# Patient Record
Sex: Female | Born: 1935 | Race: White | Hispanic: No | Smoking: Never smoker
Health system: Southern US, Community
[De-identification: ages and names within clinical notes are randomized; demographics above are authoritative.]

## PROBLEM LIST (undated history)

## (undated) DIAGNOSIS — I499 Cardiac arrhythmia, unspecified: Secondary | ICD-10-CM

## (undated) DIAGNOSIS — I358 Other nonrheumatic aortic valve disorders: Secondary | ICD-10-CM

## (undated) DIAGNOSIS — Z87442 Personal history of urinary calculi: Secondary | ICD-10-CM

## (undated) DIAGNOSIS — I509 Heart failure, unspecified: Secondary | ICD-10-CM

## (undated) DIAGNOSIS — H9190 Unspecified hearing loss, unspecified ear: Secondary | ICD-10-CM

## (undated) DIAGNOSIS — M489 Spondylopathy, unspecified: Secondary | ICD-10-CM

## (undated) DIAGNOSIS — I639 Cerebral infarction, unspecified: Secondary | ICD-10-CM

## (undated) DIAGNOSIS — D496 Neoplasm of unspecified behavior of brain: Secondary | ICD-10-CM

## (undated) DIAGNOSIS — I272 Pulmonary hypertension, unspecified: Secondary | ICD-10-CM

## (undated) DIAGNOSIS — R42 Dizziness and giddiness: Secondary | ICD-10-CM

## (undated) DIAGNOSIS — E785 Hyperlipidemia, unspecified: Secondary | ICD-10-CM

## (undated) DIAGNOSIS — C4491 Basal cell carcinoma of skin, unspecified: Secondary | ICD-10-CM

## (undated) DIAGNOSIS — I38 Endocarditis, valve unspecified: Secondary | ICD-10-CM

## (undated) DIAGNOSIS — K2210 Ulcer of esophagus without bleeding: Secondary | ICD-10-CM

## (undated) DIAGNOSIS — K55059 Acute (reversible) ischemia of intestine, part and extent unspecified: Secondary | ICD-10-CM

## (undated) DIAGNOSIS — M1990 Unspecified osteoarthritis, unspecified site: Secondary | ICD-10-CM

## (undated) DIAGNOSIS — D649 Anemia, unspecified: Secondary | ICD-10-CM

## (undated) DIAGNOSIS — K222 Esophageal obstruction: Secondary | ICD-10-CM

## (undated) DIAGNOSIS — Z931 Gastrostomy status: Secondary | ICD-10-CM

## (undated) DIAGNOSIS — E119 Type 2 diabetes mellitus without complications: Secondary | ICD-10-CM

## (undated) DIAGNOSIS — K219 Gastro-esophageal reflux disease without esophagitis: Secondary | ICD-10-CM

## (undated) DIAGNOSIS — N200 Calculus of kidney: Secondary | ICD-10-CM

## (undated) DIAGNOSIS — D329 Benign neoplasm of meninges, unspecified: Secondary | ICD-10-CM

## (undated) DIAGNOSIS — I10 Essential (primary) hypertension: Secondary | ICD-10-CM

## (undated) DIAGNOSIS — G2581 Restless legs syndrome: Secondary | ICD-10-CM

## (undated) HISTORY — PX: EYE SURGERY: SHX253

## (undated) HISTORY — PX: BACK SURGERY: SHX140

## (undated) HISTORY — PX: ABDOMINAL HYSTERECTOMY: SHX81

## (undated) HISTORY — DX: Unspecified hearing loss, unspecified ear: H91.90

## (undated) HISTORY — PX: ARTHROGRAM KNEE: SUR67

## (undated) HISTORY — PX: HYSTERECTOMY ABDOMINAL WITH SALPINGECTOMY: SHX6725

## (undated) HISTORY — DX: Cerebral infarction, unspecified: I63.9

## (undated) HISTORY — PX: ESOPHAGUS SURGERY: SHX626

## (undated) HISTORY — DX: Heart failure, unspecified: I50.9

## (undated) HISTORY — PX: APPENDECTOMY: SHX54

## (undated) SURGERY — Surgical Case
Anesthesia: *Unknown

---

## 2000-07-01 ENCOUNTER — Encounter: Payer: Self-pay | Admitting: Neurosurgery

## 2000-07-02 ENCOUNTER — Inpatient Hospital Stay (HOSPITAL_COMMUNITY): Admission: RE | Admit: 2000-07-02 | Discharge: 2000-07-03 | Payer: Self-pay | Admitting: Neurosurgery

## 2000-07-02 ENCOUNTER — Encounter: Payer: Self-pay | Admitting: Neurosurgery

## 2000-07-25 ENCOUNTER — Ambulatory Visit (HOSPITAL_COMMUNITY): Admission: RE | Admit: 2000-07-25 | Discharge: 2000-07-25 | Payer: Self-pay | Admitting: Neurosurgery

## 2000-07-25 ENCOUNTER — Encounter: Payer: Self-pay | Admitting: Neurosurgery

## 2005-02-16 ENCOUNTER — Ambulatory Visit: Payer: Self-pay | Admitting: Family Medicine

## 2005-06-20 ENCOUNTER — Ambulatory Visit: Payer: Self-pay | Admitting: Internal Medicine

## 2006-06-05 ENCOUNTER — Ambulatory Visit: Payer: Self-pay

## 2006-07-16 ENCOUNTER — Ambulatory Visit: Payer: Self-pay | Admitting: Internal Medicine

## 2007-05-09 ENCOUNTER — Ambulatory Visit: Payer: Self-pay | Admitting: Gastroenterology

## 2007-05-23 ENCOUNTER — Ambulatory Visit: Payer: Self-pay | Admitting: Gastroenterology

## 2007-07-24 ENCOUNTER — Ambulatory Visit: Payer: Self-pay | Admitting: Internal Medicine

## 2008-01-31 ENCOUNTER — Ambulatory Visit: Payer: Self-pay | Admitting: Internal Medicine

## 2008-06-08 ENCOUNTER — Ambulatory Visit: Payer: Self-pay | Admitting: Internal Medicine

## 2009-01-06 ENCOUNTER — Ambulatory Visit: Payer: Self-pay | Admitting: Internal Medicine

## 2010-01-09 ENCOUNTER — Ambulatory Visit: Payer: Self-pay | Admitting: Internal Medicine

## 2010-02-13 ENCOUNTER — Ambulatory Visit: Payer: Self-pay | Admitting: Internal Medicine

## 2011-04-16 ENCOUNTER — Ambulatory Visit: Payer: Self-pay | Admitting: Internal Medicine

## 2012-05-28 DIAGNOSIS — Z85828 Personal history of other malignant neoplasm of skin: Secondary | ICD-10-CM | POA: Insufficient documentation

## 2013-04-20 ENCOUNTER — Ambulatory Visit: Payer: Self-pay | Admitting: Internal Medicine

## 2013-07-27 ENCOUNTER — Ambulatory Visit: Payer: Self-pay | Admitting: Internal Medicine

## 2013-12-25 DIAGNOSIS — M1710 Unilateral primary osteoarthritis, unspecified knee: Secondary | ICD-10-CM | POA: Insufficient documentation

## 2013-12-25 DIAGNOSIS — E785 Hyperlipidemia, unspecified: Secondary | ICD-10-CM | POA: Insufficient documentation

## 2013-12-25 DIAGNOSIS — E119 Type 2 diabetes mellitus without complications: Secondary | ICD-10-CM | POA: Insufficient documentation

## 2013-12-25 DIAGNOSIS — I6789 Other cerebrovascular disease: Secondary | ICD-10-CM | POA: Insufficient documentation

## 2013-12-25 DIAGNOSIS — I10 Essential (primary) hypertension: Secondary | ICD-10-CM | POA: Insufficient documentation

## 2013-12-25 DIAGNOSIS — E1169 Type 2 diabetes mellitus with other specified complication: Secondary | ICD-10-CM | POA: Insufficient documentation

## 2014-02-19 DIAGNOSIS — I358 Other nonrheumatic aortic valve disorders: Secondary | ICD-10-CM | POA: Insufficient documentation

## 2014-12-13 DIAGNOSIS — R600 Localized edema: Secondary | ICD-10-CM | POA: Insufficient documentation

## 2014-12-13 DIAGNOSIS — M159 Polyosteoarthritis, unspecified: Secondary | ICD-10-CM | POA: Insufficient documentation

## 2014-12-23 ENCOUNTER — Ambulatory Visit: Payer: Self-pay | Admitting: Ophthalmology

## 2014-12-23 LAB — POTASSIUM: Potassium: 4.1 mmol/L

## 2014-12-23 LAB — HEMOGLOBIN: HGB: 12.3 g/dL (ref 12.0–16.0)

## 2015-01-04 DIAGNOSIS — I272 Pulmonary hypertension, unspecified: Secondary | ICD-10-CM | POA: Insufficient documentation

## 2015-01-06 ENCOUNTER — Ambulatory Visit
Admit: 2015-01-06 | Disposition: A | Discharge status: Home / Self Care | Payer: Self-pay | Attending: Ophthalmology | Admitting: Ophthalmology

## 2015-01-30 NOTE — Op Note (Signed)
PATIENT NAME:  [name] MR#:  [id number] DATE OF BIRTH:  07-29-1929  DATE OF PROCEDURE:  01/06/2015  PROCEDURE: Cataract surgery on the right eye.   ANESTHESIA: Topical.   PREOPERATIVE DIAGNOSIS:  Nuclear sclerotic cataract, right eye. Diagnosis code: H 25.11  POSTOPERATIVE DIAGNOSIS:  Nuclear sclerotic cataract, right eye. Diagnosis code: H 25.11  PROCEDURE:  Phacoemulsification with posterior chamber intraocular lens right eye, model SN60WF  SURGEON:  Lyla Glassing, MD  INDICATIONS:  This is an 81 year old female with decreased vision in the right eye.  PROCEDURE:  The risks and benefits of cataract surgery were discussed at length with the patient, including bleeding, infection, retinal detachment, re-operation, diplopia, ptosis, loss of vision, and loss of the eye. Informed consent was obtained. On the day of surgery, several sets of preoperative medication were administered to the operative eye including 0.5% tetracaine,1% cyclopentolate, 10% phenylephrine, 0.5% ketorolac, 0.5% gatifloxacin, and 2% lidocaine .  The patient was taken to the operating room and sedated via IV sedation. Topical tetracaine was placed in the eye. The operative eye was prepped using a dilute 10% Betadine solution and then covered in sterile drapes leaving only the operative eye exposed. A Lieberman lid speculum was placed to provide exposure. Using 0.12 forceps and a sideport blade, a paracentesis was created. Then a mixture of BSS, preservative free lidocaine, and epinephrine was injected into the anterior chamber. Next, a 2.4 mm keratome blade was used to create a two-step full-thickness clear corneal incision temporally. The cystitome and Utrata forceps were used to create a continuous capsulorrhexis in the anterior lens capsule. BSS on a hydrodissection cannula was used to perform gentle hydrodissection. Phacoemulsification was then performed to remove the nucleus. Irrigation and aspiration was performed  to remove the remaining cortical material. Provisc was injected to fill the capsular bag and anterior chamber. A 22.0-diopter SN60WF intraocular lens was injected into the capsular bag. The Connor wand was used to rotate it into proper position in the capsular bag. Irrigation and aspiration was performed to remove the remaining Viscoelastic material from the eye. BSS on a 30-gauge cannula was used to hydrate the wound. An intracameral antibiotic was administered. The wounds were checked and found to be watertight. The lid speculum and drapes were carefully removed. Several drops of Vigamox were placed in the operative eye. The eye was covered with protective eyewear. The patient was taken to the recovery area in good condition. There were no complications.    ____________________________ Lyla Glassing, MD nm:mw D: 01/06/2015 10:14:28 ET T: 01/06/2015 12:51:44 ET JOB#: [id number]  cc: Lyla Glassing, MD, <Dictator> Lyla Glassing MD ELECTRONICALLY SIGNED 01/10/2015 9:19

## 2015-03-14 ENCOUNTER — Ambulatory Visit: Payer: Medicare Other | Admitting: Anesthesiology

## 2015-03-14 ENCOUNTER — Encounter
Admission: RE | Disposition: A | Discharge status: Home / Self Care | Payer: Self-pay | Source: Ambulatory Visit | Attending: Gastroenterology

## 2015-03-14 ENCOUNTER — Ambulatory Visit
Admission: RE | Admit: 2015-03-14 | Discharge: 2015-03-14 | Disposition: A | Discharge status: Home / Self Care | Payer: Medicare Other | Source: Ambulatory Visit | Attending: Gastroenterology | Admitting: Gastroenterology

## 2015-03-14 DIAGNOSIS — R1013 Epigastric pain: Secondary | ICD-10-CM | POA: Diagnosis present

## 2015-03-14 DIAGNOSIS — Z8673 Personal history of transient ischemic attack (TIA), and cerebral infarction without residual deficits: Secondary | ICD-10-CM | POA: Insufficient documentation

## 2015-03-14 DIAGNOSIS — E119 Type 2 diabetes mellitus without complications: Secondary | ICD-10-CM | POA: Insufficient documentation

## 2015-03-14 DIAGNOSIS — K259 Gastric ulcer, unspecified as acute or chronic, without hemorrhage or perforation: Secondary | ICD-10-CM | POA: Diagnosis not present

## 2015-03-14 DIAGNOSIS — E785 Hyperlipidemia, unspecified: Secondary | ICD-10-CM | POA: Insufficient documentation

## 2015-03-14 DIAGNOSIS — R600 Localized edema: Secondary | ICD-10-CM | POA: Diagnosis not present

## 2015-03-14 DIAGNOSIS — K210 Gastro-esophageal reflux disease with esophagitis: Secondary | ICD-10-CM | POA: Diagnosis not present

## 2015-03-14 DIAGNOSIS — I272 Other secondary pulmonary hypertension: Secondary | ICD-10-CM | POA: Insufficient documentation

## 2015-03-14 DIAGNOSIS — Z87442 Personal history of urinary calculi: Secondary | ICD-10-CM | POA: Insufficient documentation

## 2015-03-14 DIAGNOSIS — D649 Anemia, unspecified: Secondary | ICD-10-CM | POA: Diagnosis not present

## 2015-03-14 DIAGNOSIS — Z85828 Personal history of other malignant neoplasm of skin: Secondary | ICD-10-CM | POA: Diagnosis not present

## 2015-03-14 DIAGNOSIS — M1990 Unspecified osteoarthritis, unspecified site: Secondary | ICD-10-CM | POA: Diagnosis not present

## 2015-03-14 HISTORY — DX: Neoplasm of unspecified behavior of brain: D49.6

## 2015-03-14 HISTORY — PX: ESOPHAGOGASTRODUODENOSCOPY (EGD) WITH PROPOFOL: SHX5813

## 2015-03-14 HISTORY — DX: Spondylopathy, unspecified: M48.9

## 2015-03-14 HISTORY — DX: Hyperlipidemia, unspecified: E78.5

## 2015-03-14 HISTORY — DX: Type 2 diabetes mellitus without complications: E11.9

## 2015-03-14 HISTORY — DX: Cardiac arrhythmia, unspecified: I49.9

## 2015-03-14 HISTORY — DX: Benign neoplasm of meninges, unspecified: D32.9

## 2015-03-14 HISTORY — DX: Calculus of kidney: N20.0

## 2015-03-14 HISTORY — DX: Essential (primary) hypertension: I10

## 2015-03-14 HISTORY — DX: Endocarditis, valve unspecified: I38

## 2015-03-14 HISTORY — DX: Anemia, unspecified: D64.9

## 2015-03-14 LAB — GLUCOSE, CAPILLARY: Glucose-Capillary: 103 mg/dL — ABNORMAL HIGH (ref 65–99)

## 2015-03-14 SURGERY — ESOPHAGOGASTRODUODENOSCOPY (EGD) WITH PROPOFOL
Anesthesia: General

## 2015-03-14 SURGERY — Surgical Case
Anesthesia: *Unknown

## 2015-03-14 MED ORDER — FENTANYL CITRATE (PF) 100 MCG/2ML IJ SOLN
INTRAMUSCULAR | Status: DC | PRN
  Administered 2015-03-14: 30 ug via INTRAVENOUS
  Administered 2015-03-14: 50 ug via INTRAVENOUS
  Administered 2015-03-14: 30 ug via INTRAVENOUS

## 2015-03-14 MED ORDER — METOPROLOL SUCCINATE ER 50 MG PO TB24
50.0000 mg | ORAL_TABLET | Freq: Once | ORAL | Status: AC
  Administered 2015-03-14: 50 mg via ORAL

## 2015-03-14 MED ORDER — MIDAZOLAM HCL 5 MG/5ML IJ SOLN: INTRAMUSCULAR | Status: DC | PRN

## 2015-03-14 MED ORDER — PROPOFOL 10 MG/ML IV BOLUS
INTRAVENOUS | Status: DC | PRN
  Administered 2015-03-14: 50 mg via INTRAVENOUS

## 2015-03-14 MED ORDER — SODIUM CHLORIDE 0.9 % IV SOLN
INTRAVENOUS | Status: DC
  Administered 2015-03-14: 1000 mL via INTRAVENOUS

## 2015-03-14 MED ORDER — SODIUM CHLORIDE 0.9 % IV SOLN: INTRAVENOUS | Status: DC

## 2015-03-14 MED ORDER — METOPROLOL SUCCINATE ER 50 MG PO TB24
ORAL_TABLET | ORAL | Status: AC
  Filled 2015-03-14: qty 1

## 2015-03-14 MED ORDER — MIDAZOLAM HCL 5 MG/5ML IJ SOLN
INTRAMUSCULAR | Status: DC | PRN
  Administered 2015-03-14: 1 mg via INTRAVENOUS

## 2015-03-14 NOTE — Anesthesia Postprocedure Evaluation (Signed)
  Anesthesia Post-op Note  Patient: [name] Day  Procedure(s) Performed: Procedure(s): ESOPHAGOGASTRODUODENOSCOPY (EGD) WITH PROPOFOL (N/A)  Anesthesia type:General  Patient location: PACU  Post pain: Pain level controlled  Post assessment: Post-op Vital signs reviewed, Patient's Cardiovascular Status Stable, Respiratory Function Stable, Patent Airway and No signs of Nausea or vomiting  Post vital signs: Reviewed and stable  Last Vitals:  Filed Vitals:   03/14/15 0753  BP: 182/55  Pulse: 59  Temp: 36.4 C  Resp: 16    Level of consciousness: awake, alert  and patient cooperative  Complications: No apparent anesthesia complications

## 2015-03-14 NOTE — H&P (Signed)
Primary Care Physician:  [name], MD  Pre-Procedure History & Physical: HPI:  [name] [name] is a 81 y.o. female is here for an endoscopy.   Past Medical History  Diagnosis Date  . Hypertension   . Diabetes mellitus without complication   . Kidney stones   . Hyperlipemia   . Cervical spine disease   . Meningioma   . Brain tumor   . Arrhythmia   . Leaky heart valve   . Brain tumor   . Anemia     Past Surgical History  Procedure Laterality Date  . Abdominal hysterectomy    . Eye surgery      Prior to Admission medications   Medication Sig Start Date End Date Taking? Authorizing Provider  acetaminophen (TYLENOL) 650 MG CR tablet Take 650 mg by mouth every 8 (eight) hours as needed for pain.   Yes Historical Provider, MD  aspirin 81 MG tablet Take 81 mg by mouth daily.   Yes Historical Provider, MD  Calcium Carbonate-Vitamin D (CALCIUM + D PO) Take 1 tablet by mouth daily.   Yes Historical Provider, MD  ferrous sulfate 325 (65 FE) MG tablet Take 325 mg by mouth daily with breakfast.   Yes Historical Provider, MD  furosemide (LASIX) 20 MG tablet Take 1 tablet by mouth daily. 01/03/15  Yes Historical Provider, MD  glipiZIDE (GLUCOTROL) 10 MG tablet Take 1 tablet by mouth 2 (two) times daily. 03/01/15  Yes Historical Provider, MD  HYDROcodone-acetaminophen (NORCO/VICODIN) 5-325 MG per tablet take 1 tablet by mouth once daily if needed for pain 12/14/14  Yes Historical Provider, MD  lisinopril (PRINIVIL,ZESTRIL) 40 MG tablet Take 1 tablet by mouth daily. 03/01/15  Yes Historical Provider, MD  meloxicam (MOBIC) 15 MG tablet Take 1 tablet by mouth daily. 03/01/15  Yes Historical Provider, MD  metFORMIN (GLUCOPHAGE) 500 MG tablet Take 500 mg by mouth 2 (two) times daily.  02/12/15  Yes Historical Provider, MD  metoprolol succinate (TOPROL-XL) 50 MG 24 hr tablet Take 1 tablet by mouth daily. 02/24/15  Yes Historical Provider, MD  omeprazole (PRILOSEC) 40 MG capsule Take 40 mg by  mouth daily. 03/09/15  Yes Historical Provider, MD  rOPINIRole (REQUIP) 2 MG tablet Take 2 mg by mouth 2 (two) times daily.  02/24/15  Yes Historical Provider, MD  vitamin B-12 (CYANOCOBALAMIN) 1000 MCG tablet Take 1,000 mcg by mouth daily.   Yes Historical Provider, MD  zolpidem (AMBIEN) 5 MG tablet Take 5 mg by mouth at bedtime as needed for sleep.   Yes Historical Provider, MD    Allergies as of 03/10/2015  . (Not on File)    History reviewed. No pertinent family history.  History   Social History  . Marital Status: Widowed    Spouse Name: N/A  . Number of Children: N/A  . Years of Education: N/A   Occupational History  . Not on file.   Social History Main Topics  . Smoking status: Not on file  . Smokeless tobacco: Not on file  . Alcohol Use: Not on file  . Drug Use: Not on file  . Sexual Activity: Not on file   Other Topics Concern  . Not on file   Social History Narrative  . No narrative on file     Physical Exam: BP 182/55 mmHg  Pulse 59  Temp(Src) 97.6 F (36.4 C) (Tympanic)  Resp 16  Ht 5\' 3"  (1.6 m)  Wt 125 lb (56.7 kg)  BMI 22.15 kg/m2  SpO2  100% General:   Alert,  pleasant and cooperative in NAD Head:  Normocephalic and atraumatic. Neck:  Supple; no masses or thyromegaly. Lungs:  Clear throughout to auscultation.    Heart:  Regular rate and rhythm. Abdomen:  Soft, nontender and nondistended. Normal bowel sounds, without guarding, and without rebound.   Neurologic:  Alert and  oriented x4;  grossly normal neurologically.  Impression/Plan: [name] is here for an endoscopy to be performed for dysphagia, epi pain, GERD  Risks, benefits, limitations, and alternatives regarding  endoscopy have been reviewed with the patient.  Questions have been answered.  All parties agreeable.   Josefine Class, MD  03/14/2015, 8:38 AM

## 2015-03-14 NOTE — Op Note (Signed)
[facility] Gastroenterology Patient Name: [name] Procedure Date: 03/14/2015 8:25 AM MRN: [id number] Account #: [id number] Date of Birth: 04-19-1929 Admit Type: Outpatient Age: 81 Room: [facility] ENDO ROOM 2 Gender: Female Note Status: Finalized Procedure:         Upper GI endoscopy Indications:       Epigastric abdominal pain, Dysphagia, Heartburn Patient Profile:   This is an 81 year old female. Providers:         Gerrit Heck. Rayann Heman, MD Referring MD:      Loreli Dollar, MD (Referring MD) Medicines:         Propofol per Anesthesia Complications:     No immediate complications. Procedure:         Pre-Anesthesia Assessment:                    - Prior to the procedure, a History and Physical was                     performed, and patient medications, allergies and                     sensitivities were reviewed. The patient's tolerance of                     previous anesthesia was reviewed.                    After obtaining informed consent, the endoscope was passed                     under direct vision. Throughout the procedure, the                     patient's blood pressure, pulse, and oxygen saturations                     were monitored continuously. The Olympus GIF H180J                     colonscope (X#:[id number]) was introduced through the mouth,                     and advanced to the second part of duodenum. The upper GI                     endoscopy was accomplished without difficulty. The patient                     tolerated the procedure well. Findings:      LA Grade A (one or more mucosal breaks less than 5 mm, not extending       between tops of 2 mucosal folds) esophagitis was found at the       gastroesophageal junction.      - Pill sitting at GEJ, passed with air. A TTS dilator was passed through       the scope. Dilation with a 15-16.5-18 mm x 5.5 cm CRE balloon (to a       maximum balloon size of 18 mm) dilator was performed.  Few superficial gastric ulcers were found in the gastric antrum. The       largest lesion was 3 mm in largest dimension. Biopsies were taken with a       cold forceps for histology.      The  examined duodenum was normal. Impression:        - Pill sitting at GEJ, passed with air.                    - LA Grade A reflux esophagitis. Dilated.                    - Gastric ulcers. Biopsied.                    - Normal examined duodenum. Recommendation:    - Observe patient in GI recovery unit.                    - Resume regular diet.                    - Continue present medications. Continue prilosec 40 mg                     daily, 30 min before breakfast                    - Avoid NSAIDS                    - Repeat the upper endoscopy PRN for retreatment.                    - Await pathology results.                    - Return to referring physician.                    - The findings and recommendations were discussed with the                     patient.                    - The findings and recommendations were discussed with the                     patient's family. Procedure Code(s): --- Professional ---                    [phone], Esophagogastroduodenoscopy, flexible, transoral;                     with biopsy, single or multiple CPT copyright 2014 American Medical Association. All rights reserved. The codes documented in this report are preliminary and upon coder review may  be revised to meet current compliance requirements. Mellody Life, MD 03/14/2015 9:03:45 AM This report has been signed electronically. Number of Addenda: 0 Note Initiated On: 03/14/2015 8:25 AM      [facility]

## 2015-03-14 NOTE — Transfer of Care (Signed)
Immediate Anesthesia Transfer of Care Note  Patient: [name]  Procedure(s) Performed: Procedure(s): ESOPHAGOGASTRODUODENOSCOPY (EGD) WITH PROPOFOL (N/A)  Patient Location: PACU  Anesthesia Type:General  Level of Consciousness: awake and alert   Airway & Oxygen Therapy: Patient Spontanous Breathing  Post-op Assessment: Report given to RN  Post vital signs: stable  Last Vitals:  Filed Vitals:   03/14/15 0753  BP: 182/55  Pulse: 59  Temp: 36.4 C  Resp: 16    Complications: No apparent anesthesia complications

## 2015-03-14 NOTE — Discharge Instructions (Signed)

## 2015-03-14 NOTE — Anesthesia Preprocedure Evaluation (Addendum)
Anesthesia Evaluation    Airway Mallampati: II  TM Distance: >3 FB Neck ROM: Full    Dental  (+) Teeth Intact, Chipped   Pulmonary          Cardiovascular hypertension, Pt. on medications and Pt. on home beta blockers     Neuro/Psych    GI/Hepatic GERD-  Medicated,  Endo/Other  diabetes, Type 2, Oral Hypoglycemic Agents  Renal/GU      Musculoskeletal   Abdominal   Peds  Hematology  (+) anemia ,   Anesthesia Other Findings   Reproductive/Obstetrics                            Anesthesia Physical Anesthesia Plan  ASA: III  Anesthesia Plan: General   Post-op Pain Management:    Induction: Intravenous  Airway Management Planned: Nasal Cannula  Additional Equipment:   Intra-op Plan:   Post-operative Plan:   Informed Consent: I have reviewed the patients History and Physical, chart, labs and discussed the procedure including the risks, benefits and alternatives for the proposed anesthesia with the patient or authorized representative who has indicated his/her understanding and acceptance.     Plan Discussed with:   Anesthesia Plan Comments:         Anesthesia Quick Evaluation

## 2015-03-16 LAB — SURGICAL PATHOLOGY

## 2015-05-05 ENCOUNTER — Encounter: Payer: Self-pay | Admitting: Gastroenterology

## 2015-11-08 ENCOUNTER — Other Ambulatory Visit: Payer: Self-pay | Admitting: Student

## 2015-11-08 DIAGNOSIS — R1310 Dysphagia, unspecified: Secondary | ICD-10-CM

## 2015-11-15 ENCOUNTER — Ambulatory Visit
Admission: RE | Admit: 2015-11-15 | Discharge: 2015-11-15 | Disposition: A | Discharge status: Home / Self Care | Payer: Medicare Other | Source: Ambulatory Visit | Attending: Student | Admitting: Student

## 2015-11-15 DIAGNOSIS — R1310 Dysphagia, unspecified: Secondary | ICD-10-CM

## 2015-12-15 ENCOUNTER — Other Ambulatory Visit: Payer: Self-pay | Admitting: Internal Medicine

## 2015-12-15 DIAGNOSIS — R1013 Epigastric pain: Secondary | ICD-10-CM

## 2015-12-15 DIAGNOSIS — I10 Essential (primary) hypertension: Secondary | ICD-10-CM

## 2015-12-15 DIAGNOSIS — R110 Nausea: Secondary | ICD-10-CM

## 2015-12-22 ENCOUNTER — Ambulatory Visit
Admission: RE | Admit: 2015-12-22 | Discharge: 2015-12-22 | Disposition: A | Discharge status: Home / Self Care | Payer: Medicare Other | Source: Ambulatory Visit | Attending: Internal Medicine | Admitting: Internal Medicine

## 2015-12-22 DIAGNOSIS — I2510 Atherosclerotic heart disease of native coronary artery without angina pectoris: Secondary | ICD-10-CM | POA: Insufficient documentation

## 2015-12-22 DIAGNOSIS — R110 Nausea: Secondary | ICD-10-CM

## 2015-12-22 DIAGNOSIS — I774 Celiac artery compression syndrome: Secondary | ICD-10-CM | POA: Insufficient documentation

## 2015-12-22 DIAGNOSIS — K551 Chronic vascular disorders of intestine: Secondary | ICD-10-CM | POA: Diagnosis not present

## 2015-12-22 DIAGNOSIS — N200 Calculus of kidney: Secondary | ICD-10-CM | POA: Diagnosis not present

## 2015-12-22 DIAGNOSIS — I10 Essential (primary) hypertension: Secondary | ICD-10-CM | POA: Diagnosis present

## 2015-12-22 DIAGNOSIS — R1013 Epigastric pain: Secondary | ICD-10-CM | POA: Diagnosis present

## 2015-12-22 HISTORY — DX: Basal cell carcinoma of skin, unspecified: C44.91

## 2015-12-22 MED ORDER — IOPAMIDOL (ISOVUE-370) INJECTION 76%
100.0000 mL | Freq: Once | INTRAVENOUS | Status: AC | PRN
  Administered 2015-12-22: 100 mL via INTRAVENOUS

## 2016-01-19 ENCOUNTER — Other Ambulatory Visit: Payer: Self-pay | Admitting: Vascular Surgery

## 2016-01-23 ENCOUNTER — Encounter: Payer: Self-pay | Admitting: *Deleted

## 2016-01-23 ENCOUNTER — Ambulatory Visit
Admission: RE | Admit: 2016-01-23 | Discharge: 2016-01-23 | Disposition: A | Discharge status: Home / Self Care | Payer: Medicare Other | Source: Ambulatory Visit | Attending: Vascular Surgery | Admitting: Vascular Surgery

## 2016-01-23 ENCOUNTER — Encounter
Admission: RE | Disposition: A | Discharge status: Home / Self Care | Payer: Self-pay | Source: Ambulatory Visit | Attending: Vascular Surgery

## 2016-01-23 DIAGNOSIS — I499 Cardiac arrhythmia, unspecified: Secondary | ICD-10-CM | POA: Diagnosis not present

## 2016-01-23 DIAGNOSIS — I10 Essential (primary) hypertension: Secondary | ICD-10-CM | POA: Insufficient documentation

## 2016-01-23 DIAGNOSIS — Z8249 Family history of ischemic heart disease and other diseases of the circulatory system: Secondary | ICD-10-CM | POA: Diagnosis not present

## 2016-01-23 DIAGNOSIS — Z7984 Long term (current) use of oral hypoglycemic drugs: Secondary | ICD-10-CM | POA: Diagnosis not present

## 2016-01-23 DIAGNOSIS — I774 Celiac artery compression syndrome: Secondary | ICD-10-CM | POA: Diagnosis present

## 2016-01-23 DIAGNOSIS — R109 Unspecified abdominal pain: Secondary | ICD-10-CM | POA: Insufficient documentation

## 2016-01-23 DIAGNOSIS — Z9889 Other specified postprocedural states: Secondary | ICD-10-CM | POA: Diagnosis not present

## 2016-01-23 DIAGNOSIS — M792 Neuralgia and neuritis, unspecified: Secondary | ICD-10-CM | POA: Diagnosis not present

## 2016-01-23 DIAGNOSIS — I739 Peripheral vascular disease, unspecified: Secondary | ICD-10-CM | POA: Diagnosis not present

## 2016-01-23 DIAGNOSIS — Z809 Family history of malignant neoplasm, unspecified: Secondary | ICD-10-CM | POA: Insufficient documentation

## 2016-01-23 DIAGNOSIS — M7989 Other specified soft tissue disorders: Secondary | ICD-10-CM | POA: Insufficient documentation

## 2016-01-23 DIAGNOSIS — I70219 Atherosclerosis of native arteries of extremities with intermittent claudication, unspecified extremity: Secondary | ICD-10-CM | POA: Diagnosis not present

## 2016-01-23 DIAGNOSIS — M79609 Pain in unspecified limb: Secondary | ICD-10-CM | POA: Diagnosis not present

## 2016-01-23 DIAGNOSIS — Z888 Allergy status to other drugs, medicaments and biological substances status: Secondary | ICD-10-CM | POA: Insufficient documentation

## 2016-01-23 DIAGNOSIS — Z7982 Long term (current) use of aspirin: Secondary | ICD-10-CM | POA: Diagnosis not present

## 2016-01-23 DIAGNOSIS — I771 Stricture of artery: Secondary | ICD-10-CM | POA: Diagnosis present

## 2016-01-23 DIAGNOSIS — G589 Mononeuropathy, unspecified: Secondary | ICD-10-CM | POA: Insufficient documentation

## 2016-01-23 DIAGNOSIS — Z883 Allergy status to other anti-infective agents status: Secondary | ICD-10-CM | POA: Insufficient documentation

## 2016-01-23 DIAGNOSIS — Z90710 Acquired absence of both cervix and uterus: Secondary | ICD-10-CM | POA: Diagnosis not present

## 2016-01-23 DIAGNOSIS — E119 Type 2 diabetes mellitus without complications: Secondary | ICD-10-CM | POA: Insufficient documentation

## 2016-01-23 DIAGNOSIS — Z79899 Other long term (current) drug therapy: Secondary | ICD-10-CM | POA: Diagnosis not present

## 2016-01-23 HISTORY — PX: PERIPHERAL VASCULAR CATHETERIZATION: SHX172C

## 2016-01-23 LAB — BUN: BUN: 16 mg/dL (ref 6–20)

## 2016-01-23 LAB — CREATININE, SERUM
Creatinine, Ser: 0.61 mg/dL (ref 0.44–1.00)
GFR calc Af Amer: >60 mL/min (ref >60)
GFR calc non Af Amer: >60 mL/min (ref >60)

## 2016-01-23 SURGERY — VISCERAL VENOGRAPHY
Anesthesia: Moderate Sedation

## 2016-01-23 MED ORDER — HYDRALAZINE HCL 20 MG/ML IJ SOLN
INTRAMUSCULAR | Status: DC | PRN
  Administered 2016-01-23 (×2): 10 mg via INTRAVENOUS

## 2016-01-23 MED ORDER — FENTANYL CITRATE (PF) 100 MCG/2ML IJ SOLN
INTRAMUSCULAR | Status: AC
  Filled 2016-01-23: qty 2

## 2016-01-23 MED ORDER — OXYCODONE-ACETAMINOPHEN 5-325 MG PO TABS: 1.0000 | ORAL_TABLET | ORAL | Status: DC | PRN

## 2016-01-23 MED ORDER — ONDANSETRON HCL 4 MG/2ML IJ SOLN: 4.0000 mg | Freq: Four times a day (QID) | INTRAMUSCULAR | Status: DC | PRN

## 2016-01-23 MED ORDER — FAMOTIDINE 20 MG PO TABS: 40.0000 mg | ORAL_TABLET | ORAL | Status: DC | PRN

## 2016-01-23 MED ORDER — ACETAMINOPHEN 325 MG PO TABS
325.0000 mg | ORAL_TABLET | ORAL | Status: DC | PRN
Start: 2016-01-23 — End: 2016-01-23

## 2016-01-23 MED ORDER — ACETAMINOPHEN 325 MG RE SUPP
325.0000 mg | RECTAL | Status: DC | PRN
  Filled 2016-01-23: qty 2

## 2016-01-23 MED ORDER — HYDRALAZINE HCL 20 MG/ML IJ SOLN: 5.0000 mg | INTRAMUSCULAR | Status: DC | PRN

## 2016-01-23 MED ORDER — LIDOCAINE-EPINEPHRINE (PF) 1 %-1:200000 IJ SOLN
INTRAMUSCULAR | Status: DC | PRN
  Administered 2016-01-23: 10 mL via INTRADERMAL

## 2016-01-23 MED ORDER — MIDAZOLAM HCL 2 MG/2ML IJ SOLN
INTRAMUSCULAR | Status: DC | PRN
  Administered 2016-01-23: 1 mg via INTRAVENOUS
  Administered 2016-01-23: 2 mg via INTRAVENOUS

## 2016-01-23 MED ORDER — SODIUM CHLORIDE 0.9 % IV SOLN
INTRAVENOUS | Status: DC
  Administered 2016-01-23: 08:00:00 via INTRAVENOUS

## 2016-01-23 MED ORDER — HYDRALAZINE HCL 20 MG/ML IJ SOLN
INTRAMUSCULAR | Status: AC
  Filled 2016-01-23: qty 1

## 2016-01-23 MED ORDER — HEPARIN SODIUM (PORCINE) 1000 UNIT/ML IJ SOLN
INTRAMUSCULAR | Status: AC
  Filled 2016-01-23: qty 1

## 2016-01-23 MED ORDER — HEPARIN SODIUM (PORCINE) 1000 UNIT/ML IJ SOLN
INTRAMUSCULAR | Status: DC | PRN
  Administered 2016-01-23: 4500 [IU] via INTRAVENOUS

## 2016-01-23 MED ORDER — FENTANYL CITRATE (PF) 100 MCG/2ML IJ SOLN
INTRAMUSCULAR | Status: DC | PRN
  Administered 2016-01-23 (×2): 50 ug via INTRAVENOUS

## 2016-01-23 MED ORDER — HYDROMORPHONE HCL 1 MG/ML IJ SOLN: 1.0000 mg | Freq: Once | INTRAMUSCULAR | Status: DC

## 2016-01-23 MED ORDER — MIDAZOLAM HCL 5 MG/5ML IJ SOLN
INTRAMUSCULAR | Status: AC
  Filled 2016-01-23: qty 5

## 2016-01-23 MED ORDER — METOPROLOL TARTRATE 1 MG/ML IV SOLN: 2.0000 mg | INTRAVENOUS | Status: DC | PRN

## 2016-01-23 MED ORDER — SODIUM CHLORIDE 0.9 % IV SOLN: 500.0000 mL | Freq: Once | INTRAVENOUS | Status: DC | PRN

## 2016-01-23 MED ORDER — DEXTROSE 5 % IV SOLN
1.5000 g | INTRAVENOUS | Status: AC
  Administered 2016-01-23: 1.5 g via INTRAVENOUS

## 2016-01-23 MED ORDER — IOPAMIDOL (ISOVUE-370) INJECTION 76%
INTRAVENOUS | Status: DC | PRN
  Administered 2016-01-23: 50 mL via INTRA_ARTERIAL

## 2016-01-23 MED ORDER — HYDROMORPHONE HCL 1 MG/ML IJ SOLN: 0.5000 mg | INTRAMUSCULAR | Status: DC | PRN

## 2016-01-23 MED ORDER — LIDOCAINE-EPINEPHRINE (PF) 1 %-1:200000 IJ SOLN
INTRAMUSCULAR | Status: AC
  Filled 2016-01-23: qty 30

## 2016-01-23 MED ORDER — CLOPIDOGREL BISULFATE 75 MG PO TABS: 75.0000 mg | ORAL_TABLET | Freq: Every day | ORAL | Status: DC

## 2016-01-23 MED ORDER — GUAIFENESIN-DM 100-10 MG/5ML PO SYRP
15.0000 mL | ORAL_SOLUTION | ORAL | Status: DC | PRN
  Filled 2016-01-23: qty 15

## 2016-01-23 MED ORDER — METHYLPREDNISOLONE SODIUM SUCC 125 MG IJ SOLR: 125.0000 mg | INTRAMUSCULAR | Status: DC | PRN

## 2016-01-23 MED ORDER — DEXTROSE 5 % IV SOLN
INTRAVENOUS | Status: AC
  Filled 2016-01-23 (×52): qty 1.5

## 2016-01-23 MED ORDER — CLOPIDOGREL BISULFATE 75 MG PO TABS
75.0000 mg | ORAL_TABLET | Freq: Every day | ORAL | Status: DC
Start: 2016-01-23 — End: 2016-01-23
  Administered 2016-01-23: 75 mg via ORAL

## 2016-01-23 MED ORDER — PHENOL 1.4 % MT LIQD: 1.0000 | OROMUCOSAL | Status: DC | PRN

## 2016-01-23 MED ORDER — LABETALOL HCL 5 MG/ML IV SOLN
10.0000 mg | INTRAVENOUS | Status: DC | PRN
Start: 2016-01-23 — End: 2016-01-23

## 2016-01-23 MED ORDER — CLOPIDOGREL BISULFATE 75 MG PO TABS
ORAL_TABLET | ORAL | Status: AC
  Administered 2016-01-23: 75 mg via ORAL
  Filled 2016-01-23: qty 1

## 2016-01-23 SURGICAL SUPPLY — 19 items
BALLN ARMADA 5X40X80 (BALLOONS) ×3
BALLN ULTRVRSE 5X20X75C (BALLOONS) ×3
BALLOON ARMADA 5X40X80 (BALLOONS) ×2 IMPLANT
BALLOON ULTRVRSE 5X20X75C (BALLOONS) ×2 IMPLANT
CATH 5FR REUT (CATHETERS) ×3 IMPLANT
CATH LIMA 6F (CATHETERS) ×3 IMPLANT
CATH PIG 70CM (CATHETERS) ×3 IMPLANT
DEVICE PRESTO INFLATION (MISCELLANEOUS) ×3 IMPLANT
DEVICE STARCLOSE SE CLOSURE (Vascular Products) ×3 IMPLANT
GLIDEWIRE ADV .035X180CM (WIRE) ×3 IMPLANT
GUIDEWIRE ANGLED .035 180CM (WIRE) ×3 IMPLANT
PACK ANGIOGRAPHY (CUSTOM PROCEDURE TRAY) ×3 IMPLANT
SHEATH BRITE TIP 5FRX11 (SHEATH) ×3 IMPLANT
SHEATH BRITE TIP 6FRX11 (SHEATH) ×3 IMPLANT
STENT HERCULINK RX 6.5X18X135 (Permanent Stent) ×3 IMPLANT
VALVE HEMO TOUHY BORST Y (VALVE) ×3 IMPLANT
WIRE BENTSON .035X145CM (WIRE) ×3 IMPLANT
WIRE MAGIC TOR.035 180C (WIRE) ×3 IMPLANT
WIRE SPARTACORE .014X190CM (WIRE) ×3 IMPLANT

## 2016-01-23 NOTE — H&P (Signed)
  [facility] History & Physical Update  The patient was interviewed and re-examined.  The patient's previous History and Physical has been reviewed and is unchanged.  There is no change in the plan of care. We plan to proceed with the scheduled procedure.  DEW,JASON, MD  01/23/2016, 9:05 AM

## 2016-01-23 NOTE — Op Note (Signed)
[facility] Percutaneous Study/Intervention Procedural Note   Date: 01/23/2016  Surgeon(s): Dejean Tribby, MD  Assistants: none  Pre-operative Diagnosis: 1.  Chronic Mesenteric ischemia 2. Celiac and SMA stenosis   Post-operative diagnosis: Same  Procedure(s) Performed: 1. Ultrasound guidance for vascular access right femoral artery  2. Catheter placement into celiac artery and superior mesenteric artery from right femoral approach 3. Aortogram and selective angiogram of the celiac and superior mesenteric artery  4.  Percutaneous transluminal angioplasty of the superior mesenteric artery with a 5 mm diameter x 40 mm length angioplasty balloon 5. Stent to the superior mesenteric artery with 6.5 mm diameter x 18 mm length balloon expandable stent 6. StarClose closure device right femoral artery  Contrast: 50 cc  Fluoro time: 8.5 minutes  EBL: 40 cc  Moderate Conscious Sedation Time: Approximate 45 minutes using 3 mg of Versed and 100 g fentanyl  Indications: Patient is a 82 y.o. female who has symptoms consistent with mesenteric ischemia. The patient has a CT scan showing a highly calcific aorta and branch vessels but the degree of stenosis was difficult to discern. Duplex suggesting greater than 70% stenosis of the celiac and superior mesenteric artery. The patient is brought in for angiography for further evaluation and potential treatment. Risks and benefits are discussed and informed consent is obtained  Procedure: The patient was identified and appropriate procedural time out was performed. The patient was then placed supine on the table and prepped and draped in the usual sterile fashion.Moderate conscious sedation was administered through a face-to-face encounter with the patient with the RN supervising the patient's vital signs, mental status, telemetry, and pulse oximetry  throughout the entire procedure Ultrasound was used to evaluate the right common femoral artery. It was patent . A digital ultrasound image was acquired. A Seldinger needle was used to access the right common femoral artery under direct ultrasound guidance and a permanent image was performed. A 0.035 J wire was advanced without resistance and a 5Fr sheath was placed. Pigtail catheter was placed into the aorta and an AP aortogram was performed. This demonstrated what appeared to be a greater than 60% left renal artery stenosis and a normal right renal artery with a highly calcific aorta. Flow was seen in the celiac and superior mesenteric artery, but in the AP projection determination of stenosis was impossible. We transitioned to the lateral projection to image the celiac and SMA. The lateral image demonstrated what appeared to be significant stenosis of both the celiac and SMA, although initial imaging without selective catheterization was poor. The patient was given 4500 units of IV heparin.We upsized to a 6 Fr sheath.  A VS 1 catheter was used to selectively cannulate the celiac artery. This demonstrated a 75-805 stenosis of the celiac artery proximally. I then removed the catheter and used the same diagnostic catheter to selectively cannulate the superior mesenteric artery. This demonstrated an even tighter stenosis in the 85-90% range. Given the fact that this was the more important vessel with the tighter stenosis, this would be our target vessel. Based on her symptoms and these findings, I elected to treat the SMA to try to improve the patient's clinical course. I crossed the lesion without difficulty with a Magic torque wire and then exchanged for a 6 [place] IM guide catheter. I used a 5 mm diameter x 40 mm length angioplasty balloon and inflated the balloon to 12 atm in the proximal SMA for one minute.  Completion angiogram demonstrated greater than 50%  residual stenosis, so I elected to proceed  with stent placement. I exchanged for a 0.014 wire. I then used a 6.5 mm diameter x 18 mm length balloon expandable stent to perform treatment of the proximal superior mesenteric artery. I inflated the balloon to 12 atm. On completion angiogram following this, 10-15 % residual stenosis was identified. At this point, I elected to terminate the procedure. The diagnostic catheter was removed. StarClose closure device was deployed in usual fashion with excellent hemostatic result. The patient was taken to the recovery room in stable condition having tolerated the procedure well.     Findings:Highly calcific aorta. Greater than 60% left renal artery stenosis. Patent right renal artery without stenosis. 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 85-90% stenosis of the superior mesenteric artery which was improved with intervention  Disposition: Patient was taken to the recovery room in stable condition having tolerated the procedure well.  Complications: None  Glory Graefe 01/23/2016 12:35 PM

## 2016-01-23 NOTE — Discharge Instructions (Signed)
Angiogram, Care After °Refer to this sheet in the next few weeks. These instructions provide you with information about caring for yourself after your procedure. Your health care provider may also give you more specific instructions. Your treatment has been planned according to current medical practices, but problems sometimes occur. Call your health care provider if you have any problems or questions after your procedure. °WHAT TO EXPECT AFTER THE PROCEDURE °After your procedure, it is typical to have the following: °· Bruising at the catheter insertion site that usually fades within 1-2 weeks. °· Blood collecting in the tissue (hematoma) that may be painful to the touch. It should usually decrease in size and tenderness within 1-2 weeks. °HOME CARE INSTRUCTIONS °· Take medicines only as directed by your health care provider. °· You may shower 24-48 hours after the procedure or as directed by your health care provider. Remove the bandage (dressing) and gently wash the site with plain soap and water. Pat the area dry with a clean towel. Do not rub the site, because this may cause bleeding. °· Do not take baths, swim, or use a hot tub until your health care provider approves. °· Check your insertion site every day for redness, swelling, or drainage. °· Do not apply powder or lotion to the site. °· Do not lift over 10 lb (4.5 kg) for 5 days after your procedure or as directed by your health care provider. °· Ask your health care provider when it is okay to: °¨ Return to work or school. °¨ Resume usual physical activities or sports. °¨ Resume sexual activity. °· Do not drive home if you are discharged the same day as the procedure. Have someone else drive you. °· You may drive 24 hours after the procedure unless otherwise instructed by your health care provider. °· Do not operate machinery or power tools for 24 hours after the procedure or as directed by your health care provider. °· If your procedure was done as an  outpatient procedure, which means that you went home the same day as your procedure, a responsible adult should be with you for the first 24 hours after you arrive home. °· Keep all follow-up visits as directed by your health care provider. This is important. °SEEK MEDICAL CARE IF: °· You have a fever. °· You have chills. °· You have increased bleeding from the catheter insertion site. Hold pressure on the site. °SEEK IMMEDIATE MEDICAL CARE IF: °· You have unusual pain at the catheter insertion site. °· You have redness, warmth, or swelling at the catheter insertion site. °· You have drainage (other than a small amount of blood on the dressing) from the catheter insertion site. °· The catheter insertion site is bleeding, and the bleeding does not stop after 30 minutes of holding steady pressure on the site. °· The area near or just beyond the catheter insertion site becomes pale, cool, tingly, or numb. °  °This information is not intended to replace advice given to you by your health care provider. Make sure you discuss any questions you have with your health care provider. °  °Document Released: 04/05/2005 Document Revised: 10/08/2014 Document Reviewed: 02/18/2013 °Elsevier Interactive Patient Education ©2016 [employer] ° °

## 2016-01-24 ENCOUNTER — Encounter: Payer: Self-pay | Admitting: Vascular Surgery

## 2016-04-04 DIAGNOSIS — E7801 Familial hypercholesterolemia: Secondary | ICD-10-CM | POA: Insufficient documentation

## 2016-11-27 ENCOUNTER — Other Ambulatory Visit (INDEPENDENT_AMBULATORY_CARE_PROVIDER_SITE_OTHER): Payer: Self-pay | Admitting: Vascular Surgery

## 2016-11-27 DIAGNOSIS — I771 Stricture of artery: Principal | ICD-10-CM

## 2016-11-27 DIAGNOSIS — K551 Chronic vascular disorders of intestine: Secondary | ICD-10-CM

## 2016-11-28 ENCOUNTER — Ambulatory Visit (INDEPENDENT_AMBULATORY_CARE_PROVIDER_SITE_OTHER): Payer: Medicare Other

## 2016-11-28 ENCOUNTER — Ambulatory Visit (INDEPENDENT_AMBULATORY_CARE_PROVIDER_SITE_OTHER): Payer: Self-pay | Admitting: Vascular Surgery

## 2016-11-28 ENCOUNTER — Encounter (INDEPENDENT_AMBULATORY_CARE_PROVIDER_SITE_OTHER): Payer: Self-pay

## 2016-11-30 ENCOUNTER — Ambulatory Visit (INDEPENDENT_AMBULATORY_CARE_PROVIDER_SITE_OTHER): Payer: Medicare Other

## 2016-11-30 DIAGNOSIS — I771 Stricture of artery: Secondary | ICD-10-CM | POA: Diagnosis not present

## 2016-12-28 ENCOUNTER — Ambulatory Visit (INDEPENDENT_AMBULATORY_CARE_PROVIDER_SITE_OTHER): Payer: Self-pay | Admitting: Vascular Surgery

## 2016-12-28 ENCOUNTER — Encounter (INDEPENDENT_AMBULATORY_CARE_PROVIDER_SITE_OTHER): Payer: Self-pay

## 2017-01-28 ENCOUNTER — Other Ambulatory Visit (INDEPENDENT_AMBULATORY_CARE_PROVIDER_SITE_OTHER): Payer: Self-pay | Admitting: Vascular Surgery

## 2017-01-31 ENCOUNTER — Other Ambulatory Visit (INDEPENDENT_AMBULATORY_CARE_PROVIDER_SITE_OTHER): Payer: Self-pay | Admitting: Vascular Surgery

## 2017-01-31 DIAGNOSIS — I739 Peripheral vascular disease, unspecified: Secondary | ICD-10-CM

## 2017-02-01 ENCOUNTER — Ambulatory Visit (INDEPENDENT_AMBULATORY_CARE_PROVIDER_SITE_OTHER): Payer: Medicare Other

## 2017-02-01 ENCOUNTER — Ambulatory Visit (INDEPENDENT_AMBULATORY_CARE_PROVIDER_SITE_OTHER): Payer: Medicare Other | Admitting: Vascular Surgery

## 2017-02-01 ENCOUNTER — Encounter (INDEPENDENT_AMBULATORY_CARE_PROVIDER_SITE_OTHER): Payer: Self-pay

## 2017-02-01 DIAGNOSIS — I739 Peripheral vascular disease, unspecified: Secondary | ICD-10-CM

## 2017-02-05 ENCOUNTER — Telehealth (INDEPENDENT_AMBULATORY_CARE_PROVIDER_SITE_OTHER): Payer: Self-pay | Admitting: Vascular Surgery

## 2017-02-05 DIAGNOSIS — I739 Peripheral vascular disease, unspecified: Secondary | ICD-10-CM

## 2017-02-05 NOTE — Assessment & Plan Note (Signed)
Patient called. ABIs are noncompressible on the left and normal on the right with likely small vessel disease only by reduced digital pressures. No intervention required. Plan to recheck in 2 years.

## 2017-02-05 NOTE — Telephone Encounter (Signed)
Phone call as above. ABIs are noncompressible on the left and normal on the right. Likely some small vessel disease with reduced digital pressures would overall her perfusion is pretty good. No intervention required. Plan to recheck in 2 years.

## 2017-02-19 ENCOUNTER — Other Ambulatory Visit: Payer: Self-pay | Admitting: Physician Assistant

## 2017-02-19 ENCOUNTER — Ambulatory Visit
Admission: RE | Admit: 2017-02-19 | Discharge: 2017-02-19 | Disposition: A | Discharge status: Home / Self Care | Payer: Medicare Other | Source: Ambulatory Visit | Attending: Physician Assistant | Admitting: Physician Assistant

## 2017-02-19 DIAGNOSIS — R1310 Dysphagia, unspecified: Secondary | ICD-10-CM | POA: Insufficient documentation

## 2017-02-19 DIAGNOSIS — K8020 Calculus of gallbladder without cholecystitis without obstruction: Secondary | ICD-10-CM | POA: Diagnosis not present

## 2017-02-19 DIAGNOSIS — I700 Atherosclerosis of aorta: Secondary | ICD-10-CM | POA: Diagnosis not present

## 2017-02-19 DIAGNOSIS — D649 Anemia, unspecified: Secondary | ICD-10-CM | POA: Diagnosis not present

## 2017-02-19 DIAGNOSIS — R1013 Epigastric pain: Secondary | ICD-10-CM

## 2017-02-19 DIAGNOSIS — N200 Calculus of kidney: Secondary | ICD-10-CM | POA: Diagnosis not present

## 2017-02-19 DIAGNOSIS — K5730 Diverticulosis of large intestine without perforation or abscess without bleeding: Secondary | ICD-10-CM | POA: Insufficient documentation

## 2017-02-19 MED ORDER — IOPAMIDOL (ISOVUE-300) INJECTION 61%
100.0000 mL | Freq: Once | INTRAVENOUS | Status: AC | PRN
  Administered 2017-02-19: 100 mL via INTRAVENOUS

## 2017-02-26 ENCOUNTER — Other Ambulatory Visit: Payer: Self-pay | Admitting: Gastroenterology

## 2017-02-26 DIAGNOSIS — R1310 Dysphagia, unspecified: Secondary | ICD-10-CM

## 2017-02-27 ENCOUNTER — Telehealth (INDEPENDENT_AMBULATORY_CARE_PROVIDER_SITE_OTHER): Payer: Self-pay | Admitting: Vascular Surgery

## 2017-02-27 NOTE — Telephone Encounter (Signed)
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 would like to speak with someone about patients [place] results and the fact that she is still having pain. Please advise.

## 2017-02-27 NOTE — Telephone Encounter (Signed)
Patient having post-prandial pain. Elevated velocities on duplex (11/30/16) when compared to prior (05/30/16). Will bring patient in to exam and discuss.

## 2017-02-28 ENCOUNTER — Inpatient Hospital Stay: Payer: Medicare Other

## 2017-02-28 ENCOUNTER — Other Ambulatory Visit: Payer: Self-pay | Admitting: Hematology and Oncology

## 2017-02-28 ENCOUNTER — Encounter: Payer: Self-pay | Admitting: Hematology and Oncology

## 2017-02-28 ENCOUNTER — Inpatient Hospital Stay: Payer: Medicare Other | Attending: Hematology and Oncology | Admitting: Hematology and Oncology

## 2017-02-28 DIAGNOSIS — Z79899 Other long term (current) drug therapy: Secondary | ICD-10-CM

## 2017-02-28 DIAGNOSIS — Z8711 Personal history of peptic ulcer disease: Secondary | ICD-10-CM | POA: Diagnosis not present

## 2017-02-28 DIAGNOSIS — Z87442 Personal history of urinary calculi: Secondary | ICD-10-CM | POA: Insufficient documentation

## 2017-02-28 DIAGNOSIS — Z7902 Long term (current) use of antithrombotics/antiplatelets: Secondary | ICD-10-CM | POA: Insufficient documentation

## 2017-02-28 DIAGNOSIS — D649 Anemia, unspecified: Secondary | ICD-10-CM

## 2017-02-28 DIAGNOSIS — D509 Iron deficiency anemia, unspecified: Secondary | ICD-10-CM

## 2017-02-28 DIAGNOSIS — Z7982 Long term (current) use of aspirin: Secondary | ICD-10-CM | POA: Diagnosis not present

## 2017-02-28 DIAGNOSIS — E785 Hyperlipidemia, unspecified: Secondary | ICD-10-CM | POA: Insufficient documentation

## 2017-02-28 DIAGNOSIS — K222 Esophageal obstruction: Secondary | ICD-10-CM | POA: Diagnosis not present

## 2017-02-28 DIAGNOSIS — K5730 Diverticulosis of large intestine without perforation or abscess without bleeding: Secondary | ICD-10-CM | POA: Diagnosis not present

## 2017-02-28 DIAGNOSIS — M109 Gout, unspecified: Secondary | ICD-10-CM | POA: Insufficient documentation

## 2017-02-28 DIAGNOSIS — E119 Type 2 diabetes mellitus without complications: Secondary | ICD-10-CM | POA: Diagnosis not present

## 2017-02-28 DIAGNOSIS — Z7984 Long term (current) use of oral hypoglycemic drugs: Secondary | ICD-10-CM | POA: Insufficient documentation

## 2017-02-28 DIAGNOSIS — I10 Essential (primary) hypertension: Secondary | ICD-10-CM | POA: Diagnosis not present

## 2017-02-28 LAB — CBC WITH DIFFERENTIAL/PLATELET
Basophils Absolute: 0.1 10*3/uL (ref 0–0.1)
Basophils Relative: 1 %
Eosinophils Absolute: 0.2 10*3/uL (ref 0–0.7)
Eosinophils Relative: 3 %
HCT: 26.8 % — ABNORMAL LOW (ref 35.0–47.0)
Hemoglobin: 8.7 g/dL — ABNORMAL LOW (ref 12.0–16.0)
Lymphocytes Relative: 22 %
Lymphs Abs: 1.3 10*3/uL (ref 1.0–3.6)
MCH: 25.9 pg — ABNORMAL LOW (ref 26.0–34.0)
MCHC: 32.3 g/dL (ref 32.0–36.0)
MCV: 80.2 fL (ref 80.0–100.0)
Monocytes Absolute: 0.4 10*3/uL (ref 0.2–0.9)
Monocytes Relative: 7 %
Neutro Abs: 4 10*3/uL (ref 1.4–6.5)
Neutrophils Relative %: 67 %
Platelets: 373 10*3/uL (ref 150–440)
RBC: 3.35 MIL/uL — ABNORMAL LOW (ref 3.80–5.20)
RDW: 14.4 % (ref 11.5–14.5)
WBC: 5.9 10*3/uL (ref 3.6–11.0)

## 2017-02-28 LAB — SAMPLE TO BLOOD BANK

## 2017-02-28 LAB — FOLATE: Folate: 54.6 ng/mL (ref >5.9)

## 2017-02-28 LAB — RETICULOCYTES
RBC.: 3.44 MIL/uL — ABNORMAL LOW (ref 3.80–5.20)
Retic Count, Absolute: 44.7 10*3/uL (ref 19.0–183.0)
Retic Ct Pct: 1.3 % (ref 0.4–3.1)

## 2017-02-28 LAB — FERRITIN: Ferritin: 8 ng/mL — ABNORMAL LOW (ref 11–307)

## 2017-02-28 MED ORDER — SODIUM CHLORIDE 0.9 % IV SOLN
Freq: Once | INTRAVENOUS | Status: AC
  Administered 2017-02-28: 13:00:00 via INTRAVENOUS
  Filled 2017-02-28: qty 1000

## 2017-02-28 MED ORDER — IRON SUCROSE 20 MG/ML IV SOLN
200.0000 mg | Freq: Once | INTRAVENOUS | Status: AC
  Administered 2017-02-28: 200 mg via INTRAVENOUS
  Filled 2017-02-28: qty 10

## 2017-02-28 NOTE — Progress Notes (Signed)
[facility] [name]:  02/28/2017  Chief Complaint: [name] is a 83 y.o. female with anemia who is referred in consultation by [name] November, NP for assessment and management.  HPI:   The patient states that she doesn't feel good and that her "blood is dropping".  She describes an episode of "red blood" in her stool 6 months ago.  She states that her "esophagus is hurting".  She has some dysphagia and odynophagia when she swallows.  She can swallow anything warm, but not cold.  Pills and solid food get hung up.  She has some epigastric pain after eating.  She has been taking a PPI as well as Carafate.  Abdomen and pelvic CT scan on 02/19/2017 revealed no acute abnormality.  She has mild sigmoid diverticulosis.  She was diagnosed with iron deficiency anemia.  She has taken iron pills 1/[name] for a year.  She takes her iron with vitamin C pill and occasionally orange juice or cranberry juice.    She has had anemia since 12/08/2015. Hematocrit initially ranged between 32.7 and 34.6 until 11/07/2016.  CBC on 01/23/2017 revealed a hematocrit 28.2 and hemoglobin 8.7 and MCV 89.2. CBC on 02/26/2017 revealed a hematocrit of 25.3, hemoglobin 8.0, MCV 83.2, platelets 337,000, white count 6400 with an ANC of 4600. Differential was unremarkable.    Hemoccult studies x 1 were positive in 11/19/2016 and negative x 2 in 01/2017.  Creatinine (1.0), calcium, and albumen were normal on 01/30/2017.  Iron studies on 01/30/2017 revealed a ferritin of 5, 4% iron saturation, and TIBC 487.5 c/w iron deficiency anemia.  B12 was 647 on 01/30/2017.  Folic acid was 73.5 on 08/09/2015.  TSH was 4.152 on 01/23/2017.  Urinalysis revealed no blood on 01/23/2017.  She states that her diet is modest.  She does not care for red meat.  She eats chicken once a week.  She does not eat spinach, but a few collards.  She tried to eat liver, but it caused her gout to flare.  She denies  any pica.  EGD in 2016 revealed gastric ulcers and esophageal stricture which was dilated.  Barium swallow on 11/15/2015 was normal.  She has a history of mesenteric ischemia.  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  She was on Coumadin, and now on Plavix.  Colonoscopy in 2008 revealed diverticulosis.   She states that she is scheduled for EGD tomorrow and colonoscopy on 06/20/2017.   Past Medical History:  Diagnosis Date  . Anemia   . Arrhythmia   . Basal cell carcinoma of skin   . Brain tumor ([name])   . Brain tumor ([place])   . Cervical spine disease   . Diabetes mellitus without complication ([name])   . Hyperlipemia   . Hypertension   . Kidney stones   . Leaky heart valve   . Meningioma [facility])     Past Surgical History:  Procedure Laterality Date  . ABDOMINAL HYSTERECTOMY    . APPENDECTOMY    . ESOPHAGOGASTRODUODENOSCOPY (EGD) WITH PROPOFOL N/A 03/14/2015   Procedure: ESOPHAGOGASTRODUODENOSCOPY (EGD) WITH PROPOFOL;  Surgeon: Josefine Class, MD;  Location: [facility] ENDOSCOPY;  Service: Endoscopy;  Laterality: N/A;  . EYE SURGERY    . HYSTERECTOMY ABDOMINAL WITH SALPINGECTOMY    . PERIPHERAL VASCULAR CATHETERIZATION N/A 01/23/2016   Procedure: Visceral Venography;  Surgeon: Algernon Huxley, MD;  Location: [place] CV LAB;  Service: Cardiovascular;  Laterality: N/A;  . PERIPHERAL  VASCULAR CATHETERIZATION  01/23/2016   Procedure: Peripheral Vascular Intervention;  Surgeon: Algernon Huxley, MD;  Location: [place] CV LAB;  Service: Cardiovascular;;    No family history on file.  Social History:  reports that she has never smoked. She has never used smokeless tobacco. She reports that she does not drink alcohol or use drugs.  She denies any exposure to radiation or toxins.  She is retired.  She worked at [employer].  She lives in [place].  The patient is accompanied by her daughter, [name], today.  Allergies:  Allergies  Allergen Reactions  . Gabapentin Swelling   . Lipitor [Atorvastatin]   . Mevacor [Lovastatin]   . Milk-Related Compounds     Large quantities cause headaches   . Statins Other (See Comments)    Muscle pain  . Sulfur Diarrhea and Nausea And Vomiting  . Zocor [Simvastatin]     Current Medications: Current Outpatient Prescriptions  Medication Sig Dispense Refill  . acetaminophen (TYLENOL) 650 MG CR tablet Take 650 mg by mouth every 8 (eight) hours as needed for pain.    Marland Kitchen allopurinol (ZYLOPRIM) 100 MG tablet Take 100 mg by mouth daily.  0  . clopidogrel (PLAVIX) 75 MG tablet take 1 tablet by mouth once daily 30 tablet 11  . ferrous sulfate 325 (65 FE) MG tablet Take 325 mg by mouth daily with breakfast.    . furosemide (LASIX) 20 MG tablet Take 1 tablet by mouth daily.  0  . glipiZIDE (GLUCOTROL) 10 MG tablet Take 5 mg by mouth once.   0  . hydrALAZINE (APRESOLINE) 50 MG tablet Take 50 mg by mouth 3 (three) times daily.    . hydrochlorothiazide (HYDRODIURIL) 25 MG tablet Take 25 mg by mouth daily.  0  . iron polysaccharides (NIFEREX) 150 MG capsule Take 150 mg by mouth daily.    Marland Kitchen lisinopril (PRINIVIL,ZESTRIL) 40 MG tablet Take 1 tablet by mouth daily.  0  . meloxicam (MOBIC) 15 MG tablet Take 1 tablet by mouth daily.  0  . metoprolol succinate (TOPROL-XL) 50 MG 24 hr tablet Take 1 tablet by mouth daily.  0  . pantoprazole (PROTONIX) 40 MG tablet Take 40 mg by mouth 2 (two) times daily.    Marland Kitchen rOPINIRole (REQUIP) 2 MG tablet Take 2 mg by mouth 2 (two) times daily.   0  . sucralfate (CARAFATE) 1 g tablet Take 1 g by mouth daily.  0  . vitamin B-12 (CYANOCOBALAMIN) 1000 MCG tablet Take 1,000 mcg by mouth daily.    Marland Kitchen aspirin 81 MG tablet Take 81 mg by mouth daily.    . Calcium Carbonate-Vitamin D (CALCIUM + D PO) Take 1 tablet by mouth daily. Reported on 01/23/2016    . HYDROcodone-acetaminophen (NORCO/VICODIN) 5-325 MG per tablet take 1 tablet by mouth once daily if needed for pain  0  . metFORMIN (GLUCOPHAGE) 500 MG tablet Take 500  mg by mouth 2 (two) times daily. Reported on 01/23/2016  0  . omeprazole (PRILOSEC) 40 MG capsule Take 40 mg by mouth daily.  0  . zolpidem (AMBIEN) 5 MG tablet Take 5 mg by mouth at bedtime as needed for sleep. Reported on 01/23/2016     No current facility-administered medications for this visit.     Review of Systems:  GENERAL:  Doesn't feel good.  No fevers, sweats or weight loss. PERFORMANCE STATUS (ECOG):  1 HEENT:  Vision issues, afraid to drive.  No runny nose, sore throat, mouth  sores or tenderness. Lungs: No shortness of breath or cough.  No hemoptysis. Cardiac:  No chest pain, palpitations, orthopnea, or PND. GI:  Esophagus "hurting".  No nausea, vomiting, diarrhea, constipation, or melena.  Bowel movement normal in AM then 1/2 hour after breakfast is loose.  Saw red blood in stool 6 months ago. GU:  No urgency, frequency, dysuria, or hematuria. Musculoskeletal:  No back pain.  Arthritis pain.  No muscle tenderness. Extremities:  Gout.  Feet and legs swelling. Skin:  No rashes or skin changes. Neuro:  Dull headache. No numbness or weakness, balance or coordination issues. Endocrine:  No diabetes.  Thyroid disease on Synthroid.  No hot flashes or night sweats. Psych:  No mood changes, depression or anxiety. Pain:  No focal pain. Review of systems:  All other systems reviewed and found to be negative.  Physical Exam: Blood pressure (!) 161/53, pulse (!) 56, temperature 98 F (36.7 C), temperature source Tympanic, resp. rate 18, height 5' 3.5" (1.613 m), weight 122 lb 9 oz (55.6 kg). GENERAL:  Well developed, well nourished, elderly woman sitting comfortably a wheelchair in the exam room in no acute distress. MENTAL STATUS:  Alert and oriented to person, place and time. HEAD:  Short gray hair.  Normocephalic, atraumatic, face symmetric, no Cushingoid features. EYES:  Glasses.  Pupils equal round and reactive to light and accomodation.  No conjunctivitis or scleral icterus. ENT:   Oropharynx clear without lesion.  Tongue normal. Mucous membranes moist.  RESPIRATORY:  Clear to auscultation without rales, wheezes or rhonchi. CARDIOVASCULAR:  Regular rate and rhythm without murmur, rub or gallop. ABDOMEN:  Soft, non-tender, with active bowel sounds, and no hepatosplenomegaly.  No masses. SKIN:  No rashes, ulcers or lesions. EXTREMITIES: Chronic lower extremity edema.  No skin discoloration or tenderness.  No palpable cords. LYMPH NODES: No palpable cervical, supraclavicular, axillary or inguinal adenopathy  NEUROLOGICAL: Unremarkable. PSYCH:  Appropriate.   Appointment on 02/28/2017  Component Date Value Ref Range Status  . WBC 02/28/2017 5.9  3.6 - 11.0 K/uL Final  . RBC 02/28/2017 3.35* 3.80 - 5.20 MIL/uL Final  . Hemoglobin 02/28/2017 8.7* 12.0 - 16.0 g/dL Final  . HCT 02/28/2017 26.8* 35.0 - 47.0 % Final  . MCV 02/28/2017 80.2  80.0 - 100.0 fL Final  . MCH 02/28/2017 25.9* 26.0 - 34.0 pg Final  . MCHC 02/28/2017 32.3  32.0 - 36.0 g/dL Final  . RDW 02/28/2017 14.4  11.5 - 14.5 % Final  . Platelets 02/28/2017 373  150 - 440 K/uL Final  . Neutrophils Relative % 02/28/2017 67  % Final  . Neutro Abs 02/28/2017 4.0  1.4 - 6.5 K/uL Final  . Lymphocytes Relative 02/28/2017 22  % Final  . Lymphs Abs 02/28/2017 1.3  1.0 - 3.6 K/uL Final  . Monocytes Relative 02/28/2017 7  % Final  . Monocytes Absolute 02/28/2017 0.4  0.2 - 0.9 K/uL Final  . Eosinophils Relative 02/28/2017 3  % Final  . Eosinophils Absolute 02/28/2017 0.2  0 - 0.7 K/uL Final  . Basophils Relative 02/28/2017 1  % Final  . Basophils Absolute 02/28/2017 0.1  0 - 0.1 K/uL Final  . Blood Bank Specimen 02/28/2017 SAMPLE AVAILABLE FOR TESTING   Final  . Sample Expiration 02/28/2017 03/03/2017   Final  . Retic Ct Pct 02/28/2017 1.3  0.4 - 3.1 % Final  . RBC. 02/28/2017 3.44* 3.80 - 5.20 MIL/uL Final  . Retic Count, Manual 02/28/2017 44.7  19.0 - 183.0 K/uL Final  .  Ferritin 02/28/2017 8* 11 - 307 ng/mL  Final  . Folate 02/28/2017 54.6  >5.9 ng/mL Final   RESULT CONFIRMED BY MANUAL DILUTION JJB    Assessment:  Ignacia Gentzler [name] is a 83 y.o. female with iron deficiency anemia.  She has been on oral iron x 1 year with continued decline in her counts.  She has dysphagia and odynophagia.  She is on  PPI and carafate.  Diet is modest.  She denies pica.  Abdomen and pelvic CT on 02/19/2017 revealed no acute abnormality.  She has mild sigmoid diverticulosis.  CBC on 02/26/2017 revealed a hematocrit of 25.3, hemoglobin 8.0, and MCV 83.2.  Hemoccult studies x 1 were positive in 11/19/2016 and negative x 2 in 01/2017.  Ferritin was 5 with an iron saturation of 4%, and a TIBC of 487 on 01/30/2017.  Normal studies included:  creatinine (1.0), calcium, albumen, B12 (647), TSH.  Folic acid was 12.8 on 08/09/2015.  Urinalysis revealed no blood on 01/23/2017.  EGD in 2016 revealed gastric ulcers and esophageal stricture which was dilated.  Barium swallow on 11/15/2015 was normal.  She has a history of mesenteric ischemia.  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  She was on Coumadin, and now on Plavix.  Colonoscopy in 2008 revealed diverticulosis.  She is scheduled for EGD tomorrow and colonoscopy on 06/20/2017.  Symptomatically, she is fatigued.  Exam is unremarkable.  Plan: 1.  Discuss iron deficiency anemia and failed trial of oral iron x 1 year.  Etiology of iron deficiency likely secondary to diet and possible GI abnormality.  Endoscopies are scheduled.  Discuss IV iron.  Risks and benefits discussed.  Information provided.  Patient consented. 2.  Labs today: CBC with diff, retic, hold tube, folate, SPEP. 3.  Venofer today. 4.  RTC weekly x 2 for Venofer. 5.  Check CBC next week. 6.  RTC in 3 weeks for MD assessment, labs (CBC with diff, ferritin-[name] before), and +/- Venofer.   Lequita Asal, MD  02/28/2017, 11:45 AM

## 2017-02-28 NOTE — Progress Notes (Signed)
Patient here today as new evaluation regarding decreased Hgb.  Referred by Dr. Jacqulyn Liner.

## 2017-02-28 NOTE — Telephone Encounter (Signed)
Patient is already scheduled with Dr. Lucky Cowboy tomorrow per Maudie Mercury

## 2017-02-28 NOTE — Patient Instructions (Signed)
Iron Sucrose injection What is this medicine? IRON SUCROSE (AHY ern SOO krohs) is an iron complex. Iron is used to make healthy red blood cells, which carry oxygen and nutrients throughout the body. This medicine is used to treat iron deficiency anemia in people with chronic kidney disease. This medicine may be used for other purposes; ask your health care provider or pharmacist if you have questions. COMMON BRAND NAME(S): Venofer What should I tell my health care provider before I take this medicine? They need to know if you have any of these conditions: -anemia not caused by low iron levels -heart disease -high levels of iron in the blood -kidney disease -liver disease -an unusual or allergic reaction to iron, other medicines, foods, dyes, or preservatives -pregnant or trying to get pregnant -breast-feeding How should I use this medicine? This medicine is for infusion into a vein. It is given by a health care professional in a hospital or clinic setting. Talk to your pediatrician regarding the use of this medicine in children. While this drug may be prescribed for children as young as 2 years for selected conditions, precautions do apply. Overdosage: If you think you have taken too much of this medicine contact a poison control center or emergency room at once. NOTE: This medicine is only for you. Do not share this medicine with others. What if I miss a dose? It is important not to miss your dose. Call your doctor or health care professional if you are unable to keep an appointment. What may interact with this medicine? Do not take this medicine with any of the following medications: -deferoxamine -dimercaprol -other iron products This medicine may also interact with the following medications: -chloramphenicol -deferasirox This list may not describe all possible interactions. Give your health care provider a list of all the medicines, herbs, non-prescription drugs, or dietary  supplements you use. Also tell them if you smoke, drink alcohol, or use illegal drugs. Some items may interact with your medicine. What should I watch for while using this medicine? Visit your doctor or healthcare professional regularly. Tell your doctor or healthcare professional if your symptoms do not start to get better or if they get worse. You may need blood work done while you are taking this medicine. You may need to follow a special diet. Talk to your doctor. Foods that contain iron include: whole grains/cereals, dried fruits, beans, or peas, leafy green vegetables, and organ meats (liver, kidney). What side effects may I notice from receiving this medicine? Side effects that you should report to your doctor or health care professional as soon as possible: -allergic reactions like skin rash, itching or hives, swelling of the face, lips, or tongue -breathing problems -changes in blood pressure -cough -fast, irregular heartbeat -feeling faint or lightheaded, falls -fever or chills -flushing, sweating, or hot feelings -joint or muscle aches/pains -seizures -swelling of the ankles or feet -unusually weak or tired Side effects that usually do not require medical attention (report to your doctor or health care professional if they continue or are bothersome): -diarrhea -feeling achy -headache -irritation at site where injected -nausea, vomiting -stomach upset -tiredness This list may not describe all possible side effects. Call your doctor for medical advice about side effects. You may report side effects to FDA at 1-800-FDA-1088. Where should I keep my medicine? This drug is given in a hospital or clinic and will not be stored at home. NOTE: This sheet is a summary. It may not cover all possible information. If   you have questions about this medicine, talk to your doctor, pharmacist, or health care provider.  2018 Elsevier/Gold Standard (2011-06-28 17:14:35)  

## 2017-03-01 ENCOUNTER — Ambulatory Visit (INDEPENDENT_AMBULATORY_CARE_PROVIDER_SITE_OTHER): Payer: Medicare Other | Admitting: Vascular Surgery

## 2017-03-01 ENCOUNTER — Encounter (INDEPENDENT_AMBULATORY_CARE_PROVIDER_SITE_OTHER): Payer: Self-pay | Admitting: Vascular Surgery

## 2017-03-01 ENCOUNTER — Encounter (INDEPENDENT_AMBULATORY_CARE_PROVIDER_SITE_OTHER): Payer: Self-pay

## 2017-03-01 ENCOUNTER — Ambulatory Visit
Admission: RE | Admit: 2017-03-01 | Discharge: 2017-03-01 | Disposition: A | Discharge status: Home / Self Care | Payer: Medicare Other | Source: Ambulatory Visit | Attending: Gastroenterology | Admitting: Gastroenterology

## 2017-03-01 VITALS — BP 156/52 | HR 74 | Resp 16 | Wt 125.0 lb

## 2017-03-01 DIAGNOSIS — K222 Esophageal obstruction: Secondary | ICD-10-CM | POA: Diagnosis not present

## 2017-03-01 DIAGNOSIS — E118 Type 2 diabetes mellitus with unspecified complications: Secondary | ICD-10-CM | POA: Diagnosis not present

## 2017-03-01 DIAGNOSIS — K559 Vascular disorder of intestine, unspecified: Secondary | ICD-10-CM | POA: Diagnosis not present

## 2017-03-01 DIAGNOSIS — R1310 Dysphagia, unspecified: Secondary | ICD-10-CM

## 2017-03-01 DIAGNOSIS — I739 Peripheral vascular disease, unspecified: Secondary | ICD-10-CM

## 2017-03-01 NOTE — Progress Notes (Signed)
Subjective:    Patient ID: [name], female    DOB: 07/01/1929, 83 y.o.   MRN: [id number] Chief Complaint  Patient presents with  . Follow-up    Patient presents today with a chief complaint of "abdominal pain". I spoke with the patient's gastroenterologist nurse practitioner via the phone this week. She was concerned about the patient's ongoing abdominal pain after eating. A CT scan on 02/19/2017 was unrevealing. Radiologist does mention an SMA stent however does not mention if the stent is patent. The patient continues to have postprandial pain for up to 3 hours after eating. The patient underwent a placement of an SMA stent on 01/15/2017. The patient states undergoing pain after eating which has worsened over the last few months. She denies any changes in bowel habits. The patient did have an ultrasound in March 2018 which showed increased velocities in the SMA stent.   Review of Systems  Constitutional: Negative.   HENT: Negative.   Eyes: Negative.   Respiratory: Negative.   Gastrointestinal: Positive for abdominal pain.  Endocrine: Negative.   Genitourinary: Negative.   Musculoskeletal: Negative.   Skin: Negative.   Allergic/Immunologic: Negative.   Neurological: Negative.   Hematological: Negative.   Psychiatric/Behavioral: Negative.       Objective:   Physical Exam  Constitutional: She is oriented to person, place, and time. She appears well-developed and well-nourished. No distress.  HENT:  Head: Normocephalic and atraumatic.  Eyes: Conjunctivae are normal. Pupils are equal, round, and reactive to light.  Neck: Normal range of motion.  Cardiovascular: Normal rate, regular rhythm, normal heart sounds and intact distal pulses.   Pulses:      Radial pulses are 2+ on the right side, and 2+ on the left side.  Pulmonary/Chest: Effort normal.  Abdominal: Soft. Bowel sounds are normal. She exhibits no distension. There is no tenderness. There is no rebound and  no guarding.  Musculoskeletal: Normal range of motion. She exhibits no edema.  Neurological: She is alert and oriented to person, place, and time.  Skin: Skin is warm and dry. She is not diaphoretic.  Psychiatric: She has a normal mood and affect. Her behavior is normal. Judgment and thought content normal.  Vitals reviewed.  BP (!) 156/52   Pulse 74   Resp 16   Wt 125 lb (56.7 kg)   BMI 21.80 kg/m   Past Medical History:  Diagnosis Date  . Anemia   . Arrhythmia   . Basal cell carcinoma of skin   . Brain tumor ([place])   . Brain tumor ([place])   . Cervical spine disease   . Diabetes mellitus without complication ([place])   . Hyperlipemia   . Hypertension   . Kidney stones   . Leaky heart valve   . Meningioma [facility])     Social History   Social History  . Marital status: Widowed    Spouse name: N/A  . Number of children: N/A  . Years of education: N/A   Occupational History  . Not on file.   Social History Main Topics  . Smoking status: Never Smoker  . Smokeless tobacco: Never Used  . Alcohol use No  . Drug use: No  . Sexual activity: Not on file   Other Topics Concern  . Not on file   Social History Narrative  . No narrative on file    Past Surgical History:  Procedure Laterality Date  . ABDOMINAL HYSTERECTOMY    . APPENDECTOMY    .  ESOPHAGOGASTRODUODENOSCOPY (EGD) WITH PROPOFOL N/A 03/14/2015   Procedure: ESOPHAGOGASTRODUODENOSCOPY (EGD) WITH PROPOFOL;  Surgeon: Josefine Class, MD;  Location: [facility] ENDOSCOPY;  Service: Endoscopy;  Laterality: N/A;  . EYE SURGERY    . HYSTERECTOMY ABDOMINAL WITH SALPINGECTOMY    . PERIPHERAL VASCULAR CATHETERIZATION N/A 01/23/2016   Procedure: Visceral Venography;  Surgeon: Algernon Huxley, MD;  Location: [place] CV LAB;  Service: Cardiovascular;  Laterality: N/A;  . PERIPHERAL VASCULAR CATHETERIZATION  01/23/2016   Procedure: Peripheral Vascular Intervention;  Surgeon: Algernon Huxley, MD;  Location: [place] CV LAB;   Service: Cardiovascular;;    No family history on file.  Allergies  Allergen Reactions  . Gabapentin Swelling  . Lipitor [Atorvastatin]   . Mevacor [Lovastatin]   . Milk-Related Compounds     Large quantities cause headaches   . Statins Other (See Comments)    Muscle pain  . Sulfur Diarrhea and Nausea And Vomiting  . Zocor [Simvastatin]        Assessment & Plan:  Patient presents today with a chief complaint of "abdominal pain". I spoke with the patient's gastroenterologist nurse practitioner via the phone this week. She was concerned about the patient's ongoing abdominal pain after eating. A CT scan on 02/19/2017 was unrevealing. Radiologist does mention an SMA stent however does not mention if the stent is patent. The patient continues to have postprandial pain for up to 3 hours after eating. The patient underwent a placement of an SMA stent on 01/15/2017. The patient states undergoing pain after eating which has worsened over the last few months. She denies any changes in bowel habits. The patient did have an ultrasound in March 2018 which showed increased velocities in the SMA stent.  1. PAD (peripheral artery disease) (HCC) - Stable Followed on a regular basis. The patient is not currently having any lower extremity pain.  2. Mesenteric ischemia (HCC) - worsening Patient with postprandial pain which is worsening of the last couple of months. Most recent abdominal ultrasound from March 2018 with increased velocity within the SMA stent.  Recommend a mesenteric angiogram to rule out any mesenteric ischemia.  Patient seemed with the daughter. Procedures risks and benefits explained to patient. All questions answered. The patient and daughter are willing to proceed.  3. Type 2 diabetes mellitus with complication, unspecified whether long term insulin use (HCC) - stable Encouraged good control as its slows the progression of atherosclerotic disease  Current Outpatient  Prescriptions on File Prior to Visit  Medication Sig Dispense Refill  . acetaminophen (TYLENOL) 650 MG CR tablet Take 650 mg by mouth every 8 (eight) hours as needed for pain.    Marland Kitchen allopurinol (ZYLOPRIM) 100 MG tablet Take 100 mg by mouth daily.  0  . aspirin 81 MG tablet Take 81 mg by mouth daily.    . Calcium Carbonate-Vitamin D (CALCIUM + D PO) Take 1 tablet by mouth daily. Reported on 01/23/2016    . clopidogrel (PLAVIX) 75 MG tablet take 1 tablet by mouth once daily 30 tablet 11  . ferrous sulfate 325 (65 FE) MG tablet Take 325 mg by mouth daily with breakfast.    . furosemide (LASIX) 20 MG tablet Take 1 tablet by mouth daily.  0  . glipiZIDE (GLUCOTROL) 10 MG tablet Take 5 mg by mouth once.   0  . hydrALAZINE (APRESOLINE) 50 MG tablet Take 50 mg by mouth 3 (three) times daily.    . hydrochlorothiazide (HYDRODIURIL) 25 MG tablet Take 25 mg by  mouth daily.  0  . HYDROcodone-acetaminophen (NORCO/VICODIN) 5-325 MG per tablet take 1 tablet by mouth once daily if needed for pain  0  . iron polysaccharides (NIFEREX) 150 MG capsule Take 150 mg by mouth daily.    Marland Kitchen lisinopril (PRINIVIL,ZESTRIL) 40 MG tablet Take 1 tablet by mouth daily.  0  . meloxicam (MOBIC) 15 MG tablet Take 1 tablet by mouth daily.  0  . metFORMIN (GLUCOPHAGE) 500 MG tablet Take 500 mg by mouth 2 (two) times daily. Reported on 01/23/2016  0  . metoprolol succinate (TOPROL-XL) 50 MG 24 hr tablet Take 1 tablet by mouth daily.  0  . omeprazole (PRILOSEC) 40 MG capsule Take 40 mg by mouth daily.  0  . pantoprazole (PROTONIX) 40 MG tablet Take 40 mg by mouth 2 (two) times daily.    Marland Kitchen rOPINIRole (REQUIP) 2 MG tablet Take 2 mg by mouth 2 (two) times daily.   0  . sucralfate (CARAFATE) 1 g tablet Take 1 g by mouth daily.  0  . vitamin B-12 (CYANOCOBALAMIN) 1000 MCG tablet Take 1,000 mcg by mouth daily.    Marland Kitchen zolpidem (AMBIEN) 5 MG tablet Take 5 mg by mouth at bedtime as needed for sleep. Reported on 01/23/2016     No current  facility-administered medications on file prior to visit.     There are no Patient Instructions on file for this visit. No Follow-up on file.   Avleen Bordwell A Illyana Schorsch, PA-C

## 2017-03-03 ENCOUNTER — Encounter: Payer: Self-pay | Admitting: Hematology and Oncology

## 2017-03-03 LAB — MULTIPLE MYELOMA PANEL, SERUM
Albumin SerPl Elph-Mcnc: 3.3 g/dL (ref 2.9–4.4)
Albumin/Glob SerPl: 1.2 (ref 0.7–1.7)
Alpha 1: 0.2 g/dL (ref 0.0–0.4)
Alpha2 Glob SerPl Elph-Mcnc: 0.9 g/dL (ref 0.4–1.0)
B-Globulin SerPl Elph-Mcnc: 1.2 g/dL (ref 0.7–1.3)
Gamma Glob SerPl Elph-Mcnc: 0.6 g/dL (ref 0.4–1.8)
Globulin, Total: 3 g/dL (ref 2.2–3.9)
IgA: 422 mg/dL (ref 64–422)
IgG (Immunoglobin G), Serum: 689 mg/dL — ABNORMAL LOW (ref 700–1600)
IgM, Serum: 79 mg/dL (ref 26–217)
Total Protein ELP: 6.3 g/dL (ref 6.0–8.5)

## 2017-03-07 ENCOUNTER — Inpatient Hospital Stay: Payer: Medicare Other | Attending: Hematology and Oncology

## 2017-03-07 ENCOUNTER — Inpatient Hospital Stay: Payer: Medicare Other

## 2017-03-07 VITALS — BP 157/52 | HR 76 | Resp 20

## 2017-03-07 DIAGNOSIS — K222 Esophageal obstruction: Secondary | ICD-10-CM | POA: Insufficient documentation

## 2017-03-07 DIAGNOSIS — E119 Type 2 diabetes mellitus without complications: Secondary | ICD-10-CM | POA: Insufficient documentation

## 2017-03-07 DIAGNOSIS — Z79899 Other long term (current) drug therapy: Secondary | ICD-10-CM | POA: Diagnosis not present

## 2017-03-07 DIAGNOSIS — E785 Hyperlipidemia, unspecified: Secondary | ICD-10-CM | POA: Insufficient documentation

## 2017-03-07 DIAGNOSIS — K5730 Diverticulosis of large intestine without perforation or abscess without bleeding: Secondary | ICD-10-CM | POA: Diagnosis not present

## 2017-03-07 DIAGNOSIS — Z85828 Personal history of other malignant neoplasm of skin: Secondary | ICD-10-CM | POA: Insufficient documentation

## 2017-03-07 DIAGNOSIS — Z87442 Personal history of urinary calculi: Secondary | ICD-10-CM | POA: Diagnosis not present

## 2017-03-07 DIAGNOSIS — D649 Anemia, unspecified: Secondary | ICD-10-CM

## 2017-03-07 DIAGNOSIS — D509 Iron deficiency anemia, unspecified: Secondary | ICD-10-CM | POA: Diagnosis not present

## 2017-03-07 DIAGNOSIS — I10 Essential (primary) hypertension: Secondary | ICD-10-CM | POA: Insufficient documentation

## 2017-03-07 DIAGNOSIS — Z7984 Long term (current) use of oral hypoglycemic drugs: Secondary | ICD-10-CM | POA: Insufficient documentation

## 2017-03-07 LAB — CBC WITH DIFFERENTIAL/PLATELET
Basophils Absolute: 0.1 10*3/uL (ref 0–0.1)
Basophils Relative: 1 %
Eosinophils Absolute: 0.2 10*3/uL (ref 0–0.7)
Eosinophils Relative: 3 %
HCT: 28 % — ABNORMAL LOW (ref 35.0–47.0)
Hemoglobin: 9 g/dL — ABNORMAL LOW (ref 12.0–16.0)
Lymphocytes Relative: 18 %
Lymphs Abs: 1.1 10*3/uL (ref 1.0–3.6)
MCH: 26 pg (ref 26.0–34.0)
MCHC: 32.2 g/dL (ref 32.0–36.0)
MCV: 80.8 fL (ref 80.0–100.0)
Monocytes Absolute: 0.3 10*3/uL (ref 0.2–0.9)
Monocytes Relative: 5 %
Neutro Abs: 4.4 10*3/uL (ref 1.4–6.5)
Neutrophils Relative %: 73 %
Platelets: 385 10*3/uL (ref 150–440)
RBC: 3.46 MIL/uL — ABNORMAL LOW (ref 3.80–5.20)
RDW: 15.6 % — ABNORMAL HIGH (ref 11.5–14.5)
WBC: 6.1 10*3/uL (ref 3.6–11.0)

## 2017-03-07 MED ORDER — IRON SUCROSE 20 MG/ML IV SOLN: 200.0000 mg | Freq: Once | INTRAVENOUS | Status: DC

## 2017-03-07 MED ORDER — SODIUM CHLORIDE 0.9 % IV SOLN
Freq: Once | INTRAVENOUS | Status: AC
  Administered 2017-03-07: 14:00:00 via INTRAVENOUS
  Filled 2017-03-07: qty 1000

## 2017-03-07 MED ORDER — IRON SUCROSE 20 MG/ML IV SOLN
200.0000 mg | Freq: Once | INTRAVENOUS | Status: AC
  Administered 2017-03-07: 200 mg via INTRAVENOUS
  Filled 2017-03-07: qty 10

## 2017-03-11 ENCOUNTER — Other Ambulatory Visit (INDEPENDENT_AMBULATORY_CARE_PROVIDER_SITE_OTHER): Payer: Self-pay | Admitting: Vascular Surgery

## 2017-03-14 ENCOUNTER — Inpatient Hospital Stay: Payer: Medicare Other

## 2017-03-14 VITALS — BP 162/62 | HR 56 | Temp 97.0°F | Resp 18

## 2017-03-14 DIAGNOSIS — D649 Anemia, unspecified: Secondary | ICD-10-CM

## 2017-03-14 DIAGNOSIS — D509 Iron deficiency anemia, unspecified: Secondary | ICD-10-CM | POA: Diagnosis not present

## 2017-03-14 MED ORDER — IRON SUCROSE 20 MG/ML IV SOLN
200.0000 mg | Freq: Once | INTRAVENOUS | Status: AC
  Administered 2017-03-14: 200 mg via INTRAVENOUS
  Filled 2017-03-14: qty 10

## 2017-03-14 MED ORDER — SODIUM CHLORIDE 0.9 % IV SOLN
Freq: Once | INTRAVENOUS | Status: AC
  Administered 2017-03-14: 13:00:00 via INTRAVENOUS
  Filled 2017-03-14: qty 1000

## 2017-03-14 MED ORDER — IRON SUCROSE 20 MG/ML IV SOLN: 200.0000 mg | Freq: Once | INTRAVENOUS | Status: DC

## 2017-03-17 MED ORDER — CEFAZOLIN SODIUM-DEXTROSE 1-4 GM/50ML-% IV SOLN
1.0000 g | Freq: Once | INTRAVENOUS | Status: AC
  Administered 2017-03-18: 1 g via INTRAVENOUS

## 2017-03-18 ENCOUNTER — Encounter
Admission: RE | Disposition: A | Discharge status: Home / Self Care | Payer: Self-pay | Source: Ambulatory Visit | Attending: Vascular Surgery

## 2017-03-18 ENCOUNTER — Ambulatory Visit
Admission: RE | Admit: 2017-03-18 | Discharge: 2017-03-18 | Disposition: A | Discharge status: Home / Self Care | Payer: Medicare Other | Source: Ambulatory Visit | Attending: Vascular Surgery | Admitting: Vascular Surgery

## 2017-03-18 DIAGNOSIS — Z90710 Acquired absence of both cervix and uterus: Secondary | ICD-10-CM | POA: Diagnosis not present

## 2017-03-18 DIAGNOSIS — Z85828 Personal history of other malignant neoplasm of skin: Secondary | ICD-10-CM | POA: Insufficient documentation

## 2017-03-18 DIAGNOSIS — I10 Essential (primary) hypertension: Secondary | ICD-10-CM | POA: Insufficient documentation

## 2017-03-18 DIAGNOSIS — I739 Peripheral vascular disease, unspecified: Secondary | ICD-10-CM | POA: Insufficient documentation

## 2017-03-18 DIAGNOSIS — I429 Cardiomyopathy, unspecified: Secondary | ICD-10-CM | POA: Diagnosis not present

## 2017-03-18 DIAGNOSIS — Z7984 Long term (current) use of oral hypoglycemic drugs: Secondary | ICD-10-CM | POA: Diagnosis not present

## 2017-03-18 DIAGNOSIS — K551 Chronic vascular disorders of intestine: Secondary | ICD-10-CM | POA: Insufficient documentation

## 2017-03-18 DIAGNOSIS — Z87442 Personal history of urinary calculi: Secondary | ICD-10-CM | POA: Diagnosis not present

## 2017-03-18 DIAGNOSIS — Z882 Allergy status to sulfonamides status: Secondary | ICD-10-CM | POA: Diagnosis not present

## 2017-03-18 DIAGNOSIS — Z888 Allergy status to other drugs, medicaments and biological substances status: Secondary | ICD-10-CM | POA: Insufficient documentation

## 2017-03-18 DIAGNOSIS — Z91011 Allergy to milk products: Secondary | ICD-10-CM | POA: Diagnosis not present

## 2017-03-18 DIAGNOSIS — Z7902 Long term (current) use of antithrombotics/antiplatelets: Secondary | ICD-10-CM | POA: Insufficient documentation

## 2017-03-18 DIAGNOSIS — D649 Anemia, unspecified: Secondary | ICD-10-CM | POA: Diagnosis not present

## 2017-03-18 DIAGNOSIS — Z86011 Personal history of benign neoplasm of the brain: Secondary | ICD-10-CM | POA: Insufficient documentation

## 2017-03-18 DIAGNOSIS — E785 Hyperlipidemia, unspecified: Secondary | ICD-10-CM | POA: Insufficient documentation

## 2017-03-18 DIAGNOSIS — E119 Type 2 diabetes mellitus without complications: Secondary | ICD-10-CM | POA: Insufficient documentation

## 2017-03-18 DIAGNOSIS — Z9889 Other specified postprocedural states: Secondary | ICD-10-CM | POA: Insufficient documentation

## 2017-03-18 DIAGNOSIS — R109 Unspecified abdominal pain: Secondary | ICD-10-CM | POA: Diagnosis not present

## 2017-03-18 HISTORY — PX: VISCERAL ARTERY INTERVENTION: CATH118277

## 2017-03-18 HISTORY — PX: VISCERAL ANGIOGRAPHY: CATH118276

## 2017-03-18 LAB — BASIC METABOLIC PANEL
Anion gap: 7 (ref 5–15)
BUN: 28 mg/dL — ABNORMAL HIGH (ref 6–20)
CO2: 28 mmol/L (ref 22–32)
Calcium: 9.1 mg/dL (ref 8.9–10.3)
Chloride: 103 mmol/L (ref 101–111)
Creatinine, Ser: 1.2 mg/dL — ABNORMAL HIGH (ref 0.44–1.00)
GFR calc Af Amer: 46 mL/min — ABNORMAL LOW (ref >60)
GFR calc non Af Amer: 40 mL/min — ABNORMAL LOW (ref >60)
Glucose, Bld: 154 mg/dL — ABNORMAL HIGH (ref 65–99)
Potassium: 3.7 mmol/L (ref 3.5–5.1)
Sodium: 138 mmol/L (ref 135–145)

## 2017-03-18 LAB — GLUCOSE, CAPILLARY: Glucose-Capillary: 139 mg/dL — ABNORMAL HIGH (ref 65–99)

## 2017-03-18 SURGERY — VISCERAL ANGIOGRAPHY
Anesthesia: Moderate Sedation

## 2017-03-18 MED ORDER — HYDROMORPHONE HCL 1 MG/ML IJ SOLN: 0.5000 mg | INTRAMUSCULAR | Status: DC | PRN

## 2017-03-18 MED ORDER — SODIUM CHLORIDE 0.9 % IV SOLN: Freq: Once | INTRAVENOUS | Status: DC

## 2017-03-18 MED ORDER — FENTANYL CITRATE (PF) 100 MCG/2ML IJ SOLN
INTRAMUSCULAR | Status: DC | PRN
Start: 2017-03-18 — End: 2017-03-18
  Administered 2017-03-18: 50 ug via INTRAVENOUS

## 2017-03-18 MED ORDER — ONDANSETRON HCL 4 MG/2ML IJ SOLN: 4.0000 mg | Freq: Four times a day (QID) | INTRAMUSCULAR | Status: DC | PRN

## 2017-03-18 MED ORDER — GUAIFENESIN-DM 100-10 MG/5ML PO SYRP: 15.0000 mL | ORAL_SOLUTION | ORAL | Status: DC | PRN

## 2017-03-18 MED ORDER — IOPAMIDOL (ISOVUE-300) INJECTION 61%
INTRAVENOUS | Status: DC | PRN
  Administered 2017-03-18: 35 mL via INTRAVENOUS

## 2017-03-18 MED ORDER — MIDAZOLAM HCL 5 MG/5ML IJ SOLN
INTRAMUSCULAR | Status: AC
  Filled 2017-03-18: qty 5

## 2017-03-18 MED ORDER — HEPARIN (PORCINE) IN NACL 2-0.9 UNIT/ML-% IJ SOLN
INTRAMUSCULAR | Status: AC
  Filled 2017-03-18: qty 1000

## 2017-03-18 MED ORDER — ACETAMINOPHEN 325 MG PO TABS: 325.0000 mg | ORAL_TABLET | ORAL | Status: DC | PRN

## 2017-03-18 MED ORDER — LABETALOL HCL 5 MG/ML IV SOLN: 10.0000 mg | INTRAVENOUS | Status: DC | PRN

## 2017-03-18 MED ORDER — LIDOCAINE-EPINEPHRINE (PF) 2 %-1:200000 IJ SOLN
INTRAMUSCULAR | Status: AC
  Filled 2017-03-18: qty 20

## 2017-03-18 MED ORDER — METHYLPREDNISOLONE SODIUM SUCC 125 MG IJ SOLR: 125.0000 mg | INTRAMUSCULAR | Status: DC | PRN

## 2017-03-18 MED ORDER — HYDRALAZINE HCL 20 MG/ML IJ SOLN: 5.0000 mg | INTRAMUSCULAR | Status: DC | PRN

## 2017-03-18 MED ORDER — FAMOTIDINE 20 MG PO TABS: 40.0000 mg | ORAL_TABLET | ORAL | Status: DC | PRN

## 2017-03-18 MED ORDER — HYDROMORPHONE HCL 1 MG/ML IJ SOLN: 1.0000 mg | Freq: Once | INTRAMUSCULAR | Status: DC | PRN

## 2017-03-18 MED ORDER — HEPARIN SODIUM (PORCINE) 1000 UNIT/ML IJ SOLN
INTRAMUSCULAR | Status: AC
  Filled 2017-03-18: qty 1

## 2017-03-18 MED ORDER — ACETAMINOPHEN 325 MG RE SUPP: 325.0000 mg | RECTAL | Status: DC | PRN

## 2017-03-18 MED ORDER — METOPROLOL TARTRATE 5 MG/5ML IV SOLN: 2.0000 mg | INTRAVENOUS | Status: DC | PRN

## 2017-03-18 MED ORDER — PHENOL 1.4 % MT LIQD: 1.0000 | OROMUCOSAL | Status: DC | PRN

## 2017-03-18 MED ORDER — MIDAZOLAM HCL 2 MG/2ML IJ SOLN
INTRAMUSCULAR | Status: DC | PRN
  Administered 2017-03-18: 2 mg via INTRAVENOUS

## 2017-03-18 MED ORDER — ONDANSETRON HCL 4 MG/2ML IJ SOLN
4.0000 mg | Freq: Four times a day (QID) | INTRAMUSCULAR | Status: DC | PRN
Start: 2017-03-18 — End: 2017-03-18

## 2017-03-18 MED ORDER — OXYCODONE-ACETAMINOPHEN 5-325 MG PO TABS: 1.0000 | ORAL_TABLET | ORAL | Status: DC | PRN

## 2017-03-18 MED ORDER — SODIUM CHLORIDE 0.9 % IV SOLN
INTRAVENOUS | Status: DC
  Administered 2017-03-18: 09:00:00 via INTRAVENOUS

## 2017-03-18 MED ORDER — SODIUM CHLORIDE 0.9 % IV SOLN: 500.0000 mL | Freq: Once | INTRAVENOUS | Status: DC | PRN

## 2017-03-18 MED ORDER — FENTANYL CITRATE (PF) 100 MCG/2ML IJ SOLN
INTRAMUSCULAR | Status: AC
  Filled 2017-03-18: qty 2

## 2017-03-18 SURGICAL SUPPLY — 12 items
CATH C2 65CM (CATHETERS) ×2 IMPLANT
CATH PIG 70CM (CATHETERS) ×2 IMPLANT
COVER PROBE U/S 5X48 (MISCELLANEOUS) ×2 IMPLANT
DEVICE PRESTO INFLATION (MISCELLANEOUS) IMPLANT
DEVICE STARCLOSE SE CLOSURE (Vascular Products) ×2 IMPLANT
DEVICE TORQUE (MISCELLANEOUS) ×2 IMPLANT
GLIDEWIRE STIFF .35X180X3 HYDR (WIRE) ×2 IMPLANT
PACK ANGIOGRAPHY (CUSTOM PROCEDURE TRAY) ×2 IMPLANT
SHEATH BRITE TIP 5FRX11 (SHEATH) ×2 IMPLANT
SYR MEDRAD MARK V 150ML (SYRINGE) ×2 IMPLANT
TUBING CONTRAST HIGH PRESS 72 (TUBING) ×2 IMPLANT
WIRE J 3MM .035X145CM (WIRE) ×2 IMPLANT

## 2017-03-18 NOTE — Op Note (Signed)
[facility] Percutaneous Study/Intervention Procedural Note   Date: 03/18/2017  Surgeon(s): [name], MD  Assistants: none  Pre-operative Diagnosis: 1.  History of chronic mesenteric ischemia status post SMA stent 2. Recurrent abdominal pain   Post-operative diagnosis: Same  Procedure(s) Performed: 1. Ultrasound guidance for vascular access right femoral artery  2. Catheter placement into SMA from right femoral approach 3. Aortogram and selective angiogram of the SMA 4. StarClose closure device right femoral artery  Contrast: 35 cc  Fluoro time: 1.5 minutes  EBL: 10 cc  Anesthesia: Approximately 20 minutes of Moderate conscious sedation using 2 mg of Versed and 50 mcg of Fentanyl  Indications: Patient is a 83 y.o. 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 She is been having recurrent abdominal pain now for weeks to months and a CT scan was not revealing of whether or not the stent was patent. The patient is brought in for angiography for further evaluation and potential treatment. Risks and benefits are discussed and informed consent is obtained  Procedure: The patient was identified and appropriate procedural time out was performed. The patient was then placed supine on the table and prepped and draped in the usual sterile fashion. Moderate conscious sedation was administered during a face to face encounter with the patient throughout the procedure with my supervision of the RN administering medicines and monitoring the patient's vital signs, pulse oximetry, telemetry and mental status throughout from the start of the procedure until the patient was taken to the recovery room. Ultrasound was used to evaluate the right common femoral artery. It was patent . A digital ultrasound image was acquired.  A Seldinger needle was used to access the right common femoral artery under direct ultrasound guidance and a permanent image was performed. A 0.035 J wire was advanced without resistance and a 5Fr sheath was placed. Pigtail catheter was placed into the aorta and an AP aortogram was performed. This demonstrated what appeared to be moderate stenosis of the left renal artery with the origin of the right renal artery not well seen. The SMA appeared to have reasonably brisk flow but the origin and the stent could not be well seen. We transitioned to the steep LAO projection to image the celiac and SMA. The lateral image demonstrated what appeared to be a chronic occlusion of the celiac artery and a stent in the SMA that had good flow although precise degree of stenosis and patency were difficult to discern from this aortogram picture. I then selected a C2 catheter and selectively cannulated the superior mesenteric artery for selective imaging. A couple of pictures were performed to help fully delineate the anatomy and it was found that the stent was patent with mild irregularity but only about a 20% stenosis within the SMA stent. This was not flow limiting and there was good brisk flow distally. At this point, I elected to terminate the procedure. The diagnostic catheter was removed. StarClose closure device was deployed in usual fashion with excellent hemostatic result. The patient was taken to the recovery room in stable condition having tolerated the procedure well.     Findings:SMA stent was patent with minimal irregularity and only about a 20% stenosis. Celiac appeared chronically occluded. Left renal artery with at least moderate stenosis. Right renal artery not particularly well seen. Aorta itself was patent.  Disposition: Patient was taken to the recovery room in stable condition having tolerated the  procedure well.  Complications: None  [name] 03/18/2017 10:01 AM   This note was created  with Dragon Medical transcription system. Any errors in dictation are purely unintentional.

## 2017-03-18 NOTE — Progress Notes (Signed)
Dr. Lucky Cowboy at bedside speaking with family & pt.re: results.

## 2017-03-18 NOTE — H&P (Signed)
[facility] History & Physical Update  The patient was interviewed and re-examined.  The patient's previous History and Physical has been reviewed and is unchanged.  There is no change in the plan of care. We plan to proceed with the scheduled procedure.  [name], MD  03/18/2017, 8:13 AM

## 2017-03-19 ENCOUNTER — Inpatient Hospital Stay: Payer: Medicare Other

## 2017-03-19 ENCOUNTER — Encounter: Payer: Self-pay | Admitting: Vascular Surgery

## 2017-03-19 DIAGNOSIS — D509 Iron deficiency anemia, unspecified: Secondary | ICD-10-CM | POA: Diagnosis not present

## 2017-03-19 DIAGNOSIS — D649 Anemia, unspecified: Secondary | ICD-10-CM

## 2017-03-19 LAB — CBC WITH DIFFERENTIAL/PLATELET
Basophils Absolute: 0.1 10*3/uL (ref 0–0.1)
Basophils Relative: 1 %
Eosinophils Absolute: 0.2 10*3/uL (ref 0–0.7)
Eosinophils Relative: 3 %
HCT: 29.5 % — ABNORMAL LOW (ref 35.0–47.0)
Hemoglobin: 9.7 g/dL — ABNORMAL LOW (ref 12.0–16.0)
Lymphocytes Relative: 17 %
Lymphs Abs: 1.1 10*3/uL (ref 1.0–3.6)
MCH: 26.7 pg (ref 26.0–34.0)
MCHC: 33 g/dL (ref 32.0–36.0)
MCV: 81 fL (ref 80.0–100.0)
Monocytes Absolute: 0.4 10*3/uL (ref 0.2–0.9)
Monocytes Relative: 6 %
Neutro Abs: 4.8 10*3/uL (ref 1.4–6.5)
Neutrophils Relative %: 73 %
Platelets: 268 10*3/uL (ref 150–440)
RBC: 3.64 MIL/uL — ABNORMAL LOW (ref 3.80–5.20)
RDW: 17.3 % — ABNORMAL HIGH (ref 11.5–14.5)
WBC: 6.5 10*3/uL (ref 3.6–11.0)

## 2017-03-19 LAB — FERRITIN: Ferritin: 164 ng/mL (ref 11–307)

## 2017-03-21 ENCOUNTER — Inpatient Hospital Stay (HOSPITAL_BASED_OUTPATIENT_CLINIC_OR_DEPARTMENT_OTHER): Payer: Medicare Other | Admitting: Hematology and Oncology

## 2017-03-21 ENCOUNTER — Inpatient Hospital Stay: Payer: Medicare Other

## 2017-03-21 ENCOUNTER — Encounter: Payer: Self-pay | Admitting: Hematology and Oncology

## 2017-03-21 ENCOUNTER — Other Ambulatory Visit: Payer: Self-pay | Admitting: *Deleted

## 2017-03-21 ENCOUNTER — Other Ambulatory Visit: Payer: Self-pay

## 2017-03-21 VITALS — Temp 97.4°F | Wt 118.2 lb

## 2017-03-21 DIAGNOSIS — D509 Iron deficiency anemia, unspecified: Secondary | ICD-10-CM

## 2017-03-21 DIAGNOSIS — Z79899 Other long term (current) drug therapy: Secondary | ICD-10-CM | POA: Diagnosis not present

## 2017-03-21 DIAGNOSIS — D508 Other iron deficiency anemias: Secondary | ICD-10-CM

## 2017-03-21 DIAGNOSIS — I739 Peripheral vascular disease, unspecified: Secondary | ICD-10-CM

## 2017-03-21 LAB — SEDIMENTATION RATE: Sed Rate: 34 mm/hr — ABNORMAL HIGH (ref 0–30)

## 2017-03-21 LAB — IRON AND TIBC
Iron: 28 ug/dL (ref 28–170)
Saturation Ratios: 9 % — ABNORMAL LOW (ref 10.4–31.8)
TIBC: 324 ug/dL (ref 250–450)
UIBC: 296 ug/dL

## 2017-03-21 NOTE — Progress Notes (Signed)
[facility] [name]: 03/21/2017   Chief Complaint: [name] is a 83 y.o. female with iron deficiency anemia who is seen for review of work-up and assessment after initiation of Venofer.  HPI:   The patient was last seen in the hematology clinic on 02/28/2017.  At that time, she was seen for initial consultation.   She had iron deficiency anemia and had failed a trial of oral iron x 1 year.  Etiology of iron deficiency was felt secondary to diet and a possible GI abnormality.  Endoscopies were scheduled.  We discussed IV iron.  CBC on 02/28/2017 revealed a hematocrit 26.8, hemoglobin 8.7, and MCV 80.2.  Ferritin was 8.  Folate was 54.6.  SPEP revealed no monoclonal protein. 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 IgG was 689 (700 - 1600).  Retic was 1.3% (low).  She received Venofer weekly x 3 (02/28/2017 - 03/14/2017).  CBC on 03/19/2017 revealed a hematocrit of 29.0, hemoglobin 9.7, MCV 81, platelets 268,000, and white count 6500.  Creatinine was 1.2 with a CrCl 40 ml/min on 03/18/2017.  She states that she was scheduled for EGD on 03/01/2017 and colonoscopy on 06/20/2017.  EGD was not done.   Symptomatically, patient reports dysphagia. Patient states, "It feels like a knife sticking in my esophagus and stomach" when she eats/drinks.  Symptoms are worse with cold temperature foods/liquids. Patient reporting a 10 pound  weight loss in the last 2 weeks. She was seen by Dr. Ginette Pitman this week, and was referred to the [facility].  By her report, she can not be seen until 03/2017. She notes a previous esophageal stricture approximately 1.5 years ago. Patient reporting nausea and vertigo. Patient recently stopped taking oral iron supplementation because PCP advised that it was causing gastric irritation.   Stephens November, NP was contacted.  Barium swallow on 03/01/2017 revealed focal smooth  narrowing of the distal esophagus just proximal to the esophagogastric junction most consistent with a stricture.  Patient has a SMA stent and is on anticoagulation.  She had angiography yesterday.   Past Medical History:  Diagnosis Date  . Anemia   . Arrhythmia   . Basal cell carcinoma of skin   . Brain tumor ([place])   . Brain tumor ([place])   . Cervical spine disease   . Diabetes mellitus without complication ([place])   . Hyperlipemia   . Hypertension   . Kidney stones   . Leaky heart valve   . Meningioma [facility])     Past Surgical History:  Procedure Laterality Date  . ABDOMINAL HYSTERECTOMY    . APPENDECTOMY    . ESOPHAGOGASTRODUODENOSCOPY (EGD) WITH PROPOFOL N/A 03/14/2015   Procedure: ESOPHAGOGASTRODUODENOSCOPY (EGD) WITH PROPOFOL;  Surgeon: Josefine Class, MD;  Location: [facility] ENDOSCOPY;  Service: Endoscopy;  Laterality: N/A;  . EYE SURGERY    . HYSTERECTOMY ABDOMINAL WITH SALPINGECTOMY    . PERIPHERAL VASCULAR CATHETERIZATION N/A 01/23/2016   Procedure: Visceral Venography;  Surgeon: Algernon Huxley, MD;  Location: [place] [facility];  Service: Cardiovascular;  Laterality: N/A;  . PERIPHERAL VASCULAR CATHETERIZATION  01/23/2016   Procedure: Peripheral Vascular Intervention;  Surgeon: Algernon Huxley, MD;  Location: [place] [facility];  Service: Cardiovascular;;  . VISCERAL ANGIOGRAPHY N/A 03/18/2017   Procedure: Visceral Angiography;  Surgeon: Algernon Huxley, MD;  Location: [place] [facility];  Service: Cardiovascular;  Laterality: N/A;  . VISCERAL ARTERY INTERVENTION  N/A 03/18/2017   Procedure: Visceral Artery Intervention;  Surgeon: Algernon Huxley, MD;  Location: [place] [facility];  Service: Cardiovascular;  Laterality: N/A;    History reviewed. No pertinent family history.  Social History:  reports that she has never smoked. She has never used smokeless tobacco. She reports that she does not drink alcohol or use drugs.  She denies any exposure to radiation or toxins.  She is  retired.  She worked at [employer].  She lives in [place].  Patient's contact number is [phone].  Daughter's number is [phone].  The patient is accompanied by her daughter, [name], today.  Allergies:  Allergies  Allergen Reactions  . Gabapentin Swelling  . Lipitor [Atorvastatin] Other (See Comments)    Muscle aches  . Mevacor [Lovastatin] Other (See Comments)    Muscle aches  . Milk-Related Compounds     Large quantities cause headaches   . Statins Other (See Comments)    Muscle pain  . Sulfur Diarrhea and Nausea And Vomiting  . Zocor [Simvastatin] Other (See Comments)    Muscle aches    Current Medications: Current Outpatient Prescriptions  Medication Sig Dispense Refill  . acetaminophen (TYLENOL) 650 MG CR tablet Take 650 mg by mouth every 8 (eight) hours as needed for pain.    Marland Kitchen allopurinol (ZYLOPRIM) 100 MG tablet Take 100 mg by mouth at bedtime.   0  . Bioflavonoid Products (VITAMIN C) CHEW Chew 2 tablets by mouth daily.    . clopidogrel (PLAVIX) 75 MG tablet take 1 tablet by mouth once daily 30 tablet 11  . furosemide (LASIX) 20 MG tablet Take 20 mg by mouth daily.   0  . glipiZIDE (GLUCOTROL) 5 MG tablet Take 5 mg by mouth daily before breakfast.    . hydrALAZINE (APRESOLINE) 50 MG tablet Take 50 mg by mouth 2 (two) times daily.     . hydrochlorothiazide (HYDRODIURIL) 25 MG tablet Take 25 mg by mouth daily.  0  . Hypromellose (GENTEAL MILD) 0.2 % SOLN Place 1 drop into both eyes 3 (three) times daily.    Marland Kitchen lisinopril (PRINIVIL,ZESTRIL) 40 MG tablet Take 40 mg by mouth daily.   0  . metoprolol succinate (TOPROL-XL) 50 MG 24 hr tablet Take 75 mg by mouth 2 (two) times daily.   0  . pantoprazole (PROTONIX) 40 MG tablet Take 40 mg by mouth 2 (two) times daily.    Marland Kitchen rOPINIRole (REQUIP) 2 MG tablet Take 2 mg by mouth 2 (two) times daily.   0  . sucralfate (CARAFATE) 1 g tablet Take 1 g by mouth 3 (three) times daily as needed (for difficulty swallowing).    0  . vitamin B-12 (CYANOCOBALAMIN) 1000 MCG tablet Take 1,000 mcg by mouth daily.    . iron polysaccharides (NIFEREX) 150 MG capsule Take 150 mg by mouth at bedtime.      No current facility-administered medications for this visit.     Review of Systems:  GENERAL: Fatigue.  No fevers, sweats . (+) weight loss of 7 lbs since 03/01/2017. PERFORMANCE STATUS (ECOG):  1 HEENT:  Vision issues, afraid to drive.  No runny nose, sore throat, mouth sores or tenderness. Lungs: No shortness of breath or cough.  No hemoptysis. Cardiac:  No chest pain, palpitations, orthopnea, or PND. GI:  Esophagus "hurting" (see HPI).  No nausea, vomiting, diarrhea, constipation, or melena. GU:  No urgency, frequency, dysuria, or hematuria. Musculoskeletal:  No back pain.  Arthritis pain.  No muscle tenderness. Extremities:  Gout.  Feet and legs swelling. Skin:  No rashes or skin changes. Neuro:  Dull headache. No numbness or weakness, balance or coordination issues. Endocrine:  No diabetes.  Thyroid disease on Synthroid.  No hot flashes or night sweats. Psych:  No mood changes, depression or anxiety. Pain:  No focal pain. Review of systems:  All other systems reviewed and found to be negative.  Physical Exam: Temperature 97.4 F (36.3 C), temperature source Tympanic, weight 118 lb 3 oz (53.6 kg). GENERAL:  Thin elderly woman sitting comfortably a wheelchair in the exam room in no acute distress. MENTAL STATUS:  Alert and oriented to person, place and time. HEAD:  Short gray hair.  Normocephalic, atraumatic, face symmetric, no Cushingoid features. EYES:  Glasses.  Pupils equal round and reactive to light and accomodation.  No conjunctivitis or scleral icterus. ENT:  Oropharynx clear without lesion.  Tongue normal. Mucous membranes moist.  RESPIRATORY:  Clear to auscultation without rales, wheezes or rhonchi. CARDIOVASCULAR:  Regular rate and rhythm without murmur, rub or gallop. ABDOMEN:  Soft, non-tender,  with active bowel sounds, and no hepatosplenomegaly.  No masses. SKIN:  No rashes, ulcers or lesions. EXTREMITIES: Chronic lower extremity edema.  No skin discoloration or tenderness.  No palpable cords. LYMPH NODES: No palpable cervical, supraclavicular, axillary or inguinal adenopathy  NEUROLOGICAL: Unremarkable. PSYCH:  Appropriate.   Appointment on 03/19/2017  Component Date Value Ref Range Status  . WBC 03/19/2017 6.5  3.6 - 11.0 K/uL Final  . RBC 03/19/2017 3.64* 3.80 - 5.20 MIL/uL Final  . Hemoglobin 03/19/2017 9.7* 12.0 - 16.0 g/dL Final  . HCT 03/19/2017 29.5* 35.0 - 47.0 % Final  . MCV 03/19/2017 81.0  80.0 - 100.0 fL Final  . MCH 03/19/2017 26.7  26.0 - 34.0 pg Final  . MCHC 03/19/2017 33.0  32.0 - 36.0 g/dL Final  . RDW 03/19/2017 17.3* 11.5 - 14.5 % Final  . Platelets 03/19/2017 268  150 - 440 K/uL Final  . Neutrophils Relative % 03/19/2017 73  % Final  . Neutro Abs 03/19/2017 4.8  1.4 - 6.5 K/uL Final  . Lymphocytes Relative 03/19/2017 17  % Final  . Lymphs Abs 03/19/2017 1.1  1.0 - 3.6 K/uL Final  . Monocytes Relative 03/19/2017 6  % Final  . Monocytes Absolute 03/19/2017 0.4  0.2 - 0.9 K/uL Final  . Eosinophils Relative 03/19/2017 3  % Final  . Eosinophils Absolute 03/19/2017 0.2  0 - 0.7 K/uL Final  . Basophils Relative 03/19/2017 1  % Final  . Basophils Absolute 03/19/2017 0.1  0 - 0.1 K/uL Final  . Ferritin 03/19/2017 164  11 - 307 ng/mL Final    Assessment:  TANZA PELLOT [name] is a 83 y.o. female with iron deficiency anemia.  She has been on oral iron x 1 year with continued decline in her counts.  She has dysphagia and odynophagia.  She is on  PPI and carafate.  Diet is modest.  She denies pica.  Abdomen and pelvic CT on 02/19/2017 revealed no acute abnormality.  She has mild sigmoid diverticulosis.  CBC on 02/26/2017 revealed a hematocrit of 25.3, hemoglobin 8.0, and MCV 83.2.  Hemoccult studies x 1 were positive in 11/19/2016 and negative x 2 in 01/2017.   Ferritin was 5 with an iron saturation of 4%, and a TIBC of 487 on 01/30/2017.  Normal studies included:  creatinine (1.0), calcium, albumen, B12 (647), TSH.  Folic acid was 36.6 on 08/09/2015.  Urinalysis revealed no blood on 01/23/2017.  She received Venofer weekly x 3 (02/28/2017 - 03/14/2017). Ferritin 164 on 03/19/2017.  EGD in 2016 revealed gastric ulcers and esophageal stricture which was dilated.  Barium swallow on 11/15/2015 was normal.  She has a history of mesenteric ischemia.  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  She was on Coumadin, and now on Plavix.  Colonoscopy in 2008 revealed diverticulosis.  She is scheduled for EGD and colonoscopy.  Symptomatically, she is fatigued.  She has dysphagia.  Hematocrit has improved after IV iron.  Plan: 1.  Review labs from 03/19/2017. 2.  No Venofer today. Ferritin 164 on 03/19/2017.  Goal ferritin 100. 3.  Labs today:iron studies, sed rate 4.  Nurse to call patient with results. 5.  Contact GI to assist in expediting endoscopy- done. 6.  RTC in 1 month for MD assessment and labs (CBC with diff, ferritin, iron studies- [name] before) +/- Venofer   Lequita Asal, MD  03/21/2017, 2:03 PM

## 2017-03-21 NOTE — Progress Notes (Signed)
Patient here today as follow up for anemia.  She presents today in a wheelchair, accompanied by her daughter, [name].  Patient states she is having problems swallowing for past 3 weeks.  She saw Dr. Ginette Pitman who referred her to Dr. Gustavo Lah.  She does not have an appointment until July.  She will be calling Dr. Ginette Pitman back to see if she can go elsewhere due to appointment being so far out.   States she has lost 10# in a little over 2 weeks.  Patient was prescribed carafate yesterday.  Patient states it has helped her epigastric pain some.

## 2017-03-28 ENCOUNTER — Other Ambulatory Visit: Payer: Self-pay | Admitting: Gastroenterology

## 2017-03-28 ENCOUNTER — Ambulatory Visit: Payer: Medicare Other | Admitting: Anesthesiology

## 2017-03-28 ENCOUNTER — Ambulatory Visit
Admission: RE | Admit: 2017-03-28 | Discharge: 2017-03-28 | Disposition: A | Discharge status: Home / Self Care | Payer: Medicare Other | Source: Ambulatory Visit | Attending: Gastroenterology | Admitting: Gastroenterology

## 2017-03-28 ENCOUNTER — Encounter: Payer: Self-pay | Admitting: *Deleted

## 2017-03-28 ENCOUNTER — Encounter
Admission: RE | Disposition: A | Discharge status: Home / Self Care | Payer: Self-pay | Source: Ambulatory Visit | Attending: Gastroenterology

## 2017-03-28 DIAGNOSIS — E119 Type 2 diabetes mellitus without complications: Secondary | ICD-10-CM | POA: Insufficient documentation

## 2017-03-28 DIAGNOSIS — Z91011 Allergy to milk products: Secondary | ICD-10-CM | POA: Insufficient documentation

## 2017-03-28 DIAGNOSIS — Z79899 Other long term (current) drug therapy: Secondary | ICD-10-CM | POA: Diagnosis not present

## 2017-03-28 DIAGNOSIS — K228 Other specified diseases of esophagus: Secondary | ICD-10-CM | POA: Insufficient documentation

## 2017-03-28 DIAGNOSIS — I739 Peripheral vascular disease, unspecified: Secondary | ICD-10-CM | POA: Diagnosis not present

## 2017-03-28 DIAGNOSIS — K3189 Other diseases of stomach and duodenum: Secondary | ICD-10-CM | POA: Insufficient documentation

## 2017-03-28 DIAGNOSIS — I10 Essential (primary) hypertension: Secondary | ICD-10-CM | POA: Diagnosis not present

## 2017-03-28 DIAGNOSIS — K2950 Unspecified chronic gastritis without bleeding: Secondary | ICD-10-CM | POA: Insufficient documentation

## 2017-03-28 DIAGNOSIS — K222 Esophageal obstruction: Secondary | ICD-10-CM | POA: Insufficient documentation

## 2017-03-28 DIAGNOSIS — Z85828 Personal history of other malignant neoplasm of skin: Secondary | ICD-10-CM | POA: Insufficient documentation

## 2017-03-28 DIAGNOSIS — E785 Hyperlipidemia, unspecified: Secondary | ICD-10-CM | POA: Insufficient documentation

## 2017-03-28 DIAGNOSIS — Z87442 Personal history of urinary calculi: Secondary | ICD-10-CM | POA: Insufficient documentation

## 2017-03-28 DIAGNOSIS — K2210 Ulcer of esophagus without bleeding: Secondary | ICD-10-CM | POA: Insufficient documentation

## 2017-03-28 DIAGNOSIS — Z7902 Long term (current) use of antithrombotics/antiplatelets: Secondary | ICD-10-CM | POA: Diagnosis not present

## 2017-03-28 DIAGNOSIS — R1319 Other dysphagia: Secondary | ICD-10-CM

## 2017-03-28 DIAGNOSIS — Z888 Allergy status to other drugs, medicaments and biological substances status: Secondary | ICD-10-CM | POA: Diagnosis not present

## 2017-03-28 DIAGNOSIS — R1310 Dysphagia, unspecified: Secondary | ICD-10-CM | POA: Diagnosis present

## 2017-03-28 HISTORY — PX: ESOPHAGOGASTRODUODENOSCOPY (EGD) WITH PROPOFOL: SHX5813

## 2017-03-28 LAB — GLUCOSE, CAPILLARY: Glucose-Capillary: 135 mg/dL — ABNORMAL HIGH (ref 65–99)

## 2017-03-28 SURGERY — ESOPHAGOGASTRODUODENOSCOPY (EGD) WITH PROPOFOL
Anesthesia: General

## 2017-03-28 MED ORDER — LIDOCAINE 2% (20 MG/ML) 5 ML SYRINGE
INTRAMUSCULAR | Status: DC | PRN
  Administered 2017-03-28: 40 mg via INTRAVENOUS

## 2017-03-28 MED ORDER — MIDAZOLAM HCL 2 MG/2ML IJ SOLN
INTRAMUSCULAR | Status: AC
  Filled 2017-03-28: qty 2

## 2017-03-28 MED ORDER — PROPOFOL 500 MG/50ML IV EMUL
INTRAVENOUS | Status: DC | PRN
  Administered 2017-03-28: 140 ug/kg/min via INTRAVENOUS

## 2017-03-28 MED ORDER — MIDAZOLAM HCL 5 MG/5ML IJ SOLN
INTRAMUSCULAR | Status: DC | PRN
  Administered 2017-03-28: 0.5 mg via INTRAVENOUS

## 2017-03-28 MED ORDER — FENTANYL CITRATE (PF) 100 MCG/2ML IJ SOLN
INTRAMUSCULAR | Status: AC
  Filled 2017-03-28: qty 2

## 2017-03-28 MED ORDER — FENTANYL CITRATE (PF) 100 MCG/2ML IJ SOLN
INTRAMUSCULAR | Status: DC | PRN
  Administered 2017-03-28: 25 ug via INTRAVENOUS

## 2017-03-28 MED ORDER — SODIUM CHLORIDE 0.9 % IV SOLN
INTRAVENOUS | Status: DC
  Administered 2017-03-28 (×2): via INTRAVENOUS

## 2017-03-28 MED ORDER — PROPOFOL 10 MG/ML IV BOLUS
INTRAVENOUS | Status: DC | PRN
  Administered 2017-03-28: 100 mg via INTRAVENOUS

## 2017-03-28 MED ORDER — PROPOFOL 500 MG/50ML IV EMUL
INTRAVENOUS | Status: AC
  Filled 2017-03-28: qty 50

## 2017-03-28 MED ORDER — PHENYLEPHRINE HCL 10 MG/ML IJ SOLN
INTRAMUSCULAR | Status: DC | PRN
Start: 2017-03-28 — End: 2017-03-28
  Administered 2017-03-28: 100 ug via INTRAVENOUS

## 2017-03-28 MED ORDER — SODIUM CHLORIDE 0.9 % IV SOLN: INTRAVENOUS | Status: DC

## 2017-03-28 NOTE — Anesthesia Preprocedure Evaluation (Signed)
Anesthesia Evaluation  Patient identified by MRN, date of birth, ID band Patient awake    Reviewed: Allergy & Precautions, H&P , NPO status , Patient's Chart, lab work & pertinent test results, reviewed documented beta blocker date and time   Airway Mallampati: II   Neck ROM: full    Dental  (+) Poor Dentition, Teeth Intact   Pulmonary neg pulmonary ROS,    Pulmonary exam normal        Cardiovascular hypertension, + Peripheral Vascular Disease  negative cardio ROS Normal cardiovascular exam Rhythm:regular Rate:Normal     Neuro/Psych negative neurological ROS  negative psych ROS   GI/Hepatic negative GI ROS, Neg liver ROS,   Endo/Other  negative endocrine ROSdiabetes  Renal/GU Renal diseasenegative Renal ROS  negative genitourinary   Musculoskeletal   Abdominal   Peds  Hematology negative hematology ROS (+) anemia ,   Anesthesia Other Findings Past Medical History: No date: Anemia No date: Arrhythmia No date: Basal cell carcinoma of skin No date: Brain tumor ([facility]) No date: Brain tumor ([place]) No date: Cervical spine disease No date: Diabetes mellitus without complication ([facility]) No date: Hyperlipemia No date: Hypertension No date: Kidney stones No date: Leaky heart valve No date: Meningioma [facility]) Past Surgical History: No date: ABDOMINAL HYSTERECTOMY No date: APPENDECTOMY 03/14/2015: ESOPHAGOGASTRODUODENOSCOPY (EGD) WITH PROPOFOL N/A     Comment: Procedure: ESOPHAGOGASTRODUODENOSCOPY (EGD)               WITH PROPOFOL;  Surgeon: Josefine Class,               MD;  Location: [facility] ENDOSCOPY;  Service:               Endoscopy;  Laterality: N/A; No date: EYE SURGERY No date: HYSTERECTOMY ABDOMINAL WITH SALPINGECTOMY 01/23/2016: PERIPHERAL VASCULAR CATHETERIZATION N/A     Comment: Procedure: Visceral Venography;  Surgeon:               Algernon Huxley, MD;  Location: [place] [facility]               LAB;  Service:  Cardiovascular;  Laterality:               N/A; 01/23/2016: PERIPHERAL VASCULAR CATHETERIZATION     Comment: Procedure: Peripheral Vascular Intervention;                Surgeon: Algernon Huxley, MD;  Location: [place] [facility];  Service: Cardiovascular;; 03/18/2017: VISCERAL ANGIOGRAPHY N/A     Comment: Procedure: Visceral Angiography;  Surgeon:               Algernon Huxley, MD;  Location: [place] [facility]               LAB;  Service: Cardiovascular;  Laterality:               N/A; 03/18/2017: VISCERAL ARTERY INTERVENTION N/A     Comment: Procedure: Visceral Artery Intervention;                Surgeon: Algernon Huxley, MD;  Location: [place] [facility];  Service: Cardiovascular;                Laterality: N/A;   Reproductive/Obstetrics negative OB ROS  Anesthesia Physical Anesthesia Plan  ASA: III  Anesthesia Plan: General   Post-op Pain Management:    Induction:   PONV Risk Score and Plan:   Airway Management Planned:   Additional Equipment:   Intra-op Plan:   Post-operative Plan:   Informed Consent: I have reviewed the patients History and Physical, chart, labs and discussed the procedure including the risks, benefits and alternatives for the proposed anesthesia with the patient or authorized representative who has indicated his/her understanding and acceptance.   Dental Advisory Given  Plan Discussed with: CRNA  Anesthesia Plan Comments:         Anesthesia Quick Evaluation

## 2017-03-28 NOTE — H&P (Signed)
Outpatient short stay form Pre-procedure 03/28/2017 9:07 AM Lollie Sails MD  Primary Physician: Dr [name]  Reason for visit:  EGD  History of present illness:  Patient is a 83 year old female presenting today for an EGD. She has had issues with dysphagia off and on for over a year but much more so in the past several weeks. She had a barium swallow indicating possible stenosis at the bottom of the esophagus however there are also several other hospital impacts on the esophagus including cervical surgery with hardware, extrinsic compression in the mid esophagus as well as in the distal esophagus. She does take Plavix but has held that for 6-7 days now. She takes no  aspirin or blood thinning agents otherwise.    Current Facility-Administered Medications:  .  0.9 %  sodium chloride infusion, , Intravenous, Continuous, Lollie Sails, MD, Last Rate: 20 mL/hr at 03/28/17 0805 .  0.9 %  sodium chloride infusion, , Intravenous, Continuous, Lollie Sails, MD  Prescriptions Prior to Admission  Medication Sig Dispense Refill Last Dose  . allopurinol (ZYLOPRIM) 100 MG tablet Take 100 mg by mouth at bedtime.   0 03/27/2017 at Unknown time  . Bioflavonoid Products (VITAMIN C) CHEW Chew 2 tablets by mouth daily.   03/27/2017 at Unknown time  . furosemide (LASIX) 20 MG tablet Take 20 mg by mouth daily.   0 03/26/2017 at Unknown time  . glipiZIDE (GLUCOTROL) 5 MG tablet Take 5 mg by mouth daily before breakfast.   03/27/2017 at Unknown time  . hydrALAZINE (APRESOLINE) 50 MG tablet Take 50 mg by mouth 2 (two) times daily.    03/27/2017 at Unknown time  . hydrochlorothiazide (HYDRODIURIL) 25 MG tablet Take 25 mg by mouth daily.  0 03/27/2017 at Unknown time  . Hypromellose (GENTEAL MILD) 0.2 % SOLN Place 1 drop into both eyes 3 (three) times daily.   03/27/2017 at Unknown time  . lisinopril (PRINIVIL,ZESTRIL) 40 MG tablet Take 40 mg by mouth daily.   0 03/27/2017 at Unknown time  . metoprolol  succinate (TOPROL-XL) 50 MG 24 hr tablet Take 75 mg by mouth 2 (two) times daily.   0 03/27/2017 at Unknown time  . pantoprazole (PROTONIX) 40 MG tablet Take 40 mg by mouth 2 (two) times daily.   03/27/2017 at Unknown time  . rOPINIRole (REQUIP) 2 MG tablet Take 2 mg by mouth 2 (two) times daily.   0 03/27/2017 at Unknown time  . sucralfate (CARAFATE) 1 g tablet Take 1 g by mouth 3 (three) times daily as needed (for difficulty swallowing).   0 03/27/2017 at Unknown time  . vitamin B-12 (CYANOCOBALAMIN) 1000 MCG tablet Take 1,000 mcg by mouth daily.   03/27/2017 at Unknown time  . acetaminophen (TYLENOL) 650 MG CR tablet Take 650 mg by mouth every 8 (eight) hours as needed for pain.   Taking  . clopidogrel (PLAVIX) 75 MG tablet take 1 tablet by mouth once daily 30 tablet 11 03/22/2017  . iron polysaccharides (NIFEREX) 150 MG capsule Take 150 mg by mouth at bedtime.    03/20/2017     Allergies  Allergen Reactions  . Gabapentin Swelling  . Lipitor [Atorvastatin] Other (See Comments)    Muscle aches  . Mevacor [Lovastatin] Other (See Comments)    Muscle aches  . Milk-Related Compounds     Large quantities cause headaches   . Statins Other (See Comments)    Muscle pain  . Sulfur Diarrhea and Nausea And Vomiting  .  Zocor [Simvastatin] Other (See Comments)    Muscle aches     Past Medical History:  Diagnosis Date  . Anemia   . Arrhythmia   . Basal cell carcinoma of skin   . Brain tumor ([place])   . Brain tumor ([place])   . Cervical spine disease   . Diabetes mellitus without complication ([place])   . Hyperlipemia   . Hypertension   . Kidney stones   . Leaky heart valve   . Meningioma ([place])     Review of systems:      Physical Exam    Heart and lungs: Regular rate and rhythm without rub or gallop, lungs are bilaterally clear    HEENT: Normocephalic atraumatic eyes are anicteric    Other:     Pertinant exam for procedure: Soft nontender nondistended bowel sounds positive and  normoactive    Planned proceedures: EGD and indicated procedures. I have discussed the risks benefits and complications of procedures to include not limited to bleeding, infection, perforation and the risk of sedation and the patient wishes to proceed.  Lollie Sails, MD Gastroenterology 03/28/2017  9:07 AM

## 2017-03-28 NOTE — Anesthesia Post-op Follow-up Note (Cosign Needed)
Anesthesia QCDR form completed.        

## 2017-03-28 NOTE — Transfer of Care (Signed)
Immediate Anesthesia Transfer of Care Note  Patient: [name] Day  Procedure(s) Performed: Procedure(s): ESOPHAGOGASTRODUODENOSCOPY (EGD) WITH PROPOFOL (N/A)  Patient Location: PACU and Endoscopy Unit  Anesthesia Type:General  Level of Consciousness: sedated  Airway & Oxygen Therapy: Patient Spontanous Breathing and Patient connected to nasal cannula oxygen  Post-op Assessment: Report given to RN and Post -op Vital signs reviewed and stable  Post vital signs: Reviewed and stable  Last Vitals:  Vitals:   03/28/17 0749  BP: (!) 191/44  Pulse: (!) 57  Resp: 18  Temp: (!) 35.6 C    Last Pain:  Vitals:   03/28/17 0749  TempSrc: Tympanic  PainSc: 5          Complications: No apparent anesthesia complications

## 2017-03-28 NOTE — Anesthesia Postprocedure Evaluation (Signed)
Anesthesia Post Note  Patient: [name] Day  Procedure(s) Performed: Procedure(s) (LRB): ESOPHAGOGASTRODUODENOSCOPY (EGD) WITH PROPOFOL (N/A)  Patient location during evaluation: PACU Anesthesia Type: General Level of consciousness: awake and alert Pain management: pain level controlled Vital Signs Assessment: post-procedure vital signs reviewed and stable Respiratory status: spontaneous breathing, nonlabored ventilation, respiratory function stable and patient connected to nasal cannula oxygen Cardiovascular status: blood pressure returned to baseline and stable Postop Assessment: no signs of nausea or vomiting Anesthetic complications: no     Last Vitals:  Vitals:   03/28/17 1000 03/28/17 1010  BP: (!) 126/47 (!) 148/52  Pulse:  (!) 56  Resp:  17  Temp:      Last Pain:  Vitals:   03/28/17 0749  TempSrc: Tympanic  PainSc: 5                  Molli Barrows

## 2017-03-28 NOTE — Op Note (Addendum)
[facility] Patient Name: [name] Day Procedure Date: 03/28/2017 9:06 AM MRN: [id number] Account #: [id number] Date of Birth: [birth year] Admit Type: Outpatient Age: 83 Room: [facility] ENDO ROOM 1 Gender: Female Note Status: Finalized Procedure:            Upper GI endoscopy Indications:          Dysphagia, Odynophagia, Abnormal UGI series Providers:            Lollie Sails, MD Referring MD:         [name], MD (Referring MD) Medicines:            Monitored Anesthesia Care Complications:        No immediate complications. Procedure:            Pre-Anesthesia Assessment:                       - ASA Grade Assessment: III - A patient with severe                        systemic disease.                       After obtaining informed consent, the endoscope was                        passed under direct vision. Throughout the procedure,                        the patient's blood pressure, pulse, and oxygen                        saturations were monitored continuously. The Endoscope                        was introduced through the mouth, and advanced to the                        third part of duodenum. The upper GI endoscopy was                        accomplished without difficulty. The patient tolerated                        the procedure well. Findings:      A moderate-sized area of extrinsic compression was found in the mid       esophagus from about 25-28 cm, no apparent defect or abnormality in the       mucosa or esophageal wall. .      One cratered esophageal ulcer was found. The lesion was about 2 cm in       size (longitudinally) in largest dimension. Biopsies were taken of the       margin if the ulcer including center in several locations.      Segmental moderate mucosal changes characterized by granularity were       found at the gastroesophageal junction. Biopsies were taken with a cold       forceps for histology.  Localized mild inflammation characterized by congestion (edema),       depression and erythema was found on the greater curvature of the       gastric  body. Biopsies were taken with a cold forceps for histology.      The exam of the stomach was otherwise normal.      Biopsies were taken with a cold forceps in the gastric body and in the       gastric antrum for histology.      The examined duodenum was normal.      The cardia and gastric fundus were normal on retroflexion otherwise. Impression:           - Extrinsic compression in the mid esophagus.                       - Non-bleeding esophageal ulcer.                       - Granular mucosa in the esophagus. Biopsied.                       - Erosive gastritis. Biopsied.                       - Normal examined duodenum.                       - Biopsies were taken with a cold forceps for histology                        in the gastric body and in the gastric antrum. Recommendation:       - Discharge patient to home.                       - Use Protonix (pantoprazole) 40 mg PO daily.                       - Use sucralfate suspension 1 gram PO QID.                       - Await pathology results.                       - Return to GI clinic in 3 weeks.                       - Perform CT scan (computed tomography) of the chest at                        appointment to be scheduled. Procedure Code(s):    --- Professional ---                       [phone], Esophagogastroduodenoscopy, flexible, transoral;                        with biopsy, single or multiple Diagnosis Code(s):    --- Professional ---                       K22.2, Esophageal obstruction                       K22.10, Ulcer of esophagus without bleeding                       K22.8, Other  specified diseases of esophagus                       K29.60, Other gastritis without bleeding                       R13.10, Dysphagia, unspecified                       R93.3, Abnormal findings on  diagnostic imaging of other                        parts of digestive tract CPT copyright 2016 [employer]. All rights reserved. The codes documented in this report are preliminary and upon coder review may  be revised to meet current compliance requirements. Lollie Sails, MD 03/28/2017 9:56:24 AM This report has been signed electronically. Number of Addenda: 0 Note Initiated On: 03/28/2017 9:06 AM      [facility]

## 2017-03-29 ENCOUNTER — Encounter: Payer: Self-pay | Admitting: Gastroenterology

## 2017-04-01 LAB — SURGICAL PATHOLOGY

## 2017-04-05 ENCOUNTER — Ambulatory Visit
Admission: RE | Admit: 2017-04-05 | Discharge: 2017-04-05 | Disposition: A | Discharge status: Home / Self Care | Payer: Medicare Other | Source: Ambulatory Visit | Attending: Gastroenterology | Admitting: Gastroenterology

## 2017-04-05 DIAGNOSIS — K229 Disease of esophagus, unspecified: Secondary | ICD-10-CM | POA: Insufficient documentation

## 2017-04-05 DIAGNOSIS — I700 Atherosclerosis of aorta: Secondary | ICD-10-CM | POA: Insufficient documentation

## 2017-04-05 DIAGNOSIS — I517 Cardiomegaly: Secondary | ICD-10-CM | POA: Diagnosis not present

## 2017-04-05 DIAGNOSIS — I2510 Atherosclerotic heart disease of native coronary artery without angina pectoris: Secondary | ICD-10-CM | POA: Insufficient documentation

## 2017-04-05 DIAGNOSIS — R1319 Other dysphagia: Secondary | ICD-10-CM | POA: Diagnosis not present

## 2017-04-05 MED ORDER — IOPAMIDOL (ISOVUE-300) INJECTION 61%
75.0000 mL | Freq: Once | INTRAVENOUS | Status: AC | PRN
  Administered 2017-04-05: 75 mL via INTRAVENOUS

## 2017-04-08 ENCOUNTER — Emergency Department
Admission: EM | Admit: 2017-04-08 | Discharge: 2017-04-08 | Disposition: A | Discharge status: Home / Self Care | Payer: Medicare Other | Attending: Emergency Medicine | Admitting: Emergency Medicine

## 2017-04-08 ENCOUNTER — Encounter: Payer: Self-pay | Admitting: Emergency Medicine

## 2017-04-08 ENCOUNTER — Emergency Department: Payer: Medicare Other

## 2017-04-08 DIAGNOSIS — I10 Essential (primary) hypertension: Secondary | ICD-10-CM | POA: Diagnosis not present

## 2017-04-08 DIAGNOSIS — K8020 Calculus of gallbladder without cholecystitis without obstruction: Secondary | ICD-10-CM | POA: Diagnosis not present

## 2017-04-08 DIAGNOSIS — Z79899 Other long term (current) drug therapy: Secondary | ICD-10-CM | POA: Diagnosis not present

## 2017-04-08 DIAGNOSIS — Z7984 Long term (current) use of oral hypoglycemic drugs: Secondary | ICD-10-CM | POA: Insufficient documentation

## 2017-04-08 DIAGNOSIS — Z95828 Presence of other vascular implants and grafts: Secondary | ICD-10-CM | POA: Insufficient documentation

## 2017-04-08 DIAGNOSIS — R1084 Generalized abdominal pain: Secondary | ICD-10-CM | POA: Diagnosis not present

## 2017-04-08 DIAGNOSIS — R0789 Other chest pain: Secondary | ICD-10-CM | POA: Diagnosis present

## 2017-04-08 DIAGNOSIS — E119 Type 2 diabetes mellitus without complications: Secondary | ICD-10-CM | POA: Diagnosis not present

## 2017-04-08 DIAGNOSIS — Z7902 Long term (current) use of antithrombotics/antiplatelets: Secondary | ICD-10-CM | POA: Insufficient documentation

## 2017-04-08 LAB — CBC
HCT: 32.4 % — ABNORMAL LOW (ref 35.0–47.0)
Hemoglobin: 10.6 g/dL — ABNORMAL LOW (ref 12.0–16.0)
MCH: 26.7 pg (ref 26.0–34.0)
MCHC: 32.6 g/dL (ref 32.0–36.0)
MCV: 81.6 fL (ref 80.0–100.0)
Platelets: 289 10*3/uL (ref 150–440)
RBC: 3.96 MIL/uL (ref 3.80–5.20)
RDW: 19.1 % — ABNORMAL HIGH (ref 11.5–14.5)
WBC: 6.4 10*3/uL (ref 3.6–11.0)

## 2017-04-08 LAB — LACTIC ACID, PLASMA: Lactic Acid, Venous: 0.6 mmol/L (ref 0.5–1.9)

## 2017-04-08 LAB — URINALYSIS, COMPLETE (UACMP) WITH MICROSCOPIC
Bacteria, UA: NONE SEEN
Bilirubin Urine: NEGATIVE
Glucose, UA: NEGATIVE mg/dL
Hgb urine dipstick: NEGATIVE
Ketones, ur: NEGATIVE mg/dL
Leukocytes, UA: NEGATIVE
Nitrite: NEGATIVE
Protein, ur: 30 mg/dL — AB
Specific Gravity, Urine: 1.011 (ref 1.005–1.030)
pH: 6 (ref 5.0–8.0)

## 2017-04-08 LAB — COMPREHENSIVE METABOLIC PANEL
ALT: 20 U/L (ref 14–54)
AST: 30 U/L (ref 15–41)
Albumin: 3.5 g/dL (ref 3.5–5.0)
Alkaline Phosphatase: 58 U/L (ref 38–126)
Anion gap: 8 (ref 5–15)
BUN: 23 mg/dL — ABNORMAL HIGH (ref 6–20)
CO2: 28 mmol/L (ref 22–32)
Calcium: 9 mg/dL (ref 8.9–10.3)
Chloride: 103 mmol/L (ref 101–111)
Creatinine, Ser: 0.86 mg/dL (ref 0.44–1.00)
GFR calc Af Amer: >60 mL/min (ref >60)
GFR calc non Af Amer: 60 mL/min — ABNORMAL LOW (ref >60)
Glucose, Bld: 157 mg/dL — ABNORMAL HIGH (ref 65–99)
Potassium: 3.4 mmol/L — ABNORMAL LOW (ref 3.5–5.1)
Sodium: 139 mmol/L (ref 135–145)
Total Bilirubin: 0.5 mg/dL (ref 0.3–1.2)
Total Protein: 6.5 g/dL (ref 6.5–8.1)

## 2017-04-08 LAB — TROPONIN I: Troponin I: <0.03 ng/mL (ref <0.03)

## 2017-04-08 LAB — LIPASE, BLOOD: Lipase: 34 U/L (ref 11–51)

## 2017-04-08 MED ORDER — HYDROCODONE-ACETAMINOPHEN 5-325 MG PO TABS: 1.0000 | ORAL_TABLET | Freq: Four times a day (QID) | ORAL | 0 refills | Status: DC | PRN

## 2017-04-08 MED ORDER — FENTANYL CITRATE (PF) 100 MCG/2ML IJ SOLN
50.0000 ug | Freq: Once | INTRAMUSCULAR | Status: AC
  Administered 2017-04-08: 50 ug via INTRAVENOUS
  Filled 2017-04-08: qty 2

## 2017-04-08 MED ORDER — IOPAMIDOL (ISOVUE-370) INJECTION 76%
100.0000 mL | Freq: Once | INTRAVENOUS | Status: AC | PRN
  Administered 2017-04-08: 100 mL via INTRAVENOUS

## 2017-04-08 MED ORDER — SODIUM CHLORIDE 0.9 % IV BOLUS (SEPSIS)
1000.0000 mL | Freq: Once | INTRAVENOUS | Status: AC
  Administered 2017-04-08: 1000 mL via INTRAVENOUS

## 2017-04-08 NOTE — ED Notes (Signed)

## 2017-04-08 NOTE — Discharge Instructions (Signed)
Fortunately today your blood work in your CT scan were reassuring. We did find out that you have gallstones although I do not believe they're causing your pain. Please make an appointment to follow up with general surgery for reevaluation should you so desire. Return to the emergency department for any new or worsening symptoms such as if you cannot eat or drink, for worsening pain, or for any other concerns whatsoever.   It was a pleasure to take care of you today, and thank you for coming to our emergency department.  If you have any questions or concerns before leaving please ask the nurse to grab me and I'm more than happy to go through your aftercare instructions again.  If you were prescribed any opioid pain medication today such as Norco, Vicodin, Percocet, morphine, hydrocodone, or oxycodone please make sure you do not drive when you are taking this medication as it can alter your ability to drive safely.  If you have any concerns once you are home that you are not improving or are in fact getting worse before you can make it to your follow-up appointment, please do not hesitate to call 911 and come back for further evaluation.  Darel Hong MD  Results for orders placed or performed during the hospital encounter of 04/08/17  Lipase, blood  Result Value Ref Range   Lipase 34 11 - 51 U/L  Comprehensive metabolic panel  Result Value Ref Range   Sodium 139 135 - 145 mmol/L   Potassium 3.4 (L) 3.5 - 5.1 mmol/L   Chloride 103 101 - 111 mmol/L   CO2 28 22 - 32 mmol/L   Glucose, Bld 157 (H) 65 - 99 mg/dL   BUN 23 (H) 6 - 20 mg/dL   Creatinine, Ser 0.86 0.44 - 1.00 mg/dL   Calcium 9.0 8.9 - 10.3 mg/dL   Total Protein 6.5 6.5 - 8.1 g/dL   Albumin 3.5 3.5 - 5.0 g/dL   AST 30 15 - 41 U/L   ALT 20 14 - 54 U/L   Alkaline Phosphatase 58 38 - 126 U/L   Total Bilirubin 0.5 0.3 - 1.2 mg/dL   GFR calc non Af Amer 60 (L) >60 mL/min   GFR calc Af Amer >60 >60 mL/min   Anion gap 8 5 - 15  CBC    Result Value Ref Range   WBC 6.4 3.6 - 11.0 K/uL   RBC 3.96 3.80 - 5.20 MIL/uL   Hemoglobin 10.6 (L) 12.0 - 16.0 g/dL   HCT 32.4 (L) 35.0 - 47.0 %   MCV 81.6 80.0 - 100.0 fL   MCH 26.7 26.0 - 34.0 pg   MCHC 32.6 32.0 - 36.0 g/dL   RDW 19.1 (H) 11.5 - 14.5 %   Platelets 289 150 - 440 K/uL  Urinalysis, Complete w Microscopic  Result Value Ref Range   Color, Urine YELLOW (A) YELLOW   APPearance CLEAR (A) CLEAR   Specific Gravity, Urine 1.011 1.005 - 1.030   pH 6.0 5.0 - 8.0   Glucose, UA NEGATIVE NEGATIVE mg/dL   Hgb urine dipstick NEGATIVE NEGATIVE   Bilirubin Urine NEGATIVE NEGATIVE   Ketones, ur NEGATIVE NEGATIVE mg/dL   Protein, ur 30 (A) NEGATIVE mg/dL   Nitrite NEGATIVE NEGATIVE   Leukocytes, UA NEGATIVE NEGATIVE   RBC / HPF 0-5 0 - 5 RBC/hpf   WBC, UA 0-5 0 - 5 WBC/hpf   Bacteria, UA NONE SEEN NONE SEEN   Squamous Epithelial / LPF 0-5 (A)  NONE SEEN  Troponin I  Result Value Ref Range   Troponin I <0.03 <0.03 ng/mL  Lactic acid, plasma  Result Value Ref Range   Lactic Acid, Venous 0.6 0.5 - 1.9 mmol/L   Dg Chest 2 View  Result Date: 04/08/2017 CLINICAL DATA:  Epigastric pain radiating to the neck and back. EXAM: CHEST  2 VIEW COMPARISON:  Chest CT 3 days prior FINDINGS: Mild cardiomegaly and tortuous atherosclerotic thoracic aorta. No pulmonary edema. No consolidation, pleural fluid or pneumothorax. The bones are under mineralized. Postsurgical change in the lower cervical spine is partially included. IMPRESSION: Mild cardiomegaly with tortuous atherosclerotic thoracic aorta. No acute abnormality. Electronically Signed   By: Jeb Levering M.D.   On: 04/08/2017 18:10   Ct Chest W Contrast  Result Date: 04/05/2017 CLINICAL DATA:  Dysphasia for 1 month. Hypertension. Diabetes. Endoscopy demonstrating extrinsic compression in the mid esophagus. EXAM: CT CHEST WITH CONTRAST TECHNIQUE: Multidetector CT imaging of the chest was performed during intravenous contrast  administration. CONTRAST:  30mL ISOVUE-300 IOPAMIDOL (ISOVUE-300) INJECTION 61% COMPARISON:  Esophagram of 03/01/2017 FINDINGS: Cardiovascular: Advanced aortic and branch vessel atherosclerosis. Tortuous thoracic aorta. Mild cardiomegaly, without pericardial effusion. Multivessel coronary artery atherosclerosis. Mediastinum/Nodes: No mediastinal or hilar adenopathy. Mild distal esophageal wall thickening. Example image 106/series 2. No. No periesophageal mass. Lungs/Pleura: Trace left pleural fluid. Biapical pleural-parenchymal scarring. Dependent right lower lobe areas of scarring. Upper Abdomen: Normal imaged portions of the liver, spleen, pancreas, adrenal glands,. The proximal stomach is underdistended. Apparent wall thickening could be secondary. Example image 125/series 2. Bilateral renal scarring. Stent within the superior mesenteric artery. Musculoskeletal: Osteopenia. Lower cervical spine fixation. Accentuation of expected thoracic kyphosis. IMPRESSION: 1. Distal esophageal wall thickening is mild and likely corresponds to the stricture on prior esophagram. 2. No periesophageal mass to explain extrinsic compression on endoscopy. 3. Gastric underdistention with suggestion of wall thickening. This likely corresponds to gastritis described on endoscopy. 4. Coronary artery atherosclerosis. Aortic Atherosclerosis (ICD10-I70.0). 5. Cardiomegaly. Electronically Signed   By: Abigail Miyamoto M.D.   On: 04/05/2017 12:23   Ct Angio Abd/pel W And/or Wo Contrast  Result Date: 04/08/2017 CLINICAL DATA:  Abdominal angina with known mesenteric disease. Epigastric pain, worse today. Recent mesenteric angiography with reported SMA stent was patient with minimal irregularity and chronically occluded celiac artery. EXAM: CTA ABDOMEN AND PELVIS wITHOUT AND WITH CONTRAST TECHNIQUE: Multidetector CT imaging of the abdomen and pelvis was performed using the standard protocol during bolus administration of intravenous contrast.  Multiplanar reconstructed images and MIPs were obtained and reviewed to evaluate the vascular anatomy. CONTRAST:  100 cc Isovue 370 IV COMPARISON:  Abdominal CT 02/19/2017 FINDINGS: VASCULAR Aorta: Diffuse atheromatous plaque. No occlusion, dissection or aneurysm. 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 Distal branches are patent with atheromatous change. No abrupt occlusion. SMA: Stent in the proximal SMA, no evidence of stent occlusion. Flow within the stent is not well visualized by CTA. More distal SMA is patent with atheromatous plaque distally extending into multiple branches. No abrupt occlusion. Renals: Stenosis at the origin of both renal arteries. There is neck sirs rib renal artery to the lower left kidney. IMA: Plaque at the origin but no occlusion. Atheromatous changes distally. Inflow: Atheromatous plaques without significant stenosis. 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 Proximal Outflow: Bilateral common femoral and visualized portions of the superficial and profunda femoral arteries are patent without evidence of aneurysm, dissection, vasculitis or significant stenosis. Veins:  None venous filling defects, veins are grossly patent. Portal and mesenteric veins appear patent. There is contrast refluxing into the hepatic veins and IVC. Review of the MIP images confirms the above findings. NON-VASCULAR Lower chest: Multi chamber cardiomegaly, with dilated right atrium and ventricle, contrast refluxing into the hepatic veins and IVC. Small left pleural effusion. Mild bibasilar atelectasis. Hepatobiliary: No focal hepatic lesion. Small hyperenhancing focus in the inferior left lobe of the liver is grossly unchanged. Calcified gallstones within physiologically distended gallbladder. No pericholecystic inflammation. Pancreas: Parenchymal atrophy. No ductal dilatation or inflammation. Spleen:  Normal in size without focal abnormality. Adrenals/Urinary Tract: No adrenal nodule. Nonobstructing stones in the left kidney, 10 mm in the renal pelvis and 10 mm in the lower pole. Fullness of both renal collecting systems, stable from prior exam. No hydronephrosis or perinephric edema. There is homogeneous renal enhancement. Cortical scarring in the upper left kidney. Ureters are decompressed. Urinary bladder is physiologically distended, no wall thickening. Stomach/Bowel: Mild thickening the distal esophagus. Stomach is otherwise decompressed. There is no bowel wall thickening or inflammation. Mild sigmoid colonic diverticulosis without acute inflammation. Appendix not visualized. Lymphatic: No enlarged abdominopelvic lymph nodes. Reproductive: Status post hysterectomy. No adnexal masses. Other: Minimal free fluid in the pelvis is unchanged from prior exam. No upper abdominal ascites. No free air. Musculoskeletal: Degenerative change in the spine. There are no acute or suspicious osseous abnormalities. IMPRESSION: VASCULAR 1. Diffuse atheromatous plaque of the abdominal aorta and its branches. No evidence of acute abnormality. 2. Stent at SMA origin without occlusion, flow seen distal to the stent. 3. 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 NON-VASCULAR 1. Findings consistent with elevated right heart pressures with contrast refluxing into the hepatic veins and IVC. 2. No bowel wall thickening or evidence of bowel inflammation. 3. Chronic findings include cholelithiasis, nonobstructing left nephrolithiasis, and colonic diverticulosis without inflammation. Electronically Signed   By: Jeb Levering M.D.   On: 04/08/2017 19:44

## 2017-04-08 NOTE — ED Notes (Signed)
Pt assist to toilet in room by this RN. No complications

## 2017-04-08 NOTE — ED Provider Notes (Signed)
[facility] Emergency Department Provider Note  ____________________________________________   First MD Initiated Contact with Patient 04/08/17 1659     (approximate)  I have reviewed the triage vital signs and the nursing notes.   HISTORY  Chief Complaint Abdominal Pain   HPI [name] is a 83 y.o. female who is sent to the emergency department from her gastroenterologist office for epigastric pain and chest pain. The patient reports roughly 1 month of epigastric pain that is worse when eating. Nonexertional. The pain radiates to her back as well as to her left shoulder and up to her neck. She has a complex past medical history including chronic mesenteric ischemia for which she sees Dr. Lucky Cowboy.  She is status post SMA stenting in the past and 3 weeks ago had an angiogram confirming the stent was patent but did have 20% stenosis.Her pain is currently moderate postprandial epigastric radiating to back.   GI note from today: INTERVAL HISTORY:  [name] was last seen on 02/26/17 for odynphagia/epigastric pain/IDA/abrnomal barium swallow demonstrating a possible stricture . Had CT abdomen/pelvis prior to that appt that was unermakrable. She has a history of SMA stenting/mesenteric ischemia. Was seen last month by Dr Lucky Cowboy and had abdominal angiogram 03/18/17 due to abnormal mesenteric study last spring, however this demonstrated minimal plaque to her stent and it was not occluded. She then had EGD for evaluation 03/28/17 by Dr Gustavo Lah: this demonstrated a 2 cm esophageal linear ulcer and gastritis.Biopsies of the esophageal ulcer were negative for pill fragments/viral effect/dysplasia/malignancy. There was also no H pylori, Barretts, dysplasia/malignancy. It was noted there may be some extrinsic compression on the esophagus, but that there was no esophageal stricture. So the patient underwent CT of the chest 04/05/17 with demonstrated no paraesophageal mass. There was  still esophageal wall thickeining and gastritis. There was also coronary atherosclerosis and cardiomegaly.  She was to take pantoprazole 40mg  po bid and sucralfate 1 gm qid for her ulcers/esophagitis/gastritis. Has been following with hematology as well and last hgb improved to 9.6 (was 8). Since the last visit, states she has been adherent to her pantoprazole and carafate, and is taking them as directed. States that she is still having some epigastric pain, but that it has changed: describes now a left sided underneath the rib pain that radiates up to her left shoulder and down her left arm that has been going on for a week. Feels fatigued. States that rest helps this pain ease, and activity increases it. Appetite down. Pain present now, about 9/10. No sob or diaphoresis at present. Denies other symptoms currently, including further GI concerns. She is here with her daughter. We discussed her results, symptoms, and plan of care. States she has taken her plavix and metoprolol today.      Past Medical History:  Diagnosis Date  . Anemia   . Arrhythmia   . Basal cell carcinoma of skin   . Brain tumor ([place])   . Brain tumor ([place])   . Cervical spine disease   . Diabetes mellitus without complication ([place])   . Hyperlipemia   . Hypertension   . Kidney stones   . Leaky heart valve   . Meningioma [facility])     Patient Active Problem List   Diagnosis Date Noted  . Mesenteric ischemia ([place]) 03/01/2017  . Type 2 diabetes mellitus with complication ([place]) 46/96/2952  . Iron deficiency anemia 02/28/2017  . PAD (peripheral artery disease) ([place]) 02/05/2017    Past  Surgical History:  Procedure Laterality Date  . ABDOMINAL HYSTERECTOMY    . APPENDECTOMY    . ESOPHAGOGASTRODUODENOSCOPY (EGD) WITH PROPOFOL N/A 03/14/2015   Procedure: ESOPHAGOGASTRODUODENOSCOPY (EGD) WITH PROPOFOL;  Surgeon: Josefine Class, MD;  Location: [facility] ENDOSCOPY;  Service: Endoscopy;  Laterality: N/A;  .  ESOPHAGOGASTRODUODENOSCOPY (EGD) WITH PROPOFOL N/A 03/28/2017   Procedure: ESOPHAGOGASTRODUODENOSCOPY (EGD) WITH PROPOFOL;  Surgeon: Lollie Sails, MD;  Location: [facility] ENDOSCOPY;  Service: Endoscopy;  Laterality: N/A;  . EYE SURGERY    . HYSTERECTOMY ABDOMINAL WITH SALPINGECTOMY    . PERIPHERAL VASCULAR CATHETERIZATION N/A 01/23/2016   Procedure: Visceral Venography;  Surgeon: Algernon Huxley, MD;  Location: [place] CV LAB;  Service: Cardiovascular;  Laterality: N/A;  . PERIPHERAL VASCULAR CATHETERIZATION  01/23/2016   Procedure: Peripheral Vascular Intervention;  Surgeon: Algernon Huxley, MD;  Location: [place] CV LAB;  Service: Cardiovascular;;  . VISCERAL ANGIOGRAPHY N/A 03/18/2017   Procedure: Visceral Angiography;  Surgeon: Algernon Huxley, MD;  Location: [place] CV LAB;  Service: Cardiovascular;  Laterality: N/A;  . VISCERAL ARTERY INTERVENTION N/A 03/18/2017   Procedure: Visceral Artery Intervention;  Surgeon: Algernon Huxley, MD;  Location: [place] CV LAB;  Service: Cardiovascular;  Laterality: N/A;    Prior to Admission medications   Medication Sig Start Date End Date Taking? Authorizing Provider  acetaminophen (TYLENOL) 650 MG CR tablet Take 650 mg by mouth every 8 (eight) hours as needed for pain.    [provider]  allopurinol (ZYLOPRIM) 100 MG tablet Take 100 mg by mouth at bedtime.  02/15/17   [provider]  Bioflavonoid Products (VITAMIN C) CHEW Chew 2 tablets by mouth daily.    [provider]  clopidogrel (PLAVIX) 75 MG tablet take 1 tablet by mouth once daily 01/28/17   Algernon Huxley, MD  furosemide (LASIX) 20 MG tablet Take 20 mg by mouth daily.  01/03/15   [provider]  glipiZIDE (GLUCOTROL) 5 MG tablet Take 5 mg by mouth daily before breakfast.    [provider]  hydrALAZINE (APRESOLINE) 50 MG tablet Take 50 mg by mouth 2 (two) times daily.  10/08/16   [provider]  hydrochlorothiazide (HYDRODIURIL) 25 MG  tablet Take 25 mg by mouth daily. 02/25/17   [provider]  HYDROcodone-acetaminophen (NORCO) 5-325 MG tablet Take 1 tablet by mouth every 6 (six) hours as needed for severe pain. 04/08/17   Darel Hong, MD  Hypromellose (GENTEAL MILD) 0.2 % SOLN Place 1 drop into both eyes 3 (three) times daily.    [provider]  iron polysaccharides (NIFEREX) 150 MG capsule Take 150 mg by mouth at bedtime.  07/16/16 07/16/17  [provider]  lisinopril (PRINIVIL,ZESTRIL) 40 MG tablet Take 40 mg by mouth daily.  03/01/15   [provider]  metoprolol succinate (TOPROL-XL) 50 MG 24 hr tablet Take 75 mg by mouth 2 (two) times daily.  02/24/15   [provider]  pantoprazole (PROTONIX) 40 MG tablet Take 40 mg by mouth 2 (two) times daily. 02/19/17   [provider]  rOPINIRole (REQUIP) 2 MG tablet Take 2 mg by mouth 2 (two) times daily.  02/24/15   [provider]  sucralfate (CARAFATE) 1 g tablet Take 1 g by mouth 3 (three) times daily as needed (for difficulty swallowing).  01/22/17   [provider]  vitamin B-12 (CYANOCOBALAMIN) 1000 MCG tablet Take 1,000 mcg by mouth daily.    [provider]  Allergies Gabapentin; Lipitor [atorvastatin]; Mevacor [lovastatin]; Milk-related compounds; Statins; Sulfur; and Zocor [simvastatin]  No family history on file.  Social History Social History  Substance Use Topics  . Smoking status: Never Smoker  . Smokeless tobacco: Never Used  . Alcohol use No    Review of Systems Constitutional: No fever/chills Eyes: No visual changes. ENT: No sore throat. Cardiovascular: Denies chest pain. Respiratory: Denies shortness of breath. Gastrointestinal: Positive abdominal pain.  Positive nausea, no vomiting.  No diarrhea.  No constipation. Genitourinary: Negative for dysuria. Musculoskeletal: Negative for back pain. Skin: Negative for rash. Neurological: Negative for headaches, focal weakness  or numbness.   ____________________________________________   PHYSICAL EXAM:  VITAL SIGNS: ED Triage Vitals  Enc Vitals Group     BP 04/08/17 1504 (!) 181/57     Pulse Rate 04/08/17 1504 (!) 57     Resp 04/08/17 1504 16     Temp 04/08/17 1504 99 F (37.2 C)     Temp Source 04/08/17 1504 Oral     SpO2 04/08/17 1504 100 %     Weight 04/08/17 1504 119 lb (54 kg)     Height 04/08/17 1504 5\' 3"  (1.6 m)     Head Circumference --      Peak Flow --      Pain Score 04/08/17 1515 8     Pain Loc --      Pain Edu? --      Excl. in [place]? --     Constitutional: Alert and oriented 4 pleasant cooperative speaks in full clear sentences Eyes: PERRL EOMI. Head: Atraumatic. Nose: No congestion/rhinnorhea. Mouth/Throat: No trismus Neck: No stridor.   Cardiovascular: Bradycardic rate, regular rhythm. Grossly normal heart sounds.  Good peripheral circulation. Respiratory: Normal respiratory effort.  No retractions. Lungs CTAB and moving good air Gastrointestinal: Soft abdomen diffusely mild tender without focality no rebound or guarding and no peritonitis Musculoskeletal: No lower extremity edema   Neurologic:  Normal speech and language. No gross focal neurologic deficits are appreciated. Skin:  Skin is warm, dry and intact. No rash noted. Psychiatric: Mood and affect are normal. Speech and behavior are normal.    ____________________________________________   DIFFERENTIAL includes but not limited to  Mesenteric ischemia, volvulus, large bowel obstruction, small bowel obstruction, acute coronary syndrome, AAA, aortic dissection ____________________________________________   LABS (all labs ordered are listed, but only abnormal results are displayed)  Labs Reviewed  COMPREHENSIVE METABOLIC PANEL - Abnormal; Notable for the following:       Result Value   Potassium 3.4 (*)    Glucose, Bld 157 (*)    BUN 23 (*)    GFR calc non Af Amer 60 (*)    All other components within normal  limits  CBC - Abnormal; Notable for the following:    Hemoglobin 10.6 (*)    HCT 32.4 (*)    RDW 19.1 (*)    All other components within normal limits  URINALYSIS, COMPLETE (UACMP) WITH MICROSCOPIC - Abnormal; Notable for the following:    Color, Urine YELLOW (*)    APPearance CLEAR (*)    Protein, ur 30 (*)    Squamous Epithelial / LPF 0-5 (*)    All other components within normal limits  LIPASE, BLOOD  TROPONIN I  LACTIC ACID, PLASMA    Normal lactate not suggestive of bowel ischemia no signs of acute ischemia with negative troponin __________________________________________  EKG  ED ECG REPORT I, Darel Hong, the attending physician, personally viewed and interpreted this  ECG.  Date: 04/08/2017 EKG Time:  Rate: 57 Rhythm: Sinus bradycardia QRS Axis: normal Intervals: normal ST/T Wave abnormalities: normal Narrative Interpretation: Borderline  ____________________________________________  RADIOLOGY  CT scan with angiogram of the abdomen pelvis shows chronic celiac occlusion but no acute disease SMA stent is patent ____________________________________________   PROCEDURES  Procedure(s) performed: no  Procedures  Critical Care performed: no  Observation: no ____________________________________________   INITIAL IMPRESSION / ASSESSMENT AND PLAN / ED COURSE  Pertinent labs & imaging results that were available during my care of the patient were reviewed by me and considered in my medical decision making (see chart for details).  The patient arrives well-appearing although with a concerning story. She is quite hypertensive. Differential includes worsening mesenteric ischemia, AAA, aortic dissection etc. She needs a CT angiogram of her abdomen pelvis today.     Fortunately the patient's CT scan is negative for acute pathology. It confirms that her SMA stent is widely patent. Her lactate is 0.6 which goes against acute bowel ischemia. Likewise her troponin  is negative. I appreciate the referring GI physicians concerned about chest pain, however to me she reports upper abdominal pain and not true chest pain. It is likewise nonexertional. I feel a single troponin is adequate and she is medically stable for outpatient management understands and agrees with the plan. ____________________________________________   FINAL CLINICAL IMPRESSION(S) / ED DIAGNOSES  Final diagnoses:  Generalized abdominal pain  Calculus of gallbladder without cholecystitis without obstruction      NEW MEDICATIONS STARTED DURING THIS VISIT:  Discharge Medication List as of 04/08/2017  8:11 PM    START taking these medications   Details  HYDROcodone-acetaminophen (NORCO) 5-325 MG tablet Take 1 tablet by mouth every 6 (six) hours as needed for severe pain., Starting Mon 04/08/2017, Print         Note:  This document was prepared using Dragon voice recognition software and may include unintentional dictation errors.     Darel Hong, MD 04/09/17 1406

## 2017-04-08 NOTE — ED Triage Notes (Signed)
Pt reports epigastric pain for over one month. Pt states today the pain is worse and is radiating into her neck and back. Denies NVD. Pt in no apparent distress in triage.

## 2017-04-08 NOTE — ED Notes (Signed)
ED Provider at bedside with updte.

## 2017-04-18 ENCOUNTER — Inpatient Hospital Stay: Payer: Medicare Other | Attending: Hematology and Oncology

## 2017-04-18 DIAGNOSIS — I10 Essential (primary) hypertension: Secondary | ICD-10-CM | POA: Insufficient documentation

## 2017-04-18 DIAGNOSIS — I2510 Atherosclerotic heart disease of native coronary artery without angina pectoris: Secondary | ICD-10-CM | POA: Diagnosis not present

## 2017-04-18 DIAGNOSIS — Z7901 Long term (current) use of anticoagulants: Secondary | ICD-10-CM | POA: Insufficient documentation

## 2017-04-18 DIAGNOSIS — F329 Major depressive disorder, single episode, unspecified: Secondary | ICD-10-CM | POA: Insufficient documentation

## 2017-04-18 DIAGNOSIS — D509 Iron deficiency anemia, unspecified: Secondary | ICD-10-CM | POA: Insufficient documentation

## 2017-04-18 DIAGNOSIS — Z7984 Long term (current) use of oral hypoglycemic drugs: Secondary | ICD-10-CM | POA: Insufficient documentation

## 2017-04-18 DIAGNOSIS — K2210 Ulcer of esophagus without bleeding: Secondary | ICD-10-CM | POA: Diagnosis not present

## 2017-04-18 DIAGNOSIS — K224 Dyskinesia of esophagus: Secondary | ICD-10-CM | POA: Diagnosis not present

## 2017-04-18 DIAGNOSIS — Z85828 Personal history of other malignant neoplasm of skin: Secondary | ICD-10-CM | POA: Diagnosis not present

## 2017-04-18 DIAGNOSIS — D508 Other iron deficiency anemias: Secondary | ICD-10-CM

## 2017-04-18 DIAGNOSIS — E119 Type 2 diabetes mellitus without complications: Secondary | ICD-10-CM | POA: Diagnosis not present

## 2017-04-18 DIAGNOSIS — E785 Hyperlipidemia, unspecified: Secondary | ICD-10-CM | POA: Insufficient documentation

## 2017-04-18 DIAGNOSIS — Z87442 Personal history of urinary calculi: Secondary | ICD-10-CM | POA: Diagnosis not present

## 2017-04-18 DIAGNOSIS — Z8711 Personal history of peptic ulcer disease: Secondary | ICD-10-CM | POA: Insufficient documentation

## 2017-04-18 DIAGNOSIS — Z79899 Other long term (current) drug therapy: Secondary | ICD-10-CM | POA: Diagnosis not present

## 2017-04-18 DIAGNOSIS — K5730 Diverticulosis of large intestine without perforation or abscess without bleeding: Secondary | ICD-10-CM | POA: Diagnosis not present

## 2017-04-18 DIAGNOSIS — K222 Esophageal obstruction: Secondary | ICD-10-CM | POA: Insufficient documentation

## 2017-04-18 LAB — CBC WITH DIFFERENTIAL/PLATELET
Basophils Absolute: 0 10*3/uL (ref 0–0.1)
Basophils Relative: 1 %
Eosinophils Absolute: 0.2 10*3/uL (ref 0–0.7)
Eosinophils Relative: 3 %
HCT: 28.2 % — ABNORMAL LOW (ref 35.0–47.0)
Hemoglobin: 9.4 g/dL — ABNORMAL LOW (ref 12.0–16.0)
Lymphocytes Relative: 15 %
Lymphs Abs: 0.9 10*3/uL — ABNORMAL LOW (ref 1.0–3.6)
MCH: 26.8 pg (ref 26.0–34.0)
MCHC: 33.2 g/dL (ref 32.0–36.0)
MCV: 80.6 fL (ref 80.0–100.0)
Monocytes Absolute: 0.3 10*3/uL (ref 0.2–0.9)
Monocytes Relative: 6 %
Neutro Abs: 4.6 10*3/uL (ref 1.4–6.5)
Neutrophils Relative %: 75 %
Platelets: 299 10*3/uL (ref 150–440)
RBC: 3.5 MIL/uL — ABNORMAL LOW (ref 3.80–5.20)
RDW: 19 % — ABNORMAL HIGH (ref 11.5–14.5)
WBC: 6.1 10*3/uL (ref 3.6–11.0)

## 2017-04-18 LAB — IRON AND TIBC
Iron: 18 ug/dL — ABNORMAL LOW (ref 28–170)
Saturation Ratios: 7 % — ABNORMAL LOW (ref 10.4–31.8)
TIBC: 275 ug/dL (ref 250–450)
UIBC: 257 ug/dL

## 2017-04-18 LAB — FERRITIN: Ferritin: 27 ng/mL (ref 11–307)

## 2017-04-19 ENCOUNTER — Encounter: Payer: Self-pay | Admitting: Hematology and Oncology

## 2017-04-19 ENCOUNTER — Other Ambulatory Visit: Payer: Self-pay | Admitting: *Deleted

## 2017-04-19 ENCOUNTER — Inpatient Hospital Stay: Payer: Medicare Other

## 2017-04-19 ENCOUNTER — Inpatient Hospital Stay (HOSPITAL_BASED_OUTPATIENT_CLINIC_OR_DEPARTMENT_OTHER): Payer: Medicare Other | Admitting: Hematology and Oncology

## 2017-04-19 VITALS — BP 159/53 | HR 51 | Temp 96.7°F | Resp 18 | Wt 122.0 lb

## 2017-04-19 DIAGNOSIS — D509 Iron deficiency anemia, unspecified: Secondary | ICD-10-CM | POA: Diagnosis not present

## 2017-04-19 DIAGNOSIS — K222 Esophageal obstruction: Secondary | ICD-10-CM | POA: Diagnosis not present

## 2017-04-19 DIAGNOSIS — Z79899 Other long term (current) drug therapy: Secondary | ICD-10-CM | POA: Diagnosis not present

## 2017-04-19 DIAGNOSIS — K2210 Ulcer of esophagus without bleeding: Secondary | ICD-10-CM | POA: Diagnosis not present

## 2017-04-19 DIAGNOSIS — K224 Dyskinesia of esophagus: Secondary | ICD-10-CM

## 2017-04-19 DIAGNOSIS — K5730 Diverticulosis of large intestine without perforation or abscess without bleeding: Secondary | ICD-10-CM | POA: Diagnosis not present

## 2017-04-19 MED ORDER — SODIUM CHLORIDE 0.9 % IV SOLN
Freq: Once | INTRAVENOUS | Status: AC
  Administered 2017-04-19: 15:00:00 via INTRAVENOUS
  Filled 2017-04-19: qty 1000

## 2017-04-19 MED ORDER — IRON SUCROSE 20 MG/ML IV SOLN
200.0000 mg | Freq: Once | INTRAVENOUS | Status: AC
  Administered 2017-04-19: 200 mg via INTRAVENOUS
  Filled 2017-04-19: qty 10

## 2017-04-19 MED ORDER — SODIUM CHLORIDE 0.9 % IV SOLN: 200.0000 mg | Freq: Once | INTRAVENOUS | Status: DC

## 2017-04-19 NOTE — Progress Notes (Signed)
Patient offers no complaints today. 

## 2017-04-19 NOTE — Progress Notes (Signed)
[facility] day: 04/19/2017   Chief Complaint: [name] Day is a 83 y.o. female with iron deficiency anemia who is seen for 1 month assessment.  HPI:   The patient was last seen in the hematology clinic on 03/21/2017.  At that time, she was fatigued.  She had dysphagia.  She had received Venofer x 3.  Ferritin was 164.  Hematocrit had improved from 25.3 to 28.2.  GI was contacted to expedite endoscopies.  EGD on 03/28/2017 by Dr Gustavo Lah revealed a moderate-sized area of extrinsic compression was found in the mid esophagus from about 25-28 cm, no apparent defect or abnormality in the mucosa or esophageal wall. .There was a 2 cm cratered esophageal ulcer.  There was segmental moderate mucosal changes characterized by granularity at the gastroesophageal junction.  There was localized mild inflammation characterized by congestion (edema), depression and erythema was found on the greater curvature of the gastric body.  Biopsies revealed no H pylori, dysplasia or malignancy. She was prescribed pantoprazole 40mg  po bid and sucralfate 1 gm qid for her ulcers/esophagitis/gastritis.  Chest CT on 04/05/2017 demonstrated no paraesophageal mass. There was esophageal wall thickening. There was also coronary atherosclerosis and cardiomegaly.   She was seen by GI on 04/08/2017.  She was adherent to her pantoprazole and Carafate.  She noted some stable epigastric pain described left sided underneath the rib pain that radiates up to her left shoulder and down her left arm x a week. She was fatigued.  She was sent to the ER.  Her pain was felt to be moderate postprandial epigastric radiating to back.  Tropinin was negative.  CBC on 04/08/2017 revealed a hematocrit 32.4, hemoglobin 10.6 and MCV 81.6.  CBC on 04/18/2017 revealed a hematocrit 28.2, hemoglobin 9.4 and MCV 80.6.  Ferritin was 27. Iron saturation was 7% with a TIBC of 275.  Symptomatically, she is feeling  better. She was told she had esophageal spasms. She was placed on Bentyl. Her energy level is better. She is drinking one Glucerna a day. She denies any bleeding.   Past Medical History:  Diagnosis Date  . Anemia   . Arrhythmia   . Basal cell carcinoma of skin   . Brain tumor ([place])   . Brain tumor ([place])   . Cervical spine disease   . Diabetes mellitus without complication ([place])   . Hyperlipemia   . Hypertension   . Kidney stones   . Leaky heart valve   . Meningioma [facility])     Past Surgical History:  Procedure Laterality Date  . ABDOMINAL HYSTERECTOMY    . APPENDECTOMY    . ESOPHAGOGASTRODUODENOSCOPY (EGD) WITH PROPOFOL N/A 03/14/2015   Procedure: ESOPHAGOGASTRODUODENOSCOPY (EGD) WITH PROPOFOL;  Surgeon: Josefine Class, MD;  Location: [facility] ENDOSCOPY;  Service: Endoscopy;  Laterality: N/A;  . ESOPHAGOGASTRODUODENOSCOPY (EGD) WITH PROPOFOL N/A 03/28/2017   Procedure: ESOPHAGOGASTRODUODENOSCOPY (EGD) WITH PROPOFOL;  Surgeon: Lollie Sails, MD;  Location[facility] ENDOSCOPY;  Service: Endoscopy;  Laterality: N/A;  . EYE SURGERY    . HYSTERECTOMY ABDOMINAL WITH SALPINGECTOMY    . PERIPHERAL VASCULAR CATHETERIZATION N/A 01/23/2016   Procedure: Visceral Venography;  Surgeon: Algernon Huxley, MD;  Location: [place] CV LAB;  Service: Cardiovascular;  Laterality: N/A;  . PERIPHERAL VASCULAR CATHETERIZATION  01/23/2016   Procedure: Peripheral Vascular Intervention;  Surgeon: Algernon Huxley, MD;  Location: [place] CV LAB;  Service: Cardiovascular;;  . VISCERAL ANGIOGRAPHY N/A 03/18/2017   Procedure: Visceral Angiography;  Surgeon: Algernon Huxley, MD;  Location: [place] CV LAB;  Service: Cardiovascular;  Laterality: N/A;  . VISCERAL ARTERY INTERVENTION N/A 03/18/2017   Procedure: Visceral Artery Intervention;  Surgeon: Algernon Huxley, MD;  Location: [place] CV LAB;  Service: Cardiovascular;  Laterality: N/A;    History reviewed. No pertinent family history.  Social History:   reports that she has never smoked. She has never used smokeless tobacco. She reports that she does not drink alcohol or use drugs.  She denies any exposure to radiation or toxins.  She is retired.  She worked at [employer].  She lives in [place].  Patient's contact number is [phone].  Daughter's number is [phone] [name]).  The patient is alone today.  Allergies:  Allergies  Allergen Reactions  . Gabapentin Swelling  . Lipitor [Atorvastatin] Other (See Comments)    Muscle aches  . Mevacor [Lovastatin] Other (See Comments)    Muscle aches  . Milk-Related Compounds     Large quantities cause headaches   . Statins Other (See Comments)    Muscle pain  . Sulfur Diarrhea and Nausea And Vomiting  . Zocor [Simvastatin] Other (See Comments)    Muscle aches    Current Medications: Current Outpatient Prescriptions  Medication Sig Dispense Refill  . acetaminophen (TYLENOL) 650 MG CR tablet Take 650 mg by mouth every 8 (eight) hours as needed for pain.    Marland Kitchen allopurinol (ZYLOPRIM) 100 MG tablet Take 100 mg by mouth at bedtime.   0  . Bioflavonoid Products (VITAMIN C) CHEW Chew 2 tablets by mouth daily.    . clopidogrel (PLAVIX) 75 MG tablet take 1 tablet by mouth once daily 30 tablet 11  . dicyclomine (BENTYL) 10 MG capsule Take 10 mg by mouth 3 (three) times daily.    . furosemide (LASIX) 20 MG tablet Take 20 mg by mouth daily.   0  . glipiZIDE (GLUCOTROL) 5 MG tablet Take 5 mg by mouth daily before breakfast.    . hydrALAZINE (APRESOLINE) 50 MG tablet Take 50 mg by mouth 2 (two) times daily.     . hydrochlorothiazide (HYDRODIURIL) 25 MG tablet Take 25 mg by mouth daily.  0  . HYDROcodone-acetaminophen (NORCO) 5-325 MG tablet Take 1 tablet by mouth every 6 (six) hours as needed for severe pain. 7 tablet 0  . Hypromellose (GENTEAL MILD) 0.2 % SOLN Place 1 drop into both eyes 3 (three) times daily.    . iron polysaccharides (NIFEREX) 150 MG capsule Take 150 mg by mouth at  bedtime.     Marland Kitchen lisinopril (PRINIVIL,ZESTRIL) 40 MG tablet Take 40 mg by mouth daily.   0  . metoprolol succinate (TOPROL-XL) 50 MG 24 hr tablet Take 75 mg by mouth 2 (two) times daily.   0  . pantoprazole (PROTONIX) 40 MG tablet Take 40 mg by mouth 2 (two) times daily.    Marland Kitchen rOPINIRole (REQUIP) 2 MG tablet Take 2 mg by mouth 2 (two) times daily.   0  . sucralfate (CARAFATE) 1 g tablet Take 1 g by mouth 3 (three) times daily as needed (for difficulty swallowing).   0  . vitamin B-12 (CYANOCOBALAMIN) 1000 MCG tablet Take 1,000 mcg by mouth daily.     No current facility-administered medications for this visit.     Review of Systems:  GENERAL: Feels bette.  No fevers, sweats .  Weight gain of 4 pounds. PERFORMANCE STATUS (ECOG):  1 HEENT:  Vision issues, afraid to drive.  No runny nose, sore throat, mouth sores or tenderness. Lungs: No shortness of breath or cough.  No hemoptysis. Cardiac:  No chest pain, palpitations, orthopnea, or PND. GI:  Esophagus spasms, improved (see HPI).  No nausea, vomiting, diarrhea, constipation, or melena. GU:  No urgency, frequency, dysuria, or hematuria. Musculoskeletal:  No back pain.  Arthritis pain.  No muscle tenderness. Extremities:  Gout.  No pain or swelling. Skin:  No rashes or skin changes. Neuro:  No headache, numbness or weakness, balance or coordination issues. Endocrine:  No diabetes.  Thyroid disease on Synthroid.  No hot flashes or night sweats. Psych:  No mood changes, depression or anxiety. Pain:  No focal pain. Review of systems:  All other systems reviewed and found to be negative.  Physical Exam: Blood pressure (!) 159/53, pulse (!) 51, temperature (!) 96.7 F (35.9 C), temperature source Tympanic, resp. rate 18, weight 122 lb (55.3 kg). GENERAL:  Thin elderly woman sitting comfortably a wheelchair in the exam room in no acute distress. MENTAL STATUS:  Alert and oriented to person, place and time. HEAD:  Short hair.  Normocephalic,  atraumatic, face symmetric, no Cushingoid features. EYES:  Glasses.  Pupils equal round and reactive to light and accomodation.  No conjunctivitis or scleral icterus. ENT:  Oropharynx clear without lesion.  Tongue normal. Mucous membranes moist.  RESPIRATORY:  Clear to auscultation without rales, wheezes or rhonchi. CARDIOVASCULAR:  Regular rate and rhythm without murmur, rub or gallop. ABDOMEN:  Soft, non-tender, with active bowel sounds, and no hepatosplenomegaly.  No masses. SKIN:  No rashes, ulcers or lesions. EXTREMITIES: Chronic lower extremity edema.  No skin discoloration or tenderness.  No palpable cords. LYMPH NODES: No palpable cervical, supraclavicular, axillary or inguinal adenopathy  NEUROLOGICAL: Unremarkable. PSYCH:  Appropriate.   Appointment on 04/18/2017  Component Date Value Ref Range Status  . WBC 04/18/2017 6.1  3.6 - 11.0 K/uL Final  . RBC 04/18/2017 3.50* 3.80 - 5.20 MIL/uL Final  . Hemoglobin 04/18/2017 9.4* 12.0 - 16.0 g/dL Final  . HCT 04/18/2017 28.2* 35.0 - 47.0 % Final  . MCV 04/18/2017 80.6  80.0 - 100.0 fL Final  . MCH 04/18/2017 26.8  26.0 - 34.0 pg Final  . MCHC 04/18/2017 33.2  32.0 - 36.0 g/dL Final  . RDW 04/18/2017 19.0* 11.5 - 14.5 % Final  . Platelets 04/18/2017 299  150 - 440 K/uL Final  . Neutrophils Relative % 04/18/2017 75  % Final  . Neutro Abs 04/18/2017 4.6  1.4 - 6.5 K/uL Final  . Lymphocytes Relative 04/18/2017 15  % Final  . Lymphs Abs 04/18/2017 0.9* 1.0 - 3.6 K/uL Final  . Monocytes Relative 04/18/2017 6  % Final  . Monocytes Absolute 04/18/2017 0.3  0.2 - 0.9 K/uL Final  . Eosinophils Relative 04/18/2017 3  % Final  . Eosinophils Absolute 04/18/2017 0.2  0 - 0.7 K/uL Final  . Basophils Relative 04/18/2017 1  % Final  . Basophils Absolute 04/18/2017 0.0  0 - 0.1 K/uL Final  . Ferritin 04/18/2017 27  11 - 307 ng/mL Final  . Iron 04/18/2017 18* 28 - 170 ug/dL Final  . TIBC 04/18/2017 275  250 - 450 ug/dL Final  . Saturation Ratios  04/18/2017 7* 10.4 - 31.8 % Final  . UIBC 04/18/2017 257  ug/dL Final    Assessment:  KEZIAH AVIS Day is a 83 y.o. female with iron deficiency anemia.  She has been on oral iron x 1 year with continued decline in  her counts.  She has dysphagia and odynophagia.  She is on  PPI and carafate.  Diet is modest.  She denies pica.  Abdomen and pelvic CT on 02/19/2017 revealed no acute abnormality.  She has mild sigmoid diverticulosis.  CBC on 02/26/2017 revealed a hematocrit of 25.3, hemoglobin 8.0, and MCV 83.2.  Hemoccult studies x 1 were positive in 11/19/2016 and negative x 2 in 01/2017.  Ferritin was 5 with an iron saturation of 4%, and a TIBC of 487 on 01/30/2017.  Normal studies included:  creatinine (1.0), calcium, albumen, B12 (647), and TSH.  Folic acid was 84.1 on 08/09/2015.  Urinalysis revealed no blood on 01/23/2017.  She received Venofer weekly x 3 (02/28/2017 - 03/14/2017).   Ferritin has been followed: 8 on 02/28/2017, 164 on 03/19/2017 and 27 on 04/18/2017.  EGD in 2016 revealed gastric ulcers and esophageal stricture which was dilated.  Barium swallow on 11/15/2015 was normal.  She has a history of mesenteric ischemia.  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  She was on Coumadin, and now on Plavix.  Colonoscopy in 2008 revealed diverticulosis.   EGD on 03/28/2017 revealed a moderate-sized area of extrinsic compression was found in the mid esophagus from about 25-28 cm, no apparent defect or abnormality in the mucosa or esophageal wall. There was a 2 cm cratered esophageal ulcer.  There was segmental moderate mucosal changes characterized by granularity at the gastroesophageal junction.  There was localized mild inflammation characterized by congestion (edema), depression and erythema was found on the greater curvature of the gastric body.  Biopsies revealed no H pylori, dysplasia or malignancy.  She is on pantoprazole and Carafate.  She has esophageal spasms improved with  Bentyl.  Symptomatically, she feels better.  Hematocrit is 28.2.  Ferritin is 27.  Plan: 1.  Review labs from 04/18/2017.  Discuss drop in hematocrit and ferritin.  Discuss plan for additional Venofer. 2.  Review interval endoscopy and GI assessment. 3.  Venofer today. 4.  RTC in 1 week for Venofer. 5.  RTC in 1 month for MD assess and labs (CBC with diff, ferritin- day before).   Lequita Asal, MD  04/19/2017, 1:48 PM

## 2017-04-26 ENCOUNTER — Inpatient Hospital Stay: Payer: Medicare Other

## 2017-04-26 ENCOUNTER — Other Ambulatory Visit: Payer: Self-pay | Admitting: Hematology and Oncology

## 2017-04-26 VITALS — BP 157/52 | HR 71 | Temp 96.8°F | Resp 18

## 2017-04-26 DIAGNOSIS — D509 Iron deficiency anemia, unspecified: Secondary | ICD-10-CM | POA: Diagnosis not present

## 2017-04-26 MED ORDER — IRON SUCROSE 20 MG/ML IV SOLN
200.0000 mg | Freq: Once | INTRAVENOUS | Status: AC
  Administered 2017-04-26: 200 mg via INTRAVENOUS
  Filled 2017-04-26: qty 10

## 2017-04-26 MED ORDER — SODIUM CHLORIDE 0.9 % IV SOLN
Freq: Once | INTRAVENOUS | Status: AC
  Administered 2017-04-26: 13:00:00 via INTRAVENOUS
  Filled 2017-04-26: qty 1000

## 2017-04-26 MED ORDER — SODIUM CHLORIDE 0.9 % IV SOLN: 200.0000 mg | Freq: Once | INTRAVENOUS | Status: DC

## 2017-04-30 ENCOUNTER — Encounter (INDEPENDENT_AMBULATORY_CARE_PROVIDER_SITE_OTHER): Payer: Self-pay | Admitting: Vascular Surgery

## 2017-04-30 ENCOUNTER — Ambulatory Visit (INDEPENDENT_AMBULATORY_CARE_PROVIDER_SITE_OTHER): Payer: Medicare Other | Admitting: Vascular Surgery

## 2017-04-30 VITALS — BP 165/57 | HR 64 | Resp 16 | Wt 116.0 lb

## 2017-04-30 DIAGNOSIS — E118 Type 2 diabetes mellitus with unspecified complications: Secondary | ICD-10-CM | POA: Diagnosis not present

## 2017-04-30 DIAGNOSIS — I10 Essential (primary) hypertension: Secondary | ICD-10-CM | POA: Diagnosis not present

## 2017-04-30 DIAGNOSIS — K559 Vascular disorder of intestine, unspecified: Secondary | ICD-10-CM

## 2017-04-30 NOTE — Assessment & Plan Note (Signed)
blood glucose control important in reducing the progression of atherosclerotic disease. Also, involved in wound healing. On appropriate medications.  

## 2017-04-30 NOTE — Assessment & Plan Note (Signed)
blood pressure control important in reducing the progression of atherosclerotic disease. On appropriate oral medications.  

## 2017-04-30 NOTE — Assessment & Plan Note (Signed)
We performed an angiogram were her SMA stent was widely patent. 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 She is doing better after medical treatment and has a repeat endoscopy scheduled. I will plan to recheck a mesenteric duplex in 6 months.

## 2017-04-30 NOTE — Progress Notes (Signed)
MRN : [id number]  [name] Day is a 83 y.o. (Mar 19, 1929) female who presents with chief complaint of  Chief Complaint  Patient presents with  . [facility] 6wk follow up  .  History of Present Illness: Patient returns today in follow up of abdominal pain and SMA and celiac stenosis. After getting treatment for esophageal ulceration, or abdominal pain has improved but not resolved. She is swallowing much better. She is scheduled for repeat endoscopy in about 8 weeks. We performed an angiogram were her SMA stent was widely patent. 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 She is here today to discuss whether or not intervention would be recommended at this time.  Current Outpatient Prescriptions  Medication Sig Dispense Refill  . acetaminophen (TYLENOL) 650 MG CR tablet Take 650 mg by mouth every 8 (eight) hours as needed for pain.    Marland Kitchen allopurinol (ZYLOPRIM) 100 MG tablet Take 100 mg by mouth at bedtime.   0  . Bioflavonoid Products (VITAMIN C) CHEW Chew 2 tablets by mouth daily.    . clopidogrel (PLAVIX) 75 MG tablet take 1 tablet by mouth once daily 30 tablet 11  . dicyclomine (BENTYL) 10 MG capsule Take 10 mg by mouth 3 (three) times daily.    . furosemide (LASIX) 20 MG tablet Take 20 mg by mouth daily.   0  . glipiZIDE (GLUCOTROL) 5 MG tablet Take 5 mg by mouth daily before breakfast.    . hydrALAZINE (APRESOLINE) 50 MG tablet Take 50 mg by mouth 2 (two) times daily.     . hydrochlorothiazide (HYDRODIURIL) 25 MG tablet Take 25 mg by mouth daily.  0  . HYDROcodone-acetaminophen (NORCO) 5-325 MG tablet Take 1 tablet by mouth every 6 (six) hours as needed for severe pain. 7 tablet 0  . Hypromellose (GENTEAL MILD) 0.2 % SOLN Place 1 drop into both eyes 3 (three) times daily.    . iron polysaccharides (NIFEREX) 150 MG capsule Take 150 mg by mouth at bedtime.     Marland Kitchen lisinopril  (PRINIVIL,ZESTRIL) 40 MG tablet Take 40 mg by mouth daily.   0  . metoprolol succinate (TOPROL-XL) 50 MG 24 hr tablet Take 75 mg by mouth 2 (two) times daily.   0  . pantoprazole (PROTONIX) 40 MG tablet Take 40 mg by mouth 2 (two) times daily.    Marland Kitchen rOPINIRole (REQUIP) 2 MG tablet Take 2 mg by mouth 2 (two) times daily.   0  . sucralfate (CARAFATE) 1 g tablet Take 1 g by mouth 3 (three) times daily as needed (for difficulty swallowing).   0  . vitamin B-12 (CYANOCOBALAMIN) 1000 MCG tablet Take 1,000 mcg by mouth daily.     No current facility-administered medications for this visit.     Past Medical History:  Diagnosis Date  . Anemia   . Arrhythmia   . Basal cell carcinoma of skin   . Brain tumor ([place])   . Brain tumor ([place])   . Cervical spine disease   . Diabetes mellitus without complication ([place])   . Hyperlipemia   . Hypertension   . Kidney stones   . Leaky heart valve   . Meningioma [facility])     Past Surgical History:  Procedure Laterality Date  . ABDOMINAL HYSTERECTOMY    . APPENDECTOMY    . ESOPHAGOGASTRODUODENOSCOPY (EGD) WITH PROPOFOL N/A 03/14/2015  Procedure: ESOPHAGOGASTRODUODENOSCOPY (EGD) WITH PROPOFOL;  Surgeon: Josefine Class, MD;  Location: [facility] ENDOSCOPY;  Service: Endoscopy;  Laterality: N/A;  . ESOPHAGOGASTRODUODENOSCOPY (EGD) WITH PROPOFOL N/A 03/28/2017   Procedure: ESOPHAGOGASTRODUODENOSCOPY (EGD) WITH PROPOFOL;  Surgeon: Lollie Sails, MD;  Location: [facility] ENDOSCOPY;  Service: Endoscopy;  Laterality: N/A;  . EYE SURGERY    . HYSTERECTOMY ABDOMINAL WITH SALPINGECTOMY    . PERIPHERAL VASCULAR CATHETERIZATION N/A 01/23/2016   Procedure: Visceral Venography;  Surgeon: Algernon Huxley, MD;  Location: [place] CV LAB;  Service: Cardiovascular;  Laterality: N/A;  . PERIPHERAL VASCULAR CATHETERIZATION  01/23/2016   Procedure: Peripheral Vascular Intervention;  Surgeon: Algernon Huxley, MD;  Location: [place] CV LAB;  Service: Cardiovascular;;  . VISCERAL  ANGIOGRAPHY N/A 03/18/2017   Procedure: Visceral Angiography;  Surgeon: Algernon Huxley, MD;  Location: [place] CV LAB;  Service: Cardiovascular;  Laterality: N/A;  . VISCERAL ARTERY INTERVENTION N/A 03/18/2017   Procedure: Visceral Artery Intervention;  Surgeon: Algernon Huxley, MD;  Location: [place] CV LAB;  Service: Cardiovascular;  Laterality: N/A;    Social History Social History  Substance Use Topics  . Smoking status: Never Smoker  . Smokeless tobacco: Never Used  . Alcohol use No    Family History Family History  Problem Relation Age of Onset  . Stroke Mother   . Hypertension Mother   . Cancer Father   . Heart attack Sister   . Diabetes Sister   . Heart attack Brother     Allergies  Allergen Reactions  . Gabapentin Swelling  . Lipitor [Atorvastatin] Other (See Comments)    Muscle aches  . Mevacor [Lovastatin] Other (See Comments)    Muscle aches  . Milk-Related Compounds     Large quantities cause headaches   . Statins Other (See Comments)    Muscle pain  . Sulfur Diarrhea and Nausea And Vomiting  . Zocor [Simvastatin] Other (See Comments)    Muscle aches     REVIEW OF SYSTEMS (Negative unless checked)  Constitutional: [x]Weight loss  []Fever  []Chills Cardiac: []Chest pain   []Chest pressure   []Palpitations   []Shortness of breath when laying flat   []Shortness of breath at rest   []Shortness of breath with exertion. Vascular:  []Pain in legs with walking   []Pain in legs at rest   []Pain in legs when laying flat   []Claudication   []Pain in feet when walking  []Pain in feet at rest  []Pain in feet when laying flat   []History of DVT   []Phlebitis   []Swelling in legs   []Varicose veins   []Non-healing ulcers Pulmonary:   []Uses home oxygen   []Productive cough   []Hemoptysis   []Wheeze  []COPD   []Asthma Neurologic:  []Dizziness  []Blackouts   []Seizures   []History of stroke   []History of TIA  []Aphasia   []Temporary blindness   []Dysphagia    []Weakness or numbness in arms   []Weakness or numbness in legs Musculoskeletal:  [x]Arthritis   []Joint swelling   []Joint pain   []Low back pain Hematologic:  []Easy bruising  []Easy bleeding   []Hypercoagulable state   []Anemic   Gastrointestinal:  []Blood in stool   []Vomiting blood  [x]Gastroesophageal reflux/heartburn   [x]Abdominal pain Genitourinary:  []Chronic kidney disease   []Difficult urination  []Frequent urination  []Burning with urination   []Hematuria Skin:  []Rashes   []Ulcers   []Wounds Psychological:  []History of anxiety   []  History of major depression.  Physical Examination  BP (!) 165/57   Pulse 64   Resp 16   Wt 116 lb (52.6 kg)   BMI 20.55 kg/m  Gen:  WD/WN, NAD Head: [place]/AT, No temporalis wasting. Ear/Nose/Throat: Hearing grossly intact, nares w/o erythema or drainage, trachea midline Eyes: Conjunctiva clear. Sclera non-icteric Neck: Supple.  No JVD.  Pulmonary:  Good air movement, no use of accessory muscles.  Cardiac: RRR, normal S1, S2 Vascular:  Vessel Right Left  Radial Palpable Palpable                                   Gastrointestinal: soft, non-tender/non-distended. No guarding/reflex.  Musculoskeletal: M/S 5/5 throughout.  No deformity or atrophy.  Neurologic: Sensation grossly intact in extremities.  Symmetrical.  Speech is fluent.  Psychiatric: Judgment intact, Mood & affect appropriate for pt's clinical situation. Dermatologic: No rashes or ulcers noted.  No cellulitis or open wounds.       Labs Recent Results (from the past 2160 hour(s))  CBC with Differential     Status: Abnormal   Collection Time: 02/28/17 12:16 PM  Result Value Ref Range   WBC 5.9 3.6 - 11.0 K/uL   RBC 3.35 (L) 3.80 - 5.20 MIL/uL   Hemoglobin 8.7 (L) 12.0 - 16.0 g/dL   HCT 26.8 (L) 35.0 - 47.0 %   MCV 80.2 80.0 - 100.0 fL   MCH 25.9 (L) 26.0 - 34.0 pg   MCHC 32.3 32.0 - 36.0 g/dL   RDW 14.4 11.5 - 14.5 %   Platelets 373 150 - 440 K/uL   Neutrophils  Relative % 67 %   Neutro Abs 4.0 1.4 - 6.5 K/uL   Lymphocytes Relative 22 %   Lymphs Abs 1.3 1.0 - 3.6 K/uL   Monocytes Relative 7 %   Monocytes Absolute 0.4 0.2 - 0.9 K/uL   Eosinophils Relative 3 %   Eosinophils Absolute 0.2 0 - 0.7 K/uL   Basophils Relative 1 %   Basophils Absolute 0.1 0 - 0.1 K/uL  Hold Tube- Blood Bank     Status: None   Collection Time: 02/28/17 12:16 PM  Result Value Ref Range   Blood Bank Specimen SAMPLE AVAILABLE FOR TESTING    Sample Expiration 03/03/2017   Reticulocytes     Status: Abnormal   Collection Time: 02/28/17 12:16 PM  Result Value Ref Range   Retic Ct Pct 1.3 0.4 - 3.1 %   RBC. 3.44 (L) 3.80 - 5.20 MIL/uL   Retic Count, Absolute 44.7 19.0 - 183.0 K/uL  Ferritin     Status: Abnormal   Collection Time: 02/28/17 12:16 PM  Result Value Ref Range   Ferritin 8 (L) 11 - 307 ng/mL  Folate     Status: None   Collection Time: 02/28/17 12:16 PM  Result Value Ref Range   Folate 54.6 >5.9 ng/mL    Comment: RESULT CONFIRMED BY MANUAL DILUTION JJB  Multiple Myeloma Panel (SPEP&IFE w/QIG)     Status: Abnormal   Collection Time: 02/28/17 12:16 PM  Result Value Ref Range   IgG (Immunoglobin G), Serum 689 (L) 700 - 1,600 mg/dL   IgA 422 64 - 422 mg/dL   IgM, Serum 79 26 - 217 mg/dL   Total Protein ELP 6.3 6.0 - 8.5 g/dL   Albumin SerPl Elph-Mcnc 3.3 2.9 - 4.4 g/dL   Alpha 1 0.2 0.0 - 0.4 g/dL  Alpha2 Glob SerPl Elph-Mcnc 0.9 0.4 - 1.0 g/dL   B-Globulin SerPl Elph-Mcnc 1.2 0.7 - 1.3 g/dL   Gamma Glob SerPl Elph-Mcnc 0.6 0.4 - 1.8 g/dL   M Protein SerPl Elph-Mcnc Not Observed Not Observed g/dL   Globulin, Total 3.0 2.2 - 3.9 g/dL   Albumin/Glob SerPl 1.2 0.7 - 1.7   IFE 1 Comment     Comment: (NOTE) An apparent polyclonal gammopathy: IgA. Kappa and lambda typing appear increased.    Please Note Comment     Comment: (NOTE) Protein electrophoresis scan will follow via computer, mail, or courier delivery. Performed At: [facility] [place], [place] [id number] Lindon Romp MD QJ:[id number]   CBC with Differential/Platelet     Status: Abnormal   Collection Time: 03/07/17 12:53 PM  Result Value Ref Range   WBC 6.1 3.6 - 11.0 K/uL   RBC 3.46 (L) 3.80 - 5.20 MIL/uL   Hemoglobin 9.0 (L) 12.0 - 16.0 g/dL   HCT 28.0 (L) 35.0 - 47.0 %   MCV 80.8 80.0 - 100.0 fL   MCH 26.0 26.0 - 34.0 pg   MCHC 32.2 32.0 - 36.0 g/dL   RDW 15.6 (H) 11.5 - 14.5 %   Platelets 385 150 - 440 K/uL   Neutrophils Relative % 73 %   Neutro Abs 4.4 1.4 - 6.5 K/uL   Lymphocytes Relative 18 %   Lymphs Abs 1.1 1.0 - 3.6 K/uL   Monocytes Relative 5 %   Monocytes Absolute 0.3 0.2 - 0.9 K/uL   Eosinophils Relative 3 %   Eosinophils Absolute 0.2 0 - 0.7 K/uL   Basophils Relative 1 %   Basophils Absolute 0.1 0 - 0.1 K/uL  Basic metabolic panel     Status: Abnormal   Collection Time: 03/18/17  8:41 AM  Result Value Ref Range   Sodium 138 135 - 145 mmol/L   Potassium 3.7 3.5 - 5.1 mmol/L   Chloride 103 101 - 111 mmol/L   CO2 28 22 - 32 mmol/L   Glucose, Bld 154 (H) 65 - 99 mg/dL   BUN 28 (H) 6 - 20 mg/dL   Creatinine, Ser 1.20 (H) 0.44 - 1.00 mg/dL   Calcium 9.1 8.9 - 10.3 mg/dL   GFR calc non Af Amer 40 (L) >60 mL/min   GFR calc Af Amer 46 (L) >60 mL/min    Comment: (NOTE) The eGFR has been calculated using the CKD EPI equation. This calculation has not been validated in all clinical situations. eGFR's persistently <60 mL/min signify possible Chronic Kidney Disease.    Anion gap 7 5 - 15  Glucose, capillary     Status: Abnormal   Collection Time: 03/18/17 10:10 AM  Result Value Ref Range   Glucose-Capillary 139 (H) 65 - 99 mg/dL  CBC with Differential/Platelet     Status: Abnormal   Collection Time: 03/19/17 11:04 AM  Result Value Ref Range   WBC 6.5 3.6 - 11.0 K/uL   RBC 3.64 (L) 3.80 - 5.20 MIL/uL   Hemoglobin 9.7 (L) 12.0 - 16.0 g/dL   HCT 29.5 (L) 35.0 - 47.0 %   MCV 81.0 80.0 - 100.0 fL   MCH 26.7 26.0 - 34.0 pg    MCHC 33.0 32.0 - 36.0 g/dL   RDW 17.3 (H) 11.5 - 14.5 %   Platelets 268 150 - 440 K/uL   Neutrophils Relative % 73 %   Neutro Abs 4.8 1.4 - 6.5 K/uL   Lymphocytes Relative  17 %   Lymphs Abs 1.1 1.0 - 3.6 K/uL   Monocytes Relative 6 %   Monocytes Absolute 0.4 0.2 - 0.9 K/uL   Eosinophils Relative 3 %   Eosinophils Absolute 0.2 0 - 0.7 K/uL   Basophils Relative 1 %   Basophils Absolute 0.1 0 - 0.1 K/uL  Ferritin     Status: None   Collection Time: 03/19/17 11:04 AM  Result Value Ref Range   Ferritin 164 11 - 307 ng/mL  Iron and TIBC     Status: Abnormal   Collection Time: 03/21/17  2:10 PM  Result Value Ref Range   Iron 28 28 - 170 ug/dL   TIBC 324 250 - 450 ug/dL   Saturation Ratios 9 (L) 10.4 - 31.8 %   UIBC 296 ug/dL  Sedimentation rate     Status: Abnormal   Collection Time: 03/21/17  2:10 PM  Result Value Ref Range   Sed Rate 34 (H) 0 - 30 mm/hr  Glucose, capillary     Status: Abnormal   Collection Time: 03/28/17  7:48 AM  Result Value Ref Range   Glucose-Capillary 135 (H) 65 - 99 mg/dL  Surgical pathology     Status: None   Collection Time: 03/28/17  9:27 AM  Result Value Ref Range   SURGICAL PATHOLOGY      Surgical Pathology CASE: [phone] PATIENT: Berneita BOOTH DAY Surgical Pathology Report     SPECIMEN SUBMITTED: A. Stomach, antrum; cbx B. Stomach, body; cbx C. Stomach, mid, atypical mucosa greater curvature; cbx D. GEJ, abnormal mucosa; cbx E. GEJ, tissue opposite abnormal mucosa; cbx F. Esophagus, ulcer edge 38 cm; cbx  CLINICAL HISTORY: None provided  PRE-OPERATIVE DIAGNOSIS: Dysphagia, abnormal Barium swallow  POST-OPERATIVE DIAGNOSIS: Erosive gastritis, abnormal mucosa at GEJ and greater curvature     DIAGNOSIS: A. STOMACH, ANTRUM; COLD BIOPSY: - MILD REACTIVE GASTROPATHY. - NEGATIVE FOR ACTIVE INFLAMMATION, INTESTINAL METAPLASIA, DYSPLASIA, AND MALIGNANCY. - NEGATIVE FOR HELICOBACTER PYLORI IN HEMATOXYLIN AND EOSIN SECTIONS.  B.  STOMACH, BODY; COLD BIOPSY: - PROTON PUMP INHIBITOR EFFECT. - NEGATIVE FOR INTESTINAL METAPLASIA, DYSPLASIA, AND MALIGNANCY. - NEGATIVE FOR H. PYLORI IN HEMATOXYLIN AND EOSIN SECTIONS.  C. STOMACH, MID, ATYPICAL M UCOSA GREATER CURVATURE; COLD BIOPSY: - OXYNTIC MUCOSA WITH MILD CHRONIC INACTIVE GASTRITIS AND FOVEOLAR HYPERPLASIA. - NEGATIVE FOR INTESTINAL METAPLASIA, DYSPLASIA, AND MALIGNANCY. - NEGATIVE FOR H. PYLORI IN HEMATOXYLIN AND EOSIN SECTIONS.  D. GASTROESOPHAGEAL JUNCTION, ABNORMAL MUCOSA; COLD BIOPSY: - MULTIPLE FRAGMENTS OF COLUMNAR-LINED MUCOSA WITH MARKED REACTIVE FOVEOLAR HYPERPLASIA AND CONGESTION. - NEGATIVE FOR ACTIVE INFLAMMATION. - NEGATIVE FOR GOBLET CELLS, DYSPLASIA, AND MALIGNANCY.  E. GASTROESOPHAGEAL JUNCTION, TISSUE OPPOSITE ABNORMAL MUCOSA; COLD BIOPSY: - FRAGMENT OF COLUMNAR-LINED MUCOSA WITH MARKED REACTIVE FOVEOLAR HYPERPLASIA AND CONGESTION. - FRAGMENT OF UNREMARKABLE SQUAMOUS EPITHELIUM. - NEGATIVE FOR ACTIVE INFLAMMATION. - NEGATIVE FOR GOBLET CELLS, DYSPLASIA, AND MALIGNANCY.  F. ESOPHAGUS, ULCER EDGE 38 CM; COLD BIOPSY: - SQUAMOUS-LINED MUCOSA WITH ULCERATION. - NEGATIVE FOR VIRAL CYTOPATHIC EFFECT AND PILL FRAGMENTS. - NEGATIVE FOR DYSPL ASIA AND MALIGNANCY.   GROSS DESCRIPTION: A. Labeled: C BX antrum of stomach Tissue fragment(s): 1 Size: 0.4 cm Description: pink fragment  Entirely submitted in 1 cassette(s).   B. Labeled: BX body of stomach Tissue fragment(s): 2 Size: 0.3 cm Description: pink fragments  Entirely submitted in one cassette(s).   C. Labeled: C BX atypical mucosa greater curvature mid stomach Tissue fragment(s): 1 Size: 0.3 cm Description: pink fragment  Entirely submitted in 1 cassette(s).   D. Labeled: C BX abnormal  mucosa at [facility] Tissue fragment(s): multiple Size: aggregate, 1.0 x 0.2 x 0.1 cm Description: pink to tan fragments  Entirely submitted in 1 cassette(s).   E. Labeled: C BX tissue opposite  abnormal mucosa in GEJ Tissue fragment(s): 2 Size: less than 0.1 and 0.3 cm Description: pink fragments  Entirely submitted in one cassette(s).   F. Labeled: C BX edge of ulcer at 38 cm esophagus check for CMV and herpes Tissue fragment(s): multiple Size: aggr egate, 0.5 x 0.3 x 0.1 cm Description: tan fragments  Entirely submitted in one cassette(s).  Final Diagnosis performed by Bryan Lemma, MD.  Electronically signed 04/01/2017 6:54:32PM    The electronic signature indicates that the named Attending Pathologist has evaluated the specimen  Technical component performed at [facility], [address] [place], [place] [address] Lab: [phone] Dir: Darrick Penna. Evette Doffing, MD  Professional component performed at [facility], [facility], [place], [place], [place] [address] Lab: [phone] Dir: Dellia Nims. Rubinas, MD    Lipase, blood     Status: None   Collection Time: 04/08/17  3:19 PM  Result Value Ref Range   Lipase 34 11 - 51 U/L  Comprehensive metabolic panel     Status: Abnormal   Collection Time: 04/08/17  3:19 PM  Result Value Ref Range   Sodium 139 135 - 145 mmol/L   Potassium 3.4 (L) 3.5 - 5.1 mmol/L   Chloride 103 101 - 111 mmol/L   CO2 28 22 - 32 mmol/L   Glucose, Bld 157 (H) 65 - 99 mg/dL   BUN 23 (H) 6 - 20 mg/dL   Creatinine, Ser 0.86 0.44 - 1.00 mg/dL   Calcium 9.0 8.9 - 10.3 mg/dL   Total Protein 6.5 6.5 - 8.1 g/dL   Albumin 3.5 3.5 - 5.0 g/dL   AST 30 15 - 41 U/L   ALT 20 14 - 54 U/L   Alkaline Phosphatase 58 38 - 126 U/L   Total Bilirubin 0.5 0.3 - 1.2 mg/dL   GFR calc non Af Amer 60 (L) >60 mL/min   GFR calc Af Amer >60 >60 mL/min    Comment: (NOTE) The eGFR has been calculated using the CKD EPI equation. This calculation has not been validated in all clinical situations. eGFR's persistently <60 mL/min signify possible Chronic Kidney Disease.    Anion gap 8 5 - 15  CBC     Status: Abnormal   Collection Time: 04/08/17  3:19  PM  Result Value Ref Range   WBC 6.4 3.6 - 11.0 K/uL   RBC 3.96 3.80 - 5.20 MIL/uL   Hemoglobin 10.6 (L) 12.0 - 16.0 g/dL   HCT 32.4 (L) 35.0 - 47.0 %   MCV 81.6 80.0 - 100.0 fL   MCH 26.7 26.0 - 34.0 pg   MCHC 32.6 32.0 - 36.0 g/dL   RDW 19.1 (H) 11.5 - 14.5 %   Platelets 289 150 - 440 K/uL  Urinalysis, Complete w Microscopic     Status: Abnormal   Collection Time: 04/08/17  3:19 PM  Result Value Ref Range   Color, Urine YELLOW (A) YELLOW   APPearance CLEAR (A) CLEAR   Specific Gravity, Urine 1.011 1.005 - 1.030   pH 6.0 5.0 - 8.0   Glucose, UA NEGATIVE NEGATIVE mg/dL   Hgb urine dipstick NEGATIVE NEGATIVE   Bilirubin Urine NEGATIVE NEGATIVE   Ketones, ur NEGATIVE NEGATIVE mg/dL   Protein, ur 30 (A) NEGATIVE mg/dL   Nitrite NEGATIVE NEGATIVE   Leukocytes, UA  NEGATIVE NEGATIVE   RBC / HPF 0-5 0 - 5 RBC/hpf   WBC, UA 0-5 0 - 5 WBC/hpf   Bacteria, UA NONE SEEN NONE SEEN   Squamous Epithelial / LPF 0-5 (A) NONE SEEN  Troponin I     Status: None   Collection Time: 04/08/17  5:33 PM  Result Value Ref Range   Troponin I <0.03 <0.03 ng/mL  Lactic acid, plasma     Status: None   Collection Time: 04/08/17  5:33 PM  Result Value Ref Range   Lactic Acid, Venous 0.6 0.5 - 1.9 mmol/L  CBC with Differential     Status: Abnormal   Collection Time: 04/18/17  1:05 PM  Result Value Ref Range   WBC 6.1 3.6 - 11.0 K/uL   RBC 3.50 (L) 3.80 - 5.20 MIL/uL   Hemoglobin 9.4 (L) 12.0 - 16.0 g/dL   HCT 28.2 (L) 35.0 - 47.0 %   MCV 80.6 80.0 - 100.0 fL   MCH 26.8 26.0 - 34.0 pg   MCHC 33.2 32.0 - 36.0 g/dL   RDW 19.0 (H) 11.5 - 14.5 %   Platelets 299 150 - 440 K/uL   Neutrophils Relative % 75 %   Neutro Abs 4.6 1.4 - 6.5 K/uL   Lymphocytes Relative 15 %   Lymphs Abs 0.9 (L) 1.0 - 3.6 K/uL   Monocytes Relative 6 %   Monocytes Absolute 0.3 0.2 - 0.9 K/uL   Eosinophils Relative 3 %   Eosinophils Absolute 0.2 0 - 0.7 K/uL   Basophils Relative 1 %   Basophils Absolute 0.0 0 - 0.1 K/uL    Ferritin     Status: None   Collection Time: 04/18/17  1:05 PM  Result Value Ref Range   Ferritin 27 11 - 307 ng/mL  Iron and TIBC     Status: Abnormal   Collection Time: 04/18/17  1:05 PM  Result Value Ref Range   Iron 18 (L) 28 - 170 ug/dL   TIBC 275 250 - 450 ug/dL   Saturation Ratios 7 (L) 10.4 - 31.8 %   UIBC 257 ug/dL    Radiology Dg Chest 2 View  Result Date: 04/08/2017 CLINICAL DATA:  Epigastric pain radiating to the neck and back. EXAM: CHEST  2 VIEW COMPARISON:  Chest CT 3 days prior FINDINGS: Mild cardiomegaly and tortuous atherosclerotic thoracic aorta. No pulmonary edema. No consolidation, pleural fluid or pneumothorax. The bones are under mineralized. Postsurgical change in the lower cervical spine is partially included. IMPRESSION: Mild cardiomegaly with tortuous atherosclerotic thoracic aorta. No acute abnormality. Electronically Signed   By: Jeb Levering M.D.   On: 04/08/2017 18:10   Ct Chest W Contrast  Result Date: 04/05/2017 CLINICAL DATA:  Dysphasia for 1 month. Hypertension. Diabetes. Endoscopy demonstrating extrinsic compression in the mid esophagus. EXAM: CT CHEST WITH CONTRAST TECHNIQUE: Multidetector CT imaging of the chest was performed during intravenous contrast administration. CONTRAST:  85m ISOVUE-300 IOPAMIDOL (ISOVUE-300) INJECTION 61% COMPARISON:  Esophagram of 03/01/2017 FINDINGS: Cardiovascular: Advanced aortic and branch vessel atherosclerosis. Tortuous thoracic aorta. Mild cardiomegaly, without pericardial effusion. Multivessel coronary artery atherosclerosis. Mediastinum/Nodes: No mediastinal or hilar adenopathy. Mild distal esophageal wall thickening. Example image 106/series 2. No. No periesophageal mass. Lungs/Pleura: Trace left pleural fluid. Biapical pleural-parenchymal scarring. Dependent right lower lobe areas of scarring. Upper Abdomen: Normal imaged portions of the liver, spleen, pancreas, adrenal glands,. The proximal stomach is  underdistended. Apparent wall thickening could be secondary. Example image 125/series 2. Bilateral renal scarring. Stent within  the superior mesenteric artery. Musculoskeletal: Osteopenia. Lower cervical spine fixation. Accentuation of expected thoracic kyphosis. IMPRESSION: 1. Distal esophageal wall thickening is mild and likely corresponds to the stricture on prior esophagram. 2. No periesophageal mass to explain extrinsic compression on endoscopy. 3. Gastric underdistention with suggestion of wall thickening. This likely corresponds to gastritis described on endoscopy. 4. Coronary artery atherosclerosis. Aortic Atherosclerosis (ICD10-I70.0). 5. Cardiomegaly. Electronically Signed   By: Abigail Miyamoto M.D.   On: 04/05/2017 12:23   Ct Angio Abd/pel W And/or Wo Contrast  Result Date: 04/08/2017 CLINICAL DATA:  Abdominal angina with known mesenteric disease. Epigastric pain, worse today. Recent mesenteric angiography with reported SMA stent was patient with minimal irregularity and chronically occluded celiac artery. EXAM: CTA ABDOMEN AND PELVIS wITHOUT AND WITH CONTRAST TECHNIQUE: Multidetector CT imaging of the abdomen and pelvis was performed using the standard protocol during bolus administration of intravenous contrast. Multiplanar reconstructed images and MIPs were obtained and reviewed to evaluate the vascular anatomy. CONTRAST:  100 cc Isovue 370 IV COMPARISON:  Abdominal CT 02/19/2017 FINDINGS: VASCULAR Aorta: Diffuse atheromatous plaque. No occlusion, dissection or aneurysm. 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 Distal branches are patent with atheromatous change. No abrupt occlusion. SMA: Stent in the proximal SMA, no evidence of stent occlusion. Flow within the stent is not well visualized by CTA. More distal SMA is patent with atheromatous plaque distally extending into multiple branches. No abrupt occlusion. Renals: Stenosis at the origin  of both renal arteries. There is neck sirs rib renal artery to the lower left kidney. IMA: Plaque at the origin but no occlusion. Atheromatous changes distally. Inflow: Atheromatous plaques without significant stenosis. 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 Proximal Outflow: Bilateral common femoral and visualized portions of the superficial and profunda femoral arteries are patent without evidence of aneurysm, dissection, vasculitis or significant stenosis. Veins: None venous filling defects, veins are grossly patent. Portal and mesenteric veins appear patent. There is contrast refluxing into the hepatic veins and IVC. Review of the MIP images confirms the above findings. NON-VASCULAR Lower chest: Multi chamber cardiomegaly, with dilated right atrium and ventricle, contrast refluxing into the hepatic veins and IVC. Small left pleural effusion. Mild bibasilar atelectasis. Hepatobiliary: No focal hepatic lesion. Small hyperenhancing focus in the inferior left lobe of the liver is grossly unchanged. Calcified gallstones within physiologically distended gallbladder. No pericholecystic inflammation. Pancreas: Parenchymal atrophy. No ductal dilatation or inflammation. Spleen: Normal in size without focal abnormality. Adrenals/Urinary Tract: No adrenal nodule. Nonobstructing stones in the left kidney, 10 mm in the renal pelvis and 10 mm in the lower pole. Fullness of both renal collecting systems, stable from prior exam. No hydronephrosis or perinephric edema. There is homogeneous renal enhancement. Cortical scarring in the upper left kidney. Ureters are decompressed. Urinary bladder is physiologically distended, no wall thickening. Stomach/Bowel: Mild thickening the distal esophagus. Stomach is otherwise decompressed. There is no bowel wall thickening or inflammation. Mild sigmoid colonic diverticulosis without acute inflammation. Appendix not visualized. Lymphatic: No  enlarged abdominopelvic lymph nodes. Reproductive: Status post hysterectomy. No adnexal masses. Other: Minimal free fluid in the pelvis is unchanged from prior exam. No upper abdominal ascites. No free air. Musculoskeletal: Degenerative change in the spine. There are no acute or suspicious osseous abnormalities. IMPRESSION: VASCULAR 1. Diffuse atheromatous plaque of the abdominal aorta and its branches. No evidence of acute abnormality. 2. Stent at SMA origin without occlusion, flow seen distal to the stent.  3. 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 NON-VASCULAR 1. Findings consistent with elevated right heart pressures with contrast refluxing into the hepatic veins and IVC. 2. No bowel wall thickening or evidence of bowel inflammation. 3. Chronic findings include cholelithiasis, nonobstructing left nephrolithiasis, and colonic diverticulosis without inflammation. Electronically Signed   By: Jeb Levering M.D.   On: 04/08/2017 19:44     Assessment/Plan  Mesenteric ischemia (HCC) We performed an angiogram were her SMA stent was widely patent. 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 She is doing better after medical treatment and has a repeat endoscopy scheduled. I will plan to recheck a mesenteric duplex in 6 months.  Hypertension blood pressure control important in reducing the progression of atherosclerotic disease. On appropriate oral medications.   Type 2 diabetes mellitus with complication (HCC) blood glucose control important in reducing the progression of atherosclerotic disease. Also, involved in wound healing. On appropriate medications.     [name], MD  04/30/2017 12:07 PM    This note was created with Dragon medical transcription system.  Any errors from  dictation are purely unintentional

## 2017-05-17 ENCOUNTER — Other Ambulatory Visit: Payer: Self-pay | Admitting: *Deleted

## 2017-05-17 ENCOUNTER — Inpatient Hospital Stay: Payer: Medicare Other | Attending: Hematology and Oncology

## 2017-05-17 DIAGNOSIS — Z7901 Long term (current) use of anticoagulants: Secondary | ICD-10-CM | POA: Insufficient documentation

## 2017-05-17 DIAGNOSIS — Z85828 Personal history of other malignant neoplasm of skin: Secondary | ICD-10-CM | POA: Diagnosis not present

## 2017-05-17 DIAGNOSIS — R300 Dysuria: Secondary | ICD-10-CM | POA: Insufficient documentation

## 2017-05-17 DIAGNOSIS — Z87442 Personal history of urinary calculi: Secondary | ICD-10-CM | POA: Diagnosis not present

## 2017-05-17 DIAGNOSIS — K5730 Diverticulosis of large intestine without perforation or abscess without bleeding: Secondary | ICD-10-CM | POA: Insufficient documentation

## 2017-05-17 DIAGNOSIS — Z8719 Personal history of other diseases of the digestive system: Secondary | ICD-10-CM | POA: Diagnosis not present

## 2017-05-17 DIAGNOSIS — F329 Major depressive disorder, single episode, unspecified: Secondary | ICD-10-CM | POA: Insufficient documentation

## 2017-05-17 DIAGNOSIS — Z7984 Long term (current) use of oral hypoglycemic drugs: Secondary | ICD-10-CM | POA: Insufficient documentation

## 2017-05-17 DIAGNOSIS — E785 Hyperlipidemia, unspecified: Secondary | ICD-10-CM | POA: Diagnosis not present

## 2017-05-17 DIAGNOSIS — D509 Iron deficiency anemia, unspecified: Secondary | ICD-10-CM | POA: Insufficient documentation

## 2017-05-17 DIAGNOSIS — Z8711 Personal history of peptic ulcer disease: Secondary | ICD-10-CM | POA: Insufficient documentation

## 2017-05-17 DIAGNOSIS — E119 Type 2 diabetes mellitus without complications: Secondary | ICD-10-CM | POA: Diagnosis not present

## 2017-05-17 DIAGNOSIS — I10 Essential (primary) hypertension: Secondary | ICD-10-CM | POA: Diagnosis not present

## 2017-05-17 DIAGNOSIS — K2210 Ulcer of esophagus without bleeding: Secondary | ICD-10-CM | POA: Diagnosis not present

## 2017-05-17 DIAGNOSIS — K222 Esophageal obstruction: Secondary | ICD-10-CM | POA: Insufficient documentation

## 2017-05-17 DIAGNOSIS — K224 Dyskinesia of esophagus: Secondary | ICD-10-CM | POA: Insufficient documentation

## 2017-05-17 LAB — CBC WITH DIFFERENTIAL/PLATELET
Basophils Absolute: 0 10*3/uL (ref 0–0.1)
Basophils Relative: 1 %
Eosinophils Absolute: 0.2 10*3/uL (ref 0–0.7)
Eosinophils Relative: 3 %
HCT: 30.6 % — ABNORMAL LOW (ref 35.0–47.0)
Hemoglobin: 10.1 g/dL — ABNORMAL LOW (ref 12.0–16.0)
Lymphocytes Relative: 18 %
Lymphs Abs: 1.2 10*3/uL (ref 1.0–3.6)
MCH: 27.2 pg (ref 26.0–34.0)
MCHC: 33.1 g/dL (ref 32.0–36.0)
MCV: 82 fL (ref 80.0–100.0)
Monocytes Absolute: 0.4 10*3/uL (ref 0.2–0.9)
Monocytes Relative: 6 %
Neutro Abs: 4.9 10*3/uL (ref 1.4–6.5)
Neutrophils Relative %: 72 %
Platelets: 286 10*3/uL (ref 150–440)
RBC: 3.73 MIL/uL — ABNORMAL LOW (ref 3.80–5.20)
RDW: 18.2 % — ABNORMAL HIGH (ref 11.5–14.5)
WBC: 6.8 10*3/uL (ref 3.6–11.0)

## 2017-05-17 LAB — FERRITIN: Ferritin: 82 ng/mL (ref 11–307)

## 2017-05-19 NOTE — Progress Notes (Signed)
[facility] [name]: 05/20/2017   Chief Complaint: [name] is a 83 y.o. female with iron deficiency anemia who is seen for 1 month assessment.  HPI:   The patient was last seen in the hematology clinic on 04/19/2017.  At that time,  she was feeling better.  Hematocrit was 28.2.  Ferritin was 27.  She received weekly Venofer x 2 (04/19/2017 and 04/26/2017).  CBC on 05/17/2017 revealed a hematocrit of 30.6, hemoglobin 10.1, and MCV 82.  Ferritin was 82.  Symptomatically, patient reporting pain in her abdomen. She is reporting lower urinary tract symptoms today; (+) frequency, urgency, and dysuria reported.  She denies fever. Patient continues to follow up with Dr. Gustavo Lah (gastroenterology). She has a colonoscopy + EGD scheduled on 06/20/2017.    Past Medical History:  Diagnosis Date  . Anemia   . Arrhythmia   . Basal cell carcinoma of skin   . Brain tumor ([place])   . Brain tumor ([place])   . Cervical spine disease   . Diabetes mellitus without complication ([place])   . Hyperlipemia   . Hypertension   . Kidney stones   . Leaky heart valve   . Meningioma [facility])     Past Surgical History:  Procedure Laterality Date  . ABDOMINAL HYSTERECTOMY    . APPENDECTOMY    . ESOPHAGOGASTRODUODENOSCOPY (EGD) WITH PROPOFOL N/A 03/14/2015   Procedure: ESOPHAGOGASTRODUODENOSCOPY (EGD) WITH PROPOFOL;  Surgeon: Josefine Class, MD;  Location[facility] ENDOSCOPY;  Service: Endoscopy;  Laterality: N/A;  . ESOPHAGOGASTRODUODENOSCOPY (EGD) WITH PROPOFOL N/A 03/28/2017   Procedure: ESOPHAGOGASTRODUODENOSCOPY (EGD) WITH PROPOFOL;  Surgeon: Lollie Sails, MD;  Location: [facility] ENDOSCOPY;  Service: Endoscopy;  Laterality: N/A;  . EYE SURGERY    . HYSTERECTOMY ABDOMINAL WITH SALPINGECTOMY    . PERIPHERAL VASCULAR CATHETERIZATION N/A 01/23/2016   Procedure: Visceral Venography;  Surgeon: Algernon Huxley, MD;  Location: [place] CV LAB;  Service: Cardiovascular;   Laterality: N/A;  . PERIPHERAL VASCULAR CATHETERIZATION  01/23/2016   Procedure: Peripheral Vascular Intervention;  Surgeon: Algernon Huxley, MD;  Location: [place] [facility] LAB;  Service: Cardiovascular;;  . VISCERAL ANGIOGRAPHY N/A 03/18/2017   Procedure: Visceral Angiography;  Surgeon: Algernon Huxley, MD;  Location: [place] CV LAB;  Service: Cardiovascular;  Laterality: N/A;  . VISCERAL ARTERY INTERVENTION N/A 03/18/2017   Procedure: Visceral Artery Intervention;  Surgeon: Algernon Huxley, MD;  Location: [place] [facility] LAB;  Service: Cardiovascular;  Laterality: N/A;    Family History  Problem Relation Age of Onset  . Stroke Mother   . Hypertension Mother   . Cancer Father   . Heart attack Sister   . Diabetes Sister   . Heart attack Brother     Social History:  reports that she has never smoked. She has never used smokeless tobacco. She reports that she does not drink alcohol or use drugs.  She denies any exposure to radiation or toxins.  She is retired.  She worked at [employer].  She lives in [place].  Patient's contact number is [phone].  Daughter's number is [phone] [name]).  The patient is alone today.  Allergies:  Allergies  Allergen Reactions  . Gabapentin Swelling  . Lipitor [Atorvastatin] Other (See Comments)    Muscle aches  . Mevacor [Lovastatin] Other (See Comments)    Muscle aches  . Milk-Related Compounds     Large quantities cause headaches   . Statins Other (See  Comments)    Muscle pain  . Sulfur Diarrhea and Nausea And Vomiting  . Zocor [Simvastatin] Other (See Comments)    Muscle aches    Current Medications: Current Outpatient Prescriptions  Medication Sig Dispense Refill  . acetaminophen (TYLENOL) 650 MG CR tablet Take 650 mg by mouth every 8 (eight) hours as needed for pain.    Marland Kitchen allopurinol (ZYLOPRIM) 100 MG tablet Take 100 mg by mouth at bedtime.   0  . Bioflavonoid Products (VITAMIN C) CHEW Chew 2 tablets by mouth daily.     . clopidogrel (PLAVIX) 75 MG tablet take 1 tablet by mouth once daily 30 tablet 11  . dicyclomine (BENTYL) 10 MG capsule Take 10 mg by mouth 3 (three) times daily.    . furosemide (LASIX) 20 MG tablet Take 20 mg by mouth daily.   0  . glipiZIDE (GLUCOTROL) 5 MG tablet Take 5 mg by mouth daily before breakfast.    . hydrALAZINE (APRESOLINE) 50 MG tablet Take 50 mg by mouth 2 (two) times daily.     . hydrochlorothiazide (HYDRODIURIL) 25 MG tablet Take 25 mg by mouth daily.  0  . HYDROcodone-acetaminophen (NORCO) 5-325 MG tablet Take 1 tablet by mouth every 6 (six) hours as needed for severe pain. 7 tablet 0  . Hypromellose (GENTEAL MILD) 0.2 % SOLN Place 1 drop into both eyes 3 (three) times daily.    Marland Kitchen lisinopril (PRINIVIL,ZESTRIL) 40 MG tablet Take 40 mg by mouth daily.   0  . metoprolol succinate (TOPROL-XL) 50 MG 24 hr tablet Take 75 mg by mouth 2 (two) times daily.   0  . pantoprazole (PROTONIX) 40 MG tablet Take 40 mg by mouth 2 (two) times daily.    Marland Kitchen rOPINIRole (REQUIP) 2 MG tablet Take 2 mg by mouth 2 (two) times daily.   0  . sucralfate (CARAFATE) 1 g tablet Take 1 g by mouth 3 (three) times daily as needed (for difficulty swallowing).   0  . vitamin B-12 (CYANOCOBALAMIN) 1000 MCG tablet Take 1,000 mcg by mouth daily.     No current facility-administered medications for this visit.     Review of Systems:  GENERAL: Feels better.  No fevers, sweats .  Weight down 6 pounds. PERFORMANCE STATUS (ECOG):  1 HEENT:  Vision issues (chronic).  No runny nose, sore throat, mouth sores or tenderness. Lungs: No shortness of breath or cough.  No hemoptysis. Cardiac:  No chest pain, palpitations, orthopnea, or PND. GI:  Abdominal discomfort.  No nausea, vomiting, diarrhea, constipation, or melena. GU:  Frequency.  Voids small amount.. Musculoskeletal:  No back pain.  Arthritis pain.  No muscle tenderness. Extremities:  No pain or swelling. Skin:  No rashes or skin changes. Neuro:  No headache,  numbness or weakness, balance or coordination issues. Endocrine:  No diabetes.  Thyroid disease on Synthroid.  No hot flashes or night sweats. Psych:  No mood changes, depression or anxiety. Pain:  No focal pain. Review of systems:  All other systems reviewed and found to be negative.  Physical Exam: Blood pressure (!) 173/51, pulse (!) 54, temperature (!) 97 F (36.1 C), temperature source Tympanic, resp. rate 18, weight 116 lb 6.4 oz (52.8 kg). GENERAL:  Thin elderly woman sitting comfortably a wheelchair in the exam room in no acute distress.  She has a cane at her side. MENTAL STATUS:  Alert and oriented to person, place and time. HEAD:  Short hair.  Normocephalic, atraumatic, face symmetric, no Cushingoid features.  EYES:  Glasses.  Pupils equal round and reactive to light and accomodation.  No conjunctivitis or scleral icterus. ENT:  Oropharynx clear without lesion.  Tongue normal. Mucous membranes moist.  RESPIRATORY:  Clear to auscultation without rales, wheezes or rhonchi. CARDIOVASCULAR:  Regular rate and rhythm without murmur, rub or gallop. ABDOMEN:  Soft, non-tender, with active bowel sounds, and no hepatosplenomegaly.  No masses. SKIN:  No rashes, ulcers or lesions. EXTREMITIES: Chronic lower extremity edema.  No skin discoloration or tenderness.  No palpable cords. LYMPH NODES: No palpable cervical, supraclavicular, axillary or inguinal adenopathy  NEUROLOGICAL: Unremarkable. PSYCH:  Appropriate.   No visits with results within 3 [name](s) from this visit.  Latest known visit with results is:  Orders Only on 05/17/2017  Component Date Value Ref Range Status  . WBC 05/17/2017 6.8  3.6 - 11.0 K/uL Final  . RBC 05/17/2017 3.73* 3.80 - 5.20 MIL/uL Final  . Hemoglobin 05/17/2017 10.1* 12.0 - 16.0 g/dL Final  . HCT 05/17/2017 30.6* 35.0 - 47.0 % Final  . MCV 05/17/2017 82.0  80.0 - 100.0 fL Final  . MCH 05/17/2017 27.2  26.0 - 34.0 pg Final  . MCHC 05/17/2017 33.1  32.0 - 36.0  g/dL Final  . RDW 05/17/2017 18.2* 11.5 - 14.5 % Final  . Platelets 05/17/2017 286  150 - 440 K/uL Final  . Neutrophils Relative % 05/17/2017 72  % Final  . Neutro Abs 05/17/2017 4.9  1.4 - 6.5 K/uL Final  . Lymphocytes Relative 05/17/2017 18  % Final  . Lymphs Abs 05/17/2017 1.2  1.0 - 3.6 K/uL Final  . Monocytes Relative 05/17/2017 6  % Final  . Monocytes Absolute 05/17/2017 0.4  0.2 - 0.9 K/uL Final  . Eosinophils Relative 05/17/2017 3  % Final  . Eosinophils Absolute 05/17/2017 0.2  0 - 0.7 K/uL Final  . Basophils Relative 05/17/2017 1  % Final  . Basophils Absolute 05/17/2017 0.0  0 - 0.1 K/uL Final  . Ferritin 05/17/2017 82  11 - 307 ng/mL Final    Assessment:  HIRAL LUKASIEWICZ [name] is a 83 y.o. female with iron deficiency anemia.  She has been on oral iron x 1 year with continued decline in her counts.  She has dysphagia and odynophagia.  She is on  PPI and carafate.  Diet is modest.  She denies pica.  Abdomen and pelvic CT on 02/19/2017 revealed no acute abnormality.  She has mild sigmoid diverticulosis.  CBC on 02/26/2017 revealed a hematocrit of 25.3, hemoglobin 8.0, and MCV 83.2.  Hemoccult studies x 1 were positive in 11/19/2016 and negative x 2 in 01/2017.  Ferritin was 5 with an iron saturation of 4%, and a TIBC of 487 on 01/30/2017.  Normal studies included:  creatinine (1.0), calcium, albumen, B12 (647), and TSH.  Folic acid was 16.1 on 08/09/2015.  Urinalysis revealed no blood on 01/23/2017.  She received Venofer weekly x 3 (02/28/2017 - 03/14/2017) and x 2 (04/19/2017 - 04/26/2017).   Ferritin has been followed: 8 on 02/28/2017, 164 on 03/19/2017, 27 on 04/18/2017, and 82 on 05/17/2017.  EGD in 2016 revealed gastric ulcers and esophageal stricture which was dilated.  Barium swallow on 11/15/2015 was normal.  She has a history of mesenteric ischemia.  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  She was on Coumadin, and now on Plavix.  Colonoscopy in 2008 revealed  diverticulosis.   EGD on 03/28/2017 revealed a moderate-sized area of extrinsic compression was found in the  mid esophagus from about 25-28 cm, no apparent defect or abnormality in the mucosa or esophageal wall. There was a 2 cm cratered esophageal ulcer.  There was segmental moderate mucosal changes characterized by granularity at the gastroesophageal junction.  There was localized mild inflammation characterized by congestion (edema), depression and erythema was found on the greater curvature of the gastric body.  Biopsies revealed no H pylori, dysplasia or malignancy.  She is on pantoprazole and Carafate.  She has esophageal spasms improved with Bentyl.  She is scheduled for repeat endoscopies in 06/2017.  Symptomatically, she feels better.  She has epigastric pain and lower urinary tract symptoms. Hematocrit is 30.6.  Ferritin is 82  Plan: 1.  Review labs from 05/17/2017.  Discuss improvement in hematocrit and ferritin.  Discuss plan for additional Venofer x 1.  Patient in agreement.  2.  UA and urine culture today. 3.  Venofer today or tomorrow. 4.  Follow-up with GI regarding upcoming endoscopies. 5.  RTC in 2 months for MD assessment and labs (CBC with diff, ferritin, iron studies, sed rate- [name] before).   [name] C. Mike Gip, MD  05/20/2017, 10:41 AM

## 2017-05-20 ENCOUNTER — Inpatient Hospital Stay: Payer: Medicare Other

## 2017-05-20 ENCOUNTER — Inpatient Hospital Stay (HOSPITAL_BASED_OUTPATIENT_CLINIC_OR_DEPARTMENT_OTHER): Payer: Medicare Other | Admitting: Hematology and Oncology

## 2017-05-20 VITALS — BP 173/51 | HR 54 | Temp 97.0°F | Resp 18 | Wt 116.4 lb

## 2017-05-20 DIAGNOSIS — R300 Dysuria: Secondary | ICD-10-CM | POA: Diagnosis not present

## 2017-05-20 DIAGNOSIS — D509 Iron deficiency anemia, unspecified: Secondary | ICD-10-CM | POA: Diagnosis not present

## 2017-05-20 LAB — URINALYSIS, COMPLETE (UACMP) WITH MICROSCOPIC
Bacteria, UA: NONE SEEN
Bilirubin Urine: NEGATIVE
Glucose, UA: NEGATIVE mg/dL
Hgb urine dipstick: NEGATIVE
Ketones, ur: NEGATIVE mg/dL
Nitrite: NEGATIVE
Protein, ur: 30 mg/dL — AB
Specific Gravity, Urine: 1.014 (ref 1.005–1.030)
pH: 7 (ref 5.0–8.0)

## 2017-05-20 NOTE — Progress Notes (Signed)
Patient is here for follow up, she has UTI symptoms. She is having dysuria, she is having urgency.

## 2017-05-21 ENCOUNTER — Encounter: Payer: Self-pay | Admitting: *Deleted

## 2017-05-21 ENCOUNTER — Telehealth: Payer: Self-pay | Admitting: *Deleted

## 2017-05-21 ENCOUNTER — Other Ambulatory Visit: Payer: Self-pay

## 2017-05-21 ENCOUNTER — Inpatient Hospital Stay: Payer: Medicare Other

## 2017-05-21 ENCOUNTER — Emergency Department
Admission: EM | Admit: 2017-05-21 | Discharge: 2017-05-21 | Disposition: A | Discharge status: Home / Self Care | Payer: Medicare Other | Attending: Emergency Medicine | Admitting: Emergency Medicine

## 2017-05-21 ENCOUNTER — Other Ambulatory Visit: Payer: Self-pay | Admitting: Hematology and Oncology

## 2017-05-21 VITALS — BP 192/53 | HR 65 | Temp 96.5°F | Resp 18

## 2017-05-21 DIAGNOSIS — I10 Essential (primary) hypertension: Secondary | ICD-10-CM | POA: Insufficient documentation

## 2017-05-21 DIAGNOSIS — N390 Urinary tract infection, site not specified: Secondary | ICD-10-CM | POA: Insufficient documentation

## 2017-05-21 DIAGNOSIS — Z85828 Personal history of other malignant neoplasm of skin: Secondary | ICD-10-CM | POA: Insufficient documentation

## 2017-05-21 DIAGNOSIS — Z79899 Other long term (current) drug therapy: Secondary | ICD-10-CM | POA: Insufficient documentation

## 2017-05-21 DIAGNOSIS — Z7984 Long term (current) use of oral hypoglycemic drugs: Secondary | ICD-10-CM | POA: Diagnosis not present

## 2017-05-21 DIAGNOSIS — E119 Type 2 diabetes mellitus without complications: Secondary | ICD-10-CM | POA: Diagnosis not present

## 2017-05-21 DIAGNOSIS — D509 Iron deficiency anemia, unspecified: Secondary | ICD-10-CM

## 2017-05-21 DIAGNOSIS — Z7901 Long term (current) use of anticoagulants: Secondary | ICD-10-CM | POA: Insufficient documentation

## 2017-05-21 LAB — COMPREHENSIVE METABOLIC PANEL
ALT: 11 U/L — ABNORMAL LOW (ref 14–54)
AST: 16 U/L (ref 15–41)
Albumin: 3.2 g/dL — ABNORMAL LOW (ref 3.5–5.0)
Alkaline Phosphatase: 61 U/L (ref 38–126)
Anion gap: 7 (ref 5–15)
BUN: 26 mg/dL — ABNORMAL HIGH (ref 6–20)
CO2: 27 mmol/L (ref 22–32)
Calcium: 8.8 mg/dL — ABNORMAL LOW (ref 8.9–10.3)
Chloride: 105 mmol/L (ref 101–111)
Creatinine, Ser: 0.78 mg/dL (ref 0.44–1.00)
GFR calc Af Amer: >60 mL/min (ref >60)
GFR calc non Af Amer: >60 mL/min (ref >60)
Glucose, Bld: 215 mg/dL — ABNORMAL HIGH (ref 65–99)
Potassium: 3.5 mmol/L (ref 3.5–5.1)
Sodium: 139 mmol/L (ref 135–145)
Total Bilirubin: 0.5 mg/dL (ref 0.3–1.2)
Total Protein: 6.2 g/dL — ABNORMAL LOW (ref 6.5–8.1)

## 2017-05-21 LAB — CBC
HCT: 31.5 % — ABNORMAL LOW (ref 35.0–47.0)
Hemoglobin: 10.2 g/dL — ABNORMAL LOW (ref 12.0–16.0)
MCH: 26.8 pg (ref 26.0–34.0)
MCHC: 32.4 g/dL (ref 32.0–36.0)
MCV: 82.8 fL (ref 80.0–100.0)
Platelets: 282 10*3/uL (ref 150–440)
RBC: 3.8 MIL/uL (ref 3.80–5.20)
RDW: 18.2 % — ABNORMAL HIGH (ref 11.5–14.5)
WBC: 7.1 10*3/uL (ref 3.6–11.0)

## 2017-05-21 LAB — LIPASE, BLOOD: Lipase: 35 U/L (ref 11–51)

## 2017-05-21 MED ORDER — SODIUM CHLORIDE 0.9 % IV SOLN: 200.0000 mg | Freq: Once | INTRAVENOUS | Status: DC

## 2017-05-21 MED ORDER — SODIUM CHLORIDE 0.9 % IV SOLN
Freq: Once | INTRAVENOUS | Status: AC
  Administered 2017-05-21: 13:00:00 via INTRAVENOUS
  Filled 2017-05-21: qty 1000

## 2017-05-21 MED ORDER — IRON SUCROSE 20 MG/ML IV SOLN
200.0000 mg | Freq: Once | INTRAVENOUS | Status: DC
  Filled 2017-05-21: qty 10

## 2017-05-21 MED ORDER — HYDRALAZINE HCL 50 MG PO TABS
ORAL_TABLET | ORAL | Status: AC
  Administered 2017-05-21: 25 mg via ORAL
  Filled 2017-05-21: qty 1

## 2017-05-21 MED ORDER — HYDRALAZINE HCL 25 MG PO TABS
25.0000 mg | ORAL_TABLET | Freq: Once | ORAL | Status: AC
  Administered 2017-05-21: 25 mg via ORAL
  Filled 2017-05-21: qty 1

## 2017-05-21 MED ORDER — CEPHALEXIN 500 MG PO CAPS
500.0000 mg | ORAL_CAPSULE | Freq: Once | ORAL | Status: AC
  Administered 2017-05-21: 500 mg via ORAL
  Filled 2017-05-21: qty 1

## 2017-05-21 MED ORDER — CEPHALEXIN 500 MG PO CAPS: 500.0000 mg | ORAL_CAPSULE | Freq: Three times a day (TID) | ORAL | 0 refills | Status: AC

## 2017-05-21 NOTE — Discharge Instructions (Signed)
Continue taking her blood pressure medication as prescribed. Chest pain shortness of breath or significant headache return to the emergency department. If he have fever, or pain in your back return to the emergency room. Follow closely with the primary care doctor tomorrow for recheck.

## 2017-05-21 NOTE — Telephone Encounter (Signed)
[facility] and spoke to [place], who transferred me to Dr. Linton Ham nurse.  Patient is in the infusion area but has a UTI and elevated BP.  Dr. Mike Gip wants Dr. Ginette Pitman to see patient today.  Per RN, MD is triple booked and so is the PA.  They are unable to see patient today.  Dr. Mike Gip notified.  She in turn advises patient to go to ED.  Kerri Perches, RN is taking care of patient in infusion.  She was notified by Dr. Mike Gip to send patient to ED.

## 2017-05-21 NOTE — Progress Notes (Signed)
Patient's BP is high, notified Dr. Mike Gip who said patient would not receive Venofer today and recommends patient go to the emergency department for further evaluation of her BP. Dr. Mike Gip will call to let ED know she is on her way.

## 2017-05-21 NOTE — ED Provider Notes (Signed)
[facility] Emergency Department Provider Note  ____________________________________________   I have reviewed the triage vital signs and the nursing notes.   HISTORY  Chief Complaint Hypertension    HPI [name] is a 83 y.o. female who presents today complaining of multiple issues none of which seem to be acute. She went to get a transfusion at the transfusion center and they noticed that her blood pressure was elevated. This was concerning to them so they were going to send the patient to her primary care doctor. However primary care doctor was triple booked, according to notes, so the patient was sent here, to wait in the emergency room. The patient has no chest pain or short of breath no nausea no vomiting, she has chronic epigastric abdominal pain from her known ulcer which is no different from the weights been for the last 2 months. She has no headache, and she has no associated symptoms with this incidentally noted blood pressure. Patient is on multiple different blood pressure medications. She takes them herself. So far she can recall she took them all this morning. No recent change in medications. No melena no bright red blood per rectum, no exertional symptoms and no complaints. In addition, patient does have dysuria, she was diagnosed with UTI yesterday however, according to patient they are electing to wait until culture results come back to treat her but she has ongoing dysuria like to have antibiotics. Results are still pending in the computer.   Past Medical History:  Diagnosis Date  . Anemia   . Arrhythmia   . Basal cell carcinoma of skin   . Brain tumor ([place])   . Brain tumor ([place])   . Cervical spine disease   . Diabetes mellitus without complication ([place])   . Hyperlipemia   . Hypertension   . Kidney stones   . Leaky heart valve   . Meningioma [facility])     Patient Active Problem List   Diagnosis Date Noted  . Hypertension 04/30/2017  .  Mesenteric ischemia ([place]) 03/01/2017  . Type 2 diabetes mellitus with complication ([place]) 40/81/4481  . Iron deficiency anemia 02/28/2017  . PAD (peripheral artery disease) ([place]) 02/05/2017    Past Surgical History:  Procedure Laterality Date  . ABDOMINAL HYSTERECTOMY    . APPENDECTOMY    . ESOPHAGOGASTRODUODENOSCOPY (EGD) WITH PROPOFOL N/A 03/14/2015   Procedure: ESOPHAGOGASTRODUODENOSCOPY (EGD) WITH PROPOFOL;  Surgeon: Josefine Class, MD;  Location: [facility] ENDOSCOPY;  Service: Endoscopy;  Laterality: N/A;  . ESOPHAGOGASTRODUODENOSCOPY (EGD) WITH PROPOFOL N/A 03/28/2017   Procedure: ESOPHAGOGASTRODUODENOSCOPY (EGD) WITH PROPOFOL;  Surgeon: Lollie Sails, MD;  Location: [facility] ENDOSCOPY;  Service: Endoscopy;  Laterality: N/A;  . EYE SURGERY    . HYSTERECTOMY ABDOMINAL WITH SALPINGECTOMY    . PERIPHERAL VASCULAR CATHETERIZATION N/A 01/23/2016   Procedure: Visceral Venography;  Surgeon: Algernon Huxley, MD;  Location: [place] CV LAB;  Service: Cardiovascular;  Laterality: N/A;  . PERIPHERAL VASCULAR CATHETERIZATION  01/23/2016   Procedure: Peripheral Vascular Intervention;  Surgeon: Algernon Huxley, MD;  Location: [place] CV LAB;  Service: Cardiovascular;;  . VISCERAL ANGIOGRAPHY N/A 03/18/2017   Procedure: Visceral Angiography;  Surgeon: Algernon Huxley, MD;  Location: [place] CV LAB;  Service: Cardiovascular;  Laterality: N/A;  . VISCERAL ARTERY INTERVENTION N/A 03/18/2017   Procedure: Visceral Artery Intervention;  Surgeon: Algernon Huxley, MD;  Location: [place] CV LAB;  Service: Cardiovascular;  Laterality: N/A;    Prior to Admission medications  Medication Sig Start Date End Date Taking? Authorizing Provider  acetaminophen (TYLENOL) 650 MG CR tablet Take 650 mg by mouth every 8 (eight) hours as needed for pain.    [provider]  allopurinol (ZYLOPRIM) 100 MG tablet Take 100 mg by mouth at bedtime.  02/15/17   [provider]  Bioflavonoid Products  (VITAMIN C) CHEW Chew 2 tablets by mouth daily.    [provider]  clopidogrel (PLAVIX) 75 MG tablet take 1 tablet by mouth once daily 01/28/17   Algernon Huxley, MD  dicyclomine (BENTYL) 10 MG capsule Take 10 mg by mouth 3 (three) times daily. 04/09/17 04/09/18  [provider]  furosemide (LASIX) 20 MG tablet Take 20 mg by mouth daily.  01/03/15   [provider]  glipiZIDE (GLUCOTROL) 5 MG tablet Take 5 mg by mouth daily before breakfast.    [provider]  hydrALAZINE (APRESOLINE) 50 MG tablet Take 50 mg by mouth 2 (two) times daily.  10/08/16   [provider]  hydrochlorothiazide (HYDRODIURIL) 25 MG tablet Take 25 mg by mouth daily. 02/25/17   [provider]  HYDROcodone-acetaminophen (NORCO) 5-325 MG tablet Take 1 tablet by mouth every 6 (six) hours as needed for severe pain. 04/08/17   Darel Hong, MD  Hypromellose (GENTEAL MILD) 0.2 % SOLN Place 1 drop into both eyes 3 (three) times daily.    [provider]  lisinopril (PRINIVIL,ZESTRIL) 40 MG tablet Take 40 mg by mouth daily.  03/01/15   [provider]  metoprolol succinate (TOPROL-XL) 50 MG 24 hr tablet Take 75 mg by mouth 2 (two) times daily.  02/24/15   [provider]  pantoprazole (PROTONIX) 40 MG tablet Take 40 mg by mouth 2 (two) times daily. 02/19/17   [provider]  rOPINIRole (REQUIP) 2 MG tablet Take 2 mg by mouth 2 (two) times daily.  02/24/15   [provider]  sucralfate (CARAFATE) 1 g tablet Take 1 g by mouth 3 (three) times daily as needed (for difficulty swallowing).  01/22/17   [provider]  vitamin B-12 (CYANOCOBALAMIN) 1000 MCG tablet Take 1,000 mcg by mouth daily.    [provider]    Allergies Gabapentin; Lipitor [atorvastatin]; Mevacor [lovastatin]; Milk-related compounds; Statins; Sulfur; and Zocor [simvastatin]  Family History  Problem Relation Age of Onset  . Stroke Mother   . Hypertension Mother    . Cancer Father   . Heart attack Sister   . Diabetes Sister   . Heart attack Brother     Social History Social History  Substance Use Topics  . Smoking status: Never Smoker  . Smokeless tobacco: Never Used  . Alcohol use No    Review of Systems Constitutional: No fever/chills Eyes: No visual changes. ENT: No sore throat. No stiff neck no neck pain Cardiovascular: Denies chest pain. Respiratory: Denies shortness of breath. Gastrointestinal:   no vomiting.  No diarrhea.  No constipation. Genitourinary: Positive for dysuria. Musculoskeletal: Negative lower extremity swelling Skin: Negative for rash. Neurological: Negative for severe headaches, focal weakness or numbness.   ____________________________________________   PHYSICAL EXAM:  VITAL SIGNS: ED Triage Vitals  Enc Vitals Group     BP 05/21/17 1451 (!) 180/43     Pulse Rate 05/21/17 1451 70     Resp 05/21/17 1451 12     Temp 05/21/17 1451 98.1 F (36.7 C)     Temp Source 05/21/17 1451 Oral     SpO2 05/21/17 1451 100 %  Weight 05/21/17 1457 116 lb (52.6 kg)     Height --      Head Circumference --      Peak Flow --      Pain Score 05/21/17 1455 6     Pain Loc --      Pain Edu? --      Excl. in [place]? --     Constitutional: Alert and oriented. Well appearing and in no acute distress. Eyes: Conjunctivae are normal Head: Atraumatic HEENT: No congestion/rhinnorhea. Mucous membranes are moist.  Oropharynx non-erythematous Neck:   Nontender with no meningismus, no masses, no stridor Cardiovascular: Normal rate, regular rhythm. Grossly normal heart sounds.  Good peripheral circulation. Respiratory: Normal respiratory effort.  No retractions. Lungs CTAB. Abdominal: Soft and nontender. No distention. No guarding no rebound Back:  There is no focal tenderness or step off.  there is no midline tenderness there are no lesions noted. there is no CVA tenderness  Musculoskeletal: No lower extremity tenderness, no  upper extremity tenderness. No joint effusions, no DVT signs strong distal pulses no edema Neurologic:  Normal speech and language. No gross focal neurologic deficits are appreciated.  Skin:  Skin is warm, dry and intact. No rash noted. Psychiatric: Mood and affect are normal. Speech and behavior are somewhat anxious.  ____________________________________________   LABS (all labs ordered are listed, but only abnormal results are displayed)  Labs Reviewed  COMPREHENSIVE METABOLIC PANEL - Abnormal; Notable for the following:       Result Value   Glucose, Bld 215 (*)    BUN 26 (*)    Calcium 8.8 (*)    Total Protein 6.2 (*)    Albumin 3.2 (*)    ALT 11 (*)    All other components within normal limits  CBC - Abnormal; Notable for the following:    Hemoglobin 10.2 (*)    HCT 31.5 (*)    RDW 18.2 (*)    All other components within normal limits  LIPASE, BLOOD   ____________________________________________  EKG  I personally interpreted any EKGs ordered by me or triage Sinus rhythm nonspecific ST changes noted. ST elevation or depression normal axis, no acute ischemia. ____________________________________________  RADIOLOGY  I reviewed any imaging ordered by me or triage that were performed during my shift and, if possible, patient and/or family made aware of any abnormal findings. ____________________________________________   PROCEDURES  Procedure(s) performed: None  Procedures  Critical Care performed: None  ____________________________________________   INITIAL IMPRESSION / ASSESSMENT AND PLAN / ED COURSE  Pertinent labs & imaging results that were available during my care of the patient were reviewed by me and considered in my medical decision making (see chart for details).  Patient here for absolutely asymptomatic hypertension, and possibly she missed her blood pressure medication this point. She is on multiple. We will give her a dose of hydralazine as a  supplement because if she missed that this morning she could be having some degree of rebound. Otherwise, there is no acute issue to be addressed today. She is being worked up as an outpatient for a urinary tract infection and they are waiting for culture results the patient would like to be treated. I don't think this is unreasonable and I will give her Keflex. She is somewhat anxious, and we will try to avoid eating very aggressive and giving her hypertensive medications at age 41 with no symptoms. She has no evidence of CVA ACS PE or dissection.  ----------------------------------------- 5:33 PM on 05/21/2017 -----------------------------------------  Patient remains without symptoms or concerns here blood pressure trending down, we will discharge with close outpatient follow-up   ____________________________________________   FINAL CLINICAL IMPRESSION(S) / ED DIAGNOSES  Final diagnoses:  None      This chart was dictated using voice recognition software.  Despite best efforts to proofread,  errors can occur which can change meaning.      Schuyler Amor, MD 05/21/17 [phone]

## 2017-05-21 NOTE — Progress Notes (Addendum)
14:45 - saline locked patient's IV and patient transported from Infusion in the [place] to the Emergency Department by wheelchair.  Hand-off to ED Charge RN.LJ

## 2017-05-21 NOTE — ED Triage Notes (Signed)
Pt sent to ED from [facility] due to elevated BP. Pt reports she feels "normal" at this time but reports at baseline she has a stomach ulcer that is a 6/10 pain. Pt denies NVD and verbalized this has been a chronic pain. Pt also currently waiting for a urine culture to be prescribed antibiotics for a known UTI. Pt denies headaches.

## 2017-05-21 NOTE — ED Notes (Addendum)
Pt sent from PCP for HTN, pt denies any new pain at this time. Pt was told she had UTI and low iron. Pt was supposed to get iron transfusion. Pt A&Ox4, NAD noted

## 2017-05-22 ENCOUNTER — Encounter: Payer: Self-pay | Admitting: Hematology and Oncology

## 2017-05-28 DIAGNOSIS — K55059 Acute (reversible) ischemia of intestine, part and extent unspecified: Secondary | ICD-10-CM | POA: Insufficient documentation

## 2017-06-07 ENCOUNTER — Other Ambulatory Visit: Payer: Self-pay | Admitting: *Deleted

## 2017-06-07 DIAGNOSIS — D508 Other iron deficiency anemias: Secondary | ICD-10-CM

## 2017-06-10 ENCOUNTER — Inpatient Hospital Stay: Payer: Medicare Other | Attending: Hematology and Oncology

## 2017-06-10 ENCOUNTER — Other Ambulatory Visit: Payer: Self-pay | Admitting: Hematology and Oncology

## 2017-06-10 DIAGNOSIS — D509 Iron deficiency anemia, unspecified: Secondary | ICD-10-CM | POA: Diagnosis not present

## 2017-06-10 DIAGNOSIS — D508 Other iron deficiency anemias: Secondary | ICD-10-CM

## 2017-06-10 LAB — CBC WITH DIFFERENTIAL/PLATELET
Basophils Absolute: 0.1 10*3/uL (ref 0–0.1)
Basophils Relative: 1 %
Eosinophils Absolute: 0.2 10*3/uL (ref 0–0.7)
Eosinophils Relative: 3 %
HCT: 30.1 % — ABNORMAL LOW (ref 35.0–47.0)
Hemoglobin: 10 g/dL — ABNORMAL LOW (ref 12.0–16.0)
Lymphocytes Relative: 17 %
Lymphs Abs: 1.2 10*3/uL (ref 1.0–3.6)
MCH: 27.4 pg (ref 26.0–34.0)
MCHC: 33.3 g/dL (ref 32.0–36.0)
MCV: 82.4 fL (ref 80.0–100.0)
Monocytes Absolute: 0.5 10*3/uL (ref 0.2–0.9)
Monocytes Relative: 7 %
Neutro Abs: 5.2 10*3/uL (ref 1.4–6.5)
Neutrophils Relative %: 72 %
Platelets: 309 10*3/uL (ref 150–440)
RBC: 3.65 MIL/uL — ABNORMAL LOW (ref 3.80–5.20)
RDW: 16.2 % — ABNORMAL HIGH (ref 11.5–14.5)
WBC: 7.2 10*3/uL (ref 3.6–11.0)

## 2017-06-10 LAB — SEDIMENTATION RATE: Sed Rate: 39 mm/hr — ABNORMAL HIGH (ref 0–30)

## 2017-06-10 LAB — IRON AND TIBC
Iron: 27 ug/dL — ABNORMAL LOW (ref 28–170)
Saturation Ratios: 10 % — ABNORMAL LOW (ref 10.4–31.8)
TIBC: 269 ug/dL (ref 250–450)
UIBC: 242 ug/dL

## 2017-06-10 LAB — FERRITIN: Ferritin: 38 ng/mL (ref 11–307)

## 2017-06-11 ENCOUNTER — Other Ambulatory Visit: Payer: Self-pay | Admitting: Urgent Care

## 2017-06-11 ENCOUNTER — Telehealth: Payer: Self-pay | Admitting: *Deleted

## 2017-06-11 DIAGNOSIS — R300 Dysuria: Secondary | ICD-10-CM

## 2017-06-11 NOTE — Telephone Encounter (Signed)
  When she returns let's do a UA and culture.  M

## 2017-06-11 NOTE — Telephone Encounter (Signed)
Called patient to inquire if her recent UTI has resolved.  Patient states she still has some burning.  I asked if she would be in agreement to come for another urine specimen and she states she is scheduled for Friday for an iron infusion.  If she has to give another specimen she would like to do it the same day as her infusion.

## 2017-06-11 NOTE — Telephone Encounter (Signed)
-----   Message from Lequita Asal, MD sent at 06/11/2017  1:53 PM EDT ----- Regarding: UTI  I don't see that a urine culture was performed.  It looks like she was treated for a UTI in the ER with Keflex on 08/21.  Please confirm that symptoms have resolved.  M  ----- Message ----- From: Karen Kitchens, NP Sent: 05/20/2017  12:01 PM To: Lequita Asal, MD    ----- Message ----- From: Interface, Lab In [facility] Sent: 05/20/2017  11:30 AM To: Karen Kitchens, NP

## 2017-06-11 NOTE — Telephone Encounter (Signed)
[facility] for UA and culture entered for 06/14/2017. Just need to make sure that they collect the urine while she is here.

## 2017-06-14 ENCOUNTER — Inpatient Hospital Stay: Payer: Medicare Other

## 2017-06-14 VITALS — BP 177/56 | HR 56 | Temp 98.0°F | Resp 18

## 2017-06-14 DIAGNOSIS — R300 Dysuria: Secondary | ICD-10-CM

## 2017-06-14 DIAGNOSIS — D509 Iron deficiency anemia, unspecified: Secondary | ICD-10-CM

## 2017-06-14 LAB — URINALYSIS, COMPLETE (UACMP) WITH MICROSCOPIC
Bilirubin Urine: NEGATIVE
Glucose, UA: NEGATIVE mg/dL
Hgb urine dipstick: NEGATIVE
Ketones, ur: NEGATIVE mg/dL
Nitrite: NEGATIVE
Protein, ur: 100 mg/dL — AB
Specific Gravity, Urine: 1.018 (ref 1.005–1.030)
Squamous Epithelial / LPF: NONE SEEN
pH: 5 (ref 5.0–8.0)

## 2017-06-14 MED ORDER — IRON SUCROSE 20 MG/ML IV SOLN
200.0000 mg | Freq: Once | INTRAVENOUS | Status: AC
  Administered 2017-06-14: 200 mg via INTRAVENOUS
  Filled 2017-06-14: qty 10

## 2017-06-14 MED ORDER — SODIUM CHLORIDE 0.9 % IV SOLN
Freq: Once | INTRAVENOUS | Status: AC
  Administered 2017-06-14: 15:00:00 via INTRAVENOUS
  Filled 2017-06-14: qty 1000

## 2017-06-16 ENCOUNTER — Telehealth: Payer: Self-pay | Admitting: Urgent Care

## 2017-06-16 ENCOUNTER — Telehealth: Payer: Self-pay | Admitting: Internal Medicine

## 2017-06-16 ENCOUNTER — Other Ambulatory Visit: Payer: Self-pay | Admitting: Urgent Care

## 2017-06-16 LAB — URINE CULTURE: Culture: >=100000 — AB

## 2017-06-16 MED ORDER — PHENAZOPYRIDINE HCL 200 MG PO TABS: 200.0000 mg | ORAL_TABLET | Freq: Three times a day (TID) | ORAL | 0 refills | Status: AC | PRN

## 2017-06-16 MED ORDER — CIPROFLOXACIN HCL 500 MG PO TABS: 500.0000 mg | ORAL_TABLET | Freq: Two times a day (BID) | ORAL | 0 refills | Status: AC

## 2017-06-16 NOTE — Telephone Encounter (Signed)
UA and urine culture results reviewed and noted to be positive for greater than 100,000 CFU/mL of non-ESBL producing  Klebsiella. Sensitivity report reviewed. Call placed patient to make her aware of her positive results. Patient reporting significant lower urinary tract symptoms, including dysuria, gross hematuria, frequency, and urgency. She denies lower back pain and fever. Patient has already been treated with a course of Keflex. We will proceed with treatment using Cipro 500 mg twice a day 7 days in addition to Pyridium 200 mg 3 times a day when necessary 2 days. Patient encouraged to increase fluid intake in efforts to flush her urinary tract. Additionally, patient was advised to return a call to the clinic if her symptoms are not improving, or she does not she's developed a fever. Prescriptions  e-scribed to patient's pharmacy.

## 2017-06-16 NOTE — Telephone Encounter (Signed)
Pt call re: that she is on antibiotcs for UA; and planned colonoscopy this Thursday; it should be okay from from standpoint; but informed that she should be contacting the GI Doc/Dr.Skulskie' office on Monday for clearance.

## 2017-06-19 ENCOUNTER — Encounter: Payer: Self-pay | Admitting: *Deleted

## 2017-06-20 ENCOUNTER — Ambulatory Visit: Payer: Medicare Other | Admitting: Anesthesiology

## 2017-06-20 ENCOUNTER — Encounter
Admission: RE | Disposition: A | Discharge status: Home / Self Care | Payer: Self-pay | Source: Ambulatory Visit | Attending: Gastroenterology

## 2017-06-20 ENCOUNTER — Encounter: Payer: Self-pay | Admitting: *Deleted

## 2017-06-20 ENCOUNTER — Ambulatory Visit
Admission: RE | Admit: 2017-06-20 | Discharge: 2017-06-20 | Disposition: A | Discharge status: Home / Self Care | Payer: Medicare Other | Source: Ambulatory Visit | Attending: Gastroenterology | Admitting: Gastroenterology

## 2017-06-20 DIAGNOSIS — K2210 Ulcer of esophagus without bleeding: Secondary | ICD-10-CM | POA: Diagnosis not present

## 2017-06-20 DIAGNOSIS — R1310 Dysphagia, unspecified: Secondary | ICD-10-CM | POA: Diagnosis not present

## 2017-06-20 DIAGNOSIS — K2970 Gastritis, unspecified, without bleeding: Secondary | ICD-10-CM | POA: Insufficient documentation

## 2017-06-20 DIAGNOSIS — K621 Rectal polyp: Secondary | ICD-10-CM | POA: Insufficient documentation

## 2017-06-20 DIAGNOSIS — K644 Residual hemorrhoidal skin tags: Secondary | ICD-10-CM | POA: Insufficient documentation

## 2017-06-20 DIAGNOSIS — K5730 Diverticulosis of large intestine without perforation or abscess without bleeding: Secondary | ICD-10-CM | POA: Diagnosis not present

## 2017-06-20 DIAGNOSIS — Z7902 Long term (current) use of antithrombotics/antiplatelets: Secondary | ICD-10-CM | POA: Diagnosis not present

## 2017-06-20 DIAGNOSIS — Z79899 Other long term (current) drug therapy: Secondary | ICD-10-CM | POA: Diagnosis not present

## 2017-06-20 DIAGNOSIS — Z882 Allergy status to sulfonamides status: Secondary | ICD-10-CM | POA: Diagnosis not present

## 2017-06-20 DIAGNOSIS — D509 Iron deficiency anemia, unspecified: Secondary | ICD-10-CM | POA: Insufficient documentation

## 2017-06-20 DIAGNOSIS — Z888 Allergy status to other drugs, medicaments and biological substances status: Secondary | ICD-10-CM | POA: Insufficient documentation

## 2017-06-20 DIAGNOSIS — K635 Polyp of colon: Secondary | ICD-10-CM | POA: Insufficient documentation

## 2017-06-20 DIAGNOSIS — Z791 Long term (current) use of non-steroidal anti-inflammatories (NSAID): Secondary | ICD-10-CM | POA: Diagnosis not present

## 2017-06-20 DIAGNOSIS — M109 Gout, unspecified: Secondary | ICD-10-CM | POA: Diagnosis not present

## 2017-06-20 DIAGNOSIS — K921 Melena: Secondary | ICD-10-CM | POA: Diagnosis present

## 2017-06-20 DIAGNOSIS — E1151 Type 2 diabetes mellitus with diabetic peripheral angiopathy without gangrene: Secondary | ICD-10-CM | POA: Insufficient documentation

## 2017-06-20 HISTORY — DX: Ulcer of esophagus without bleeding: K22.10

## 2017-06-20 HISTORY — PX: ESOPHAGOGASTRODUODENOSCOPY (EGD) WITH PROPOFOL: SHX5813

## 2017-06-20 HISTORY — PX: COLONOSCOPY WITH PROPOFOL: SHX5780

## 2017-06-20 LAB — GLUCOSE, CAPILLARY: Glucose-Capillary: 113 mg/dL — ABNORMAL HIGH (ref 65–99)

## 2017-06-20 SURGERY — COLONOSCOPY WITH PROPOFOL
Anesthesia: General

## 2017-06-20 MED ORDER — PROPOFOL 500 MG/50ML IV EMUL
INTRAVENOUS | Status: DC | PRN
  Administered 2017-06-20: 50 ug/kg/min via INTRAVENOUS

## 2017-06-20 MED ORDER — SODIUM CHLORIDE 0.9 % IV SOLN
INTRAVENOUS | Status: DC
  Administered 2017-06-20: 07:00:00 via INTRAVENOUS

## 2017-06-20 MED ORDER — PROPOFOL 500 MG/50ML IV EMUL
INTRAVENOUS | Status: AC
  Filled 2017-06-20: qty 50

## 2017-06-20 MED ORDER — PROPOFOL 10 MG/ML IV BOLUS
INTRAVENOUS | Status: DC | PRN
  Administered 2017-06-20: 30 mg via INTRAVENOUS
  Administered 2017-06-20 (×2): 20 mg via INTRAVENOUS

## 2017-06-20 MED ORDER — LIDOCAINE HCL (CARDIAC) 20 MG/ML IV SOLN
INTRAVENOUS | Status: DC | PRN
  Administered 2017-06-20: 60 mg via INTRAVENOUS

## 2017-06-20 MED ORDER — SODIUM CHLORIDE 0.9 % IV SOLN: INTRAVENOUS | Status: DC

## 2017-06-20 NOTE — Op Note (Signed)
[facility]astro[facility] Patient Name: [name] Day Procedure Date: 06/20/2017 7:48 AM MRN: [id number] Account #: [id number] Date of Birth: 1929/08/27 Admit Type: Outpatient Age: 83 Room: [facility] ENDO ROOM 1 Gender: Female Note Status: Finalized Procedure:            Colonoscopy Indications:          Heme positive stool Providers:            Lollie Sails, MD Referring MD:         [name], MD (Referring MD) Medicines:            Monitored Anesthesia Care Complications:        No immediate complications. Procedure:            Pre-Anesthesia Assessment:                       - ASA Grade Assessment: III - A patient with severe                        systemic disease.                       After obtaining informed consent, the colonoscope was                        passed under direct vision. Throughout the procedure,                        the patient's blood pressure, pulse, and oxygen                        saturations were monitored continuously. The                        Colonoscope was introduced through the anus with the                        intention of advancing to the cecum. The scope was                        advanced to the sigmoid colon before the procedure was                        aborted. Medications were given. The colonoscopy was                        extremely difficult due to significant looping and a                        tortuous colon. The patient tolerated the procedure                        well. The quality of the bowel preparation was good. Findings:      Non-bleeding external hemorrhoids were found during perianal exam. The       hemorrhoids were small.      A few small-mouthed diverticula were found in the sigmoid colon.      The colon is very tortuous, and despite position change, abdominal       support and reintroduction of the scope I was unable to pass beyond  about 35 cm.      A 1 mm polyp was found  in the recto-sigmoid colon. The polyp was       sessile. The polyp was removed with a cold biopsy forceps. Resection and       retrieval were complete. Impression:           - Non-bleeding external hemorrhoids.                       - Diverticulosis in the sigmoid colon.                       - One 1 mm polyp at the recto-sigmoid colon, removed                        with a cold biopsy forceps. Resected and retrieved. Recommendation:       - Await pathology results.                       - Perform an air contrast barium enema at appointment                        to be scheduled. Procedure Code(s):    --- Professional ---                       [phone], 69, Colonoscopy, flexible; with biopsy, single                        or multiple Diagnosis Code(s):    --- Professional ---                       K64.4, Residual hemorrhoidal skin tags                       D12.7, Benign neoplasm of rectosigmoid junction                       R19.5, Other fecal abnormalities                       K57.30, Diverticulosis of large intestine without                        perforation or abscess without bleeding CPT copyright 2016 American Medical Association. All rights reserved. The codes documented in this report are preliminary and upon coder review may  be revised to meet current compliance requirements. Lollie Sails, MD 06/20/2017 9:07:23 AM This report has been signed electronically. Number of Addenda: 0 Note Initiated On: 06/20/2017 7:48 AM Total Procedure Duration: 0 hours 22 minutes 21 seconds       [facility]

## 2017-06-20 NOTE — Anesthesia Preprocedure Evaluation (Signed)
Anesthesia Evaluation  Patient identified by MRN, date of birth, ID band Patient awake    Reviewed: Allergy & Precautions, NPO status , Patient's Chart, lab work & pertinent test results, reviewed documented beta blocker date and time   Airway Mallampati: II  TM Distance: >3 FB     Dental  (+) Chipped   Pulmonary           Cardiovascular hypertension, Pt. on medications and Pt. on home beta blockers + Peripheral Vascular Disease       Neuro/Psych    GI/Hepatic PUD,   Endo/Other  diabetes, Type 2  Renal/GU Renal disease     Musculoskeletal   Abdominal   Peds  Hematology  (+) anemia ,   Anesthesia Other Findings Gout. Meningioma.  Reproductive/Obstetrics                             Anesthesia Physical Anesthesia Plan  ASA: III  Anesthesia Plan: General   Post-op Pain Management:    Induction: Intravenous  PONV Risk Score and Plan:   Airway Management Planned:   Additional Equipment:   Intra-op Plan:   Post-operative Plan:   Informed Consent: I have reviewed the patients History and Physical, chart, labs and discussed the procedure including the risks, benefits and alternatives for the proposed anesthesia with the patient or authorized representative who has indicated his/her understanding and acceptance.     Plan Discussed with: CRNA  Anesthesia Plan Comments:         Anesthesia Quick Evaluation

## 2017-06-20 NOTE — H&P (Signed)
Outpatient short stay form Pre-procedure 06/20/2017 7:49 AM Lollie Sails MD  Primary Physician: Dr [name]  Reason for visit:  EGD and colonoscopy  History of present illness:  Patient is a 83 year old female presenting today as above. She has a personal history of odynophagia and dysphagia as well as more recent finding of anemia and Hemoccult-positive stool. She has been placed on Plavix due to recent placement of mesenteric stents for severe mesenteric stenosis however that was held for the past 5-6 days. She had an EGD on 03/28/2017 with a finding of a severe esophageal ulceration. She continues to have some symptoms of odynophagia although the dysphagia itself seems to be not quite as severe. She is currently being followed and treated with iron infusions for her anemia.  She tolerated her prep. She takes no other blood thinning agents.    Current Facility-Administered Medications:  .  0.9 %  sodium chloride infusion, , Intravenous, Continuous, Lollie Sails, MD, Last Rate: 20 mL/hr at 06/20/17 0724 .  0.9 %  sodium chloride infusion, , Intravenous, Continuous, Lollie Sails, MD  Prescriptions Prior to Admission  Medication Sig Dispense Refill Last Dose  . allopurinol (ZYLOPRIM) 100 MG tablet Take 100 mg by mouth at bedtime.   0 06/19/2017 at Unknown time  . Bioflavonoid Products (VITAMIN C) CHEW Chew 2 tablets by mouth daily.   Past Week at Unknown time  . ciprofloxacin (CIPRO) 500 MG tablet Take 1 tablet (500 mg total) by mouth 2 (two) times daily. 14 tablet 0 06/20/2017 at 0500  . clopidogrel (PLAVIX) 75 MG tablet take 1 tablet by mouth once daily 30 tablet 11 06/13/2017 at Unknown time  . dicyclomine (BENTYL) 10 MG capsule Take 10 mg by mouth 3 (three) times daily.   06/19/2017 at Unknown time  . furosemide (LASIX) 20 MG tablet Take 20 mg by mouth daily.   0 Past Week at Unknown time  . glipiZIDE (GLUCOTROL) 5 MG tablet Take 5 mg by mouth daily before breakfast.    06/19/2017 at Unknown time  . hydrALAZINE (APRESOLINE) 50 MG tablet Take 50 mg by mouth 2 (two) times daily.    06/20/2017 at 0500  . hydrochlorothiazide (HYDRODIURIL) 25 MG tablet Take 25 mg by mouth daily.  0 Past Week at Unknown time  . Hypromellose (GENTEAL MILD) 0.2 % SOLN Place 1 drop into both eyes 3 (three) times daily.   06/19/2017 at Unknown time  . lisinopril (PRINIVIL,ZESTRIL) 40 MG tablet Take 40 mg by mouth daily.   0 06/20/2017 at 0500  . metoprolol succinate (TOPROL-XL) 50 MG 24 hr tablet Take 75 mg by mouth 2 (two) times daily.   0 06/20/2017 at 0500  . pantoprazole (PROTONIX) 40 MG tablet Take 40 mg by mouth 2 (two) times daily.   06/19/2017 at Unknown time  . rOPINIRole (REQUIP) 2 MG tablet Take 2 mg by mouth 2 (two) times daily.   0 Past Week at Unknown time  . sucralfate (CARAFATE) 1 g tablet Take 1 g by mouth 3 (three) times daily as needed (for difficulty swallowing).   0 06/19/2017 at Unknown time  . vitamin B-12 (CYANOCOBALAMIN) 1000 MCG tablet Take 1,000 mcg by mouth daily.   Past Week at Unknown time  . acetaminophen (TYLENOL) 650 MG CR tablet Take 650 mg by mouth every 8 (eight) hours as needed for pain.   Taking  . HYDROcodone-acetaminophen (NORCO) 5-325 MG tablet Take 1 tablet by mouth every 6 (six) hours as needed  for severe pain. 7 tablet 0 Taking     Allergies  Allergen Reactions  . Gabapentin Swelling  . Lipitor [Atorvastatin] Other (See Comments)    Muscle aches  . Mevacor [Lovastatin] Other (See Comments)    Muscle aches  . Milk-Related Compounds     Large quantities cause headaches   . Septra [Sulfamethoxazole-Trimethoprim]   . Statins Other (See Comments)    Muscle pain  . Sulfur Diarrhea and Nausea And Vomiting  . Zocor [Simvastatin] Other (See Comments)    Muscle aches     Past Medical History:  Diagnosis Date  . Anemia   . Arrhythmia   . Basal cell carcinoma of skin   . Brain tumor ([place])   . Brain tumor ([place])   . Cervical spine disease   .  Diabetes mellitus without complication ([place])   . Esophageal ulcer without bleeding   . Hyperlipemia   . Hypertension   . Kidney stones   . Leaky heart valve   . Meningioma ([place])     Review of systems:      Physical Exam    Heart and lungs: Regular rate and rhythm without rub or gallop, lungs are bilaterally clear    HEENT: Normocephalic atraumatic eyes are anicteric    Other:     Pertinant exam for procedure: Soft nontender nondistended bowel sounds positive normoactive.    Planned proceedures: EGD, colonoscopy and indicated procedures. I have discussed the risks benefits and complications of procedures to include not limited to bleeding, infection, perforation and the risk of sedation and the patient wishes to proceed.    Lollie Sails, MD Gastroenterology 06/20/2017  7:49 AM

## 2017-06-20 NOTE — Op Note (Signed)
[facility] Gastroenterology Patient Name: [name] Day Procedure Date: 06/20/2017 7:49 AM MRN: [id number] Account #: [id number] Date of Birth: Mar 06, 1929 Admit Type: Outpatient Age: 83 Room: [facility] ENDO ROOM 1 Gender: Female Note Status: Finalized Procedure:            Upper GI endoscopy Indications:          Iron deficiency anemia, Odynophagia, Heme positive stool Providers:            Lollie Sails, MD Referring MD:         [name], MD (Referring MD) Medicines:            Monitored Anesthesia Care Complications:        No immediate complications. Procedure:            Pre-Anesthesia Assessment:                       - ASA Grade Assessment: III - A patient with severe                        systemic disease.                       After obtaining informed consent, the endoscope was                        passed under direct vision. Throughout the procedure,                        the patient's blood pressure, pulse, and oxygen                        saturations were monitored continuously. The Endoscope                        was introduced through the mouth, and advanced to the                        third part of duodenum. The upper GI endoscopy was                        accomplished without difficulty. The patient tolerated                        the procedure well. Findings:      prominant protrusion/extrinsic compression to the esophagus at the level       of the aorta, thought noted on the opposite side of the aortic pulsation.      One linear esophageal ulcer was found 37 cm from the incisors. The       lesion was about 4-5 mm in length paralell to the lumen in largest       dimension. There are no overhanging edges. Biopsies taken from the       center and edge of the lesion. The lesion appears improved in appearance       from previous. The GE junction shows a edema though no ulceration,       biopsies. Some tortuousity noted in the  distal esophagus, though no       overt stenosis.      Patchy mild inflammation characterized by congestion (edema) and  erythema was found in the gastric antrum. Biopsies were taken with a       cold forceps for histology.      The examined duodenum was normal.      The cardia and gastric fundus were normal on retroflexion. Impression:           - Non-bleeding esophageal ulcer.                       - Gastritis. Biopsied.                       - Normal examined duodenum. Recommendation:       - Continue present medications.                       - Return to my office in 3 weeks. Procedure Code(s):    --- Professional ---                       [phone], Esophagogastroduodenoscopy, flexible, transoral;                        with biopsy, single or multiple CPT copyright 2016 American Medical Association. All rights reserved. The codes documented in this report are preliminary and upon coder review may  be revised to meet current compliance requirements. Lollie Sails, MD 06/20/2017 8:36:02 AM This report has been signed electronically. Number of Addenda: 0 Note Initiated On: 06/20/2017 7:49 AM      [facility]

## 2017-06-20 NOTE — Anesthesia Procedure Notes (Signed)
Date/Time: 06/20/2017 8:12 AM Performed by: Nelda Marseille Pre-anesthesia Checklist: Patient identified, Emergency Drugs available, Suction available, Patient being monitored and Timeout performed Oxygen Delivery Method: Nasal cannula

## 2017-06-20 NOTE — Anesthesia Post-op Follow-up Note (Signed)
Anesthesia QCDR form completed.        

## 2017-06-20 NOTE — Anesthesia Postprocedure Evaluation (Signed)
Anesthesia Post Note  Patient: LAKEVA HOLLON Day  Procedure(s) Performed: Procedure(s) (LRB): COLONOSCOPY WITH PROPOFOL (N/A) ESOPHAGOGASTRODUODENOSCOPY (EGD) WITH PROPOFOL (N/A)  Patient location during evaluation: Endoscopy Anesthesia Type: General Level of consciousness: awake and alert Pain management: pain level controlled Vital Signs Assessment: post-procedure vital signs reviewed and stable Respiratory status: spontaneous breathing, nonlabored ventilation, respiratory function stable and patient connected to nasal cannula oxygen Cardiovascular status: blood pressure returned to baseline and stable Postop Assessment: no apparent nausea or vomiting Anesthetic complications: no     Last Vitals:  Vitals:   06/20/17 0935 06/20/17 0945  BP: (!) 145/57 (!) 183/75  Pulse: 65 68  Resp: 17 20  Temp:    SpO2: 100% 100%    Last Pain:  Vitals:   06/20/17 0905  TempSrc: Tympanic  PainSc:                  Lynda Capistran S

## 2017-06-20 NOTE — Transfer of Care (Signed)
Immediate Anesthesia Transfer of Care Note  Patient: [name] Day  Procedure(s) Performed: Procedure(s): COLONOSCOPY WITH PROPOFOL (N/A) ESOPHAGOGASTRODUODENOSCOPY (EGD) WITH PROPOFOL (N/A)  Patient Location: PACU  Anesthesia Type:General  Level of Consciousness: awake and alert   Airway & Oxygen Therapy: Patient Spontanous Breathing and Patient connected to nasal cannula oxygen  Post-op Assessment: Report given to RN and Post -op Vital signs reviewed and stable  Post vital signs: Reviewed and stable  Last Vitals:  Vitals:   06/20/17 0653  BP: (!) 173/37  Pulse: 66  Resp: 18  Temp: (!) 36.1 C  SpO2: 100%    Last Pain:  Vitals:   06/20/17 0653  TempSrc: Tympanic  PainSc: 0-No pain         Complications: No apparent anesthesia complications

## 2017-06-21 ENCOUNTER — Encounter: Payer: Self-pay | Admitting: Gastroenterology

## 2017-06-21 ENCOUNTER — Inpatient Hospital Stay: Payer: Medicare Other

## 2017-06-21 ENCOUNTER — Other Ambulatory Visit: Payer: Self-pay | Admitting: Hematology and Oncology

## 2017-06-21 VITALS — BP 155/50 | HR 68 | Resp 18

## 2017-06-21 DIAGNOSIS — D509 Iron deficiency anemia, unspecified: Secondary | ICD-10-CM | POA: Diagnosis not present

## 2017-06-21 MED ORDER — IRON SUCROSE 20 MG/ML IV SOLN
200.0000 mg | Freq: Once | INTRAVENOUS | Status: AC
  Administered 2017-06-21: 200 mg via INTRAVENOUS
  Filled 2017-06-21: qty 10

## 2017-06-21 MED ORDER — SODIUM CHLORIDE 0.9 % IV SOLN
Freq: Once | INTRAVENOUS | Status: AC
  Administered 2017-06-21: 14:00:00 via INTRAVENOUS
  Filled 2017-06-21: qty 1000

## 2017-06-24 LAB — SURGICAL PATHOLOGY

## 2017-06-28 ENCOUNTER — Ambulatory Visit: Payer: Self-pay

## 2017-07-05 ENCOUNTER — Ambulatory Visit: Payer: Self-pay

## 2017-07-09 ENCOUNTER — Ambulatory Visit
Admission: RE | Admit: 2017-07-09 | Discharge: 2017-07-09 | Disposition: A | Discharge status: Home / Self Care | Payer: Medicare Other | Source: Ambulatory Visit | Attending: Internal Medicine | Admitting: Internal Medicine

## 2017-07-09 ENCOUNTER — Other Ambulatory Visit: Payer: Self-pay | Admitting: Internal Medicine

## 2017-07-09 DIAGNOSIS — R2243 Localized swelling, mass and lump, lower limb, bilateral: Secondary | ICD-10-CM

## 2017-07-09 DIAGNOSIS — R600 Localized edema: Secondary | ICD-10-CM | POA: Insufficient documentation

## 2017-07-18 ENCOUNTER — Other Ambulatory Visit: Payer: Self-pay

## 2017-07-18 ENCOUNTER — Inpatient Hospital Stay: Payer: Medicare Other | Attending: Hematology and Oncology

## 2017-07-18 DIAGNOSIS — F329 Major depressive disorder, single episode, unspecified: Secondary | ICD-10-CM | POA: Diagnosis not present

## 2017-07-18 DIAGNOSIS — Z79899 Other long term (current) drug therapy: Secondary | ICD-10-CM | POA: Insufficient documentation

## 2017-07-18 DIAGNOSIS — Z7984 Long term (current) use of oral hypoglycemic drugs: Secondary | ICD-10-CM | POA: Insufficient documentation

## 2017-07-18 DIAGNOSIS — D509 Iron deficiency anemia, unspecified: Secondary | ICD-10-CM | POA: Insufficient documentation

## 2017-07-18 DIAGNOSIS — K224 Dyskinesia of esophagus: Secondary | ICD-10-CM | POA: Diagnosis not present

## 2017-07-18 DIAGNOSIS — Z7901 Long term (current) use of anticoagulants: Secondary | ICD-10-CM | POA: Insufficient documentation

## 2017-07-18 DIAGNOSIS — Z8719 Personal history of other diseases of the digestive system: Secondary | ICD-10-CM | POA: Insufficient documentation

## 2017-07-18 DIAGNOSIS — K2210 Ulcer of esophagus without bleeding: Secondary | ICD-10-CM | POA: Diagnosis not present

## 2017-07-18 DIAGNOSIS — Z87442 Personal history of urinary calculi: Secondary | ICD-10-CM | POA: Insufficient documentation

## 2017-07-18 DIAGNOSIS — B961 Klebsiella pneumoniae [K. pneumoniae] as the cause of diseases classified elsewhere: Secondary | ICD-10-CM | POA: Insufficient documentation

## 2017-07-18 DIAGNOSIS — K319 Disease of stomach and duodenum, unspecified: Secondary | ICD-10-CM | POA: Diagnosis not present

## 2017-07-18 DIAGNOSIS — E785 Hyperlipidemia, unspecified: Secondary | ICD-10-CM | POA: Insufficient documentation

## 2017-07-18 DIAGNOSIS — E119 Type 2 diabetes mellitus without complications: Secondary | ICD-10-CM | POA: Insufficient documentation

## 2017-07-18 DIAGNOSIS — R300 Dysuria: Secondary | ICD-10-CM

## 2017-07-18 DIAGNOSIS — K5730 Diverticulosis of large intestine without perforation or abscess without bleeding: Secondary | ICD-10-CM | POA: Insufficient documentation

## 2017-07-18 DIAGNOSIS — N390 Urinary tract infection, site not specified: Secondary | ICD-10-CM | POA: Diagnosis not present

## 2017-07-18 DIAGNOSIS — Z8711 Personal history of peptic ulcer disease: Secondary | ICD-10-CM | POA: Insufficient documentation

## 2017-07-18 DIAGNOSIS — Z85828 Personal history of other malignant neoplasm of skin: Secondary | ICD-10-CM | POA: Diagnosis not present

## 2017-07-18 DIAGNOSIS — I10 Essential (primary) hypertension: Secondary | ICD-10-CM | POA: Diagnosis not present

## 2017-07-18 LAB — IRON AND TIBC
Iron: 33 ug/dL (ref 28–170)
Saturation Ratios: 13 % (ref 10.4–31.8)
TIBC: 263 ug/dL (ref 250–450)
UIBC: 230 ug/dL

## 2017-07-18 LAB — CBC WITH DIFFERENTIAL/PLATELET
Basophils Absolute: 0.1 10*3/uL (ref 0–0.1)
Basophils Relative: 1 %
Eosinophils Absolute: 0.2 10*3/uL (ref 0–0.7)
Eosinophils Relative: 3 %
HCT: 32.8 % — ABNORMAL LOW (ref 35.0–47.0)
Hemoglobin: 10.7 g/dL — ABNORMAL LOW (ref 12.0–16.0)
Lymphocytes Relative: 14 %
Lymphs Abs: 0.9 10*3/uL — ABNORMAL LOW (ref 1.0–3.6)
MCH: 27.9 pg (ref 26.0–34.0)
MCHC: 32.7 g/dL (ref 32.0–36.0)
MCV: 85.2 fL (ref 80.0–100.0)
Monocytes Absolute: 0.4 10*3/uL (ref 0.2–0.9)
Monocytes Relative: 6 %
Neutro Abs: 5 10*3/uL (ref 1.4–6.5)
Neutrophils Relative %: 76 %
Platelets: 337 10*3/uL (ref 150–440)
RBC: 3.85 MIL/uL (ref 3.80–5.20)
RDW: 16.5 % — ABNORMAL HIGH (ref 11.5–14.5)
WBC: 6.5 10*3/uL (ref 3.6–11.0)

## 2017-07-18 LAB — FERRITIN: Ferritin: 100 ng/mL (ref 11–307)

## 2017-07-18 LAB — SEDIMENTATION RATE: Sed Rate: 28 mm/hr (ref 0–30)

## 2017-07-19 ENCOUNTER — Inpatient Hospital Stay (HOSPITAL_BASED_OUTPATIENT_CLINIC_OR_DEPARTMENT_OTHER): Payer: Medicare Other | Admitting: Hematology and Oncology

## 2017-07-19 ENCOUNTER — Encounter: Payer: Self-pay | Admitting: Hematology and Oncology

## 2017-07-19 VITALS — BP 114/49 | HR 53 | Temp 97.5°F | Resp 18 | Ht 63.0 in | Wt 110.0 lb

## 2017-07-19 DIAGNOSIS — D509 Iron deficiency anemia, unspecified: Secondary | ICD-10-CM | POA: Diagnosis not present

## 2017-07-19 DIAGNOSIS — Z79899 Other long term (current) drug therapy: Secondary | ICD-10-CM | POA: Diagnosis not present

## 2017-07-19 NOTE — Progress Notes (Signed)
[facility] [name]: 07/19/2017   Chief Complaint: [name] is a 83 y.o. female with iron deficiency anemia who is seen for 2 month assessment.  HPI:   The patient was last seen in the hematology clinic on 05/20/2017.  At that time, she felt better.  She had epigastric pain and lower urinary tract symptoms. Hematocrit was 30.6.  Ferritin was 82.  She received weekly Venofer x 2 (06/14/2017 and 06/21/2017).    She was treated for a Klebsiella UTI with ciprofloxacin 500 mg BID x 7 days on 06/16/2017.  She underwent EGD and colonoscopy on 06/20/2017 by Dr. Gustavo Lah.  EGD revealed a non-bleeding esophageal ulcer, gastritis, and a normal duodenum.  Pathology revealed mild reactive gastropathy.  Colonoscopy revealed non-bleeding external hemorrhoids, diverticulosis in the sigmoid colon, and one 1 mm polyp at the recto-sigmoid colon.  Pathology revealed a tubular adenoma  Negative for high grade dysplasia or malignancy. Patient's study could not be completed because of a "torsed" colon. She has subsequently been scheduled for a air contrast colonoscopy with barium.   During the interim, patient has been doing "ok". She continues to have "that same ole abdominal pain that she has had for 3 months". Patient recently treated with a course of antibiotics for cellulitis in her bilateral lower extremities. Patient did not take her Carafate for 10 days, which caused her abdominal pain to worsen. Patient noted that she is having trouble eating. She states, "when I eat something cold it is like a knife cutting me". Patient has lost 6 pounds since her last visit. Patient denies any bruising or bleeding. No hematochezia, melena, or vaginal bleeding reported. Patient has chronic arthritic pain in her hands.    Past Medical History:  Diagnosis Date  . Anemia   . Arrhythmia   . Basal cell carcinoma of skin   . Brain tumor ([place])   . Brain tumor ([place])   . Cervical  spine disease   . Diabetes mellitus without complication ([place])   . Esophageal ulcer without bleeding   . Hyperlipemia   . Hypertension   . Kidney stones   . Leaky heart valve   . Meningioma [facility])     Past Surgical History:  Procedure Laterality Date  . ABDOMINAL HYSTERECTOMY    . APPENDECTOMY    . COLONOSCOPY WITH PROPOFOL N/A 06/20/2017   Procedure: COLONOSCOPY WITH PROPOFOL;  Surgeon: Lollie Sails, MD;  Location: [facility] ENDOSCOPY;  Service: Endoscopy;  Laterality: N/A;  . ESOPHAGOGASTRODUODENOSCOPY (EGD) WITH PROPOFOL N/A 03/14/2015   Procedure: ESOPHAGOGASTRODUODENOSCOPY (EGD) WITH PROPOFOL;  Surgeon: Josefine Class, MD;  Location: [facility] ENDOSCOPY;  Service: Endoscopy;  Laterality: N/A;  . ESOPHAGOGASTRODUODENOSCOPY (EGD) WITH PROPOFOL N/A 03/28/2017   Procedure: ESOPHAGOGASTRODUODENOSCOPY (EGD) WITH PROPOFOL;  Surgeon: Lollie Sails, MD;  Location: [facility] ENDOSCOPY;  Service: Endoscopy;  Laterality: N/A;  . ESOPHAGOGASTRODUODENOSCOPY (EGD) WITH PROPOFOL N/A 06/20/2017   Procedure: ESOPHAGOGASTRODUODENOSCOPY (EGD) WITH PROPOFOL;  Surgeon: Lollie Sails, MD;  Location: [facility] ENDOSCOPY;  Service: Endoscopy;  Laterality: N/A;  . EYE SURGERY    . HYSTERECTOMY ABDOMINAL WITH SALPINGECTOMY    . PERIPHERAL VASCULAR CATHETERIZATION N/A 01/23/2016   Procedure: Visceral Venography;  Surgeon: Algernon Huxley, MD;  Location: [place] CV LAB;  Service: Cardiovascular;  Laterality: N/A;  . PERIPHERAL VASCULAR CATHETERIZATION  01/23/2016   Procedure: Peripheral Vascular Intervention;  Surgeon: Algernon Huxley, MD;  Location: [place] CV LAB;  Service: Cardiovascular;;  . VISCERAL  ANGIOGRAPHY N/A 03/18/2017   Procedure: Visceral Angiography;  Surgeon: Algernon Huxley, MD;  Location: [place] CV LAB;  Service: Cardiovascular;  Laterality: N/A;  . VISCERAL ARTERY INTERVENTION N/A 03/18/2017   Procedure: Visceral Artery Intervention;  Surgeon: Algernon Huxley, MD;  Location: [place] CV  LAB;  Service: Cardiovascular;  Laterality: N/A;    Family History  Problem Relation Age of Onset  . Stroke Mother   . Hypertension Mother   . Cancer Father   . Heart attack Sister   . Diabetes Sister   . Heart attack Brother     Social History:  reports that she has never smoked. She has never used smokeless tobacco. She reports that she does not drink alcohol or use drugs.  She denies any exposure to radiation or toxins.  She is retired.  She worked at [employer].  She lives in [place].  Patient's contact number is [phone].  Daughter's number is [phone] [name]).  The patient is accompanied by her daughter today.   Allergies:  Allergies  Allergen Reactions  . Gabapentin Swelling  . Lipitor [Atorvastatin] Other (See Comments)    Muscle aches  . Mevacor [Lovastatin] Other (See Comments)    Muscle aches  . Milk-Related Compounds     Large quantities cause headaches   . Septra [Sulfamethoxazole-Trimethoprim]   . Statins Other (See Comments)    Muscle pain  . Sulfur Diarrhea and Nausea And Vomiting  . Zocor [Simvastatin] Other (See Comments)    Muscle aches    Current Medications: Current Outpatient Prescriptions  Medication Sig Dispense Refill  . acetaminophen (TYLENOL) 650 MG CR tablet Take 650 mg by mouth every 8 (eight) hours as needed for pain.    Marland Kitchen allopurinol (ZYLOPRIM) 100 MG tablet Take 100 mg by mouth at bedtime.   0  . Bioflavonoid Products (VITAMIN C) CHEW Chew 2 tablets by mouth daily.    . clopidogrel (PLAVIX) 75 MG tablet take 1 tablet by mouth once daily 30 tablet 11  . dicyclomine (BENTYL) 10 MG capsule Take 10 mg by mouth 3 (three) times daily.    . furosemide (LASIX) 40 MG tablet Take 40 mg by mouth 2 (two) times daily.   0  . glipiZIDE (GLUCOTROL) 5 MG tablet Take 5 mg by mouth daily before breakfast.    . hydrALAZINE (APRESOLINE) 50 MG tablet Take 50 mg by mouth 2 (two) times daily.     . hydrochlorothiazide (HYDRODIURIL) 25 MG  tablet Take 25 mg by mouth daily.  0  . Hypromellose (GENTEAL MILD) 0.2 % SOLN Place 1 drop into both eyes 3 (three) times daily.    Marland Kitchen lisinopril (PRINIVIL,ZESTRIL) 40 MG tablet Take 40 mg by mouth daily.   0  . metoprolol succinate (TOPROL-XL) 50 MG 24 hr tablet Take 75 mg by mouth 2 (two) times daily.   0  . pantoprazole (PROTONIX) 40 MG tablet Take 40 mg by mouth 2 (two) times daily.    Marland Kitchen rOPINIRole (REQUIP) 2 MG tablet Take 2 mg by mouth 2 (two) times daily.   0  . sucralfate (CARAFATE) 1 g tablet Take 1 g by mouth 3 (three) times daily as needed (for difficulty swallowing).   0  . vitamin B-12 (CYANOCOBALAMIN) 1000 MCG tablet Take 1,000 mcg by mouth daily.    Marland Kitchen HYDROcodone-acetaminophen (NORCO) 5-325 MG tablet Take 1 tablet by mouth every 6 (six) hours as needed for severe pain. (Patient not taking: Reported on 07/19/2017) 7  tablet 0   No current facility-administered medications for this visit.     Review of Systems:  GENERAL: Feels"ok".  No fevers, sweats .  Weight down 6 pounds. PERFORMANCE STATUS (ECOG):  1 HEENT:  Vision issues (chronic).  No runny nose, sore throat, mouth sores or tenderness. Lungs: No shortness of breath or cough.  No hemoptysis. Cardiac:  No chest pain, palpitations, orthopnea, or PND. GI:  Abdominal discomfort (see HPI).  Symptoms worsened and weight loss when stopped Carafate.  No nausea, vomiting, diarrhea, constipation, or melena. GU:  Frequency.  Voids small amount. Musculoskeletal:  No back pain.  Chronic arthritis pain.  No muscle tenderness. Extremities:  No pain or swelling. Skin:  No rashes or skin changes. Neuro:  No headache, numbness or weakness, balance or coordination issues. Endocrine:  No diabetes.  Thyroid disease on Synthroid.  No hot flashes or night sweats. Psych:  No mood changes, depression or anxiety. Pain:  No focal pain. Review of systems:  All other systems reviewed and found to be negative.  Physical Exam: Blood pressure (!)  114/49, pulse (!) 53, temperature (!) 97.5 F (36.4 C), temperature source Tympanic, resp. rate 18, height 5\' 3"  (1.6 m), weight 110 lb (49.9 kg), SpO2 98 %. GENERAL:  Thin elderly woman sitting comfortably a wheelchair in the exam room in no acute distress.  She has a cane at her side. MENTAL STATUS:  Alert and oriented to person, place and time. HEAD:  Pearline Cables hair pulled up.  Normocephalic, atraumatic, face symmetric, no Cushingoid features. EYES:  Glasses.  Pupils equal round and reactive to light and accomodation.  No conjunctivitis or scleral icterus. ENT:  Oropharynx clear without lesion.  Tongue normal. Mucous membranes dry.  RESPIRATORY:  Clear to auscultation without rales, wheezes or rhonchi. CARDIOVASCULAR:  Regular rate and rhythm without murmur, rub or gallop. ABDOMEN:  Soft, non-tender, with active bowel sounds, and no hepatosplenomegaly.  No masses. SKIN:  No rashes, ulcers or lesions. EXTREMITIES: Chronic lower extremity edema (left > right).  No skin discoloration or tenderness.  No palpable cords. LYMPH NODES: No palpable cervical, supraclavicular, axillary or inguinal adenopathy  NEUROLOGICAL: Unremarkable. PSYCH:  Appropriate.   Orders Only on 07/18/2017  Component Date Value Ref Range Status  . WBC 07/18/2017 6.5  3.6 - 11.0 K/uL Final  . RBC 07/18/2017 3.85  3.80 - 5.20 MIL/uL Final  . Hemoglobin 07/18/2017 10.7* 12.0 - 16.0 g/dL Final  . HCT 07/18/2017 32.8* 35.0 - 47.0 % Final  . MCV 07/18/2017 85.2  80.0 - 100.0 fL Final  . MCH 07/18/2017 27.9  26.0 - 34.0 pg Final  . MCHC 07/18/2017 32.7  32.0 - 36.0 g/dL Final  . RDW 07/18/2017 16.5* 11.5 - 14.5 % Final  . Platelets 07/18/2017 337  150 - 440 K/uL Final  . Neutrophils Relative % 07/18/2017 76  % Final  . Neutro Abs 07/18/2017 5.0  1.4 - 6.5 K/uL Final  . Lymphocytes Relative 07/18/2017 14  % Final  . Lymphs Abs 07/18/2017 0.9* 1.0 - 3.6 K/uL Final  . Monocytes Relative 07/18/2017 6  % Final  . Monocytes  Absolute 07/18/2017 0.4  0.2 - 0.9 K/uL Final  . Eosinophils Relative 07/18/2017 3  % Final  . Eosinophils Absolute 07/18/2017 0.2  0 - 0.7 K/uL Final  . Basophils Relative 07/18/2017 1  % Final  . Basophils Absolute 07/18/2017 0.1  0 - 0.1 K/uL Final  . Ferritin 07/18/2017 100  11 - 307 ng/mL Final  .  Iron 07/18/2017 33  28 - 170 ug/dL Final  . TIBC 07/18/2017 263  250 - 450 ug/dL Final  . Saturation Ratios 07/18/2017 13  10.4 - 31.8 % Final  . UIBC 07/18/2017 230  ug/dL Final  . Sed Rate 07/18/2017 28  0 - 30 mm/hr Final    Assessment:  [name] is a 83 y.o. female with iron deficiency anemia.  She has been on oral iron x 1 year with continued decline in her counts.  She has dysphagia and odynophagia.  She is on  PPI and carafate.  Diet is modest.  She denies pica.  Abdomen and pelvic CT on 02/19/2017 revealed no acute abnormality.  She has mild sigmoid diverticulosis.  CBC on 02/26/2017 revealed a hematocrit of 25.3, hemoglobin 8.0, and MCV 83.2.  Hemoccult studies x 1 were positive in 11/19/2016 and negative x 2 in 01/2017.  Ferritin was 5 with an iron saturation of 4%, and a TIBC of 487 on 01/30/2017.  Normal studies included:  creatinine (1.0), calcium, albumen, B12 (647), and TSH.  Folic acid was 96.2 on 08/09/2015.  Urinalysis revealed no blood on 01/23/2017.  She received Venofer weekly x 3 (02/28/2017 - 03/14/2017), x 2 (04/19/2017 - 04/26/2017), and x 2 (06/14/2017 and 06/21/2017).   Ferritin has been followed: 8 on 02/28/2017, 164 on 03/19/2017, 27 on 04/18/2017, 82 on 05/17/2017, 38 on 06/10/2017, and 100 on 07/18/2017.  EGD in 2016 revealed gastric ulcers and esophageal stricture which was dilated.  Barium swallow on 11/15/2015 was normal.  She has a history of mesenteric ischemia.  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  She was on Coumadin, and now on Plavix.  Colonoscopy in 2008 revealed diverticulosis.   EGD on 03/28/2017 revealed a moderate-sized area of  extrinsic compression was found in the mid esophagus from about 25-28 cm, no apparent defect or abnormality in the mucosa or esophageal wall. There was a 2 cm cratered esophageal ulcer.  There was segmental moderate mucosal changes characterized by granularity at the gastroesophageal junction.  There was localized mild inflammation characterized by congestion (edema), depression and erythema was found on the greater curvature of the gastric body.  Biopsies revealed no H pylori, dysplasia or malignancy.  She is on pantoprazole and Carafate.  She has esophageal spasms improved with Bentyl.  EGD on 06/20/2017 revealed a non-bleeding esophageal ulcer, gastritis, and a normal duodenum.  Pathology revealed mild reactive gastropathy.  Colonoscopy on 06/20/2017 revealed non-bleeding external hemorrhoids, diverticulosis in the sigmoid colon, and one 1 mm polyp at the recto-sigmoid colon.  Pathology revealed a tubular adenoma  Negative for high grade dysplasia or malignancy.  Symptomatically, she feels "ok".  Patient developed diffuse abdominal pain after stopping her Carafate x 10 days. She has restarted her medication and the pain has improved. Exam is unremarkable. Hematocrit is 32.8 and hemoglobin 10.7. Platelets 337,000. Ferritin is 100.  Plan: 1.  Review labs from 07/18/2017.  Discuss improvement in hematocrit and ferritin.  Discuss ongoing monitoring and IV iron as needed.  2.  Discuss interval EGD and colonoscopy- non-bleeding esophageal ulcer and gastritis. 3.  RTC in 2 months for labs (CBC and ferritin). 4.  RTC in 4 months for MD assess and labs (CBC with diff, ferritin-[name] before) +/- Venofer.   Honor Loh, NP  07/19/2017, 11:07 AM   I saw and evaluated the patient, participating in the key portions of the service and reviewing pertinent diagnostic studies and records.  I reviewed the  nurse practitioner's note and agree with the findings and the plan.  The assessment and plan were discussed with  the patient.  A few questions were asked by the patient and answered.   Nolon Stalls, MD 07/19/2017,11:07 AM

## 2017-07-19 NOTE — Progress Notes (Signed)
pt here to f/u for h/o IDA.  Pt reports recent doppler for bil. lower extrem edema. edema and leg pain has improved. pt recently placed on keflex and doxycycline to treat cellulititis of lower extremites.  patient c/o currently Out of Norco for pain - Last taken 1 month ago; pt feels like she may still need the Norco due to her pain r/t esophageal ulcers/ gastritis.

## 2017-09-18 ENCOUNTER — Inpatient Hospital Stay: Payer: Medicare Other | Attending: Hematology and Oncology

## 2017-09-18 DIAGNOSIS — Z7901 Long term (current) use of anticoagulants: Secondary | ICD-10-CM | POA: Diagnosis not present

## 2017-09-18 DIAGNOSIS — K5730 Diverticulosis of large intestine without perforation or abscess without bleeding: Secondary | ICD-10-CM | POA: Diagnosis not present

## 2017-09-18 DIAGNOSIS — Z87442 Personal history of urinary calculi: Secondary | ICD-10-CM | POA: Insufficient documentation

## 2017-09-18 DIAGNOSIS — K2210 Ulcer of esophagus without bleeding: Secondary | ICD-10-CM | POA: Diagnosis not present

## 2017-09-18 DIAGNOSIS — Z7984 Long term (current) use of oral hypoglycemic drugs: Secondary | ICD-10-CM | POA: Diagnosis not present

## 2017-09-18 DIAGNOSIS — E785 Hyperlipidemia, unspecified: Secondary | ICD-10-CM | POA: Insufficient documentation

## 2017-09-18 DIAGNOSIS — Z8719 Personal history of other diseases of the digestive system: Secondary | ICD-10-CM | POA: Insufficient documentation

## 2017-09-18 DIAGNOSIS — I10 Essential (primary) hypertension: Secondary | ICD-10-CM | POA: Diagnosis not present

## 2017-09-18 DIAGNOSIS — N390 Urinary tract infection, site not specified: Secondary | ICD-10-CM | POA: Diagnosis not present

## 2017-09-18 DIAGNOSIS — F329 Major depressive disorder, single episode, unspecified: Secondary | ICD-10-CM | POA: Diagnosis not present

## 2017-09-18 DIAGNOSIS — Z85828 Personal history of other malignant neoplasm of skin: Secondary | ICD-10-CM | POA: Insufficient documentation

## 2017-09-18 DIAGNOSIS — D509 Iron deficiency anemia, unspecified: Secondary | ICD-10-CM | POA: Insufficient documentation

## 2017-09-18 DIAGNOSIS — Z79899 Other long term (current) drug therapy: Secondary | ICD-10-CM | POA: Diagnosis not present

## 2017-09-18 DIAGNOSIS — E119 Type 2 diabetes mellitus without complications: Secondary | ICD-10-CM | POA: Diagnosis not present

## 2017-09-18 DIAGNOSIS — K319 Disease of stomach and duodenum, unspecified: Secondary | ICD-10-CM | POA: Diagnosis not present

## 2017-09-18 DIAGNOSIS — K224 Dyskinesia of esophagus: Secondary | ICD-10-CM | POA: Diagnosis not present

## 2017-09-18 DIAGNOSIS — Z8711 Personal history of peptic ulcer disease: Secondary | ICD-10-CM | POA: Insufficient documentation

## 2017-09-18 DIAGNOSIS — B961 Klebsiella pneumoniae [K. pneumoniae] as the cause of diseases classified elsewhere: Secondary | ICD-10-CM | POA: Insufficient documentation

## 2017-09-18 LAB — CBC WITH DIFFERENTIAL/PLATELET
Basophils Absolute: 0 10*3/uL (ref 0–0.1)
Basophils Relative: 1 %
Eosinophils Absolute: 0.2 10*3/uL (ref 0–0.7)
Eosinophils Relative: 4 %
HCT: 30.2 % — ABNORMAL LOW (ref 35.0–47.0)
Hemoglobin: 9.8 g/dL — ABNORMAL LOW (ref 12.0–16.0)
Lymphocytes Relative: 17 %
Lymphs Abs: 1.1 10*3/uL (ref 1.0–3.6)
MCH: 27.9 pg (ref 26.0–34.0)
MCHC: 32.4 g/dL (ref 32.0–36.0)
MCV: 86 fL (ref 80.0–100.0)
Monocytes Absolute: 0.4 10*3/uL (ref 0.2–0.9)
Monocytes Relative: 6 %
Neutro Abs: 4.8 10*3/uL (ref 1.4–6.5)
Neutrophils Relative %: 72 %
Platelets: 329 10*3/uL (ref 150–440)
RBC: 3.52 MIL/uL — ABNORMAL LOW (ref 3.80–5.20)
RDW: 15.9 % — ABNORMAL HIGH (ref 11.5–14.5)
WBC: 6.5 10*3/uL (ref 3.6–11.0)

## 2017-09-18 LAB — IRON AND TIBC
Iron: 22 ug/dL — ABNORMAL LOW (ref 28–170)
Saturation Ratios: 7 % — ABNORMAL LOW (ref 10.4–31.8)
TIBC: 297 ug/dL (ref 250–450)
UIBC: 275 ug/dL

## 2017-09-18 LAB — SEDIMENTATION RATE: Sed Rate: 40 mm/hr — ABNORMAL HIGH (ref 0–30)

## 2017-09-18 LAB — FERRITIN: Ferritin: 40 ng/mL (ref 11–307)

## 2017-10-08 ENCOUNTER — Other Ambulatory Visit: Payer: Self-pay | Admitting: Gastroenterology

## 2017-10-08 DIAGNOSIS — D509 Iron deficiency anemia, unspecified: Secondary | ICD-10-CM

## 2017-10-14 ENCOUNTER — Ambulatory Visit
Admission: RE | Admit: 2017-10-14 | Discharge: 2017-10-14 | Disposition: A | Discharge status: Home / Self Care | Payer: Medicare Other | Source: Ambulatory Visit | Attending: Gastroenterology | Admitting: Gastroenterology

## 2017-10-14 DIAGNOSIS — D509 Iron deficiency anemia, unspecified: Secondary | ICD-10-CM | POA: Insufficient documentation

## 2017-10-18 ENCOUNTER — Encounter: Payer: Self-pay | Admitting: *Deleted

## 2017-10-21 ENCOUNTER — Ambulatory Visit: Payer: Medicare Other | Admitting: Anesthesiology

## 2017-10-21 ENCOUNTER — Encounter
Admission: RE | Disposition: A | Discharge status: Home / Self Care | Payer: Self-pay | Source: Ambulatory Visit | Attending: Gastroenterology

## 2017-10-21 ENCOUNTER — Ambulatory Visit
Admission: RE | Admit: 2017-10-21 | Discharge: 2017-10-21 | Disposition: A | Discharge status: Home / Self Care | Payer: Medicare Other | Source: Ambulatory Visit | Attending: Gastroenterology | Admitting: Gastroenterology

## 2017-10-21 DIAGNOSIS — K219 Gastro-esophageal reflux disease without esophagitis: Secondary | ICD-10-CM | POA: Insufficient documentation

## 2017-10-21 DIAGNOSIS — K2210 Ulcer of esophagus without bleeding: Secondary | ICD-10-CM | POA: Insufficient documentation

## 2017-10-21 DIAGNOSIS — Z7902 Long term (current) use of antithrombotics/antiplatelets: Secondary | ICD-10-CM | POA: Diagnosis not present

## 2017-10-21 DIAGNOSIS — E119 Type 2 diabetes mellitus without complications: Secondary | ICD-10-CM | POA: Insufficient documentation

## 2017-10-21 DIAGNOSIS — E785 Hyperlipidemia, unspecified: Secondary | ICD-10-CM | POA: Diagnosis not present

## 2017-10-21 DIAGNOSIS — Z85828 Personal history of other malignant neoplasm of skin: Secondary | ICD-10-CM | POA: Diagnosis not present

## 2017-10-21 DIAGNOSIS — Z87442 Personal history of urinary calculi: Secondary | ICD-10-CM | POA: Diagnosis not present

## 2017-10-21 DIAGNOSIS — K222 Esophageal obstruction: Secondary | ICD-10-CM | POA: Diagnosis not present

## 2017-10-21 DIAGNOSIS — Z79899 Other long term (current) drug therapy: Secondary | ICD-10-CM | POA: Diagnosis not present

## 2017-10-21 DIAGNOSIS — Z7984 Long term (current) use of oral hypoglycemic drugs: Secondary | ICD-10-CM | POA: Diagnosis not present

## 2017-10-21 DIAGNOSIS — I10 Essential (primary) hypertension: Secondary | ICD-10-CM | POA: Diagnosis not present

## 2017-10-21 DIAGNOSIS — K2950 Unspecified chronic gastritis without bleeding: Secondary | ICD-10-CM | POA: Insufficient documentation

## 2017-10-21 DIAGNOSIS — K259 Gastric ulcer, unspecified as acute or chronic, without hemorrhage or perforation: Secondary | ICD-10-CM | POA: Diagnosis not present

## 2017-10-21 DIAGNOSIS — D649 Anemia, unspecified: Secondary | ICD-10-CM | POA: Diagnosis not present

## 2017-10-21 HISTORY — DX: Gastro-esophageal reflux disease without esophagitis: K21.9

## 2017-10-21 HISTORY — PX: ESOPHAGOGASTRODUODENOSCOPY (EGD) WITH PROPOFOL: SHX5813

## 2017-10-21 LAB — GLUCOSE, CAPILLARY: Glucose-Capillary: 108 mg/dL — ABNORMAL HIGH (ref 65–99)

## 2017-10-21 SURGERY — ESOPHAGOGASTRODUODENOSCOPY (EGD) WITH PROPOFOL
Anesthesia: General

## 2017-10-21 MED ORDER — PROPOFOL 500 MG/50ML IV EMUL
INTRAVENOUS | Status: DC | PRN
  Administered 2017-10-21: 140 ug/kg/min via INTRAVENOUS

## 2017-10-21 MED ORDER — EPHEDRINE SULFATE 50 MG/ML IJ SOLN
INTRAMUSCULAR | Status: DC | PRN
  Administered 2017-10-21: 15 mg via INTRAVENOUS

## 2017-10-21 MED ORDER — MIDAZOLAM HCL 2 MG/2ML IJ SOLN
INTRAMUSCULAR | Status: AC
  Filled 2017-10-21: qty 2

## 2017-10-21 MED ORDER — LIDOCAINE HCL (CARDIAC) 20 MG/ML IV SOLN
INTRAVENOUS | Status: DC | PRN
  Administered 2017-10-21: 60 mg via INTRAVENOUS

## 2017-10-21 MED ORDER — GLYCOPYRROLATE 0.2 MG/ML IJ SOLN
INTRAMUSCULAR | Status: DC | PRN
  Administered 2017-10-21: 0.2 mg via INTRAVENOUS

## 2017-10-21 MED ORDER — PROPOFOL 10 MG/ML IV BOLUS
INTRAVENOUS | Status: DC | PRN
  Administered 2017-10-21: 20 mg via INTRAVENOUS
  Administered 2017-10-21: 10 mg via INTRAVENOUS
  Administered 2017-10-21: 30 mg via INTRAVENOUS
  Administered 2017-10-21: 40 mg via INTRAVENOUS

## 2017-10-21 MED ORDER — FENTANYL CITRATE (PF) 100 MCG/2ML IJ SOLN
INTRAMUSCULAR | Status: AC
  Filled 2017-10-21: qty 2

## 2017-10-21 MED ORDER — SODIUM CHLORIDE 0.9 % IV SOLN
INTRAVENOUS | Status: DC
  Administered 2017-10-21: 1000 mL via INTRAVENOUS
  Administered 2017-10-21: 11:00:00 via INTRAVENOUS

## 2017-10-21 MED ORDER — SODIUM CHLORIDE 0.9 % IV SOLN
INTRAVENOUS | Status: DC
  Administered 2017-10-21: 11:00:00 via INTRAVENOUS

## 2017-10-21 NOTE — H&P (Signed)
Outpatient short stay form Pre-procedure 10/21/2017 11:11 AM Lollie Sails MD  Primary Physician: Dr [name]  Reason for visit:  Follow-up esophageal ulcer, odynophagia, dysphagia,  History of present illness:  Patient is a 84 year old female presenting today as above. She has a history of symptomatic occlusion and subsequent stenting. She had an EGD for odynophagia on 06/20/2017. At that time she was found to have a prominent extrinsic compression to the esophagus the level of the aorta as well as a linear esophageal ulcer at about 37 cm from the eyes ears. The lesion was about 4-5 mm in length parallel to the lumen. There were no overhanging edges. these showed by refringent crystals and pigmented material this morning toward pill esophagitis. There is further some tortuosity in the distal esophagus although at that time there is no overt stenosis.  She continues to have a complaint of odynophagia. She states she is due to have a echocardiogram done however states her cardiologist does not feel that her symptoms are cardiac.  She does take Plavix and states she has held that for 6 days. She denies use of any other aspirin product or blood thinning agent.    Current Facility-Administered Medications:  .  0.9 %  sodium chloride infusion, , Intravenous, Continuous, Lollie Sails, MD, Last Rate: 20 mL/hr at 10/21/17 1028, 1,000 mL at 10/21/17 1028 .  0.9 %  sodium chloride infusion, , Intravenous, Continuous, Lollie Sails, MD  Medications Prior to Admission  Medication Sig Dispense Refill Last Dose  . acetaminophen (TYLENOL) 650 MG CR tablet Take 650 mg by mouth every 8 (eight) hours as needed for pain.   Past Week at Unknown time  . allopurinol (ZYLOPRIM) 100 MG tablet Take 100 mg by mouth at bedtime.   0 Past Week at Unknown time  . Bioflavonoid Products (VITAMIN C) CHEW Chew 2 tablets by mouth daily.   Past Week at Unknown time  . clopidogrel (PLAVIX) 75 MG tablet  take 1 tablet by mouth once daily 30 tablet 11 Past Week at Unknown time  . furosemide (LASIX) 40 MG tablet Take 40 mg by mouth 2 (two) times daily.   0 Past Week at Unknown time  . glipiZIDE (GLUCOTROL) 5 MG tablet Take 5 mg by mouth daily before breakfast.   Past Week at Unknown time  . hydrALAZINE (APRESOLINE) 50 MG tablet Take 50 mg by mouth 2 (two) times daily.    Past Week at Unknown time  . hydrochlorothiazide (HYDRODIURIL) 25 MG tablet Take 25 mg by mouth daily.  0 10/21/2017 at 0500  . HYDROcodone-acetaminophen (NORCO) 5-325 MG tablet Take 1 tablet by mouth every 6 (six) hours as needed for severe pain. 7 tablet 0 Past Week at Unknown time  . Hypromellose (GENTEAL MILD) 0.2 % SOLN Place 1 drop into both eyes 3 (three) times daily.   Past Week at Unknown time  . lisinopril (PRINIVIL,ZESTRIL) 40 MG tablet Take 40 mg by mouth daily.   0 10/21/2017 at 0500  . metoprolol succinate (TOPROL-XL) 50 MG 24 hr tablet Take 75 mg by mouth 2 (two) times daily.   0 10/21/2017 at 0500  . pantoprazole (PROTONIX) 40 MG tablet Take 40 mg by mouth 2 (two) times daily.   Past Week at Unknown time  . rOPINIRole (REQUIP) 2 MG tablet Take 2 mg by mouth 2 (two) times daily.   0 Past Week at Unknown time  . sucralfate (CARAFATE) 1 g tablet Take 1 g by mouth  3 (three) times daily as needed (for difficulty swallowing).   0 Past Week at Unknown time  . vitamin B-12 (CYANOCOBALAMIN) 1000 MCG tablet Take 1,000 mcg by mouth daily.   Past Week at Unknown time  . dicyclomine (BENTYL) 10 MG capsule Take 10 mg by mouth 3 (three) times daily.   Taking     Allergies  Allergen Reactions  . Gabapentin Swelling  . Lipitor [Atorvastatin] Other (See Comments)    Muscle aches  . Mevacor [Lovastatin] Other (See Comments)    Muscle aches  . Milk-Related Compounds     Large quantities cause headaches   . Septra [Sulfamethoxazole-Trimethoprim]   . Statins Other (See Comments)    Muscle pain  . Sulfur Diarrhea and Nausea And  Vomiting  . Zocor [Simvastatin] Other (See Comments)    Muscle aches     Past Medical History:  Diagnosis Date  . Anemia   . Arrhythmia   . Basal cell carcinoma of skin   . Brain tumor ([place])   . Brain tumor ([place])   . Cervical spine disease   . Diabetes mellitus without complication ([place])   . Esophageal ulcer without bleeding   . GERD (gastroesophageal reflux disease)   . Hyperlipemia   . Hypertension   . Kidney stones   . Leaky heart valve   . Meningioma ([place])     Review of systems:      Physical Exam    Heart and lungs: Regular rate and rhythm without rub or gallop lungs are clear    HEENT: Normocephalic atraumatic    Other:     Pertinant exam for procedure: Soft nontender nondistended bowel sounds positive normoactive    Planned proceedures: EGD and indicated procedures. I have discussed the risks benefits and complications of procedures to include not limited to bleeding, infection, perforation and the risk of sedation and the patient wishes to proceed.    Lollie Sails, MD Gastroenterology 10/21/2017  11:11 AM

## 2017-10-21 NOTE — Anesthesia Post-op Follow-up Note (Signed)
Anesthesia QCDR form completed.        

## 2017-10-21 NOTE — Anesthesia Preprocedure Evaluation (Signed)
Anesthesia Evaluation  Patient identified by MRN, date of birth, ID band Patient awake    Reviewed: Allergy & Precautions, NPO status , Patient's Chart, lab work & pertinent test results  History of Anesthesia Complications Negative for: history of anesthetic complications  Airway Mallampati: III  TM Distance: >3 FB Neck ROM: Full    Dental no notable dental hx.    Pulmonary neg pulmonary ROS, neg sleep apnea, neg COPD,    breath sounds clear to auscultation- rhonchi (-) wheezing      Cardiovascular hypertension, Pt. on medications + Peripheral Vascular Disease  (-) CAD, (-) Past MI and (-) Cardiac Stents  Rhythm:Regular Rate:Normal - Systolic murmurs and - Diastolic murmurs Echo 6/96/29: MILD LV SYSTOLIC DYSFUNCTION WITH MILD LVH NORMAL RIGHT VENTRICULAR SYSTOLIC FUNCTION MODERATE VALVULAR REGURGITATION Aortic: MILD AR Mitral: MODERATE MR Tricuspid: MODERATE TR Contraction: REGIONALLY IMPAIRED Closest EF: 45% (Estimated) Inferior: HYPOCONTRACTILE Lateral: HYPOCONTRACTILE Posterior: HYPOCONTRACTILE   Neuro/Psych negative neurological ROS  negative psych ROS   GI/Hepatic Neg liver ROS, PUD, GERD  ,  Endo/Other  diabetes, Oral Hypoglycemic Agents  Renal/GU Renal disease: hx of nephrolithiasis.     Musculoskeletal negative musculoskeletal ROS (+)   Abdominal (+) - obese,   Peds  Hematology  (+) anemia ,   Anesthesia Other Findings Past Medical History: No date: Anemia No date: Arrhythmia No date: Basal cell carcinoma of skin No date: Brain tumor ([place]) No date: Brain tumor ([place]) No date: Cervical spine disease No date: Diabetes mellitus without complication ([facility]) No date: Esophageal ulcer without bleeding No date: GERD (gastroesophageal reflux disease) No date: Hyperlipemia No date: Hypertension No date: Kidney stones No date: Leaky heart valve No date: Meningioma ([place])   Reproductive/Obstetrics                             Anesthesia Physical Anesthesia Plan  ASA: III  Anesthesia Plan: General   Post-op Pain Management:    Induction: Intravenous  PONV Risk Score and Plan: 2 and Propofol infusion  Airway Management Planned: Natural Airway  Additional Equipment:   Intra-op Plan:   Post-operative Plan:   Informed Consent: I have reviewed the patients History and Physical, chart, labs and discussed the procedure including the risks, benefits and alternatives for the proposed anesthesia with the patient or authorized representative who has indicated his/her understanding and acceptance.   Dental advisory given  Plan Discussed with: CRNA and Anesthesiologist  Anesthesia Plan Comments:         Anesthesia Quick Evaluation

## 2017-10-21 NOTE — Anesthesia Procedure Notes (Signed)
Date/Time: 10/21/2017 11:20 AM Performed by: Allean Found, CRNA Pre-anesthesia Checklist: Patient identified, Emergency Drugs available, Suction available, Patient being monitored and Timeout performed Patient Re-evaluated:Patient Re-evaluated prior to induction Oxygen Delivery Method: Nasal cannula Induction Type: IV induction Placement Confirmation: positive ETCO2

## 2017-10-21 NOTE — Transfer of Care (Signed)
Immediate Anesthesia Transfer of Care Note  Patient: [name] Day  Procedure(s) Performed: ESOPHAGOGASTRODUODENOSCOPY (EGD) WITH PROPOFOL (N/A )  Patient Location: PACU  Anesthesia Type:General  Level of Consciousness: awake  Airway & Oxygen Therapy: Patient Spontanous Breathing and Patient connected to nasal cannula oxygen  Post-op Assessment: Report given to RN and Post -op Vital signs reviewed and stable  Post vital signs: Reviewed and stable  Last Vitals:  Vitals:   10/21/17 1016 10/21/17 1200  BP: (!) 139/29   Pulse: (!) 44   Resp: 16   Temp: (!) 35.8 C (!) (P) 36 C  SpO2: 100%     Last Pain:  Vitals:   10/21/17 1200  TempSrc: (P) Tympanic         Complications: No apparent anesthesia complications

## 2017-10-21 NOTE — Op Note (Signed)
[facility] Gastroenterology Patient Name: [name] Day Procedure Date: 10/21/2017 11:12 AM MRN: [id number] Account #: [id number] Date of Birth: March 04, 1929 Admit Type: Outpatient Age: 84 Room: [facility] ENDO ROOM 3 Gender: Female Note Status: Finalized Procedure:            Upper GI endoscopy Indications:          Dysphagia, Odynophagia Providers:            Lollie Sails, MD Referring MD:         [name], MD (Referring MD) Medicines:            Monitored Anesthesia Care Complications:        No immediate complications. Procedure:            Pre-Anesthesia Assessment:                       - ASA Grade Assessment: III - A patient with severe                        systemic disease.                       After obtaining informed consent, the endoscope was                        passed under direct vision. Throughout the procedure,                        the patient's blood pressure, pulse, and oxygen                        saturations were monitored continuously. The Endoscope                        was introduced through the mouth, and advanced to the                        third part of duodenum. The upper GI endoscopy was                        accomplished without difficulty. The patient tolerated                        the procedure well. Findings:      A moderate-sized area of extrinsic compression was found in the mid       esophagus at about 34-35 cm from the alveolar ridge.      multiple sharp turns are noted in the esophagus starting at about 37 cm,       with ulceration noted from about 38 cm to the GE junction. Multiple       biopsies taken with a cold forcep. This area is very firm to biopsy.       There was a mild ring at the GE junction with evidence of esophagitis,       this was opened/dilated with disruption of the ring noted. with passage       of the scope, as well as an area about 2 cm proximal to this.      Diffuse mild  inflammation characterized by congestion (edema) and       erythema was found in the gastric  body and in the gastric antrum.       Biopsies were taken with a cold forceps for histology. Biopsies were       taken with a cold forceps for Helicobacter pylori testing.      One non-bleeding linear gastric ulcer, linear white base, with no       stigmata of bleeding was found on the posterior wall of the gastric       body. The lesion was 5 mm in largest dimension. Biopsies were taken with       a cold forceps for histology.      The cardia and gastric fundus were normal on retroflexion.      The examined duodenum was normal. Impression:           - Extrinsic compression in the mid esophagus.                       - Gastritis. Biopsied.                       - Non-bleeding gastric ulcer with no stigmata of                        bleeding. Biopsied.                       - Normal examined duodenum. Recommendation:       - Discharge patient to home.                       - Use Protonix (pantoprazole) 40 mg PO BID daily.                       - Await pathology results. Consider EUS distal                        esophagus.                       - Return to GI clinic in 10 days. Procedure Code(s):    --- Professional ---                       [id number], Esophagogastroduodenoscopy, flexible, transoral;                        with biopsy, single or multiple Diagnosis Code(s):    --- Professional ---                       K22.2, Esophageal obstruction                       K29.70, Gastritis, unspecified, without bleeding                       K25.9, Gastric ulcer, unspecified as acute or chronic,                        without hemorrhage or perforation                       R13.10, Dysphagia, unspecified CPT copyright 2016 [employer]. All rights reserved. The codes documented in this report are preliminary and upon coder review  may  be revised to meet current compliance  requirements. Lollie Sails, MD 10/21/2017 12:04:23 PM This report has been signed electronically. Number of Addenda: 0 Note Initiated On: 10/21/2017 11:12 AM      [facility]

## 2017-10-22 ENCOUNTER — Encounter: Payer: Self-pay | Admitting: Gastroenterology

## 2017-10-22 NOTE — Anesthesia Postprocedure Evaluation (Signed)
Anesthesia Post Note  Patient: [name] Day  Procedure(s) Performed: ESOPHAGOGASTRODUODENOSCOPY (EGD) WITH PROPOFOL (N/A )  Patient location during evaluation: Endoscopy Anesthesia Type: General Level of consciousness: awake and alert and oriented Pain management: pain level controlled Vital Signs Assessment: post-procedure vital signs reviewed and stable Respiratory status: spontaneous breathing, nonlabored ventilation and respiratory function stable Cardiovascular status: blood pressure returned to baseline and stable Postop Assessment: no signs of nausea or vomiting Anesthetic complications: no     Last Vitals:  Vitals:   10/21/17 1220 10/21/17 1230  BP: (!) 150/53 (!) 162/50  Pulse:    Resp:    Temp:    SpO2:      Last Pain:  Vitals:   10/21/17 1200  TempSrc: Tympanic                 Paradise Vensel

## 2017-10-29 ENCOUNTER — Ambulatory Visit (INDEPENDENT_AMBULATORY_CARE_PROVIDER_SITE_OTHER): Payer: Medicare Other | Admitting: Vascular Surgery

## 2017-10-29 ENCOUNTER — Encounter (INDEPENDENT_AMBULATORY_CARE_PROVIDER_SITE_OTHER): Payer: Medicare Other

## 2017-11-01 LAB — SURGICAL PATHOLOGY

## 2017-11-05 ENCOUNTER — Encounter (INDEPENDENT_AMBULATORY_CARE_PROVIDER_SITE_OTHER): Payer: Self-pay | Admitting: Vascular Surgery

## 2017-11-05 ENCOUNTER — Ambulatory Visit (INDEPENDENT_AMBULATORY_CARE_PROVIDER_SITE_OTHER): Payer: Medicare Other

## 2017-11-05 ENCOUNTER — Ambulatory Visit (INDEPENDENT_AMBULATORY_CARE_PROVIDER_SITE_OTHER): Payer: Medicare Other | Admitting: Vascular Surgery

## 2017-11-05 VITALS — BP 142/55 | HR 61 | Resp 16 | Ht 64.0 in | Wt 117.0 lb

## 2017-11-05 DIAGNOSIS — I10 Essential (primary) hypertension: Secondary | ICD-10-CM | POA: Diagnosis not present

## 2017-11-05 DIAGNOSIS — K559 Vascular disorder of intestine, unspecified: Secondary | ICD-10-CM

## 2017-11-05 DIAGNOSIS — E118 Type 2 diabetes mellitus with unspecified complications: Secondary | ICD-10-CM | POA: Diagnosis not present

## 2017-11-05 NOTE — Progress Notes (Signed)
MRN : [id number]  [name] is a 84 y.o. (Feb 24, 1929) female who presents with chief complaint of  Chief Complaint  Patient presents with  . Follow-up    6 month mesenteric  .  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  She says that her abdomen actually feels some better than they did the last time she saw me.  She is not having much postprandial abdominal pain and has been holding her weight.  She has gotten treatment for her ulcer disease and attributes the improvement to that.  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  Her IMA appears widely patent.  Current Outpatient Medications  Medication Sig Dispense Refill  . acetaminophen (TYLENOL) 650 MG CR tablet Take 650 mg by mouth every 8 (eight) hours as needed for pain.    Marland Kitchen allopurinol (ZYLOPRIM) 100 MG tablet Take 100 mg by mouth at bedtime.   0  . Bioflavonoid Products (VITAMIN C) CHEW Chew 2 tablets by mouth daily.    . clopidogrel (PLAVIX) 75 MG tablet take 1 tablet by mouth once daily 30 tablet 11  . dicyclomine (BENTYL) 10 MG capsule Take 10 mg by mouth 3 (three) times daily.    . furosemide (LASIX) 40 MG tablet Take 40 mg by mouth 2 (two) times daily.   0  . glipiZIDE (GLUCOTROL) 5 MG tablet Take 5 mg by mouth daily before breakfast.    . hydrALAZINE (APRESOLINE) 50 MG tablet Take 50 mg by mouth 2 (two) times daily.     . hydrochlorothiazide (HYDRODIURIL) 25 MG tablet Take 25 mg by mouth daily.  0  . Hypromellose (GENTEAL MILD) 0.2 % SOLN Place 1 drop into both eyes 3 (three) times daily.    Marland Kitchen lisinopril (PRINIVIL,ZESTRIL) 40 MG tablet Take 40 mg by mouth daily.   0  . metoprolol succinate (TOPROL-XL) 50 MG 24 hr tablet Take 75 mg by mouth 2 (two) times daily.   0  . pantoprazole (PROTONIX) 40 MG tablet Take 40 mg by mouth 2 (two) times daily.    Marland Kitchen rOPINIRole (REQUIP) 2 MG  tablet Take 2 mg by mouth 2 (two) times daily.   0  . sertraline (ZOLOFT) 25 MG tablet Take by mouth.    . sucralfate (CARAFATE) 1 g tablet Take 1 g by mouth 3 (three) times daily as needed (for difficulty swallowing).   0  . vitamin B-12 (CYANOCOBALAMIN) 1000 MCG tablet Take 1,000 mcg by mouth daily.    Marland Kitchen HYDROcodone-acetaminophen (NORCO) 5-325 MG tablet Take 1 tablet by mouth every 6 (six) hours as needed for severe pain. (Patient not taking: Reported on 11/05/2017) 7 tablet 0   No current facility-administered medications for this visit.     Past Medical History:  Diagnosis Date  . Anemia   . Arrhythmia   . Basal cell carcinoma of skin   . Brain tumor ([place])   . Brain tumor ([place])   . Cervical spine disease   . Diabetes mellitus without complication ([place])   . Esophageal ulcer without bleeding   . GERD (gastroesophageal reflux disease)   . Hyperlipemia   . Hypertension   . Kidney stones   . Leaky heart valve   . Meningioma [facility])     Past Surgical History:  Procedure Laterality Date  . ABDOMINAL HYSTERECTOMY    .  APPENDECTOMY    . COLONOSCOPY WITH PROPOFOL N/A 06/20/2017   Procedure: COLONOSCOPY WITH PROPOFOL;  Surgeon: Lollie Sails, MD;  Location: [facility] ENDOSCOPY;  Service: Endoscopy;  Laterality: N/A;  . ESOPHAGOGASTRODUODENOSCOPY (EGD) WITH PROPOFOL N/A 03/14/2015   Procedure: ESOPHAGOGASTRODUODENOSCOPY (EGD) WITH PROPOFOL;  Surgeon: Josefine Class, MD;  Location: [facility] ENDOSCOPY;  Service: Endoscopy;  Laterality: N/A;  . ESOPHAGOGASTRODUODENOSCOPY (EGD) WITH PROPOFOL N/A 03/28/2017   Procedure: ESOPHAGOGASTRODUODENOSCOPY (EGD) WITH PROPOFOL;  Surgeon: Lollie Sails, MD;  Location: [facility] ENDOSCOPY;  Service: Endoscopy;  Laterality: N/A;  . ESOPHAGOGASTRODUODENOSCOPY (EGD) WITH PROPOFOL N/A 06/20/2017   Procedure: ESOPHAGOGASTRODUODENOSCOPY (EGD) WITH PROPOFOL;  Surgeon: Lollie Sails, MD;  Location: [facility] ENDOSCOPY;  Service: Endoscopy;  Laterality: N/A;  .  ESOPHAGOGASTRODUODENOSCOPY (EGD) WITH PROPOFOL N/A 10/21/2017   Procedure: ESOPHAGOGASTRODUODENOSCOPY (EGD) WITH PROPOFOL;  Surgeon: Lollie Sails, MD;  Location: [facility] ENDOSCOPY;  Service: Endoscopy;  Laterality: N/A;  . EYE SURGERY    . HYSTERECTOMY ABDOMINAL WITH SALPINGECTOMY    . PERIPHERAL VASCULAR CATHETERIZATION N/A 01/23/2016   Procedure: Visceral Venography;  Surgeon: Algernon Huxley, MD;  Location: [place] CV LAB;  Service: Cardiovascular;  Laterality: N/A;  . PERIPHERAL VASCULAR CATHETERIZATION  01/23/2016   Procedure: Peripheral Vascular Intervention;  Surgeon: Algernon Huxley, MD;  Location: [place] CV LAB;  Service: Cardiovascular;;  . VISCERAL ANGIOGRAPHY N/A 03/18/2017   Procedure: Visceral Angiography;  Surgeon: Algernon Huxley, MD;  Location: [place] CV LAB;  Service: Cardiovascular;  Laterality: N/A;  . VISCERAL ARTERY INTERVENTION N/A 03/18/2017   Procedure: Visceral Artery Intervention;  Surgeon: Algernon Huxley, MD;  Location: [place] CV LAB;  Service: Cardiovascular;  Laterality: N/A;      Social History     Social History  Substance Use Topics  . Smoking status: Never Smoker  . Smokeless tobacco: Never Used  . Alcohol use No    Family History      Family History  Problem Relation Age of Onset  . Stroke Mother   . Hypertension Mother   . Cancer Father   . Heart attack Sister   . Diabetes Sister   . Heart attack Brother          Allergies  Allergen Reactions  . Gabapentin Swelling  . Lipitor [Atorvastatin] Other (See Comments)    Muscle aches  . Mevacor [Lovastatin] Other (See Comments)    Muscle aches  . Milk-Related Compounds     Large quantities cause headaches   . Statins Other (See Comments)    Muscle pain  . Sulfur Diarrhea and Nausea And Vomiting  . Zocor [Simvastatin] Other (See Comments)    Muscle aches     REVIEW OF SYSTEMS (Negative unless checked)  Constitutional: [x] Weight loss  [] Fever   [] Chills Cardiac: [] Chest pain   [] Chest pressure   [] Palpitations   [] Shortness of breath when laying flat   [] Shortness of breath at rest   [] Shortness of breath with exertion. Vascular:  [] Pain in legs with walking   [] Pain in legs at rest   [] Pain in legs when laying flat   [] Claudication   [] Pain in feet when walking  [] Pain in feet at rest  [] Pain in feet when laying flat   [] History of DVT   [] Phlebitis   [] Swelling in legs   [] Varicose veins   [] Non-healing ulcers Pulmonary:   [] Uses home oxygen   [] Productive cough   [] Hemoptysis   [] Wheeze  [] COPD   [] Asthma Neurologic:  [] Dizziness  [] Blackouts   []   Seizures   [] History of stroke   [] History of TIA  [] Aphasia   [] Temporary blindness   [] Dysphagia   [] Weakness or numbness in arms   [] Weakness or numbness in legs Musculoskeletal:  [x] Arthritis   [] Joint swelling   [] Joint pain   [] Low back pain Hematologic:  [] Easy bruising  [] Easy bleeding   [] Hypercoagulable state   [] Anemic   Gastrointestinal:  [] Blood in stool   [] Vomiting blood  [x] Gastroesophageal reflux/heartburn   [x] Abdominal pain Genitourinary:  [] Chronic kidney disease   [] Difficult urination  [] Frequent urination  [] Burning with urination   [] Hematuria Skin:  [] Rashes   [] Ulcers   [] Wounds Psychological:  [] History of anxiety   []  History of major depression.      Physical Examination  BP (!) 142/55 (BP Location: Left Arm, Patient Position: Sitting)   Pulse 61   Resp 16   Ht 5\' 4"  (1.626 m)   Wt 53.1 kg (117 lb)   BMI 20.08 kg/m  Gen:  Thin, NAD. Appears younger than stated age. Head: [place]/AT, No temporalis wasting. Ear/Nose/Throat: Hearing grossly intact, nares w/o erythema or drainage, trachea midline Eyes: Conjunctiva clear. Sclera non-icteric Neck: Supple.  No JVD.  Pulmonary:  Good air movement, no use of accessory muscles.  Cardiac: RRR, normal S1, S2 Vascular:  Vessel Right Left  Radial Palpable Palpable                                    Gastrointestinal: soft, non-tender/non-distended.  Musculoskeletal: M/S 5/5 throughout.  No deformity or atrophy. No edema. Neurologic: Sensation grossly intact in extremities.  Symmetrical.  Speech is fluent.  Psychiatric: Judgment intact, Mood & affect appropriate for pt's clinical situation. Dermatologic: No rashes or ulcers noted.  No cellulitis or open wounds.       Labs   Radiology Dg Colon Alonza Bogus Hi Den Cm  Result Date: 10/14/2017 CLINICAL DATA:  Iron deficiency anemia. EXAM: AIR CONTRAST BARIUM ENEMA TECHNIQUE: Initial scout AP supine abdominal image obtained to insure adequate colon cleansing. Barium was introduced into the colon in a retrograde fashion and refluxed from the rectum to the distal transverse colon. As much of the barium as possible was then removed through the indwelling tube via gravity drain. Air was then insufflated into the colon. Spot images of the colon followed by overhead radiographs were obtained. FLUOROSCOPY TIME:  Fluoroscopy Time:  1.1 minute Radiation Exposure Index (if provided by the fluoroscopic device): 18.8 mGy Number of Acquired Spot Images: 0 COMPARISON:  None. FINDINGS: An enema catheter was inserted and retention balloon distended under fluoroscopy. Barium followed by air was then introduced with satisfactory demonstration of the colon. Examination was negative for filling defects to indicate the presence of a polyp or mass lesion. There was no evidence of colonic stricture or obstruction. The mucosal pattern was normal. No tethering, inflammatory changes, or definite ulcerations identified. Scattered diverticular disease. KUB: There is no bowel dilatation to suggest obstruction. There is no evidence of pneumoperitoneum, portal venous gas or pneumatosis. There are left nephrolithiasis. There is generalized osteopenia. There is a levocurvature of the thoracolumbar spine. There is lumbar spine spondylosis. 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 IMPRESSION: No colonic mass, stricture or obstruction. Electronically Signed   By: Kathreen Devoid   On: 10/14/2017 10:42      Assessment/Plan Hypertension blood pressure control important in reducing the progression of atherosclerotic disease. On appropriate oral medications.  Type 2 diabetes mellitus with complication (HCC) blood glucose control important in reducing the progression of atherosclerotic disease. Also, involved in wound healing. On appropriate medications.    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  Her IMA appears widely patent. Symptomatically, she says she is better.  It is hard to recommend an intervention on a person who is symptomatically better even with these findings on duplex.  At this point, I will just plan a follow-up duplex in about 6 months unless she has worsening symptoms in the interim.    [name], MD  11/05/2017 12:46 PM    This note was created with Dragon medical transcription system.  Any errors from dictation are purely unintentional

## 2017-11-05 NOTE — Assessment & Plan Note (Signed)
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  Her IMA appears widely patent. Symptomatically, she says she is better.  It is hard to recommend an intervention on a person who is symptomatically better even with these findings on duplex.  At this point, I will just plan a follow-up duplex in about 6 months unless she has worsening symptoms in the interim.

## 2017-11-05 NOTE — H&P (View-Only) (Signed)
MRN : [id number]  [name] is a 84 y.o. (30-Nov-1928) female who presents with chief complaint of  Chief Complaint  Patient presents with  . Follow-up    6 month mesenteric  .  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  She says that her abdomen actually feels some better than they did the last time she saw me.  She is not having much postprandial abdominal pain and has been holding her weight.  She has gotten treatment for her ulcer disease and attributes the improvement to that.  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  Her IMA appears widely patent.  Current Outpatient Medications  Medication Sig Dispense Refill  . acetaminophen (TYLENOL) 650 MG CR tablet Take 650 mg by mouth every 8 (eight) hours as needed for pain.    Marland Kitchen allopurinol (ZYLOPRIM) 100 MG tablet Take 100 mg by mouth at bedtime.   0  . Bioflavonoid Products (VITAMIN C) CHEW Chew 2 tablets by mouth daily.    . clopidogrel (PLAVIX) 75 MG tablet take 1 tablet by mouth once daily 30 tablet 11  . dicyclomine (BENTYL) 10 MG capsule Take 10 mg by mouth 3 (three) times daily.    . furosemide (LASIX) 40 MG tablet Take 40 mg by mouth 2 (two) times daily.   0  . glipiZIDE (GLUCOTROL) 5 MG tablet Take 5 mg by mouth daily before breakfast.    . hydrALAZINE (APRESOLINE) 50 MG tablet Take 50 mg by mouth 2 (two) times daily.     . hydrochlorothiazide (HYDRODIURIL) 25 MG tablet Take 25 mg by mouth daily.  0  . Hypromellose (GENTEAL MILD) 0.2 % SOLN Place 1 drop into both eyes 3 (three) times daily.    Marland Kitchen lisinopril (PRINIVIL,ZESTRIL) 40 MG tablet Take 40 mg by mouth daily.   0  . metoprolol succinate (TOPROL-XL) 50 MG 24 hr tablet Take 75 mg by mouth 2 (two) times daily.   0  . pantoprazole (PROTONIX) 40 MG tablet Take 40 mg by mouth 2 (two) times daily.    Marland Kitchen rOPINIRole (REQUIP) 2 MG  tablet Take 2 mg by mouth 2 (two) times daily.   0  . sertraline (ZOLOFT) 25 MG tablet Take by mouth.    . sucralfate (CARAFATE) 1 g tablet Take 1 g by mouth 3 (three) times daily as needed (for difficulty swallowing).   0  . vitamin B-12 (CYANOCOBALAMIN) 1000 MCG tablet Take 1,000 mcg by mouth daily.    Marland Kitchen HYDROcodone-acetaminophen (NORCO) 5-325 MG tablet Take 1 tablet by mouth every 6 (six) hours as needed for severe pain. (Patient not taking: Reported on 11/05/2017) 7 tablet 0   No current facility-administered medications for this visit.     Past Medical History:  Diagnosis Date  . Anemia   . Arrhythmia   . Basal cell carcinoma of skin   . Brain tumor ([place])   . Brain tumor ([place])   . Cervical spine disease   . Diabetes mellitus without complication ([place])   . Esophageal ulcer without bleeding   . GERD (gastroesophageal reflux disease)   . Hyperlipemia   . Hypertension   . Kidney stones   . Leaky heart valve   . Meningioma [facility])     Past Surgical History:  Procedure Laterality Date  . ABDOMINAL HYSTERECTOMY    .  APPENDECTOMY    . COLONOSCOPY WITH PROPOFOL N/A 06/20/2017   Procedure: COLONOSCOPY WITH PROPOFOL;  Surgeon: Lollie Sails, MD;  Location: [facility] ENDOSCOPY;  Service: Endoscopy;  Laterality: N/A;  . ESOPHAGOGASTRODUODENOSCOPY (EGD) WITH PROPOFOL N/A 03/14/2015   Procedure: ESOPHAGOGASTRODUODENOSCOPY (EGD) WITH PROPOFOL;  Surgeon: Josefine Class, MD;  Location: [facility] ENDOSCOPY;  Service: Endoscopy;  Laterality: N/A;  . ESOPHAGOGASTRODUODENOSCOPY (EGD) WITH PROPOFOL N/A 03/28/2017   Procedure: ESOPHAGOGASTRODUODENOSCOPY (EGD) WITH PROPOFOL;  Surgeon: Lollie Sails, MD;  Location: [facility] ENDOSCOPY;  Service: Endoscopy;  Laterality: N/A;  . ESOPHAGOGASTRODUODENOSCOPY (EGD) WITH PROPOFOL N/A 06/20/2017   Procedure: ESOPHAGOGASTRODUODENOSCOPY (EGD) WITH PROPOFOL;  Surgeon: Lollie Sails, MD;  Location: [facility] ENDOSCOPY;  Service: Endoscopy;  Laterality: N/A;  .  ESOPHAGOGASTRODUODENOSCOPY (EGD) WITH PROPOFOL N/A 10/21/2017   Procedure: ESOPHAGOGASTRODUODENOSCOPY (EGD) WITH PROPOFOL;  Surgeon: Lollie Sails, MD;  Location: [facility] ENDOSCOPY;  Service: Endoscopy;  Laterality: N/A;  . EYE SURGERY    . HYSTERECTOMY ABDOMINAL WITH SALPINGECTOMY    . PERIPHERAL VASCULAR CATHETERIZATION N/A 01/23/2016   Procedure: Visceral Venography;  Surgeon: Algernon Huxley, MD;  Location: [place] CV LAB;  Service: Cardiovascular;  Laterality: N/A;  . PERIPHERAL VASCULAR CATHETERIZATION  01/23/2016   Procedure: Peripheral Vascular Intervention;  Surgeon: Algernon Huxley, MD;  Location: [place] CV LAB;  Service: Cardiovascular;;  . VISCERAL ANGIOGRAPHY N/A 03/18/2017   Procedure: Visceral Angiography;  Surgeon: Algernon Huxley, MD;  Location: [place] CV LAB;  Service: Cardiovascular;  Laterality: N/A;  . VISCERAL ARTERY INTERVENTION N/A 03/18/2017   Procedure: Visceral Artery Intervention;  Surgeon: Algernon Huxley, MD;  Location: [place] CV LAB;  Service: Cardiovascular;  Laterality: N/A;      Social History     Social History  Substance Use Topics  . Smoking status: Never Smoker  . Smokeless tobacco: Never Used  . Alcohol use No    Family History      Family History  Problem Relation Age of Onset  . Stroke Mother   . Hypertension Mother   . Cancer Father   . Heart attack Sister   . Diabetes Sister   . Heart attack Brother          Allergies  Allergen Reactions  . Gabapentin Swelling  . Lipitor [Atorvastatin] Other (See Comments)    Muscle aches  . Mevacor [Lovastatin] Other (See Comments)    Muscle aches  . Milk-Related Compounds     Large quantities cause headaches   . Statins Other (See Comments)    Muscle pain  . Sulfur Diarrhea and Nausea And Vomiting  . Zocor [Simvastatin] Other (See Comments)    Muscle aches     REVIEW OF SYSTEMS (Negative unless checked)  Constitutional: [x] Weight loss  [] Fever   [] Chills Cardiac: [] Chest pain   [] Chest pressure   [] Palpitations   [] Shortness of breath when laying flat   [] Shortness of breath at rest   [] Shortness of breath with exertion. Vascular:  [] Pain in legs with walking   [] Pain in legs at rest   [] Pain in legs when laying flat   [] Claudication   [] Pain in feet when walking  [] Pain in feet at rest  [] Pain in feet when laying flat   [] History of DVT   [] Phlebitis   [] Swelling in legs   [] Varicose veins   [] Non-healing ulcers Pulmonary:   [] Uses home oxygen   [] Productive cough   [] Hemoptysis   [] Wheeze  [] COPD   [] Asthma Neurologic:  [] Dizziness  [] Blackouts   []   Seizures   [] History of stroke   [] History of TIA  [] Aphasia   [] Temporary blindness   [] Dysphagia   [] Weakness or numbness in arms   [] Weakness or numbness in legs Musculoskeletal:  [x] Arthritis   [] Joint swelling   [] Joint pain   [] Low back pain Hematologic:  [] Easy bruising  [] Easy bleeding   [] Hypercoagulable state   [] Anemic   Gastrointestinal:  [] Blood in stool   [] Vomiting blood  [x] Gastroesophageal reflux/heartburn   [x] Abdominal pain Genitourinary:  [] Chronic kidney disease   [] Difficult urination  [] Frequent urination  [] Burning with urination   [] Hematuria Skin:  [] Rashes   [] Ulcers   [] Wounds Psychological:  [] History of anxiety   []  History of major depression.      Physical Examination  BP (!) 142/55 (BP Location: Left Arm, Patient Position: Sitting)   Pulse 61   Resp 16   Ht 5\' 4"  (1.626 m)   Wt 53.1 kg (117 lb)   BMI 20.08 kg/m  Gen:  Thin, NAD. Appears younger than stated age. Head: [place]/AT, No temporalis wasting. Ear/Nose/Throat: Hearing grossly intact, nares w/o erythema or drainage, trachea midline Eyes: Conjunctiva clear. Sclera non-icteric Neck: Supple.  No JVD.  Pulmonary:  Good air movement, no use of accessory muscles.  Cardiac: RRR, normal S1, S2 Vascular:  Vessel Right Left  Radial Palpable Palpable                                    Gastrointestinal: soft, non-tender/non-distended.  Musculoskeletal: M/S 5/5 throughout.  No deformity or atrophy. No edema. Neurologic: Sensation grossly intact in extremities.  Symmetrical.  Speech is fluent.  Psychiatric: Judgment intact, Mood & affect appropriate for pt's clinical situation. Dermatologic: No rashes or ulcers noted.  No cellulitis or open wounds.       Labs   Radiology Dg Colon Alonza Bogus Hi Den Cm  Result Date: 10/14/2017 CLINICAL DATA:  Iron deficiency anemia. EXAM: AIR CONTRAST BARIUM ENEMA TECHNIQUE: Initial scout AP supine abdominal image obtained to insure adequate colon cleansing. Barium was introduced into the colon in a retrograde fashion and refluxed from the rectum to the distal transverse colon. As much of the barium as possible was then removed through the indwelling tube via gravity drain. Air was then insufflated into the colon. Spot images of the colon followed by overhead radiographs were obtained. FLUOROSCOPY TIME:  Fluoroscopy Time:  1.1 minute Radiation Exposure Index (if provided by the fluoroscopic device): 18.8 mGy Number of Acquired Spot Images: 0 COMPARISON:  None. FINDINGS: An enema catheter was inserted and retention balloon distended under fluoroscopy. Barium followed by air was then introduced with satisfactory demonstration of the colon. Examination was negative for filling defects to indicate the presence of a polyp or mass lesion. There was no evidence of colonic stricture or obstruction. The mucosal pattern was normal. No tethering, inflammatory changes, or definite ulcerations identified. Scattered diverticular disease. KUB: There is no bowel dilatation to suggest obstruction. There is no evidence of pneumoperitoneum, portal venous gas or pneumatosis. There are left nephrolithiasis. There is generalized osteopenia. There is a levocurvature of the thoracolumbar spine. There is lumbar spine spondylosis. 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 IMPRESSION: No colonic mass, stricture or obstruction. Electronically Signed   By: Kathreen Devoid   On: 10/14/2017 10:42      Assessment/Plan Hypertension blood pressure control important in reducing the progression of atherosclerotic disease. On appropriate oral medications.  Type 2 diabetes mellitus with complication (HCC) blood glucose control important in reducing the progression of atherosclerotic disease. Also, involved in wound healing. On appropriate medications.    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  Her IMA appears widely patent. Symptomatically, she says she is better.  It is hard to recommend an intervention on a person who is symptomatically better even with these findings on duplex.  At this point, I will just plan a follow-up duplex in about 6 months unless she has worsening symptoms in the interim.    [name], MD  11/05/2017 12:46 PM    This note was created with Dragon medical transcription system.  Any errors from dictation are purely unintentional

## 2017-11-08 ENCOUNTER — Telehealth: Payer: Self-pay

## 2017-11-08 NOTE — Telephone Encounter (Signed)
Received referral for EUS to evaluate extrinsic esophageal compression. EUS scheduled for 12/05/17 at [place] with Dr. Francella Solian. I have spoken with Ms. Day and explained procedure. I will meet with her during her appointment with Dr. Mike Gip on 2/21 for full instructions and give her a copy of instructions. I will obtain clearance to hold Plavix from Dr. Ubaldo Glassing and educate her regarding. Oncology Nurse Navigator Documentation  Navigator Location: [facility] (11/08/17 0900)   )Navigator Encounter Type: Telephone (11/08/17 0900) Telephone: Lahoma Crocker Call (11/08/17 0900)                       Barriers/Navigation Needs: Education;Coordination of Care (11/08/17 0900)   Interventions: Coordination of Care (11/08/17 0900)   Coordination of Care: EUS (11/08/17 0900)                  Time Spent with Patient: 45 (11/08/17 0900)

## 2017-11-19 ENCOUNTER — Other Ambulatory Visit: Payer: Self-pay | Admitting: Urgent Care

## 2017-11-19 ENCOUNTER — Telehealth: Payer: Self-pay

## 2017-11-19 ENCOUNTER — Inpatient Hospital Stay: Payer: Medicare Other | Attending: Hematology and Oncology

## 2017-11-19 DIAGNOSIS — K2210 Ulcer of esophagus without bleeding: Secondary | ICD-10-CM | POA: Diagnosis not present

## 2017-11-19 DIAGNOSIS — D509 Iron deficiency anemia, unspecified: Secondary | ICD-10-CM | POA: Insufficient documentation

## 2017-11-19 DIAGNOSIS — D508 Other iron deficiency anemias: Secondary | ICD-10-CM

## 2017-11-19 DIAGNOSIS — R040 Epistaxis: Secondary | ICD-10-CM | POA: Diagnosis not present

## 2017-11-19 LAB — CBC WITH DIFFERENTIAL/PLATELET
Basophils Absolute: 0.1 10*3/uL (ref 0–0.1)
Basophils Relative: 1 %
Eosinophils Absolute: 0.2 10*3/uL (ref 0–0.7)
Eosinophils Relative: 3 %
HCT: 28.1 % — ABNORMAL LOW (ref 35.0–47.0)
Hemoglobin: 9.1 g/dL — ABNORMAL LOW (ref 12.0–16.0)
Lymphocytes Relative: 16 %
Lymphs Abs: 1.1 10*3/uL (ref 1.0–3.6)
MCH: 27.4 pg (ref 26.0–34.0)
MCHC: 32.6 g/dL (ref 32.0–36.0)
MCV: 84.1 fL (ref 80.0–100.0)
Monocytes Absolute: 0.4 10*3/uL (ref 0.2–0.9)
Monocytes Relative: 6 %
Neutro Abs: 5.1 10*3/uL (ref 1.4–6.5)
Neutrophils Relative %: 74 %
Platelets: 313 10*3/uL (ref 150–440)
RBC: 3.34 MIL/uL — ABNORMAL LOW (ref 3.80–5.20)
RDW: 15 % — ABNORMAL HIGH (ref 11.5–14.5)
WBC: 7 10*3/uL (ref 3.6–11.0)

## 2017-11-19 LAB — FERRITIN: Ferritin: 9 ng/mL — ABNORMAL LOW (ref 11–307)

## 2017-11-19 NOTE — Telephone Encounter (Signed)
Voicemail left with Dr. Bethanne Ginger office. Permission to hold Plavix sent to his office 11/08/17. No notification has been received at this time. She is coming in for EUS instructions 2/21. Oncology Nurse Navigator Documentation  Navigator Location: [facility] (11/19/17 0800)   )Navigator Encounter Type: Telephone (11/19/17 0800)                                 Coordination of Care: EUS (11/19/17 0800)                  Time Spent with Patient: 15 (11/19/17 0800)

## 2017-11-21 ENCOUNTER — Inpatient Hospital Stay (HOSPITAL_BASED_OUTPATIENT_CLINIC_OR_DEPARTMENT_OTHER): Payer: Medicare Other | Admitting: Hematology and Oncology

## 2017-11-21 ENCOUNTER — Encounter: Payer: Self-pay | Admitting: Hematology and Oncology

## 2017-11-21 ENCOUNTER — Other Ambulatory Visit: Payer: Self-pay

## 2017-11-21 ENCOUNTER — Inpatient Hospital Stay: Payer: Medicare Other

## 2017-11-21 VITALS — BP 130/51 | HR 62 | Temp 97.9°F | Resp 20 | Ht 64.0 in | Wt 120.0 lb

## 2017-11-21 VITALS — BP 155/78 | HR 67 | Resp 20

## 2017-11-21 DIAGNOSIS — K2210 Ulcer of esophagus without bleeding: Secondary | ICD-10-CM | POA: Diagnosis not present

## 2017-11-21 DIAGNOSIS — R040 Epistaxis: Secondary | ICD-10-CM

## 2017-11-21 DIAGNOSIS — D508 Other iron deficiency anemias: Secondary | ICD-10-CM

## 2017-11-21 DIAGNOSIS — D509 Iron deficiency anemia, unspecified: Secondary | ICD-10-CM | POA: Diagnosis not present

## 2017-11-21 MED ORDER — IRON SUCROSE 20 MG/ML IV SOLN
200.0000 mg | Freq: Once | INTRAVENOUS | Status: AC
  Administered 2017-11-21: 200 mg via INTRAVENOUS
  Filled 2017-11-21: qty 10

## 2017-11-21 MED ORDER — SODIUM CHLORIDE 0.9 % IV SOLN
Freq: Once | INTRAVENOUS | Status: AC
  Administered 2017-11-21: 15:00:00 via INTRAVENOUS
  Filled 2017-11-21: qty 1000

## 2017-11-21 NOTE — Progress Notes (Signed)
[facility] [name]: 11/21/2017   Chief Complaint: [name] is a 84 y.o. female with iron deficiency anemia who is seen for 4 month assessment.  HPI:   The patient was last seen in the hematology clinic on 07/19/2017.  At that time, she felt "ok".  Patient had developed diffuse abdominal pain after stopping her Carafate x 10 days. After restarting her medication, the pain improved. Exam was unremarkable. Hematocrit was 32.8 and hemoglobin 10.7. Platelets 337,000. Ferritin was 100.  CBC on 09/18/2017 revealed a hematocrit of 30.2, hemoglobin 9.8, and MCV 86.  Ferritin was 40.  CBC on 11/19/2017 revealed a hematocrit of 28.1, hemoglobin 9.1 and MCV 84.1.  Ferritin was 9.  Symptomatically, patient has continued dysphagia. It takes a long time for her to eat. She notes that she is unable to drink anything cold because causes her pain to increase. She drinks 2 Glucerna a [name].  She is scheduled for an EUS on 12/05/2017 with Dr. Francella Solian. Patient has gained 3 pounds.   Patient has dry skin. Her skin is pruritic. Patient has frequent epistaxis events. She has added a humidifier to her home. Skin and epistaxis episodes have improved. Patient notes chronic venous stasis to her bilateral lower extremities. She is wearing TED hose intermittently. She states, "They are weeping something bad".  She denies pain in the clinic today.    Past Medical History:  Diagnosis Date  . Anemia   . Arrhythmia   . Basal cell carcinoma of skin   . Brain tumor ([place])   . Brain tumor ([place])   . Cervical spine disease   . Diabetes mellitus without complication ([place])   . Esophageal ulcer without bleeding   . GERD (gastroesophageal reflux disease)   . Hyperlipemia   . Hypertension   . Kidney stones   . Leaky heart valve   . Meningioma [facility])     Past Surgical History:  Procedure Laterality Date  . ABDOMINAL HYSTERECTOMY    . APPENDECTOMY    . COLONOSCOPY WITH PROPOFOL  N/A 06/20/2017   Procedure: COLONOSCOPY WITH PROPOFOL;  Surgeon: Lollie Sails, MD;  Location: [facility] ENDOSCOPY;  Service: Endoscopy;  Laterality: N/A;  . ESOPHAGOGASTRODUODENOSCOPY (EGD) WITH PROPOFOL N/A 03/14/2015   Procedure: ESOPHAGOGASTRODUODENOSCOPY (EGD) WITH PROPOFOL;  Surgeon: Josefine Class, MD;  Location: [facility] ENDOSCOPY;  Service: Endoscopy;  Laterality: N/A;  . ESOPHAGOGASTRODUODENOSCOPY (EGD) WITH PROPOFOL N/A 03/28/2017   Procedure: ESOPHAGOGASTRODUODENOSCOPY (EGD) WITH PROPOFOL;  Surgeon: Lollie Sails, MD;  Location: [facility] ENDOSCOPY;  Service: Endoscopy;  Laterality: N/A;  . ESOPHAGOGASTRODUODENOSCOPY (EGD) WITH PROPOFOL N/A 06/20/2017   Procedure: ESOPHAGOGASTRODUODENOSCOPY (EGD) WITH PROPOFOL;  Surgeon: Lollie Sails, MD;  Location: [facility] ENDOSCOPY;  Service: Endoscopy;  Laterality: N/A;  . ESOPHAGOGASTRODUODENOSCOPY (EGD) WITH PROPOFOL N/A 10/21/2017   Procedure: ESOPHAGOGASTRODUODENOSCOPY (EGD) WITH PROPOFOL;  Surgeon: Lollie Sails, MD;  Location: [facility] ENDOSCOPY;  Service: Endoscopy;  Laterality: N/A;  . EYE SURGERY    . HYSTERECTOMY ABDOMINAL WITH SALPINGECTOMY    . PERIPHERAL VASCULAR CATHETERIZATION N/A 01/23/2016   Procedure: Visceral Venography;  Surgeon: Algernon Huxley, MD;  Location: [place] CV LAB;  Service: Cardiovascular;  Laterality: N/A;  . PERIPHERAL VASCULAR CATHETERIZATION  01/23/2016   Procedure: Peripheral Vascular Intervention;  Surgeon: Algernon Huxley, MD;  Location: [place] CV LAB;  Service: Cardiovascular;;  . VISCERAL ANGIOGRAPHY N/A 03/18/2017   Procedure: Visceral Angiography;  Surgeon: Algernon Huxley, MD;  Location: [place] INVASIVE CV  LAB;  Service: Cardiovascular;  Laterality: N/A;  . VISCERAL ARTERY INTERVENTION N/A 03/18/2017   Procedure: Visceral Artery Intervention;  Surgeon: Algernon Huxley, MD;  Location: [place] CV LAB;  Service: Cardiovascular;  Laterality: N/A;    Family History  Problem Relation Age of Onset  . Stroke  Mother   . Hypertension Mother   . Cancer Father   . Heart attack Sister   . Diabetes Sister   . Heart attack Brother     Social History:  reports that  has never smoked. she has never used smokeless tobacco. She reports that she does not drink alcohol or use drugs.  She denies any exposure to radiation or toxins.  She is retired.  She worked at [employer].  She lives in [place].  Patient's contact number is [phone].  Daughter's number is [phone] [name]).  The patient is accompanied by her daughter today.   Allergies:  Allergies  Allergen Reactions  . Gabapentin Swelling  . Lipitor [Atorvastatin] Other (See Comments)    Muscle aches  . Mevacor [Lovastatin] Other (See Comments)    Muscle aches  . Milk-Related Compounds     Large quantities cause headaches   . Septra [Sulfamethoxazole-Trimethoprim]   . Statins Other (See Comments)    Muscle pain  . Sulfur Diarrhea and Nausea And Vomiting  . Zocor [Simvastatin] Other (See Comments)    Muscle aches    Current Medications: Current Outpatient Medications  Medication Sig Dispense Refill  . acetaminophen (TYLENOL) 650 MG CR tablet Take 650 mg by mouth every 8 (eight) hours as needed for pain.    Marland Kitchen allopurinol (ZYLOPRIM) 100 MG tablet Take 100 mg by mouth at bedtime.   0  . Bioflavonoid Products (VITAMIN C) CHEW Chew 2 tablets by mouth daily.    . clopidogrel (PLAVIX) 75 MG tablet take 1 tablet by mouth once daily 30 tablet 11  . dicyclomine (BENTYL) 10 MG capsule Take 10 mg by mouth 3 (three) times daily.    . furosemide (LASIX) 40 MG tablet Take 40 mg by mouth 2 (two) times daily.   0  . glipiZIDE (GLUCOTROL) 5 MG tablet Take 5 mg by mouth daily before breakfast.    . hydrALAZINE (APRESOLINE) 50 MG tablet Take 50 mg by mouth 2 (two) times daily.     . hydrochlorothiazide (HYDRODIURIL) 25 MG tablet Take 25 mg by mouth daily.  0  . lisinopril (PRINIVIL,ZESTRIL) 40 MG tablet Take 40 mg by mouth daily.   0  .  metoprolol succinate (TOPROL-XL) 50 MG 24 hr tablet Take 75 mg by mouth 2 (two) times daily.   0  . pantoprazole (PROTONIX) 40 MG tablet Take 40 mg by mouth 2 (two) times daily.    Marland Kitchen rOPINIRole (REQUIP) 2 MG tablet Take 2 mg by mouth 2 (two) times daily.   0  . sucralfate (CARAFATE) 1 g tablet Take 1 g by mouth 3 (three) times daily as needed (for difficulty swallowing).   0  . vitamin B-12 (CYANOCOBALAMIN) 1000 MCG tablet Take 1,000 mcg by mouth daily.    . Hypromellose (GENTEAL MILD) 0.2 % SOLN Place 1 drop into both eyes 3 (three) times daily.    . sertraline (ZOLOFT) 25 MG tablet Take 25 mg by mouth daily.      No current facility-administered medications for this visit.     Review of Systems:  GENERAL: Feels "ok".  No fevers, sweats .  Weight up 3 pounds. PERFORMANCE  STATUS (ECOG):  1 HEENT:  Vision issues (chronic).  No runny nose, sore throat, mouth sores or tenderness.  Epistaxis. Lungs: No shortness of breath or cough.  No hemoptysis. Cardiac:  No chest pain, palpitations, orthopnea, or PND. GI: Dysphagia.  No nausea, vomiting, diarrhea, constipation, or melena. GU:  Frequency.  Voids small amount. Musculoskeletal:  No back pain.  Chronic arthritis pain.  No muscle tenderness. Extremities:  No pain or swelling. Skin:  Dry pruritic skin.  No rashes or skin changes. Neuro:  No headache, numbness or weakness, balance or coordination issues. Endocrine:  No diabetes.  Thyroid disease on Synthroid.  No hot flashes or night sweats. Psych:  No mood changes, depression or anxiety. Pain:  No focal pain. Review of systems:  All other systems reviewed and found to be negative.  Physical Exam: Blood pressure (!) 130/51, pulse 62, temperature 97.9 F (36.6 C), temperature source Tympanic, resp. rate 20, height 5\' 4"  (1.626 m), weight 120 lb (54.4 kg). GENERAL:  Thin elderly woman sitting comfortably a wheelchair in the exam room in no acute distress.  She has a 4 point cane at her  side. MENTAL STATUS:  Alert and oriented to person, place and time. HEAD:  Pearline Cables hair pulled up.  Normocephalic, atraumatic, face symmetric, no Cushingoid features. EYES:  Glasses.  Pupils equal round and reactive to light and accomodation.  No conjunctivitis or scleral icterus. ENT:  Oropharynx clear without lesion.  Tongue normal. Mucous membranes dry.  RESPIRATORY:  Clear to auscultation without rales, wheezes or rhonchi. CARDIOVASCULAR:  Regular rate and rhythm without murmur, rub or gallop. ABDOMEN:  Soft, non-tender, with active bowel sounds, and no hepatosplenomegaly.  No masses. SKIN:  No rashes, ulcers or lesions. EXTREMITIES: Chronic lower extremity edema (left > right).  No skin discoloration or tenderness.  No palpable cords. LYMPH NODES: No palpable cervical, supraclavicular, axillary or inguinal adenopathy  NEUROLOGICAL: Unremarkable. PSYCH:  Appropriate.   Appointment on 11/19/2017  Component Date Value Ref Range Status  . Ferritin 11/19/2017 9* 11 - 307 ng/mL Final   Performed at [facility], [address] [place], [place] [address]  . WBC 11/19/2017 7.0  3.6 - 11.0 K/uL Final  . RBC 11/19/2017 3.34* 3.80 - 5.20 MIL/uL Final  . Hemoglobin 11/19/2017 9.1* 12.0 - 16.0 g/dL Final  . HCT 11/19/2017 28.1* 35.0 - 47.0 % Final  . MCV 11/19/2017 84.1  80.0 - 100.0 fL Final  . MCH 11/19/2017 27.4  26.0 - 34.0 pg Final  . MCHC 11/19/2017 32.6  32.0 - 36.0 g/dL Final  . RDW 11/19/2017 15.0* 11.5 - 14.5 % Final  . Platelets 11/19/2017 313  150 - 440 K/uL Final  . Neutrophils Relative % 11/19/2017 74  % Final  . Neutro Abs 11/19/2017 5.1  1.4 - 6.5 K/uL Final  . Lymphocytes Relative 11/19/2017 16  % Final  . Lymphs Abs 11/19/2017 1.1  1.0 - 3.6 K/uL Final  . Monocytes Relative 11/19/2017 6  % Final  . Monocytes Absolute 11/19/2017 0.4  0.2 - 0.9 K/uL Final  . Eosinophils Relative 11/19/2017 3  % Final  . Eosinophils Absolute 11/19/2017 0.2  0 - 0.7 K/uL Final  .  Basophils Relative 11/19/2017 1  % Final  . Basophils Absolute 11/19/2017 0.1  0 - 0.1 K/uL Final   Performed at [facility], [address] [place], [place] [address]    Assessment:  [name] is a 84 y.o. female with iron deficiency  anemia.  She has been on oral iron x 1 year with continued decline in her counts.  She has dysphagia and odynophagia.  She is on  PPI and carafate.  Diet is modest.  She denies pica.  Abdomen and pelvic CT on 02/19/2017 revealed no acute abnormality.  She has mild sigmoid diverticulosis.  CBC on 02/26/2017 revealed a hematocrit of 25.3, hemoglobin 8.0, and MCV 83.2.  Hemoccult studies x 1 were positive in 11/19/2016 and negative x 2 in 01/2017.  Ferritin was 5 with an iron saturation of 4%, and a TIBC of 487 on 01/30/2017.  Normal studies included:  creatinine (1.0), calcium, albumen, B12 (647), and TSH.  Folic acid was 85.4 on 08/09/2015.  Urinalysis revealed no blood on 01/23/2017.  She received Venofer weekly x 3 (02/28/2017 - 03/14/2017), x 2 (04/19/2017 - 04/26/2017), and x 2 (06/14/2017 and 06/21/2017).   Ferritin has been followed: 8 on 02/28/2017, 164 on 03/19/2017, 27 on 04/18/2017, 82 on 05/17/2017, 38 on 06/10/2017, 100 on 07/18/2017, 40 on 09/18/2017, and 9 on 11/19/2017.  EGD in 2016 revealed gastric ulcers and esophageal stricture which was dilated.  Barium swallow on 11/15/2015 was normal.  She has a history of mesenteric ischemia.  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  She was on Coumadin, and now on Plavix.  Colonoscopy in 2008 revealed diverticulosis.   EGD on 03/28/2017 revealed a moderate-sized area of extrinsic compression was found in the mid esophagus from about 25-28 cm, no apparent defect or abnormality in the mucosa or esophageal wall. There was a 2 cm cratered esophageal ulcer.  There was segmental moderate mucosal changes characterized by granularity at the gastroesophageal junction.  There was localized mild  inflammation characterized by congestion (edema), depression and erythema was found on the greater curvature of the gastric body.  Biopsies revealed no H pylori, dysplasia or malignancy.  She is on pantoprazole and Carafate.  She has esophageal spasms improved with Bentyl.  EGD on 06/20/2017 revealed a non-bleeding esophageal ulcer, gastritis, and a normal duodenum.  Pathology revealed mild reactive gastropathy.  Colonoscopy on 06/20/2017 revealed non-bleeding external hemorrhoids, diverticulosis in the sigmoid colon, and one 1 mm polyp at the recto-sigmoid colon.  Pathology revealed a tubular adenoma  Negative for high grade dysplasia or malignancy.  Symptomatically, she feels "ok".  Patient has swelling and weeping to her bilateral lower extremities. Patient has chronic dysphagia. She is scheduled for an EUS in March.  Exam is stsble.  Hematocrit is 28.1 and hemoglobin 9.1. Platelets 313,000. Ferritin is 9.  Plan: 1.  Review labs from 11/19/2017.  Discuss drop in hematocrit.  Iron stores low (ferritin 9).  Ferritin goal 100.  Discuss reinstitution of IV iron. 2.  Venofer today and weekly x 3 (total of 4 infusions). 3.  EUS scheduled for 12/05/2017.  4.  RTC in 6 weeks for labs (CBC with diff, ferritin). 5.  RTC in 3 months for MD assessment and labs (CBC with diff, ferritin-[name] before) +/- Venofer.   Honor Loh, NP  11/21/2017, 2:08 PM   I saw and evaluated the patient, participating in the key portions of the service and reviewing pertinent diagnostic studies and records.  I reviewed the nurse practitioner's note and agree with the findings and the plan.  The assessment and plan were discussed with the patient.  A few questions were asked by the patient and answered.   Nolon Stalls, MD 11/21/2017,2:08 PM

## 2017-11-21 NOTE — Progress Notes (Signed)
Patient c/o of generalized itching all over her body. Itching is worse on the top of the head, back ears and groin. Scalp evaluate revealed flaky dry skin. Dry skin noted on back. No signs of rashes. Pt has not used any new lotions, shampoos or laundry detergent.  Also c/o lower extremity edema and redness in legs. Per patient, legs are weeping fluid. reviewed medication list - patient is uncertain if she is taking zoloft. She does not remember Dr. Ginette Pitman prescribing this medication and doesn't know why he would prescribe an antidepressant. She will review her med list and let us know if she is taking this.

## 2017-11-22 NOTE — Progress Notes (Signed)
EUS has been scheduled for 3/7 with Dr. Francella Solian at [place]. Went over instructions and written copy given to her. She was instructed to hold Plavix for 7 days before procedure. Take dose on 2/27 then stop. They will instruct her when to resume after her procedure is completed. All questions answered. She also had my contact information for any future questions. INSTRUCTIONS FOR ENDOSCOPIC ULTRASOUND -Your procedure has been scheduled for March 7th with Dr. Francella Solian at [facility]. -The hospital may contact you to pre-register over the phone.  -To get your scheduled arrival time, please call the Endoscopy unit at  [phone] between 1-3 p.m. on:  March 6th    -ON THE DAY OF YOU PROCEDURE:   1. If you are scheduled for a morning procedure, nothing to drink after midnight  -If you are scheduled for an afternoon procedure, you may have clear liquids until 5 hours prior  to the procedure but no carbonated drinks or broth  2. NO FOOD THE DAY OF YOUR PROCEDURE  3. You may take your heart, seizure, blood pressure, Parkinson's or breathing medications at  6am with just enough water to get your pills down  4. Do not take any oral Diabetic medications the morning of your procedure.  5. If you are a diabetic and are using insulin, please notify your prescribing physician of this  procedure as your dose may need to be altered related to not being able to eat or drink.   5. Do not take vitamins, iron, or fish oil for 5 days before your procedure     -On the day of your procedure, come to the [facility] Admitting/Registration desk (First desk on the right) at the scheduled arrival time. You MUST have someone drive you home from your procedure. You must have a responsible adult with a valid driver's license who is on site throughout your entire procedure and who can stay with you for several hours after your procedure. You may not go home alone in a taxi, shuttle [place] or bus, as the  drivers will not be responsible for you.  --If you have any questions please call me at the above contact    Oncology Nurse Navigator Documentation  Navigator Location: [facility] On[facility] (11/22/17 1300)   )Navigator Encounter Type: Telephone (11/22/17 1300)                                 Coordination of Care: EUS (11/22/17 1300)                  Time Spent with Patient: 30 (11/22/17 1300)

## 2017-11-25 ENCOUNTER — Other Ambulatory Visit: Payer: Self-pay

## 2017-12-04 ENCOUNTER — Encounter: Payer: Self-pay | Admitting: *Deleted

## 2017-12-05 ENCOUNTER — Ambulatory Visit: Payer: Medicare Other | Admitting: Anesthesiology

## 2017-12-05 ENCOUNTER — Encounter
Admission: RE | Disposition: A | Discharge status: Home / Self Care | Payer: Self-pay | Source: Ambulatory Visit | Attending: Gastroenterology

## 2017-12-05 ENCOUNTER — Encounter: Payer: Self-pay | Admitting: *Deleted

## 2017-12-05 ENCOUNTER — Ambulatory Visit
Admission: RE | Admit: 2017-12-05 | Discharge: 2017-12-05 | Disposition: A | Discharge status: Home / Self Care | Payer: Medicare Other | Source: Ambulatory Visit | Attending: Gastroenterology | Admitting: Gastroenterology

## 2017-12-05 DIAGNOSIS — K55059 Acute (reversible) ischemia of intestine, part and extent unspecified: Secondary | ICD-10-CM | POA: Insufficient documentation

## 2017-12-05 DIAGNOSIS — Z09 Encounter for follow-up examination after completed treatment for conditions other than malignant neoplasm: Secondary | ICD-10-CM | POA: Insufficient documentation

## 2017-12-05 DIAGNOSIS — I10 Essential (primary) hypertension: Secondary | ICD-10-CM | POA: Insufficient documentation

## 2017-12-05 DIAGNOSIS — K210 Gastro-esophageal reflux disease with esophagitis: Secondary | ICD-10-CM | POA: Diagnosis not present

## 2017-12-05 DIAGNOSIS — I083 Combined rheumatic disorders of mitral, aortic and tricuspid valves: Secondary | ICD-10-CM | POA: Diagnosis not present

## 2017-12-05 DIAGNOSIS — Z7984 Long term (current) use of oral hypoglycemic drugs: Secondary | ICD-10-CM | POA: Diagnosis not present

## 2017-12-05 DIAGNOSIS — Z95828 Presence of other vascular implants and grafts: Secondary | ICD-10-CM | POA: Diagnosis not present

## 2017-12-05 DIAGNOSIS — K222 Esophageal obstruction: Secondary | ICD-10-CM | POA: Insufficient documentation

## 2017-12-05 DIAGNOSIS — E1151 Type 2 diabetes mellitus with diabetic peripheral angiopathy without gangrene: Secondary | ICD-10-CM | POA: Insufficient documentation

## 2017-12-05 DIAGNOSIS — Z85828 Personal history of other malignant neoplasm of skin: Secondary | ICD-10-CM | POA: Diagnosis not present

## 2017-12-05 DIAGNOSIS — Z79899 Other long term (current) drug therapy: Secondary | ICD-10-CM | POA: Insufficient documentation

## 2017-12-05 DIAGNOSIS — Z7902 Long term (current) use of antithrombotics/antiplatelets: Secondary | ICD-10-CM | POA: Diagnosis not present

## 2017-12-05 HISTORY — DX: Acute (reversible) ischemia of intestine, part and extent unspecified: K55.059

## 2017-12-05 HISTORY — DX: Other nonrheumatic aortic valve disorders: I35.8

## 2017-12-05 HISTORY — DX: Pulmonary hypertension, unspecified: I27.20

## 2017-12-05 HISTORY — PX: UPPER ESOPHAGEAL ENDOSCOPIC ULTRASOUND (EUS): SHX6562

## 2017-12-05 HISTORY — DX: Unspecified osteoarthritis, unspecified site: M19.90

## 2017-12-05 LAB — GLUCOSE, CAPILLARY: Glucose-Capillary: 111 mg/dL — ABNORMAL HIGH (ref 65–99)

## 2017-12-05 SURGERY — UPPER ESOPHAGEAL ENDOSCOPIC ULTRASOUND (EUS)
Anesthesia: General

## 2017-12-05 MED ORDER — SODIUM CHLORIDE 0.9 % IV SOLN
INTRAVENOUS | Status: DC
  Administered 2017-12-05: 1000 mL via INTRAVENOUS

## 2017-12-05 MED ORDER — PROPOFOL 10 MG/ML IV BOLUS
INTRAVENOUS | Status: DC | PRN
  Administered 2017-12-05: 13 mg via INTRAVENOUS
  Administered 2017-12-05: 70 mg via INTRAVENOUS

## 2017-12-05 MED ORDER — LIDOCAINE HCL (PF) 2 % IJ SOLN
INTRAMUSCULAR | Status: AC
  Filled 2017-12-05: qty 10

## 2017-12-05 MED ORDER — PROPOFOL 500 MG/50ML IV EMUL
INTRAVENOUS | Status: DC | PRN
  Administered 2017-12-05: 130 ug/kg/min via INTRAVENOUS

## 2017-12-05 MED ORDER — PHENYLEPHRINE HCL 10 MG/ML IJ SOLN
INTRAMUSCULAR | Status: DC | PRN
  Administered 2017-12-05: 100 ug via INTRAVENOUS

## 2017-12-05 MED ORDER — PROPOFOL 500 MG/50ML IV EMUL
INTRAVENOUS | Status: AC
  Filled 2017-12-05: qty 50

## 2017-12-05 MED ORDER — LIDOCAINE HCL (CARDIAC) 20 MG/ML IV SOLN
INTRAVENOUS | Status: DC | PRN
  Administered 2017-12-05: 50 mg via INTRAVENOUS

## 2017-12-05 NOTE — Transfer of Care (Signed)
Immediate Anesthesia Transfer of Care Note  Patient: [name] Day  Procedure(s) Performed: UPPER ESOPHAGEAL ENDOSCOPIC ULTRASOUND (EUS) (N/A )  Patient Location: PACU and Endoscopy Unit  Anesthesia Type:General  Level of Consciousness: drowsy  Airway & Oxygen Therapy: Patient Spontanous Breathing and Patient connected to nasal cannula oxygen  Post-op Assessment: Report given to RN and Post -op Vital signs reviewed and stable  Post vital signs: Reviewed and stable  Last Vitals:  Vitals:   12/05/17 1245  BP: (!) 169/54  Pulse: 62  Resp: 20  Temp: (!) 35.6 C    Last Pain:  Vitals:   12/05/17 1245  TempSrc: Tympanic  PainSc: 7       Patients Stated Pain Goal: 0 (28/20/60 1561)  Complications: No apparent anesthesia complications

## 2017-12-05 NOTE — Interval H&P Note (Signed)
History and Physical Interval Note:  12/05/2017 12:59 PM  [name] Day  has presented today for surgery, with the diagnosis of ULCER OF ESOPHAGUS  The various methods of treatment have been discussed with the patient and family. After consideration of risks, benefits and other options for treatment, the patient has consented to  Procedure(s): UPPER ESOPHAGEAL ENDOSCOPIC ULTRASOUND (EUS) (N/A) as a surgical intervention .  The patient's history has been reviewed, patient examined, no change in status, stable for surgery.  I have reviewed the patient's chart and labs.  Questions were answered to the patient's satisfaction.  Still has some solid and liquid dysphagia. Mild epig pain. Abd soft and mild epig tenderness   Duvall Comes,  Sammuel Hines

## 2017-12-05 NOTE — Anesthesia Post-op Follow-up Note (Signed)
Anesthesia QCDR form completed.        

## 2017-12-05 NOTE — Anesthesia Preprocedure Evaluation (Signed)
Anesthesia Evaluation  Patient identified by MRN, date of birth, ID band Patient awake    Reviewed: Allergy & Precautions, NPO status , Patient's Chart, lab work & pertinent test results  History of Anesthesia Complications Negative for: history of anesthetic complications  Airway Mallampati: III  TM Distance: >3 FB Neck ROM: Full    Dental no notable dental hx.    Pulmonary neg pulmonary ROS, neg sleep apnea, neg COPD,    breath sounds clear to auscultation- rhonchi (-) wheezing      Cardiovascular hypertension, Pt. on medications + Peripheral Vascular Disease  (-) CAD, (-) Past MI and (-) Cardiac Stents  Rhythm:Regular Rate:Normal - Systolic murmurs and - Diastolic murmurs Echo 3/47/42: MILD LV SYSTOLIC DYSFUNCTION WITH MILD LVH NORMAL RIGHT VENTRICULAR SYSTOLIC FUNCTION MODERATE VALVULAR REGURGITATION Aortic: MILD AR Mitral: MODERATE MR Tricuspid: MODERATE TR Contraction: REGIONALLY IMPAIRED Closest EF: 45% (Estimated) Inferior: HYPOCONTRACTILE Lateral: HYPOCONTRACTILE Posterior: HYPOCONTRACTILE   Neuro/Psych negative neurological ROS  negative psych ROS   GI/Hepatic Neg liver ROS, PUD, GERD  ,  Endo/Other  diabetes, Oral Hypoglycemic Agents  Renal/GU Renal disease: hx of nephrolithiasis.     Musculoskeletal negative musculoskeletal ROS (+)   Abdominal (+) - obese,   Peds  Hematology  (+) anemia ,   Anesthesia Other Findings Past Medical History: No date: Anemia No date: Arrhythmia No date: Basal cell carcinoma of skin No date: Brain tumor ([place]) No date: Brain tumor ([place]) No date: Cervical spine disease No date: Diabetes mellitus without complication ([facility]) No date: Esophageal ulcer without bleeding No date: GERD (gastroesophageal reflux disease) No date: Hyperlipemia No date: Hypertension No date: Kidney stones No date: Leaky heart valve No date: Meningioma ([place])   Reproductive/Obstetrics                             Anesthesia Physical  Anesthesia Plan  ASA: III  Anesthesia Plan: General   Post-op Pain Management:    Induction: Intravenous  PONV Risk Score and Plan: 2 and Propofol infusion  Airway Management Planned: Natural Airway  Additional Equipment:   Intra-op Plan:   Post-operative Plan:   Informed Consent: I have reviewed the patients History and Physical, chart, labs and discussed the procedure including the risks, benefits and alternatives for the proposed anesthesia with the patient or authorized representative who has indicated his/her understanding and acceptance.   Dental advisory given  Plan Discussed with: CRNA and Anesthesiologist  Anesthesia Plan Comments:         Anesthesia Quick Evaluation

## 2017-12-05 NOTE — Op Note (Addendum)
[facility] Gastroenterology Patient Name: [name] Day Procedure Date: 12/05/2017 1:09 PM MRN: [id number] Account #: [id number] Date of Birth: [birth year] Admit Type: Outpatient Age: 84 Room: [facility] ENDO ROOM 3 Gender: Female Note Status: Finalized Procedure:            Upper EUS Indications:          Esophageal deformity on endoscopy/Subepithelial tumor                        vs. extrinsic compression, Dysphagia Providers:            Zada Girt Referring MD:         [name], MD (Referring MD), Lollie Sails, MD (Referring MD) Medicines:            Monitored Anesthesia Care Complications:        No immediate complications. Procedure:            Pre-Anesthesia Assessment:                       - Please see pre-anesthesia assessment documentation                        already completed in Epic.                       After obtaining informed consent, the endoscope was                        passed under direct vision. Throughout the procedure,                        the patient's blood pressure, pulse, and oxygen                        saturations were monitored continuously. The upper                        endoscope and the EUS GI Radial Array [id number] was                        introduced through the mouth, and advanced to the lower                        third of esophagus. The upper EUS was accomplished                        without difficulty. The patient tolerated the procedure                        well. Findings:      Endoscopic Finding :      One severe (stenosis; an endoscope cannot pass) stenosis was found 39 cm       from the incisors. And was not traversed. As examined from the proximal       edge, the mucosa was erythematous but not frankly ulcerated. Biopsies       were taken with a cold forceps for histology. The area felt firm.      Endosonographic  Finding :      Diffuse wall thickening was visualized  endosonographically in the lower       third of the esophagus. The EUS scope could not be advanced into the       stenosis so limited exam from the proximal edge was obtained. Loss of       normal wall layers and diffusely hypoechoic measuring from 3 mm to 6 mm       in thickness. The more proximal esophageal wall appeared normal. No       frankly extrinsic lesion seen.      No abnormal-appearing lymph nodes were seen during endosonographic       examination in the subcarinal mediastinum (level 7), in the middle       paraesophageal region (level 78M) and in the lower paraesophageal region       (level 8L). Impression:           - Esophageal stenosis. Biopsied.                       - The EUS scope could not be advanced into the stenosis                        as it was too tight, so limited exam from the proximal                        edge was obtained. Loss of normal wall layers and                        diffusely hypoechoic measuring from 3 mm to 6 mm in                        thickness. This could be inflammatory in nature but                        malignancy is also of concern. No extrinsic mass could                        be seen but exam was limited due to inability to pass                        the EUS scope through the stenosis. Recommendation:       - Await path results.                       - Return to referring physician.                       - If pathology is non-diagnostic, recommend consider                        chest CT as the last chest CT was July 2018 - discussed                        with Dr. Colman Cater.                       - The findings and recommendations were discussed with  the patient.                       - The findings and recommendations were discussed with                        the designated responsible adult.                       - The findings and recommendations were discussed with                        the referring  physician. Procedure Code(s):    --- Professional ---                       [phone], Esophagoscopy, flexible, transoral; with                        endoscopic ultrasound examination                       [id number], Esophagoscopy, flexible, transoral; with biopsy,                        single or multiple CPT copyright 2016 American Medical Association. All rights reserved. The codes documented in this report are preliminary and upon coder review may  be revised to meet current compliance requirements. Zada Girt,  12/05/2017 2:11:59 PM This report has been signed electronically. Number of Addenda: 0 Note Initiated On: 12/05/2017 1:09 PM      [facility]

## 2017-12-05 NOTE — Anesthesia Postprocedure Evaluation (Signed)
Anesthesia Post Note  Patient: [name] Day  Procedure(s) Performed: UPPER ESOPHAGEAL ENDOSCOPIC ULTRASOUND (EUS) (N/A )  Patient location during evaluation: Endoscopy Anesthesia Type: General Level of consciousness: awake and alert and oriented Pain management: pain level controlled Vital Signs Assessment: post-procedure vital signs reviewed and stable Respiratory status: spontaneous breathing, nonlabored ventilation and respiratory function stable Cardiovascular status: blood pressure returned to baseline and stable Postop Assessment: no signs of nausea or vomiting Anesthetic complications: no     Last Vitals:  Vitals:   12/05/17 1408 12/05/17 1418  BP: (!) 161/51 (!) 162/56  Pulse:    Resp:    Temp:      Last Pain:  Vitals:   12/05/17 1348  TempSrc: Tympanic  PainSc:                  Pasha Gadison

## 2017-12-06 ENCOUNTER — Encounter: Payer: Self-pay | Admitting: Gastroenterology

## 2017-12-10 ENCOUNTER — Telehealth: Payer: Self-pay

## 2017-12-10 LAB — SURGICAL PATHOLOGY

## 2017-12-10 NOTE — Telephone Encounter (Signed)
Called and notified Teena Dunk at [facility] of pathology results from EUS.   DIAGNOSIS:  A. ESOPHAGUS, 39 CM; COLD BIOPSY:  - MARKEDLY INFLAMED FIBROMUSCULAR TISSUE WITH CMV VIRAL CYTOPATHIC  EFFECT, CONSISTENT WITH CMV ESOPHAGITIS.  - DETACHED FRAGMENT OF FIBRINOPURULENT MATERIAL  - DETACHED SUPERFICIAL STRIPS OF SQUAMOUS EPITHELIUM WITH ACUTE  INFLAMMATION.  - NEGATIVE FOR MALIGNANCY.     Oncology Nurse Navigator Documentation  Navigator Location: [facility] (12/10/17 1300)   )Navigator Encounter Type: Telephone;Diagnostic Results (12/10/17 1300)                                                    Time Spent with Patient: 15 (12/10/17 1300)

## 2017-12-31 ENCOUNTER — Encounter: Payer: Self-pay | Admitting: Infectious Diseases

## 2017-12-31 ENCOUNTER — Ambulatory Visit: Payer: Medicare Other | Admitting: Infectious Diseases

## 2017-12-31 ENCOUNTER — Other Ambulatory Visit: Payer: Self-pay | Admitting: Hematology and Oncology

## 2017-12-31 VITALS — BP 142/48 | HR 72 | Temp 97.6°F | Wt 116.0 lb

## 2017-12-31 DIAGNOSIS — B258 Other cytomegaloviral diseases: Principal | ICD-10-CM

## 2017-12-31 DIAGNOSIS — Z5181 Encounter for therapeutic drug level monitoring: Secondary | ICD-10-CM

## 2017-12-31 DIAGNOSIS — K208 Other esophagitis without bleeding: Secondary | ICD-10-CM

## 2017-12-31 DIAGNOSIS — B259 Cytomegaloviral disease, unspecified: Secondary | ICD-10-CM

## 2017-12-31 DIAGNOSIS — L299 Pruritus, unspecified: Secondary | ICD-10-CM

## 2017-12-31 MED ORDER — VALGANCICLOVIR HCL 50 MG/ML PO SOLR: 450.0000 mg | Freq: Every day | ORAL | 0 refills | Status: DC

## 2017-12-31 NOTE — Progress Notes (Signed)
Patient: [name] Day  DOB: [birth year] MRN: [id number] PCP: [name], MD  Referring Provider: Dr Earmon Phoenix / PA Jacqulyn Liner (GI team @ [facility])  Patient Active Problem List   Diagnosis Date Noted  . Itching 01/08/2018  . Medication monitoring encounter 01/08/2018  . 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 Hypertension 04/30/2017  . Mesenteric ischemia ([place]) 03/01/2017  . Type 2 diabetes mellitus with complication ([place]) 87/56/4332  . Iron deficiency anemia 02/28/2017  . PAD (peripheral artery disease) ([place]) 02/05/2017     Subjective:  No chief complaint on file.  [name] is an 84 y.o. F with health history detailed below but significant for weight loss, dysphagia/odonyophagia and recently evidence of CMV infection on recent endoscopy.   She and her daughter provide the history today. Symptoms began about 18 months ago where she began having trouble with swallowing due to pain. She has modified her eating behaviors to try to accommodate for this by taking in only a very small bite of food at a time to try to get something to pass. Even still she has pain down in the center of her chest where it is difficult to get food to pass and feels it "get hung up" to where she needs to wait for a while until it moves down into her stomach. Her meals are very small and it takes her about 90 minutes to finish a meal. Often she will blend foods to help get things down. Associated symptoms include frequent vomiting and weight loss - she has lost at least 50 lbs in the time she has had difficulty with swallowing. She does not have any fevers/chills.   Per chart records she has had 4 endoscopies since 03/2017:   03/2017 EGD - A moderate-sized area of extrinsic compression was found in the mid esophagus from about 25-28 cm, no apparent defect or abnormality in the mucosa or esophageal wall. One cratered esophageal ulcer was found; about 2 cm in size (longitudinally) in largest dimension.  Segmental moderate mucosal changes characterized by granularity were found at the gastroesophageal junction. Localized mild inflammation characterized by congestion (edema), depression and erythema was found on the greater curvature of the gastric body.    06/2017 - One linear esophageal ulcer was found 37 cm from the incisors. The lesion was about 4-5 mm in length paralell to the lumen in largest dimension. There are no overhanging edges. Biopsies taken from the center and edge of the lesion. The lesion appears improved in appearance from previous. The GE junction shows a edema though no ulceration, biopsies. Some tortuousity noted in the distal esophagus, though no overt stenosis. PATH: mild reactive gastropathy, negative for acute inflammation, metaplasia/dysplasia or malignancy. Neg H Pylori. All ulcerated samples negative for viral cytopathic effect. PATH: H Pylori neg. No active gastric inflammation. Chronic inflammation with eosinophils of GE junction; esoph ulcer negative fungal or viral effects, dysplasia or malignancy.    10/21/17 - A moderate-sized area of extrinsic compression was found in the mid esophagus at about 34-35 cm from the alveolar ridge. Multiple sharp turns are noted in the esophagus starting at about 37 cm, with ulceration noted from about 38 cm to the GE junction. Multiple biopsies taken with a cold forcep. This area is very firm to biopsy. There was a mild ring at the GE junction with evidence of esophagitis, this was opened/dilated with disruption of the ring noted. with passage of the scope, as well as an area about 2 cm proximal  to this. Diffuse mild inflammation characterized by congestion (edema) and erythema was found in the gastric body and in the gastric antrum. Helicobacter pylori testing negative. One non-bleeding linear gastric ulcer, linear white base, with no stigmata of bleeding. PATH: negative H Pylori; edema and foveolar hyperplasia at GE junction; acute esophagitis  with ulcerations. CMV stain negative.    12/05/2017 - One severe (stenosis; an endoscope cannot pass) stenosis was found 39 cm from the incisors. And was not traversed. As examined from the proximal edge, the mucosa was erythematous but not frankly ulcerated. Biopsies were taken with a cold forceps for histology. The area felt firm. Diffuse wall thickening was visualized endosonographically in the lower third of the esophagus. The EUS scope could not be advanced into the stenosis so limited exam from the proximal edge was obtained. Loss of normal wall layers and diffusely hypoechoic measuring from 3 mm to 6 mm in thickness. The more proximal esophageal wall appeared normal. No frankly extrinsic lesion seen. PATH: markedly inflammed fibromuscular tissue with CMV viral cytopathic effect c/w CMV esophagitis. HSV I/II neg.   Review of Systems  Constitutional: Positive for malaise/fatigue and weight loss. Negative for chills and fever.  HENT: Negative for tinnitus.   Eyes: Negative for blurred vision and photophobia.  Respiratory: Negative for cough and sputum production.   Cardiovascular: Negative for chest pain.  Gastrointestinal: Negative for diarrhea, nausea and vomiting.  Genitourinary: Negative for dysuria.  Skin: Positive for itching and rash.  Neurological: Negative for headaches.    Past Medical History:  Diagnosis Date  . Anemia   . Aortic valve sclerosis   . Arrhythmia   . Arthritis    osteoarthritis  . Basal cell carcinoma of skin   . Brain tumor ([place])   . Brain tumor ([place])   . Cervical spine disease   . Diabetes mellitus without complication ([place])   . Esophageal ulcer without bleeding   . GERD (gastroesophageal reflux disease)   . Hyperlipemia   . Hypertension   . Kidney stones   . Leaky heart valve   . Meningioma ([place])   . Occlusive mesenteric ischemia ([place])   . Pulmonary hypertension ([place])     Outpatient Medications Prior to Visit  Medication Sig Dispense Refill  .  acetaminophen (TYLENOL) 650 MG CR tablet Take 650 mg by mouth every 8 (eight) hours as needed for pain.    Marland Kitchen allopurinol (ZYLOPRIM) 100 MG tablet Take 100 mg by mouth at bedtime.   0  . Bioflavonoid Products (VITAMIN C) CHEW Chew 2 tablets by mouth daily.    . clopidogrel (PLAVIX) 75 MG tablet take 1 tablet by mouth once daily 30 tablet 11  . dicyclomine (BENTYL) 10 MG capsule Take 10 mg by mouth 3 (three) times daily.    . furosemide (LASIX) 40 MG tablet Take 40 mg by mouth 2 (two) times daily.   0  . glipiZIDE (GLUCOTROL) 5 MG tablet Take 5 mg by mouth daily before breakfast.    . hydrALAZINE (APRESOLINE) 50 MG tablet Take 50 mg by mouth 2 (two) times daily.     . hydrochlorothiazide (HYDRODIURIL) 25 MG tablet Take 25 mg by mouth daily.  0  . Hypromellose (GENTEAL MILD) 0.2 % SOLN Place 1 drop into both eyes 3 (three) times daily.    Marland Kitchen lisinopril (PRINIVIL,ZESTRIL) 40 MG tablet Take 40 mg by mouth daily.   0  . metoprolol succinate (TOPROL-XL) 50 MG 24 hr tablet Take 75 mg by mouth 2 (two)  times daily.   0  . pantoprazole (PROTONIX) 40 MG tablet Take 40 mg by mouth 2 (two) times daily.    Marland Kitchen rOPINIRole (REQUIP) 2 MG tablet Take 2 mg by mouth 2 (two) times daily.   0  . sertraline (ZOLOFT) 25 MG tablet Take 25 mg by mouth daily.     . sucralfate (CARAFATE) 1 g tablet Take 1 g by mouth 3 (three) times daily as needed (for difficulty swallowing).   0  . vitamin B-12 (CYANOCOBALAMIN) 1000 MCG tablet Take 1,000 mcg by mouth daily.     No facility-administered medications prior to visit.      Allergies  Allergen Reactions  . Gabapentin Swelling  . Lipitor [Atorvastatin] Other (See Comments)    Muscle aches  . Mevacor [Lovastatin] Other (See Comments)    Muscle aches  . Milk-Related Compounds     Large quantities cause headaches   . Septra [Sulfamethoxazole-Trimethoprim]   . Statins Other (See Comments)    Muscle pain  . Sulfur Diarrhea and Nausea And Vomiting  . Zocor [Simvastatin] Other  (See Comments)    Muscle aches    Social History   Tobacco Use  . Smoking status: Never Smoker  . Smokeless tobacco: Never Used  Substance Use Topics  . Alcohol use: No  . Drug use: No    Family History  Problem Relation Age of Onset  . Stroke Mother   . Hypertension Mother   . Cancer Father   . Heart attack Sister   . Diabetes Sister   . Heart attack Brother     Objective:   Vitals:   12/31/17 1008  BP: (!) 142/48  Pulse: 72  Temp: 97.6 F (36.4 C)  TempSrc: Oral  Weight: 116 lb (52.6 kg)   Body mass index is 19.91 kg/m.  Physical Exam  Constitutional: She is oriented to person, place, and time. She appears malnourished.  HENT:  Mouth/Throat: Oropharynx is clear and moist. No oral lesions. No dental abscesses.  No ulcerations noted in mouth  Eyes: No scleral icterus.  Cardiovascular: Normal rate, regular rhythm and intact distal pulses.  Murmur heard. Pulmonary/Chest: Effort normal and breath sounds normal. No respiratory distress. She has no rales.  Abdominal: Soft. She exhibits no distension and no mass. There is tenderness (epigastric).  Musculoskeletal: Normal range of motion. She exhibits no edema or tenderness.  Lymphadenopathy:    She has no cervical adenopathy.  Neurological: She is alert and oriented to person, place, and time.  Skin: Skin is warm and dry. No rash noted.  Psychiatric: Mood, affect and judgment normal.    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 Also with large area of stenosis noted, now is unable to pass scope described to be "firm." I am not certain that these scattered findings on stain are causing all of her symptoms. She has had significant progression of this stenotic area in her distal esophagus that is well described reading through the four EGD procedures.   I discussed my skepticism with [name] and her daughter [name] today and worry that this is not the primary driver of her symptoms. However considering she has had significant weight loss we decided to trial her on liquid valganciclovir 450 mg QD (CC < 39mL) for 28 days to see if there is any improvement in her symptoms. Tablet is coated and unable to be crushed and only other alternative is IV ganciclovir which she would prefer to not use.   She will return in 1 week after starting medication to reassess. I called and discussed with Ms. Jacqulyn Liner, NP at GI clinic to share our plan and my reservations. Considering now inability to pass scope I think GI should still check CT scan of chest/abdomen despite this finding on her pathology. I ultimately worry that she may require percutaneous tube to bypass the stenotic area.   Will check serum quant CMV PCR to see if this would be useful in monitoring. If EGD is repeated in the future would send tissue of ulcerated area for PCR testing to detect CMV DNA specifically vs stain.      Relevant Medications   valganciclovir (VALCYTE) 50 MG/ML SOLR     Other   Itching    No lesions or rashes on her upper back aside from scratches c/w fingernail marks present with dry, flaking skin. Recommended Cetaphil / Eucerin cream with OTC hydrocortisone BID to see if this improves her symptoms.       Medication monitoring encounter    I will reach out to her  hematology team @ [facility] to see if they can assist with weekly CBCs to monitor for myelosuppressive s/e that is associated with valganciclovir. This would be helpful for her travel burden. She is already fairly anemic and requires Fe transfusions routinely and I worry about her ability to tolerate this medication.          Janene Madeira, MSN, NP-C [facility] [place] Pager: [phone] Office: [phone]

## 2017-12-31 NOTE — Patient Instructions (Addendum)
The new medication we would like to start for your cytomegalovirus infection is called Valcyte.   Will try to send in liquid version - please take 9 mL once daily with the biggest meal. We will need to monitor some blood work while you are on this medication.   Next appointment is next week on 4/10 at 4:15 pm   For your rash - would try some hydrocortisone cream to the itchy areas twice a day for about a week to see if this helps. Otherwise would do a good lotion like Cetaphil or Eucerin to help with moisturizing.

## 2018-01-01 ENCOUNTER — Telehealth: Payer: Self-pay | Admitting: Infectious Disease

## 2018-01-01 DIAGNOSIS — K208 Other esophagitis without bleeding: Secondary | ICD-10-CM | POA: Insufficient documentation

## 2018-01-01 DIAGNOSIS — B259 Cytomegaloviral disease, unspecified: Secondary | ICD-10-CM | POA: Insufficient documentation

## 2018-01-01 DIAGNOSIS — B258 Other cytomegaloviral diseases: Principal | ICD-10-CM

## 2018-01-01 NOTE — Telephone Encounter (Signed)
I received a call via the operator for this patient who has CMV esophagitis.  If I understand her correctly #1 they are unable to find a pharmacy that has her liquid bowel ganciclovir  2.  The medication will not be covered by her insurance and will cost her $4000.  We investigate some means of getting her this drug or perhaps a more appropriate route would be to have her admitted to the hospital and placed on IV ganciclovir so that her dysphagia can improve to the point where oral tablets can be taken?  I am see seeing Minh and triage line and Dr. Johnnye Sima since I am going out of town in next 5 minutes

## 2018-01-01 NOTE — Telephone Encounter (Signed)
valgancyclovir would be a reasonable po option for CMV.  It comes as a liquid formulation.

## 2018-01-01 NOTE — Telephone Encounter (Signed)
Patient daughter called to advise that the patient received a call from Dr Tommy Medal and was told the the medication sent in yesterday was for IV admin, which upset her mother. They thought it was oral and are confused. She also advised the pharmacy was unable to get the medication and will not order it as it is to expensive. They had the Rx transferred [facility] a [facility] and were told it going to cost $4K to get the liquid formulation she can not afford it if that is the case. Advised not sure what Colletta [place] can do but will let her know and give them a call back once she responds.

## 2018-01-01 NOTE — Telephone Encounter (Signed)
Per Colletta [place] response called the patient daughter and advised she is out of the office but plans to contact them tomorrow to discuss the case further and answer any questions they may have. She was fine with this and advised best contact number is cell [phone]

## 2018-01-01 NOTE — Telephone Encounter (Signed)
After meeting with her yesterday and looking at her last several EGDs she has a severe stricture/stenosis and this is the first time there has been any mentioning of CMV on pathology. Was not a diffuse finding from what I understood on the path report either; I wasn't very confident that this would be the end all for her symptoms. We did not talk about IV treatment at our visit but I can call them tomorrow to discuss this as an alternative when I get back in the office.

## 2018-01-02 ENCOUNTER — Inpatient Hospital Stay: Payer: Medicare Other | Attending: Hematology and Oncology

## 2018-01-02 DIAGNOSIS — K208 Other esophagitis without bleeding: Secondary | ICD-10-CM

## 2018-01-02 DIAGNOSIS — D509 Iron deficiency anemia, unspecified: Secondary | ICD-10-CM | POA: Diagnosis not present

## 2018-01-02 DIAGNOSIS — B259 Cytomegaloviral disease, unspecified: Secondary | ICD-10-CM

## 2018-01-02 DIAGNOSIS — B258 Other cytomegaloviral diseases: Secondary | ICD-10-CM

## 2018-01-02 DIAGNOSIS — D508 Other iron deficiency anemias: Secondary | ICD-10-CM

## 2018-01-02 LAB — CBC WITH DIFFERENTIAL/PLATELET
Basophils Absolute: 0.1 10*3/uL (ref 0–0.1)
Basophils Relative: 1 %
Eosinophils Absolute: 0.3 10*3/uL (ref 0–0.7)
Eosinophils Relative: 4 %
HCT: 29 % — ABNORMAL LOW (ref 35.0–47.0)
Hemoglobin: 9.6 g/dL — ABNORMAL LOW (ref 12.0–16.0)
Lymphocytes Relative: 18 %
Lymphs Abs: 1.1 10*3/uL (ref 1.0–3.6)
MCH: 27.2 pg (ref 26.0–34.0)
MCHC: 33.1 g/dL (ref 32.0–36.0)
MCV: 82.1 fL (ref 80.0–100.0)
Monocytes Absolute: 0.5 10*3/uL (ref 0.2–0.9)
Monocytes Relative: 7 %
Neutro Abs: 4.5 10*3/uL (ref 1.4–6.5)
Neutrophils Relative %: 70 %
Platelets: 345 10*3/uL (ref 150–440)
RBC: 3.54 MIL/uL — ABNORMAL LOW (ref 3.80–5.20)
RDW: 15.7 % — ABNORMAL HIGH (ref 11.5–14.5)
WBC: 6.4 10*3/uL (ref 3.6–11.0)

## 2018-01-02 LAB — FERRITIN: Ferritin: 13 ng/mL (ref 11–307)

## 2018-01-02 MED ORDER — VALGANCICLOVIR HCL 50 MG/ML PO SOLR: 450.0000 mg | Freq: Every day | ORAL | 0 refills | Status: DC

## 2018-01-02 NOTE — Telephone Encounter (Signed)
Submit her patient assistance directly to [facility] for Valcyte suspension. Case# [id number]

## 2018-01-03 ENCOUNTER — Telehealth: Payer: Self-pay | Admitting: *Deleted

## 2018-01-03 NOTE — Telephone Encounter (Signed)
Called and made patient aware of her appts for lab/Venofer  Starting on 01/09/18. Also a reminder letter will be mailed out.

## 2018-01-04 LAB — CMV DNA, QUANTITATIVE, PCR
CMV DNA Quant: POSITIVE IU/mL
Log10 CMV Qn DNA Pl: UNDETERMINED log10 IU/mL

## 2018-01-07 NOTE — Telephone Encounter (Signed)
Voicemail transferred today to Triage from daughter, unsure when she left it, reporting issues obtaining original prescription. RN spoke with patient, confirmed she received [facility] procured for her. She states it has helped quite a bit, reports only 1 minor headache and 1 episode of diarrhea. Patient scheduled to follow up 4/10 with [facility].

## 2018-01-08 ENCOUNTER — Ambulatory Visit: Payer: Medicare Other | Admitting: Infectious Diseases

## 2018-01-08 ENCOUNTER — Encounter: Payer: Self-pay | Admitting: Infectious Diseases

## 2018-01-08 DIAGNOSIS — L03039 Cellulitis of unspecified toe: Secondary | ICD-10-CM | POA: Insufficient documentation

## 2018-01-08 DIAGNOSIS — B259 Cytomegaloviral disease, unspecified: Secondary | ICD-10-CM

## 2018-01-08 DIAGNOSIS — Z5181 Encounter for therapeutic drug level monitoring: Secondary | ICD-10-CM | POA: Diagnosis not present

## 2018-01-08 DIAGNOSIS — K208 Other esophagitis without bleeding: Secondary | ICD-10-CM

## 2018-01-08 DIAGNOSIS — B258 Other cytomegaloviral diseases: Secondary | ICD-10-CM

## 2018-01-08 DIAGNOSIS — L299 Pruritus, unspecified: Secondary | ICD-10-CM | POA: Insufficient documentation

## 2018-01-08 DIAGNOSIS — L03031 Cellulitis of right toe: Secondary | ICD-10-CM

## 2018-01-08 MED ORDER — CEPHALEXIN 500 MG PO CAPS: 500.0000 mg | ORAL_CAPSULE | Freq: Two times a day (BID) | ORAL | 0 refills | Status: AC

## 2018-01-08 NOTE — Assessment & Plan Note (Signed)
I will reach out to her hematology team @ [facility] to see if they can assist with weekly CBCs to monitor for myelosuppressive s/e that is associated with valganciclovir. This would be helpful for her travel burden. She is already fairly anemic and requires Fe transfusions routinely and I worry about her ability to tolerate this medication.

## 2018-01-08 NOTE — Assessment & Plan Note (Signed)
CBC tomorrow after about a week of therapy at her hematology appointment.

## 2018-01-08 NOTE — Progress Notes (Signed)
Patient: [name] Day  DOB: July 07, 1929 MRN: [id number] PCP: [name], MD  Referring Provider: Dr Earmon Phoenix / PA Jacqulyn Liner (GI team @ [facility])  Patient Active Problem List   Diagnosis Date Noted  . Itching 01/08/2018  . Medication monitoring encounter 01/08/2018  . Paronychia and onychomycosis great toe, right  01/08/2018  . 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 Hypertension 04/30/2017  . Mesenteric ischemia ([place]) 03/01/2017  . Type 2 diabetes mellitus with complication ([place]) 67/20/9470  . Iron deficiency anemia 02/28/2017  . PAD (peripheral artery disease) ([place]) 02/05/2017     Subjective:   Chief Complaint  Patient presents with  . Follow-up    CMV esophagitis, valcyte  . Toe Pain   [name] is an 84 y.o. F with health history detailed below but significant for weight loss, dysphagia/odonyophagia and recently evidence of CMV infection on recent endoscopy. She is here today for scheduled follow up and requesting evaluation of her right great toe.   Regarding her CMV esophagitis she has been on valganciclovir suspension now for 5 days and reports that she has already noticed some improvement. She can swallow easier without much pain and able to tolerate foods she has not been able to in a while. Still needs to crush her pills because that offers her the most pain. She has for the first time in a while stayed stable with her weight and she is very happy about this. She has noticed a few headaches however these are not too bad and respond to lying down/rest.  A few days ago she dropped a heavy item on the right great toe nailbed. Since that time she has had some significant redness and swelling to this area with a little drainage near the cuticle. No fevers noticed. She has significant pain when this area is touched. Has been soaking this in Epsom salts and using Anbesol to the area for the pain (which has helped). Going to see a podiatrist in a few days  hopefully but has not heard back about referral yet.   Review of Systems  Constitutional: Negative for chills, fever, malaise/fatigue and weight loss.  HENT: Negative for tinnitus.   Eyes: Negative for blurred vision and photophobia.  Respiratory: Negative for cough and sputum production.   Cardiovascular: Negative for chest pain.  Gastrointestinal: Positive for diarrhea (once with new medication ). Negative for abdominal pain, heartburn, nausea and vomiting.       Improved swallowing but still with dysphagia  Genitourinary: Negative for dysuria.  Musculoskeletal: Positive for joint pain (toe pain right ).  Skin: Negative for itching and rash.  Neurological: Positive for headaches.    Past Medical History:  Diagnosis Date  . Anemia   . Aortic valve sclerosis   . Arrhythmia   . Arthritis    osteoarthritis  . Basal cell carcinoma of skin   . Brain tumor ([place])   . Brain tumor ([place])   . Cervical spine disease   . Diabetes mellitus without complication ([place])   . Esophageal ulcer without bleeding   . GERD (gastroesophageal reflux disease)   . Hyperlipemia   . Hypertension   . Kidney stones   . Leaky heart valve   . Meningioma ([place])   . Occlusive mesenteric ischemia ([place])   . Pulmonary hypertension ([place])     Outpatient Medications Prior to Visit  Medication Sig Dispense Refill  . acetaminophen (TYLENOL) 650 MG CR tablet Take 650 mg by mouth every 8 (eight) hours  as needed for pain.    Marland Kitchen allopurinol (ZYLOPRIM) 100 MG tablet Take 100 mg by mouth at bedtime.   0  . Bioflavonoid Products (VITAMIN C) CHEW Chew 2 tablets by mouth daily.    . clopidogrel (PLAVIX) 75 MG tablet take 1 tablet by mouth once daily 30 tablet 11  . dicyclomine (BENTYL) 10 MG capsule Take 10 mg by mouth 3 (three) times daily.    . furosemide (LASIX) 40 MG tablet Take 40 mg by mouth 2 (two) times daily.   0  . glipiZIDE (GLUCOTROL) 5 MG tablet Take 5 mg by mouth daily before breakfast.    . hydrALAZINE  (APRESOLINE) 50 MG tablet Take 50 mg by mouth 2 (two) times daily.     . hydrochlorothiazide (HYDRODIURIL) 25 MG tablet Take 25 mg by mouth daily.  0  . Hypromellose (GENTEAL MILD) 0.2 % SOLN Place 1 drop into both eyes 3 (three) times daily.    Marland Kitchen lisinopril (PRINIVIL,ZESTRIL) 40 MG tablet Take 40 mg by mouth daily.   0  . metoprolol succinate (TOPROL-XL) 50 MG 24 hr tablet Take 75 mg by mouth 2 (two) times daily.   0  . pantoprazole (PROTONIX) 40 MG tablet Take 40 mg by mouth 2 (two) times daily.    Marland Kitchen rOPINIRole (REQUIP) 2 MG tablet Take 2 mg by mouth 2 (two) times daily.   0  . sucralfate (CARAFATE) 1 g tablet Take 1 g by mouth 3 (three) times daily as needed (for difficulty swallowing).   0  . valganciclovir (VALCYTE) 50 MG/ML SOLR Take 9 mLs (450 mg total) by mouth daily for 28 days. 252 mL 0  . vitamin B-12 (CYANOCOBALAMIN) 1000 MCG tablet Take 1,000 mcg by mouth daily.    . sertraline (ZOLOFT) 25 MG tablet Take 25 mg by mouth daily.      No facility-administered medications prior to visit.      Allergies  Allergen Reactions  . Gabapentin Swelling  . Lipitor [Atorvastatin] Other (See Comments)    Muscle aches  . Mevacor [Lovastatin] Other (See Comments)    Muscle aches  . Milk-Related Compounds     Large quantities cause headaches   . Septra [Sulfamethoxazole-Trimethoprim]   . Statins Other (See Comments)    Muscle pain  . Sulfur Diarrhea and Nausea And Vomiting  . Zocor [Simvastatin] Other (See Comments)    Muscle aches    Social History   Tobacco Use  . Smoking status: Never Smoker  . Smokeless tobacco: Never Used  Substance Use Topics  . Alcohol use: No  . Drug use: No    Family History  Problem Relation Age of Onset  . Stroke Mother   . Hypertension Mother   . Cancer Father   . Heart attack Sister   . Diabetes Sister   . Heart attack Brother     Objective:   Vitals:   01/08/18 1611  Weight: 116 lb (52.6 kg)  Height: 5\' 3"  (1.6 m)   Body mass index  is 20.55 kg/m.  Physical Exam  Constitutional: She is oriented to person, place, and time. She appears malnourished.  Pleasant thin elderly woman seated comfortably in the chair today. Her daughter [name] accompanies her.   HENT:  Mouth/Throat: Oropharynx is clear and moist. No oral lesions. No dental abscesses.  No ulcerations noted in mouth  Eyes: No scleral icterus.  Cardiovascular: Normal rate, regular rhythm and intact distal pulses.  Murmur heard. Pulmonary/Chest: Effort normal and breath sounds normal.  No respiratory distress. She has no rales.  Abdominal: Soft. She exhibits no distension and no mass. There is tenderness (epigastric).  Musculoskeletal: Normal range of motion. She exhibits no edema or tenderness.       Feet:  Lymphadenopathy:    She has no cervical adenopathy.  Neurological: She is alert and oriented to person, place, and time.  Skin: Skin is warm and dry. No rash noted.  Psychiatric: Mood, affect and judgment normal.  Vitals reviewed.   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 Will continue valganciclovir x 28 days. She received patient assistance for this course of treatment and was not required to pay anything up front which they appreciate greatly.   Will return in 3 weeks to reassess at the end of her treatment. Recommended treatment course usually between 3-6  weeks. Considering she is not immunosuppressed will do 4 weeks. Check PCR again at the end of treatment. Again considering she is immunocompetent I do not think she will need secondary prophylaxis. May consider sending Quantiferon CMV assay if we need help to determine this if we have concerns in the future.         Relevant Medications   cephALEXin (KEFLEX) 500 MG capsule     Musculoskeletal and Integument   Paronychia and onychomycosis great toe, right     Secondary to trauma and long-standing fungal infection of this nail. Will give her 7 days of keflex 500 mg BID. Advised to continue antibacterial soaks - betadine if possible. Follow up with podiatry as her nail likely needs to be removed.        Relevant Medications   cephALEXin (KEFLEX) 500 MG capsule     Other   Medication monitoring encounter    CBC tomorrow after about a week of therapy at her hematology appointment.          Janene Madeira, MSN, NP-C [facility] for Infectious [place] Pager: [phone] Office: [phone]

## 2018-01-08 NOTE — Assessment & Plan Note (Signed)
No lesions or rashes on her upper back aside from scratches c/w fingernail marks present with dry, flaking skin. Recommended Cetaphil / Eucerin cream with OTC hydrocortisone BID to see if this improves her symptoms.

## 2018-01-08 NOTE — Assessment & Plan Note (Signed)
Secondary to trauma and long-standing fungal infection of this nail. Will give her 7 days of keflex 500 mg BID. Advised to continue antibacterial soaks - betadine if possible. Follow up with podiatry as her nail likely needs to be removed.

## 2018-01-08 NOTE — Assessment & Plan Note (Signed)
Seems to have had some early clinical improvement. Will continue valganciclovir x 28 days. She received patient assistance for this course of treatment and was not required to pay anything up front which they appreciate greatly.   Will return in 3 weeks to reassess at the end of her treatment. Recommended treatment course usually between 3-6 weeks. Considering she is not immunosuppressed will do 4 weeks. Check PCR again at the end of treatment. Again considering she is immunocompetent I do not think she will need secondary prophylaxis. May consider sending Quantiferon CMV assay if we need help to determine this if we have concerns in the future.

## 2018-01-08 NOTE — Patient Instructions (Addendum)
Please check CBC tomorrow with your hematology appointment. We will need to do this weekly while you are on this medication.   Will start you on Keflex for your toe - please take one capsule twice a day until gone. Can open capsules and put them in pudding, yogurt or other semi-liquid to help you swallow.   Please make sure you see podiatry - I worry that nail needs to come off.   Would like to see you back in 3 weeks.

## 2018-01-08 NOTE — Assessment & Plan Note (Addendum)
Most recent of 4 EGDs with evidence of scattered CMV stromal areas occupying ulcer of esophagus in this immunocompetent patient. Also with large area of stenosis noted, now is unable to pass scope described to be "firm." I am not certain that these scattered findings on stain are causing all of her symptoms. She has had significant progression of this stenotic area in her distal esophagus that is well described reading through the four EGD procedures.   I discussed my skepticism with [name] and her daughter [name] today and worry that this is not the primary driver of her symptoms. However considering she has had significant weight loss we decided to trial her on liquid valganciclovir 450 mg QD (CC < 77mL) for 28 days to see if there is any improvement in her symptoms. Tablet is coated and unable to be crushed and only other alternative is IV ganciclovir which she would prefer to not use.   She will return in 1 week after starting medication to reassess. I called and discussed with Ms. Jacqulyn Liner, NP at GI clinic to share our plan and my reservations. Considering now inability to pass scope I think GI should still check CT scan of chest/abdomen despite this finding on her pathology. I ultimately worry that she may require percutaneous tube to bypass the stenotic area.   Will check serum quant CMV PCR to see if this would be useful in monitoring. If EGD is repeated in the future would send tissue of ulcerated area for PCR testing to detect CMV DNA specifically vs stain.

## 2018-01-09 ENCOUNTER — Inpatient Hospital Stay: Payer: Medicare Other

## 2018-01-09 VITALS — BP 128/50 | HR 67 | Temp 97.6°F

## 2018-01-09 DIAGNOSIS — K208 Other esophagitis without bleeding: Secondary | ICD-10-CM

## 2018-01-09 DIAGNOSIS — D509 Iron deficiency anemia, unspecified: Secondary | ICD-10-CM

## 2018-01-09 DIAGNOSIS — B259 Cytomegaloviral disease, unspecified: Secondary | ICD-10-CM

## 2018-01-09 DIAGNOSIS — B258 Other cytomegaloviral diseases: Principal | ICD-10-CM

## 2018-01-09 LAB — CBC WITH DIFFERENTIAL/PLATELET
Basophils Absolute: 0.1 10*3/uL (ref 0–0.1)
Basophils Relative: 1 %
Eosinophils Absolute: 0.2 10*3/uL (ref 0–0.7)
Eosinophils Relative: 5 %
HCT: 27.8 % — ABNORMAL LOW (ref 35.0–47.0)
Hemoglobin: 9.1 g/dL — ABNORMAL LOW (ref 12.0–16.0)
Lymphocytes Relative: 19 %
Lymphs Abs: 1 10*3/uL (ref 1.0–3.6)
MCH: 26.8 pg (ref 26.0–34.0)
MCHC: 32.8 g/dL (ref 32.0–36.0)
MCV: 81.8 fL (ref 80.0–100.0)
Monocytes Absolute: 0.3 10*3/uL (ref 0.2–0.9)
Monocytes Relative: 6 %
Neutro Abs: 3.5 10*3/uL (ref 1.4–6.5)
Neutrophils Relative %: 69 %
Platelets: 308 10*3/uL (ref 150–440)
RBC: 3.4 MIL/uL — ABNORMAL LOW (ref 3.80–5.20)
RDW: 16.3 % — ABNORMAL HIGH (ref 11.5–14.5)
WBC: 5.1 10*3/uL (ref 3.6–11.0)

## 2018-01-09 MED ORDER — IRON SUCROSE 20 MG/ML IV SOLN
200.0000 mg | Freq: Once | INTRAVENOUS | Status: AC
  Administered 2018-01-09: 200 mg via INTRAVENOUS
  Filled 2018-01-09: qty 10

## 2018-01-10 NOTE — Progress Notes (Signed)
Thank you very much 

## 2018-01-16 ENCOUNTER — Ambulatory Visit: Payer: Self-pay

## 2018-01-16 ENCOUNTER — Other Ambulatory Visit: Payer: Self-pay

## 2018-01-17 ENCOUNTER — Inpatient Hospital Stay: Payer: Medicare Other

## 2018-01-17 ENCOUNTER — Other Ambulatory Visit: Payer: Self-pay | Admitting: Hematology and Oncology

## 2018-01-17 VITALS — BP 114/65 | HR 56 | Temp 97.8°F | Resp 18

## 2018-01-17 DIAGNOSIS — D509 Iron deficiency anemia, unspecified: Secondary | ICD-10-CM | POA: Diagnosis not present

## 2018-01-17 DIAGNOSIS — K208 Other esophagitis without bleeding: Secondary | ICD-10-CM

## 2018-01-17 DIAGNOSIS — B258 Other cytomegaloviral diseases: Principal | ICD-10-CM

## 2018-01-17 LAB — CBC WITH DIFFERENTIAL/PLATELET
Basophils Absolute: 0.1 10*3/uL (ref 0–0.1)
Basophils Relative: 1 %
Eosinophils Absolute: 0.2 10*3/uL (ref 0–0.7)
Eosinophils Relative: 4 %
HCT: 30.1 % — ABNORMAL LOW (ref 35.0–47.0)
Hemoglobin: 9.7 g/dL — ABNORMAL LOW (ref 12.0–16.0)
Lymphocytes Relative: 23 %
Lymphs Abs: 1.3 10*3/uL (ref 1.0–3.6)
MCH: 26.4 pg (ref 26.0–34.0)
MCHC: 32.1 g/dL (ref 32.0–36.0)
MCV: 82.3 fL (ref 80.0–100.0)
Monocytes Absolute: 0.1 10*3/uL — ABNORMAL LOW (ref 0.2–0.9)
Monocytes Relative: 2 %
Neutro Abs: 3.9 10*3/uL (ref 1.4–6.5)
Neutrophils Relative %: 70 %
Platelets: 313 10*3/uL (ref 150–440)
RBC: 3.66 MIL/uL — ABNORMAL LOW (ref 3.80–5.20)
RDW: 16.9 % — ABNORMAL HIGH (ref 11.5–14.5)
WBC: 5.5 10*3/uL (ref 3.6–11.0)

## 2018-01-17 MED ORDER — IRON SUCROSE 20 MG/ML IV SOLN
200.0000 mg | Freq: Once | INTRAVENOUS | Status: AC
  Administered 2018-01-17: 200 mg via INTRAVENOUS
  Filled 2018-01-17: qty 10

## 2018-01-17 MED ORDER — SODIUM CHLORIDE 0.9 % IV SOLN
Freq: Once | INTRAVENOUS | Status: AC
  Administered 2018-01-17: 12:00:00 via INTRAVENOUS
  Filled 2018-01-17: qty 1000

## 2018-01-23 ENCOUNTER — Inpatient Hospital Stay: Payer: Medicare Other

## 2018-01-23 VITALS — BP 148/57 | HR 59 | Temp 97.4°F | Resp 18

## 2018-01-23 DIAGNOSIS — D509 Iron deficiency anemia, unspecified: Secondary | ICD-10-CM | POA: Diagnosis not present

## 2018-01-23 DIAGNOSIS — K208 Other esophagitis without bleeding: Secondary | ICD-10-CM

## 2018-01-23 DIAGNOSIS — B259 Cytomegaloviral disease, unspecified: Secondary | ICD-10-CM

## 2018-01-23 DIAGNOSIS — B258 Other cytomegaloviral diseases: Principal | ICD-10-CM

## 2018-01-23 LAB — CBC WITH DIFFERENTIAL/PLATELET
Basophils Absolute: 0 10*3/uL (ref 0–0.1)
Basophils Relative: 1 %
Eosinophils Absolute: 0.1 10*3/uL (ref 0–0.7)
Eosinophils Relative: 2 %
HCT: 28.5 % — ABNORMAL LOW (ref 35.0–47.0)
Hemoglobin: 9.4 g/dL — ABNORMAL LOW (ref 12.0–16.0)
Lymphocytes Relative: 19 %
Lymphs Abs: 0.9 10*3/uL — ABNORMAL LOW (ref 1.0–3.6)
MCH: 27.6 pg (ref 26.0–34.0)
MCHC: 32.8 g/dL (ref 32.0–36.0)
MCV: 84.1 fL (ref 80.0–100.0)
Monocytes Absolute: 0.1 10*3/uL — ABNORMAL LOW (ref 0.2–0.9)
Monocytes Relative: 3 %
Neutro Abs: 3.6 10*3/uL (ref 1.4–6.5)
Neutrophils Relative %: 75 %
Platelets: 264 10*3/uL (ref 150–440)
RBC: 3.39 MIL/uL — ABNORMAL LOW (ref 3.80–5.20)
RDW: 18 % — ABNORMAL HIGH (ref 11.5–14.5)
WBC: 4.7 10*3/uL (ref 3.6–11.0)

## 2018-01-23 MED ORDER — IRON SUCROSE 20 MG/ML IV SOLN
200.0000 mg | Freq: Once | INTRAVENOUS | Status: AC
  Administered 2018-01-23: 200 mg via INTRAVENOUS
  Filled 2018-01-23: qty 10

## 2018-01-23 MED ORDER — SODIUM CHLORIDE 0.9 % IV SOLN
Freq: Once | INTRAVENOUS | Status: AC
  Administered 2018-01-23: 14:00:00 via INTRAVENOUS
  Filled 2018-01-23: qty 1000

## 2018-01-30 ENCOUNTER — Ambulatory Visit (INDEPENDENT_AMBULATORY_CARE_PROVIDER_SITE_OTHER): Payer: Medicare Other | Admitting: Infectious Diseases

## 2018-01-30 ENCOUNTER — Encounter: Payer: Self-pay | Admitting: Infectious Diseases

## 2018-01-30 VITALS — BP 182/62 | HR 67 | Temp 97.5°F | Wt 115.0 lb

## 2018-01-30 DIAGNOSIS — B258 Other cytomegaloviral diseases: Secondary | ICD-10-CM

## 2018-01-30 DIAGNOSIS — Z5181 Encounter for therapeutic drug level monitoring: Secondary | ICD-10-CM | POA: Diagnosis not present

## 2018-01-30 DIAGNOSIS — K208 Other esophagitis without bleeding: Secondary | ICD-10-CM

## 2018-01-30 NOTE — Patient Instructions (Signed)
We are working to submit a request to refill your Valcyte for another 2 week supply (they will send you 30 days likely) to continue to trial this as you are having some benefit.   I will be in touch with your GI team to discuss next steps with regards to what is next

## 2018-01-30 NOTE — Progress Notes (Signed)
Patient: [name] Day  DOB: 02-28-1929 MRN: [id number] PCP: [name], MD  Referring Provider: Dr Earmon Phoenix / PA Jacqulyn Liner (GI team @ [facility])  Patient Active Problem List   Diagnosis Date Noted  . Itching 01/08/2018  . Medication monitoring encounter 01/08/2018  . Paronychia and onychomycosis great toe, right  01/08/2018  . 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 Hypertension 04/30/2017  . Mesenteric ischemia ([place]) 03/01/2017  . Type 2 diabetes mellitus with complication ([place]) 09/73/5329  . Iron deficiency anemia 02/28/2017  . PAD (peripheral artery disease) ([place]) 02/05/2017     Subjective:   Chief Complaint  Patient presents with  . Dysphagia    cmv esophagitis    Brief Narrative: [name] is an 84 y.o. F with health history detailed below but significant for weight loss, dysphagia/odonyophagia and recently evidence of CMV infection on recent endoscopy pathology stain although not overt/diffuse ulcerations noted clinically per reports. She had a solitary ulcer with stenosis present that has been worsening over the last 18 months.   HPI:  [name] is here today with her daughter for follow up. She reports she previously was feeling better and now about the same. She has lost 1 lb since our last visit. Reports some dizziness/"vertigo" today she attributes to her blood pressure being elevated (took all of her scheduled medications for this). She tells me she still has to space out her eating significantly and still feels as if things are getting 'hung up;' also remarks she perceives a 'screeching sound' where she feels her body trying to get food to pass to stomach. She does feels she has an easier time with her pills lately although still needs to crush most. She has no epigastric pain or abdominal tenderness where she did have this previously.   Her toe is improved after antibiotic and removal of toe nail by podiatry.   Review of Systems  Constitutional:  Negative for chills, fever, malaise/fatigue and weight loss.  HENT: Negative for tinnitus.   Eyes: Negative for blurred vision and photophobia.  Respiratory: Negative for cough and sputum production.   Cardiovascular: Negative for chest pain.  Gastrointestinal: Positive for heartburn. Negative for abdominal pain, diarrhea, nausea and vomiting.       Difficulty swallowing  Genitourinary: Negative for dysuria.  Musculoskeletal: Negative for joint pain.  Skin: Negative for itching and rash.  Neurological: Positive for dizziness. Negative for headaches.    Past Medical History:  Diagnosis Date  . Anemia   . Aortic valve sclerosis   . Arrhythmia   . Arthritis    osteoarthritis  . Basal cell carcinoma of skin   . Brain tumor ([place])   . Brain tumor ([name])   . Cervical spine disease   . Diabetes mellitus without complication ([place])   . Esophageal ulcer without bleeding   . GERD (gastroesophageal reflux disease)   . Hyperlipemia   . Hypertension   . Kidney stones   . Leaky heart valve   . Meningioma ([place])   . Occlusive mesenteric ischemia ([place])   . Pulmonary hypertension ([place])     Outpatient Medications Prior to Visit  Medication Sig Dispense Refill  . acetaminophen (TYLENOL) 650 MG CR tablet Take 650 mg by mouth every 8 (eight) hours as needed for pain.    Marland Kitchen allopurinol (ZYLOPRIM) 100 MG tablet Take 100 mg by mouth at bedtime.   0  . Bioflavonoid Products (VITAMIN C) CHEW Chew 2 tablets by mouth daily.    Marland Kitchen  clopidogrel (PLAVIX) 75 MG tablet take 1 tablet by mouth once daily 30 tablet 11  . dicyclomine (BENTYL) 10 MG capsule Take 10 mg by mouth 3 (three) times daily.    . furosemide (LASIX) 40 MG tablet Take 40 mg by mouth 2 (two) times daily.   0  . glipiZIDE (GLUCOTROL) 5 MG tablet Take 5 mg by mouth daily before breakfast.    . hydrALAZINE (APRESOLINE) 50 MG tablet Take 50 mg by mouth 2 (two) times daily.     . hydrochlorothiazide (HYDRODIURIL) 25 MG tablet Take 25 mg by mouth  daily.  0  . Hypromellose (GENTEAL MILD) 0.2 % SOLN Place 1 drop into both eyes 3 (three) times daily.    Marland Kitchen lisinopril (PRINIVIL,ZESTRIL) 40 MG tablet Take 40 mg by mouth daily.   0  . metoprolol succinate (TOPROL-XL) 50 MG 24 hr tablet Take 75 mg by mouth 2 (two) times daily.   0  . pantoprazole (PROTONIX) 40 MG tablet Take 40 mg by mouth 2 (two) times daily.    Marland Kitchen rOPINIRole (REQUIP) 2 MG tablet Take 2 mg by mouth 2 (two) times daily.   0  . sertraline (ZOLOFT) 25 MG tablet Take 25 mg by mouth daily.     . sucralfate (CARAFATE) 1 g tablet Take 1 g by mouth 3 (three) times daily as needed (for difficulty swallowing).   0  . vitamin B-12 (CYANOCOBALAMIN) 1000 MCG tablet Take 1,000 mcg by mouth daily.    . valganciclovir (VALCYTE) 50 MG/ML SOLR Take 9 mLs (450 mg total) by mouth daily for 28 days. 252 mL 0   No facility-administered medications prior to visit.      Allergies  Allergen Reactions  . Gabapentin Swelling  . Lipitor [Atorvastatin] Other (See Comments)    Muscle aches  . Mevacor [Lovastatin] Other (See Comments)    Muscle aches  . Milk-Related Compounds     Large quantities cause headaches   . Septra [Sulfamethoxazole-Trimethoprim]   . Statins Other (See Comments)    Muscle pain  . Sulfur Diarrhea and Nausea And Vomiting  . Zocor [Simvastatin] Other (See Comments)    Muscle aches    Social History   Tobacco Use  . Smoking status: Never Smoker  . Smokeless tobacco: Never Used  Substance Use Topics  . Alcohol use: No  . Drug use: No    Family History  Problem Relation Age of Onset  . Stroke Mother   . Hypertension Mother   . Cancer Father   . Heart attack Sister   . Diabetes Sister   . Heart attack Brother     Objective:   Vitals:   01/30/18 1534  BP: (!) 182/62  Pulse: 67  Temp: (!) 97.5 F (36.4 C)  TempSrc: Oral  Weight: 52.2 kg (115 lb)   Body mass index is 20.37 kg/m.  Physical Exam  Constitutional: She is oriented to person, place, and  time. She appears malnourished.  Pleasant, thin elderly woman seated comfortably in the chair today. Her daughter [name] accompanies her.   HENT:  Mouth/Throat: Oropharynx is clear and moist. No oral lesions. No dental abscesses.  No ulcerations noted in mouth  Eyes: No scleral icterus.  Cardiovascular: Normal rate, regular rhythm and intact distal pulses.  Murmur heard. Pulmonary/Chest: Effort normal and breath sounds normal. No respiratory distress. She has no rales.  Abdominal: Soft. She exhibits no distension and no mass. There is no tenderness.  Musculoskeletal: She exhibits no edema or tenderness.  Lymphadenopathy:    She has no cervical adenopathy.  Neurological: She is alert and oriented to person, place, and time.  Skin: Skin is warm and dry. No rash noted.  Psychiatric: Mood, affect and judgment normal.  Vitals reviewed.   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 Serum PCR positive however low level viral load < 200 and unable to quantify. It is unclear the significance of this as it can sometimes in active disease be negative. In reading about cases of CMV esophagitis in patients (mostly HIV/immunocomprimised) it  is typically uncommon to have stricture formation following CMV ulceration and I still worry about alternative process contributing.   I think before considering any further therapy that would involve an IV and prolonged medication that could be toxic to her bone marrow I would ask her GI team to reconsider another endoscopy to re-evaluate 4 weeks or so after completing antiviral therapy (mid to late June?). Would recommend tissue biopsies to be sent specifically for CMV PCR testing. Would also recommend proceeding with esophageal CT scan since the scope was unable to pass with last study. Fortunately she has not lost any more significant weight during treatment.   Will discuss with her GI team and call [name] and her daughter with update on plan. If she is agreeable to repeat EGD would see her back after this once further investigation of CMV process has been done.       Relevant Medications   valganciclovir (VALCYTE) 50 MG/ML SOLR   Other Relevant Orders   Basic metabolic panel (Completed)   CMV (cytomegalovirus) dna ultraquant,pcr     Other   Medication monitoring encounter    Her hematology team has been very helpful regarding checking CBCs timely. She has not had any changes to her CBC profile.         Janene Madeira, MSN, NP-C [facility] for Infectious [place] Pager: [phone] Office: [phone]

## 2018-01-31 LAB — BASIC METABOLIC PANEL
BUN: 20 mg/dL (ref 7–25)
CO2: 31 mmol/L (ref 20–32)
Calcium: 8.7 mg/dL (ref 8.6–10.4)
Chloride: 107 mmol/L (ref 98–110)
Creat: 0.71 mg/dL (ref 0.60–0.88)
Glucose, Bld: 129 mg/dL — ABNORMAL HIGH (ref 65–99)
Potassium: 4.1 mmol/L (ref 3.5–5.3)
Sodium: 143 mmol/L (ref 135–146)

## 2018-01-31 MED ORDER — VALGANCICLOVIR HCL 50 MG/ML PO SOLR: 450.0000 mg | Freq: Every day | ORAL | 0 refills | Status: AC

## 2018-01-31 NOTE — Assessment & Plan Note (Addendum)
We discussed that with some improvement we will submit request to drug manufacturer/patient assistance to extend out course of treatment to 6 weeks total considering she is tolerating this from a hematologic stand point. Serum PCR positive however low level viral load < 200 and unable to quantify. It is unclear the significance of this as it can sometimes in active disease be negative. In reading about cases of CMV esophagitis in patients (mostly HIV/immunocomprimised) it is typically uncommon to have stricture formation following CMV ulceration and I still worry about alternative process contributing.   I think before considering any further therapy that would involve an IV and prolonged medication that could be toxic to her bone marrow I would ask her GI team to reconsider another endoscopy to re-evaluate 4 weeks or so after completing antiviral therapy (mid to late June?). Would recommend tissue biopsies to be sent specifically for CMV PCR testing. Would also recommend proceeding with esophageal CT scan since the scope was unable to pass with last study. Fortunately she has not lost any more significant weight during treatment.   Will discuss with her GI team and call [name] and her daughter with update on plan. If she is agreeable to repeat EGD would see her back after this once further investigation of CMV process has been done.

## 2018-01-31 NOTE — Assessment & Plan Note (Signed)
Her hematology team has been very helpful regarding checking CBCs timely. She has not had any changes to her CBC profile.

## 2018-02-01 LAB — CMV (CYTOMEGALOVIRUS) DNA ULTRAQUANT, PCR
CMV DNA, QN PCR: <2.3 log IU/mL (ref <2.30)
CMV DNA, QN Real Time PCR: <200 IU/mL (ref <200)

## 2018-02-19 ENCOUNTER — Inpatient Hospital Stay: Payer: Medicare Other | Attending: Hematology and Oncology

## 2018-02-19 DIAGNOSIS — D508 Other iron deficiency anemias: Secondary | ICD-10-CM

## 2018-02-19 DIAGNOSIS — K222 Esophageal obstruction: Secondary | ICD-10-CM | POA: Diagnosis not present

## 2018-02-19 DIAGNOSIS — R4702 Dysphasia: Secondary | ICD-10-CM | POA: Insufficient documentation

## 2018-02-19 DIAGNOSIS — D509 Iron deficiency anemia, unspecified: Secondary | ICD-10-CM | POA: Diagnosis not present

## 2018-02-19 LAB — CBC WITH DIFFERENTIAL/PLATELET
Basophils Absolute: 0.1 10*3/uL (ref 0–0.1)
Basophils Relative: 1 %
Eosinophils Absolute: 0.1 10*3/uL (ref 0–0.7)
Eosinophils Relative: 2 %
HCT: 32.8 % — ABNORMAL LOW (ref 35.0–47.0)
Hemoglobin: 10.8 g/dL — ABNORMAL LOW (ref 12.0–16.0)
Lymphocytes Relative: 20 %
Lymphs Abs: 1 10*3/uL (ref 1.0–3.6)
MCH: 28.4 pg (ref 26.0–34.0)
MCHC: 32.8 g/dL (ref 32.0–36.0)
MCV: 86.7 fL (ref 80.0–100.0)
Monocytes Absolute: 0.2 10*3/uL (ref 0.2–0.9)
Monocytes Relative: 3 %
Neutro Abs: 3.9 10*3/uL (ref 1.4–6.5)
Neutrophils Relative %: 74 %
Platelets: 267 10*3/uL (ref 150–440)
RBC: 3.79 MIL/uL — ABNORMAL LOW (ref 3.80–5.20)
RDW: 19.9 % — ABNORMAL HIGH (ref 11.5–14.5)
WBC: 5.2 10*3/uL (ref 3.6–11.0)

## 2018-02-19 LAB — FERRITIN: Ferritin: 57 ng/mL (ref 11–307)

## 2018-02-19 NOTE — Progress Notes (Signed)
[facility] day: 02/20/2018   Chief Complaint: [name] Day is a 84 y.o. female with iron deficiency anemia who is seen for 3 month assessment.  HPI:   The patient was last seen in the hematology clinic on 11/21/2017.  At that time, patient complained of continued dysphasia. Cold beverages caused increased esophageal pain.  She complained of diffuse pruritus.  She was having nosebleeds that improved with the use of a humidifier.  Patient with chronic venous stasis changes to her bilateral lower extremities.  Compression stockings in place.  Patient noted that her legs were "weeping something bad".  Exam was stable.  Hematocrit  Was 28.1 and hemoglobin 9.1. Platelets 313,000.  Ferritin was 9. Patient was scheduled for an EUS in March with Dr. Francella Solian.  She was scheduled for weekly Venofer x4 infusions.  Patient received Venofer 200 mg on 11/21/2017, then weekly from 01/09/2018 through 01/23/2018 for total of 4 infusions.  Patient underwent EUS, performed by Dr. Jola Schmidt, on 12/05/2017.  Study demonstrated esophageal stenosis. The EUS scope could not be advanced into the stenosis, so a limited exam from the proximal edge was obtained. Loss of normal wall layers and diffusely hypoechoic measuring from 3 mm to 6 mm in thickness. This could be representative of inflammation, however malignancy was also of concern. No extrinsic mass was visualized. Pathology revealed markedly inflamed fibromuscular tissue with CMV viral cytopathic effect, consistent with CMV esophagitis.  Patient was seen in consult on 12/31/2017 by Janene Madeira, NP (infectious disease).  Notes reviewed.  CMV DNA (quantitative) was positive.  Patient was started on a 28-day valganciclovir 450 mg trial.  Medication trial delayed because of insurance coverage; out-of-pocket $4000.  Financial assistance application allow patient to obtain medication as ordered.  Valganciclovir was started on  01/03/2018. Patient was discussed with GI practitioners, and the recommendations were made to proceed with CT imaging of the chest, abdomen, and pelvis for further assessment.  Percutaneous G-tube placement was discussed to bypass the area of stenosis.  Infectious disease concerned with possible myelosuppression stemming from the use of the valganciclovir, and asked that patient's CBC be monitored here in the hematology clinic:  CBC on 01/02/2018 revealed a WBC of 6400 ([place] 4500). Hemoglobin 9.6, hematocrit 29.0, MCV 82.1, and platelets 345,000.  Ferritin was 13.  CBC on 01/09/2018 revealed a WBC of 5100 ([place] 3500). Hemoglobin 9.1, hematocrit 27.8, MCV 81.8, and platelets 308,000.  CBC on 01/17/2018 revealed a WBC of 5500 ([place] 3900). Hemoglobin 9.7, hematocrit 30.1, MCV 82.3, and platelets 313,000.    CBC on 01/23/2018 revealed a WBC of 4700 ([place] 3600). Hemoglobin 9.4, hematocrit 28.8, MCV 84.1, and platelets 264,000.  CBC on 02/19/2018 revealed a WBC of 5200 ([place] 3900). Hemoglobin 10.8, hematocrit 32.8, MCV 86.7, and platelets 267,000. Ferritin was 57.   In the interim, patient is doing well overall. She experiences headaches and the sensation of her "insides jerking" with the valganciclovir. She notes come improvement with regards to her swallowing, however she frequently regurgitates foods if she eats to quickly. Patient is drinking Glucerna shakes to supplement her nutrition. She does not do well with carbonated beverages. Patient is down 6 pounds since her last visit.   Patient continues to have low energy. She has no other concerns. Patient denies B symptoms. She is on her second month of valganciclovir.   Patient denies pain in the clinic today.    Past Medical History:  Diagnosis Date  .  Anemia   . Aortic valve sclerosis   . Arrhythmia   . Arthritis    osteoarthritis  . Basal cell carcinoma of skin   . Brain tumor ([place])   . Brain tumor ([place])   . Cervical spine disease   .  Diabetes mellitus without complication ([place])   . Esophageal ulcer without bleeding   . GERD (gastroesophageal reflux disease)   . Hyperlipemia   . Hypertension   . Kidney stones   . Leaky heart valve   . Meningioma ([place])   . 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 Pulmonary hypertension ([place])     Past Surgical History:  Procedure Laterality Date  . ABDOMINAL HYSTERECTOMY    . APPENDECTOMY    . COLONOSCOPY WITH PROPOFOL N/A 06/20/2017   Procedure: COLONOSCOPY WITH PROPOFOL;  Surgeon: Lollie Sails, MD;  Location: [facility] ENDOSCOPY;  Service: Endoscopy;  Laterality: N/A;  . ESOPHAGOGASTRODUODENOSCOPY (EGD) WITH PROPOFOL N/A 03/14/2015   Procedure: ESOPHAGOGASTRODUODENOSCOPY (EGD) WITH PROPOFOL;  Surgeon: Josefine Class, MD;  Location: [facility] ENDOSCOPY;  Service: Endoscopy;  Laterality: N/A;  . ESOPHAGOGASTRODUODENOSCOPY (EGD) WITH PROPOFOL N/A 03/28/2017   Procedure: ESOPHAGOGASTRODUODENOSCOPY (EGD) WITH PROPOFOL;  Surgeon: Lollie Sails, MD;  Location: [facility] ENDOSCOPY;  Service: Endoscopy;  Laterality: N/A;  . ESOPHAGOGASTRODUODENOSCOPY (EGD) WITH PROPOFOL N/A 06/20/2017   Procedure: ESOPHAGOGASTRODUODENOSCOPY (EGD) WITH PROPOFOL;  Surgeon: Lollie Sails, MD;  Location: [facility] ENDOSCOPY;  Service: Endoscopy;  Laterality: N/A;  . ESOPHAGOGASTRODUODENOSCOPY (EGD) WITH PROPOFOL N/A 10/21/2017   Procedure: ESOPHAGOGASTRODUODENOSCOPY (EGD) WITH PROPOFOL;  Surgeon: Lollie Sails, MD;  Location: [facility] ENDOSCOPY;  Service: Endoscopy;  Laterality: N/A;  . EYE SURGERY    . HYSTERECTOMY ABDOMINAL WITH SALPINGECTOMY    . PERIPHERAL VASCULAR CATHETERIZATION N/A 01/23/2016   Procedure: Visceral Venography;  Surgeon: Algernon Huxley, MD;  Location: [place] CV LAB;  Service: Cardiovascular;  Laterality: N/A;  . PERIPHERAL VASCULAR CATHETERIZATION  01/23/2016   Procedure: Peripheral Vascular Intervention;  Surgeon: Algernon Huxley, MD;  Location: [place] CV LAB;  Service: Cardiovascular;;   . UPPER ESOPHAGEAL ENDOSCOPIC ULTRASOUND (EUS) N/A 12/05/2017   Procedure: UPPER ESOPHAGEAL ENDOSCOPIC ULTRASOUND (EUS);  Surgeon: Jola Schmidt, MD;  Location: [facility] ENDOSCOPY;  Service: Endoscopy;  Laterality: N/A;  . VISCERAL ANGIOGRAPHY N/A 03/18/2017   Procedure: Visceral Angiography;  Surgeon: Algernon Huxley, MD;  Location: [place] CV LAB;  Service: Cardiovascular;  Laterality: N/A;  . VISCERAL ARTERY INTERVENTION N/A 03/18/2017   Procedure: Visceral Artery Intervention;  Surgeon: Algernon Huxley, MD;  Location: [place] CV LAB;  Service: Cardiovascular;  Laterality: N/A;    Family History  Problem Relation Age of Onset  . Stroke Mother   . Hypertension Mother   . Cancer Father   . Heart attack Sister   . Diabetes Sister   . Heart attack Brother     Social History:  reports that she has never smoked. She has never used smokeless tobacco. She reports that she does not drink alcohol or use drugs.  She denies any exposure to radiation or toxins.  She is retired.  She worked at [employer].  She lives in [place].  Patient's contact number is [phone].  Daughter's number is [phone] [name]).  The patient is accompanied by her daughter, [name],  today.   Allergies:  Allergies  Allergen Reactions  . Gabapentin Swelling  . Lipitor [Atorvastatin] Other (See Comments)    Muscle aches  . Mevacor [Lovastatin] Other (See Comments)  Muscle aches  . Milk-Related Compounds     Large quantities cause headaches   . Septra [Sulfamethoxazole-Trimethoprim]   . Statins Other (See Comments)    Muscle pain  . Sulfur Diarrhea and Nausea And Vomiting  . Zocor [Simvastatin] Other (See Comments)    Muscle aches    Current Medications: Current Outpatient Medications  Medication Sig Dispense Refill  . acetaminophen (TYLENOL) 650 MG CR tablet Take 650 mg by mouth every 8 (eight) hours as needed for pain.    Marland Kitchen allopurinol (ZYLOPRIM) 100 MG tablet Take 100 mg by mouth at  bedtime.   0  . Bioflavonoid Products (VITAMIN C) CHEW Chew 2 tablets by mouth daily.    . clopidogrel (PLAVIX) 75 MG tablet take 1 tablet by mouth once daily 30 tablet 11  . dicyclomine (BENTYL) 10 MG capsule Take 10 mg by mouth 3 (three) times daily.    . furosemide (LASIX) 40 MG tablet Take 40 mg by mouth 2 (two) times daily.   0  . glipiZIDE (GLUCOTROL) 5 MG tablet Take 5 mg by mouth daily before breakfast.    . hydrALAZINE (APRESOLINE) 50 MG tablet Take 50 mg by mouth 2 (two) times daily.     . hydrochlorothiazide (HYDRODIURIL) 25 MG tablet Take 25 mg by mouth daily.  0  . Hypromellose (GENTEAL MILD) 0.2 % SOLN Place 1 drop into both eyes 3 (three) times daily.    Marland Kitchen lisinopril (PRINIVIL,ZESTRIL) 40 MG tablet Take 40 mg by mouth daily.   0  . metoprolol succinate (TOPROL-XL) 50 MG 24 hr tablet Take 75 mg by mouth 2 (two) times daily.   0  . pantoprazole (PROTONIX) 40 MG tablet Take 40 mg by mouth 2 (two) times daily.    Marland Kitchen rOPINIRole (REQUIP) 2 MG tablet Take 2 mg by mouth 2 (two) times daily.   0  . sertraline (ZOLOFT) 25 MG tablet Take 25 mg by mouth daily.     . sucralfate (CARAFATE) 1 g tablet Take 1 g by mouth 3 (three) times daily as needed (for difficulty swallowing).   0  . valGANciclovir HCl (VALCYTE PO) Take 9 mLs by mouth daily.    . vitamin B-12 (CYANOCOBALAMIN) 1000 MCG tablet Take 1,000 mcg by mouth daily.     No current facility-administered medications for this visit.     Review of Systems  Constitutional: Positive for malaise/fatigue and weight loss (down 6 pounds). Negative for diaphoresis and fever.  HENT: Negative.   Eyes: Negative for pain and redness.       Chronic vision problems  Respiratory: Negative for cough, hemoptysis, sputum production and shortness of breath.   Cardiovascular: Positive for leg swelling. Negative for chest pain, palpitations, orthopnea and PND.  Gastrointestinal: Negative for abdominal pain, blood in stool, constipation, diarrhea, melena,  nausea and vomiting.       Dysphagia. Esophageal stricture.   Genitourinary: Negative for dysuria, frequency, hematuria and urgency.  Musculoskeletal: Positive for joint pain (chronic arthritic pain). Negative for back pain, falls and myalgias.  Skin: Positive for itching (secondary to dry skin). Negative for rash.  Neurological: Negative for dizziness, tremors, weakness and headaches.  Endo/Heme/Allergies: Bruises/bleeds easily (senile ecchymosis).  Psychiatric/Behavioral: Negative for depression, memory loss and suicidal ideas. The patient is not nervous/anxious and does not have insomnia.   All other systems reviewed and are negative.  Performance status (ECOG): 1 - Symptomatic but completely ambulatory  Physical Exam: Blood pressure (!) 157/71, pulse 66, temperature (!) 97.5 F (36.4  C), temperature source Tympanic, resp. rate 18, weight 114 lb 7 oz (51.9 kg), SpO2 100 %. GENERAL:  Thin elderly woman sitting comfortably in a wheelchair in the exam room in no acute distress. MENTAL STATUS:  Alert and oriented to person, place and time. HEAD:  Short gray hair.  Normocephalic, atraumatic, face symmetric, no Cushingoid features. EYES:  Glasses.  Pupils equal round and reactive to light and accomodation.  No conjunctivitis or scleral icterus. ENT:  Oropharynx clear without lesion.  Tongue normal. Mucous membranes moist.  RESPIRATORY:  Clear to auscultation without rales, wheezes or rhonchi. CARDIOVASCULAR:  Regular rate and rhythm without murmur, rub or gallop. ABDOMEN:  Soft, non-tender, with active bowel sounds, and no hepatosplenomegaly.  No masses. SKIN:  No rashes, ulcers or lesions. EXTREMITIES: Bilateral 1-2+ pitting lower extremity edema.  No skin discoloration or tenderness.  No palpable cords. LYMPH NODES: No palpable cervical, supraclavicular, axillary or inguinal adenopathy  NEUROLOGICAL: Unremarkable. PSYCH:  Appropriate.    Appointment on 02/19/2018  Component Date Value  Ref Range Status  . WBC 02/19/2018 5.2  3.6 - 11.0 K/uL Final  . RBC 02/19/2018 3.79* 3.80 - 5.20 MIL/uL Final  . Hemoglobin 02/19/2018 10.8* 12.0 - 16.0 g/dL Final  . HCT 02/19/2018 32.8* 35.0 - 47.0 % Final  . MCV 02/19/2018 86.7  80.0 - 100.0 fL Final  . MCH 02/19/2018 28.4  26.0 - 34.0 pg Final  . MCHC 02/19/2018 32.8  32.0 - 36.0 g/dL Final  . RDW 02/19/2018 19.9* 11.5 - 14.5 % Final  . Platelets 02/19/2018 267  150 - 440 K/uL Final  . Neutrophils Relative % 02/19/2018 74  % Final  . Neutro Abs 02/19/2018 3.9  1.4 - 6.5 K/uL Final  . Lymphocytes Relative 02/19/2018 20  % Final  . Lymphs Abs 02/19/2018 1.0  1.0 - 3.6 K/uL Final  . Monocytes Relative 02/19/2018 3  % Final  . Monocytes Absolute 02/19/2018 0.2  0.2 - 0.9 K/uL Final  . Eosinophils Relative 02/19/2018 2  % Final  . Eosinophils Absolute 02/19/2018 0.1  0 - 0.7 K/uL Final  . Basophils Relative 02/19/2018 1  % Final  . Basophils Absolute 02/19/2018 0.1  0 - 0.1 K/uL Final   Performed at [facility], [address], [place], [place] [address]  . Ferritin 02/19/2018 57  11 - 307 ng/mL Final   Performed at [facility], [address] [place], [place] [address]    Assessment:  CHARLESETTA MILLIRON Day is a 84 y.o. female with iron deficiency anemia.  She has been on oral iron x 1 year with continued decline in her counts.  She has dysphagia and odynophagia.  She is on  PPI and carafate.  Diet is modest.  She denies pica.  Abdomen and pelvic CT on 02/19/2017 revealed no acute abnormality.  She has mild sigmoid diverticulosis.  CBC on 02/26/2017 revealed a hematocrit of 25.3, hemoglobin 8.0, and MCV 83.2.  Hemoccult studies x 1 were positive in 11/19/2016 and negative x 2 in 01/2017.  Ferritin was 5 with an iron saturation of 4%, and a TIBC of 487 on 01/30/2017.  Normal studies included:  creatinine (1.0), calcium, albumin, B12 (647), and TSH.  Folic acid was 51.7 on 08/09/2015.  Urinalysis revealed no blood on  01/23/2017.  She received Venofer weekly x 3 (02/28/2017 - 03/14/2017), x 2 (04/19/2017 - 04/26/2017), x 2 (06/14/2017 and 06/21/2017), x 1 (11/21/2017), and x 3 (01/09/2018 - 01/23/2018).  Ferritin has been  followed: 8 on 02/28/2017, 164 on 03/19/2017, 27 on 04/18/2017, 82 on 05/17/2017, 38 on 06/10/2017, 100 on 07/18/2017, 40 on 09/18/2017, 9 on 11/19/2017, 13 on 01/02/2018, and 57 on 02/19/2018.   EGD in 2016 revealed gastric ulcers and esophageal stricture which was dilated.  Barium swallow on 11/15/2015 was normal.  She has a history of mesenteric ischemia.  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  She was on Coumadin, and now on Plavix.  Colonoscopy in 2008 revealed diverticulosis.   EGD on 03/28/2017 revealed a moderate-sized area of extrinsic compression was found in the mid esophagus from about 25-28 cm, no apparent defect or abnormality in the mucosa or esophageal wall. There was a 2 cm cratered esophageal ulcer.  There was segmental moderate mucosal changes characterized by granularity at the gastroesophageal junction.  There was localized mild inflammation characterized by congestion (edema), depression and erythema was found on the greater curvature of the gastric body.  Biopsies revealed no H pylori, dysplasia or malignancy.  She is on pantoprazole and Carafate.  She has esophageal spasms improved with Bentyl.  EGD on 06/20/2017 revealed a non-bleeding esophageal ulcer, gastritis, and a normal duodenum.  Pathology revealed mild reactive gastropathy.  Colonoscopy on 06/20/2017 revealed non-bleeding external hemorrhoids, diverticulosis in the sigmoid colon, and one 1 mm polyp at the recto-sigmoid colon.  Pathology revealed a tubular adenoma  Negative for high grade dysplasia or malignancy.  EUS on 12/05/2017 demonstrated esophageal stenosis. The EUS scope could not be advanced into the stenosis so limited exam from the proximal edge was obtained. Loss of normal wall layers and diffusely  hypoechoic measuring from 3 mm to 6 mm in thickness. This could be representative of inflammation, however malignancy was also of concern. No extrinsic mass visualized. Pathology revealed markedly inflamed fibromuscular tissue with CMV viral cytopathic effect, consistent with CMV esophagitis. She was started on valganciclovir 450 mg  on 01/03/2018.    Symptomatically, she feels "ok".  She experiences headaches and the sensation of her "insides jerking" with the valganciclovir. The jerking sensation is more pronounced if she does not having something on her stomach when she takes this medication. Swallowing has improved "some". She is drinking Glucerna shakes to supplement her nutrition. She does not do well with carbonated beverages. Patient is down 6 pounds since her last visit  Exam is stable.    Plan: 1. Review labs from 02/19/2018.  Hemoglobin 10.8 (improved), hematocrit 32.8, MCV 86.7, and platelets 267,000. Ferritin is 57.  2. Discuss iron deficiency anemia.  Anemia improved with IV Venofer infusions. Ferritin 57 (goal > 100). Will give an additional Venofer 200 mg IV x 1 dose today.   3. Review EUS - esophageal stricture. Pathology (+) for CMV esophagitis. Continues on valganciclovir 450 mg daily as prescribed by infectious disease.  Discuss recommendations for CT imaging of the chest, abdomen, and pelvis. Will need to see GI to discuss imaging. Spoke with [facility] and patient scheduled for 03/27/2018 at 11:00am. They have patient on as a "high priority" on their cancellation list.  4. Discuss weight loss. Patient has esophageal defect causing vomiting with solid foods. Has concurrent CMV esophagitis. Continues to lose weight (down 6 pounds since last visit). Using supplement shakes TID. PEG tube discussed with patient by ID provider. Will have meet with cancer center RD today while in infusion.  5. RTC in 6 weeks for labs (CBC with diff, ferritin). 6. RTC in 3 months for MD assessment and labs (CBC with  diff,  ferritin-day before) +/- Venofer.   Honor Loh, NP  02/20/2018, 9:58 AM   I saw and evaluated the patient, participating in the key portions of the service and reviewing pertinent diagnostic studies and records.  I reviewed the nurse practitioner's note and agree with the findings and the plan.  The assessment and plan were discussed with the patient.  A few questions were asked by the patient and answered.   Nolon Stalls, MD 02/20/2018,9:58 AM

## 2018-02-20 ENCOUNTER — Encounter: Payer: Self-pay | Admitting: Hematology and Oncology

## 2018-02-20 ENCOUNTER — Inpatient Hospital Stay (HOSPITAL_BASED_OUTPATIENT_CLINIC_OR_DEPARTMENT_OTHER): Payer: Medicare Other | Admitting: Hematology and Oncology

## 2018-02-20 ENCOUNTER — Ambulatory Visit: Payer: Medicare Other

## 2018-02-20 ENCOUNTER — Inpatient Hospital Stay: Payer: Medicare Other

## 2018-02-20 VITALS — BP 157/71 | HR 66 | Temp 97.5°F | Resp 18 | Wt 114.4 lb

## 2018-02-20 VITALS — BP 167/56 | HR 76 | Resp 20

## 2018-02-20 DIAGNOSIS — D509 Iron deficiency anemia, unspecified: Secondary | ICD-10-CM

## 2018-02-20 DIAGNOSIS — D508 Other iron deficiency anemias: Secondary | ICD-10-CM

## 2018-02-20 DIAGNOSIS — R4702 Dysphasia: Secondary | ICD-10-CM

## 2018-02-20 DIAGNOSIS — B259 Cytomegaloviral disease, unspecified: Secondary | ICD-10-CM

## 2018-02-20 DIAGNOSIS — K222 Esophageal obstruction: Secondary | ICD-10-CM

## 2018-02-20 DIAGNOSIS — K208 Other esophagitis without bleeding: Secondary | ICD-10-CM

## 2018-02-20 DIAGNOSIS — R634 Abnormal weight loss: Secondary | ICD-10-CM

## 2018-02-20 DIAGNOSIS — B258 Other cytomegaloviral diseases: Secondary | ICD-10-CM

## 2018-02-20 MED ORDER — SODIUM CHLORIDE 0.9 % IV SOLN
Freq: Once | INTRAVENOUS | Status: AC
  Administered 2018-02-20: 11:00:00 via INTRAVENOUS
  Filled 2018-02-20: qty 1000

## 2018-02-20 MED ORDER — IRON SUCROSE 20 MG/ML IV SOLN
200.0000 mg | Freq: Once | INTRAVENOUS | Status: AC
  Administered 2018-02-20: 200 mg via INTRAVENOUS
  Filled 2018-02-20: qty 10

## 2018-02-20 NOTE — Progress Notes (Signed)
Nutrition Assessment   Reason for Assessment:   Verbal referral from [facility], NP regarding weight loss  ASSESSMENT:  Patient with iron deficiency anemia, planning IV venofer infusion today.  Chart reviewed and noted past medical history of DM, GERd, HTN, HLD, CMV esophagitis with stricture.  Patient seeing GI and ID (discussed PEG tube)  Met with patient today and daughter in waiting room as had already completed infusion.  Asked if patient wanted to go to consult room and reports "let's just stay right here." Patient reports that she is grinding/pureeing foods because she can't eat any solid foods without vomiting them back up.  Reports she has to eat very slow or foods will come back.  Reports that she is able to eat 2 pieces of toast with butter for breakfast, soups ("I am sick of soup", grinds up cube steak with gravy, chicken casserole, green beans, pinto beans.  Is able to eat ice cream and reports eating cherry pie recently.  Drinks glucerna to help her blood glucose.    Nutrition Focused Physical Exam: deferred  Medications: Vit C, bentyl, lasix, glipizide, carafate, Vit B 12  Labs: Hgb 10.8, Hct 32.8  Anthropometrics:   Height: 63 inches Weight: 114 lb 7 oz UBW: 120 lb in 11/21/17 BMI: 20  5% weight loss in the last 3 months   Estimated Energy Needs  Kcals: 1560-1820 calories/d Protein: 78-91 g Fluid: 1.8 L/d  NUTRITION DIAGNOSIS: Inadequate oral intake related to esophageal stricture as evidenced by weight loss and limiting foods   INTERVENTION:  Discussed foods high in calorie and protein and reviewed strategies on how to increase calories in current meal plan.  Discussed oral nutrition supplements and recommend ensure plus/enlive/boost plus with at least 350 calories vs glucerna (180 calories) at least 3 times per day in addition to other food intake.  Recommend patient check blood glucose and notify doctor if blood glucose increases and have medication adjustment.   Would not recommend limiting intake to control blood glucose at this time to prevent further weight loss. Samples of shakes given today and coupons.      MONITORING, EVALUATION, GOAL: weight trends, intake   NEXT VISIT: August 22 during infusion Phone follow-up in 1 month  Ohm Dentler B. Zenia Resides, [place], [place] Registered Dietitian [phone] (pager)

## 2018-02-20 NOTE — Progress Notes (Signed)
Patient states at times her food gets hung in her esophagus and she can't get it down.  She eventually throws it up.  Otherwise, she offers no complaints.

## 2018-02-20 NOTE — Patient Instructions (Addendum)
You are scheduled to see GI Jacqulyn Liner, NP) on 03/27/2018 at 11:00 am. Please call Butch Penny at [facility] if you cannot keep this appointment.  They have you on their cancellation list as a "high priority" patient. They may be able to get you in earlier if someone cancels.   Honor Loh, MSN, APRN, FNP-C, CEN Oncology/Hematology Nurse Practitioner  [facility] 02/20/18, 10:32 AM

## 2018-03-03 ENCOUNTER — Encounter: Payer: Self-pay | Admitting: Infectious Diseases

## 2018-03-03 ENCOUNTER — Ambulatory Visit: Payer: Medicare Other | Admitting: Infectious Diseases

## 2018-03-03 DIAGNOSIS — K208 Other esophagitis without bleeding: Secondary | ICD-10-CM

## 2018-03-03 DIAGNOSIS — B258 Other cytomegaloviral diseases: Secondary | ICD-10-CM | POA: Diagnosis not present

## 2018-03-03 NOTE — Progress Notes (Signed)
Patient: [name] Day  DOB: Dec 17, 1928 MRN: [id number] PCP: [name], MD  Referring Provider: Dr Earmon Phoenix / PA Jacqulyn Liner (GI team @ [facility])  Patient Active Problem List   Diagnosis Date Noted  . Itching 01/08/2018  . Medication monitoring encounter 01/08/2018  . Paronychia and onychomycosis great toe, right  01/08/2018  . 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 Hypertension 04/30/2017  . Mesenteric ischemia ([place]) 03/01/2017  . Type 2 diabetes mellitus with complication ([place]) 40/98/1191  . Iron deficiency anemia 02/28/2017  . PAD (peripheral artery disease) ([place]) 02/05/2017     Subjective:   Chief Complaint  Patient presents with  . Follow-up   Brief Narrative: [name] is an 84 y.o. F with health history detailed below but significant for weight loss, dysphagia/odonyophagia and recently evidence of CMV infection on recent endoscopy pathology stain although not overt/diffuse ulcerations noted clinically per reports. She had a solitary ulcer with stenosis present that has been worsening over the last 18 months.   HPI:  [name] is here today for follow up with her daughter [name]. She had to stop the Valcyte ~4 days ago as she attributed it to worsening vomiting. She does not report any worsened weight loss but she is still not able to get many of her pills down. No fevers, chills. Emesis is still described to be mostly food contents. Frequently has experience of gagging/hanging up of food in chest.   Review of Systems  Constitutional: Negative for chills, fever, malaise/fatigue and weight loss.  HENT: Negative for tinnitus.   Eyes: Negative for blurred vision and photophobia.  Respiratory: Negative for cough and sputum production.   Cardiovascular: Negative for chest pain.  Gastrointestinal: Positive for heartburn. Negative for abdominal pain, diarrhea, nausea and vomiting.       Difficulty swallowing  Genitourinary: Negative for dysuria.    Musculoskeletal: Negative for joint pain.  Skin: Negative for itching and rash.  Neurological: Positive for dizziness. Negative for headaches.    Past Medical History:  Diagnosis Date  . Anemia   . Aortic valve sclerosis   . Arrhythmia   . Arthritis    osteoarthritis  . Basal cell carcinoma of skin   . Brain tumor ([place])   . Brain tumor ([place])   . Cervical spine disease   . Diabetes mellitus without complication ([place])   . Esophageal ulcer without bleeding   . GERD (gastroesophageal reflux disease)   . Hyperlipemia   . Hypertension   . Kidney stones   . Leaky heart valve   . Meningioma ([place])   . 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 Pulmonary hypertension ([place])     Outpatient Medications Prior to Visit  Medication Sig Dispense Refill  . acetaminophen (TYLENOL) 650 MG CR tablet Take 650 mg by mouth every 8 (eight) hours as needed for pain.    Marland Kitchen allopurinol (ZYLOPRIM) 100 MG tablet Take 100 mg by mouth at bedtime.   0  . Bioflavonoid Products (VITAMIN C) CHEW Chew 2 tablets by mouth daily.    . clopidogrel (PLAVIX) 75 MG tablet take 1 tablet by mouth once daily 30 tablet 11  . dicyclomine (BENTYL) 10 MG capsule Take 10 mg by mouth 3 (three) times daily.    . furosemide (LASIX) 40 MG tablet Take 40 mg by mouth 2 (two) times daily.   0  . glipiZIDE (GLUCOTROL) 5 MG tablet Take 5 mg by mouth daily before breakfast.    . hydrALAZINE (APRESOLINE) 50 MG  tablet Take 50 mg by mouth 2 (two) times daily.     . hydrochlorothiazide (HYDRODIURIL) 25 MG tablet Take 25 mg by mouth daily.  0  . Hypromellose (GENTEAL MILD) 0.2 % SOLN Place 1 drop into both eyes 3 (three) times daily.    Marland Kitchen lisinopril (PRINIVIL,ZESTRIL) 40 MG tablet Take 40 mg by mouth daily.   0  . metoprolol succinate (TOPROL-XL) 50 MG 24 hr tablet Take 75 mg by mouth 2 (two) times daily.   0  . pantoprazole (PROTONIX) 40 MG tablet Take 40 mg by mouth 2 (two) times daily.    Marland Kitchen rOPINIRole (REQUIP) 2 MG tablet Take 2 mg by  mouth 2 (two) times daily.   0  . sertraline (ZOLOFT) 25 MG tablet Take 25 mg by mouth daily.     . sucralfate (CARAFATE) 1 g tablet Take 1 g by mouth 3 (three) times daily as needed (for difficulty swallowing).   0  . vitamin B-12 (CYANOCOBALAMIN) 1000 MCG tablet Take 1,000 mcg by mouth daily.    . valGANciclovir HCl (VALCYTE PO) Take 9 mLs by mouth daily.     No facility-administered medications prior to visit.      Allergies  Allergen Reactions  . Gabapentin Swelling  . Lipitor [Atorvastatin] Other (See Comments)    Muscle aches  . Mevacor [Lovastatin] Other (See Comments)    Muscle aches  . Milk-Related Compounds     Large quantities cause headaches   . Septra [Sulfamethoxazole-Trimethoprim]   . Statins Other (See Comments)    Muscle pain  . Sulfur Diarrhea and Nausea And Vomiting  . Zocor [Simvastatin] Other (See Comments)    Muscle aches    Social History   Tobacco Use  . Smoking status: Never Smoker  . Smokeless tobacco: Never Used  Substance Use Topics  . Alcohol use: No  . Drug use: No    Family History  Problem Relation Age of Onset  . Stroke Mother   . Hypertension Mother   . Cancer Father   . Heart attack Sister   . Diabetes Sister   . Heart attack Brother     Objective:   Vitals:   03/03/18 1607  BP: (!) 182/59  Pulse: 95  Temp: 98.2 F (36.8 C)  TempSrc: Oral  Weight: 114 lb (51.7 kg)   Body mass index is 20.19 kg/m.  Physical Exam  Constitutional: She is oriented to person, place, and time. She appears malnourished.  Pleasant, thin elderly woman seated comfortably in the chair today. Her daughter [name] accompanies her.   HENT:  Mouth/Throat: Oropharynx is clear and moist. No oral lesions. No dental abscesses.  No ulcerations noted in mouth  Eyes: No scleral icterus.  Cardiovascular: Normal rate, regular rhythm and intact distal pulses.  Murmur heard. Pulmonary/Chest: Effort normal and breath sounds normal. No respiratory distress.  She has no rales.  Abdominal: Soft. She exhibits no distension and no mass. There is no tenderness.  Musculoskeletal: She exhibits no edema or tenderness.  Lymphadenopathy:    She has no cervical adenopathy.  Neurological: She is alert and oriented to person, place, and time.  Skin: Skin is warm and dry. No rash noted.  Psychiatric: Mood, affect and judgment normal.  Vitals reviewed.   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 Follow up CMV pcr after 4 weeks with same VL < 200 (although did not report as detected where at other lab it did report to be detected at this low level prior to initiating medication). She has not had improvement she was hoping for. I told she and her daughter that I feel it is best to discuss again with GI colleagues that are helping with her care to consider repeating EGD to re-evaluate to see if ulceration has resolved following treatment and to look to see if any further progression of stricture. Would recommend repeating biopsy to look for CMV through tissue DNA PCR testing (preferably) and/or through cell culture. PCR detection is very sensitive and be worthwhile to consider getting considering her case is very atypical for this kind of infection (immunocompetent, 3/4 previous biopsy stains negative, atypical findings on EGD  evaluation).   We did discuss escalating therapy to IV ganciclovir for 14 days. This will require PICC line (which she does not want if she can absolutely avoid it) and close monitoring as it can cause myelosuppressive side effects causing pancytopenias. She will follow up with her GI team and discuss further at upcoming appointment in a few weeks.          Janene Madeira, MSN, NP-C [facility] [place] Pager: [phone] Office: [phone]

## 2018-03-03 NOTE — Progress Notes (Signed)
Thank you we will arrange for her to be seen in the outpatient clinic prior to further evaluation

## 2018-03-03 NOTE — Patient Instructions (Signed)
Will talk with your GI team - I think repeating your endoscopy would be a helpful next step to re-evaluate your esophagus.   Will discuss after your appointment to determine next follow up.

## 2018-03-05 NOTE — Assessment & Plan Note (Addendum)
She has completed nearly 6 weeks of treatment for possible CMV esophagitis with renally dosed Valcyte. Follow up CMV pcr after 4 weeks with same VL < 200 (although did not report as detected where at other lab it did report to be detected at this low level prior to initiating medication). She has not had improvement she was hoping for. I told she and her daughter that I feel it is best to discuss again with GI colleagues that are helping with her care to consider repeating EGD to re-evaluate to see if ulceration has resolved following treatment and to look to see if any further progression of stricture. Would recommend repeating biopsy to look for CMV through tissue DNA PCR testing (preferably) and/or through cell culture. PCR detection is very sensitive and be worthwhile to consider getting considering her case is very atypical for this kind of infection (immunocompetent, 3/4 previous biopsy stains negative, atypical findings on EGD evaluation).   We did discuss escalating therapy to IV ganciclovir for 14 days. This will require PICC line (which she does not want if she can absolutely avoid it) and close monitoring as it can cause myelosuppressive side effects causing pancytopenias. She will follow up with her GI team and discuss further at upcoming appointment in a few weeks.

## 2018-03-12 ENCOUNTER — Telehealth (INDEPENDENT_AMBULATORY_CARE_PROVIDER_SITE_OTHER): Payer: Self-pay | Admitting: Vascular Surgery

## 2018-04-04 ENCOUNTER — Encounter (INDEPENDENT_AMBULATORY_CARE_PROVIDER_SITE_OTHER): Payer: Self-pay | Admitting: Vascular Surgery

## 2018-04-04 ENCOUNTER — Ambulatory Visit (INDEPENDENT_AMBULATORY_CARE_PROVIDER_SITE_OTHER): Payer: Medicare Other | Admitting: Vascular Surgery

## 2018-04-04 ENCOUNTER — Inpatient Hospital Stay: Payer: Medicare Other | Attending: Hematology and Oncology

## 2018-04-04 VITALS — BP 165/67 | HR 79 | Resp 13 | Ht 62.5 in | Wt 115.0 lb

## 2018-04-04 DIAGNOSIS — D508 Other iron deficiency anemias: Secondary | ICD-10-CM

## 2018-04-04 DIAGNOSIS — E118 Type 2 diabetes mellitus with unspecified complications: Secondary | ICD-10-CM | POA: Diagnosis not present

## 2018-04-04 DIAGNOSIS — D509 Iron deficiency anemia, unspecified: Secondary | ICD-10-CM | POA: Insufficient documentation

## 2018-04-04 DIAGNOSIS — I10 Essential (primary) hypertension: Secondary | ICD-10-CM | POA: Diagnosis not present

## 2018-04-04 DIAGNOSIS — K559 Vascular disorder of intestine, unspecified: Secondary | ICD-10-CM

## 2018-04-04 DIAGNOSIS — E785 Hyperlipidemia, unspecified: Secondary | ICD-10-CM | POA: Diagnosis not present

## 2018-04-04 LAB — FERRITIN: Ferritin: 72 ng/mL (ref 11–307)

## 2018-04-04 LAB — CBC WITH DIFFERENTIAL/PLATELET
Basophils Absolute: 0 10*3/uL (ref 0–0.1)
Basophils Relative: 1 %
Eosinophils Absolute: 0.2 10*3/uL (ref 0–0.7)
Eosinophils Relative: 4 %
HCT: 33.6 % — ABNORMAL LOW (ref 35.0–47.0)
Hemoglobin: 11.3 g/dL — ABNORMAL LOW (ref 12.0–16.0)
Lymphocytes Relative: 27 %
Lymphs Abs: 1.4 10*3/uL (ref 1.0–3.6)
MCH: 29.8 pg (ref 26.0–34.0)
MCHC: 33.7 g/dL (ref 32.0–36.0)
MCV: 88.4 fL (ref 80.0–100.0)
Monocytes Absolute: 0.4 10*3/uL (ref 0.2–0.9)
Monocytes Relative: 8 %
Neutro Abs: 3.2 10*3/uL (ref 1.4–6.5)
Neutrophils Relative %: 60 %
Platelets: 263 10*3/uL (ref 150–440)
RBC: 3.79 MIL/uL — ABNORMAL LOW (ref 3.80–5.20)
RDW: 16.6 % — ABNORMAL HIGH (ref 11.5–14.5)
WBC: 5.2 10*3/uL (ref 3.6–11.0)

## 2018-04-04 NOTE — Progress Notes (Signed)
MRN : [id number]  [name] Day is a 84 y.o. (16-Dec-1928) female who presents with chief complaint of  Chief Complaint  Patient presents with  . Follow-up    PAD/Mesenteric-Leg swelling, can't keep food down  .  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  She has had continued weight loss largely from a result of not being able to swallow.  She says she is basically only taking liquids at this point.  She can do soups but not really any solid food and less it is very soft.  She is scheduled for an endoscopy with possible dilatation in about 2 weeks.  She is also having more leg swelling and her daughter reports that her protein stores have been low.  Current Outpatient Medications  Medication Sig Dispense Refill  . acetaminophen (TYLENOL) 650 MG CR tablet Take 650 mg by mouth every 8 (eight) hours as needed for pain.    Marland Kitchen allopurinol (ZYLOPRIM) 100 MG tablet Take 100 mg by mouth at bedtime.   0  . Bioflavonoid Products (VITAMIN C) CHEW Chew 2 tablets by mouth daily.    . clopidogrel (PLAVIX) 75 MG tablet take 1 tablet by mouth once daily 30 tablet 11  . dexlansoprazole (DEXILANT) 60 MG capsule Take by mouth.    . dicyclomine (BENTYL) 10 MG capsule Take 10 mg by mouth 3 (three) times daily.    . furosemide (LASIX) 40 MG tablet Take 40 mg by mouth 2 (two) times daily.   0  . glipiZIDE (GLUCOTROL) 5 MG tablet Take 5 mg by mouth daily before breakfast.    . hydrALAZINE (APRESOLINE) 50 MG tablet Take 50 mg by mouth 2 (two) times daily.     . hydrochlorothiazide (HYDRODIURIL) 25 MG tablet Take 25 mg by mouth daily.  0  . Hypromellose (GENTEAL MILD) 0.2 % SOLN Place 1 drop into both eyes 3 (three) times daily.    Marland Kitchen lisinopril (PRINIVIL,ZESTRIL) 40 MG tablet Take 40 mg by mouth daily.   0  . metoprolol succinate (TOPROL-XL) 50 MG 24 hr tablet Take 75 mg by mouth 2 (two) times daily.   0  . pantoprazole (PROTONIX) 40 MG tablet Take 40 mg by mouth 2  (two) times daily.    Marland Kitchen rOPINIRole (REQUIP) 2 MG tablet Take 2 mg by mouth 2 (two) times daily.   0  . sertraline (ZOLOFT) 25 MG tablet Take 25 mg by mouth daily.     . sucralfate (CARAFATE) 1 g tablet Take 1 g by mouth 3 (three) times daily as needed (for difficulty swallowing).   0  . valGANciclovir HCl (VALCYTE PO) Take 9 mLs by mouth daily.    . vitamin B-12 (CYANOCOBALAMIN) 1000 MCG tablet Take 1,000 mcg by mouth daily.     No current facility-administered medications for this visit.     Past Medical History:  Diagnosis Date  . Anemia   . Aortic valve sclerosis   . Arrhythmia   . Arthritis    osteoarthritis  . Basal cell carcinoma of skin   . Brain tumor ([place])   . Brain tumor ([place])   . Cervical spine disease   . Diabetes mellitus without complication ([place])   . Esophageal ulcer without bleeding   . GERD (gastroesophageal reflux disease)   . Hyperlipemia   . Hypertension   . Kidney stones   . Leaky heart valve   . Meningioma ([place])   . Occlusive mesenteric ischemia (  [place])   . Pulmonary hypertension ([place])     Past Surgical History:  Procedure Laterality Date  . ABDOMINAL HYSTERECTOMY    . APPENDECTOMY    . COLONOSCOPY WITH PROPOFOL N/A 06/20/2017   Procedure: COLONOSCOPY WITH PROPOFOL;  Surgeon: Lollie Sails, MD;  Location[facility] ENDOSCOPY;  Service: Endoscopy;  Laterality: N/A;  . ESOPHAGOGASTRODUODENOSCOPY (EGD) WITH PROPOFOL N/A 03/14/2015   Procedure: ESOPHAGOGASTRODUODENOSCOPY (EGD) WITH PROPOFOL;  Surgeon: Josefine Class, MD;  Location: [facility] ENDOSCOPY;  Service: Endoscopy;  Laterality: N/A;  . ESOPHAGOGASTRODUODENOSCOPY (EGD) WITH PROPOFOL N/A 03/28/2017   Procedure: ESOPHAGOGASTRODUODENOSCOPY (EGD) WITH PROPOFOL;  Surgeon: Lollie Sails, MD;  Location: [facility] ENDOSCOPY;  Service: Endoscopy;  Laterality: N/A;  . ESOPHAGOGASTRODUODENOSCOPY (EGD) WITH PROPOFOL N/A 06/20/2017   Procedure: ESOPHAGOGASTRODUODENOSCOPY (EGD) WITH PROPOFOL;  Surgeon: Lollie Sails, MD;  Location: [facility] ENDOSCOPY;  Service: Endoscopy;  Laterality: N/A;  . ESOPHAGOGASTRODUODENOSCOPY (EGD) WITH PROPOFOL N/A 10/21/2017   Procedure: ESOPHAGOGASTRODUODENOSCOPY (EGD) WITH PROPOFOL;  Surgeon: Lollie Sails, MD;  Location: [facility] ENDOSCOPY;  Service: Endoscopy;  Laterality: N/A;  . EYE SURGERY    . HYSTERECTOMY ABDOMINAL WITH SALPINGECTOMY    . PERIPHERAL VASCULAR CATHETERIZATION N/A 01/23/2016   Procedure: Visceral Venography;  Surgeon: Algernon Huxley, MD;  Location: [place] CV LAB;  Service: Cardiovascular;  Laterality: N/A;  . PERIPHERAL VASCULAR CATHETERIZATION  01/23/2016   Procedure: Peripheral Vascular Intervention;  Surgeon: Algernon Huxley, MD;  Location: [place] CV LAB;  Service: Cardiovascular;;  . UPPER ESOPHAGEAL ENDOSCOPIC ULTRASOUND (EUS) N/A 12/05/2017   Procedure: UPPER ESOPHAGEAL ENDOSCOPIC ULTRASOUND (EUS);  Surgeon: Jola Schmidt, MD;  Location: [facility] ENDOSCOPY;  Service: Endoscopy;  Laterality: N/A;  . VISCERAL ANGIOGRAPHY N/A 03/18/2017   Procedure: Visceral Angiography;  Surgeon: Algernon Huxley, MD;  Location: [place] CV LAB;  Service: Cardiovascular;  Laterality: N/A;  . VISCERAL ARTERY INTERVENTION N/A 03/18/2017   Procedure: Visceral Artery Intervention;  Surgeon: Algernon Huxley, MD;  Location: [place] CV LAB;  Service: Cardiovascular;  Laterality: N/A;    Social History  Substance Use Topics  . Smoking status: Never Smoker  . Smokeless tobacco: Never Used  . Alcohol use No    Family History      Family History  Problem Relation Age of Onset  . Stroke Mother   . Hypertension Mother   . Cancer Father   . Heart attack Sister   . Diabetes Sister   . Heart attack Brother          Allergies  Allergen Reactions  . Gabapentin Swelling  . Lipitor [Atorvastatin] Other (See Comments)    Muscle aches  . Mevacor [Lovastatin] Other (See Comments)    Muscle aches  . Milk-Related Compounds     Large  quantities cause headaches   . Statins Other (See Comments)    Muscle pain  . Sulfur Diarrhea and Nausea And Vomiting  . Zocor [Simvastatin] Other (See Comments)    Muscle aches     REVIEW OF SYSTEMS(Negative unless checked)  Constitutional: [x] Weight loss[] Fever[] Chills Cardiac:[] Chest pain[] Chest pressure[] Palpitations [] Shortness of breath when laying flat [] Shortness of breath at rest [] Shortness of breath with exertion. Vascular: [] Pain in legs with walking[] Pain in legsat rest[] Pain in legs when laying flat [] Claudication [] Pain in feet when walking [] Pain in feet at rest [] Pain in feet when laying flat [] History of DVT [] Phlebitis [] Swelling in legs [] Varicose veins [] Non-healing ulcers Pulmonary: [] Uses home oxygen [] Productive cough[] Hemoptysis [] Wheeze [] COPD [] Asthma Neurologic: [] Dizziness [] Blackouts [] Seizures [] History of stroke [] History of  TIA[] Aphasia [] Temporary blindness[] Dysphagia [] Weaknessor numbness in arms [] Weakness or numbnessin legs Musculoskeletal: [x] Arthritis [] Joint swelling [] Joint pain [] Low back pain Hematologic:[] Easy bruising[] Easy bleeding [] Hypercoagulable state [] Anemic  Gastrointestinal:[] Blood in stool[] Vomiting blood[x] Gastroesophageal reflux/heartburn[x] Abdominal pain Genitourinary: [] Chronic kidney disease [] Difficulturination [] Frequenturination [] Burning with urination[] Hematuria Skin: [] Rashes [] Ulcers [] Wounds Psychological: [] History of anxiety[] History of major depression.     Physical Examination  BP (!) 165/67 (BP Location: Right Arm, Patient Position: Sitting)   Pulse 79   Resp 13   Ht 5' 2.5" (1.588 m)   Wt 115 lb (52.2 kg)   BMI 20.70 kg/m  Gen:  Thin, somewhat frail appearing, NAD Head: [place]/AT, No temporalis wasting. Ear/Nose/Throat: Hearing somewhat diminished, nares w/o erythema or  drainage Eyes: Conjunctiva clear. Sclera non-icteric Neck: Supple.  Trachea midline Pulmonary:  Good air movement, no use of accessory muscles.  Cardiac: RRR, no JVD Vascular:  Vessel Right Left  Radial Palpable Palpable                                   Gastrointestinal: soft, non-tender/non-distended.  Musculoskeletal: M/S 5/5 throughout.  No deformity or atrophy. 1-2+ BLE edema. Neurologic: Sensation grossly intact in extremities.  Symmetrical.  Speech is fluent.  Psychiatric: Judgment intact, Mood & affect appropriate for pt's clinical situation. Dermatologic: No rashes or ulcers noted.  No cellulitis or open wounds.       Labs Recent Results (from the past 2160 hour(s))  CBC with Differential     Status: Abnormal   Collection Time: 01/09/18 11:08 AM  Result Value Ref Range   WBC 5.1 3.6 - 11.0 K/uL   RBC 3.40 (L) 3.80 - 5.20 MIL/uL   Hemoglobin 9.1 (L) 12.0 - 16.0 g/dL   HCT 27.8 (L) 35.0 - 47.0 %   MCV 81.8 80.0 - 100.0 fL   MCH 26.8 26.0 - 34.0 pg   MCHC 32.8 32.0 - 36.0 g/dL   RDW 16.3 (H) 11.5 - 14.5 %   Platelets 308 150 - 440 K/uL   Neutrophils Relative % 69 %   Neutro Abs 3.5 1.4 - 6.5 K/uL   Lymphocytes Relative 19 %   Lymphs Abs 1.0 1.0 - 3.6 K/uL   Monocytes Relative 6 %   Monocytes Absolute 0.3 0.2 - 0.9 K/uL   Eosinophils Relative 5 %   Eosinophils Absolute 0.2 0 - 0.7 K/uL   Basophils Relative 1 %   Basophils Absolute 0.1 0 - 0.1 K/uL    Comment: Performed at [facility], [address] [place], [place] [address]  CBC with Differential     Status: Abnormal   Collection Time: 01/17/18 11:19 AM  Result Value Ref Range   WBC 5.5 3.6 - 11.0 K/uL   RBC 3.66 (L) 3.80 - 5.20 MIL/uL   Hemoglobin 9.7 (L) 12.0 - 16.0 g/dL   HCT 30.1 (L) 35.0 - 47.0 %   MCV 82.3 80.0 - 100.0 fL   MCH 26.4 26.0 - 34.0 pg   MCHC 32.1 32.0 - 36.0 g/dL   RDW 16.9 (H) 11.5 - 14.5 %   Platelets 313 150 - 440 K/uL   Neutrophils Relative % 70 %   Neutro Abs  3.9 1.4 - 6.5 K/uL   Lymphocytes Relative 23 %   Lymphs Abs 1.3 1.0 - 3.6 K/uL   Monocytes Relative 2 %   Monocytes Absolute 0.1 (L) 0.2 - 0.9 K/uL   Eosinophils Relative 4 %   Eosinophils Absolute  0.2 0 - 0.7 K/uL   Basophils Relative 1 %   Basophils Absolute 0.1 0 - 0.1 K/uL    Comment: Performed at [facility], [address] [place], [place] [address]  CBC with Differential     Status: Abnormal   Collection Time: 01/23/18  1:16 PM  Result Value Ref Range   WBC 4.7 3.6 - 11.0 K/uL   RBC 3.39 (L) 3.80 - 5.20 MIL/uL   Hemoglobin 9.4 (L) 12.0 - 16.0 g/dL   HCT 28.5 (L) 35.0 - 47.0 %   MCV 84.1 80.0 - 100.0 fL   MCH 27.6 26.0 - 34.0 pg   MCHC 32.8 32.0 - 36.0 g/dL   RDW 18.0 (H) 11.5 - 14.5 %   Platelets 264 150 - 440 K/uL   Neutrophils Relative % 75 %   Neutro Abs 3.6 1.4 - 6.5 K/uL   Lymphocytes Relative 19 %   Lymphs Abs 0.9 (L) 1.0 - 3.6 K/uL   Monocytes Relative 3 %   Monocytes Absolute 0.1 (L) 0.2 - 0.9 K/uL   Eosinophils Relative 2 %   Eosinophils Absolute 0.1 0 - 0.7 K/uL   Basophils Relative 1 %   Basophils Absolute 0.0 0 - 0.1 K/uL    Comment: Performed at [facility], [address] [place], [place] [address]  Basic metabolic panel     Status: Abnormal   Collection Time: 01/30/18  4:33 PM  Result Value Ref Range   Glucose, Bld 129 (H) 65 - 99 mg/dL    Comment: .            Fasting reference interval . For someone without known diabetes, a glucose value >125 mg/dL indicates that they may have diabetes and this should be confirmed with a follow-up test. .    BUN 20 7 - 25 mg/dL   Creat 0.71 0.60 - 0.88 mg/dL    Comment: For patients >31 years of age, the reference limit for Creatinine is approximately 13% higher for people identified as African-American. .    BUN/Creatinine Ratio NOT APPLICABLE 6 - 22 (calc)   Sodium 143 135 - 146 mmol/L   Potassium 4.1 3.5 - 5.3 mmol/L   Chloride 107 98 - 110 mmol/L   CO2 31 20 - 32 mmol/L   Calcium  8.7 8.6 - 10.4 mg/dL  CMV (cytomegalovirus) dna ultraquant,pcr     Status: None   Collection Time: 01/30/18  4:33 PM  Result Value Ref Range   Source Whole Blood    CMV DNA, QN Real Time PCR <200 <200 IU/mL   CMV DNA, QN PCR <2.30 <2.30 log IU/mL    Comment: . This test was developed and its analytical performance characteristics have been determined by [place], [place]. It has not been cleared or approved by the U.S. Food and Drug Administration. This assay has been validated pursuant to the CLIA regulations and is used for clinical purposes. . . For additional information, please refer to http://education.[facility].com/faq/CMVandEBVPCR (This link is being provided for informational/ educational purposes only.) .   CBC with Differential     Status: Abnormal   Collection Time: 02/19/18  2:42 PM  Result Value Ref Range   WBC 5.2 3.6 - 11.0 K/uL   RBC 3.79 (L) 3.80 - 5.20 MIL/uL   Hemoglobin 10.8 (L) 12.0 - 16.0 g/dL   HCT 32.8 (L) 35.0 - 47.0 %   MCV 86.7 80.0 - 100.0 fL   MCH 28.4 26.0 -  34.0 pg   MCHC 32.8 32.0 - 36.0 g/dL   RDW 19.9 (H) 11.5 - 14.5 %   Platelets 267 150 - 440 K/uL   Neutrophils Relative % 74 %   Neutro Abs 3.9 1.4 - 6.5 K/uL   Lymphocytes Relative 20 %   Lymphs Abs 1.0 1.0 - 3.6 K/uL   Monocytes Relative 3 %   Monocytes Absolute 0.2 0.2 - 0.9 K/uL   Eosinophils Relative 2 %   Eosinophils Absolute 0.1 0 - 0.7 K/uL   Basophils Relative 1 %   Basophils Absolute 0.1 0 - 0.1 K/uL    Comment: Performed at [facility], [address] [place], [place] [address]  Ferritin     Status: None   Collection Time: 02/19/18  2:42 PM  Result Value Ref Range   Ferritin 57 11 - 307 ng/mL    Comment: Performed at [facility], [address] [place], [place] [address]  Ferritin     Status: None   Collection Time: 04/04/18 10:39 AM  Result Value Ref Range   Ferritin 72 11 - 307 ng/mL    Comment: Performed  at [facility], [address] [place], [place] [address]  CBC with Differential     Status: Abnormal   Collection Time: 04/04/18 10:39 AM  Result Value Ref Range   WBC 5.2 3.6 - 11.0 K/uL   RBC 3.79 (L) 3.80 - 5.20 MIL/uL   Hemoglobin 11.3 (L) 12.0 - 16.0 g/dL   HCT 33.6 (L) 35.0 - 47.0 %   MCV 88.4 80.0 - 100.0 fL   MCH 29.8 26.0 - 34.0 pg   MCHC 33.7 32.0 - 36.0 g/dL   RDW 16.6 (H) 11.5 - 14.5 %   Platelets 263 150 - 440 K/uL   Neutrophils Relative % 60 %   Neutro Abs 3.2 1.4 - 6.5 K/uL   Lymphocytes Relative 27 %   Lymphs Abs 1.4 1.0 - 3.6 K/uL   Monocytes Relative 8 %   Monocytes Absolute 0.4 0.2 - 0.9 K/uL   Eosinophils Relative 4 %   Eosinophils Absolute 0.2 0 - 0.7 K/uL   Basophils Relative 1 %   Basophils Absolute 0.0 0 - 0.1 K/uL    Comment: Performed at [facility], [address], [place], [place] [address]    Radiology No results found.  Assessment/Plan  Hyperlipemia lipid control important in reducing the progression of atherosclerotic disease. Continue statin therapy   Type 2 diabetes mellitus with complication (HCC) blood glucose control important in reducing the progression of atherosclerotic disease. Also, involved in wound healing. On appropriate medications.   Hypertension blood pressure control important in reducing the progression of atherosclerotic disease. On appropriate oral medications.   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  Her celiac is likely chronically occluded.  Her SMA stent did have some elevated velocities when she was seen back in February, but at that time she was doing fairly well.  It is hard to discern whether or not she is having issues from an esophageal stricture versus chronic visceral disease at this point.  She is already scheduled for an endoscopy with possible dilatation in about 2 weeks.  I told her to keep that appointment.  She is scheduled for a repeat  mesenteric duplex in a few weeks in our office.  I think we will keep that study and see her back with that study to discuss the results and  determine further treatment options.  At that time, we can determine whether or not she would like to proceed with a mesenteric angiogram.    [name], MD  04/04/2018 3:56 PM    This note was created with Dragon medical transcription system.  Any errors from dictation are purely unintentional

## 2018-04-04 NOTE — Assessment & Plan Note (Signed)
lipid control important in reducing the progression of atherosclerotic disease. Continue statin therapy  

## 2018-04-04 NOTE — Assessment & Plan Note (Signed)
blood pressure control important in reducing the progression of atherosclerotic disease. On appropriate oral medications.  

## 2018-04-04 NOTE — Assessment & Plan Note (Signed)
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  Her celiac is likely chronically occluded.  Her SMA stent did have some elevated velocities when she was seen back in February, but at that time she was doing fairly well.  It is hard to discern whether or not she is having issues from an esophageal stricture versus chronic visceral disease at this point.  She is already scheduled for an endoscopy with possible dilatation in about 2 weeks.  I told her to keep that appointment.  She is scheduled for a repeat mesenteric duplex in a few weeks in our office.  I think we will keep that study and see her back with that study to discuss the results and determine further treatment options.  At that time, we can determine whether or not she would like to proceed with a mesenteric angiogram.

## 2018-04-04 NOTE — Assessment & Plan Note (Signed)
blood glucose control important in reducing the progression of atherosclerotic disease. Also, involved in wound healing. On appropriate medications.  

## 2018-04-15 ENCOUNTER — Encounter
Admission: RE | Disposition: A | Discharge status: Home / Self Care | Payer: Self-pay | Source: Ambulatory Visit | Attending: Gastroenterology

## 2018-04-15 ENCOUNTER — Ambulatory Visit: Payer: Medicare Other | Admitting: Anesthesiology

## 2018-04-15 ENCOUNTER — Ambulatory Visit
Admission: RE | Admit: 2018-04-15 | Discharge: 2018-04-15 | Disposition: A | Discharge status: Home / Self Care | Payer: Medicare Other | Source: Ambulatory Visit | Attending: Gastroenterology | Admitting: Gastroenterology

## 2018-04-15 ENCOUNTER — Encounter: Payer: Self-pay | Admitting: *Deleted

## 2018-04-15 DIAGNOSIS — Z79899 Other long term (current) drug therapy: Secondary | ICD-10-CM | POA: Insufficient documentation

## 2018-04-15 DIAGNOSIS — K209 Esophagitis, unspecified: Secondary | ICD-10-CM | POA: Diagnosis not present

## 2018-04-15 DIAGNOSIS — R234 Changes in skin texture: Secondary | ICD-10-CM | POA: Insufficient documentation

## 2018-04-15 DIAGNOSIS — I10 Essential (primary) hypertension: Secondary | ICD-10-CM | POA: Insufficient documentation

## 2018-04-15 DIAGNOSIS — E119 Type 2 diabetes mellitus without complications: Secondary | ICD-10-CM | POA: Insufficient documentation

## 2018-04-15 DIAGNOSIS — K219 Gastro-esophageal reflux disease without esophagitis: Secondary | ICD-10-CM | POA: Insufficient documentation

## 2018-04-15 DIAGNOSIS — Z7984 Long term (current) use of oral hypoglycemic drugs: Secondary | ICD-10-CM | POA: Diagnosis not present

## 2018-04-15 DIAGNOSIS — E785 Hyperlipidemia, unspecified: Secondary | ICD-10-CM | POA: Diagnosis not present

## 2018-04-15 DIAGNOSIS — K222 Esophageal obstruction: Secondary | ICD-10-CM | POA: Diagnosis not present

## 2018-04-15 HISTORY — PX: ESOPHAGOGASTRODUODENOSCOPY (EGD) WITH PROPOFOL: SHX5813

## 2018-04-15 HISTORY — DX: Personal history of urinary calculi: Z87.442

## 2018-04-15 LAB — GLUCOSE, CAPILLARY: Glucose-Capillary: 107 mg/dL — ABNORMAL HIGH (ref 70–99)

## 2018-04-15 SURGERY — ESOPHAGOGASTRODUODENOSCOPY (EGD) WITH PROPOFOL
Anesthesia: General

## 2018-04-15 MED ORDER — LIDOCAINE HCL (CARDIAC) PF 100 MG/5ML IV SOSY
PREFILLED_SYRINGE | INTRAVENOUS | Status: DC | PRN
  Administered 2018-04-15: 25 mg via INTRAVENOUS

## 2018-04-15 MED ORDER — PROPOFOL 500 MG/50ML IV EMUL
INTRAVENOUS | Status: AC
  Filled 2018-04-15: qty 50

## 2018-04-15 MED ORDER — PROPOFOL 10 MG/ML IV BOLUS
INTRAVENOUS | Status: DC | PRN
  Administered 2018-04-15: 10 mg via INTRAVENOUS
  Administered 2018-04-15: 20 mg via INTRAVENOUS
  Administered 2018-04-15: 50 mg via INTRAVENOUS

## 2018-04-15 MED ORDER — PROPOFOL 500 MG/50ML IV EMUL
INTRAVENOUS | Status: DC | PRN
  Administered 2018-04-15: 150 ug/kg/min via INTRAVENOUS

## 2018-04-15 MED ORDER — SODIUM CHLORIDE 0.9 % IV SOLN
INTRAVENOUS | Status: DC
  Administered 2018-04-15: 1000 mL via INTRAVENOUS

## 2018-04-15 MED ORDER — LIDOCAINE HCL (PF) 2 % IJ SOLN
INTRAMUSCULAR | Status: AC
  Filled 2018-04-15: qty 10

## 2018-04-15 NOTE — Transfer of Care (Signed)
Immediate Anesthesia Transfer of Care Note  Patient: [name] Day  Procedure(s) Performed: ESOPHAGOGASTRODUODENOSCOPY (EGD) WITH PROPOFOL (N/A )  Patient Location: PACU and Endoscopy Unit  Anesthesia Type:General  Level of Consciousness: awake  Airway & Oxygen Therapy: Patient Spontanous Breathing  Post-op Assessment: Report given to RN  Post vital signs: stable  Last Vitals:  Vitals Value Taken Time  BP 116/44 04/15/2018  9:24 AM  Temp 36.2 C 04/15/2018  9:24 AM  Pulse 64 04/15/2018  9:24 AM  Resp 15 04/15/2018  9:24 AM  SpO2 99 % 04/15/2018  9:24 AM    Last Pain:  Vitals:   04/15/18 0924  TempSrc:   PainSc: 0-No pain         Complications: No apparent anesthesia complications

## 2018-04-15 NOTE — H&P (Signed)
Outpatient short stay form Pre-procedure 04/15/2018 8:53 AM [name] Sails MD  Primary Physician: Dr [name]  Reason for visit: EGD  History of present illness: Patient is a 84 year old female presenting today as above.  She has a history of dysphagia that goes back several years.  She was diagnosed with CMV esophagitis and found to have a very fibrous appearing lesion in the distal esophagus on endoscopic ultrasound.  He has been treated for this CMV.  Her last serology was negative.  He is presenting today for further evaluation with biopsy and possible dilation.  I have discussed these findings at length with patient and her daughter.  Patient some weight has been relatively stable and she is using supplements.  Patient does take Plavix and has held that for a week.  She also does not take any aspirin or blood thinning agent otherwise.    Current Facility-Administered Medications:  .  0.9 %  sodium chloride infusion, , Intravenous, Continuous, [name] Sails, MD, Last Rate: 20 mL/hr at 04/15/18 1610  Medications Prior to Admission  Medication Sig Dispense Refill Last Dose  . acetaminophen (TYLENOL) 650 MG CR tablet Take 650 mg by mouth every 8 (eight) hours as needed for pain.   04/14/2018 at Unknown time  . allopurinol (ZYLOPRIM) 100 MG tablet Take 100 mg by mouth at bedtime.   0 04/14/2018 at Unknown time  . Bioflavonoid Products (VITAMIN C) CHEW Chew 2 tablets by mouth daily.   Past Week at Unknown time  . clopidogrel (PLAVIX) 75 MG tablet take 1 tablet by mouth once daily 30 tablet 11 Past Month at Unknown time  . dexlansoprazole (DEXILANT) 60 MG capsule Take by mouth.   04/14/2018 at Unknown time  . furosemide (LASIX) 40 MG tablet Take 40 mg by mouth 2 (two) times daily.   0 04/14/2018 at Unknown time  . glipiZIDE (GLUCOTROL) 5 MG tablet Take 5 mg by mouth daily before breakfast.   Past Week at Unknown time  . hydrALAZINE (APRESOLINE) 50 MG tablet Take 50 mg by mouth 2  (two) times daily.    04/14/2018 at Unknown time  . hydrochlorothiazide (HYDRODIURIL) 25 MG tablet Take 25 mg by mouth daily.  0 04/14/2018 at Unknown time  . Hypromellose (GENTEAL MILD) 0.2 % SOLN Place 1 drop into both eyes 3 (three) times daily.   Past Week at Unknown time  . lisinopril (PRINIVIL,ZESTRIL) 40 MG tablet Take 40 mg by mouth daily.   0 04/14/2018 at Unknown time  . metoprolol succinate (TOPROL-XL) 50 MG 24 hr tablet Take 75 mg by mouth 2 (two) times daily.   0 04/14/2018 at Unknown time  . rOPINIRole (REQUIP) 2 MG tablet Take 2 mg by mouth 2 (two) times daily.   0 04/14/2018 at Unknown time  . sucralfate (CARAFATE) 1 g tablet Take 1 g by mouth 3 (three) times daily as needed (for difficulty swallowing).   0 04/14/2018 at Unknown time  . vitamin B-12 (CYANOCOBALAMIN) 1000 MCG tablet Take 1,000 mcg by mouth daily.   Past Week at Unknown time  . dicyclomine (BENTYL) 10 MG capsule Take 10 mg by mouth 3 (three) times daily.   Taking  . pantoprazole (PROTONIX) 40 MG tablet Take 40 mg by mouth 2 (two) times daily.   Not Taking at Unknown time  . sertraline (ZOLOFT) 25 MG tablet Take 25 mg by mouth daily.    Not Taking at Unknown time  . valGANciclovir HCl (VALCYTE PO) Take 9 mLs  by mouth daily.   Not Taking at Unknown time     Allergies  Allergen Reactions  . Gabapentin Swelling  . Lipitor [Atorvastatin] Other (See Comments)    Muscle aches  . Mevacor [Lovastatin] Other (See Comments)    Muscle aches  . Milk-Related Compounds     Large quantities cause headaches   . Septra [Sulfamethoxazole-Trimethoprim]   . Statins Other (See Comments)    Muscle pain  . Sulfur Diarrhea and Nausea And Vomiting  . Zocor [Simvastatin] Other (See Comments)    Muscle aches     Past Medical History:  Diagnosis Date  . Anemia   . Aortic valve sclerosis   . Arrhythmia   . Arthritis    osteoarthritis  . Basal cell carcinoma of skin   . Brain tumor ([place])   . Brain tumor ([place])   . Cervical spine  disease   . Diabetes mellitus without complication ([place])   . Esophageal ulcer without bleeding   . GERD (gastroesophageal reflux disease)   . History of kidney stones   . Hyperlipemia   . Hypertension   . Kidney stones   . Leaky heart valve   . Meningioma ([place])   . Occlusive mesenteric ischemia ([place])   . Pulmonary hypertension (HCC)     Review of systems:      Physical Exam    Heart and lungs: Regular rate and rhythm without rub or gallop, lungs are bilaterally clear.    HEENT: Normocephalic atraumatic eyes are anicteric    Other:    Pertinant exam for procedure: Soft nontender nondistended bowel sounds positive normoactive.    Planned proceedures: EGD and indicated procedures. I have discussed the risks benefits and complications of procedures to include not limited to bleeding, infection, perforation and the risk of sedation and the patient wishes to proceed.    [name] Sails, MD Gastroenterology 04/15/2018  8:53 AM

## 2018-04-15 NOTE — Anesthesia Preprocedure Evaluation (Signed)
Anesthesia Evaluation  Patient identified by MRN, date of birth, ID band Patient awake    Reviewed: Allergy & Precautions, NPO status , Patient's Chart, lab work & pertinent test results  History of Anesthesia Complications Negative for: history of anesthetic complications  Airway Mallampati: III  TM Distance: >3 FB Neck ROM: Full    Dental no notable dental hx.    Pulmonary neg pulmonary ROS, neg sleep apnea, neg COPD,    breath sounds clear to auscultation- rhonchi (-) wheezing      Cardiovascular hypertension, Pt. on medications + Peripheral Vascular Disease  (-) CAD, (-) Past MI and (-) Cardiac Stents  Rhythm:Regular Rate:Normal - Systolic murmurs and - Diastolic murmurs Echo 10/20/39: MILD LV SYSTOLIC DYSFUNCTION WITH MILD LVH NORMAL RIGHT VENTRICULAR SYSTOLIC FUNCTION MODERATE VALVULAR REGURGITATION Aortic: MILD AR Mitral: MODERATE MR Tricuspid: MODERATE TR Contraction: REGIONALLY IMPAIRED Closest EF: 45% (Estimated) Inferior: HYPOCONTRACTILE Lateral: HYPOCONTRACTILE Posterior: HYPOCONTRACTILE   Neuro/Psych negative neurological ROS  negative psych ROS   GI/Hepatic Neg liver ROS, PUD, GERD  ,  Endo/Other  diabetes, Oral Hypoglycemic Agents  Renal/GU Renal disease: hx of nephrolithiasis.     Musculoskeletal negative musculoskeletal ROS (+)   Abdominal (+) - obese,   Peds  Hematology  (+) anemia ,   Anesthesia Other Findings Past Medical History: No date: Anemia No date: Arrhythmia No date: Basal cell carcinoma of skin No date: Brain tumor ([place]) No date: Brain tumor ([place]) No date: Cervical spine disease No date: Diabetes mellitus without complication (HCC) No date: Esophageal ulcer without bleeding No date: GERD (gastroesophageal reflux disease) No date: Hyperlipemia No date: Hypertension No date: Kidney stones No date: Leaky heart valve No date: Meningioma ([place])   Reproductive/Obstetrics                             Anesthesia Physical  Anesthesia Plan  ASA: III  Anesthesia Plan: General   Post-op Pain Management:    Induction: Intravenous  PONV Risk Score and Plan: 2 and Propofol infusion and TIVA  Airway Management Planned: Natural Airway and Nasal Cannula  Additional Equipment:   Intra-op Plan:   Post-operative Plan:   Informed Consent: I have reviewed the patients History and Physical, chart, labs and discussed the procedure including the risks, benefits and alternatives for the proposed anesthesia with the patient or authorized representative who has indicated his/her understanding and acceptance.   Dental advisory given  Plan Discussed with: CRNA and Anesthesiologist  Anesthesia Plan Comments:         Anesthesia Quick Evaluation

## 2018-04-15 NOTE — Anesthesia Post-op Follow-up Note (Signed)
Anesthesia QCDR form completed.        

## 2018-04-15 NOTE — Op Note (Signed)
[facility] Gastroenterology Patient Name: [name] Day Procedure Date: 04/15/2018 8:36 AM MRN: [id number] Account #: [id number] Date of Birth: 24-Feb-1929 Admit Type: Outpatient Age: 84 Room: [facility] ENDO ROOM 2 Gender: Female Note Status: Finalized Procedure:            Upper GI endoscopy Indications:          Dysphagia, Odynophagia. follow up CMV esophagitis. Providers:            Lollie Sails, MD Referring MD:         [name], MD (Referring MD) Medicines:            Monitored Anesthesia Care Complications:        No immediate complications. Procedure:            Pre-Anesthesia Assessment:                       - ASA Grade Assessment: III - A patient with severe                        systemic disease.                       After obtaining informed consent, the endoscope was                        passed under direct vision. Throughout the procedure,                        the patient's blood pressure, pulse, and oxygen                        saturations were monitored continuously. The Endoscope                        was introduced through the mouth, and advanced to the                        lower third of esophagus. The upper GI endoscopy was                        performed with moderate difficulty due to extrinsic                        compression and narrowing. The patient tolerated the                        procedure well. Findings:      A moderate-sized area of extrinsic compression was found in the mid       esophagus which does not impair movement of the scope.      One benign-appearing, intrinsic severe stenosis was found 40 cm from the       incisors. This stenosis measured 2 mm (inner diameter). The stenosis was       not traversed with a GIFH190 due to the narrowness. A GIFXP190 was       introduced and this also could not be place throught the stenosis. The       stenosis is high grade, but without overt evidence of inflamation  or       ulceration. There is a 360 degree very stiff fibrosis in  and above the       stenosis. The first scope was reintroduced and multiple biopsies were       taken . Impression:           - Extrinsic compression in the mid esophagus.                       - Benign-appearing esophageal stenosis.                       - No specimens collected. Recommendation:       - Discharge patient to home.                       - Will discuss further with Dr Francella Solian, will refer to                        tertiary institution for further management.                       - Continue present medications.                       - Continue current diet. Procedure Code(s):    --- Professional ---                       48546, Esophagoscopy, flexible, transoral; diagnostic,                        including collection of specimen(s) by brushing or                        washing, when performed (separate procedure) Diagnosis Code(s):    --- Professional ---                       K22.2, Esophageal obstruction                       R13.10, Dysphagia, unspecified CPT copyright 2017 American Medical Association. All rights reserved. The codes documented in this report are preliminary and upon coder review may  be revised to meet current compliance requirements. Lollie Sails, MD 04/15/2018 9:32:55 AM This report has been signed electronically. Number of Addenda: 0 Note Initiated On: 04/15/2018 8:36 AM Total Procedure Duration: 0 hours 19 minutes 47 seconds       [facility]

## 2018-04-15 NOTE — Anesthesia Postprocedure Evaluation (Signed)
Anesthesia Post Note  Patient: [name] Day  Procedure(s) Performed: ESOPHAGOGASTRODUODENOSCOPY (EGD) WITH PROPOFOL (N/A )  Patient location during evaluation: Endoscopy Anesthesia Type: General Level of consciousness: awake and alert Pain management: pain level controlled Vital Signs Assessment: post-procedure vital signs reviewed and stable Respiratory status: spontaneous breathing, nonlabored ventilation, respiratory function stable and patient connected to nasal cannula oxygen Cardiovascular status: blood pressure returned to baseline and stable Postop Assessment: no apparent nausea or vomiting Anesthetic complications: no     Last Vitals:  Vitals:   04/15/18 0934 04/15/18 0944  BP: (!) 121/45 (!) 139/53  Pulse: 64 69  Resp: 15 13  Temp:    SpO2: 100% 100%    Last Pain:  Vitals:   04/15/18 0944  TempSrc:   PainSc: 0-No pain                 Martha Clan

## 2018-04-16 ENCOUNTER — Encounter: Payer: Self-pay | Admitting: Gastroenterology

## 2018-04-16 LAB — SURGICAL PATHOLOGY

## 2018-05-05 DIAGNOSIS — K222 Esophageal obstruction: Secondary | ICD-10-CM | POA: Insufficient documentation

## 2018-05-06 ENCOUNTER — Ambulatory Visit (INDEPENDENT_AMBULATORY_CARE_PROVIDER_SITE_OTHER): Payer: Medicare Other

## 2018-05-06 ENCOUNTER — Ambulatory Visit (INDEPENDENT_AMBULATORY_CARE_PROVIDER_SITE_OTHER): Payer: Medicare Other | Admitting: Vascular Surgery

## 2018-05-06 ENCOUNTER — Encounter (INDEPENDENT_AMBULATORY_CARE_PROVIDER_SITE_OTHER): Payer: Self-pay | Admitting: Vascular Surgery

## 2018-05-06 VITALS — BP 192/61 | HR 64 | Resp 16 | Ht 62.5 in | Wt 112.8 lb

## 2018-05-06 DIAGNOSIS — B258 Other cytomegaloviral diseases: Secondary | ICD-10-CM

## 2018-05-06 DIAGNOSIS — E785 Hyperlipidemia, unspecified: Secondary | ICD-10-CM

## 2018-05-06 DIAGNOSIS — I10 Essential (primary) hypertension: Secondary | ICD-10-CM

## 2018-05-06 DIAGNOSIS — K559 Vascular disorder of intestine, unspecified: Secondary | ICD-10-CM | POA: Diagnosis not present

## 2018-05-06 DIAGNOSIS — E118 Type 2 diabetes mellitus with unspecified complications: Secondary | ICD-10-CM | POA: Diagnosis not present

## 2018-05-06 DIAGNOSIS — K208 Other esophagitis without bleeding: Secondary | ICD-10-CM

## 2018-05-06 NOTE — Assessment & Plan Note (Signed)
Has had to have repeated dilatations.  The stricture from this seems to be causing more of an issue that malperfusion, but certainly her visceral disease could be a contributing factor.

## 2018-05-06 NOTE — Progress Notes (Signed)
MRN : [id number]  [name] is a 84 y.o. ([birth year]) female who presents with chief complaint of  Chief Complaint  Patient presents with  . Follow-up    62month Mesentric  .  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  She has had an esophageal dilatation about a week ago which she says significantly helped her difficulty with eating and abdominal pain.  It did not resolve it.  She is scheduled for another esophageal dilatation in about 2 weeks.  She is still not gaining weight or eating all that well.  She does think that she is eating better since last week's dilatation. 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  She does have elevated velocities in her SMA stent although they have not progressed significantly from her previous study.  These elevated velocities could be consistent with a greater than 70% stenosis.  Current Outpatient Medications  Medication Sig Dispense Refill  . acetaminophen (TYLENOL) 650 MG CR tablet Take 650 mg by mouth every 8 (eight) hours as needed for pain.    Marland Kitchen allopurinol (ZYLOPRIM) 100 MG tablet Take 100 mg by mouth at bedtime.   0  . Bioflavonoid Products (VITAMIN C) CHEW Chew 2 tablets by mouth daily.    . cephALEXin (KEFLEX) 125 MG/5ML suspension   0  . clopidogrel (PLAVIX) 75 MG tablet take 1 tablet by mouth once daily 30 tablet 11  . dexlansoprazole (DEXILANT) 60 MG capsule Take by mouth.    . furosemide (LASIX) 40 MG tablet Take 40 mg by mouth 2 (two) times daily.   0  . glipiZIDE (GLUCOTROL) 5 MG tablet Take 5 mg by mouth daily before breakfast.    . hydrALAZINE (APRESOLINE) 50 MG tablet Take 50 mg by mouth 2 (two) times daily.     . hydrochlorothiazide (HYDRODIURIL) 25 MG tablet Take 25 mg by mouth daily.  0  . Hypromellose (GENTEAL MILD) 0.2 % SOLN Place 1 drop into both eyes 3 (three) times daily.    Marland Kitchen lisinopril (PRINIVIL,ZESTRIL) 40 MG tablet Take 40 mg by mouth daily.   0  .  metoprolol succinate (TOPROL-XL) 50 MG 24 hr tablet Take 75 mg by mouth 2 (two) times daily.   0  . pantoprazole (PROTONIX) 40 MG tablet Take 40 mg by mouth 2 (two) times daily.    Marland Kitchen rOPINIRole (REQUIP) 2 MG tablet Take 2 mg by mouth 2 (two) times daily.   0  . sertraline (ZOLOFT) 25 MG tablet Take 25 mg by mouth daily.     . vitamin B-12 (CYANOCOBALAMIN) 1000 MCG tablet Take 1,000 mcg by mouth daily.    Marland Kitchen dicyclomine (BENTYL) 10 MG capsule Take 10 mg by mouth 3 (three) times daily.    . sucralfate (CARAFATE) 1 g tablet Take 1 g by mouth 3 (three) times daily as needed (for difficulty swallowing).   0  . valGANciclovir HCl (VALCYTE PO) Take 9 mLs by mouth daily.     No current facility-administered medications for this visit.     Past Medical History:  Diagnosis Date  . Anemia   . Aortic valve sclerosis   . Arrhythmia   . Arthritis    osteoarthritis  . Basal cell carcinoma of skin   . Brain tumor ([place])   . Brain tumor ([place])   . Cervical spine disease   . Diabetes mellitus without complication ([place])   . Esophageal ulcer without bleeding   .  GERD (gastroesophageal reflux disease)   . History of kidney stones   . Hyperlipemia   . Hypertension   . Kidney stones   . Leaky heart valve   . Meningioma ([place])   . 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 Pulmonary hypertension ([place])     Past Surgical History:  Procedure Laterality Date  . ABDOMINAL HYSTERECTOMY    . APPENDECTOMY    . COLONOSCOPY WITH PROPOFOL N/A 06/20/2017   Procedure: COLONOSCOPY WITH PROPOFOL;  Surgeon: Lollie Sails, MD;  Location: [facility] ENDOSCOPY;  Service: Endoscopy;  Laterality: N/A;  . ESOPHAGOGASTRODUODENOSCOPY (EGD) WITH PROPOFOL N/A 03/14/2015   Procedure: ESOPHAGOGASTRODUODENOSCOPY (EGD) WITH PROPOFOL;  Surgeon: Josefine Class, MD;  Location: [facility] ENDOSCOPY;  Service: Endoscopy;  Laterality: N/A;  . ESOPHAGOGASTRODUODENOSCOPY (EGD) WITH PROPOFOL N/A 03/28/2017   Procedure: ESOPHAGOGASTRODUODENOSCOPY  (EGD) WITH PROPOFOL;  Surgeon: Lollie Sails, MD;  Location: [facility] ENDOSCOPY;  Service: Endoscopy;  Laterality: N/A;  . ESOPHAGOGASTRODUODENOSCOPY (EGD) WITH PROPOFOL N/A 06/20/2017   Procedure: ESOPHAGOGASTRODUODENOSCOPY (EGD) WITH PROPOFOL;  Surgeon: Lollie Sails, MD;  Location: [facility] ENDOSCOPY;  Service: Endoscopy;  Laterality: N/A;  . ESOPHAGOGASTRODUODENOSCOPY (EGD) WITH PROPOFOL N/A 10/21/2017   Procedure: ESOPHAGOGASTRODUODENOSCOPY (EGD) WITH PROPOFOL;  Surgeon: Lollie Sails, MD;  Location: [facility] ENDOSCOPY;  Service: Endoscopy;  Laterality: N/A;  . ESOPHAGOGASTRODUODENOSCOPY (EGD) WITH PROPOFOL N/A 04/15/2018   Procedure: ESOPHAGOGASTRODUODENOSCOPY (EGD) WITH PROPOFOL;  Surgeon: Lollie Sails, MD;  Location: [facility] ENDOSCOPY;  Service: Endoscopy;  Laterality: N/A;  . EYE SURGERY    . HYSTERECTOMY ABDOMINAL WITH SALPINGECTOMY    . PERIPHERAL VASCULAR CATHETERIZATION N/A 01/23/2016   Procedure: Visceral Venography;  Surgeon: Algernon Huxley, MD;  Location: [facility] CV LAB;  Service: Cardiovascular;  Laterality: N/A;  . PERIPHERAL VASCULAR CATHETERIZATION  01/23/2016   Procedure: Peripheral Vascular Intervention;  Surgeon: Algernon Huxley, MD;  Location: [place] CV LAB;  Service: Cardiovascular;;  . UPPER ESOPHAGEAL ENDOSCOPIC ULTRASOUND (EUS) N/A 12/05/2017   Procedure: UPPER ESOPHAGEAL ENDOSCOPIC ULTRASOUND (EUS);  Surgeon: Jola Schmidt, MD;  Location: [facility] ENDOSCOPY;  Service: Endoscopy;  Laterality: N/A;  . VISCERAL ANGIOGRAPHY N/A 03/18/2017   Procedure: Visceral Angiography;  Surgeon: Algernon Huxley, MD;  Location: [facility] CV LAB;  Service: Cardiovascular;  Laterality: N/A;  . VISCERAL ARTERY INTERVENTION N/A 03/18/2017   Procedure: Visceral Artery Intervention;  Surgeon: Algernon Huxley, MD;  Location: [place] CV LAB;  Service: Cardiovascular;  Laterality: N/A;   Social History  Substance Use Topics  . Smoking status: Never Smoker  . Smokeless tobacco: Never Used    . Alcohol use No    Family History      Family History  Problem Relation Age of Onset  . Stroke Mother   . Hypertension Mother   . Cancer Father   . Heart attack Sister   . Diabetes Sister   . Heart attack Brother          Allergies  Allergen Reactions  . Gabapentin Swelling  . Lipitor [Atorvastatin] Other (See Comments)    Muscle aches  . Mevacor [Lovastatin] Other (See Comments)    Muscle aches  . Milk-Related Compounds     Large quantities cause headaches   . Statins Other (See Comments)    Muscle pain  . Sulfur Diarrhea and Nausea And Vomiting  . Zocor [Simvastatin] Other (See Comments)    Muscle aches     REVIEW OF SYSTEMS(Negative unless checked)  Constitutional: [x] Weight loss[] Fever[] Chills Cardiac:[] Chest pain[] Chest pressure[] Palpitations [] Shortness of breath when  laying flat [] Shortness of breath at rest [] Shortness of breath with exertion. Vascular: [] Pain in legs with walking[] Pain in legsat rest[] Pain in legs when laying flat [] Claudication [] Pain in feet when walking [] Pain in feet at rest [] Pain in feet when laying flat [] History of DVT [] Phlebitis [] Swelling in legs [] Varicose veins [] Non-healing ulcers Pulmonary: [] Uses home oxygen [] Productive cough[] Hemoptysis [] Wheeze [] COPD [] Asthma Neurologic: [] Dizziness [] Blackouts [] Seizures [] History of stroke [] History of TIA[] Aphasia [] Temporary blindness[] Dysphagia [] Weaknessor numbness in arms [] Weakness or numbnessin legs Musculoskeletal: [x] Arthritis [] Joint swelling [] Joint pain [] Low back pain Hematologic:[] Easy bruising[] Easy bleeding [] Hypercoagulable state [] Anemic  Gastrointestinal:[] Blood in stool[] Vomiting blood[x] Gastroesophageal reflux/heartburn[x] Abdominal pain Genitourinary: [] Chronic kidney disease [] Difficulturination [] Frequenturination [] Burning with  urination[] Hematuria Skin: [] Rashes [] Ulcers [] Wounds Psychological: [] History of anxiety[] History of major depression.    Physical Examination  BP (!) 192/61 (BP Location: Right Arm)   Pulse 64   Resp 16   Ht 5' 2.5" (1.588 m)   Wt 112 lb 12.8 oz (51.2 kg)   BMI 20.30 kg/m  Gen: Frail and elderly, NAD Head: [place]/AT, + temporalis wasting. Ear/Nose/Throat: Hearing grossly intact, nares w/o erythema or drainage Eyes: Conjunctiva clear. Sclera non-icteric Neck: Supple.  Trachea midline Pulmonary:  Good air movement, no use of accessory muscles.  Cardiac: RRR, no JVD Vascular:  Vessel Right Left  Radial Palpable Palpable                                   Gastrointestinal: soft, non-tender/non-distended. No guarding/reflex.  Musculoskeletal: M/S 5/5 throughout.  No deformity or atrophy.  Neurologic: Sensation grossly intact in extremities.  Symmetrical.  Speech is fluent.  Psychiatric: Judgment intact, Mood & affect appropriate for pt's clinical situation. Dermatologic: No rashes or ulcers noted.  No cellulitis or open wounds.       Labs Recent Results (from the past 2160 hour(s))  CBC with Differential     Status: Abnormal   Collection Time: 02/19/18  2:42 PM  Result Value Ref Range   WBC 5.2 3.6 - 11.0 K/uL   RBC 3.79 (L) 3.80 - 5.20 MIL/uL   Hemoglobin 10.8 (L) 12.0 - 16.0 g/dL   HCT 32.8 (L) 35.0 - 47.0 %   MCV 86.7 80.0 - 100.0 fL   MCH 28.4 26.0 - 34.0 pg   MCHC 32.8 32.0 - 36.0 g/dL   RDW 19.9 (H) 11.5 - 14.5 %   Platelets 267 150 - 440 K/uL   Neutrophils Relative % 74 %   Neutro Abs 3.9 1.4 - 6.5 K/uL   Lymphocytes Relative 20 %   Lymphs Abs 1.0 1.0 - 3.6 K/uL   Monocytes Relative 3 %   Monocytes Absolute 0.2 0.2 - 0.9 K/uL   Eosinophils Relative 2 %   Eosinophils Absolute 0.1 0 - 0.7 K/uL   Basophils Relative 1 %   Basophils Absolute 0.1 0 - 0.1 K/uL    Comment: Performed at [facility], [address] [place], [place]  [address]  Ferritin     Status: None   Collection Time: 02/19/18  2:42 PM  Result Value Ref Range   Ferritin 57 11 - 307 ng/mL    Comment: Performed at [facility], [address] [place], [place] [address]  Ferritin     Status: None   Collection Time: 04/04/18 10:39 AM  Result Value Ref Range   Ferritin 72 11 - 307 ng/mL    Comment: Performed at [facility], [place],  [place], [place] [address]  CBC with Differential     Status: Abnormal   Collection Time: 04/04/18 10:39 AM  Result Value Ref Range   WBC 5.2 3.6 - 11.0 K/uL   RBC 3.79 (L) 3.80 - 5.20 MIL/uL   Hemoglobin 11.3 (L) 12.0 - 16.0 g/dL   HCT 33.6 (L) 35.0 - 47.0 %   MCV 88.4 80.0 - 100.0 fL   MCH 29.8 26.0 - 34.0 pg   MCHC 33.7 32.0 - 36.0 g/dL   RDW 16.6 (H) 11.5 - 14.5 %   Platelets 263 150 - 440 K/uL   Neutrophils Relative % 60 %   Neutro Abs 3.2 1.4 - 6.5 K/uL   Lymphocytes Relative 27 %   Lymphs Abs 1.4 1.0 - 3.6 K/uL   Monocytes Relative 8 %   Monocytes Absolute 0.4 0.2 - 0.9 K/uL   Eosinophils Relative 4 %   Eosinophils Absolute 0.2 0 - 0.7 K/uL   Basophils Relative 1 %   Basophils Absolute 0.0 0 - 0.1 K/uL    Comment: Performed at [facility], [address] [place], [place] [address]  Glucose, capillary     Status: Abnormal   Collection Time: 04/15/18  8:22 AM  Result Value Ref Range   Glucose-Capillary 107 (H) 70 - 99 mg/dL  Surgical pathology     Status: None   Collection Time: 04/15/18  9:11 AM  Result Value Ref Range   SURGICAL PATHOLOGY      Surgical Pathology CASE: [id number] PATIENT: [name] Surgical Pathology Report     SPECIMEN SUBMITTED: A. Esophagus, stenosis at 40 cm; cbx  CLINICAL HISTORY: None provided  PRE-OPERATIVE DIAGNOSIS: Esophagitis and dysphagia  POST-OPERATIVE DIAGNOSIS: Extrinsic compression at 25 cm, esophageal stenosis at 40 cm     DIAGNOSIS: A.  ESOPHAGUS, STENOSIS AT 40 CM; COLD BIOPSY: - DETACHED STRATIFIED  SQUAMOUS EPITHELIUM WITH FOCAL REACTIVE PARAKERATOSIS. - NEGATIVE FOR DYSPLASIA AND MALIGNANCY.   GROSS DESCRIPTION: A. Labeled: Esophageal stenosis 40 cm cbx Received: In formalin Tissue fragment(s): Multiple Size: Aggregate, 0.5 x 0.1 x 0.1 cm Description: Wispy tan fragments Entirely submitted in one cassette.   Final Diagnosis performed by Bryan Lemma, MD.   Electronically signed 04/16/2018 10:30:07AM The electronic signature indicates that the named Attending Pathologist has evaluated the specimen  Technical component performed at [place], [place], [place], [place] [address] Lab: [phone] Dir: Rush Farmer, MD, MMM  Professional component performed at [facility], [facility], [place], [place], [place] [address] Lab: [phone] Dir: Dellia Nims. Reuel Derby, MD     Radiology No results found.  Assessment/Plan Hyperlipemia lipid control important in reducing the progression of atherosclerotic disease. Continue statin therapy   Type 2 diabetes mellitus with complication (HCC) blood glucose control important in reducing the progression of atherosclerotic disease. Also, involved in wound healing. On appropriate medications.   Hypertension blood pressure control important in reducing the progression of atherosclerotic disease. On appropriate oral medications.   Esophagitis, CMV (HCC) Has had to have repeated dilatations.  The stricture from this seems to be causing more of an issue that malperfusion, but certainly her visceral disease could be a contributing factor.  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  She does have elevated velocities in her SMA stent although they have not progressed significantly from her previous study.  These elevated velocities could be consistent with a greater than 70% stenosis.  It is certainly possible that malperfusion is  contributing to her abdominal symptoms  although it appears that the esophageal stricture seems to be her main problem.  She does say that her last dilatation helped and she has another one scheduled for 2 weeks.  If she continues to have pain or problems after the next dilatation, I would strongly consider a mesenteric angiogram for further evaluation of her SMA stent and potential revascularization.  If she feels well and her symptoms are better, we will leave this alone and recheck it with duplex in 6 months.    [name], MD  05/06/2018 1:06 PM    This note was created with Dragon medical transcription system.  Any errors from dictation are purely unintentional

## 2018-05-06 NOTE — Assessment & Plan Note (Signed)
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  She does have elevated velocities in her SMA stent although they have not progressed significantly from her previous study.  These elevated velocities could be consistent with a greater than 70% stenosis.  It is certainly possible that malperfusion is contributing to her abdominal symptoms although it appears that the esophageal stricture seems to be her main problem.  She does say that her last dilatation helped and she has another one scheduled for 2 weeks.  If she continues to have pain or problems after the next dilatation, I would strongly consider a mesenteric angiogram for further evaluation of her SMA stent and potential revascularization.  If she feels well and her symptoms are better, we will leave this alone and recheck it with duplex in 6 months.

## 2018-05-21 ENCOUNTER — Inpatient Hospital Stay: Payer: Medicare Other | Attending: Hematology and Oncology

## 2018-05-21 DIAGNOSIS — R634 Abnormal weight loss: Secondary | ICD-10-CM | POA: Insufficient documentation

## 2018-05-21 DIAGNOSIS — K5730 Diverticulosis of large intestine without perforation or abscess without bleeding: Secondary | ICD-10-CM | POA: Diagnosis not present

## 2018-05-21 DIAGNOSIS — D509 Iron deficiency anemia, unspecified: Secondary | ICD-10-CM | POA: Insufficient documentation

## 2018-05-21 DIAGNOSIS — R531 Weakness: Secondary | ICD-10-CM | POA: Insufficient documentation

## 2018-05-21 DIAGNOSIS — K222 Esophageal obstruction: Secondary | ICD-10-CM | POA: Insufficient documentation

## 2018-05-21 DIAGNOSIS — D508 Other iron deficiency anemias: Secondary | ICD-10-CM

## 2018-05-21 LAB — CBC WITH DIFFERENTIAL/PLATELET
Basophils Absolute: 0 10*3/uL (ref 0–0.1)
Basophils Relative: 1 %
Eosinophils Absolute: 0.2 10*3/uL (ref 0–0.7)
Eosinophils Relative: 4 %
HCT: 32.6 % — ABNORMAL LOW (ref 35.0–47.0)
Hemoglobin: 11 g/dL — ABNORMAL LOW (ref 12.0–16.0)
Lymphocytes Relative: 19 %
Lymphs Abs: 1.2 10*3/uL (ref 1.0–3.6)
MCH: 29.9 pg (ref 26.0–34.0)
MCHC: 33.6 g/dL (ref 32.0–36.0)
MCV: 89.2 fL (ref 80.0–100.0)
Monocytes Absolute: 0.4 10*3/uL (ref 0.2–0.9)
Monocytes Relative: 6 %
Neutro Abs: 4.5 10*3/uL (ref 1.4–6.5)
Neutrophils Relative %: 70 %
Platelets: 261 10*3/uL (ref 150–440)
RBC: 3.66 MIL/uL — ABNORMAL LOW (ref 3.80–5.20)
RDW: 14.4 % (ref 11.5–14.5)
WBC: 6.4 10*3/uL (ref 3.6–11.0)

## 2018-05-21 LAB — FERRITIN: Ferritin: 41 ng/mL (ref 11–307)

## 2018-05-22 ENCOUNTER — Inpatient Hospital Stay: Payer: Medicare Other

## 2018-05-22 ENCOUNTER — Encounter: Payer: Self-pay | Admitting: Hematology and Oncology

## 2018-05-22 ENCOUNTER — Inpatient Hospital Stay (HOSPITAL_BASED_OUTPATIENT_CLINIC_OR_DEPARTMENT_OTHER): Payer: Medicare Other | Admitting: Hematology and Oncology

## 2018-05-22 VITALS — BP 172/61 | HR 67 | Temp 95.6°F | Resp 18 | Wt 117.3 lb

## 2018-05-22 DIAGNOSIS — R531 Weakness: Secondary | ICD-10-CM

## 2018-05-22 DIAGNOSIS — R634 Abnormal weight loss: Secondary | ICD-10-CM

## 2018-05-22 DIAGNOSIS — R1310 Dysphagia, unspecified: Secondary | ICD-10-CM

## 2018-05-22 DIAGNOSIS — K559 Vascular disorder of intestine, unspecified: Secondary | ICD-10-CM

## 2018-05-22 DIAGNOSIS — K5730 Diverticulosis of large intestine without perforation or abscess without bleeding: Secondary | ICD-10-CM

## 2018-05-22 DIAGNOSIS — D509 Iron deficiency anemia, unspecified: Secondary | ICD-10-CM

## 2018-05-22 DIAGNOSIS — K222 Esophageal obstruction: Secondary | ICD-10-CM

## 2018-05-22 DIAGNOSIS — K208 Other esophagitis without bleeding: Secondary | ICD-10-CM

## 2018-05-22 DIAGNOSIS — B258 Other cytomegaloviral diseases: Secondary | ICD-10-CM

## 2018-05-22 DIAGNOSIS — D508 Other iron deficiency anemias: Secondary | ICD-10-CM

## 2018-05-22 DIAGNOSIS — B259 Cytomegaloviral disease, unspecified: Secondary | ICD-10-CM

## 2018-05-22 NOTE — Progress Notes (Signed)
Patient is on liquid diet due to esophageal stenosis.  Just finished a round of Keppra for an infection she had in her leg. Offers no complaints today.

## 2018-05-22 NOTE — Progress Notes (Signed)
[facility] [name]: 05/22/2018   Chief Complaint: [name] is a 84 y.o. female with iron deficiency anemia who is seen for 4 month assessment.  HPI:   The patient was last seen in the hematology clinic on 11/21/2017.  At that time, she felt "ok".  Patient had swelling and weeping to her bilateral lower extremities. She had chronic dysphagia. She was scheduled for an EUS in March.  Exam was stable.  Hematocrit was 28.1 and hemoglobin 9.1. Platelets 313,000. Ferritin was 9.  She received Venofer x 5 (02/21, 04/11, 04/19, 04/25, and 02/20/2018).  CBCs have been followed: 01/23/2018:  Hematocrit 28.5, hemoglobin 9.4, and MCV 84.1. 02/19/2018:  Hematocrit 32.8, hemoglobin 10.8, and MCV 86.7. 04/04/2018:  Hematocrit 33.6, hemoglobin 11.3, and MCV 88.4. 05/21/2018:  Hematocrit 32.6, hemoglobin 11.0, and MC 89.2.  Ferritin has been followed: 13 on 01/02/2018, 57 on 02/19/2018, 72 on 04/04/2018, and 41 on 05/21/2018.  Upper EUS on 12/05/2017 by Dr Jola Schmidt revealed esophageal stenosis.  Biopsy revealed CMV viral cytopathic effect.  There was no evidence of malignancy.  The EUS scope could not be advanced into the stenosis as it was too tight.  There was  loss of normal wall layers and diffusely hypoechoic measuring from 3 mm to 6 mm in thickness. This could be inflammatory in nature but malignancy was also of concern. No extrinsic mass could be seen but exam was limited due inability to pass the scope through the stenosis.  She was referred to Dr. Linus Salmons at [facility] for management of CMV esophagitis.  She was treated with valacyclovir.  Notes indicate cost issus.  CMV DNA quantitative by PCR was positive on 01/02/2018 and < 200 IU/ml on 01/30/2018.  Upper GI endoscopy by Loistine Simas on 04/15/2018 revealed extrinsic compression in the mid esophagus.  Esophageal biopsy revealed detached stratified squamous epithelium with focal reactive  parakeratosis.  There was no dysplasia or malignancy.  There was a benign appearing esophageal stenosis.  Patient referred to tertiary institution for further management.  She underwent EGD with esophageal dilatation on 05/01/2018 by Dr. Ardelle Lesches and on 05/19/2018 by Dr. Dorris Singh at [facility].  She was seen by Dr. Lucky Cowboy on 05/06/2018 for follow-up of known mesenteric ischemia.  Notes reviewed.  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  She has elevated velocities in her SMA stent although they have not progressed significantly from her previous study.  These elevated velocities could be consistent with a greater than 70% stenosis.  The malperfusion could be contributing to her abdominal symptoms, although he felt that the esophageal stricture seemed to be her main problem.  If she continues to have pain or problems after the next dilatation, he strongly considered a mesenteric angiogram for further evaluation of her SMA stent and potential revascularization.  If she feels well and her symptoms are better, recheck with duplex is planned in 6 months.  During the interim, she notes that she took valacyclovir for 28 days.  She states that she only took part of the second course secondary to side effects.  She has not had another scope or biopsy.  Her daughter states that the "last 3 endoscopies can't transverse the 40 cm mark".  She states that she has had a total of 7 upper enddoscopies.  She states that she can drink shakes and baby food.  She is able to eat ground sausage.  She feels weak all of the time.  She had a little diarrhea today.  GI was called yesterday.  She has an appointment with Dr. Gustavo Lah on 05/30/2018.   Past Medical History:  Diagnosis Date  . Anemia   . Aortic valve sclerosis   . Arrhythmia   . Arthritis    osteoarthritis  . Basal cell carcinoma of skin   . Brain tumor ([place])   . Brain tumor ([place])   . Cervical spine disease   . Diabetes mellitus without  complication ([place])   . Esophageal ulcer without bleeding   . GERD (gastroesophageal reflux disease)   . History of kidney stones   . Hyperlipemia   . Hypertension   . Kidney stones   . Leaky heart valve   . Meningioma ([place])   . Occlusive mesenteric ischemia ([place])   . Pulmonary hypertension ([place])     Past Surgical History:  Procedure Laterality Date  . ABDOMINAL HYSTERECTOMY    . APPENDECTOMY    . COLONOSCOPY WITH PROPOFOL N/A 06/20/2017   Procedure: COLONOSCOPY WITH PROPOFOL;  Surgeon: Lollie Sails, MD;  Location: [facility] ENDOSCOPY;  Service: Endoscopy;  Laterality: N/A;  . ESOPHAGOGASTRODUODENOSCOPY (EGD) WITH PROPOFOL N/A 03/14/2015   Procedure: ESOPHAGOGASTRODUODENOSCOPY (EGD) WITH PROPOFOL;  Surgeon: Josefine Class, MD;  Location: [facility] ENDOSCOPY;  Service: Endoscopy;  Laterality: N/A;  . ESOPHAGOGASTRODUODENOSCOPY (EGD) WITH PROPOFOL N/A 03/28/2017   Procedure: ESOPHAGOGASTRODUODENOSCOPY (EGD) WITH PROPOFOL;  Surgeon: Lollie Sails, MD;  Location[facility] ENDOSCOPY;  Service: Endoscopy;  Laterality: N/A;  . ESOPHAGOGASTRODUODENOSCOPY (EGD) WITH PROPOFOL N/A 06/20/2017   Procedure: ESOPHAGOGASTRODUODENOSCOPY (EGD) WITH PROPOFOL;  Surgeon: Lollie Sails, MD;  Location: [facility] ENDOSCOPY;  Service: Endoscopy;  Laterality: N/A;  . ESOPHAGOGASTRODUODENOSCOPY (EGD) WITH PROPOFOL N/A 10/21/2017   Procedure: ESOPHAGOGASTRODUODENOSCOPY (EGD) WITH PROPOFOL;  Surgeon: Lollie Sails, MD;  Location: [facility] ENDOSCOPY;  Service: Endoscopy;  Laterality: N/A;  . ESOPHAGOGASTRODUODENOSCOPY (EGD) WITH PROPOFOL N/A 04/15/2018   Procedure: ESOPHAGOGASTRODUODENOSCOPY (EGD) WITH PROPOFOL;  Surgeon: Lollie Sails, MD;  Location: [facility] ENDOSCOPY;  Service: Endoscopy;  Laterality: N/A;  . EYE SURGERY    . HYSTERECTOMY ABDOMINAL WITH SALPINGECTOMY    . PERIPHERAL VASCULAR CATHETERIZATION N/A 01/23/2016   Procedure: Visceral Venography;  Surgeon: Algernon Huxley, MD;  Location: [place] CV LAB;   Service: Cardiovascular;  Laterality: N/A;  . PERIPHERAL VASCULAR CATHETERIZATION  01/23/2016   Procedure: Peripheral Vascular Intervention;  Surgeon: Algernon Huxley, MD;  Location: [place] CV LAB;  Service: Cardiovascular;;  . UPPER ESOPHAGEAL ENDOSCOPIC ULTRASOUND (EUS) N/A 12/05/2017   Procedure: UPPER ESOPHAGEAL ENDOSCOPIC ULTRASOUND (EUS);  Surgeon: Jola Schmidt, MD;  Location: [facility] ENDOSCOPY;  Service: Endoscopy;  Laterality: N/A;  . VISCERAL ANGIOGRAPHY N/A 03/18/2017   Procedure: Visceral Angiography;  Surgeon: Algernon Huxley, MD;  Location: [place] CV LAB;  Service: Cardiovascular;  Laterality: N/A;  . VISCERAL ARTERY INTERVENTION N/A 03/18/2017   Procedure: Visceral Artery Intervention;  Surgeon: Algernon Huxley, MD;  Location: [place] CV LAB;  Service: Cardiovascular;  Laterality: N/A;    Family History  Problem Relation Age of Onset  . Stroke Mother   . Hypertension Mother   . Cancer Father   . Heart attack Sister   . Diabetes Sister   . Heart attack Brother     Social History:  reports that she has never smoked. She has never used smokeless tobacco. She reports that she does not drink alcohol or use drugs.  She denies any exposure to radiation or toxins.  She is retired.  She  worked at [employer].  She lives in [place].  Patient's contact number is [phone].  Daughter's number is [phone] [name]).  The patient is accompanied by her daughter, [name], today.   Allergies:  Allergies  Allergen Reactions  . Gabapentin Swelling  . Lipitor [Atorvastatin] Other (See Comments)    Muscle aches  . Mevacor [Lovastatin] Other (See Comments)    Muscle aches  . Milk-Related Compounds     Large quantities cause headaches   . Septra [Sulfamethoxazole-Trimethoprim]   . Statins Other (See Comments)    Muscle pain  . Sulfur Diarrhea and Nausea And Vomiting  . Zocor [Simvastatin] Other (See Comments)    Muscle aches    Current Medications: Current  Outpatient Medications  Medication Sig Dispense Refill  . acetaminophen (TYLENOL) 650 MG CR tablet Take 650 mg by mouth every 8 (eight) hours as needed for pain.    Marland Kitchen allopurinol (ZYLOPRIM) 100 MG tablet Take 100 mg by mouth at bedtime.   0  . Bioflavonoid Products (VITAMIN C) CHEW Chew 2 tablets by mouth daily.    . clopidogrel (PLAVIX) 75 MG tablet take 1 tablet by mouth once daily 30 tablet 11  . dexlansoprazole (DEXILANT) 60 MG capsule Take by mouth.    . dicyclomine (BENTYL) 10 MG capsule Take 10 mg by mouth 3 (three) times daily.    . furosemide (LASIX) 40 MG tablet Take 40 mg by mouth 2 (two) times daily.   0  . glipiZIDE (GLUCOTROL) 5 MG tablet Take 5 mg by mouth daily before breakfast.    . hydrALAZINE (APRESOLINE) 50 MG tablet Take 50 mg by mouth 2 (two) times daily.     . hydrochlorothiazide (HYDRODIURIL) 25 MG tablet Take 25 mg by mouth daily.  0  . Hypromellose (GENTEAL MILD) 0.2 % SOLN Place 1 drop into both eyes 3 (three) times daily.    Marland Kitchen lisinopril (PRINIVIL,ZESTRIL) 40 MG tablet Take 40 mg by mouth daily.   0  . metoprolol succinate (TOPROL-XL) 50 MG 24 hr tablet Take 75 mg by mouth 2 (two) times daily.   0  . pantoprazole (PROTONIX) 40 MG tablet Take 40 mg by mouth 2 (two) times daily.    Marland Kitchen rOPINIRole (REQUIP) 2 MG tablet Take 2 mg by mouth 2 (two) times daily.   0  . sertraline (ZOLOFT) 25 MG tablet Take 25 mg by mouth daily.     . sucralfate (CARAFATE) 1 g tablet Take 1 g by mouth 3 (three) times daily as needed (for difficulty swallowing).   0  . valGANciclovir HCl (VALCYTE PO) Take 9 mLs by mouth daily.    . vitamin B-12 (CYANOCOBALAMIN) 1000 MCG tablet Take 1,000 mcg by mouth daily.     No current facility-administered medications for this visit.     Review of Systems:  GENERAL:  Feels "weak all of the time".  No fevers, sweats.  Weight down 3 pounds. PERFORMANCE STATUS (ECOG):  2-3 HEENT:  No visual changes, runny nose, sore throat, mouth sores or  tenderness. Lungs: No shortness of breath or cough.  No hemoptysis. Cardiac:  No chest pain, palpitations, orthopnea, or PND. GI:  Dysphagia.  Esophageal stenosis.  Little diarrhea.  No nausea, vomiting, constipation, melena or hematochezia. GU: No urgency, frequency, dysuria, or hematuria. Musculoskeletal:  No back pain.  Arthritis.  No muscle tenderness. Extremities:  No pain or swelling. Skin:  Dry skin.  No rashes or skin changes. Neuro:  No headache, numbness or weakness,  balance or coordination issues. Endocrine:  No diabetes.  Thyroid disease on Synthroid.  No hot flashes or night sweats. Psych:  No mood changes, depression or anxiety. Pain:  No focal pain. Review of systems:  All other systems reviewed and found to be negative.   Physical Exam: Blood pressure (!) 172/61, pulse 67, temperature (!) 95.6 F (35.3 C), temperature source Tympanic, resp. rate 18, weight 117 lb 5 oz (53.2 kg). GENERAL:  Thin elderly woman sitting comfortably in a wheelchair in the exam room in no acute distress. MENTAL STATUS:  Alert and oriented to person, place and time. HEAD: Short gray hair.  Normocephalic, atraumatic, face symmetric, no Cushingoid features. EYES:  Glasses.  Pupils equal round and reactive to light and accomodation.  No conjunctivitis or scleral icterus. ENT:  Oropharynx clear without lesion.  Tongue normal. Mucous membranes moist.  RESPIRATORY:  Clear to auscultation without rales, wheezes or rhonchi. CARDIOVASCULAR:  Regular rate and rhythm without murmur, rub or gallop. ABDOMEN:  Soft, non-tender, with active bowel sounds, and no hepatosplenomegaly.  No masses. SKIN:  No rashes, ulcers or lesions. EXTREMITIES: 2+ lower extremity edema.  No skin discoloration or tenderness.  No palpable cords. LYMPH NODES: No palpable cervical, supraclavicular, axillary or inguinal adenopathy  NEUROLOGICAL: Unremarkable. PSYCH:  Appropriate.    Appointment on 05/21/2018  Component Date Value  Ref Range Status  . Ferritin 05/21/2018 41  11 - 307 ng/mL Final   Performed at [facility], [address] [place], [place] [address]  . WBC 05/21/2018 6.4  3.6 - 11.0 K/uL Final  . RBC 05/21/2018 3.66* 3.80 - 5.20 MIL/uL Final  . Hemoglobin 05/21/2018 11.0* 12.0 - 16.0 g/dL Final  . HCT 05/21/2018 32.6* 35.0 - 47.0 % Final  . MCV 05/21/2018 89.2  80.0 - 100.0 fL Final  . MCH 05/21/2018 29.9  26.0 - 34.0 pg Final  . MCHC 05/21/2018 33.6  32.0 - 36.0 g/dL Final  . RDW 05/21/2018 14.4  11.5 - 14.5 % Final  . Platelets 05/21/2018 261  150 - 440 K/uL Final  . Neutrophils Relative % 05/21/2018 70  % Final  . Neutro Abs 05/21/2018 4.5  1.4 - 6.5 K/uL Final  . Lymphocytes Relative 05/21/2018 19  % Final  . Lymphs Abs 05/21/2018 1.2  1.0 - 3.6 K/uL Final  . Monocytes Relative 05/21/2018 6  % Final  . Monocytes Absolute 05/21/2018 0.4  0.2 - 0.9 K/uL Final  . Eosinophils Relative 05/21/2018 4  % Final  . Eosinophils Absolute 05/21/2018 0.2  0 - 0.7 K/uL Final  . Basophils Relative 05/21/2018 1  % Final  . Basophils Absolute 05/21/2018 0.0  0 - 0.1 K/uL Final   Performed at [facility], [address], [place], [place] [address]    Assessment:  LOUCILLE TAKACH [name] is a 84 y.o. female with iron deficiency anemia.  She has been on oral iron x 1 year with continued decline in her counts.  She has dysphagia and odynophagia.  She is on  PPI and carafate.  Diet is modest.  She denies pica.  Abdomen and pelvic CT on 02/19/2017 revealed no acute abnormality.  She has mild sigmoid diverticulosis.  CBC on 02/26/2017 revealed a hematocrit of 25.3, hemoglobin 8.0, and MCV 83.2.  Hemoccult studies x 1 were positive in 11/19/2016 and negative x 2 in 01/2017.  Ferritin was 5 with an iron saturation of 4%, and a TIBC of 487 on 01/30/2017.  Normal studies included:  creatinine (1.0), calcium, albumen, B12 (647), and TSH.  Folic acid was 44.9 on 08/09/2015.  Urinalysis revealed no blood on  01/23/2017.  She received Venofer weekly x 3 (02/28/2017 - 03/14/2017), x 2 (04/19/2017 - 04/26/2017), and x 2 (06/14/2017 and 06/21/2017).   Ferritin has been followed: 8 on 02/28/2017, 164 on 03/19/2017, 27 on 04/18/2017, 82 on 05/17/2017, 38 on 06/10/2017, 100 on 07/18/2017, 40 on 09/18/2017, 9 on 11/19/2017, 13 on 01/02/2018, 57 on 02/19/2018, 72 on 04/04/2018, and 41 on 05/21/2018.  EGD in 2016 revealed gastric ulcers and esophageal stricture which was dilated.  Barium swallow on 11/15/2015 was normal.  She has a history of mesenteric ischemia.  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  She was on Coumadin, and now on Plavix.  Colonoscopy in 2008 revealed diverticulosis.   EGD on 03/28/2017 revealed a moderate-sized area of extrinsic compression was found in the mid esophagus from about 25-28 cm, no apparent defect or abnormality in the mucosa or esophageal wall. There was a 2 cm cratered esophageal ulcer.  There was segmental moderate mucosal changes characterized by granularity at the gastroesophageal junction.  There was localized mild inflammation characterized by congestion (edema), depression and erythema was found on the greater curvature of the gastric body.  Biopsies revealed no H pylori, dysplasia or malignancy.  She is on pantoprazole and Carafate.  She has esophageal spasms improved with Bentyl.  EGD on 06/20/2017 revealed a non-bleeding esophageal ulcer, gastritis, and a normal duodenum.  Pathology revealed mild reactive gastropathy.  Colonoscopy on 06/20/2017 revealed non-bleeding external hemorrhoids, diverticulosis in the sigmoid colon, and one 1 mm polyp at the recto-sigmoid colon.  Pathology revealed a tubular adenoma  Negative for high grade dysplasia or malignancy.  Symptomatically, she feels weak.  She is unable to eat solid food.  She is losing weight.  Exam reveals bilateral lower extremity edema.  Hemoglobin is 11.  Ferritin is 41.  Plan: 1.   Review labs from  05/21/2018. 2.   Iron deficiency anemia:  Hemoglobin is slightly low.  Ferritin is adequate.  Discuss plans for close monitoring and reinitiation of IV iron when ferritin < 30. 3.  CMV esophagitis:  Patient s/p long course of valacyclovir  Anticipate re-evaluation with GI 3.  Weight loss:  Weight loss due to poor oral intake secondary to esophageal stenosis  Discuss re-evaluation by GI (appt on 05/30/2018)  Discuss supplemental caloric intake.  Discuss consideration of G tube (temporary or permanent) 4.  Esophageal stenosis:  Discuss chest CT to ensure no extrinsic compression of esophagus causing stenosis. 5.  Labs prior to chest CT:  BMP, B12, folate, TSH. 6.  Chest CT with contrast. 7.  RTC after chest CT for MD assessment and review of scan.   A total of (25) minutes of face-to-face time was spent with the patient with greater than 50% of that time in counseling and care-coordination.    Lequita Asal, MD  05/22/2018, 5:35 PM

## 2018-05-27 IMAGING — CT CT CTA ABD/PEL W/CM AND/OR W/O CM
3 of 9 series · 10 of 46 positions shown, 16 images · IV contrast (isovue)
Comparison: Abdominal CT 02/19/2017

CLINICAL DATA: Abdominal angina with known mesenteric disease.
Epigastric pain, worse today. Recent mesenteric angiography with
reported SMA stent was patient with minimal irregularity and
chronically occluded celiac artery.

EXAM:
CTA ABDOMEN AND PELVIS wITHOUT AND WITH CONTRAST
TECHNIQUE: Multidetector CT imaging of the abdomen and pelvis was performed
using the standard protocol during bolus administration of
intravenous contrast. Multiplanar reconstructed images and MIPs were
obtained and reviewed to evaluate the vascular anatomy.
CONTRAST:  100 cc Isovue 370 IV

[Series 4: axial arterial · axial · arterial · 0.67mm/px · z∈[-404,-362]mm · 2 of 210 slices shown]
[im 21/210  soft-tissue]
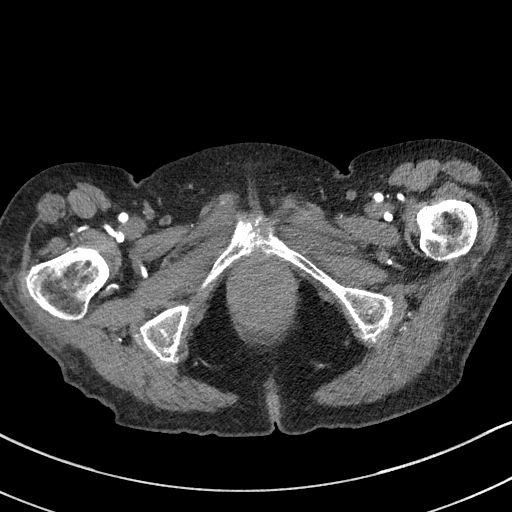
[im 42/210  soft-tissue]
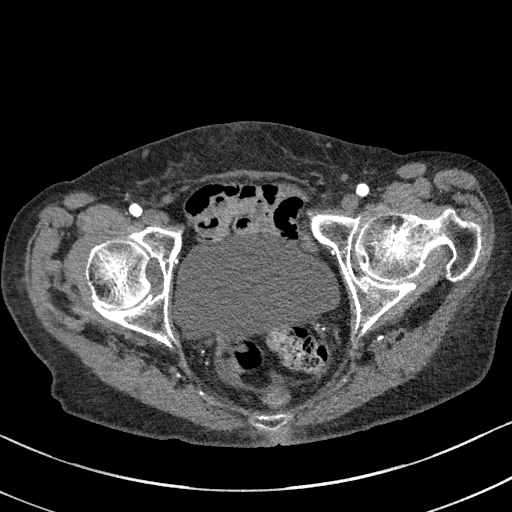

[Series 6: axial venous · axial · portal-venous · 0.67mm/px · z∈[-386,-86]mm · 6 of 84 slices shown, 11 images]
[im 12/84  soft-tissue]
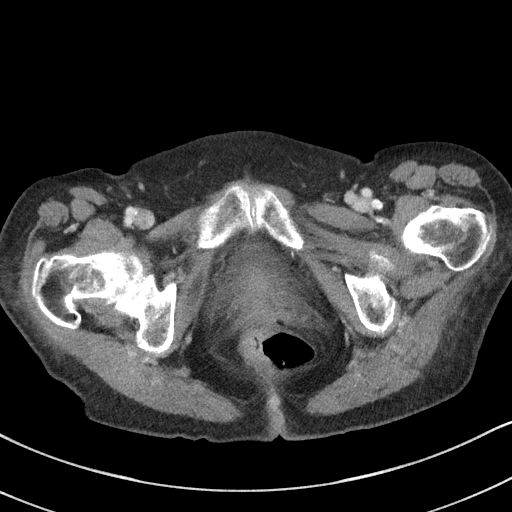
[im 12/84  bone]
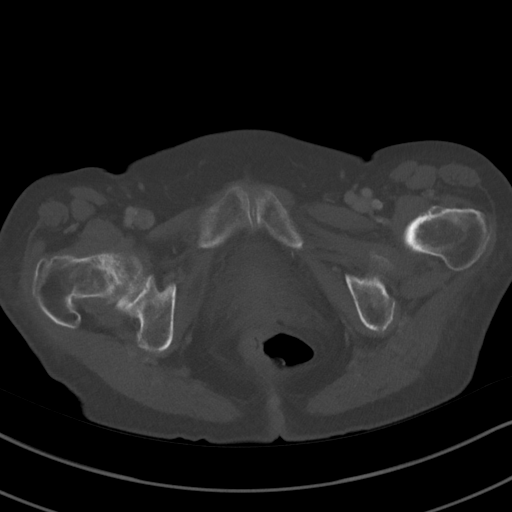
[im 24/84  soft-tissue]
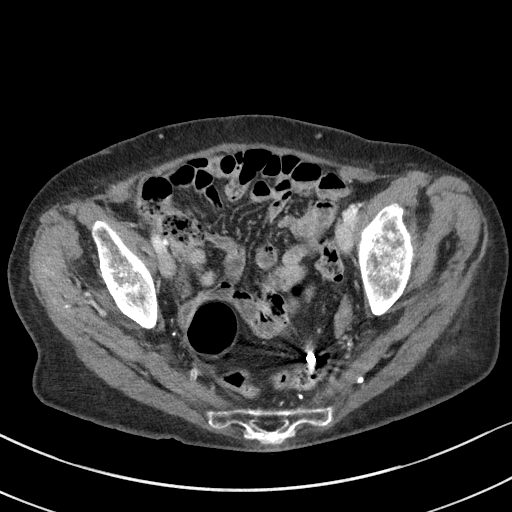
[im 36/84  soft-tissue]
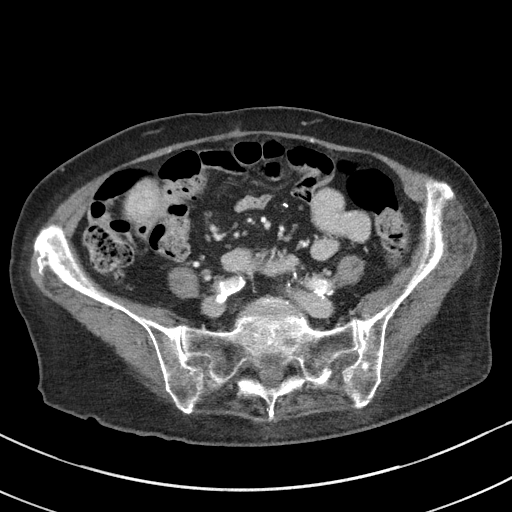
[im 36/84  lung]
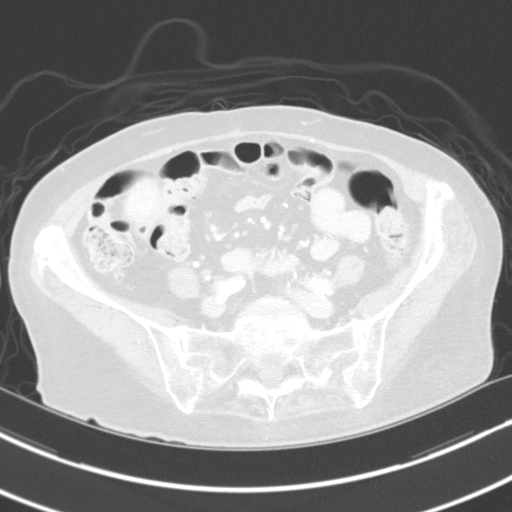
[im 48/84  soft-tissue]
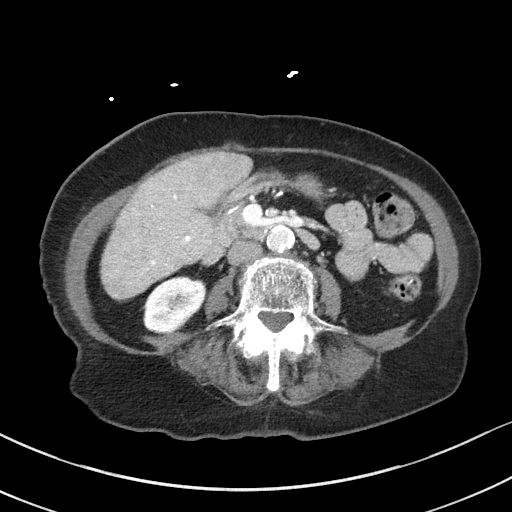
[im 48/84  lung]
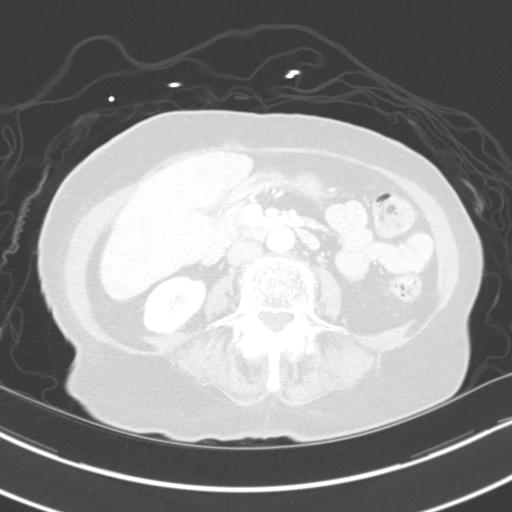
[im 60/84  soft-tissue]
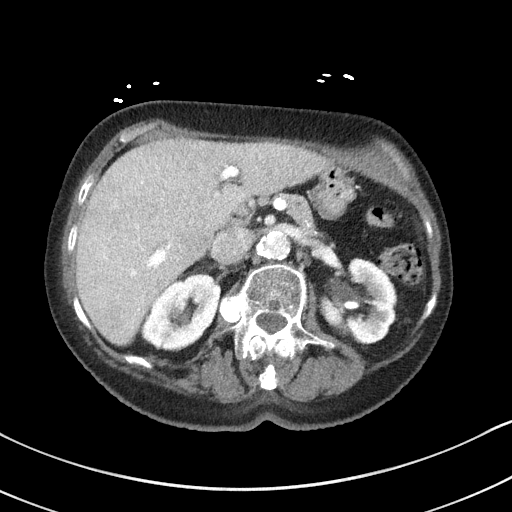
[im 60/84  lung]
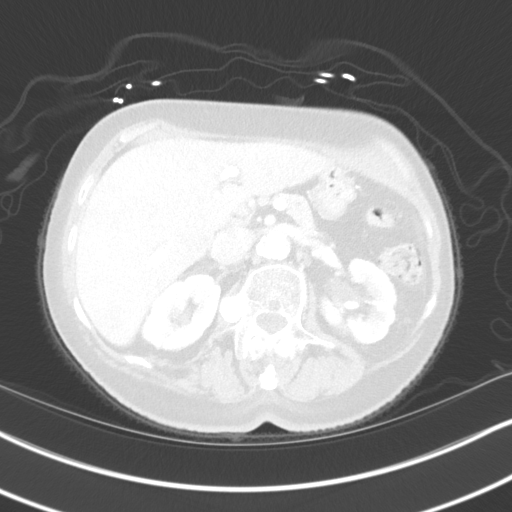
[im 72/84  soft-tissue]
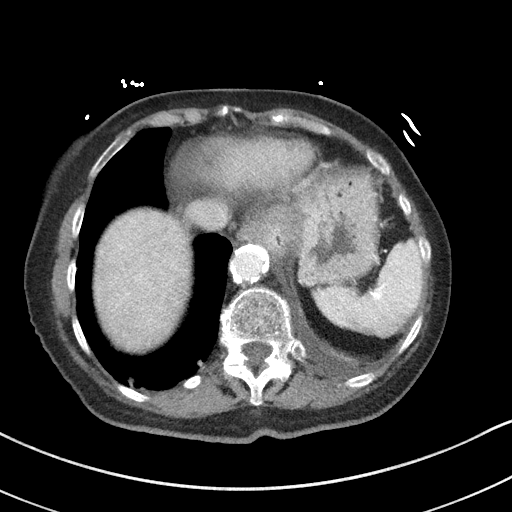
[im 72/84  lung]
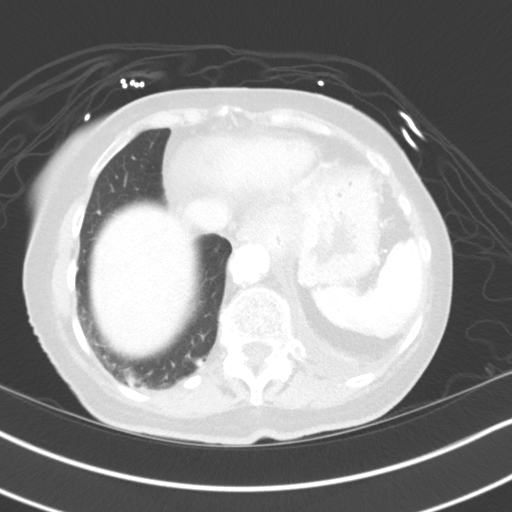

[Series 8: coronal mpr · coronal · 0.73mm/px · 2 of 110 slices shown, 3 images]
[im 37/110  soft-tissue]
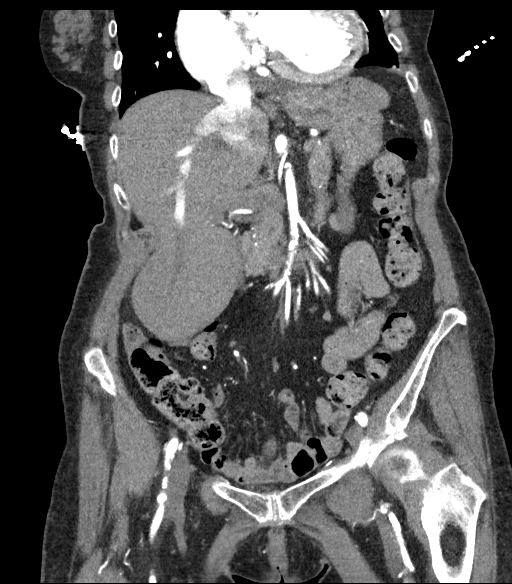
[im 37/110  bone]
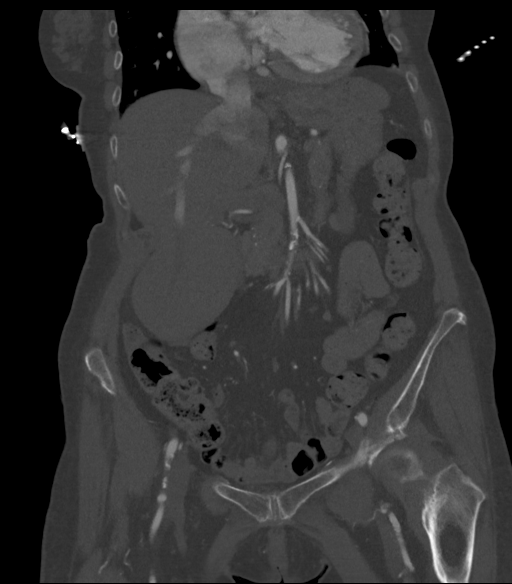
[im 73/110  soft-tissue]
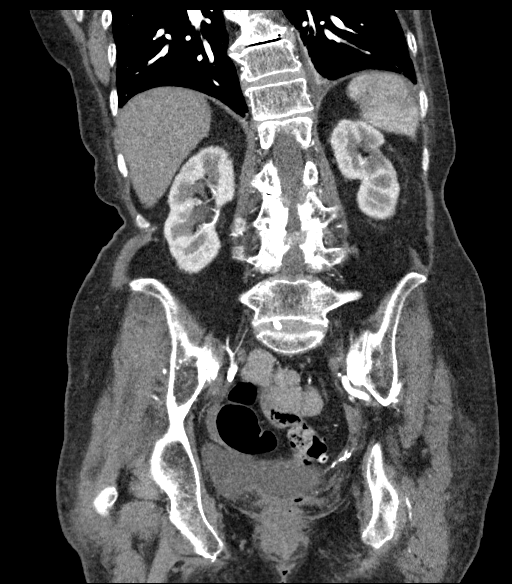

[10 of 46 positions shown; findings below may reference images not displayed]

FINDINGS: VASCULAR

Aorta: Diffuse atheromatous plaque. No occlusion, dissection or
aneurysm.

Celiac: High-grade stenosis/occlusion at the origin of the celiac
artery with poststenotic dilatation. Distal branches are patent with
atheromatous change. No abrupt occlusion.

SMA: Stent in the proximal SMA, no evidence of stent occlusion. Flow
within the stent is not well visualized by CTA. More distal SMA is
patent with atheromatous plaque distally extending into multiple
branches. No abrupt occlusion.

Renals: Stenosis at the origin of both renal arteries. There is neck
sirs rib renal artery to the lower left kidney.

IMA: Plaque at the origin but no occlusion. Atheromatous changes
distally.

Inflow: Atheromatous plaques without significant stenosis.
Chronically occluded right internal iliac artery at the origin with
distal reconstitution.

Proximal Outflow: Bilateral common femoral and visualized portions
of the superficial and profunda femoral arteries are patent without
evidence of aneurysm, dissection, vasculitis or significant
stenosis.

Veins: None venous filling defects, veins are grossly patent. Portal
and mesenteric veins appear patent. There is contrast refluxing into
the hepatic veins and IVC.

Review of the MIP images confirms the above findings.

NON-VASCULAR

Lower chest: Multi chamber cardiomegaly, with dilated right atrium
and ventricle, contrast refluxing into the hepatic veins and IVC.
Small left pleural effusion. Mild bibasilar atelectasis.

Hepatobiliary: No focal hepatic lesion. Small hyperenhancing focus
in the inferior left lobe of the liver is grossly unchanged.
Calcified gallstones within physiologically distended gallbladder.
No pericholecystic inflammation.

Pancreas: Parenchymal atrophy. No ductal dilatation or inflammation.

Spleen: Normal in size without focal abnormality.

Adrenals/Urinary Tract: No adrenal nodule. Nonobstructing stones in
the left kidney, 10 mm in the renal pelvis and 10 mm in the lower
pole. Fullness of both renal collecting systems, stable from prior
exam. No hydronephrosis or perinephric edema. There is homogeneous
renal enhancement. Cortical scarring in the upper left kidney.
Ureters are decompressed. Urinary bladder is physiologically
distended, no wall thickening.

Stomach/Bowel: Mild thickening the distal esophagus. Stomach is
otherwise decompressed. There is no bowel wall thickening or
inflammation. Mild sigmoid colonic diverticulosis without acute
inflammation. Appendix not visualized.

Lymphatic: No enlarged abdominopelvic lymph nodes.

Reproductive: Status post hysterectomy. No adnexal masses.

Other: Minimal free fluid in the pelvis is unchanged from prior
exam. No upper abdominal ascites. No free air.

Musculoskeletal: Degenerative change in the spine. There are no
acute or suspicious osseous abnormalities.
IMPRESSION: VASCULAR

1. Diffuse atheromatous plaque of the abdominal aorta and its
branches. No evidence of acute abnormality.
2. Stent at SMA origin without occlusion, flow seen distal to the
stent.
3. High-grade stenosis versus occlusion of the proximal celiac
artery (as reported on recent diagnostic angiogram) with post
stenotic dilatation.

NON-VASCULAR

1. Findings consistent with elevated right heart pressures with
contrast refluxing into the hepatic veins and IVC.
2. No bowel wall thickening or evidence of bowel inflammation.
3. Chronic findings include cholelithiasis, nonobstructing left
nephrolithiasis, and colonic diverticulosis without inflammation.

## 2018-05-28 ENCOUNTER — Inpatient Hospital Stay: Payer: Medicare Other

## 2018-05-28 ENCOUNTER — Other Ambulatory Visit: Payer: Self-pay

## 2018-05-28 ENCOUNTER — Ambulatory Visit: Admission: RE | Admit: 2018-05-28 | Payer: Medicare Other | Source: Ambulatory Visit

## 2018-05-28 ENCOUNTER — Ambulatory Visit
Admission: RE | Admit: 2018-05-28 | Discharge: 2018-05-28 | Disposition: A | Discharge status: Home / Self Care | Payer: Medicare Other | Source: Ambulatory Visit | Attending: Hematology and Oncology | Admitting: Hematology and Oncology

## 2018-05-28 DIAGNOSIS — R609 Edema, unspecified: Secondary | ICD-10-CM | POA: Insufficient documentation

## 2018-05-28 DIAGNOSIS — K228 Other specified diseases of esophagus: Secondary | ICD-10-CM | POA: Insufficient documentation

## 2018-05-28 DIAGNOSIS — K222 Esophageal obstruction: Secondary | ICD-10-CM | POA: Insufficient documentation

## 2018-05-28 DIAGNOSIS — R1310 Dysphagia, unspecified: Secondary | ICD-10-CM | POA: Diagnosis present

## 2018-05-28 DIAGNOSIS — D509 Iron deficiency anemia, unspecified: Secondary | ICD-10-CM | POA: Diagnosis not present

## 2018-05-28 DIAGNOSIS — I700 Atherosclerosis of aorta: Secondary | ICD-10-CM | POA: Diagnosis not present

## 2018-05-28 DIAGNOSIS — D508 Other iron deficiency anemias: Secondary | ICD-10-CM

## 2018-05-28 LAB — CBC WITH DIFFERENTIAL/PLATELET
Basophils Absolute: 0 10*3/uL (ref 0–0.1)
Basophils Relative: 1 %
Eosinophils Absolute: 0.2 10*3/uL (ref 0–0.7)
Eosinophils Relative: 3 %
HCT: 33.7 % — ABNORMAL LOW (ref 35.0–47.0)
Hemoglobin: 11.2 g/dL — ABNORMAL LOW (ref 12.0–16.0)
Lymphocytes Relative: 24 %
Lymphs Abs: 1.2 10*3/uL (ref 1.0–3.6)
MCH: 29.6 pg (ref 26.0–34.0)
MCHC: 33.2 g/dL (ref 32.0–36.0)
MCV: 89 fL (ref 80.0–100.0)
Monocytes Absolute: 0.4 10*3/uL (ref 0.2–0.9)
Monocytes Relative: 8 %
Neutro Abs: 3.2 10*3/uL (ref 1.4–6.5)
Neutrophils Relative %: 64 %
Platelets: 275 10*3/uL (ref 150–440)
RBC: 3.79 MIL/uL — ABNORMAL LOW (ref 3.80–5.20)
RDW: 14.7 % — ABNORMAL HIGH (ref 11.5–14.5)
WBC: 5 10*3/uL (ref 3.6–11.0)

## 2018-05-28 LAB — BASIC METABOLIC PANEL
Anion gap: 7 (ref 5–15)
BUN: 23 mg/dL (ref 8–23)
CO2: 32 mmol/L (ref 22–32)
Calcium: 9.1 mg/dL (ref 8.9–10.3)
Chloride: 105 mmol/L (ref 98–111)
Creatinine, Ser: 0.76 mg/dL (ref 0.44–1.00)
GFR calc Af Amer: >60 mL/min (ref >60)
GFR calc non Af Amer: >60 mL/min (ref >60)
Glucose, Bld: 141 mg/dL — ABNORMAL HIGH (ref 70–99)
Potassium: 4.1 mmol/L (ref 3.5–5.1)
Sodium: 144 mmol/L (ref 135–145)

## 2018-05-28 LAB — POCT I-STAT CREATININE: Creatinine, Ser: 0.8 mg/dL (ref 0.44–1.00)

## 2018-05-28 LAB — VITAMIN B12: Vitamin B-12: 401 pg/mL (ref 180–914)

## 2018-05-28 LAB — FOLATE: Folate: 31 ng/mL (ref >5.9)

## 2018-05-28 LAB — TSH: TSH: 2.557 u[IU]/mL (ref 0.350–4.500)

## 2018-05-28 MED ORDER — IOPAMIDOL (ISOVUE-300) INJECTION 61%
75.0000 mL | Freq: Once | INTRAVENOUS | Status: AC | PRN
  Administered 2018-05-28: 75 mL via INTRAVENOUS

## 2018-05-29 ENCOUNTER — Inpatient Hospital Stay: Payer: Medicare Other | Admitting: Hematology and Oncology

## 2018-05-29 ENCOUNTER — Telehealth: Payer: Self-pay

## 2018-05-29 ENCOUNTER — Encounter: Payer: Self-pay | Admitting: Hematology and Oncology

## 2018-05-29 VITALS — BP 138/50 | HR 58 | Temp 96.4°F | Resp 18 | Wt 116.0 lb

## 2018-05-29 DIAGNOSIS — R634 Abnormal weight loss: Secondary | ICD-10-CM | POA: Insufficient documentation

## 2018-05-29 DIAGNOSIS — K222 Esophageal obstruction: Secondary | ICD-10-CM

## 2018-05-29 DIAGNOSIS — R531 Weakness: Secondary | ICD-10-CM | POA: Diagnosis not present

## 2018-05-29 DIAGNOSIS — R1310 Dysphagia, unspecified: Secondary | ICD-10-CM | POA: Insufficient documentation

## 2018-05-29 DIAGNOSIS — K5730 Diverticulosis of large intestine without perforation or abscess without bleeding: Secondary | ICD-10-CM

## 2018-05-29 DIAGNOSIS — D509 Iron deficiency anemia, unspecified: Secondary | ICD-10-CM | POA: Diagnosis not present

## 2018-05-29 DIAGNOSIS — D508 Other iron deficiency anemias: Secondary | ICD-10-CM

## 2018-05-29 NOTE — Telephone Encounter (Signed)
Nutrition Follow-up:  Patient followed by Dr. Mike Gip for iron deficiency anemia.  Patient has history of CMV esophagitis, esophageal stenosis and has been unable to eat solid foods.    Called patient and spoke with her via phone this pm.  Patient reports that she could not drink the boost plus or ensure plus because it made her sugar run up to high. Patient has gone back to drinking glucerna shakes.  Still grinding foods had ground chicken with ranch dressing for lunch and bread with butter (mushed) and milkshake.  Reports eats very small amounts at a time and takes her about 1 hour to eat.  Reports that she meets with GI tomorrow to discuss about a feeding tube.    Reports that she has been eating homemade blackstrap molasses recently and thinks that is why she did not need iron this time.    Medications: reviewed  Labs: reviewed  Anthropometrics:   Weight increased to 116 lb from 114 lb in May.     NUTRITION DIAGNOSIS: Inadequate oral intake continues    INTERVENTION:  Encouraged patient to continue with grinding foods and adding sauces and gravies and additional condiments for added calories.   Agree with placement of feeding tube to improve nutrition.   Continue to recommend patient NOT restrict diet due to fear of elevated blood glucose.  Would recommend changing blood glucose medication to control blood glucose vs restricting intake of foods.   Patient has contact information from previous visit.  MONITORING, EVALUATION, GOAL: weight trends, intake   NEXT VISIT: October 10 following lab appointment  Evonda Enge B. Zenia Resides, [place], [place] Registered Dietitian [phone] (pager)

## 2018-05-29 NOTE — Progress Notes (Signed)
Patient here today for results.  Continues to have problems eating.  Food will not go down because of esophageal stenosis.

## 2018-05-29 NOTE — Progress Notes (Signed)
[facility] [name]: 05/29/2018   Chief Complaint: [name] is a 84 y.o. female with iron deficiency anemia who is seen for review of interval chest CT and discussion regarding direction of therapy.  HPI:   The patient was last seen in the hematology clinic on 05/22/2018.  At that time, she felt weak.  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  She had esophageal stenosis.  She was unable to eat solid foods.  She was losing weight.  Hemoglobin was 11.2.  Ferritin was 41.  At last visit, we discussed evaluation by GI (appointment 05/30/2018).  We discussed evaluation to r/o extrinsic compression.    Labs on 05/28/2018 revealed a hematocrit of 33.7, hemoglobin 11.2, and MCV 89.  B12 was 401.  Folate was 31.  TSH was 2.557.  Chest CT on 05/28/2018 revealed distal esophageal stricture at the GE junction with associated mild submucosal edema, possibly reflecting a post infectious/inflammatory stricture, underlying neoplasm not entirely excluded. Consider endoscopy for further evaluation as clinically warranted.  There was associated mild wall thickening involving the mid/distal esophagus, possibly reflecting esophagitis.  During the interim, she has been "about the same".  She describes emesis last night.  She has been eating thin soups, bisquit with butter.  She feels a "sticking life a knife" at times.  She sees GI Stephens November, NP) tomorrow.   Past Medical History:  Diagnosis Date  . Anemia   . Aortic valve sclerosis   . Arrhythmia   . Arthritis    osteoarthritis  . Basal cell carcinoma of skin   . Brain tumor ([place])   . Brain tumor ([place])   . Cervical spine disease   . Diabetes mellitus without complication ([place])   . Esophageal ulcer without bleeding   . GERD (gastroesophageal reflux disease)   . History of kidney stones   . Hyperlipemia   . Hypertension   . Kidney stones   . Leaky heart  valve   . Meningioma ([place])   . 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 Pulmonary hypertension ([place])     Past Surgical History:  Procedure Laterality Date  . ABDOMINAL HYSTERECTOMY    . APPENDECTOMY    . COLONOSCOPY WITH PROPOFOL N/A 06/20/2017   Procedure: COLONOSCOPY WITH PROPOFOL;  Surgeon: Lollie Sails, MD;  Location: [facility] ENDOSCOPY;  Service: Endoscopy;  Laterality: N/A;  . ESOPHAGOGASTRODUODENOSCOPY (EGD) WITH PROPOFOL N/A 03/14/2015   Procedure: ESOPHAGOGASTRODUODENOSCOPY (EGD) WITH PROPOFOL;  Surgeon: Josefine Class, MD;  Location: [facility] ENDOSCOPY;  Service: Endoscopy;  Laterality: N/A;  . ESOPHAGOGASTRODUODENOSCOPY (EGD) WITH PROPOFOL N/A 03/28/2017   Procedure: ESOPHAGOGASTRODUODENOSCOPY (EGD) WITH PROPOFOL;  Surgeon: Lollie Sails, MD;  Location: [facility] ENDOSCOPY;  Service: Endoscopy;  Laterality: N/A;  . ESOPHAGOGASTRODUODENOSCOPY (EGD) WITH PROPOFOL N/A 06/20/2017   Procedure: ESOPHAGOGASTRODUODENOSCOPY (EGD) WITH PROPOFOL;  Surgeon: Lollie Sails, MD;  Location: [facility] ENDOSCOPY;  Service: Endoscopy;  Laterality: N/A;  . ESOPHAGOGASTRODUODENOSCOPY (EGD) WITH PROPOFOL N/A 10/21/2017   Procedure: ESOPHAGOGASTRODUODENOSCOPY (EGD) WITH PROPOFOL;  Surgeon: Lollie Sails, MD;  Location: [facility] ENDOSCOPY;  Service: Endoscopy;  Laterality: N/A;  . ESOPHAGOGASTRODUODENOSCOPY (EGD) WITH PROPOFOL N/A 04/15/2018   Procedure: ESOPHAGOGASTRODUODENOSCOPY (EGD) WITH PROPOFOL;  Surgeon: Lollie Sails, MD;  Location: [facility] ENDOSCOPY;  Service: Endoscopy;  Laterality: N/A;  . EYE SURGERY    . HYSTERECTOMY ABDOMINAL WITH SALPINGECTOMY    . PERIPHERAL VASCULAR CATHETERIZATION N/A 01/23/2016   Procedure:  Visceral Venography;  Surgeon: Algernon Huxley, MD;  Location: [place] CV LAB;  Service: Cardiovascular;  Laterality: N/A;  . PERIPHERAL VASCULAR CATHETERIZATION  01/23/2016   Procedure: Peripheral Vascular Intervention;  Surgeon: Algernon Huxley, MD;  Location: [place] CV LAB;   Service: Cardiovascular;;  . UPPER ESOPHAGEAL ENDOSCOPIC ULTRASOUND (EUS) N/A 12/05/2017   Procedure: UPPER ESOPHAGEAL ENDOSCOPIC ULTRASOUND (EUS);  Surgeon: Jola Schmidt, MD;  Location: [facility] ENDOSCOPY;  Service: Endoscopy;  Laterality: N/A;  . VISCERAL ANGIOGRAPHY N/A 03/18/2017   Procedure: Visceral Angiography;  Surgeon: Algernon Huxley, MD;  Location: [place] CV LAB;  Service: Cardiovascular;  Laterality: N/A;  . VISCERAL ARTERY INTERVENTION N/A 03/18/2017   Procedure: Visceral Artery Intervention;  Surgeon: Algernon Huxley, MD;  Location: [place] CV LAB;  Service: Cardiovascular;  Laterality: N/A;    Family History  Problem Relation Age of Onset  . Stroke Mother   . Hypertension Mother   . Cancer Father   . Heart attack Sister   . Diabetes Sister   . Heart attack Brother     Social History:  reports that she has never smoked. She has never used smokeless tobacco. She reports that she does not drink alcohol or use drugs.  She denies any exposure to radiation or toxins.  She is retired.  She worked at [employer].  She lives in [place].  Patient's contact number is [phone].  Daughter's number is [phone] [name]).  The patient is accompanied by her daughter today.   Allergies:  Allergies  Allergen Reactions  . Gabapentin Swelling  . Lipitor [Atorvastatin] Other (See Comments)    Muscle aches  . Mevacor [Lovastatin] Other (See Comments)    Muscle aches  . Milk-Related Compounds     Large quantities cause headaches   . Septra [Sulfamethoxazole-Trimethoprim]   . Statins Other (See Comments)    Muscle pain  . Sulfur Diarrhea and Nausea And Vomiting  . Zocor [Simvastatin] Other (See Comments)    Muscle aches    Current Medications: Current Outpatient Medications  Medication Sig Dispense Refill  . acetaminophen (TYLENOL) 650 MG CR tablet Take 650 mg by mouth every 8 (eight) hours as needed for pain.    Marland Kitchen allopurinol (ZYLOPRIM) 100 MG tablet Take 100  mg by mouth at bedtime.   0  . Bioflavonoid Products (VITAMIN C) CHEW Chew 2 tablets by mouth daily.    . clopidogrel (PLAVIX) 75 MG tablet take 1 tablet by mouth once daily 30 tablet 11  . dexlansoprazole (DEXILANT) 60 MG capsule Take by mouth.    . furosemide (LASIX) 40 MG tablet Take 40 mg by mouth 2 (two) times daily.   0  . glipiZIDE (GLUCOTROL) 5 MG tablet Take 5 mg by mouth daily before breakfast.    . hydrALAZINE (APRESOLINE) 50 MG tablet Take 50 mg by mouth 2 (two) times daily.     . hydrochlorothiazide (HYDRODIURIL) 25 MG tablet Take 25 mg by mouth daily.  0  . Hypromellose (GENTEAL MILD) 0.2 % SOLN Place 1 drop into both eyes 3 (three) times daily.    Marland Kitchen lisinopril (PRINIVIL,ZESTRIL) 40 MG tablet Take 40 mg by mouth daily.   0  . metoprolol succinate (TOPROL-XL) 50 MG 24 hr tablet Take 75 mg by mouth 2 (two) times daily.   0  . pantoprazole (PROTONIX) 40 MG tablet Take 40 mg by mouth 2 (two) times daily.    Marland Kitchen rOPINIRole (REQUIP) 2 MG tablet Take 2 mg  by mouth 2 (two) times daily.   0  . sertraline (ZOLOFT) 25 MG tablet Take 25 mg by mouth daily.     . sucralfate (CARAFATE) 1 g tablet Take 1 g by mouth 3 (three) times daily as needed (for difficulty swallowing).   0  . valGANciclovir HCl (VALCYTE PO) Take 9 mLs by mouth daily.    . vitamin B-12 (CYANOCOBALAMIN) 1000 MCG tablet Take 1,000 mcg by mouth daily.    Marland Kitchen dicyclomine (BENTYL) 10 MG capsule Take 10 mg by mouth 3 (three) times daily.     No current facility-administered medications for this visit.     Review of Systems:  GENERAL:  Feels "about the same".  No fevers, sweats.  Weight down 1 pound. PERFORMANCE STATUS (ECOG):  1 HEENT:  Chronic visions changes.  No runny nose, sore throat, mouth sores or tenderness. Lungs: No shortness of breath or cough.  No hemoptysis. Cardiac:  No chest pain, palpitations, orthopnea, or PND. GI:  Dysphagia (see HPI).  Vomiting, last night.  No diarrhea, constipation, melena or  hematochezia. GU:  No urgency, frequency, dysuria, or hematuria. Musculoskeletal:  No back pain.  Chronic arthritis pain.  No muscle tenderness. Extremities:  No pain or swelling. Skin:  Dry skin.  No rashes or skin changes. Neuro:  No headache, numbness or weakness, balance or coordination issues. Endocrine:  No diabetes.  Thyroid disease on Synthroid".  No hot flashes or night sweats. Psych:  No mood changes, depression or anxiety. Pain:  No focal pain. Review of systems:  All other systems reviewed and found to be negative.   Physical Exam: Blood pressure (!) 138/50, pulse (!) 58, temperature (!) 96.4 F (35.8 C), temperature source Tympanic, resp. rate 18, weight 116 lb (52.6 kg). GENERAL:  Thin elderly woman sitting comfortably in the exam room in no acute distress. MENTAL STATUS:  Alert and oriented to person, place and time. HEAD:  Pearline Cables hair.  Normocephalic, atraumatic, face symmetric, no Cushingoid features. EYES:  Pupils equal round and reactive to light and accomodation.  No conjunctivitis or scleral icterus. ENT:  Oropharynx clear without lesion.  Tongue normal. Mucous membranes moist.  RESPIRATORY:  Clear to auscultation without rales, wheezes or rhonchi. CARDIOVASCULAR:  Regular rate and rhythm without murmur, rub or gallop. ABDOMEN:  Soft, non-tender, with active bowel sounds, and no hepatosplenomegaly.  No masses. SKIN:  No rashes, ulcers or lesions. EXTREMITIES: Chronic lower extremity changes.  No skin discoloration or tenderness.  No palpable cords. LYMPH NODES: No palpable cervical, supraclavicular, axillary or inguinal adenopathy  NEUROLOGICAL: Unremarkable. PSYCH:  Appropriate.    Hospital Outpatient Visit on 05/28/2018  Component Date Value Ref Range Status  . Creatinine, Ser 05/28/2018 0.80  0.44 - 1.00 mg/dL Final  Orders Only on 05/28/2018  Component Date Value Ref Range Status  . TSH 05/28/2018 2.557  0.350 - 4.500 uIU/mL Final   Comment: Performed by a  3rd Generation assay with a functional sensitivity of <=0.01 uIU/mL. Performed at [facility], [address] [place], [place] [address]   . Folate 05/28/2018 31.0  >5.9 ng/mL Final   Performed at [facility], [name] [place], [place] [address]  . Vitamin B-12 05/28/2018 401  180 - 914 pg/mL Final   Comment: (NOTE) This assay is not validated for testing neonatal or myeloproliferative syndrome specimens for Vitamin B12 levels. Performed at [facility], [place] [address] [place], [place] [address]   . Sodium 05/28/2018 144  135 -  145 mmol/L Final  . Potassium 05/28/2018 4.1  3.5 - 5.1 mmol/L Final  . Chloride 05/28/2018 105  98 - 111 mmol/L Final  . CO2 05/28/2018 32  22 - 32 mmol/L Final  . Glucose, Bld 05/28/2018 141* 70 - 99 mg/dL Final  . BUN 05/28/2018 23  8 - 23 mg/dL Final  . Creatinine, Ser 05/28/2018 0.76  0.44 - 1.00 mg/dL Final  . Calcium 05/28/2018 9.1  8.9 - 10.3 mg/dL Final  . GFR calc non Af Amer 05/28/2018 >60  >60 mL/min Final  . GFR calc Af Amer 05/28/2018 >60  >60 mL/min Final   Comment: (NOTE) The eGFR has been calculated using the CKD EPI equation. This calculation has not been validated in all clinical situations. eGFR's persistently <60 mL/min signify possible Chronic Kidney Disease.   Georgiann Hahn gap 05/28/2018 7  5 - 15 Final   Performed at [facility], [address] [place], [place] [address]  . WBC 05/28/2018 5.0  3.6 - 11.0 K/uL Final  . RBC 05/28/2018 3.79* 3.80 - 5.20 MIL/uL Final  . Hemoglobin 05/28/2018 11.2* 12.0 - 16.0 g/dL Final  . HCT 05/28/2018 33.7* 35.0 - 47.0 % Final  . MCV 05/28/2018 89.0  80.0 - 100.0 fL Final  . MCH 05/28/2018 29.6  26.0 - 34.0 pg Final  . MCHC 05/28/2018 33.2  32.0 - 36.0 g/dL Final  . RDW 05/28/2018 14.7* 11.5 - 14.5 % Final  . Platelets 05/28/2018 275  150 - 440 K/uL Final  . Neutrophils Relative % 05/28/2018 64  % Final  . Neutro Abs 05/28/2018 3.2  1.4 - 6.5 K/uL Final  .  Lymphocytes Relative 05/28/2018 24  % Final  . Lymphs Abs 05/28/2018 1.2  1.0 - 3.6 K/uL Final  . Monocytes Relative 05/28/2018 8  % Final  . Monocytes Absolute 05/28/2018 0.4  0.2 - 0.9 K/uL Final  . Eosinophils Relative 05/28/2018 3  % Final  . Eosinophils Absolute 05/28/2018 0.2  0 - 0.7 K/uL Final  . Basophils Relative 05/28/2018 1  % Final  . Basophils Absolute 05/28/2018 0.0  0 - 0.1 K/uL Final   Performed at [facility], [address], [place], [place] [address]    Assessment:  KALLE BERNATH [name] is a 84 y.o. female with iron deficiency anemia.  She has been on oral iron x 1 year with continued decline in her counts.  She has dysphagia and odynophagia.  She is on  PPI and carafate.  Diet is modest.  She denies pica.  Abdomen and pelvic CT on 02/19/2017 revealed no acute abnormality.  She has mild sigmoid diverticulosis.  CBC on 02/26/2017 revealed a hematocrit of 25.3, hemoglobin 8.0, and MCV 83.2.  Hemoccult studies x 1 were positive in 11/19/2016 and negative x 2 in 01/2017.  Ferritin was 5 with an iron saturation of 4%, and a TIBC of 487 on 01/30/2017.  Normal studies included:  creatinine (1.0), calcium, albumen, B12 (647), and TSH.  Folic acid was 62.8 on 08/09/2015.  Urinalysis revealed no blood on 01/23/2017.  She received Venofer weekly x 3 (02/28/2017 - 03/14/2017), x 2 (04/19/2017 - 04/26/2017), and x 2 (06/14/2017 and 06/21/2017).   Ferritin has been followed: 8 on 02/28/2017, 164 on 03/19/2017, 27 on 04/18/2017, 82 on 05/17/2017, 38 on 06/10/2017, 100 on 07/18/2017, 40 on 09/18/2017, 9 on 11/19/2017, 13 on 01/02/2018, 57 on 02/19/2018, 72 on 04/04/2018, and 41 on 05/21/2018.  EGD in 2016 revealed gastric ulcers and esophageal  stricture which was dilated.  Barium swallow on 11/15/2015 was normal.  She has a history of mesenteric ischemia.  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  She was on Coumadin, and now on Plavix.  Colonoscopy in 2008 revealed diverticulosis.    EGD on 03/28/2017 revealed a moderate-sized area of extrinsic compression was found in the mid esophagus from about 25-28 cm, no apparent defect or abnormality in the mucosa or esophageal wall. There was a 2 cm cratered esophageal ulcer.  There was segmental moderate mucosal changes characterized by granularity at the gastroesophageal junction.  There was localized mild inflammation characterized by congestion (edema), depression and erythema was found on the greater curvature of the gastric body.  Biopsies revealed no H pylori, dysplasia or malignancy.  She is on pantoprazole and Carafate.  She has esophageal spasms improved with Bentyl.  EGD on 06/20/2017 revealed a non-bleeding esophageal ulcer, gastritis, and a normal duodenum.  Pathology revealed mild reactive gastropathy.  Colonoscopy on 06/20/2017 revealed non-bleeding external hemorrhoids, diverticulosis in the sigmoid colon, and one 1 mm polyp at the recto-sigmoid colon.  Pathology revealed a tubular adenoma  Negative for high grade dysplasia or malignancy.  Upper EUS on 12/05/2017 revealed esophageal stenosis.  Biopsy revealed CMV viral cytopathic effect.  There was no evidence of malignancy.  The EUS scope could not be advanced into the stenosis as it was too tight.  There was  loss of normal wall layers and diffusely hypoechoic measuring from 3 mm to 6 mm in thickness.   She was treated with valacyclovir x 28 days.  CMV DNA quantitative by PCR was positive on 01/02/2018 and < 200 IU/ml on 01/30/2018.  Upper GI endoscopy on 04/15/2018 revealed extrinsic compression in the mid esophagus.  Esophageal biopsy revealed detached stratified squamous epithelium with focal reactive parakeratosis.  There was no dysplasia or malignancy.  There was a benign appearing esophageal stenosis.  She underwent EGD with esophageal dilatation on 05/01/2018 and 05/19/2018 at [facility].  Chest CT on 05/28/2018 revealed distal esophageal stricture at the GE junction  with associated mild submucosal edema, possibly reflecting a post infectious/inflammatory stricture, underlying neoplasm not entirely excluded. There was associated mild wall thickening involving the mid/distal esophagus, possibly reflecting esophagitis.  Symptomatically, she continues to have dysphagia.  Weight is down 1 pound.  Exam is stable.  Plan: 1.   Review labs from 05/28/2018.  B12, folate, and TSH are normal. 2.   Iron deficiency anemia:             Fematocrit is 33.7.  Hemoglobin is 11.2.  Ferritin was 41 on 05/21/2018.             Continue close monitoring and reinitiation of IV iron when ferritin < 30. 3.  CMV esophagitis:             Patient to be seen by GI tomorrow. 3.  Weight loss:             Weight loss secondary to esophageal stenosis.  Chest CT reveals no extrinsic compression on esophagus.  Discuss current caloric intake (thin soups).             Discuss planned follow-up with GI.             Discuss consideration of G tube (temporary or permanent). 4.  RTC in 6 weeks for labs (CBC with diff, ferritin). 5.  RTC in 3 months for MD assessment, labs (CBC with diff, ferritin-[name] before), and +/- Venofer.  Lequita Asal, MD  05/29/2018, 10:00 AM

## 2018-05-30 ENCOUNTER — Other Ambulatory Visit: Payer: Self-pay | Admitting: Gastroenterology

## 2018-05-30 DIAGNOSIS — R1319 Other dysphagia: Secondary | ICD-10-CM

## 2018-05-30 DIAGNOSIS — R1310 Dysphagia, unspecified: Secondary | ICD-10-CM

## 2018-06-06 ENCOUNTER — Ambulatory Visit: Admission: RE | Admit: 2018-06-06 | Payer: Medicare Other | Source: Ambulatory Visit

## 2018-06-10 ENCOUNTER — Other Ambulatory Visit: Payer: Self-pay | Admitting: Student

## 2018-06-11 ENCOUNTER — Other Ambulatory Visit: Payer: Self-pay

## 2018-06-11 ENCOUNTER — Inpatient Hospital Stay
Admission: AD | Admit: 2018-06-11 | Discharge: 2018-06-13 | DRG: 391 | Disposition: A | Discharge status: Home Health | Payer: Medicare Other | Source: Ambulatory Visit | Attending: Internal Medicine | Admitting: Internal Medicine

## 2018-06-11 ENCOUNTER — Ambulatory Visit: Admission: RE | Admit: 2018-06-11 | Payer: Medicare Other | Source: Ambulatory Visit

## 2018-06-11 ENCOUNTER — Ambulatory Visit
Admission: RE | Admit: 2018-06-11 | Discharge: 2018-06-11 | Disposition: A | Discharge status: Home / Self Care | Payer: Medicare Other | Source: Ambulatory Visit | Attending: Interventional Radiology | Admitting: Interventional Radiology

## 2018-06-11 DIAGNOSIS — Z90710 Acquired absence of both cervix and uterus: Secondary | ICD-10-CM

## 2018-06-11 DIAGNOSIS — Z833 Family history of diabetes mellitus: Secondary | ICD-10-CM | POA: Diagnosis not present

## 2018-06-11 DIAGNOSIS — I10 Essential (primary) hypertension: Secondary | ICD-10-CM | POA: Diagnosis present

## 2018-06-11 DIAGNOSIS — I272 Pulmonary hypertension, unspecified: Secondary | ICD-10-CM | POA: Diagnosis present

## 2018-06-11 DIAGNOSIS — Z7984 Long term (current) use of oral hypoglycemic drugs: Secondary | ICD-10-CM | POA: Diagnosis not present

## 2018-06-11 DIAGNOSIS — Z86011 Personal history of benign neoplasm of the brain: Secondary | ICD-10-CM

## 2018-06-11 DIAGNOSIS — R1314 Dysphagia, pharyngoesophageal phase: Secondary | ICD-10-CM | POA: Diagnosis present

## 2018-06-11 DIAGNOSIS — Z23 Encounter for immunization: Secondary | ICD-10-CM | POA: Diagnosis present

## 2018-06-11 DIAGNOSIS — K219 Gastro-esophageal reflux disease without esophagitis: Secondary | ICD-10-CM | POA: Diagnosis present

## 2018-06-11 DIAGNOSIS — Z6820 Body mass index (BMI) 20.0-20.9, adult: Secondary | ICD-10-CM | POA: Diagnosis not present

## 2018-06-11 DIAGNOSIS — Z79899 Other long term (current) drug therapy: Secondary | ICD-10-CM

## 2018-06-11 DIAGNOSIS — R1310 Dysphagia, unspecified: Secondary | ICD-10-CM

## 2018-06-11 DIAGNOSIS — E785 Hyperlipidemia, unspecified: Secondary | ICD-10-CM | POA: Diagnosis present

## 2018-06-11 DIAGNOSIS — Z85828 Personal history of other malignant neoplasm of skin: Secondary | ICD-10-CM | POA: Diagnosis not present

## 2018-06-11 DIAGNOSIS — R1319 Other dysphagia: Secondary | ICD-10-CM

## 2018-06-11 DIAGNOSIS — D649 Anemia, unspecified: Secondary | ICD-10-CM

## 2018-06-11 DIAGNOSIS — E43 Unspecified severe protein-calorie malnutrition: Secondary | ICD-10-CM | POA: Diagnosis present

## 2018-06-11 DIAGNOSIS — K222 Esophageal obstruction: Secondary | ICD-10-CM | POA: Diagnosis present

## 2018-06-11 DIAGNOSIS — E1151 Type 2 diabetes mellitus with diabetic peripheral angiopathy without gangrene: Secondary | ICD-10-CM | POA: Diagnosis present

## 2018-06-11 DIAGNOSIS — Z7902 Long term (current) use of antithrombotics/antiplatelets: Secondary | ICD-10-CM | POA: Diagnosis not present

## 2018-06-11 HISTORY — DX: Cardiac arrhythmia, unspecified: I49.9

## 2018-06-11 HISTORY — PX: IR GASTROSTOMY TUBE MOD SED: IMG625

## 2018-06-11 LAB — PROTIME-INR
INR: 0.95
Prothrombin Time: 12.6 seconds (ref 11.4–15.2)

## 2018-06-11 LAB — CBC
HCT: 34 % — ABNORMAL LOW (ref 35.0–47.0)
HCT: 35.1 % (ref 35.0–47.0)
Hemoglobin: 11.5 g/dL — ABNORMAL LOW (ref 12.0–16.0)
Hemoglobin: 11.6 g/dL — ABNORMAL LOW (ref 12.0–16.0)
MCH: 30 pg (ref 26.0–34.0)
MCH: 29.2 pg (ref 26.0–34.0)
MCHC: 33.8 g/dL (ref 32.0–36.0)
MCHC: 33 g/dL (ref 32.0–36.0)
MCV: 88.7 fL (ref 80.0–100.0)
MCV: 88.5 fL (ref 80.0–100.0)
Platelets: 277 10*3/uL (ref 150–440)
Platelets: 269 10*3/uL (ref 150–440)
RBC: 3.84 MIL/uL (ref 3.80–5.20)
RBC: 3.96 MIL/uL (ref 3.80–5.20)
RDW: 14.8 % — ABNORMAL HIGH (ref 11.5–14.5)
RDW: 14.2 % (ref 11.5–14.5)
WBC: 5.2 10*3/uL (ref 3.6–11.0)
WBC: 6.8 10*3/uL (ref 3.6–11.0)

## 2018-06-11 LAB — APTT: aPTT: 31 seconds (ref 24–36)

## 2018-06-11 LAB — CREATININE, SERUM
Creatinine, Ser: 0.61 mg/dL (ref 0.44–1.00)
GFR calc Af Amer: >60 mL/min (ref >60)
GFR calc non Af Amer: >60 mL/min (ref >60)

## 2018-06-11 LAB — GLUCOSE, CAPILLARY
Glucose-Capillary: 98 mg/dL (ref 70–99)
Glucose-Capillary: 102 mg/dL — ABNORMAL HIGH (ref 70–99)
Glucose-Capillary: 88 mg/dL (ref 70–99)

## 2018-06-11 MED ORDER — INFLUENZA VAC SPLIT HIGH-DOSE 0.5 ML IM SUSY
0.5000 mL | PREFILLED_SYRINGE | INTRAMUSCULAR | Status: AC
  Administered 2018-06-12: 0.5 mL via INTRAMUSCULAR
  Filled 2018-06-11 (×2): qty 0.5

## 2018-06-11 MED ORDER — OXYCODONE-ACETAMINOPHEN 5-325 MG PO TABS
ORAL_TABLET | ORAL | Status: AC
  Filled 2018-06-11: qty 1

## 2018-06-11 MED ORDER — ACETAMINOPHEN 325 MG PO TABS: 650.0000 mg | ORAL_TABLET | Freq: Four times a day (QID) | ORAL | Status: DC | PRN

## 2018-06-11 MED ORDER — INSULIN ASPART 100 UNIT/ML ~~LOC~~ SOLN
0.0000 [IU] | Freq: Three times a day (TID) | SUBCUTANEOUS | Status: DC
  Administered 2018-06-11: 2 [IU] via SUBCUTANEOUS

## 2018-06-11 MED ORDER — FENTANYL CITRATE (PF) 100 MCG/2ML IJ SOLN
INTRAMUSCULAR | Status: AC
  Filled 2018-06-11: qty 2

## 2018-06-11 MED ORDER — CEFAZOLIN SODIUM-DEXTROSE 2-4 GM/100ML-% IV SOLN
2.0000 g | INTRAVENOUS | Status: AC
  Administered 2018-06-11: 2 g via INTRAVENOUS

## 2018-06-11 MED ORDER — ONDANSETRON HCL 4 MG/2ML IJ SOLN
4.0000 mg | Freq: Four times a day (QID) | INTRAMUSCULAR | Status: DC | PRN
  Administered 2018-06-11 – 2018-06-13 (×3): 4 mg via INTRAVENOUS
  Filled 2018-06-11 (×4): qty 2

## 2018-06-11 MED ORDER — FENTANYL CITRATE (PF) 100 MCG/2ML IJ SOLN
INTRAMUSCULAR | Status: AC | PRN
  Administered 2018-06-11: 50 ug via INTRAVENOUS
  Administered 2018-06-11 (×3): 25 ug via INTRAVENOUS

## 2018-06-11 MED ORDER — DOCUSATE SODIUM 100 MG PO CAPS: 100.0000 mg | ORAL_CAPSULE | Freq: Two times a day (BID) | ORAL | Status: DC | PRN

## 2018-06-11 MED ORDER — POLYVINYL ALCOHOL 1.4 % OP SOLN
1.0000 [drp] | Freq: Three times a day (TID) | OPHTHALMIC | Status: DC
  Administered 2018-06-11 – 2018-06-13 (×6): 1 [drp] via OPHTHALMIC
  Filled 2018-06-11: qty 15

## 2018-06-11 MED ORDER — GLUCAGON HCL RDNA (DIAGNOSTIC) 1 MG IJ SOLR
INTRAMUSCULAR | Status: AC
  Filled 2018-06-11: qty 1

## 2018-06-11 MED ORDER — IOPAMIDOL (ISOVUE-300) INJECTION 61%
30.0000 mL | Freq: Once | INTRAVENOUS | Status: AC | PRN
  Administered 2018-06-11: 20 mL via INTRAVENOUS

## 2018-06-11 MED ORDER — SODIUM CHLORIDE 0.9 % IV SOLN
INTRAVENOUS | Status: DC
  Administered 2018-06-11: 17:00:00 via INTRAVENOUS

## 2018-06-11 MED ORDER — ACETAMINOPHEN ER 650 MG PO TBCR: 650.0000 mg | EXTENDED_RELEASE_TABLET | Freq: Three times a day (TID) | ORAL | Status: DC | PRN

## 2018-06-11 MED ORDER — INSULIN ASPART 100 UNIT/ML ~~LOC~~ SOLN
SUBCUTANEOUS | Status: AC
  Filled 2018-06-11: qty 1

## 2018-06-11 MED ORDER — OXYCODONE-ACETAMINOPHEN 5-325 MG PO TABS
1.0000 | ORAL_TABLET | Freq: Once | ORAL | Status: AC
  Administered 2018-06-11: 1 via ORAL

## 2018-06-11 MED ORDER — MIDAZOLAM HCL 2 MG/2ML IJ SOLN
INTRAMUSCULAR | Status: AC | PRN
  Administered 2018-06-11 (×2): 1 mg via INTRAVENOUS

## 2018-06-11 MED ORDER — HYDRALAZINE HCL 20 MG/ML IJ SOLN
10.0000 mg | Freq: Four times a day (QID) | INTRAMUSCULAR | Status: DC | PRN
  Administered 2018-06-11: 10 mg via INTRAVENOUS
  Filled 2018-06-11: qty 1

## 2018-06-11 MED ORDER — HEPARIN SODIUM (PORCINE) 5000 UNIT/ML IJ SOLN
5000.0000 [IU] | Freq: Three times a day (TID) | INTRAMUSCULAR | Status: DC
  Administered 2018-06-11 – 2018-06-13 (×5): 5000 [IU] via SUBCUTANEOUS
  Filled 2018-06-11 (×5): qty 1

## 2018-06-11 MED ORDER — MIDAZOLAM HCL 2 MG/2ML IJ SOLN
INTRAMUSCULAR | Status: AC
  Filled 2018-06-11: qty 2

## 2018-06-11 MED ORDER — LIDOCAINE-EPINEPHRINE (PF) 1 %-1:200000 IJ SOLN
INTRAMUSCULAR | Status: AC | PRN
  Administered 2018-06-11: 12 mL

## 2018-06-11 MED ORDER — ROPINIROLE HCL 1 MG PO TABS
2.0000 mg | ORAL_TABLET | Freq: Two times a day (BID) | ORAL | Status: DC
  Administered 2018-06-11 – 2018-06-12 (×2): 2 mg via ORAL
  Filled 2018-06-11 (×2): qty 2

## 2018-06-11 MED ORDER — GLUCAGON HCL (RDNA) 1 MG IJ SOLR
INTRAMUSCULAR | Status: AC | PRN
  Administered 2018-06-11: 1 mg via INTRAVENOUS

## 2018-06-11 MED ORDER — LIDOCAINE-EPINEPHRINE (PF) 1 %-1:200000 IJ SOLN
INTRAMUSCULAR | Status: AC
  Filled 2018-06-11: qty 30

## 2018-06-11 MED ORDER — SODIUM CHLORIDE 0.9 % IV SOLN
INTRAVENOUS | Status: DC
  Administered 2018-06-11: 12:00:00 via INTRAVENOUS

## 2018-06-11 NOTE — H&P (Signed)
[facility] NAME: [name]    MR#:  [id number]  DATE OF BIRTH:  April 25, 1929  DATE OF ADMISSION:  06/11/2018  PRIMARY CARE PHYSICIAN: [name], MD   REQUESTING/REFERRING PHYSICIAN: Skulski  CHIEF COMPLAINT:  No chief complaint on file.   HISTORY OF PRESENT ILLNESS: [name]  is a 84 y.o. female with a known history of anemia, aortic valve sclerosis, arrhythmia, basal cell cancer of skin, diabetes, esophageal ulcer without bleeding, hyperlipidemia, hypertension, pulmonary hypertension, meningioma-has difficulty swallowing food ongoing for last 1 to 2 years.  She had esophageal stretching tried twice at [facility] but it reoccurs in few months with worsening swallowing difficulty. Currently for the last few months she has not been able to swallow any solids she has to cross her pills and her food and has been losing weight. So her GI physician has suggested to get a PEG tube placement and then admit to hospital for initiation of tube feed and assessment of her nutritional requirements. PEG tube is placed by radiologist and we will called to admit the patient after the procedure.  PAST MEDICAL HISTORY:   Past Medical History:  Diagnosis Date  . Anemia   . Aortic valve sclerosis   . Arrhythmia   . Arthritis    osteoarthritis  . Basal cell carcinoma of skin   . Brain tumor ([place])   . Brain tumor ([place])   . Cervical spine disease   . Diabetes mellitus without complication ([place])   . Dysrhythmia    sinus arrhythmia  . Esophageal ulcer without bleeding   . GERD (gastroesophageal reflux disease)   . History of kidney stones   . Hyperlipemia   . Hypertension   . Kidney stones   . Leaky heart valve   . Meningioma ([place])   . 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 Pulmonary hypertension ([place])     PAST SURGICAL HISTORY:  Past Surgical History:  Procedure Laterality Date  . ABDOMINAL HYSTERECTOMY    .  APPENDECTOMY    . ARTHROGRAM KNEE Left   . COLONOSCOPY WITH PROPOFOL N/A 06/20/2017   Procedure: COLONOSCOPY WITH PROPOFOL;  Surgeon: Lollie Sails, MD;  Location: [facility] ENDOSCOPY;  Service: Endoscopy;  Laterality: N/A;  . ESOPHAGOGASTRODUODENOSCOPY (EGD) WITH PROPOFOL N/A 03/14/2015   Procedure: ESOPHAGOGASTRODUODENOSCOPY (EGD) WITH PROPOFOL;  Surgeon: Josefine Class, MD;  Location: [facility] ENDOSCOPY;  Service: Endoscopy;  Laterality: N/A;  . ESOPHAGOGASTRODUODENOSCOPY (EGD) WITH PROPOFOL N/A 03/28/2017   Procedure: ESOPHAGOGASTRODUODENOSCOPY (EGD) WITH PROPOFOL;  Surgeon: Lollie Sails, MD;  Location: [facility] ENDOSCOPY;  Service: Endoscopy;  Laterality: N/A;  . ESOPHAGOGASTRODUODENOSCOPY (EGD) WITH PROPOFOL N/A 06/20/2017   Procedure: ESOPHAGOGASTRODUODENOSCOPY (EGD) WITH PROPOFOL;  Surgeon: Lollie Sails, MD;  Location: [facility] ENDOSCOPY;  Service: Endoscopy;  Laterality: N/A;  . ESOPHAGOGASTRODUODENOSCOPY (EGD) WITH PROPOFOL N/A 10/21/2017   Procedure: ESOPHAGOGASTRODUODENOSCOPY (EGD) WITH PROPOFOL;  Surgeon: Lollie Sails, MD;  Location: [facility] ENDOSCOPY;  Service: Endoscopy;  Laterality: N/A;  . ESOPHAGOGASTRODUODENOSCOPY (EGD) WITH PROPOFOL N/A 04/15/2018   Procedure: ESOPHAGOGASTRODUODENOSCOPY (EGD) WITH PROPOFOL;  Surgeon: Lollie Sails, MD;  Location: [facility] ENDOSCOPY;  Service: Endoscopy;  Laterality: N/A;  . EYE SURGERY    . HYSTERECTOMY ABDOMINAL WITH SALPINGECTOMY    . IR GASTROSTOMY TUBE MOD SED  06/11/2018  . PERIPHERAL VASCULAR CATHETERIZATION N/A 01/23/2016   Procedure: Visceral Venography;  Surgeon: Algernon Huxley, MD;  Location: [place] CV LAB;  Service: Cardiovascular;  Laterality: N/A;  .  PERIPHERAL VASCULAR CATHETERIZATION  01/23/2016   Procedure: Peripheral Vascular Intervention;  Surgeon: Algernon Huxley, MD;  Location: [place] CV LAB;  Service: Cardiovascular;;  . UPPER ESOPHAGEAL ENDOSCOPIC ULTRASOUND (EUS) N/A 12/05/2017   Procedure: UPPER ESOPHAGEAL  ENDOSCOPIC ULTRASOUND (EUS);  Surgeon: Jola Schmidt, MD;  Location: [facility] ENDOSCOPY;  Service: Endoscopy;  Laterality: N/A;  . VISCERAL ANGIOGRAPHY N/A 03/18/2017   Procedure: Visceral Angiography;  Surgeon: Algernon Huxley, MD;  Location: [place] CV LAB;  Service: Cardiovascular;  Laterality: N/A;  . VISCERAL ARTERY INTERVENTION N/A 03/18/2017   Procedure: Visceral Artery Intervention;  Surgeon: Algernon Huxley, MD;  Location: [place] CV LAB;  Service: Cardiovascular;  Laterality: N/A;    SOCIAL HISTORY:  Social History   Tobacco Use  . Smoking status: Never Smoker  . Smokeless tobacco: Never Used  Substance Use Topics  . Alcohol use: Never    Frequency: Never    FAMILY HISTORY:  Family History  Problem Relation Age of Onset  . Stroke Mother   . Hypertension Mother   . Cancer Father   . Heart attack Sister   . Diabetes Sister   . Heart attack Brother     DRUG ALLERGIES:  Allergies  Allergen Reactions  . Gabapentin Swelling  . Lipitor [Atorvastatin] Other (See Comments)    Muscle aches  . Mevacor [Lovastatin] Other (See Comments)    Muscle aches  . Milk-Related Compounds     Large quantities cause headaches   . Septra [Sulfamethoxazole-Trimethoprim]   . Statins Other (See Comments)    Muscle pain  . Sulfur Diarrhea and Nausea And Vomiting  . Zocor [Simvastatin] Other (See Comments)    Muscle aches    REVIEW OF SYSTEMS:   CONSTITUTIONAL: No fever,have fatigue or weakness.  EYES: No blurred or double vision.  EARS, NOSE, AND THROAT: No tinnitus or ear pain.  RESPIRATORY: No cough, shortness of breath, wheezing or hemoptysis.  CARDIOVASCULAR: No chest pain, orthopnea, edema.  GASTROINTESTINAL: No nausea, vomiting, diarrhea or abdominal pain.  GENITOURINARY: No dysuria, hematuria.  ENDOCRINE: No polyuria, nocturia,  HEMATOLOGY: No anemia, easy bruising or bleeding SKIN: No rash or lesion. MUSCULOSKELETAL: No joint pain or arthritis.   NEUROLOGIC: No  tingling, numbness, weakness.  PSYCHIATRY: No anxiety or depression.   MEDICATIONS AT HOME:  Prior to Admission medications   Medication Sig Start Date End Date Taking? Authorizing Provider  acetaminophen (TYLENOL) 650 MG CR tablet Take 650 mg by mouth every 8 (eight) hours as needed for pain.   Yes [provider]  Hypromellose (GENTEAL MILD) 0.2 % SOLN Place 1 drop into both eyes 3 (three) times daily.   Yes [provider]  allopurinol (ZYLOPRIM) 100 MG tablet Take 100 mg by mouth at bedtime.  02/15/17   [provider]  Bioflavonoid Products (VITAMIN C) CHEW Chew 2 tablets by mouth daily.    [provider]  clopidogrel (PLAVIX) 75 MG tablet take 1 tablet by mouth once daily 01/28/17   Algernon Huxley, MD  dexlansoprazole (DEXILANT) 60 MG capsule Take by mouth. 03/11/18   [provider]  dicyclomine (BENTYL) 10 MG capsule Take 10 mg by mouth 3 (three) times daily. 04/09/17 05/22/18  [provider]  furosemide (LASIX) 40 MG tablet Take 40 mg by mouth 2 (two) times daily.  01/03/15   [provider]  glipiZIDE (GLUCOTROL) 5 MG tablet Take 5 mg by mouth daily before breakfast.    [provider]  hydrALAZINE (APRESOLINE) 50  MG tablet Take 50 mg by mouth 2 (two) times daily.  10/08/16   [provider]  hydrochlorothiazide (HYDRODIURIL) 25 MG tablet Take 25 mg by mouth daily. 02/25/17   [provider]  lisinopril (PRINIVIL,ZESTRIL) 40 MG tablet Take 40 mg by mouth daily.  03/01/15   [provider]  metoprolol succinate (TOPROL-XL) 50 MG 24 hr tablet Take 75 mg by mouth 2 (two) times daily.  02/24/15   [provider]  pantoprazole (PROTONIX) 40 MG tablet Take 40 mg by mouth 2 (two) times daily. 02/19/17   [provider]  rOPINIRole (REQUIP) 2 MG tablet Take 2 mg by mouth 2 (two) times daily.  02/24/15   [provider]  sertraline (ZOLOFT) 25 MG tablet Take 25 mg by mouth daily.   06/06/17 06/06/18  [provider]  sucralfate (CARAFATE) 1 g tablet Take 1 g by mouth 3 (three) times daily as needed (for difficulty swallowing).  01/22/17   [provider]  valGANciclovir HCl (VALCYTE PO) Take 9 mLs by mouth daily.    [provider]  vitamin B-12 (CYANOCOBALAMIN) 1000 MCG tablet Take 1,000 mcg by mouth daily.    [provider]      PHYSICAL EXAMINATION:   VITAL SIGNS: Blood pressure (!) 173/60, pulse 72, temperature 98.7 F (37.1 C), temperature source Oral, resp. rate 18, height 5' 3.5" (1.613 m), SpO2 100 %.  GENERAL:  84 y.o.-year-old patient lying in the bed with no acute distress.  EYES: Pupils equal, round, reactive to light and accommodation. No scleral icterus. Extraocular muscles intact.  HEENT: Head atraumatic, normocephalic. Oropharynx and nasopharynx clear.  NECK:  Supple, no jugular venous distention. No thyroid enlargement, no tenderness.  LUNGS: Normal breath sounds bilaterally, no wheezing, rales,rhonchi or crepitation. No use of accessory muscles of respiration.  CARDIOVASCULAR: S1, S2 normal. No murmurs, rubs, or gallops.  ABDOMEN: Soft, nontender, nondistended. Bowel sounds present. No organomegaly or mass. PEG in place. EXTREMITIES: No pedal edema, cyanosis, or clubbing.  NEUROLOGIC: Cranial nerves II through XII are intact. Muscle strength 4/5 in all extremities. Sensation intact. Gait not checked.  PSYCHIATRIC: The patient is alert and oriented x 3.  SKIN: No obvious rash, lesion, or ulcer.   LABORATORY PANEL:   CBC Recent Labs  Lab 06/11/18 1114  WBC 5.2  HGB 11.5*  HCT 34.0*  PLT 277  MCV 88.7  MCH 30.0  MCHC 33.8  RDW 14.8*   ------------------------------------------------------------------------------------------------------------------  Chemistries  No results for input(s): NA, K, CL, CO2, GLUCOSE, BUN, CREATININE, CALCIUM, MG, AST, ALT, ALKPHOS, BILITOT in the last 168 hours.  Invalid  input(s): GFRCGP ------------------------------------------------------------------------------------------------------------------ estimated creatinine clearance is 41.9 mL/min (by C-G formula based on SCr of 0.8 mg/dL). ------------------------------------------------------------------------------------------------------------------ No results for input(s): TSH, T4TOTAL, T3FREE, THYROIDAB in the last 72 hours.  Invalid input(s): FREET3   Coagulation profile Recent Labs  Lab 06/11/18 1114  INR 0.95   ------------------------------------------------------------------------------------------------------------------- No results for input(s): DDIMER in the last 72 hours. -------------------------------------------------------------------------------------------------------------------  Cardiac Enzymes No results for input(s): CKMB, TROPONINI, MYOGLOBIN in the last 168 hours.  Invalid input(s): CK ------------------------------------------------------------------------------------------------------------------ Invalid input(s): POCBNP  ---------------------------------------------------------------------------------------------------------------  Urinalysis    Component Value Date/Time   COLORURINE YELLOW (A) 06/14/2017 1355   APPEARANCEUR CLOUDY (A) 06/14/2017 1355   LABSPEC 1.018 06/14/2017 1355   PHURINE 5.0 06/14/2017 1355   GLUCOSEU NEGATIVE 06/14/2017 1355   HGBUR NEGATIVE 06/14/2017 1355   BILIRUBINUR NEGATIVE 06/14/2017 1355   KETONESUR NEGATIVE 06/14/2017 1355   PROTEINUR 100 (  A) 06/14/2017 1355   NITRITE NEGATIVE 06/14/2017 1355   LEUKOCYTESUR LARGE (A) 06/14/2017 1355     RADIOLOGY: [place] Intraoperative  Result Date: 06/11/2018 CLINICAL DATA:  Ultrasound was provided for use by the ordering physician, and a technical charge was applied by the performing facility.  No radiologist interpretation/professional services rendered.   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 Please perform percutaneous gastrostomy tube placement for enteric nutrition supplementation purposes. EXAM: PUSH GASTROSTOMY TUBE PLACEMENT COMPARISON:  Chest CT - 05/28/2018; CT of the abdomen and pelvis - 07/09/201 MEDICATIONS: Ancef 2 gm IV; Antibiotics were administered within 1 hour of the procedure. CONTRAST:  20 mL of Isovue 300 administered into the gastric lumen. ANESTHESIA/SEDATION: Moderate (conscious) sedation was employed during this procedure. A total of Versed 2 mg and Fentanyl 125 mcg was administered intravenously. Moderate Sedation Time: 45 minutes. The patient's level of consciousness and vital signs were monitored continuously by radiology nursing throughout the procedure under my direct supervision. FLUOROSCOPY TIME:  8 minutes (77 mGy) COMPLICATIONS: None immediate. PROCEDURE: Informed written consent was obtained from the patient and the patient's family following explanation of the procedure, risks, benefits and alternatives. A time out was performed prior to the initiation of the procedure. Ultrasound scanning was performed to demarcate the edge of the left lobe of the liver. Maximal barrier sterile technique utilized including caps, mask, sterile gowns, sterile gloves, large sterile drape, hand hygiene and Betadine prep. The left upper quadrant was sterilely prepped and draped. A oral gastric catheter was inserted into the stomach under fluoroscopy. Note was made of difficulty advancing the catheter beyond the gastroesophageal junction. Contrast injection demonstrated marking narrowing of the gastroesophageal junction however ultimately with the use of a Bentson wire, the Kumpe catheter was advanced through the GE junction to the level of the gastric fundus. Contrast injection confirmed appropriate positioning. The left costal margin and barium opacified transverse  colon were identified and avoided. Air was injected into the stomach for insufflation and visualization under fluoroscopy. Under sterile conditions and local anesthesia, 2 T tacks were utilized to pexy the anterior aspect of the stomach against the ventral abdominal wall. Contrast injection confirmed appropriate positioning of each of the T tacks. An incision was made between the T tacks and a 17 gauge trocar needle was utilized to access the stomach. Needle position was confirmed within the stomach with aspiration of air and injection of a small amount of contrast. A stiff Glidewire was advanced into the gastric lumen and under intermittent fluoroscopic guidance, the access needle was exchanged for a Kumpe catheter which was advanced to the level of the gastric antrum. Under intermittent fluoroscopic guidance, the Kumpe catheter was exchanged for a telescoping peel-away sheath, ultimately allowing placement of a 18-French balloon retention gastrostomy tube. The retention balloon was insufflated with a mixture of dilute saline and contrast and pulled taut against the anterior wall of the stomach. The external disc was cinched. Contrast injection confirms positioning within the stomach. Several spot radiographic images were obtained in various obliquities for documentation. The patient tolerated procedure well without immediate post procedural complication. FINDINGS: High-grade stricture involving the distal esophagus/gastroesophageal junction. After successful fluoroscopic guided placement, the gastrostomy tube is appropriately positioned with internal retention balloon against the ventral aspect of the gastric lumen. IMPRESSION: Successful fluoroscopic insertion of an 33 French balloon retention gastrostomy tube. The gastrostomy may be used immediately for medication administration and in 24  hrs for the initiation of feeds. Electronically Signed   By: Sandi Mariscal M.D.   On: 06/11/2018 14:55    EKG: Orders  placed or performed during the hospital encounter of 05/21/17  . ED EKG  . ED EKG  . EKG    IMPRESSION AND PLAN:  *Esophageal stricture and dysphagia Status post PEG tube placement. We will get dietitian consult to start the feeding and assess her nutritional status.  *Hypertension Patient is not able to swallow her pills orally and she has to crush. I will keep on IV hydralazine as needed basis until she has tube feeding started.  *Diabetes I will hold oral medication currently as she is not going to get feeding through the tube for 24 hours. Meanwhile I will keep on sliding scale coverage with insulin.   All the records are reviewed and case discussed with ED provider. Management plans discussed with the patient, family and they are in agreement.  CODE STATUS: Full code Code Status History    Date Active Date Inactive Code Status Order ID Comments User Context   03/18/2017 1033 03/18/2017 1544 Full Code [id number]  Algernon Huxley, MD Inpatient    Advance Directive Documentation     Most Recent Value  Type of Advance Directive  Healthcare Power of Attorney  Pre-existing out of facility DNR order (yellow form or pink MOST form)  -  "MOST" Form in Place?  -     Discussed with her 2 daughters were present in the room during my visit.  TOTAL TIME TAKING CARE OF THIS PATIENT: 45 minutes.    Vaughan Basta M.D on 06/11/2018   Between 7am to 6pm - Pager - [phone]  After 6pm go to [email or web address] - password [email or web address] [facility]  Office  [phone]  CC: Primary care physician; [name], MD   Note: This dictation was prepared with Dragon dictation along with smaller phrase technology. Any transcriptional errors that result from this process are unintentional.

## 2018-06-11 NOTE — Progress Notes (Signed)
Family Meeting Note  Advance Directive:yes  Today a meeting took place with the 2 daughters.  The following clinical team members were present during this meeting:MD  The following were discussed:Patient's diagnosis: Esophageal stricture, hypertension, diabetes, Patient's progosis: Unable to determine and Goals for treatment: Full Code  Additional follow-up to be provided: Dietician  Time spent during discussion:20 minutes  Vaughan Basta, MD

## 2018-06-11 NOTE — Progress Notes (Signed)
Per MD okay for RN to place order for iv zofran prn.

## 2018-06-11 NOTE — Procedures (Signed)
Pre procedure Dx: Esophageal Stricture Post Procedure Dx: Same  Successful fluoroscopic guided insertion of gastrostomy tube.   The gastrostomy tube may be used immediately for medications.   Tube feeds may be initiated in 24 hours as per the primary team.    EBL: Minimal  Complications: None immediate  Ronny Bacon, MD Pager #: [phone]

## 2018-06-11 NOTE — Plan of Care (Signed)
Sitting up in bed,given zofran IV for n/v ,will continue to monitor and follow planned regimen,[place] within reach,family at bedside.

## 2018-06-11 NOTE — OR Nursing (Signed)
Restless leg jerking improved, pain in stomach "easing"off

## 2018-06-11 NOTE — Consult Note (Addendum)
GI Inpatient Consult Note Lollie Sails MD  Reason for Consult: Dysphagia, arrangement of PEG tube placement and subsequent care   Attending Requesting Consult:  Outpatient Primary Physician: Dr. [name]  History of Present Illness: [name] is a 84 y.o. female who has been followed as an outpatient at [facility] clinic GI for several years.  [name] has a history of severe esophageal stenosis.  Initially [name] had recurrent ulcers at the bottom of the esophagus which did not respond to treatment  or dilatation.  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  [name] underwent repeat EGD on 04/15/2018 which demonstrated benign stricture biopsies negative for CMV dysplasia or malignancy.  It was not possible at that time even to place a neonatal endoscope through the pronounced narrowing.  [name] was referred to [facility] GI for other evaluation and possible serial dilation.  [name] was dilated twice there ever could not be dilated past 6 to 7 mm and scope could not be passed.  At that time recommendation for G-tube was made.  [name] was tolerating it very few foods, basically liquids, and many of her medications were not going through as well.  [name] does have a history of iron deficiency anemia most recently with stable labs.  Has been CT scanning of the chest evaluate for intrinsic esophageal compression or other lesions to be negative chest.  As such consultation was arranged with interventional radiology for placement of a tube under fluoroscopic guidance we have requested assistance from internal medicine/sound physicians with admission to the hospital for care in regards to nutritional consultation and management of other medical issues while inpatient.  I had seen the patient prior to her procedure this afternoon prior to her  procedure..  Past Medical History:  Past Medical History:  Diagnosis Date  . Anemia   . Aortic valve sclerosis   . Arrhythmia   . Arthritis    osteoarthritis  . Basal cell carcinoma of skin   . Brain tumor ([place])   . Brain tumor ([place])   . Cervical spine disease   . Diabetes mellitus without complication ([place])   . Dysrhythmia    sinus arrhythmia  . Esophageal ulcer without bleeding   . GERD (gastroesophageal reflux disease)   . History of kidney stones   . Hyperlipemia   . Hypertension   . Kidney stones   . Leaky heart valve   . Meningioma ([place])   . 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 Pulmonary hypertension ([place])     Problem List: Patient Active Problem List   Diagnosis Date Noted  . Esophageal stricture 06/11/2018  . Weight loss 05/29/2018  . Dysphagia 05/29/2018  . Esophageal stenosis 05/29/2018  . Hyperlipemia 04/04/2018  . Itching 01/08/2018  . Medication monitoring encounter 01/08/2018  . Paronychia and onychomycosis great toe, right  01/08/2018  . Esophagitis, CMV ([place]) 01/01/2018  . Hypertension 04/30/2017  . Mesenteric ischemia ([place]) 03/01/2017  . Type 2 diabetes mellitus with complication ([place]) 12/87/8676  . Iron deficiency anemia 02/28/2017  . PAD (peripheral artery disease) ([place]) 02/05/2017    Past Surgical History: Past Surgical History:  Procedure Laterality Date  . ABDOMINAL HYSTERECTOMY    . APPENDECTOMY    . ARTHROGRAM KNEE Left   . COLONOSCOPY WITH PROPOFOL N/A 06/20/2017   Procedure: COLONOSCOPY WITH PROPOFOL;  Surgeon: Lollie Sails, MD;  Location: [facility] ENDOSCOPY;  Service: Endoscopy;  Laterality: N/A;  . ESOPHAGOGASTRODUODENOSCOPY (EGD) WITH PROPOFOL N/A 03/14/2015   Procedure: ESOPHAGOGASTRODUODENOSCOPY (EGD) WITH PROPOFOL;  Surgeon: Josefine Class, MD;  Location: [facility] ENDOSCOPY;  Service: Endoscopy;  Laterality: N/A;  . ESOPHAGOGASTRODUODENOSCOPY (EGD) WITH PROPOFOL N/A 03/28/2017   Procedure: ESOPHAGOGASTRODUODENOSCOPY (EGD)  WITH PROPOFOL;  Surgeon: Lollie Sails, MD;  Location: [facility] ENDOSCOPY;  Service: Endoscopy;  Laterality: N/A;  . ESOPHAGOGASTRODUODENOSCOPY (EGD) WITH PROPOFOL N/A 06/20/2017   Procedure: ESOPHAGOGASTRODUODENOSCOPY (EGD) WITH PROPOFOL;  Surgeon: Lollie Sails, MD;  Location: [facility] ENDOSCOPY;  Service: Endoscopy;  Laterality: N/A;  . ESOPHAGOGASTRODUODENOSCOPY (EGD) WITH PROPOFOL N/A 10/21/2017   Procedure: ESOPHAGOGASTRODUODENOSCOPY (EGD) WITH PROPOFOL;  Surgeon: Lollie Sails, MD;  Location: [facility] ENDOSCOPY;  Service: Endoscopy;  Laterality: N/A;  . ESOPHAGOGASTRODUODENOSCOPY (EGD) WITH PROPOFOL N/A 04/15/2018   Procedure: ESOPHAGOGASTRODUODENOSCOPY (EGD) WITH PROPOFOL;  Surgeon: Lollie Sails, MD;  Location: [facility] ENDOSCOPY;  Service: Endoscopy;  Laterality: N/A;  . EYE SURGERY    . HYSTERECTOMY ABDOMINAL WITH SALPINGECTOMY    . IR GASTROSTOMY TUBE MOD SED  06/11/2018  . PERIPHERAL VASCULAR CATHETERIZATION N/A 01/23/2016   Procedure: Visceral Venography;  Surgeon: Algernon Huxley, MD;  Location: [place] CV LAB;  Service: Cardiovascular;  Laterality: N/A;  . PERIPHERAL VASCULAR CATHETERIZATION  01/23/2016   Procedure: Peripheral Vascular Intervention;  Surgeon: Algernon Huxley, MD;  Location: [place] CV LAB;  Service: Cardiovascular;;  . UPPER ESOPHAGEAL ENDOSCOPIC ULTRASOUND (EUS) N/A 12/05/2017   Procedure: UPPER ESOPHAGEAL ENDOSCOPIC ULTRASOUND (EUS);  Surgeon: Jola Schmidt, MD;  Location: [facility] ENDOSCOPY;  Service: Endoscopy;  Laterality: N/A;  . VISCERAL ANGIOGRAPHY N/A 03/18/2017   Procedure: Visceral Angiography;  Surgeon: Algernon Huxley, MD;  Location: [place] CV LAB;  Service: Cardiovascular;  Laterality: N/A;  . VISCERAL ARTERY INTERVENTION N/A 03/18/2017   Procedure: Visceral Artery Intervention;  Surgeon: Algernon Huxley, MD;  Location: [place] CV LAB;  Service: Cardiovascular;  Laterality: N/A;    Allergies: Allergies  Allergen Reactions  . Gabapentin Swelling   . Lipitor [Atorvastatin] Other (See Comments)    Muscle aches  . Mevacor [Lovastatin] Other (See Comments)    Muscle aches  . Milk-Related Compounds     Large quantities cause headaches   . Septra [Sulfamethoxazole-Trimethoprim]   . Statins Other (See Comments)    Muscle pain  . Sulfur Diarrhea and Nausea And Vomiting  . Zocor [Simvastatin] Other (See Comments)    Muscle aches    Home Medications: Medications Prior to Admission  Medication Sig Dispense Refill Last Dose  . acetaminophen (TYLENOL) 650 MG CR tablet Take 650 mg by mouth every 8 (eight) hours as needed for pain.   Past Month at Unknown time  . Hypromellose (GENTEAL MILD) 0.2 % SOLN Place 1 drop into both eyes 3 (three) times daily.   06/11/2018 at Unknown time  . allopurinol (ZYLOPRIM) 100 MG tablet Take 100 mg by mouth at bedtime.   0 06/08/2018  . Bioflavonoid Products (VITAMIN C) CHEW Chew 2 tablets by mouth daily.   06/08/2018  . clopidogrel (PLAVIX) 75 MG tablet take 1 tablet by mouth once daily 30 tablet 11 06/01/2018  . dexlansoprazole (DEXILANT) 60 MG capsule Take by mouth.   06/08/2018  . dicyclomine (BENTYL) 10 MG capsule Take 10 mg by mouth 3 (three) times daily.   Taking  . furosemide (LASIX) 40 MG tablet Take 40 mg by mouth 2 (two) times daily.  0 Taking  . glipiZIDE (GLUCOTROL) 5 MG tablet Take 5 mg by mouth daily before breakfast.   06/08/2018  . hydrALAZINE (APRESOLINE) 50 MG tablet Take 50 mg by mouth 2 (two) times daily.    06/08/2018  . hydrochlorothiazide (HYDRODIURIL) 25 MG tablet Take 25 mg by mouth daily.  0 06/08/2018  . lisinopril (PRINIVIL,ZESTRIL) 40 MG tablet Take 40 mg by mouth daily.   0 Taking  . metoprolol succinate (TOPROL-XL) 50 MG 24 hr tablet Take 75 mg by mouth 2 (two) times daily.   0 06/08/2018  . pantoprazole (PROTONIX) 40 MG tablet Take 40 mg by mouth 2 (two) times daily.   06/08/2018  . rOPINIRole (REQUIP) 2 MG tablet Take 2 mg by mouth 2 (two) times daily.   0 06/10/2018  . sertraline (ZOLOFT) 25  MG tablet Take 25 mg by mouth daily.    Taking  . sucralfate (CARAFATE) 1 g tablet Take 1 g by mouth 3 (three) times daily as needed (for difficulty swallowing).   0 Taking  . valGANciclovir HCl (VALCYTE PO) Take 9 mLs by mouth daily.   Taking  . vitamin B-12 (CYANOCOBALAMIN) 1000 MCG tablet Take 1,000 mcg by mouth daily.   06/08/2018   Home medication reconciliation was completed with the patient.   Scheduled Inpatient Medications:   . heparin  5,000 Units Subcutaneous Q8H  . Hypromellose  1 drop Both Eyes TID  . insulin aspart  0-9 Units Subcutaneous TID WC  . rOPINIRole  2 mg Oral BID    Continuous Inpatient Infusions:   . sodium chloride 10 mL/hr at 06/11/18 1221  . sodium chloride 75 mL/hr at 06/11/18 1605    PRN Inpatient Medications:  acetaminophen, docusate sodium, hydrALAZINE  Family History: family history includes Cancer in her father; Diabetes in her sister; Heart attack in her brother and sister; Hypertension in her mother; Stroke in her mother.   GI Family History:  Social History:   reports that [name] has never smoked. [name] has never used smokeless tobacco. [name] reports that [name] does not drink alcohol or use drugs. The patient denies ETOH, tobacco, or drug use.   ROS  Review of Systems:  Physical Examination: BP (!) 170/56   Pulse 72   Temp 98.7 F (37.1 C) (Oral)   Resp 18   Ht 5' 3.5" (1.613 m)   SpO2 100%   BMI 20.23 kg/m  Gen: 84 year old female distress HEENT: Normocephalic atraumatic eyes are anicteric Neck: Supple Chest: Clear to auscultation CV: Regular rate and rhythm Abd: Soft nontender nondistended bowel sounds positive normoactive Ext: No clubbing cyanosis or edema Skin: Cool and dry Other:  Data: Lab Results  Component Value Date   WBC 5.2 06/11/2018   HGB 11.5 (L) 06/11/2018   HCT 34.0 (L) 06/11/2018   MCV 88.7 06/11/2018   PLT 277 06/11/2018   Recent Labs  Lab 06/11/18 1114  HGB 11.5*   Lab Results  Component Value Date    NA 144 05/28/2018   K 4.1 05/28/2018   CL 105 05/28/2018   CO2 32 05/28/2018   BUN 23 05/28/2018   CREATININE 0.80 05/28/2018   Lab Results  Component Value Date   ALT 11 (L) 05/21/2017   AST 16 05/21/2017   ALKPHOS 61 05/21/2017   BILITOT 0.5 05/21/2017   Recent Labs  Lab 06/11/18 1114  APTT 31  INR 0.95   CBC Latest Ref Rng & Units 06/11/2018 05/28/2018 05/21/2018  WBC 3.6 - 11.0 K/uL 5.2 5.0  6.4  Hemoglobin 12.0 - 16.0 g/dL 11.5(L) 11.2(L) 11.0(L)  Hematocrit 35.0 - 47.0 % 34.0(L) 33.7(L) 32.6(L)  Platelets 150 - 440 K/uL 277 275 261    STUDIES: [place] Intraoperative  Result Date: 06/11/2018 CLINICAL DATA:  Ultrasound was provided for use by the ordering physician, and a technical charge was applied by the performing facility.  No radiologist interpretation/professional services rendered.   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 Please perform percutaneous gastrostomy tube placement for enteric nutrition supplementation purposes. EXAM: PUSH GASTROSTOMY TUBE PLACEMENT COMPARISON:  Chest CT - 05/28/2018; CT of the abdomen and pelvis - 07/09/201 MEDICATIONS: Ancef 2 gm IV; Antibiotics were administered within 1 hour of the procedure. CONTRAST:  20 mL of Isovue 300 administered into the gastric lumen. ANESTHESIA/SEDATION: Moderate (conscious) sedation was employed during this procedure. A total of Versed 2 mg and Fentanyl 125 mcg was administered intravenously. Moderate Sedation Time: 45 minutes. The patient's level of consciousness and vital signs were monitored continuously by radiology nursing throughout the procedure under my direct supervision. FLUOROSCOPY TIME:  8 minutes (77 mGy) COMPLICATIONS: None immediate. PROCEDURE: Informed written consent was obtained from the patient and the patient's family following explanation of the procedure, risks, benefits and alternatives. A time  out was performed prior to the initiation of the procedure. Ultrasound scanning was performed to demarcate the edge of the left lobe of the liver. Maximal barrier sterile technique utilized including caps, mask, sterile gowns, sterile gloves, large sterile drape, hand hygiene and Betadine prep. The left upper quadrant was sterilely prepped and draped. A oral gastric catheter was inserted into the stomach under fluoroscopy. Note was made of difficulty advancing the catheter beyond the gastroesophageal junction. Contrast injection demonstrated marking narrowing of the gastroesophageal junction however ultimately with the use of a Bentson wire, the Kumpe catheter was advanced through the GE junction to the level of the gastric fundus. Contrast injection confirmed appropriate positioning. The left costal margin and barium opacified transverse colon were identified and avoided. Air was injected into the stomach for insufflation and visualization under fluoroscopy. Under sterile conditions and local anesthesia, 2 T tacks were utilized to pexy the anterior aspect of the stomach against the ventral abdominal wall. Contrast injection confirmed appropriate positioning of each of the T tacks. An incision was made between the T tacks and a 17 gauge trocar needle was utilized to access the stomach. Needle position was confirmed within the stomach with aspiration of air and injection of a small amount of contrast. A stiff Glidewire was advanced into the gastric lumen and under intermittent fluoroscopic guidance, the access needle was exchanged for a Kumpe catheter which was advanced to the level of the gastric antrum. Under intermittent fluoroscopic guidance, the Kumpe catheter was exchanged for a telescoping peel-away sheath, ultimately allowing placement of a 18-French balloon retention gastrostomy tube. The retention balloon was insufflated with a mixture of dilute saline and contrast and pulled taut against the anterior wall  of the stomach. The external disc was cinched. Contrast injection confirms positioning within the stomach. Several spot radiographic images were obtained in various obliquities for documentation. The patient tolerated procedure well without immediate post procedural complication. FINDINGS: High-grade stricture involving the distal esophagus/gastroesophageal junction. After successful fluoroscopic guided placement, the gastrostomy tube is appropriately positioned with internal retention balloon against the ventral aspect of the gastric lumen. IMPRESSION: Successful fluoroscopic insertion of an 34 French balloon retention gastrostomy tube. The  gastrostomy may be used immediately for medication administration and in 24 hrs for the initiation of feeds. Electronically Signed   By: Sandi Mariscal M.D.   On: 06/11/2018 14:55   @IMAGES @  Assessment: 1.  History of severe distal esophagitis/ulceration secondary to CMV.  Now status post treatment of this.  Resultant highly fibrotic stenosis at the GE junction resistant to serial dilatation.  Patient with need for PEG tube placement for nutritional medication purposes.  Cannot be done by conventional endoscopic technique due to the high-grade stenosis, consultation was obtained  and arrangements with interventional radiology done as noted.  Recommendations: Case discussed with admitting physician Dr. Marthann Schiller.  Patient will need admission for at least observation.  Nutritional consult prior to beginning tube feeds hold tube feeds per recommendations of radiology following procedure.  [name] may also need to have a pharmacy consult in regards to medications that can be placed through the PEG medications that can be solubilized for that placement.  I would change her Dexilant to Prevacid LU tab milligrams which can be easily solved and placed through PEG tube when medications are restarted.  Thank you for assistance from interventional radiology and internal medicine for this  more difficult case of dysphagia. Please call with questions or concerns.  Lollie Sails, MD  06/11/2018 4:23 PM

## 2018-06-11 NOTE — Consult Note (Signed)
Chief Complaint: Esophageal stricture  Referring Physician(s): London,Christiane Hartman  Patient Status: [facility] - Out-pt  History of Present Illness: [name] is a 84 y.o. female with past medical history significant for aortic stenosis, arthritis, brain tumor (meningioma), basal cell carcinoma of the skin, diabetes, hypertension, hyperlipidemia, renal stones and pulmonary hypertension who has experienced significant weight loss and dysphasia secondary to CMV esophagitis and esophageal stenosis refractory to previous attempts at endoscopic esophageal dilatation.  As such, the patient presents with her daughters for percutaneous gastrostomy tube placement.  She serves as her own historian.  Patient continues to complain of significant dysphasia stating the food gets stuck in her mid esophagus oftentimes resulting in her vomiting.  She denies hematemesis.  Patient was able to drink her barium last evening.  She is otherwise without complaint.  Specifically, no fever or chills.  No chest pain or shortness of breath.  Past Medical History:  Diagnosis Date  . Anemia   . Aortic valve sclerosis   . Arrhythmia   . Arthritis    osteoarthritis  . Basal cell carcinoma of skin   . Brain tumor ([place])   . Brain tumor ([place])   . Cervical spine disease   . Diabetes mellitus without complication ([place])   . Dysrhythmia    sinus arrhythmia  . Esophageal ulcer without bleeding   . GERD (gastroesophageal reflux disease)   . History of kidney stones   . Hyperlipemia   . Hypertension   . Kidney stones   . Leaky heart valve   . Meningioma ([place])   . Occlusive mesenteric ischemia ([place])   . Pulmonary hypertension ([place])     Past Surgical History:  Procedure Laterality Date  . ABDOMINAL HYSTERECTOMY    . APPENDECTOMY    . ARTHROGRAM KNEE Left   . COLONOSCOPY WITH PROPOFOL N/A 06/20/2017   Procedure: COLONOSCOPY WITH PROPOFOL;  Surgeon: Lollie Sails, MD;  Location: [facility] ENDOSCOPY;   Service: Endoscopy;  Laterality: N/A;  . ESOPHAGOGASTRODUODENOSCOPY (EGD) WITH PROPOFOL N/A 03/14/2015   Procedure: ESOPHAGOGASTRODUODENOSCOPY (EGD) WITH PROPOFOL;  Surgeon: Josefine Class, MD;  Location: [facility] ENDOSCOPY;  Service: Endoscopy;  Laterality: N/A;  . ESOPHAGOGASTRODUODENOSCOPY (EGD) WITH PROPOFOL N/A 03/28/2017   Procedure: ESOPHAGOGASTRODUODENOSCOPY (EGD) WITH PROPOFOL;  Surgeon: Lollie Sails, MD;  Location: [facility] ENDOSCOPY;  Service: Endoscopy;  Laterality: N/A;  . ESOPHAGOGASTRODUODENOSCOPY (EGD) WITH PROPOFOL N/A 06/20/2017   Procedure: ESOPHAGOGASTRODUODENOSCOPY (EGD) WITH PROPOFOL;  Surgeon: Lollie Sails, MD;  Location: [facility] ENDOSCOPY;  Service: Endoscopy;  Laterality: N/A;  . ESOPHAGOGASTRODUODENOSCOPY (EGD) WITH PROPOFOL N/A 10/21/2017   Procedure: ESOPHAGOGASTRODUODENOSCOPY (EGD) WITH PROPOFOL;  Surgeon: Lollie Sails, MD;  Location: [facility] ENDOSCOPY;  Service: Endoscopy;  Laterality: N/A;  . ESOPHAGOGASTRODUODENOSCOPY (EGD) WITH PROPOFOL N/A 04/15/2018   Procedure: ESOPHAGOGASTRODUODENOSCOPY (EGD) WITH PROPOFOL;  Surgeon: Lollie Sails, MD;  Location: [facility] ENDOSCOPY;  Service: Endoscopy;  Laterality: N/A;  . EYE SURGERY    . HYSTERECTOMY ABDOMINAL WITH SALPINGECTOMY    . PERIPHERAL VASCULAR CATHETERIZATION N/A 01/23/2016   Procedure: Visceral Venography;  Surgeon: Algernon Huxley, MD;  Location: [place] CV LAB;  Service: Cardiovascular;  Laterality: N/A;  . PERIPHERAL VASCULAR CATHETERIZATION  01/23/2016   Procedure: Peripheral Vascular Intervention;  Surgeon: Algernon Huxley, MD;  Location: [place] CV LAB;  Service: Cardiovascular;;  . UPPER ESOPHAGEAL ENDOSCOPIC ULTRASOUND (EUS) N/A 12/05/2017   Procedure: UPPER ESOPHAGEAL ENDOSCOPIC ULTRASOUND (EUS);  Surgeon: Jola Schmidt, MD;  Location: [facility] ENDOSCOPY;  Service:  Endoscopy;  Laterality: N/A;  . VISCERAL ANGIOGRAPHY N/A 03/18/2017   Procedure: Visceral Angiography;  Surgeon: Algernon Huxley, MD;  Location:  [place] CV LAB;  Service: Cardiovascular;  Laterality: N/A;  . VISCERAL ARTERY INTERVENTION N/A 03/18/2017   Procedure: Visceral Artery Intervention;  Surgeon: Algernon Huxley, MD;  Location: [place] CV LAB;  Service: Cardiovascular;  Laterality: N/A;    Allergies: Gabapentin; Lipitor [atorvastatin]; Mevacor [lovastatin]; Milk-related compounds; Septra [sulfamethoxazole-trimethoprim]; Statins; Sulfur; and Zocor [simvastatin]  Medications: Prior to Admission medications   Medication Sig Start Date End Date Taking? Authorizing Provider  acetaminophen (TYLENOL) 650 MG CR tablet Take 650 mg by mouth every 8 (eight) hours as needed for pain.   Yes [provider]  Hypromellose (GENTEAL MILD) 0.2 % SOLN Place 1 drop into both eyes 3 (three) times daily.   Yes [provider]  allopurinol (ZYLOPRIM) 100 MG tablet Take 100 mg by mouth at bedtime.  02/15/17   [provider]  Bioflavonoid Products (VITAMIN C) CHEW Chew 2 tablets by mouth daily.    [provider]  clopidogrel (PLAVIX) 75 MG tablet take 1 tablet by mouth once daily 01/28/17   Algernon Huxley, MD  dexlansoprazole (DEXILANT) 60 MG capsule Take by mouth. 03/11/18   [provider]  dicyclomine (BENTYL) 10 MG capsule Take 10 mg by mouth 3 (three) times daily. 04/09/17 05/22/18  [provider]  furosemide (LASIX) 40 MG tablet Take 40 mg by mouth 2 (two) times daily.  01/03/15   [provider]  glipiZIDE (GLUCOTROL) 5 MG tablet Take 5 mg by mouth daily before breakfast.    [provider]  hydrALAZINE (APRESOLINE) 50 MG tablet Take 50 mg by mouth 2 (two) times daily.  10/08/16   [provider]  hydrochlorothiazide (HYDRODIURIL) 25 MG tablet Take 25 mg by mouth daily. 02/25/17   [provider]  lisinopril (PRINIVIL,ZESTRIL) 40 MG tablet Take 40 mg by mouth daily.  03/01/15   [provider]  metoprolol succinate (TOPROL-XL) 50 MG 24 hr tablet Take  75 mg by mouth 2 (two) times daily.  02/24/15   [provider]  pantoprazole (PROTONIX) 40 MG tablet Take 40 mg by mouth 2 (two) times daily. 02/19/17   [provider]  rOPINIRole (REQUIP) 2 MG tablet Take 2 mg by mouth 2 (two) times daily.  02/24/15   [provider]  sertraline (ZOLOFT) 25 MG tablet Take 25 mg by mouth daily.  06/06/17 06/06/18  [provider]  sucralfate (CARAFATE) 1 g tablet Take 1 g by mouth 3 (three) times daily as needed (for difficulty swallowing).  01/22/17   [provider]  valGANciclovir HCl (VALCYTE PO) Take 9 mLs by mouth daily.    [provider]  vitamin B-12 (CYANOCOBALAMIN) 1000 MCG tablet Take 1,000 mcg by mouth daily.    [provider]     Family History  Problem Relation Age of Onset  . Stroke Mother   . Hypertension Mother   . Cancer Father   . Heart attack Sister   . Diabetes Sister   . Heart attack Brother     Social History   Socioeconomic History  . Marital status: Widowed    Spouse name: Not on file  . Number of children: Not on file  . Years of education: Not on file  . Highest education level: Not on file  Occupational History  . Not on file  Social Needs  . Emergency planning/management officer  strain: Not on file  . Food insecurity:    Worry: Not on file    Inability: Not on file  . Transportation needs:    Medical: Not on file    Non-medical: Not on file  Tobacco Use  . Smoking status: Never Smoker  . Smokeless tobacco: Never Used  Substance and Sexual Activity  . Alcohol use: Never    Frequency: Never  . Drug use: Never  . Sexual activity: Not on file  Lifestyle  . Physical activity:    Days per week: Not on file    Minutes per session: Not on file  . Stress: Not on file  Relationships  . Social connections:    Talks on phone: Not on file    Gets together: Not on file    Attends religious service: Not on file    Active member of club or organization: Not on file    Attends  meetings of clubs or organizations: Not on file    Relationship status: Not on file  Other Topics Concern  . Not on file  Social History Narrative  . Not on file    ECOG Status: 1 - Symptomatic but completely ambulatory  Review of Systems: A 12 point ROS discussed and pertinent positives are indicated in the HPI above.  All other systems are negative.  Review of Systems  Constitutional: Positive for unexpected weight change. Negative for activity change, appetite change and fever.  HENT: Positive for trouble swallowing.   Respiratory: Negative.   Cardiovascular: Negative.   Gastrointestinal: Negative.     Vital Signs: BP (!) 157/59   Pulse 70   Temp 98.7 F (37.1 C) (Oral)   Resp 19   Ht 5' 3.5" (1.613 m)   SpO2 100%   BMI 20.23 kg/m   Physical Exam  Constitutional: She appears well-nourished.  HENT:  Head: Normocephalic and atraumatic.  Cardiovascular: Normal rate.  Pulmonary/Chest: Effort normal and breath sounds normal.  Psychiatric: She has a normal mood and affect. Her behavior is normal. Thought content normal.  Nursing note and vitals reviewed.   Imaging: Ct Chest W Contrast  Result Date: 05/28/2018 CLINICAL DATA:  Dysphagia, weight loss, vomiting. EXAM: CT CHEST WITH CONTRAST TECHNIQUE: Multidetector CT imaging of the chest was performed during intravenous contrast administration. CONTRAST:  33mL ISOVUE-300 IOPAMIDOL (ISOVUE-300) INJECTION 61% COMPARISON:  04/05/2017 FINDINGS: Cardiovascular: The heart is top-normal in size. No pericardial effusion. No evidence of thoracic aortic aneurysm. Atherosclerotic calcifications of the aortic arch/root. Three vessel coronary atherosclerosis. Mediastinum/Nodes: No suspicious mediastinal lymphadenopathy. Visualized thyroid is mildly heterogeneous/nodular. Mild wall thickening involving the mid/distal esophagus (for example, series 3/image 98), possibly reflecting esophagitis. Suspected stricture of the distal esophagus at  the GE junction, with mild submucosal edema (series 3/image 115). This may reflect a post infectious/inflammatory stricture, although underlying neoplasm is not entirely excluded. Lungs/Pleura: Biapical pleural-parenchymal scarring, right greater than left. Evaluation of the lung parenchyma is constrained by respiratory motion. Within that constraint, there are no suspicious pulmonary nodules. Mild subpleural scarring in the right lower lobe. No focal consolidation. No pleural effusion or pneumothorax. Upper Abdomen: Visualized upper abdomen is grossly unremarkable. Musculoskeletal: Degenerative changes of the visualized thoracolumbar spine. Exaggerated upper thoracic kyphosis. Cervical spine fixation hardware, incompletely visualized. IMPRESSION: Distal esophageal stricture at the GE junction with associated mild submucosal edema, possibly reflecting a post infectious/inflammatory stricture, underlying neoplasm not entirely excluded. Consider endoscopy for further evaluation as clinically warranted. Associated mild wall thickening involving the mid/distal esophagus, possibly reflecting  esophagitis. Otherwise negative CT chest. Aortic Atherosclerosis (ICD10-I70.0). Electronically Signed   By: Julian Hy M.D.   On: 05/28/2018 14:10    Labs:  CBC: Recent Labs    04/04/18 1039 05/21/18 1103 05/28/18 0846 06/11/18 1114  WBC 5.2 6.4 5.0 5.2  HGB 11.3* 11.0* 11.2* 11.5*  HCT 33.6* 32.6* 33.7* 34.0*  PLT 263 261 275 277    COAGS: Recent Labs    06/11/18 1114  INR 0.95  APTT 31    BMP: Recent Labs    01/30/18 1633 05/28/18 0846 05/28/18 0933  NA 143 144  --   K 4.1 4.1  --   CL 107 105  --   CO2 31 32  --   GLUCOSE 129* 141*  --   BUN 20 23  --   CALCIUM 8.7 9.1  --   CREATININE 0.71 0.76 0.80  GFRNONAA  --  >60  --   GFRAA  --  >60  --     LIVER FUNCTION TESTS: No results for input(s): BILITOT, AST, ALT, ALKPHOS, PROT, ALBUMIN in the last 8760 hours.  TUMOR MARKERS: No  results for input(s): AFPTM, CEA, CA199, CHROMGRNA in the last 8760 hours.  Assessment and Plan:  [name] is a 84 y.o. female with past medical history significant for aortic stenosis, arthritis, brain tumor (meningioma), basal cell carcinoma of the skin, diabetes, hypertension, hyperlipidemia, renal stones and pulmonary hypertension who has experienced significant weight loss and dysphasia secondary to CMV esophagitis and esophageal stenosis refractory to previous attempts at endoscopic esophageal dilatation.  As such, the patient presents with her daughters for percutaneous gastrostomy tube placement.    Risks and benefits of image guided gastrostomy tube placement was discussed with the patient including, but not limited to the need for a barium enema during the procedure, bleeding, infection, peritonitis, or damage to adjacent structures.  All of the patient's questions were answered, patient is agreeable to proceed.  Consent signed and in chart.  Thank you for this interesting consult.  I greatly enjoyed meeting [name] and look forward to participating in their care.  A copy of this report was sent to the requesting provider on this date.  Electronically Signed: Sandi Mariscal, MD 06/11/2018, 12:39 PM   I spent a total of 30 Minutes in face to face in clinical consultation, greater than 50% of which was counseling/coordinating care for percutaneous gastrostomy tube placement.

## 2018-06-12 DIAGNOSIS — E43 Unspecified severe protein-calorie malnutrition: Secondary | ICD-10-CM

## 2018-06-12 LAB — BASIC METABOLIC PANEL
Anion gap: 7 (ref 5–15)
BUN: 18 mg/dL (ref 8–23)
CO2: 26 mmol/L (ref 22–32)
Calcium: 8.4 mg/dL — ABNORMAL LOW (ref 8.9–10.3)
Chloride: 109 mmol/L (ref 98–111)
Creatinine, Ser: 0.68 mg/dL (ref 0.44–1.00)
GFR calc Af Amer: >60 mL/min (ref >60)
GFR calc non Af Amer: >60 mL/min (ref >60)
Glucose, Bld: 73 mg/dL (ref 70–99)
Potassium: 3.8 mmol/L (ref 3.5–5.1)
Sodium: 142 mmol/L (ref 135–145)

## 2018-06-12 LAB — CBC
HCT: 30.9 % — ABNORMAL LOW (ref 35.0–47.0)
Hemoglobin: 10.4 g/dL — ABNORMAL LOW (ref 12.0–16.0)
MCH: 30.3 pg (ref 26.0–34.0)
MCHC: 33.7 g/dL (ref 32.0–36.0)
MCV: 89.7 fL (ref 80.0–100.0)
Platelets: 248 10*3/uL (ref 150–440)
RBC: 3.44 MIL/uL — ABNORMAL LOW (ref 3.80–5.20)
RDW: 14.7 % — ABNORMAL HIGH (ref 11.5–14.5)
WBC: 7.7 10*3/uL (ref 3.6–11.0)

## 2018-06-12 LAB — GLUCOSE, CAPILLARY
Glucose-Capillary: 110 mg/dL — ABNORMAL HIGH (ref 70–99)
Glucose-Capillary: 149 mg/dL — ABNORMAL HIGH (ref 70–99)
Glucose-Capillary: 154 mg/dL — ABNORMAL HIGH (ref 70–99)
Glucose-Capillary: 158 mg/dL — ABNORMAL HIGH (ref 70–99)
Glucose-Capillary: 84 mg/dL (ref 70–99)
Glucose-Capillary: 92 mg/dL (ref 70–99)

## 2018-06-12 MED ORDER — FREE WATER
30.0000 mL | Status: DC
  Administered 2018-06-12 – 2018-06-13 (×6): 30 mL

## 2018-06-12 MED ORDER — ALLOPURINOL 20 MG/ML ORAL SUSPENSION
100.0000 mg | Freq: Every day | ORAL | Status: DC
  Administered 2018-06-12: 100 mg
  Filled 2018-06-12 (×2): qty 5

## 2018-06-12 MED ORDER — SERTRALINE HCL 20 MG/ML PO CONC
25.0000 mg | Freq: Every day | ORAL | Status: DC
  Administered 2018-06-12: 25 mg
  Filled 2018-06-12 (×2): qty 1.25

## 2018-06-12 MED ORDER — ACETAMINOPHEN 160 MG/5ML PO SOLN
650.0000 mg | Freq: Four times a day (QID) | ORAL | Status: DC | PRN
  Filled 2018-06-12: qty 20.3

## 2018-06-12 MED ORDER — VITAMIN C 500 MG PO TABS
250.0000 mg | ORAL_TABLET | Freq: Two times a day (BID) | ORAL | Status: DC
  Administered 2018-06-12 – 2018-06-13 (×3): 250 mg
  Filled 2018-06-12 (×4): qty 0.5

## 2018-06-12 MED ORDER — LANSOPRAZOLE 15 MG PO TBDP: 30.0000 mg | ORAL_TABLET | Freq: Every day | ORAL | Status: DC

## 2018-06-12 MED ORDER — ADULT MULTIVITAMIN LIQUID CH
15.0000 mL | Freq: Every day | ORAL | Status: DC
  Administered 2018-06-13: 15 mL
  Filled 2018-06-12: qty 15

## 2018-06-12 MED ORDER — SUCRALFATE 1 GM/10ML PO SUSP: 1.0000 g | Freq: Three times a day (TID) | ORAL | Status: DC | PRN

## 2018-06-12 MED ORDER — HYDROCHLOROTHIAZIDE 10 MG/ML ORAL SUSPENSION: 25.0000 mg | Freq: Every day | ORAL | Status: DC

## 2018-06-12 MED ORDER — DOCUSATE SODIUM 50 MG/5ML PO LIQD
100.0000 mg | Freq: Two times a day (BID) | ORAL | Status: DC | PRN
  Filled 2018-06-12: qty 10

## 2018-06-12 MED ORDER — OSMOLITE 1.5 CAL PO LIQD
120.0000 mL | Freq: Four times a day (QID) | ORAL | Status: DC
  Administered 2018-06-12 – 2018-06-13 (×4): 120 mL

## 2018-06-12 MED ORDER — LISINOPRIL 20 MG PO TABS: 40.0000 mg | ORAL_TABLET | Freq: Every day | ORAL | Status: DC

## 2018-06-12 MED ORDER — FUROSEMIDE 10 MG/ML PO SOLN
40.0000 mg | Freq: Two times a day (BID) | ORAL | Status: DC
  Administered 2018-06-12 – 2018-06-13 (×2): 40 mg
  Filled 2018-06-12 (×3): qty 4

## 2018-06-12 MED ORDER — PANTOPRAZOLE SODIUM 40 MG PO PACK
40.0000 mg | PACK | Freq: Two times a day (BID) | ORAL | Status: DC
  Administered 2018-06-12 – 2018-06-13 (×2): 40 mg
  Filled 2018-06-12 (×4): qty 20

## 2018-06-12 MED ORDER — INSULIN ASPART 100 UNIT/ML ~~LOC~~ SOLN
0.0000 [IU] | Freq: Four times a day (QID) | SUBCUTANEOUS | Status: DC
  Administered 2018-06-12: 1 [IU] via SUBCUTANEOUS
  Administered 2018-06-13: 5 [IU] via SUBCUTANEOUS
  Administered 2018-06-13: 2 [IU] via SUBCUTANEOUS
  Filled 2018-06-12 (×3): qty 1

## 2018-06-12 MED ORDER — ROPINIROLE HCL 1 MG PO TABS
2.0000 mg | ORAL_TABLET | Freq: Two times a day (BID) | ORAL | Status: DC
  Administered 2018-06-12 – 2018-06-13 (×2): 2 mg
  Filled 2018-06-12 (×2): qty 2

## 2018-06-12 MED ORDER — PRO-STAT SUGAR FREE PO LIQD
30.0000 mL | Freq: Every day | ORAL | Status: DC
  Administered 2018-06-13: 30 mL

## 2018-06-12 MED ORDER — HYDROCHLOROTHIAZIDE 25 MG PO TABS
25.0000 mg | ORAL_TABLET | Freq: Every day | ORAL | Status: DC
  Administered 2018-06-12: 25 mg
  Filled 2018-06-12: qty 1

## 2018-06-12 MED ORDER — SERTRALINE HCL 20 MG/ML PO CONC: 25.0000 mg | Freq: Every day | ORAL | Status: DC

## 2018-06-12 MED ORDER — LISINOPRIL 20 MG PO TABS
40.0000 mg | ORAL_TABLET | Freq: Every day | ORAL | Status: DC
  Administered 2018-06-12: 40 mg
  Filled 2018-06-12: qty 2

## 2018-06-12 MED ORDER — METOPROLOL TARTRATE 25 MG PO TABS
37.5000 mg | ORAL_TABLET | Freq: Two times a day (BID) | ORAL | Status: DC
  Administered 2018-06-12: 37.5 mg
  Filled 2018-06-12 (×2): qty 2

## 2018-06-12 MED ORDER — CLOPIDOGREL BISULFATE 75 MG PO TABS
75.0000 mg | ORAL_TABLET | Freq: Every day | ORAL | Status: DC
  Administered 2018-06-12 – 2018-06-13 (×2): 75 mg
  Filled 2018-06-12 (×2): qty 1

## 2018-06-12 NOTE — Consult Note (Addendum)
Subjective: Patient seen for history of dysphagia, status post PEG.  She is doing well.  She did have some nausea and some peri-PEG site discomfort last night.  Both of these are improving.  She states that currently is her pain is more like an ache in more so occurs when she tries to move some.  Nausea is well controlled medication.  Objective: Vital signs in last 24 hours: Temp:  [97.9 F (36.6 C)-98.7 F (37.1 C)] 98.4 F (36.9 C) (09/12 0634) Pulse Rate:  [55-132] 81 (09/12 0634) Resp:  [11-22] 20 (09/12 0634) BP: (130-219)/(48-107) 150/48 (09/12 0634) SpO2:  [94 %-100 %] 98 % (09/12 0634) Weight:  [53.5 kg] 53.5 kg (09/11 1700) Blood pressure (!) 150/48, pulse 81, temperature 98.4 F (36.9 C), temperature source Oral, resp. rate 20, height 5\' 3"  (1.6 m), weight 53.5 kg, SpO2 98 %.   Intake/Output from previous day: 09/11 0701 - 09/12 0700 In: 968.4 [I.V.:968.4] Out: 100 [Urine:100]  Intake/Output this shift: No intake/output data recorded.   General appearance: 84 year old female no distress Resp: Bilaterally clear Cardio: Regular rate and rhythm GI: Soft nondistended bowel sounds are positive normoactive, appropriately tender Extremities: No clubbing cyanosis or edema   Lab Results: Results for orders placed or performed during the hospital encounter of 06/11/18 (from the past 24 hour(s))  APTT upon arrival     Status: None   Collection Time: 06/11/18 11:14 AM  Result Value Ref Range   aPTT 31 24 - 36 seconds  CBC upon arrival     Status: Abnormal   Collection Time: 06/11/18 11:14 AM  Result Value Ref Range   WBC 5.2 3.6 - 11.0 K/uL   RBC 3.84 3.80 - 5.20 MIL/uL   Hemoglobin 11.5 (L) 12.0 - 16.0 g/dL   HCT 34.0 (L) 35.0 - 47.0 %   MCV 88.7 80.0 - 100.0 fL   MCH 30.0 26.0 - 34.0 pg   MCHC 33.8 32.0 - 36.0 g/dL   RDW 14.8 (H) 11.5 - 14.5 %   Platelets 277 150 - 440 K/uL  Protime-INR upon arrival     Status: None   Collection Time: 06/11/18 11:14 AM  Result  Value Ref Range   Prothrombin Time 12.6 11.4 - 15.2 seconds   INR 0.95   Glucose, capillary     Status: None   Collection Time: 06/11/18 11:14 AM  Result Value Ref Range   Glucose-Capillary 98 70 - 99 mg/dL  CBC     Status: Abnormal   Collection Time: 06/11/18  5:11 PM  Result Value Ref Range   WBC 6.8 3.6 - 11.0 K/uL   RBC 3.96 3.80 - 5.20 MIL/uL   Hemoglobin 11.6 (L) 12.0 - 16.0 g/dL   HCT 35.1 35.0 - 47.0 %   MCV 88.5 80.0 - 100.0 fL   MCH 29.2 26.0 - 34.0 pg   MCHC 33.0 32.0 - 36.0 g/dL   RDW 14.2 11.5 - 14.5 %   Platelets 269 150 - 440 K/uL  Creatinine, serum     Status: None   Collection Time: 06/11/18  5:11 PM  Result Value Ref Range   Creatinine, Ser 0.61 0.44 - 1.00 mg/dL   GFR calc non Af Amer >60 >60 mL/min   GFR calc Af Amer >60 >60 mL/min  Glucose, capillary     Status: Abnormal   Collection Time: 06/11/18  5:36 PM  Result Value Ref Range   Glucose-Capillary 102 (H) 70 - 99 mg/dL  Glucose, capillary  Status: None   Collection Time: 06/11/18  9:26 PM  Result Value Ref Range   Glucose-Capillary 88 70 - 99 mg/dL  Basic metabolic panel     Status: Abnormal   Collection Time: 06/12/18  3:32 AM  Result Value Ref Range   Sodium 142 135 - 145 mmol/L   Potassium 3.8 3.5 - 5.1 mmol/L   Chloride 109 98 - 111 mmol/L   CO2 26 22 - 32 mmol/L   Glucose, Bld 73 70 - 99 mg/dL   BUN 18 8 - 23 mg/dL   Creatinine, Ser 0.68 0.44 - 1.00 mg/dL   Calcium 8.4 (L) 8.9 - 10.3 mg/dL   GFR calc non Af Amer >60 >60 mL/min   GFR calc Af Amer >60 >60 mL/min   Anion gap 7 5 - 15  CBC     Status: Abnormal   Collection Time: 06/12/18  3:32 AM  Result Value Ref Range   WBC 7.7 3.6 - 11.0 K/uL   RBC 3.44 (L) 3.80 - 5.20 MIL/uL   Hemoglobin 10.4 (L) 12.0 - 16.0 g/dL   HCT 30.9 (L) 35.0 - 47.0 %   MCV 89.7 80.0 - 100.0 fL   MCH 30.3 26.0 - 34.0 pg   MCHC 33.7 32.0 - 36.0 g/dL   RDW 14.7 (H) 11.5 - 14.5 %   Platelets 248 150 - 440 K/uL     Recent Labs    06/11/18 1114  06/11/18 1711 06/12/18 0332  WBC 5.2 6.8 7.7  HGB 11.5* 11.6* 10.4*  HCT 34.0* 35.1 30.9*  PLT 277 269 248   BMET Recent Labs    06/11/18 1711 06/12/18 0332  NA  --  142  K  --  3.8  CL  --  109  CO2  --  26  GLUCOSE  --  73  BUN  --  18  CREATININE 0.61 0.68  CALCIUM  --  8.4*   LFT No results for input(s): PROT, ALBUMIN, AST, ALT, ALKPHOS, BILITOT, BILIDIR, IBILI in the last 72 hours. PT/INR Recent Labs    06/11/18 1114  LABPROT 12.6  INR 0.95   Hepatitis Panel No results for input(s): HEPBSAG, HCVAB, HEPAIGM, HEPBIGM in the last 72 hours. C-Diff No results for input(s): CDIFFTOX in the last 72 hours. No results for input(s): CDIFFPCR in the last 72 hours.   Studies/Results: [place] Intraoperative  Result Date: 06/11/2018 CLINICAL DATA:  Ultrasound was provided for use by the ordering physician, and a technical charge was applied by the performing facility.  No radiologist interpretation/professional services rendered.   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 Please perform percutaneous gastrostomy tube placement for enteric nutrition supplementation purposes. EXAM: PUSH GASTROSTOMY TUBE PLACEMENT COMPARISON:  Chest CT - 05/28/2018; CT of the abdomen and pelvis - 07/09/201 MEDICATIONS: Ancef 2 gm IV; Antibiotics were administered within 1 hour of the procedure. CONTRAST:  20 mL of Isovue 300 administered into the gastric lumen. ANESTHESIA/SEDATION: Moderate (conscious) sedation was employed during this procedure. A total of Versed 2 mg and Fentanyl 125 mcg was administered intravenously. Moderate Sedation Time: 45 minutes. The patient's level of consciousness and vital signs were monitored continuously by radiology nursing throughout the procedure under my direct supervision. FLUOROSCOPY TIME:  8 minutes (77 mGy) COMPLICATIONS: None immediate. PROCEDURE: Informed written  consent was obtained from the patient and the patient's family following explanation of the procedure, risks, benefits and alternatives. A  time out was performed prior to the initiation of the procedure. Ultrasound scanning was performed to demarcate the edge of the left lobe of the liver. Maximal barrier sterile technique utilized including caps, mask, sterile gowns, sterile gloves, large sterile drape, hand hygiene and Betadine prep. The left upper quadrant was sterilely prepped and draped. A oral gastric catheter was inserted into the stomach under fluoroscopy. Note was made of difficulty advancing the catheter beyond the gastroesophageal junction. Contrast injection demonstrated marking narrowing of the gastroesophageal junction however ultimately with the use of a Bentson wire, the Kumpe catheter was advanced through the GE junction to the level of the gastric fundus. Contrast injection confirmed appropriate positioning. The left costal margin and barium opacified transverse colon were identified and avoided. Air was injected into the stomach for insufflation and visualization under fluoroscopy. Under sterile conditions and local anesthesia, 2 T tacks were utilized to pexy the anterior aspect of the stomach against the ventral abdominal wall. Contrast injection confirmed appropriate positioning of each of the T tacks. An incision was made between the T tacks and a 17 gauge trocar needle was utilized to access the stomach. Needle position was confirmed within the stomach with aspiration of air and injection of a small amount of contrast. A stiff Glidewire was advanced into the gastric lumen and under intermittent fluoroscopic guidance, the access needle was exchanged for a Kumpe catheter which was advanced to the level of the gastric antrum. Under intermittent fluoroscopic guidance, the Kumpe catheter was exchanged for a telescoping peel-away sheath, ultimately allowing placement of a 18-French balloon retention  gastrostomy tube. The retention balloon was insufflated with a mixture of dilute saline and contrast and pulled taut against the anterior wall of the stomach. The external disc was cinched. Contrast injection confirms positioning within the stomach. Several spot radiographic images were obtained in various obliquities for documentation. The patient tolerated procedure well without immediate post procedural complication. FINDINGS: High-grade stricture involving the distal esophagus/gastroesophageal junction. After successful fluoroscopic guided placement, the gastrostomy tube is appropriately positioned with internal retention balloon against the ventral aspect of the gastric lumen. IMPRESSION: Successful fluoroscopic insertion of an 92 French balloon retention gastrostomy tube. The gastrostomy may be used immediately for medication administration and in 24 hrs for the initiation of feeds. Electronically Signed   By: Sandi Mariscal M.D.   On: 06/11/2018 14:55    Scheduled Inpatient Medications:   . heparin  5,000 Units Subcutaneous Q8H  . Influenza vac split quadrivalent PF  0.5 mL Intramuscular Tomorrow-1000  . insulin aspart  0-9 Units Subcutaneous TID WC  . polyvinyl alcohol  1 drop Both Eyes TID  . rOPINIRole  2 mg Oral BID    Continuous Inpatient Infusions:   . sodium chloride 75 mL/hr at 06/11/18 1813    PRN Inpatient Medications:  acetaminophen, docusate sodium, hydrALAZINE, ondansetron (ZOFRAN) IV  Miscellaneous:  Assessment:  1.  Status post PEG, in setting of history of severe esophageal stenosis/history of CMV esophagitis-fibrosis.  Patient doing well 2.  Nutritional deficit.  Patient to have a nutritional consult today to help her with feedings and PEG tube care.  Plan:  As noted above.  Likely discharge after personal consult and release for PEG tube with feedings trial use by radiology.  Lollie Sails MD 06/12/2018, 7:55 AM

## 2018-06-12 NOTE — Progress Notes (Signed)
Initial Nutrition Assessment  DOCUMENTATION CODES:   Severe malnutrition in context of chronic illness  INTERVENTION:   Osmolite 1.5- Give 4 cans daily- Initiate at 1/2 can every 6 hours and increase to 1 can every 6 hours on 9/13- Please flush with 64m of water before and after each tube feeding.   Prostat liquid protein 30 ml daily via tube- provides 100 kcal, 15 grams protein.  Liquid MVI daily via tube   Vitamin C 2550mBID via tube   Free water flushes 3035m 4 hrs  Regimen provides 1520kcal/day, 75g/day protein, 1384m35my   Recommend discontinue IVF  NUTRITION DIAGNOSIS:   Severe Malnutrition related to dysphagia as evidenced by per patient/family report.  GOAL:   Patient will meet greater than or equal to 90% of their needs  MONITOR:   Labs, Weight trends, TF tolerance, Skin, I & O's  REASON FOR ASSESSMENT:   Consult Enteral/tube feeding initiation and management  ASSESSMENT:   88 y41. female with a known history of anemia, aortic valve sclerosis, arrhythmia, basal cell cancer of skin, diabetes, esophageal ulcer without bleeding, hyperlipidemia, hypertension, pulmonary hypertension, meningioma-has difficulty swallowing food ongoing for past 1-2 years.  She had esophageal stretching tried twice at [facility] it reoccurs in few months with worsening swallowing difficulty recently. Pt now s/p IR G-tube placement 9/11   Met with pt in room today. Pt reports difficulty swallowing and regurgitating after eating or drinking for the past 1-2 years. Pt reports this has progressively gotten worse and she has now gotten to where she can't keep down much of anything except for Glucerna. Pt has also been unable to keep her medications down. Pt reports drinking some coffee this morning and immediately regurgitating while RD was in room. Pt reports weighing 150-160lbs prior to the dysphagia starting but per chart, pt has been weight stable for over a year.  Pt noted to  have benign esophageal stricture that is not improved after dilation. Pt s/p IR placement of an 18-French balloon retention gastrostomy tube yesterday. Plan to initiate tube feeds today at 1/2 goal rate. Will check refeeding labs in the morning and advance to goal rate tomorrow if labs look ok. RN contacted to provided tube care and administration instructions to pt and family. Case manager contacted to set up supplies needed through advanced home care. Pharmacy contacted to change home medications to where all of her meds can be given via the G-tube.   Medications reviewed and include: heparin, insulin, NaCl '@75ml' /hr, zofran  Labs reviewed: Hgb 10.4(L), Hct 30.9(L)  NUTRITION - FOCUSED PHYSICAL EXAM:    Most Recent Value  Orbital Region  Moderate depletion  Upper Arm Region  Mild depletion  Thoracic and Lumbar Region  Moderate depletion  Buccal Region  Moderate depletion  Temple Region  Moderate depletion  Clavicle Bone Region  Severe depletion  Clavicle and Acromion Bone Region  Severe depletion  Scapular Bone Region  Severe depletion  Dorsal Hand  Severe depletion  Patellar Region  Severe depletion  Anterior Thigh Region  Severe depletion  Posterior Calf Region  Severe depletion  Edema (RD Assessment)  None  Hair  Reviewed  Eyes  Reviewed  Mouth  Reviewed  Skin  Reviewed [[email or web address]  Reviewed     Diet Order:   Diet Order            Diet clear liquid Room service appropriate? Yes; Fluid consistency: Thin  Diet effective now  EDUCATION NEEDS:   Education needs have been addressed  Skin:  Skin Assessment: Reviewed RN Assessment(ecchymosis )  Last BM:  9/11  Height:   Ht Readings from Last 1 Encounters:  06/11/18 '5\' 3"'  (1.6 m)    Weight:   Wt Readings from Last 1 Encounters:  06/11/18 53.5 kg    Ideal Body Weight:  52.3 kg  BMI:  Body mass index is 20.89 kg/m.  Estimated Nutritional Needs:   Kcal:  1300-1500kcal/day   Protein:   79-80g/day   Fluid:  1.3-1.6L/day   Koleen Distance MS, RD, LDN Pager #- [phone] Office#- [phone] After Hours Pager: [phone]

## 2018-06-12 NOTE — Progress Notes (Signed)
Assumed care of pt. Agree with previous assessment. Focused assessment done. Refer to charting.

## 2018-06-12 NOTE — Progress Notes (Signed)
[place] at [place] NAME: [name]    MR#:  [id number]  DATE OF BIRTH:  1928/12/25  SUBJECTIVE:  CHIEF COMPLAINT:  No chief complaint on file.  Generalized weakness. REVIEW OF SYSTEMS:  Review of Systems  Constitutional: Positive for malaise/fatigue. Negative for chills and fever.  HENT: Negative for sore throat.   Eyes: Negative for blurred vision and double vision.  Respiratory: Negative for cough, hemoptysis, shortness of breath, wheezing and stridor.   Cardiovascular: Negative for chest pain, palpitations, orthopnea and leg swelling.  Gastrointestinal: Negative for abdominal pain, blood in stool, diarrhea, melena, nausea and vomiting.  Genitourinary: Negative for dysuria, flank pain and hematuria.  Musculoskeletal: Negative for back pain and joint pain.  Skin: Negative for rash.  Neurological: Negative for dizziness, sensory change, focal weakness, seizures, loss of consciousness, weakness and headaches.  Endo/Heme/Allergies: Negative for polydipsia.  Psychiatric/Behavioral: Negative for depression. The patient is not nervous/anxious.     DRUG ALLERGIES:   Allergies  Allergen Reactions  . Gabapentin Swelling  . Lipitor [Atorvastatin] Other (See Comments)    Muscle aches  . Mevacor [Lovastatin] Other (See Comments)    Muscle aches  . Milk-Related Compounds     Large quantities cause headaches   . Septra [Sulfamethoxazole-Trimethoprim]   . Statins Other (See Comments)    Muscle pain  . Sulfur Diarrhea and Nausea And Vomiting  . Zocor [Simvastatin] Other (See Comments)    Muscle aches   VITALS:  Blood pressure (!) 156/62, pulse 81, temperature 98.4 F (36.9 C), temperature source Oral, resp. rate 20, height 5\' 3"  (1.6 m), weight 53.5 kg, SpO2 98 %. PHYSICAL EXAMINATION:  Physical Exam  Constitutional: She is oriented to person, place, and time. No distress.  Severe malnutrition.  HENT:  Head: Normocephalic.    Mouth/Throat: Oropharynx is clear and moist.  Eyes: Pupils are equal, round, and reactive to light. Conjunctivae and EOM are normal. No scleral icterus.  Neck: Normal range of motion. Neck supple. No JVD present. No tracheal deviation present.  Cardiovascular: Normal rate, regular rhythm and normal heart sounds. Exam reveals no gallop.  No murmur heard. Pulmonary/Chest: Effort normal and breath sounds normal. No respiratory distress. She has no wheezes. She has no rales.  Abdominal: Soft. Bowel sounds are normal. She exhibits no distension. There is no tenderness. There is no rebound.  Musculoskeletal: Normal range of motion. She exhibits no edema or tenderness.  Neurological: She is alert and oriented to person, place, and time. No cranial nerve deficit.  Skin: No rash noted. No erythema.  Psychiatric: She has a normal mood and affect.   LABORATORY PANEL:  Female CBC Recent Labs  Lab 06/12/18 0332  WBC 7.7  HGB 10.4*  HCT 30.9*  PLT 248   ------------------------------------------------------------------------------------------------------------------ Chemistries  Recent Labs  Lab 06/12/18 0332  NA 142  K 3.8  CL 109  CO2 26  GLUCOSE 73  BUN 18  CREATININE 0.68  CALCIUM 8.4*   RADIOLOGY:  No results found. ASSESSMENT AND PLAN:   *Esophageal stricture and dysphagia Status post PEG tube placement. Start tube feeding per dietitian's recommendation. Follow-up labs in a.m.  Monitor refeeding syndrome.  *Hypertension Resume home hypertension medication per tube, IV hydralazine as needed.  *Diabetes on sliding scale coverage with insulin.  Severe malnutrition.  Follow dietitian's recommendation and start tube feeding.  Generalized weakness.  PT evaluation.  All the records are reviewed and case discussed with Care Management/Social Worker. Management  plans discussed with the patient, daughters and they are in agreement.  CODE STATUS: Full Code  TOTAL TIME  TAKING CARE OF THIS PATIENT: 35 minutes.   More than 50% of the time was spent in counseling/coordination of care: YES  POSSIBLE D/C IN 1-2 DAYS, DEPENDING ON CLINICAL CONDITION.   Demetrios Loll M.D on 06/12/2018 at 2:17 PM  Between 7am to 6pm - Pager - [phone]  After 6pm go to [email or web address] - [facility]

## 2018-06-13 LAB — GLUCOSE, CAPILLARY
Glucose-Capillary: 156 mg/dL — ABNORMAL HIGH (ref 70–99)
Glucose-Capillary: 297 mg/dL — ABNORMAL HIGH (ref 70–99)

## 2018-06-13 LAB — POTASSIUM: Potassium: 3.7 mmol/L (ref 3.5–5.1)

## 2018-06-13 LAB — MAGNESIUM: Magnesium: 1.8 mg/dL (ref 1.7–2.4)

## 2018-06-13 LAB — PHOSPHORUS: Phosphorus: 2.9 mg/dL (ref 2.5–4.6)

## 2018-06-13 MED ORDER — METOPROLOL TARTRATE 37.5 MG PO TABS: 37.5000 mg | ORAL_TABLET | Freq: Two times a day (BID) | ORAL | 1 refills | Status: DC

## 2018-06-13 MED ORDER — ADULT MULTIVITAMIN LIQUID CH: 15.0000 mL | Freq: Every day | ORAL | 1 refills | Status: AC

## 2018-06-13 MED ORDER — ALLOPURINOL 20 MG/ML ORAL SUSPENSION: 100.0000 mg | Freq: Every day | ORAL | 1 refills | Status: DC

## 2018-06-13 MED ORDER — SERTRALINE HCL 20 MG/ML PO CONC: 25.0000 mg | Freq: Every day | ORAL | 1 refills | Status: DC

## 2018-06-13 MED ORDER — LISINOPRIL 40 MG PO TABS: 40.0000 mg | ORAL_TABLET | Freq: Every day | ORAL | 1 refills | Status: DC

## 2018-06-13 MED ORDER — FREE WATER: 30.0000 mL | Status: DC

## 2018-06-13 MED ORDER — HYDROCHLOROTHIAZIDE 25 MG PO TABS: 25.0000 mg | ORAL_TABLET | Freq: Every day | ORAL | 1 refills | Status: DC

## 2018-06-13 MED ORDER — GLIPIZIDE 5 MG PO TABS: 5.0000 mg | ORAL_TABLET | Freq: Every day | ORAL | Status: DC

## 2018-06-13 MED ORDER — FUROSEMIDE 10 MG/ML PO SOLN: 40.0000 mg | Freq: Two times a day (BID) | ORAL | 2 refills | Status: DC

## 2018-06-13 MED ORDER — MORPHINE SULFATE (PF) 2 MG/ML IV SOLN
2.0000 mg | Freq: Once | INTRAVENOUS | Status: AC
  Administered 2018-06-13: 2 mg via INTRAVENOUS
  Filled 2018-06-13: qty 1

## 2018-06-13 MED ORDER — ACETAMINOPHEN 160 MG/5ML PO SOLN: 650.0000 mg | Freq: Four times a day (QID) | ORAL | 0 refills | Status: AC | PRN

## 2018-06-13 MED ORDER — CLOPIDOGREL BISULFATE 75 MG PO TABS: 75.0000 mg | ORAL_TABLET | Freq: Every day | ORAL | 1 refills | Status: DC

## 2018-06-13 MED ORDER — PANTOPRAZOLE SODIUM 40 MG PO PACK: 40.0000 mg | PACK | Freq: Two times a day (BID) | ORAL | 1 refills | Status: DC

## 2018-06-13 MED ORDER — SUCRALFATE 1 GM/10ML PO SUSP: 1.0000 g | Freq: Three times a day (TID) | ORAL | 0 refills | Status: DC | PRN

## 2018-06-13 MED ORDER — HYDRALAZINE HCL 50 MG PO TABS: 50.0000 mg | ORAL_TABLET | Freq: Two times a day (BID) | ORAL | Status: DC

## 2018-06-13 MED ORDER — OSMOLITE 1.5 CAL PO LIQD
237.0000 mL | Freq: Four times a day (QID) | ORAL | Status: DC
  Administered 2018-06-13: 237 mL

## 2018-06-13 MED ORDER — OSMOLITE 1.5 CAL PO LIQD: 237.0000 mL | Freq: Four times a day (QID) | ORAL | 0 refills | Status: DC

## 2018-06-13 MED ORDER — ASCORBIC ACID 250 MG PO TABS: 250.0000 mg | ORAL_TABLET | Freq: Two times a day (BID) | ORAL | 1 refills | Status: DC

## 2018-06-13 MED ORDER — ROPINIROLE HCL 2 MG PO TABS: 2.0000 mg | ORAL_TABLET | Freq: Two times a day (BID) | ORAL | 1 refills | Status: DC

## 2018-06-13 MED ORDER — PRO-STAT SUGAR FREE PO LIQD: 30.0000 mL | Freq: Every day | ORAL | 1 refills | Status: DC

## 2018-06-13 NOTE — Care Management Note (Signed)
Case Management Note  Patient Details  Name: [name] Day MRN: [id number] Date of Birth: 08-18-1929   Patient to discharge today. POD 2 peg tube placement.  Patient lives at home alone.  2 adult daughters lives locally for support.  PCP Hande.  Patient denies issues obtaining medications.  At baseline patient ambulates with a cane.  Patient states she has a hospital; bed, WC, RW, and lift in the home from where she previously cared for her husband.  Patient agreeable to home health services.  Patient and family states that they do not have a preference of home health.  Referral made to [facility] with [facility].  Supplies to be delivered to the home this evening.   Subjective/Objective:                    Action/Plan:   Expected Discharge Date:  06/13/18               Expected Discharge Plan:  [place]  In-House Referral:     Discharge planning Services  CM Consult  Post Acute Care Choice:  Home Health, Durable Medical Equipment Choice offered to:  Patient  DME Arranged:  Tube feeding DME Agency:  [place] Arranged:  RN [facility] Agency:  [place]  Status of Service:  Completed, signed off  If discussed at [place] of Stay Meetings, dates discussed:    Additional Comments:  Beverly Sessions, RN 06/13/2018, 2:51 PM

## 2018-06-13 NOTE — Discharge Summary (Signed)
[place] at [facility] NAME: [name]    MR#:  [id number]  DATE OF BIRTH:  82-Oct-1930  DATE OF ADMISSION:  06/11/2018   ADMITTING PHYSICIAN: Vaughan Basta, MD  DATE OF DISCHARGE: 06/13/2018  PRIMARY CARE PHYSICIAN: [name], MD   ADMISSION DIAGNOSIS:  Esophageal dysphagia [R13.10] Odynophagia [R13.10] Esophageal stricture [K22.2] DISCHARGE DIAGNOSIS:  Active Problems:   Dysphagia   Esophageal stenosis   Esophageal stricture   Protein-calorie malnutrition, severe  SECONDARY DIAGNOSIS:   Past Medical History:  Diagnosis Date  . Anemia   . Aortic valve sclerosis   . Arrhythmia   . Arthritis    osteoarthritis  . Basal cell carcinoma of skin   . Brain tumor ([place])   . Brain tumor ([place])   . Cervical spine disease   . Diabetes mellitus without complication ([place])   . Dysrhythmia    sinus arrhythmia  . Esophageal ulcer without bleeding   . GERD (gastroesophageal reflux disease)   . History of kidney stones   . Hyperlipemia   . Hypertension   . Kidney stones   . Leaky heart valve   . Meningioma ([place])   . 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 Pulmonary hypertension [facility])    HOSPITAL COURSE:  *Esophageal stricture and dysphagia Status post PEG tube placement. Started tube feeding per dietitian's recommendation. Labs are unremarkable.  The patient did need home health with RN.  *Hypertension Resumed home hypertension medication per tube, IV hydralazine as needed.  *Diabetes on sliding scale coverage with insulin.  Resume glipizide after discharge.  Severe malnutrition.  Follow dietitian's recommendation and started tube feeding.  Generalized weakness.  PT evaluation: No PT follow-up.  DISCHARGE CONDITIONS:  Stable, discharge to home with home health today. CONSULTS OBTAINED:   DRUG ALLERGIES:   Allergies  Allergen Reactions  . Gabapentin Swelling  . Lipitor [Atorvastatin] Other (See  Comments)    Muscle aches  . Mevacor [Lovastatin] Other (See Comments)    Muscle aches  . Milk-Related Compounds     Large quantities cause headaches   . Septra [Sulfamethoxazole-Trimethoprim]   . Statins Other (See Comments)    Muscle pain  . Sulfur Diarrhea and Nausea And Vomiting  . Zocor [Simvastatin] Other (See Comments)    Muscle aches   DISCHARGE MEDICATIONS:   Allergies as of 06/13/2018      Reactions   Gabapentin Swelling   Lipitor [atorvastatin] Other (See Comments)   Muscle aches   Mevacor [lovastatin] Other (See Comments)   Muscle aches   Milk-related Compounds    Large quantities cause headaches    Septra [sulfamethoxazole-trimethoprim]    Statins Other (See Comments)   Muscle pain   Sulfur Diarrhea, Nausea And Vomiting   Zocor [simvastatin] Other (See Comments)   Muscle aches      Medication List    STOP taking these medications   acetaminophen 650 MG CR tablet Commonly known as:  TYLENOL Replaced by:  acetaminophen 160 MG/5ML solution   allopurinol 100 MG tablet Commonly known as:  ZYLOPRIM   DEXILANT 60 MG capsule Generic drug:  dexlansoprazole   furosemide 40 MG tablet Commonly known as:  LASIX Replaced by:  furosemide 10 MG/ML solution   metoprolol succinate 50 MG 24 hr tablet Commonly known as:  TOPROL-XL   pantoprazole 40 MG tablet Commonly known as:  PROTONIX Replaced by:  pantoprazole sodium 40 mg/20 mL Pack   sertraline 25 MG tablet Commonly known as:  ZOLOFT Replaced by:  sertraline 20 MG/ML concentrated solution   sucralfate 1 g tablet Commonly known as:  CARAFATE Replaced by:  sucralfate 1 GM/10ML suspension   vitamin B-12 1000 MCG tablet Commonly known as:  CYANOCOBALAMIN   Vitamin C Chew     TAKE these medications   acetaminophen 160 MG/5ML solution Commonly known as:  TYLENOL Place 20.3 mLs (650 mg total) into feeding tube every 6 (six) hours as needed for mild pain. Replaces:  acetaminophen 650 MG CR tablet     allopurinol 20 mg/mL Susp Commonly known as:  ZYLOPRIM Place 5 mLs (100 mg total) into feeding tube at bedtime.   ascorbic acid 250 MG tablet Commonly known as:  VITAMIN C Place 1 tablet (250 mg total) into feeding tube 2 (two) times daily.   clopidogrel 75 MG tablet Commonly known as:  PLAVIX Place 1 tablet (75 mg total) into feeding tube daily. What changed:  how to take this   feeding supplement (OSMOLITE 1.5 CAL) Liqd Place 237 mLs into feeding tube every 6 (six) hours.   feeding supplement (PRO-STAT SUGAR FREE 64) Liqd Place 30 mLs into feeding tube daily.   free water Soln Place 30 mLs into feeding tube every 4 (four) hours.   furosemide 10 MG/ML solution Commonly known as:  LASIX Place 4 mLs (40 mg total) into feeding tube 2 (two) times daily. Replaces:  furosemide 40 MG tablet   GENTEAL MILD 0.2 % Soln Generic drug:  Hypromellose Place 1 drop into both eyes 3 (three) times daily.   glipiZIDE 5 MG tablet Commonly known as:  GLUCOTROL Place 1 tablet (5 mg total) into feeding tube daily before breakfast. What changed:  how to take this   hydrALAZINE 50 MG tablet Commonly known as:  APRESOLINE Place 1 tablet (50 mg total) into feeding tube 2 (two) times daily. What changed:  how to take this   hydrochlorothiazide 25 MG tablet Commonly known as:  HYDRODIURIL Place 1 tablet (25 mg total) into feeding tube daily. What changed:  how to take this   lisinopril 40 MG tablet Commonly known as:  PRINIVIL,ZESTRIL Place 1 tablet (40 mg total) into feeding tube daily. What changed:  how to take this   Metoprolol Tartrate 37.5 MG Tabs Place 37.5 mg into feeding tube 2 (two) times daily.   multivitamin Liqd Place 15 mLs into feeding tube daily.   pantoprazole sodium 40 mg/20 mL Pack Commonly known as:  PROTONIX Place 20 mLs (40 mg total) into feeding tube 2 (two) times daily. Replaces:  pantoprazole 40 MG tablet   rOPINIRole 2 MG tablet Commonly known as:   REQUIP Place 1 tablet (2 mg total) into feeding tube 2 (two) times daily. What changed:  how to take this   sertraline 20 MG/ML concentrated solution Commonly known as:  ZOLOFT Place 1.3 mLs (26 mg total) into feeding tube daily. Replaces:  sertraline 25 MG tablet   sucralfate 1 GM/10ML suspension Commonly known as:  CARAFATE Place 10 mLs (1 g total) into feeding tube 3 (three) times daily as needed (for difficulty swallowing). Replaces:  sucralfate 1 g tablet        DISCHARGE INSTRUCTIONS:  See AVS.  If you experience worsening of your admission symptoms, develop shortness of breath, life threatening emergency, suicidal or homicidal thoughts you must seek medical attention immediately by calling 911 or calling your MD immediately  if symptoms less severe.  You Must read complete instructions/literature along with all the possible adverse  reactions/side effects for all the Medicines you take and that have been prescribed to you. Take any new Medicines after you have completely understood and accpet all the possible adverse reactions/side effects.   Please note  You were cared for by a hospitalist during your hospital stay. If you have any questions about your discharge medications or the care you received while you were in the hospital after you are discharged, you can call the unit and asked to speak with the hospitalist on call if the hospitalist that took care of you is not available. Once you are discharged, your primary care physician will handle any further medical issues. Please note that NO REFILLS for any discharge medications will be authorized once you are discharged, as it is imperative that you return to your primary care physician (or establish a relationship with a primary care physician if you do not have one) for your aftercare needs so that they can reassess your need for medications and monitor your lab values.    On the [name] of Discharge:  VITAL SIGNS:  Blood  pressure (!) 115/58, pulse 62, temperature 98.4 F (36.9 C), temperature source Oral, resp. rate 16, height 5\' 3"  (1.6 m), weight 53.5 kg, SpO2 99 %. PHYSICAL EXAMINATION:  GENERAL:  84 y.o.-year-old patient lying in the bed with no acute distress.  Severe malnutrition. EYES: Pupils equal, round, reactive to light and accommodation. No scleral icterus. Extraocular muscles intact.  HEENT: Head atraumatic, normocephalic. Oropharynx and nasopharynx clear.  NECK:  Supple, no jugular venous distention. No thyroid enlargement, no tenderness.  LUNGS: Normal breath sounds bilaterally, no wheezing, rales,rhonchi or crepitation. No use of accessory muscles of respiration.  CARDIOVASCULAR: S1, S2 normal. No murmurs, rubs, or gallops.  ABDOMEN: Soft, non-tender, non-distended. Bowel sounds present. No organomegaly or mass.  PEG in situ. EXTREMITIES: No pedal edema, cyanosis, or clubbing.  NEUROLOGIC: Cranial nerves II through XII are intact. Muscle strength 5/5 in all extremities. Sensation intact. Gait not checked.  PSYCHIATRIC: The patient is alert and oriented x 3.  SKIN: No obvious rash, lesion, or ulcer.  DATA REVIEW:   CBC Recent Labs  Lab 06/12/18 0332  WBC 7.7  HGB 10.4*  HCT 30.9*  PLT 248    Chemistries  Recent Labs  Lab 06/12/18 0332 06/13/18 0429  NA 142  --   K 3.8 3.7  CL 109  --   CO2 26  --   GLUCOSE 73  --   BUN 18  --   CREATININE 0.68  --   CALCIUM 8.4*  --   MG  --  1.8     Microbiology Results  Results for orders placed or performed in visit on 06/14/17  Urine Culture     Status: Abnormal   Collection Time: 06/14/17  1:55 PM  Result Value Ref Range Status   Specimen Description URINE, CLEAN CATCH  Final   Special Requests NONE  Final   Culture >=100,000 COLONIES/mL KLEBSIELLA PNEUMONIAE (A)  Final   Report Status 06/16/2017 FINAL  Final   Organism ID, Bacteria KLEBSIELLA PNEUMONIAE (A)  Final      Susceptibility   Klebsiella pneumoniae - MIC*     AMPICILLIN >=32 RESISTANT Resistant     CEFAZOLIN <=4 SENSITIVE Sensitive     CEFTRIAXONE <=1 SENSITIVE Sensitive     CIPROFLOXACIN 0.5 SENSITIVE Sensitive     GENTAMICIN <=1 SENSITIVE Sensitive     IMIPENEM <=0.25 SENSITIVE Sensitive     NITROFURANTOIN 128 RESISTANT Resistant  TRIMETH/SULFA <=20 SENSITIVE Sensitive     AMPICILLIN/SULBACTAM 16 INTERMEDIATE Intermediate     PIP/TAZO 16 SENSITIVE Sensitive     Extended ESBL NEGATIVE Sensitive     * >=100,000 COLONIES/mL KLEBSIELLA PNEUMONIAE    RADIOLOGY:  No results found.   Management plans discussed with the patient, 2 daughters and they are in agreement.  CODE STATUS: Full Code   TOTAL TIME TAKING CARE OF THIS PATIENT: 32 minutes.    Demetrios Loll M.D on 06/13/2018 at 2:55 PM  Between 7am to 6pm - Pager - [phone]  After 6pm go to [email or web address] - Proofreader  [facility]  Office  [phone]  CC: Primary care physician; [name], MD   Note: This dictation was prepared with Dragon dictation along with smaller phrase technology. Any transcriptional errors that result from this process are unintentional.

## 2018-06-13 NOTE — Evaluation (Signed)
Physical Therapy Evaluation Patient Details Name: [name] Day MRN: [id number] DOB: January 29, 1929 Today's Date: 06/13/2018   History of Present Illness  84 y/o female here with dysphasia, espophageal constricture. Pt had PEG tube placement 9/11.  Clinical Impression  Pt did well with PT exam and showed good safety and confidence with ambulation using QC (which is baseline).  Pt reports she is walking minimally slower than baseline but overall is safe and appropriate for return home - and daughter reports they will be around more than normal and facilitate her activity level.     Follow Up Recommendations No PT follow up    Equipment Recommendations  None recommended by PT    Recommendations for Other Services       Precautions / Restrictions Precautions Precautions: Fall Restrictions Weight Bearing Restrictions: No      Mobility  Bed Mobility Overal bed mobility: Independent             General bed mobility comments: Pt easily gets to EOB w/o assist  Transfers Overall transfer level: Independent Equipment used: Scientist, research (medical) transfer comment: Pt uses quad cane at baseline, able to rise with out physical assist  Ambulation/Gait Ambulation/Gait assistance: Independent Gait Distance (Feet): 250 Feet Assistive device: Rolling walker (2 wheeled)       General Gait Details: Pt is able to ambulate with good confidence and safety. She (and daughter) indicate that she is walking slower than baseline.  Stairs            Wheelchair Mobility    Modified Rankin (Stroke Patients Only)       Balance Overall balance assessment: Independent                                           Pertinent Vitals/Pain Pain Assessment: 0-10 Pain Score: 3  Pain Location: incision site    Home Living Family/patient expects to be discharged to:: Private residence Living Arrangements: Alone Available Help at Discharge:  Family;Available PRN/intermittently(daughters check in only PRN) Type of Home: House Home Access: Ramped entrance     Home Layout: One level Home Equipment: [place] - 2 wheels;Walker - 4 wheels;Cane - single point;Cane - quad      Prior Function Level of Independence: Independent         Comments: Pt still drives, does light house work and is generally able     Hand Dominance        Extremity/Trunk Assessment                Communication   Communication: No difficulties  Cognition Arousal/Alertness: Awake/alert Behavior During Therapy: WFL for tasks assessed/performed Overall Cognitive Status: Within Functional Limits for tasks assessed                                        General Comments      Exercises     Assessment/Plan    PT Assessment Patent does not need any further PT services  PT Problem List         PT Treatment Interventions      PT Goals (Current goals can be found in the Care Plan section)  Acute Rehab PT Goals Patient Stated Goal: go  home PT Goal Formulation: All assessment and education complete, DC therapy    Frequency     Barriers to discharge        Co-evaluation               AM-PAC PT "6 Clicks" Daily Activity  Outcome Measure Difficulty turning over in bed (including adjusting bedclothes, sheets and blankets)?: None Difficulty moving from lying on back to sitting on the side of the bed? : None Difficulty sitting down on and standing up from a chair with arms (e.g., wheelchair, bedside commode, etc,.)?: None Help needed moving to and from a bed to chair (including a wheelchair)?: None Help needed walking in hospital room?: None Help needed climbing 3-5 steps with a railing? : None 6 Click Score: 24    End of Session Equipment Utilized During Treatment: Gait belt Activity Tolerance: Patient tolerated treatment well Patient left: with chair alarm set;with call bell/phone within reach;with  family/visitor present Nurse Communication: Mobility status PT Visit Diagnosis: Muscle weakness (generalized) (M62.81);Difficulty in walking, not elsewhere classified (R26.2)    Time: 1219-7588 PT Time Calculation (min) (ACUTE ONLY): 18 min   Charges:   PT Evaluation $PT Eval Low Complexity: 1 Low          Kreg Shropshire, DPT 06/13/2018, 9:50 AM

## 2018-06-13 NOTE — Discharge Instructions (Signed)
HHPT  Plavix 75 mg, Per Tube, Daily, First dose on Thu 06/12/18 at 1800, Tablets do not disperse readily in water - they can be crushed (although difficult due to coating) and mixed in water and resulting suspension can be flushed down a 8 Fr NG tube without blockage (larger tubes fine as well) 1. Stop the enteral feed. 2. Flush the enteral feeding tube with the recommended volume of water. 3. Place the tablet in mortar and crush to a fine powder using the pestle. 4. Add a few millilitres of water and mix to form a paste. 5. Add up to 15 mL of water and mix thoroughly ensuring that there are no large particles of tablet. 6. Draw this into an appropriate size and type of syringe. 7. Flush the medication dose down the feeding tube. 8. Add another 15 mL of water to the mortar and stir to ensure that any remaining drug is rinsed from the container. Draw this water into the syringe and also flush this via the feeding tube (this will rinse the mortar and syringe and ensure that the total dose is administered). 9. Finally, flush the enteral feeding tube with the recommended volume of water. 10. Re-start the feed, unless a prolonged break is required.

## 2018-06-13 NOTE — Progress Notes (Signed)
Nutrition Brief Note   Pt tolerated feedings overnight; reports some mild nausea after free water flush but no actual vomiting. Pt sitting up in chair this morning and in good spirits. Refeeding labs wnl this morning; will plan to advance to goal today. Spoke to pt and family this morning. Will plan to try to give a full can at 1200 feeding today. If pt not able to tolerate feeding, advised she can continue with 1/2 can and advance to goal as tolerated at home. [facility] will be following. Pt also has an appointment with the Dietitian at the cancer center on October 3rd who can check her progress as well.   Labs reviewed: K 3.7 wnl, P 2.9 wnl, Mg 1.8 wnl   INTERVENTION:   Osmolite 1.5- Give 4 cans daily- Please flush with 52ml of water before and after each tube feeding.   Prostat liquid protein 30 ml daily via tube  Liquid MVI daily via tube   Vitamin C 250mg  BID via tube x 30 days  Free water flushes 45ml q 4 hrs  Regimen provides 1520kcal/day, 75g/day protein, 1377ml/day   Koleen Distance MS, RD, LDN Pager #- [phone] Office#- [phone] After Hours Pager: [phone]

## 2018-06-20 ENCOUNTER — Telehealth: Payer: Self-pay

## 2018-06-20 NOTE — Telephone Encounter (Signed)
Flagged on EMMI report for having unfilled prescriptions.  Called and spoke with daughter who informed me they had most of them filled, working on a few others.  Reported patient had to go back to the doctor and received another prescription as well, but was able to get samples to help.  No other questions or issues at this time.

## 2018-06-23 ENCOUNTER — Encounter: Payer: Self-pay | Admitting: Emergency Medicine

## 2018-06-23 ENCOUNTER — Emergency Department: Payer: Medicare Other

## 2018-06-23 ENCOUNTER — Inpatient Hospital Stay
Admission: EM | Admit: 2018-06-23 | Discharge: 2018-06-25 | DRG: 640 | Disposition: A | Discharge status: Home Health | Payer: Medicare Other | Attending: Internal Medicine | Admitting: Internal Medicine

## 2018-06-23 ENCOUNTER — Other Ambulatory Visit: Payer: Self-pay

## 2018-06-23 DIAGNOSIS — Z809 Family history of malignant neoplasm, unspecified: Secondary | ICD-10-CM

## 2018-06-23 DIAGNOSIS — E860 Dehydration: Secondary | ICD-10-CM | POA: Diagnosis present

## 2018-06-23 DIAGNOSIS — Z833 Family history of diabetes mellitus: Secondary | ICD-10-CM

## 2018-06-23 DIAGNOSIS — D509 Iron deficiency anemia, unspecified: Secondary | ICD-10-CM | POA: Diagnosis present

## 2018-06-23 DIAGNOSIS — R001 Bradycardia, unspecified: Secondary | ICD-10-CM | POA: Diagnosis present

## 2018-06-23 DIAGNOSIS — Z823 Family history of stroke: Secondary | ICD-10-CM | POA: Diagnosis not present

## 2018-06-23 DIAGNOSIS — Z7984 Long term (current) use of oral hypoglycemic drugs: Secondary | ICD-10-CM

## 2018-06-23 DIAGNOSIS — E871 Hypo-osmolality and hyponatremia: Secondary | ICD-10-CM | POA: Diagnosis present

## 2018-06-23 DIAGNOSIS — I10 Essential (primary) hypertension: Secondary | ICD-10-CM | POA: Diagnosis present

## 2018-06-23 DIAGNOSIS — Z888 Allergy status to other drugs, medicaments and biological substances status: Secondary | ICD-10-CM

## 2018-06-23 DIAGNOSIS — Z7902 Long term (current) use of antithrombotics/antiplatelets: Secondary | ICD-10-CM

## 2018-06-23 DIAGNOSIS — Z79899 Other long term (current) drug therapy: Secondary | ICD-10-CM

## 2018-06-23 DIAGNOSIS — Z681 Body mass index (BMI) 19 or less, adult: Secondary | ICD-10-CM | POA: Diagnosis not present

## 2018-06-23 DIAGNOSIS — Z85828 Personal history of other malignant neoplasm of skin: Secondary | ICD-10-CM

## 2018-06-23 DIAGNOSIS — Z8719 Personal history of other diseases of the digestive system: Secondary | ICD-10-CM

## 2018-06-23 DIAGNOSIS — Z8249 Family history of ischemic heart disease and other diseases of the circulatory system: Secondary | ICD-10-CM | POA: Diagnosis not present

## 2018-06-23 DIAGNOSIS — Z931 Gastrostomy status: Secondary | ICD-10-CM | POA: Diagnosis not present

## 2018-06-23 DIAGNOSIS — Z87442 Personal history of urinary calculi: Secondary | ICD-10-CM

## 2018-06-23 DIAGNOSIS — Z90710 Acquired absence of both cervix and uterus: Secondary | ICD-10-CM | POA: Diagnosis not present

## 2018-06-23 DIAGNOSIS — I272 Pulmonary hypertension, unspecified: Secondary | ICD-10-CM | POA: Diagnosis present

## 2018-06-23 DIAGNOSIS — K219 Gastro-esophageal reflux disease without esophagitis: Secondary | ICD-10-CM | POA: Diagnosis present

## 2018-06-23 DIAGNOSIS — E785 Hyperlipidemia, unspecified: Secondary | ICD-10-CM | POA: Diagnosis present

## 2018-06-23 DIAGNOSIS — Z91011 Allergy to milk products: Secondary | ICD-10-CM | POA: Diagnosis not present

## 2018-06-23 DIAGNOSIS — M1990 Unspecified osteoarthritis, unspecified site: Secondary | ICD-10-CM | POA: Diagnosis present

## 2018-06-23 DIAGNOSIS — Z881 Allergy status to other antibiotic agents status: Secondary | ICD-10-CM

## 2018-06-23 DIAGNOSIS — I358 Other nonrheumatic aortic valve disorders: Secondary | ICD-10-CM | POA: Diagnosis present

## 2018-06-23 DIAGNOSIS — E1151 Type 2 diabetes mellitus with diabetic peripheral angiopathy without gangrene: Secondary | ICD-10-CM | POA: Diagnosis present

## 2018-06-23 DIAGNOSIS — Z86011 Personal history of benign neoplasm of the brain: Secondary | ICD-10-CM

## 2018-06-23 DIAGNOSIS — K222 Esophageal obstruction: Secondary | ICD-10-CM | POA: Diagnosis present

## 2018-06-23 DIAGNOSIS — E43 Unspecified severe protein-calorie malnutrition: Secondary | ICD-10-CM | POA: Diagnosis present

## 2018-06-23 LAB — CREATININE, URINE, RANDOM: Creatinine, Urine: 23 mg/dL

## 2018-06-23 LAB — SODIUM
Sodium: 123 mmol/L — ABNORMAL LOW (ref 135–145)
Sodium: 124 mmol/L — ABNORMAL LOW (ref 135–145)

## 2018-06-23 LAB — COMPREHENSIVE METABOLIC PANEL
ALT: 16 U/L (ref 0–44)
AST: 20 U/L (ref 15–41)
Albumin: 3.2 g/dL — ABNORMAL LOW (ref 3.5–5.0)
Alkaline Phosphatase: 61 U/L (ref 38–126)
Anion gap: 9 (ref 5–15)
BUN: 80 mg/dL — ABNORMAL HIGH (ref 8–23)
CO2: 34 mmol/L — ABNORMAL HIGH (ref 22–32)
Calcium: 8.4 mg/dL — ABNORMAL LOW (ref 8.9–10.3)
Chloride: 78 mmol/L — ABNORMAL LOW (ref 98–111)
Creatinine, Ser: 1.02 mg/dL — ABNORMAL HIGH (ref 0.44–1.00)
GFR calc Af Amer: 56 mL/min — ABNORMAL LOW (ref >60)
GFR calc non Af Amer: 48 mL/min — ABNORMAL LOW (ref >60)
Glucose, Bld: 122 mg/dL — ABNORMAL HIGH (ref 70–99)
Potassium: 4.3 mmol/L (ref 3.5–5.1)
Sodium: 121 mmol/L — ABNORMAL LOW (ref 135–145)
Total Bilirubin: 0.5 mg/dL (ref 0.3–1.2)
Total Protein: 6.3 g/dL — ABNORMAL LOW (ref 6.5–8.1)

## 2018-06-23 LAB — CBC
HCT: 29.7 % — ABNORMAL LOW (ref 35.0–47.0)
Hemoglobin: 10.4 g/dL — ABNORMAL LOW (ref 12.0–16.0)
MCH: 30.1 pg (ref 26.0–34.0)
MCHC: 35.1 g/dL (ref 32.0–36.0)
MCV: 85.8 fL (ref 80.0–100.0)
Platelets: 400 10*3/uL (ref 150–440)
RBC: 3.46 MIL/uL — ABNORMAL LOW (ref 3.80–5.20)
RDW: 13.9 % (ref 11.5–14.5)
WBC: 10.6 10*3/uL (ref 3.6–11.0)

## 2018-06-23 LAB — GLUCOSE, CAPILLARY
Glucose-Capillary: 99 mg/dL (ref 70–99)
Glucose-Capillary: 216 mg/dL — ABNORMAL HIGH (ref 70–99)

## 2018-06-23 LAB — SODIUM, URINE, RANDOM: Sodium, Ur: <10 mmol/L

## 2018-06-23 LAB — CHLORIDE, URINE, RANDOM: Chloride Urine: <15 mmol/L

## 2018-06-23 LAB — TROPONIN I: Troponin I: <0.03 ng/mL (ref <0.03)

## 2018-06-23 MED ORDER — OSMOLITE 1.5 CAL PO LIQD
237.0000 mL | Freq: Four times a day (QID) | ORAL | Status: DC
  Administered 2018-06-23 – 2018-06-24 (×3): 237 mL

## 2018-06-23 MED ORDER — MORPHINE SULFATE (PF) 2 MG/ML IV SOLN
2.0000 mg | Freq: Once | INTRAVENOUS | Status: AC
  Administered 2018-06-23: 2 mg via INTRAVENOUS
  Filled 2018-06-23: qty 1

## 2018-06-23 MED ORDER — MECLIZINE HCL 25 MG PO TABS
25.0000 mg | ORAL_TABLET | Freq: Three times a day (TID) | ORAL | Status: DC | PRN
  Filled 2018-06-23: qty 1

## 2018-06-23 MED ORDER — PANTOPRAZOLE SODIUM 40 MG PO PACK
40.0000 mg | PACK | Freq: Two times a day (BID) | ORAL | Status: DC
  Administered 2018-06-23 – 2018-06-25 (×4): 40 mg
  Filled 2018-06-23 (×5): qty 20

## 2018-06-23 MED ORDER — PRO-STAT SUGAR FREE PO LIQD
30.0000 mL | Freq: Every day | ORAL | Status: DC
  Administered 2018-06-23 – 2018-06-25 (×3): 30 mL

## 2018-06-23 MED ORDER — ADULT MULTIVITAMIN LIQUID CH
15.0000 mL | Freq: Every day | ORAL | Status: DC
  Administered 2018-06-23 – 2018-06-25 (×3): 15 mL
  Filled 2018-06-23 (×3): qty 15

## 2018-06-23 MED ORDER — SERTRALINE HCL 20 MG/ML PO CONC
25.0000 mg | Freq: Every day | ORAL | Status: DC
  Administered 2018-06-23 – 2018-06-25 (×3): 25 mg
  Filled 2018-06-23 (×3): qty 1.25

## 2018-06-23 MED ORDER — ONDANSETRON HCL 4 MG/2ML IJ SOLN: 4.0000 mg | Freq: Four times a day (QID) | INTRAMUSCULAR | Status: DC | PRN

## 2018-06-23 MED ORDER — TRAMADOL 5 MG/ML ORAL SUSPENSION: 50.0000 mg | Freq: Four times a day (QID) | ORAL | Status: DC | PRN

## 2018-06-23 MED ORDER — ROPINIROLE HCL 1 MG PO TABS
2.0000 mg | ORAL_TABLET | Freq: Two times a day (BID) | ORAL | Status: DC
  Administered 2018-06-23 – 2018-06-25 (×4): 2 mg
  Filled 2018-06-23 (×4): qty 2

## 2018-06-23 MED ORDER — VITAMIN C 500 MG PO TABS
250.0000 mg | ORAL_TABLET | Freq: Two times a day (BID) | ORAL | Status: DC
  Administered 2018-06-23 – 2018-06-25 (×4): 250 mg
  Filled 2018-06-23 (×4): qty 1

## 2018-06-23 MED ORDER — LIDOCAINE 5 % EX PTCH
1.0000 | MEDICATED_PATCH | CUTANEOUS | Status: DC
  Administered 2018-06-24 – 2018-06-25 (×2): 1 via TRANSDERMAL
  Filled 2018-06-23 (×2): qty 1

## 2018-06-23 MED ORDER — ACETAMINOPHEN 160 MG/5ML PO SOLN
650.0000 mg | Freq: Four times a day (QID) | ORAL | Status: DC | PRN
  Filled 2018-06-23: qty 20.3

## 2018-06-23 MED ORDER — METOPROLOL TARTRATE 25 MG PO TABS
37.5000 mg | ORAL_TABLET | Freq: Two times a day (BID) | ORAL | Status: DC
  Administered 2018-06-23: 37.5 mg
  Filled 2018-06-23 (×2): qty 2

## 2018-06-23 MED ORDER — TRAMADOL HCL 50 MG PO TABS: 50.0000 mg | ORAL_TABLET | Freq: Four times a day (QID) | ORAL | Status: DC | PRN

## 2018-06-23 MED ORDER — ENOXAPARIN SODIUM 40 MG/0.4ML ~~LOC~~ SOLN
40.0000 mg | SUBCUTANEOUS | Status: DC
  Administered 2018-06-23: 40 mg via SUBCUTANEOUS
  Filled 2018-06-23: qty 0.4

## 2018-06-23 MED ORDER — INSULIN ASPART 100 UNIT/ML ~~LOC~~ SOLN
0.0000 [IU] | Freq: Every day | SUBCUTANEOUS | Status: DC
  Administered 2018-06-23: 2 [IU] via SUBCUTANEOUS
  Filled 2018-06-23: qty 1

## 2018-06-23 MED ORDER — INSULIN ASPART 100 UNIT/ML ~~LOC~~ SOLN
0.0000 [IU] | Freq: Three times a day (TID) | SUBCUTANEOUS | Status: DC
  Administered 2018-06-24: 1 [IU] via SUBCUTANEOUS
  Administered 2018-06-24: 7 [IU] via SUBCUTANEOUS
  Administered 2018-06-24 – 2018-06-25 (×2): 2 [IU] via SUBCUTANEOUS
  Filled 2018-06-23 (×4): qty 1

## 2018-06-23 MED ORDER — PANTOPRAZOLE SODIUM 40 MG PO PACK
40.0000 mg | PACK | Freq: Every day | ORAL | Status: DC
  Filled 2018-06-23: qty 20

## 2018-06-23 MED ORDER — LANSOPRAZOLE 30 MG PO TBDP: 30.0000 mg | ORAL_TABLET | Freq: Every day | ORAL | Status: DC

## 2018-06-23 MED ORDER — SODIUM CHLORIDE 0.9 % IV SOLN
INTRAVENOUS | Status: AC
  Administered 2018-06-23 – 2018-06-24 (×2): via INTRAVENOUS

## 2018-06-23 MED ORDER — LISINOPRIL 20 MG PO TABS
40.0000 mg | ORAL_TABLET | Freq: Every day | ORAL | Status: DC
  Filled 2018-06-23: qty 2

## 2018-06-23 MED ORDER — SUCRALFATE 1 GM/10ML PO SUSP: 1.0000 g | Freq: Three times a day (TID) | ORAL | Status: DC | PRN

## 2018-06-23 MED ORDER — MORPHINE SULFATE (PF) 2 MG/ML IV SOLN
2.0000 mg | Freq: Four times a day (QID) | INTRAVENOUS | Status: DC | PRN
  Administered 2018-06-23: 2 mg via INTRAVENOUS
  Filled 2018-06-23: qty 1

## 2018-06-23 MED ORDER — HYDRALAZINE HCL 50 MG PO TABS
50.0000 mg | ORAL_TABLET | Freq: Two times a day (BID) | ORAL | Status: DC
  Administered 2018-06-23 – 2018-06-25 (×2): 50 mg
  Filled 2018-06-23 (×4): qty 1

## 2018-06-23 MED ORDER — CLOPIDOGREL BISULFATE 75 MG PO TABS
75.0000 mg | ORAL_TABLET | Freq: Every day | ORAL | Status: DC
  Administered 2018-06-24 – 2018-06-25 (×2): 75 mg
  Filled 2018-06-23 (×2): qty 1

## 2018-06-23 MED ORDER — ONDANSETRON HCL 4 MG PO TABS: 4.0000 mg | ORAL_TABLET | Freq: Four times a day (QID) | ORAL | Status: DC | PRN

## 2018-06-23 MED ORDER — FREE WATER
30.0000 mL | Status: DC
  Administered 2018-06-23 – 2018-06-25 (×11): 30 mL

## 2018-06-23 MED ORDER — POLYVINYL ALCOHOL 1.4 % OP SOLN
1.0000 [drp] | Freq: Three times a day (TID) | OPHTHALMIC | Status: DC | PRN
  Filled 2018-06-23: qty 15

## 2018-06-23 MED ORDER — ALLOPURINOL 20 MG/ML ORAL SUSPENSION
100.0000 mg | Freq: Every day | ORAL | Status: DC
  Administered 2018-06-23 – 2018-06-24 (×2): 100 mg
  Filled 2018-06-23 (×3): qty 5

## 2018-06-23 MED ORDER — GLIPIZIDE 5 MG PO TABS
5.0000 mg | ORAL_TABLET | Freq: Every day | ORAL | Status: DC
  Administered 2018-06-24 – 2018-06-25 (×2): 5 mg
  Filled 2018-06-23 (×2): qty 1

## 2018-06-23 NOTE — ED Triage Notes (Addendum)
Notified of low Na by MD. Patient had PEG tube and has been getting 1564ml water in addition to feeding. Feeling weak. States has L lower anterior chest pain. Per daughter has known Rib fracture on L.

## 2018-06-23 NOTE — ED Notes (Signed)
ED TO INPATIENT HANDOFF REPORT  Name/Age/Gender Genella Mech Day 84 y.o. female  Code Status Code Status History    Date Active Date Inactive Code Status Order ID Comments User Context   06/11/2018 1559 06/13/2018 1803 Full Code [id number]  Vaughan Basta, MD Inpatient   03/18/2017 1033 03/18/2017 1544 Full Code [id number]  Algernon Huxley, MD Inpatient    Advance Directive Documentation     Most Recent Value  Type of Advance Directive  Healthcare Power of Attorney  Pre-existing out of facility DNR order (yellow form or pink MOST form)  -  "MOST" Form in Place?  -      Home/SNF/Other Home  Chief Complaint Abnormal Labs   Level of Care/Admitting Diagnosis ED Disposition    ED Disposition Condition [place]: [place] [[id number]]  Level of Care: Med-Surg [16]  Diagnosis: Hyponatremia [366440]  Admitting Physician: Nicholes Mango [[id number]]  Attending Physician: Nicholes Mango [[id number]]  Estimated length of stay: 3 - 4 days  Certification:: I certify this patient will need inpatient services for at least 2 midnights  Bed request comments: 1c/2c  PT Class (Do Not Modify): Inpatient [101]  PT Acc Code (Do Not Modify): Private [1]       Medical History Past Medical History:  Diagnosis Date  . Anemia   . Aortic valve sclerosis   . Arrhythmia   . Arthritis    osteoarthritis  . Basal cell carcinoma of skin   . Brain tumor ([place])   . Brain tumor ([place])   . Cervical spine disease   . Diabetes mellitus without complication ([place])   . Dysrhythmia    sinus arrhythmia  . Esophageal ulcer without bleeding   . GERD (gastroesophageal reflux disease)   . History of kidney stones   . Hyperlipemia   . Hypertension   . Kidney stones   . Leaky heart valve   . Meningioma ([place])   . 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 Pulmonary hypertension ([facility])     Allergies Allergies  Allergen Reactions  . Gabapentin Swelling  . Lipitor [Atorvastatin]  Other (See Comments)    Muscle aches  . Mevacor [Lovastatin] Other (See Comments)    Muscle aches  . Milk-Related Compounds     Large quantities cause headaches   . Septra [Sulfamethoxazole-Trimethoprim]   . Statins Other (See Comments)    Muscle pain  . Sulfur Diarrhea and Nausea And Vomiting  . Zocor [Simvastatin] Other (See Comments)    Muscle aches    IV Location/Drains/Wounds Patient Lines/Drains/Airways Status   Active Line/Drains/Airways    Name:   Placement date:   Placement time:   Site:   Days:   Peripheral IV 06/23/18 Left Antecubital   06/23/18    1415    Antecubital   less than 1   Gastrostomy/Enterostomy Gastrostomy 18 Fr. LUQ   06/11/18    1417    LUQ   12   Airway   03/28/17    0842     452   Airway   12/05/17    1323     200          Labs/Imaging Results for orders placed or performed during the hospital encounter of 06/23/18 (from the past 48 hour(s))  CBC     Status: Abnormal   Collection Time: 06/23/18 12:43 PM  Result Value Ref Range   WBC 10.6 3.6 - 11.0 K/uL   RBC 3.46 (L) 3.80 -  5.20 MIL/uL   Hemoglobin 10.4 (L) 12.0 - 16.0 g/dL   HCT 29.7 (L) 35.0 - 47.0 %   MCV 85.8 80.0 - 100.0 fL   MCH 30.1 26.0 - 34.0 pg   MCHC 35.1 32.0 - 36.0 g/dL   RDW 13.9 11.5 - 14.5 %   Platelets 400 150 - 440 K/uL    Comment: Performed at [facility], [address] [place], [place] [address]  Troponin I     Status: None   Collection Time: 06/23/18 12:43 PM  Result Value Ref Range   Troponin I <0.03 <0.03 ng/mL    Comment: Performed at [facility], [address] [place], [place] [address]  Comprehensive metabolic panel     Status: Abnormal   Collection Time: 06/23/18 12:43 PM  Result Value Ref Range   Sodium 121 (L) 135 - 145 mmol/L   Potassium 4.3 3.5 - 5.1 mmol/L   Chloride 78 (L) 98 - 111 mmol/L   CO2 34 (H) 22 - 32 mmol/L   Glucose, Bld 122 (H) 70 - 99 mg/dL   BUN 80 (H) 8 - 23 mg/dL   Creatinine, Ser 1.02 (H) 0.44 - 1.00 mg/dL    Calcium 8.4 (L) 8.9 - 10.3 mg/dL   Total Protein 6.3 (L) 6.5 - 8.1 g/dL   Albumin 3.2 (L) 3.5 - 5.0 g/dL   AST 20 15 - 41 U/L   ALT 16 0 - 44 U/L   Alkaline Phosphatase 61 38 - 126 U/L   Total Bilirubin 0.5 0.3 - 1.2 mg/dL   GFR calc non Af Amer 48 (L) >60 mL/min   GFR calc Af Amer 56 (L) >60 mL/min    Comment: (NOTE) The eGFR has been calculated using the CKD EPI equation. This calculation has not been validated in all clinical situations. eGFR's persistently <60 mL/min signify possible Chronic Kidney Disease.    Anion gap 9 5 - 15    Comment: Performed at [facility], [address] [place],  [address]   Dg Chest 2 View  Result Date: 06/23/2018 CLINICAL DATA:  Patient had PEG tube and has been getting 1553m water in addition to feeding. Feeling weak. States has L lower anterior chest pain. Per daughter has known Rib fracture on L. DM, GERD, leaky heart valve, HTN EXAM: CHEST - 2 VIEW COMPARISON:  CT of the abdomen and pelvis on 05/28/2018 FINDINGS: Heart size is mildly enlarged. There is atherosclerotic calcification of the thoracic aorta. The lungs are free of focal consolidations. There is a trace RIGHT pleural effusion. No pulmonary edema. Peg tube in the LEFT UPPER QUADRANT appears unremarkable. IMPRESSION: Mild cardiomegaly without pulmonary edema. Aortic atherosclerosis.  (ICD10-I70.0) Electronically Signed   By: ENolon NationsM.D.   On: 06/23/2018 13:24    Pending Labs Unresulted Labs (From admission, onward)    Start     Ordered   06/23/18 1434  Creatinine, urine, random  Once,   STAT     06/23/18 1433   06/23/18 1434  Chloride, urine, random  Once,   STAT     06/23/18 1433   06/23/18 1434  Sodium  Now then every 4 hours,   STAT     06/23/18 1433   06/23/18 1433  Sodium, urine, random  STAT,   STAT     06/23/18 1433   Signed and Held  CBC  (enoxaparin (LOVENOX)    CrCl < 30 ml/min)  Once,   R  Comments:  Baseline for enoxaparin therapy IF NOT  ALREADY DRAWN.  Notify MD if PLT < 100 K.    Signed and Held   Signed and Held  Creatinine, serum  (enoxaparin (LOVENOX)    CrCl < 30 ml/min)  Once,   R    Comments:  Baseline for enoxaparin therapy IF NOT ALREADY DRAWN.    Signed and Held   Signed and Held  Creatinine, serum  (enoxaparin (LOVENOX)    CrCl < 30 ml/min)  Weekly,   R    Comments:  while on enoxaparin therapy.    Signed and Held   Signed and Held  Comprehensive metabolic panel  Tomorrow morning,   R     Signed and Held   Signed and Held  CBC  Tomorrow morning,   R     Signed and Held          Vitals/Pain Today's Vitals   06/23/18 1500 06/23/18 1547 06/23/18 1600 06/23/18 1630  BP: (!) 123/38  131/75 (!) 136/39  Pulse: (!) 56  (!) 54 (!) 56  Temp:      TempSrc:      SpO2: 100%  99% 99%  Weight:      Height:      PainSc:  7       Isolation Precautions No active isolations  Medications Medications  0.9 %  sodium chloride infusion ( Intravenous New Bag/Given 06/23/18 1511)  insulin aspart (novoLOG) injection 0-9 Units (has no administration in time range)  insulin aspart (novoLOG) injection 0-5 Units (has no administration in time range)  traMADol (ULTRAM) 5 mg/mL oral suspension SUSP 50 mg (has no administration in time range)  morphine 2 MG/ML injection 2 mg (has no administration in time range)  pantoprazole sodium (PROTONIX) 40 mg/20 mL oral suspension 40 mg (has no administration in time range)  morphine 2 MG/ML injection 2 mg (2 mg Intravenous Given 06/23/18 1514)    Mobility walks with person assist

## 2018-06-23 NOTE — ED Triage Notes (Signed)
First Nurse Note:  Arrives from [facility] GI clinic for electrolyte replacement.  Patient has history of Esophogeal stenosis and had a peg tube placed last week.  Recent labs:  NA:  120  BUN:  76  Patient is AAOx3.  Skin warm and dry. NAD

## 2018-06-23 NOTE — Progress Notes (Signed)
Family Meeting Note  Advance Directive:yes  Today a meeting took place with the Patient, daughter [name] at bedside    The following clinical team members were present during this meeting:MD  The following were discussed:Patient's diagnosis: Acute hyponatremia, dehydration, left-sided rib pain, recent PEG tube placement, comorbidities and treatment plan of care discussed in detail with the patient and her daughter at bedside.  They both verbalized understanding of the plan  Protein-calorie malnutrition, severe 06/12/2018   . Esophageal stricture 06/11/2018  . Weight loss 05/29/2018  . Dysphagia 05/29/2018  . Esophageal stenosis 05/29/2018  . Hyperlipemia 04/04/2018  . Itching 01/08/2018  . Medication monitoring encounter 01/08/2018  . Paronychia and onychomycosis great toe, right  01/08/2018  . 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 Hypertension 04/30/2017  . Mesenteric ischemia ([place]) 03/01/2017  . Type 2 diabetes mellitus with complication ([place]) 92/44/6286  . Iron deficiency anemia 02/28/2017  . PAD (peripheral artery disease) ([place])        patient's progosis: Unable to determine and Goals for treatment: Full Code  Additional follow-up to be provided: Hospitalist, dietitian   Time spent during discussion:17 min  Nicholes Mango, MD

## 2018-06-23 NOTE — ED Notes (Signed)
Pt family report that she had a PEG tube placed and they were feeding 8oz every 6 hours but she began having problems so they changed to 4oz "twice as often" - she is now c/o left intercostal space pain - she was seen by her PCP and they advised that her labs were "abnormal" and that she needed to come to the ED for eval and treatment

## 2018-06-23 NOTE — Progress Notes (Signed)
Nutrition  Received page from Stephens November, NP with Gastroenterology at [facility] this am requesting that patient been seen sooner for nutrition appointment than October 10th.  NP reports that patient will be admitted to the hospital today for hyponatremia.    Intervention: Alerted inpatient RDs to patient being admitted. Called patient's daughter [name] and left message on mobile number with nutrition appointment of October 3rd at 1:45pm. Also left message on patient's home voicemail with nutrition appointment date and time.  Contact information provided to patient.   Allyah Heather B. Zenia Resides, [place], [place] Registered Dietitian [phone] (pager)

## 2018-06-23 NOTE — ED Provider Notes (Signed)
[facility] Emergency Department Provider Note   ____________________________________________    I have reviewed the triage vital signs and the nursing notes.   HISTORY  Chief Complaint Abnormal Lab     HPI [name] Day is a 84 y.o. female with a history of diabetes, PEG tube secondary to esophageal stricture who was sent in today for hyponatremia.  Patient reports she has had her labs checked several times at her GI doctor's office and her sodium has been low.  Called today and told to come to the emergency department because no improvement with attempted feeding adjustments.  Patient feels fatigued but otherwise has no complaints   Past Medical History:  Diagnosis Date  . Anemia   . Aortic valve sclerosis   . Arrhythmia   . Arthritis    osteoarthritis  . Basal cell carcinoma of skin   . Brain tumor ([place])   . Brain tumor ([place])   . Cervical spine disease   . Diabetes mellitus without complication ([place])   . Dysrhythmia    sinus arrhythmia  . Esophageal ulcer without bleeding   . GERD (gastroesophageal reflux disease)   . History of kidney stones   . Hyperlipemia   . Hypertension   . Kidney stones   . Leaky heart valve   . Meningioma ([place])   . 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 Pulmonary hypertension [name])     Patient Active Problem List   Diagnosis Date Noted  . Hyponatremia 06/23/2018  . Protein-calorie malnutrition, severe 06/12/2018  . Esophageal stricture 06/11/2018  . Weight loss 05/29/2018  . Dysphagia 05/29/2018  . Esophageal stenosis 05/29/2018  . Hyperlipemia 04/04/2018  . Itching 01/08/2018  . Medication monitoring encounter 01/08/2018  . Paronychia and onychomycosis great toe, right  01/08/2018  . Esophagitis, CMV ([place]) 01/01/2018  . Hypertension 04/30/2017  . Mesenteric ischemia ([place]) 03/01/2017  . Type 2 diabetes mellitus with complication ([place]) 93/71/6967  . Iron deficiency anemia 02/28/2017  . PAD  (peripheral artery disease) ([place]) 02/05/2017    Past Surgical History:  Procedure Laterality Date  . ABDOMINAL HYSTERECTOMY    . APPENDECTOMY    . ARTHROGRAM KNEE Left   . COLONOSCOPY WITH PROPOFOL N/A 06/20/2017   Procedure: COLONOSCOPY WITH PROPOFOL;  Surgeon: Lollie Sails, MD;  Location: [facility] ENDOSCOPY;  Service: Endoscopy;  Laterality: N/A;  . ESOPHAGOGASTRODUODENOSCOPY (EGD) WITH PROPOFOL N/A 03/14/2015   Procedure: ESOPHAGOGASTRODUODENOSCOPY (EGD) WITH PROPOFOL;  Surgeon: Josefine Class, MD;  Location: [facility] ENDOSCOPY;  Service: Endoscopy;  Laterality: N/A;  . ESOPHAGOGASTRODUODENOSCOPY (EGD) WITH PROPOFOL N/A 03/28/2017   Procedure: ESOPHAGOGASTRODUODENOSCOPY (EGD) WITH PROPOFOL;  Surgeon: Lollie Sails, MD;  Location: [facility] ENDOSCOPY;  Service: Endoscopy;  Laterality: N/A;  . ESOPHAGOGASTRODUODENOSCOPY (EGD) WITH PROPOFOL N/A 06/20/2017   Procedure: ESOPHAGOGASTRODUODENOSCOPY (EGD) WITH PROPOFOL;  Surgeon: Lollie Sails, MD;  Location: [facility] ENDOSCOPY;  Service: Endoscopy;  Laterality: N/A;  . ESOPHAGOGASTRODUODENOSCOPY (EGD) WITH PROPOFOL N/A 10/21/2017   Procedure: ESOPHAGOGASTRODUODENOSCOPY (EGD) WITH PROPOFOL;  Surgeon: Lollie Sails, MD;  Location: [facility] ENDOSCOPY;  Service: Endoscopy;  Laterality: N/A;  . ESOPHAGOGASTRODUODENOSCOPY (EGD) WITH PROPOFOL N/A 04/15/2018   Procedure: ESOPHAGOGASTRODUODENOSCOPY (EGD) WITH PROPOFOL;  Surgeon: Lollie Sails, MD;  Location: [facility] ENDOSCOPY;  Service: Endoscopy;  Laterality: N/A;  . EYE SURGERY    . HYSTERECTOMY ABDOMINAL WITH SALPINGECTOMY    . IR GASTROSTOMY TUBE MOD SED  06/11/2018  . PERIPHERAL VASCULAR CATHETERIZATION N/A 01/23/2016   Procedure: Visceral Venography;  Surgeon: Corene Cornea  Bunnie Domino, MD;  Location: [place] CV LAB;  Service: Cardiovascular;  Laterality: N/A;  . PERIPHERAL VASCULAR CATHETERIZATION  01/23/2016   Procedure: Peripheral Vascular Intervention;  Surgeon: Algernon Huxley, MD;  Location: [place]  CV LAB;  Service: Cardiovascular;;  . UPPER ESOPHAGEAL ENDOSCOPIC ULTRASOUND (EUS) N/A 12/05/2017   Procedure: UPPER ESOPHAGEAL ENDOSCOPIC ULTRASOUND (EUS);  Surgeon: Jola Schmidt, MD;  Location: [facility] ENDOSCOPY;  Service: Endoscopy;  Laterality: N/A;  . VISCERAL ANGIOGRAPHY N/A 03/18/2017   Procedure: Visceral Angiography;  Surgeon: Algernon Huxley, MD;  Location: [place] CV LAB;  Service: Cardiovascular;  Laterality: N/A;  . VISCERAL ARTERY INTERVENTION N/A 03/18/2017   Procedure: Visceral Artery Intervention;  Surgeon: Algernon Huxley, MD;  Location: [place] CV LAB;  Service: Cardiovascular;  Laterality: N/A;    Prior to Admission medications   Medication Sig Start Date End Date Taking? Authorizing Provider  acetaminophen (TYLENOL) 160 MG/5ML solution Place 20.3 mLs (650 mg total) into feeding tube every 6 (six) hours as needed for mild pain. 06/13/18   Demetrios Loll, MD  allopurinol (ZYLOPRIM) 20 mg/mL SUSP Place 5 mLs (100 mg total) into feeding tube at bedtime. 06/13/18   Demetrios Loll, MD  Amino Acids-Protein Hydrolys (FEEDING SUPPLEMENT, PRO-STAT SUGAR FREE 64,) LIQD Place 30 mLs into feeding tube daily. 06/13/18   Demetrios Loll, MD  clopidogrel (PLAVIX) 75 MG tablet Place 1 tablet (75 mg total) into feeding tube daily. 06/13/18   Demetrios Loll, MD  furosemide (LASIX) 10 MG/ML solution Place 4 mLs (40 mg total) into feeding tube 2 (two) times daily. 06/13/18 07/13/18  Demetrios Loll, MD  glipiZIDE (GLUCOTROL) 5 MG tablet Place 1 tablet (5 mg total) into feeding tube daily before breakfast. 06/13/18   Demetrios Loll, MD  hydrALAZINE (APRESOLINE) 50 MG tablet Place 1 tablet (50 mg total) into feeding tube 2 (two) times daily. 06/13/18   Demetrios Loll, MD  hydrochlorothiazide (HYDRODIURIL) 25 MG tablet Place 1 tablet (25 mg total) into feeding tube daily. 06/13/18   Demetrios Loll, MD  Hypromellose [facility] MILD) 0.2 % SOLN Place 1 drop into both eyes 3 (three) times daily.    [provider]  lisinopril  (PRINIVIL,ZESTRIL) 40 MG tablet Place 1 tablet (40 mg total) into feeding tube daily. 06/13/18   Demetrios Loll, MD  metoprolol tartrate 37.5 MG TABS Place 37.5 mg into feeding tube 2 (two) times daily. 06/13/18 07/13/18  Demetrios Loll, MD  Multiple Vitamin (MULTIVITAMIN) LIQD Place 15 mLs into feeding tube daily. 06/13/18   Demetrios Loll, MD  Nutritional Supplements (FEEDING SUPPLEMENT, OSMOLITE 1.5 CAL,) LIQD Place 237 mLs into feeding tube every 6 (six) hours. 06/13/18   Demetrios Loll, MD  pantoprazole sodium (PROTONIX) 40 mg/20 mL PACK Place 20 mLs (40 mg total) into feeding tube 2 (two) times daily. 06/13/18   Demetrios Loll, MD  rOPINIRole (REQUIP) 2 MG tablet Place 1 tablet (2 mg total) into feeding tube 2 (two) times daily. 06/13/18   Demetrios Loll, MD  sertraline (ZOLOFT) 20 MG/ML concentrated solution Place 1.3 mLs (26 mg total) into feeding tube daily. 06/13/18   Demetrios Loll, MD  sucralfate (CARAFATE) 1 GM/10ML suspension Place 10 mLs (1 g total) into feeding tube 3 (three) times daily as needed (for difficulty swallowing). 06/13/18   Demetrios Loll, MD  vitamin C (VITAMIN C) 250 MG tablet Place 1 tablet (250 mg total) into feeding tube 2 (two) times daily. 06/13/18   Demetrios Loll, MD  Water For Irrigation, Sterile (FREE WATER) [place]  Place 30 mLs into feeding tube every 4 (four) hours. 06/13/18   Demetrios Loll, MD     Allergies Gabapentin; Lipitor [atorvastatin]; Mevacor [lovastatin]; Milk-related compounds; Septra [sulfamethoxazole-trimethoprim]; Statins; Sulfur; and Zocor [simvastatin]  Family History  Problem Relation Age of Onset  . Stroke Mother   . Hypertension Mother   . Cancer Father   . Heart attack Sister   . Diabetes Sister   . Heart attack Brother     Social History Social History   Tobacco Use  . Smoking status: Never Smoker  . Smokeless tobacco: Never Used  Substance Use Topics  . Alcohol use: Never    Frequency: Never  . Drug use: Never    Review of Systems  Constitutional: Fatigue Eyes: No  visual changes.  ENT: No sore throat. Cardiovascular: Denies chest pain. Respiratory: Denies shortness of breath. Gastrointestinal: Some pain above the PEG tube site Genitourinary: Negative for dysuria. Musculoskeletal: Negative for back pain. Skin: Negative for rash. Neurological: Negative for headaches    ____________________________________________   PHYSICAL EXAM:  VITAL SIGNS: ED Triage Vitals  Enc Vitals Group     BP 06/23/18 1233 (!) 116/27     Pulse Rate 06/23/18 1233 (!) 55     Resp --      Temp 06/23/18 1233 97.8 F (36.6 C)     Temp Source 06/23/18 1233 Oral     SpO2 06/23/18 1233 100 %     Weight 06/23/18 1234 51.3 kg (113 lb)     Height 06/23/18 1234 1.613 m (5' 3.5")     Head Circumference --      Peak Flow --      Pain Score 06/23/18 1414 5     Pain Loc --      Pain Edu? --      Excl. in [place]? --     Constitutional: Alert and oriented. No acute distress.  Eyes: Conjunctivae are normal.   Nose: No congestion/rhinnorhea. Mouth/Throat: Mucous membranes are moist.    Cardiovascular: Normal rate, regular rhythm. Grossly normal heart sounds.  Good peripheral circulation. Respiratory: Normal respiratory effort.  No retractions. Lungs CTAB. Gastrointestinal: Soft and nontender. No distention.  PEG tube site clean dry intact, no erythema.  Patient has lidocaine patch just superior to her PEG tube where apparently she has a broken rib that gives her pain per daughter  Musculoskeletal:   Warm and well perfused Neurologic:  Normal speech and language. No gross focal neurologic deficits are appreciated.  Skin:  Skin is warm, dry and intact. No rash noted. Psychiatric: Mood and affect are normal. Speech and behavior are normal.  ____________________________________________   LABS (all labs ordered are listed, but only abnormal results are displayed)  Labs Reviewed  CBC - Abnormal; Notable for the following components:      Result Value   RBC 3.46 (*)     Hemoglobin 10.4 (*)    HCT 29.7 (*)    All other components within normal limits  COMPREHENSIVE METABOLIC PANEL - Abnormal; Notable for the following components:   Sodium 121 (*)    Chloride 78 (*)    CO2 34 (*)    Glucose, Bld 122 (*)    BUN 80 (*)    Creatinine, Ser 1.02 (*)    Calcium 8.4 (*)    Total Protein 6.3 (*)    Albumin 3.2 (*)    GFR calc non Af Amer 48 (*)    GFR calc Af Amer 56 (*)  All other components within normal limits  TROPONIN I  SODIUM, URINE, RANDOM  CREATININE, URINE, RANDOM  CHLORIDE, URINE, RANDOM  SODIUM  SODIUM  SODIUM   ____________________________________________  EKG ED ECG REPORT I, Lavonia Drafts, the attending physician, personally viewed and interpreted this ECG.  Date: 06/23/2018  Rhythm: Bradycardia QRS Axis: normal Intervals: normal ST/T Wave abnormalities: normal Narrative Interpretation: no evidence of acute ischemia   ____________________________________________  RADIOLOGY  Chest x-ray demonstrates mild cardia megaly ____________________________________________   PROCEDURES  Procedure(s) performed: No  Procedures   Critical Care performed: No ____________________________________________   INITIAL IMPRESSION / ASSESSMENT AND PLAN / ED COURSE  Pertinent labs & imaging results that were available during my care of the patient were reviewed by me and considered in my medical decision making (see chart for details).  Patient presents with weakness and reports of hyponatremia, sodium checked and found to be 121.  No change in mental status.  Will discuss with the hospitalist for admission    ____________________________________________   FINAL CLINICAL IMPRESSION(S) / ED DIAGNOSES  Final diagnoses:  Acute hyponatremia        Note:  This document was prepared using Dragon voice recognition software and may include unintentional dictation errors.    Lavonia Drafts, MD 06/23/18 1451

## 2018-06-23 NOTE — H&P (Signed)
[facility] [place] NAME: [name]    MR#:  [id number]  DATE OF BIRTH:  [birth year]  DATE OF ADMISSION:  06/23/2018  PRIMARY CARE PHYSICIAN: [name], MD   REQUESTING/REFERRING PHYSICIAN: [place]  CHIEF COMPLAINT:   Abnormal labs HISTORY OF PRESENT ILLNESS:  [name]  is a 84 y.o. female with a known history of diabetes mellitus, esophageal stricture recent history of PEG tube placement is presenting to the ED with a chief complaint of abnormal lab.  Patient's PEG tube bolus placed by Dr. Gustavo Lah and following the patient was started on Osmolite as patient was complaining of diarrhea her free water flushes were cut down to 1500 mL/[name].  Dr. Gustavo Lah has been following serial labs and sodium was found to be at 121 patient is sent over to the emergency department and hospitalist team is called Patient is reporting left rib pain but chest x-ray with no acute fractures.  Daughter at bedside PAST MEDICAL HISTORY:   Past Medical History:  Diagnosis Date  . Anemia   . Aortic valve sclerosis   . Arrhythmia   . Arthritis    osteoarthritis  . Basal cell carcinoma of skin   . Brain tumor ([place])   . Brain tumor ([place])   . Cervical spine disease   . Diabetes mellitus without complication ([place])   . Dysrhythmia    sinus arrhythmia  . Esophageal ulcer without bleeding   . GERD (gastroesophageal reflux disease)   . History of kidney stones   . Hyperlipemia   . Hypertension   . Kidney stones   . Leaky heart valve   . Meningioma ([place])   . 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 Pulmonary hypertension ([place])     PAST SURGICAL HISTOIRY:   Past Surgical History:  Procedure Laterality Date  . ABDOMINAL HYSTERECTOMY    . APPENDECTOMY    . ARTHROGRAM KNEE Left   . COLONOSCOPY WITH PROPOFOL N/A 06/20/2017   Procedure: COLONOSCOPY WITH PROPOFOL;  Surgeon: Lollie Sails, MD;  Location: [facility] ENDOSCOPY;  Service:  Endoscopy;  Laterality: N/A;  . ESOPHAGOGASTRODUODENOSCOPY (EGD) WITH PROPOFOL N/A 03/14/2015   Procedure: ESOPHAGOGASTRODUODENOSCOPY (EGD) WITH PROPOFOL;  Surgeon: Josefine Class, MD;  Location: [facility] ENDOSCOPY;  Service: Endoscopy;  Laterality: N/A;  . ESOPHAGOGASTRODUODENOSCOPY (EGD) WITH PROPOFOL N/A 03/28/2017   Procedure: ESOPHAGOGASTRODUODENOSCOPY (EGD) WITH PROPOFOL;  Surgeon: Lollie Sails, MD;  Location: [facility] ENDOSCOPY;  Service: Endoscopy;  Laterality: N/A;  . ESOPHAGOGASTRODUODENOSCOPY (EGD) WITH PROPOFOL N/A 06/20/2017   Procedure: ESOPHAGOGASTRODUODENOSCOPY (EGD) WITH PROPOFOL;  Surgeon: Lollie Sails, MD;  Location: [facility] ENDOSCOPY;  Service: Endoscopy;  Laterality: N/A;  . ESOPHAGOGASTRODUODENOSCOPY (EGD) WITH PROPOFOL N/A 10/21/2017   Procedure: ESOPHAGOGASTRODUODENOSCOPY (EGD) WITH PROPOFOL;  Surgeon: Lollie Sails, MD;  Location: [facility] ENDOSCOPY;  Service: Endoscopy;  Laterality: N/A;  . ESOPHAGOGASTRODUODENOSCOPY (EGD) WITH PROPOFOL N/A 04/15/2018   Procedure: ESOPHAGOGASTRODUODENOSCOPY (EGD) WITH PROPOFOL;  Surgeon: Lollie Sails, MD;  Location: [facility] ENDOSCOPY;  Service: Endoscopy;  Laterality: N/A;  . EYE SURGERY    . HYSTERECTOMY ABDOMINAL WITH SALPINGECTOMY    . IR GASTROSTOMY TUBE MOD SED  06/11/2018  . PERIPHERAL VASCULAR CATHETERIZATION N/A 01/23/2016   Procedure: Visceral Venography;  Surgeon: Algernon Huxley, MD;  Location: [place] CV LAB;  Service: Cardiovascular;  Laterality: N/A;  . PERIPHERAL VASCULAR CATHETERIZATION  01/23/2016   Procedure: Peripheral Vascular Intervention;  Surgeon: Algernon Huxley, MD;  Location: [place] CV LAB;  Service: Cardiovascular;;  . UPPER ESOPHAGEAL ENDOSCOPIC ULTRASOUND (EUS) N/A 12/05/2017   Procedure: UPPER ESOPHAGEAL ENDOSCOPIC ULTRASOUND (EUS);  Surgeon: Jola Schmidt, MD;  Location: [facility] ENDOSCOPY;  Service: Endoscopy;  Laterality: N/A;  . VISCERAL ANGIOGRAPHY N/A 03/18/2017   Procedure: Visceral Angiography;   Surgeon: Algernon Huxley, MD;  Location: [place] CV LAB;  Service: Cardiovascular;  Laterality: N/A;  . VISCERAL ARTERY INTERVENTION N/A 03/18/2017   Procedure: Visceral Artery Intervention;  Surgeon: Algernon Huxley, MD;  Location: [place] CV LAB;  Service: Cardiovascular;  Laterality: N/A;    SOCIAL HISTORY:   Social History   Tobacco Use  . Smoking status: Never Smoker  . Smokeless tobacco: Never Used  Substance Use Topics  . Alcohol use: Never    Frequency: Never    FAMILY HISTORY:   Family History  Problem Relation Age of Onset  . Stroke Mother   . Hypertension Mother   . Cancer Father   . Heart attack Sister   . Diabetes Sister   . Heart attack Brother     DRUG ALLERGIES:   Allergies  Allergen Reactions  . Gabapentin Swelling  . Lipitor [Atorvastatin] Other (See Comments)    Muscle aches  . Mevacor [Lovastatin] Other (See Comments)    Muscle aches  . Milk-Related Compounds     Large quantities cause headaches   . Septra [Sulfamethoxazole-Trimethoprim]   . Statins Other (See Comments)    Muscle pain  . Sulfur Diarrhea and Nausea And Vomiting  . Zocor [Simvastatin] Other (See Comments)    Muscle aches    REVIEW OF SYSTEMS:  CONSTITUTIONAL: No fever, fatigue or weakness.  EYES: No blurred or double vision.  EARS, NOSE, AND THROAT: No tinnitus or ear pain.  RESPIRATORY: No cough, shortness of breath, wheezing or hemoptysis.  CARDIOVASCULAR: No chest pain, orthopnea, edema.  Reporting left-sided rib pain GASTROINTESTINAL: No nausea, vomiting, reporting diarrhea with Osmolite tube feeds, denies abdominal pain.  GENITOURINARY: No dysuria, hematuria.  ENDOCRINE: No polyuria, nocturia,  HEMATOLOGY: No anemia, easy bruising or bleeding SKIN: No rash or lesion. MUSCULOSKELETAL: No joint pain or arthritis.   NEUROLOGIC: No tingling, numbness, weakness.  PSYCHIATRY: No anxiety or depression.   MEDICATIONS AT HOME:   Prior to Admission medications    Medication Sig Start Date End Date Taking? Authorizing Provider  acetaminophen (TYLENOL) 160 MG/5ML solution Place 20.3 mLs (650 mg total) into feeding tube every 6 (six) hours as needed for mild pain. 06/13/18  Yes Demetrios Loll, MD  allopurinol (ZYLOPRIM) 20 mg/mL SUSP Place 5 mLs (100 mg total) into feeding tube at bedtime. 06/13/18  Yes Demetrios Loll, MD  Amino Acids-Protein Hydrolys (FEEDING SUPPLEMENT, PRO-STAT SUGAR FREE 64,) LIQD Place 30 mLs into feeding tube daily. 06/13/18  Yes Demetrios Loll, MD  clopidogrel (PLAVIX) 75 MG tablet Place 1 tablet (75 mg total) into feeding tube daily. 06/13/18  Yes Demetrios Loll, MD  furosemide (LASIX) 10 MG/ML solution Place 4 mLs (40 mg total) into feeding tube 2 (two) times daily. 06/13/18 07/13/18 Yes Demetrios Loll, MD  glipiZIDE (GLUCOTROL) 5 MG tablet Place 1 tablet (5 mg total) into feeding tube daily before breakfast. 06/13/18  Yes Demetrios Loll, MD  hydrALAZINE (APRESOLINE) 50 MG tablet Place 1 tablet (50 mg total) into feeding tube 2 (two) times daily. 06/13/18  Yes Demetrios Loll, MD  hydrochlorothiazide (HYDRODIURIL) 25 MG tablet Place 1 tablet (25 mg total) into feeding tube daily. 06/13/18  Yes Demetrios Loll, MD  Hypromellose [facility] MILD) 0.2 %  SOLN Place 1 drop into both eyes 3 (three) times daily.   Yes [provider]  lansoprazole (PREVACID SOLUTAB) 30 MG disintegrating tablet Take 30 mg by mouth daily.   Yes [provider]  lidocaine (LIDODERM) 5 % Place 1 patch onto the skin daily. Remove & Discard patch within 12 hours or as directed by MD   Yes [provider]  lisinopril (PRINIVIL,ZESTRIL) 40 MG tablet Place 1 tablet (40 mg total) into feeding tube daily. 06/13/18  Yes Demetrios Loll, MD  meclizine (ANTIVERT) 25 MG tablet Take 25 mg by mouth 3 (three) times daily as needed for dizziness or nausea.   Yes [provider]  metoprolol tartrate 37.5 MG TABS Place 37.5 mg into feeding tube 2 (two) times daily. 06/13/18 07/13/18 Yes Demetrios Loll, MD  Multiple Vitamin (MULTIVITAMIN) LIQD Place 15 mLs into feeding tube daily. 06/13/18  Yes Demetrios Loll, MD  Nutritional Supplements (FEEDING SUPPLEMENT, OSMOLITE 1.5 CAL,) LIQD Place 237 mLs into feeding tube every 6 (six) hours. 06/13/18  Yes Demetrios Loll, MD  rOPINIRole (REQUIP) 2 MG tablet Place 1 tablet (2 mg total) into feeding tube 2 (two) times daily. 06/13/18  Yes Demetrios Loll, MD  vitamin C (VITAMIN C) 250 MG tablet Place 1 tablet (250 mg total) into feeding tube 2 (two) times daily. 06/13/18  Yes Demetrios Loll, MD  Water For Irrigation, Sterile (FREE WATER) SOLN Place 30 mLs into feeding tube every 4 (four) hours. 06/13/18  Yes Demetrios Loll, MD  pantoprazole sodium (PROTONIX) 40 mg/20 mL PACK Place 20 mLs (40 mg total) into feeding tube 2 (two) times daily. Patient not taking: Reported on 06/23/2018 06/13/18   Demetrios Loll, MD  sertraline (ZOLOFT) 20 MG/ML concentrated solution Place 1.3 mLs (26 mg total) into feeding tube daily. Patient not taking: Reported on 06/23/2018 06/13/18   Demetrios Loll, MD  sucralfate (CARAFATE) 1 GM/10ML suspension Place 10 mLs (1 g total) into feeding tube 3 (three) times daily as needed (for difficulty swallowing). Patient not taking: Reported on 06/23/2018 06/13/18   Demetrios Loll, MD      VITAL SIGNS:  Blood pressure (!) 116/27, pulse (!) 55, temperature 97.8 F (36.6 C), temperature source Oral, height 5' 3.5" (1.613 m), weight 51.3 kg, SpO2 100 %.  PHYSICAL EXAMINATION:  GENERAL:  84 y.o.-year-old patient lying in the bed with no acute distress.  EYES: Pupils equal, round, reactive to light and accommodation. No scleral icterus. Extraocular muscles intact.  HEENT: Head atraumatic, normocephalic. Oropharynx and nasopharynx clear.  Dry mucous membranes NECK:  Supple, no jugular venous distention. No thyroid enlargement, no tenderness.  LUNGS: Normal breath sounds bilaterally, no wheezing, rales,rhonchi or crepitation. No use of accessory muscles of respiration.   CARDIOVASCULAR: S1, S2 normal. No murmurs, rubs, or gallops.  ABDOMEN: Soft, nontender, nondistended. Bowel sounds present.  PEG site is intact with no erythema EXTREMITIES: No pedal edema, cyanosis, or clubbing.  NEUROLOGIC: Cranial nerves II through XII are intact. Sensation intact. Gait not checked.  PSYCHIATRIC: The patient is alert and oriented x 3.  SKIN: No obvious rash, lesion, or ulcer.   LABORATORY PANEL:   CBC Recent Labs  Lab 06/23/18 1243  WBC 10.6  HGB 10.4*  HCT 29.7*  PLT 400   ------------------------------------------------------------------------------------------------------------------  Chemistries  Recent Labs  Lab 06/23/18 1243  NA 121*  K 4.3  CL 78*  CO2 34*  GLUCOSE 122*  BUN 80*  CREATININE 1.02*  CALCIUM 8.4*  AST 20  ALT 16  ALKPHOS 61  BILITOT 0.5   ------------------------------------------------------------------------------------------------------------------  Cardiac Enzymes Recent Labs  Lab 06/23/18 1243  TROPONINI <0.03   ------------------------------------------------------------------------------------------------------------------  RADIOLOGY:  Dg Chest 2 View  Result Date: 06/23/2018 CLINICAL DATA:  Patient had PEG tube and has been getting 1563ml water in addition to feeding. Feeling weak. States has L lower anterior chest pain. Per daughter has known Rib fracture on L. DM, GERD, leaky heart valve, HTN EXAM: CHEST - 2 VIEW COMPARISON:  CT of the abdomen and pelvis on 05/28/2018 FINDINGS: Heart size is mildly enlarged. There is atherosclerotic calcification of the thoracic aorta. The lungs are free of focal consolidations. There is a trace RIGHT pleural effusion. No pulmonary edema. Peg tube in the LEFT UPPER QUADRANT appears unremarkable. IMPRESSION: Mild cardiomegaly without pulmonary edema. Aortic atherosclerosis.  (ICD10-I70.0) Electronically Signed   By: Nolon Nations M.D.   On: 06/23/2018 13:24    EKG:   Orders  placed or performed during the hospital encounter of 06/23/18  . ED EKG within 10 minutes  . ED EKG within 10 minutes    IMPRESSION AND PLAN:     #Acute hyponatremia from poor intake and dehydration Admit to MedSurg unit Patient is mentating fine Hydrate with IV fluids and monitor serial sodiums Check renal sodium, random creatinine and lites Dietitian consult placed If no improvement in serum sodium levels will consider nephrology consult   #Dehydration Gentle hydration with IV fluids and water flushes  #Left-sided rib pain Pain management as needed Chest x-ray with no acute fractures   #History of esophageal stricture recent PEG tube placement On Osmolite with 1500 cc of water flushes. Dietary consult placed  #Severe protein energy malnutrition Consult placed to dietitian to do calorie count and provide dietary supplements  #Diabetes mellitus Glipizide will be continued and sliding scale insulin will be provided   #Hypertension Holding Lasix and hydrochlorothiazide at this time in view of renal insufficiency dehydration    All the records are reviewed and case discussed with ED provider. Management plans discussed with the patient, family and they are in agreement.  CODE STATUS: fc , daughter   TOTAL TIME TAKING CARE OF THIS PATIENT: 43  minutes.   Note: This dictation was prepared with Dragon dictation along with smaller phrase technology. Any transcriptional errors that result from this process are unintentional.  Nicholes Mango M.D on 06/23/2018 at 3:20 PM  Between 7am to 6pm - Pager - [phone]  After 6pm go to [email or web address] - password [email or web address] [place]  Office  [phone]  CC: Primary care physician; [name], MD

## 2018-06-23 NOTE — Progress Notes (Signed)
Anticoagulation monitoring(Lovenox):  84 yo female ordered Lovenox 30 mg Q24h  Filed Weights   06/23/18 1234  Weight: 113 lb (51.3 kg)   BMI    Lab Results  Component Value Date   CREATININE 1.02 (H) 06/23/2018   CREATININE 0.68 06/12/2018   CREATININE 0.61 06/11/2018   Estimated Creatinine Clearance: 32.1 mL/min (A) (by C-G formula based on SCr of 1.02 mg/dL (H)). Hemoglobin & Hematocrit     Component Value Date/Time   HGB 10.4 (L) 06/23/2018 1243   HGB 12.3 12/23/2014 1008   HCT 29.7 (L) 06/23/2018 1243     Per Protocol for Patient with estCrcl > 30 ml/min and BMI < 40, will transition to Lovenox 40 mg Q24h.

## 2018-06-24 LAB — CBC
HCT: 27.5 % — ABNORMAL LOW (ref 35.0–47.0)
Hemoglobin: 9.6 g/dL — ABNORMAL LOW (ref 12.0–16.0)
MCH: 30 pg (ref 26.0–34.0)
MCHC: 34.8 g/dL (ref 32.0–36.0)
MCV: 86.2 fL (ref 80.0–100.0)
Platelets: 358 10*3/uL (ref 150–440)
RBC: 3.19 MIL/uL — ABNORMAL LOW (ref 3.80–5.20)
RDW: 14.1 % (ref 11.5–14.5)
WBC: 9.9 10*3/uL (ref 3.6–11.0)

## 2018-06-24 LAB — COMPREHENSIVE METABOLIC PANEL
ALT: 15 U/L (ref 0–44)
AST: 18 U/L (ref 15–41)
Albumin: 2.6 g/dL — ABNORMAL LOW (ref 3.5–5.0)
Alkaline Phosphatase: 55 U/L (ref 38–126)
Anion gap: 8 (ref 5–15)
BUN: 72 mg/dL — ABNORMAL HIGH (ref 8–23)
CO2: 33 mmol/L — ABNORMAL HIGH (ref 22–32)
Calcium: 8.1 mg/dL — ABNORMAL LOW (ref 8.9–10.3)
Chloride: 86 mmol/L — ABNORMAL LOW (ref 98–111)
Creatinine, Ser: 1.09 mg/dL — ABNORMAL HIGH (ref 0.44–1.00)
GFR calc Af Amer: 52 mL/min — ABNORMAL LOW (ref >60)
GFR calc non Af Amer: 45 mL/min — ABNORMAL LOW (ref >60)
Glucose, Bld: 271 mg/dL — ABNORMAL HIGH (ref 70–99)
Potassium: 4.5 mmol/L (ref 3.5–5.1)
Sodium: 127 mmol/L — ABNORMAL LOW (ref 135–145)
Total Bilirubin: 0.4 mg/dL (ref 0.3–1.2)
Total Protein: 5.6 g/dL — ABNORMAL LOW (ref 6.5–8.1)

## 2018-06-24 LAB — GLUCOSE, CAPILLARY
Glucose-Capillary: 322 mg/dL — ABNORMAL HIGH (ref 70–99)
Glucose-Capillary: 58 mg/dL — ABNORMAL LOW (ref 70–99)
Glucose-Capillary: 131 mg/dL — ABNORMAL HIGH (ref 70–99)
Glucose-Capillary: 188 mg/dL — ABNORMAL HIGH (ref 70–99)
Glucose-Capillary: 153 mg/dL — ABNORMAL HIGH (ref 70–99)

## 2018-06-24 MED ORDER — LISINOPRIL 10 MG PO TABS
10.0000 mg | ORAL_TABLET | Freq: Every day | ORAL | Status: DC
  Administered 2018-06-25: 10 mg
  Filled 2018-06-24: qty 1

## 2018-06-24 MED ORDER — DEXTROSE 50 % IV SOLN
25.0000 mL | Freq: Once | INTRAVENOUS | Status: AC
  Administered 2018-06-24: 25 mL via INTRAVENOUS
  Filled 2018-06-24: qty 50

## 2018-06-24 MED ORDER — JEVITY 1.5 CAL/FIBER PO LIQD
120.0000 mL | Freq: Four times a day (QID) | ORAL | Status: DC
  Administered 2018-06-24 (×4): 120 mL
  Administered 2018-06-25: 355 mL

## 2018-06-24 MED ORDER — ENOXAPARIN SODIUM 30 MG/0.3ML ~~LOC~~ SOLN
30.0000 mg | SUBCUTANEOUS | Status: DC
  Administered 2018-06-24: 30 mg via SUBCUTANEOUS
  Filled 2018-06-24: qty 0.3

## 2018-06-24 NOTE — Progress Notes (Signed)
Inpatient Diabetes Program Recommendations  AACE/ADA: New Consensus Statement on Inpatient Glycemic Control (2019)  Target Ranges:  Prepandial:   less than 140 mg/dL      Peak postprandial:   less than 180 mg/dL (1-2 hours)      Critically ill patients:  140 - 180 mg/dL   Results for [name], [name]" (MRN [id number]) as of 06/24/2018 09:44  Ref. Range 06/23/2018 17:28 06/23/2018 21:31 06/24/2018 07:27  Glucose-Capillary Latest Ref Range: 70 - 99 mg/dL 99 216 (H) 322 (H)   Review of Glycemic Control  Diabetes history: DM2 Outpatient Diabetes medications: Glipizide 5 mg QAM Current orders for Inpatient glycemic control: Glipizide 5 mg QAM, Novolog 0-9 units TID with meals, Novolog 0-5 units QHS  Inpatient Diabetes Program Recommendations:  Oral DM medication: Please discontinue Glipizide while inpatient. Insulin - Basal: If Glipizide is discontinued, please consider ordering Lantus 5 units Q24H. Correction (SSI): Please consider changing frequency of CBGs and Novolog to Q4H.  Thanks, Barnie Alderman, RN, MSN, CDE Diabetes Coordinator Inpatient Diabetes Program [phone] (Team Pager from 8am to 5pm)

## 2018-06-24 NOTE — Clinical Social Work Note (Signed)
CSW consulted for possible short term rehab. PT has assessed and recommending home health. Please re-consult should any CSW needs arise. Shela Leff MSW,LcSW [phone]

## 2018-06-24 NOTE — Progress Notes (Signed)
Hypoglycemic Event  CBG: 58  Treatment: D50  Symptoms: none  Follow-up CBG: Time:15 min CBG Result:131  Possible Reasons for Event: NPO  Comments/MD notified:In person    [place]

## 2018-06-24 NOTE — Plan of Care (Addendum)
The patient has been stable. No falls. Tube feeds provided. Tolerating Jevity 1.79ml. No falls. NPO. Monitoring sodium levels. Telemetry monitored. Problem: Education: Goal: Knowledge of General Education information will improve Description Including pain rating scale, medication(s)/side effects and non-pharmacologic comfort measures Outcome: Progressing   Problem: Health Behavior/Discharge Planning: Goal: Ability to manage health-related needs will improve Outcome: Progressing   Problem: Clinical Measurements: Goal: Ability to maintain clinical measurements within normal limits will improve Outcome: Progressing Goal: Will remain free from infection Outcome: Progressing Goal: Diagnostic test results will improve Outcome: Progressing Goal: Respiratory complications will improve Outcome: Progressing Goal: Cardiovascular complication will be avoided Outcome: Progressing   Problem: Activity: Goal: Risk for activity intolerance will decrease Outcome: Progressing   Problem: Nutrition: Goal: Adequate nutrition will be maintained Outcome: Progressing   Problem: Coping: Goal: Level of anxiety will decrease Outcome: Progressing   Problem: Elimination: Goal: Will not experience complications related to bowel motility Outcome: Progressing Goal: Will not experience complications related to urinary retention Outcome: Progressing   Problem: Pain Managment: Goal: General experience of comfort will improve Outcome: Progressing   Problem: Safety: Goal: Ability to remain free from injury will improve Outcome: Progressing   Problem: Skin Integrity: Goal: Risk for impaired skin integrity will decrease Outcome: Progressing   Problem: Spiritual Needs Goal: Ability to function at adequate level Outcome: Progressing

## 2018-06-24 NOTE — Evaluation (Signed)
Occupational Therapy Evaluation Patient Details Name: [name] Day MRN: [id number] DOB: October 29, 1928 Today's Date: 06/24/2018    History of Present Illness Patient is an 84 year old female admitted for hyponatremia following abnormal reading during a serial lab check.  PMh includes skin CA, brain tumor, diabetes mellitus, esophageal stricture and recent history of PEG tube placement   Clinical Impression   Pt seen for OT evaluation this date. Prior to hospital admission, pt was I with all ADLs, performed most IADLs I'ly as well requiring only family assistance when she brought home several groceries. Pt was performing fxl mobility within home with [phone] and community with QC.  Pt lives alone, but her daughters (per pt and family report) have been alternating staying with since most recent hospitalization.  Currently pt demonstrates slight impairments in standing dynamic balance requiring supervision for fxl transfers and standing IADLs.  Time spent educating pt and dtr who is present on evaluation about role of OT and fall precautions that can be taken at home. Pt and dtr agreeable and confirm understanding. Pt does not appear to have any acute OT needs and does not require OT follow up upon d/c at this time.    Follow Up Recommendations  No OT follow up    Equipment Recommendations  None recommended by OT    Recommendations for Other Services       Precautions / Restrictions Precautions Precautions: Fall Restrictions Weight Bearing Restrictions: No      Mobility Bed Mobility             General bed mobility comments: pt up in chair when OT presents  Transfers Overall transfer level: Needs assistance Equipment used: Rolling walker (2 wheeled) Transfers: Sit to/from Stand Sit to Stand: Supervision         General transfer comment: pt requires one verbal cue for safe hand placement with sit<>stand, requires one verbal cue for safety r/t amb with IV.     Balance  Overall balance assessment: Modified Independent                                         ADL either performed or assessed with clinical judgement   ADL Overall ADL's : Modified independent;Needs assistance/impaired             Lower Body Bathing: Control and instrumentation engineer Transfer: Supervision/safety             General ADL Comments: performs standing ADLs sink side with QC for balance, alternates hands from countertop to retieve ADL items     Vision Baseline Vision/History: Wears glasses Wears Glasses: Reading only Patient Visual Report: No change from baseline       Perception     Praxis      Pertinent Vitals/Pain Pain Assessment: 0-10 Pain Score: 2  Pain Location: incision site from last hospitalization still gives her light intermittent dull ache Pain Intervention(s): Limited activity within patient's tolerance;Monitored during session     Hand Dominance     Extremity/Trunk Assessment Upper Extremity Assessment Upper Extremity Assessment: Overall WFL for tasks assessed   Lower Extremity Assessment Lower Extremity Assessment: Overall WFL for tasks assessed   Cervical / Trunk Assessment Cervical / Trunk Assessment: Kyphotic   Communication Communication Communication: No difficulties   Cognition Arousal/Alertness: Awake/alert Behavior During Therapy: WFL for tasks assessed/performed Overall  Cognitive Status: Within Functional Limits for tasks assessed                                 General Comments: appropriate, participates in aspects of education   General Comments       Exercises Other Exercises Other Exercises: Pt and dtr who is present are educated on role of OT. Educated on safety w/in home environment including using reacher for retrieval of dropped items d/t slightly decreased dynamic standing balance in setting of incisional pain since last hospitalization. Pt and dtr agreeable to safety  suggestions and verbalize understanding.  Other Exercises: Vitals (BP/HR) monitored in supine, sitting and standing and pt remained WNL.  No sx reported with prolonged standing though pt reported that she felt slightly dizzy when walking to restroom earlier in the day.   Shoulder Instructions      Home Living Family/patient expects to be discharged to:: Private residence Living Arrangements: Alone Available Help at Discharge: Family;Available 24 hours/day(pt's daughter present and states her and sister alternate staying with pt since last hospitalization) Type of Home: House Home Access: Ramped entrance     Home Layout: One level         Bathroom Toilet: Handicapped height     Home Equipment: Environmental consultant - 2 wheels;Walker - 4 wheels;Cane - single point;Cane - quad;Tub bench;Hospital bed;Wheelchair - manual   Additional Comments: states she has Adaptive equipment/devices in storage due to needs of late husband.      Prior Functioning/Environment Level of Independence: Independent with assistive device(s)        Comments: Has been using 4WW recently in the home to sit and rest during standing activities (ex: meal prep), uses QC in communuity and receiving HH PT following last hospitalization. States she still drives and daughters only help if pt needs to get several groceries.         OT Problem List: Decreased activity tolerance      OT Treatment/Interventions:      OT Goals(Current goals can be found in the care plan section) Acute Rehab OT Goals Patient Stated Goal: To get home OT Goal Formulation: All assessment and education complete, DC therapy  OT Frequency:     Barriers to D/C:            Co-evaluation              AM-PAC PT "6 Clicks" Daily Activity     Outcome Measure Help from another person eating meals?: None Help from another person taking care of personal grooming?: None Help from another person toileting, which includes using toliet, bedpan, or  urinal?: None Help from another person bathing (including washing, rinsing, drying)?: A Little Help from another person to put on and taking off regular upper body clothing?: None Help from another person to put on and taking off regular lower body clothing?: None 6 Click Score: 23   End of Session Equipment Utilized During Treatment: Gait belt(quad cane)  Activity Tolerance: Patient tolerated treatment well Patient left: in chair;with family/visitor present;with call bell/phone within reach  OT Visit Diagnosis: Unsteadiness on feet (R26.81)                Time: 6734-1937 OT Time Calculation (min): 26 min Charges:  OT General Charges $OT Visit: 1 Visit OT Evaluation $OT Eval Moderate Complexity: 1 Mod OT Treatments $Self Care/Home Management : 8-22 mins  Gerrianne Scale, MS, OTR/L ascom [phone]  or [phone] 06/24/18, 4:19 PM

## 2018-06-24 NOTE — Progress Notes (Signed)
[facility] [place] [facility] NAME: Tristan Bramble Day    MR#:  [id number]  DATE OF BIRTH:  Oct 12, 1928  SUBJECTIVE:  CHIEF COMPLAINT:   Chief Complaint  Patient presents with  . Abnormal Lab  Patient seen and evaluated today No nausea or vomiting No abdominal pain Has PEG tube and gets PEG tube feeding  REVIEW OF SYSTEMS:    ROS  CONSTITUTIONAL: No documented fever. No fatigue, weakness. No weight gain, no weight loss.  EYES: No blurry or double vision.  ENT: No tinnitus. No postnasal drip. No redness of the oropharynx.  RESPIRATORY: occasional cough, no wheeze, no hemoptysis. No dyspnea.  CARDIOVASCULAR: No chest pain. No orthopnea. No palpitations. No syncope.  GASTROINTESTINAL: No nausea, no vomiting or diarrhea. No abdominal pain. No melena or hematochezia.  GENITOURINARY: No dysuria or hematuria.  ENDOCRINE: No polyuria or nocturia. No heat or cold intolerance.  HEMATOLOGY: No anemia. No bruising. No bleeding.  INTEGUMENTARY: No rashes. No lesions.  MUSCULOSKELETAL: No arthritis. No swelling. No gout.  NEUROLOGIC: No numbness, tingling, or ataxia. No seizure-type activity.  PSYCHIATRIC: No anxiety. No insomnia. No ADD.   DRUG ALLERGIES:   Allergies  Allergen Reactions  . Gabapentin Swelling  . Lipitor [Atorvastatin] Other (See Comments)    Muscle aches  . Mevacor [Lovastatin] Other (See Comments)    Muscle aches  . Milk-Related Compounds     Large quantities cause headaches   . Septra [Sulfamethoxazole-Trimethoprim]   . Statins Other (See Comments)    Muscle pain  . Sulfur Diarrhea and Nausea And Vomiting  . Zocor [Simvastatin] Other (See Comments)    Muscle aches    VITALS:  Blood pressure (!) 135/33, pulse (!) 57, temperature 98.4 F (36.9 C), temperature source Oral, resp. rate 17, height 5' 3.5" (1.613 m), weight 51.3 kg, SpO2 96 %.  PHYSICAL EXAMINATION:   Physical Exam  GENERAL:  84 y.o.-year-old patient lying in  the bed with no acute distress.  EYES: Pupils equal, round, reactive to light and accommodation. No scleral icterus. Extraocular muscles intact.  HEENT: Head atraumatic, normocephalic. Oropharynx and nasopharynx clear.  NECK:  Supple, no jugular venous distention. No thyroid enlargement, no tenderness.  LUNGS: Normal breath sounds bilaterally, no wheezing, rales, rhonchi. No use of accessory muscles of respiration.  CARDIOVASCULAR: S1, S2 normal. No murmurs, rubs, or gallops.  ABDOMEN: Soft, nontender, nondistended. Bowel sounds present. No organomegaly or mass. PEG tube noted EXTREMITIES: No cyanosis, clubbing or edema b/l.    NEUROLOGIC: Cranial nerves II through XII are intact. No focal Motor or sensory deficits b/l.   PSYCHIATRIC: The patient is alert and oriented x 3.  SKIN: No obvious rash, lesion, or ulcer.   LABORATORY PANEL:   CBC Recent Labs  Lab 06/24/18 0414  WBC 9.9  HGB 9.6*  HCT 27.5*  PLT 358   ------------------------------------------------------------------------------------------------------------------ Chemistries  Recent Labs  Lab 06/24/18 0414  NA 127*  K 4.5  CL 86*  CO2 33*  GLUCOSE 271*  BUN 72*  CREATININE 1.09*  CALCIUM 8.1*  AST 18  ALT 15  ALKPHOS 55  BILITOT 0.4   ------------------------------------------------------------------------------------------------------------------  Cardiac Enzymes Recent Labs  Lab 06/23/18 1243  TROPONINI <0.03   ------------------------------------------------------------------------------------------------------------------  RADIOLOGY:  Dg Chest 2 View  Result Date: 06/23/2018 CLINICAL DATA:  Patient had PEG tube and has been getting 1561ml water in addition to feeding. Feeling weak. States has L lower anterior chest pain. Per daughter has known Rib fracture on  L. DM, GERD, leaky heart valve, HTN EXAM: CHEST - 2 VIEW COMPARISON:  CT of the abdomen and pelvis on 05/28/2018 FINDINGS: Heart size is  mildly enlarged. There is atherosclerotic calcification of the thoracic aorta. The lungs are free of focal consolidations. There is a trace RIGHT pleural effusion. No pulmonary edema. Peg tube in the LEFT UPPER QUADRANT appears unremarkable. IMPRESSION: Mild cardiomegaly without pulmonary edema. Aortic atherosclerosis.  (ICD10-I70.0) Electronically Signed   By: Nolon Nations M.D.   On: 06/23/2018 13:24     ASSESSMENT AND PLAN:   84 year old elderly female patient with history of type 2 diabetes mellitus, esophageal stricture, PEG tube currently for feeding, osteoarthritis, hyperlipidemia, hypertension, meningioma currently under hospitalist service for dehydration and low sodium level  -Hyponatremia improving IV fluid hydration Follow-up electrolytes  -Dehydration resolving with IV fluids  -Esophageal stricture with dysphagia Currently has PEG tube JVD started and Osmolite replaced to avoid diarrhea Appreciate dietary consult  -Hypertension Continue lisinopril with reduced dose  -Bradycardia Beta-blocker on hold  All the records are reviewed and case discussed with Care Management/Social Worker. Management plans discussed with the patient, family and they are in agreement.  CODE STATUS: Full code DVT Prophylaxis: SCDs  TOTAL TIME TAKING CARE OF THIS PATIENT: 33 minutes.   POSSIBLE D/C IN 1 to 2 DAYS, DEPENDING ON CLINICAL CONDITION.  Saundra Shelling M.D on 06/24/2018 at 1:01 PM  Between 7am to 6pm - Pager - [phone]  After 6pm go to [email or web address] - password [email or web address] [facility]  Office  [phone]  CC: Primary care physician; [name], MD  Note: This dictation was prepared with Dragon dictation along with smaller phrase technology. Any transcriptional errors that result from this process are unintentional.

## 2018-06-24 NOTE — Progress Notes (Signed)
Initial Nutrition Assessment  DOCUMENTATION CODES:   Severe malnutrition in context of chronic illness  INTERVENTION:   New tube feed regimen   Jevity 1.5- 4 cans daily + Prostat 79m daily via tube- Flush with 556mbefore and after each feeding   Free water flushes 3050m 4 hrs  Regimen provides 1520kcal/day, 75g/day protein, 1300m32m free water daily, 21g/day of fiber   MVI liquid daily via tube   Vitamin C 250mg54m via tube   NUTRITION DIAGNOSIS:   Severe Malnutrition related to dysphagia as evidenced by severe fat depletion, severe muscle depletion.  GOAL:   Patient will meet greater than or equal to 90% of their needs  MONITOR:   PO intake, Supplement acceptance, Labs, Weight trends, Skin, I & O's  REASON FOR ASSESSMENT:   Consult Enteral/tube feeding initiation and management  ASSESSMENT:   88 y.59 female with a known history of anemia, aortic valve sclerosis, arrhythmia, basal cell cancer of skin, diabetes, esophageal ulcer without bleeding, hyperlipidemia, hypertension, pulmonary hypertension, meningioma-has difficulty swallowing food ongoing for past 1-2 years. [facility] worsening swallowing difficulty recently. Pt now s/p IR G-tube placement 9/11   Met with pt in room today. RD familiar with this patient from recent previous admit on 9/11 when she had her G-tube placed. Pt placed on Osmolite 1.5, 4 cans daily. Pt reports that she has been tolerating her tube feeds well with no nausea or vomiting but she has been having multiple episodes of diarrhea daily. Pt has also been giving herself >1.5L of free water flushes in addition to the water provided by the tube feed formula. Pt has become hyponatremic. RD will switch pt to new tube feed formula today of Jevity 1.5. This will provide pt with 21g/day of fiber and should help with the diarrhea. Patient will need adequate amounts of  water to prevent constipation. Will educate pt on free water flushes and total volume provided from tube feed formula. Per chart, pt has remained weight stable. Pt with improved ecchymosis on her bilateral arms; recommend continue vitamin C for additional 30 days.     Medications reviewed and include: allopurinol, plavix, lovenox, insulin, MVI, protonix, vitamin C  Labs reviewed: Na 127(L), Cl 86(L), BUN 72(H), creat 1.09(H), Ca 8.1(L) adj. 9.22 wnl Hgb 9.6(L), Hct 27.5(L) cbgs- 99, 216, 322, 58 x 24 hrs  NUTRITION - FOCUSED PHYSICAL EXAM:    Most Recent Value  Orbital Region  Moderate depletion  Upper Arm Region  Mild depletion  Thoracic and Lumbar Region  Moderate depletion  Buccal Region  Moderate depletion  Temple Region  Moderate depletion  Clavicle Bone Region  Severe depletion  Clavicle and Acromion Bone Region  Severe depletion  Scapular Bone Region  Severe depletion  Dorsal Hand  Severe depletion  Patellar Region  Severe depletion  Anterior Thigh Region  Severe depletion  Posterior Calf Region  Severe depletion  Edema (RD Assessment)  None  Hair  Reviewed  Eyes  Reviewed  Mouth  Reviewed  Skin  Reviewed [[email or web address]  Reviewed     Diet Order:   Diet Order            Diet NPO time specified  Diet effective now             EDUCATION NEEDS:   Education needs have been addressed  Skin:  Skin Assessment: Reviewed RN Assessment(ecchymosis )  Last  BM:  9/23- type 5  Height:   Ht Readings from Last 1 Encounters:  06/23/18 5' 3.5" (1.613 m)    Weight:   Wt Readings from Last 1 Encounters:  06/23/18 51.3 kg    Ideal Body Weight:  53.4 kg  BMI:  Body mass index is 19.7 kg/m.  Estimated Nutritional Needs:   Kcal:  1300-1500kcal/day   Protein:  67-77g/day   Fluid:  1.3-1.5L/day   Koleen Distance MS, RD, LDN Pager #- [phone] Office#- [phone] After Hours Pager: [phone]

## 2018-06-24 NOTE — Evaluation (Addendum)
Physical Therapy Evaluation Patient Details Name: [name] Day MRN: [id number] DOB: 08/22/1929 Today's Date: 06/24/2018   History of Present Illness  Patient is an 84 year old female admitted for hyponatremia following abnormal reading during a serial lab check.  PMh includes skin CA, brain tumor, diabetes mellitus, esophageal stricture and recent history of PEG tube placement  Clinical Impression  Patient is an 84 year old female who lives in a one story home alone.  She has been using a RW since recent PEG tube placement but is independent with ADL's.  She is also currently working with a [facility] PT.  Pt was independent with bed mobility and able to sit at EOB without assistance.  She presented with fair strength of UE's/LE's and reported some tingling in bilateral feet without numbness.  Pt able to stand with some VC's for proper use of RW and ambulate 200 ft with RW.  She did appear unsteady on feet and PT noted a very narrow BOS which pt was able to correct with cues from PT.  She would return to narrow stance after 10-20 ft of ambulation and require a reminder.  Pt reported fatigue following ambulation but did not take any rest breaks.  Pt's vitals remained WNL during standing activity and pt reported no symptoms.  Pt will continue to benefit from skilled Pt with focus on strength, tolerance to activity, gait training and fall prevention.    Follow Up Recommendations Home health PT Addended 1316, 06/24/2018    Equipment Recommendations  None recommended by PT    Recommendations for Other Services       Precautions / Restrictions Precautions Precautions: Fall Restrictions Weight Bearing Restrictions: No      Mobility  Bed Mobility Overal bed mobility: Independent             General bed mobility comments: Pt easily gets to EOB w/o assist  Transfers Overall transfer level: Needs assistance Equipment used: Rolling walker (2 wheeled) Transfers: Sit to/from Stand Sit to  Stand: Supervision         General transfer comment: Minimal reminders for hand placement when standing.  Able to rise without physical assistance.  Ambulation/Gait Ambulation/Gait assistance: Supervision Gait Distance (Feet): 200 Feet Assistive device: Rolling walker (2 wheeled)     Gait velocity interpretation: <1.8 ft/sec, indicate of risk for recurrent falls General Gait Details: Slow step through pattern with narrow BOS.  Heels occasionally come in contact with one another during swing phase and pt is able to correct when made aware.  PT educated pt concerning relationship between this gait deviation and fall risk.  Stairs            Wheelchair Mobility    Modified Rankin (Stroke Patients Only)       Balance Overall balance assessment: Modified Independent                                           Pertinent Vitals/Pain Pain Assessment: No/denies pain    Home Living Family/patient expects to be discharged to:: Private residence Living Arrangements: Alone Available Help at Discharge: Family;Available 24 hours/day(daughters are alternating staying with pt currently.) Type of Home: House Home Access: Ramped entrance     Home Layout: One level Home Equipment: Walker - 2 wheels;Walker - 4 wheels;Cane - single point;Cane - quad;Tub bench;Hospital bed;Wheelchair - manual Additional Comments: Adaptive equipment in home  due to needs of late husband.    Prior Function Level of Independence: Independent with assistive device(s)         Comments: Has been using RW recently and receiving HH PT following last hospitalization.     Hand Dominance        Extremity/Trunk Assessment   Upper Extremity Assessment Upper Extremity Assessment: Overall WFL for tasks assessed(Grossly 4-/5 bilaterally.)    Lower Extremity Assessment Lower Extremity Assessment: Overall WFL for tasks assessed(Grossly 4-/5 bilaterally. Reports no numbness in feet but  feeling "tingly".)    Cervical / Trunk Assessment Cervical / Trunk Assessment: Kyphotic(Slightly kyphotic)  Communication   Communication: No difficulties  Cognition Arousal/Alertness: Awake/alert Behavior During Therapy: WFL for tasks assessed/performed Overall Cognitive Status: Within Functional Limits for tasks assessed                                 General Comments: Pleasant, follows commands consistently.      General Comments      Exercises Other Exercises Other Exercises: Pt reported difficulty in performing toilet transfer in restroom and PT provided toilet riser.  Pt states that this is closer to her toilet height at home. x4 min Other Exercises: Vitals (BP/HR) monitored in supine, sitting and standing and pt remained WNL.  No sx reported with prolonged standing though pt reported that she felt slightly dizzy when walking to restroom earlier in the day.   Assessment/Plan    PT Assessment Patient needs continued PT services  PT Problem List Decreased strength;Decreased mobility;Decreased knowledge of use of DME       PT Treatment Interventions DME instruction;Therapeutic activities;Gait training;Functional mobility training;Balance training;Neuromuscular re-education;Patient/family education    PT Goals (Current goals can be found in the Care Plan section)  Acute Rehab PT Goals Patient Stated Goal: To return home and continue working with [facility] PT PT Goal Formulation: With patient Time For Goal Achievement: 07/08/18 Potential to Achieve Goals: Good    Frequency Min 2X/week   Barriers to discharge        Co-evaluation               AM-PAC PT "6 Clicks" Daily Activity  Outcome Measure Difficulty turning over in bed (including adjusting bedclothes, sheets and blankets)?: None Difficulty moving from lying on back to sitting on the side of the bed? : None Difficulty sitting down on and standing up from a chair with arms (e.g., wheelchair,  bedside commode, etc,.)?: A Little Help needed moving to and from a bed to chair (including a wheelchair)?: None Help needed walking in hospital room?: A Little Help needed climbing 3-5 steps with a railing? : A Little 6 Click Score: 21    End of Session Equipment Utilized During Treatment: Gait belt Activity Tolerance: Patient tolerated treatment well Patient left: with call bell/phone within reach;with family/visitor present(Chair alarm in place but no box present to plug the alarm in.  RN notified.) Nurse Communication: Mobility status PT Visit Diagnosis: Unsteadiness on feet (R26.81);Other abnormalities of gait and mobility (R26.89);Muscle weakness (generalized) (M62.81)    Time: 6578-4696 PT Time Calculation (min) (ACUTE ONLY): 29 min   Charges:   PT Evaluation $PT Eval Low Complexity: 1 Low PT Treatments $Gait Training: 8-22 mins        Roxanne Gates, PT, DPT   Roxanne Gates 06/24/2018, 3:07 PM, ADDENDED 2952, 06/24/2018

## 2018-06-25 LAB — BASIC METABOLIC PANEL
Anion gap: 7 (ref 5–15)
BUN: 50 mg/dL — ABNORMAL HIGH (ref 8–23)
CO2: 34 mmol/L — ABNORMAL HIGH (ref 22–32)
Calcium: 8.6 mg/dL — ABNORMAL LOW (ref 8.9–10.3)
Chloride: 95 mmol/L — ABNORMAL LOW (ref 98–111)
Creatinine, Ser: 0.79 mg/dL (ref 0.44–1.00)
GFR calc Af Amer: >60 mL/min (ref >60)
GFR calc non Af Amer: >60 mL/min (ref >60)
Glucose, Bld: 115 mg/dL — ABNORMAL HIGH (ref 70–99)
Potassium: 4.8 mmol/L (ref 3.5–5.1)
Sodium: 136 mmol/L (ref 135–145)

## 2018-06-25 LAB — GLUCOSE, CAPILLARY
Glucose-Capillary: 117 mg/dL — ABNORMAL HIGH (ref 70–99)
Glucose-Capillary: 174 mg/dL — ABNORMAL HIGH (ref 70–99)

## 2018-06-25 MED ORDER — JEVITY 1.5 CAL/FIBER PO LIQD
237.0000 mL | Freq: Four times a day (QID) | ORAL | Status: DC
  Administered 2018-06-25: 237 mL

## 2018-06-25 MED ORDER — PANTOPRAZOLE SODIUM 40 MG PO PACK: 40.0000 mg | PACK | Freq: Two times a day (BID) | ORAL | 1 refills | Status: DC

## 2018-06-25 MED ORDER — LISINOPRIL 10 MG PO TABS: 10.0000 mg | ORAL_TABLET | Freq: Every day | ORAL | Status: DC

## 2018-06-25 MED ORDER — ENOXAPARIN SODIUM 40 MG/0.4ML ~~LOC~~ SOLN: 40.0000 mg | SUBCUTANEOUS | Status: DC

## 2018-06-25 MED ORDER — LISINOPRIL 10 MG PO TABS: 10.0000 mg | ORAL_TABLET | Freq: Every day | ORAL | 2 refills | Status: DC

## 2018-06-25 MED ORDER — FREE WATER: 30.0000 mL | Status: DC

## 2018-06-25 MED ORDER — JEVITY 1.5 CAL/FIBER PO LIQD: 237.0000 mL | Freq: Four times a day (QID) | ORAL | 90 refills | Status: DC

## 2018-06-25 NOTE — Care Management Note (Signed)
Case Management Note  Patient Details  Name: [name] Day MRN: [id number] Date of Birth: 09-28-1929   Patient admitted with hyponatremia. lives at home alone.  2 adult daughters lives locally for support.  PCP [facility].  At baseline patient ambulates with a cane.  Patient  has a hospital; bed, WC, RW, and lift in the home that belonged to her late husband.  Patient recently discharged with new peg tube in place.  Patient open with [facility].  Patient to discharge on new Jevity feedings.  Patient was provided with 7 cans prior to discharge.  Corene Cornea with [facility] notified of discharge.  [facility] has the orders for the new feeds, and will be delivering new feeds to the home. RNCM signing off.   Subjective/Objective:                    Action/Plan:   Expected Discharge Date:  06/25/18               Expected Discharge Plan:  [place]  In-House Referral:     Discharge planning Services  CM Consult  Post Acute Care Choice:  Resumption of Svcs/PTA Provider Choice offered to:     DME Arranged:    DME Agency:     HH Arranged:  RN, PT, Nurse's Aide [place] Agency:  [facility]  Status of Service:  Completed, signed off  If discussed at [place] of Stay Meetings, dates discussed:    Additional Comments:  Beverly Sessions, RN 06/25/2018, 3:54 PM

## 2018-06-25 NOTE — Discharge Summary (Signed)
[facility] [place] [name], 84 y.o., DOB 12-Dec-1928, MRN [id number]. Admission date: 06/23/2018 Discharge Date 06/25/2018 Primary MD [name], MD Admitting Physician Nicholes Mango, MD  Admission Diagnosis  Acute hyponatremia [E87.1]  Discharge Diagnosis   Active Problems:   Hyponatremia Dehydration esophageal stricture with dysphagia status post PEG tube Hypertension Bradycardia      Hospital Course  [name]  is a 84 y.o. female with a known history of diabetes mellitus, esophageal stricture recent history of PEG tube placement is presenting to the ED with a chief complaint of abnormal lab. Pt was noted to have hyponatremia and was noted to be dehydrated.  Patient was admitted given IV fluids.  Her tube feeds were changed and now she is not having as loose stools as previously.  Patient was seen by physical therapy who recommended home health.  Patient is doing much better.          Consults  None  Significant Tests:  See full reports for all details     Dg Chest 2 View  Result Date: 06/23/2018 CLINICAL DATA:  Patient had PEG tube and has been getting 1543ml water in addition to feeding. Feeling weak. States has L lower anterior chest pain. Per daughter has known Rib fracture on L. DM, GERD, leaky heart valve, HTN EXAM: CHEST - 2 VIEW COMPARISON:  CT of the abdomen and pelvis on 05/28/2018 FINDINGS: Heart size is mildly enlarged. There is atherosclerotic calcification of the thoracic aorta. The lungs are free of focal consolidations. There is a trace RIGHT pleural effusion. No pulmonary edema. Peg tube in the LEFT UPPER QUADRANT appears unremarkable. IMPRESSION: Mild cardiomegaly without pulmonary edema. Aortic atherosclerosis.  (ICD10-I70.0) Electronically Signed   By: Nolon Nations M.D.   On: 06/23/2018 13:24   Ct Chest W Contrast  Result Date: 05/28/2018 CLINICAL DATA:  Dysphagia, weight loss, vomiting. EXAM: CT  CHEST WITH CONTRAST TECHNIQUE: Multidetector CT imaging of the chest was performed during intravenous contrast administration. CONTRAST:  67mL ISOVUE-300 IOPAMIDOL (ISOVUE-300) INJECTION 61% COMPARISON:  04/05/2017 FINDINGS: Cardiovascular: The heart is top-normal in size. No pericardial effusion. No evidence of thoracic aortic aneurysm. Atherosclerotic calcifications of the aortic arch/root. Three vessel coronary atherosclerosis. Mediastinum/Nodes: No suspicious mediastinal lymphadenopathy. Visualized thyroid is mildly heterogeneous/nodular. Mild wall thickening involving the mid/distal esophagus (for example, series 3/image 98), possibly reflecting esophagitis. Suspected stricture of the distal esophagus at the GE junction, with mild submucosal edema (series 3/image 115). This may reflect a post infectious/inflammatory stricture, although underlying neoplasm is not entirely excluded. Lungs/Pleura: Biapical pleural-parenchymal scarring, right greater than left. Evaluation of the lung parenchyma is constrained by respiratory motion. Within that constraint, there are no suspicious pulmonary nodules. Mild subpleural scarring in the right lower lobe. No focal consolidation. No pleural effusion or pneumothorax. Upper Abdomen: Visualized upper abdomen is grossly unremarkable. Musculoskeletal: Degenerative changes of the visualized thoracolumbar spine. Exaggerated upper thoracic kyphosis. Cervical spine fixation hardware, incompletely visualized. IMPRESSION: Distal esophageal stricture at the GE junction with associated mild submucosal edema, possibly reflecting a post infectious/inflammatory stricture, underlying neoplasm not entirely excluded. Consider endoscopy for further evaluation as clinically warranted. Associated mild wall thickening involving the mid/distal esophagus, possibly reflecting esophagitis. Otherwise negative CT chest. Aortic Atherosclerosis (ICD10-I70.0). Electronically Signed   By: Julian Hy  M.D.   On: 05/28/2018 14:10   [place] Intraoperative  Result Date: 06/11/2018 CLINICAL DATA:  Ultrasound was provided for use by the ordering physician, and a technical  charge was applied by the performing facility.  No radiologist interpretation/professional services rendered.   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 Please perform percutaneous gastrostomy tube placement for enteric nutrition supplementation purposes. EXAM: PUSH GASTROSTOMY TUBE PLACEMENT COMPARISON:  Chest CT - 05/28/2018; CT of the abdomen and pelvis - 07/09/201 MEDICATIONS: Ancef 2 gm IV; Antibiotics were administered within 1 hour of the procedure. CONTRAST:  20 mL of Isovue 300 administered into the gastric lumen. ANESTHESIA/SEDATION: Moderate (conscious) sedation was employed during this procedure. A total of Versed 2 mg and Fentanyl 125 mcg was administered intravenously. Moderate Sedation Time: 45 minutes. The patient's level of consciousness and vital signs were monitored continuously by radiology nursing throughout the procedure under my direct supervision. FLUOROSCOPY TIME:  8 minutes (77 mGy) COMPLICATIONS: None immediate. PROCEDURE: Informed written consent was obtained from the patient and the patient's family following explanation of the procedure, risks, benefits and alternatives. A time out was performed prior to the initiation of the procedure. Ultrasound scanning was performed to demarcate the edge of the left lobe of the liver. Maximal barrier sterile technique utilized including caps, mask, sterile gowns, sterile gloves, large sterile drape, hand hygiene and Betadine prep. The left upper quadrant was sterilely prepped and draped. A oral gastric catheter was inserted into the stomach under fluoroscopy. Note was made of difficulty advancing the catheter beyond the gastroesophageal junction. Contrast injection demonstrated  marking narrowing of the gastroesophageal junction however ultimately with the use of a Bentson wire, the Kumpe catheter was advanced through the GE junction to the level of the gastric fundus. Contrast injection confirmed appropriate positioning. The left costal margin and barium opacified transverse colon were identified and avoided. Air was injected into the stomach for insufflation and visualization under fluoroscopy. Under sterile conditions and local anesthesia, 2 T tacks were utilized to pexy the anterior aspect of the stomach against the ventral abdominal wall. Contrast injection confirmed appropriate positioning of each of the T tacks. An incision was made between the T tacks and a 17 gauge trocar needle was utilized to access the stomach. Needle position was confirmed within the stomach with aspiration of air and injection of a small amount of contrast. A stiff Glidewire was advanced into the gastric lumen and under intermittent fluoroscopic guidance, the access needle was exchanged for a Kumpe catheter which was advanced to the level of the gastric antrum. Under intermittent fluoroscopic guidance, the Kumpe catheter was exchanged for a telescoping peel-away sheath, ultimately allowing placement of a 18-French balloon retention gastrostomy tube. The retention balloon was insufflated with a mixture of dilute saline and contrast and pulled taut against the anterior wall of the stomach. The external disc was cinched. Contrast injection confirms positioning within the stomach. Several spot radiographic images were obtained in various obliquities for documentation. The patient tolerated procedure well without immediate post procedural complication. FINDINGS: High-grade stricture involving the distal esophagus/gastroesophageal junction. After successful fluoroscopic guided placement, the gastrostomy tube is appropriately positioned with internal retention balloon against the ventral aspect of the gastric lumen.  IMPRESSION: Successful fluoroscopic insertion of an 59 French balloon retention gastrostomy tube. The gastrostomy may be used immediately for medication administration and in 24 hrs for the initiation of feeds. Electronically Signed   By: Sandi Mariscal M.D.   On: 06/11/2018 14:55       Today   Subjective:   [name] patient feels better  Objective:  Blood pressure (!) 128/42, pulse (!) 58, temperature 98.4 F (36.9 C), temperature source Oral, resp. rate 16, height 5' 3.5" (1.613 m), weight 51.3 kg, SpO2 99 %.  .  Intake/Output Summary (Last 24 hours) at 06/25/2018 1415 Last data filed at 06/24/2018 2029 Gross per 24 hour  Intake 180 ml  Output -  Net 180 ml    Exam VITAL SIGNS: Blood pressure (!) 128/42, pulse (!) 58, temperature 98.4 F (36.9 C), temperature source Oral, resp. rate 16, height 5' 3.5" (1.613 m), weight 51.3 kg, SpO2 99 %.  GENERAL:  84 y.o.-year-old patient lying in the bed with no acute distress.  EYES: Pupils equal, round, reactive to light and accommodation. No scleral icterus. Extraocular muscles intact.  HEENT: Head atraumatic, normocephalic. Oropharynx and nasopharynx clear.  NECK:  Supple, no jugular venous distention. No thyroid enlargement, no tenderness.  LUNGS: Normal breath sounds bilaterally, no wheezing, rales,rhonchi or crepitation. No use of accessory muscles of respiration.  CARDIOVASCULAR: S1, S2 normal. No murmurs, rubs, or gallops.  ABDOMEN: Soft, nontender, nondistended. Bowel sounds present. No organomegaly or mass.  EXTREMITIES: No pedal edema, cyanosis, or clubbing.  NEUROLOGIC: Cranial nerves II through XII are intact. Muscle strength 5/5 in all extremities. Sensation intact. Gait not checked.  PSYCHIATRIC: The patient is alert and oriented x 3.  SKIN: No obvious rash, lesion, or ulcer.   Data Review     CBC w Diff:  Lab Results  Component Value Date   WBC 9.9 06/24/2018   HGB 9.6 (L) 06/24/2018   HGB 12.3 12/23/2014    HCT 27.5 (L) 06/24/2018   PLT 358 06/24/2018   LYMPHOPCT 24 05/28/2018   MONOPCT 8 05/28/2018   EOSPCT 3 05/28/2018   BASOPCT 1 05/28/2018   CMP:  Lab Results  Component Value Date   NA 136 06/25/2018   K 4.8 06/25/2018   K 4.1 12/23/2014   CL 95 (L) 06/25/2018   CO2 34 (H) 06/25/2018   BUN 50 (H) 06/25/2018   CREATININE 0.79 06/25/2018   CREATININE 0.71 01/30/2018   PROT 5.6 (L) 06/24/2018   ALBUMIN 2.6 (L) 06/24/2018   BILITOT 0.4 06/24/2018   ALKPHOS 55 06/24/2018   AST 18 06/24/2018   ALT 15 06/24/2018  .  Micro Results No results found for this or any previous visit (from the past 240 hour(s)).      Code Status Orders  (From admission, onward)         Start     Ordered   06/23/18 1723  Full code  Continuous     06/23/18 1723        Code Status History    Date Active Date Inactive Code Status Order ID Comments User Context   06/11/2018 1559 06/13/2018 1803 Full Code [id number]  Vaughan Basta, MD Inpatient   03/18/2017 1033 03/18/2017 1544 Full Code [id number]  Algernon Huxley, MD Inpatient    Advance Directive Documentation     Most Recent Value  Type of Advance Directive  Healthcare Power of Attorney, Living will [Bonnie Drye/Daughter/POA]  Pre-existing out of facility DNR order (yellow form or pink MOST form)  -  "MOST" Form in Place?  -          Follow-up Information    Hande, Vishwanath, MD Follow up in 6 [name](s).   Specialty:  Internal Medicine Contact information: [address] [place]  [address] [phone]           Discharge Medications  Allergies as of 06/25/2018      Reactions   Gabapentin Swelling   Lipitor [atorvastatin] Other (See Comments)   Muscle aches   Mevacor [lovastatin] Other (See Comments)   Muscle aches   Milk-related Compounds    Large quantities cause headaches    Septra [sulfamethoxazole-trimethoprim]    Statins Other (See Comments)   Muscle pain   Sulfur Diarrhea,  Nausea And Vomiting   Zocor [simvastatin] Other (See Comments)   Muscle aches      Medication List    STOP taking these medications   furosemide 10 MG/ML solution Commonly known as:  LASIX   hydrochlorothiazide 25 MG tablet Commonly known as:  HYDRODIURIL   Metoprolol Tartrate 37.5 MG Tabs   pantoprazole sodium 40 mg/20 mL Pack Commonly known as:  PROTONIX   sertraline 20 MG/ML concentrated solution Commonly known as:  ZOLOFT   sucralfate 1 GM/10ML suspension Commonly known as:  CARAFATE     TAKE these medications   acetaminophen 160 MG/5ML solution Commonly known as:  TYLENOL Place 20.3 mLs (650 mg total) into feeding tube every 6 (six) hours as needed for mild pain.   allopurinol 20 mg/mL Susp Commonly known as:  ZYLOPRIM Place 5 mLs (100 mg total) into feeding tube at bedtime.   ascorbic acid 250 MG tablet Commonly known as:  VITAMIN C Place 1 tablet (250 mg total) into feeding tube 2 (two) times daily.   clopidogrel 75 MG tablet Commonly known as:  PLAVIX Place 1 tablet (75 mg total) into feeding tube daily.   feeding supplement (JEVITY 1.5 CAL/FIBER) Liqd Place 237 mLs into feeding tube 4 (four) times daily. What changed:  when to take this   feeding supplement (PRO-STAT SUGAR FREE 64) Liqd Place 30 mLs into feeding tube daily.   free water Soln Place 30 mLs into feeding tube every 4 (four) hours.   GENTEAL MILD 0.2 % Soln Generic drug:  Hypromellose Place 1 drop into both eyes 3 (three) times daily.   glipiZIDE 5 MG tablet Commonly known as:  GLUCOTROL Place 1 tablet (5 mg total) into feeding tube daily before breakfast.   hydrALAZINE 50 MG tablet Commonly known as:  APRESOLINE Place 1 tablet (50 mg total) into feeding tube 2 (two) times daily.   lansoprazole 30 MG disintegrating tablet Commonly known as:  PREVACID SOLUTAB Take 30 mg by mouth daily.   lidocaine 5 % Commonly known as:  LIDODERM Place 1 patch onto the skin daily. Remove &  Discard patch within 12 hours or as directed by MD   lisinopril 10 MG tablet Commonly known as:  PRINIVIL,ZESTRIL Place 1 tablet (10 mg total) into feeding tube daily. Start taking on:  06/26/2018 What changed:    medication strength  how much to take   meclizine 25 MG tablet Commonly known as:  ANTIVERT Take 25 mg by mouth 3 (three) times daily as needed for dizziness or nausea.   multivitamin Liqd Place 15 mLs into feeding tube daily.   rOPINIRole 2 MG tablet Commonly known as:  REQUIP Place 1 tablet (2 mg total) into feeding tube 2 (two) times daily.          Total Time in preparing paper work, data evaluation and todays exam - 38 minutes  Dustin Flock M.D on 06/25/2018 at 2:15 PM [place]  [phone]

## 2018-07-03 ENCOUNTER — Inpatient Hospital Stay: Payer: Medicare Other | Attending: Hematology and Oncology

## 2018-07-03 DIAGNOSIS — D509 Iron deficiency anemia, unspecified: Secondary | ICD-10-CM | POA: Insufficient documentation

## 2018-07-03 NOTE — Progress Notes (Signed)
Nutrition Follow-up:  Patient followed by Dr. Mike Gip for iron deficiency anemia.  Patient has history of CMV esophagitis, esophageal stenosis and has been unable to eat solid foods.  PEG tube placed on 9/11.    Met with patient and daughter [name] today in clinic.  Patient reports that she is feeling stronger, has been having PT 2 times per week.  Reports that she is taking 4 cartons of jevity 1.5 (1 carton at 6:30am, 12:30, 4:30 and 9:30pm).  Flushes with 63m of water before and after each feeding.  Uses small amount of water to mix crushed medications and deliver via tube.  Also provides additional free water flush of 349mat 9:30am, 2:30pm and 4:30pm.  Giving promod 3063maily usually at 6:30am feeding, has not been mixing with water. Patient reports diarrhea is better since changing to jevity 1.5 formula.  Currently taking antibiotic for UTI. Reports that she has 2 loose not watery stools usually in the morning. Also passes gas usually after feeding and may pass a little stool at that time.  Overall feels that diarrhea is better.    Daughter reporting that she does not have supplies for tube feeding, has only gotten the formula and promod.  Frustrated that tube does not have a clamp on it.  Patient having hard time managing holding everything when trying to feed.  Patient unable to take anything by mouth due to esophageal stricture.   Medications: still taking MVI and Vit C. Patient has been taking off diuretic  Labs: 9/25 Na 136, BUN 50, creatinine WNL  Anthropometrics:   Weight increased to 116 lb 7 oz today in clinic from 113 lb on 9/23.    Weight 116 lb on 8/29   Estimated Energy Needs  Kcals: [id number]/d Protein: 64-77 g Fluid: 1.5 L/d  NUTRITION DIAGNOSIS: Inadequate oral intake continues with stricture and placement of tube   INTERVENTION:  Encouraged patient to be giving 480m17m0ml free water daily, with water flushes.  Patient already getting 720ml36mughter  aware of water amount in formula) free water from formula and with additional water mentioned above will reach 1200-1500ml 64my to keep hydrated.  Discussed proper mixing of promod (mix with 30ml o3mter. Flush with 30ml of75mer before and after giving promod and can't be mixed with tube feeding formula.   Call placed to [facility] supplies for patient.  Spoke with representative and will follow-up.   If patient continues with loose stool following antibiotics course may benefit from banana flakes to improve loose stools.     MONITORING, EVALUATION, GOAL: weight trends, TF tolerance   NEXT VISIT: Monday, November 18 after lab work  [name] B. [employer], RZenia ResidesN [place]gi[place]ored Dietitian [phone]

## 2018-07-04 ENCOUNTER — Telehealth: Payer: Self-pay

## 2018-07-04 NOTE — Telephone Encounter (Signed)
Nutrition  Called and spoke with daughter [name] regarding follow-up from [facility] regarding supplies.  [name] reports [facility] has been in contact with them.  Also provided follow-up regarding tube feeding clamp as daughter with questions regarding how to get one.   [name] appreciative of follow-up.  Margareth Kanner B. Zenia Resides, [place], [place] Registered Dietitian [phone] (pager)

## 2018-07-10 ENCOUNTER — Inpatient Hospital Stay: Payer: Medicare Other

## 2018-07-10 DIAGNOSIS — D508 Other iron deficiency anemias: Secondary | ICD-10-CM

## 2018-07-10 DIAGNOSIS — D509 Iron deficiency anemia, unspecified: Secondary | ICD-10-CM | POA: Diagnosis not present

## 2018-07-10 LAB — CBC WITH DIFFERENTIAL/PLATELET
Abs Immature Granulocytes: 0.02 10*3/uL (ref 0.00–0.07)
Basophils Absolute: 0 10*3/uL (ref 0.0–0.1)
Basophils Relative: 1 %
Eosinophils Absolute: 0.3 10*3/uL (ref 0.0–0.5)
Eosinophils Relative: 4 %
HCT: 29.9 % — ABNORMAL LOW (ref 36.0–46.0)
Hemoglobin: 9.2 g/dL — ABNORMAL LOW (ref 12.0–15.0)
Immature Granulocytes: 0 %
Lymphocytes Relative: 12 %
Lymphs Abs: 0.8 10*3/uL (ref 0.7–4.0)
MCH: 28 pg (ref 26.0–34.0)
MCHC: 30.8 g/dL (ref 30.0–36.0)
MCV: 90.9 fL (ref 80.0–100.0)
Monocytes Absolute: 0.5 10*3/uL (ref 0.1–1.0)
Monocytes Relative: 8 %
Neutro Abs: 5.1 10*3/uL (ref 1.7–7.7)
Neutrophils Relative %: 75 %
Platelets: 373 10*3/uL (ref 150–400)
RBC: 3.29 MIL/uL — ABNORMAL LOW (ref 3.87–5.11)
RDW: 13.9 % (ref 11.5–15.5)
WBC: 6.8 10*3/uL (ref 4.0–10.5)
nRBC: 0 % (ref 0.0–0.2)

## 2018-07-10 LAB — FERRITIN: Ferritin: 50 ng/mL (ref 11–307)

## 2018-08-18 ENCOUNTER — Other Ambulatory Visit: Payer: Self-pay

## 2018-08-18 ENCOUNTER — Inpatient Hospital Stay: Payer: Medicare Other | Attending: Hematology and Oncology

## 2018-08-18 ENCOUNTER — Inpatient Hospital Stay: Payer: Medicare Other

## 2018-08-18 DIAGNOSIS — D508 Other iron deficiency anemias: Secondary | ICD-10-CM

## 2018-08-18 DIAGNOSIS — D509 Iron deficiency anemia, unspecified: Secondary | ICD-10-CM | POA: Insufficient documentation

## 2018-08-18 LAB — CBC WITH DIFFERENTIAL/PLATELET
Abs Immature Granulocytes: 0.02 10*3/uL (ref 0.00–0.07)
Basophils Absolute: 0.1 10*3/uL (ref 0.0–0.1)
Basophils Relative: 1 %
Eosinophils Absolute: 0.4 10*3/uL (ref 0.0–0.5)
Eosinophils Relative: 7 %
HCT: 28.9 % — ABNORMAL LOW (ref 36.0–46.0)
Hemoglobin: 8.9 g/dL — ABNORMAL LOW (ref 12.0–15.0)
Immature Granulocytes: 0 %
Lymphocytes Relative: 17 %
Lymphs Abs: 1 10*3/uL (ref 0.7–4.0)
MCH: 27.6 pg (ref 26.0–34.0)
MCHC: 30.8 g/dL (ref 30.0–36.0)
MCV: 89.5 fL (ref 80.0–100.0)
Monocytes Absolute: 0.5 10*3/uL (ref 0.1–1.0)
Monocytes Relative: 9 %
Neutro Abs: 3.8 10*3/uL (ref 1.7–7.7)
Neutrophils Relative %: 66 %
Platelets: 308 10*3/uL (ref 150–400)
RBC: 3.23 MIL/uL — ABNORMAL LOW (ref 3.87–5.11)
RDW: 14 % (ref 11.5–15.5)
WBC: 5.7 10*3/uL (ref 4.0–10.5)
nRBC: 0 % (ref 0.0–0.2)

## 2018-08-18 LAB — FERRITIN: Ferritin: 36 ng/mL (ref 11–307)

## 2018-08-18 NOTE — Progress Notes (Signed)
Nutrition Follow-up:  Patient followed by Dr. Mike Gip for iron deficency anemia.  Patient has history of CMV esophagitis, esophageal stenosis and has been unable to eat solid foods.  PEG tube placed on 9/11.  Met with patient and daughter [name] today in clinic following lab work.  Patient reports that she has only been able to get in about 3-3 1/2 cartons of jevity 1.5 daily due to gas and bloating.  Reports does sometimes pass loose stool with gas.  Sometimes takes imodium for loose stool and then has constipation.    Reports that she is taking promod 1 time daily.  Continues to flush with 89m of water before and after each feeding (4) and flushes with 314mwater at 9:30, 2:30 and 4:30pm.    Patient reports clasp on end of tube has come off and using another stopper for end of PEG tube.  GI, NP aware.     Medications: reviewed  Labs: reviewed  Anthropometrics:   Weight taken today 114 lb 7 oz in clinic decreased from 116 lb on 10/3.     Estimated Energy Needs  Kcals: 1275-1530 calories/d Protein: 64-77 g/d Fluid: 1.5 L/d  NUTRITION DIAGNOSIS: Inadequate oral intake continues with stricture and placement of tube    INTERVENTION:  Discussed option of providing 2 cartons  osmolite 1.5 (no fiber formula) with 2 cartons of jevity 1.5 (fiber containing) to help with gas and bloating.  Patient declined at this time.   Discussed dividing tube feeding over 5 feeding vs 4 to be able to get in all 4 cartons.  Needs calories and protein to prevent weight loss. Question if simethicone would be an option to help with gas.  Will send message to MD and NP.      MONITORING, EVALUATION, GOAL: weight trends, TF tolerance   NEXT VISIT: phone follow-up in 1 weeks  Narjis Mira B. AlZenia ResidesRDDallasLDSunset Villageegistered Dietitian [phone])

## 2018-08-19 ENCOUNTER — Telehealth: Payer: Self-pay

## 2018-08-19 ENCOUNTER — Other Ambulatory Visit: Payer: Self-pay

## 2018-08-19 NOTE — Telephone Encounter (Signed)
Nutrition  Called to let patient know that [facility] would be mailing adaptor to fit on the end of feeding tube as current cap coming off.  Patient has stopper in the end of feeding port currently.  Patient appreciative of call.  Waleska Buttery B. Zenia Resides, [place], [place] Registered Dietitian [phone] (pager)

## 2018-08-22 ENCOUNTER — Other Ambulatory Visit: Payer: Self-pay | Admitting: *Deleted

## 2018-08-22 ENCOUNTER — Inpatient Hospital Stay: Payer: Medicare Other

## 2018-08-22 ENCOUNTER — Inpatient Hospital Stay (HOSPITAL_BASED_OUTPATIENT_CLINIC_OR_DEPARTMENT_OTHER): Payer: Medicare Other | Admitting: Hematology and Oncology

## 2018-08-22 ENCOUNTER — Encounter: Payer: Self-pay | Admitting: Hematology and Oncology

## 2018-08-22 VITALS — BP 134/73 | HR 61 | Temp 96.0°F | Resp 18

## 2018-08-22 VITALS — BP 154/69 | HR 66 | Temp 97.9°F | Resp 18 | Wt 114.4 lb

## 2018-08-22 DIAGNOSIS — D509 Iron deficiency anemia, unspecified: Secondary | ICD-10-CM | POA: Diagnosis not present

## 2018-08-22 DIAGNOSIS — E43 Unspecified severe protein-calorie malnutrition: Secondary | ICD-10-CM

## 2018-08-22 DIAGNOSIS — D508 Other iron deficiency anemias: Secondary | ICD-10-CM

## 2018-08-22 MED ORDER — SODIUM CHLORIDE 0.9 % IV SOLN
Freq: Once | INTRAVENOUS | Status: AC
  Administered 2018-08-22: 14:00:00 via INTRAVENOUS
  Filled 2018-08-22: qty 250

## 2018-08-22 MED ORDER — IRON SUCROSE 20 MG/ML IV SOLN
200.0000 mg | Freq: Once | INTRAVENOUS | Status: AC
  Administered 2018-08-22: 200 mg via INTRAVENOUS
  Filled 2018-08-22: qty 10

## 2018-08-22 NOTE — Progress Notes (Signed)
[facility] day: 08/22/2018   Chief Complaint: [name] Day is a 84 y.o. female with iron deficiency anemia who is seen for review of interval chest CT and discussion regarding direction of therapy.  HPI:   The patient was last seen in the hematology clinic on 05/29/2018.  At that time, she felt "about the same".  She was trying to eat.  She was scheduled to see GI the following day.  She was to continue to follow-up with nutrition.  Labs on 05/28/2018 revealed a hematocrit of 33.7, hemoglobin 11.2, and MCV 89.  B12 was 401.  Folate was 31.  Ferritin was 41 on 05/21/2018.  She saw GI on 06/19/2018, 07/18/2018, and 07/22/2018.  Notes reviewed.  On 06/19/2018, she was seen for follow-up after IR G tube placement.  On 07/18/2018, she had a mild cellulitis around her PEG tube.  She was to go to IR for t-tack removal.  She was started on Keflex.  On 07/22/2018, she had PEG site pain and cellulitis and was finishing a course of Keflex.  CBC has been followed: 06/24/2018: hematocrit of 27.5, hemoglobin 9.6, and MCV 86.2.  Creatinine was 0.79 on 06/25/2018. 07/10/2018: hematocrit of 29.9, hemoglobin 9.2, and MCV 90.9.  Ferritin was 50. 08/18/2018: hematocrit of 28.9, hemoglobin 8.9, and MCV 89.5.  Ferritin was 36.  During the interim, she has been "doing better".  She states that she "feels almost human".  She is not taking anything by mouth.  She will taste (< 1 teaspoon) soup or apple sauce.  She is taking Jevity.  She notes "a lot of gas".   Past Medical History:  Diagnosis Date  . Anemia   . Aortic valve sclerosis   . Arrhythmia   . Arthritis    osteoarthritis  . Basal cell carcinoma of skin   . Brain tumor ([place])   . Brain tumor ([place])   . Cervical spine disease   . Diabetes mellitus without complication ([place])   . Dysrhythmia    sinus arrhythmia  . Esophageal ulcer without bleeding   . GERD (gastroesophageal reflux disease)   . History of  kidney stones   . Hyperlipemia   . Hypertension   . Kidney stones   . Leaky heart valve   . Meningioma ([place])   . 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 Pulmonary hypertension ([place])     Past Surgical History:  Procedure Laterality Date  . ABDOMINAL HYSTERECTOMY    . APPENDECTOMY    . ARTHROGRAM KNEE Left   . COLONOSCOPY WITH PROPOFOL N/A 06/20/2017   Procedure: COLONOSCOPY WITH PROPOFOL;  Surgeon: Lollie Sails, MD;  Location: [facility] ENDOSCOPY;  Service: Endoscopy;  Laterality: N/A;  . ESOPHAGOGASTRODUODENOSCOPY (EGD) WITH PROPOFOL N/A 03/14/2015   Procedure: ESOPHAGOGASTRODUODENOSCOPY (EGD) WITH PROPOFOL;  Surgeon: Josefine Class, MD;  Location: [facility] ENDOSCOPY;  Service: Endoscopy;  Laterality: N/A;  . ESOPHAGOGASTRODUODENOSCOPY (EGD) WITH PROPOFOL N/A 03/28/2017   Procedure: ESOPHAGOGASTRODUODENOSCOPY (EGD) WITH PROPOFOL;  Surgeon: Lollie Sails, MD;  Location: [facility] ENDOSCOPY;  Service: Endoscopy;  Laterality: N/A;  . ESOPHAGOGASTRODUODENOSCOPY (EGD) WITH PROPOFOL N/A 06/20/2017   Procedure: ESOPHAGOGASTRODUODENOSCOPY (EGD) WITH PROPOFOL;  Surgeon: Lollie Sails, MD;  Location: [facility] ENDOSCOPY;  Service: Endoscopy;  Laterality: N/A;  . ESOPHAGOGASTRODUODENOSCOPY (EGD) WITH PROPOFOL N/A 10/21/2017   Procedure: ESOPHAGOGASTRODUODENOSCOPY (EGD) WITH PROPOFOL;  Surgeon: Lollie Sails, MD;  Location: [facility] ENDOSCOPY;  Service: Endoscopy;  Laterality: N/A;  . ESOPHAGOGASTRODUODENOSCOPY (EGD) WITH PROPOFOL  N/A 04/15/2018   Procedure: ESOPHAGOGASTRODUODENOSCOPY (EGD) WITH PROPOFOL;  Surgeon: Lollie Sails, MD;  Location: [facility] ENDOSCOPY;  Service: Endoscopy;  Laterality: N/A;  . EYE SURGERY    . HYSTERECTOMY ABDOMINAL WITH SALPINGECTOMY    . IR GASTROSTOMY TUBE MOD SED  06/11/2018  . PERIPHERAL VASCULAR CATHETERIZATION N/A 01/23/2016   Procedure: Visceral Venography;  Surgeon: Algernon Huxley, MD;  Location: [place] CV LAB;  Service: Cardiovascular;  Laterality: N/A;  .  PERIPHERAL VASCULAR CATHETERIZATION  01/23/2016   Procedure: Peripheral Vascular Intervention;  Surgeon: Algernon Huxley, MD;  Location: [place] CV LAB;  Service: Cardiovascular;;  . UPPER ESOPHAGEAL ENDOSCOPIC ULTRASOUND (EUS) N/A 12/05/2017   Procedure: UPPER ESOPHAGEAL ENDOSCOPIC ULTRASOUND (EUS);  Surgeon: Jola Schmidt, MD;  Location: [facility] ENDOSCOPY;  Service: Endoscopy;  Laterality: N/A;  . VISCERAL ANGIOGRAPHY N/A 03/18/2017   Procedure: Visceral Angiography;  Surgeon: Algernon Huxley, MD;  Location: [place] CV LAB;  Service: Cardiovascular;  Laterality: N/A;  . VISCERAL ARTERY INTERVENTION N/A 03/18/2017   Procedure: Visceral Artery Intervention;  Surgeon: Algernon Huxley, MD;  Location: [place] CV LAB;  Service: Cardiovascular;  Laterality: N/A;    Family History  Problem Relation Age of Onset  . Stroke Mother   . Hypertension Mother   . Cancer Father   . Heart attack Sister   . Diabetes Sister   . Heart attack Brother     Social History:  reports that she has never smoked. She has never used smokeless tobacco. She reports that she does not drink alcohol or use drugs.  She denies any exposure to radiation or toxins.  She is retired.  She worked at [employer].  She lives in [place].  Patient's contact number is [phone].  Daughter's number is [phone] [name]).  The patient is accompanied by her daughter today.   Allergies:  Allergies  Allergen Reactions  . Gabapentin Swelling  . Lipitor [Atorvastatin] Other (See Comments)    Muscle aches  . Mevacor [Lovastatin] Other (See Comments)    Muscle aches  . Milk-Related Compounds     Large quantities cause headaches   . Septra [Sulfamethoxazole-Trimethoprim]   . Statins Other (See Comments)    Muscle pain  . Sulfur Diarrhea and Nausea And Vomiting  . Zocor [Simvastatin] Other (See Comments)    Muscle aches    Current Medications: Current Outpatient Medications  Medication Sig Dispense Refill  .  acetaminophen (TYLENOL) 160 MG/5ML solution Place 20.3 mLs (650 mg total) into feeding tube every 6 (six) hours as needed for mild pain. 120 mL 0  . allopurinol (ZYLOPRIM) 20 mg/mL SUSP Place 5 mLs (100 mg total) into feeding tube at bedtime. 75 mL 1  . Amino Acids-Protein Hydrolys (FEEDING SUPPLEMENT, PRO-STAT SUGAR FREE 64,) LIQD Place 30 mLs into feeding tube daily. 450 mL 1  . clopidogrel (PLAVIX) 75 MG tablet Place 1 tablet (75 mg total) into feeding tube daily. 30 tablet 1  . glipiZIDE (GLUCOTROL) 5 MG tablet Place 1 tablet (5 mg total) into feeding tube daily before breakfast.    . hydrALAZINE (APRESOLINE) 50 MG tablet Place 1 tablet (50 mg total) into feeding tube 2 (two) times daily.    . Hypromellose (GENTEAL MILD) 0.2 % SOLN Place 1 drop into both eyes 3 (three) times daily.    Marland Kitchen lisinopril (PRINIVIL,ZESTRIL) 10 MG tablet Place 1 tablet (10 mg total) into feeding tube daily. 30 tablet 2  . Multiple Vitamin (MULTIVITAMIN) LIQD Place 15  mLs into feeding tube daily. 450 mL 1  . Nutritional Supplements (FEEDING SUPPLEMENT, JEVITY 1.5 CAL/FIBER,) LIQD Place 237 mLs into feeding tube 4 (four) times daily. 237 mL 90  . pantoprazole sodium (PROTONIX) 40 mg/20 mL PACK Place 20 mLs (40 mg total) into feeding tube 2 (two) times daily. 30 each 1  . rOPINIRole (REQUIP) 2 MG tablet Place 1 tablet (2 mg total) into feeding tube 2 (two) times daily. 60 tablet 1  . vitamin C (VITAMIN C) 250 MG tablet Place 1 tablet (250 mg total) into feeding tube 2 (two) times daily. 60 tablet 1  . Water For Irrigation, Sterile (FREE WATER) SOLN Place 30 mLs into feeding tube every 4 (four) hours.    . hydrochlorothiazide (HYDRODIURIL) 25 MG tablet Take 25 mg by mouth daily.  1  . lidocaine (LIDODERM) 5 % Place 1 patch onto the skin daily. Remove & Discard patch within 12 hours or as directed by MD    . meclizine (ANTIVERT) 25 MG tablet Take 25 mg by mouth 3 (three) times daily as needed for dizziness or nausea.    .  metoprolol tartrate (LOPRESSOR) 25 MG tablet Take 12.5 mg by mouth daily.  1   No current facility-administered medications for this visit.     Review of Systems:  GENERAL:  Tired.  Doing "better".  No fevers, sweats.  Weight down 2 pounds. PERFORMANCE STATUS (ECOG):  2 HEENT:  Vision issues (chronic).  No runny nose, sore throat, mouth sores or tenderness. Lungs: No shortness of breath or cough.  No hemoptysis. Cardiac:  No chest pain, palpitations, orthopnea, or PND. GI:  Dysphagia.  Takes little by mouth.  No nausea, vomiting, diarrhea, constipation, melena or hematochezia. GU:  No urgency, frequency, dysuria, or hematuria. Musculoskeletal:  No back pain.  Chronic arthritis pain.  No muscle tenderness. Extremities:  No pain or swelling. Skin:  Dry skin.  No rashes or skin changes. Neuro:  No headache, numbness or weakness, balance or coordination issues. Endocrine:  No diabetes.  Thyroid disease on Synthroid.  No hot flashes or night sweats. Psych:  No mood changes, depression or anxiety. Pain:  No focal pain. Review of systems:  All other systems reviewed and found to be negative.   Physical Exam: Blood pressure (!) 154/69, pulse 66, temperature 97.9 F (36.6 C), temperature source Tympanic, resp. rate 18, weight 114 lb 6 oz (51.9 kg). GENERAL:  Thin elderly woman sitting comfortably in a wheelchair in the exam room in no acute distress. MENTAL STATUS:  Alert and oriented to person, place and time. HEAD:  Short gray hair.  Normocephalic, atraumatic, face symmetric, no Cushingoid features. EYES:  Glasses.  Pupils equal round and reactive to light and accomodation.  No conjunctivitis or scleral icterus. ENT:  Oropharynx clear without lesion.  Tongue normal. Mucous membranes moist.  RESPIRATORY:  Clear to auscultation without rales, wheezes or rhonchi. CARDIOVASCULAR:  Regular rate and rhythm without murmur, rub or gallop. ABDOMEN:  Soft, non-tender, with active bowel sounds, and no  hepatosplenomegaly.  No masses. SKIN:  No rashes, ulcers or lesions. EXTREMITIES: Trace lower extremity edema.  No skin discoloration or tenderness.  No palpable cords. LYMPH NODES: No palpable cervical, supraclavicular, axillary or inguinal adenopathy  NEUROLOGICAL: Unremarkable. PSYCH:  Appropriate.    No visits with results within 3 Day(s) from this visit.  Latest known visit with results is:  Orders Only on 08/18/2018  Component Date Value Ref Range Status  . Ferritin 08/18/2018 36  11 - 307 ng/mL Final   Performed at [facility], [address] [place], [place] [address]  . WBC 08/18/2018 5.7  4.0 - 10.5 K/uL Final  . RBC 08/18/2018 3.23* 3.87 - 5.11 MIL/uL Final  . Hemoglobin 08/18/2018 8.9* 12.0 - 15.0 g/dL Final  . HCT 08/18/2018 28.9* 36.0 - 46.0 % Final  . MCV 08/18/2018 89.5  80.0 - 100.0 fL Final  . MCH 08/18/2018 27.6  26.0 - 34.0 pg Final  . MCHC 08/18/2018 30.8  30.0 - 36.0 g/dL Final  . RDW 08/18/2018 14.0  11.5 - 15.5 % Final  . Platelets 08/18/2018 308  150 - 400 K/uL Final  . nRBC 08/18/2018 0.0  0.0 - 0.2 % Final  . Neutrophils Relative % 08/18/2018 66  % Final  . Neutro Abs 08/18/2018 3.8  1.7 - 7.7 K/uL Final  . Lymphocytes Relative 08/18/2018 17  % Final  . Lymphs Abs 08/18/2018 1.0  0.7 - 4.0 K/uL Final  . Monocytes Relative 08/18/2018 9  % Final  . Monocytes Absolute 08/18/2018 0.5  0.1 - 1.0 K/uL Final  . Eosinophils Relative 08/18/2018 7  % Final  . Eosinophils Absolute 08/18/2018 0.4  0.0 - 0.5 K/uL Final  . Basophils Relative 08/18/2018 1  % Final  . Basophils Absolute 08/18/2018 0.1  0.0 - 0.1 K/uL Final  . Immature Granulocytes 08/18/2018 0  % Final  . Abs Immature Granulocytes 08/18/2018 0.02  0.00 - 0.07 K/uL Final   Performed at [facility], [address] [place], [place] [address]    Assessment:  HALLEY SHEPHEARD Day is a 84 y.o. female with iron deficiency anemia.  She has been on oral iron x 1 year with continued decline in  her counts.  She has dysphagia and odynophagia.  She is on  PPI and carafate.  Diet is modest.  She denies pica.  Abdomen and pelvic CT on 02/19/2017 revealed no acute abnormality.  She has mild sigmoid diverticulosis.  CBC on 02/26/2017 revealed a hematocrit of 25.3, hemoglobin 8.0, and MCV 83.2.  Hemoccult studies x 1 were positive in 11/19/2016 and negative x 2 in 01/2017.  Ferritin was 5 with an iron saturation of 4%, and a TIBC of 487 on 01/30/2017.  Normal studies included:  creatinine (1.0), calcium, albumen, B12 (647), and TSH.  Folic acid was 69.6 on 08/09/2015.  Urinalysis revealed no blood on 01/23/2017.  She received Venofer weekly x 3 (02/28/2017 - 03/14/2017), x 2 (04/19/2017 - 04/26/2017), and x 2 (06/14/2017 and 06/21/2017).   Ferritin has been followed: 8 on 02/28/2017, 164 on 03/19/2017, 27 on 04/18/2017, 82 on 05/17/2017, 38 on 06/10/2017, 100 on 07/18/2017, 40 on 09/18/2017, 9 on 11/19/2017, 13 on 01/02/2018, 57 on 02/19/2018, 72 on 04/04/2018, 41 on 05/21/2018, and 36 on 08/18/2018.  EGD in 2016 revealed gastric ulcers and esophageal stricture which was dilated.  Barium swallow on 11/15/2015 was normal.  She has a history of mesenteric ischemia.  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  She was on Coumadin, and now on Plavix.  Colonoscopy in 2008 revealed diverticulosis.   EGD on 03/28/2017 revealed a moderate-sized area of extrinsic compression was found in the mid esophagus from about 25-28 cm, no apparent defect or abnormality in the mucosa or esophageal wall. There was a 2 cm cratered esophageal ulcer.  There was segmental moderate mucosal changes characterized by granularity at the gastroesophageal junction.  There was localized mild inflammation characterized by congestion (edema),  depression and erythema was found on the greater curvature of the gastric body.  Biopsies revealed no H pylori, dysplasia or malignancy.  She is on pantoprazole and Carafate.  She has  esophageal spasms improved with Bentyl.  EGD on 06/20/2017 revealed a non-bleeding esophageal ulcer, gastritis, and a normal duodenum.  Pathology revealed mild reactive gastropathy.  Colonoscopy on 06/20/2017 revealed non-bleeding external hemorrhoids, diverticulosis in the sigmoid colon, and one 1 mm polyp at the recto-sigmoid colon.  Pathology revealed a tubular adenoma  Negative for high grade dysplasia or malignancy.  Upper EUS on 12/05/2017 revealed esophageal stenosis.  Biopsy revealed CMV viral cytopathic effect.  There was no evidence of malignancy.  The EUS scope could not be advanced into the stenosis as it was too tight.  There was  loss of normal wall layers and diffusely hypoechoic measuring from 3 mm to 6 mm in thickness.   She was treated with valacyclovir x 28 days.  CMV DNA quantitative by PCR was positive on 01/02/2018 and < 200 IU/ml on 01/30/2018.  Upper GI endoscopy on 04/15/2018 revealed extrinsic compression in the mid esophagus.  Esophageal biopsy revealed detached stratified squamous epithelium with focal reactive parakeratosis.  There was no dysplasia or malignancy.  There was a benign appearing esophageal stenosis.  She underwent EGD with esophageal dilatation on 05/01/2018 and 05/19/2018 at [facility].  Chest CT on 05/28/2018 revealed distal esophageal stricture at the GE junction with associated mild submucosal edema, possibly reflecting a post infectious/inflammatory stricture, underlying neoplasm not entirely excluded.  There was no extrinsic compression.  She underwent PEG tube placement on 06/11/2018.  She takes little by mouth.  Symptomatically, she is doing "better", but feels tired.  Exam is stable.  Plan: 1.   Review labs from 08/18/2018.  Possible iron deficiency. 2.   Iron deficiency anemia:             Hemoglobin is 8.9.  Ferritin is 36 (adequate).             Possible iron deficiency as hemoglobin remains low and ferritin is borderline.  Check sed rate at  next visit as may distort accuracy of ferritin.  Venofer today and on 09/01/2018. 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 4.  Weight loss:             Continue G tube feeds 5.  RTC in 1 month for labs (CBC with diff, ferritin). 6.  RTC in 3 months for MD assessment, labs (CBC with diff, ferritin - day before), and +/- Venofer.    Lequita Asal, MD  08/22/2018, 1:40 PM

## 2018-08-22 NOTE — Progress Notes (Signed)
Patient has had feeding tube placed since last visit. States she has a lot of gas.  States she is doing much better. Accompanied by her daughter today.

## 2018-08-25 ENCOUNTER — Telehealth: Payer: Self-pay

## 2018-08-25 NOTE — Telephone Encounter (Signed)
Nutrition  Called patient for nutrition follow-up regarding tube feeding.    Patient reports that she has been able to get all 4 cartons of jevity 1.5 in over 5 feedings (6am medications (79ml water), promod (28ml water) and 1/2 first carton, 72ml water before and after feeding) 10:00am water flush 22ml and rest of first carton of tube feeding, 19ml water flush after 12:30 water flush 28ml and 2nd carton of tube feeding 40ml water 4:30 pm 61ml of water and 3rd carton of tube feeding and 50ml water. Gives about 35ml water with medications as well.  9pm 58ml water and 4th carton of tube feeding and 70ml of water  Reports still having gas but has not started simethicone.  Reports gas maybe slightly better.   Reports that she is urinating without difficulty.    Intervention:  Recommend patient continue to use 4 cartons of jevity 1.5 for adequate calories and protein plus 43ml of promod.   Patient receiving about 1249ml water from formula and water flushes.   Encouraged patient to start taking simethicone to help with gas per NP instructions.    Next visit:  December 23 after labs  [place]. Zenia Resides, [place], [place] Registered Dietitian [phone] (pager)

## 2018-08-26 ENCOUNTER — Other Ambulatory Visit: Payer: Self-pay | Admitting: Urgent Care

## 2018-08-26 ENCOUNTER — Other Ambulatory Visit: Payer: Self-pay | Admitting: Hematology and Oncology

## 2018-09-01 ENCOUNTER — Inpatient Hospital Stay: Payer: Medicare Other | Attending: Hematology and Oncology

## 2018-09-01 DIAGNOSIS — D509 Iron deficiency anemia, unspecified: Secondary | ICD-10-CM | POA: Insufficient documentation

## 2018-09-01 MED ORDER — SODIUM CHLORIDE 0.9 % IV SOLN
Freq: Once | INTRAVENOUS | Status: AC
  Administered 2018-09-01: 14:00:00 via INTRAVENOUS
  Filled 2018-09-01: qty 250

## 2018-09-01 MED ORDER — SODIUM CHLORIDE 0.9 % IV SOLN: 200.0000 mg | INTRAVENOUS | Status: DC

## 2018-09-01 MED ORDER — IRON SUCROSE 20 MG/ML IV SOLN
200.0000 mg | Freq: Once | INTRAVENOUS | Status: AC
  Administered 2018-09-01: 200 mg via INTRAVENOUS
  Filled 2018-09-01: qty 10

## 2018-09-08 ENCOUNTER — Ambulatory Visit: Payer: Self-pay

## 2018-09-08 ENCOUNTER — Telehealth (INDEPENDENT_AMBULATORY_CARE_PROVIDER_SITE_OTHER): Payer: Self-pay | Admitting: Vascular Surgery

## 2018-09-12 ENCOUNTER — Other Ambulatory Visit (INDEPENDENT_AMBULATORY_CARE_PROVIDER_SITE_OTHER): Payer: Self-pay

## 2018-09-12 ENCOUNTER — Telehealth (INDEPENDENT_AMBULATORY_CARE_PROVIDER_SITE_OTHER): Payer: Self-pay | Admitting: Vascular Surgery

## 2018-09-12 DIAGNOSIS — I739 Peripheral vascular disease, unspecified: Secondary | ICD-10-CM

## 2018-09-12 MED ORDER — CLOPIDOGREL BISULFATE 75 MG PO TABS: 75.0000 mg | ORAL_TABLET | Freq: Every day | ORAL | 1 refills | Status: DC

## 2018-09-12 NOTE — Telephone Encounter (Signed)
Sent in a 90 day supply of the medication for the patient to the pharmacy that was on file for her in [place]. I called the patient to let her know of the order.

## 2018-09-19 ENCOUNTER — Other Ambulatory Visit (INDEPENDENT_AMBULATORY_CARE_PROVIDER_SITE_OTHER): Payer: Self-pay | Admitting: Vascular Surgery

## 2018-09-19 DIAGNOSIS — I739 Peripheral vascular disease, unspecified: Secondary | ICD-10-CM

## 2018-09-22 ENCOUNTER — Other Ambulatory Visit: Payer: Self-pay

## 2018-09-22 ENCOUNTER — Inpatient Hospital Stay: Payer: Medicare Other

## 2018-09-22 ENCOUNTER — Other Ambulatory Visit: Payer: Self-pay | Admitting: Urgent Care

## 2018-09-22 DIAGNOSIS — D509 Iron deficiency anemia, unspecified: Secondary | ICD-10-CM | POA: Diagnosis not present

## 2018-09-22 DIAGNOSIS — D508 Other iron deficiency anemias: Secondary | ICD-10-CM

## 2018-09-22 LAB — CBC WITH DIFFERENTIAL/PLATELET
Abs Immature Granulocytes: 0.01 10*3/uL (ref 0.00–0.07)
Basophils Absolute: 0 10*3/uL (ref 0.0–0.1)
Basophils Relative: 1 %
Eosinophils Absolute: 0.2 10*3/uL (ref 0.0–0.5)
Eosinophils Relative: 4 %
HCT: 29.7 % — ABNORMAL LOW (ref 36.0–46.0)
Hemoglobin: 9.3 g/dL — ABNORMAL LOW (ref 12.0–15.0)
Immature Granulocytes: 0 %
Lymphocytes Relative: 20 %
Lymphs Abs: 1.1 10*3/uL (ref 0.7–4.0)
MCH: 27.5 pg (ref 26.0–34.0)
MCHC: 31.3 g/dL (ref 30.0–36.0)
MCV: 87.9 fL (ref 80.0–100.0)
Monocytes Absolute: 0.3 10*3/uL (ref 0.1–1.0)
Monocytes Relative: 6 %
Neutro Abs: 3.8 10*3/uL (ref 1.7–7.7)
Neutrophils Relative %: 69 %
Platelets: 279 10*3/uL (ref 150–400)
RBC: 3.38 MIL/uL — ABNORMAL LOW (ref 3.87–5.11)
RDW: 15.1 % (ref 11.5–15.5)
WBC: 5.4 10*3/uL (ref 4.0–10.5)
nRBC: 0 % (ref 0.0–0.2)

## 2018-09-22 LAB — FERRITIN: Ferritin: 80 ng/mL (ref 11–307)

## 2018-09-22 MED ORDER — SIMETHICONE 40 MG/0.6ML PO SUSP: 80.0000 mg | Freq: Four times a day (QID) | ORAL | 0 refills | Status: DC | PRN

## 2018-09-22 NOTE — Progress Notes (Signed)
Nutrition Follow-up:  Patient followed by Dr. Mike Gip for iron deficency anemia.  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  PEG placed on 9/11.   Met with patient and daughter [name] today in clinic.  Patient reports that she has been getting in all 4 cartons of jevity 1.5 daily and 23m of promod daily.  Reports gas and bloating has not been taking simethacone.  Daughter reports message was given to other daughter and she has a lot going on.  Patient reports 2 loose stool daily sometimes 3, not watery, improved overall.    Patient not taking food orally.    Medications: reviewed  Labs: reviewed  Anthropometrics:   Weight measured today of 116 lb7 oz (was wearing coat) increased from 114 lb 7 oz on 11/18.     Estimated Energy Needs  Kcals: 1275-1530 calories/d Protein: 64-77 g/d Fluid: 1.5 L/d  NUTRITION DIAGNOSIS: Inadequate oral intake related to stricture    INTERVENTION:  Discussed simethicone with BGaspar Bidding NP and will write script and send into patient's pharmacy.  Daughter and patient notified and they will stop by pharmacy on way home.  Recommend patient to continue 4 cartons of jevity 1.5 daily and promod 38mdaily.   Tube feeding to provide 1520 calories, 70 g of protein and 123452mree water (flushes, formula).   Discussed with patient option of trying different formula as well.  Patient not interested in trying different formula at this time.  Patient has refused to try tube feeding regimen of 1/2 osmolite 1.5 and jevity 1.5 in the past.      MONITORING, EVALUATION, GOAL: Patient will continue to consume adequate calories and protein via tube to maintain weight   NEXT VISIT: Daughter to check to see if labs can be drawn in [place] see Dr CorMike Gip MebWendoverRD to plan follow-up once here back from daughter.   [name] B. AllZenia ResidesD,[place]istered Dietitian [phone])

## 2018-10-27 ENCOUNTER — Telehealth: Payer: Self-pay

## 2018-10-27 ENCOUNTER — Encounter: Payer: Self-pay | Admitting: Emergency Medicine

## 2018-10-27 ENCOUNTER — Encounter: Payer: Self-pay | Admitting: *Deleted

## 2018-10-27 ENCOUNTER — Other Ambulatory Visit: Payer: Self-pay

## 2018-10-27 DIAGNOSIS — Z79899 Other long term (current) drug therapy: Secondary | ICD-10-CM | POA: Diagnosis not present

## 2018-10-27 DIAGNOSIS — R109 Unspecified abdominal pain: Secondary | ICD-10-CM | POA: Diagnosis not present

## 2018-10-27 DIAGNOSIS — R110 Nausea: Secondary | ICD-10-CM | POA: Diagnosis not present

## 2018-10-27 DIAGNOSIS — Z85828 Personal history of other malignant neoplasm of skin: Secondary | ICD-10-CM | POA: Insufficient documentation

## 2018-10-27 DIAGNOSIS — E119 Type 2 diabetes mellitus without complications: Secondary | ICD-10-CM | POA: Insufficient documentation

## 2018-10-27 DIAGNOSIS — I10 Essential (primary) hypertension: Secondary | ICD-10-CM | POA: Insufficient documentation

## 2018-10-27 DIAGNOSIS — R197 Diarrhea, unspecified: Secondary | ICD-10-CM | POA: Diagnosis not present

## 2018-10-27 DIAGNOSIS — Z7984 Long term (current) use of oral hypoglycemic drugs: Secondary | ICD-10-CM | POA: Diagnosis not present

## 2018-10-27 LAB — COMPREHENSIVE METABOLIC PANEL
ALT: 13 U/L (ref 0–44)
AST: 18 U/L (ref 15–41)
Albumin: 3.5 g/dL (ref 3.5–5.0)
Alkaline Phosphatase: 77 U/L (ref 38–126)
Anion gap: 7 (ref 5–15)
BUN: 30 mg/dL — ABNORMAL HIGH (ref 8–23)
CO2: 29 mmol/L (ref 22–32)
Calcium: 8.9 mg/dL (ref 8.9–10.3)
Chloride: 98 mmol/L (ref 98–111)
Creatinine, Ser: 0.76 mg/dL (ref 0.44–1.00)
GFR calc Af Amer: >60 mL/min (ref >60)
GFR calc non Af Amer: >60 mL/min (ref >60)
Glucose, Bld: 225 mg/dL — ABNORMAL HIGH (ref 70–99)
Potassium: 3.8 mmol/L (ref 3.5–5.1)
Sodium: 134 mmol/L — ABNORMAL LOW (ref 135–145)
Total Bilirubin: 0.4 mg/dL (ref 0.3–1.2)
Total Protein: 6.7 g/dL (ref 6.5–8.1)

## 2018-10-27 LAB — CBC WITH DIFFERENTIAL/PLATELET
Abs Immature Granulocytes: 0.05 10*3/uL (ref 0.00–0.07)
Basophils Absolute: 0 10*3/uL (ref 0.0–0.1)
Basophils Relative: 0 %
Eosinophils Absolute: 0.7 10*3/uL — ABNORMAL HIGH (ref 0.0–0.5)
Eosinophils Relative: 6 %
HCT: 33 % — ABNORMAL LOW (ref 36.0–46.0)
Hemoglobin: 10.5 g/dL — ABNORMAL LOW (ref 12.0–15.0)
Immature Granulocytes: 0 %
Lymphocytes Relative: 6 %
Lymphs Abs: 0.7 10*3/uL (ref 0.7–4.0)
MCH: 28.2 pg (ref 26.0–34.0)
MCHC: 31.8 g/dL (ref 30.0–36.0)
MCV: 88.5 fL (ref 80.0–100.0)
Monocytes Absolute: 0.3 10*3/uL (ref 0.1–1.0)
Monocytes Relative: 2 %
Neutro Abs: 9.4 10*3/uL — ABNORMAL HIGH (ref 1.7–7.7)
Neutrophils Relative %: 86 %
Platelets: 319 10*3/uL (ref 150–400)
RBC: 3.73 MIL/uL — ABNORMAL LOW (ref 3.87–5.11)
RDW: 15.2 % (ref 11.5–15.5)
WBC: 11.2 10*3/uL — ABNORMAL HIGH (ref 4.0–10.5)
nRBC: 0 % (ref 0.0–0.2)

## 2018-10-27 LAB — LIPASE, BLOOD: Lipase: 24 U/L (ref 11–51)

## 2018-10-27 NOTE — ED Triage Notes (Signed)
Pt to triage via w/c with no distress noted; pt reports with each tube feeding, having diarrhea x 2 days with lower abd pain

## 2018-10-27 NOTE — Telephone Encounter (Signed)
Nutrition Follow-up:  Patient followed by Dr. Mike Gip for iron deficiency anemia.  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  PEG placed on 9/11.    Patient called and left message for RD to return call.  RD called patient back and spoke with patient. Patient reports increase in diarrhea over yesterday and today. Reports last night had diarrhea in the bed and didn't realize it.  Reports today has been having diarrhea after every bolus tube feeding.  Reports no change in medications or having been on antibiotics recently.  Last time met with RD (12/23) reported 2 sometimes 3 loose stools, not watery improved overall.    Reports that she has only taking 1 imodium today.  Patient reports that she has called Christiane, NP for [facility] and will be sending stool sample to be tested.   Medications: no changes  Labs: reviewed from 12/23  Anthropometrics:   No new weight   Estimated Energy Needs  Kcals: 1275-1530 calories Protein: 64-77 g Fluid: 1.5 L/d  NUTRITION DIAGNOSIS: Inadequate oral intake related to stricture   INTERVENTION:  Encouraged patient to call Christiane, NP for GI back for further guidance on taking imodium if diarrhea not controlled.  Discussed option for patient to be seen in [facility], denied at this time. Patient may benefit from trying gravity bags during this time of diarrhea vs bolus feeding.  RD has talked with RD at [facility] and will contact to discuss and can send out bags if patient agrees.  Also if diarrhea continues may benefit from adjustments in tube feeding formula (ensure plus??) although was previously tolerating. Patient confirms formula is in date.   Will send message to Dr Mike Gip.   MONITORING, EVALUATION, GOAL: weight trends, intake   NEXT VISIT:  Phone f/u Jan 30th  Brandom Kerwin B. [name], [place], [place] Registered Dietitian [phone] (pager)

## 2018-10-28 ENCOUNTER — Encounter: Payer: Self-pay | Admitting: Emergency Medicine

## 2018-10-28 ENCOUNTER — Emergency Department: Payer: Medicare Other

## 2018-10-28 ENCOUNTER — Other Ambulatory Visit
Admission: RE | Admit: 2018-10-28 | Discharge: 2018-10-28 | Disposition: A | Discharge status: Home / Self Care | Payer: Medicare Other | Source: Ambulatory Visit | Attending: Emergency Medicine | Admitting: Emergency Medicine

## 2018-10-28 ENCOUNTER — Emergency Department
Admission: EM | Admit: 2018-10-28 | Discharge: 2018-10-28 | Disposition: A | Discharge status: Home / Self Care | Payer: Medicare Other | Attending: Emergency Medicine | Admitting: Emergency Medicine

## 2018-10-28 DIAGNOSIS — R197 Diarrhea, unspecified: Secondary | ICD-10-CM

## 2018-10-28 DIAGNOSIS — R109 Unspecified abdominal pain: Secondary | ICD-10-CM

## 2018-10-28 HISTORY — DX: Esophageal obstruction: K22.2

## 2018-10-28 LAB — GASTROINTESTINAL PANEL BY PCR, STOOL (REPLACES STOOL CULTURE)

## 2018-10-28 LAB — URINALYSIS, COMPLETE (UACMP) WITH MICROSCOPIC
Bilirubin Urine: NEGATIVE
Glucose, UA: NEGATIVE mg/dL
Hgb urine dipstick: NEGATIVE
Ketones, ur: NEGATIVE mg/dL
Nitrite: NEGATIVE
Protein, ur: NEGATIVE mg/dL
Specific Gravity, Urine: 1.014 (ref 1.005–1.030)
pH: 7 (ref 5.0–8.0)

## 2018-10-28 MED ORDER — SODIUM CHLORIDE 0.9 % IV BOLUS
500.0000 mL | Freq: Once | INTRAVENOUS | Status: AC
  Administered 2018-10-28: 500 mL via INTRAVENOUS

## 2018-10-28 MED ORDER — IOPAMIDOL (ISOVUE-300) INJECTION 61%
30.0000 mL | Freq: Once | INTRAVENOUS | Status: AC | PRN
  Administered 2018-10-28: 30 mL via ORAL

## 2018-10-28 MED ORDER — IOPAMIDOL (ISOVUE-300) INJECTION 61%
75.0000 mL | Freq: Once | INTRAVENOUS | Status: AC | PRN
  Administered 2018-10-28: 100 mL via INTRAVENOUS

## 2018-10-28 MED ORDER — ROPINIROLE HCL 1 MG PO TABS
2.0000 mg | ORAL_TABLET | Freq: Once | ORAL | Status: AC
  Administered 2018-10-28: 2 mg via ORAL
  Filled 2018-10-28: qty 2

## 2018-10-28 NOTE — Discharge Instructions (Signed)
Your workup in the Emergency Department today was reassuring.  We did not find any specific abnormalities, and you were not able to provide a stool specimen for [place] to check for possible causes of infection.  We recommend you drink plenty of fluids, take your regular medications and/or any new ones prescribed today, and follow up with the doctor(s) listed in these documents as recommended.  Return to the Emergency Department if you develop new or worsening symptoms that concern you.

## 2018-10-28 NOTE — ED Provider Notes (Signed)
[facility] Emergency Department Provider Note  ____________________________________________   First MD Initiated Contact with Patient 10/28/18 0101     (approximate)  I have reviewed the triage vital signs and the nursing notes.   HISTORY  Chief Complaint Diarrhea    HPI [name] Day is a 85 y.o. female with extensive medical history as listed below which notably includes gastrostomy tube placement about 4 to 5 months ago secondary to severe esophageal stricture.  She presents by private vehicle for evaluation of persistent severe diarrhea for the last 2 days.  She reports that started 2 days ago and resulted in as many as 6 or 7 loose, extremely watery bowel movements daily.  She says as soon as she puts anything in her feeding tube it comes right back out again within about an hour.  She has had abdominal cramping that is intermittent and severe at times and nausea but no vomiting.  The symptoms improved yesterday after she took some Imodium but they were bad again earlier today.  She took another dose of Imodium and she has not had a bowel movement for a few hours.  She and her family followed up with her primary care doctor who was concerned that she might need stool studies.  Everyone was also concerned that she may be dehydrated.  She currently feels better and is not having any cramping.  She denies fever/chills, chest pain, shortness of breath, cough, body aches, viral illness, dysuria.  Taking any third of food or fluid by her G-tube make the symptoms worse and taking the Imodium seems to have made it better..    Past Medical History:  Diagnosis Date  . Anemia    IRON INFUSIONS  . Aortic valve sclerosis   . Arrhythmia   . Arthritis    osteoarthritis  . Basal cell carcinoma of skin   . Brain tumor ([place])   . Brain tumor ([place])   . Cervical spine disease   . Diabetes mellitus without complication ([place])   . Dysrhythmia    sinus arrhythmia  .  Esophageal stricture    severe, led to feeding tube placement in Sept 2019  . Esophageal ulcer without bleeding   . Gastrostomy tube dependent ([place])    DOES NOT EAT OR DRINK   . GERD (gastroesophageal reflux disease)   . History of kidney stones   . Hyperlipemia   . Hypertension   . Kidney stones   . Leaky heart valve   . Meningioma ([place])   . 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 Pulmonary hypertension ([place])   . RLS (restless legs syndrome)   . Vertigo     Patient Active Problem List   Diagnosis Date Noted  . Hyponatremia 06/23/2018  . Protein-calorie malnutrition, severe 06/12/2018  . Esophageal stricture 06/11/2018  . Weight loss 05/29/2018  . Dysphagia 05/29/2018  . Esophageal stenosis 05/29/2018  . Hyperlipemia 04/04/2018  . Itching 01/08/2018  . Medication monitoring encounter 01/08/2018  . Paronychia and onychomycosis great toe, right  01/08/2018  . Esophagitis, CMV ([place]) 01/01/2018  . Hypertension 04/30/2017  . Mesenteric ischemia ([place]) 03/01/2017  . Type 2 diabetes mellitus with complication ([place]) 79/89/2119  . Iron deficiency anemia 02/28/2017  . PAD (peripheral artery disease) ([place]) 02/05/2017    Past Surgical History:  Procedure Laterality Date  . ABDOMINAL HYSTERECTOMY    . APPENDECTOMY    . ARTHROGRAM KNEE Left   . BACK SURGERY     CERVIVAL NECK  FUSION  . COLONOSCOPY WITH PROPOFOL N/A 06/20/2017   Procedure: COLONOSCOPY WITH PROPOFOL;  Surgeon: Lollie Sails, MD;  Location: [facility] ENDOSCOPY;  Service: Endoscopy;  Laterality: N/A;  . ESOPHAGOGASTRODUODENOSCOPY (EGD) WITH PROPOFOL N/A 03/14/2015   Procedure: ESOPHAGOGASTRODUODENOSCOPY (EGD) WITH PROPOFOL;  Surgeon: Josefine Class, MD;  Location: [facility] ENDOSCOPY;  Service: Endoscopy;  Laterality: N/A;  . ESOPHAGOGASTRODUODENOSCOPY (EGD) WITH PROPOFOL N/A 03/28/2017   Procedure: ESOPHAGOGASTRODUODENOSCOPY (EGD) WITH PROPOFOL;  Surgeon: Lollie Sails, MD;  Location: [facility] ENDOSCOPY;  Service:  Endoscopy;  Laterality: N/A;  . ESOPHAGOGASTRODUODENOSCOPY (EGD) WITH PROPOFOL N/A 06/20/2017   Procedure: ESOPHAGOGASTRODUODENOSCOPY (EGD) WITH PROPOFOL;  Surgeon: Lollie Sails, MD;  Location: [facility] ENDOSCOPY;  Service: Endoscopy;  Laterality: N/A;  . ESOPHAGOGASTRODUODENOSCOPY (EGD) WITH PROPOFOL N/A 10/21/2017   Procedure: ESOPHAGOGASTRODUODENOSCOPY (EGD) WITH PROPOFOL;  Surgeon: Lollie Sails, MD;  Location: [facility] ENDOSCOPY;  Service: Endoscopy;  Laterality: N/A;  . ESOPHAGOGASTRODUODENOSCOPY (EGD) WITH PROPOFOL N/A 04/15/2018   Procedure: ESOPHAGOGASTRODUODENOSCOPY (EGD) WITH PROPOFOL;  Surgeon: Lollie Sails, MD;  Location: [facility] ENDOSCOPY;  Service: Endoscopy;  Laterality: N/A;  . ESOPHAGUS SURGERY     CLOSURE  . EYE SURGERY    . HYSTERECTOMY ABDOMINAL WITH SALPINGECTOMY    . IR GASTROSTOMY TUBE MOD SED  06/11/2018  . PERIPHERAL VASCULAR CATHETERIZATION N/A 01/23/2016   Procedure: Visceral Venography;  Surgeon: Algernon Huxley, MD;  Location: [place] CV LAB;  Service: Cardiovascular;  Laterality: N/A;  . PERIPHERAL VASCULAR CATHETERIZATION  01/23/2016   Procedure: Peripheral Vascular Intervention;  Surgeon: Algernon Huxley, MD;  Location: [place] CV LAB;  Service: Cardiovascular;;  . UPPER ESOPHAGEAL ENDOSCOPIC ULTRASOUND (EUS) N/A 12/05/2017   Procedure: UPPER ESOPHAGEAL ENDOSCOPIC ULTRASOUND (EUS);  Surgeon: Jola Schmidt, MD;  Location: [facility] ENDOSCOPY;  Service: Endoscopy;  Laterality: N/A;  . VISCERAL ANGIOGRAPHY N/A 03/18/2017   Procedure: Visceral Angiography;  Surgeon: Algernon Huxley, MD;  Location: [place] CV LAB;  Service: Cardiovascular;  Laterality: N/A;  . VISCERAL ARTERY INTERVENTION N/A 03/18/2017   Procedure: Visceral Artery Intervention;  Surgeon: Algernon Huxley, MD;  Location: [place] CV LAB;  Service: Cardiovascular;  Laterality: N/A;    Prior to Admission medications   Medication Sig Start Date End Date Taking? Authorizing Provider  acetaminophen  (TYLENOL) 160 MG/5ML solution Place 20.3 mLs (650 mg total) into feeding tube every 6 (six) hours as needed for mild pain. 06/13/18   Demetrios Loll, MD  allopurinol (ZYLOPRIM) 20 mg/mL SUSP Place 5 mLs (100 mg total) into feeding tube at bedtime. 06/13/18   Demetrios Loll, MD  Amino Acids-Protein Hydrolys (FEEDING SUPPLEMENT, PRO-STAT SUGAR FREE 64,) LIQD Place 30 mLs into feeding tube daily. 06/13/18   Demetrios Loll, MD  clopidogrel (PLAVIX) 75 MG tablet TAKE 1 TABLET BY MOUTH ONCE DAILY 10/07/18   Algernon Huxley, MD  glipiZIDE (GLUCOTROL) 5 MG tablet Place 1 tablet (5 mg total) into feeding tube daily before breakfast. 06/13/18   Demetrios Loll, MD  hydrALAZINE (APRESOLINE) 50 MG tablet Place 1 tablet (50 mg total) into feeding tube 2 (two) times daily. 06/13/18   Demetrios Loll, MD  hydrochlorothiazide (HYDRODIURIL) 25 MG tablet Take 25 mg by mouth daily. 07/21/18   [provider]  Hypromellose (GENTEAL MILD) 0.2 % SOLN Place 1 drop into both eyes 3 (three) times daily.    [provider]  lidocaine (LIDODERM) 5 % Place 1 patch onto the skin daily. Remove & Discard patch within 12 hours or as directed by MD  [provider]  lisinopril (PRINIVIL,ZESTRIL) 10 MG tablet Place 1 tablet (10 mg total) into feeding tube daily. 06/26/18   Dustin Flock, MD  meclizine (ANTIVERT) 25 MG tablet Take 25 mg by mouth 3 (three) times daily as needed for dizziness or nausea.    [provider]  metoprolol tartrate (LOPRESSOR) 25 MG tablet Take 12.5 mg by mouth daily. 07/08/18   [provider]  Multiple Vitamin (MULTIVITAMIN) LIQD Place 15 mLs into feeding tube daily. 06/13/18   Demetrios Loll, MD  Nutritional Supplements (FEEDING SUPPLEMENT, JEVITY 1.5 CAL/FIBER,) LIQD Place 237 mLs into feeding tube 4 (four) times daily. 06/25/18   Dustin Flock, MD  pantoprazole sodium (PROTONIX) 40 mg/20 mL PACK Place 20 mLs (40 mg total) into feeding tube 2 (two) times daily. 06/25/18   Dustin Flock, MD    rOPINIRole (REQUIP) 2 MG tablet Place 1 tablet (2 mg total) into feeding tube 2 (two) times daily. 06/13/18   Demetrios Loll, MD  simethicone [facility]) 40 IW/9.7LG drops Take 1.2 mLs (80 mg total) by mouth 4 (four) times daily as needed for flatulence. If unable to tolerate orally, may administer via PEG tube. 09/22/18   Karen Kitchens, NP  vitamin C (VITAMIN C) 250 MG tablet Place 1 tablet (250 mg total) into feeding tube 2 (two) times daily. 06/13/18   Demetrios Loll, MD  Water For Irrigation, Sterile (FREE WATER) SOLN Place 30 mLs into feeding tube every 4 (four) hours. 06/25/18   Dustin Flock, MD    Allergies Gabapentin; Lipitor [atorvastatin]; Mevacor [lovastatin]; Milk-related compounds; Septra [sulfamethoxazole-trimethoprim]; Statins; Sulfur; and Zocor [simvastatin]  Family History  Problem Relation Age of Onset  . Stroke Mother   . Hypertension Mother   . Cancer Father   . Heart attack Sister   . Diabetes Sister   . Heart attack Brother     Social History Social History   Tobacco Use  . Smoking status: Never Smoker  . Smokeless tobacco: Never Used  Substance Use Topics  . Alcohol use: Never    Frequency: Never  . Drug use: Never    Review of Systems Constitutional: No fever/chills Eyes: No visual changes. ENT: No sore throat. Cardiovascular: Denies chest pain. Respiratory: Denies shortness of breath. Gastrointestinal: Diarrhea for 2 days as described above with intermittent abdominal cramping. Genitourinary: Negative for dysuria. Musculoskeletal: Negative for neck pain.  Negative for back pain. Integumentary: Negative for rash. Neurological: Negative for headaches, focal weakness or numbness.   ____________________________________________   PHYSICAL EXAM:  VITAL SIGNS: ED Triage Vitals  Enc Vitals Group     BP 10/27/18 1935 (!) 168/51     Pulse Rate 10/27/18 1935 83     Resp 10/27/18 1935 18     Temp 10/27/18 1935 98.2 F (36.8 C)     Temp Source 10/27/18  1935 Oral     SpO2 10/27/18 1935 100 %     Weight 10/27/18 1935 52.6 kg (116 lb)     Height 10/27/18 1935 1.6 m (5\' 3" )     Head Circumference --      Peak Flow --      Pain Score 10/27/18 1934 6     Pain Loc --      Pain Edu? --      Excl. in [place]? --     Constitutional: Alert and oriented.  Elderly but generally well-appearing and in no distress at this time. Eyes: Conjunctivae are normal.  Head: Atraumatic. Nose: No congestion/rhinnorhea. Mouth/Throat: Mucous membranes  are moist. Neck: No stridor.  No meningeal signs.   Cardiovascular: Normal rate, regular rhythm. Good peripheral circulation. Grossly normal heart sounds. Respiratory: Normal respiratory effort.  No retractions. Lungs CTAB. Gastrointestinal: Soft and nondistended.  G-tube in place.  Mild tenderness to palpation throughout the abdomen but not peritoneal and no rebound and no guarding. Musculoskeletal: No lower extremity tenderness nor edema. No gross deformities of extremities. Neurologic:  Normal speech and language. No gross focal neurologic deficits are appreciated.  Skin:  Skin is warm, dry and intact. No rash noted. Psychiatric: Mood and affect are normal. Speech and behavior are normal.  ____________________________________________   LABS (all labs ordered are listed, but only abnormal results are displayed)  Labs Reviewed  CBC WITH DIFFERENTIAL/PLATELET - Abnormal; Notable for the following components:      Result Value   WBC 11.2 (*)    RBC 3.73 (*)    Hemoglobin 10.5 (*)    HCT 33.0 (*)    Neutro Abs 9.4 (*)    Eosinophils Absolute 0.7 (*)    All other components within normal limits  COMPREHENSIVE METABOLIC PANEL - Abnormal; Notable for the following components:   Sodium 134 (*)    Glucose, Bld 225 (*)    BUN 30 (*)    All other components within normal limits  URINALYSIS, COMPLETE (UACMP) WITH MICROSCOPIC - Abnormal; Notable for the following components:   Color, Urine YELLOW (*)    APPearance  CLEAR (*)    Leukocytes, UA SMALL (*)    Bacteria, UA RARE (*)    All other components within normal limits  GASTROINTESTINAL PANEL BY PCR, STOOL (REPLACES STOOL CULTURE)  C DIFFICILE QUICK SCREEN W PCR REFLEX  URINE CULTURE  LIPASE, BLOOD   ____________________________________________  EKG  None - EKG not ordered by ED physician ____________________________________________  RADIOLOGY   ED MD interpretation:  No acute abnormalities on CT abd/pelvis  Official radiology report(s): Ct Abdomen Pelvis W Contrast  Result Date: 10/28/2018 CLINICAL DATA:  Diarrhea and abdominal pain. EXAM: CT ABDOMEN AND PELVIS WITH CONTRAST TECHNIQUE: Multidetector CT imaging of the abdomen and pelvis was performed using the standard protocol following bolus administration of intravenous contrast. CONTRAST:  145mL ISOVUE-300 IOPAMIDOL (ISOVUE-300) INJECTION 61% COMPARISON:  CT abdomen pelvis 04/08/2017 FINDINGS: LOWER CHEST: There is no basilar pleural or apical pericardial effusion. Moderate cardiomegaly. HEPATOBILIARY: The hepatic contours and density are normal. There is no intra- or extrahepatic biliary dilatation. The gallbladder is normal. PANCREAS: The pancreatic parenchymal contours are normal and there is no ductal dilatation. There is no peripancreatic fluid collection. SPLEEN: Normal. ADRENALS/URINARY TRACT: --Adrenal glands: Normal. --Right kidney/ureter: No hydronephrosis, nephroureterolithiasis, perinephric stranding or solid renal mass. --Left kidney/ureter: Stones at the renal pelvis measure up to 10 mm. No hydronephrosis or ureteral stone. --Urinary bladder: Moderately distended. STOMACH/BOWEL: --Stomach/Duodenum: Gastrostomy tube with tip in the gastric lumen. --Small bowel: No dilatation or inflammation. --Colon: No focal abnormality. --Appendix: Surgically absent. VASCULAR/LYMPHATIC: There is aortic atherosclerosis without hemodynamically significant stenosis. There is an SMA stent.no abdominal  or pelvic lymphadenopathy. REPRODUCTIVE: Status post hysterectomy. No adnexal mass. MUSCULOSKELETAL. Multilevel degenerative disc disease and facet arthrosis. No bony spinal canal stenosis. OTHER: None. IMPRESSION: 1. No acute abdominal or pelvic abnormality. 2. Moderate distention of the urinary bladder. 3. Stones within the left renal pelvis measuring up to 10 mm. No hydronephrosis or ureteral stone. 4. Aortic atherosclerosis (ICD10-I70.0). Extensive atherosclerosis of proximal segments of major abdominal aortic branches. 5. Moderate cardiomegaly. Electronically Signed   By: Lennette Bihari  [place] M.D.   On: 10/28/2018 03:39    ____________________________________________   PROCEDURES  Critical Care performed: No   Procedure(s) performed:   Procedures   ____________________________________________   INITIAL IMPRESSION / ASSESSMENT AND PLAN / ED COURSE  As part of my medical decision making, I reviewed the following data within the [place] notes reviewed and incorporated, Labs reviewed ,  Old chart reviewed and Notes from prior ED visits    Differential diagnosis includes, but is not limited to, viral illness, bacterial GI infection such as Campylobacter or Salmonella, C. difficile, other nonspecific colitis, less likely mesenteric ischemia, neoplasm, toxic megacolon.  The patient is well-appearing in spite of her age and symptoms and in no distress at this time.  Vital signs are stable and her lab work is actually quite reassuring with essentially normal electrolytes except for a very slightly decreased sodium and a very slightly increased white blood cell count of 11.2.  Lipase is normal.  She is having no chest pain and no shortness of breath.  I have ordered GI pathogen panel including C. difficile but the patient has not had a bowel movement for hours.  Given her age and the abdominal tenderness I will obtain a CT scan with oral and G-tube contrast.  The patient  understands the need for a stool specimen if she is able to provide 1.  I am also providing normal saline 500 mL IV bolus.  Clinical Course as of Oct 28 452  Tue Oct 28, 2018  0451 After being in the emergency department for 9 hours and 20 minutes, the patient has been unable to produce a stool specimen.  At this point I think it is appropriate for discharge and outpatient follow-up.  The daughters told me that Dr. Gustavo Lah already has put in outpatient orders for stool studies so they will proceed with that later today.  She has a few white blood cells in her urine but I suspect this is asymptomatic and not contributory to the current issue.  I will send a urine culture but will not treat with antibiotics given the possibility I could worsen the situation with her bowels.  I gave my usual customary return precautions.   [CF]    Clinical Course User Index [CF] Hinda Kehr, MD    ____________________________________________  FINAL CLINICAL IMPRESSION(S) / ED DIAGNOSES  Final diagnoses:  Diarrhea, unspecified type  Abdominal pain, unspecified abdominal location     MEDICATIONS GIVEN DURING THIS VISIT:  Medications  sodium chloride 0.9 % bolus 500 mL (0 mLs Intravenous Stopped 10/28/18 0245)  iopamidol (ISOVUE-300) 61 % injection 30 mL (30 mLs Oral Contrast Given 10/28/18 0151)  rOPINIRole (REQUIP) tablet 2 mg (2 mg Oral Given 10/28/18 0314)  iopamidol (ISOVUE-300) 61 % injection 75 mL (100 mLs Intravenous Contrast Given 10/28/18 0321)     ED Discharge Orders    None       Note:  This document was prepared using Dragon voice recognition software and may include unintentional dictation errors.   Hinda Kehr, MD 10/28/18 [phone]

## 2018-10-30 LAB — URINE CULTURE
Culture: >=100000 — AB
Special Requests: NORMAL

## 2018-11-01 NOTE — Progress Notes (Signed)
ED Antimicrobial Stewardship Positive Culture Follow Up   [name] is an 85 y.o. female who presented to [facility] on 10/28/2018 with a chief complaint of  Chief Complaint  Patient presents with  . Diarrhea    Recent Results (from the past 720 hour(s))  Gastrointestinal Panel by PCR , Stool     Status: None   Collection Time: 10/28/18  1:25 AM  Result Value Ref Range Status   Campylobacter species NOT DETECTED NOT DETECTED Final   Plesimonas shigelloides NOT DETECTED NOT DETECTED Final   Salmonella species NOT DETECTED NOT DETECTED Final   Yersinia enterocolitica NOT DETECTED NOT DETECTED Final   Vibrio species NOT DETECTED NOT DETECTED Final   Vibrio cholerae NOT DETECTED NOT DETECTED Final   Enteroaggregative E coli (EAEC) NOT DETECTED NOT DETECTED Final   Enteropathogenic E coli (EPEC) NOT DETECTED NOT DETECTED Final   Enterotoxigenic E coli (ETEC) NOT DETECTED NOT DETECTED Final   Shiga like toxin producing E coli (STEC) NOT DETECTED NOT DETECTED Final   Shigella/Enteroinvasive E coli (EIEC) NOT DETECTED NOT DETECTED Final   Cryptosporidium NOT DETECTED NOT DETECTED Final   Cyclospora cayetanensis NOT DETECTED NOT DETECTED Final   Entamoeba histolytica NOT DETECTED NOT DETECTED Final   Giardia lamblia NOT DETECTED NOT DETECTED Final   Adenovirus F40/41 NOT DETECTED NOT DETECTED Final   Astrovirus NOT DETECTED NOT DETECTED Final   Norovirus GI/GII NOT DETECTED NOT DETECTED Final   Rotavirus A NOT DETECTED NOT DETECTED Final   Sapovirus (I, II, IV, and V) NOT DETECTED NOT DETECTED Final    Comment: Performed at [facility], [address] [place], [place] [address]  Urine Culture     Status: Abnormal   Collection Time: 10/28/18  4:19 AM  Result Value Ref Range Status   Specimen Description   Final    URINE, CLEAN CATCH Performed at [facility], [address] [place], [place] [address]    Special Requests   Final    Normal Performed at  [facility], [address] [place], [place] [address]    Culture >=100,000 COLONIES/mL ENTEROCOCCUS FAECALIS (A)  Final   Report Status 10/30/2018 FINAL  Final   Organism ID, Bacteria ENTEROCOCCUS FAECALIS (A)  Final      Susceptibility   Enterococcus faecalis - MIC*    AMPICILLIN <=2 SENSITIVE Sensitive     LEVOFLOXACIN >=8 RESISTANT Resistant     NITROFURANTOIN <=16 SENSITIVE Sensitive     VANCOMYCIN 1 SENSITIVE Sensitive     * >=100,000 COLONIES/mL ENTEROCOCCUS FAECALIS     [x]  Patient discharged originally without antimicrobial agent and treatment is now indicated  New antibiotic prescription: None - spoke with MD and said if patient not having any urinary S/S of fevers to not prescribe antibiotics as could be contaminant due to diarrhea upon ED visit. Called and spoke with patient who states she has not had a fever or any urinary S/S including burning, hematuria, etc. She states she is better since visit.  ED Provider: Dr. Brien Mates, PharmD Pharmacy Resident  11/01/2018 10:50 AM Clinical Pharmacist Monday - Friday phone -  [phone] Saturday - Sunday phone - [phone]

## 2018-11-03 ENCOUNTER — Telehealth: Payer: Self-pay

## 2018-11-03 NOTE — Telephone Encounter (Signed)
Nutrition  Called patient for nutrition follow-up regarding diarrhea.  Patient reports that diarrhea is better.  Noted ED visit on 1/28 with no issues found (? Viral illness, bacterial GI infection.  Noted patient was not able to have bowel movement in ED for specimen.    Patient reports was able to give sample after coming home and has not heard anything about sample.  Reports NP called her today and she has UTI and will started on antibiotics.  Reports NP told her to take imodium if has diarrhea from antibiotics.  Otherwise reports that she feels well today and stool is back to normal (loose as before diarrhea illness).  Concerned about son-in-law that has memory issues and had right hip replaced and recently fell and has left hip fractured and current can't do surgery due to pneumonia.  Patient worried about her daughter.    Intervention: Active listening provided.   Recommend patient to continue with current tube feeding regiment of jevity 1.5 4 cartons per day.   Spoke with T[place]illa RD and RD aware of diarrhea episodes and had spoke with daughter last week.  Contact information given.   Next visit: Patient will be seen in [place] phone follow-up Feb 27  Nicholos Aloisi B. Zenia Resides, [place], [place] Registered Dietitian [phone] (pager)

## 2018-11-11 ENCOUNTER — Ambulatory Visit (INDEPENDENT_AMBULATORY_CARE_PROVIDER_SITE_OTHER): Payer: Medicare Other | Admitting: Vascular Surgery

## 2018-11-11 ENCOUNTER — Other Ambulatory Visit: Payer: Self-pay

## 2018-11-11 ENCOUNTER — Ambulatory Visit: Payer: Medicare Other | Admitting: Anesthesiology

## 2018-11-11 ENCOUNTER — Ambulatory Visit
Admission: RE | Admit: 2018-11-11 | Discharge: 2018-11-11 | Disposition: A | Discharge status: Home / Self Care | Payer: Medicare Other | Source: Ambulatory Visit | Attending: Ophthalmology | Admitting: Ophthalmology

## 2018-11-11 ENCOUNTER — Encounter
Admission: RE | Disposition: A | Discharge status: Home / Self Care | Payer: Self-pay | Source: Ambulatory Visit | Attending: Ophthalmology

## 2018-11-11 ENCOUNTER — Encounter: Payer: Self-pay | Admitting: Ophthalmology

## 2018-11-11 ENCOUNTER — Encounter (INDEPENDENT_AMBULATORY_CARE_PROVIDER_SITE_OTHER): Payer: Medicare Other

## 2018-11-11 DIAGNOSIS — G2581 Restless legs syndrome: Secondary | ICD-10-CM | POA: Diagnosis not present

## 2018-11-11 DIAGNOSIS — H2512 Age-related nuclear cataract, left eye: Secondary | ICD-10-CM | POA: Insufficient documentation

## 2018-11-11 DIAGNOSIS — Z931 Gastrostomy status: Secondary | ICD-10-CM | POA: Insufficient documentation

## 2018-11-11 DIAGNOSIS — Z888 Allergy status to other drugs, medicaments and biological substances status: Secondary | ICD-10-CM | POA: Diagnosis not present

## 2018-11-11 DIAGNOSIS — Z87442 Personal history of urinary calculi: Secondary | ICD-10-CM | POA: Diagnosis not present

## 2018-11-11 DIAGNOSIS — H9193 Unspecified hearing loss, bilateral: Secondary | ICD-10-CM | POA: Insufficient documentation

## 2018-11-11 DIAGNOSIS — I10 Essential (primary) hypertension: Secondary | ICD-10-CM | POA: Diagnosis not present

## 2018-11-11 DIAGNOSIS — K279 Peptic ulcer, site unspecified, unspecified as acute or chronic, without hemorrhage or perforation: Secondary | ICD-10-CM | POA: Diagnosis not present

## 2018-11-11 DIAGNOSIS — K219 Gastro-esophageal reflux disease without esophagitis: Secondary | ICD-10-CM | POA: Diagnosis not present

## 2018-11-11 DIAGNOSIS — I499 Cardiac arrhythmia, unspecified: Secondary | ICD-10-CM | POA: Insufficient documentation

## 2018-11-11 DIAGNOSIS — Z7984 Long term (current) use of oral hypoglycemic drugs: Secondary | ICD-10-CM | POA: Diagnosis not present

## 2018-11-11 DIAGNOSIS — Z9841 Cataract extraction status, right eye: Secondary | ICD-10-CM | POA: Diagnosis not present

## 2018-11-11 DIAGNOSIS — I272 Pulmonary hypertension, unspecified: Secondary | ICD-10-CM | POA: Insufficient documentation

## 2018-11-11 DIAGNOSIS — Z981 Arthrodesis status: Secondary | ICD-10-CM | POA: Diagnosis not present

## 2018-11-11 DIAGNOSIS — M1990 Unspecified osteoarthritis, unspecified site: Secondary | ICD-10-CM | POA: Insufficient documentation

## 2018-11-11 DIAGNOSIS — Z85828 Personal history of other malignant neoplasm of skin: Secondary | ICD-10-CM | POA: Insufficient documentation

## 2018-11-11 DIAGNOSIS — R42 Dizziness and giddiness: Secondary | ICD-10-CM | POA: Diagnosis not present

## 2018-11-11 DIAGNOSIS — D649 Anemia, unspecified: Secondary | ICD-10-CM | POA: Insufficient documentation

## 2018-11-11 DIAGNOSIS — G43909 Migraine, unspecified, not intractable, without status migrainosus: Secondary | ICD-10-CM | POA: Diagnosis not present

## 2018-11-11 DIAGNOSIS — Z90710 Acquired absence of both cervix and uterus: Secondary | ICD-10-CM | POA: Diagnosis not present

## 2018-11-11 DIAGNOSIS — E119 Type 2 diabetes mellitus without complications: Secondary | ICD-10-CM | POA: Insufficient documentation

## 2018-11-11 DIAGNOSIS — M109 Gout, unspecified: Secondary | ICD-10-CM | POA: Diagnosis not present

## 2018-11-11 HISTORY — DX: Dizziness and giddiness: R42

## 2018-11-11 HISTORY — DX: Gastrostomy status: Z93.1

## 2018-11-11 HISTORY — PX: CATARACT EXTRACTION W/PHACO: SHX586

## 2018-11-11 HISTORY — DX: Restless legs syndrome: G25.81

## 2018-11-11 LAB — GLUCOSE, CAPILLARY: Glucose-Capillary: 98 mg/dL (ref 70–99)

## 2018-11-11 SURGERY — PHACOEMULSIFICATION, CATARACT, WITH IOL INSERTION
Anesthesia: Monitor Anesthesia Care | Site: Eye | Laterality: Left

## 2018-11-11 MED ORDER — POVIDONE-IODINE 5 % OP SOLN
OPHTHALMIC | Status: DC | PRN
  Administered 2018-11-11: 1 via OPHTHALMIC

## 2018-11-11 MED ORDER — TETRACAINE HCL 0.5 % OP SOLN
OPHTHALMIC | Status: AC
  Filled 2018-11-11: qty 4

## 2018-11-11 MED ORDER — LIDOCAINE HCL (PF) 4 % IJ SOLN
INTRAMUSCULAR | Status: AC
  Filled 2018-11-11: qty 5

## 2018-11-11 MED ORDER — MOXIFLOXACIN HCL 0.5 % OP SOLN: 1.0000 [drp] | OPHTHALMIC | Status: DC | PRN

## 2018-11-11 MED ORDER — POVIDONE-IODINE 5 % OP SOLN
OPHTHALMIC | Status: AC
  Filled 2018-11-11: qty 30

## 2018-11-11 MED ORDER — SODIUM CHLORIDE 0.9 % IV SOLN
INTRAVENOUS | Status: DC
  Administered 2018-11-11: 07:00:00 via INTRAVENOUS

## 2018-11-11 MED ORDER — NA CHONDROIT SULF-NA HYALURON 40-17 MG/ML IO SOLN
INTRAOCULAR | Status: DC | PRN
  Administered 2018-11-11: 1 mL via INTRAOCULAR

## 2018-11-11 MED ORDER — LIDOCAINE HCL (PF) 4 % IJ SOLN
INTRAOCULAR | Status: DC | PRN
  Administered 2018-11-11: 2 mL via OPHTHALMIC

## 2018-11-11 MED ORDER — NA CHONDROIT SULF-NA HYALURON 40-17 MG/ML IO SOLN
INTRAOCULAR | Status: AC
  Filled 2018-11-11: qty 1

## 2018-11-11 MED ORDER — ARMC OPHTHALMIC DILATING DROPS
OPHTHALMIC | Status: AC
  Filled 2018-11-11: qty 0.5

## 2018-11-11 MED ORDER — MOXIFLOXACIN HCL 0.5 % OP SOLN
OPHTHALMIC | Status: AC
  Filled 2018-11-11: qty 3

## 2018-11-11 MED ORDER — EPINEPHRINE PF 1 MG/ML IJ SOLN
INTRAMUSCULAR | Status: AC
  Filled 2018-11-11: qty 1

## 2018-11-11 MED ORDER — TETRACAINE HCL 0.5 % OP SOLN
1.0000 [drp] | OPHTHALMIC | Status: AC | PRN
  Administered 2018-11-11 (×3): 1 [drp] via OPHTHALMIC

## 2018-11-11 MED ORDER — ARMC OPHTHALMIC DILATING DROPS
1.0000 "application " | OPHTHALMIC | Status: AC
  Administered 2018-11-11 (×3): 1 via OPHTHALMIC

## 2018-11-11 MED ORDER — MOXIFLOXACIN HCL 0.5 % OP SOLN
OPHTHALMIC | Status: DC | PRN
  Administered 2018-11-11: .2 mL via OPHTHALMIC

## 2018-11-11 MED ORDER — CARBACHOL 0.01 % IO SOLN
INTRAOCULAR | Status: DC | PRN
  Administered 2018-11-11: .5 mL via INTRAOCULAR

## 2018-11-11 MED ORDER — EPINEPHRINE PF 1 MG/ML IJ SOLN
INTRAOCULAR | Status: DC | PRN
  Administered 2018-11-11: 1 mL via OPHTHALMIC

## 2018-11-11 SURGICAL SUPPLY — 16 items
GLOVE BIO SURGEON STRL SZ8 (GLOVE) ×2 IMPLANT
GLOVE BIOGEL M 6.5 STRL (GLOVE) ×2 IMPLANT
GLOVE SURG LX 8.0 MICRO (GLOVE) ×1
GLOVE SURG LX STRL 8.0 MICRO (GLOVE) ×1 IMPLANT
GOWN STRL REUS W/ TWL LRG LVL3 (GOWN DISPOSABLE) ×2 IMPLANT
GOWN STRL REUS W/TWL LRG LVL3 (GOWN DISPOSABLE) ×2
LABEL CATARACT MEDS ST (LABEL) ×2 IMPLANT
LENS IOL ACRYSOF IQ 22.5 (Intraocular Lens) ×2 IMPLANT
PACK CATARACT (MISCELLANEOUS) ×2 IMPLANT
PACK CATARACT BRASINGTON LX (MISCELLANEOUS) ×2 IMPLANT
PACK EYE AFTER SURG (MISCELLANEOUS) ×2 IMPLANT
SOL BSS BAG (MISCELLANEOUS) ×2
SOLUTION BSS BAG (MISCELLANEOUS) ×1 IMPLANT
SYR 5ML LL (SYRINGE) ×2 IMPLANT
WATER STERILE IRR 250ML POUR (IV SOLUTION) ×2 IMPLANT
WIPE NON LINTING 3.25X3.25 (MISCELLANEOUS) ×2 IMPLANT

## 2018-11-11 NOTE — Progress Notes (Signed)
Arrived 0819, not [phone]

## 2018-11-11 NOTE — Transfer of Care (Signed)
Immediate Anesthesia Transfer of Care Note  Patient: [name] Day  Procedure(s) Performed: CATARACT EXTRACTION PHACO AND INTRAOCULAR LENS PLACEMENT (IOC) LEFT, DIABETIC (Left Eye)  Patient Location: PACU and Short Stay  Anesthesia Type:MAC  Level of Consciousness: awake, alert  and oriented  Airway & Oxygen Therapy: Patient Spontanous Breathing  Post-op Assessment: Report given to RN and Post -op Vital signs reviewed and stable  Post vital signs: Reviewed and stable  Last Vitals:  Vitals Value Taken Time  BP    Temp    Pulse    Resp    SpO2      Last Pain:  Vitals:   11/11/18 0642  TempSrc: Temporal  PainSc: 0-No pain         Complications: No apparent anesthesia complications

## 2018-11-11 NOTE — Anesthesia Post-op Follow-up Note (Signed)
Anesthesia QCDR form completed.        

## 2018-11-11 NOTE — Op Note (Signed)
PREOPERATIVE DIAGNOSIS:  Nuclear sclerotic cataract of the left eye.   POSTOPERATIVE DIAGNOSIS:  Nuclear sclerotic cataract of the left eye.   OPERATIVE PROCEDURE: Procedure(s): CATARACT EXTRACTION PHACO AND INTRAOCULAR LENS PLACEMENT (IOC) LEFT, DIABETIC   SURGEON:  Birder Robson, MD.   ANESTHESIA:  Anesthesiologist: Emmie Niemann, MD CRNA: Marsh Dolly, CRNA  1.      Managed anesthesia care. 2.     0.11ml of Shugarcaine was instilled following the paracentesis   COMPLICATIONS:  None.   TECHNIQUE:   Stop and chop   DESCRIPTION OF PROCEDURE:  The patient was examined and consented in the preoperative holding area where the aforementioned topical anesthesia was applied to the left eye and then brought back to the Operating Room where the left eye was prepped and draped in the usual sterile ophthalmic fashion and a lid speculum was placed. A paracentesis was created with the side port blade and the anterior chamber was filled with viscoelastic. A near clear corneal incision was performed with the steel keratome. A continuous curvilinear capsulorrhexis was performed with a cystotome followed by the capsulorrhexis forceps. Hydrodissection and hydrodelineation were carried out with BSS on a blunt cannula. The lens was removed in a stop and chop  technique and the remaining cortical material was removed with the irrigation-aspiration handpiece. The capsular bag was inflated with viscoelastic and the Technis ZCB00 lens was placed in the capsular bag without complication. The remaining viscoelastic was removed from the eye with the irrigation-aspiration handpiece. The wounds were hydrated. The anterior chamber was flushed with Miostat and the eye was inflated to physiologic pressure. 0.37ml Vigamox was placed in the anterior chamber. The wounds were found to be water tight. The eye was dressed with Vigamox. The patient was given protective glasses to wear throughout the day and a shield with which to  sleep tonight. The patient was also given drops with which to begin a drop regimen today and will follow-up with me in one day. Implant Name Type Inv. Item Serial No. Manufacturer Lot No. LRB No. Used  LENS IOL ACRYSOF IQ 22.5 - [id number] Intraocular Lens LENS IOL ACRYSOF IQ 22.5 [id number] [employer]  Left 1    Procedure(s) with comments: CATARACT EXTRACTION PHACO AND INTRAOCULAR LENS PLACEMENT (IOC) LEFT, DIABETIC (Left) - [place]  00:41 CDE 6.41 Fluid pack lot # [id number]  Electronically signed: Birder Robson 11/11/2018 8:18 AM

## 2018-11-11 NOTE — Anesthesia Postprocedure Evaluation (Signed)
Anesthesia Post Note  Patient: FANNY AGAN Day  Procedure(s) Performed: CATARACT EXTRACTION PHACO AND INTRAOCULAR LENS PLACEMENT (IOC) LEFT, DIABETIC (Left Eye)  Patient location during evaluation: PACU Anesthesia Type: MAC Level of consciousness: awake and alert and oriented Pain management: pain level controlled Vital Signs Assessment: post-procedure vital signs reviewed and stable Respiratory status: spontaneous breathing, nonlabored ventilation and respiratory function stable Cardiovascular status: blood pressure returned to baseline and stable Postop Assessment: no signs of nausea or vomiting Anesthetic complications: no     Last Vitals:  Vitals:   11/11/18 0642 11/11/18 0820  BP: (!) 126/39 (!) 142/45  Pulse: (!) 51 (!) 52  Resp: 16 16  Temp: 36.6 C (!) 36.2 C  SpO2: 99% 100%    Last Pain:  Vitals:   11/11/18 0820  TempSrc: Temporal  PainSc: 0-No pain                 Ottie Neglia

## 2018-11-11 NOTE — H&P (Signed)
All labs reviewed. Abnormal studies sent to patients PCP when indicated.  Previous H&P reviewed, patient examined, there are NO CHANGES.  [name] Cancio Porfilio2/11/20207:53 AM

## 2018-11-11 NOTE — Anesthesia Preprocedure Evaluation (Signed)
Anesthesia Evaluation  Patient identified by MRN, date of birth, ID band Patient awake    Reviewed: Allergy & Precautions, NPO status , Patient's Chart, lab work & pertinent test results  History of Anesthesia Complications Negative for: history of anesthetic complications  Airway Mallampati: III   Neck ROM: Full  Mouth opening: Limited Mouth Opening  Dental  (+) Poor Dentition   Pulmonary neg pulmonary ROS, neg sleep apnea, neg COPD,    breath sounds clear to auscultation- rhonchi (-) wheezing      Cardiovascular hypertension, Pt. on medications + Peripheral Vascular Disease  (-) CAD, (-) Past MI, (-) Cardiac Stents and (-) CABG  Rhythm:Regular Rate:Normal - Systolic murmurs and - Diastolic murmurs    Neuro/Psych neg Seizures negative neurological ROS  negative psych ROS   GI/Hepatic PUD, GERD  ,  Endo/Other  diabetes, Oral Hypoglycemic Agents  Renal/GU Renal InsufficiencyRenal disease     Musculoskeletal  (+) Arthritis ,   Abdominal (+) - obese,   Peds  Hematology  (+) anemia ,   Anesthesia Other Findings Past Medical History: No date: Anemia     Comment:  IRON INFUSIONS No date: Aortic valve sclerosis No date: Arrhythmia No date: Arthritis     Comment:  osteoarthritis No date: Basal cell carcinoma of skin No date: Brain tumor ([place]) No date: Brain tumor ([place]) No date: Cervical spine disease No date: Diabetes mellitus without complication (HCC) No date: Dysrhythmia     Comment:  sinus arrhythmia No date: Esophageal stricture     Comment:  severe, led to feeding tube placement in Sept 2019 No date: Esophageal ulcer without bleeding No date: Gastrostomy tube dependent ([place])     Comment:  DOES NOT EAT OR DRINK  No date: GERD (gastroesophageal reflux disease) No date: History of kidney stones No date: Hyperlipemia No date: Hypertension No date: Kidney stones No date: Leaky heart valve No date:  Meningioma ([place]) No date: Occlusive mesenteric ischemia (HCC) No date: Pulmonary hypertension (HCC) No date: RLS (restless legs syndrome) No date: Vertigo   Reproductive/Obstetrics                             Anesthesia Physical Anesthesia Plan  ASA: III  Anesthesia Plan: MAC   Post-op Pain Management:    Induction: Intravenous  PONV Risk Score and Plan: 2 and Midazolam  Airway Management Planned: Natural Airway  Additional Equipment:   Intra-op Plan:   Post-operative Plan:   Informed Consent: I have reviewed the patients History and Physical, chart, labs and discussed the procedure including the risks, benefits and alternatives for the proposed anesthesia with the patient or authorized representative who has indicated his/her understanding and acceptance.       Plan Discussed with: CRNA and Anesthesiologist  Anesthesia Plan Comments:         Anesthesia Quick Evaluation

## 2018-11-11 NOTE — Discharge Instructions (Addendum)
Eye Surgery Discharge Instructions  Expect mild scratchy sensation or mild soreness. DO NOT RUB YOUR EYE!  The day of surgery:  Minimal physical activity, but bed rest is not required  No reading, computer work, or close hand work  No bending, lifting, or straining.  May watch TV  For 24 hours:  No driving, legal decisions, or alcoholic beverages  Safety precautions  Eat anything you prefer: It is better to start with liquids, then soup then solid foods.  Solar shield eyeglasses should be worn for comfort in the sunlight/patch while sleeping  Resume all regular medications including aspirin or Coumadin if these were discontinued prior to surgery. You may shower, bathe, shave, or wash your hair. Tylenol may be taken for mild discomfort. Follow Dr. Inda Coke discharge instruction sheet as reviewed.  Call your doctor if you experience significant pain, nausea, or vomiting, fever > 101 or other signs of infection. [phone] or [phone] Specific instructions:  Follow-up Information    Birder Robson, MD Follow up.   Specialty:  Ophthalmology Why:  11/12/18 @ 10:25 am Contact information: [place] [address] [phone]

## 2018-11-18 ENCOUNTER — Ambulatory Visit (INDEPENDENT_AMBULATORY_CARE_PROVIDER_SITE_OTHER): Payer: Medicare Other

## 2018-11-18 ENCOUNTER — Encounter (INDEPENDENT_AMBULATORY_CARE_PROVIDER_SITE_OTHER): Payer: Self-pay | Admitting: Vascular Surgery

## 2018-11-18 ENCOUNTER — Ambulatory Visit (INDEPENDENT_AMBULATORY_CARE_PROVIDER_SITE_OTHER): Payer: Medicare Other | Admitting: Vascular Surgery

## 2018-11-18 VITALS — BP 146/58 | HR 69 | Resp 16 | Ht 63.0 in | Wt 118.0 lb

## 2018-11-18 DIAGNOSIS — K559 Vascular disorder of intestine, unspecified: Secondary | ICD-10-CM

## 2018-11-18 DIAGNOSIS — B258 Other cytomegaloviral diseases: Secondary | ICD-10-CM

## 2018-11-18 DIAGNOSIS — E785 Hyperlipidemia, unspecified: Secondary | ICD-10-CM

## 2018-11-18 DIAGNOSIS — E118 Type 2 diabetes mellitus with unspecified complications: Secondary | ICD-10-CM

## 2018-11-18 DIAGNOSIS — I10 Essential (primary) hypertension: Secondary | ICD-10-CM

## 2018-11-18 DIAGNOSIS — K208 Other esophagitis without bleeding: Secondary | ICD-10-CM

## 2018-11-18 DIAGNOSIS — K222 Esophageal obstruction: Secondary | ICD-10-CM

## 2018-11-18 NOTE — Progress Notes (Signed)
MRN : [id number]  [name] is a 85 y.o. (1928-10-20) female who presents with chief complaint of  Chief Complaint  Patient presents with  . Follow-up    55month ultrasound  .  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  Secondary to an esophageal stricture that could no longer be dilated, she now has a feeding tube.  With that, she has gained weight and has not had significant abdominal pain.  Her mesenteric duplex today shows somewhat improved velocities although still elevated in her SMA stent.  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  Current Outpatient Medications  Medication Sig Dispense Refill  . acetaminophen (TYLENOL) 160 MG/5ML solution Place 20.3 mLs (650 mg total) into feeding tube every 6 (six) hours as needed for mild pain. (Patient taking differently: Place 960 mg into feeding tube every 6 (six) hours as needed for mild pain or headache. ) 120 mL 0  . allopurinol (ZYLOPRIM) 100 MG tablet Place 100 mg into feeding tube daily.    . Amino Acids-Protein Hydrolys (FEEDING SUPPLEMENT, PRO-STAT SUGAR FREE 64,) LIQD Place 30 mLs into feeding tube daily. 450 mL 1  . clopidogrel (PLAVIX) 75 MG tablet TAKE 1 TABLET BY MOUTH ONCE DAILY (Patient taking differently: Take 75 mg by mouth daily. ) 30 tablet 2  . glipiZIDE (GLUCOTROL) 5 MG tablet Place 1 tablet (5 mg total) into feeding tube daily before breakfast.    . hydrALAZINE (APRESOLINE) 50 MG tablet Place 1 tablet (50 mg total) into feeding tube 2 (two) times daily.    . hydrochlorothiazide (HYDRODIURIL) 25 MG tablet Take 25 mg by mouth daily.  1  . Hypromellose (GENTEAL MILD) 0.2 % SOLN Place 1 drop into both eyes 3 (three) times daily.    Marland Kitchen lisinopril (PRINIVIL,ZESTRIL) 10 MG tablet Place 1 tablet (10 mg total) into feeding tube daily. 30 tablet 2  . loperamide (IMODIUM) 1 MG/5ML solution Take 3 mg by mouth as needed for diarrhea or loose stools.    . metoprolol tartrate (LOPRESSOR)  25 MG tablet Place 12.5 mg into feeding tube daily.   1  . Multiple Vitamin (MULTIVITAMIN) LIQD Place 15 mLs into feeding tube daily. 450 mL 1  . Nutritional Supplements (FEEDING SUPPLEMENT, JEVITY 1.5 CAL/FIBER,) LIQD Place 237 mLs into feeding tube 4 (four) times daily. 237 mL 90  . pantoprazole sodium (PROTONIX) 40 mg/20 mL PACK Place 20 mLs (40 mg total) into feeding tube 2 (two) times daily. 30 each 1  . rOPINIRole (REQUIP) 2 MG tablet Place 1 tablet (2 mg total) into feeding tube 2 (two) times daily. 60 tablet 1  . vitamin C (VITAMIN C) 250 MG tablet Place 1 tablet (250 mg total) into feeding tube 2 (two) times daily. 60 tablet 1  . Water For Irrigation, Sterile (FREE WATER) SOLN Place 30 mLs into feeding tube every 4 (four) hours.    . simethicone (MYLICON) 40 NL/9.7QB drops Take 1.2 mLs (80 mg total) by mouth 4 (four) times daily as needed for flatulence. If unable to tolerate orally, may administer via PEG tube. (Patient not taking: Reported on 10/30/2018) 90 mL 0   No current facility-administered medications for this visit.     Past Medical History:  Diagnosis Date  . Anemia    IRON INFUSIONS  . Aortic valve sclerosis   . Arrhythmia   . Arthritis    osteoarthritis  . Basal cell carcinoma of skin   . Brain  tumor ([place])   . Brain tumor ([place])   . Cervical spine disease   . Diabetes mellitus without complication ([place])   . Dysrhythmia    sinus arrhythmia  . Esophageal stricture    severe, led to feeding tube placement in Sept 2019  . Esophageal ulcer without bleeding   . Gastrostomy tube dependent ([place])    DOES NOT EAT OR DRINK   . GERD (gastroesophageal reflux disease)   . History of kidney stones   . Hyperlipemia   . Hypertension   . Kidney stones   . Leaky heart valve   . Meningioma ([place])   . 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 Pulmonary hypertension ([place])   . RLS (restless legs syndrome)   . Vertigo     Past Surgical History:  Procedure Laterality Date  .  ABDOMINAL HYSTERECTOMY    . APPENDECTOMY    . ARTHROGRAM KNEE Left   . BACK SURGERY     CERVIVAL NECK FUSION  . CATARACT EXTRACTION W/PHACO Left 11/11/2018   Procedure: CATARACT EXTRACTION PHACO AND INTRAOCULAR LENS PLACEMENT (IOC) LEFT, DIABETIC;  Surgeon: Birder Robson, MD;  Location: [facility] ORS;  Service: Ophthalmology;  Laterality: Left;  [place]  00:41 CDE 6.41 Fluid pack lot # [id number]  . COLONOSCOPY WITH PROPOFOL N/A 06/20/2017   Procedure: COLONOSCOPY WITH PROPOFOL;  Surgeon: Lollie Sails, MD;  Location: [facility] ENDOSCOPY;  Service: Endoscopy;  Laterality: N/A;  . ESOPHAGOGASTRODUODENOSCOPY (EGD) WITH PROPOFOL N/A 03/14/2015   Procedure: ESOPHAGOGASTRODUODENOSCOPY (EGD) WITH PROPOFOL;  Surgeon: Josefine Class, MD;  Location[facility] ENDOSCOPY;  Service: Endoscopy;  Laterality: N/A;  . ESOPHAGOGASTRODUODENOSCOPY (EGD) WITH PROPOFOL N/A 03/28/2017   Procedure: ESOPHAGOGASTRODUODENOSCOPY (EGD) WITH PROPOFOL;  Surgeon: Lollie Sails, MD;  Location: [facility] ENDOSCOPY;  Service: Endoscopy;  Laterality: N/A;  . ESOPHAGOGASTRODUODENOSCOPY (EGD) WITH PROPOFOL N/A 06/20/2017   Procedure: ESOPHAGOGASTRODUODENOSCOPY (EGD) WITH PROPOFOL;  Surgeon: Lollie Sails, MD;  Location: [facility] ENDOSCOPY;  Service: Endoscopy;  Laterality: N/A;  . ESOPHAGOGASTRODUODENOSCOPY (EGD) WITH PROPOFOL N/A 10/21/2017   Procedure: ESOPHAGOGASTRODUODENOSCOPY (EGD) WITH PROPOFOL;  Surgeon: Lollie Sails, MD;  Location: [facility] ENDOSCOPY;  Service: Endoscopy;  Laterality: N/A;  . ESOPHAGOGASTRODUODENOSCOPY (EGD) WITH PROPOFOL N/A 04/15/2018   Procedure: ESOPHAGOGASTRODUODENOSCOPY (EGD) WITH PROPOFOL;  Surgeon: Lollie Sails, MD;  Location: [facility] ENDOSCOPY;  Service: Endoscopy;  Laterality: N/A;  . ESOPHAGUS SURGERY     CLOSURE  . EYE SURGERY    . HYSTERECTOMY ABDOMINAL WITH SALPINGECTOMY    . IR GASTROSTOMY TUBE MOD SED  06/11/2018  . PERIPHERAL VASCULAR CATHETERIZATION N/A 01/23/2016   Procedure: Visceral  Venography;  Surgeon: Algernon Huxley, MD;  Location: [place] CV LAB;  Service: Cardiovascular;  Laterality: N/A;  . PERIPHERAL VASCULAR CATHETERIZATION  01/23/2016   Procedure: Peripheral Vascular Intervention;  Surgeon: Algernon Huxley, MD;  Location: [place] CV LAB;  Service: Cardiovascular;;  . UPPER ESOPHAGEAL ENDOSCOPIC ULTRASOUND (EUS) N/A 12/05/2017   Procedure: UPPER ESOPHAGEAL ENDOSCOPIC ULTRASOUND (EUS);  Surgeon: Jola Schmidt, MD;  Location: [facility] ENDOSCOPY;  Service: Endoscopy;  Laterality: N/A;  . VISCERAL ANGIOGRAPHY N/A 03/18/2017   Procedure: Visceral Angiography;  Surgeon: Algernon Huxley, MD;  Location: [place] CV LAB;  Service: Cardiovascular;  Laterality: N/A;  . VISCERAL ARTERY INTERVENTION N/A 03/18/2017   Procedure: Visceral Artery Intervention;  Surgeon: Algernon Huxley, MD;  Location: [place] CV LAB;  Service: Cardiovascular;  Laterality: N/A;    Social History  Substance Use Topics  . Smoking status: Never Smoker  .  Smokeless tobacco: Never Used  . Alcohol use No    Family History      Family History  Problem Relation Age of Onset  . Stroke Mother   . Hypertension Mother   . Cancer Father   . Heart attack Sister   . Diabetes Sister   . Heart attack Brother          Allergies  Allergen Reactions  . Gabapentin Swelling  . Lipitor [Atorvastatin] Other (See Comments)    Muscle aches  . Mevacor [Lovastatin] Other (See Comments)    Muscle aches  . Milk-Related Compounds     Large quantities cause headaches   . Statins Other (See Comments)    Muscle pain  . Sulfur Diarrhea and Nausea And Vomiting  . Zocor [Simvastatin] Other (See Comments)    Muscle aches     REVIEW OF SYSTEMS(Negative unless checked)  Constitutional: [x] ?Weight loss[] ?Fever[] ?Chills Cardiac:[] ?Chest pain[] ?Chest pressure[] ?Palpitations [] ?Shortness of breath when laying flat [] ?Shortness of breath at rest [] ?Shortness of  breath with exertion. Vascular: [] ?Pain in legs with walking[] ?Pain in legsat rest[] ?Pain in legs when laying flat [] ?Claudication [] ?Pain in feet when walking [] ?Pain in feet at rest [] ?Pain in feet when laying flat [] ?History of DVT [] ?Phlebitis [] ?Swelling in legs [] ?Varicose veins [] ?Non-healing ulcers Pulmonary: [] ?Uses home oxygen [] ?Productive cough[] ?Hemoptysis [] ?Wheeze [] ?COPD [] ?Asthma Neurologic: [] ?Dizziness [] ?Blackouts [] ?Seizures [] ?History of stroke [] ?History of TIA[] ?Aphasia [] ?Temporary blindness[] ?Dysphagia [] ?Weaknessor numbness in arms [] ?Weakness or numbnessin legs Musculoskeletal: [x] ?Arthritis [] ?Joint swelling [] ?Joint pain [] ?Low back pain Hematologic:[] ?Easy bruising[] ?Easy bleeding [] ?Hypercoagulable state [] ?Anemic  Gastrointestinal:[] ?Blood in stool[] ?Vomiting blood[x] ?Gastroesophageal reflux/heartburn[x] ?Abdominal pain Genitourinary: [] ?Chronic kidney disease [] ?Difficulturination [] ?Frequenturination [] ?Burning with urination[] ?Hematuria Skin: [] ?Rashes [] ?Ulcers [] ?Wounds Psychological: [] ?History of anxiety[] ?History of major depression.    Physical Examination  BP (!) 146/58 (BP Location: Right Arm)   Pulse 69   Resp 16   Ht 5\' 3"  (1.6 m)   Wt 118 lb (53.5 kg)   BMI 20.90 kg/m  Gen:  WD/WN, NAD Head: [place]/AT, No temporalis wasting. Ear/Nose/Throat: Hearing grossly intact, nares w/o erythema or drainage Eyes: Conjunctiva clear. Sclera non-icteric Neck: Supple.  Trachea midline Pulmonary:  Good air movement, no use of accessory muscles.  Cardiac: RRR, no JVD Vascular:  Vessel Right Left  Radial Palpable Palpable                          PT Palpable Palpable  DP Palpable Palpable   Gastrointestinal: soft, non-tender/non-distended.  Feeding tube in place Musculoskeletal: M/S 5/5 throughout.  No deformity or atrophy.  No edema. Neurologic:  Sensation grossly intact in extremities.  Symmetrical.  Speech is fluent.  Psychiatric: Judgment intact, Mood & affect appropriate for pt's clinical situation. Dermatologic: No rashes or ulcers noted.  No cellulitis or open wounds.       Labs Recent Results (from the past 2160 hour(s))  Ferritin     Status: None   Collection Time: 09/22/18  1:56 PM  Result Value Ref Range   Ferritin 80 11 - 307 ng/mL    Comment: Performed at [facility], [address] [place],  [address]  CBC with Differential     Status: Abnormal   Collection Time: 09/22/18  1:56 PM  Result Value Ref Range   WBC 5.4 4.0 - 10.5 K/uL   RBC 3.38 (L) 3.87 - 5.11 MIL/uL   Hemoglobin 9.3 (L) 12.0 - 15.0 g/dL   HCT 29.7 (L) 36.0 - 46.0 %   MCV 87.9 80.0 - 100.0 fL  MCH 27.5 26.0 - 34.0 pg   MCHC 31.3 30.0 - 36.0 g/dL   RDW 15.1 11.5 - 15.5 %   Platelets 279 150 - 400 K/uL   nRBC 0.0 0.0 - 0.2 %   Neutrophils Relative % 69 %   Neutro Abs 3.8 1.7 - 7.7 K/uL   Lymphocytes Relative 20 %   Lymphs Abs 1.1 0.7 - 4.0 K/uL   Monocytes Relative 6 %   Monocytes Absolute 0.3 0.1 - 1.0 K/uL   Eosinophils Relative 4 %   Eosinophils Absolute 0.2 0.0 - 0.5 K/uL   Basophils Relative 1 %   Basophils Absolute 0.0 0.0 - 0.1 K/uL   Immature Granulocytes 0 %   Abs Immature Granulocytes 0.01 0.00 - 0.07 K/uL    Comment: Performed at [facility], [address] [place], [place] [address]  CBC with Differential     Status: Abnormal   Collection Time: 10/27/18  7:37 PM  Result Value Ref Range   WBC 11.2 (H) 4.0 - 10.5 K/uL   RBC 3.73 (L) 3.87 - 5.11 MIL/uL   Hemoglobin 10.5 (L) 12.0 - 15.0 g/dL   HCT 33.0 (L) 36.0 - 46.0 %   MCV 88.5 80.0 - 100.0 fL   MCH 28.2 26.0 - 34.0 pg   MCHC 31.8 30.0 - 36.0 g/dL   RDW 15.2 11.5 - 15.5 %   Platelets 319 150 - 400 K/uL   nRBC 0.0 0.0 - 0.2 %   Neutrophils Relative % 86 %   Neutro Abs 9.4 (H) 1.7 - 7.7 K/uL   Lymphocytes Relative 6 %   Lymphs Abs 0.7 0.7 -  4.0 K/uL   Monocytes Relative 2 %   Monocytes Absolute 0.3 0.1 - 1.0 K/uL   Eosinophils Relative 6 %   Eosinophils Absolute 0.7 (H) 0.0 - 0.5 K/uL   Basophils Relative 0 %   Basophils Absolute 0.0 0.0 - 0.1 K/uL   Immature Granulocytes 0 %   Abs Immature Granulocytes 0.05 0.00 - 0.07 K/uL    Comment: Performed at [facility], [address] [place], [place] [address]  Comprehensive metabolic panel     Status: Abnormal   Collection Time: 10/27/18  7:37 PM  Result Value Ref Range   Sodium 134 (L) 135 - 145 mmol/L   Potassium 3.8 3.5 - 5.1 mmol/L   Chloride 98 98 - 111 mmol/L   CO2 29 22 - 32 mmol/L   Glucose, Bld 225 (H) 70 - 99 mg/dL   BUN 30 (H) 8 - 23 mg/dL   Creatinine, Ser 0.76 0.44 - 1.00 mg/dL   Calcium 8.9 8.9 - 10.3 mg/dL   Total Protein 6.7 6.5 - 8.1 g/dL   Albumin 3.5 3.5 - 5.0 g/dL   AST 18 15 - 41 U/L   ALT 13 0 - 44 U/L   Alkaline Phosphatase 77 38 - 126 U/L   Total Bilirubin 0.4 0.3 - 1.2 mg/dL   GFR calc non Af Amer >60 >60 mL/min   GFR calc Af Amer >60 >60 mL/min   Anion gap 7 5 - 15    Comment: Performed at [facility], [address] [place], [place] [address]  Lipase, blood     Status: None   Collection Time: 10/27/18  7:37 PM  Result Value Ref Range   Lipase 24 11 - 51 U/L    Comment: Performed at [facility], [address], [place], [place] [address]  Gastrointestinal Panel by  PCR , Stool     Status: None   Collection Time: 10/28/18  1:25 AM  Result Value Ref Range   Campylobacter species NOT DETECTED NOT DETECTED   Plesimonas shigelloides NOT DETECTED NOT DETECTED   Salmonella species NOT DETECTED NOT DETECTED   Yersinia enterocolitica NOT DETECTED NOT DETECTED   Vibrio species NOT DETECTED NOT DETECTED   Vibrio cholerae NOT DETECTED NOT DETECTED   Enteroaggregative E coli (EAEC) NOT DETECTED NOT DETECTED   Enteropathogenic E coli (EPEC) NOT DETECTED NOT DETECTED   Enterotoxigenic E coli (ETEC) NOT DETECTED NOT  DETECTED   Shiga like toxin producing E coli (STEC) NOT DETECTED NOT DETECTED   Shigella/Enteroinvasive E coli (EIEC) NOT DETECTED NOT DETECTED   Cryptosporidium NOT DETECTED NOT DETECTED   Cyclospora cayetanensis NOT DETECTED NOT DETECTED   Entamoeba histolytica NOT DETECTED NOT DETECTED   Giardia lamblia NOT DETECTED NOT DETECTED   Adenovirus F40/41 NOT DETECTED NOT DETECTED   Astrovirus NOT DETECTED NOT DETECTED   Norovirus GI/GII NOT DETECTED NOT DETECTED   Rotavirus A NOT DETECTED NOT DETECTED   Sapovirus (I, II, IV, and V) NOT DETECTED NOT DETECTED    Comment: Performed at [facility], [address] [place], [place] [address]  Urinalysis, Complete w Microscopic     Status: Abnormal   Collection Time: 10/28/18  4:19 AM  Result Value Ref Range   Color, Urine YELLOW (A) YELLOW   APPearance CLEAR (A) CLEAR   Specific Gravity, Urine 1.014 1.005 - 1.030   pH 7.0 5.0 - 8.0   Glucose, UA NEGATIVE NEGATIVE mg/dL   Hgb urine dipstick NEGATIVE NEGATIVE   Bilirubin Urine NEGATIVE NEGATIVE   Ketones, ur NEGATIVE NEGATIVE mg/dL   Protein, ur NEGATIVE NEGATIVE mg/dL   Nitrite NEGATIVE NEGATIVE   Leukocytes, UA SMALL (A) NEGATIVE   RBC / HPF 0-5 0 - 5 RBC/hpf   WBC, UA 11-20 0 - 5 WBC/hpf   Bacteria, UA RARE (A) NONE SEEN   Squamous Epithelial / LPF 0-5 0 - 5   Mucus PRESENT     Comment: Performed at [facility], [address], [place], [place] [address]  Urine Culture     Status: Abnormal   Collection Time: 10/28/18  4:19 AM  Result Value Ref Range   Specimen Description      URINE, CLEAN CATCH Performed at [facility], [address] [place], [place] [address]    Special Requests      Normal Performed at [facility], [address] [place],  [address]    Culture >=100,000 COLONIES/mL ENTEROCOCCUS FAECALIS (A)    Report Status 10/30/2018 FINAL    Organism ID, Bacteria ENTEROCOCCUS FAECALIS (A)       Susceptibility    Enterococcus faecalis - MIC*    AMPICILLIN <=2 SENSITIVE Sensitive     LEVOFLOXACIN >=8 RESISTANT Resistant     NITROFURANTOIN <=16 SENSITIVE Sensitive     VANCOMYCIN 1 SENSITIVE Sensitive     * >=100,000 COLONIES/mL ENTEROCOCCUS FAECALIS  Glucose, capillary     Status: None   Collection Time: 11/11/18  6:51 AM  Result Value Ref Range   Glucose-Capillary 98 70 - 99 mg/dL    Radiology Ct Abdomen Pelvis W Contrast  Result Date: 10/28/2018 CLINICAL DATA:  Diarrhea and abdominal pain. EXAM: CT ABDOMEN AND PELVIS WITH CONTRAST TECHNIQUE: Multidetector CT imaging of the abdomen and pelvis was performed using the standard protocol following bolus administration of intravenous contrast. CONTRAST:  111mL  ISOVUE-300 IOPAMIDOL (ISOVUE-300) INJECTION 61% COMPARISON:  CT abdomen pelvis 04/08/2017 FINDINGS: LOWER CHEST: There is no basilar pleural or apical pericardial effusion. Moderate cardiomegaly. HEPATOBILIARY: The hepatic contours and density are normal. There is no intra- or extrahepatic biliary dilatation. The gallbladder is normal. PANCREAS: The pancreatic parenchymal contours are normal and there is no ductal dilatation. There is no peripancreatic fluid collection. SPLEEN: Normal. ADRENALS/URINARY TRACT: --Adrenal glands: Normal. --Right kidney/ureter: No hydronephrosis, nephroureterolithiasis, perinephric stranding or solid renal mass. --Left kidney/ureter: Stones at the renal pelvis measure up to 10 mm. No hydronephrosis or ureteral stone. --Urinary bladder: Moderately distended. STOMACH/BOWEL: --Stomach/Duodenum: Gastrostomy tube with tip in the gastric lumen. --Small bowel: No dilatation or inflammation. --Colon: No focal abnormality. --Appendix: Surgically absent. VASCULAR/LYMPHATIC: There is aortic atherosclerosis without hemodynamically significant stenosis. There is an SMA stent.no abdominal or pelvic lymphadenopathy. REPRODUCTIVE: Status post hysterectomy. No adnexal mass. MUSCULOSKELETAL.  Multilevel degenerative disc disease and facet arthrosis. No bony spinal canal stenosis. OTHER: None. IMPRESSION: 1. No acute abdominal or pelvic abnormality. 2. Moderate distention of the urinary bladder. 3. Stones within the left renal pelvis measuring up to 10 mm. No hydronephrosis or ureteral stone. 4. Aortic atherosclerosis (ICD10-I70.0). Extensive atherosclerosis of proximal segments of major abdominal aortic branches. 5. Moderate cardiomegaly. Electronically Signed   By: Ulyses Jarred M.D.   On: 10/28/2018 03:39    Assessment/Plan Hyperlipemia lipid control important in reducing the progression of atherosclerotic disease. Continue statin therapy   Type 2 diabetes mellitus with complication (HCC) blood glucose control important in reducing the progression of atherosclerotic disease. Also, involved in wound healing. On appropriate medications.   Hypertension blood pressure control important in reducing the progression of atherosclerotic disease. On appropriate oral medications.   Esophagitis, CMV (HCC) Has had to have repeated dilatations.  The stricture from this seems to be causing more of an issue that malperfusion, but certainly her visceral disease could be a contributing factor.  Has led to an esophageal stricture now requiring a feeding tube  Esophageal stricture No longer eating.  Now has a feeding tube  Mesenteric ischemia ([place]) Her mesenteric duplex today shows somewhat improved velocities although still elevated in her SMA stent.  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  No signs of chronic visceral ischemia now.  Her esophageal stricture appeared to be causing the majority of the issues.  Plan to recheck in 6 months.    [name], MD  11/18/2018 9:28 AM    This note was created with Dragon medical transcription system.  Any errors from dictation are purely unintentional

## 2018-11-18 NOTE — Assessment & Plan Note (Signed)
No longer eating.  Now has a feeding tube

## 2018-11-18 NOTE — Assessment & Plan Note (Signed)
Her mesenteric duplex today shows somewhat improved velocities although still elevated in her SMA stent.  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  No signs of chronic visceral ischemia now.  Her esophageal stricture appeared to be causing the majority of the issues.  Plan to recheck in 6 months.

## 2018-11-21 ENCOUNTER — Emergency Department
Admission: EM | Admit: 2018-11-21 | Discharge: 2018-11-21 | Disposition: A | Discharge status: Home / Self Care | Payer: Medicare Other | Attending: Emergency Medicine | Admitting: Emergency Medicine

## 2018-11-21 ENCOUNTER — Other Ambulatory Visit: Payer: Self-pay

## 2018-11-21 ENCOUNTER — Encounter: Payer: Self-pay | Admitting: Emergency Medicine

## 2018-11-21 DIAGNOSIS — I10 Essential (primary) hypertension: Secondary | ICD-10-CM | POA: Insufficient documentation

## 2018-11-21 DIAGNOSIS — Z431 Encounter for attention to gastrostomy: Secondary | ICD-10-CM | POA: Diagnosis not present

## 2018-11-21 DIAGNOSIS — Z7984 Long term (current) use of oral hypoglycemic drugs: Secondary | ICD-10-CM | POA: Diagnosis not present

## 2018-11-21 DIAGNOSIS — L03311 Cellulitis of abdominal wall: Secondary | ICD-10-CM | POA: Diagnosis not present

## 2018-11-21 DIAGNOSIS — Z79899 Other long term (current) drug therapy: Secondary | ICD-10-CM | POA: Diagnosis not present

## 2018-11-21 DIAGNOSIS — L539 Erythematous condition, unspecified: Secondary | ICD-10-CM | POA: Diagnosis present

## 2018-11-21 DIAGNOSIS — E119 Type 2 diabetes mellitus without complications: Secondary | ICD-10-CM | POA: Insufficient documentation

## 2018-11-21 LAB — CBC WITH DIFFERENTIAL/PLATELET
Abs Immature Granulocytes: 0.02 10*3/uL (ref 0.00–0.07)
Basophils Absolute: 0 10*3/uL (ref 0.0–0.1)
Basophils Relative: 1 %
Eosinophils Absolute: 0.7 10*3/uL — ABNORMAL HIGH (ref 0.0–0.5)
Eosinophils Relative: 11 %
HCT: 31.7 % — ABNORMAL LOW (ref 36.0–46.0)
Hemoglobin: 10.2 g/dL — ABNORMAL LOW (ref 12.0–15.0)
Immature Granulocytes: 0 %
Lymphocytes Relative: 16 %
Lymphs Abs: 1 10*3/uL (ref 0.7–4.0)
MCH: 28 pg (ref 26.0–34.0)
MCHC: 32.2 g/dL (ref 30.0–36.0)
MCV: 87.1 fL (ref 80.0–100.0)
Monocytes Absolute: 0.4 10*3/uL (ref 0.1–1.0)
Monocytes Relative: 6 %
Neutro Abs: 4.2 10*3/uL (ref 1.7–7.7)
Neutrophils Relative %: 66 %
Platelets: 323 10*3/uL (ref 150–400)
RBC: 3.64 MIL/uL — ABNORMAL LOW (ref 3.87–5.11)
RDW: 15 % (ref 11.5–15.5)
WBC: 6.4 10*3/uL (ref 4.0–10.5)
nRBC: 0 % (ref 0.0–0.2)

## 2018-11-21 LAB — BASIC METABOLIC PANEL
Anion gap: 7 (ref 5–15)
BUN: 31 mg/dL — ABNORMAL HIGH (ref 8–23)
CO2: 31 mmol/L (ref 22–32)
Calcium: 8.7 mg/dL — ABNORMAL LOW (ref 8.9–10.3)
Chloride: 97 mmol/L — ABNORMAL LOW (ref 98–111)
Creatinine, Ser: 0.81 mg/dL (ref 0.44–1.00)
GFR calc Af Amer: >60 mL/min (ref >60)
GFR calc non Af Amer: >60 mL/min (ref >60)
Glucose, Bld: 106 mg/dL — ABNORMAL HIGH (ref 70–99)
Potassium: 3.9 mmol/L (ref 3.5–5.1)
Sodium: 135 mmol/L (ref 135–145)

## 2018-11-21 MED ORDER — CLINDAMYCIN PALMITATE HCL 75 MG/5ML PO SOLR: 300.0000 mg | Freq: Three times a day (TID) | ORAL | 0 refills | Status: AC

## 2018-11-21 MED ORDER — BACITRACIN ZINC 500 UNIT/GM EX OINT
TOPICAL_OINTMENT | Freq: Once | CUTANEOUS | Status: AC
  Administered 2018-11-21: 1 via TOPICAL
  Filled 2018-11-21: qty 0.9

## 2018-11-21 MED ORDER — CLINDAMYCIN PALMITATE HCL 75 MG/5ML PO SOLR
300.0000 mg | Freq: Once | ORAL | Status: AC
  Administered 2018-11-21: 300 mg
  Filled 2018-11-21: qty 20

## 2018-11-21 MED ORDER — BACITRACIN ZINC 500 UNIT/GM EX OINT: TOPICAL_OINTMENT | CUTANEOUS | 0 refills | Status: AC

## 2018-11-21 NOTE — ED Notes (Signed)
Pt presents to ED via POV with c/o purulent green drainage from gastrostomy tube. Pt states drainage started this morning, presents with small amounts of green drainage on bandage she placed PTA. Pt also c/o redness, MASD noted around site, site is also noted to be moist at this time. Pt with otherwise no complaints.

## 2018-11-21 NOTE — ED Triage Notes (Signed)
Patient has G-tube (placed 06/11/2018).  Arrives today with c/o greenish drainage from site today.  Also has been c/o pain to site.

## 2018-11-21 NOTE — ED Notes (Signed)
NAD noted at time of D/C. Pt denies questions or concerns. Pt taken to the lobby via wheelchair at this time.  

## 2018-11-21 NOTE — ED Provider Notes (Signed)
[facility] Emergency Department Provider Note  ____________________________________________  Time seen: Approximately 2:22 PM  I have reviewed the triage vital signs and the nursing notes.   HISTORY  Chief Complaint medical device problem   HPI [name] Day is a 85 y.o. female who presents for evaluation of cellulitis surrounding her G-tube.  Patient has had her G-tube since September 2019.  All her meals and medications are given through the G-tube.  This morning she noticed redness of the skin surrounding the G-tube site with small amount of green discharge in her dressing.  No fever or chills, no nausea or vomiting, her sugars at home have been well controlled.  She has no abdominal pain.  G-tube is working properly.  Past Medical History:  Diagnosis Date  . Anemia    IRON INFUSIONS  . Aortic valve sclerosis   . Arrhythmia   . Arthritis    osteoarthritis  . Basal cell carcinoma of skin   . Brain tumor ([place])   . Brain tumor ([place])   . Cervical spine disease   . Diabetes mellitus without complication ([place])   . Dysrhythmia    sinus arrhythmia  . Esophageal stricture    severe, led to feeding tube placement in Sept 2019  . Esophageal ulcer without bleeding   . Gastrostomy tube dependent ([place])    DOES NOT EAT OR DRINK   . GERD (gastroesophageal reflux disease)   . History of kidney stones   . Hyperlipemia   . Hypertension   . Kidney stones   . Leaky heart valve   . Meningioma ([place])   . 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 Pulmonary hypertension ([place])   . RLS (restless legs syndrome)   . Vertigo     Patient Active Problem List   Diagnosis Date Noted  . Hyponatremia 06/23/2018  . Protein-calorie malnutrition, severe 06/12/2018  . Esophageal stricture 06/11/2018  . Weight loss 05/29/2018  . Dysphagia 05/29/2018  . Esophageal stenosis 05/29/2018  . Hyperlipemia 04/04/2018  . Itching 01/08/2018  . Medication monitoring encounter  01/08/2018  . Paronychia and onychomycosis great toe, right  01/08/2018  . Esophagitis, CMV ([place]) 01/01/2018  . Hypertension 04/30/2017  . Mesenteric ischemia ([place]) 03/01/2017  . Type 2 diabetes mellitus with complication ([place]) 03/47/4259  . Iron deficiency anemia 02/28/2017  . PAD (peripheral artery disease) ([place]) 02/05/2017    Past Surgical History:  Procedure Laterality Date  . ABDOMINAL HYSTERECTOMY    . APPENDECTOMY    . ARTHROGRAM KNEE Left   . BACK SURGERY     CERVIVAL NECK FUSION  . CATARACT EXTRACTION W/PHACO Left 11/11/2018   Procedure: CATARACT EXTRACTION PHACO AND INTRAOCULAR LENS PLACEMENT (IOC) LEFT, DIABETIC;  Surgeon: Birder Robson, MD;  Location: [facility] ORS;  Service: Ophthalmology;  Laterality: Left;  [place]  00:41 CDE 6.41 Fluid pack lot # [id number]  . COLONOSCOPY WITH PROPOFOL N/A 06/20/2017   Procedure: COLONOSCOPY WITH PROPOFOL;  Surgeon: Lollie Sails, MD;  Location: [facility] ENDOSCOPY;  Service: Endoscopy;  Laterality: N/A;  . ESOPHAGOGASTRODUODENOSCOPY (EGD) WITH PROPOFOL N/A 03/14/2015   Procedure: ESOPHAGOGASTRODUODENOSCOPY (EGD) WITH PROPOFOL;  Surgeon: Josefine Class, MD;  Location: [facility] ENDOSCOPY;  Service: Endoscopy;  Laterality: N/A;  . ESOPHAGOGASTRODUODENOSCOPY (EGD) WITH PROPOFOL N/A 03/28/2017   Procedure: ESOPHAGOGASTRODUODENOSCOPY (EGD) WITH PROPOFOL;  Surgeon: Lollie Sails, MD;  Location: [facility] ENDOSCOPY;  Service: Endoscopy;  Laterality: N/A;  . ESOPHAGOGASTRODUODENOSCOPY (EGD) WITH PROPOFOL N/A 06/20/2017   Procedure: ESOPHAGOGASTRODUODENOSCOPY (EGD) WITH PROPOFOL;  Surgeon: Lollie Sails, MD;  Location: [facility] ENDOSCOPY;  Service: Endoscopy;  Laterality: N/A;  . ESOPHAGOGASTRODUODENOSCOPY (EGD) WITH PROPOFOL N/A 10/21/2017   Procedure: ESOPHAGOGASTRODUODENOSCOPY (EGD) WITH PROPOFOL;  Surgeon: Lollie Sails, MD;  Location: [facility] END[facility]COPY;  Service: Endoscopy;  Laterality: N/A;  . ESOPHAGOGASTRODUODENOSCOPY (EGD) WITH PROPOFOL N/A  04/15/2018   Procedure: ESOPHAGOGASTRODUODENOSCOPY (EGD) WITH PROPOFOL;  Surgeon: Lollie Sails, MD;  Location: [facility] ENDOSCOPY;  Service: Endoscopy;  Laterality: N/A;  . ESOPHAGUS SURGERY     CLOSURE  . EYE SURGERY    . HYSTERECTOMY ABDOMINAL WITH SALPINGECTOMY    . IR GASTROSTOMY TUBE MOD SED  06/11/2018  . PERIPHERAL VASCULAR CATHETERIZATION N/A 01/23/2016   Procedure: Visceral Venography;  Surgeon: Algernon Huxley, MD;  Location: [place] CV LAB;  Service: Cardiovascular;  Laterality: N/A;  . PERIPHERAL VASCULAR CATHETERIZATION  01/23/2016   Procedure: Peripheral Vascular Intervention;  Surgeon: Algernon Huxley, MD;  Location: [place] CV LAB;  Service: Cardiovascular;;  . UPPER ESOPHAGEAL ENDOSCOPIC ULTRASOUND (EUS) N/A 12/05/2017   Procedure: UPPER ESOPHAGEAL ENDOSCOPIC ULTRASOUND (EUS);  Surgeon: Jola Schmidt, MD;  Location: [facility] ENDOSCOPY;  Service: Endoscopy;  Laterality: N/A;  . VISCERAL ANGIOGRAPHY N/A 03/18/2017   Procedure: Visceral Angiography;  Surgeon: Algernon Huxley, MD;  Location: [place] CV LAB;  Service: Cardiovascular;  Laterality: N/A;  . VISCERAL ARTERY INTERVENTION N/A 03/18/2017   Procedure: Visceral Artery Intervention;  Surgeon: Algernon Huxley, MD;  Location: [place] CV LAB;  Service: Cardiovascular;  Laterality: N/A;    Prior to Admission medications   Medication Sig Start Date End Date Taking? Authorizing Provider  acetaminophen (TYLENOL) 160 MG/5ML solution Place 20.3 mLs (650 mg total) into feeding tube every 6 (six) hours as needed for mild pain. Patient taking differently: Place 960 mg into feeding tube every 6 (six) hours as needed for mild pain or headache.  06/13/18   Demetrios Loll, MD  allopurinol (ZYLOPRIM) 100 MG tablet Place 100 mg into feeding tube daily.    [provider]  Amino Acids-Protein Hydrolys (FEEDING SUPPLEMENT, PRO-STAT SUGAR FREE 64,) LIQD Place 30 mLs into feeding tube daily. 06/13/18   Demetrios Loll, MD  bacitracin ointment  Apply to affected area twice a day 11/21/18 11/21/19  Rudene Re, MD  clindamycin (CLEOCIN) 75 MG/5ML solution Take 20 mLs (300 mg total) by mouth 3 (three) times daily for 10 days. 11/21/18 12/01/18  Rudene Re, MD  clopidogrel (PLAVIX) 75 MG tablet TAKE 1 TABLET BY MOUTH ONCE DAILY Patient taking differently: Take 75 mg by mouth daily.  10/07/18   Algernon Huxley, MD  glipiZIDE (GLUCOTROL) 5 MG tablet Place 1 tablet (5 mg total) into feeding tube daily before breakfast. 06/13/18   Demetrios Loll, MD  hydrALAZINE (APRESOLINE) 50 MG tablet Place 1 tablet (50 mg total) into feeding tube 2 (two) times daily. 06/13/18   Demetrios Loll, MD  hydrochlorothiazide (HYDRODIURIL) 25 MG tablet Take 25 mg by mouth daily. 07/21/18   [provider]  Hypromellose (GENTEAL MILD) 0.2 % SOLN Place 1 drop into both eyes 3 (three) times daily.    [provider]  lisinopril (PRINIVIL,ZESTRIL) 10 MG tablet Place 1 tablet (10 mg total) into feeding tube daily. 06/26/18   Dustin Flock, MD  loperamide (IMODIUM) 1 MG/5ML solution Take 3 mg by mouth as needed for diarrhea or loose stools.    [provider]  metoprolol tartrate (LOPRESSOR) 25 MG tablet Place 12.5 mg into feeding tube daily.  07/08/18  [provider]  Multiple Vitamin (MULTIVITAMIN) LIQD Place 15 mLs into feeding tube daily. 06/13/18   Demetrios Loll, MD  Nutritional Supplements (FEEDING SUPPLEMENT, JEVITY 1.5 CAL/FIBER,) LIQD Place 237 mLs into feeding tube 4 (four) times daily. 06/25/18   Dustin Flock, MD  pantoprazole sodium (PROTONIX) 40 mg/20 mL PACK Place 20 mLs (40 mg total) into feeding tube 2 (two) times daily. 06/25/18   Dustin Flock, MD  rOPINIRole (REQUIP) 2 MG tablet Place 1 tablet (2 mg total) into feeding tube 2 (two) times daily. 06/13/18   Demetrios Loll, MD  simethicone [facility]) 40 RD/4.0CX drops Take 1.2 mLs (80 mg total) by mouth 4 (four) times daily as needed for flatulence. If unable to tolerate orally, may  administer via PEG tube. Patient not taking: Reported on 10/30/2018 09/22/18   Karen Kitchens, NP  vitamin C (VITAMIN C) 250 MG tablet Place 1 tablet (250 mg total) into feeding tube 2 (two) times daily. 06/13/18   Demetrios Loll, MD  Water For Irrigation, Sterile (FREE WATER) SOLN Place 30 mLs into feeding tube every 4 (four) hours. 06/25/18   Dustin Flock, MD    Allergies Gabapentin; Lipitor [atorvastatin]; Mevacor [lovastatin]; Milk-related compounds; Septra [sulfamethoxazole-trimethoprim]; Statins; Sulfur; and Zocor [simvastatin]  Family History  Problem Relation Age of Onset  . Stroke Mother   . Hypertension Mother   . Cancer Father   . Heart attack Sister   . Diabetes Sister   . Heart attack Brother     Social History Social History   Tobacco Use  . Smoking status: Never Smoker  . Smokeless tobacco: Never Used  Substance Use Topics  . Alcohol use: Never    Frequency: Never  . Drug use: Never    Review of Systems  Constitutional: Negative for fever. Eyes: Negative for visual changes. ENT: Negative for sore throat. Neck: No neck pain  Cardiovascular: Negative for chest pain. Respiratory: Negative for shortness of breath. Gastrointestinal: Negative for abdominal pain, vomiting or diarrhea. Genitourinary: Negative for dysuria. Musculoskeletal: Negative for back pain. Skin: Negative for rash. + redness and discharge surrounding G-tube site Neurological: Negative for headaches, weakness or numbness. Psych: No SI or HI  ____________________________________________   PHYSICAL EXAM:  VITAL SIGNS: ED Triage Vitals  Enc Vitals Group     BP 11/21/18 1327 135/90     Pulse Rate 11/21/18 1327 (!) 55     Resp 11/21/18 1327 20     Temp 11/21/18 1327 98.1 F (36.7 C)     Temp Source 11/21/18 1327 Oral     SpO2 11/21/18 1327 100 %     Weight 11/21/18 1314 118 lb (53.5 kg)     Height 11/21/18 1314 5\' 3"  (1.6 m)     Head Circumference --      Peak Flow --      Pain Score  11/21/18 1314 0     Pain Loc --      Pain Edu? --      Excl. in [place]? --     Constitutional: Alert and oriented. Well appearing and in no apparent distress. HEENT:      Head: Normocephalic and atraumatic.         Eyes: Conjunctivae are normal. Sclera is non-icteric.       Mouth/Throat: Mucous membranes are moist.       Neck: Supple with no signs of meningismus. Cardiovascular: Regular rate and rhythm. No murmurs, gallops, or rubs. 2+ symmetrical distal pulses are present in all extremities. No  JVD. Respiratory: Normal respiratory effort. Lungs are clear to auscultation bilaterally. No wheezes, crackles, or rhonchi.  Gastrointestinal: Soft, non tender, and non distended with positive bowel sounds. No rebound or guarding. Musculoskeletal: Nontender with normal range of motion in all extremities. No edema, cyanosis, or erythema of extremities. Neurologic: Normal speech and language. Face is symmetric. Moving all extremities. No gross focal neurologic deficits are appreciated. Skin: Skin is warm, dry and intact.  There is small amount of surrounding erythema which is warm to the touch around the site of insertion of the G-tube.  I do see a very small amount of pus on the dressing.  No fluctuance or abscess. Psychiatric: Mood and affect are normal. Speech and behavior are normal.  ____________________________________________   LABS (all labs ordered are listed, but only abnormal results are displayed)  Labs Reviewed  CBC WITH DIFFERENTIAL/PLATELET - Abnormal; Notable for the following components:      Result Value   RBC 3.64 (*)    Hemoglobin 10.2 (*)    HCT 31.7 (*)    Eosinophils Absolute 0.7 (*)    All other components within normal limits  BASIC METABOLIC PANEL - Abnormal; Notable for the following components:   Chloride 97 (*)    Glucose, Bld 106 (*)    BUN 31 (*)    Calcium 8.7 (*)    All other components within normal limits    ____________________________________________  EKG  none  ____________________________________________  RADIOLOGY  none  ____________________________________________   PROCEDURES  Procedure(s) performed: None Procedures Critical Care performed:  None ____________________________________________   INITIAL IMPRESSION / ASSESSMENT AND PLAN / ED COURSE   85 y.o. female who presents for evaluation of cellulitis surrounding her G-tube.  No signs of sepsis, normal vital signs, normal white count, normal blood glucose with no signs of DKA.  Patient was given a dose of liquid clindamycin through the PEG tube and will be discharged home on liquid clindamycin and topical bacitracin.  Recommended follow-up with primary care doctor.  Discussed standard return precautions.      As part of my medical decision making, I reviewed the following data within the [place] notes reviewed and incorporated, Labs reviewed , Old chart reviewed, Notes from prior ED visits and [place] Controlled Substance Database    Pertinent labs & imaging results that were available during my care of the patient were reviewed by me and considered in my medical decision making (see chart for details).    ____________________________________________   FINAL CLINICAL IMPRESSION(S) / ED DIAGNOSES  Final diagnoses:  Cellulitis of abdominal wall      NEW MEDICATIONS STARTED DURING THIS VISIT:  ED Discharge Orders         Ordered    clindamycin (CLEOCIN) 75 MG/5ML solution  3 times daily     11/21/18 1422    bacitracin ointment     11/21/18 1422           Note:  This document was prepared using Dragon voice recognition software and may include unintentional dictation errors.    Alfred Levins, [place], MD 11/21/18 1430

## 2018-11-25 ENCOUNTER — Inpatient Hospital Stay: Payer: Medicare Other | Attending: Hematology and Oncology

## 2018-11-25 DIAGNOSIS — R634 Abnormal weight loss: Secondary | ICD-10-CM | POA: Insufficient documentation

## 2018-11-25 DIAGNOSIS — K5730 Diverticulosis of large intestine without perforation or abscess without bleeding: Secondary | ICD-10-CM | POA: Insufficient documentation

## 2018-11-25 DIAGNOSIS — D509 Iron deficiency anemia, unspecified: Secondary | ICD-10-CM | POA: Insufficient documentation

## 2018-11-25 DIAGNOSIS — D508 Other iron deficiency anemias: Secondary | ICD-10-CM

## 2018-11-25 LAB — CBC WITH DIFFERENTIAL/PLATELET
Abs Immature Granulocytes: 0.01 10*3/uL (ref 0.00–0.07)
Basophils Absolute: 0 10*3/uL (ref 0.0–0.1)
Basophils Relative: 1 %
Eosinophils Absolute: 0.3 10*3/uL (ref 0.0–0.5)
Eosinophils Relative: 6 %
HCT: 29.8 % — ABNORMAL LOW (ref 36.0–46.0)
Hemoglobin: 9.8 g/dL — ABNORMAL LOW (ref 12.0–15.0)
Immature Granulocytes: 0 %
Lymphocytes Relative: 17 %
Lymphs Abs: 0.8 10*3/uL (ref 0.7–4.0)
MCH: 28.7 pg (ref 26.0–34.0)
MCHC: 32.9 g/dL (ref 30.0–36.0)
MCV: 87.1 fL (ref 80.0–100.0)
Monocytes Absolute: 0.3 10*3/uL (ref 0.1–1.0)
Monocytes Relative: 6 %
Neutro Abs: 3.2 10*3/uL (ref 1.7–7.7)
Neutrophils Relative %: 70 %
Platelets: 292 10*3/uL (ref 150–400)
RBC: 3.42 MIL/uL — ABNORMAL LOW (ref 3.87–5.11)
RDW: 15 % (ref 11.5–15.5)
WBC: 4.6 10*3/uL (ref 4.0–10.5)
nRBC: 0 % (ref 0.0–0.2)

## 2018-11-25 LAB — FERRITIN: Ferritin: 45 ng/mL (ref 11–307)

## 2018-11-26 ENCOUNTER — Encounter: Payer: Self-pay | Admitting: Hematology and Oncology

## 2018-11-26 ENCOUNTER — Inpatient Hospital Stay: Payer: Medicare Other

## 2018-11-26 ENCOUNTER — Telehealth: Payer: Self-pay

## 2018-11-26 ENCOUNTER — Inpatient Hospital Stay: Payer: Medicare Other | Attending: Hematology and Oncology | Admitting: Hematology and Oncology

## 2018-11-26 VITALS — BP 146/46 | HR 46 | Temp 97.3°F | Resp 18 | Ht 63.0 in | Wt 115.5 lb

## 2018-11-26 VITALS — BP 164/35 | Resp 18

## 2018-11-26 DIAGNOSIS — R634 Abnormal weight loss: Secondary | ICD-10-CM

## 2018-11-26 DIAGNOSIS — K5730 Diverticulosis of large intestine without perforation or abscess without bleeding: Secondary | ICD-10-CM | POA: Diagnosis not present

## 2018-11-26 DIAGNOSIS — D509 Iron deficiency anemia, unspecified: Secondary | ICD-10-CM | POA: Diagnosis not present

## 2018-11-26 DIAGNOSIS — D508 Other iron deficiency anemias: Secondary | ICD-10-CM

## 2018-11-26 DIAGNOSIS — R197 Diarrhea, unspecified: Secondary | ICD-10-CM

## 2018-11-26 LAB — C DIFFICILE QUICK SCREEN W PCR REFLEX
C Diff antigen: NEGATIVE
C Diff interpretation: NOT DETECTED
C Diff toxin: NEGATIVE

## 2018-11-26 MED ORDER — IRON SUCROSE 20 MG/ML IV SOLN
200.0000 mg | Freq: Once | INTRAVENOUS | Status: AC
  Administered 2018-11-26: 200 mg via INTRAVENOUS

## 2018-11-26 MED ORDER — SODIUM CHLORIDE 0.9 % IV SOLN
Freq: Once | INTRAVENOUS | Status: AC
  Administered 2018-11-26: 15:00:00 via INTRAVENOUS
  Filled 2018-11-26: qty 250

## 2018-11-26 MED ORDER — SODIUM CHLORIDE 0.9 % IV SOLN: 200.0000 mg | INTRAVENOUS | Status: DC

## 2018-11-26 NOTE — Progress Notes (Signed)
[name] is a 85 y.o. female with iron deficiency anemia who is seen for 3 month assessment.  HPI:   The patient was last seen in the hematology clinic on 08/22/2018.  At that time, she was doing "better", but felt tired.  Exam was stable. Hemoglobin was 8.9.  Ferritin was 36.  She received Venofer x 2 (08/22/2018 and 09/01/2018).  CBC has been followed: 09/22/2018:  hematocrit 29.7, hemoglobin 9.3, MCV 87.9.  Ferritin 80. 10/27/2018:  hematocrit 33.7, hemoglobin 10.5, MCV 88.5. 11/21/2018:  hematocrit 31.7, hemoglobin 10.2, MCV 87.1.  Creatinine 0.81. 11/24/2018:  hematocrit 29.8, hemoglobin 9.8, MCV 87.1.  Ferritin 45.  She was seen in the ER on 11/21/2018 with cellulitis around her PEG tube.  She received clindamycin and topical bacitracin.  During the interim, patient has chronic diarrhea. She notes that it has significantly increased with the oral clindamycin that she is currently taking for cellulitis. Patient advising that she has a couple of episodes of diarrhea in her bed last night.  Patient has taken several doses of loperamide today, however diarrhea persists.  Of note, cellulitis has improved. She was seen in consult by GI Jacqulyn Liner, NP) earlier today. Patient was referred to wound care.   Patient complains of being weak and fatigued. She states, "When I sit down for any length of time, I will fall asleep. That is not me. I am always so tired". Patient denies bleeding; no hematochezia, melena, or gross hematuria.   Patient advises that she maintains an adequate appetite. She is eating well. Weight today is 115 lb 8.3 oz (52.4 kg), which compared to her last visit to the clinic, represents a 3 pound decrease.     Patient denies pain in the clinic today.   Past Medical History:  Diagnosis Date  . Anemia    IRON INFUSIONS  . Aortic valve sclerosis   . Arrhythmia   . Arthritis     osteoarthritis  . Basal cell carcinoma of skin   . Brain tumor ([place])   . Brain tumor ([place])   . Cervical spine disease   . Diabetes mellitus without complication ([place])   . Dysrhythmia    sinus arrhythmia  . Esophageal stricture    severe, led to feeding tube placement in Sept 2019  . Esophageal ulcer without bleeding   . Gastrostomy tube dependent ([place])    DOES NOT EAT OR DRINK   . GERD (gastroesophageal reflux disease)   . History of kidney stones   . Hyperlipemia   . Hypertension   . Kidney stones   . Leaky heart valve   . Meningioma ([place])   . 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 Pulmonary hypertension ([place])   . RLS (restless legs syndrome)   . Vertigo     Past Surgical History:  Procedure Laterality Date  . ABDOMINAL HYSTERECTOMY    . APPENDECTOMY    . ARTHROGRAM KNEE Left   . BACK SURGERY     CERVIVAL NECK FUSION  . CATARACT EXTRACTION W/PHACO Left 11/11/2018   Procedure: CATARACT EXTRACTION PHACO AND INTRAOCULAR LENS PLACEMENT (IOC) LEFT, DIABETIC;  Surgeon: Birder Robson, MD;  Location: [facility] ORS;  Service: Ophthalmology;  Laterality: Left;  [place]  00:41 CDE 6.41 Fluid pack lot # [id number]  . COLONOSCOPY WITH PROPOFOL N/A 06/20/2017   Procedure: COLONOSCOPY WITH PROPOFOL;  Surgeon: Lollie Sails, MD;  Location: [facility] ENDOSCOPY;  Service: Endoscopy;  Laterality: N/A;  . ESOPHAGOGASTRODUODENOSCOPY (EGD) WITH PROPOFOL N/A 03/14/2015   Procedure: ESOPHAGOGASTRODUODENOSCOPY (EGD) WITH PROPOFOL;  Surgeon: Josefine Class, MD;  Location: [facility] ENDOSCOPY;  Service: Endoscopy;  Laterality: N/A;  . ESOPHAGOGASTRODUODENOSCOPY (EGD) WITH PROPOFOL N/A 03/28/2017   Procedure: ESOPHAGOGASTRODUODENOSCOPY (EGD) WITH PROPOFOL;  Surgeon: Lollie Sails, MD;  Location: [facility] ENDOSCOPY;  Service: Endoscopy;  Laterality: N/A;  . ESOPHAGOGASTRODUODENOSCOPY (EGD) WITH PROPOFOL N/A 06/20/2017   Procedure: ESOPHAGOGASTRODUODENOSCOPY (EGD) WITH PROPOFOL;  Surgeon: Lollie Sails,  MD;  Location: [facility] ENDOSCOPY;  Service: Endoscopy;  Laterality: N/A;  . ESOPHAGOGASTRODUODENOSCOPY (EGD) WITH PROPOFOL N/A 10/21/2017   Procedure: ESOPHAGOGASTRODUODENOSCOPY (EGD) WITH PROPOFOL;  Surgeon: Lollie Sails, MD;  Location: [facility] ENDOSCOPY;  Service: Endoscopy;  Laterality: N/A;  . ESOPHAGOGASTRODUODENOSCOPY (EGD) WITH PROPOFOL N/A 04/15/2018   Procedure: ESOPHAGOGASTRODUODENOSCOPY (EGD) WITH PROPOFOL;  Surgeon: Lollie Sails, MD;  Location: [facility] ENDOSCOPY;  Service: Endoscopy;  Laterality: N/A;  . ESOPHAGUS SURGERY     CLOSURE  . EYE SURGERY    . HYSTERECTOMY ABDOMINAL WITH SALPINGECTOMY    . IR GASTROSTOMY TUBE MOD SED  06/11/2018  . PERIPHERAL VASCULAR CATHETERIZATION N/A 01/23/2016   Procedure: Visceral Venography;  Surgeon: Algernon Huxley, MD;  Location: [place] CV LAB;  Service: Cardiovascular;  Laterality: N/A;  . PERIPHERAL VASCULAR CATHETERIZATION  01/23/2016   Procedure: Peripheral Vascular Intervention;  Surgeon: Algernon Huxley, MD;  Location: [place] CV LAB;  Service: Cardiovascular;;  . UPPER ESOPHAGEAL ENDOSCOPIC ULTRASOUND (EUS) N/A 12/05/2017   Procedure: UPPER ESOPHAGEAL ENDOSCOPIC ULTRASOUND (EUS);  Surgeon: Jola Schmidt, MD;  Location: [facility] ENDOSCOPY;  Service: Endoscopy;  Laterality: N/A;  . VISCERAL ANGIOGRAPHY N/A 03/18/2017   Procedure: Visceral Angiography;  Surgeon: Algernon Huxley, MD;  Location: [place] CV LAB;  Service: Cardiovascular;  Laterality: N/A;  . VISCERAL ARTERY INTERVENTION N/A 03/18/2017   Procedure: Visceral Artery Intervention;  Surgeon: Algernon Huxley, MD;  Location: [place] CV LAB;  Service: Cardiovascular;  Laterality: N/A;    Family History  Problem Relation Age of Onset  . Stroke Mother   . Hypertension Mother   . Cancer Father   . Heart attack Sister   . Diabetes Sister   . Heart attack Brother     Social History:  reports that she has never smoked. She has never used smokeless tobacco. She reports that she does  not drink alcohol or use drugs.  She denies any exposure to radiation or toxins.  She is retired.  She worked at [employer].  She lives in [place].  Patient's contact number is [phone].  Daughter's number is [phone] [name]).  The patient is accompanied by her daughter today.   Allergies:  Allergies  Allergen Reactions  . Gabapentin Swelling  . Lipitor [Atorvastatin] Other (See Comments)    Muscle aches  . Mevacor [Lovastatin] Other (See Comments)    Muscle aches  . Milk-Related Compounds Other (See Comments)    Large quantities cause headaches   . Septra [Sulfamethoxazole-Trimethoprim] Other (See Comments)    Unknown  . Statins Other (See Comments)    Muscle pain  . Sulfur Diarrhea and Nausea And Vomiting  . Zocor [Simvastatin] Other (See Comments)    Muscle aches    Current Medications: Current Outpatient Medications  Medication Sig Dispense Refill  . allopurinol (ZYLOPRIM) 100 MG tablet Place 100 mg into feeding tube daily.    . Amino Acids-Protein Hydrolys (FEEDING SUPPLEMENT, PRO-STAT SUGAR FREE 64,) LIQD Place 30  mLs into feeding tube daily. 450 mL 1  . bacitracin ointment Apply to affected area twice a [name] 30 g 0  . clindamycin (CLEOCIN) 75 MG/5ML solution Take 20 mLs (300 mg total) by mouth 3 (three) times daily for 10 days. 100 mL 0  . clopidogrel (PLAVIX) 75 MG tablet TAKE 1 TABLET BY MOUTH ONCE DAILY (Patient taking differently: Take 75 mg by mouth daily. ) 30 tablet 2  . glipiZIDE (GLUCOTROL) 5 MG tablet Place 1 tablet (5 mg total) into feeding tube daily before breakfast.    . hydrALAZINE (APRESOLINE) 50 MG tablet Place 1 tablet (50 mg total) into feeding tube 2 (two) times daily.    . hydrochlorothiazide (HYDRODIURIL) 25 MG tablet Take 25 mg by mouth daily.  1  . Hypromellose (GENTEAL MILD) 0.2 % SOLN Place 1 drop into both eyes 3 (three) times daily.    Marland Kitchen lisinopril (PRINIVIL,ZESTRIL) 10 MG tablet Place 1 tablet (10 mg total) into feeding tube  daily. 30 tablet 2  . loperamide (IMODIUM) 1 MG/5ML solution Take 3 mg by mouth as needed for diarrhea or loose stools.    . metoprolol tartrate (LOPRESSOR) 25 MG tablet Place 12.5 mg into feeding tube daily.   1  . Multiple Vitamin (MULTIVITAMIN) LIQD Place 15 mLs into feeding tube daily. 450 mL 1  . Nutritional Supplements (FEEDING SUPPLEMENT, JEVITY 1.5 CAL/FIBER,) LIQD Place 237 mLs into feeding tube 4 (four) times daily. 237 mL 90  . pantoprazole sodium (PROTONIX) 40 mg/20 mL PACK Place 20 mLs (40 mg total) into feeding tube 2 (two) times daily. 30 each 1  . rOPINIRole (REQUIP) 2 MG tablet Place 1 tablet (2 mg total) into feeding tube 2 (two) times daily. 60 tablet 1  . vitamin C (VITAMIN C) 250 MG tablet Place 1 tablet (250 mg total) into feeding tube 2 (two) times daily. 60 tablet 1  . Water For Irrigation, Sterile (FREE WATER) SOLN Place 30 mLs into feeding tube every 4 (four) hours.    Marland Kitchen acetaminophen (TYLENOL) 160 MG/5ML solution Place 20.3 mLs (650 mg total) into feeding tube every 6 (six) hours as needed for mild pain. (Patient not taking: Reported on 11/26/2018) 120 mL 0  . simethicone (MYLICON) 40 TM/5.4YT drops Take 1.2 mLs (80 mg total) by mouth 4 (four) times daily as needed for flatulence. If unable to tolerate orally, may administer via PEG tube. (Patient not taking: Reported on 10/30/2018) 90 mL 0   No current facility-administered medications for this visit.     Review of Systems:  GENERAL:  Feels weak and fatigued.  No fevers, sweats.  Weight down 3 pounds. PERFORMANCE STATUS (ECOG):  2 HEENT:  Chronic visual issues.  No runny nose, sore throat, mouth sores or tenderness. Lungs: No shortness of breath or cough.  No hemoptysis. Cardiac:  No chest pain, palpitations, orthopnea, or PND. GI:  Diarrhea.  No nausea, vomiting, constipation, melena or hematochezia. GU:  No urgency, frequency, dysuria, or hematuria. Musculoskeletal:  No back pain.  Arthritis.  No muscle  tenderness. Extremities:  No pain or swelling. Skin:  Cellulitis around G tube, improving.  No rashes or skin changes. Neuro:  No headache, numbness or weakness, balance or coordination issues. Endocrine:  No diabetes.  Thyroid disease on Synthroid.  No hot flashes or night sweats. Psych:  No mood changes, depression or anxiety. Pain:  No focal pain. Review of systems:  All other systems reviewed and found to be negative.   Physical Exam: Blood  pressure (!) 146/46, pulse (!) 46, temperature (!) 97.3 F (36.3 C), temperature source Tympanic, resp. rate 18, height 5\' 3"  (1.6 m), weight 115 lb 8.3 oz (52.4 kg), SpO2 100 %. GENERAL:  Thin elderly woman sitting comfortably in a wheelchair in the exam room in no acute distress. MENTAL STATUS:  Alert and oriented to person, place and time. HEAD:  Short gray hair.  Normocephalic, atraumatic, face symmetric, no Cushingoid features. EYES:  Glasses.  Hazel eyes.  Pupils equal round and reactive to light and accomodation.  No conjunctivitis or scleral icterus. ENT:  Oropharynx clear without lesion.  Tongue normal. Mucous membranes moist.  RESPIRATORY:  Clear to auscultation without rales, wheezes or rhonchi. CARDIOVASCULAR:  Regular rate and rhythm without murmur, rub or gallop. ABDOMEN:  Soft, non-tender, with active bowel sounds, and no hepatosplenomegaly.  No masses. SKIN:  Slight pinkness around G tube.  No purulence.  No rashes, ulcers or lesions. EXTREMITIES: No edema, no skin discoloration or tenderness.  No palpable cords. LYMPH NODES: No palpable cervical, supraclavicular, axillary or inguinal adenopathy  NEUROLOGICAL: Unremarkable. PSYCH:  Appropriate.    Appointment on 11/25/2018  Component Date Value Ref Range Status  . Ferritin 11/25/2018 45  11 - 307 ng/mL Final   Performed at [facility], [address] [place], [place] [address]  . WBC 11/25/2018 4.6  4.0 - 10.5 K/uL Final  . RBC 11/25/2018 3.42* 3.87 - 5.11 MIL/uL  Final  . Hemoglobin 11/25/2018 9.8* 12.0 - 15.0 g/dL Final  . HCT 11/25/2018 29.8* 36.0 - 46.0 % Final  . MCV 11/25/2018 87.1  80.0 - 100.0 fL Final  . MCH 11/25/2018 28.7  26.0 - 34.0 pg Final  . MCHC 11/25/2018 32.9  30.0 - 36.0 g/dL Final  . RDW 11/25/2018 15.0  11.5 - 15.5 % Final  . Platelets 11/25/2018 292  150 - 400 K/uL Final  . nRBC 11/25/2018 0.0  0.0 - 0.2 % Final  . Neutrophils Relative % 11/25/2018 70  % Final  . Neutro Abs 11/25/2018 3.2  1.7 - 7.7 K/uL Final  . Lymphocytes Relative 11/25/2018 17  % Final  . Lymphs Abs 11/25/2018 0.8  0.7 - 4.0 K/uL Final  . Monocytes Relative 11/25/2018 6  % Final  . Monocytes Absolute 11/25/2018 0.3  0.1 - 1.0 K/uL Final  . Eosinophils Relative 11/25/2018 6  % Final  . Eosinophils Absolute 11/25/2018 0.3  0.0 - 0.5 K/uL Final  . Basophils Relative 11/25/2018 1  % Final  . Basophils Absolute 11/25/2018 0.0  0.0 - 0.1 K/uL Final  . Immature Granulocytes 11/25/2018 0  % Final  . Abs Immature Granulocytes 11/25/2018 0.01  0.00 - 0.07 K/uL Final   Performed at [facility], [address], [place], [place] [address]    Assessment:  [name] is a 85 y.o. female with iron deficiency anemia.  She has been on oral iron x 1 year with continued decline in her counts.  She has dysphagia and odynophagia.  She is on  PPI and carafate.  Diet is modest.  She denies pica.  Abdomen and pelvic CT on 02/19/2017 revealed no acute abnormality.  She has mild sigmoid diverticulosis.  CBC on 02/26/2017 revealed a hematocrit of 25.3, hemoglobin 8.0, and MCV 83.2.  Hemoccult studies x 1 were positive in 11/19/2016 and negative x 2 in 01/2017.  Ferritin was 5 with an iron saturation of 4%, and a TIBC of 487 on 01/30/2017.  Normal studies  included:  creatinine (1.0), calcium, albumen, B12 (647), and TSH.  Folic acid was 40.8 on 08/09/2015.  Urinalysis revealed no blood on 01/23/2017.  SPEP was negative on 02/28/2017.  She received Venofer weekly  x 3 (02/28/2017 - 03/14/2017), x 2 (04/19/2017 - 04/26/2017),  x 2 (06/14/2017 and 06/21/2017), 11/21/2017, x 3 (01/09/2018- 01/23/2018), 02/20/2018, and x 2 (08/22/2018 - 09/01/2018).   Ferritin has been followed: 8 on 02/28/2017, 164 on 03/19/2017, 27 on 04/18/2017, 82 on 05/17/2017, 38 on 06/10/2017, 100 on 07/18/2017, 40 on 09/18/2017, 9 on 11/19/2017, 13 on 01/02/2018, 57 on 02/19/2018, 72 on 04/04/2018, 41 on 05/21/2018, 36 on 08/18/2018, 80 on 09/22/2018, and 45 on 11/25/2018.  EGD in 2016 revealed gastric ulcers and esophageal stricture which was dilated.  Barium swallow on 11/15/2015 was normal.  She has a history of mesenteric ischemia.  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  She was on Coumadin, and now on Plavix.  Colonoscopy in 2008 revealed diverticulosis.   EGD on 03/28/2017 revealed a moderate-sized area of extrinsic compression was found in the mid esophagus from about 25-28 cm, no apparent defect or abnormality in the mucosa or esophageal wall. There was a 2 cm cratered esophageal ulcer.  There was segmental moderate mucosal changes characterized by granularity at the gastroesophageal junction.  There was localized mild inflammation characterized by congestion (edema), depression and erythema was found on the greater curvature of the gastric body.  Biopsies revealed no H pylori, dysplasia or malignancy.  She is on pantoprazole and Carafate.  She has esophageal spasms improved with Bentyl.  EGD on 06/20/2017 revealed a non-bleeding esophageal ulcer, gastritis, and a normal duodenum.  Pathology revealed mild reactive gastropathy.  Colonoscopy on 06/20/2017 revealed non-bleeding external hemorrhoids, diverticulosis in the sigmoid colon, and one 1 mm polyp at the recto-sigmoid colon.  Pathology revealed a tubular adenoma  Negative for high grade dysplasia or malignancy.  Upper EUS on 12/05/2017 revealed esophageal stenosis.  Biopsy revealed CMV viral cytopathic effect.  There was no  evidence of malignancy.  The EUS scope could not be advanced into the stenosis as it was too tight.  There was  loss of normal wall layers and diffusely hypoechoic measuring from 3 mm to 6 mm in thickness.   She was treated with valacyclovir x 28 days.  CMV DNA quantitative by PCR was positive on 01/02/2018 and < 200 IU/ml on 01/30/2018.  Upper GI endoscopy on 04/15/2018 revealed extrinsic compression in the mid esophagus.  Esophageal biopsy revealed detached stratified squamous epithelium with focal reactive parakeratosis.  There was no dysplasia or malignancy.  There was a benign appearing esophageal stenosis.  She underwent EGD with esophageal dilatation on 05/01/2018 and 05/19/2018 at [facility].  Chest CT on 05/28/2018 revealed distal esophageal stricture at the GE junction with associated mild submucosal edema, possibly reflecting a post infectious/inflammatory stricture, underlying neoplasm not entirely excluded.  There was no extrinsic compression.  She underwent PEG tube placement on 06/11/2018.  She takes little by mouth.  She was diagnosed with cellulitis around her G tube on 11/21/2018.  She is on clindamycin and topical bacitracin.  Symptomatically, she is fatigued.  She has diarrhea.  Exam reveals only slight pinkness around the G tube.  Hemoglobin is 9.8.  Ferritin is 45.  Plan: 1.   Review labs from 11/24/2018.   2.   Iron deficiency anemia             Hematocrit 29.8.  Hemoglobin 8.9.    Ferritin  is 45.             Ferritin remains borderline.  Suspect may be falsely elevated secondary to current cellulitis.  No bleeding.  Venofer today. 3.   CMV esophagitis             Patient s/p long course of valacyclovir     s/p G tube placement.  Patient currently on antibiotics for G tube exit site infection, improving. 4.   Diarrhea  Patient has chronic diarrhea which appears worse on clindamycin.  Stool for C diff. 5.   RTC in 6 weeks for labs (CBC with diff, ferritin, iron studies,  sed rate, SPEP, FLCA). 6.   RTC in 3 months for MD assessment, labs (CBC with diff, ferritin, iron studies - [name] before), and +/- Venofer.    Honor Loh, NP  11/26/2018, 2:04 PM   I saw and evaluated the patient, participating in the key portions of the service and reviewing pertinent diagnostic studies and records.  I reviewed the nurse practitioner's note and agree with the findings and the plan.  The assessment and plan were discussed with the patient.  Several questions were asked by the patient and answered.   Nolon Stalls, MD 11/26/2018, 2:04 PM

## 2018-11-26 NOTE — Progress Notes (Signed)
Patient c/o pain but it is nothing new ( arthritis Pain to bilateral hands

## 2018-11-26 NOTE — Telephone Encounter (Signed)
patient is still in the office and Dr Ubaldo Glassing office has schedule the patient for appointmnet for low pulse while the patient is in office. Today in office the patient was 46/ b/p 146/46. For 11/27/2018 at 3:00 pm.

## 2018-11-26 NOTE — Patient Instructions (Signed)
Iron Sucrose injection What is this medicine? IRON SUCROSE (AHY ern SOO krohs) is an iron complex. Iron is used to make healthy red blood cells, which carry oxygen and nutrients throughout the body. This medicine is used to treat iron deficiency anemia in people with chronic kidney disease. This medicine may be used for other purposes; ask your health care provider or pharmacist if you have questions. COMMON BRAND NAME(S): Venofer What should I tell my health care provider before I take this medicine? They need to know if you have any of these conditions: -anemia not caused by low iron levels -heart disease -high levels of iron in the blood -kidney disease -liver disease -an unusual or allergic reaction to iron, other medicines, foods, dyes, or preservatives -pregnant or trying to get pregnant -breast-feeding How should I use this medicine? This medicine is for infusion into a vein. It is given by a health care professional in a hospital or clinic setting. Talk to your pediatrician regarding the use of this medicine in children. While this drug may be prescribed for children as young as 2 years for selected conditions, precautions do apply. Overdosage: If you think you have taken too much of this medicine contact a poison control center or emergency room at once. NOTE: This medicine is only for you. Do not share this medicine with others. What if I miss a dose? It is important not to miss your dose. Call your doctor or health care professional if you are unable to keep an appointment. What may interact with this medicine? Do not take this medicine with any of the following medications: -deferoxamine -dimercaprol -other iron products This medicine may also interact with the following medications: -chloramphenicol -deferasirox This list may not describe all possible interactions. Give your health care provider a list of all the medicines, herbs, non-prescription drugs, or dietary  supplements you use. Also tell them if you smoke, drink alcohol, or use illegal drugs. Some items may interact with your medicine. What should I watch for while using this medicine? Visit your doctor or healthcare professional regularly. Tell your doctor or healthcare professional if your symptoms do not start to get better or if they get worse. You may need blood work done while you are taking this medicine. You may need to follow a special diet. Talk to your doctor. Foods that contain iron include: whole grains/cereals, dried fruits, beans, or peas, leafy green vegetables, and organ meats (liver, kidney). What side effects may I notice from receiving this medicine? Side effects that you should report to your doctor or health care professional as soon as possible: -allergic reactions like skin rash, itching or hives, swelling of the face, lips, or tongue -breathing problems -changes in blood pressure -cough -fast, irregular heartbeat -feeling faint or lightheaded, falls -fever or chills -flushing, sweating, or hot feelings -joint or muscle aches/pains -seizures -swelling of the ankles or feet -unusually weak or tired Side effects that usually do not require medical attention (report to your doctor or health care professional if they continue or are bothersome): -diarrhea -feeling achy -headache -irritation at site where injected -nausea, vomiting -stomach upset -tiredness This list may not describe all possible side effects. Call your doctor for medical advice about side effects. You may report side effects to FDA at 1-800-FDA-1088. Where should I keep my medicine? This drug is given in a hospital or clinic and will not be stored at home. NOTE: This sheet is a summary. It may not cover all possible information. If   you have questions about this medicine, talk to your doctor, pharmacist, or health care provider.  2019 Elsevier/Gold Standard (2011-06-28 17:14:35)  

## 2018-11-27 ENCOUNTER — Telehealth: Payer: Self-pay

## 2018-11-27 NOTE — Telephone Encounter (Signed)
Nutrition  Called patient for nutrition follow-up.  Left message on home voice mail asking patient to return RD's call.    Rendell Thivierge B. Zenia Resides, [place], [place] Registered Dietitian [phone] (pager)

## 2018-11-27 NOTE — Telephone Encounter (Signed)
-----   Message from Karen Kitchens, NP sent at 11/26/2018  9:23 PM EST ----- Please let her know that her stool was (-) for C.diff. May use loperamide PRN. Increase intake of K+ rich foods and/or electrolyte containing fluids such as Gatorade or pedialyte.   Gaspar Bidding

## 2018-11-27 NOTE — Telephone Encounter (Signed)
Informed patient of (-) CDIFF results. Encouraged her to increase potassium intake but using Pedialyte with tube feedings. Pt verbalizes understanding and denies any concerns at this time.

## 2018-12-01 ENCOUNTER — Encounter: Payer: Medicare Other | Admitting: Family Medicine

## 2018-12-11 ENCOUNTER — Telehealth: Payer: Self-pay

## 2018-12-11 NOTE — Telephone Encounter (Signed)
Nutrition Follow-up:  Patient followed by Dr. Mike Gip for iron deficiency anemia.  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  PEG placed on 9/11.   Called patient again today for nutrition follow-up.  Have not had return phone call from previous message left on 2/27.   Patient reports that she finished antibiotics about 1 week ago and continues to have lots of gas and liquid stool.  Gas is most often after every feeding with some liquid stool.  Reports that she has taken beno, gas x without relief.  Also has been taking imodium but does not want to continue to take it.   Noted c-diff was negative.    Continues with Jevity 1.5, 4 cartons per day with promod 65ml daily.     Medications: reviewed  Labs: no new labs  Anthropometrics:   Weight noted on 2/26 115 lb 8.3 oz   Estimated Energy Needs  Kcals: 1275-1530  calories Protein: 64-77 g Fluid: 1.5 L/d  NUTRITION DIAGNOSIS: Inadequate oral intake related to stricture   INTERVENTION:  Patient does not want to change formula at this time.  RD discussed with patient concern for extra fiber in jevity contributing to increased gas.  Other option of patient agreeable could be ensure plus. Recommend patient trying banatrol plus for diarrhea and loose stool. Banatrol plus is safe to use via feeding tube. Reviewed mixing instructions Patient planning to have daughter try and get this today.   If banatrol plus does not help, other option would be to hold promod for 3-4 days to see if gas/diarrhea improve.       MONITORING, EVALUATION, GOAL: weight trends, intake, diarrhea/gas   NEXT VISIT: phone follow-up March 19th  Gari Hartsell B. Zenia Resides, [place], [place] Registered Dietitian [phone] (pager)

## 2018-12-18 ENCOUNTER — Telehealth: Payer: Self-pay

## 2018-12-18 NOTE — Telephone Encounter (Signed)
Nutrition Follow-up:  Patient followed by Dr. Mike Gip for iron deficiency anemia.  Patient with history of CMV esophagitis, esophageal stenosis.  PEG placed on 06/11/18 due to unable to take enough oral nutrition.  Called patient for nutrition follow up.  Patient reports that her diarrhea has resolved.  Reports that she has been able to eat some foods orally (soupy mashed potatoes, blended baked beans, buttered toast middle only. Eating only few tablespoons of each of these items.  Has drank some of glucerna shake only 1/2 at a time.    Reports that she is taking about 3-3 1/2 Jevity 1.5 via feeding tube.  Reports she gets bloated with eating and trying to do 4 jevity.  Also reports that she thinks diarrhea happens more when she takes 4 jevity.  Has been taking 66ml promod daily.  Did not try banatrol plus.     Medications: reviewed  Labs: reviewed  Anthropometrics:   Weight 115 lb on 2/26   Estimated Energy Needs  Kcals: 1275-1530 calories Protein: 64-77 g Fluid: 1.5 L/d  NUTRITION DIAGNOSIS: Inadequate oral intake continues   INTERVENTION:  Recommend patient drink at least 1/2 glucerna shake daily for added nutrition. Not willing to try higher calorie shake because a thinks it will effect blood sugar.  Recommend trying ensure plus vs jevity 1.5 through feeding tube to see if bloating, gas, improves. Recommend increasing promod from 36ml to 34ml for 200 calories and 20 g protein Encouraged patient to continue soft, blended foods orally.    MONITORING, EVALUATION, GOAL: tube feeding tolerance, weight, intake   NEXT VISIT: phone follow-up in 2 weeks  [name], [place], [place] Registered Dietitian [phone] (pager)

## 2018-12-22 ENCOUNTER — Other Ambulatory Visit: Payer: Self-pay | Admitting: Internal Medicine

## 2018-12-22 ENCOUNTER — Other Ambulatory Visit: Payer: Self-pay

## 2018-12-22 ENCOUNTER — Ambulatory Visit
Admission: RE | Admit: 2018-12-22 | Discharge: 2018-12-22 | Disposition: A | Discharge status: Home / Self Care | Payer: Medicare Other | Source: Ambulatory Visit | Attending: Internal Medicine | Admitting: Internal Medicine

## 2018-12-22 DIAGNOSIS — R600 Localized edema: Secondary | ICD-10-CM

## 2019-01-01 ENCOUNTER — Ambulatory Visit: Payer: Medicare Other

## 2019-01-01 ENCOUNTER — Other Ambulatory Visit: Payer: Self-pay

## 2019-01-01 NOTE — Progress Notes (Signed)
Nutrition Follow-up:  Patient followed by Dr. Mike Gip for iron deficiency anemia.  Patient with history of CMV esophagitis, esophageal stenosis.  PEG placed on 06/11/18 due to unable to take enough oral nutrition.    Called patient for nutrition follow-up.  Patient reports that diarrhea is much better.  Reports that she continues jevity 1.5, 3 1/2 cartons to 4 cartons per day (mostly 3) and promod 6ml daily.  Reports that she is still eating little bit orally, maybe 1/2 of glucerna shake daily (90 calories and 5 g protein), takes her all day to eat 1 hard boiled egg.  Majority of nutrition is coming from feeding tube.     Medications: reviewed  Labs: no recent  Anthropometrics:   No recent weight  115 lb on 2/26   Estimated Energy Needs  Kcals: 1275-1530 calories Protein: 64-77 g Fluid: 1.5 L/d  NUTRITION DIAGNOSIS: Inadequate oral intake continues relying on feeding tube    INTERVENTION:  Recommend for patient to continue 3 1/2-4 cartons daily of jevity 1.5 via feeding tube plus 70ml promod BID.  Tube feeding will provide 1442 calories and 72 g of protein.   Encouraged patient to continue to eat soft foods as able orally.   RD will contact home health to verify promod order to make sure home health sends her enough supply.      MONITORING, EVALUATION, GOAL: tube feeding tolerance, weight, intake   NEXT VISIT: phone follow-up Thursday, May 7    Banesa Tristan B. Zenia Resides, [place], [place] Registered Dietitian [phone] (pager)

## 2019-01-06 ENCOUNTER — Other Ambulatory Visit: Payer: Self-pay

## 2019-01-07 ENCOUNTER — Other Ambulatory Visit: Payer: Self-pay

## 2019-01-07 ENCOUNTER — Inpatient Hospital Stay: Payer: Medicare Other | Attending: Hematology and Oncology

## 2019-01-07 DIAGNOSIS — D509 Iron deficiency anemia, unspecified: Secondary | ICD-10-CM | POA: Diagnosis not present

## 2019-01-07 DIAGNOSIS — R634 Abnormal weight loss: Secondary | ICD-10-CM | POA: Insufficient documentation

## 2019-01-07 DIAGNOSIS — D508 Other iron deficiency anemias: Secondary | ICD-10-CM

## 2019-01-07 LAB — IRON AND TIBC
Iron: 48 ug/dL (ref 28–170)
Saturation Ratios: 14 % (ref 10.4–31.8)
TIBC: 353 ug/dL (ref 250–450)
UIBC: 305 ug/dL

## 2019-01-07 LAB — CBC WITH DIFFERENTIAL/PLATELET
Abs Immature Granulocytes: 0.01 10*3/uL (ref 0.00–0.07)
Basophils Absolute: 0 10*3/uL (ref 0.0–0.1)
Basophils Relative: 1 %
Eosinophils Absolute: 0.4 10*3/uL (ref 0.0–0.5)
Eosinophils Relative: 6 %
HCT: 33.7 % — ABNORMAL LOW (ref 36.0–46.0)
Hemoglobin: 10.8 g/dL — ABNORMAL LOW (ref 12.0–15.0)
Immature Granulocytes: 0 %
Lymphocytes Relative: 26 %
Lymphs Abs: 1.7 10*3/uL (ref 0.7–4.0)
MCH: 28.5 pg (ref 26.0–34.0)
MCHC: 32 g/dL (ref 30.0–36.0)
MCV: 88.9 fL (ref 80.0–100.0)
Monocytes Absolute: 0.5 10*3/uL (ref 0.1–1.0)
Monocytes Relative: 8 %
Neutro Abs: 3.7 10*3/uL (ref 1.7–7.7)
Neutrophils Relative %: 59 %
Platelets: 304 10*3/uL (ref 150–400)
RBC: 3.79 MIL/uL — ABNORMAL LOW (ref 3.87–5.11)
RDW: 14.6 % (ref 11.5–15.5)
WBC: 6.4 10*3/uL (ref 4.0–10.5)
nRBC: 0 % (ref 0.0–0.2)

## 2019-01-07 LAB — FERRITIN: Ferritin: 96 ng/mL (ref 11–307)

## 2019-01-07 LAB — SEDIMENTATION RATE: Sed Rate: 41 mm/hr — ABNORMAL HIGH (ref 0–30)

## 2019-01-08 LAB — PROTEIN ELECTROPHORESIS, SERUM
A/G Ratio: 1.1 (ref 0.7–1.7)
Albumin ELP: 3.4 g/dL (ref 2.9–4.4)
Alpha-1-Globulin: 0.2 g/dL (ref 0.0–0.4)
Alpha-2-Globulin: 0.8 g/dL (ref 0.4–1.0)
Beta Globulin: 1.2 g/dL (ref 0.7–1.3)
Gamma Globulin: 0.8 g/dL (ref 0.4–1.8)
Globulin, Total: 3.1 g/dL (ref 2.2–3.9)
Total Protein ELP: 6.5 g/dL (ref 6.0–8.5)

## 2019-01-08 LAB — KAPPA/LAMBDA LIGHT CHAINS
Kappa free light chain: 87.4 mg/L — ABNORMAL HIGH (ref 3.3–19.4)
Kappa, lambda light chain ratio: 1.68 — ABNORMAL HIGH (ref 0.26–1.65)
Lambda free light chains: 52.1 mg/L — ABNORMAL HIGH (ref 5.7–26.3)

## 2019-02-04 ENCOUNTER — Other Ambulatory Visit: Payer: Self-pay

## 2019-02-05 ENCOUNTER — Inpatient Hospital Stay: Payer: Medicare Other

## 2019-02-12 DIAGNOSIS — R002 Palpitations: Secondary | ICD-10-CM | POA: Insufficient documentation

## 2019-02-13 DIAGNOSIS — I071 Rheumatic tricuspid insufficiency: Secondary | ICD-10-CM | POA: Insufficient documentation

## 2019-02-17 ENCOUNTER — Inpatient Hospital Stay: Payer: Medicare Other | Attending: Hematology and Oncology

## 2019-02-17 ENCOUNTER — Other Ambulatory Visit: Payer: Self-pay

## 2019-02-17 DIAGNOSIS — Z90710 Acquired absence of both cervix and uterus: Secondary | ICD-10-CM | POA: Insufficient documentation

## 2019-02-17 DIAGNOSIS — Z931 Gastrostomy status: Secondary | ICD-10-CM | POA: Insufficient documentation

## 2019-02-17 DIAGNOSIS — D508 Other iron deficiency anemias: Secondary | ICD-10-CM | POA: Diagnosis not present

## 2019-02-17 DIAGNOSIS — E119 Type 2 diabetes mellitus without complications: Secondary | ICD-10-CM | POA: Insufficient documentation

## 2019-02-17 DIAGNOSIS — F329 Major depressive disorder, single episode, unspecified: Secondary | ICD-10-CM | POA: Diagnosis not present

## 2019-02-17 DIAGNOSIS — Z7984 Long term (current) use of oral hypoglycemic drugs: Secondary | ICD-10-CM | POA: Diagnosis not present

## 2019-02-17 DIAGNOSIS — Z79899 Other long term (current) drug therapy: Secondary | ICD-10-CM | POA: Insufficient documentation

## 2019-02-17 DIAGNOSIS — E785 Hyperlipidemia, unspecified: Secondary | ICD-10-CM | POA: Insufficient documentation

## 2019-02-17 DIAGNOSIS — I10 Essential (primary) hypertension: Secondary | ICD-10-CM | POA: Insufficient documentation

## 2019-02-17 DIAGNOSIS — Z85828 Personal history of other malignant neoplasm of skin: Secondary | ICD-10-CM | POA: Diagnosis not present

## 2019-02-17 LAB — CBC WITH DIFFERENTIAL/PLATELET
Abs Immature Granulocytes: 0.02 10*3/uL (ref 0.00–0.07)
Basophils Absolute: 0.1 10*3/uL (ref 0.0–0.1)
Basophils Relative: 1 %
Eosinophils Absolute: 0.3 10*3/uL (ref 0.0–0.5)
Eosinophils Relative: 6 %
HCT: 26.5 % — ABNORMAL LOW (ref 36.0–46.0)
Hemoglobin: 8.6 g/dL — ABNORMAL LOW (ref 12.0–15.0)
Immature Granulocytes: 0 %
Lymphocytes Relative: 22 %
Lymphs Abs: 1.1 10*3/uL (ref 0.7–4.0)
MCH: 29.7 pg (ref 26.0–34.0)
MCHC: 32.5 g/dL (ref 30.0–36.0)
MCV: 91.4 fL (ref 80.0–100.0)
Monocytes Absolute: 0.3 10*3/uL (ref 0.1–1.0)
Monocytes Relative: 6 %
Neutro Abs: 3.3 10*3/uL (ref 1.7–7.7)
Neutrophils Relative %: 65 %
Platelets: 324 10*3/uL (ref 150–400)
RBC: 2.9 MIL/uL — ABNORMAL LOW (ref 3.87–5.11)
RDW: 15.1 % (ref 11.5–15.5)
WBC: 5.1 10*3/uL (ref 4.0–10.5)
nRBC: 0 % (ref 0.0–0.2)

## 2019-02-17 LAB — IRON AND TIBC
Iron: 31 ug/dL (ref 28–170)
Saturation Ratios: 9 % — ABNORMAL LOW (ref 10.4–31.8)
TIBC: 364 ug/dL (ref 250–450)
UIBC: 333 ug/dL

## 2019-02-17 LAB — FERRITIN: Ferritin: 48 ng/mL (ref 11–307)

## 2019-02-17 NOTE — Progress Notes (Signed)
[facility]  [address], [address] [place], [place] [address] Phone: [phone]  Fax: [phone]   Clinic [name]:  02/18/2019  Referring physician: [name], MD  Chief Complaint: [name] is a 85 y.o. female with iron deficiency anemia who is seen for 3 month assessment.  HPI: The patient was last seen in the medical hematology clinic on 11/26/2018. At that time, she was fatigued.  She increased diarrhea on clindamycin for a G tube exit site infection.  Exam reveals only slight pinkness around the G tube.  Hemoglobin was 9.8.  Ferritin was 45.  C Diff was negative.  She received Venofer x 1.  She met with her PCP on 12/22/2018 c/o of bilateral leg pain. Venous US of bilateral lower extremities on 12/22/2018 was negative for significant DVT in either extremity. Lasix was increased to 13m 2x daily.   She met with ANigel Berthold MD, cardiology, on 12/30/2018. She continued HCTZ 212mdaily, lisinopril 4046mnce daily, and hydralazine 72m3mily.   She met with JoliJennet Maduro on 01/01/2019.  At that time, the majority of her nutrition came from her feeding tube due to difficulty swallowing. She was on jevity 1.5, 3 1/2 cartons to 4 cartons per [name] (mostly 3) and promod 60ml23mly.  She was still eating little bit orally, maybe 1/2 of glucerna shake daily (90 calories and 5 g protein).  It takes her all [name] to eat 1 hard boiled egg.  She was encouraged to eat soft food.  CBCs followed: 01/07/2019: WBC 6,400 (ANC 3,700), hemoglobin 10.8, hematocrit 33.7, platelets 304,000. Ferritin 96. Iron saturation 14%. Sed rate was 41.  02/17/2019: WBC 5,100 (ANC 3,300), hemoglobin 8.6, hematocrit 26.5, platelets 324,000.  Ferritin 48. Iron saturation 9% (low).  Additional labs on 01/07/2019 included a normal SPEP normal. Kappa free light chains were 87.4, lambda light chain 52.1, and ratio 1.68 (0.26 - 1.65).   During the interim, she has felt "ok".  She feels weak  and fatigued. She is not eating anything by mouth due to difficulty swallowing. She reports nausea and dysphagia when she eats.  She has dry mouth.   She denies any blood in her stool or black stool. She reports diarrhea 2-3x daily following consumption of Jevity shakes, which flares her hemorrhoids. She is using Imodium.   Irritation around her G-tube has resolved.  She notes leakage around the site that wets the bandage. Dr John Sandi Mariscalerventional radiologist, initially inserted her tube.    Past Medical History:  Diagnosis Date  . Anemia    IRON INFUSIONS  . Aortic valve sclerosis   . Arrhythmia   . Arthritis    osteoarthritis  . Basal cell carcinoma of skin   . Brain tumor (HCC) [place] Brain tumor (HCC) [place] Cervical spine disease   . Diabetes mellitus without complication (HCC) [place] Dysrhythmia    sinus arrhythmia  . Esophageal stricture    severe, led to feeding tube placement in Sept 2019  . Esophageal ulcer without bleeding   . Gastrostomy tube dependent (HCC) [place]   . GERD (gastroesophageal reflux disease)   . History of kidney stones   . Hyperlipemia   . Hypertension   . Kidney stones   . Leaky heart valve   . Meningioma (HCC) [place] O[place] mesenteric ischemia (HCC) [place] Pulmonary hypertension (HCC) [place] RLS (restless legs syndrome)   .  Vertigo     Past Surgical History:  Procedure Laterality Date  . ABDOMINAL HYSTERECTOMY    . APPENDECTOMY    . ARTHROGRAM KNEE Left   . BACK SURGERY     CERVIVAL NECK FUSION  . CATARACT EXTRACTION W/PHACO Left 11/11/2018   Procedure: CATARACT EXTRACTION PHACO AND INTRAOCULAR LENS PLACEMENT (IOC) LEFT, DIABETIC;  Surgeon: Birder Robson, MD;  Location: [facility] ORS;  Service: Ophthalmology;  Laterality: Left;  [place]  00:41 CDE 6.41 Fluid pack lot # [id number]  . COLONOSCOPY WITH PROPOFOL N/A 06/20/2017   Procedure: COLONOSCOPY WITH PROPOFOL;  Surgeon: Lollie Sails, MD;  Location: [facility] ENDOSCOPY;  Service:  Endoscopy;  Laterality: N/A;  . ESOPHAGOGASTRODUODENOSCOPY (EGD) WITH PROPOFOL N/A 03/14/2015   Procedure: ESOPHAGOGASTRODUODENOSCOPY (EGD) WITH PROPOFOL;  Surgeon: Josefine Class, MD;  Location: [facility] ENDOSCOPY;  Service: Endoscopy;  Laterality: N/A;  . ESOPHAGOGASTRODUODENOSCOPY (EGD) WITH PROPOFOL N/A 03/28/2017   Procedure: ESOPHAGOGASTRODUODENOSCOPY (EGD) WITH PROPOFOL;  Surgeon: Lollie Sails, MD;  Location: [facility] ENDOSCOPY;  Service: Endoscopy;  Laterality: N/A;  . ESOPHAGOGASTRODUODENOSCOPY (EGD) WITH PROPOFOL N/A 06/20/2017   Procedure: ESOPHAGOGASTRODUODENOSCOPY (EGD) WITH PROPOFOL;  Surgeon: Lollie Sails, MD;  Location: [facility] ENDOSCOPY;  Service: Endoscopy;  Laterality: N/A;  . ESOPHAGOGASTRODUODENOSCOPY (EGD) WITH PROPOFOL N/A 10/21/2017   Procedure: ESOPHAGOGASTRODUODENOSCOPY (EGD) WITH PROPOFOL;  Surgeon: Lollie Sails, MD;  Location: [facility] ENDOSCOPY;  Service: Endoscopy;  Laterality: N/A;  . ESOPHAGOGASTRODUODENOSCOPY (EGD) WITH PROPOFOL N/A 04/15/2018   Procedure: ESOPHAGOGASTRODUODENOSCOPY (EGD) WITH PROPOFOL;  Surgeon: Lollie Sails, MD;  Location: [facility] ENDOSCOPY;  Service: Endoscopy;  Laterality: N/A;  . ESOPHAGUS SURGERY     CLOSURE  . EYE SURGERY    . HYSTERECTOMY ABDOMINAL WITH SALPINGECTOMY    . IR GASTROSTOMY TUBE MOD SED  06/11/2018  . PERIPHERAL VASCULAR CATHETERIZATION N/A 01/23/2016   Procedure: Visceral Venography;  Surgeon: Algernon Huxley, MD;  Location: [place] CV LAB;  Service: Cardiovascular;  Laterality: N/A;  . PERIPHERAL VASCULAR CATHETERIZATION  01/23/2016   Procedure: Peripheral Vascular Intervention;  Surgeon: Algernon Huxley, MD;  Location: [place] CV LAB;  Service: Cardiovascular;;  . UPPER ESOPHAGEAL ENDOSCOPIC ULTRASOUND (EUS) N/A 12/05/2017   Procedure: UPPER ESOPHAGEAL ENDOSCOPIC ULTRASOUND (EUS);  Surgeon: Jola Schmidt, MD;  Location: [facility] ENDOSCOPY;  Service: Endoscopy;  Laterality: N/A;  . VISCERAL ANGIOGRAPHY N/A 03/18/2017    Procedure: Visceral Angiography;  Surgeon: Algernon Huxley, MD;  Location: [place] CV LAB;  Service: Cardiovascular;  Laterality: N/A;  . VISCERAL ARTERY INTERVENTION N/A 03/18/2017   Procedure: Visceral Artery Intervention;  Surgeon: Algernon Huxley, MD;  Location: [place] CV LAB;  Service: Cardiovascular;  Laterality: N/A;    Family History  Problem Relation Age of Onset  . Stroke Mother   . Hypertension Mother   . Cancer Father   . Heart attack Sister   . Diabetes Sister   . Heart attack Brother     Social History:  reports that she has never smoked. She has never used smokeless tobacco. She reports that she does not drink alcohol or use drugs. She is retired.  She worked at [employer].  She lives in [place].  Patient's contact number is [phone].  Daughter's number is [phone] [name]).  The patient is alone today.  Allergies:  Allergies  Allergen Reactions  . Gabapentin Swelling  . Lipitor [Atorvastatin] Other (See Comments)    Muscle aches  . Mevacor [Lovastatin] Other (See Comments)    Muscle aches  . Milk-Related Compounds  Other (See Comments)    Large quantities cause headaches   . Septra [Sulfamethoxazole-Trimethoprim] Other (See Comments)    Unknown  . Statins Other (See Comments)    Muscle pain  . Sulfur Diarrhea and Nausea And Vomiting  . Zocor [Simvastatin] Other (See Comments)    Muscle aches    Current Medications: Current Outpatient Medications  Medication Sig Dispense Refill  . acetaminophen (TYLENOL) 160 MG/5ML solution Place 20.3 mLs (650 mg total) into feeding tube every 6 (six) hours as needed for mild pain. 120 mL 0  . allopurinol (ZYLOPRIM) 100 MG tablet Place 100 mg into feeding tube daily.    . Amino Acids-Protein Hydrolys (FEEDING SUPPLEMENT, PRO-STAT SUGAR FREE 64,) LIQD Place 30 mLs into feeding tube daily. 450 mL 1  . bacitracin ointment Apply to affected area twice a [name] 30 g 0  . clopidogrel (PLAVIX) 75 MG tablet  TAKE 1 TABLET BY MOUTH ONCE DAILY (Patient taking differently: Take 75 mg by mouth daily. ) 30 tablet 2  . diltiazem (CARDIZEM) 60 MG tablet Take 60 mg by mouth 2 (two) times daily.     Marland Kitchen glipiZIDE (GLUCOTROL) 5 MG tablet Place 1 tablet (5 mg total) into feeding tube daily before breakfast.    . hydrALAZINE (APRESOLINE) 50 MG tablet Place 1 tablet (50 mg total) into feeding tube 2 (two) times daily.    . hydrochlorothiazide (HYDRODIURIL) 25 MG tablet Take 25 mg by mouth daily.  1  . Hypromellose (GENTEAL MILD) 0.2 % SOLN Place 1 drop into both eyes 3 (three) times daily.    Marland Kitchen lisinopril (PRINIVIL,ZESTRIL) 10 MG tablet Place 1 tablet (10 mg total) into feeding tube daily. 30 tablet 2  . loperamide (IMODIUM) 1 MG/5ML solution Take 3 mg by mouth as needed for diarrhea or loose stools.    . meclizine (ANTIVERT) 25 MG tablet Take 1 tablet by mouth as needed.    . Multiple Vitamin (MULTIVITAMIN) LIQD Place 15 mLs into feeding tube daily. 450 mL 1  . Nutritional Supplements (FEEDING SUPPLEMENT, JEVITY 1.5 CAL/FIBER,) LIQD Place 237 mLs into feeding tube 4 (four) times daily. 237 mL 90  . Oral Electrolytes (PEDIALYTE PO) Take by mouth daily.    . pantoprazole sodium (PROTONIX) 40 mg/20 mL PACK Place 20 mLs (40 mg total) into feeding tube 2 (two) times daily. 30 each 1  . rOPINIRole (REQUIP) 2 MG tablet Place 1 tablet (2 mg total) into feeding tube 2 (two) times daily. 60 tablet 1  . simethicone (MYLICON) 40 GL/8.7FI drops Take 1.2 mLs (80 mg total) by mouth 4 (four) times daily as needed for flatulence. If unable to tolerate orally, may administer via PEG tube. 90 mL 0  . vitamin C (VITAMIN C) 250 MG tablet Place 1 tablet (250 mg total) into feeding tube 2 (two) times daily. 60 tablet 1  . Water For Irrigation, Sterile (FREE WATER) SOLN Place 30 mLs into feeding tube every 4 (four) hours.     No current facility-administered medications for this visit.     Review of Systems  Constitutional: Positive  for malaise/fatigue and weight loss (1lbs). Negative for chills, diaphoresis and fever.       "I'm here".  HENT: Negative for congestion, hearing loss, nosebleeds, sinus pain and sore throat.        Dry mouth.  Eyes: Positive for blurred vision (vision issues, chronic).  Respiratory: Negative for cough, sputum production, shortness of breath and wheezing.   Cardiovascular: Negative for chest pain, leg  swelling and PND.  Gastrointestinal: Positive for diarrhea (2-3x daily). Negative for abdominal pain, blood in stool, constipation, heartburn, melena, nausea and vomiting.       Dysphagia. Irritation around g-tube resolved.  Genitourinary: Negative for dysuria, frequency, hematuria and urgency.  Musculoskeletal: Positive for joint pain (arhtiris, chronic pain). Negative for back pain and myalgias.  Skin: Negative for rash.       Dry skin.  Neurological: Positive for weakness (generalized). Negative for dizziness, tingling, sensory change and headaches.  Endo/Heme/Allergies: Does not bruise/bleed easily.       Thyroid disease on Synthroid.   Psychiatric/Behavioral: Negative for depression. The patient is not nervous/anxious.   All other systems reviewed and are negative.  Performance status (ECOG): 2  Physical Exam  Constitutional: She is oriented to person, place, and time. No distress.  Elderly woman sitting comfortably in the exam room in no acute distress.  HENT:  Head: Normocephalic and atraumatic.  Mouth/Throat: Oropharynx is clear and moist. No oropharyngeal exudate.  Short gray hair.  Mask.  Eyes: Pupils are equal, round, and reactive to light. Conjunctivae and EOM are normal. No scleral icterus.  Glasses.  Light brown eyes.  Neck: Normal range of motion. Neck supple. No JVD present.  Cardiovascular: Normal rate, regular rhythm and normal heart sounds.  No murmur heard. Pulmonary/Chest: Effort normal and breath sounds normal. No respiratory distress. She has no wheezes. She has  no rales.  Abdominal: Soft. Bowel sounds are normal. She exhibits no distension and no mass. There is no abdominal tenderness. There is no rebound and no guarding.  G tube exit site is unremarkable.  Musculoskeletal:        General: No tenderness or edema.  Lymphadenopathy:    She has no cervical adenopathy.    She has no axillary adenopathy.       Right: No supraclavicular adenopathy present.       Left: No supraclavicular adenopathy present.  Neurological: She is alert and oriented to person, place, and time. Gait (uses cane to ambulate) abnormal.  Skin: Skin is warm and dry. No rash noted. She is not diaphoretic. No erythema. No pallor.  Psychiatric: She has a normal mood and affect. Her behavior is normal. Judgment and thought content normal.  Nursing note and vitals reviewed.   Appointment on 02/17/2019  Component Date Value Ref Range Status  . Iron 02/17/2019 31  28 - 170 ug/dL Final  . TIBC 02/17/2019 364  250 - 450 ug/dL Final  . Saturation Ratios 02/17/2019 9* 10.4 - 31.8 % Final  . UIBC 02/17/2019 333  ug/dL Final   Performed at [facility], [address], [place], [place] [address]  . Ferritin 02/17/2019 48  11 - 307 ng/mL Final   Performed at [facility], [address] [place], [place] [address]  . WBC 02/17/2019 5.1  4.0 - 10.5 K/uL Final  . RBC 02/17/2019 2.90* 3.87 - 5.11 MIL/uL Final  . Hemoglobin 02/17/2019 8.6* 12.0 - 15.0 g/dL Final  . HCT 02/17/2019 26.5* 36.0 - 46.0 % Final  . MCV 02/17/2019 91.4  80.0 - 100.0 fL Final  . MCH 02/17/2019 29.7  26.0 - 34.0 pg Final  . MCHC 02/17/2019 32.5  30.0 - 36.0 g/dL Final  . RDW 02/17/2019 15.1  11.5 - 15.5 % Final  . Platelets 02/17/2019 324  150 - 400 K/uL Final  . nRBC 02/17/2019 0.0  0.0 - 0.2 % Final  . Neutrophils Relative % 02/17/2019 65  %  Final  . Neutro Abs 02/17/2019 3.3  1.7 - 7.7 K/uL Final  . Lymphocytes Relative 02/17/2019 22  % Final  . Lymphs Abs 02/17/2019 1.1  0.7 - 4.0 K/uL  Final  . Monocytes Relative 02/17/2019 6  % Final  . Monocytes Absolute 02/17/2019 0.3  0.1 - 1.0 K/uL Final  . Eosinophils Relative 02/17/2019 6  % Final  . Eosinophils Absolute 02/17/2019 0.3  0.0 - 0.5 K/uL Final  . Basophils Relative 02/17/2019 1  % Final  . Basophils Absolute 02/17/2019 0.1  0.0 - 0.1 K/uL Final  . Immature Granulocytes 02/17/2019 0  % Final  . Abs Immature Granulocytes 02/17/2019 0.02  0.00 - 0.07 K/uL Final   Performed at [facility], [address], [place], [place] [address]    Assessment:  [name] is a 85 y.o. female with iron deficiency anemia.  She has been on oral iron x 1 year with continued decline in her counts.  She has dysphagia and odynophagia.  She is on  PPI and carafate.  Diet is modest.  She denies pica.  Abdomen and pelvic CT on 02/19/2017 revealed no acute abnormality.  She has mild sigmoid diverticulosis.  CBC on 02/26/2017 revealed a hematocrit of 25.3, hemoglobin 8.0, and MCV 83.2.  Hemoccult studies x 1 were positive in 11/19/2016 and negative x 2 in 01/2017.  Ferritin was 5 with an iron saturation of 4%, and a TIBC of 487 on 01/30/2017.  Normal studies included:  creatinine (1.0), calcium, albumen, B12 (647), and TSH.  Folic acid was 97.6 on 08/09/2015.  Urinalysis revealed no blood on 01/23/2017.  SPEP was negative on 02/28/2017.  She received Venofer weekly x 3 (02/28/2017 - 03/14/2017), x 2 (04/19/2017 - 04/26/2017), x 2 (06/14/2017 - 06/21/2017), 11/21/2017, x 3 (01/09/2018 - 01/23/2018), 02/20/2018, x 2 (08/22/2018 - 09/01/2018), and 11/26/2018.   Ferritin has been followed: 8 on 02/28/2017, 164 on 03/19/2017, 27 on 04/18/2017, 82 on 05/17/2017, 38 on 06/10/2017, 100 on 07/18/2017, 40 on 09/18/2017, 9 on 11/19/2017, 13 on 01/02/2018, 57 on 02/19/2018, 72 on 04/04/2018, 41 on 05/21/2018, 36 on 08/18/2018, 80 on 09/22/2018, 45 on 11/25/2018, 96 on 01/07/2019, and 48 on 02/17/2019.  EGD in 2016 revealed gastric ulcers and  esophageal stricture which was dilated.  Barium swallow on 11/15/2015 was normal.  She has a history of mesenteric ischemia.  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  She was on Coumadin, and now on Plavix.  Colonoscopy in 2008 revealed diverticulosis.   EGD on 03/28/2017 revealed a moderate-sized area of extrinsic compression was found in the mid esophagus from about 25-28 cm, no apparent defect or abnormality in the mucosa or esophageal wall. There was a 2 cm cratered esophageal ulcer.  There was segmental moderate mucosal changes characterized by granularity at the gastroesophageal junction.  There was localized mild inflammation characterized by congestion (edema), depression and erythema was found on the greater curvature of the gastric body.  Biopsies revealed no H pylori, dysplasia or malignancy.  She is on pantoprazole and Carafate.  She has esophageal spasms improved with Bentyl.  EGD on 06/20/2017 revealed a non-bleeding esophageal ulcer, gastritis, and a normal duodenum.  Pathology revealed mild reactive gastropathy.  Colonoscopy on 06/20/2017 revealed non-bleeding external hemorrhoids, diverticulosis in the sigmoid colon, and one 1 mm polyp at the recto-sigmoid colon.  Pathology revealed a tubular adenoma  Negative for high grade dysplasia or malignancy.  Upper EUS on 12/05/2017 revealed esophageal stenosis. Biopsy revealed  CMV viral cytopathic effect. There was no evidence of malignancy. The EUS scope could not be advanced into the stenosis as it was too tight. There was loss of normal wall layers and diffusely hypoechoic measuring from 3 mm to 6 mm in thickness.   She was treated with valacyclovir x 28 days. CMV DNA quantitative by PCR was positive on 01/02/2018 and <200 IU/ml on 01/30/2018.  Upper GI endoscopy on 04/15/2018 revealed extrinsic compression in the mid esophagus. Esophageal biopsy revealed detached stratified squamous epithelium with focal reactive  parakeratosis. There was no dysplasia or malignancy. There was a benign appearing esophageal stenosis. She underwent EGD with esophageal dilatation on 05/01/2018 and 05/19/2018 at [facility].  Chest CT on 05/28/2018 revealed distal esophageal stricture at the GE junction with associated mild submucosal edema, possibly reflecting a post infectious/inflammatory stricture, underlying neoplasm not entirely excluded.  There was no extrinsic compression.  She underwent PEG tube placement on 06/11/2018.  She takes little by mouth.  Symptomatically, she remains weak.  She is taking little by moth.  She has loose stools managed with Imodium.  She feels her G tube may be leaking.  Exam reveals an unremarkable G tube exit site.  Plan: 1.   Review labs from 02/17/2019.  2. Iron deficiency anemia Hemoglobin is 8.6. MCV is 91.4.  Ferritin is 48.  Iron saturation is 9% (low), Discuss drop in hemogloblin and plan for addition IV iron.             Venofer today and weekly x 2. 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  Patient takes very little by mouth. 4. G tube leak  Possible leak per patient.  IR consultation re: gastrostomy tube check ASAP. 5.   Diarrhea  Patient notes recurrence of diarrhea with Jevity.  Volume appears small and managed with Imodium.  Follow-up with nutrition secondary to weight loss. 6.   RTC in 6 weeks for labs (CBC with diff, ferritin). 7.   RTC in 3 months for MD assessment, labs (CBC with diff, ferritin-[name] before) and +/- Venofer.  I discussed the assessment and treatment plan with the patient.  The patient was provided an opportunity to ask questions and all were answered.  The patient agreed with the plan and demonstrated an understanding of the instructions.  The patient was advised to call back if the symptoms worsen or if the condition fails to improve as anticipated.  I  provided 20 minutes of face-to-face time during this this encounter and > 50% was spent counseling as documented under my assessment and plan.    Lequita Asal, MD, PhD    02/18/2019, 10:16 AM  I, Molly Dorshimer, am acting as Education administrator for [employer]. Mike Gip, MD, PhD.  I,  C. Mike Gip, MD, have reviewed the above documentation for accuracy and completeness, and I agree with the above.

## 2019-02-18 ENCOUNTER — Inpatient Hospital Stay (HOSPITAL_BASED_OUTPATIENT_CLINIC_OR_DEPARTMENT_OTHER): Payer: Medicare Other | Admitting: Hematology and Oncology

## 2019-02-18 ENCOUNTER — Inpatient Hospital Stay: Payer: Medicare Other

## 2019-02-18 ENCOUNTER — Encounter: Payer: Self-pay | Admitting: Hematology and Oncology

## 2019-02-18 VITALS — BP 128/53 | HR 72 | Temp 97.5°F | Resp 16 | Wt 115.0 lb

## 2019-02-18 VITALS — BP 117/63 | HR 73 | Temp 97.1°F | Resp 17

## 2019-02-18 DIAGNOSIS — R634 Abnormal weight loss: Secondary | ICD-10-CM

## 2019-02-18 DIAGNOSIS — Z931 Gastrostomy status: Secondary | ICD-10-CM | POA: Diagnosis not present

## 2019-02-18 DIAGNOSIS — Z79899 Other long term (current) drug therapy: Secondary | ICD-10-CM

## 2019-02-18 DIAGNOSIS — E119 Type 2 diabetes mellitus without complications: Secondary | ICD-10-CM | POA: Diagnosis not present

## 2019-02-18 DIAGNOSIS — E43 Unspecified severe protein-calorie malnutrition: Secondary | ICD-10-CM

## 2019-02-18 DIAGNOSIS — I10 Essential (primary) hypertension: Secondary | ICD-10-CM

## 2019-02-18 DIAGNOSIS — Z90710 Acquired absence of both cervix and uterus: Secondary | ICD-10-CM

## 2019-02-18 DIAGNOSIS — D508 Other iron deficiency anemias: Secondary | ICD-10-CM

## 2019-02-18 DIAGNOSIS — F329 Major depressive disorder, single episode, unspecified: Secondary | ICD-10-CM

## 2019-02-18 DIAGNOSIS — R197 Diarrhea, unspecified: Secondary | ICD-10-CM

## 2019-02-18 DIAGNOSIS — E785 Hyperlipidemia, unspecified: Secondary | ICD-10-CM

## 2019-02-18 DIAGNOSIS — Z85828 Personal history of other malignant neoplasm of skin: Secondary | ICD-10-CM

## 2019-02-18 DIAGNOSIS — Z7984 Long term (current) use of oral hypoglycemic drugs: Secondary | ICD-10-CM

## 2019-02-18 DIAGNOSIS — D509 Iron deficiency anemia, unspecified: Secondary | ICD-10-CM

## 2019-02-18 MED ORDER — SODIUM CHLORIDE 0.9 % IV SOLN: 200.0000 mg | INTRAVENOUS | Status: DC

## 2019-02-18 MED ORDER — IRON SUCROSE 20 MG/ML IV SOLN
200.0000 mg | Freq: Once | INTRAVENOUS | Status: AC
  Administered 2019-02-18: 11:00:00 200 mg via INTRAVENOUS

## 2019-02-18 MED ORDER — IRON SUCROSE 20 MG/ML IV SOLN
INTRAVENOUS | Status: AC
  Filled 2019-02-18: qty 10

## 2019-02-18 MED ORDER — SODIUM CHLORIDE 0.9 % IV SOLN
INTRAVENOUS | Status: DC
  Administered 2019-02-18: 11:00:00 via INTRAVENOUS
  Filled 2019-02-18: qty 250

## 2019-02-18 NOTE — Patient Instructions (Signed)
Iron Sucrose injection What is this medicine? IRON SUCROSE (AHY ern SOO krohs) is an iron complex. Iron is used to make healthy red blood cells, which carry oxygen and nutrients throughout the body. This medicine is used to treat iron deficiency anemia in people with chronic kidney disease. This medicine may be used for other purposes; ask your health care provider or pharmacist if you have questions. COMMON BRAND NAME(S): Venofer What should I tell my health care provider before I take this medicine? They need to know if you have any of these conditions: -anemia not caused by low iron levels -heart disease -high levels of iron in the blood -kidney disease -liver disease -an unusual or allergic reaction to iron, other medicines, foods, dyes, or preservatives -pregnant or trying to get pregnant -breast-feeding How should I use this medicine? This medicine is for infusion into a vein. It is given by a health care professional in a hospital or clinic setting. Talk to your pediatrician regarding the use of this medicine in children. While this drug may be prescribed for children as young as 2 years for selected conditions, precautions do apply. Overdosage: If you think you have taken too much of this medicine contact a poison control center or emergency room at once. NOTE: This medicine is only for you. Do not share this medicine with others. What if I miss a dose? It is important not to miss your dose. Call your doctor or health care professional if you are unable to keep an appointment. What may interact with this medicine? Do not take this medicine with any of the following medications: -deferoxamine -dimercaprol -other iron products This medicine may also interact with the following medications: -chloramphenicol -deferasirox This list may not describe all possible interactions. Give your health care provider a list of all the medicines, herbs, non-prescription drugs, or dietary  supplements you use. Also tell them if you smoke, drink alcohol, or use illegal drugs. Some items may interact with your medicine. What should I watch for while using this medicine? Visit your doctor or healthcare professional regularly. Tell your doctor or healthcare professional if your symptoms do not start to get better or if they get worse. You may need blood work done while you are taking this medicine. You may need to follow a special diet. Talk to your doctor. Foods that contain iron include: whole grains/cereals, dried fruits, beans, or peas, leafy green vegetables, and organ meats (liver, kidney). What side effects may I notice from receiving this medicine? Side effects that you should report to your doctor or health care professional as soon as possible: -allergic reactions like skin rash, itching or hives, swelling of the face, lips, or tongue -breathing problems -changes in blood pressure -cough -fast, irregular heartbeat -feeling faint or lightheaded, falls -fever or chills -flushing, sweating, or hot feelings -joint or muscle aches/pains -seizures -swelling of the ankles or feet -unusually weak or tired Side effects that usually do not require medical attention (report to your doctor or health care professional if they continue or are bothersome): -diarrhea -feeling achy -headache -irritation at site where injected -nausea, vomiting -stomach upset -tiredness This list may not describe all possible side effects. Call your doctor for medical advice about side effects. You may report side effects to FDA at 1-800-FDA-1088. Where should I keep my medicine? This drug is given in a hospital or clinic and will not be stored at home. NOTE: This sheet is a summary. It may not cover all possible information. If   you have questions about this medicine, talk to your doctor, pharmacist, or health care provider.  2019 Elsevier/Gold Standard (2011-06-28 17:14:35)  

## 2019-02-18 NOTE — Progress Notes (Signed)
Pt here for follow up. Denies any concerns at this time.  

## 2019-02-19 ENCOUNTER — Inpatient Hospital Stay: Payer: Medicare Other

## 2019-02-19 ENCOUNTER — Other Ambulatory Visit: Payer: Self-pay

## 2019-02-19 NOTE — Progress Notes (Signed)
Nutrition Follow-up:  RD working remotely.  Patient followed by Dr. Mike Gip for iron deficiency anemia.  Patient with history of CMV esophagitis, esophageal stenosis.  PEG placed on 06/11/18 due to unable to take enough oral intake.    Called patient for nutrition follow-up.  Patient reports that she is taking 4 cartons of jevity 1.5 (6am, 11am, 3pm and 6-7pm) daily for the past 2 weeks via feeding tube and 58ml of promod daily.  Reports that she is not taking anything by mouth. May eat a piece of candy and that is all due to dysphagia. Reports water flushes are still the same but can't tell RD home much water she is giving.  RD had previously estimated about 1257ml water flush with formula and flush not counting flush with medication.     Reports that usually has "loose" stool in am after first feeding and then may have another loose BM a few hours later.  Sometimes will have loose BM after lunch time feeding.  Reports that she takes imodium child's dose 1 time about every 3-4 days.    Reports that she is urinating well.    Reports saw GI yesterday for PEG tube site.     Medications: reviewed  Labs: reviewed  Anthropometrics:   Weight 114 lb noted on 5/20 decreased from 115 lb on 2/26.     Re-Estimated Energy Needs  Kcals: 1560-1820 Protein: 78-91 g Fluid: 1.5 L  NUTRITION DIAGNOSIS: Inadequate oral intake continues relying on feeding tube for nutrition   INTERVENTION:  Discussed options for switching tube feeding to help with diarrhea (trial of ensure plus or other formula) or trying banatrol plus.  Patient does not want to try these at this time and wants to continue with current formula and taking imodium as needed.   Patient to continue jevity 1.5, 4 cartons daily via feeding tube with 92ml of promod daily.  Water flush ~1248ml (counting free water from formula and flush).  Patient gives additional water with medication.  Tube feeding provides 1620 calories, 80 g protein and  ~1283ml free water plus water with medications.   Patient reports that she has good supply of formula, promod and supplies. Knows that she needs to contact [facility] [place] (previously [place] for tube feeding supplies.      MONITORING, EVALUATION, GOAL: tube feeding tolerance, weight   NEXT VISIT: phone f/u August 13th (Thursday). Patient can contact RD prior if needed  Jeffifer Rabold B. Zenia Resides, [place], [place] Registered Dietitian [phone] (pager)

## 2019-02-25 ENCOUNTER — Other Ambulatory Visit: Payer: Self-pay

## 2019-02-25 ENCOUNTER — Inpatient Hospital Stay: Payer: Medicare Other

## 2019-02-25 VITALS — BP 119/65 | HR 70 | Temp 97.2°F | Resp 18

## 2019-02-25 DIAGNOSIS — D509 Iron deficiency anemia, unspecified: Secondary | ICD-10-CM

## 2019-02-25 DIAGNOSIS — D508 Other iron deficiency anemias: Secondary | ICD-10-CM | POA: Diagnosis not present

## 2019-02-25 MED ORDER — SODIUM CHLORIDE 0.9 % IV SOLN
Freq: Once | INTRAVENOUS | Status: AC
  Administered 2019-02-25: 14:00:00 via INTRAVENOUS
  Filled 2019-02-25: qty 250

## 2019-02-25 MED ORDER — IRON SUCROSE 20 MG/ML IV SOLN
200.0000 mg | Freq: Once | INTRAVENOUS | Status: AC
  Administered 2019-02-25: 200 mg via INTRAVENOUS
  Filled 2019-02-25: qty 10

## 2019-02-25 MED ORDER — SODIUM CHLORIDE 0.9 % IV SOLN: 200.0000 mg | INTRAVENOUS | Status: DC

## 2019-02-25 NOTE — Patient Instructions (Signed)
Iron Sucrose injection What is this medicine? IRON SUCROSE (AHY ern SOO krohs) is an iron complex. Iron is used to make healthy red blood cells, which carry oxygen and nutrients throughout the body. This medicine is used to treat iron deficiency anemia in people with chronic kidney disease. This medicine may be used for other purposes; ask your health care provider or pharmacist if you have questions. COMMON BRAND NAME(S): Venofer What should I tell my health care provider before I take this medicine? They need to know if you have any of these conditions: -anemia not caused by low iron levels -heart disease -high levels of iron in the blood -kidney disease -liver disease -an unusual or allergic reaction to iron, other medicines, foods, dyes, or preservatives -pregnant or trying to get pregnant -breast-feeding How should I use this medicine? This medicine is for infusion into a vein. It is given by a health care professional in a hospital or clinic setting. Talk to your pediatrician regarding the use of this medicine in children. While this drug may be prescribed for children as young as 2 years for selected conditions, precautions do apply. Overdosage: If you think you have taken too much of this medicine contact a poison control center or emergency room at once. NOTE: This medicine is only for you. Do not share this medicine with others. What if I miss a dose? It is important not to miss your dose. Call your doctor or health care professional if you are unable to keep an appointment. What may interact with this medicine? Do not take this medicine with any of the following medications: -deferoxamine -dimercaprol -other iron products This medicine may also interact with the following medications: -chloramphenicol -deferasirox This list may not describe all possible interactions. Give your health care provider a list of all the medicines, herbs, non-prescription drugs, or dietary  supplements you use. Also tell them if you smoke, drink alcohol, or use illegal drugs. Some items may interact with your medicine. What should I watch for while using this medicine? Visit your doctor or healthcare professional regularly. Tell your doctor or healthcare professional if your symptoms do not start to get better or if they get worse. You may need blood work done while you are taking this medicine. You may need to follow a special diet. Talk to your doctor. Foods that contain iron include: whole grains/cereals, dried fruits, beans, or peas, leafy green vegetables, and organ meats (liver, kidney). What side effects may I notice from receiving this medicine? Side effects that you should report to your doctor or health care professional as soon as possible: -allergic reactions like skin rash, itching or hives, swelling of the face, lips, or tongue -breathing problems -changes in blood pressure -cough -fast, irregular heartbeat -feeling faint or lightheaded, falls -fever or chills -flushing, sweating, or hot feelings -joint or muscle aches/pains -seizures -swelling of the ankles or feet -unusually weak or tired Side effects that usually do not require medical attention (report to your doctor or health care professional if they continue or are bothersome): -diarrhea -feeling achy -headache -irritation at site where injected -nausea, vomiting -stomach upset -tiredness This list may not describe all possible side effects. Call your doctor for medical advice about side effects. You may report side effects to FDA at 1-800-FDA-1088. Where should I keep my medicine? This drug is given in a hospital or clinic and will not be stored at home. NOTE: This sheet is a summary. It may not cover all possible information. If   you have questions about this medicine, talk to your doctor, pharmacist, or health care provider.  2019 Elsevier/Gold Standard (2011-06-28 17:14:35)  

## 2019-03-04 ENCOUNTER — Other Ambulatory Visit: Payer: Self-pay

## 2019-03-04 ENCOUNTER — Inpatient Hospital Stay: Payer: Medicare Other | Attending: Hematology and Oncology

## 2019-03-04 VITALS — BP 101/51 | HR 66 | Resp 18

## 2019-03-04 DIAGNOSIS — D509 Iron deficiency anemia, unspecified: Secondary | ICD-10-CM | POA: Diagnosis not present

## 2019-03-04 MED ORDER — SODIUM CHLORIDE 0.9 % IV SOLN
INTRAVENOUS | Status: DC
  Administered 2019-03-04: 14:00:00 via INTRAVENOUS
  Filled 2019-03-04: qty 250

## 2019-03-04 MED ORDER — IRON SUCROSE 20 MG/ML IV SOLN
200.0000 mg | Freq: Once | INTRAVENOUS | Status: AC
  Administered 2019-03-04: 200 mg via INTRAVENOUS
  Filled 2019-03-04: qty 10

## 2019-03-04 MED ORDER — SODIUM CHLORIDE 0.9 % IV SOLN: 200.0000 mg | INTRAVENOUS | Status: DC

## 2019-03-18 DIAGNOSIS — R0789 Other chest pain: Secondary | ICD-10-CM | POA: Insufficient documentation

## 2019-03-24 ENCOUNTER — Other Ambulatory Visit: Payer: Self-pay | Admitting: Gastroenterology

## 2019-03-24 DIAGNOSIS — K222 Esophageal obstruction: Secondary | ICD-10-CM

## 2019-03-25 ENCOUNTER — Other Ambulatory Visit: Payer: Self-pay

## 2019-03-25 ENCOUNTER — Ambulatory Visit
Admission: RE | Admit: 2019-03-25 | Discharge: 2019-03-25 | Disposition: A | Discharge status: Home / Self Care | Payer: Medicare Other | Source: Ambulatory Visit | Attending: Gastroenterology | Admitting: Gastroenterology

## 2019-03-25 ENCOUNTER — Encounter: Payer: Self-pay | Admitting: Interventional Radiology

## 2019-03-25 ENCOUNTER — Other Ambulatory Visit
Admission: RE | Admit: 2019-03-25 | Discharge: 2019-03-25 | Disposition: A | Discharge status: Home / Self Care | Payer: Medicare Other | Source: Ambulatory Visit | Attending: Gastroenterology | Admitting: Gastroenterology

## 2019-03-25 DIAGNOSIS — Z1159 Encounter for screening for other viral diseases: Secondary | ICD-10-CM | POA: Diagnosis not present

## 2019-03-25 DIAGNOSIS — K222 Esophageal obstruction: Secondary | ICD-10-CM | POA: Insufficient documentation

## 2019-03-25 DIAGNOSIS — Z431 Encounter for attention to gastrostomy: Secondary | ICD-10-CM | POA: Insufficient documentation

## 2019-03-25 HISTORY — PX: IR GASTROSTOMY TUBE REMOVAL: IMG5492

## 2019-03-25 LAB — SARS CORONAVIRUS 2 BY RT PCR (HOSPITAL ORDER, PERFORMED IN ~~LOC~~ HOSPITAL LAB): SARS Coronavirus 2: NEGATIVE

## 2019-03-25 MED ORDER — IODIXANOL 320 MG/ML IV SOLN
50.0000 mL | Freq: Once | INTRAVENOUS | Status: AC | PRN
  Administered 2019-03-25: 5 mL via INTRAVENOUS

## 2019-03-25 NOTE — Procedures (Signed)
Pre procedural Dx: Dysphagia, fractured feeding tube. Post procedural Dx: Same  Successful fluoroscopic guided replacement of exisitng 18 Fr gastrostomy tube.   The feeding tube is ready for immediate use.  EBL: None  Complications: None immediate.  Ronny Bacon, MD Pager #: [phone]

## 2019-03-30 NOTE — Progress Notes (Signed)
Received call from patient's daughter-[name] Smith-stated the new G-tube was "tight and painful".  Explained to daughter how to adjust the rubber bumper of tube.  Called back to say that the adjustment was successful and tube is no longer painful.

## 2019-04-01 ENCOUNTER — Inpatient Hospital Stay: Payer: Medicare Other

## 2019-04-02 ENCOUNTER — Inpatient Hospital Stay: Payer: Medicare Other | Attending: Hematology and Oncology

## 2019-04-02 ENCOUNTER — Other Ambulatory Visit: Payer: Self-pay

## 2019-04-02 DIAGNOSIS — Z931 Gastrostomy status: Secondary | ICD-10-CM

## 2019-04-02 DIAGNOSIS — D508 Other iron deficiency anemias: Secondary | ICD-10-CM

## 2019-04-02 DIAGNOSIS — D509 Iron deficiency anemia, unspecified: Secondary | ICD-10-CM | POA: Diagnosis not present

## 2019-04-02 LAB — CBC WITH DIFFERENTIAL/PLATELET
Abs Immature Granulocytes: 0.01 10*3/uL (ref 0.00–0.07)
Basophils Absolute: 0 10*3/uL (ref 0.0–0.1)
Basophils Relative: 1 %
Eosinophils Absolute: 0.3 10*3/uL (ref 0.0–0.5)
Eosinophils Relative: 6 %
HCT: 29.4 % — ABNORMAL LOW (ref 36.0–46.0)
Hemoglobin: 9.9 g/dL — ABNORMAL LOW (ref 12.0–15.0)
Immature Granulocytes: 0 %
Lymphocytes Relative: 18 %
Lymphs Abs: 1.1 10*3/uL (ref 0.7–4.0)
MCH: 30.8 pg (ref 26.0–34.0)
MCHC: 33.7 g/dL (ref 30.0–36.0)
MCV: 91.6 fL (ref 80.0–100.0)
Monocytes Absolute: 0.4 10*3/uL (ref 0.1–1.0)
Monocytes Relative: 7 %
Neutro Abs: 4 10*3/uL (ref 1.7–7.7)
Neutrophils Relative %: 68 %
Platelets: 287 10*3/uL (ref 150–400)
RBC: 3.21 MIL/uL — ABNORMAL LOW (ref 3.87–5.11)
RDW: 13.8 % (ref 11.5–15.5)
WBC: 5.9 10*3/uL (ref 4.0–10.5)
nRBC: 0 % (ref 0.0–0.2)

## 2019-04-02 LAB — FERRITIN: Ferritin: 104 ng/mL (ref 11–307)

## 2019-05-11 ENCOUNTER — Other Ambulatory Visit: Payer: Self-pay

## 2019-05-11 NOTE — Progress Notes (Signed)
[facility]  [address] [place], [place] [address] Phone: [phone]  Fax: [phone]   Clinic Day:  05/13/2019  Referring physician: [name], MD  Chief Complaint: [name] Day is a 85 y.o. female with iron deficiency anemia who is seen for3 month assessment.  HPI: The patient was last seen in the hematology clinic on 02/18/2019. At that time, she remained weak.  She was taking little by mouth.  She had loose stools managed with Imodium.  She felt her G tube might be leaking.  Exam revealed an unremarkable G tube exit site. Hemoglobin was 8.6.  Ferritin was 48 with an iron saturation 9%.   She received Venofer x3 (02/18/2019 - 03/04/2019).   She underwent a G-tube exchange on 03/25/2019 with Dr. Gustavo Lah.  Labs followed: 04/02/2019: Hematocrit 29.4, hemoglobin 9.9, MCV 91.6, platelets 289,000, WBC 5,900. Ferritin 104.  05/12/2019: Hematocrit 28.7, hemoglobin 9.3, MCV 90.3, platelets 303,000, WBC 6,200. Ferritin 73 with an iron saturation 9%. Folate 25.0. B-12 706.  During the interim, she has been "doing pretty good." She has continued to use her G-tube for all nutrition. She reports "yellow-looking" drainage from her G-tube. She reports an increase in energy after receiving Venofer.   She denies any blood in her stools, black stools, vaginal bleeding, blood in her urine, or bleeding at her g-tube site.    Past Medical History:  Diagnosis Date   Anemia    IRON INFUSIONS   Aortic valve sclerosis    Arrhythmia    Arthritis    osteoarthritis   Basal cell carcinoma of skin    Brain tumor ([place])    Brain tumor ([place])    Cervical spine disease    Diabetes mellitus without complication (HCC)    Dysrhythmia    sinus arrhythmia   Esophageal stricture    severe, led to feeding tube placement in Sept 2019   Esophageal ulcer without bleeding    Gastrostomy tube dependent (HCC)    DOES NOT EAT OR DRINK    GERD  (gastroesophageal reflux disease)    History of kidney stones    Hyperlipemia    Hypertension    Kidney stones    Leaky heart valve    Meningioma (HCC)    Occlusive mesenteric ischemia (HCC)    Pulmonary hypertension (HCC)    RLS (restless legs syndrome)    Vertigo     Past Surgical History:  Procedure Laterality Date   ABDOMINAL HYSTERECTOMY     APPENDECTOMY     ARTHROGRAM KNEE Left    BACK SURGERY     CERVIVAL NECK FUSION   CATARACT EXTRACTION W/PHACO Left 11/11/2018   Procedure: CATARACT EXTRACTION PHACO AND INTRAOCULAR LENS PLACEMENT ([place]) LEFT, DIABETIC;  Surgeon: Birder Robson, MD;  Location: [facility] ORS;  Service: Ophthalmology;  Laterality: Left;  [place]  00:41 CDE 6.41 Fluid pack lot # [id number]   COLONOSCOPY WITH PROPOFOL N/A 06/20/2017   Procedure: COLONOSCOPY WITH PROPOFOL;  Surgeon: Lollie Sails, MD;  Location: [facility] ENDOSCOPY;  Service: Endoscopy;  Laterality: N/A;   ESOPHAGOGASTRODUODENOSCOPY (EGD) WITH PROPOFOL N/A 03/14/2015   Procedure: ESOPHAGOGASTRODUODENOSCOPY (EGD) WITH PROPOFOL;  Surgeon: Josefine Class, MD;  Location: [facility] ENDOSCOPY;  Service: Endoscopy;  Laterality: N/A;   ESOPHAGOGASTRODUODENOSCOPY (EGD) WITH PROPOFOL N/A 03/28/2017   Procedure: ESOPHAGOGASTRODUODENOSCOPY (EGD) WITH PROPOFOL;  Surgeon: Lollie Sails, MD;  Location: [facility] ENDOSCOPY;  Service: Endoscopy;  Laterality: N/A;   ESOPHAGOGASTRODUODENOSCOPY (EGD) WITH PROPOFOL N/A 06/20/2017   Procedure: ESOPHAGOGASTRODUODENOSCOPY (  EGD) WITH PROPOFOL;  Surgeon: Lollie Sails, MD;  Location: [facility] ENDOSCOPY;  Service: Endoscopy;  Laterality: N/A;   ESOPHAGOGASTRODUODENOSCOPY (EGD) WITH PROPOFOL N/A 10/21/2017   Procedure: ESOPHAGOGASTRODUODENOSCOPY (EGD) WITH PROPOFOL;  Surgeon: Lollie Sails, MD;  Location: [facility] ENDOSCOPY;  Service: Endoscopy;  Laterality: N/A;   ESOPHAGOGASTRODUODENOSCOPY (EGD) WITH PROPOFOL N/A 04/15/2018   Procedure: ESOPHAGOGASTRODUODENOSCOPY  (EGD) WITH PROPOFOL;  Surgeon: Lollie Sails, MD;  Location: [facility] ENDOSCOPY;  Service: Endoscopy;  Laterality: N/A;   ESOPHAGUS SURGERY     CLOSURE   EYE SURGERY     HYSTERECTOMY ABDOMINAL WITH SALPINGECTOMY     IR GASTROSTOMY TUBE MOD SED  06/11/2018   IR GASTROSTOMY TUBE REMOVAL  03/25/2019   PERIPHERAL VASCULAR CATHETERIZATION N/A 01/23/2016   Procedure: Visceral Venography;  Surgeon: Algernon Huxley, MD;  Location: [place] CV LAB;  Service: Cardiovascular;  Laterality: N/A;   PERIPHERAL VASCULAR CATHETERIZATION  01/23/2016   Procedure: Peripheral Vascular Intervention;  Surgeon: Algernon Huxley, MD;  Location: [place] CV LAB;  Service: Cardiovascular;;   UPPER ESOPHAGEAL ENDOSCOPIC ULTRASOUND (EUS) N/A 12/05/2017   Procedure: UPPER ESOPHAGEAL ENDOSCOPIC ULTRASOUND (EUS);  Surgeon: Jola Schmidt, MD;  Location: [facility] ENDOSCOPY;  Service: Endoscopy;  Laterality: N/A;   VISCERAL ANGIOGRAPHY N/A 03/18/2017   Procedure: Visceral Angiography;  Surgeon: Algernon Huxley, MD;  Location: [name] CV LAB;  Service: Cardiovascular;  Laterality: N/A;   VISCERAL ARTERY INTERVENTION N/A 03/18/2017   Procedure: Visceral Artery Intervention;  Surgeon: Algernon Huxley, MD;  Location: [place] CV LAB;  Service: Cardiovascular;  Laterality: N/A;    Family History  Problem Relation Age of Onset   Stroke Mother    Hypertension Mother    Cancer Father    Heart attack Sister    Diabetes Sister    Heart attack Brother     Social History:  reports that she has never smoked. She has never used smokeless tobacco. She reports that she does not drink alcohol or use drugs. She is retired. She worked at [employer]. She lives in [place].Patient's contact number is [phone]. Daughter's number is [phone] [name]). The patient is alone today.  Allergies:  Allergies  Allergen Reactions   Gabapentin Swelling   Lipitor [Atorvastatin] Other (See Comments)    Muscle  aches   Mevacor [Lovastatin] Other (See Comments)    Muscle aches   Milk-Related Compounds Other (See Comments)    Large quantities cause headaches    Septra [Sulfamethoxazole-Trimethoprim] Other (See Comments)    Unknown   Statins Other (See Comments)    Muscle pain   Sulfur Diarrhea and Nausea And Vomiting   Zocor [Simvastatin] Other (See Comments)    Muscle aches    Current Medications: Current Outpatient Medications  Medication Sig Dispense Refill   acetaminophen (TYLENOL) 160 MG/5ML solution Place 20.3 mLs (650 mg total) into feeding tube every 6 (six) hours as needed for mild pain. 120 mL 0   allopurinol (ZYLOPRIM) 100 MG tablet Place 100 mg into feeding tube daily.     Amino Acids-Protein Hydrolys (FEEDING SUPPLEMENT, PRO-STAT SUGAR FREE 64,) LIQD Place 30 mLs into feeding tube daily. 450 mL 1   bacitracin ointment Apply to affected area twice a day 30 g 0   clopidogrel (PLAVIX) 75 MG tablet TAKE 1 TABLET BY MOUTH ONCE DAILY (Patient taking differently: Place 75 mg into feeding tube daily. ) 30 tablet 2   diltiazem (CARDIZEM) 60 MG tablet Take 60 mg by mouth 2 (  two) times daily.      glipiZIDE (GLUCOTROL) 5 MG tablet Place 1 tablet (5 mg total) into feeding tube daily before breakfast.     hydrALAZINE (APRESOLINE) 50 MG tablet Place 1 tablet (50 mg total) into feeding tube 2 (two) times daily.     hydrochlorothiazide (HYDRODIURIL) 25 MG tablet Place 25 mg into feeding tube daily.   1   Hypromellose (GENTEAL MILD) 0.2 % SOLN Place 1 drop into both eyes 3 (three) times daily.     lisinopril (PRINIVIL,ZESTRIL) 10 MG tablet Place 1 tablet (10 mg total) into feeding tube daily. 30 tablet 2   meclizine (ANTIVERT) 25 MG tablet Place 1 tablet into feeding tube as needed.      Multiple Vitamin (MULTIVITAMIN) LIQD Place 15 mLs into feeding tube daily. 450 mL 1   Nutritional Supplements (FEEDING SUPPLEMENT, JEVITY 1.5 CAL/FIBER,) LIQD Place 237 mLs into feeding tube 4  (four) times daily. 237 mL 90   Oral Electrolytes (PEDIALYTE PO) Give by tube daily.      pantoprazole sodium (PROTONIX) 40 mg/20 mL PACK Place 20 mLs (40 mg total) into feeding tube 2 (two) times daily. 30 each 1   rOPINIRole (REQUIP) 2 MG tablet Place 1 tablet (2 mg total) into feeding tube 2 (two) times daily. 60 tablet 1   simethicone (MYLICON) 40 BX/4.3HW drops Take 1.2 mLs (80 mg total) by mouth 4 (four) times daily as needed for flatulence. If unable to tolerate orally, may administer via PEG tube. 90 mL 0   vitamin C (VITAMIN C) 250 MG tablet Place 1 tablet (250 mg total) into feeding tube 2 (two) times daily. 60 tablet 1   Water For Irrigation, Sterile (FREE WATER) SOLN Place 30 mLs into feeding tube every 4 (four) hours.     loperamide (IMODIUM) 1 MG/5ML solution Take 3 mg by mouth as needed for diarrhea or loose stools.     No current facility-administered medications for this visit.     Review of Systems  Constitutional: Positive for malaise/fatigue. Negative for chills, diaphoresis, fever and weight loss (up 1lb).       Feels "pretty good."  HENT: Negative for congestion, hearing loss, nosebleeds, sinus pain and sore throat.        Dry mouth.  Eyes: Positive for blurred vision (vision issues, chronic).  Respiratory: Negative for cough, sputum production, shortness of breath and wheezing.   Cardiovascular: Negative for chest pain, leg swelling and PND.  Gastrointestinal: Positive for diarrhea (2-3x daily). Negative for abdominal pain, blood in stool, constipation, heartburn, melena, nausea and vomiting.       Dysphagia. Drainage around G-tube.  Genitourinary: Negative for dysuria, frequency, hematuria and urgency.  Musculoskeletal: Positive for joint pain (arthritis, chronic pain). Negative for back pain and myalgias.  Skin: Negative for rash.       Dry skin.  Neurological: Positive for weakness (generalized). Negative for dizziness, tingling, sensory change and  headaches.  Endo/Heme/Allergies: Does not bruise/bleed easily.       Thyroid disease on Synthroid.   Psychiatric/Behavioral: Negative.  Negative for depression, memory loss and substance abuse. The patient is not nervous/anxious and does not have insomnia.   All other systems reviewed and are negative.  Performance status (ECOG): 2  Vitals Blood pressure (!) 150/68, pulse 66, temperature 97.6 F (36.4 C), resp. rate 18, height 5\' 3"  (1.6 m), weight 115 lb 15.4 oz (52.6 kg), SpO2 99 %.   Physical Exam  Constitutional: She is oriented to person, place,  and time. No distress.  HENT:  Head: Normocephalic and atraumatic.  Mouth/Throat: Oropharynx is clear and moist. No oropharyngeal exudate.  Short gray hair.  Mask.  Eyes: Pupils are equal, round, and reactive to light. Conjunctivae and EOM are normal. No scleral icterus.  Glasses.  Light brown eyes.  Neck: Normal range of motion. Neck supple. No JVD present.  Cardiovascular: Normal rate, regular rhythm and normal heart sounds.  No murmur heard. Pulmonary/Chest: Effort normal and breath sounds normal. No respiratory distress. She has no wheezes. She has no rales.  Abdominal: Soft. Bowel sounds are normal. She exhibits no distension and no mass. There is no abdominal tenderness. There is no rebound and no guarding.  G tube exit site is unremarkable.  Musculoskeletal:        General: Edema (trace) present. No tenderness.  Lymphadenopathy:    She has no cervical adenopathy.    She has no axillary adenopathy.       Right: No supraclavicular adenopathy present.       Left: No supraclavicular adenopathy present.  Neurological: She is alert and oriented to person, place, and time. Gait (uses cane to ambulate) abnormal.  Skin: Skin is warm and dry. No rash noted. She is not diaphoretic. No erythema. No pallor.  Psychiatric: She has a normal mood and affect. Her behavior is normal. Judgment and thought content normal.  Nursing note and vitals  reviewed.   Orders Only on 05/13/2019  Component Date Value Ref Range Status   Folate 05/12/2019 25.0  >5.9 ng/mL Final   Performed at [facility], [address] [place], [place] [address]   Iron 05/12/2019 28  28 - 170 ug/dL Final   TIBC 05/12/2019 319  250 - 450 ug/dL Final   Saturation Ratios 05/12/2019 9* 10.4 - 31.8 % Final   UIBC 05/12/2019 292  ug/dL Final   Performed at [facility], [address] [place], [place] [address]  Appointment on 05/12/2019  Component Date Value Ref Range Status   Ferritin 05/12/2019 73  11 - 307 ng/mL Final   Performed at [facility], [address] [place], [place] [address]   WBC 05/12/2019 6.2  4.0 - 10.5 K/uL Final   RBC 05/12/2019 3.18* 3.87 - 5.11 MIL/uL Final   Hemoglobin 05/12/2019 9.3* 12.0 - 15.0 g/dL Final   HCT 05/12/2019 28.7* 36.0 - 46.0 % Final   MCV 05/12/2019 90.3  80.0 - 100.0 fL Final   MCH 05/12/2019 29.2  26.0 - 34.0 pg Final   MCHC 05/12/2019 32.4  30.0 - 36.0 g/dL Final   RDW 05/12/2019 13.2  11.5 - 15.5 % Final   Platelets 05/12/2019 303  150 - 400 K/uL Final   nRBC 05/12/2019 0.0  0.0 - 0.2 % Final   Neutrophils Relative % 05/12/2019 70  % Final   Neutro Abs 05/12/2019 4.3  1.7 - 7.7 K/uL Final   Lymphocytes Relative 05/12/2019 17  % Final   Lymphs Abs 05/12/2019 1.0  0.7 - 4.0 K/uL Final   Monocytes Relative 05/12/2019 6  % Final   Monocytes Absolute 05/12/2019 0.4  0.1 - 1.0 K/uL Final   Eosinophils Relative 05/12/2019 6  % Final   Eosinophils Absolute 05/12/2019 0.4  0.0 - 0.5 K/uL Final   Basophils Relative 05/12/2019 1  % Final   Basophils Absolute 05/12/2019 0.0  0.0 - 0.1 K/uL Final   Immature Granulocytes 05/12/2019 0  % Final   Abs Immature Granulocytes 05/12/2019 0.02  0.00 - 0.07 K/uL Final   Performed at [facility], [address] [place], [place] [address]    Assessment:  MAIZIE GARNO Day is a 85 y.o. female withiron  deficiency anemia. She has been on oral iron x 1 year with continued decline in her counts. She has dysphagia and odynophagia. She is on PPI and carafate. Dietis modest. She denies pica.  Abdomen and pelvic CTon 02/19/2017 revealed no acute abnormality. She has mild sigmoid diverticulosis.  CBCon 02/26/2017 revealed a hematocrit of 25.3, hemoglobin 8.0, and MCV 83.2. Hemoccult studiesx 1 were positive in 11/19/2016 and negative x 2 in 01/2017. Ferritin was 5 with an iron saturation of 4%, and a TIBC of 487 on 01/30/2017. Normal studies included: creatinine (1.0), calcium, albumen, B12 (647), and TSH. Folic acid was 24.2 on 08/09/2015. Urinalysis revealed no blood on 01/23/2017 and 10/28/2018. SPEP was negative on 02/28/2017.  She received Venoferweekly x 3 (02/28/2017 - 03/14/2017), x 2 (04/19/2017 - 04/26/2017), x 2 (06/14/2017 - 06/21/2017), 11/21/2017, x 3 (01/09/2018 - 01/23/2018), 02/20/2018, x 2 (08/22/2018 - 09/01/2018), 11/26/2018, x3 (02/18/2019-03/04/2019).   Ferritin has been followed: 8 on 02/28/2017, 164 on 03/19/2017, 27 on 04/18/2017, 82 on 05/17/2017, 38 on 06/10/2017, 100 on 07/18/2017, 40 on 09/18/2017, 9 on 11/19/2017, 13 on 01/02/2018, 57 on 02/19/2018, 72 on 04/04/2018, 41 on 05/21/2018, 36 on 08/18/2018, 80 on 09/22/2018, 45 on 11/25/2018, 96 on 01/07/2019, 48 on 02/17/2019, 104 on 04/02/2019, and 73 on 05/12/2019.   EGDin 2016 revealed gastric ulcers and esophageal stricture which was dilated. Barium swallowon 11/15/2015 was normal. She has a history of mesenteric ischemia. 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 She was on Coumadin, and now on Plavix. Colonoscopyin 2008 revealed diverticulosis.   EGDon 03/28/2017 revealed a moderate-sized area of extrinsic compression was found in the mid esophagus from about 25-28 cm, no apparent defect or abnormality in the mucosa or esophageal wall. There was a 2 cm cratered esophageal ulcer. There was  segmental moderate mucosal changes characterized by granularity at the gastroesophageal junction. There was localized mild inflammation characterized by congestion (edema), depression and erythema was found on the greater curvature of the gastric body. Biopsies revealed no H pylori, dysplasia or malignancy. She is on pantoprazole and Carafate. She has esophageal spasmsimproved with Bentyl.  EGDon 06/20/2017 revealed a non-bleeding esophageal ulcer, gastritis, and a normal duodenum. Pathology revealed mild reactive gastropathy. Colonoscopyon 06/20/2017 revealed non-bleeding external hemorrhoids, diverticulosis in the sigmoid colon, and one 1 mm polyp at the recto-sigmoid colon. Pathology revealed a tubular adenoma Negative for high grade dysplasia or malignancy.  Upper EUSon 12/05/2017 revealed esophageal stenosis. Biopsy revealed CMV viral cytopathic effect.There was no evidence of malignancy. The EUS scope could not be advanced into the stenosis as it was too tight. There was loss of normal wall layers and diffusely hypoechoic measuring from 3 mm to 6 mm in thickness.   She was treated with valacyclovir x 28 days. CMV DNA quantitative by PCR was positive on 01/02/2018 and <200 IU/ml on 01/30/2018.  Upper GI endoscopyon 04/15/2018 revealed extrinsic compression in the mid esophagus. Esophageal biopsy revealed detached stratified squamous epithelium with focal reactive parakeratosis. There was no dysplasia or malignancy. There was a benign appearing esophageal stenosis. She underwentEGD with esophageal dilatationon 05/01/2018 and [facility].  Chest CT on 05/28/2018 revealed distal esophageal stricture at the GE junction with associated mild submucosal edema, possibly reflecting a post infectious/inflammatory stricture, underlying neoplasm not entirely excluded. There was no extrinsic  compression.  She underwent PEG tube placementon 06/11/2018 and exchange on  03/25/2019. She takes little by mouth.  Symptomatically, she feels pretty good.  She denies any bleeding.  Exam is unremarkable.  Plan: 1.   Review labs from 05/12/2019. 2. Iron deficiency anemia Hematocrit 28.7.  Hemoglobin is 9.3. MCV 90.3.             Ferritin is 73.  Iron saturation 9% (low), Discuss drop in hemogloblin and plan for addition IV iron. Venofer today and weekly x 2. 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             Patient's primary nutrition is through her G-tube. 4. G tube leak, resolved             G-tube has been replaced.  Patient reassured regarding slight drainage around G-tube site. 5.   RTC in 6 weeks for labs (CBC with diff, BMP, ferritin, iron studies). 6.   RTC in 3 months for MD assessment, labs (CBC with diff, ferritin, sed rate-day before) and+/-Venofer.  I discussed the assessment and treatment plan with the patient.  The patient was provided an opportunity to ask questions and all were answered.  The patient agreed with the plan and demonstrated an understanding of the instructions.  The patient was advised to call back if the symptoms worsen or if the condition fails to improve as anticipated.  I provided 15 minutes of face-to-face time during this this encounter and > 50% was spent counseling as documented under my assessment and plan.    Lequita Asal, MD, PhD    05/13/2019, 10:06 AM  I, Molly Dorshimer, am acting as Education administrator for [employer]. Mike Gip, MD, PhD.  I, Roston Grunewald C. Mike Gip, MD, have reviewed the above documentation for accuracy and completeness, and I agree with the above.

## 2019-05-12 ENCOUNTER — Inpatient Hospital Stay: Payer: Medicare Other | Attending: Hematology and Oncology

## 2019-05-12 DIAGNOSIS — D509 Iron deficiency anemia, unspecified: Secondary | ICD-10-CM | POA: Insufficient documentation

## 2019-05-12 DIAGNOSIS — D508 Other iron deficiency anemias: Secondary | ICD-10-CM

## 2019-05-12 DIAGNOSIS — Z931 Gastrostomy status: Secondary | ICD-10-CM

## 2019-05-12 LAB — CBC WITH DIFFERENTIAL/PLATELET
Abs Immature Granulocytes: 0.02 10*3/uL (ref 0.00–0.07)
Basophils Absolute: 0 10*3/uL (ref 0.0–0.1)
Basophils Relative: 1 %
Eosinophils Absolute: 0.4 10*3/uL (ref 0.0–0.5)
Eosinophils Relative: 6 %
HCT: 28.7 % — ABNORMAL LOW (ref 36.0–46.0)
Hemoglobin: 9.3 g/dL — ABNORMAL LOW (ref 12.0–15.0)
Immature Granulocytes: 0 %
Lymphocytes Relative: 17 %
Lymphs Abs: 1 10*3/uL (ref 0.7–4.0)
MCH: 29.2 pg (ref 26.0–34.0)
MCHC: 32.4 g/dL (ref 30.0–36.0)
MCV: 90.3 fL (ref 80.0–100.0)
Monocytes Absolute: 0.4 10*3/uL (ref 0.1–1.0)
Monocytes Relative: 6 %
Neutro Abs: 4.3 10*3/uL (ref 1.7–7.7)
Neutrophils Relative %: 70 %
Platelets: 303 10*3/uL (ref 150–400)
RBC: 3.18 MIL/uL — ABNORMAL LOW (ref 3.87–5.11)
RDW: 13.2 % (ref 11.5–15.5)
WBC: 6.2 10*3/uL (ref 4.0–10.5)
nRBC: 0 % (ref 0.0–0.2)

## 2019-05-12 LAB — FERRITIN: Ferritin: 73 ng/mL (ref 11–307)

## 2019-05-13 ENCOUNTER — Other Ambulatory Visit: Payer: Self-pay

## 2019-05-13 ENCOUNTER — Inpatient Hospital Stay: Payer: Medicare Other

## 2019-05-13 ENCOUNTER — Encounter: Payer: Self-pay | Admitting: Hematology and Oncology

## 2019-05-13 ENCOUNTER — Other Ambulatory Visit: Payer: Self-pay | Admitting: Hematology and Oncology

## 2019-05-13 ENCOUNTER — Inpatient Hospital Stay (HOSPITAL_BASED_OUTPATIENT_CLINIC_OR_DEPARTMENT_OTHER): Payer: Medicare Other | Admitting: Hematology and Oncology

## 2019-05-13 VITALS — BP 150/68 | HR 66 | Temp 97.6°F | Resp 18 | Ht 63.0 in | Wt 116.0 lb

## 2019-05-13 VITALS — BP 165/58 | HR 66 | Temp 96.8°F | Resp 18

## 2019-05-13 DIAGNOSIS — D508 Other iron deficiency anemias: Secondary | ICD-10-CM

## 2019-05-13 DIAGNOSIS — D509 Iron deficiency anemia, unspecified: Secondary | ICD-10-CM | POA: Diagnosis not present

## 2019-05-13 DIAGNOSIS — D649 Anemia, unspecified: Secondary | ICD-10-CM

## 2019-05-13 LAB — IRON AND TIBC
Iron: 28 ug/dL (ref 28–170)
Saturation Ratios: 9 % — ABNORMAL LOW (ref 10.4–31.8)
TIBC: 319 ug/dL (ref 250–450)
UIBC: 292 ug/dL

## 2019-05-13 LAB — FOLATE: Folate: 25 ng/mL

## 2019-05-13 LAB — VITAMIN B12: Vitamin B-12: 706 pg/mL (ref 180–914)

## 2019-05-13 MED ORDER — IRON SUCROSE 20 MG/ML IV SOLN
200.0000 mg | Freq: Once | INTRAVENOUS | Status: AC
  Administered 2019-05-13: 200 mg via INTRAVENOUS

## 2019-05-13 MED ORDER — SODIUM CHLORIDE 0.9 % IV SOLN: 200.0000 mg | INTRAVENOUS | Status: DC

## 2019-05-13 MED ORDER — SODIUM CHLORIDE 0.9 % IV SOLN
Freq: Once | INTRAVENOUS | Status: AC
  Administered 2019-05-13: 11:00:00 via INTRAVENOUS
  Filled 2019-05-13: qty 250

## 2019-05-13 NOTE — Progress Notes (Signed)
Patient c/o bilateral ankles edema.

## 2019-05-13 NOTE — Patient Instructions (Signed)

## 2019-05-14 ENCOUNTER — Inpatient Hospital Stay: Payer: Medicare Other

## 2019-05-14 NOTE — Progress Notes (Signed)
Nutrition Follow-up:  Patient followed by Dr. Mike Gip for iron deficiency anemia.  Patient with history of CMV esophagitis, esophageal stenosis.  PEG placed on 06/11/18 due to unable to take enough oral intake.  Spoke with patient via phone this am for nutrition follow-up.  Patient reports she continues to take 4 cartons of jevity 1.5 daily and promod 47ml BID.  Reports has "loose" stools about 2 times daily and about 1 a week may have diarrhea requiring imodium.  Imodium improves diarrhea.  Water flush the same about 1234 ml (formula and water flush) not counting medication.   Noted PEG exchange on 03/25/2019 (18 French gastrostomy tube).      Medications: reviewed  Labs: reviewed  Anthropometrics:   Weight 115 lb 15.4 oz on 8/13 increased from 114 lb on 5/20.     Estimated Energy Needs  Kcals: 0981-1914 Protein: 78-91 g Fluid: 1.5 L  NUTRITION DIAGNOSIS: Inadequate oral intake continues   INTERVENTION:  Previously discussed options for trying to improve diarrhea.  Patient wants to continue on current regimen of 4 jevity 1.5 daily and 2ml promod BID daily.    Tube feeding provides 1620 calories, 80 g protein and 1254ml free water (not including water with medications Patient has good supply of supplies and formula from [place].   Has RD contact information and will reach out if needed     MONITORING, EVALUATION, GOAL: tube feeding tolerance, weight   NEXT VISIT: phone f/u Nov 23rd.  Patient can contact sooner if needed  Mashal Slavick B. Zenia Resides, [place], [place] Registered Dietitian [phone] (pager)

## 2019-05-20 ENCOUNTER — Ambulatory Visit (INDEPENDENT_AMBULATORY_CARE_PROVIDER_SITE_OTHER): Payer: Medicare Other

## 2019-05-20 ENCOUNTER — Other Ambulatory Visit: Payer: Self-pay

## 2019-05-20 ENCOUNTER — Encounter (INDEPENDENT_AMBULATORY_CARE_PROVIDER_SITE_OTHER): Payer: Self-pay | Admitting: Nurse Practitioner

## 2019-05-20 ENCOUNTER — Ambulatory Visit (INDEPENDENT_AMBULATORY_CARE_PROVIDER_SITE_OTHER): Payer: Medicare Other | Admitting: Nurse Practitioner

## 2019-05-20 VITALS — BP 175/54 | HR 74 | Resp 12 | Ht 63.0 in | Wt 117.0 lb

## 2019-05-20 DIAGNOSIS — E785 Hyperlipidemia, unspecified: Secondary | ICD-10-CM | POA: Diagnosis not present

## 2019-05-20 DIAGNOSIS — I10 Essential (primary) hypertension: Secondary | ICD-10-CM | POA: Diagnosis not present

## 2019-05-20 DIAGNOSIS — K559 Vascular disorder of intestine, unspecified: Secondary | ICD-10-CM

## 2019-05-21 ENCOUNTER — Other Ambulatory Visit: Payer: Self-pay

## 2019-05-21 ENCOUNTER — Ambulatory Visit: Payer: Medicare Other

## 2019-05-21 ENCOUNTER — Emergency Department
Admission: EM | Admit: 2019-05-21 | Discharge: 2019-05-21 | Disposition: A | Discharge status: Home / Self Care | Payer: Medicare Other | Attending: Emergency Medicine | Admitting: Emergency Medicine

## 2019-05-21 ENCOUNTER — Emergency Department: Payer: Medicare Other

## 2019-05-21 ENCOUNTER — Encounter: Payer: Self-pay | Admitting: Emergency Medicine

## 2019-05-21 DIAGNOSIS — R1013 Epigastric pain: Secondary | ICD-10-CM | POA: Diagnosis not present

## 2019-05-21 DIAGNOSIS — Z79899 Other long term (current) drug therapy: Secondary | ICD-10-CM | POA: Insufficient documentation

## 2019-05-21 DIAGNOSIS — Z7984 Long term (current) use of oral hypoglycemic drugs: Secondary | ICD-10-CM | POA: Insufficient documentation

## 2019-05-21 DIAGNOSIS — R079 Chest pain, unspecified: Secondary | ICD-10-CM | POA: Diagnosis present

## 2019-05-21 DIAGNOSIS — E119 Type 2 diabetes mellitus without complications: Secondary | ICD-10-CM | POA: Diagnosis not present

## 2019-05-21 DIAGNOSIS — Z7902 Long term (current) use of antithrombotics/antiplatelets: Secondary | ICD-10-CM | POA: Diagnosis not present

## 2019-05-21 DIAGNOSIS — I10 Essential (primary) hypertension: Secondary | ICD-10-CM | POA: Diagnosis not present

## 2019-05-21 LAB — BASIC METABOLIC PANEL
Anion gap: 8 (ref 5–15)
BUN: 36 mg/dL — ABNORMAL HIGH (ref 8–23)
CO2: 28 mmol/L (ref 22–32)
Calcium: 8.7 mg/dL — ABNORMAL LOW (ref 8.9–10.3)
Chloride: 96 mmol/L — ABNORMAL LOW (ref 98–111)
Creatinine, Ser: 0.75 mg/dL (ref 0.44–1.00)
GFR calc Af Amer: >60 mL/min (ref >60)
GFR calc non Af Amer: >60 mL/min (ref >60)
Glucose, Bld: 118 mg/dL — ABNORMAL HIGH (ref 70–99)
Potassium: 4.2 mmol/L (ref 3.5–5.1)
Sodium: 132 mmol/L — ABNORMAL LOW (ref 135–145)

## 2019-05-21 LAB — CBC
HCT: 29.7 % — ABNORMAL LOW (ref 36.0–46.0)
Hemoglobin: 9.5 g/dL — ABNORMAL LOW (ref 12.0–15.0)
MCH: 29 pg (ref 26.0–34.0)
MCHC: 32 g/dL (ref 30.0–36.0)
MCV: 90.5 fL (ref 80.0–100.0)
Platelets: 308 10*3/uL (ref 150–400)
RBC: 3.28 MIL/uL — ABNORMAL LOW (ref 3.87–5.11)
RDW: 13.5 % (ref 11.5–15.5)
WBC: 7.1 10*3/uL (ref 4.0–10.5)
nRBC: 0 % (ref 0.0–0.2)

## 2019-05-21 LAB — TROPONIN I (HIGH SENSITIVITY): Troponin I (High Sensitivity): 12 ng/L (ref <18)

## 2019-05-21 MED ORDER — SUCRALFATE 1 GM/10ML PO SUSP: 1.0000 g | Freq: Four times a day (QID) | ORAL | 1 refills | Status: DC

## 2019-05-21 NOTE — Discharge Instructions (Addendum)
Please seek medical attention for any high fevers, chest pain, shortness of breath, change in behavior, persistent vomiting, bloody stool or any other new or concerning symptoms.  

## 2019-05-21 NOTE — ED Notes (Signed)
Dr Archie Balboa at bedside to discuss lab finding and discharge instructions.

## 2019-05-21 NOTE — ED Provider Notes (Signed)
[facility] Emergency Department Provider Note  ____________________________________________   I have reviewed the triage vital signs and the nursing notes.   HISTORY  Chief Complaint Chest Pain and Abdominal Pain   History limited by: Not Limited   HPI [name] Day is a 85 y.o. female who presents to the emergency department today because of concerns for epigastric pain.  She states the pain started yesterday.  It has been intermittent since it started.  She has not noticed any pattern to the pain.  It does radiate up her esophagus.  She denies any associated shortness of breath or fevers.  She has had some heaves.  Denies any change in defecation.  Patient does have feeding tube although has not noticed any change with her feeds or with her feeding tube.   Records reviewed. Per medical record review patient has a history of gastrostomy tube.   Past Medical History:  Diagnosis Date  . Anemia    IRON INFUSIONS  . Aortic valve sclerosis   . Arrhythmia   . Arthritis    osteoarthritis  . Basal cell carcinoma of skin   . Brain tumor ([place])   . Brain tumor ([place])   . Cervical spine disease   . Diabetes mellitus without complication ([place])   . Dysrhythmia    sinus arrhythmia  . Esophageal stricture    severe, led to feeding tube placement in Sept 2019  . Esophageal ulcer without bleeding   . Gastrostomy tube dependent ([place])    DOES NOT EAT OR DRINK   . GERD (gastroesophageal reflux disease)   . History of kidney stones   . Hyperlipemia   . Hypertension   . Kidney stones   . Leaky heart valve   . Meningioma ([place])   . 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 Pulmonary hypertension ([place])   . RLS (restless legs syndrome)   . Vertigo     Patient Active Problem List   Diagnosis Date Noted  . Chest pain, atypical 03/18/2019  . Tricuspid regurgitation 02/13/2019  . Heart palpitations 02/12/2019  . Hyponatremia 06/23/2018  . Protein-calorie  malnutrition, severe 06/12/2018  . Esophageal stricture 06/11/2018  . Weight loss 05/29/2018  . Dysphagia 05/29/2018  . Esophageal stenosis 05/05/2018  . Hyperlipemia 04/04/2018  . Itching 01/08/2018  . Medication monitoring encounter 01/08/2018  . Paronychia of toe 01/08/2018  . Esophagitis, CMV ([place]) 01/01/2018  . 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 Hypertension 04/30/2017  . Mesenteric ischemia ([place]) 03/01/2017  . Iron deficiency anemia 02/28/2017  . PAD (peripheral artery disease) ([place]) 02/05/2017  . Hyperlipidemia type II 04/04/2016  . Pulmonary hypertension ([place]) 01/04/2015  . Generalized OA 12/13/2014  . Leg edema 12/13/2014  . Aortic valve sclerosis 02/19/2014  . Diabetes mellitus ([place]) 12/25/2013  . Acute, but ill-defined, cerebrovascular disease 12/25/2013  . Benign essential hypertension 12/25/2013  . Unilateral primary osteoarthritis, unspecified knee 12/25/2013  . Personal history of other malignant neoplasm of skin 05/28/2012    Past Surgical History:  Procedure Laterality Date  . ABDOMINAL HYSTERECTOMY    . APPENDECTOMY    . ARTHROGRAM KNEE Left   . BACK SURGERY     CERVIVAL NECK FUSION  . CATARACT EXTRACTION W/PHACO Left 11/11/2018   Procedure: CATARACT EXTRACTION PHACO AND INTRAOCULAR LENS PLACEMENT (IOC) LEFT, DIABETIC;  Surgeon: Birder Robson, MD;  Location: [facility] ORS;  Service: Ophthalmology;  Laterality: Left;  [place]  00:41 CDE 6.41 Fluid pack lot # [id number]  . COLONOSCOPY WITH  PROPOFOL N/A 06/20/2017   Procedure: COLONOSCOPY WITH PROPOFOL;  Surgeon: Lollie Sails, MD;  Location: [facility] ENDOSCOPY;  Service: Endoscopy;  Laterality: N/A;  . ESOPHAGOGASTRODUODENOSCOPY (EGD) WITH PROPOFOL N/A 03/14/2015   Procedure: ESOPHAGOGASTRODUODENOSCOPY (EGD) WITH PROPOFOL;  Surgeon: Josefine Class, MD;  Location: [facility] ENDOSCOPY;  Service: Endoscopy;  Laterality: N/A;  . ESOPHAGOGASTRODUODENOSCOPY (EGD) WITH PROPOFOL N/A 03/28/2017   Procedure:  ESOPHAGOGASTRODUODENOSCOPY (EGD) WITH PROPOFOL;  Surgeon: Lollie Sails, MD;  Location: [facility] ENDOSCOPY;  Service: Endoscopy;  Laterality: N/A;  . ESOPHAGOGASTRODUODENOSCOPY (EGD) WITH PROPOFOL N/A 06/20/2017   Procedure: ESOPHAGOGASTRODUODENOSCOPY (EGD) WITH PROPOFOL;  Surgeon: Lollie Sails, MD;  Location: [facility] ENDOSCOPY;  Service: Endoscopy;  Laterality: N/A;  . ESOPHAGOGASTRODUODENOSCOPY (EGD) WITH PROPOFOL N/A 10/21/2017   Procedure: ESOPHAGOGASTRODUODENOSCOPY (EGD) WITH PROPOFOL;  Surgeon: Lollie Sails, MD;  Location: [facility] ENDOSCOPY;  Service: Endoscopy;  Laterality: N/A;  . ESOPHAGOGASTRODUODENOSCOPY (EGD) WITH PROPOFOL N/A 04/15/2018   Procedure: ESOPHAGOGASTRODUODENOSCOPY (EGD) WITH PROPOFOL;  Surgeon: Lollie Sails, MD;  Location: [facility] ENDOSCOPY;  Service: Endoscopy;  Laterality: N/A;  . ESOPHAGUS SURGERY     CLOSURE  . EYE SURGERY    . HYSTERECTOMY ABDOMINAL WITH SALPINGECTOMY    . IR GASTROSTOMY TUBE MOD SED  06/11/2018  . IR GASTROSTOMY TUBE REMOVAL  03/25/2019  . PERIPHERAL VASCULAR CATHETERIZATION N/A 01/23/2016   Procedure: Visceral Venography;  Surgeon: Algernon Huxley, MD;  Location: [place] CV LAB;  Service: Cardiovascular;  Laterality: N/A;  . PERIPHERAL VASCULAR CATHETERIZATION  01/23/2016   Procedure: Peripheral Vascular Intervention;  Surgeon: Algernon Huxley, MD;  Location: [place] CV LAB;  Service: Cardiovascular;;  . UPPER ESOPHAGEAL ENDOSCOPIC ULTRASOUND (EUS) N/A 12/05/2017   Procedure: UPPER ESOPHAGEAL ENDOSCOPIC ULTRASOUND (EUS);  Surgeon: Jola Schmidt, MD;  Location: [facility] ENDOSCOPY;  Service: Endoscopy;  Laterality: N/A;  . VISCERAL ANGIOGRAPHY N/A 03/18/2017   Procedure: Visceral Angiography;  Surgeon: Algernon Huxley, MD;  Location: [place] CV LAB;  Service: Cardiovascular;  Laterality: N/A;  . VISCERAL ARTERY INTERVENTION N/A 03/18/2017   Procedure: Visceral Artery Intervention;  Surgeon: Algernon Huxley, MD;  Location: [place] CV LAB;   Service: Cardiovascular;  Laterality: N/A;    Prior to Admission medications   Medication Sig Start Date End Date Taking? Authorizing Provider  acetaminophen (TYLENOL) 160 MG/5ML solution Place 20.3 mLs (650 mg total) into feeding tube every 6 (six) hours as needed for mild pain. 06/13/18   Demetrios Loll, MD  allopurinol (ZYLOPRIM) 100 MG tablet Place 100 mg into feeding tube daily.    [provider]  Amino Acids-Protein Hydrolys (FEEDING SUPPLEMENT, PRO-STAT SUGAR FREE 64,) LIQD Place 30 mLs into feeding tube daily. 06/13/18   Demetrios Loll, MD  bacitracin ointment Apply to affected area twice a day 11/21/18 11/21/19  Alfred Levins, [place], MD  clopidogrel (PLAVIX) 75 MG tablet TAKE 1 TABLET BY MOUTH ONCE DAILY Patient taking differently: Place 75 mg into feeding tube daily.  10/07/18   Algernon Huxley, MD  diltiazem (CARDIZEM) 60 MG tablet Take 60 mg by mouth 2 (two) times daily.  02/12/19 02/12/20  [provider]  glipiZIDE (GLUCOTROL) 5 MG tablet Place 1 tablet (5 mg total) into feeding tube daily before breakfast. 06/13/18   Demetrios Loll, MD  hydrALAZINE (APRESOLINE) 50 MG tablet Place 1 tablet (50 mg total) into feeding tube 2 (two) times daily. 06/13/18   Demetrios Loll, MD  hydrochlorothiazide (HYDRODIURIL) 25 MG tablet Place 25 mg into feeding tube daily.  07/21/18   [provider]  Hypromellose (GENTEAL MILD) 0.2 % SOLN Place 1 drop into both eyes 3 (three) times daily.    [provider]  lisinopril (PRINIVIL,ZESTRIL) 10 MG tablet Place 1 tablet (10 mg total) into feeding tube daily. 06/26/18   Dustin Flock, MD  loperamide (IMODIUM) 1 MG/5ML solution Take 3 mg by mouth as needed for diarrhea or loose stools.    [provider]  meclizine (ANTIVERT) 25 MG tablet Place 1 tablet into feeding tube as needed.  01/26/19   [provider]  Multiple Vitamin (MULTIVITAMIN) LIQD Place 15 mLs into feeding tube daily. 06/13/18   Demetrios Loll, MD  Nutritional Supplements  (FEEDING SUPPLEMENT, JEVITY 1.5 CAL/FIBER,) LIQD Place 237 mLs into feeding tube 4 (four) times daily. 06/25/18   Dustin Flock, MD  Oral Electrolytes (PEDIALYTE PO) Give by tube daily.     [provider]  pantoprazole sodium (PROTONIX) 40 mg/20 mL PACK Place 20 mLs (40 mg total) into feeding tube 2 (two) times daily. 06/25/18   Dustin Flock, MD  rOPINIRole (REQUIP) 2 MG tablet Place 1 tablet (2 mg total) into feeding tube 2 (two) times daily. 06/13/18   Demetrios Loll, MD  simethicone [facility]) 40 HY/0.7PX drops Take 1.2 mLs (80 mg total) by mouth 4 (four) times daily as needed for flatulence. If unable to tolerate orally, may administer via PEG tube. 09/22/18   Karen Kitchens, NP  vitamin C (VITAMIN C) 250 MG tablet Place 1 tablet (250 mg total) into feeding tube 2 (two) times daily. 06/13/18   Demetrios Loll, MD  Water For Irrigation, Sterile (FREE WATER) SOLN Place 30 mLs into feeding tube every 4 (four) hours. 06/25/18   Dustin Flock, MD    Allergies Gabapentin, Lipitor [atorvastatin], Mevacor [lovastatin], Milk-related compounds, Septra [sulfamethoxazole-trimethoprim], Statins, Sulfur, and Zocor [simvastatin]  Family History  Problem Relation Age of Onset  . Stroke Mother   . Hypertension Mother   . Cancer Father   . Heart attack Sister   . Diabetes Sister   . Heart attack Brother     Social History Social History   Tobacco Use  . Smoking status: Never Smoker  . Smokeless tobacco: Never Used  Substance Use Topics  . Alcohol use: Never    Frequency: Never  . Drug use: Never    Review of Systems Constitutional: No fever/chills Eyes: No visual changes. ENT: No sore throat. Cardiovascular: Positive for chest pain. Respiratory: Denies shortness of breath. Gastrointestinal: Positive for epigastric pain.  Genitourinary: Negative for dysuria. Musculoskeletal: Negative for back pain. Skin: Negative for rash. Neurological: Negative for headaches, focal weakness or  numbness.  ____________________________________________   PHYSICAL EXAM:  VITAL SIGNS: ED Triage Vitals  Enc Vitals Group     BP 05/21/19 1744 (!) 178/52     Pulse Rate 05/21/19 1744 81     Resp 05/21/19 1744 16     Temp 05/21/19 1744 98.5 F (36.9 C)     Temp Source 05/21/19 1744 Oral     SpO2 05/21/19 1744 100 %     Weight 05/21/19 1746 117 lb (53.1 kg)     Height 05/21/19 1746 5\' 3"  (1.6 m)     Head Circumference --      Peak Flow --      Pain Score 05/21/19 1745 5   Constitutional: Alert and oriented.  Eyes: Conjunctivae are normal.  ENT      Head: Normocephalic and atraumatic.      Nose: No congestion/rhinnorhea.  Mouth/Throat: Mucous membranes are moist.      Neck: No stridor. Hematological/Lymphatic/Immunilogical: No cervical lymphadenopathy. Cardiovascular: Normal rate, regular rhythm.  No murmurs, rubs, or gallops.  Respiratory: Normal respiratory effort without tachypnea nor retractions. Breath sounds are clear and equal bilaterally. No wheezes/rales/rhonchi. Gastrointestinal: Soft and non tender. Gtube insertion site without erythema.  Genitourinary: Deferred Musculoskeletal: Normal range of motion in all extremities. No lower extremity edema. Neurologic:  Normal speech and language. No gross focal neurologic deficits are appreciated.  Skin:  Skin is warm, dry and intact. No rash noted. Psychiatric: Mood and affect are normal. Speech and behavior are normal. Patient exhibits appropriate insight and judgment.  ____________________________________________    LABS (pertinent positives/negatives)  Trop hs 12 CBC wbc 7.1, hgb 9.5, plt 308 BMP na 132, cl 96, glu 118, cr 0.75  ____________________________________________   EKG  I, Nance Pear, attending physician, personally viewed and interpreted this EKG  EKG Time: 1748 Rate: 79 Rhythm: normal sinus rhythm Axis: normal Intervals: qtc 474 QRS: narrow ST changes: no st elevation Impression:  normal ekg   ____________________________________________    RADIOLOGY  CXR Stable cardiomegaly with no acute findings ____________________________________________   PROCEDURES  Procedures  ____________________________________________   INITIAL IMPRESSION / ASSESSMENT AND PLAN / ED COURSE  Pertinent labs & imaging results that were available during my care of the patient were reviewed by me and considered in my medical decision making (see chart for details).   Patient presented to the emergency department today because of concerns for epigastric pain with radiation up her esophagus for the past 2 days.  Patient's work-up without elevation of troponin.  No concerning leukocytosis or electrolyte abnormality.  Patient's description of the pain I do wonder if she is suffering from esophagitis.  I discussed this with the patient.  Will plan on adding sucralfate to patient's medication regimen.  We did discuss return precautions.   ____________________________________________   FINAL CLINICAL IMPRESSION(S) / ED DIAGNOSES  Final diagnoses:  Epigastric pain     Note: This dictation was prepared with Dragon dictation. Any transcriptional errors that result from this process are unintentional     Nance Pear, MD 05/21/19 2303

## 2019-05-21 NOTE — ED Triage Notes (Signed)
Pt in via POV, reports epigastric pain that run up chest and into bilateral jaws.  Pt w/ chronic feeding tube, does not take anything by mouth.  Vitals WDL, NAD noted at this time.

## 2019-05-22 ENCOUNTER — Inpatient Hospital Stay: Payer: Medicare Other

## 2019-05-22 ENCOUNTER — Ambulatory Visit: Payer: Medicare Other

## 2019-05-22 VITALS — BP 160/73 | HR 68 | Temp 97.5°F | Resp 16

## 2019-05-22 DIAGNOSIS — D509 Iron deficiency anemia, unspecified: Secondary | ICD-10-CM

## 2019-05-22 MED ORDER — SODIUM CHLORIDE 0.9 % IV SOLN
Freq: Once | INTRAVENOUS | Status: AC
  Administered 2019-05-22: 14:00:00 via INTRAVENOUS
  Filled 2019-05-22: qty 250

## 2019-05-22 MED ORDER — IRON SUCROSE 20 MG/ML IV SOLN
200.0000 mg | Freq: Once | INTRAVENOUS | Status: AC
  Administered 2019-05-22: 200 mg via INTRAVENOUS
  Filled 2019-05-22: qty 10

## 2019-05-22 NOTE — Progress Notes (Signed)
Went to the ED with severe epigastric pain radiating into her jaw. Pain is worse with her bolus tube feedings. Ambulation can make it worse. ED ruled out cardiac issue. They also scanned her abdomen because she has a stent in place.Patient waiting for Dr Reinaldo Berber office to work her in. Denies any pain at the moment. Thought she needed the iron infusion today.

## 2019-05-26 NOTE — Progress Notes (Signed)
SUBJECTIVE:  Patient ID: [name], female    DOB: 12-06-1928, 85 y.o.   MRN: [id number] Chief Complaint  Patient presents with   Follow-up    HPI  [name] is a 85 y.o. 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  The patient denies abdominal pain or postprandial symptoms.  The patient denies weight loss as well as nausea.  The patient does not substantiate food fear, particular foods do not seem to aggravate or alleviate the symptoms.  The patient denies bloody bowel movements or diarrhea.  The patient has a history of colonoscopy which was not diagnostic.  No history of peptic ulcer disease.   Prior peripheral angiograms of SMA  The patient denies amaurosis fugax or recent TIA symptoms. There are no recent neurological changes noted. The patient denies claudication symptoms or rest pain symptoms. The patient denies history of DVT, PE or superficial thrombophlebitis. 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  Patent proximal SMA stent.  Increase in the SMA velocities from 418 to 511 from 11/18/2018.  Elevated velocities in the common hepatic and splenic veins.   Past Medical History:  Diagnosis Date   Anemia    IRON INFUSIONS   Aortic valve sclerosis    Arrhythmia    Arthritis    osteoarthritis   Basal cell carcinoma of skin    Brain tumor ([place])    Brain tumor ([place])    Cervical spine disease    Diabetes mellitus without complication (HCC)    Dysrhythmia    sinus arrhythmia   Esophageal stricture    severe, led to feeding tube placement in Sept 2019   Esophageal ulcer without bleeding    Gastrostomy tube dependent (HCC)    DOES NOT EAT OR DRINK    GERD (gastroesophageal reflux disease)    History of kidney stones    Hyperlipemia    Hypertension      Kidney stones    Leaky heart valve    Meningioma (HCC)    Occlusive mesenteric ischemia (HCC)    Pulmonary hypertension (HCC)    RLS (restless legs syndrome)    Vertigo     Past Surgical History:  Procedure Laterality Date   ABDOMINAL HYSTERECTOMY     APPENDECTOMY     ARTHROGRAM KNEE Left    BACK SURGERY     CERVIVAL NECK FUSION   CATARACT EXTRACTION W/PHACO Left 11/11/2018   Procedure: CATARACT EXTRACTION PHACO AND INTRAOCULAR LENS PLACEMENT ([place]) LEFT, DIABETIC;  Surgeon: Birder Robson, MD;  Location: [facility] ORS;  Service: Ophthalmology;  Laterality: Left;  [place]  00:41 CDE 6.41 Fluid pack lot # [id number]   COLONOSCOPY WITH PROPOFOL N/A 06/20/2017   Procedure: COLONOSCOPY WITH PROPOFOL;  Surgeon: Lollie Sails, MD;  Location: [facility] ENDOSCOPY;  Service: Endoscopy;  Laterality: N/A;   ESOPHAGOGASTRODUODENOSCOPY (EGD) WITH PROPOFOL N/A 03/14/2015   Procedure: ESOPHAGOGASTRODUODENOSCOPY (EGD) WITH PROPOFOL;  Surgeon: Josefine Class, MD;  Location: [facility] ENDOSCOPY;  Service: Endoscopy;  Laterality: N/A;   ESOPHAGOGASTRODUODENOSCOPY (EGD) WITH PROPOFOL N/A 03/28/2017   Procedure: ESOPHAGOGASTRODUODENOSCOPY (EGD) WITH PROPOFOL;  Surgeon: Lollie Sails, MD;  Location: [facility] ENDOSCOPY;  Service: Endoscopy;  Laterality: N/A;   ESOPHAGOGASTRODUODENOSCOPY (EGD) WITH PROPOFOL N/A 06/20/2017   Procedure: ESOPHAGOGASTRODUODENOSCOPY (EGD) WITH PROPOFOL;  Surgeon: Lollie Sails, MD;  Location: [facility] ENDOSCOPY;  Service: Endoscopy;  Laterality: N/A;   ESOPHAGOGASTRODUODENOSCOPY (EGD) WITH PROPOFOL N/A 10/21/2017   Procedure: ESOPHAGOGASTRODUODENOSCOPY (EGD) WITH PROPOFOL;  Surgeon: Lollie Sails, MD;  Location: [facility] ENDOSCOPY;  Service: Endoscopy;  Laterality: N/A;   ESOPHAGOGASTRODUODENOSCOPY (EGD) WITH PROPOFOL N/A 04/15/2018   Procedure: ESOPHAGOGASTRODUODENOSCOPY (EGD) WITH PROPOFOL;  Surgeon: Lollie Sails, MD;  Location: [facility] ENDOSCOPY;  Service: Endoscopy;   Laterality: N/A;   ESOPHAGUS SURGERY     CLOSURE   EYE SURGERY     HYSTERECTOMY ABDOMINAL WITH SALPINGECTOMY     IR GASTROSTOMY TUBE MOD SED  06/11/2018   IR GASTROSTOMY TUBE REMOVAL  03/25/2019   PERIPHERAL VASCULAR CATHETERIZATION N/A 01/23/2016   Procedure: Visceral Venography;  Surgeon: Algernon Huxley, MD;  Location: [place] CV LAB;  Service: Cardiovascular;  Laterality: N/A;   PERIPHERAL VASCULAR CATHETERIZATION  01/23/2016   Procedure: Peripheral Vascular Intervention;  Surgeon: Algernon Huxley, MD;  Location: [place] CV LAB;  Service: Cardiovascular;;   UPPER ESOPHAGEAL ENDOSCOPIC ULTRASOUND (EUS) N/A 12/05/2017   Procedure: UPPER ESOPHAGEAL ENDOSCOPIC ULTRASOUND (EUS);  Surgeon: Jola Schmidt, MD;  Location: [facility] ENDOSCOPY;  Service: Endoscopy;  Laterality: N/A;   VISCERAL ANGIOGRAPHY N/A 03/18/2017   Procedure: Visceral Angiography;  Surgeon: Algernon Huxley, MD;  Location: [place] CV LAB;  Service: Cardiovascular;  Laterality: N/A;   VISCERAL ARTERY INTERVENTION N/A 03/18/2017   Procedure: Visceral Artery Intervention;  Surgeon: Algernon Huxley, MD;  Location: [place] CV LAB;  Service: Cardiovascular;  Laterality: N/A;    Social History   Socioeconomic History   Marital status: Widowed    Spouse name: Not on file   Number of children: Not on file   Years of education: Not on file   Highest education level: Not on file  Occupational History   Not on file  Social Needs   Financial resource strain: Not on file   Food insecurity    Worry: Not on file    Inability: Not on file   Transportation needs    Medical: Not on file    Non-medical: Not on file  Tobacco Use   Smoking status: Never Smoker   Smokeless tobacco: Never Used  Substance and Sexual Activity   Alcohol use: Never    Frequency: Never   Drug use: Never   Sexual activity: Not on file  Lifestyle   Physical activity    Days per week: Not on file    Minutes per session: Not on file    Stress: Not on file  Relationships   Social connections    Talks on phone: Not on file    Gets together: Not on file    Attends religious service: Not on file    Active member of club or organization: Not on file    Attends meetings of clubs or organizations: Not on file    Relationship status: Not on file   Intimate partner violence    Fear of current or ex partner: Not on file    Emotionally abused: Not on file    Physically abused: Not on file    Forced sexual activity: Not on file  Other Topics Concern   Not on file  Social History Narrative   Not on file    Family History  Problem Relation Age of Onset   Stroke Mother    Hypertension Mother    Cancer Father    Heart attack Sister    Diabetes Sister    Heart attack Brother  Allergies  Allergen Reactions   Gabapentin Swelling   Lipitor [Atorvastatin] Other (See Comments)    Muscle aches   Mevacor [Lovastatin] Other (See Comments)    Muscle aches   Milk-Related Compounds Other (See Comments)    Large quantities cause headaches    Septra [Sulfamethoxazole-Trimethoprim] Other (See Comments)    Unknown   Statins Other (See Comments)    Muscle pain   Sulfur Diarrhea and Nausea And Vomiting   Zocor [Simvastatin] Other (See Comments)    Muscle aches     Review of Systems   Review of Systems: Negative Unless Checked Constitutional: [] Weight loss  [] Fever  [] Chills Cardiac: [] Chest pain   []  Atrial Fibrillation  [] Palpitations   [] Shortness of breath when laying flat   [] Shortness of breath with exertion. [] Shortness of breath at rest Vascular:  [] Pain in legs with walking   [] Pain in legs with standing [] Pain in legs when laying flat   [] Claudication    [] Pain in feet when laying flat    [] History of DVT   [] Phlebitis   [] Swelling in legs   [] Varicose veins   [] Non-healing ulcers Pulmonary:   [] Uses home oxygen   [] Productive cough   [] Hemoptysis   [] Wheeze  [] COPD   [] Asthma Neurologic:   [] Dizziness   [] Seizures  [] Blackouts [] History of stroke   [] History of TIA  [] Aphasia   [] Temporary Blindness   [] Weakness or numbness in arm   [] Weakness or numbness in leg Musculoskeletal:   [] Joint swelling   [] Joint pain   [] Low back pain  []  History of Knee Replacement [x] Arthritis [] back Surgeries  []  Spinal Stenosis    Hematologic:  [] Easy bruising  [] Easy bleeding   [] Hypercoagulable state   [x] Anemic Gastrointestinal:  [] Diarrhea   [] Vomiting  [] Gastroesophageal reflux/heartburn   [] Difficulty swallowing. [] Abdominal pain Genitourinary:  [] Chronic kidney disease   [] Difficult urination  [] Anuric   [] Blood in urine [] Frequent urination  [] Burning with urination   [] Hematuria Skin:  [] Rashes   [] Ulcers [] Wounds Psychological:  [] History of anxiety   []  History of major depression  []  Memory Difficulties      OBJECTIVE:   Physical Exam  BP (!) 175/54 (BP Location: Left Arm, Patient Position: Sitting, Cuff Size: Small)    Pulse 74    Resp 12    Ht 5\' 3"  (1.6 m)    Wt 117 lb (53.1 kg)    BMI 20.73 kg/m   Gen: WD/WN, NAD Head: [place]/AT, No temporalis wasting.  Ear/Nose/Throat: Hearing grossly intact, nares w/o erythema or drainage Eyes: PER, EOMI, sclera nonicteric.  Neck: Supple, no masses.  No JVD.  Pulmonary:  Good air movement, no use of accessory muscles.  Cardiac: RRR Vascular:  Vessel Right Left  Radial Palpable Palpable   Gastrointestinal: soft, non-distended. No guarding/no peritoneal signs. Feeding tube (G tube) Musculoskeletal: M/S 5/5 throughout.  No deformity or atrophy.  Neurologic: Pain and light touch intact in extremities.  Symmetrical.  Speech is fluent. Motor exam as listed above. Psychiatric: Judgment intact, Mood & affect appropriate for pt's clinical situation. Dermatologic: No Venous rashes. No Ulcers Noted.  No changes consistent with cellulitis. Lymph : No Cervical lymphadenopathy, no lichenification or skin changes of chronic lymphedema.        ASSESSMENT AND PLAN:  1. Mesenteric ischemia (HCC) Recommend:  The patient has evidence of chronic asymptomatic mesenteric atherosclerosis.  The patient denies lifestyle limiting changes at this point in time.  Given the lack of symptoms no intervention is warranted at  this time.   No further invasive studies, angiography or surgery at this time  The patient should continue walking and begin a more formal exercise program.   The patient should continue antiplatelet therapy and aggressive treatment of the lipid abnormalities  Patient should undergo noninvasive studies as ordered. The patient will follow up with me after the studies.    2. Hyperlipidemia, unspecified hyperlipidemia type Continue statin as ordered and reviewed, no changes at this time   3. Essential hypertension Continue antihypertensive medications as already ordered, these medications have been reviewed and there are no changes at this time.   Current Outpatient Medications on File Prior to Visit  Medication Sig Dispense Refill   acetaminophen (TYLENOL) 160 MG/5ML solution Place 20.3 mLs (650 mg total) into feeding tube every 6 (six) hours as needed for mild pain. 120 mL 0   allopurinol (ZYLOPRIM) 100 MG tablet Place 100 mg into feeding tube daily.     Amino Acids-Protein Hydrolys (FEEDING SUPPLEMENT, PRO-STAT SUGAR FREE 64,) LIQD Place 30 mLs into feeding tube daily. 450 mL 1   bacitracin ointment Apply to affected area twice a [name] 30 g 0   clopidogrel (PLAVIX) 75 MG tablet TAKE 1 TABLET BY MOUTH ONCE DAILY (Patient taking differently: Place 75 mg into feeding tube daily. ) 30 tablet 2   diltiazem (CARDIZEM) 60 MG tablet Take 60 mg by mouth 2 (two) times daily.      glipiZIDE (GLUCOTROL) 5 MG tablet Place 1 tablet (5 mg total) into feeding tube daily before breakfast.     hydrALAZINE (APRESOLINE) 50 MG tablet Place 1 tablet (50 mg total) into feeding tube 2 (two) times daily.     lisinopril  (PRINIVIL,ZESTRIL) 10 MG tablet Place 1 tablet (10 mg total) into feeding tube daily. 30 tablet 2   loperamide (IMODIUM) 1 MG/5ML solution Take 3 mg by mouth as needed for diarrhea or loose stools.     meclizine (ANTIVERT) 25 MG tablet Place 1 tablet into feeding tube as needed.      Multiple Vitamin (MULTIVITAMIN) LIQD Place 15 mLs into feeding tube daily. 450 mL 1   Nutritional Supplements (FEEDING SUPPLEMENT, JEVITY 1.5 CAL/FIBER,) LIQD Place 237 mLs into feeding tube 4 (four) times daily. 237 mL 90   pantoprazole sodium (PROTONIX) 40 mg/20 mL PACK Place 20 mLs (40 mg total) into feeding tube 2 (two) times daily. 30 each 1   rOPINIRole (REQUIP) 2 MG tablet Place 1 tablet (2 mg total) into feeding tube 2 (two) times daily. 60 tablet 1   Water For Irrigation, Sterile (FREE WATER) SOLN Place 30 mLs into feeding tube every 4 (four) hours.     hydrochlorothiazide (HYDRODIURIL) 25 MG tablet Place 25 mg into feeding tube daily.   1   Hypromellose (GENTEAL MILD) 0.2 % SOLN Place 1 drop into both eyes 3 (three) times daily.     Oral Electrolytes (PEDIALYTE PO) Give by tube daily.      simethicone (MYLICON) 40 [id number] drops Take 1.2 mLs (80 mg total) by mouth 4 (four) times daily as needed for flatulence. If unable to tolerate orally, may administer via PEG tube. 90 mL 0   vitamin C (VITAMIN C) 250 MG tablet Place 1 tablet (250 mg total) into feeding tube 2 (two) times daily. 60 tablet 1   No current facility-administered medications on file prior to visit.     There are no Patient Instructions on file for this visit. No follow-ups on file.   Lucretia Roers  [place] Shark, NP  This note was completed with Sales executive.  Any errors are purely unintentional.

## 2019-05-28 ENCOUNTER — Ambulatory Visit: Payer: Medicare Other

## 2019-05-29 ENCOUNTER — Inpatient Hospital Stay: Payer: Medicare Other

## 2019-05-29 ENCOUNTER — Other Ambulatory Visit: Payer: Self-pay

## 2019-05-29 ENCOUNTER — Ambulatory Visit: Payer: Medicare Other

## 2019-05-29 VITALS — BP 155/61 | HR 64 | Temp 96.8°F | Resp 17

## 2019-05-29 DIAGNOSIS — D509 Iron deficiency anemia, unspecified: Secondary | ICD-10-CM | POA: Diagnosis not present

## 2019-05-29 MED ORDER — IRON SUCROSE 20 MG/ML IV SOLN
200.0000 mg | Freq: Once | INTRAVENOUS | Status: AC
  Administered 2019-05-29: 200 mg via INTRAVENOUS
  Filled 2019-05-29: qty 10

## 2019-05-29 MED ORDER — SODIUM CHLORIDE 0.9 % IV SOLN
Freq: Once | INTRAVENOUS | Status: AC
  Administered 2019-05-29: 14:00:00 via INTRAVENOUS
  Filled 2019-05-29: qty 250

## 2019-05-29 NOTE — Progress Notes (Signed)
Tolerated venofer treatment well today. Reports epigastric pain is completely resolved. Ambulated with nurse and her cane at time of discharge to her daughters vehicle. No complaints at discharge.

## 2019-05-29 NOTE — Patient Instructions (Signed)

## 2019-06-16 ENCOUNTER — Telehealth: Payer: Self-pay

## 2019-06-16 NOTE — Telephone Encounter (Signed)
Nutrition  Received message that [employer] [phone]) patient's daughter had left message for RD to call back.   RD called, no answer.  Left message with call back number.   Selassie Spatafore B. Zenia Resides, [place], [place] Registered Dietitian [phone] (pager)

## 2019-06-17 ENCOUNTER — Telehealth: Payer: Self-pay

## 2019-06-17 NOTE — Telephone Encounter (Signed)
Nutrition  Message received from Daughter.    Called daughter back and [employer] unable to provide promod (liquid protein) at this time. Company has discontinued it.  Daughter received message that she should use whey protein instead.  RD has spoken with [employer] representative today and they are working towards getting another protein modular (prosource no carb).  Unsure timeline.    Recommend prostat-sugar free or  prosource no carb at this time until DME able to provide protein modular. Daughter verbalized understanding.   [name] B. Zenia Resides, [place], [place] Registered Dietitian [phone] (pager)

## 2019-06-22 ENCOUNTER — Telehealth: Payer: Self-pay

## 2019-06-22 NOTE — Telephone Encounter (Signed)
Nutrition  Left message on daughter's voicemail regarding updates from DME regarding protein modular.  Call back number given if daughter has further questions.   Jeral Zick B. Zenia Resides, [place], [place] Registered Dietitian [phone] (pager)

## 2019-06-24 ENCOUNTER — Other Ambulatory Visit: Payer: Self-pay

## 2019-06-25 ENCOUNTER — Other Ambulatory Visit: Payer: Self-pay

## 2019-06-25 ENCOUNTER — Other Ambulatory Visit: Payer: Medicare Other

## 2019-06-25 ENCOUNTER — Inpatient Hospital Stay: Payer: Medicare Other | Attending: Hematology and Oncology

## 2019-06-25 ENCOUNTER — Other Ambulatory Visit: Payer: Self-pay | Admitting: Hematology and Oncology

## 2019-06-25 DIAGNOSIS — D508 Other iron deficiency anemias: Secondary | ICD-10-CM | POA: Insufficient documentation

## 2019-06-25 LAB — BASIC METABOLIC PANEL
Anion gap: 8 (ref 5–15)
BUN: 37 mg/dL — ABNORMAL HIGH (ref 8–23)
CO2: 26 mmol/L (ref 22–32)
Calcium: 8.6 mg/dL — ABNORMAL LOW (ref 8.9–10.3)
Chloride: 101 mmol/L (ref 98–111)
Creatinine, Ser: 0.86 mg/dL (ref 0.44–1.00)
GFR calc Af Amer: >60 mL/min (ref >60)
GFR calc non Af Amer: >60 mL/min (ref >60)
Glucose, Bld: 122 mg/dL — ABNORMAL HIGH (ref 70–99)
Potassium: 4.2 mmol/L (ref 3.5–5.1)
Sodium: 135 mmol/L (ref 135–145)

## 2019-06-25 LAB — CBC WITH DIFFERENTIAL/PLATELET
Abs Immature Granulocytes: 0.01 10*3/uL (ref 0.00–0.07)
Basophils Absolute: 0.1 10*3/uL (ref 0.0–0.1)
Basophils Relative: 1 %
Eosinophils Absolute: 0.6 10*3/uL — ABNORMAL HIGH (ref 0.0–0.5)
Eosinophils Relative: 11 %
HCT: 27.4 % — ABNORMAL LOW (ref 36.0–46.0)
Hemoglobin: 8.8 g/dL — ABNORMAL LOW (ref 12.0–15.0)
Immature Granulocytes: 0 %
Lymphocytes Relative: 14 %
Lymphs Abs: 0.9 10*3/uL (ref 0.7–4.0)
MCH: 29 pg (ref 26.0–34.0)
MCHC: 32.1 g/dL (ref 30.0–36.0)
MCV: 90.4 fL (ref 80.0–100.0)
Monocytes Absolute: 0.4 10*3/uL (ref 0.1–1.0)
Monocytes Relative: 6 %
Neutro Abs: 4.2 10*3/uL (ref 1.7–7.7)
Neutrophils Relative %: 68 %
Platelets: 310 10*3/uL (ref 150–400)
RBC: 3.03 MIL/uL — ABNORMAL LOW (ref 3.87–5.11)
RDW: 14.5 % (ref 11.5–15.5)
WBC: 6.1 10*3/uL (ref 4.0–10.5)
nRBC: 0 % (ref 0.0–0.2)

## 2019-06-25 LAB — FERRITIN: Ferritin: 129 ng/mL (ref 11–307)

## 2019-06-25 LAB — IRON AND TIBC
Iron: 25 ug/dL — ABNORMAL LOW (ref 28–170)
Saturation Ratios: 9 % — ABNORMAL LOW (ref 10.4–31.8)
TIBC: 277 ug/dL (ref 250–450)
UIBC: 252 ug/dL

## 2019-06-26 ENCOUNTER — Other Ambulatory Visit: Payer: Self-pay | Admitting: Hematology and Oncology

## 2019-06-28 ENCOUNTER — Other Ambulatory Visit: Payer: Self-pay

## 2019-06-29 ENCOUNTER — Inpatient Hospital Stay: Payer: Medicare Other

## 2019-06-29 ENCOUNTER — Ambulatory Visit: Payer: Medicare Other

## 2019-06-29 ENCOUNTER — Encounter: Payer: Self-pay | Admitting: Hematology and Oncology

## 2019-06-29 ENCOUNTER — Inpatient Hospital Stay (HOSPITAL_BASED_OUTPATIENT_CLINIC_OR_DEPARTMENT_OTHER): Payer: Medicare Other | Admitting: Hematology and Oncology

## 2019-06-29 ENCOUNTER — Other Ambulatory Visit: Payer: Self-pay | Admitting: Hematology and Oncology

## 2019-06-29 VITALS — BP 155/57 | HR 72 | Temp 98.2°F | Resp 18 | Wt 117.6 lb

## 2019-06-29 DIAGNOSIS — D508 Other iron deficiency anemias: Secondary | ICD-10-CM

## 2019-06-29 DIAGNOSIS — D649 Anemia, unspecified: Secondary | ICD-10-CM

## 2019-06-29 DIAGNOSIS — L03119 Cellulitis of unspecified part of limb: Secondary | ICD-10-CM | POA: Diagnosis not present

## 2019-06-29 LAB — FERRITIN: Ferritin: 123 ng/mL (ref 11–307)

## 2019-06-29 LAB — CBC
HCT: 29.4 % — ABNORMAL LOW (ref 36.0–46.0)
Hemoglobin: 9.7 g/dL — ABNORMAL LOW (ref 12.0–15.0)
MCH: 29.1 pg (ref 26.0–34.0)
MCHC: 33 g/dL (ref 30.0–36.0)
MCV: 88.3 fL (ref 80.0–100.0)
Platelets: 322 10*3/uL (ref 150–400)
RBC: 3.33 MIL/uL — ABNORMAL LOW (ref 3.87–5.11)
RDW: 14.4 % (ref 11.5–15.5)
WBC: 7 10*3/uL (ref 4.0–10.5)
nRBC: 0 % (ref 0.0–0.2)

## 2019-06-29 LAB — C-REACTIVE PROTEIN: CRP: <0.8 mg/dL (ref <1.0)

## 2019-06-29 LAB — RETICULOCYTES
Immature Retic Fract: 11.8 % (ref 2.3–15.9)
RBC.: 3.31 MIL/uL — ABNORMAL LOW (ref 3.87–5.11)
Retic Count, Absolute: 48 10*3/uL (ref 19.0–186.0)
Retic Ct Pct: 1.5 % (ref 0.4–3.1)

## 2019-06-29 LAB — SEDIMENTATION RATE: Sed Rate: 45 mm/hr — ABNORMAL HIGH (ref 0–30)

## 2019-06-29 LAB — DAT, POLYSPECIFIC AHG (ARMC ONLY): Polyspecific AHG test: NEGATIVE

## 2019-06-29 LAB — LACTATE DEHYDROGENASE: LDH: 151 U/L (ref 98–192)

## 2019-06-29 MED ORDER — INFLUENZA VAC A&B SA ADJ QUAD 0.5 ML IM PRSY: 0.5000 mL | PREFILLED_SYRINGE | Freq: Once | INTRAMUSCULAR | Status: DC

## 2019-06-29 MED ORDER — INFLUENZA VAC A&B SA ADJ QUAD 0.5 ML IM PRSY
PREFILLED_SYRINGE | INTRAMUSCULAR | Status: AC
  Filled 2019-06-29: qty 0.5

## 2019-06-29 MED ORDER — CEPHALEXIN 500 MG PO CAPS: 500.0000 mg | ORAL_CAPSULE | Freq: Two times a day (BID) | ORAL | 0 refills | Status: AC

## 2019-06-29 NOTE — Progress Notes (Signed)
[facility]  [address], [address] [place], [place] [address] Phone: [phone]  Fax: [phone]   Clinic Day:  06/29/2019  Referring physician: [name], MD  Chief Complaint: [name] Day is a 85 y.o. female with iron deficiency anemia who is seen for 6 week assessment.  HPI: The patient was last seen in the hematology clinic on 05/13/2019. At that time, she felt pretty good.  She denied any bleeding.  Exam was unremarkable. Hematocrit was 28.7, hemoglobin 9.3, and MCV.  Ferritin was 73 with an iron saturation of 9% and a TIBC 319. B12 was 706 and folate 25.0.  She received Venofer weekly x 3 (05/13/2019 - 05/29/2019).  She was seen in the [facility] ER on 05/21/2019 with chest and abdomen pain.  She described epigastric pain radiating up her esphagus x 2 days.  EKG and troponins were negative. Carafate was added to her medications.  Labs on 06/25/2019 revealed a hematocrit 27.4, hemoglobin 8.8, and MCV 90.4.  Creatinine was 0.86.  Ferritin was 129 with an iron saturation of 9% and a TIBC 252.  During the interim, fairly well. Is no longer having abdominal pain, but is still taking medicine prescribed to her with no weight loss. G-tube still leaks, there is leaking around tube when her stomach is bloated. She will eat smaller portions throughout the day.  She likes being outside and has enough energy to do house chores. She walks at night to alleviate fluid retention in legs   Past Medical History:  Diagnosis Date   Anemia    IRON INFUSIONS   Aortic valve sclerosis    Arrhythmia    Arthritis    osteoarthritis   Basal cell carcinoma of skin    Brain tumor ([place])    Brain tumor ([place])    Cervical spine disease    Diabetes mellitus without complication (HCC)    Dysrhythmia    sinus arrhythmia   Esophageal stricture    severe, led to feeding tube placement in Sept 2019   Esophageal ulcer without bleeding    Gastrostomy tube  dependent (HCC)    DOES NOT EAT OR DRINK    GERD (gastroesophageal reflux disease)    History of kidney stones    Hyperlipemia    Hypertension    Kidney stones    Leaky heart valve    Meningioma (HCC)    Occlusive mesenteric ischemia (HCC)    Pulmonary hypertension (HCC)    RLS (restless legs syndrome)    Vertigo     Past Surgical History:  Procedure Laterality Date   ABDOMINAL HYSTERECTOMY     APPENDECTOMY     ARTHROGRAM KNEE Left    BACK SURGERY     CERVIVAL NECK FUSION   CATARACT EXTRACTION W/PHACO Left 11/11/2018   Procedure: CATARACT EXTRACTION PHACO AND INTRAOCULAR LENS PLACEMENT ([place]) LEFT, DIABETIC;  Surgeon: Birder Robson, MD;  Location: [facility] ORS;  Service: Ophthalmology;  Laterality: Left;  [place]  00:41 CDE 6.41 Fluid pack lot # [id number]   COLONOSCOPY WITH PROPOFOL N/A 06/20/2017   Procedure: COLONOSCOPY WITH PROPOFOL;  Surgeon: Lollie Sails, MD;  Location: [facility] ENDOSCOPY;  Service: Endoscopy;  Laterality: N/A;   ESOPHAGOGASTRODUODENOSCOPY (EGD) WITH PROPOFOL N/A 03/14/2015   Procedure: ESOPHAGOGASTRODUODENOSCOPY (EGD) WITH PROPOFOL;  Surgeon: Josefine Class, MD;  Location: [facility] ENDOSCOPY;  Service: Endoscopy;  Laterality: N/A;   ESOPHAGOGASTRODUODENOSCOPY (EGD) WITH PROPOFOL N/A 03/28/2017   Procedure: ESOPHAGOGASTRODUODENOSCOPY (EGD) WITH PROPOFOL;  Surgeon: Lollie Sails, MD;  Location: [facility] ENDOSCOPY;  Service: Endoscopy;  Laterality: N/A;   ESOPHAGOGASTRODUODENOSCOPY (EGD) WITH PROPOFOL N/A 06/20/2017   Procedure: ESOPHAGOGASTRODUODENOSCOPY (EGD) WITH PROPOFOL;  Surgeon: Lollie Sails, MD;  Location: [facility] ENDOSCOPY;  Service: Endoscopy;  Laterality: N/A;   ESOPHAGOGASTRODUODENOSCOPY (EGD) WITH PROPOFOL N/A 10/21/2017   Procedure: ESOPHAGOGASTRODUODENOSCOPY (EGD) WITH PROPOFOL;  Surgeon: Lollie Sails, MD;  Location: [facility] ENDOSCOPY;  Service: Endoscopy;  Laterality: N/A;   ESOPHAGOGASTRODUODENOSCOPY (EGD) WITH PROPOFOL N/A  04/15/2018   Procedure: ESOPHAGOGASTRODUODENOSCOPY (EGD) WITH PROPOFOL;  Surgeon: Lollie Sails, MD;  Location: [facility] ENDOSCOPY;  Service: Endoscopy;  Laterality: N/A;   ESOPHAGUS SURGERY     CLOSURE   EYE SURGERY     HYSTERECTOMY ABDOMINAL WITH SALPINGECTOMY     IR GASTROSTOMY TUBE MOD SED  06/11/2018   IR GASTROSTOMY TUBE REMOVAL  03/25/2019   PERIPHERAL VASCULAR CATHETERIZATION N/A 01/23/2016   Procedure: Visceral Venography;  Surgeon: Algernon Huxley, MD;  Location: [place] CV LAB;  Service: Cardiovascular;  Laterality: N/A;   PERIPHERAL VASCULAR CATHETERIZATION  01/23/2016   Procedure: Peripheral Vascular Intervention;  Surgeon: Algernon Huxley, MD;  Location: [place] [facility] LAB;  Service: Cardiovascular;;   UPPER ESOPHAGEAL ENDOSCOPIC ULTRASOUND (EUS) N/A 12/05/2017   Procedure: UPPER ESOPHAGEAL ENDOSCOPIC ULTRASOUND (EUS);  Surgeon: Jola Schmidt, MD;  Location: [facility] ENDOSCOPY;  Service: Endoscopy;  Laterality: N/A;   VISCERAL ANGIOGRAPHY N/A 03/18/2017   Procedure: Visceral Angiography;  Surgeon: Algernon Huxley, MD;  Location: [place] CV LAB;  Service: Cardiovascular;  Laterality: N/A;   VISCERAL ARTERY INTERVENTION N/A 03/18/2017   Procedure: Visceral Artery Intervention;  Surgeon: Algernon Huxley, MD;  Location: [place] [facility];  Service: Cardiovascular;  Laterality: N/A;    Family History  Problem Relation Age of Onset   Stroke Mother    Hypertension Mother    Cancer Father    Heart attack Sister    Diabetes Sister    Heart attack Brother     Social History:  reports that she has never smoked. She has never used smokeless tobacco. She reports that she does not drink alcohol or use drugs. She is retired. She worked at [employer]. She lives in [place].Patient's contact number is [phone]. Daughter's number is [phone] [name]).  The patient is alone today.  Allergies:  Allergies  Allergen Reactions   Gabapentin Swelling   Lipitor  [Atorvastatin] Other (See Comments)    Muscle aches   Mevacor [Lovastatin] Other (See Comments)    Muscle aches   Milk-Related Compounds Other (See Comments)    Large quantities cause headaches    Septra [Sulfamethoxazole-Trimethoprim] Other (See Comments)    Unknown   Statins Other (See Comments)    Muscle pain   Sulfur Diarrhea and Nausea And Vomiting   Zocor [Simvastatin] Other (See Comments)    Muscle aches    Current Medications: Current Outpatient Medications  Medication Sig Dispense Refill   acetaminophen (TYLENOL) 160 MG/5ML solution Place 20.3 mLs (650 mg total) into feeding tube every 6 (six) hours as needed for mild pain. 120 mL 0   allopurinol (ZYLOPRIM) 100 MG tablet Place 100 mg into feeding tube daily.     Amino Acids-Protein Hydrolys (FEEDING SUPPLEMENT, PRO-STAT SUGAR FREE 64,) LIQD Place 30 mLs into feeding tube daily. 450 mL 1   bacitracin ointment Apply to affected area twice a day 30 g 0   clopidogrel (PLAVIX) 75 MG tablet TAKE 1 TABLET BY MOUTH ONCE DAILY (Patient taking differently: Place 30  mg into feeding tube daily. ) 30 tablet 2   diltiazem (CARDIZEM) 60 MG tablet Take 60 mg by mouth 2 (two) times daily.      glipiZIDE (GLUCOTROL) 5 MG tablet Place 1 tablet (5 mg total) into feeding tube daily before breakfast.     hydrALAZINE (APRESOLINE) 50 MG tablet Place 1 tablet (50 mg total) into feeding tube 2 (two) times daily.     hydrochlorothiazide (HYDRODIURIL) 25 MG tablet Place 25 mg into feeding tube daily.   1   Hypromellose (GENTEAL MILD) 0.2 % SOLN Place 1 drop into both eyes 3 (three) times daily.     lisinopril (PRINIVIL,ZESTRIL) 10 MG tablet Place 1 tablet (10 mg total) into feeding tube daily. 30 tablet 2   loperamide (IMODIUM) 1 MG/5ML solution Take 3 mg by mouth as needed for diarrhea or loose stools.     meclizine (ANTIVERT) 25 MG tablet Place 1 tablet into feeding tube as needed.      Multiple Vitamin (MULTIVITAMIN) LIQD Place 15  mLs into feeding tube daily. 450 mL 1   Nutritional Supplements (FEEDING SUPPLEMENT, JEVITY 1.5 CAL/FIBER,) LIQD Place 237 mLs into feeding tube 4 (four) times daily. 237 mL 90   pantoprazole sodium (PROTONIX) 40 mg/20 mL PACK Place 20 mLs (40 mg total) into feeding tube 2 (two) times daily. 30 each 1   rOPINIRole (REQUIP) 2 MG tablet Place 1 tablet (2 mg total) into feeding tube 2 (two) times daily. 60 tablet 1   vitamin C (VITAMIN C) 250 MG tablet Place 1 tablet (250 mg total) into feeding tube 2 (two) times daily. 60 tablet 1   Water For Irrigation, Sterile (FREE WATER) SOLN Place 30 mLs into feeding tube every 4 (four) hours.     Current Facility-Administered Medications  Medication Dose Route Frequency Provider Last Rate Last Dose   influenza vaccine adjuvanted (FLUAD) injection 0.5 mL  0.5 mL Intramuscular Once Lequita Asal, MD        Review of Systems  Constitutional: Negative.  Negative for chills, diaphoresis, fever, malaise/fatigue and weight loss (up 2 lbs).       Feels "fairly well".  HENT: Negative.  Negative for congestion, ear pain, hearing loss, nosebleeds, sinus pain and sore throat.   Eyes: Positive for blurred vision (vision issues, chronic). Negative for double vision.  Respiratory: Negative.  Negative for cough, sputum production, shortness of breath and wheezing.   Cardiovascular: Negative.  Negative for chest pain, palpitations, leg swelling and PND.  Gastrointestinal: Negative for abdominal pain, blood in stool, constipation, diarrhea (loose stools), heartburn, melena, nausea and vomiting.       Dysphagia.  Drainage around G-tube.  Genitourinary: Negative.  Negative for dysuria, frequency, hematuria and urgency.  Musculoskeletal: Positive for joint pain (arthritis, chronic pain). Negative for back pain and myalgias.  Skin: Negative for rash.       Dry skin.  Neurological: Positive for weakness (generalized). Negative for dizziness, tingling, sensory  change, focal weakness and headaches.  Endo/Heme/Allergies: Does not bruise/bleed easily.       Thyroid disease on Synthroid.   Psychiatric/Behavioral: Negative.  Negative for depression and memory loss. The patient is not nervous/anxious and does not have insomnia.   All other systems reviewed and are negative.  Performance status (ECOG):  2  Vitals Blood pressure (!) 155/57, pulse 72, temperature 98.2 F (36.8 C), temperature source Oral, resp. rate 18, weight 117 lb 9.9 oz (53.4 kg), SpO2 100 %.   Physical Exam  Constitutional:  She is oriented to person, place, and time. She appears well-developed and well-nourished. No distress.  HENT:  Head: Normocephalic and atraumatic.  Right Ear: External ear normal.  Mouth/Throat: Oropharynx is clear and moist.  Short gray hair.  Eyes: Pupils are equal, round, and reactive to light. Conjunctivae and EOM are normal. No scleral icterus.  Glasses.  Neck: Normal range of motion. Neck supple. No JVD present.  Cardiovascular: Normal rate, regular rhythm, normal heart sounds and intact distal pulses. Exam reveals no gallop and no friction rub.  No murmur heard. Pulmonary/Chest: Effort normal and breath sounds normal. She has no wheezes. She has no rales.  Abdominal: Soft. Bowel sounds are normal. She exhibits no distension and no mass. There is no abdominal tenderness. There is no rebound and no guarding.  Musculoskeletal:        General: Edema (3+ left; 2+ right; slight pinkness without increased warmth or tenderness) present. No tenderness.     Right ankle: No tenderness. Achilles tendon exhibits no pain.     Left ankle: No tenderness. Achilles tendon exhibits no pain.  Lymphadenopathy:    She has no cervical adenopathy.  Neurological: She is alert and oriented to person, place, and time.  Skin: Skin is warm and dry. She is not diaphoretic.  Psychiatric: She has a normal mood and affect. Her behavior is normal. Judgment and thought content  normal.  Nursing note and vitals reviewed.       Lower extremities      Appointment on 06/29/2019  Component Date Value Ref Range Status   LDH 06/29/2019 151  98 - 192 U/L Final   Performed at [facility], [address] [place], [place] [address]   Sed Rate 06/29/2019 45* 0 - 30 mm/hr Final   Performed at [facility], [address] [place], [place] [address]   WBC 06/29/2019 7.0  4.0 - 10.5 K/uL Final   RBC 06/29/2019 3.33* 3.87 - 5.11 MIL/uL Final   Hemoglobin 06/29/2019 9.7* 12.0 - 15.0 g/dL Final   HCT 06/29/2019 29.4* 36.0 - 46.0 % Final   MCV 06/29/2019 88.3  80.0 - 100.0 fL Final   MCH 06/29/2019 29.1  26.0 - 34.0 pg Final   MCHC 06/29/2019 33.0  30.0 - 36.0 g/dL Final   RDW 06/29/2019 14.4  11.5 - 15.5 % Final   Platelets 06/29/2019 322  150 - 400 K/uL Final   nRBC 06/29/2019 0.0  0.0 - 0.2 % Final   Performed at [facility], [address] [place], [place] [address]    Assessment:  BLIMIE BACHAND Day is a 85 y.o. female withiron deficiency anemia. She has been on oral iron x 1 year with continued decline in her counts. She has dysphagia and odynophagia. She is on PPI and carafate. Dietis modest. She denies pica.  Abdomen and pelvic CTon 02/19/2017 revealed no acute abnormality. She has mild sigmoid diverticulosis.  CBCon 02/26/2017 revealed a hematocrit of 25.3, hemoglobin 8.0, and MCV 83.2. Hemoccult studiesx 1 were positive in 11/19/2016 and negative x 2 in 01/2017. Ferritin was 5 with an iron saturation of 4%, and a TIBC of 487 on 01/30/2017. Normal studies included: creatinine (1.0), calcium, albumen, B12 (647), and TSH. Folic acid was [id number] on 08/09/2015. Urinalysis revealed no blood on 01/23/2017 and 10/28/2018. SPEP was negative on 02/28/2017.  She received Venoferweekly x 3 (02/28/2017 - 03/14/2017), x 2 (04/19/2017 - 04/26/2017), x 2 (06/14/2017-06/21/2017),11/21/2017, x 3 (01/09/2018  - 01/23/2018),  02/20/2018, x 2 (08/22/2018 - 09/01/2018), 11/26/2018, x3 (02/18/2019 - 03/04/2019), and x 3 (05/13/2019 - 05/29/2019).   Ferritin has been followed: 8 on 02/28/2017, 164 on 03/19/2017, 27 on 04/18/2017, 82 on 05/17/2017, 38 on 06/10/2017, 100 on 07/18/2017, 40 on 09/18/2017, 9 on 11/19/2017, 13 on 01/02/2018, 57 on 02/19/2018, 72 on 04/04/2018, 41 on 05/21/2018, 36 on 08/18/2018,80 on 09/22/2018, 45 on 11/25/2018,96 on 01/07/2019,48 on 02/17/2019, 104 on 04/02/2019, 73 on 05/12/2019, 123 on 06/29/2019.   EGDin 2016 revealed gastric ulcers and esophageal stricture which was dilated. Barium swallowon 11/15/2015 was normal. She has a history of mesenteric ischemia. 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 She was on Coumadin, and now on Plavix. Colonoscopyin 2008 revealed diverticulosis.   EGDon 03/28/2017 revealed a moderate-sized area of extrinsic compression was found in the mid esophagus from about 25-28 cm, no apparent defect or abnormality in the mucosa or esophageal wall. There was a 2 cm cratered esophageal ulcer. There was segmental moderate mucosal changes characterized by granularity at the gastroesophageal junction. There was localized mild inflammation characterized by congestion (edema), depression and erythema was found on the greater curvature of the gastric body. Biopsies revealed no H pylori, dysplasia or malignancy. She is on pantoprazole and Carafate. She has esophageal spasmsimproved with Bentyl.  EGDon 06/20/2017 revealed a non-bleeding esophageal ulcer, gastritis, and a normal duodenum. Pathology revealed mild reactive gastropathy. Colonoscopyon 06/20/2017 revealed non-bleeding external hemorrhoids, diverticulosis in the sigmoid colon, and one 1 mm polyp at the recto-sigmoid colon. Pathology revealed a tubular adenoma Negative for high grade dysplasia or malignancy.  Upper EUSon 12/05/2017 revealed esophageal stenosis. Biopsy revealed  CMV viral cytopathic effect.There was no evidence of malignancy. The EUS scope could not be advanced into the stenosis as it was too tight. There was loss of normal wall layers and diffusely hypoechoic measuring from 3 mm to 6 mm in thickness.   She was treated with valacyclovir x 28 days. CMV DNA quantitative by PCR was positive on 01/02/2018 and <200 IU/ml on 01/30/2018.  Upper GI endoscopyon 04/15/2018 revealed extrinsic compression in the mid esophagus. Esophageal biopsy revealed detached stratified squamous epithelium with focal reactive parakeratosis. There was no dysplasia or malignancy. There was a benign appearing esophageal stenosis. She underwentEGD with esophageal dilatationon 05/01/2018 and 05/19/2018 at [facility].  Chest CT on 05/28/2018 revealed distal esophageal stricture at the GE junction with associated mild submucosal edema, possibly reflecting a post infectious/inflammatory stricture, underlying neoplasm not entirely excluded. There was no extrinsic compression.  She underwent PEG tube placementon 06/11/2018 and exchange on 03/25/2019. She takes little by mouth.  Symptomatically, she feels "ok".  She has bilateral lower extremity edema (left > right) with slight pinkness, but no increased warmth.  Plan: 1.   Review labs from 06/25/2019. 2. Iron deficiency anemia  Hematocrit 29.3.  Hemoglobin is 9.7. MCV 88.3. Ferritin 123 today. Continue to monitor. 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 Patient's sole nutrition is through her G-tube.  She notes intermittent G-tube leakage.   Discuss need for follow-up testing. 4.   Lower extremity edema with mild erythema  Dr Ginette Pitman on phone- done.  Rx:  Keflex 500 mg BID x 10 days.  Follow-up with Dr. Ginette Pitman. 5.   RN to call patient with today's labs re: if additional Venofer needed. 6.   RTC in 1 month  for labs (CBC with  diff). 7.   RTC in 2 months for MD assessment, labs (CBC with diff, ferritin, iron stores, sed rate), and +/- Venofer.  I discussed the assessment and treatment plan with the patient.  The patient was provided an opportunity to ask questions and all were answered.  The patient agreed with the plan and demonstrated an understanding of the instructions.  The patient was advised to call back if the symptoms worsen or if the condition fails to improve as anticipated.  I provided 14 minutes (2:23 PM - 2:37 PM) of face-to-face time during this this encounter and > 50% was spent counseling as documented under my assessment and plan.    Lequita Asal, MD, PhD    06/29/2019, 2:33 PM  I, Samul Dada, am acting as a scribe for Lequita Asal, MD.  I, Sierra Mike Gip, MD, have reviewed the above documentation for accuracy and completeness, and I agree with the above.

## 2019-06-29 NOTE — Progress Notes (Signed)
Patient here for follow up. Reports BLE edema x 1 week. LLE 3+ pitting edema with some erythema present and slight calf tenderness. RLE 2+ pitting edema and slight calf tenderness. Both have been present x 1 week.

## 2019-06-30 LAB — HAPTOGLOBIN: Haptoglobin: 127 mg/dL (ref 41–333)

## 2019-07-27 ENCOUNTER — Other Ambulatory Visit: Payer: Self-pay

## 2019-07-27 ENCOUNTER — Inpatient Hospital Stay: Payer: Medicare Other | Attending: Hematology and Oncology

## 2019-07-27 DIAGNOSIS — D509 Iron deficiency anemia, unspecified: Secondary | ICD-10-CM | POA: Insufficient documentation

## 2019-07-27 DIAGNOSIS — D508 Other iron deficiency anemias: Secondary | ICD-10-CM

## 2019-07-27 LAB — CBC WITH DIFFERENTIAL/PLATELET
Abs Immature Granulocytes: 0.03 10*3/uL (ref 0.00–0.07)
Basophils Absolute: 0 10*3/uL (ref 0.0–0.1)
Basophils Relative: 1 %
Eosinophils Absolute: 0.6 10*3/uL — ABNORMAL HIGH (ref 0.0–0.5)
Eosinophils Relative: 8 %
HCT: 28.4 % — ABNORMAL LOW (ref 36.0–46.0)
Hemoglobin: 9.3 g/dL — ABNORMAL LOW (ref 12.0–15.0)
Immature Granulocytes: 0 %
Lymphocytes Relative: 11 %
Lymphs Abs: 0.8 10*3/uL (ref 0.7–4.0)
MCH: 28.7 pg (ref 26.0–34.0)
MCHC: 32.7 g/dL (ref 30.0–36.0)
MCV: 87.7 fL (ref 80.0–100.0)
Monocytes Absolute: 0.5 10*3/uL (ref 0.1–1.0)
Monocytes Relative: 6 %
Neutro Abs: 5.4 10*3/uL (ref 1.7–7.7)
Neutrophils Relative %: 74 %
Platelets: 271 10*3/uL (ref 150–400)
RBC: 3.24 MIL/uL — ABNORMAL LOW (ref 3.87–5.11)
RDW: 14.5 % (ref 11.5–15.5)
WBC: 7.3 10*3/uL (ref 4.0–10.5)
nRBC: 0 % (ref 0.0–0.2)

## 2019-07-31 ENCOUNTER — Ambulatory Visit
Admission: RE | Admit: 2019-07-31 | Discharge: 2019-07-31 | Disposition: A | Discharge status: Home / Self Care | Payer: Medicare Other | Source: Ambulatory Visit | Attending: Internal Medicine | Admitting: Internal Medicine

## 2019-07-31 ENCOUNTER — Other Ambulatory Visit: Payer: Self-pay | Admitting: Internal Medicine

## 2019-07-31 ENCOUNTER — Other Ambulatory Visit: Payer: Self-pay

## 2019-07-31 DIAGNOSIS — R600 Localized edema: Secondary | ICD-10-CM | POA: Insufficient documentation

## 2019-07-31 DIAGNOSIS — G2581 Restless legs syndrome: Secondary | ICD-10-CM | POA: Insufficient documentation

## 2019-07-31 DIAGNOSIS — Z931 Gastrostomy status: Secondary | ICD-10-CM | POA: Insufficient documentation

## 2019-08-03 ENCOUNTER — Encounter (INDEPENDENT_AMBULATORY_CARE_PROVIDER_SITE_OTHER): Payer: Medicare Other | Admitting: Nurse Practitioner

## 2019-08-17 ENCOUNTER — Ambulatory Visit: Payer: Medicare Other | Admitting: Hematology and Oncology

## 2019-08-17 ENCOUNTER — Other Ambulatory Visit: Payer: Medicare Other

## 2019-08-17 ENCOUNTER — Ambulatory Visit: Payer: Medicare Other

## 2019-08-18 NOTE — Progress Notes (Signed)
[facility]  [address] [place], [place] [address] Phone: [phone]  Fax: [phone]   Clinic Day:  08/20/2019  Referring physician: [name], MD  Chief Complaint: BREECE BROWE Day is a 85 y.o. female with iron deficiency anemia who is seen for 2 month assessment.  HPI: The patient was last seen in the hematology clinic on 06/29/2019.  At that time, she felt "ok".  She had bilateral lower extremity edema (left > right) with slight pinkness, but no increased warmth. Hematocrit 29.3. Hemoglobin 9.7. MCV 88.3.  Ferritin was 123.  Labs on 07/27/2019: Hematocrit 28.4, hemoglobin 9.3, MCV 87.7, platelets 271,000, WBC 7,300 and ANC 5400. Absolute eosinophil count was 600 (0-500).   Patient was seen by Dr. Ginette Pitman on 07/31/2019. Patient had some bilateral leg edema. Bilateral lower venous duplex on 07/31/2019 revealed no evidence of DVT in either lower extremity.   During the interim, she has done well. She is eating better.  She describes eating boiled eggs with ranch dressing, some toast with butter, and broccoli soup.  She drinks 1 Glucerna a day.  She notes feeling bloated. She denies bleeding of any kind. She denies dysphagia. She has mild drainage around G-tube. She has joint pain secondary to arthritis. She has left knee swelling. She notes bruising easily. I recommenced she wear compression stockings during the day and take them off at night. She continues to have blurred vision.    Past Medical History:  Diagnosis Date  . Anemia    IRON INFUSIONS  . Aortic valve sclerosis   . Arrhythmia   . Arthritis    osteoarthritis  . Basal cell carcinoma of skin   . Brain tumor ([place])   . Brain tumor ([place])   . Cervical spine disease   . Diabetes mellitus without complication ([place])   . Dysrhythmia    sinus arrhythmia  . Esophageal stricture    severe, led to feeding tube placement in Sept 2019  . Esophageal ulcer without bleeding   .  Gastrostomy tube dependent ([place])    DOES NOT EAT OR DRINK   . GERD (gastroesophageal reflux disease)   . History of kidney stones   . Hyperlipemia   . Hypertension   . Kidney stones   . Leaky heart valve   . Meningioma ([place])   . 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 Pulmonary hypertension ([place])   . RLS (restless legs syndrome)   . Vertigo     Past Surgical History:  Procedure Laterality Date  . ABDOMINAL HYSTERECTOMY    . APPENDECTOMY    . ARTHROGRAM KNEE Left   . BACK SURGERY     CERVIVAL NECK FUSION  . CATARACT EXTRACTION W/PHACO Left 11/11/2018   Procedure: CATARACT EXTRACTION PHACO AND INTRAOCULAR LENS PLACEMENT (IOC) LEFT, DIABETIC;  Surgeon: Birder Robson, MD;  Location: [facility] ORS;  Service: Ophthalmology;  Laterality: Left;  [place]  00:41 CDE 6.41 Fluid pack lot # [id number]  . COLONOSCOPY WITH PROPOFOL N/A 06/20/2017   Procedure: COLONOSCOPY WITH PROPOFOL;  Surgeon: Lollie Sails, MD;  Location: [facility] ENDOSCOPY;  Service: Endoscopy;  Laterality: N/A;  . ESOPHAGOGASTRODUODENOSCOPY (EGD) WITH PROPOFOL N/A 03/14/2015   Procedure: ESOPHAGOGASTRODUODENOSCOPY (EGD) WITH PROPOFOL;  Surgeon: Josefine Class, MD;  Location: [facility] ENDOSCOPY;  Service: Endoscopy;  Laterality: N/A;  . ESOPHAGOGASTRODUODENOSCOPY (EGD) WITH PROPOFOL N/A 03/28/2017   Procedure: ESOPHAGOGASTRODUODENOSCOPY (EGD) WITH PROPOFOL;  Surgeon: Lollie Sails, MD;  Location: [facility] ENDOSCOPY;  Service: Endoscopy;  Laterality: N/A;  .  ESOPHAGOGASTRODUODENOSCOPY (EGD) WITH PROPOFOL N/A 06/20/2017   Procedure: ESOPHAGOGASTRODUODENOSCOPY (EGD) WITH PROPOFOL;  Surgeon: Lollie Sails, MD;  Location: [facility] ENDOSCOPY;  Service: Endoscopy;  Laterality: N/A;  . ESOPHAGOGASTRODUODENOSCOPY (EGD) WITH PROPOFOL N/A 10/21/2017   Procedure: ESOPHAGOGASTRODUODENOSCOPY (EGD) WITH PROPOFOL;  Surgeon: Lollie Sails, MD;  Location: [facility] ENDOSCOPY;  Service: Endoscopy;  Laterality: N/A;  . ESOPHAGOGASTRODUODENOSCOPY (EGD)  WITH PROPOFOL N/A 04/15/2018   Procedure: ESOPHAGOGASTRODUODENOSCOPY (EGD) WITH PROPOFOL;  Surgeon: Lollie Sails, MD;  Location: [facility] ENDOSCOPY;  Service: Endoscopy;  Laterality: N/A;  . ESOPHAGUS SURGERY     CLOSURE  . EYE SURGERY    . HYSTERECTOMY ABDOMINAL WITH SALPINGECTOMY    . IR GASTROSTOMY TUBE MOD SED  06/11/2018  . IR GASTROSTOMY TUBE REMOVAL  03/25/2019  . PERIPHERAL VASCULAR CATHETERIZATION N/A 01/23/2016   Procedure: Visceral Venography;  Surgeon: Algernon Huxley, MD;  Location: [place] CV LAB;  Service: Cardiovascular;  Laterality: N/A;  . PERIPHERAL VASCULAR CATHETERIZATION  01/23/2016   Procedure: Peripheral Vascular Intervention;  Surgeon: Algernon Huxley, MD;  Location: [place] CV LAB;  Service: Cardiovascular;;  . UPPER ESOPHAGEAL ENDOSCOPIC ULTRASOUND (EUS) N/A 12/05/2017   Procedure: UPPER ESOPHAGEAL ENDOSCOPIC ULTRASOUND (EUS);  Surgeon: Jola Schmidt, MD;  Location: [facility] ENDOSCOPY;  Service: Endoscopy;  Laterality: N/A;  . VISCERAL ANGIOGRAPHY N/A 03/18/2017   Procedure: Visceral Angiography;  Surgeon: Algernon Huxley, MD;  Location: [place] CV LAB;  Service: Cardiovascular;  Laterality: N/A;  . VISCERAL ARTERY INTERVENTION N/A 03/18/2017   Procedure: Visceral Artery Intervention;  Surgeon: Algernon Huxley, MD;  Location: [facility] CV LAB;  Service: Cardiovascular;  Laterality: N/A;    Family History  Problem Relation Age of Onset  . Stroke Mother   . Hypertension Mother   . Cancer Father   . Heart attack Sister   . Diabetes Sister   . Heart attack Brother     Social History:  reports that she has never smoked. She has never used smokeless tobacco. She reports that she does not drink alcohol or use drugs. She is retired. She worked at [employer]. She lives in [place].Patient's contact number is [phone]. Daughter's number is [phone] [name]). The patient is alone today.  Allergies:  Allergies  Allergen Reactions  . Gabapentin  Swelling  . Lipitor [Atorvastatin] Other (See Comments)    Muscle aches  . Mevacor [Lovastatin] Other (See Comments)    Muscle aches  . Milk-Related Compounds Other (See Comments)    Large quantities cause headaches   . Septra [Sulfamethoxazole-Trimethoprim] Other (See Comments)    Unknown  . Statins Other (See Comments)    Muscle pain  . Sulfur Diarrhea and Nausea And Vomiting  . Zocor [Simvastatin] Other (See Comments)    Muscle aches    Current Medications: Current Outpatient Medications  Medication Sig Dispense Refill  . acetaminophen (TYLENOL) 160 MG/5ML solution Place 20.3 mLs (650 mg total) into feeding tube every 6 (six) hours as needed for mild pain. 120 mL 0  . allopurinol (ZYLOPRIM) 100 MG tablet Place 100 mg into feeding tube daily.    . Amino Acids-Protein Hydrolys (FEEDING SUPPLEMENT, PRO-STAT SUGAR FREE 64,) LIQD Place 30 mLs into feeding tube daily. 450 mL 1  . bacitracin ointment Apply to affected area twice a day 30 g 0  . clopidogrel (PLAVIX) 75 MG tablet TAKE 1 TABLET BY MOUTH ONCE DAILY (Patient taking differently: Place 75 mg into feeding tube daily. ) 30 tablet 2  . furosemide (  LASIX) 10 MG/ML solution 60 mg by Nasogastric route daily.     Marland Kitchen glipiZIDE (GLUCOTROL) 5 MG tablet Place 1 tablet (5 mg total) into feeding tube daily before breakfast.    . hydrALAZINE (APRESOLINE) 50 MG tablet Place 1 tablet (50 mg total) into feeding tube 2 (two) times daily.    . Hypromellose (GENTEAL MILD) 0.2 % SOLN Place 1 drop into both eyes 3 (three) times daily.    Marland Kitchen lisinopril (ZESTRIL) 40 MG tablet 20 mg daily.     Marland Kitchen loperamide (IMODIUM) 1 MG/5ML solution Take 3 mg by mouth as needed for diarrhea or loose stools.    . meclizine (ANTIVERT) 25 MG tablet Place 1 tablet into feeding tube as needed.     . Multiple Vitamin (MULTIVITAMIN) LIQD Place 15 mLs into feeding tube daily. 450 mL 1  . Nutritional Supplements (FEEDING SUPPLEMENT, JEVITY 1.5 CAL/FIBER,) LIQD Place 237 mLs into  feeding tube 4 (four) times daily. 237 mL 90  . pantoprazole sodium (PROTONIX) 40 mg/20 mL PACK Place 20 mLs (40 mg total) into feeding tube 2 (two) times daily. 30 each 1  . rOPINIRole (REQUIP) 2 MG tablet Place 1 tablet (2 mg total) into feeding tube 2 (two) times daily. 60 tablet 1  . vitamin C (VITAMIN C) 250 MG tablet Place 1 tablet (250 mg total) into feeding tube 2 (two) times daily. 60 tablet 1  . Water For Irrigation, Sterile (FREE WATER) SOLN Place 30 mLs into feeding tube every 4 (four) hours.    Marland Kitchen diltiazem (CARDIZEM) 120 MG tablet      No current facility-administered medications for this visit.     Review of Systems  Constitutional: Negative.  Negative for chills, diaphoresis, fever, malaise/fatigue and weight loss (up 1 pound).       Doing well.  HENT: Negative.  Negative for congestion, ear pain, hearing loss, nosebleeds, sinus pain and sore throat.   Eyes: Positive for blurred vision (vision issues, chronic). Negative for double vision.  Respiratory: Negative.  Negative for cough, sputum production, shortness of breath and wheezing.   Cardiovascular: Positive for leg swelling (left knee). Negative for palpitations and PND.  Gastrointestinal: Negative for abdominal pain, blood in stool, constipation, diarrhea, heartburn, melena, nausea and vomiting.       Eating better. Bloated.  Genitourinary: Negative.  Negative for dysuria, frequency, hematuria and urgency.  Musculoskeletal: Positive for joint pain (arthritis, chronic pain). Negative for back pain, myalgias and neck pain.  Skin: Negative.  Negative for rash.  Neurological: Positive for weakness (generalized). Negative for dizziness, tingling, sensory change, focal weakness, seizures and headaches.  Endo/Heme/Allergies: Bruises/bleeds easily.       Thyroid disease on Synthroid.   Psychiatric/Behavioral: Negative.  Negative for depression and memory loss. The patient is not nervous/anxious and does not have insomnia.   All  other systems reviewed and are negative.  Performance status (ECOG): 2  Vitals Blood pressure (!) 142/39, pulse 65, temperature 98.3 F (36.8 C), temperature source Oral, resp. rate 18, weight 118 lb 15 oz (53.9 kg), SpO2 100 %.   Physical Exam  Constitutional: She is oriented to person, place, and time. She appears well-developed and well-nourished. No distress.  Elderly woman sitting comfortably in the exam room in no acute distress.  She has a cane by her side.  HENT:  Head: Normocephalic and atraumatic.  Right Ear: External ear normal.  Mouth/Throat: Oropharynx is clear and moist.  Short gray hair.  Mask.  Eyes: Pupils are equal,  round, and reactive to light. Conjunctivae and EOM are normal. No scleral icterus.  Glasses.  Hazel eyes.  Neck: Normal range of motion. Neck supple. No JVD present.  Cardiovascular: Normal rate, regular rhythm and normal heart sounds. Exam reveals no gallop and no friction rub.  No murmur heard. Pulmonary/Chest: Effort normal and breath sounds normal. No respiratory distress. She has no wheezes. She has no rales.  Abdominal: Soft. Bowel sounds are normal. She exhibits no distension and no mass. There is no abdominal tenderness. There is no rebound and no guarding.  G tube.  Musculoskeletal: Normal range of motion.        General: Tenderness (right ankle) and edema (1+ right ankle) present.     Right ankle: No tenderness. Achilles tendon exhibits no pain.     Left ankle: No tenderness. Achilles tendon exhibits no pain.     Comments: Wearing compression stockings.  Lymphadenopathy:       Head (right side): No preauricular, no posterior auricular and no occipital adenopathy present.       Head (left side): No preauricular, no posterior auricular and no occipital adenopathy present.    She has no cervical adenopathy.    She has no axillary adenopathy.       Right: No inguinal adenopathy present.       Left: No inguinal and no supraclavicular adenopathy  present.  Neurological: She is alert and oriented to person, place, and time.  Skin: Skin is warm and dry. Ecchymosis (upper extremity) noted. No rash noted. She is not diaphoretic. No erythema. No pallor.  Psychiatric: She has a normal mood and affect. Her behavior is normal. Judgment and thought content normal.  Nursing note and vitals reviewed.   Appointment on 08/20/2019  Component Date Value Ref Range Status  . WBC 08/20/2019 7.3  4.0 - 10.5 K/uL Final  . RBC 08/20/2019 3.43* 3.87 - 5.11 MIL/uL Final  . Hemoglobin 08/20/2019 9.9* 12.0 - 15.0 g/dL Final  . HCT 08/20/2019 30.3* 36.0 - 46.0 % Final  . MCV 08/20/2019 88.3  80.0 - 100.0 fL Final  . MCH 08/20/2019 28.9  26.0 - 34.0 pg Final  . MCHC 08/20/2019 32.7  30.0 - 36.0 g/dL Final  . RDW 08/20/2019 14.8  11.5 - 15.5 % Final  . Platelets 08/20/2019 305  150 - 400 K/uL Final  . nRBC 08/20/2019 0.0  0.0 - 0.2 % Final  . Neutrophils Relative % 08/20/2019 62  % Final  . Neutro Abs 08/20/2019 4.6  1.7 - 7.7 K/uL Final  . Lymphocytes Relative 08/20/2019 20  % Final  . Lymphs Abs 08/20/2019 1.4  0.7 - 4.0 K/uL Final  . Monocytes Relative 08/20/2019 7  % Final  . Monocytes Absolute 08/20/2019 0.5  0.1 - 1.0 K/uL Final  . Eosinophils Relative 08/20/2019 10  % Final  . Eosinophils Absolute 08/20/2019 0.7* 0.0 - 0.5 K/uL Final  . Basophils Relative 08/20/2019 1  % Final  . Basophils Absolute 08/20/2019 0.1  0.0 - 0.1 K/uL Final  . Immature Granulocytes 08/20/2019 0  % Final  . Abs Immature Granulocytes 08/20/2019 0.03  0.00 - 0.07 K/uL Final   Performed at [facility], [address] [place], [place] [address]    Assessment:  VERLEE FAVARA Day is a 85 y.o. female withiron deficiency anemia. She has been on oral iron x 1 year with continued decline in her counts. She has dysphagia and odynophagia. She is on PPI and carafate. Dietis  modest. She denies pica.  Abdomen and pelvic CTon 02/19/2017 revealed no acute  abnormality. She has mild sigmoid diverticulosis.  CBCon 02/26/2017 revealed a hematocrit of 25.3, hemoglobin 8.0, and MCV 83.2. Hemoccult studiesx 1 were positive in 11/19/2016 and negative x 2 in 01/2017. Ferritin was 5 with an iron saturation of 4%, and a TIBC of 487 on 01/30/2017. Normal studies included: creatinine (1.0), calcium, albumen, B12 (647), and TSH. Folic acid was 123XX123 on 08/09/2015. Urinalysis revealed no blood on 04/25/2018and 10/28/2018. SPEP was negative on 02/28/2017.  She received Venoferweekly x 3 (02/28/2017 - 03/14/2017), x 2 (04/19/2017 - 04/26/2017), x 2 (06/14/2017-06/21/2017),11/21/2017, x 3 (01/09/2018 - 01/23/2018), 02/20/2018, x 2 (08/22/2018 - 09/01/2018), 11/26/2018, x3 (02/18/2019 - 03/04/2019), and x 3 (05/13/2019 - 05/29/2019).  Ferritin has been followed: 8 on 02/28/2017, 164 on 03/19/2017, 27 on 04/18/2017, 82 on 05/17/2017, 38 on 06/10/2017, 100 on 07/18/2017, 40 on 09/18/2017, 9 on 11/19/2017, 13 on 01/02/2018, 57 on 02/19/2018, 72 on 04/04/2018, 41 on 05/21/2018, 36 on 08/18/2018,80 on 09/22/2018, 45 on 11/25/2018,96 on 01/07/2019,48 on 02/17/2019, 104 on 04/02/2019, 73 on 05/12/2019, 123 on 06/29/2019, and 55 on 08/20/2019.  EGDin 2016 revealed gastric ulcers and esophageal stricture which was dilated. Barium swallowon 11/15/2015 was normal. She has a history of mesenteric ischemia. 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 She was on Coumadin, and now on Plavix. Colonoscopyin 2008 revealed diverticulosis.   EGDon 03/28/2017 revealed a moderate-sized area of extrinsic compression was found in the mid esophagus from about 25-28 cm, no apparent defect or abnormality in the mucosa or esophageal wall. There was a 2 cm cratered esophageal ulcer. There was segmental moderate mucosal changes characterized by granularity at the gastroesophageal junction. There was localized mild inflammation characterized by congestion (edema),  depression and erythema was found on the greater curvature of the gastric body. Biopsies revealed no H pylori, dysplasia or malignancy. She is on pantoprazole and Carafate. She has esophageal spasmsimproved with Bentyl.  EGDon 06/20/2017 revealed a non-bleeding esophageal ulcer, gastritis, and a normal duodenum. Pathology revealed mild reactive gastropathy. Colonoscopyon 06/20/2017 revealed non-bleeding external hemorrhoids, diverticulosis in the sigmoid colon, and one 1 mm polyp at the recto-sigmoid colon. Pathology revealed a tubular adenoma Negative for high grade dysplasia or malignancy.  Upper EUSon 12/05/2017 revealed esophageal stenosis. Biopsy revealed CMV viral cytopathic effect.There was no evidence of malignancy. The EUS scope could not be advanced into the stenosis as it was too tight. There was loss of normal wall layers and diffusely hypoechoic measuring from 3 mm to 6 mm in thickness.   She was treated with valacyclovir x 28 days. CMV DNA quantitative by PCR was positive on 01/02/2018 and <200 IU/ml on 01/30/2018.  Upper GI endoscopyon 04/15/2018 revealed extrinsic compression in the mid esophagus. Esophageal biopsy revealed detached stratified squamous epithelium with focal reactive parakeratosis. There was no dysplasia or malignancy. There was a benign appearing esophageal stenosis. She underwentEGD with esophageal dilatationon 05/01/2018 and [facility]19/[facility].  Chest CT on 05/28/2018 revealed distal esophageal stricture at the GE junction with associated mild submucosal edema, possibly reflecting a post infectious/inflammatory stricture, underlying neoplasm not entirely excluded. There was no extrinsic compression.  She underwent PEG tube placementon 09/11/2019and exchange on 03/25/2019. She takes little by mouth.  Symptomatically, she is doing well.  She is beginning to eat some food.  Exam is unremarkable.  Plan: 1.   Labs today: CBC with  diff, ferritin, iron studies, sed rate. 2. Iron deficiency anemia  Hematocrit30.3. Hemoglobin is9.9. MCV 88.3. Ferritin 55 today. Plan additional Venofer if ferritin < 30.   No Venofer today.  B12 was 706 and folate 25 on 05/12/2019.  Retic was 1.5% (low) on 06/29/2019.  Continue to monitor. 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 Patient's primary nutrition is through her G-tube. 4.   RTC in 3 months for MD assessment, labs (CBC with diff, ferritin, iron stores, sed rate), and +/- Venofer.  I discussed the assessment and treatment plan with the patient.  The patient was provided an opportunity to ask questions and all were answered.  The patient agreed with the plan and demonstrated an understanding of the instructions.  The patient was advised to call back if the symptoms worsen or if the condition fails to improve as anticipated.  I provided 16 minutes of face-to-face time during this this encounter and > 50% was spent counseling as documented under my assessment and plan.    Lequita Asal, MD, PhD    08/20/2019, 1:58 PM  I, Selena Batten, am acting as scribe for [employer]. Mike Gip, MD, PhD.  I, Melissa C. Mike Gip, MD, have reviewed the above documentation for accuracy and completeness, and I agree with the above.

## 2019-08-20 ENCOUNTER — Other Ambulatory Visit: Payer: Self-pay

## 2019-08-20 ENCOUNTER — Ambulatory Visit: Payer: Medicare Other

## 2019-08-20 ENCOUNTER — Inpatient Hospital Stay: Payer: Medicare Other | Attending: Hematology and Oncology

## 2019-08-20 ENCOUNTER — Inpatient Hospital Stay: Payer: Medicare Other

## 2019-08-20 ENCOUNTER — Encounter: Payer: Self-pay | Admitting: Hematology and Oncology

## 2019-08-20 ENCOUNTER — Inpatient Hospital Stay (HOSPITAL_BASED_OUTPATIENT_CLINIC_OR_DEPARTMENT_OTHER): Payer: Medicare Other | Admitting: Hematology and Oncology

## 2019-08-20 VITALS — BP 142/39 | HR 65 | Temp 98.3°F | Resp 18 | Wt 118.9 lb

## 2019-08-20 DIAGNOSIS — D509 Iron deficiency anemia, unspecified: Secondary | ICD-10-CM | POA: Insufficient documentation

## 2019-08-20 DIAGNOSIS — D508 Other iron deficiency anemias: Secondary | ICD-10-CM

## 2019-08-20 DIAGNOSIS — Z931 Gastrostomy status: Secondary | ICD-10-CM | POA: Insufficient documentation

## 2019-08-20 DIAGNOSIS — K208 Other esophagitis without bleeding: Secondary | ICD-10-CM | POA: Insufficient documentation

## 2019-08-20 DIAGNOSIS — H538 Other visual disturbances: Secondary | ICD-10-CM | POA: Insufficient documentation

## 2019-08-20 LAB — CBC WITH DIFFERENTIAL/PLATELET
Abs Immature Granulocytes: 0.03 10*3/uL (ref 0.00–0.07)
Basophils Absolute: 0.1 10*3/uL (ref 0.0–0.1)
Basophils Relative: 1 %
Eosinophils Absolute: 0.7 10*3/uL — ABNORMAL HIGH (ref 0.0–0.5)
Eosinophils Relative: 10 %
HCT: 30.3 % — ABNORMAL LOW (ref 36.0–46.0)
Hemoglobin: 9.9 g/dL — ABNORMAL LOW (ref 12.0–15.0)
Immature Granulocytes: 0 %
Lymphocytes Relative: 20 %
Lymphs Abs: 1.4 10*3/uL (ref 0.7–4.0)
MCH: 28.9 pg (ref 26.0–34.0)
MCHC: 32.7 g/dL (ref 30.0–36.0)
MCV: 88.3 fL (ref 80.0–100.0)
Monocytes Absolute: 0.5 10*3/uL (ref 0.1–1.0)
Monocytes Relative: 7 %
Neutro Abs: 4.6 10*3/uL (ref 1.7–7.7)
Neutrophils Relative %: 62 %
Platelets: 305 10*3/uL (ref 150–400)
RBC: 3.43 MIL/uL — ABNORMAL LOW (ref 3.87–5.11)
RDW: 14.8 % (ref 11.5–15.5)
WBC: 7.3 10*3/uL (ref 4.0–10.5)
nRBC: 0 % (ref 0.0–0.2)

## 2019-08-20 LAB — SEDIMENTATION RATE: Sed Rate: 19 mm/hr (ref 0–30)

## 2019-08-20 LAB — IRON AND TIBC
Iron: 28 ug/dL (ref 28–170)
Saturation Ratios: 9 % — ABNORMAL LOW (ref 10.4–31.8)
TIBC: 310 ug/dL (ref 250–450)
UIBC: 282 ug/dL

## 2019-08-20 LAB — FERRITIN: Ferritin: 55 ng/mL (ref 11–307)

## 2019-08-20 NOTE — Progress Notes (Signed)
Patient here for follow up. Denies any concerns.  

## 2019-08-24 ENCOUNTER — Inpatient Hospital Stay: Payer: Medicare Other | Attending: Hematology and Oncology

## 2019-08-24 DIAGNOSIS — I359 Nonrheumatic aortic valve disorder, unspecified: Secondary | ICD-10-CM | POA: Insufficient documentation

## 2019-08-24 NOTE — Progress Notes (Signed)
Nutrition Follow-up:  Patient followed by Dr. Mike Gip for iron deficiency anemia.  Patient with history of CMV esophagitis, esophageal stenosis.  PEG placed on 06/11/18 due to unable to take enough oral intake.    Spoke with patient via phone for nutrition follow-up.  Patient reports that she has been eating more foods orally.  Reports that she can eat toast (broiled in oven) with lots of butter and gravy, soups no pieces with dissolved oyster crackers, pinto beans extra soupy and boiled egg, chopped fine with ranch dressing.  Reports that she eats very slow,small bites all through the day.  Has been taking 3.5 to 4 cartons of jevity 1.5 daily.  Takes 3.5 on days she eats more orally.  Has also been giving 59ml prosource via tube (Dr. Ginette Pitman recently increased).    Denies any nutrition issues with tube feeding or oral intake.    Medications: lasix, glipizide, Vit C, protonix  Labs: reviewed  Anthropometrics:   Weight 118 lb 1.5 oz on 11/19 increased from 115 lb on 8/13.    ? Bilateral lower extremity edema contributing to weight gain.    Estimated Energy Needs  Kcals: [id number] Protein: 78-91 g Fluid: 1.5 L  NUTRITION DIAGNOSIS: Inadequate oral intake continues   INTERVENTION:  Answered questions regarding drinking glucerna vs ensure plus.  Would recommend trying ensure plus (350 calories, 13 g protein, higher in carbohydrate) because higher in calories and protein than glucerna.  Will need to monitor blood glucose with drinking ensure plus.  Educated patient on calories and protein in 1 carton of jevity 1.5 and foods/liquids to eat orally if cuts back on jevity via tube to maintain weight.  Current tube feeding of jevity 1.5 and prosource 54ml will provide 1670 calories, 85 g protein and 1211ml free water (not including water with medications).   Patient has contact information    MONITORING, EVALUATION, GOAL: tube feeding tolerance, weight   NEXT VISIT: March 1 phone  f/u   Lunell Robart B. Zenia Resides, [place], [place] Registered Dietitian [phone] (pager)

## 2019-10-22 ENCOUNTER — Telehealth: Payer: Self-pay

## 2019-10-22 NOTE — Telephone Encounter (Signed)
Nutrition  Received message from patient to return call.   Called patient back and no answer.  Left message on voicemail.  RD to try and call patient again.   Augustina Braddock B. Zenia Resides, [place], [place] Registered Dietitian [phone] (pager)

## 2019-10-23 ENCOUNTER — Telehealth: Payer: Self-pay

## 2019-10-23 NOTE — Telephone Encounter (Signed)
Nutrition  Called patient today and was able to reach her.  Patient not receiving enough prosource (protein modular) from [place] each month.  PCP had increased to 58ml daily per feeding tube.  RD will follow-up with [facility].    Fany Cavanaugh B. Zenia Resides, [place], [place] Registered Dietitian [phone] (pager)

## 2019-11-19 ENCOUNTER — Inpatient Hospital Stay: Payer: Medicare Other

## 2019-11-19 ENCOUNTER — Inpatient Hospital Stay: Payer: Medicare Other | Attending: Hematology and Oncology | Admitting: Hematology and Oncology

## 2019-11-23 ENCOUNTER — Inpatient Hospital Stay: Admit: 2019-11-23 | Payer: Medicare Other

## 2019-11-23 ENCOUNTER — Encounter: Payer: Self-pay | Admitting: Emergency Medicine

## 2019-11-23 ENCOUNTER — Other Ambulatory Visit: Payer: Self-pay

## 2019-11-23 ENCOUNTER — Inpatient Hospital Stay
Admission: EM | Admit: 2019-11-23 | Discharge: 2019-11-26 | DRG: 291 | Disposition: A | Discharge status: Home / Self Care | Payer: Medicare Other | Attending: Internal Medicine | Admitting: Internal Medicine

## 2019-11-23 ENCOUNTER — Emergency Department: Payer: Medicare Other

## 2019-11-23 DIAGNOSIS — D509 Iron deficiency anemia, unspecified: Secondary | ICD-10-CM | POA: Diagnosis present

## 2019-11-23 DIAGNOSIS — I110 Hypertensive heart disease with heart failure: Principal | ICD-10-CM | POA: Diagnosis present

## 2019-11-23 DIAGNOSIS — Z7902 Long term (current) use of antithrombotics/antiplatelets: Secondary | ICD-10-CM

## 2019-11-23 DIAGNOSIS — I272 Pulmonary hypertension, unspecified: Secondary | ICD-10-CM | POA: Diagnosis present

## 2019-11-23 DIAGNOSIS — E1151 Type 2 diabetes mellitus with diabetic peripheral angiopathy without gangrene: Secondary | ICD-10-CM | POA: Diagnosis present

## 2019-11-23 DIAGNOSIS — E119 Type 2 diabetes mellitus without complications: Secondary | ICD-10-CM | POA: Insufficient documentation

## 2019-11-23 DIAGNOSIS — K9423 Gastrostomy malfunction: Secondary | ICD-10-CM | POA: Diagnosis present

## 2019-11-23 DIAGNOSIS — J9601 Acute respiratory failure with hypoxia: Secondary | ICD-10-CM | POA: Diagnosis present

## 2019-11-23 DIAGNOSIS — Z823 Family history of stroke: Secondary | ICD-10-CM | POA: Diagnosis not present

## 2019-11-23 DIAGNOSIS — Z79899 Other long term (current) drug therapy: Secondary | ICD-10-CM

## 2019-11-23 DIAGNOSIS — I5020 Unspecified systolic (congestive) heart failure: Secondary | ICD-10-CM | POA: Insufficient documentation

## 2019-11-23 DIAGNOSIS — E785 Hyperlipidemia, unspecified: Secondary | ICD-10-CM | POA: Diagnosis present

## 2019-11-23 DIAGNOSIS — E43 Unspecified severe protein-calorie malnutrition: Secondary | ICD-10-CM | POA: Diagnosis present

## 2019-11-23 DIAGNOSIS — Z888 Allergy status to other drugs, medicaments and biological substances status: Secondary | ICD-10-CM

## 2019-11-23 DIAGNOSIS — I350 Nonrheumatic aortic (valve) stenosis: Secondary | ICD-10-CM | POA: Diagnosis present

## 2019-11-23 DIAGNOSIS — I10 Essential (primary) hypertension: Secondary | ICD-10-CM | POA: Diagnosis present

## 2019-11-23 DIAGNOSIS — G2581 Restless legs syndrome: Secondary | ICD-10-CM | POA: Diagnosis present

## 2019-11-23 DIAGNOSIS — Z981 Arthrodesis status: Secondary | ICD-10-CM | POA: Diagnosis not present

## 2019-11-23 DIAGNOSIS — K559 Vascular disorder of intestine, unspecified: Secondary | ICD-10-CM | POA: Diagnosis present

## 2019-11-23 DIAGNOSIS — T85528A Displacement of other gastrointestinal prosthetic devices, implants and grafts, initial encounter: Secondary | ICD-10-CM

## 2019-11-23 DIAGNOSIS — E1129 Type 2 diabetes mellitus with other diabetic kidney complication: Secondary | ICD-10-CM | POA: Insufficient documentation

## 2019-11-23 DIAGNOSIS — Z8249 Family history of ischemic heart disease and other diseases of the circulatory system: Secondary | ICD-10-CM

## 2019-11-23 DIAGNOSIS — Z95820 Peripheral vascular angioplasty status with implants and grafts: Secondary | ICD-10-CM

## 2019-11-23 DIAGNOSIS — I5043 Acute on chronic combined systolic (congestive) and diastolic (congestive) heart failure: Secondary | ICD-10-CM | POA: Diagnosis present

## 2019-11-23 DIAGNOSIS — I739 Peripheral vascular disease, unspecified: Secondary | ICD-10-CM | POA: Diagnosis present

## 2019-11-23 DIAGNOSIS — I509 Heart failure, unspecified: Secondary | ICD-10-CM

## 2019-11-23 DIAGNOSIS — Z931 Gastrostomy status: Secondary | ICD-10-CM | POA: Insufficient documentation

## 2019-11-23 DIAGNOSIS — K222 Esophageal obstruction: Secondary | ICD-10-CM | POA: Diagnosis present

## 2019-11-23 DIAGNOSIS — I248 Other forms of acute ischemic heart disease: Secondary | ICD-10-CM | POA: Diagnosis present

## 2019-11-23 DIAGNOSIS — Z881 Allergy status to other antibiotic agents status: Secondary | ICD-10-CM

## 2019-11-23 DIAGNOSIS — Z20822 Contact with and (suspected) exposure to covid-19: Secondary | ICD-10-CM | POA: Diagnosis present

## 2019-11-23 DIAGNOSIS — Z833 Family history of diabetes mellitus: Secondary | ICD-10-CM | POA: Diagnosis not present

## 2019-11-23 DIAGNOSIS — Z85828 Personal history of other malignant neoplasm of skin: Secondary | ICD-10-CM

## 2019-11-23 DIAGNOSIS — E1169 Type 2 diabetes mellitus with other specified complication: Secondary | ICD-10-CM

## 2019-11-23 DIAGNOSIS — Z90710 Acquired absence of both cervix and uterus: Secondary | ICD-10-CM

## 2019-11-23 DIAGNOSIS — K219 Gastro-esophageal reflux disease without esophagitis: Secondary | ICD-10-CM | POA: Diagnosis present

## 2019-11-23 DIAGNOSIS — K2210 Ulcer of esophagus without bleeding: Secondary | ICD-10-CM | POA: Insufficient documentation

## 2019-11-23 DIAGNOSIS — J9691 Respiratory failure, unspecified with hypoxia: Secondary | ICD-10-CM | POA: Diagnosis present

## 2019-11-23 DIAGNOSIS — Z8719 Personal history of other diseases of the digestive system: Secondary | ICD-10-CM

## 2019-11-23 DIAGNOSIS — Z86011 Personal history of benign neoplasm of the brain: Secondary | ICD-10-CM

## 2019-11-23 DIAGNOSIS — Z6821 Body mass index (BMI) 21.0-21.9, adult: Secondary | ICD-10-CM

## 2019-11-23 DIAGNOSIS — D649 Anemia, unspecified: Secondary | ICD-10-CM | POA: Diagnosis present

## 2019-11-23 DIAGNOSIS — Z87442 Personal history of urinary calculi: Secondary | ICD-10-CM

## 2019-11-23 LAB — CBC WITH DIFFERENTIAL/PLATELET
Abs Immature Granulocytes: 0.04 10*3/uL (ref 0.00–0.07)
Basophils Absolute: 0.1 10*3/uL (ref 0.0–0.1)
Basophils Relative: 1 %
Eosinophils Absolute: 0.2 10*3/uL (ref 0.0–0.5)
Eosinophils Relative: 2 %
HCT: 32 % — ABNORMAL LOW (ref 36.0–46.0)
Hemoglobin: 9.8 g/dL — ABNORMAL LOW (ref 12.0–15.0)
Immature Granulocytes: 0 %
Lymphocytes Relative: 8 %
Lymphs Abs: 0.7 10*3/uL (ref 0.7–4.0)
MCH: 26.5 pg (ref 26.0–34.0)
MCHC: 30.6 g/dL (ref 30.0–36.0)
MCV: 86.5 fL (ref 80.0–100.0)
Monocytes Absolute: 0.6 10*3/uL (ref 0.1–1.0)
Monocytes Relative: 7 %
Neutro Abs: 7.5 10*3/uL (ref 1.7–7.7)
Neutrophils Relative %: 82 %
Platelets: 384 10*3/uL (ref 150–400)
RBC: 3.7 MIL/uL — ABNORMAL LOW (ref 3.87–5.11)
RDW: 16.2 % — ABNORMAL HIGH (ref 11.5–15.5)
WBC: 9.1 10*3/uL (ref 4.0–10.5)
nRBC: 0 % (ref 0.0–0.2)

## 2019-11-23 LAB — LIPID PANEL
Cholesterol: 178 mg/dL (ref 0–200)
HDL: 67 mg/dL (ref >40)
LDL Cholesterol: 101 mg/dL — ABNORMAL HIGH (ref 0–99)
Total CHOL/HDL Ratio: 2.7 RATIO
Triglycerides: 50 mg/dL (ref <150)
VLDL: 10 mg/dL (ref 0–40)

## 2019-11-23 LAB — LACTIC ACID, PLASMA
Lactic Acid, Venous: 1.6 mmol/L (ref 0.5–1.9)
Lactic Acid, Venous: 0.9 mmol/L (ref 0.5–1.9)

## 2019-11-23 LAB — PROTIME-INR
INR: 1 (ref 0.8–1.2)
Prothrombin Time: 12.9 seconds (ref 11.4–15.2)

## 2019-11-23 LAB — BASIC METABOLIC PANEL
Anion gap: 11 (ref 5–15)
BUN: 39 mg/dL — ABNORMAL HIGH (ref 8–23)
CO2: 24 mmol/L (ref 22–32)
Calcium: 9.2 mg/dL (ref 8.9–10.3)
Chloride: 107 mmol/L (ref 98–111)
Creatinine, Ser: 0.85 mg/dL (ref 0.44–1.00)
GFR calc Af Amer: >60 mL/min (ref >60)
GFR calc non Af Amer: >60 mL/min (ref >60)
Glucose, Bld: 206 mg/dL — ABNORMAL HIGH (ref 70–99)
Potassium: 4.3 mmol/L (ref 3.5–5.1)
Sodium: 142 mmol/L (ref 135–145)

## 2019-11-23 LAB — TROPONIN I (HIGH SENSITIVITY)
Troponin I (High Sensitivity): 213 ng/L (ref <18)
Troponin I (High Sensitivity): 342 ng/L
Troponin I (High Sensitivity): 438 ng/L (ref <18)

## 2019-11-23 LAB — GLUCOSE, CAPILLARY: Glucose-Capillary: 179 mg/dL — ABNORMAL HIGH (ref 70–99)

## 2019-11-23 LAB — IRON AND TIBC
Iron: 21 ug/dL — ABNORMAL LOW (ref 28–170)
Saturation Ratios: 6 % — ABNORMAL LOW (ref 10.4–31.8)
TIBC: 364 ug/dL (ref 250–450)
UIBC: 343 ug/dL

## 2019-11-23 LAB — T4, FREE: Free T4: 0.96 ng/dL (ref 0.61–1.12)

## 2019-11-23 LAB — TYPE AND SCREEN
ABO/RH(D): A POS
Antibody Screen: NEGATIVE

## 2019-11-23 LAB — HEMOGLOBIN A1C
Hgb A1c MFr Bld: 6.9 % — ABNORMAL HIGH (ref 4.8–5.6)
Mean Plasma Glucose: 151.33 mg/dL

## 2019-11-23 LAB — MRSA PCR SCREENING: MRSA by PCR: NEGATIVE

## 2019-11-23 LAB — HEPARIN LEVEL (UNFRACTIONATED): Heparin Unfractionated: 0.18 [IU]/mL — ABNORMAL LOW (ref 0.30–0.70)

## 2019-11-23 LAB — TSH: TSH: 4.466 u[IU]/mL (ref 0.350–4.500)

## 2019-11-23 LAB — POC SARS CORONAVIRUS 2 AG: SARS Coronavirus 2 Ag: NEGATIVE

## 2019-11-23 LAB — BRAIN NATRIURETIC PEPTIDE: B Natriuretic Peptide: 1969 pg/mL — ABNORMAL HIGH (ref 0.0–100.0)

## 2019-11-23 LAB — VITAMIN B12: Vitamin B-12: 960 pg/mL — ABNORMAL HIGH (ref 180–914)

## 2019-11-23 LAB — SARS CORONAVIRUS 2 (TAT 6-24 HRS): SARS Coronavirus 2: NEGATIVE

## 2019-11-23 LAB — APTT: aPTT: 27 seconds (ref 24–36)

## 2019-11-23 MED ORDER — FREE WATER
30.0000 mL | Status: DC
  Administered 2019-11-23 – 2019-11-26 (×11): 30 mL
  Filled 2019-11-23 (×3): qty 30

## 2019-11-23 MED ORDER — JEVITY 1.5 CAL/FIBER PO LIQD
237.0000 mL | Freq: Four times a day (QID) | ORAL | Status: DC
  Administered 2019-11-23 – 2019-11-25 (×6): 237 mL

## 2019-11-23 MED ORDER — ORAL CARE MOUTH RINSE
15.0000 mL | Freq: Two times a day (BID) | OROMUCOSAL | Status: DC
  Administered 2019-11-24 – 2019-11-25 (×3): 15 mL via OROMUCOSAL

## 2019-11-23 MED ORDER — CHLORHEXIDINE GLUCONATE 0.12 % MT SOLN
15.0000 mL | Freq: Two times a day (BID) | OROMUCOSAL | Status: DC
  Administered 2019-11-23 – 2019-11-25 (×4): 15 mL via OROMUCOSAL
  Filled 2019-11-23 (×6): qty 15

## 2019-11-23 MED ORDER — FUROSEMIDE 10 MG/ML IJ SOLN
60.0000 mg | Freq: Once | INTRAMUSCULAR | Status: AC
  Administered 2019-11-23: 60 mg via INTRAVENOUS
  Filled 2019-11-23: qty 8

## 2019-11-23 MED ORDER — METOPROLOL TARTRATE 25 MG PO TABS
25.0000 mg | ORAL_TABLET | Freq: Two times a day (BID) | ORAL | Status: DC
  Administered 2019-11-23 – 2019-11-26 (×6): 25 mg via ORAL
  Filled 2019-11-23 (×6): qty 1

## 2019-11-23 MED ORDER — ROPINIROLE HCL 1 MG PO TABS
2.0000 mg | ORAL_TABLET | Freq: Two times a day (BID) | ORAL | Status: DC
  Administered 2019-11-23 – 2019-11-26 (×6): 2 mg
  Filled 2019-11-23 (×7): qty 2

## 2019-11-23 MED ORDER — POLYVINYL ALCOHOL 1.4 % OP SOLN
1.0000 [drp] | Freq: Three times a day (TID) | OPHTHALMIC | Status: DC | PRN
  Administered 2019-11-25: 1 [drp] via OPHTHALMIC
  Filled 2019-11-23 (×2): qty 15

## 2019-11-23 MED ORDER — MECLIZINE HCL 25 MG PO TABS
25.0000 mg | ORAL_TABLET | Freq: Three times a day (TID) | ORAL | Status: DC | PRN
  Filled 2019-11-23: qty 1

## 2019-11-23 MED ORDER — CHLORHEXIDINE GLUCONATE CLOTH 2 % EX PADS
6.0000 | MEDICATED_PAD | Freq: Every day | CUTANEOUS | Status: DC
  Administered 2019-11-23 – 2019-11-24 (×2): 6 via TOPICAL

## 2019-11-23 MED ORDER — PANTOPRAZOLE SODIUM 40 MG PO PACK
40.0000 mg | PACK | Freq: Two times a day (BID) | ORAL | Status: DC
  Administered 2019-11-23 – 2019-11-26 (×5): 40 mg
  Filled 2019-11-23 (×8): qty 20

## 2019-11-23 MED ORDER — PRO-STAT SUGAR FREE PO LIQD
30.0000 mL | Freq: Every day | ORAL | Status: DC
  Administered 2019-11-24: 30 mL

## 2019-11-23 MED ORDER — HEPARIN (PORCINE) 25000 UT/250ML-% IV SOLN
750.0000 [IU]/h | INTRAVENOUS | Status: DC
  Administered 2019-11-23: 650 [IU]/h via INTRAVENOUS
  Filled 2019-11-23: qty 250

## 2019-11-23 MED ORDER — SODIUM CHLORIDE 0.9 % IV SOLN: 250.0000 mL | INTRAVENOUS | Status: DC | PRN

## 2019-11-23 MED ORDER — CLOPIDOGREL BISULFATE 75 MG PO TABS
75.0000 mg | ORAL_TABLET | Freq: Every day | ORAL | Status: DC
  Administered 2019-11-24 – 2019-11-26 (×3): 75 mg
  Filled 2019-11-23 (×3): qty 1

## 2019-11-23 MED ORDER — LIDOCAINE VISCOUS HCL 2 % MT SOLN
OROMUCOSAL | Status: AC
  Filled 2019-11-23: qty 15

## 2019-11-23 MED ORDER — SODIUM CHLORIDE 0.9% FLUSH
3.0000 mL | Freq: Two times a day (BID) | INTRAVENOUS | Status: DC
  Administered 2019-11-23 – 2019-11-25 (×5): 3 mL via INTRAVENOUS

## 2019-11-23 MED ORDER — SODIUM CHLORIDE FLUSH 0.9 % IV SOLN
INTRAVENOUS | Status: AC
  Filled 2019-11-23: qty 10

## 2019-11-23 MED ORDER — NITROGLYCERIN 2 % TD OINT
1.0000 [in_us] | TOPICAL_OINTMENT | TRANSDERMAL | Status: AC
  Administered 2019-11-23: 1 [in_us] via TOPICAL
  Filled 2019-11-23: qty 1

## 2019-11-23 MED ORDER — ADULT MULTIVITAMIN LIQUID CH
15.0000 mL | Freq: Every day | ORAL | Status: DC
  Administered 2019-11-24 – 2019-11-26 (×3): 15 mL
  Filled 2019-11-23 (×4): qty 15

## 2019-11-23 MED ORDER — SODIUM CHLORIDE 0.9% FLUSH
3.0000 mL | Freq: Two times a day (BID) | INTRAVENOUS | Status: DC
  Administered 2019-11-24 – 2019-11-26 (×5): 3 mL via INTRAVENOUS

## 2019-11-23 MED ORDER — ASCORBIC ACID 500 MG PO TABS
250.0000 mg | ORAL_TABLET | Freq: Two times a day (BID) | ORAL | Status: DC
  Administered 2019-11-23 – 2019-11-26 (×6): 250 mg
  Filled 2019-11-23 (×6): qty 1

## 2019-11-23 MED ORDER — ALLOPURINOL 100 MG PO TABS
100.0000 mg | ORAL_TABLET | Freq: Every day | ORAL | Status: DC
  Administered 2019-11-24 – 2019-11-26 (×3): 100 mg
  Filled 2019-11-23 (×4): qty 1

## 2019-11-23 MED ORDER — LOPERAMIDE HCL 1 MG/7.5ML PO SUSP
3.0000 mg | ORAL | Status: DC | PRN
  Filled 2019-11-23: qty 22.5

## 2019-11-23 MED ORDER — SODIUM CHLORIDE 0.9% FLUSH: 3.0000 mL | INTRAVENOUS | Status: DC | PRN

## 2019-11-23 MED ORDER — ACETAMINOPHEN 160 MG/5ML PO SOLN
650.0000 mg | Freq: Four times a day (QID) | ORAL | Status: DC | PRN
  Administered 2019-11-24: 650 mg
  Filled 2019-11-23 (×3): qty 20.3

## 2019-11-23 MED ORDER — LISINOPRIL 20 MG PO TABS
20.0000 mg | ORAL_TABLET | Freq: Every day | ORAL | Status: DC
  Administered 2019-11-24 – 2019-11-26 (×3): 20 mg via ORAL
  Filled 2019-11-23 (×3): qty 1

## 2019-11-23 MED ORDER — HEPARIN SODIUM (PORCINE) 5000 UNIT/ML IJ SOLN: 5000.0000 [IU] | Freq: Three times a day (TID) | INTRAMUSCULAR | Status: DC

## 2019-11-23 NOTE — Assessment & Plan Note (Signed)
Attribute to volume overload ? Form underlying gha. We will cycle troponin and admit to telemetry.  Cont diuretic therapy with lasix 20 mg iv bid.

## 2019-11-23 NOTE — ED Triage Notes (Signed)
Pt reports her feeding tube came out yesterday. Pt also reports last night started with some SOB and decreased appetite. Pt reports hx of CHF.

## 2019-11-23 NOTE — ED Notes (Signed)
RT notified to place patient on BiPAP.

## 2019-11-23 NOTE — Assessment & Plan Note (Addendum)
Pt has iron def anemia chronically and is on Plavix because of PAD. long standing anemia is contributing to her CHF with increased demand. Anemia panel/type and screen/iv ppi/ cont supplementation of iron/

## 2019-11-23 NOTE — Assessment & Plan Note (Signed)
As above suspect reduced ef sytolic HF. We will diurese and follow closely. Restart home meds and uptitrate cautiously.

## 2019-11-23 NOTE — Consult Note (Signed)
[facility] Consultation Note  Patient ID: [name] Day, MRN: [id number], DOB/AGE: [birth year] 86 y.o. Admit date: 11/23/2019   Date of Consult: 11/23/2019 Primary Physician: [name], MD Primary Cardiologist: Ubaldo Glassing  Chief Complaint:  Chief Complaint  Patient presents with  . Shortness of Breath  . Feeding tube came out   Reason for Consult: Shortness of breath  HPI: 86 y.o. female with known moderate aortic valve stenosis with left ventricular hypertrophy and mild LV systolic dysfunction by echocardiogram and diastolic and systolic dysfunction heart failure in the past on appropriate medication management who has had significant progression of shortness of breath multifactorial in nature.  The patient was seen in the emergency room with a chest x-ray showing pulmonary edema troponin of 213 BNP of 1969 and a hemoglobin of 9.8.  She has had an EKG showing normal sinus rhythm with nonspecific ST and T wave changes and left ventricular hypertrophy.  With oxygenation and diuresis the patient has had slight improvements of symptoms.  There is been no evidence of overt myocardial infarction at this time.  The patient has had a Covid test which appears to be negative at this time.  She is hemodynamically stable  Past Medical History:  Diagnosis Date  . Anemia    IRON INFUSIONS  . Aortic valve sclerosis   . Arrhythmia   . Arthritis    osteoarthritis  . Basal cell carcinoma of skin   . Brain tumor ([place])   . Brain tumor ([place])   . Cervical spine disease   . Diabetes mellitus without complication ([place])   . Dysrhythmia    sinus arrhythmia  . Esophageal stricture    severe, led to feeding tube placement in Sept 2019  . Esophageal ulcer without bleeding   . Gastrostomy tube dependent ([place])    DOES NOT EAT OR DRINK   . GERD (gastroesophageal reflux disease)   . History of kidney stones   . Hyperlipemia   . Hypertension   . Kidney stones   . Leaky heart valve   .  Meningioma ([place])   . 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 Pulmonary hypertension ([place])   . RLS (restless legs syndrome)   . Vertigo       Surgical History:  Past Surgical History:  Procedure Laterality Date  . ABDOMINAL HYSTERECTOMY    . APPENDECTOMY    . ARTHROGRAM KNEE Left   . BACK SURGERY     CERVIVAL NECK FUSION  . CATARACT EXTRACTION W/PHACO Left 11/11/2018   Procedure: CATARACT EXTRACTION PHACO AND INTRAOCULAR LENS PLACEMENT (IOC) LEFT, DIABETIC;  Surgeon: Birder Robson, MD;  Location: [facility] ORS;  Service: Ophthalmology;  Laterality: Left;  [place]  00:41 CDE 6.41 Fluid pack lot # [id number]  . COLONOSCOPY WITH PROPOFOL N/A 06/20/2017   Procedure: COLONOSCOPY WITH PROPOFOL;  Surgeon: Lollie Sails, MD;  Location: [facility] ENDOSCOPY;  Service: Endoscopy;  Laterality: N/A;  . ESOPHAGOGASTRODUODENOSCOPY (EGD) WITH PROPOFOL N/A 03/14/2015   Procedure: ESOPHAGOGASTRODUODENOSCOPY (EGD) WITH PROPOFOL;  Surgeon: Josefine Class, MD;  Location: [facility] ENDOSCOPY;  Service: Endoscopy;  Laterality: N/A;  . ESOPHAGOGASTRODUODENOSCOPY (EGD) WITH PROPOFOL N/A 03/28/2017   Procedure: ESOPHAGOGASTRODUODENOSCOPY (EGD) WITH PROPOFOL;  Surgeon: Lollie Sails, MD;  Location: [facility] ENDOSCOPY;  Service: Endoscopy;  Laterality: N/A;  . ESOPHAGOGASTRODUODENOSCOPY (EGD) WITH PROPOFOL N/A 06/20/2017   Procedure: ESOPHAGOGASTRODUODENOSCOPY (EGD) WITH PROPOFOL;  Surgeon: Lollie Sails, MD;  Location: [facility] ENDOSCOPY;  Service: Endoscopy;  Laterality: N/A;  . ESOPHAGOGASTRODUODENOSCOPY (EGD) WITH PROPOFOL  N/A 10/21/2017   Procedure: ESOPHAGOGASTRODUODENOSCOPY (EGD) WITH PROPOFOL;  Surgeon: Lollie Sails, MD;  Location: [facility] ENDOSCOPY;  Service: Endoscopy;  Laterality: N/A;  . ESOPHAGOGASTRODUODENOSCOPY (EGD) WITH PROPOFOL N/A 04/15/2018   Procedure: ESOPHAGOGASTRODUODENOSCOPY (EGD) WITH PROPOFOL;  Surgeon: Lollie Sails, MD;  Location: [facility] ENDOSCOPY;  Service: Endoscopy;  Laterality: N/A;  .  ESOPHAGUS SURGERY     CLOSURE  . EYE SURGERY    . HYSTERECTOMY ABDOMINAL WITH SALPINGECTOMY    . IR GASTROSTOMY TUBE MOD SED  06/11/2018  . IR GASTROSTOMY TUBE REMOVAL  03/25/2019  . PERIPHERAL VASCULAR CATHETERIZATION N/A 01/23/2016   Procedure: Visceral Venography;  Surgeon: Algernon Huxley, MD;  Location: [place] CV LAB;  Service: Cardiovascular;  Laterality: N/A;  . PERIPHERAL VASCULAR CATHETERIZATION  01/23/2016   Procedure: Peripheral Vascular Intervention;  Surgeon: Algernon Huxley, MD;  Location: [place] CV LAB;  Service: Cardiovascular;;  . UPPER ESOPHAGEAL ENDOSCOPIC ULTRASOUND (EUS) N/A 12/05/2017   Procedure: UPPER ESOPHAGEAL ENDOSCOPIC ULTRASOUND (EUS);  Surgeon: Jola Schmidt, MD;  Location: [facility] ENDOSCOPY;  Service: Endoscopy;  Laterality: N/A;  . VISCERAL ANGIOGRAPHY N/A 03/18/2017   Procedure: Visceral Angiography;  Surgeon: Algernon Huxley, MD;  Location: [place] CV LAB;  Service: Cardiovascular;  Laterality: N/A;  . VISCERAL ARTERY INTERVENTION N/A 03/18/2017   Procedure: Visceral Artery Intervention;  Surgeon: Algernon Huxley, MD;  Location: [place] CV LAB;  Service: Cardiovascular;  Laterality: N/A;     Home Meds: Prior to Admission medications   Medication Sig Start Date End Date Taking? Authorizing Provider  acetaminophen (TYLENOL) 160 MG/5ML solution Place 20.3 mLs (650 mg total) into feeding tube every 6 (six) hours as needed for mild pain. 06/13/18  Yes Demetrios Loll, MD  albuterol (VENTOLIN HFA) 108 (90 Base) MCG/ACT inhaler Inhale 2 puffs into the lungs every 4 (four) hours as needed for shortness of breath or wheezing.   Yes [provider]  allopurinol (ZYLOPRIM) 100 MG tablet Place 100 mg into feeding tube daily.   Yes [provider]  Amino Acids-Protein Hydrolys (FEEDING SUPPLEMENT, PRO-STAT SUGAR FREE 64,) LIQD Place 30 mLs into feeding tube daily. 06/13/18  Yes Demetrios Loll, MD  clopidogrel (PLAVIX) 75 MG tablet TAKE 1 TABLET BY MOUTH ONCE  DAILY Patient taking differently: Place 75 mg into feeding tube daily.  10/07/18  Yes Dew, Erskine Squibb, MD  diltiazem (CARDIZEM) 120 MG tablet Take 120 mg by mouth daily.  08/16/19  Yes [provider]  furosemide (LASIX) 10 MG/ML solution Place 60 mg into feeding tube daily.    Yes [provider]  glipiZIDE (GLUCOTROL) 5 MG tablet Place 1 tablet (5 mg total) into feeding tube daily before breakfast. 06/13/18  Yes Demetrios Loll, MD  hydrALAZINE (APRESOLINE) 50 MG tablet Place 1 tablet (50 mg total) into feeding tube 2 (two) times daily. Patient taking differently: Place 25 mg into feeding tube 2 (two) times daily.  06/13/18  Yes Demetrios Loll, MD  Hypromellose [facility] MILD) 0.2 % SOLN Place 1 drop into both eyes 3 (three) times daily as needed (dry eyes).    Yes [provider]  lisinopril (ZESTRIL) 40 MG tablet Take 20 mg by mouth daily.    Yes [provider]  loperamide (IMODIUM) 1 MG/5ML solution Take 3 mg by mouth as needed for diarrhea or loose stools.   Yes [provider]  meclizine (ANTIVERT) 25 MG tablet Place 25 mg into feeding tube as needed for dizziness.    Yes [provider]  Multiple Vitamin (MULTIVITAMIN) LIQD Place 15 mLs into feeding tube daily. 06/13/18  Yes Demetrios Loll, MD  Nutritional Supplements (FEEDING SUPPLEMENT, JEVITY 1.5 CAL/FIBER,) LIQD Place 237 mLs into feeding tube 4 (four) times daily. 06/25/18  Yes Dustin Flock, MD  pantoprazole sodium (PROTONIX) 40 mg/20 mL PACK Place 20 mLs (40 mg total) into feeding tube 2 (two) times daily. 06/25/18  Yes Dustin Flock, MD  rOPINIRole (REQUIP) 2 MG tablet Place 1 tablet (2 mg total) into feeding tube 2 (two) times daily. 06/13/18  Yes Demetrios Loll, MD  vitamin C (VITAMIN C) 250 MG tablet Place 1 tablet (250 mg total) into feeding tube 2 (two) times daily. 06/13/18  Yes Demetrios Loll, MD  Water For Irrigation, Sterile (FREE WATER) SOLN Place 30 mLs into feeding tube every 4 (four) hours.  06/25/18  Yes Dustin Flock, MD    Inpatient Medications:  . allopurinol  100 mg Per Tube Daily  . ascorbic acid  250 mg Per Tube BID  . chlorhexidine  15 mL Mouth Rinse BID  . Chlorhexidine Gluconate Cloth  6 each Topical Daily  . [START ON 11/24/2019] clopidogrel  75 mg Per Tube Daily  . feeding supplement (JEVITY 1.5 CAL/FIBER)  237 mL Per Tube QID  . feeding supplement (PRO-STAT SUGAR FREE 64)  30 mL Per Tube Daily  . free water  30 mL Per Tube Q4H  . lisinopril  20 mg Oral Daily  . mouth rinse  15 mL Mouth Rinse q12n4p  . multivitamin  15 mL Per Tube Daily  . pantoprazole sodium  40 mg Per Tube BID  . rOPINIRole  2 mg Per Tube BID  . sodium chloride flush  3 mL Intravenous Q12H  . sodium chloride flush  3 mL Intravenous Q12H   . sodium chloride    . heparin 650 Units/hr (11/23/19 1529)    Allergies:  Allergies  Allergen Reactions  . Gabapentin Swelling  . Lipitor [Atorvastatin] Other (See Comments)    Muscle aches  . Mevacor [Lovastatin] Other (See Comments)    Muscle aches  . Milk-Related Compounds Other (See Comments)    Large quantities cause headaches   . Septra [Sulfamethoxazole-Trimethoprim] Other (See Comments)    Unknown  . Statins Other (See Comments)    Muscle pain  . Sulfur Diarrhea and Nausea And Vomiting  . Zocor [Simvastatin] Other (See Comments)    Muscle aches    Social History   Socioeconomic History  . Marital status: Widowed    Spouse name: Not on file  . Number of children: Not on file  . Years of education: Not on file  . Highest education level: Not on file  Occupational History  . Not on file  Tobacco Use  . Smoking status: Never Smoker  . Smokeless tobacco: Never Used  Substance and Sexual Activity  . Alcohol use: Never  . Drug use: Never  . Sexual activity: Not Currently  Other Topics Concern  . Not on file  Social History Narrative   Lives alone.    Daughter helps her.    Social Determinants of Health   Financial  Resource Strain:   . Difficulty of Paying Living Expenses: Not on file  Food Insecurity:   . Worried About Charity fundraiser in the Last Year: Not on file  . Ran Out of Food in the Last Year: Not on file  Transportation Needs:   . Lack of Transportation (Medical): Not on file  . Lack  of Transportation (Non-Medical): Not on file  Physical Activity:   . Days of Exercise per Week: Not on file  . Minutes of Exercise per Session: Not on file  Stress:   . Feeling of Stress : Not on file  Social Connections:   . Frequency of Communication with Friends and Family: Not on file  . Frequency of Social Gatherings with Friends and Family: Not on file  . Attends Religious Services: Not on file  . Active Member of Clubs or Organizations: Not on file  . Attends Archivist Meetings: Not on file  . Marital Status: Not on file  Intimate Partner Violence:   . Fear of Current or Ex-Partner: Not on file  . Emotionally Abused: Not on file  . Physically Abused: Not on file  . Sexually Abused: Not on file     Family History  Problem Relation Age of Onset  . Stroke Mother   . Hypertension Mother   . Heart disease Mother   . Cancer Father   . Heart disease Father   . Heart attack Sister   . Diabetes Sister   . Heart attack Brother      Review of Systems Positive for shortness of breath Negative for: General:  chills, fever, night sweats or weight changes.  Cardiovascular: PND orthopnea syncope dizziness  Dermatological skin lesions rashes Respiratory: Cough congestion Urologic: Frequent urination urination at night and hematuria Abdominal: negative for nausea, vomiting, diarrhea, bright red blood per rectum, melena, or hematemesis Neurologic: negative for visual changes, and/or hearing changes  All other systems reviewed and are otherwise negative except as noted above.  Labs: No results for input(s): CKTOTAL, CKMB, TROPONINI in the last 72 hours. Lab Results  Component Value  Date   WBC 9.1 11/23/2019   HGB 9.8 (L) 11/23/2019   HCT 32.0 (L) 11/23/2019   MCV 86.5 11/23/2019   PLT 384 11/23/2019    Recent Labs  Lab 11/23/19 1023  NA 142  K 4.3  CL 107  CO2 24  BUN 39*  CREATININE 0.85  CALCIUM 9.2  GLUCOSE 206*   No results found for: CHOL, HDL, LDLCALC, TRIG No results found for: DDIMER  Radiology/Studies:  DG Chest 2 View  Result Date: 11/23/2019 CLINICAL DATA:  Acute hypoxemic EXAM: CHEST - 2 VIEW COMPARISON:  05/21/2019 FINDINGS: Cardiomegaly with Awanda Mink lines, airway cuffing, and small pleural effusions. No pneumothorax. IMPRESSION: CHF. Electronically Signed   By: Monte Fantasia M.D.   On: 11/23/2019 10:39    EKG: Normal sinus rhythm with nonspecific ST and T wave changes in the left ventricular hypertrophy  Weights: Filed Weights   11/23/19 0921 11/23/19 1501  Weight: 54 kg 54.5 kg     Physical Exam: Blood pressure 126/70, pulse (!) 102, temperature 97.8 F (36.6 C), temperature source Axillary, resp. rate 15, height 5\' 3"  (1.6 m), weight 54.5 kg, SpO2 98 %. Body mass index is 21.28 kg/m. General: Well developed, well nourished, in no acute distress. Head eyes ears nose throat: Normocephalic, atraumatic, sclera non-icteric, no xanthomas, nares are without discharge. No apparent thyromegaly and/or mass  Lungs: Normal respiratory effort.  no wheezes, basilar rales, no rhonchi.  Heart: RRR with normal S1 S2.  3-4+ murmur gallop, no rub, PMI is normal size and placement, carotid upstroke normal without bruit, jugular venous pressure is normal Abdomen: Soft, non-tender, non-distended with normoactive bowel sounds. No hepatomegaly. No rebound/guarding. No obvious abdominal masses. Abdominal aorta is normal size without bruit Extremities: Trace edema. no  cyanosis, no clubbing, no ulcers  Peripheral : 2+ bilateral upper extremity pulses, 2+ bilateral femoral pulses, 2+ bilateral dorsal pedal pulse Neuro: Alert and oriented. No facial  asymmetry. No focal deficit. Moves all extremities spontaneously. Musculoskeletal: Normal muscle tone without kyphosis Psych:  Responds to questions appropriately with a normal affect.    Assessment: 86 year old female with acute on chronic combined systolic diastolic dysfunction congestive heart failure multifactorial in nature including aortic valve stenosis anemia hypoxia hypertension slightly improved since admission with an elevated troponin most consistent with demand ischemia rather than acute coronary syndrome  Plan: 1.  Continue diuresis for pulmonary edema and acute on chronic systolic diastolic combined dysfunction 2.  Addition of ACE inhibitor and/or beta-blocker for combined systolic diastolic dysfunction congestive heart failure and hypertension control 3.  Consider repeat echocardiogram for change in LV dysfunction from echocardiogram last year for further adjustments of medication management 4.  Continue serial ECG and enzymes to assess for the possibility of myocardial infarction and/or elevated troponin 5.  Further treatment options for above after improvements of heart failure  Signed, Corey Skains M.D. [facility] 11/23/2019, 4:12 PM

## 2019-11-23 NOTE — ED Notes (Signed)
Pt transported to Xray. 

## 2019-11-23 NOTE — ED Provider Notes (Signed)
[facility] Emergency Department Provider Note   ____________________________________________   First MD Initiated Contact with Patient 11/23/19 1058     (approximate)  I have reviewed the triage vital signs and the nursing notes.   HISTORY  Chief Complaint Shortness of Breath and Feeding tube came out  EM caveat: Patient is acutely dyspneic  HPI [name] is a 86 y.o. female here for evaluation for some shortness of breath began last night, and also her gastric tube has fallen out  Patient reports has become quite short of breath.  She started feeling short of breath last night, she stopped taking her fluid pills.  She reports similar symptoms when she had problems with heart failure.  Denies fevers or chills.  Reports short of breath and she feels like she is "wheezing" and seems to be getting quite a bit worse here over the last hour or 2 in the ER  She has not had her medications  Past Medical History:  Diagnosis Date  . Anemia    IRON INFUSIONS  . Aortic valve sclerosis   . Arrhythmia   . Arthritis    osteoarthritis  . Basal cell carcinoma of skin   . Brain tumor ([place])   . Brain tumor ([place])   . Cervical spine disease   . Diabetes mellitus without complication ([place])   . Dysrhythmia    sinus arrhythmia  . Esophageal stricture    severe, led to feeding tube placement in Sept 2019  . Esophageal ulcer without bleeding   . Gastrostomy tube dependent ([place])    DOES NOT EAT OR DRINK   . GERD (gastroesophageal reflux disease)   . History of kidney stones   . Hyperlipemia   . Hypertension   . Kidney stones   . Leaky heart valve   . Meningioma ([place])   . 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 Pulmonary hypertension ([place])   . RLS (restless legs syndrome)   . Vertigo     Patient Active Problem List   Diagnosis Date Noted  . Chest pain, atypical 03/18/2019  . Tricuspid regurgitation 02/13/2019  . Heart palpitations 02/12/2019    . Hyponatremia 06/23/2018  . Protein-calorie malnutrition, severe 06/12/2018  . Esophageal stricture 06/11/2018  . Weight loss 05/29/2018  . Dysphagia 05/29/2018  . Esophageal stenosis 05/05/2018  . Hyperlipemia 04/04/2018  . Itching 01/08/2018  . Medication monitoring encounter 01/08/2018  . Paronychia of toe 01/08/2018  . Esophagitis, CMV ([place]) 01/01/2018  . 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 Hypertension 04/30/2017  . Mesenteric ischemia ([place]) 03/01/2017  . Iron deficiency anemia 02/28/2017  . PAD (peripheral artery disease) ([place]) 02/05/2017  . Hyperlipidemia type II 04/04/2016  . Pulmonary hypertension ([place]) 01/04/2015  . Generalized OA 12/13/2014  . Leg edema 12/13/2014  . Aortic valve sclerosis 02/19/2014  . Diabetes mellitus ([place]) 12/25/2013  . Acute, but ill-defined, cerebrovascular disease 12/25/2013  . Benign essential hypertension 12/25/2013  . Unilateral primary osteoarthritis, unspecified knee 12/25/2013  . Personal history of other malignant neoplasm of skin 05/28/2012    Past Surgical History:  Procedure Laterality Date  . ABDOMINAL HYSTERECTOMY    . APPENDECTOMY    . ARTHROGRAM KNEE Left   . BACK SURGERY     CERVIVAL NECK FUSION  . CATARACT EXTRACTION W/PHACO Left 11/11/2018   Procedure: CATARACT EXTRACTION PHACO AND INTRAOCULAR LENS PLACEMENT (IOC) LEFT, DIABETIC;  Surgeon: Birder Robson, MD;  Location: [facility] ORS;  Service: Ophthalmology;  Laterality: Left;  [place]  00:41 CDE 6.41 Fluid pack lot # [id number]  . COLONOSCOPY WITH PROPOFOL N/A 06/20/2017   Procedure: COLONOSCOPY WITH PROPOFOL;  Surgeon: Lollie Sails, MD;  Location[facility] ENDOSCOPY;  Service: Endoscopy;  Laterality: N/A;  . ESOPHAGOGASTRODUODENOSCOPY (EGD) WITH PROPOFOL N/A 03/14/2015   Procedure: ESOPHAGOGASTRODUODENOSCOPY (EGD) WITH PROPOFOL;  Surgeon: Josefine Class, MD;  Location: [facility] ENDOSCOPY;  Service: Endoscopy;  Laterality: N/A;  . ESOPHAGOGASTRODUODENOSCOPY (EGD)  WITH PROPOFOL N/A 03/28/2017   Procedure: ESOPHAGOGASTRODUODENOSCOPY (EGD) WITH PROPOFOL;  Surgeon: Lollie Sails, MD;  Location: [facility] ENDOSCOPY;  Service: Endoscopy;  Laterality: N/A;  . ESOPHAGOGASTRODUODENOSCOPY (EGD) WITH PROPOFOL N/A 06/20/2017   Procedure: ESOPHAGOGASTRODUODENOSCOPY (EGD) WITH PROPOFOL;  Surgeon: Lollie Sails, MD;  Location: [facility] ENDOSCOPY;  Service: Endoscopy;  Laterality: N/A;  . ESOPHAGOGASTRODUODENOSCOPY (EGD) WITH PROPOFOL N/A 10/21/2017   Procedure: ESOPHAGOGASTRODUODENOSCOPY (EGD) WITH PROPOFOL;  Surgeon: Lollie Sails, MD;  Location: [facility] ENDOSCOPY;  Service: Endoscopy;  Laterality: N/A;  . ESOPHAGOGASTRODUODENOSCOPY (EGD) WITH PROPOFOL N/A 04/15/2018   Procedure: ESOPHAGOGASTRODUODENOSCOPY (EGD) WITH PROPOFOL;  Surgeon: Lollie Sails, MD;  Location: [facility] ENDOSCOPY;  Service: Endoscopy;  Laterality: N/A;  . ESOPHAGUS SURGERY     CLOSURE  . EYE SURGERY    . HYSTERECTOMY ABDOMINAL WITH SALPINGECTOMY    . IR GASTROSTOMY TUBE MOD SED  06/11/2018  . IR GASTROSTOMY TUBE REMOVAL  03/25/2019  . PERIPHERAL VASCULAR CATHETERIZATION N/A 01/23/2016   Procedure: Visceral Venography;  Surgeon: Algernon Huxley, MD;  Location: [place] CV LAB;  Service: Cardiovascular;  Laterality: N/A;  . PERIPHERAL VASCULAR CATHETERIZATION  01/23/2016   Procedure: Peripheral Vascular Intervention;  Surgeon: Algernon Huxley, MD;  Location: [place] CV LAB;  Service: Cardiovascular;;  . UPPER ESOPHAGEAL ENDOSCOPIC ULTRASOUND (EUS) N/A 12/05/2017   Procedure: UPPER ESOPHAGEAL ENDOSCOPIC ULTRASOUND (EUS);  Surgeon: Jola Schmidt, MD;  Location: [facility] ENDOSCOPY;  Service: Endoscopy;  Laterality: N/A;  . VISCERAL ANGIOGRAPHY N/A 03/18/2017   Procedure: Visceral Angiography;  Surgeon: Algernon Huxley, MD;  Location: [place] CV LAB;  Service: Cardiovascular;  Laterality: N/A;  . VISCERAL ARTERY INTERVENTION N/A 03/18/2017   Procedure: Visceral Artery Intervention;  Surgeon: Algernon Huxley,  MD;  Location: [place] CV LAB;  Service: Cardiovascular;  Laterality: N/A;    Prior to Admission medications   Medication Sig Start Date End Date Taking? Authorizing Provider  acetaminophen (TYLENOL) 160 MG/5ML solution Place 20.3 mLs (650 mg total) into feeding tube every 6 (six) hours as needed for mild pain. 06/13/18   Demetrios Loll, MD  allopurinol (ZYLOPRIM) 100 MG tablet Place 100 mg into feeding tube daily.    [provider]  Amino Acids-Protein Hydrolys (FEEDING SUPPLEMENT, PRO-STAT SUGAR FREE 64,) LIQD Place 30 mLs into feeding tube daily. 06/13/18   Demetrios Loll, MD  clopidogrel (PLAVIX) 75 MG tablet TAKE 1 TABLET BY MOUTH ONCE DAILY Patient taking differently: Place 75 mg into feeding tube daily.  10/07/18   Algernon Huxley, MD  diltiazem (CARDIZEM) 120 MG tablet  08/16/19   [provider]  furosemide (LASIX) 10 MG/ML solution 60 mg by Nasogastric route daily.  07/31/19   [provider]  glipiZIDE (GLUCOTROL) 5 MG tablet Place 1 tablet (5 mg total) into feeding tube daily before breakfast. 06/13/18   Demetrios Loll, MD  hydrALAZINE (APRESOLINE) 50 MG tablet Place 1 tablet (50 mg total) into feeding tube 2 (two) times daily. 06/13/18   Demetrios Loll, MD  Hypromellose [facility] MILD) 0.2 % SOLN Place 1 drop into  both eyes 3 (three) times daily.    [provider]  lisinopril (ZESTRIL) 40 MG tablet 20 mg daily.  06/29/19   [provider]  loperamide (IMODIUM) 1 MG/5ML solution Take 3 mg by mouth as needed for diarrhea or loose stools.    [provider]  meclizine (ANTIVERT) 25 MG tablet Place 1 tablet into feeding tube as needed.  01/26/19   [provider]  Multiple Vitamin (MULTIVITAMIN) LIQD Place 15 mLs into feeding tube daily. 06/13/18   Demetrios Loll, MD  Nutritional Supplements (FEEDING SUPPLEMENT, JEVITY 1.5 CAL/FIBER,) LIQD Place 237 mLs into feeding tube 4 (four) times daily. 06/25/18   Dustin Flock, MD  pantoprazole sodium  (PROTONIX) 40 mg/20 mL PACK Place 20 mLs (40 mg total) into feeding tube 2 (two) times daily. 06/25/18   Dustin Flock, MD  rOPINIRole (REQUIP) 2 MG tablet Place 1 tablet (2 mg total) into feeding tube 2 (two) times daily. 06/13/18   Demetrios Loll, MD  vitamin C (VITAMIN C) 250 MG tablet Place 1 tablet (250 mg total) into feeding tube 2 (two) times daily. 06/13/18   Demetrios Loll, MD  Water For Irrigation, Sterile (FREE WATER) SOLN Place 30 mLs into feeding tube every 4 (four) hours. 06/25/18   Dustin Flock, MD    Allergies Gabapentin, Lipitor [atorvastatin], Mevacor [lovastatin], Milk-related compounds, Septra [sulfamethoxazole-trimethoprim], Statins, Sulfur, and Zocor [simvastatin]  Family History  Problem Relation Age of Onset  . Stroke Mother   . Hypertension Mother   . Cancer Father   . Heart attack Sister   . Diabetes Sister   . Heart attack Brother     Social History Social History   Tobacco Use  . Smoking status: Never Smoker  . Smokeless tobacco: Never Used  Substance Use Topics  . Alcohol use: Never  . Drug use: Never    Review of Systems  EM caveat Patient's daughter reports that there is been no known Covid exposure, she has been isolating at home through the pandemic   ____________________________________________   PHYSICAL EXAM:  VITAL SIGNS: ED Triage Vitals [11/23/19 0921]  Enc Vitals Group     BP (!) 156/71     Pulse Rate (!) 110     Resp 19     Temp 98.5 F (36.9 C)     Temp Source Oral     SpO2 94 %     Weight 119 lb (54 kg)     Height 5\' 3"  (1.6 m)     Head Circumference      Peak Flow      Pain Score 0     Pain Loc      Pain Edu?      Excl. in [place]?     Constitutional: Alert and oriented.  Dyspneic.  Moderate increased work of breathing Eyes: Conjunctivae are normal. Head: Atraumatic. Nose: No congestion/rhinnorhea. Mouth/Throat: Mucous membranes are moist. Neck: No stridor.  Cardiovascular: Slightly tachycardic rate, regular rhythm.  Grossly normal heart sounds.  Good peripheral circulation. Respiratory: Normal respiratory effort.  No retractions.  Wet crackles in the lower bases bilateral Gastrointestinal: Soft and nontender. No distention.  Gastric tube has fallen out, patient has it with her and has the bulb intact. Musculoskeletal: No lower extremity tenderness moderate bilateral lower extremity edema Neurologic:  Normal speech and language. No gross focal neurologic deficits are appreciated.  Skin:  Skin is warm, dry and intact. No rash noted. Psychiatric: Mood and affect are somewhat anxious. Speech and behavior are  normal.  ____________________________________________   LABS (all labs ordered are listed, but only abnormal results are displayed)  Labs Reviewed  CBC WITH DIFFERENTIAL/PLATELET - Abnormal; Notable for the following components:      Result Value   RBC 3.70 (*)    Hemoglobin 9.8 (*)    HCT 32.0 (*)    RDW 16.2 (*)    All other components within normal limits  BASIC METABOLIC PANEL - Abnormal; Notable for the following components:   Glucose, Bld 206 (*)    BUN 39 (*)    All other components within normal limits  BRAIN NATRIURETIC PEPTIDE - Abnormal; Notable for the following components:   B Natriuretic Peptide 1,969.0 (*)    All other components within normal limits  SARS CORONAVIRUS 2 (TAT 6-24 HRS)  POC SARS CORONAVIRUS 2 AG -  ED  POC SARS CORONAVIRUS 2 AG  TROPONIN I (HIGH SENSITIVITY)   ____________________________________________  EKG  Reviewed inter by me at 930 Heart rate 110 QRS 100 QTc 460 Sinus tachycardia, mild nonspecific T wave abnormality.    Mild T wave abnormality inferior some also present laterally. ____________________________________________  RADIOLOGY  DG Chest 2 View  Result Date: 11/23/2019 CLINICAL DATA:  Acute hypoxemic EXAM: CHEST - 2 VIEW COMPARISON:  05/21/2019 FINDINGS: Cardiomegaly with Awanda Mink lines, airway cuffing, and small pleural effusions. No  pneumothorax. IMPRESSION: CHF. Electronically Signed   By: Monte Fantasia M.D.   On: 11/23/2019 10:39    Chest x-ray reviewed by me, concerning for CHF ____________________________________________   PROCEDURES  Procedure(s) performed: None  Procedures  Critical Care performed: Yes, see critical care note(s)  CRITICAL CARE Performed by: Delman Kitten   Total critical care time: 30 minutes  Critical care time was exclusive of separately billable procedures and treating other patients.  Critical care was necessary to treat or prevent imminent or life-threatening deterioration.  Critical care was time spent personally by me on the following activities: development of treatment plan with patient and/or surrogate as well as nursing, discussions with consultants, evaluation of patient's response to treatment, examination of patient, obtaining history from patient or surrogate, ordering and performing treatments and interventions, ordering and review of laboratory studies, ordering and review of radiographic studies, pulse oximetry and re-evaluation of patient's condition.  ____________________________________________   INITIAL IMPRESSION / ASSESSMENT AND PLAN / ED COURSE  Pertinent labs & imaging results that were available during my care of the patient were reviewed by me and considered in my medical decision making (see chart for details).     Clinical Course as of Nov 22 1250  Mon Nov 23, 2019  1105 Patient came for evaluation of feeding tube falling out, however nursing readily notified me that she become acutely short of breath after checks x-ray.  She has crackles in the bases bilaterally, dyspnea, tachypnea moderate, wet crackles, and appears moderately dyspneic at this point.  She has very mild hypoxia on 4 L nasal cannula at this time.  She appears volume overloaded with blood pressure of [id number] systolic.  Have ordered IV Lasix and BiPAP for urgent initiation she appears grossly  volume overloaded.   [MQ]  1131 Patient does report some improvement in work of breathing.  She appears more comfortable on BiPAP.  Adjusted mask slightly with improvement.  Blood pressure remains elevated about [id number] systolic, will Place Nitropaste at this time.  Have placed consultation for gastric tube evaluation with Dr. Marius Ditch as it has fallen out, but at this point with her respiratory failure  do not see an easy way for me to replace this at the bedside at this exact moment in time.   [MQ]    Clinical Course User Index [MQ] Delman Kitten, MD   ----------------------------------------- 12:52 PM on 11/23/2019 -----------------------------------------  Patient continuing on BiPAP.  Appears to be improving with regard to work of breathing.  Hospitalist Dr. Posey Pronto currently seeing the patient.  Covid negative on rapid testing.  Discussed with patient's daughter as well, she is very active involved in her care and brought her to the ER today understand plan for admission.  Patient understand plan for admission.  Await consultation from GI (Dr. Marius Ditch) for recommendations on gastric tube placement which is currently challenged by the patient's respiratory status, need to be upright on BiPAP utilization.  Good urine output at this time.   ____________________________________________   FINAL CLINICAL IMPRESSION(S) / ED DIAGNOSES  Final diagnoses:  Acute on chronic congestive heart failure, unspecified heart failure type ([place])  Acute respiratory failure with hypoxia (HCC)  Malfunction of gastrostomy tube [facility])        Note:  This document was prepared using Dragon voice recognition software and may include unintentional dictation errors       Delman Kitten, MD 11/23/19 1253

## 2019-11-23 NOTE — ED Notes (Signed)
Upon moving patient into a room and getting placed on monitor; pt room air saturation 87-89%.  Pt placed on 2L nasal cannula to maintain saturation > 92%.

## 2019-11-23 NOTE — Procedures (Signed)
CTSP re g tube fell out We do not have 72F in stock. 35F Foley balloon-retention catheter easily  placed via mature tract bedside for now --  [facility] for tube feeds and liquid meds. We can order a replacement tube for her and replace in 1-2 d.

## 2019-11-23 NOTE — Assessment & Plan Note (Signed)
Pt continued on her plavix.

## 2019-11-23 NOTE — ED Notes (Signed)
Pt appears to be decompensating; work of breathing noted to be increased, oxygen saturation 84% on 2L.  Pt oxygen bumped to 4L in attempt to improve oxygenation.  EDP, Quale notified.

## 2019-11-23 NOTE — ED Notes (Signed)
EDP, Quale to bedside.

## 2019-11-23 NOTE — Assessment & Plan Note (Signed)
Home regimen held to prevent hypoglycemia.  Cont ssi and accucheck and a1c.

## 2019-11-23 NOTE — ED Triage Notes (Signed)
Pt reports the area around her feeding tube started hurting so she removed the bandage and when she did the tube came out. Pt reports after the tube was out it stopped hurting. Pt reports tube was placed in September due to a blocked esophagus

## 2019-11-23 NOTE — Plan of Care (Signed)

## 2019-11-23 NOTE — Progress Notes (Signed)
Shift summary:  - Patient admitted to SDU on BiPAP. - COVID swab pending. - G-tube replaced at bedside by IR MD.

## 2019-11-23 NOTE — Assessment & Plan Note (Signed)
Pt also has h/o esophageal stricture and stenosis and GERD and is NPO and has g/tube because of this  And only takes meds by mouth per pt.  Continue home regimen with tube feeding once gi evaluated and puts feeding tube back

## 2019-11-23 NOTE — Consult Note (Signed)
[place] for Heparin Indication: chest pain/ACS  Allergies  Allergen Reactions  . Gabapentin Swelling  . Lipitor [Atorvastatin] Other (See Comments)    Muscle aches  . Mevacor [Lovastatin] Other (See Comments)    Muscle aches  . Milk-Related Compounds Other (See Comments)    Large quantities cause headaches   . Septra [Sulfamethoxazole-Trimethoprim] Other (See Comments)    Unknown  . Statins Other (See Comments)    Muscle pain  . Sulfur Diarrhea and Nausea And Vomiting  . Zocor [Simvastatin] Other (See Comments)    Muscle aches    Patient Measurements: Height: 5\' 3"  (160 cm) Weight: 119 lb (54 kg) IBW/kg (Calculated) : 52.4 Heparin Dosing Weight: 54 kg  Vital Signs: Temp: 98.5 F (36.9 C) (02/22 0921) Temp Source: Oral (02/22 0921) BP: 174/81 (02/22 1130) Pulse Rate: 118 (02/22 1130)  Labs: Recent Labs    11/23/19 1023  HGB 9.8*  HCT 32.0*  PLT 384  CREATININE 0.85  TROPONINIHS 213*    Estimated Creatinine Clearance: 37.8 mL/min (by C-G formula based on SCr of 0.85 mg/dL).   Medical History: Past Medical History:  Diagnosis Date  . Anemia    IRON INFUSIONS  . Aortic valve sclerosis   . Arrhythmia   . Arthritis    osteoarthritis  . Basal cell carcinoma of skin   . Brain tumor ([place])   . Brain tumor ([place])   . Cervical spine disease   . Diabetes mellitus without complication ([place])   . Dysrhythmia    sinus arrhythmia  . Esophageal stricture    severe, led to feeding tube placement in Sept 2019  . Esophageal ulcer without bleeding   . Gastrostomy tube dependent ([place])    DOES NOT EAT OR DRINK   . GERD (gastroesophageal reflux disease)   . History of kidney stones   . Hyperlipemia   . Hypertension   . Kidney stones   . Leaky heart valve   . Meningioma ([place])   . Occlusive mesenteric ischemia ([place])   . Pulmonary hypertension ([place])   . RLS (restless legs syndrome)   . Vertigo     Medications:  (Not in a  hospital admission)  Scheduled:  . allopurinol  100 mg Per Tube Daily  . ascorbic acid  250 mg Per Tube BID  . [START ON 11/24/2019] clopidogrel  75 mg Per Tube Daily  . feeding supplement (JEVITY 1.5 CAL/FIBER)  237 mL Per Tube QID  . feeding supplement (PRO-STAT SUGAR FREE 64)  30 mL Per Tube Daily  . free water  30 mL Per Tube Q4H  . heparin  5,000 Units Subcutaneous Q8H  . Hypromellose  1 drop Both Eyes TID  . lisinopril  20 mg Oral Daily  . multivitamin  15 mL Per Tube Daily  . pantoprazole sodium  40 mg Per Tube BID  . rOPINIRole  2 mg Per Tube BID  . sodium chloride flush  3 mL Intravenous Q12H  . sodium chloride flush  3 mL Intravenous Q12H   Infusions:  . sodium chloride     PRN: sodium chloride, acetaminophen, loperamide HCl, meclizine, sodium chloride flush Anti-infectives (From admission, onward)   None      Assessment: Pharmacy consulted to start heparin for ACS. No bolus per MD. Pt is on plavix.  Goal of Therapy:  Heparin level 0.3-0.7 units/ml Monitor platelets by anticoagulation protocol: Yes   Plan:  Start heparin infusion at 650 units/hr Check anti-Xa level in  8 hours and daily while on heparin Continue to monitor H&H and platelets  Oswald Hillock, PharmD, BCPS 11/23/2019,2:08 PM

## 2019-11-23 NOTE — H&P (Signed)
History and Physical    [name] Day [id number] DOB: 04-Nov-1928 DOA: 11/23/2019   PCP: [name], MD   Patient coming from: home  Chief Complaint:  G tube falling out.  HPI: [name] Day is a 86 y.o. female with medical history significant of anemia/ chf/ htn/ dm II. Patient reports has become quite short of breath.  She started feeling short of breath last night, she stopped taking her fluid pills. She reports similar symptoms when she had problems with heart failure. Denies fevers or chills.  Reports short of breath and she feels like she is "wheezing" and seems to be getting quite a bit worse here over the last hour or 2 in the ER . She has not had her medications. Pt also has h/o SMA stent. She sees dr Ubaldo Glassing her cardiology.  HPI is limited as pt is on bipap for her episode of cute hypoxic respiratory failure.   ED Course:   Blood pressure (!) 174/81, pulse (!) 118, temperature 98.5 F (36.9 C), temperature source Oral, resp. rate (!) 32, height 5\' 3"  (1.6 m), weight 54 kg, SpO2 97 %.  Pt is alert/awake and is on bipap. She initially came in for her g tube falling out but became sob and on way to xray she became hypoxic  And was immediately started on iv diuretic and bipap. Initial labs wnl except for her hemoglobin of 9.8   Review of Systems: As per HPI otherwise 10 point review of systems negative.   Past Medical History:  Diagnosis Date  . Anemia    IRON INFUSIONS  . Aortic valve sclerosis   . Arrhythmia   . Arthritis    osteoarthritis  . Basal cell carcinoma of skin   . Brain tumor ([place])   . Brain tumor ([place])   . Cervical spine disease   . Diabetes mellitus without complication ([place])   . Dysrhythmia    sinus arrhythmia  . Esophageal stricture    severe, led to feeding tube placement in Sept 2019  . Esophageal ulcer without bleeding   . Gastrostomy tube dependent ([place])    DOES NOT EAT OR DRINK   . GERD (gastroesophageal reflux disease)     . History of kidney stones   . Hyperlipemia   . Hypertension   . Kidney stones   . Leaky heart valve   . Meningioma ([place])   . 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 Pulmonary hypertension ([place])   . RLS (restless legs syndrome)   . Vertigo     Past Surgical History:  Procedure Laterality Date  . ABDOMINAL HYSTERECTOMY    . APPENDECTOMY    . ARTHROGRAM KNEE Left   . BACK SURGERY     CERVIVAL NECK FUSION  . CATARACT EXTRACTION W/PHACO Left 11/11/2018   Procedure: CATARACT EXTRACTION PHACO AND INTRAOCULAR LENS PLACEMENT (IOC) LEFT, DIABETIC;  Surgeon: Birder Robson, MD;  Location: [facility] ORS;  Service: Ophthalmology;  Laterality: Left;  [place]  00:41 CDE 6.41 Fluid pack lot # [id number]  . COLONOSCOPY WITH PROPOFOL N/A 06/20/2017   Procedure: COLONOSCOPY WITH PROPOFOL;  Surgeon: Lollie Sails, MD;  Location: [facility] ENDOSCOPY;  Service: Endoscopy;  Laterality: N/A;  . ESOPHAGOGASTRODUODENOSCOPY (EGD) WITH PROPOFOL N/A 03/14/2015   Procedure: ESOPHAGOGASTRODUODENOSCOPY (EGD) WITH PROPOFOL;  Surgeon: Josefine Class, MD;  Location: [facility] ENDOSCOPY;  Service: Endoscopy;  Laterality: N/A;  . ESOPHAGOGASTRODUODENOSCOPY (EGD) WITH PROPOFOL N/A 03/28/2017   Procedure: ESOPHAGOGASTRODUODENOSCOPY (EGD) WITH PROPOFOL;  Surgeon: Loistine Simas  U, MD;  Location: [facility] ENDOSCOPY;  Service: Endoscopy;  Laterality: N/A;  . ESOPHAGOGASTRODUODENOSCOPY (EGD) WITH PROPOFOL N/A 06/20/2017   Procedure: ESOPHAGOGASTRODUODENOSCOPY (EGD) WITH PROPOFOL;  Surgeon: Lollie Sails, MD;  Location: [facility] ENDOSCOPY;  Service: Endoscopy;  Laterality: N/A;  . ESOPHAGOGASTRODUODENOSCOPY (EGD) WITH PROPOFOL N/A 10/21/2017   Procedure: ESOPHAGOGASTRODUODENOSCOPY (EGD) WITH PROPOFOL;  Surgeon: Lollie Sails, MD;  Location: [facility] ENDOSCOPY;  Service: Endoscopy;  Laterality: N/A;  . ESOPHAGOGASTRODUODENOSCOPY (EGD) WITH PROPOFOL N/A 04/15/2018   Procedure: ESOPHAGOGASTRODUODENOSCOPY (EGD) WITH PROPOFOL;  Surgeon:  Lollie Sails, MD;  Location: [facility] ENDOSCOPY;  Service: Endoscopy;  Laterality: N/A;  . ESOPHAGUS SURGERY     CLOSURE  . EYE SURGERY    . HYSTERECTOMY ABDOMINAL WITH SALPINGECTOMY    . IR GASTROSTOMY TUBE MOD SED  06/11/2018  . IR GASTROSTOMY TUBE REMOVAL  03/25/2019  . PERIPHERAL VASCULAR CATHETERIZATION N/A 01/23/2016   Procedure: Visceral Venography;  Surgeon: Algernon Huxley, MD;  Location: [place] CV LAB;  Service: Cardiovascular;  Laterality: N/A;  . PERIPHERAL VASCULAR CATHETERIZATION  01/23/2016   Procedure: Peripheral Vascular Intervention;  Surgeon: Algernon Huxley, MD;  Location: [place] CV LAB;  Service: Cardiovascular;;  . UPPER ESOPHAGEAL ENDOSCOPIC ULTRASOUND (EUS) N/A 12/05/2017   Procedure: UPPER ESOPHAGEAL ENDOSCOPIC ULTRASOUND (EUS);  Surgeon: Jola Schmidt, MD;  Location: [facility] ENDOSCOPY;  Service: Endoscopy;  Laterality: N/A;  . VISCERAL ANGIOGRAPHY N/A 03/18/2017   Procedure: Visceral Angiography;  Surgeon: Algernon Huxley, MD;  Location: [place] CV LAB;  Service: Cardiovascular;  Laterality: N/A;  . VISCERAL ARTERY INTERVENTION N/A 03/18/2017   Procedure: Visceral Artery Intervention;  Surgeon: Algernon Huxley, MD;  Location: [place] CV LAB;  Service: Cardiovascular;  Laterality: N/A;     reports that she has never smoked. She has never used smokeless tobacco. She reports that she does not drink alcohol or use drugs.  Allergies  Allergen Reactions  . Gabapentin Swelling  . Lipitor [Atorvastatin] Other (See Comments)    Muscle aches  . Mevacor [Lovastatin] Other (See Comments)    Muscle aches  . Milk-Related Compounds Other (See Comments)    Large quantities cause headaches   . Septra [Sulfamethoxazole-Trimethoprim] Other (See Comments)    Unknown  . Statins Other (See Comments)    Muscle pain  . Sulfur Diarrhea and Nausea And Vomiting  . Zocor [Simvastatin] Other (See Comments)    Muscle aches    Family History  Problem Relation Age of Onset  .  Stroke Mother   . Hypertension Mother   . Heart disease Mother   . Cancer Father   . Heart disease Father   . Heart attack Sister   . Diabetes Sister   . Heart attack Brother     Prior to Admission medications   Medication Sig Start Date End Date Taking? Authorizing Provider  acetaminophen (TYLENOL) 160 MG/5ML solution Place 20.3 mLs (650 mg total) into feeding tube every 6 (six) hours as needed for mild pain. 06/13/18   Demetrios Loll, MD  allopurinol (ZYLOPRIM) 100 MG tablet Place 100 mg into feeding tube daily.    [provider]  Amino Acids-Protein Hydrolys (FEEDING SUPPLEMENT, PRO-STAT SUGAR FREE 64,) LIQD Place 30 mLs into feeding tube daily. 06/13/18   Demetrios Loll, MD  clopidogrel (PLAVIX) 75 MG tablet TAKE 1 TABLET BY MOUTH ONCE DAILY Patient taking differently: Place 75 mg into feeding tube daily.  10/07/18   Algernon Huxley, MD  diltiazem (CARDIZEM) 120 MG tablet  08/16/19  [provider]  furosemide (LASIX) 10 MG/ML solution 60 mg by Nasogastric route daily.  07/31/19   [provider]  glipiZIDE (GLUCOTROL) 5 MG tablet Place 1 tablet (5 mg total) into feeding tube daily before breakfast. 06/13/18   Demetrios Loll, MD  hydrALAZINE (APRESOLINE) 50 MG tablet Place 1 tablet (50 mg total) into feeding tube 2 (two) times daily. 06/13/18   Demetrios Loll, MD  Hypromellose [facility] MILD) 0.2 % SOLN Place 1 drop into both eyes 3 (three) times daily.    [provider]  lisinopril (ZESTRIL) 40 MG tablet 20 mg daily.  06/29/19   [provider]  loperamide (IMODIUM) 1 MG/5ML solution Take 3 mg by mouth as needed for diarrhea or loose stools.    [provider]  meclizine (ANTIVERT) 25 MG tablet Place 1 tablet into feeding tube as needed.  01/26/19   [provider]  Multiple Vitamin (MULTIVITAMIN) LIQD Place 15 mLs into feeding tube daily. 06/13/18   Demetrios Loll, MD  Nutritional Supplements (FEEDING SUPPLEMENT, JEVITY 1.5 CAL/FIBER,) LIQD Place 237  mLs into feeding tube 4 (four) times daily. 06/25/18   Dustin Flock, MD  pantoprazole sodium (PROTONIX) 40 mg/20 mL PACK Place 20 mLs (40 mg total) into feeding tube 2 (two) times daily. 06/25/18   Dustin Flock, MD  rOPINIRole (REQUIP) 2 MG tablet Place 1 tablet (2 mg total) into feeding tube 2 (two) times daily. 06/13/18   Demetrios Loll, MD  vitamin C (VITAMIN C) 250 MG tablet Place 1 tablet (250 mg total) into feeding tube 2 (two) times daily. 06/13/18   Demetrios Loll, MD  Water For Irrigation, Sterile (FREE WATER) SOLN Place 30 mLs into feeding tube every 4 (four) hours. 06/25/18   Dustin Flock, MD    Physical Exam: Vitals:   11/23/19 0945 11/23/19 1045 11/23/19 1124 11/23/19 1130  BP: (!) 155/85 (!) 178/107 (!) 172/80 (!) 174/81  Pulse: (!) 112 (!) 120 (!) 120 (!) 118  Resp: (!) 26 (!) 27 (!) 23 (!) 32  Temp:      TempSrc:      SpO2: 90% (!) 84% 98% 97%  Weight:      Height:          Constitutional: NAD, calm, comfortable Vitals:   11/23/19 0945 11/23/19 1045 11/23/19 1124 11/23/19 1130  BP: (!) 155/85 (!) 178/107 (!) 172/80 (!) 174/81  Pulse: (!) 112 (!) 120 (!) 120 (!) 118  Resp: (!) 26 (!) 27 (!) 23 (!) 32  Temp:      TempSrc:      SpO2: 90% (!) 84% 98% 97%  Weight:      Height:       Eyes: PERRL, lids and conjunctivae normal ENMT: Mucous membranes are moist. Posterior pharynx clear of any exudate or lesions.Normal dentition.  Neck: normal, supple, no masses, no thyromegaly Respiratory: Bl crackles all throughout lung field posterior. . Normal respiratory effort. No accessory muscle use.  Cardiovascular: Regular rate and rhythm, 2/6 murmur in pulmonic area. No  rubs / gallops. No extremity edema. 2+ pedal pulses. No carotid bruits.  Abdomen: no tenderness, no masses palpated. No hepatosplenomegaly. Bowel sounds positive.  Musculoskeletal: no clubbing / cyanosis. No joint deformity upper and lower extremities. Good ROM, no contractures. Normal muscle tone.  Skin: no  rashes, lesions, ulcers. No induration Neurologic: CN 2-12 grossly intact. Pt moving all 4 ext.  Psychiatric: Normal judgment and insight. Alert and oriented x 3. Normal mood.   Labs on Admission:  I have personally reviewed following labs and imaging studies  CBC: Recent Labs  Lab 11/23/19 1023  WBC 9.1  NEUTROABS 7.5  HGB 9.8*  HCT 32.0*  MCV 86.5  PLT 0000000   Basic Metabolic Panel: Recent Labs  Lab 11/23/19 1023  NA 142  K 4.3  CL 107  CO2 24  GLUCOSE 206*  BUN 39*  CREATININE 0.85  CALCIUM 9.2   GFR: Estimated Creatinine Clearance: 37.8 mL/min (by C-G formula based on SCr of 0.85 mg/dL). Liver Function Tests: No results for input(s): AST, ALT, ALKPHOS, BILITOT, PROT, ALBUMIN in the last 168 hours. No results for input(s): LIPASE, AMYLASE in the last 168 hours. No results for input(s): AMMONIA in the last 168 hours. Coagulation Profile: No results for input(s): INR, PROTIME in the last 168 hours. Cardiac Enzymes: No results for input(s): CKTOTAL, CKMB, CKMBINDEX, TROPONINI in the last 168 hours. BNP (last 3 results) No results for input(s): PROBNP in the last 8760 hours. HbA1C: No results for input(s): HGBA1C in the last 72 hours. CBG: No results for input(s): GLUCAP in the last 168 hours. Lipid Profile: No results for input(s): CHOL, HDL, LDLCALC, TRIG, CHOLHDL, LDLDIRECT in the last 72 hours. Thyroid Function Tests: No results for input(s): TSH, T4TOTAL, FREET4, T3FREE, THYROIDAB in the last 72 hours. Anemia Panel: No results for input(s): VITAMINB12, FOLATE, FERRITIN, TIBC, IRON, RETICCTPCT in the last 72 hours. Urine analysis:    Component Value Date/Time   COLORURINE YELLOW (A) 10/28/2018 0419   APPEARANCEUR CLEAR (A) 10/28/2018 0419   LABSPEC 1.014 10/28/2018 0419   PHURINE 7.0 10/28/2018 0419   GLUCOSEU NEGATIVE 10/28/2018 0419   HGBUR NEGATIVE 10/28/2018 0419   BILIRUBINUR NEGATIVE 10/28/2018 0419   KETONESUR NEGATIVE 10/28/2018 0419   PROTEINUR  NEGATIVE 10/28/2018 0419   NITRITE NEGATIVE 10/28/2018 0419   LEUKOCYTESUR SMALL (A) 10/28/2018 0419    Radiological Exams on Admission: DG Chest 2 View  Result Date: 11/23/2019 CLINICAL DATA:  Acute hypoxemic EXAM: CHEST - 2 VIEW COMPARISON:  05/21/2019 FINDINGS: Cardiomegaly with Awanda Mink lines, airway cuffing, and small pleural effusions. No pneumothorax. IMPRESSION: CHF. Electronically Signed   By: Monte Fantasia M.D.   On: 11/23/2019 10:39    EKG: Independently reviewed . Assessment/Plan Principal Problem:   Respiratory failure with hypoxia (HCC) Active Problems:   PAD (peripheral artery disease) (HCC)   Mesenteric ischemia (HCC)   Diabetes mellitus ([place])   Hypertension   Esophageal stenosis   Esophageal stricture   GERD (gastroesophageal reflux disease)   Anemia   CHF exacerbation (HCC)  Problem Assessment/Plan  Respiratory failure with hypoxia ([place]) Assessment & Plan Attribute to volume overload ? Form underlying gha. We will cycle troponin and admit to telemetry.  Cont diuretic therapy with lasix 20 mg iv bid.   CHF exacerbation ([place]) Assessment & Plan As above suspect reduced ef sytolic HF. We will diurese and follow closely. Restart home meds and uptitrate cautiously.   NSTEMI Assessment & Plan Heparin drip.plavix held Will continue lisinopril at 20 mg. Pt is allergic to statin. BB once euvolemic. strict bedrest. Npo. Cardiology consulted.   Hypertension Assessment & Plan Pt is started on iv lasix and home regimen of hydralazine./lisinopril/diltiazem held to allow room for diuretic therapy and we will restart with diltiazem once pt has stabilized on [place].  From bipap.   Mesenteric ischemia [facility]) Assessment & Plan Home regimen with plavix continued.    PAD (peripheral artery disease) ([place]) Assessment & Plan Pt continued on her plavix.  Esophageal stenosis Assessment & Plan Pt also has h/o esophageal stricture and stenosis and GERD and is NPO  and has g/tube because of this  And only takes meds by mouth per pt.  Continue home regimen with tube feeding once gi evaluated and puts feeding tube back  Diabetes mellitus [facility]) Assessment & Plan Home regimen held to prevent hypoglycemia.  Cont ssi and accucheck and a1c.    Anemia Assessment & Plan Pt has iron def anemia chronically and is on Plavix because of PAD. long standing anemia is contributing to her CHF with increased demand. Anemia panel/type and screen/iv ppi/ cont supplementation of iron/    DVT prophylaxis: heparin Code Status: full code Family Communication: None Disposition Plan: TBD Consults called: None. Admission status: Inpatient  Para Skeans MD [facility] If 7PM-7AM, please contact night-coverage [email or web address] Password [facility]  11/23/2019, 1:31 PM

## 2019-11-23 NOTE — Assessment & Plan Note (Signed)
Pt is started on iv lasix and home regimen of hydralazine./lisinopril/diltiazem held to allow room for diuretic therapy and we will restart with diltiazem once pt has stabilized on .  From bipap.

## 2019-11-23 NOTE — Assessment & Plan Note (Signed)
Home regimen with plavix continued.

## 2019-11-23 NOTE — Progress Notes (Signed)
RT assisted with patient transport from ER to ICU, while patient on [id number], with no complications.

## 2019-11-24 ENCOUNTER — Encounter (INDEPENDENT_AMBULATORY_CARE_PROVIDER_SITE_OTHER): Payer: Self-pay

## 2019-11-24 ENCOUNTER — Encounter (INDEPENDENT_AMBULATORY_CARE_PROVIDER_SITE_OTHER): Payer: Medicare Other

## 2019-11-24 ENCOUNTER — Ambulatory Visit (INDEPENDENT_AMBULATORY_CARE_PROVIDER_SITE_OTHER): Payer: Medicare Other | Admitting: Vascular Surgery

## 2019-11-24 ENCOUNTER — Inpatient Hospital Stay
Admit: 2019-11-24 | Discharge: 2019-11-24 | Disposition: A | Discharge status: Home / Self Care | Payer: Medicare Other | Attending: Internal Medicine | Admitting: Internal Medicine

## 2019-11-24 DIAGNOSIS — K222 Esophageal obstruction: Secondary | ICD-10-CM

## 2019-11-24 DIAGNOSIS — I5043 Acute on chronic combined systolic (congestive) and diastolic (congestive) heart failure: Secondary | ICD-10-CM

## 2019-11-24 DIAGNOSIS — I10 Essential (primary) hypertension: Secondary | ICD-10-CM

## 2019-11-24 LAB — CBC WITH DIFFERENTIAL/PLATELET
Abs Immature Granulocytes: 0.02 10*3/uL (ref 0.00–0.07)
Basophils Absolute: 0 10*3/uL (ref 0.0–0.1)
Basophils Relative: 1 %
Eosinophils Absolute: 0.1 10*3/uL (ref 0.0–0.5)
Eosinophils Relative: 2 %
HCT: 27.5 % — ABNORMAL LOW (ref 36.0–46.0)
Hemoglobin: 8.7 g/dL — ABNORMAL LOW (ref 12.0–15.0)
Immature Granulocytes: 0 %
Lymphocytes Relative: 11 %
Lymphs Abs: 0.7 10*3/uL (ref 0.7–4.0)
MCH: 27.4 pg (ref 26.0–34.0)
MCHC: 31.6 g/dL (ref 30.0–36.0)
MCV: 86.5 fL (ref 80.0–100.0)
Monocytes Absolute: 0.4 10*3/uL (ref 0.1–1.0)
Monocytes Relative: 7 %
Neutro Abs: 4.9 10*3/uL (ref 1.7–7.7)
Neutrophils Relative %: 79 %
Platelets: 306 10*3/uL (ref 150–400)
RBC: 3.18 MIL/uL — ABNORMAL LOW (ref 3.87–5.11)
RDW: 16.2 % — ABNORMAL HIGH (ref 11.5–15.5)
WBC: 6.2 10*3/uL (ref 4.0–10.5)
nRBC: 0 % (ref 0.0–0.2)

## 2019-11-24 LAB — TROPONIN I (HIGH SENSITIVITY): Troponin I (High Sensitivity): 411 ng/L (ref <18)

## 2019-11-24 LAB — COMPREHENSIVE METABOLIC PANEL
ALT: 28 U/L (ref 0–44)
AST: 24 U/L (ref 15–41)
Albumin: 2.9 g/dL — ABNORMAL LOW (ref 3.5–5.0)
Alkaline Phosphatase: 74 U/L (ref 38–126)
Anion gap: 10 (ref 5–15)
BUN: 43 mg/dL — ABNORMAL HIGH (ref 8–23)
CO2: 28 mmol/L (ref 22–32)
Calcium: 8.6 mg/dL — ABNORMAL LOW (ref 8.9–10.3)
Chloride: 108 mmol/L (ref 98–111)
Creatinine, Ser: 0.99 mg/dL (ref 0.44–1.00)
GFR calc Af Amer: 59 mL/min — ABNORMAL LOW (ref >60)
GFR calc non Af Amer: 51 mL/min — ABNORMAL LOW (ref >60)
Glucose, Bld: 172 mg/dL — ABNORMAL HIGH (ref 70–99)
Potassium: 4.1 mmol/L (ref 3.5–5.1)
Sodium: 146 mmol/L — ABNORMAL HIGH (ref 135–145)
Total Bilirubin: 0.6 mg/dL (ref 0.3–1.2)
Total Protein: 5.8 g/dL — ABNORMAL LOW (ref 6.5–8.1)

## 2019-11-24 LAB — FERRITIN: Ferritin: 30 ng/mL (ref 11–307)

## 2019-11-24 LAB — GLUCOSE, CAPILLARY
Glucose-Capillary: 130 mg/dL — ABNORMAL HIGH (ref 70–99)
Glucose-Capillary: 183 mg/dL — ABNORMAL HIGH (ref 70–99)

## 2019-11-24 LAB — MAGNESIUM: Magnesium: 2.3 mg/dL (ref 1.7–2.4)

## 2019-11-24 LAB — ECHOCARDIOGRAM COMPLETE
Height: 63 in
Weight: 1918.88 oz

## 2019-11-24 LAB — HEPARIN LEVEL (UNFRACTIONATED): Heparin Unfractionated: 0.5 IU/mL (ref 0.30–0.70)

## 2019-11-24 MED ORDER — PROCHLORPERAZINE EDISYLATE 10 MG/2ML IJ SOLN
10.0000 mg | Freq: Once | INTRAMUSCULAR | Status: AC
  Administered 2019-11-24: 10 mg via INTRAVENOUS
  Filled 2019-11-24 (×2): qty 2

## 2019-11-24 MED ORDER — ENOXAPARIN SODIUM 40 MG/0.4ML ~~LOC~~ SOLN
40.0000 mg | SUBCUTANEOUS | Status: DC
  Administered 2019-11-24 – 2019-11-25 (×2): 40 mg via SUBCUTANEOUS
  Filled 2019-11-24 (×2): qty 0.4

## 2019-11-24 MED ORDER — ENOXAPARIN SODIUM 40 MG/0.4ML ~~LOC~~ SOLN: 40.0000 mg | SUBCUTANEOUS | Status: DC

## 2019-11-24 MED ORDER — FUROSEMIDE 10 MG/ML IJ SOLN
40.0000 mg | Freq: Two times a day (BID) | INTRAMUSCULAR | Status: DC
  Administered 2019-11-24 – 2019-11-26 (×5): 40 mg via INTRAVENOUS
  Filled 2019-11-24 (×5): qty 4

## 2019-11-24 MED ORDER — LOPERAMIDE HCL 2 MG PO CAPS
2.0000 mg | ORAL_CAPSULE | Freq: Once | ORAL | Status: AC
  Administered 2019-11-25: 2 mg via ORAL
  Filled 2019-11-24: qty 1

## 2019-11-24 MED ORDER — ONDANSETRON HCL 4 MG/2ML IJ SOLN: 4.0000 mg | Freq: Four times a day (QID) | INTRAMUSCULAR | Status: DC | PRN

## 2019-11-24 MED ORDER — ENOXAPARIN SODIUM 30 MG/0.3ML ~~LOC~~ SOLN: 30.0000 mg | SUBCUTANEOUS | Status: DC

## 2019-11-24 MED ORDER — PHENOL 1.4 % MT LIQD
1.0000 | OROMUCOSAL | Status: DC | PRN
  Filled 2019-11-24: qty 177

## 2019-11-24 MED ORDER — HEPARIN BOLUS VIA INFUSION
1600.0000 [IU] | Freq: Once | INTRAVENOUS | Status: AC
  Administered 2019-11-24: 1600 [IU] via INTRAVENOUS
  Filled 2019-11-24: qty 1600

## 2019-11-24 NOTE — Progress Notes (Signed)
1120 Report called to [place] on 2A. Transferred via bed.

## 2019-11-24 NOTE — Progress Notes (Signed)
*  PRELIMINARY RESULTS* Echocardiogram 2D Echocardiogram has been performed.  [name] 11/24/2019, 9:38 AM

## 2019-11-24 NOTE — Progress Notes (Signed)
Very cute sprie and completely oriented 86 year old female.

## 2019-11-24 NOTE — Progress Notes (Signed)
[facility] Encounter Note  Patient: [name] Day / Admit Date: 11/23/2019 / Date of Encounter: 11/24/2019, 12:27 PM   Subjective: Patient feeling significantly improved since admission.  Breathing is much better today.  Patient good heart rate control at 80 bpm.  Blood pressure improved.  The patient was has had continued elevation of troponin to 411.  Hemoglobin is stable and lungs are continuing to have basilar rales but improved.  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 secondary to anemia aortic stenosis and chronic kidney disease  Review of Systems: Positive for: Shortness of breath Negative for: Vision change, hearing change, syncope, dizziness, nausea, vomiting,diarrhea, bloody stool, stomach pain, cough, congestion, diaphoresis, urinary frequency, urinary pain,skin lesions, skin rashes Others previously listed  Objective: Telemetry: Normal sinus rhythm Physical Exam: Blood pressure (!) 125/53, pulse 80, temperature 98.5 F (36.9 C), temperature source Oral, resp. rate 18, height 5\' 3"  (1.6 m), weight 54.4 kg, SpO2 100 %. Body mass index is 21.24 kg/m. General: Well developed, well nourished, in no acute distress. Head: Normocephalic, atraumatic, sclera non-icteric, no xanthomas, nares are without discharge. Neck: No apparent masses Lungs: Normal respirations with no wheezes, no rhonchi, basilar rales , some crackles   Heart: Regular rate and rhythm, normal S1 S2, 3+ aortic murmur, no rub, no gallop, PMI is normal size and placement, carotid upstroke normal without bruit, jugular venous pressure normal Abdomen: Soft, non-tender, non-distended with normoactive bowel sounds. No hepatosplenomegaly. Abdominal aorta is normal size without bruit Extremities: Trace edema, no clubbing, no cyanosis, no ulcers,  Peripheral: 2+ radial, 2+ femoral, 2+ dorsal pedal pulses Neuro: Alert and oriented.  Moves all extremities spontaneously. Psych:  Responds to questions appropriately with a normal affect.   Intake/Output Summary (Last 24 hours) at 11/24/2019 1227 Last data filed at 11/24/2019 1100 Gross per 24 hour  Intake 1163.41 ml  Output 701 ml  Net 462.41 ml    Inpatient Medications:  . allopurinol  100 mg Per Tube Daily  . ascorbic acid  250 mg Per Tube BID  . chlorhexidine  15 mL Mouth Rinse BID  . Chlorhexidine Gluconate Cloth  6 each Topical Daily  . clopidogrel  75 mg Per Tube Daily  . [START ON 11/25/2019] enoxaparin (LOVENOX) injection  40 mg Subcutaneous Q24H  . feeding supplement (JEVITY 1.5 CAL/FIBER)  237 mL Per Tube QID  . feeding supplement (PRO-STAT SUGAR FREE 64)  30 mL Per Tube Daily  . free water  30 mL Per Tube Q4H  . furosemide  40 mg Intravenous Q12H  . lisinopril  20 mg Oral Daily  . mouth rinse  15 mL Mouth Rinse q12n4p  . metoprolol tartrate  25 mg Oral BID  . multivitamin  15 mL Per Tube Daily  . pantoprazole sodium  40 mg Per Tube BID  . rOPINIRole  2 mg Per Tube BID  . sodium chloride flush  3 mL Intravenous Q12H  . sodium chloride flush  3 mL Intravenous Q12H   Infusions:  . sodium chloride      Labs: Recent Labs    11/23/19 1023 11/24/19 0433  NA 142 146*  K 4.3 4.1  CL 107 108  CO2 24 28  GLUCOSE 206* 172*  BUN 39* 43*  CREATININE 0.85 0.99  CALCIUM 9.2 8.6*   Recent Labs    11/24/19 0433  AST 24  ALT 28  ALKPHOS 74  BILITOT 0.6  PROT 5.8*  ALBUMIN 2.9*   Recent Labs    11/23/19 1023 11/24/19 0433  WBC 9.1 6.2  NEUTROABS 7.5 4.9  HGB 9.8* 8.7*  HCT 32.0* 27.5*  MCV 86.5 86.5  PLT 384 306   No results for input(s): CKTOTAL, CKMB, TROPONINI in the last 72 hours. Invalid input(s): POCBNP Recent Labs    11/23/19 1530  HGBA1C 6.9*     Weights: Filed Weights   11/23/19 [id number] 11/23/19 1501 11/24/19 0500  Weight: 54 kg 54.5 kg 54.4 kg     Radiology/Studies:  DG Chest 2 View  Result Date: 11/23/2019 CLINICAL  DATA:  Acute hypoxemic EXAM: CHEST - 2 VIEW COMPARISON:  05/21/2019 FINDINGS: Cardiomegaly with Awanda Mink lines, airway cuffing, and small pleural effusions. No pneumothorax. IMPRESSION: CHF. Electronically Signed   By: Monte Fantasia M.D.   On: 11/23/2019 10:39     Assessment and Recommendation  86 y.o. female with known moderate aortic valve stenosis anemia chronic kidney disease with acute on chronic mixed systolic diastolic dysfunction congestive heart failure with elevated troponin consistent with demand ischemia rather than acute coronary syndrome improved with appropriate medication management 1.  Continue metoprolol lisinopril combination for acute on chronic systolic diastolic dysfunction congestive heart failure 2.  Continue intravenous Lasix and switch to oral Lasix tomorrow 3.  Okay for discontinuation of heparin at this time due to demand ischemia rather than acute coronary syndrome 4.  Begin ambulation and follow-up for improvements of symptoms and possible discharge home tomorrow if ambulating well with medication management adjustments  Signed, Serafina Royals M.D. FACC

## 2019-11-24 NOTE — Consult Note (Signed)
[place] for Heparin Indication: chest pain/ACS  Allergies  Allergen Reactions  . Gabapentin Swelling  . Lipitor [Atorvastatin] Other (See Comments)    Muscle aches  . Mevacor [Lovastatin] Other (See Comments)    Muscle aches  . Milk-Related Compounds Other (See Comments)    Large quantities cause headaches   . Septra [Sulfamethoxazole-Trimethoprim] Other (See Comments)    Unknown  . Statins Other (See Comments)    Muscle pain  . Sulfur Diarrhea and Nausea And Vomiting  . Zocor [Simvastatin] Other (See Comments)    Muscle aches    Patient Measurements: Height: 5\' 3"  (160 cm) Weight: 120 lb 2.4 oz (54.5 kg) IBW/kg (Calculated) : 52.4 Heparin Dosing Weight: 54 kg  Vital Signs: Temp: 98.8 F (37.1 C) (02/23 0000) Temp Source: Oral (02/23 0000) BP: 119/65 (02/23 0400) Pulse Rate: 84 (02/23 0400)  Labs: Recent Labs    11/23/19 1023 11/23/19 1023 11/23/19 1530 11/23/19 2251 11/24/19 0101  HGB 9.8*  --   --   --   --   HCT 32.0*  --   --   --   --   PLT 384  --   --   --   --   APTT  --   --  27  --   --   LABPROT  --   --  12.9  --   --   INR  --   --  1.0  --   --   HEPARINUNFRC  --   --   --  0.18*  --   CREATININE 0.85  --   --   --   --   TROPONINIHS 213*   < > 342* 438* 411*   < > = values in this interval not displayed.    Estimated Creatinine Clearance: 37.8 mL/min (by C-G formula based on SCr of 0.85 mg/dL).   Medical History: Past Medical History:  Diagnosis Date  . Anemia    IRON INFUSIONS  . Aortic valve sclerosis   . Arrhythmia   . Arthritis    osteoarthritis  . Basal cell carcinoma of skin   . Brain tumor ([place])   . Brain tumor ([place])   . Cervical spine disease   . Diabetes mellitus without complication ([place])   . Dysrhythmia    sinus arrhythmia  . Esophageal stricture    severe, led to feeding tube placement in Sept 2019  . Esophageal ulcer without bleeding   . Gastrostomy tube dependent ([place])     DOES NOT EAT OR DRINK   . GERD (gastroesophageal reflux disease)   . History of kidney stones   . Hyperlipemia   . Hypertension   . Kidney stones   . Leaky heart valve   . Meningioma ([place])   . 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 Pulmonary hypertension ([place])   . RLS (restless legs syndrome)   . Vertigo     Medications:  Medications Prior to Admission  Medication Sig Dispense Refill Last Dose  . acetaminophen (TYLENOL) 160 MG/5ML solution Place 20.3 mLs (650 mg total) into feeding tube every 6 (six) hours as needed for mild pain. 120 mL 0 Unknown at PRN  . albuterol (VENTOLIN HFA) 108 (90 Base) MCG/ACT inhaler Inhale 2 puffs into the lungs every 4 (four) hours as needed for shortness of breath or wheezing.   Unknown at PRN  . allopurinol (ZYLOPRIM) 100 MG tablet Place 100 mg into feeding tube daily.  24+ hours at Unknown  . Amino Acids-Protein Hydrolys (FEEDING SUPPLEMENT, PRO-STAT SUGAR FREE 64,) LIQD Place 30 mLs into feeding tube daily. 450 mL 1   . clopidogrel (PLAVIX) 75 MG tablet TAKE 1 TABLET BY MOUTH ONCE DAILY (Patient taking differently: Place 75 mg into feeding tube daily. ) 30 tablet 2 24+ hours at Unknown  . diltiazem (CARDIZEM) 120 MG tablet Take 120 mg by mouth daily.    24+ hours at Unknown  . furosemide (LASIX) 10 MG/ML solution Place 60 mg into feeding tube daily.    Past Week at Unknown  . glipiZIDE (GLUCOTROL) 5 MG tablet Place 1 tablet (5 mg total) into feeding tube daily before breakfast.   24+ hours at Unknown  . hydrALAZINE (APRESOLINE) 50 MG tablet Place 1 tablet (50 mg total) into feeding tube 2 (two) times daily. (Patient taking differently: Place 25 mg into feeding tube 2 (two) times daily. )   24+ hours at Unknown  . Hypromellose (GENTEAL MILD) 0.2 % SOLN Place 1 drop into both eyes 3 (three) times daily as needed (dry eyes).    Unknown at PRN  . lisinopril (ZESTRIL) 40 MG tablet Take 20 mg by mouth daily.    24+ hours at Unknown  . loperamide (IMODIUM)  1 MG/5ML solution Take 3 mg by mouth as needed for diarrhea or loose stools.   Unknown at PRN  . meclizine (ANTIVERT) 25 MG tablet Place 25 mg into feeding tube as needed for dizziness.    Unknown at PRN  . Multiple Vitamin (MULTIVITAMIN) LIQD Place 15 mLs into feeding tube daily. 450 mL 1   . Nutritional Supplements (FEEDING SUPPLEMENT, JEVITY 1.5 CAL/FIBER,) LIQD Place 237 mLs into feeding tube 4 (four) times daily. 237 mL 90   . pantoprazole sodium (PROTONIX) 40 mg/20 mL PACK Place 20 mLs (40 mg total) into feeding tube 2 (two) times daily. 30 each 1 24+ hours at Unknown  . rOPINIRole (REQUIP) 2 MG tablet Place 1 tablet (2 mg total) into feeding tube 2 (two) times daily. 60 tablet 1 24+ hours at Unknown  . vitamin C (VITAMIN C) 250 MG tablet Place 1 tablet (250 mg total) into feeding tube 2 (two) times daily. 60 tablet 1   . Water For Irrigation, Sterile (FREE WATER) SOLN Place 30 mLs into feeding tube every 4 (four) hours.      Scheduled:  . allopurinol  100 mg Per Tube Daily  . ascorbic acid  250 mg Per Tube BID  . chlorhexidine  15 mL Mouth Rinse BID  . Chlorhexidine Gluconate Cloth  6 each Topical Daily  . clopidogrel  75 mg Per Tube Daily  . feeding supplement (JEVITY 1.5 CAL/FIBER)  237 mL Per Tube QID  . feeding supplement (PRO-STAT SUGAR FREE 64)  30 mL Per Tube Daily  . free water  30 mL Per Tube Q4H  . heparin  1,600 Units Intravenous Once  . lisinopril  20 mg Oral Daily  . mouth rinse  15 mL Mouth Rinse q12n4p  . metoprolol tartrate  25 mg Oral BID  . multivitamin  15 mL Per Tube Daily  . pantoprazole sodium  40 mg Per Tube BID  . rOPINIRole  2 mg Per Tube BID  . sodium chloride flush  3 mL Intravenous Q12H  . sodium chloride flush  3 mL Intravenous Q12H   Infusions:  . sodium chloride    . heparin 650 Units/hr (11/23/19 1900)   PRN: sodium chloride, acetaminophen, loperamide HCl,  meclizine, polyvinyl alcohol, sodium chloride flush Anti-infectives (From admission,  onward)   None      Assessment: Pharmacy consulted to start heparin for ACS. No bolus per MD. Pt is on plavix.  Goal of Therapy:  Heparin level 0.3-0.7 units/ml Monitor platelets by anticoagulation protocol: Yes   Plan:  02/22 @ 2200 HL 0.18 (f/u was dated for the following day and was missed until this am) subtherapeutic, will rebolus w/ heparin 1600 units IV x 1 and increase rate to 750 units/hr and recheck HL at 1300 will continue to monitor.  Tobie Lords, PharmD, BCPS 11/24/2019,4:49 AM

## 2019-11-24 NOTE — Progress Notes (Signed)
Initial Nutrition Assessment  DOCUMENTATION CODES:   Severe malnutrition in context of chronic illness  INTERVENTION:   Jevity 1.5- 4 cans daily + Prostat 18ml daily via tube- Flush with 32ml before and after each feeding   Free water flushes 9ml q 4 hrs  Regimen provides 1520kcal/day, 75g/day protein, 1390ml of free water daily, 21g/day of fiber   MVI liquid daily via tube   NUTRITION DIAGNOSIS:   Severe Malnutrition related to dysphagia as evidenced by moderate fat depletion, severe muscle depletion.  GOAL:   Patient will meet greater than or equal to 90% of their needs  MONITOR:   Labs, Weight trends, Skin, I & O's, TF tolerance  REASON FOR ASSESSMENT:   Diagnosis    ASSESSMENT:   86 y.o. female with a known history of anemia, aortic valve sclerosis, arrhythmia, CHF, DM, basal cell cancer of skin, esophageal ulcer without bleeding, hyperlipidemia, hypertension, pulmonary hypertension, meningioma, difficulty swallowing s/p esophageal stretching at [facility] but it reoccured within a few months requiring IR G-tube placement 9/11 who is admitted with SOB and G-tube fallen out  Pt s/p 64F Foley balloon-retention catheter 2/22  RD familiar with this patient from previous admits. Pt tolerating home tube feed regimen well. Per chart, pt is weight stable pta. Continue home tube feed regimen. It appears from previous RD notes, that pt has been eating some food by mouth and drinking some Ensure at home.   Per previous RD note: Patient reports that she has been eating more foods orally.  Reports that she can eat toast (broiled in oven) with lots of butter and gravy, soups no pieces with dissolved oyster crackers, pinto beans extra soupy and boiled egg, chopped fine with ranch dressing.  Reports that she eats very slow, small bites all through the day.  Has been taking 3.5 to 4 cartons of jevity 1.5 daily.  Takes 3.5 on days she eats more orally.  Has also been giving 5ml  prosource via tube (Dr. Ginette Pitman recently increased).    Medications reviewed and include: allopurinol, vitamin C, plavix, lasix, MVI, protonix  Labs reviewed: Na 146(H), Mg 2.3 wnl Hgb 8.7(L), Hct 27.5(L) cbgs- 130, 183 x 24 hrs AIC 6.9(H)- 2/22  NUTRITION - FOCUSED PHYSICAL EXAM:    Most Recent Value  Orbital Region  Moderate depletion  Upper Arm Region  Mild depletion  Thoracic and Lumbar Region  Moderate depletion  Buccal Region  Moderate depletion  Temple Region  Moderate depletion  Clavicle Bone Region  Severe depletion  Clavicle and Acromion Bone Region  Severe depletion  Scapular Bone Region  Severe depletion  Dorsal Hand  Severe depletion  Patellar Region  Severe depletion  Anterior Thigh Region  Severe depletion  Posterior Calf Region  Severe depletion  Edema (RD Assessment)  None  Hair  Reviewed  Eyes  Reviewed  Mouth  Reviewed  Skin  Reviewed  Nails  Reviewed     Diet Order:   Diet Order            Diet NPO time specified  Diet effective now             EDUCATION NEEDS:   No education needs have been identified at this time  Skin:  Skin Assessment: Reviewed RN Assessment  Last BM:  2/23- type 7  Height:   Ht Readings from Last 1 Encounters:  11/23/19 5\' 3"  (1.6 m)    Weight:   Wt Readings from Last 1 Encounters:  11/24/19 54.4 kg  Ideal Body Weight:  52.3 kg  BMI:  Body mass index is 21.24 kg/m.  Estimated Nutritional Needs:   Kcal:  1300-1500kcal/day  Protein:  65-75g/day  Fluid:  >1.4L/day  Koleen Distance MS, RD, LDN Contact information available in Amion
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
## 2019-11-24 NOTE — TOC Initial Note (Addendum)
Transition of Care [facility]) - Initial/Assessment Note    Patient Details  Name: [name] MRN: [id number] Date of Birth: Apr 09, 1929  Transition of Care [facility]) CM/SW Contact:    Magnus Ivan, [place] Phone Number: 11/24/2019, 10:41 AM  Clinical Narrative:                CSW met with patient at bedside. Daughter [name] Bandy) came towards end of CSW's conversation with patient. Explained CSW role. Patient lives alone and said her daughter checks on her and provides support daily. Patient said she has equipment that belonged to her husband- including a wheelchair, 2 or 3 walkers, hospital bed, ramp, shower chair, and a Pella. Patient denied equipment needs at home at this time. Patient stated she uses Holiday representative in [place] and denied issues with obtaining her medicines. Patient said her daughter helps her to get her prescriptions filled. PCP is Dr. Ginette Pitman, patient also listed several specialists that she sees. CSW will submit request for Nurse Secretary to schedule PCP follow up appointment within 3-5 days of discharge. Patient reported her daughter drives her to appointments and patient drives herself to church and errands that are close by her home. Patient had Home Health services through [facility] in the past and said she would like to use them again if these services are recommended at discharge. Patient said she also has a Marine scientist through [facility] who checks on her as needed via phone or in person. Patient denied any needs at this time and she and her daughter were encouraged to reach out if needs arise. CSW will continue to follow up as needed.   Expected Discharge Plan: Home/Self Care Barriers to Discharge: Continued Medical Work up   Patient Goals and CMS Choice        Expected Discharge Plan and Services Expected Discharge Plan: Home/Self Care       Living arrangements for the past 2 months: Single Family Home                                      Prior Living  Arrangements/Services Living arrangements for the past 2 months: Single Family Home Lives with:: Self Patient language and need for interpreter reviewed:: Yes Do you feel safe going back to the place where you live?: Yes      Need for Family Participation in Patient Care: Yes (Comment) Care giver support system in place?: Yes (comment) Current home services: DME, Other (comment)(RN through [facility] who checks in via phone and home visits PRN) Criminal Activity/Legal Involvement Pertinent to Current Situation/Hospitalization: No - Comment as needed  Activities of Daily Living Home Assistive Devices/Equipment: Feeding equipment, Enteral Feeding Supplies ADL Screening (condition at time of admission) Patient's cognitive ability adequate to safely complete daily activities?: Yes Is the patient deaf or have difficulty hearing?: No Does the patient have difficulty seeing, even when wearing glasses/contacts?: No Does the patient have difficulty concentrating, remembering, or making decisions?: No Patient able to express need for assistance with ADLs?: Yes Does the patient have difficulty dressing or bathing?: Yes Independently performs ADLs?: No Does the patient have difficulty walking or climbing stairs?: Yes Weakness of Legs: Both Weakness of Arms/Hands: Both  Permission Sought/Granted   Permission granted to share information with : Yes, Verbal Permission Granted  Share Information with NAME: [name]     Permission granted to share info w Relationship: Daughter  Emotional Assessment Appearance:: Appears stated age Attitude/Demeanor/Rapport: Engaged, Gracious Affect (typically observed): Calm, Appropriate Orientation: : Oriented to Self, Oriented to Place, Oriented to  Time, Oriented to Situation Alcohol / Substance Use: Not Applicable Psych Involvement: No (comment)  Admission diagnosis:  CHF exacerbation ([place]) [I50.9] Malfunction of gastrostomy tube (HCC)  [K94.23] Acute respiratory failure with hypoxia (HCC) [J96.01] Acute on chronic congestive heart failure, unspecified heart failure type ([place]) [I50.9] Patient Active Problem List   Diagnosis Date Noted  . Respiratory failure with hypoxia ([place]) 11/23/2019  . CHF exacerbation ([place]) 11/23/2019  . GERD (gastroesophageal reflux disease)   . Gastrostomy tube dependent ([place])   . Esophageal ulcer without bleeding   . Diabetes mellitus without complication ([place])   . Anemia   . Chest pain, atypical 03/18/2019  . Tricuspid regurgitation 02/13/2019  . Heart palpitations 02/12/2019  . Hyponatremia 06/23/2018  . Protein-calorie malnutrition, severe 06/12/2018  . Esophageal stricture 06/11/2018  . Weight loss 05/29/2018  . Dysphagia 05/29/2018  . Esophageal stenosis 05/05/2018  . Hyperlipemia 04/04/2018  . Itching 01/08/2018  . Medication monitoring encounter 01/08/2018  . Paronychia of toe 01/08/2018  . Esophagitis, CMV ([place]) 01/01/2018  . 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 Hypertension 04/30/2017  . Mesenteric ischemia ([place]) 03/01/2017  . Iron deficiency anemia 02/28/2017  . PAD (peripheral artery disease) ([place]) 02/05/2017  . Hyperlipidemia type II 04/04/2016  . Pulmonary hypertension ([place]) 01/04/2015  . Generalized OA 12/13/2014  . Leg edema 12/13/2014  . Aortic valve sclerosis 02/19/2014  . Diabetes mellitus ([place]) 12/25/2013  . Acute, but ill-defined, cerebrovascular disease 12/25/2013  . Benign essential hypertension 12/25/2013  . Unilateral primary osteoarthritis, unspecified knee 12/25/2013  . Personal history of other malignant neoplasm of skin 05/28/2012   PCP:  [name], MD Pharmacy:   [facility] [place], [place] - [address] [place] [address] Phone: [phone] Fax: [phone]  [id number] [address], [place] [address] [place] [address] Phone: [phone] Fax: [phone]     Social Determinants of Health (SDOH) Interventions    Readmission Risk Interventions Readmission Risk Prevention Plan 11/24/2019  Transportation Screening Complete  PCP or Specialist Appt within 3-5 Days Complete  HRI or Home Care Consult Complete  Medication Review (RN Care Manager) Complete  Some recent data might be hidden

## 2019-11-25 ENCOUNTER — Inpatient Hospital Stay: Payer: Medicare Other

## 2019-11-25 LAB — CBC WITH DIFFERENTIAL/PLATELET
Abs Immature Granulocytes: 0.03 10*3/uL (ref 0.00–0.07)
Basophils Absolute: 0.1 10*3/uL (ref 0.0–0.1)
Basophils Relative: 1 %
Eosinophils Absolute: 0.3 10*3/uL (ref 0.0–0.5)
Eosinophils Relative: 4 %
HCT: 28.1 % — ABNORMAL LOW (ref 36.0–46.0)
Hemoglobin: 8.5 g/dL — ABNORMAL LOW (ref 12.0–15.0)
Immature Granulocytes: 0 %
Lymphocytes Relative: 10 %
Lymphs Abs: 0.8 10*3/uL (ref 0.7–4.0)
MCH: 26.7 pg (ref 26.0–34.0)
MCHC: 30.2 g/dL (ref 30.0–36.0)
MCV: 88.4 fL (ref 80.0–100.0)
Monocytes Absolute: 0.5 10*3/uL (ref 0.1–1.0)
Monocytes Relative: 6 %
Neutro Abs: 6.6 10*3/uL (ref 1.7–7.7)
Neutrophils Relative %: 79 %
Platelets: 300 10*3/uL (ref 150–400)
RBC: 3.18 MIL/uL — ABNORMAL LOW (ref 3.87–5.11)
RDW: 15.9 % — ABNORMAL HIGH (ref 11.5–15.5)
WBC: 8.3 10*3/uL (ref 4.0–10.5)
nRBC: 0 % (ref 0.0–0.2)

## 2019-11-25 LAB — BASIC METABOLIC PANEL
Anion gap: 8 (ref 5–15)
BUN: 52 mg/dL — ABNORMAL HIGH (ref 8–23)
CO2: 30 mmol/L (ref 22–32)
Calcium: 8.6 mg/dL — ABNORMAL LOW (ref 8.9–10.3)
Chloride: 106 mmol/L (ref 98–111)
Creatinine, Ser: 1.01 mg/dL — ABNORMAL HIGH (ref 0.44–1.00)
GFR calc Af Amer: 58 mL/min — ABNORMAL LOW (ref >60)
GFR calc non Af Amer: 50 mL/min — ABNORMAL LOW (ref >60)
Glucose, Bld: 167 mg/dL — ABNORMAL HIGH (ref 70–99)
Potassium: 3.9 mmol/L (ref 3.5–5.1)
Sodium: 144 mmol/L (ref 135–145)

## 2019-11-25 LAB — MAGNESIUM: Magnesium: 2.5 mg/dL — ABNORMAL HIGH (ref 1.7–2.4)

## 2019-11-25 LAB — FERRITIN: Ferritin: 26 ng/mL (ref 11–307)

## 2019-11-25 LAB — GLUCOSE, CAPILLARY: Glucose-Capillary: 158 mg/dL — ABNORMAL HIGH (ref 70–99)

## 2019-11-25 LAB — TROPONIN I (HIGH SENSITIVITY): Troponin I (High Sensitivity): 251 ng/L (ref <18)

## 2019-11-25 MED ORDER — CEFDINIR 300 MG PO CAPS
300.0000 mg | ORAL_CAPSULE | Freq: Two times a day (BID) | ORAL | Status: DC
  Administered 2019-11-25 – 2019-11-26 (×2): 300 mg via ORAL
  Filled 2019-11-25 (×3): qty 1
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
## 2019-11-26 LAB — BASIC METABOLIC PANEL
Anion gap: 12 (ref 5–15)
BUN: 51 mg/dL — ABNORMAL HIGH (ref 8–23)
CO2: 31 mmol/L (ref 22–32)
Calcium: 8.7 mg/dL — ABNORMAL LOW (ref 8.9–10.3)
Chloride: 102 mmol/L (ref 98–111)
Creatinine, Ser: 1.06 mg/dL — ABNORMAL HIGH (ref 0.44–1.00)
GFR calc Af Amer: 54 mL/min — ABNORMAL LOW (ref >60)
GFR calc non Af Amer: 47 mL/min — ABNORMAL LOW (ref >60)
Glucose, Bld: 157 mg/dL — ABNORMAL HIGH (ref 70–99)
Potassium: 3.4 mmol/L — ABNORMAL LOW (ref 3.5–5.1)
Sodium: 145 mmol/L (ref 135–145)

## 2019-11-26 LAB — MAGNESIUM: Magnesium: 2.3 mg/dL (ref 1.7–2.4)

## 2019-11-26 LAB — FERRITIN: Ferritin: 26 ng/mL (ref 11–307)

## 2019-11-26 LAB — GLUCOSE, CAPILLARY: Glucose-Capillary: 144 mg/dL — ABNORMAL HIGH (ref 70–99)

## 2019-11-26 MED ORDER — METOPROLOL TARTRATE 25 MG PO TABS: 25.0000 mg | ORAL_TABLET | Freq: Two times a day (BID) | ORAL | 0 refills | Status: DC

## 2019-11-26 MED ORDER — POTASSIUM CHLORIDE CRYS ER 20 MEQ PO TBCR
40.0000 meq | EXTENDED_RELEASE_TABLET | Freq: Once | ORAL | Status: AC
  Administered 2019-11-26: 40 meq via ORAL
  Filled 2019-11-26: qty 2

## 2019-11-26 MED ORDER — CEFDINIR 300 MG PO CAPS: 300.0000 mg | ORAL_CAPSULE | Freq: Two times a day (BID) | ORAL | 0 refills | Status: AC

## 2019-11-26 NOTE — Care Management Important Message (Signed)
Important Message  Patient Details  Name: [name] Day MRN: [id number] Date of Birth: May 31, 1929   Medicare Important Message Given:  No  Patient discharged prior to arrival to unit to deliver concurrent Medicare IM.     Dannette Barbara 11/26/2019, 12:59 PM

## 2019-11-26 NOTE — Progress Notes (Signed)
PT Cancellation Note  Patient Details Name: [name] Day MRN: [id number] DOB: 09-Aug-1929   Cancelled Treatment:    Reason Eval/Treat Not Completed: PT screened, no needs identified, will sign off. Pt dressed and ready for DC, reports she has been AMB to BR and back without any acute impairment, some weakness she reports 2/2 not having food for 3 days due to G tube malfunction. Pt AMB around unit with RW without any gait instability, endorses baseline mobility. Pt has not acute PT needs. Will signoff.   11:40 AM, 11/26/19 Etta Grandchild, PT, DPT Physical Therapist - [facility]  [phone] ([place])    [name] 11/26/2019, 11:38 AM

## 2019-11-26 NOTE — Progress Notes (Signed)
ReDS Pro not appropriate due to BMI <22.
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
## 2019-11-26 NOTE — Plan of Care (Signed)
VSS. NSR on tele. Denies any pain. Post PEG placement 11/25/19. Site c/d/I w/o any complications. Meds/tube feed given w/o difficulty. Assessment as documented. Falls precautions maintained. Up w/ standby assist and walker. Pt independent at baseline. POC reviewed. Pt hopes to go home today. Sepsis screen negative @ 238.  Problem: Education: Goal: Knowledge of General Education information will improve Description: Including pain rating scale, medication(s)/side effects and non-pharmacologic comfort measures Outcome: Progressing   Problem: Health Behavior/Discharge Planning: Goal: Ability to manage health-related needs will improve Outcome: Progressing   Problem: Clinical Measurements: Goal: Ability to maintain clinical measurements within normal limits will improve Outcome: Progressing Goal: Will remain free from infection Outcome: Progressing Goal: Diagnostic test results will improve Outcome: Progressing Goal: Respiratory complications will improve Outcome: Progressing Goal: Cardiovascular complication will be avoided Outcome: Progressing   Problem: Activity: Goal: Risk for activity intolerance will decrease Outcome: Progressing   Problem: Nutrition: Goal: Adequate nutrition will be maintained Outcome: Progressing   Problem: Coping: Goal: Level of anxiety will decrease Outcome: Progressing   Problem: Elimination: Goal: Will not experience complications related to bowel motility Outcome: Progressing Goal: Will not experience complications related to urinary retention Outcome: Progressing   Problem: Pain Managment: Goal: General experience of comfort will improve Outcome: Progressing   Problem: Safety: Goal: Ability to remain free from injury will improve Outcome: Progressing   Problem: Skin Integrity: Goal: Risk for impaired skin integrity will decrease Outcome: Progressing

## 2019-11-26 NOTE — Progress Notes (Signed)
Discharge instructions explained to pt and pts daughter/ verbalized an understanding/ iv and tele removed/ will transport off unit via wheelchair 

## 2019-11-26 NOTE — TOC Transition Note (Signed)
Transition of Care [facility]) - CM/SW Discharge Note   Patient Details  Name: BREONICA GAUNA Day MRN: [id number] Date of Birth: 1929-08-23  Transition of Care [facility] CM/SW Contact:  Victorino Dike, RN Phone Number: 11/26/2019, 10:09 AM   Clinical Narrative:     Patient to discharge home today.  No new needs identified on this admission.  High Risk assessment completed this admission. No further TOC needs at this time, please re-consult for new needs.     Final next level of care: Home/Self Care Barriers to Discharge: Barriers Resolved   Patient Goals and CMS Choice        Discharge Placement                       Discharge Plan and Services                                     Social Determinants of Health (SDOH) Interventions     Readmission Risk Interventions Readmission Risk Prevention Plan 11/24/2019  Transportation Screening Complete  PCP or Specialist Appt within 3-5 Days Complete  HRI or Home Care Consult Complete  Medication Review (RN Care Manager) Complete  Some recent data might be hidden

## 2019-11-26 NOTE — Progress Notes (Signed)
[facility] Encounter Note  Patient: [name] [name] / Admit Date: 11/23/2019 / Date of Encounter: 11/26/2019, 8:41 AM   Subjective: Patient feeling significantly improved since admission.  Breathing is much better today.  Patient good heart rate control at 80 bpm.  Blood pressure improved.  The patient was has had continued elevation of troponin to 411.  Hemoglobin is stable and lungs are new improve..  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 secondary to anemia aortic stenosis and chronic kidney disease Echocardiogram showing severe LV systolic dysfunction with valvular heart disease with ejection fraction of 20% G-tube placed without complication Review of Systems: Positive for: Shortness of breath Negative for: Vision change, hearing change, syncope, dizziness, nausea, vomiting,diarrhea, bloody stool, stomach pain, cough, congestion, diaphoresis, urinary frequency, urinary pain,skin lesions, skin rashes Others previously listed  Objective: Telemetry: Normal sinus rhythm Physical Exam: Blood pressure (!) 129/53, pulse 71, temperature (!) 97.4 F (36.3 C), temperature source Oral, resp. rate 17, height 5\' 3"  (1.6 m), weight 51.9 kg, SpO2 100 %. Body mass index is 20.28 kg/m. General: Well developed, well nourished, in no acute distress. Head: Normocephalic, atraumatic, sclera non-icteric, no xanthomas, nares are without discharge. Neck: No apparent masses Lungs: Normal respirations with no wheezes, no rhonchi, basilar rales , some crackles   Heart: Regular rate and rhythm, normal S1 S2, 3+ aortic murmur, no rub, no gallop, PMI is normal size and placement, carotid upstroke normal without bruit, jugular venous pressure normal Abdomen: Soft, non-tender, non-distended with normoactive bowel sounds. No hepatosplenomegaly. Abdominal aorta is normal size without bruit Extremities: Trace edema, no  clubbing, no cyanosis, no ulcers,  Peripheral: 2+ radial, 2+ femoral, 2+ dorsal pedal pulses Neuro: Alert and oriented. Moves all extremities spontaneously. Psych:  Responds to questions appropriately with a normal affect.   Intake/Output Summary (Last 24 hours) at 11/26/2019 0841 Last data filed at 11/26/2019 0336 Gross per 24 hour  Intake 524 ml  Output 1900 ml  Net -1376 ml    Inpatient Medications:  . allopurinol  100 mg Per Tube Daily  . ascorbic acid  250 mg Per Tube BID  . cefdinir  300 mg Oral Q12H  . chlorhexidine  15 mL Mouth Rinse BID  . Chlorhexidine Gluconate Cloth  6 each Topical Daily  . clopidogrel  75 mg Per Tube Daily  . enoxaparin (LOVENOX) injection  40 mg Subcutaneous Q24H  . feeding supplement (JEVITY 1.5 CAL/FIBER)  237 mL Per Tube QID  . feeding supplement (PRO-STAT SUGAR FREE 64)  30 mL Per Tube Daily  . free water  30 mL Per Tube Q4H  . furosemide  40 mg Intravenous Q12H  . lisinopril  20 mg Oral Daily  . mouth rinse  15 mL Mouth Rinse q12n4p  . metoprolol tartrate  25 mg Oral BID  . multivitamin  15 mL Per Tube Daily  . pantoprazole sodium  40 mg Per Tube BID  . rOPINIRole  2 mg Per Tube BID  . sodium chloride flush  3 mL Intravenous Q12H  . sodium chloride flush  3 mL Intravenous Q12H   Infusions:  . sodium chloride      Labs: Recent Labs    11/25/19 0644 11/26/19 0500  NA 144 145  K 3.9 3.4*  CL 106 102  CO2 30 31  GLUCOSE 167* 157*  BUN 52* 51*  CREATININE 1.01* 1.06*  CALCIUM 8.6* 8.7*  MG 2.5* 2.3  Recent Labs    11/24/19 0433  AST 24  ALT 28  ALKPHOS 74  BILITOT 0.6  PROT 5.8*  ALBUMIN 2.9*   Recent Labs    11/24/19 0433 11/25/19 0644  WBC 6.2 8.3  NEUTROABS 4.9 6.6  HGB 8.7* 8.5*  HCT 27.5* 28.1*  MCV 86.5 88.4  PLT 306 300   No results for input(s): CKTOTAL, CKMB, TROPONINI in the last 72 hours. Invalid input(s): POCBNP Recent Labs    11/23/19 1530  HGBA1C 6.9*     Weights: Filed Weights   11/24/19  0500 11/25/19 0307 11/26/19 0404  Weight: 54.4 kg 53.3 kg 51.9 kg     Radiology/Studies:  DG Chest 2 View  Result Date: 11/23/2019 CLINICAL DATA:  Acute hypoxemic EXAM: CHEST - 2 VIEW COMPARISON:  05/21/2019 FINDINGS: Cardiomegaly with Awanda Mink lines, airway cuffing, and small pleural effusions. No pneumothorax. IMPRESSION: CHF. Electronically Signed   By: Monte Fantasia M.D.   On: 11/23/2019 10:39   ECHOCARDIOGRAM COMPLETE  Result Date: 11/24/2019    ECHOCARDIOGRAM REPORT   Patient Name:   [name] Date of Exam: 11/24/2019 Medical Rec #:  [id number]        Height:       63.0 in Accession #:    [id number]       Weight:       119.9 lb Date of Birth:  [birth year]       BSA:          1.556 m Patient Age:    86 years         BP:           129/88 mmHg Patient Gender: F                HR:           99 bpm. Exam Location:  [facility] Procedure: 2D Echo, Cardiac Doppler and Color Doppler Indications:     CHF 428.0  History:         Patient has no prior history of Echocardiogram examinations.                  Pulmonary HTN; Risk Factors:Hypertension and Diabetes.  Sonographer:     Sherrie Sport RDCS (AE) Referring Phys:  [place] Diagnosing Phys: Serafina Royals MD IMPRESSIONS  1. Left ventricular ejection fraction, by estimation, is 20 to 25%. The left ventricle has severely decreased function. The left ventricle demonstrates global hypokinesis. The left ventricular internal cavity size was moderately to severely dilated. There is moderate left ventricular hypertrophy. Left ventricular diastolic parameters are consistent with Grade I diastolic dysfunction (impaired relaxation).  2. Right ventricular systolic function is mildly reduced. The right ventricular size is mildly enlarged. There is moderately elevated pulmonary artery systolic pressure.  3. Left atrial size was moderately dilated.  4. Right atrial size was moderately dilated.  5. Large pleural effusion in the left lateral region.  6. The mitral  valve is normal in structure and function. Severe mitral valve regurgitation.  7. Tricuspid valve regurgitation is moderate to severe.  8. The aortic valve is normal in structure and function. Aortic valve regurgitation is mild. FINDINGS  Left Ventricle: Left ventricular ejection fraction, by estimation, is 20 to 25%. The left ventricle has severely decreased function. The left ventricle demonstrates global hypokinesis. The left ventricular internal cavity size was moderately to severely  dilated. There is moderate left ventricular hypertrophy. Left ventricular diastolic parameters are consistent with Grade  I diastolic dysfunction (impaired relaxation). Right Ventricle: The right ventricular size is mildly enlarged. No increase in right ventricular wall thickness. Right ventricular systolic function is mildly reduced. There is moderately elevated pulmonary artery systolic pressure. The tricuspid regurgitant velocity is 3.22 m/s, and with an assumed right atrial pressure of 10 mmHg, the estimated right ventricular systolic pressure is [id number] mmHg. Left Atrium: Left atrial size was moderately dilated. Right Atrium: Right atrial size was moderately dilated. Pericardium: There is no evidence of pericardial effusion. Mitral Valve: The mitral valve is normal in structure and function. Severe mitral valve regurgitation. Tricuspid Valve: The tricuspid valve is normal in structure. Tricuspid valve regurgitation is moderate to severe. Aortic Valve: The aortic valve is normal in structure and function. Aortic valve regurgitation is mild. Aortic regurgitation PHT measures 385 msec. Aortic valve mean gradient measures 8.7 mmHg. Aortic valve peak gradient measures 15.1 mmHg. Aortic valve area, by VTI measures 0.78 cm. Pulmonic Valve: The pulmonic valve was normal in structure. Pulmonic valve regurgitation is mild. Aorta: The aortic root and ascending aorta are structurally normal, with no evidence of dilitation. IAS/Shunts: No  atrial level shunt detected by color flow Doppler. Additional Comments: There is a large pleural effusion in the left lateral region.  LEFT VENTRICLE PLAX 2D LVIDd:         6.23 cm      Diastology LVIDs:         5.44 cm      LV e' lateral:   3.37 cm/s LV PW:         0.96 cm      LV E/e' lateral: 33.8 LV IVS:        1.01 cm      LV e' medial:    3.37 cm/s LVOT diam:     2.00 cm      LV E/e' medial:  33.8 LV SV:         34 LV SV Index:   22 LVOT Area:     3.14 cm  LV Volumes (MOD) LV vol d, MOD A2C: 118.0 ml LV vol d, MOD A4C: 117.0 ml LV vol s, MOD A2C: 83.9 ml LV vol s, MOD A4C: 80.9 ml LV SV MOD A2C:     34.1 ml LV SV MOD A4C:     117.0 ml LV SV MOD BP:      35.5 ml RIGHT VENTRICLE RV Basal diam:  4.17 cm RV S prime:     9.36 cm/s TAPSE (M-mode): 3.0 cm LEFT ATRIUM             Index       RIGHT ATRIUM           Index LA diam:        5.00 cm 3.21 cm/m  RA Area:     17.60 cm LA Vol (A2C):   98.3 ml 63.18 ml/m RA Volume:   50.30 ml  32.33 ml/m LA Vol (A4C):   75.7 ml 48.65 ml/m LA Biplane Vol: 88.9 ml 57.14 ml/m  AORTIC VALVE                    PULMONIC VALVE AV Area (Vmax):    0.81 cm     PV Vmax:        0.64 m/s AV Area (Vmean):   0.76 cm     PV Peak grad:   1.6 mmHg AV Area (VTI):     0.78 cm     RVOT  Peak grad: 2 mmHg AV Vmax:           194.00 cm/s AV Vmean:          137.000 cm/s AV VTI:            0.436 m AV Peak Grad:      15.1 mmHg AV Mean Grad:      8.7 mmHg LVOT Vmax:         50.20 cm/s LVOT Vmean:        33.200 cm/s LVOT VTI:          0.108 m LVOT/AV VTI ratio: 0.25 AI PHT:            385 msec  AORTA Ao Root diam: 2.80 cm MITRAL VALVE                TRICUSPID VALVE MV Area (PHT): 2.66 cm     TR Peak grad:   41.5 mmHg MV Decel Time: 285 msec     TR Vmax:        322.00 cm/s MV E velocity: 114.00 cm/s MV A velocity: 69.80 cm/s   SHUNTS MV E/A ratio:  1.63         Systemic VTI:  0.11 m                             Systemic Diam: 2.00 cm Serafina Royals MD Electronically signed by Serafina Royals MD  Signature Date/Time: 11/24/2019/12:45:18 PM    Final      Assessment and Recommendation  86 y.o. female with known moderate aortic valve stenosis anemia chronic kidney disease with acute on chronic mixed systolic diastolic dysfunction congestive heart failure with elevated troponin consistent with demand ischemia rather than acute coronary syndrome improved with appropriate medication management.  Patient is now hemodynamically stable 1.  Continue metoprolol lisinopril combination for acute on chronic systolic diastolic dysfunction congestive heart failure which is significantly improved 2.  Continue oral Lasix at current dose 3.  No further cardiac diagnostics necessary at this time 4.  Begin ambulation and follow-up for improvements of symptoms and possible discharge home today if ambulating well with medication management adjustments  Signed, Serafina Royals M.D. FACC

## 2019-11-27 ENCOUNTER — Telehealth: Payer: Self-pay | Admitting: Family

## 2019-11-27 NOTE — Telephone Encounter (Signed)
Called and LVM for patient to confirm her New Patient [facility] appt we have scheduled after her recent hospital d/c for 3/2. Asked patient to call back to confirm as well as to follow up since her d/c.   Alyse Low, [place]

## 2019-11-30 ENCOUNTER — Inpatient Hospital Stay: Payer: Medicare Other | Attending: Hematology and Oncology

## 2019-11-30 DIAGNOSIS — D509 Iron deficiency anemia, unspecified: Secondary | ICD-10-CM | POA: Insufficient documentation

## 2019-11-30 DIAGNOSIS — I509 Heart failure, unspecified: Secondary | ICD-10-CM | POA: Insufficient documentation

## 2019-11-30 DIAGNOSIS — I272 Pulmonary hypertension, unspecified: Secondary | ICD-10-CM | POA: Insufficient documentation

## 2019-11-30 DIAGNOSIS — I110 Hypertensive heart disease with heart failure: Secondary | ICD-10-CM | POA: Insufficient documentation

## 2019-11-30 DIAGNOSIS — R0601 Orthopnea: Secondary | ICD-10-CM | POA: Insufficient documentation

## 2019-11-30 DIAGNOSIS — K208 Other esophagitis without bleeding: Secondary | ICD-10-CM | POA: Insufficient documentation

## 2019-11-30 DIAGNOSIS — N289 Disorder of kidney and ureter, unspecified: Secondary | ICD-10-CM | POA: Insufficient documentation

## 2019-11-30 DIAGNOSIS — R1310 Dysphagia, unspecified: Secondary | ICD-10-CM | POA: Insufficient documentation

## 2019-11-30 DIAGNOSIS — K5730 Diverticulosis of large intestine without perforation or abscess without bleeding: Secondary | ICD-10-CM | POA: Insufficient documentation

## 2019-11-30 NOTE — Progress Notes (Signed)
[facility]  [address] [place], [place] [address] Phone: [phone]  Fax: [phone]   Clinic [name]:  12/03/2019  Referring physician: [name], MD  Chief Complaint: [name] is a 86 y.o. female with  iron deficiency anemia who is seen for a 4 month assessment.  HPI: The patient was last seen in the hematology clinic on 08/20/2019. At that time, she was doing well. She was beginning to eat some food. Exam was unremarkable. Hematocrit 30.3, hemoglobin 9.9, MCV 88.3, platelets 305,000, WBC 7,300. Ferritin was 55 with an iron saturation 9% and a TIBC of 310. Sed rate was 19. Patient did not require any Venofer.   Patient had a follow up with Jennet Maduro. RD on 08/24/2019 via telephone. She was eating more foods orally. She had been taking 3.5 to 4 cartons of jevity 1.5 daily. She took 3.5 on days she ate more orally.  She had also been giving 58ml prosource via tube (Dr. Ginette Pitman recently increased). She was recommended ensure plus.   She was seen by Dr. Ubaldo Glassing on 10/15/2019. She had no chest pains. Her pulmonary hypertension was stable. She had bilateral lower extremity edema. Her lasix was increased to 40 mg a [name]. She was advised a low sodium diet. She will follow up on 01/13/2020.   Patient was admitted to [facility] from 11/23/2019 - 11/26/2019. She had shortness of breath that she reported felt similar to prior episodes of heart failure. CXR on 11/23/2019 showed CHF. Labs were notable only for stable anemia with hemoglobin 9.8. She initially required BiPAP due to acute hypoxic respiratory failure. Patient was weaned to 2 to 3 L/min oxygen by nasal cannula; later she was weaned off oxygen entirely. Echo on 11/24/2019 showed an EF of 20-25%. She continued her current regimen upon discharge.   She had a hospital follow up with Dr. Ginette Pitman on 11/27/2019. She was feeling better but still felt weak. She noted some dysphagia. She continued her current  medications. Patient will follow up on 4 months.  Patient had a telephone visit with Jennet Maduro, RD on 11/30/2019. Patient was doing better since hospital admission. She was only able to take about 3 sometimes 3.5 cartons of Jevity 1.5. She felt like her stomach "shrank" in the hospital. Reported that she was giving prosource (70 ml per Dr. Ginette Pitman). Patient was encouraged to work back up to 4 cartons per [name] of jevity 1.5 to better meet nutritional needs. Patient will follow up on 01/25/2020.   She was seen by Darylene Price, [place] on 12/01/2019. She had shortness of breath with little exertion. She had associated fatigue, palpitations, light-headedness, back pain and difficulty sleeping along with this. She denied any abdominal distention, pedal edema, chest pain, cough or weight gain. Gets all her medications given via her G-tube. Patient will return in 6 weeks. She continued her current regimen.   Labs on 12/01/2019: Hematocrit 29.6, hemoglobin 9.2, platelets 359,000, WBC 8,900 (ALC 600). Iron saturation was 6% with a TIBC 371. Creatinine 1.43, calcium 8.5 (corr Ca 9.1), total protein 6.2, albumin 3.3. Uric acid 7.9.   During the interim, she has felt weak. Patient reports she has been having epistaxis after blowing her nose. Reports there is a scab present in the right nostril but bleeding comes from both sides.  She has a 24 hour vaporizer.  She reports some vertigo and feels dizzy when lying down on her back.   Her BP is 144/55 and with a  heart rate of 51. She denies feeling dizzy. Dr. Bethanne Ginger office will be contacted.  Patient will be seen by Dr. Ubaldo Glassing on 12/04/2019. She has trouble breathing when she is lying down (orthopnea). She has to sleep with her head propped up. Last night she slept in the recliner.    She notes her feeding tube was replaced during her previous admission. She just completed antibiotics for a UTI. UTI symptoms have resolved. Patient had BLE swelling and chronic joint pain related  to arthritis. She has chronic vision issues. She does not have a nephrologist.    Past Medical History:  Diagnosis Date  . Anemia    IRON INFUSIONS  . Aortic valve sclerosis   . Arrhythmia   . Arthritis    osteoarthritis  . Basal cell carcinoma of skin   . Brain tumor ([place])   . Brain tumor ([place])   . Cervical spine disease   . CHF (congestive heart failure) ([place])   . Diabetes mellitus without complication ([place])   . Dysrhythmia    sinus arrhythmia  . Esophageal stricture    severe, led to feeding tube placement in Sept 2019  . Esophageal ulcer without bleeding   . Gastrostomy tube dependent ([place])    DOES NOT EAT OR DRINK   . GERD (gastroesophageal reflux disease)   . History of kidney stones   . Hyperlipemia   . Hypertension   . Kidney stones   . Leaky heart valve   . Meningioma ([place])   . Occlusive mesenteric ischemia ([place])   . Pulmonary hypertension ([place])   . RLS (restless legs syndrome)   . Vertigo     Past Surgical History:  Procedure Laterality Date  . ABDOMINAL HYSTERECTOMY    . APPENDECTOMY    . ARTHROGRAM KNEE Left   . BACK SURGERY     CERVIVAL NECK FUSION  . CATARACT EXTRACTION W/PHACO Left 11/11/2018   Procedure: CATARACT EXTRACTION PHACO AND INTRAOCULAR LENS PLACEMENT (IOC) LEFT, DIABETIC;  Surgeon: Birder Robson, MD;  Location: [facility] ORS;  Service: Ophthalmology;  Laterality: Left;  [place]  00:41 CDE 6.41 Fluid pack lot # [id number]  . COLONOSCOPY WITH PROPOFOL N/A 06/20/2017   Procedure: COLONOSCOPY WITH PROPOFOL;  Surgeon: Lollie Sails, MD;  Location: [facility] ENDOSCOPY;  Service: Endoscopy;  Laterality: N/A;  . ESOPHAGOGASTRODUODENOSCOPY (EGD) WITH PROPOFOL N/A 03/14/2015   Procedure: ESOPHAGOGASTRODUODENOSCOPY (EGD) WITH PROPOFOL;  Surgeon: Josefine Class, MD;  Location: [facility] ENDOSCOPY;  Service: Endoscopy;  Laterality: N/A;  . ESOPHAGOGASTRODUODENOSCOPY (EGD) WITH PROPOFOL N/A 03/28/2017   Procedure: ESOPHAGOGASTRODUODENOSCOPY (EGD) WITH PROPOFOL;   Surgeon: Lollie Sails, MD;  Location: [facility] ENDOSCOPY;  Service: Endoscopy;  Laterality: N/A;  . ESOPHAGOGASTRODUODENOSCOPY (EGD) WITH PROPOFOL N/A 06/20/2017   Procedure: ESOPHAGOGASTRODUODENOSCOPY (EGD) WITH PROPOFOL;  Surgeon: Lollie Sails, MD;  Location: [facility] ENDOSCOPY;  Service: Endoscopy;  Laterality: N/A;  . ESOPHAGOGASTRODUODENOSCOPY (EGD) WITH PROPOFOL N/A 10/21/2017   Procedure: ESOPHAGOGASTRODUODENOSCOPY (EGD) WITH PROPOFOL;  Surgeon: Lollie Sails, MD;  Location: [facility] ENDOSCOPY;  Service: Endoscopy;  Laterality: N/A;  . ESOPHAGOGASTRODUODENOSCOPY (EGD) WITH PROPOFOL N/A 04/15/2018   Procedure: ESOPHAGOGASTRODUODENOSCOPY (EGD) WITH PROPOFOL;  Surgeon: Lollie Sails, MD;  Location: [facility] ENDOSCOPY;  Service: Endoscopy;  Laterality: N/A;  . ESOPHAGUS SURGERY     CLOSURE  . EYE SURGERY    . HYSTERECTOMY ABDOMINAL WITH SALPINGECTOMY    . IR GASTROSTOMY TUBE MOD SED  06/11/2018  . IR GASTROSTOMY TUBE REMOVAL  03/25/2019  . PERIPHERAL VASCULAR CATHETERIZATION N/A 01/23/2016  Procedure: Visceral Venography;  Surgeon: Algernon Huxley, MD;  Location: [place] CV LAB;  Service: Cardiovascular;  Laterality: N/A;  . PERIPHERAL VASCULAR CATHETERIZATION  01/23/2016   Procedure: Peripheral Vascular Intervention;  Surgeon: Algernon Huxley, MD;  Location: [place] CV LAB;  Service: Cardiovascular;;  . UPPER ESOPHAGEAL ENDOSCOPIC ULTRASOUND (EUS) N/A 12/05/2017   Procedure: UPPER ESOPHAGEAL ENDOSCOPIC ULTRASOUND (EUS);  Surgeon: Jola Schmidt, MD;  Location: [facility] ENDOSCOPY;  Service: Endoscopy;  Laterality: N/A;  . VISCERAL ANGIOGRAPHY N/A 03/18/2017   Procedure: Visceral Angiography;  Surgeon: Algernon Huxley, MD;  Location: [place] CV LAB;  Service: Cardiovascular;  Laterality: N/A;  . VISCERAL ARTERY INTERVENTION N/A 03/18/2017   Procedure: Visceral Artery Intervention;  Surgeon: Algernon Huxley, MD;  Location: [place] CV LAB;  Service: Cardiovascular;  Laterality: N/A;     Family History  Problem Relation Age of Onset  . Stroke Mother   . Hypertension Mother   . Heart disease Mother   . Cancer Father   . Heart disease Father   . Heart attack Sister   . Diabetes Sister   . Heart attack Brother     Social History:  reports that she has never smoked. She has never used smokeless tobacco. She reports that she does not drink alcohol or use drugs.  She is retired. She worked at [employer]. She lives in [place].Patient's contact number is [phone]. Daughter's number is [phone] [name]). The patient is alone today.  Allergies:  Allergies  Allergen Reactions  . Gabapentin Swelling  . Lipitor [Atorvastatin] Other (See Comments)    Muscle aches  . Mevacor [Lovastatin] Other (See Comments)    Muscle aches  . Milk-Related Compounds Other (See Comments)    Large quantities cause headaches   . Septra [Sulfamethoxazole-Trimethoprim] Other (See Comments)    Unknown  . Statins Other (See Comments)    Muscle pain  . Sulfur Diarrhea and Nausea And Vomiting  . Zocor [Simvastatin] Other (See Comments)    Muscle aches    Current Medications: Current Outpatient Medications  Medication Sig Dispense Refill  . acetaminophen (TYLENOL) 160 MG/5ML solution Place 20.3 mLs (650 mg total) into feeding tube every 6 (six) hours as needed for mild pain. 120 mL 0  . albuterol (VENTOLIN HFA) 108 (90 Base) MCG/ACT inhaler Inhale 2 puffs into the lungs every 4 (four) hours as needed for shortness of breath or wheezing.    Marland Kitchen allopurinol (ZYLOPRIM) 100 MG tablet Place 100 mg into feeding tube daily.    . Amino Acids-Protein Hydrolys (FEEDING SUPPLEMENT, PRO-STAT SUGAR FREE 64,) LIQD Place 30 mLs into feeding tube daily. 450 mL 1  . clopidogrel (PLAVIX) 75 MG tablet TAKE 1 TABLET BY MOUTH ONCE DAILY (Patient taking differently: Place 75 mg into feeding tube daily. ) 30 tablet 2  . diltiazem (CARDIZEM) 120 MG tablet Take 120 mg by mouth daily.     .  furosemide (LASIX) 10 MG/ML solution Place 60 mg into feeding tube daily.     Marland Kitchen glipiZIDE (GLUCOTROL) 5 MG tablet Place 1 tablet (5 mg total) into feeding tube daily before breakfast.    . hydrALAZINE (APRESOLINE) 50 MG tablet Place 1 tablet (50 mg total) into feeding tube 2 (two) times daily.    . Hypromellose (GENTEAL MILD) 0.2 % SOLN Place 1 drop into both eyes 3 (three) times daily as needed (dry eyes).     Marland Kitchen lisinopril (ZESTRIL) 40 MG tablet Take 20 mg by mouth daily.     Marland Kitchen  loperamide (IMODIUM) 1 MG/5ML solution Take 3 mg by mouth as needed for diarrhea or loose stools.    . meclizine (ANTIVERT) 25 MG tablet Place 25 mg into feeding tube as needed for dizziness.     . metoprolol tartrate (LOPRESSOR) 25 MG tablet Take 1 tablet (25 mg total) by mouth 2 (two) times daily. 60 tablet 0  . Multiple Vitamin (MULTIVITAMIN) LIQD Place 15 mLs into feeding tube daily. 450 mL 1  . Nutritional Supplements (FEEDING SUPPLEMENT, JEVITY 1.5 CAL/FIBER,) LIQD Place 237 mLs into feeding tube 4 (four) times daily. 237 mL 90  . pantoprazole sodium (PROTONIX) 40 mg/20 mL PACK Place 20 mLs (40 mg total) into feeding tube 2 (two) times daily. 30 each 1  . rOPINIRole (REQUIP) 2 MG tablet Place 1 tablet (2 mg total) into feeding tube 2 (two) times daily. 60 tablet 1  . vitamin C (VITAMIN C) 250 MG tablet Place 1 tablet (250 mg total) into feeding tube 2 (two) times daily. 60 tablet 1  . Water For Irrigation, Sterile (FREE WATER) SOLN Place 30 mLs into feeding tube every 4 (four) hours.     No current facility-administered medications for this visit.    Review of Systems  Constitutional: Negative.  Negative for chills, diaphoresis, fever, malaise/fatigue and weight loss (up 4 lbs).       Feels "weak".  HENT: Positive for nosebleeds (after blowing nose; scab in right nostril). Negative for congestion, ear pain, hearing loss, sinus pain and sore throat.   Eyes: Positive for blurred vision (vision issues, chronic).  Negative for double vision, photophobia and pain.  Respiratory: Negative.  Negative for cough, sputum production, shortness of breath and wheezing.   Cardiovascular: Positive for orthopnea and leg swelling (BLE). Negative for palpitations and PND.  Gastrointestinal: Negative for abdominal pain, blood in stool, constipation, diarrhea, heartburn, melena, nausea and vomiting.       Eating better.  Genitourinary: Negative.  Negative for dysuria, frequency, hematuria and urgency.  Musculoskeletal: Positive for joint pain (arthritis, chronic pain). Negative for back pain, myalgias and neck pain.  Skin: Negative.  Negative for rash.  Neurological: Positive for dizziness and weakness (generalized). Negative for tingling, sensory change, focal weakness, seizures and headaches.       Vertigo.  Endo/Heme/Allergies: Bruises/bleeds easily.       Thyroid disease on Synthroid.   Psychiatric/Behavioral: Negative.  Negative for depression and memory loss. The patient is not nervous/anxious and does not have insomnia.   All other systems reviewed and are negative.  Performance status (ECOG): 2  Vitals Blood pressure (!) 114/55, pulse (!) 51, temperature (!) 97.5 F (36.4 C), temperature source Oral, resp. rate 18, weight 122 lb 12.7 oz (55.7 kg), SpO2 100 %.   Physical Exam  Constitutional: She is oriented to person, place, and time. She appears well-developed and well-nourished. No distress.  She has a cane by her side.  HENT:  Head: Normocephalic and atraumatic.  Right Ear: External ear normal.  Mouth/Throat: Oropharynx is clear and moist.  Short gray hair.  Mask.  Eyes: Pupils are equal, round, and reactive to light. Conjunctivae and EOM are normal. No scleral icterus.  Glasses.  Hazel eyes.  Neck: No JVD present.  Cardiovascular: Normal rate, regular rhythm and normal heart sounds. Exam reveals no gallop and no friction rub.  Aortic stenosis murmur.  Pulmonary/Chest: Effort normal. No respiratory  distress. She has no wheezes. She has rales (dry).  Shortness of breath lying back at 40 degrees.  Abdominal: Soft. Bowel sounds are normal. She exhibits no distension and no mass. There is no abdominal tenderness. There is no rebound and no guarding.  G tube.  Musculoskeletal:        General: Tenderness (BLE) and edema (2+, BLE) present. Normal range of motion.     Cervical back: Normal range of motion and neck supple.     Right ankle: No tenderness.     Right Achilles Tendon: No pain.     Left ankle: No tenderness.     Left Achilles Tendon: No pain.  Lymphadenopathy:       Head (right side): No preauricular, no posterior auricular and no occipital adenopathy present.       Head (left side): No preauricular, no posterior auricular and no occipital adenopathy present.    She has no cervical adenopathy.    She has no axillary adenopathy.       Right: No inguinal and no supraclavicular adenopathy present.       Left: No inguinal and no supraclavicular adenopathy present.  Neurological: She is alert and oriented to person, place, and time.  Skin: Skin is warm and dry. Ecchymosis (upper extremity) noted. No rash noted. She is not diaphoretic. No erythema. No pallor.  Psychiatric: She has a normal mood and affect. Her behavior is normal. Judgment and thought content normal.  Nursing note and vitals reviewed.   Office Visit on 12/01/2019  Component Date Value Ref Range Status  . Glucose-Capillary 12/01/2019 126* 70 - 99 mg/dL Final   Glucose reference range applies only to samples taken after fasting for at least 8 hours.  Appointment on 12/01/2019  Component Date Value Ref Range Status  . Uric Acid, Serum 12/01/2019 7.9* 2.5 - 7.1 mg/dL Final   Performed at [facility], [address] [place], [place] [address]  . Sodium 12/01/2019 139  135 - 145 mmol/L Final  . Potassium 12/01/2019 4.1  3.5 - 5.1 mmol/L Final  . Chloride 12/01/2019 99  98 - 111 mmol/L Final  . CO2  12/01/2019 28  22 - 32 mmol/L Final  . Glucose, Bld 12/01/2019 161* 70 - 99 mg/dL Final   Glucose reference range applies only to samples taken after fasting for at least 8 hours.  . BUN 12/01/2019 84* 8 - 23 mg/dL Final  . Creatinine, Ser 12/01/2019 1.43* 0.44 - 1.00 mg/dL Final  . Calcium 12/01/2019 8.5* 8.9 - 10.3 mg/dL Final  . Total Protein 12/01/2019 6.2* 6.5 - 8.1 g/dL Final  . Albumin 12/01/2019 3.3* 3.5 - 5.0 g/dL Final  . AST 12/01/2019 17  15 - 41 U/L Final  . ALT 12/01/2019 21  0 - 44 U/L Final  . Alkaline Phosphatase 12/01/2019 72  38 - 126 U/L Final  . Total Bilirubin 12/01/2019 0.6  0.3 - 1.2 mg/dL Final  . GFR calc non Af Amer 12/01/2019 33* >60 mL/min Final  . GFR calc Af Amer 12/01/2019 38* >60 mL/min Final  . Anion gap 12/01/2019 12  5 - 15 Final   Performed at [facility], [address], [place], [place] [address]  . WBC 12/01/2019 8.9  4.0 - 10.5 K/uL Final  . RBC 12/01/2019 3.48* 3.87 - 5.11 MIL/uL Final  . Hemoglobin 12/01/2019 9.2* 12.0 - 15.0 g/dL Final  . HCT 12/01/2019 29.6* 36.0 - 46.0 % Final  . MCV 12/01/2019 85.1  80.0 - 100.0 fL Final  . MCH 12/01/2019 26.4  26.0 - 34.0 pg Final  . MCHC  12/01/2019 31.1  30.0 - 36.0 g/dL Final  . RDW 12/01/2019 15.9* 11.5 - 15.5 % Final  . Platelets 12/01/2019 359  150 - 400 K/uL Final  . nRBC 12/01/2019 0.0  0.0 - 0.2 % Final  . Neutrophils Relative % 12/01/2019 83  % Final  . Neutro Abs 12/01/2019 7.4  1.7 - 7.7 K/uL Final  . Lymphocytes Relative 12/01/2019 7  % Final  . Lymphs Abs 12/01/2019 0.6* 0.7 - 4.0 K/uL Final  . Monocytes Relative 12/01/2019 6  % Final  . Monocytes Absolute 12/01/2019 0.5  0.1 - 1.0 K/uL Final  . Eosinophils Relative 12/01/2019 4  % Final  . Eosinophils Absolute 12/01/2019 0.4  0.0 - 0.5 K/uL Final  . Basophils Relative 12/01/2019 0  % Final  . Basophils Absolute 12/01/2019 0.0  0.0 - 0.1 K/uL Final  . Immature Granulocytes 12/01/2019 0  % Final  . Abs Immature Granulocytes  12/01/2019 0.03  0.00 - 0.07 K/uL Final   Performed at [facility], [address], [place], [place] [address]  . Iron 12/01/2019 22* 28 - 170 ug/dL Final  . TIBC 12/01/2019 371  250 - 450 ug/dL Final  . Saturation Ratios 12/01/2019 6* 10.4 - 31.8 % Final  . UIBC 12/01/2019 349  ug/dL Final   Performed at [facility], [address], [place], [place] [address]    Assessment:  [name] is a 86 y.o. female withiron deficiency anemia. She has been on oral iron x 1 year with continued decline in her counts. She has dysphagia and odynophagia. She is on PPI and carafate. Dietis modest. She denies pica.  Abdomen and pelvic CTon 02/19/2017 revealed no acute abnormality. She has mild sigmoid diverticulosis.  CBCon 02/26/2017 revealed a hematocrit of 25.3, hemoglobin 8.0, and MCV 83.2. Hemoccult studiesx 1 were positive in 11/19/2016 and negative x 2 in 01/2017. Ferritin was 5 with an iron saturation of 4%, and a TIBC of 487 on 01/30/2017. Normal studies included: creatinine (1.0), calcium, albumen, B12 (647), and TSH. Folic acid was [id number] on 08/09/2015. Urinalysis revealed no blood on 04/25/2018and 10/28/2018. SPEP was negative on 02/28/2017.  She received Venoferweekly x 3 (02/28/2017 - 03/14/2017), x 2 (04/19/2017 - 04/26/2017), x 2 (06/14/2017-06/21/2017),11/21/2017, x 3 (01/09/2018 - 01/23/2018), 02/20/2018, x 2 (08/22/2018 - 09/01/2018), 11/26/2018, x3 (02/18/2019 - 03/04/2019), and x 3 (05/13/2019 - 05/29/2019).  Ferritin has been followed: 8 on 02/28/2017, 164 on 03/19/2017, 27 on 04/18/2017, 82 on 05/17/2017, 38 on 06/10/2017, 100 on 07/18/2017, 40 on 09/18/2017, 9 on 11/19/2017, 13 on 01/02/2018, 57 on 02/19/2018, 72 on 04/04/2018, 41 on 05/21/2018, 36 on 08/18/2018,80 on 09/22/2018, 45 on 11/25/2018,96 on 01/07/2019,48 on 02/17/2019, 104 on 04/02/2019, 73 on 05/12/2019, 123 on 06/29/2019, 55 on 08/20/2019, and 26 on 11/26/2019.  EGDin  2016 revealed gastric ulcers and esophageal stricture which was dilated. Barium swallowon 11/15/2015 was normal. She has a history of mesenteric ischemia. 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 She was on Coumadin, and now on Plavix. Colonoscopyin 2008 revealed diverticulosis.   EGDon 03/28/2017 revealed a moderate-sized area of extrinsic compression was found in the mid esophagus from about 25-28 cm, no apparent defect or abnormality in the mucosa or esophageal wall. There was a 2 cm cratered esophageal ulcer. There was segmental moderate mucosal changes characterized by granularity at the gastroesophageal junction. There was localized mild inflammation characterized by congestion (edema), depression and erythema was found on the greater curvature of the gastric body. Biopsies revealed  no H pylori, dysplasia or malignancy. She is on pantoprazole and Carafate. She has esophageal spasmsimproved with Bentyl.  EGDon 06/20/2017 revealed a non-bleeding esophageal ulcer, gastritis, and a normal duodenum. Pathology revealed mild reactive gastropathy. Colonoscopyon 06/20/2017 revealed non-bleeding external hemorrhoids, diverticulosis in the sigmoid colon, and one 1 mm polyp at the recto-sigmoid colon. Pathology revealed a tubular adenoma Negative for high grade dysplasia or malignancy.  Upper EUSon 12/05/2017 revealed esophageal stenosis. Biopsy revealed CMV viral cytopathic effect.There was no evidence of malignancy. The EUS scope could not be advanced into the stenosis as it was too tight. There was loss of normal wall layers and diffusely hypoechoic measuring from 3 mm to 6 mm in thickness.   She was treated with valacyclovir x 28 days. CMV DNA quantitative by PCR was positive on 01/02/2018 and <200 IU/ml on 01/30/2018.  Upper GI endoscopyon 04/15/2018 revealed extrinsic compression in the mid esophagus. Esophageal biopsy revealed detached stratified squamous  epithelium with focal reactive parakeratosis. There was no dysplasia or malignancy. There was a benign appearing esophageal stenosis. She underwentEGD with esophageal dilatationon 05/01/2018 and 05/19/2018 at [facility].  Chest CT on 05/28/2018 revealed distal esophageal stricture at the GE junction with associated mild submucosal edema, possibly reflecting a post infectious/inflammatory stricture, underlying neoplasm not entirely excluded. There was no extrinsic compression.  She underwent PEG tube placementon 09/11/2019and exchange on 03/25/2019. She takes little by mouth.  Symptomatically, she is fatigued.  She denies any bleeding except for limited epistaxis.  She has orthopnea and lower extremity edema.  Plan: 1.   Review labs from 12/01/2019. 2. Iron deficiency anemia Hematocrit29.6. Hemoglobin 9.2. MCV85.1. Ferritin26 on 11/26/2019 with an iron saturation of 6% (low) and a TIBC 371. Discuss Venofer if ferritin < 30.  Venofer today and weekly x 2 (total 3).             B12 was 706 and folate 25 on 05/12/2019.             Retic was 1.5% (low) on 06/29/2019.             Continue to monitor. 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 Patient'sprimary nutrition is through her G-tube. 4.   Renal insufficiency  Creatinine 1.43 on 12/01/2019.  Creatinine was 0.75-0.85 1 year ago.  Suspect etiology is diuresis secondary to CHF.  Fax labs to Dr Linton Ham office.  Consult Dr Holley Raring, nephrology. 5.   Orthopnea  Patient is fatigued.  Echo on 11/24/2019 showed an EF of 20-25%.   Follow-up as scheduled with Dr Ubaldo Glassing tomorrow. 6.   RTC in 2 months for MD assess, labs (CBC with diff, BMP, ferritin, iron studies- [name] before) and +.- Venofer.  I discussed the assessment and treatment plan with the patient.  The patient was provided an opportunity to  ask questions and all were answered.  The patient agreed with the plan and demonstrated an understanding of the instructions.  The patient was advised to call back if the symptoms worsen or if the condition fails to improve as anticipated.   Lequita Asal, MD, PhD    12/03/2019, 1:56 PM  I, Selena Batten, am acting as scribe for [employer]. Mike Gip, MD, PhD.  I, Esaiah Wanless C. Mike Gip, MD, have reviewed the above documentation for accuracy and completeness, and I agree with the above.

## 2019-11-30 NOTE — Progress Notes (Signed)
Nutrition Follow-up:  Patient followed by Dr. Mike Gip for iron deficiency anemia.  Patient with history of CMV esophagitis and esophageal stenosis.  Noted recent hospital admission due to feeding to came out, replaced on 2/24 and CHF.    Spoke with patient via phone.  Patient reports that she is doing better after being in the hospital.  Reports that she is only able to take about 3 sometimes 3.5 cartons of Jevity 1.5.  "I think my stomach shrank in the hospital."  Reports that she is giving prosource (33ml per Dr. Ginette Pitman).  Has not eaten much orally since discharge just some chicken soup (broth).    Reports diarrhea as on antibiotic current for UTI.  Has imodium AD that she takes which helps.    Medications: reviewed  Labs: reviewed  Anthropometrics:   Weight at hospital discharge 114 lb (fluid removed).  Noted 115 lb on 8/13   Estimated Energy Needs  Kcals: [id number] Protein: 78-91 g Fluid: 1.5 L, per MD  NUTRITION DIAGNOSIS: Inadequate oral intake continues relying on feeding tube   INTERVENTION:  Patient to work back up to 4 cartons per day of jevity 1.5 to better meet nutritional needs.  Prosource to continue at 11ml/hr q daily per Dr. Ginette Pitman.   This provides 1670 calories, 85 g protein and 1261ml free water (not including water from medications) Patient has contact information    MONITORING, EVALUATION, GOAL: Patient will utilize feeding tube to meet nutritional needs   NEXT VISIT: April 26 phone f/u  Nirali Magouirk B. Zenia Resides, [place], [place] Registered Dietitian [phone] (pager)

## 2019-11-30 NOTE — Progress Notes (Signed)
Patient ID: [name], female    DOB: 1929/10/01, 86 y.o.   MRN: [id number]  HPI  [name] is a 86 y/o female with a history of DM, hyperlipidemia, HTN, CKD, brain tumor, GERD, pulmonary HTN, anemia, G-tube and chronic heart failure.   Echo report from 11/24/19 reviewed and showed an EF of 20-25% along with severe MR, mod/severe TR, mild AR, moderately elevated PA pressure and large pleural effusion on the left.   Admitted 11/23/19 due to acute on chronic HF. Cardiology consult obtained. Patient had stopped her diuretics. Initially needed bipap but then quickly weaned to room air. Elevated troponins thought to be due to demand ischemia. Initially given IV lasix and then transitioned to oral diuretics. Discharged after 3 days.   She presents today for her initial visit with a chief complaint of moderate shortness of breath with little exertion. She describes this as chronic in nature having been present for several years. She has associated fatigue, palpitations, light-headedness, back pain and difficulty sleeping along with this. She denies any abdominal distention, pedal edema, chest pain, cough or weight gain.   Says that she had blood work drawn today for her anemia and is having an iron infusion soon. Gets all her medications given via her G-tube.   Past Medical History:  Diagnosis Date  . Anemia    IRON INFUSIONS  . Aortic valve sclerosis   . Arrhythmia   . Arthritis    osteoarthritis  . Basal cell carcinoma of skin   . Brain tumor ([place])   . Brain tumor ([place])   . Cervical spine disease   . CHF (congestive heart failure) ([place])   . Diabetes mellitus without complication ([place])   . Dysrhythmia    sinus arrhythmia  . Esophageal stricture    severe, led to feeding tube placement in Sept 2019  . Esophageal ulcer without bleeding   . Gastrostomy tube dependent ([place])    DOES NOT EAT OR DRINK   . GERD (gastroesophageal reflux disease)   . History of kidney stones   .  Hyperlipemia   . Hypertension   . Kidney stones   . Leaky heart valve   . Meningioma ([place])   . 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 Pulmonary hypertension ([place])   . RLS (restless legs syndrome)   . Vertigo    Past Surgical History:  Procedure Laterality Date  . ABDOMINAL HYSTERECTOMY    . APPENDECTOMY    . ARTHROGRAM KNEE Left   . BACK SURGERY     CERVIVAL NECK FUSION  . CATARACT EXTRACTION W/PHACO Left 11/11/2018   Procedure: CATARACT EXTRACTION PHACO AND INTRAOCULAR LENS PLACEMENT (IOC) LEFT, DIABETIC;  Surgeon: Birder Robson, MD;  Location: [facility] ORS;  Service: Ophthalmology;  Laterality: Left;  [place]  00:41 CDE 6.41 Fluid pack lot # [id number]  . COLONOSCOPY WITH PROPOFOL N/A 06/20/2017   Procedure: COLONOSCOPY WITH PROPOFOL;  Surgeon: Lollie Sails, MD;  Location: [facility] ENDOSCOPY;  Service: Endoscopy;  Laterality: N/A;  . ESOPHAGOGASTRODUODENOSCOPY (EGD) WITH PROPOFOL N/A 03/14/2015   Procedure: ESOPHAGOGASTRODUODENOSCOPY (EGD) WITH PROPOFOL;  Surgeon: Josefine Class, MD;  Location: [facility] ENDOSCOPY;  Service: Endoscopy;  Laterality: N/A;  . ESOPHAGOGASTRODUODENOSCOPY (EGD) WITH PROPOFOL N/A 03/28/2017   Procedure: ESOPHAGOGASTRODUODENOSCOPY (EGD) WITH PROPOFOL;  Surgeon: Lollie Sails, MD;  Location: [facility] ENDOSCOPY;  Service: Endoscopy;  Laterality: N/A;  . ESOPHAGOGASTRODUODENOSCOPY (EGD) WITH PROPOFOL N/A 06/20/2017   Procedure: ESOPHAGOGASTRODUODENOSCOPY (EGD) WITH PROPOFOL;  Surgeon: Lollie Sails, MD;  Location: [facility] ENDOSCOPY;  Service: Endoscopy;  Laterality: N/A;  . ESOPHAGOGASTRODUODENOSCOPY (EGD) WITH PROPOFOL N/A 10/21/2017   Procedure: ESOPHAGOGASTRODUODENOSCOPY (EGD) WITH PROPOFOL;  Surgeon: Lollie Sails, MD;  Location[facility] ENDOSCOPY;  Service: Endoscopy;  Laterality: N/A;  . ESOPHAGOGASTRODUODENOSCOPY (EGD) WITH PROPOFOL N/A 04/15/2018   Procedure: ESOPHAGOGASTRODUODENOSCOPY (EGD) WITH PROPOFOL;  Surgeon: Lollie Sails, MD;  Location: [facility]  ENDOSCOPY;  Service: Endoscopy;  Laterality: N/A;  . ESOPHAGUS SURGERY     CLOSURE  . EYE SURGERY    . HYSTERECTOMY ABDOMINAL WITH SALPINGECTOMY    . IR GASTROSTOMY TUBE MOD SED  06/11/2018  . IR GASTROSTOMY TUBE REMOVAL  03/25/2019  . PERIPHERAL VASCULAR CATHETERIZATION N/A 01/23/2016   Procedure: Visceral Venography;  Surgeon: Algernon Huxley, MD;  Location: [place] CV LAB;  Service: Cardiovascular;  Laterality: N/A;  . PERIPHERAL VASCULAR CATHETERIZATION  01/23/2016   Procedure: Peripheral Vascular Intervention;  Surgeon: Algernon Huxley, MD;  Location: [place] CV LAB;  Service: Cardiovascular;;  . UPPER ESOPHAGEAL ENDOSCOPIC ULTRASOUND (EUS) N/A 12/05/2017   Procedure: UPPER ESOPHAGEAL ENDOSCOPIC ULTRASOUND (EUS);  Surgeon: Jola Schmidt, MD;  Location: [facility] ENDOSCOPY;  Service: Endoscopy;  Laterality: N/A;  . VISCERAL ANGIOGRAPHY N/A 03/18/2017   Procedure: Visceral Angiography;  Surgeon: Algernon Huxley, MD;  Location: [place] CV LAB;  Service: Cardiovascular;  Laterality: N/A;  . VISCERAL ARTERY INTERVENTION N/A 03/18/2017   Procedure: Visceral Artery Intervention;  Surgeon: Algernon Huxley, MD;  Location: [place] CV LAB;  Service: Cardiovascular;  Laterality: N/A;   Family History  Problem Relation Age of Onset  . Stroke Mother   . Hypertension Mother   . Heart disease Mother   . Cancer Father   . Heart disease Father   . Heart attack Sister   . Diabetes Sister   . Heart attack Brother    Social History   Tobacco Use  . Smoking status: Never Smoker  . Smokeless tobacco: Never Used  Substance Use Topics  . Alcohol use: Never   Allergies  Allergen Reactions  . Gabapentin Swelling  . Lipitor [Atorvastatin] Other (See Comments)    Muscle aches  . Mevacor [Lovastatin] Other (See Comments)    Muscle aches  . Milk-Related Compounds Other (See Comments)    Large quantities cause headaches   . Septra [Sulfamethoxazole-Trimethoprim] Other (See Comments)    Unknown  .  Statins Other (See Comments)    Muscle pain  . Sulfur Diarrhea and Nausea And Vomiting  . Zocor [Simvastatin] Other (See Comments)    Muscle aches   Prior to Admission medications   Medication Sig Start Date End Date Taking? Authorizing Provider  acetaminophen (TYLENOL) 160 MG/5ML solution Place 20.3 mLs (650 mg total) into feeding tube every 6 (six) hours as needed for mild pain. 06/13/18  Yes Demetrios Loll, MD  albuterol (VENTOLIN HFA) 108 (90 Base) MCG/ACT inhaler Inhale 2 puffs into the lungs every 4 (four) hours as needed for shortness of breath or wheezing.   Yes [provider]  allopurinol (ZYLOPRIM) 100 MG tablet Place 100 mg into feeding tube daily.   Yes [provider]  Amino Acids-Protein Hydrolys (FEEDING SUPPLEMENT, PRO-STAT SUGAR FREE 64,) LIQD Place 30 mLs into feeding tube daily. 06/13/18  Yes Demetrios Loll, MD  clopidogrel (PLAVIX) 75 MG tablet TAKE 1 TABLET BY MOUTH ONCE DAILY Patient taking differently: Place 75 mg into feeding tube daily.  10/07/18  Yes Dew, Erskine Squibb, MD  diltiazem (CARDIZEM) 120 MG tablet Take 120 mg  by mouth daily.  08/16/19  Yes [provider]  furosemide (LASIX) 10 MG/ML solution Place 60 mg into feeding tube daily.    Yes [provider]  glipiZIDE (GLUCOTROL) 5 MG tablet Place 1 tablet (5 mg total) into feeding tube daily before breakfast. 06/13/18  Yes Demetrios Loll, MD  hydrALAZINE (APRESOLINE) 50 MG tablet Place 1 tablet (50 mg total) into feeding tube 2 (two) times daily. Patient taking differently: Place 25 mg into feeding tube 2 (two) times daily.  06/13/18  Yes Demetrios Loll, MD  Hypromellose [facility] MILD) 0.2 % SOLN Place 1 drop into both eyes 3 (three) times daily as needed (dry eyes).    Yes [provider]  lisinopril (ZESTRIL) 40 MG tablet Take 20 mg by mouth daily.    Yes [provider]  loperamide (IMODIUM) 1 MG/5ML solution Take 3 mg by mouth as needed for diarrhea or loose stools.   Yes [provider]  meclizine (ANTIVERT) 25 MG tablet Place 25 mg into feeding tube as needed for dizziness.    Yes [provider]  metoprolol tartrate (LOPRESSOR) 25 MG tablet Take 1 tablet (25 mg total) by mouth 2 (two) times daily. 11/26/19  Yes Ezekiel Slocumb, DO  Multiple Vitamin (MULTIVITAMIN) LIQD Place 15 mLs into feeding tube daily. 06/13/18  Yes Demetrios Loll, MD  Nutritional Supplements (FEEDING SUPPLEMENT, JEVITY 1.5 CAL/FIBER,) LIQD Place 237 mLs into feeding tube 4 (four) times daily. 06/25/18  Yes Dustin Flock, MD  pantoprazole sodium (PROTONIX) 40 mg/20 mL PACK Place 20 mLs (40 mg total) into feeding tube 2 (two) times daily. 06/25/18  Yes Dustin Flock, MD  rOPINIRole (REQUIP) 2 MG tablet Place 1 tablet (2 mg total) into feeding tube 2 (two) times daily. 06/13/18  Yes Demetrios Loll, MD  vitamin C (VITAMIN C) 250 MG tablet Place 1 tablet (250 mg total) into feeding tube 2 (two) times daily. 06/13/18  Yes Demetrios Loll, MD  Water For Irrigation, Sterile (FREE WATER) SOLN Place 30 mLs into feeding tube every 4 (four) hours. 06/25/18  Yes Dustin Flock, MD    Review of Systems  Constitutional: Positive for fatigue. Negative for appetite change.  HENT: Positive for congestion. Negative for postnasal drip and sore throat.   Eyes: Negative.   Respiratory: Positive for shortness of breath. Negative for cough and chest tightness.   Cardiovascular: Positive for palpitations. Negative for chest pain and leg swelling.  Gastrointestinal: Negative for abdominal distention and abdominal pain.  Endocrine: Negative.   Genitourinary: Negative.   Musculoskeletal: Positive for back pain. Negative for neck pain.  Skin: Negative.   Allergic/Immunologic: Negative.   Neurological: Positive for light-headedness. Negative for dizziness.  Hematological: Negative for adenopathy. Does not bruise/bleed easily.  Psychiatric/Behavioral: Positive for sleep disturbance (sleeping on 3 pillows). Negative for  dysphoric mood. The patient is not nervous/anxious.     Vitals:   12/01/19 0914  BP: (!) 115/56  Pulse: (!) 57  Resp: 15  SpO2: 100%  Weight: 122 lb 9.6 oz (55.6 kg)  Height: 5\' 3"  (1.6 m)   Wt Readings from Last 3 Encounters:  12/01/19 122 lb 9.6 oz (55.6 kg)  11/26/19 114 lb 8 oz (51.9 kg)  08/20/19 118 lb 15 oz (53.9 kg)   Lab Results  Component Value Date   CREATININE 1.43 (H) 12/01/2019   CREATININE 1.06 (H) 11/26/2019   CREATININE 1.01 (H) 11/25/2019     Physical Exam Vitals and nursing note reviewed.  Constitutional:  Appearance: She is well-developed.  HENT:     Head: Normocephalic and atraumatic.  Neck:     Vascular: No JVD.  Cardiovascular:     Rate and Rhythm: Regular rhythm. Bradycardia present.  Pulmonary:     Effort: Pulmonary effort is normal. No respiratory distress.     Breath sounds: No wheezing or rales.  Abdominal:     Palpations: Abdomen is soft.     Tenderness: There is no abdominal tenderness.  Musculoskeletal:     Cervical back: Normal range of motion and neck supple.     Right lower leg: No tenderness. Edema (1+ pitting) present.     Left lower leg: No tenderness. Edema (1+ pitting) present.  Skin:    General: Skin is warm and dry.  Neurological:     General: No focal deficit present.     Mental Status: She is alert and oriented to person, place, and time.  Psychiatric:        Mood and Affect: Mood normal.        Behavior: Behavior normal.     Assessment & Plan:  1: Chronic heart failure with reduced ejection fraction- - NYHA class III - euvolemic today - weighing daily and says that her weight ranges from 116-119; instructed to call for an overnight weight gain of >2 pounds or a weekly weight gain of >5 pounds - eats some foods like ice cream but rarely; gets all feeding through G-tube - saw cardiology (Fath) 10/15/19 & returns 12/04/19 - takes furosemide 60mg  daily and an additional 60mg  in the afternoon as needed for weight  gain or symptoms - BNP 11/23/19 was 1969.0 - has home health nurse from [place] coming - reports receiving her flu vaccine for this season - does wear compression socks and elevates her legs; does not have compression socks on today - will consult with PharmD about whether entresto can be crushed and given through feeding tube and for possibly changing her beta blocker to carvedilol  2: HTN- - BP on the low side but reports that yesterday she did take an extra dose of furosemide - saw PCP (Hande) 11/27/19 - BMP 11/26/19 reviewed and showed sodium 145, potassium 3.4, creatinine 1.06 and GFR 47  3: DM- - A1c 11/23/19 was 6.9% - nonfasting glucose in clinic today was 126  4: Anemia- - had CBC drawn earlier today - due for iron infusion soon - sees Dr. Mike Gip on 12/03/19  Patient did not bring her medications nor a list. Each medication was verbally reviewed with the patient and she was encouraged to bring the bottles to every visit to confirm accuracy of list.  Return in 6 weeks or sooner for any questions/problems before then.

## 2019-12-01 ENCOUNTER — Encounter: Payer: Self-pay | Admitting: Family

## 2019-12-01 ENCOUNTER — Inpatient Hospital Stay: Payer: Medicare Other

## 2019-12-01 ENCOUNTER — Other Ambulatory Visit: Payer: Self-pay

## 2019-12-01 ENCOUNTER — Other Ambulatory Visit: Payer: Medicare Other

## 2019-12-01 ENCOUNTER — Ambulatory Visit: Payer: Medicare Other | Attending: Family | Admitting: Family

## 2019-12-01 VITALS — BP 115/56 | HR 57 | Resp 15 | Ht 63.0 in | Wt 122.4 lb

## 2019-12-01 DIAGNOSIS — D649 Anemia, unspecified: Secondary | ICD-10-CM | POA: Diagnosis not present

## 2019-12-01 DIAGNOSIS — Z87442 Personal history of urinary calculi: Secondary | ICD-10-CM | POA: Insufficient documentation

## 2019-12-01 DIAGNOSIS — N1831 Chronic kidney disease, stage 3a: Secondary | ICD-10-CM

## 2019-12-01 DIAGNOSIS — Z882 Allergy status to sulfonamides status: Secondary | ICD-10-CM | POA: Insufficient documentation

## 2019-12-01 DIAGNOSIS — N189 Chronic kidney disease, unspecified: Secondary | ICD-10-CM | POA: Insufficient documentation

## 2019-12-01 DIAGNOSIS — Z9842 Cataract extraction status, left eye: Secondary | ICD-10-CM | POA: Diagnosis not present

## 2019-12-01 DIAGNOSIS — Z981 Arthrodesis status: Secondary | ICD-10-CM | POA: Diagnosis not present

## 2019-12-01 DIAGNOSIS — Z833 Family history of diabetes mellitus: Secondary | ICD-10-CM | POA: Insufficient documentation

## 2019-12-01 DIAGNOSIS — K5730 Diverticulosis of large intestine without perforation or abscess without bleeding: Secondary | ICD-10-CM | POA: Diagnosis not present

## 2019-12-01 DIAGNOSIS — D509 Iron deficiency anemia, unspecified: Secondary | ICD-10-CM | POA: Diagnosis not present

## 2019-12-01 DIAGNOSIS — E785 Hyperlipidemia, unspecified: Secondary | ICD-10-CM | POA: Diagnosis not present

## 2019-12-01 DIAGNOSIS — E119 Type 2 diabetes mellitus without complications: Secondary | ICD-10-CM | POA: Diagnosis present

## 2019-12-01 DIAGNOSIS — Z8249 Family history of ischemic heart disease and other diseases of the circulatory system: Secondary | ICD-10-CM | POA: Insufficient documentation

## 2019-12-01 DIAGNOSIS — Z7984 Long term (current) use of oral hypoglycemic drugs: Secondary | ICD-10-CM | POA: Diagnosis not present

## 2019-12-01 DIAGNOSIS — K208 Other esophagitis without bleeding: Secondary | ICD-10-CM | POA: Diagnosis not present

## 2019-12-01 DIAGNOSIS — Z961 Presence of intraocular lens: Secondary | ICD-10-CM | POA: Diagnosis not present

## 2019-12-01 DIAGNOSIS — Z931 Gastrostomy status: Secondary | ICD-10-CM | POA: Diagnosis not present

## 2019-12-01 DIAGNOSIS — G2581 Restless legs syndrome: Secondary | ICD-10-CM | POA: Insufficient documentation

## 2019-12-01 DIAGNOSIS — Z888 Allergy status to other drugs, medicaments and biological substances status: Secondary | ICD-10-CM | POA: Diagnosis not present

## 2019-12-01 DIAGNOSIS — R1310 Dysphagia, unspecified: Secondary | ICD-10-CM | POA: Diagnosis not present

## 2019-12-01 DIAGNOSIS — I130 Hypertensive heart and chronic kidney disease with heart failure and stage 1 through stage 4 chronic kidney disease, or unspecified chronic kidney disease: Secondary | ICD-10-CM | POA: Diagnosis not present

## 2019-12-01 DIAGNOSIS — Z79899 Other long term (current) drug therapy: Secondary | ICD-10-CM | POA: Diagnosis not present

## 2019-12-01 DIAGNOSIS — N289 Disorder of kidney and ureter, unspecified: Secondary | ICD-10-CM | POA: Diagnosis not present

## 2019-12-01 DIAGNOSIS — I10 Essential (primary) hypertension: Secondary | ICD-10-CM

## 2019-12-01 DIAGNOSIS — I272 Pulmonary hypertension, unspecified: Secondary | ICD-10-CM | POA: Insufficient documentation

## 2019-12-01 DIAGNOSIS — I110 Hypertensive heart disease with heart failure: Secondary | ICD-10-CM | POA: Diagnosis not present

## 2019-12-01 DIAGNOSIS — E1122 Type 2 diabetes mellitus with diabetic chronic kidney disease: Secondary | ICD-10-CM | POA: Diagnosis not present

## 2019-12-01 DIAGNOSIS — M1990 Unspecified osteoarthritis, unspecified site: Secondary | ICD-10-CM | POA: Insufficient documentation

## 2019-12-01 DIAGNOSIS — Z823 Family history of stroke: Secondary | ICD-10-CM | POA: Diagnosis not present

## 2019-12-01 DIAGNOSIS — I5022 Chronic systolic (congestive) heart failure: Secondary | ICD-10-CM | POA: Diagnosis not present

## 2019-12-01 DIAGNOSIS — Z85828 Personal history of other malignant neoplasm of skin: Secondary | ICD-10-CM | POA: Insufficient documentation

## 2019-12-01 DIAGNOSIS — R0601 Orthopnea: Secondary | ICD-10-CM | POA: Diagnosis not present

## 2019-12-01 DIAGNOSIS — D508 Other iron deficiency anemias: Secondary | ICD-10-CM

## 2019-12-01 DIAGNOSIS — K219 Gastro-esophageal reflux disease without esophagitis: Secondary | ICD-10-CM | POA: Insufficient documentation

## 2019-12-01 DIAGNOSIS — I509 Heart failure, unspecified: Secondary | ICD-10-CM | POA: Diagnosis not present

## 2019-12-01 DIAGNOSIS — Z7902 Long term (current) use of antithrombotics/antiplatelets: Secondary | ICD-10-CM | POA: Insufficient documentation

## 2019-12-01 LAB — CBC WITH DIFFERENTIAL/PLATELET
Abs Immature Granulocytes: 0.03 10*3/uL (ref 0.00–0.07)
Basophils Absolute: 0 10*3/uL (ref 0.0–0.1)
Basophils Relative: 0 %
Eosinophils Absolute: 0.4 10*3/uL (ref 0.0–0.5)
Eosinophils Relative: 4 %
HCT: 29.6 % — ABNORMAL LOW (ref 36.0–46.0)
Hemoglobin: 9.2 g/dL — ABNORMAL LOW (ref 12.0–15.0)
Immature Granulocytes: 0 %
Lymphocytes Relative: 7 %
Lymphs Abs: 0.6 10*3/uL — ABNORMAL LOW (ref 0.7–4.0)
MCH: 26.4 pg (ref 26.0–34.0)
MCHC: 31.1 g/dL (ref 30.0–36.0)
MCV: 85.1 fL (ref 80.0–100.0)
Monocytes Absolute: 0.5 10*3/uL (ref 0.1–1.0)
Monocytes Relative: 6 %
Neutro Abs: 7.4 10*3/uL (ref 1.7–7.7)
Neutrophils Relative %: 83 %
Platelets: 359 10*3/uL (ref 150–400)
RBC: 3.48 MIL/uL — ABNORMAL LOW (ref 3.87–5.11)
RDW: 15.9 % — ABNORMAL HIGH (ref 11.5–15.5)
WBC: 8.9 10*3/uL (ref 4.0–10.5)
nRBC: 0 % (ref 0.0–0.2)

## 2019-12-01 LAB — COMPREHENSIVE METABOLIC PANEL
ALT: 21 U/L (ref 0–44)
AST: 17 U/L (ref 15–41)
Albumin: 3.3 g/dL — ABNORMAL LOW (ref 3.5–5.0)
Alkaline Phosphatase: 72 U/L (ref 38–126)
Anion gap: 12 (ref 5–15)
BUN: 84 mg/dL — ABNORMAL HIGH (ref 8–23)
CO2: 28 mmol/L (ref 22–32)
Calcium: 8.5 mg/dL — ABNORMAL LOW (ref 8.9–10.3)
Chloride: 99 mmol/L (ref 98–111)
Creatinine, Ser: 1.43 mg/dL — ABNORMAL HIGH (ref 0.44–1.00)
GFR calc Af Amer: 38 mL/min — ABNORMAL LOW (ref >60)
GFR calc non Af Amer: 33 mL/min — ABNORMAL LOW (ref >60)
Glucose, Bld: 161 mg/dL — ABNORMAL HIGH (ref 70–99)
Potassium: 4.1 mmol/L (ref 3.5–5.1)
Sodium: 139 mmol/L (ref 135–145)
Total Bilirubin: 0.6 mg/dL (ref 0.3–1.2)
Total Protein: 6.2 g/dL — ABNORMAL LOW (ref 6.5–8.1)

## 2019-12-01 LAB — IRON AND TIBC
Iron: 22 ug/dL — ABNORMAL LOW (ref 28–170)
Saturation Ratios: 6 % — ABNORMAL LOW (ref 10.4–31.8)
TIBC: 371 ug/dL (ref 250–450)
UIBC: 349 ug/dL

## 2019-12-01 LAB — GLUCOSE, CAPILLARY: Glucose-Capillary: 126 mg/dL — ABNORMAL HIGH (ref 70–99)

## 2019-12-01 LAB — URIC ACID: Uric Acid, Serum: 7.9 mg/dL — ABNORMAL HIGH (ref 2.5–7.1)

## 2019-12-01 NOTE — Patient Instructions (Signed)
Continue weighing daily and call for an overnight weight gain of > 2 pounds or a weekly weight gain of >5 pounds. 

## 2019-12-02 ENCOUNTER — Other Ambulatory Visit: Payer: Medicare Other

## 2019-12-03 ENCOUNTER — Inpatient Hospital Stay (HOSPITAL_BASED_OUTPATIENT_CLINIC_OR_DEPARTMENT_OTHER): Payer: Medicare Other | Admitting: Hematology and Oncology

## 2019-12-03 ENCOUNTER — Other Ambulatory Visit: Payer: Self-pay

## 2019-12-03 ENCOUNTER — Telehealth: Payer: Self-pay

## 2019-12-03 ENCOUNTER — Inpatient Hospital Stay: Payer: Medicare Other

## 2019-12-03 ENCOUNTER — Encounter: Payer: Self-pay | Admitting: Hematology and Oncology

## 2019-12-03 VITALS — BP 114/59 | HR 50 | Temp 97.5°F | Resp 18 | Wt 122.8 lb

## 2019-12-03 DIAGNOSIS — D509 Iron deficiency anemia, unspecified: Secondary | ICD-10-CM

## 2019-12-03 DIAGNOSIS — N289 Disorder of kidney and ureter, unspecified: Secondary | ICD-10-CM

## 2019-12-03 MED ORDER — IRON SUCROSE 20 MG/ML IV SOLN
200.0000 mg | Freq: Once | INTRAVENOUS | Status: AC
  Administered 2019-12-03: 200 mg via INTRAVENOUS

## 2019-12-03 MED ORDER — SODIUM CHLORIDE 0.9 % IV SOLN
Freq: Once | INTRAVENOUS | Status: AC
  Filled 2019-12-03: qty 250

## 2019-12-03 NOTE — Patient Instructions (Signed)

## 2019-12-03 NOTE — Telephone Encounter (Signed)
Contacted Dr. Bethanne Ginger office. Spoke with Gwinda Passe Gwinda Passe to send message to nurse) and informed her of patient's bradycardia while in office. Gwinda Passe made aware that patient is asymptomatic. Number provided should nurse have any questions.

## 2019-12-03 NOTE — Progress Notes (Signed)
Pt here for follow up. Reports she has been having issues epistaxis after blowing her nose. Reports there is a scab present in the right nostril but bleeding comes from both sides. Denies any other concerns.

## 2019-12-08 NOTE — Progress Notes (Signed)
This encounter was created in error - please disregard.

## 2019-12-11 ENCOUNTER — Inpatient Hospital Stay: Payer: Medicare Other

## 2019-12-11 ENCOUNTER — Other Ambulatory Visit: Payer: Self-pay

## 2019-12-11 VITALS — BP 147/56 | HR 62 | Temp 96.7°F | Resp 18

## 2019-12-11 DIAGNOSIS — D509 Iron deficiency anemia, unspecified: Secondary | ICD-10-CM

## 2019-12-11 MED ORDER — IRON SUCROSE 20 MG/ML IV SOLN
200.0000 mg | Freq: Once | INTRAVENOUS | Status: AC
  Administered 2019-12-11: 200 mg via INTRAVENOUS

## 2019-12-11 MED ORDER — SODIUM CHLORIDE 0.9 % IV SOLN
Freq: Once | INTRAVENOUS | Status: AC
  Filled 2019-12-11: qty 250

## 2019-12-11 NOTE — Patient Instructions (Signed)

## 2019-12-18 ENCOUNTER — Inpatient Hospital Stay: Payer: Medicare Other

## 2019-12-18 ENCOUNTER — Telehealth (INDEPENDENT_AMBULATORY_CARE_PROVIDER_SITE_OTHER): Payer: Self-pay

## 2019-12-18 ENCOUNTER — Other Ambulatory Visit: Payer: Self-pay

## 2019-12-18 VITALS — BP 134/53 | HR 63 | Temp 96.4°F | Resp 18

## 2019-12-18 DIAGNOSIS — D509 Iron deficiency anemia, unspecified: Secondary | ICD-10-CM

## 2019-12-18 MED ORDER — IRON SUCROSE 20 MG/ML IV SOLN
200.0000 mg | Freq: Once | INTRAVENOUS | Status: AC
  Administered 2019-12-18: 200 mg via INTRAVENOUS

## 2019-12-18 MED ORDER — SODIUM CHLORIDE 0.9 % IV SOLN
Freq: Once | INTRAVENOUS | Status: AC
  Filled 2019-12-18: qty 250

## 2019-12-18 NOTE — Telephone Encounter (Signed)
It is not very likely that the covid shot would cause a sudden circulation issue.  Also, itching is not a typical symptom of issues with circulation, if anything that would be nerve related which would be better assessed by her PCP.  Knee pain is also not consistent with circulation issues and this too would be better assessed by her PCP. In fact, if she hasn't discussed this with her PCP I would be sure to touch base with them first.  The patient has two studies scheduled for 04/30.  I'm not sure if we would be able to move up both ultrasounds before 03/31.    So in this instance there are two options:  We can see what (if anything) is available to bring the patient in sooner, however this would likely be with me and not with Dr. Lucky Cowboy due to his schedule constraints.  If they are ok with that we can see what is available.    If they don't want to follow up with me, we can keep the 04/30 date but bring her in for ABIs only before then.  If her studies are drastically decreased then we can try to work her in to be seen but if not she will just follow up on 04/30 with Dr. Lucky Cowboy.    Lastly, If none of this works for them scheduling wise, we can keep her 04/30 appointment

## 2019-12-18 NOTE — Telephone Encounter (Signed)
A message has been left on the patient daughter voicemail to return a call to the office

## 2019-12-18 NOTE — Patient Instructions (Signed)

## 2019-12-18 NOTE — Telephone Encounter (Signed)
Patient daughter called and her mother has been schedule to come in 12/23/19

## 2019-12-18 NOTE — Telephone Encounter (Signed)
Patient daughter has been made aware with medical advice and verbalized understanding

## 2019-12-21 ENCOUNTER — Other Ambulatory Visit (INDEPENDENT_AMBULATORY_CARE_PROVIDER_SITE_OTHER): Payer: Self-pay | Admitting: Vascular Surgery

## 2019-12-21 DIAGNOSIS — K551 Chronic vascular disorders of intestine: Secondary | ICD-10-CM

## 2019-12-23 ENCOUNTER — Ambulatory Visit (INDEPENDENT_AMBULATORY_CARE_PROVIDER_SITE_OTHER): Payer: Medicare Other | Admitting: Nurse Practitioner

## 2019-12-23 ENCOUNTER — Other Ambulatory Visit: Payer: Self-pay

## 2019-12-23 ENCOUNTER — Encounter (INDEPENDENT_AMBULATORY_CARE_PROVIDER_SITE_OTHER): Payer: Self-pay | Admitting: Nurse Practitioner

## 2019-12-23 ENCOUNTER — Ambulatory Visit (INDEPENDENT_AMBULATORY_CARE_PROVIDER_SITE_OTHER): Payer: Medicare Other

## 2019-12-23 VITALS — BP 129/58 | HR 59 | Resp 14 | Wt 115.4 lb

## 2019-12-23 DIAGNOSIS — K559 Vascular disorder of intestine, unspecified: Secondary | ICD-10-CM | POA: Diagnosis not present

## 2019-12-23 DIAGNOSIS — I10 Essential (primary) hypertension: Secondary | ICD-10-CM

## 2019-12-23 DIAGNOSIS — K551 Chronic vascular disorders of intestine: Secondary | ICD-10-CM | POA: Diagnosis not present

## 2019-12-23 DIAGNOSIS — I739 Peripheral vascular disease, unspecified: Secondary | ICD-10-CM | POA: Diagnosis not present

## 2019-12-23 NOTE — Progress Notes (Signed)
SUBJECTIVE:  Patient ID: [name] [name], female    DOB: 04/11/1929, 86 y.o.   MRN: [id number] Chief Complaint  Patient presents with  . Follow-up    ultrasound follow up    HPI  [name] is a 86 y.o. female that presents today after her daughter was concerned about patient's knee pain as well as itching feet after COVID-19 evaluation.  The patient states that this happened after her COVID-19 vaccination and she heard of possible blood clots after vaccination and that that this could be symptoms related to that.  The patient does have a previous history of mesenteric ischemia as well as peripheral arterial disease.  Lately patient denies any change in symptoms.  Such as any increased claudication-like symptoms or lower extremity ulcerations.  However it is noted that the patient does not walk greatly.  The patient denies any abdominal pain or food phobia however she is also maintained via a feeding tube due to an esophageal stricture.  She denies any nausea or vomiting.  Today noninvasive studies show noncompressible bilateral ABIs.  The TBI is 0.22 bilaterally.  The patient has monophasic waveforms in the anterior tibial artery and peroneal artery bilaterally.  The posterior tibial artery bilaterally is not detected.  The patient has strong waveforms in the left foot however dampened on the right.  Previous studies in 2018 show a right ABI 1.06 with a left of 1.3.  The digit waveform was 0.24.  The right lower extremity had biphasic waveforms in the left had biphasic/monophasic waveforms.  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  However, compared to the previous study done on 05/20/2019 the velocities in the SMA appeared to be decreased whereas velocities in the celiac artery remain elevated.  Proximal stent in the SMA is patent. Past Medical History:  Diagnosis Date  . Anemia     IRON INFUSIONS  . Aortic valve sclerosis   . Arrhythmia   . Arthritis    osteoarthritis  . Basal cell carcinoma of skin   . Brain tumor ([place])   . Brain tumor ([place])   . Cervical spine disease   . CHF (congestive heart failure) ([place])   . Diabetes mellitus without complication ([place])   . Dysrhythmia    sinus arrhythmia  . Esophageal stricture    severe, led to feeding tube placement in Sept 2019  . Esophageal ulcer without bleeding   . Gastrostomy tube dependent ([place])    DOES NOT EAT OR DRINK   . GERD (gastroesophageal reflux disease)   . History of kidney stones   . Hyperlipemia   . Hypertension   . Kidney stones   . Leaky heart valve   . Meningioma ([place])   . Occlusive mesenteric ischemia ([place])   . Pulmonary hypertension ([place])   . RLS (restless legs syndrome)   . Vertigo     Past Surgical History:  Procedure Laterality Date  . ABDOMINAL HYSTERECTOMY    . APPENDECTOMY    . ARTHROGRAM KNEE Left   . BACK SURGERY     CERVIVAL NECK FUSION  . CATARACT EXTRACTION W/PHACO Left 11/11/2018   Procedure: CATARACT EXTRACTION PHACO AND INTRAOCULAR LENS PLACEMENT (IOC) LEFT, DIABETIC;  Surgeon: Birder Robson, MD;  Location: [facility] ORS;  Service: Ophthalmology;  Laterality: Left;  [place]  00:41 CDE 6.41 Fluid pack lot # [id number]  . COLONOSCOPY WITH PROPOFOL  N/A 06/20/2017   Procedure: COLONOSCOPY WITH PROPOFOL;  Surgeon: Lollie Sails, MD;  Location: [facility] ENDOSCOPY;  Service: Endoscopy;  Laterality: N/A;  . ESOPHAGOGASTRODUODENOSCOPY (EGD) WITH PROPOFOL N/A 03/14/2015   Procedure: ESOPHAGOGASTRODUODENOSCOPY (EGD) WITH PROPOFOL;  Surgeon: Josefine Class, MD;  Location: [facility] ENDOSCOPY;  Service: Endoscopy;  Laterality: N/A;  . ESOPHAGOGASTRODUODENOSCOPY (EGD) WITH PROPOFOL N/A 03/28/2017   Procedure: ESOPHAGOGASTRODUODENOSCOPY (EGD) WITH PROPOFOL;  Surgeon: Lollie Sails, MD;  Location: [facility] ENDOSCOPY;  Service: Endoscopy;  Laterality: N/A;  . ESOPHAGOGASTRODUODENOSCOPY (EGD)  WITH PROPOFOL N/A 06/20/2017   Procedure: ESOPHAGOGASTRODUODENOSCOPY (EGD) WITH PROPOFOL;  Surgeon: Lollie Sails, MD;  Location: [facility] ENDOSCOPY;  Service: Endoscopy;  Laterality: N/A;  . ESOPHAGOGASTRODUODENOSCOPY (EGD) WITH PROPOFOL N/A 10/21/2017   Procedure: ESOPHAGOGASTRODUODENOSCOPY (EGD) WITH PROPOFOL;  Surgeon: Lollie Sails, MD;  Location: [facility] ENDOSCOPY;  Service: Endoscopy;  Laterality: N/A;  . ESOPHAGOGASTRODUODENOSCOPY (EGD) WITH PROPOFOL N/A 04/15/2018   Procedure: ESOPHAGOGASTRODUODENOSCOPY (EGD) WITH PROPOFOL;  Surgeon: Lollie Sails, MD;  Location: [facility] ENDOSCOPY;  Service: Endoscopy;  Laterality: N/A;  . ESOPHAGUS SURGERY     CLOSURE  . EYE SURGERY    . HYSTERECTOMY ABDOMINAL WITH SALPINGECTOMY    . IR GASTROSTOMY TUBE MOD SED  06/11/2018  . IR GASTROSTOMY TUBE REMOVAL  03/25/2019  . PERIPHERAL VASCULAR CATHETERIZATION N/A 01/23/2016   Procedure: Visceral Venography;  Surgeon: Algernon Huxley, MD;  Location: [place] CV LAB;  Service: Cardiovascular;  Laterality: N/A;  . PERIPHERAL VASCULAR CATHETERIZATION  01/23/2016   Procedure: Peripheral Vascular Intervention;  Surgeon: Algernon Huxley, MD;  Location: [place] CV LAB;  Service: Cardiovascular;;  . UPPER ESOPHAGEAL ENDOSCOPIC ULTRASOUND (EUS) N/A 12/05/2017   Procedure: UPPER ESOPHAGEAL ENDOSCOPIC ULTRASOUND (EUS);  Surgeon: Jola Schmidt, MD;  Location: [facility] ENDOSCOPY;  Service: Endoscopy;  Laterality: N/A;  . VISCERAL ANGIOGRAPHY N/A 03/18/2017   Procedure: Visceral Angiography;  Surgeon: Algernon Huxley, MD;  Location: [place] CV LAB;  Service: Cardiovascular;  Laterality: N/A;  . VISCERAL ARTERY INTERVENTION N/A 03/18/2017   Procedure: Visceral Artery Intervention;  Surgeon: Algernon Huxley, MD;  Location: [place] CV LAB;  Service: Cardiovascular;  Laterality: N/A;    Social History   Socioeconomic History  . Marital status: Widowed    Spouse name: Not on file  . Number of children: Not on file  .  Years of education: Not on file  . Highest education level: Not on file  Occupational History  . Not on file  Tobacco Use  . Smoking status: Never Smoker  . Smokeless tobacco: Never Used  Substance and Sexual Activity  . Alcohol use: Never  . Drug use: Never  . Sexual activity: Not Currently  Other Topics Concern  . Not on file  Social History Narrative   Lives alone.    Daughter helps her.    Social Determinants of Health   Financial Resource Strain:   . Difficulty of Paying Living Expenses:   Food Insecurity:   . Worried About Charity fundraiser in the Last Year:   . Arboriculturist in the Last Year:   Transportation Needs:   . Film/video editor (Medical):   Marland Kitchen Lack of Transportation (Non-Medical):   Physical Activity:   . Days of Exercise per Week:   . Minutes of Exercise per Session:   Stress:   . Feeling of Stress :   Social Connections:   . Frequency of Communication with Friends and Family:   . Frequency of Social  Gatherings with Friends and Family:   . Attends Religious Services:   . Active Member of Clubs or Organizations:   . Attends Archivist Meetings:   Marland Kitchen Marital Status:   Intimate Partner Violence:   . Fear of Current or Ex-Partner:   . Emotionally Abused:   Marland Kitchen Physically Abused:   . Sexually Abused:     Family History  Problem Relation Age of Onset  . Stroke Mother   . Hypertension Mother   . Heart disease Mother   . Cancer Father   . Heart disease Father   . Heart attack Sister   . Diabetes Sister   . Heart attack Brother     Allergies  Allergen Reactions  . Gabapentin Swelling  . Lipitor [Atorvastatin] Other (See Comments)    Muscle aches  . Mevacor [Lovastatin] Other (See Comments)    Muscle aches  . Milk-Related Compounds Other (See Comments)    Large quantities cause headaches   . Septra [Sulfamethoxazole-Trimethoprim] Other (See Comments)    Unknown  . Statins Other (See Comments)    Muscle pain  . Sulfur  Diarrhea and Nausea And Vomiting  . Zocor [Simvastatin] Other (See Comments)    Muscle aches     Review of Systems   Review of Systems: Negative Unless Checked Constitutional: [] Weight loss  [] Fever  [] Chills Cardiac: [] Chest pain   []  Atrial Fibrillation  [] Palpitations   [] Shortness of breath when laying flat   [] Shortness of breath with exertion. [] Shortness of breath at rest Vascular:  [] Pain in legs with walking   [] Pain in legs with standing [] Pain in legs when laying flat   [] Claudication    [] Pain in feet when laying flat    [] History of DVT   [] Phlebitis   [] Swelling in legs   [x] Varicose veins   [] Non-healing ulcers Pulmonary:   [] Uses home oxygen   [] Productive cough   [] Hemoptysis   [] Wheeze  [] COPD   [] Asthma Neurologic:  [] Dizziness   [] Seizures  [] Blackouts [] History of stroke   [] History of TIA  [] Aphasia   [] Temporary Blindness   [] Weakness or numbness in arm   [] Weakness or numbness in leg Musculoskeletal:   [] Joint swelling   [] Joint pain   [] Low back pain  []  History of Knee Replacement [] Arthritis [] back Surgeries  []  Spinal Stenosis    Hematologic:  [] Easy bruising  [] Easy bleeding   [] Hypercoagulable state   [x] Anemic Gastrointestinal:  [x] Diarrhea   [] Vomiting  [x] Gastroesophageal reflux/heartburn   [] Difficulty swallowing. [] Abdominal pain Genitourinary:  [] Chronic kidney disease   [] Difficult urination  [] Anuric   [] Blood in urine [] Frequent urination  [] Burning with urination   [] Hematuria Skin:  [] Rashes   [] Ulcers [] Wounds Psychological:  [] History of anxiety   []  History of major depression  []  Memory Difficulties      OBJECTIVE:   Physical Exam  BP (!) 129/58 (BP Location: Right Arm)   Pulse (!) 59   Resp 14   Wt 115 lb 6.4 oz (52.3 kg)   BMI 20.44 kg/m   Gen: WD/WN, NAD Head: [place]/AT, No temporalis wasting.  Ear/Nose/Throat: Hearing grossly intact, nares w/o erythema or drainage Eyes: PER, EOMI, sclera nonicteric.  Neck: Supple, no masses.  No JVD.    Pulmonary:  Good air movement, no use of accessory muscles.  Cardiac: RRR Vascular:  Negative Homans' sign Vessel Right Left  Radial Palpable Palpable  Dorsalis Pedis  not palpable  not palpable  Posterior Tibial  not palpable  not palpable  Gastrointestinal: soft, non-distended. No guarding/no peritoneal signs.  Musculoskeletal: M/S 5/5 throughout.  No deformity or atrophy.  Neurologic: Pain and light touch intact in extremities.  Symmetrical.  Speech is fluent. Motor exam as listed above. Psychiatric: Judgment intact, Mood & affect appropriate for pt's clinical situation. Dermatologic: No Venous rashes. No Ulcers Noted.  No changes consistent with cellulitis. Lymph : No Cervical lymphadenopathy, no lichenification or skin changes of chronic lymphedema.       ASSESSMENT AND PLAN:  1. PAD (peripheral artery disease) ([place]) Today the patient has some worsening studies from what was seen in 2018.  However, the patient denies being very symptomatic.  I discussed angiogram with the patient and her daughter versus conservatively watching for signs and symptoms that intervention is absolutely necessary.  After discussion with the patient and her daughter she wishes to continue with close follow-up.  Patient and daughter advised to contact her office if lower extremities are suddenly cold, painful or develop wounds.  Otherwise, patient will follow up in 6 months with noninvasive studies.  Patient will continue with current anticoagulation. 2. Mesenteric ischemia ([facility]) Today the patient denies any complaints that are generally consistent with worsening mesenteric ischemia.  In fact her velocities from her noninvasive studies are improved.  However the fact that the patient is also utilizing tube feeds makes it somewhat difficult to ascertain whether she is having true mesenteric ischemia symptoms.  Today the patient's velocities are actually somewhat improved.  We will continue on a 61-month follow-up  visit  3. Essential hypertension Continue antihypertensive medications as already ordered, these medications have been reviewed and there are no changes at this time.    Current Outpatient Medications on File Prior to Visit  Medication Sig Dispense Refill  . acetaminophen (TYLENOL) 160 MG/5ML solution Place 20.3 mLs (650 mg total) into feeding tube every 6 (six) hours as needed for mild pain. 120 mL 0  . albuterol (VENTOLIN HFA) 108 (90 Base) MCG/ACT inhaler Inhale 2 puffs into the lungs every 4 (four) hours as needed for shortness of breath or wheezing.    Marland Kitchen allopurinol (ZYLOPRIM) 100 MG tablet Place 100 mg into feeding tube daily.    . Amino Acids-Protein Hydrolys (FEEDING SUPPLEMENT, PRO-STAT SUGAR FREE 64,) LIQD Place 30 mLs into feeding tube daily. 450 mL 1  . clopidogrel (PLAVIX) 75 MG tablet TAKE 1 TABLET BY MOUTH ONCE DAILY (Patient taking differently: Place 75 mg into feeding tube daily. ) 30 tablet 2  . diltiazem (CARDIZEM) 120 MG tablet Take 120 mg by mouth daily.     . furosemide (LASIX) 10 MG/ML solution Place 60 mg into feeding tube daily.     Marland Kitchen glipiZIDE (GLUCOTROL) 5 MG tablet Place 1 tablet (5 mg total) into feeding tube daily before breakfast.    . hydrALAZINE (APRESOLINE) 50 MG tablet Place 1 tablet (50 mg total) into feeding tube 2 (two) times daily.    . Hypromellose (GENTEAL MILD) 0.2 % SOLN Place 1 drop into both eyes 3 (three) times daily as needed (dry eyes).     Marland Kitchen lisinopril (ZESTRIL) 40 MG tablet Take 20 mg by mouth daily.     Marland Kitchen loperamide (IMODIUM) 1 MG/5ML solution Take 3 mg by mouth as needed for diarrhea or loose stools.    . meclizine (ANTIVERT) 25 MG tablet Place 25 mg into feeding tube as needed for dizziness.     . metoprolol succinate (TOPROL-XL) 50 MG 24 hr tablet Take 50 mg by mouth daily.    Marland Kitchen  metoprolol tartrate (LOPRESSOR) 25 MG tablet Take 1 tablet (25 mg total) by mouth 2 (two) times daily. 60 tablet 0  . Multiple Vitamin (MULTIVITAMIN) LIQD Place 15  mLs into feeding tube daily. 450 mL 1  . Nutritional Supplements (FEEDING SUPPLEMENT, JEVITY 1.5 CAL/FIBER,) LIQD Place 237 mLs into feeding tube 4 (four) times daily. 237 mL 90  . pantoprazole sodium (PROTONIX) 40 mg/20 mL PACK Place 20 mLs (40 mg total) into feeding tube 2 (two) times daily. 30 each 1  . rOPINIRole (REQUIP) 2 MG tablet Place 1 tablet (2 mg total) into feeding tube 2 (two) times daily. 60 tablet 1  . spironolactone (ALDACTONE) 25 MG tablet Take 25 mg by mouth daily.    . vitamin C (VITAMIN C) 250 MG tablet Place 1 tablet (250 mg total) into feeding tube 2 (two) times daily. 60 tablet 1  . Water For Irrigation, Sterile (FREE WATER) SOLN Place 30 mLs into feeding tube every 4 (four) hours.     No current facility-administered medications on file prior to visit.    There are no Patient Instructions on file for this visit. No follow-ups on file.   Kris Hartmann, NP  This note was completed with Sales executive.  Any errors are purely unintentional.

## 2020-01-12 ENCOUNTER — Other Ambulatory Visit (INDEPENDENT_AMBULATORY_CARE_PROVIDER_SITE_OTHER): Payer: Self-pay | Admitting: Nurse Practitioner

## 2020-01-12 ENCOUNTER — Telehealth (INDEPENDENT_AMBULATORY_CARE_PROVIDER_SITE_OTHER): Payer: Self-pay

## 2020-01-12 DIAGNOSIS — I739 Peripheral vascular disease, unspecified: Secondary | ICD-10-CM

## 2020-01-12 MED ORDER — CLOPIDOGREL BISULFATE 75 MG PO TABS: 75.0000 mg | ORAL_TABLET | Freq: Every day | ORAL | 6 refills | Status: DC

## 2020-01-12 NOTE — Telephone Encounter (Signed)
Called pt and made her aware of the refill

## 2020-01-12 NOTE — Telephone Encounter (Signed)
Pt's daughter called saying she needs a refill  On clopidogrel 75 mg  Sent to the [facility] in [place].

## 2020-01-12 NOTE — Telephone Encounter (Signed)
done

## 2020-01-20 NOTE — Progress Notes (Signed)
Patient ID: [name], female    DOB: [birth year], 86 y.o.   MRN: [id number]  HPI  [name] is a 86 y/o female with a history of DM, hyperlipidemia, HTN, CKD, brain tumor, GERD, pulmonary HTN, anemia, G-tube and chronic heart failure.   Echo report from 11/24/19 reviewed and showed an EF of 20-25% along with severe MR, mod/severe TR, mild AR, moderately elevated PA pressure and large pleural effusion on the left.   Admitted 11/23/19 due to acute on chronic HF. Cardiology consult obtained. Patient had stopped her diuretics. Initially needed bipap but then quickly weaned to room air. Elevated troponins thought to be due to demand ischemia. Initially given IV lasix and then transitioned to oral diuretics. Discharged after 3 days.   She presents today for a follow-up visit with a chief complaint of moderate fatigue upon minimal exertion. She describes this as chronic in nature having been present for several years. She has associated shortness of breath, palpitations, abdominal pain, light-headedness, easy bruising, difficulty sleeping and weight loss along with this. She denies any abdominal distention, pedal edema, chest pain or cough.   Says that she's having pain mid-upper abdomen any time she swallows her saliva. She used to be able to swallow small bites of egg, coffee etc but now she can't do that. She says that her current tube feeds also cause diarrhea every time she uses it.    Past Medical History:  Diagnosis Date  . Anemia    IRON INFUSIONS  . Aortic valve sclerosis   . Arrhythmia   . Arthritis    osteoarthritis  . Basal cell carcinoma of skin   . Brain tumor ([place])   . Brain tumor ([place])   . Cervical spine disease   . CHF (congestive heart failure) ([place])   . Diabetes mellitus without complication ([place])   . Dysrhythmia    sinus arrhythmia  . Esophageal stricture    severe, led to feeding tube placement in Sept 2019  . Esophageal ulcer without bleeding   . Gastrostomy  tube dependent ([place])    DOES NOT EAT OR DRINK   . GERD (gastroesophageal reflux disease)   . History of kidney stones   . Hyperlipemia   . Hypertension   . Kidney stones   . Leaky heart valve   . Meningioma ([place])   . Occlusive mesenteric ischemia ([place])   . Pulmonary hypertension ([place])   . RLS (restless legs syndrome)   . Vertigo    Past Surgical History:  Procedure Laterality Date  . ABDOMINAL HYSTERECTOMY    . APPENDECTOMY    . ARTHROGRAM KNEE Left   . BACK SURGERY     CERVIVAL NECK FUSION  . CATARACT EXTRACTION W/PHACO Left 11/11/2018   Procedure: CATARACT EXTRACTION PHACO AND INTRAOCULAR LENS PLACEMENT (IOC) LEFT, DIABETIC;  Surgeon: Birder Robson, MD;  Location: [facility] ORS;  Service: Ophthalmology;  Laterality: Left;  [place]  00:41 CDE 6.41 Fluid pack lot # [id number]  . COLONOSCOPY WITH PROPOFOL N/A 06/20/2017   Procedure: COLONOSCOPY WITH PROPOFOL;  Surgeon: Lollie Sails, MD;  Location: [facility] ENDOSCOPY;  Service: Endoscopy;  Laterality: N/A;  . ESOPHAGOGASTRODUODENOSCOPY (EGD) WITH PROPOFOL N/A 03/14/2015   Procedure: ESOPHAGOGASTRODUODENOSCOPY (EGD) WITH PROPOFOL;  Surgeon: Josefine Class, MD;  Location: [facility] ENDOSCOPY;  Service: Endoscopy;  Laterality: N/A;  . ESOPHAGOGASTRODUODENOSCOPY (EGD) WITH PROPOFOL N/A 03/28/2017   Procedure: ESOPHAGOGASTRODUODENOSCOPY (EGD) WITH PROPOFOL;  Surgeon: Lollie Sails, MD;  Location: [facility] ENDOSCOPY;  Service: Endoscopy;  Laterality: N/A;  . ESOPHAGOGASTRODUODENOSCOPY (EGD) WITH PROPOFOL N/A 06/20/2017   Procedure: ESOPHAGOGASTRODUODENOSCOPY (EGD) WITH PROPOFOL;  Surgeon: Lollie Sails, MD;  Location: [facility] ENDOSCOPY;  Service: Endoscopy;  Laterality: N/A;  . ESOPHAGOGASTRODUODENOSCOPY (EGD) WITH PROPOFOL N/A 10/21/2017   Procedure: ESOPHAGOGASTRODUODENOSCOPY (EGD) WITH PROPOFOL;  Surgeon: Lollie Sails, MD;  Location: [facility] ENDOSCOPY;  Service: Endoscopy;  Laterality: N/A;  . ESOPHAGOGASTRODUODENOSCOPY (EGD) WITH PROPOFOL  N/A 04/15/2018   Procedure: ESOPHAGOGASTRODUODENOSCOPY (EGD) WITH PROPOFOL;  Surgeon: Lollie Sails, MD;  Location: [facility] ENDOSCOPY;  Service: Endoscopy;  Laterality: N/A;  . ESOPHAGUS SURGERY     CLOSURE  . EYE SURGERY    . HYSTERECTOMY ABDOMINAL WITH SALPINGECTOMY    . IR GASTROSTOMY TUBE MOD SED  06/11/2018  . IR GASTROSTOMY TUBE REMOVAL  03/25/2019  . PERIPHERAL VASCULAR CATHETERIZATION N/A 01/23/2016   Procedure: Visceral Venography;  Surgeon: Algernon Huxley, MD;  Location: [place] CV LAB;  Service: Cardiovascular;  Laterality: N/A;  . PERIPHERAL VASCULAR CATHETERIZATION  01/23/2016   Procedure: Peripheral Vascular Intervention;  Surgeon: Algernon Huxley, MD;  Location: [place] CV LAB;  Service: Cardiovascular;;  . UPPER ESOPHAGEAL ENDOSCOPIC ULTRASOUND (EUS) N/A 12/05/2017   Procedure: UPPER ESOPHAGEAL ENDOSCOPIC ULTRASOUND (EUS);  Surgeon: Jola Schmidt, MD;  Location: [facility] ENDOSCOPY;  Service: Endoscopy;  Laterality: N/A;  . VISCERAL ANGIOGRAPHY N/A 03/18/2017   Procedure: Visceral Angiography;  Surgeon: Algernon Huxley, MD;  Location: [place] CV LAB;  Service: Cardiovascular;  Laterality: N/A;  . VISCERAL ARTERY INTERVENTION N/A 03/18/2017   Procedure: Visceral Artery Intervention;  Surgeon: Algernon Huxley, MD;  Location: [place] CV LAB;  Service: Cardiovascular;  Laterality: N/A;   Family History  Problem Relation Age of Onset  . Stroke Mother   . Hypertension Mother   . Heart disease Mother   . Cancer Father   . Heart disease Father   . Heart attack Sister   . Diabetes Sister   . Heart attack Brother    Social History   Tobacco Use  . Smoking status: Never Smoker  . Smokeless tobacco: Never Used  Substance Use Topics  . Alcohol use: Never   Allergies  Allergen Reactions  . Gabapentin Swelling  . Lipitor [Atorvastatin] Other (See Comments)    Muscle aches  . Mevacor [Lovastatin] Other (See Comments)    Muscle aches  . Milk-Related Compounds Other (See  Comments)    Large quantities cause headaches   . Septra [Sulfamethoxazole-Trimethoprim] Other (See Comments)    Unknown  . Statins Other (See Comments)    Muscle pain  . Sulfur Diarrhea and Nausea And Vomiting  . Zocor [Simvastatin] Other (See Comments)    Muscle aches   Prior to Admission medications   Medication Sig Start Date End Date Taking? Authorizing Provider  acetaminophen (TYLENOL) 160 MG/5ML solution Place 20.3 mLs (650 mg total) into feeding tube every 6 (six) hours as needed for mild pain. 06/13/18  Yes Demetrios Loll, MD  allopurinol (ZYLOPRIM) 100 MG tablet Place 100 mg into feeding tube daily.   Yes [provider]  Amino Acids-Protein Hydrolys (FEEDING SUPPLEMENT, PRO-STAT SUGAR FREE 64,) LIQD Place 30 mLs into feeding tube daily. 06/13/18  Yes Demetrios Loll, MD  carvedilol (COREG) 6.25 MG tablet Take 6.25 mg by mouth 2 (two) times daily with a meal.   Yes [provider]  clopidogrel (PLAVIX) 75 MG tablet Place 1 tablet (75 mg total) into feeding tube daily. 01/12/20  Yes Kris Hartmann, NP  diltiazem (  CARDIZEM) 120 MG tablet Take 120 mg by mouth daily.  08/16/19  Yes [provider]  furosemide (LASIX) 10 MG/ML solution Place 60 mg into feeding tube daily.    Yes [provider]  glipiZIDE (GLUCOTROL) 5 MG tablet Place 1 tablet (5 mg total) into feeding tube daily before breakfast. 06/13/18  Yes Demetrios Loll, MD  hydrALAZINE (APRESOLINE) 50 MG tablet Place 1 tablet (50 mg total) into feeding tube 2 (two) times daily. 06/13/18  Yes Demetrios Loll, MD  Hypromellose [facility] MILD) 0.2 % SOLN Place 1 drop into both eyes 3 (three) times daily as needed (dry eyes).    Yes [provider]  lisinopril (ZESTRIL) 40 MG tablet Take 20 mg by mouth daily.    Yes [provider]  loperamide (IMODIUM) 1 MG/5ML solution Take 3 mg by mouth as needed for diarrhea or loose stools.   Yes [provider]  meclizine (ANTIVERT) 25 MG tablet Place 25  mg into feeding tube as needed for dizziness.    Yes [provider]  Multiple Vitamin (MULTIVITAMIN) LIQD Place 15 mLs into feeding tube daily. 06/13/18  Yes Demetrios Loll, MD  Nutritional Supplements (FEEDING SUPPLEMENT, JEVITY 1.5 CAL/FIBER,) LIQD Place 237 mLs into feeding tube 4 (four) times daily. 06/25/18  Yes Dustin Flock, MD  pantoprazole sodium (PROTONIX) 40 mg/20 mL PACK Place 20 mLs (40 mg total) into feeding tube 2 (two) times daily. 06/25/18  Yes Dustin Flock, MD  rOPINIRole (REQUIP) 2 MG tablet Place 1 tablet (2 mg total) into feeding tube 2 (two) times daily. 06/13/18  Yes Demetrios Loll, MD  spironolactone (ALDACTONE) 25 MG tablet Take 25 mg by mouth daily. 12/04/19  Yes [provider]  vitamin C (VITAMIN C) 250 MG tablet Place 1 tablet (250 mg total) into feeding tube 2 (two) times daily. 06/13/18  Yes Demetrios Loll, MD  Water For Irrigation, Sterile (FREE WATER) SOLN Place 30 mLs into feeding tube every 4 (four) hours. 06/25/18  Yes Dustin Flock, MD     Review of Systems  Constitutional: Positive for fatigue. Negative for appetite change.  HENT: Positive for congestion. Negative for postnasal drip and sore throat.   Eyes: Negative.   Respiratory: Positive for shortness of breath. Negative for cough and chest tightness.   Cardiovascular: Positive for palpitations. Negative for chest pain and leg swelling.  Gastrointestinal: Positive for abdominal pain (sometimes after swallowing saliva) and diarrhea (after tube feeds). Negative for abdominal distention.  Endocrine: Negative.   Genitourinary: Negative.   Musculoskeletal: Positive for back pain. Negative for neck pain.  Skin: Negative.   Allergic/Immunologic: Negative.   Neurological: Positive for light-headedness. Negative for dizziness.  Hematological: Negative for adenopathy. Bruises/bleeds easily.  Psychiatric/Behavioral: Positive for sleep disturbance (sleeping on 3 pillows). Negative for dysphoric mood. The  patient is not nervous/anxious.    Vitals:   01/21/20 0847 01/21/20 0909  BP: (!) 129/36 (!) 125/40  Pulse: (!) 50   Resp: 16   SpO2: 100%   Weight: 113 lb (51.3 kg)   Height: 5\' 3"  (1.6 m)    Wt Readings from Last 3 Encounters:  01/21/20 113 lb (51.3 kg)  12/23/19 115 lb 6.4 oz (52.3 kg)  12/03/19 122 lb 12.7 oz (55.7 kg)     Lab Results  Component Value Date   CREATININE 1.43 (H) 12/01/2019   CREATININE 1.06 (H) 11/26/2019   CREATININE 1.01 (H) 11/25/2019    Physical Exam Vitals and nursing note reviewed.  Constitutional:  Appearance: She is well-developed.  HENT:     Head: Normocephalic and atraumatic.  Neck:     Vascular: No JVD.  Cardiovascular:     Rate and Rhythm: Regular rhythm. Bradycardia present.  Pulmonary:     Effort: Pulmonary effort is normal. No respiratory distress.     Breath sounds: No wheezing or rales.  Abdominal:     Palpations: Abdomen is soft.     Tenderness: There is no abdominal tenderness.  Musculoskeletal:     Cervical back: Normal range of motion and neck supple.     Right lower leg: No tenderness. No edema.     Left lower leg: No tenderness. No edema.  Skin:    General: Skin is warm and dry.  Neurological:     General: No focal deficit present.     Mental Status: She is alert and oriented to person, place, and time.  Psychiatric:        Mood and Affect: Mood normal.        Behavior: Behavior normal.     Assessment & Plan:  1: Chronic heart failure with reduced ejection fraction- - NYHA class III - euvolemic today - weighing but not daily; reminded to call for an overnight weight gain of >2 pounds or a weekly weight gain of >5 pounds - weight down 9 pounds from last visit here 6 weeks ago; having frequent diarrhea - unable to swallow anything including saliva without getting abdominal pain; gets all feeding through G-tube (encouraged her to call her GI provider) - saw cardiology Minette Brine) 01/04/20 & returns 03/31/20 -  bradycardic so unable to titrate carvedilol - BNP 11/23/19 was 1969.0 - has home health nurse from [place] coming - does wear compression socks and elevates her legs  2: HTN- - BP on the low side; consider changing lisinopril to entresto in the future if BP allows - saw PCP (Hande) 11/27/19 & returns 03/31/20 - BMP 12/04/19 reviewed and showed sodium 141, potassium 4.5, creatinine 1.2 and GFR 42  3: DM- - A1c 11/23/19 was 6.9% - fasting glucose in clinic today was 107  4: Anemia- - had iron infusion 12/18/19 - saw Dr. Mike Gip on 12/03/19   Patient did not bring her medications nor a list. Each medication was verbally reviewed with the patient and she was encouraged to bring the bottles to every visit to confirm accuracy of list.  Return in 6 months or sooner for any questions/problems before then.

## 2020-01-21 ENCOUNTER — Encounter: Payer: Self-pay | Admitting: Family

## 2020-01-21 ENCOUNTER — Ambulatory Visit: Payer: Medicare Other | Attending: Family | Admitting: Family

## 2020-01-21 ENCOUNTER — Other Ambulatory Visit: Payer: Self-pay

## 2020-01-21 VITALS — BP 125/40 | HR 50 | Resp 16 | Ht 63.0 in | Wt 113.0 lb

## 2020-01-21 DIAGNOSIS — K219 Gastro-esophageal reflux disease without esophagitis: Secondary | ICD-10-CM | POA: Insufficient documentation

## 2020-01-21 DIAGNOSIS — J90 Pleural effusion, not elsewhere classified: Secondary | ICD-10-CM | POA: Insufficient documentation

## 2020-01-21 DIAGNOSIS — Z931 Gastrostomy status: Secondary | ICD-10-CM | POA: Diagnosis not present

## 2020-01-21 DIAGNOSIS — R634 Abnormal weight loss: Secondary | ICD-10-CM | POA: Insufficient documentation

## 2020-01-21 DIAGNOSIS — D509 Iron deficiency anemia, unspecified: Secondary | ICD-10-CM

## 2020-01-21 DIAGNOSIS — R5383 Other fatigue: Secondary | ICD-10-CM | POA: Diagnosis present

## 2020-01-21 DIAGNOSIS — R42 Dizziness and giddiness: Secondary | ICD-10-CM | POA: Diagnosis not present

## 2020-01-21 DIAGNOSIS — G2581 Restless legs syndrome: Secondary | ICD-10-CM | POA: Insufficient documentation

## 2020-01-21 DIAGNOSIS — R197 Diarrhea, unspecified: Secondary | ICD-10-CM | POA: Diagnosis not present

## 2020-01-21 DIAGNOSIS — Z7901 Long term (current) use of anticoagulants: Secondary | ICD-10-CM | POA: Insufficient documentation

## 2020-01-21 DIAGNOSIS — Z7984 Long term (current) use of oral hypoglycemic drugs: Secondary | ICD-10-CM | POA: Insufficient documentation

## 2020-01-21 DIAGNOSIS — E1122 Type 2 diabetes mellitus with diabetic chronic kidney disease: Secondary | ICD-10-CM | POA: Diagnosis not present

## 2020-01-21 DIAGNOSIS — R109 Unspecified abdominal pain: Secondary | ICD-10-CM | POA: Diagnosis not present

## 2020-01-21 DIAGNOSIS — N189 Chronic kidney disease, unspecified: Secondary | ICD-10-CM | POA: Insufficient documentation

## 2020-01-21 DIAGNOSIS — E785 Hyperlipidemia, unspecified: Secondary | ICD-10-CM | POA: Insufficient documentation

## 2020-01-21 DIAGNOSIS — I272 Pulmonary hypertension, unspecified: Secondary | ICD-10-CM | POA: Insufficient documentation

## 2020-01-21 DIAGNOSIS — Z79899 Other long term (current) drug therapy: Secondary | ICD-10-CM | POA: Insufficient documentation

## 2020-01-21 DIAGNOSIS — Z8249 Family history of ischemic heart disease and other diseases of the circulatory system: Secondary | ICD-10-CM | POA: Insufficient documentation

## 2020-01-21 DIAGNOSIS — N1831 Chronic kidney disease, stage 3a: Secondary | ICD-10-CM

## 2020-01-21 DIAGNOSIS — I10 Essential (primary) hypertension: Secondary | ICD-10-CM

## 2020-01-21 DIAGNOSIS — R002 Palpitations: Secondary | ICD-10-CM | POA: Insufficient documentation

## 2020-01-21 DIAGNOSIS — D649 Anemia, unspecified: Secondary | ICD-10-CM | POA: Diagnosis not present

## 2020-01-21 DIAGNOSIS — I5022 Chronic systolic (congestive) heart failure: Secondary | ICD-10-CM | POA: Diagnosis not present

## 2020-01-21 DIAGNOSIS — M1990 Unspecified osteoarthritis, unspecified site: Secondary | ICD-10-CM | POA: Insufficient documentation

## 2020-01-21 DIAGNOSIS — I130 Hypertensive heart and chronic kidney disease with heart failure and stage 1 through stage 4 chronic kidney disease, or unspecified chronic kidney disease: Secondary | ICD-10-CM | POA: Diagnosis not present

## 2020-01-21 LAB — GLUCOSE, CAPILLARY: Glucose-Capillary: 107 mg/dL — ABNORMAL HIGH (ref 70–99)

## 2020-01-21 NOTE — Patient Instructions (Addendum)
Continue weighing daily and call for an overnight weight gain of > 2 pounds or a weekly weight gain of >5 pounds.   Consider changing lisinopril to entresto if blood pressure will allow.

## 2020-01-25 ENCOUNTER — Inpatient Hospital Stay: Payer: Medicare Other | Attending: Hematology and Oncology

## 2020-01-25 NOTE — Progress Notes (Signed)
Nutrition Follow-up:  Patient followed by Dr Mike Gip for iron deficiency anemia. Patient with history of CMV esophagitis and esophageal stenosis.  PEG replaced on 2/24.  Spoke with patient via phone for nutrition follow-up.  Patient reports that she is taking 3.4-4 Jevity 1.5 per day via feeding tube.  Also giving prosource 74ml (per Dr. Ginette Pitman) via feeding tube.  Patient reports that she is eating some soft foods (boiled egg mixed with ranch dressing, sweet potatoes and butter, toast (mashed with butter), strained broth based soups.  Usually takes her 1/2 day to eat 1 egg.  Drinks Glucerna shake but 1 will last 2 days.  Reports diarrhea but is usually controlled with imodium. Also reports gas but takes gas-x with relief.      Medications: lasix, glipizide, imodium, MVI, Vit C, protonix  Labs: reviewed  Anthropometrics:   Weight per patient 115 lb (checks weight every day at home).     Estimated Energy Needs  Kcals: 1560-1820 calories Protein: 78-91 g Fluid: 1.5 L , per MD with CHF  NUTRITION DIAGNOSIS: Inadequate oral intake continues but relying on feeding tube   INTERVENTION:  Patient not interested in trying another formula to see if improves diarrhea.   Continue jevity 1.5, up to 4 cartons per day with prosource 58ml daily.   Will provide 1670 calories, 85 g protein and 1234 ml free water (additional water to come from flush with medications) Patient has contact information    MONITORING, EVALUATION, GOAL: patient will utilize feeding tube to meet nutritional needs   NEXT VISIT: July 12th phone f/u  [name] B. Zenia Resides, [place], [place] Registered Dietitian [phone] (pager)

## 2020-01-29 ENCOUNTER — Ambulatory Visit (INDEPENDENT_AMBULATORY_CARE_PROVIDER_SITE_OTHER): Payer: Medicare Other | Admitting: Vascular Surgery

## 2020-01-29 ENCOUNTER — Encounter (INDEPENDENT_AMBULATORY_CARE_PROVIDER_SITE_OTHER): Payer: Medicare Other

## 2020-02-04 ENCOUNTER — Other Ambulatory Visit: Payer: Self-pay

## 2020-02-04 ENCOUNTER — Inpatient Hospital Stay: Payer: Medicare Other | Attending: Hematology and Oncology

## 2020-02-04 ENCOUNTER — Other Ambulatory Visit: Payer: Medicare Other

## 2020-02-04 DIAGNOSIS — D509 Iron deficiency anemia, unspecified: Secondary | ICD-10-CM | POA: Diagnosis not present

## 2020-02-04 LAB — BASIC METABOLIC PANEL
Anion gap: 9 (ref 5–15)
BUN: 45 mg/dL — ABNORMAL HIGH (ref 8–23)
CO2: 27 mmol/L (ref 22–32)
Calcium: 8.8 mg/dL — ABNORMAL LOW (ref 8.9–10.3)
Chloride: 103 mmol/L (ref 98–111)
Creatinine, Ser: 1.01 mg/dL — ABNORMAL HIGH (ref 0.44–1.00)
GFR calc Af Amer: 58 mL/min — ABNORMAL LOW (ref >60)
GFR calc non Af Amer: 50 mL/min — ABNORMAL LOW (ref >60)
Glucose, Bld: 155 mg/dL — ABNORMAL HIGH (ref 70–99)
Potassium: 4.7 mmol/L (ref 3.5–5.1)
Sodium: 139 mmol/L (ref 135–145)

## 2020-02-04 LAB — CBC WITH DIFFERENTIAL/PLATELET
Abs Immature Granulocytes: 0.02 10*3/uL (ref 0.00–0.07)
Basophils Absolute: 0.1 10*3/uL (ref 0.0–0.1)
Basophils Relative: 1 %
Eosinophils Absolute: 0.5 10*3/uL (ref 0.0–0.5)
Eosinophils Relative: 8 %
HCT: 31.7 % — ABNORMAL LOW (ref 36.0–46.0)
Hemoglobin: 10.2 g/dL — ABNORMAL LOW (ref 12.0–15.0)
Immature Granulocytes: 0 %
Lymphocytes Relative: 20 %
Lymphs Abs: 1.2 10*3/uL (ref 0.7–4.0)
MCH: 28.3 pg (ref 26.0–34.0)
MCHC: 32.2 g/dL (ref 30.0–36.0)
MCV: 87.8 fL (ref 80.0–100.0)
Monocytes Absolute: 0.4 10*3/uL (ref 0.1–1.0)
Monocytes Relative: 6 %
Neutro Abs: 4.1 10*3/uL (ref 1.7–7.7)
Neutrophils Relative %: 65 %
Platelets: 260 10*3/uL (ref 150–400)
RBC: 3.61 MIL/uL — ABNORMAL LOW (ref 3.87–5.11)
RDW: 17.6 % — ABNORMAL HIGH (ref 11.5–15.5)
WBC: 6.3 10*3/uL (ref 4.0–10.5)
nRBC: 0 % (ref 0.0–0.2)

## 2020-02-04 LAB — IRON AND TIBC
Iron: 57 ug/dL (ref 28–170)
Saturation Ratios: 19 % (ref 10.4–31.8)
TIBC: 304 ug/dL (ref 250–450)
UIBC: 247 ug/dL

## 2020-02-04 LAB — FERRITIN: Ferritin: 75 ng/mL (ref 11–307)

## 2020-02-04 NOTE — Progress Notes (Signed)
[facility]  [address], [address] [place], [place] [address] Phone: [phone]  Fax: [phone]   Clinic [name]:  02/05/2020  Referring physician: [name], MD  Chief Complaint: [name] is a 86 y.o. female with iron deficiency anemia who is seen for a 2 month assessment.  HPI: The patient was last seen in the hematology clinic on 12/03/2019. At that time, she was fatigued.  She denied any bleeding except for limited epistaxis.  She had orthopnea and lower extremity edema. Hematocrit was 29.6, hemoglobin 9.2, MCV 85.1, platelets 359,000, and WBC 8900. Iron saturation was 6% with a TIBC 371.   She Venofer weekly x 3 (12/03/2019 - 12/18/2019).   Patient spoke with Redgie Grayer, registered dietician on 01/25/2020.  She is taking 3.4-4 Jevity 1.5 per [name] via feeding tube with prosource 27ml per [name] via feeding tube. In addition, she was eating some soft foods.  Total calorie intake was estimated 1670.  Diarrhea was controlled with Imodium.  She was not interested in trying another formula to see if diarrhea improves.  Labs on 02/04/2020: hematocrit 31.7, hemoglobin 10.2, MCV 87.8, Platelets 260,000, WBC 6,300. Ferritin was 75 with an iron saturation of 19% and a TIBC 304.   During the interim, she has been "fair", but she has been experiencing some diarrhea. She notes that she is on a feeding tube and she believes that it is contributing to her diarrhea. She does not want to change her formula.   She continues to struggle with esophagitis, which can make eating difficult at times. She makes sure to chew her food well and feels if it begins to hurt to swallow.   She has an appointment to see her eye doctor in 06/2020. She continues to have to prop up her head with a bunch of pillows at night.    Past Medical History:  Diagnosis Date  . Anemia    IRON INFUSIONS  . Aortic valve sclerosis   . Arrhythmia   . Arthritis    osteoarthritis  . Basal cell  carcinoma of skin   . Brain tumor ([place])   . Brain tumor ([place])   . Cervical spine disease   . CHF (congestive heart failure) ([place])   . Diabetes mellitus without complication ([place])   . Dysrhythmia    sinus arrhythmia  . Esophageal stricture    severe, led to feeding tube placement in Sept 2019  . Esophageal ulcer without bleeding   . Gastrostomy tube dependent ([place])    DOES NOT EAT OR DRINK   . GERD (gastroesophageal reflux disease)   . History of kidney stones   . Hyperlipemia   . Hypertension   . Kidney stones   . Leaky heart valve   . Meningioma ([place])   . Occlusive mesenteric ischemia ([place])   . Pulmonary hypertension ([place])   . RLS (restless legs syndrome)   . Vertigo     Past Surgical History:  Procedure Laterality Date  . ABDOMINAL HYSTERECTOMY    . APPENDECTOMY    . ARTHROGRAM KNEE Left   . BACK SURGERY     CERVIVAL NECK FUSION  . CATARACT EXTRACTION W/PHACO Left 11/11/2018   Procedure: CATARACT EXTRACTION PHACO AND INTRAOCULAR LENS PLACEMENT (IOC) LEFT, DIABETIC;  Surgeon: Birder Robson, MD;  Location: [facility] ORS;  Service: Ophthalmology;  Laterality: Left;  [place]  00:41 CDE 6.41 Fluid pack lot # [id number]  . COLONOSCOPY WITH PROPOFOL N/A 06/20/2017   Procedure: COLONOSCOPY WITH PROPOFOL;  Surgeon: Lollie Sails, MD;  Location: [facility] ENDOSCOPY;  Service: Endoscopy;  Laterality: N/A;  . ESOPHAGOGASTRODUODENOSCOPY (EGD) WITH PROPOFOL N/A 03/14/2015   Procedure: ESOPHAGOGASTRODUODENOSCOPY (EGD) WITH PROPOFOL;  Surgeon: Josefine Class, MD;  Location: [facility] ENDOSCOPY;  Service: Endoscopy;  Laterality: N/A;  . ESOPHAGOGASTRODUODENOSCOPY (EGD) WITH PROPOFOL N/A 03/28/2017   Procedure: ESOPHAGOGASTRODUODENOSCOPY (EGD) WITH PROPOFOL;  Surgeon: Lollie Sails, MD;  Location: [facility] ENDOSCOPY;  Service: Endoscopy;  Laterality: N/A;  . ESOPHAGOGASTRODUODENOSCOPY (EGD) WITH PROPOFOL N/A 06/20/2017   Procedure: ESOPHAGOGASTRODUODENOSCOPY (EGD) WITH PROPOFOL;  Surgeon: Lollie Sails, MD;  Location[facility] ENDOSCOPY;  Service: Endoscopy;  Laterality: N/A;  . ESOPHAGOGASTRODUODENOSCOPY (EGD) WITH PROPOFOL N/A 10/21/2017   Procedure: ESOPHAGOGASTRODUODENOSCOPY (EGD) WITH PROPOFOL;  Surgeon: Lollie Sails, MD;  Location: [facility] ENDOSCOPY;  Service: Endoscopy;  Laterality: N/A;  . ESOPHAGOGASTRODUODENOSCOPY (EGD) WITH PROPOFOL N/A 04/15/2018   Procedure: ESOPHAGOGASTRODUODENOSCOPY (EGD) WITH PROPOFOL;  Surgeon: Lollie Sails, MD;  Location: [facility] ENDOSCOPY;  Service: Endoscopy;  Laterality: N/A;  . ESOPHAGUS SURGERY     CLOSURE  . EYE SURGERY    . HYSTERECTOMY ABDOMINAL WITH SALPINGECTOMY    . IR GASTROSTOMY TUBE MOD SED  06/11/2018  . IR GASTROSTOMY TUBE REMOVAL  03/25/2019  . PERIPHERAL VASCULAR CATHETERIZATION N/A 01/23/2016   Procedure: Visceral Venography;  Surgeon: Algernon Huxley, MD;  Location: [place] CV LAB;  Service: Cardiovascular;  Laterality: N/A;  . PERIPHERAL VASCULAR CATHETERIZATION  01/23/2016   Procedure: Peripheral Vascular Intervention;  Surgeon: Algernon Huxley, MD;  Location: [place] CV LAB;  Service: Cardiovascular;;  . UPPER ESOPHAGEAL ENDOSCOPIC ULTRASOUND (EUS) N/A 12/05/2017   Procedure: UPPER ESOPHAGEAL ENDOSCOPIC ULTRASOUND (EUS);  Surgeon: Jola Schmidt, MD;  Location: [facility] ENDOSCOPY;  Service: Endoscopy;  Laterality: N/A;  . VISCERAL ANGIOGRAPHY N/A 03/18/2017   Procedure: Visceral Angiography;  Surgeon: Algernon Huxley, MD;  Location: [place] CV LAB;  Service: Cardiovascular;  Laterality: N/A;  . VISCERAL ARTERY INTERVENTION N/A 03/18/2017   Procedure: Visceral Artery Intervention;  Surgeon: Algernon Huxley, MD;  Location: [place] CV LAB;  Service: Cardiovascular;  Laterality: N/A;    Family History  Problem Relation Age of Onset  . Stroke Mother   . Hypertension Mother   . Heart disease Mother   . Cancer Father   . Heart disease Father   . Heart attack Sister   . Diabetes Sister   . Heart attack Brother     Social  History:  reports that she has never smoked. She has never used smokeless tobacco. She reports that she does not drink alcohol or use drugs.  She is retired. She worked at [employer]. She lives in [place].Patient's contact number is [phone]. Daughter's number is [phone] [name]). The patient is accompanied by her daughter, [name], via [place] today.   Allergies:  Allergies  Allergen Reactions  . Gabapentin Swelling  . Lipitor [Atorvastatin] Other (See Comments)    Muscle aches  . Mevacor [Lovastatin] Other (See Comments)    Muscle aches  . Milk-Related Compounds Other (See Comments)    Large quantities cause headaches   . Septra [Sulfamethoxazole-Trimethoprim] Other (See Comments)    Unknown  . Statins Other (See Comments)    Muscle pain  . Sulfur Diarrhea and Nausea And Vomiting  . Zocor [Simvastatin] Other (See Comments)    Muscle aches    Current Medications: Current Outpatient Medications  Medication Sig Dispense Refill  . acetaminophen (TYLENOL) 160 MG/5ML solution Place 20.3 mLs (650  mg total) into feeding tube every 6 (six) hours as needed for mild pain. 120 mL 0  . allopurinol (ZYLOPRIM) 100 MG tablet Place 100 mg into feeding tube daily.    . Amino Acids-Protein Hydrolys (FEEDING SUPPLEMENT, PRO-STAT SUGAR FREE 64,) LIQD Place 30 mLs into feeding tube daily. 450 mL 1  . carvedilol (COREG) 6.25 MG tablet Take 6.25 mg by mouth 2 (two) times daily with a meal.    . clopidogrel (PLAVIX) 75 MG tablet Place 1 tablet (75 mg total) into feeding tube daily. 30 tablet 6  . diltiazem (CARDIZEM) 120 MG tablet Take 120 mg by mouth daily.     . furosemide (LASIX) 10 MG/ML solution Place 60 mg into feeding tube daily.     Marland Kitchen glipiZIDE (GLUCOTROL) 5 MG tablet Place 1 tablet (5 mg total) into feeding tube daily before breakfast.    . hydrALAZINE (APRESOLINE) 50 MG tablet Place 1 tablet (50 mg total) into feeding tube 2 (two) times daily.    . Hypromellose  (GENTEAL MILD) 0.2 % SOLN Place 1 drop into both eyes 3 (three) times daily as needed (dry eyes).     Marland Kitchen lisinopril (ZESTRIL) 40 MG tablet Take 20 mg by mouth daily.     Marland Kitchen loperamide (IMODIUM) 1 MG/5ML solution Take 3 mg by mouth as needed for diarrhea or loose stools.    . meclizine (ANTIVERT) 25 MG tablet Place 25 mg into feeding tube as needed for dizziness.     . Multiple Vitamin (MULTIVITAMIN) LIQD Place 15 mLs into feeding tube daily. 450 mL 1  . Nutritional Supplements (FEEDING SUPPLEMENT, JEVITY 1.5 CAL/FIBER,) LIQD Place 237 mLs into feeding tube 4 (four) times daily. 237 mL 90  . rOPINIRole (REQUIP) 2 MG tablet Place 1 tablet (2 mg total) into feeding tube 2 (two) times daily. 60 tablet 1  . spironolactone (ALDACTONE) 25 MG tablet Take 25 mg by mouth daily.    . Water For Irrigation, Sterile (FREE WATER) SOLN Place 30 mLs into feeding tube every 4 (four) hours.    . pantoprazole sodium (PROTONIX) 40 mg/20 mL PACK Place 20 mLs (40 mg total) into feeding tube 2 (two) times daily. (Patient not taking: Reported on 02/05/2020) 30 each 1  . vitamin C (VITAMIN C) 250 MG tablet Place 1 tablet (250 mg total) into feeding tube 2 (two) times daily. (Patient not taking: Reported on 02/05/2020) 60 tablet 1   No current facility-administered medications for this visit.    Review of Systems  Constitutional: Negative.  Negative for chills, diaphoresis, fever, malaise/fatigue and weight loss (up 3 lbs).       Feels "fair".  HENT: Positive for nosebleeds (after blowing nose; scab in right nostril). Negative for congestion, ear pain, hearing loss, sinus pain and sore throat.   Eyes: Positive for blurred vision (vision issues, chronic). Negative for double vision, photophobia and pain.  Respiratory: Negative.  Negative for cough, sputum production, shortness of breath and wheezing.   Cardiovascular: Positive for orthopnea and leg swelling (BLE). Negative for palpitations and PND.  Gastrointestinal: Positive  for diarrhea (controlled with one imodium QOD). Negative for abdominal pain, blood in stool, constipation, heartburn, melena, nausea and vomiting.       Eating a little better.  Genitourinary: Negative.  Negative for dysuria, frequency, hematuria and urgency.  Musculoskeletal: Positive for joint pain (arthritis, chronic pain). Negative for back pain, myalgias and neck pain.  Skin: Negative.  Negative for rash.  Neurological: Positive for dizziness and weakness (  generalized). Negative for tingling, sensory change, focal weakness, seizures and headaches.       Vertigo.  Endo/Heme/Allergies: Bruises/bleeds easily.       Thyroid disease on Synthroid.   Psychiatric/Behavioral: Negative.  Negative for depression and memory loss. The patient is not nervous/anxious and does not have insomnia.   All other systems reviewed and are negative.  Performance status (ECOG): 2 - Symptomatic, <50% confined to bed   Vitals Blood pressure (!) 163/63, pulse (!) 57, temperature (!) 96.3 F (35.7 C), temperature source Tympanic, resp. rate 18, height 5\' 3"  (1.6 m), weight 116 lb 4.8 oz (52.8 kg), SpO2 100 %.   Physical Exam  Constitutional: She is oriented to person, place, and time. She appears well-developed and well-nourished. No distress. Face mask in place.  She has a cane by her side.  HENT:  Head: Normocephalic and atraumatic.  Mouth/Throat: Oropharynx is clear and moist.  Short gray hair pulled back.  Eyes: Pupils are equal, round, and reactive to light. Conjunctivae and EOM are normal. No scleral icterus.  Glasses.  Hazel eyes. Left eyelid slightly red.  Neck: No JVD present.  Cardiovascular: Normal rate, regular rhythm and normal heart sounds. Exam reveals no gallop and no friction rub.  Aortic stenosis murmur.  Pulmonary/Chest: Effort normal. No respiratory distress. She has no wheezes. She has rales (dry).  Shortness of breath at 40 degrees.  Abdominal: Soft. Bowel sounds are normal. She  exhibits no distension and no mass. There is no abdominal tenderness. There is no rebound and no guarding.  G tube.  Musculoskeletal:        General: Tenderness (BLE) and edema (2+, BLE) present. Normal range of motion.     Cervical back: Normal range of motion and neck supple.     Right ankle: No tenderness.  Lymphadenopathy:       Head (right side): No preauricular, no posterior auricular and no occipital adenopathy present.       Head (left side): No preauricular, no posterior auricular and no occipital adenopathy present.    She has no cervical adenopathy.    She has no axillary adenopathy.       Right: No inguinal and no supraclavicular adenopathy present.       Left: No inguinal and no supraclavicular adenopathy present.  Neurological: She is alert and oriented to person, place, and time.  Skin: Skin is warm and dry. Bruising and ecchymosis (upper extremity) noted. No rash noted. She is not diaphoretic. No erythema. No pallor.  Psychiatric: She has a normal mood and affect. Her behavior is normal. Judgment and thought content normal.  Nursing note and vitals reviewed.   Appointment on 02/04/2020  Component Date Value Ref Range Status  . Iron 02/04/2020 57  28 - 170 ug/dL Final  . TIBC 02/04/2020 304  250 - 450 ug/dL Final  . Saturation Ratios 02/04/2020 19  10.4 - 31.8 % Final  . UIBC 02/04/2020 247  ug/dL Final   Performed at [facility], [address], [place], [place] [address]  . Sodium 02/04/2020 139  135 - 145 mmol/L Final  . Potassium 02/04/2020 4.7  3.5 - 5.1 mmol/L Final  . Chloride 02/04/2020 103  98 - 111 mmol/L Final  . CO2 02/04/2020 27  22 - 32 mmol/L Final  . Glucose, Bld 02/04/2020 155* 70 - 99 mg/dL Final   Glucose reference range applies only to samples taken after fasting for at least 8 hours.  . BUN 02/04/2020 45*  8 - 23 mg/dL Final  . Creatinine, Ser 02/04/2020 1.01* 0.44 - 1.00 mg/dL Final  . Calcium 02/04/2020 8.8* 8.9 - 10.3 mg/dL Final  .  GFR calc non Af Amer 02/04/2020 50* >60 mL/min Final  . GFR calc Af Amer 02/04/2020 58* >60 mL/min Final  . Anion gap 02/04/2020 9  5 - 15 Final   Performed at [facility], [address] [place], [place] [address]  . Ferritin 02/04/2020 75  11 - 307 ng/mL Final   Performed at [facility], [address] [place], [place] [address]  . WBC 02/04/2020 6.3  4.0 - 10.5 K/uL Final  . RBC 02/04/2020 3.61* 3.87 - 5.11 MIL/uL Final  . Hemoglobin 02/04/2020 10.2* 12.0 - 15.0 g/dL Final  . HCT 02/04/2020 31.7* 36.0 - 46.0 % Final  . MCV 02/04/2020 87.8  80.0 - 100.0 fL Final  . MCH 02/04/2020 28.3  26.0 - 34.0 pg Final  . MCHC 02/04/2020 32.2  30.0 - 36.0 g/dL Final  . RDW 02/04/2020 17.6* 11.5 - 15.5 % Final  . Platelets 02/04/2020 260  150 - 400 K/uL Final  . nRBC 02/04/2020 0.0  0.0 - 0.2 % Final  . Neutrophils Relative % 02/04/2020 65  % Final  . Neutro Abs 02/04/2020 4.1  1.7 - 7.7 K/uL Final  . Lymphocytes Relative 02/04/2020 20  % Final  . Lymphs Abs 02/04/2020 1.2  0.7 - 4.0 K/uL Final  . Monocytes Relative 02/04/2020 6  % Final  . Monocytes Absolute 02/04/2020 0.4  0.1 - 1.0 K/uL Final  . Eosinophils Relative 02/04/2020 8  % Final  . Eosinophils Absolute 02/04/2020 0.5  0.0 - 0.5 K/uL Final  . Basophils Relative 02/04/2020 1  % Final  . Basophils Absolute 02/04/2020 0.1  0.0 - 0.1 K/uL Final  . Immature Granulocytes 02/04/2020 0  % Final  . Abs Immature Granulocytes 02/04/2020 0.02  0.00 - 0.07 K/uL Final   Performed at [facility], [address] [place], [place] [address]    Assessment:  SHERADYN KWIATEK [name] is a 86 y.o. female withiron deficiency anemia. She has been on oral iron x 1 year with continued decline in her counts. She has dysphagia and odynophagia. She is on PPI and carafate. Dietis modest. She denies pica.  Abdomen and pelvic CTon 02/19/2017 revealed no acute abnormality. She has mild sigmoid diverticulosis.  CBCon 02/26/2017  revealed a hematocrit of 25.3, hemoglobin 8.0, and MCV 83.2. Hemoccult studiesx 1 were positive in 11/19/2016 and negative x 2 in 01/2017. Ferritin was 5 with an iron saturation of 4%, and a TIBC of 487 on 01/30/2017. Normal studies included: creatinine (1.0), calcium, albumen, B12 (647), and TSH. Folic acid was 123XX123 on 08/09/2015. Urinalysis revealed no blood on 04/25/2018and 10/28/2018. SPEP was negative on 02/28/2017.  She received Venoferweekly x 3 (02/28/2017 - 03/14/2017), x 2 (04/19/2017 - 04/26/2017), x 2 (06/14/2017-06/21/2017),11/21/2017, x 3 (01/09/2018 - 01/23/2018), 02/20/2018, x 2 (08/22/2018 - 09/01/2018), 11/26/2018, x3 (02/18/2019 - 03/04/2019), x 3 (05/13/2019 - 05/29/2019), and x3 (12/03/2019 - 12/18/2019).  Ferritin has been followed: 8 on 02/28/2017, 164 on 03/19/2017, 27 on 04/18/2017, 82 on 05/17/2017, 38 on 06/10/2017, 100 on 07/18/2017, 40 on 09/18/2017, 9 on 11/19/2017, 13 on 01/02/2018, 57 on 02/19/2018, 72 on 04/04/2018, 41 on 05/21/2018, 36 on 08/18/2018,80 on 09/22/2018, 45 on 11/25/2018,96 on 01/07/2019,48 on 02/17/2019, 104 on 04/02/2019, 73 on 05/12/2019, 123 on 06/29/2019, 55 on 08/20/2019, 26 on 11/26/2019, and 75 on 02/04/2020.  EGDin  2016 revealed gastric ulcers and esophageal stricture which was dilated. Barium swallowon 11/15/2015 was normal. She has a history of mesenteric ischemia. 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 She was on Coumadin, and now on Plavix. Colonoscopyin 2008 revealed diverticulosis.   EGDon 03/28/2017 revealed a moderate-sized area of extrinsic compression was found in the mid esophagus from about 25-28 cm, no apparent defect or abnormality in the mucosa or esophageal wall. There was a 2 cm cratered esophageal ulcer. There was segmental moderate mucosal changes characterized by granularity at the gastroesophageal junction. There was localized mild inflammation characterized by congestion (edema), depression  and erythema was found on the greater curvature of the gastric body. Biopsies revealed no H pylori, dysplasia or malignancy. She is on pantoprazole and Carafate. She has esophageal spasmsimproved with Bentyl.  EGDon 06/20/2017 revealed a non-bleeding esophageal ulcer, gastritis, and a normal duodenum. Pathology revealed mild reactive gastropathy. Colonoscopyon 06/20/2017 revealed non-bleeding external hemorrhoids, diverticulosis in the sigmoid colon, and one 1 mm polyp at the recto-sigmoid colon. Pathology revealed a tubular adenoma Negative for high grade dysplasia or malignancy.  Upper EUSon 12/05/2017 revealed esophageal stenosis. Biopsy revealed CMV viral cytopathic effect.There was no evidence of malignancy. The EUS scope could not be advanced into the stenosis as it was too tight. There was loss of normal wall layers and diffusely hypoechoic measuring from 3 mm to 6 mm in thickness.   She was treated with valacyclovir x 28 days. CMV DNA quantitative by PCR was positive on 01/02/2018 and <200 IU/ml on 01/30/2018.  Upper GI endoscopyon 04/15/2018 revealed extrinsic compression in the mid esophagus. Esophageal biopsy revealed detached stratified squamous epithelium with focal reactive parakeratosis. There was no dysplasia or malignancy. There was a benign appearing esophageal stenosis. She underwentEGD with esophageal dilatationon 05/01/2018 and 05/19/2018 [facility].  Chest CT on 05/28/2018 revealed distal esophageal stricture at the GE junction with associated mild submucosal edema, possibly reflecting a post infectious/inflammatory stricture, underlying neoplasm not entirely excluded. There was no extrinsic compression.  She underwent PEG tube placementon 09/11/2019and exchange on 03/25/2019. She takes little by mouth.  Symptomatically, she feels "fair".  She has chronic issues with CHF.    Plan: 1.   Review labs from 02/04/2020. 2. Iron deficiency anemia  Hematocrit 31.7. Hemoglobin 10.2. MCV87.8. Ferritin75 with an iron saturation of 19% and a TIBC 304.  No need for IV iron.  Discuss Venofer if ferritin < 30.             B12 was 706 and folate 25 on 05/12/2019.  Suspect some component of anemia of chronic disease.             Continue to monitor. 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 Patient'sprimary nutrition is through her G-tube.  Continue to follow. 4.   Renal insufficiency  Creatinine 1.01 (previously 1.43 on 12/01/2019).  Creatinine was 0.75-0.85 1 year ago.  No intervention needed. 5.   Orthopnea  Patient has chronic CHF.  Echo on 11/24/2019 showed an EF of 20-25%.  6.   No Venofer today. 7.   RTC in 2 months for labs (CBC, ferritin). 8.   RTC in 4 months for MD assessment, labs (CBC with diff, ferritin, iron studies- [name] before), and +/- Venofer.  I discussed the assessment and treatment plan with the patient.  The patient was provided an opportunity to ask questions and all were answered.  The patient agreed with the plan and  demonstrated an understanding of the instructions.  The patient was advised to call back if the symptoms worsen or if the condition fails to improve as anticipated.  I provided 9 minutes (9:13 AM - 9:22 AM) of face-to-face time during this encounter and > 50% was spent counseling as documented under my assessment and plan.  An additional 5 minutes were spent reviewing her chart (Epic and Care Everywhere) including notes, labs, and imaging studies.    Lequita Asal, MD, PhD    02/05/2020, 9:22 AM  I, Jacqualyn Posey, am acting as a Education administrator for [employer]. Mike Gip, MD.   I, Melissa C. Mike Gip, MD, have reviewed the above documentation for accuracy and completeness, and I agree with the above.

## 2020-02-05 ENCOUNTER — Inpatient Hospital Stay: Payer: Medicare Other

## 2020-02-05 ENCOUNTER — Inpatient Hospital Stay: Payer: Medicare Other | Attending: Hematology and Oncology | Admitting: Hematology and Oncology

## 2020-02-05 ENCOUNTER — Encounter: Payer: Self-pay | Admitting: Hematology and Oncology

## 2020-02-05 VITALS — BP 163/63 | HR 57 | Temp 96.3°F | Resp 18 | Ht 63.0 in | Wt 116.3 lb

## 2020-02-05 DIAGNOSIS — Z931 Gastrostomy status: Secondary | ICD-10-CM | POA: Diagnosis not present

## 2020-02-05 DIAGNOSIS — K5730 Diverticulosis of large intestine without perforation or abscess without bleeding: Secondary | ICD-10-CM | POA: Diagnosis not present

## 2020-02-05 DIAGNOSIS — I110 Hypertensive heart disease with heart failure: Secondary | ICD-10-CM | POA: Diagnosis not present

## 2020-02-05 DIAGNOSIS — B259 Cytomegaloviral disease, unspecified: Secondary | ICD-10-CM

## 2020-02-05 DIAGNOSIS — K208 Other esophagitis without bleeding: Secondary | ICD-10-CM | POA: Diagnosis not present

## 2020-02-05 DIAGNOSIS — N289 Disorder of kidney and ureter, unspecified: Secondary | ICD-10-CM | POA: Insufficient documentation

## 2020-02-05 DIAGNOSIS — K209 Esophagitis, unspecified without bleeding: Secondary | ICD-10-CM | POA: Insufficient documentation

## 2020-02-05 DIAGNOSIS — D509 Iron deficiency anemia, unspecified: Secondary | ICD-10-CM | POA: Diagnosis not present

## 2020-02-05 DIAGNOSIS — Z7902 Long term (current) use of antithrombotics/antiplatelets: Secondary | ICD-10-CM | POA: Diagnosis not present

## 2020-02-05 DIAGNOSIS — Z79899 Other long term (current) drug therapy: Secondary | ICD-10-CM | POA: Diagnosis not present

## 2020-02-05 NOTE — Progress Notes (Signed)
The patient c/o diarrhea due to tube feedings

## 2020-04-01 ENCOUNTER — Inpatient Hospital Stay: Payer: Medicare Other | Attending: Hematology and Oncology

## 2020-04-01 ENCOUNTER — Other Ambulatory Visit: Payer: Self-pay

## 2020-04-01 DIAGNOSIS — D509 Iron deficiency anemia, unspecified: Secondary | ICD-10-CM | POA: Diagnosis not present

## 2020-04-01 LAB — CBC WITH DIFFERENTIAL/PLATELET
Abs Immature Granulocytes: 0.01 10*3/uL (ref 0.00–0.07)
Basophils Absolute: 0.1 10*3/uL (ref 0.0–0.1)
Basophils Relative: 1 %
Eosinophils Absolute: 0.5 10*3/uL (ref 0.0–0.5)
Eosinophils Relative: 9 %
HCT: 29 % — ABNORMAL LOW (ref 36.0–46.0)
Hemoglobin: 9.5 g/dL — ABNORMAL LOW (ref 12.0–15.0)
Immature Granulocytes: 0 %
Lymphocytes Relative: 19 %
Lymphs Abs: 1.1 10*3/uL (ref 0.7–4.0)
MCH: 29.4 pg (ref 26.0–34.0)
MCHC: 32.8 g/dL (ref 30.0–36.0)
MCV: 89.8 fL (ref 80.0–100.0)
Monocytes Absolute: 0.4 10*3/uL (ref 0.1–1.0)
Monocytes Relative: 8 %
Neutro Abs: 3.5 10*3/uL (ref 1.7–7.7)
Neutrophils Relative %: 63 %
Platelets: 305 10*3/uL (ref 150–400)
RBC: 3.23 MIL/uL — ABNORMAL LOW (ref 3.87–5.11)
RDW: 14.9 % (ref 11.5–15.5)
WBC: 5.6 10*3/uL (ref 4.0–10.5)
nRBC: 0 % (ref 0.0–0.2)

## 2020-04-01 LAB — FERRITIN: Ferritin: 51 ng/mL (ref 11–307)

## 2020-04-06 ENCOUNTER — Telehealth: Payer: Self-pay | Admitting: Hematology and Oncology

## 2020-04-06 ENCOUNTER — Telehealth: Payer: Self-pay | Admitting: *Deleted

## 2020-04-06 ENCOUNTER — Other Ambulatory Visit: Payer: Self-pay

## 2020-04-06 DIAGNOSIS — D509 Iron deficiency anemia, unspecified: Secondary | ICD-10-CM

## 2020-04-06 NOTE — Telephone Encounter (Signed)
Daughter called reporting that they have not heard anything back regarding patient lab results and that patient is VERY weak and is asking if she needs an infusion Please return her call

## 2020-04-06 NOTE — Telephone Encounter (Signed)
Re:  Labs  Called patient to discuss labs.  Unclear if labs indicate iron deficiency (ferritin 51) as ferritin may be falsely elevated, Discuss plan to check additional labs and receive an iron infusion.  Ferritin goal 100.  If anemia improves, will plan additional IV iron.   Lequita Asal, MD

## 2020-04-07 ENCOUNTER — Other Ambulatory Visit: Payer: Self-pay

## 2020-04-07 ENCOUNTER — Inpatient Hospital Stay: Payer: Medicare Other

## 2020-04-07 VITALS — BP 147/58 | HR 61 | Temp 95.5°F | Resp 16

## 2020-04-07 DIAGNOSIS — D509 Iron deficiency anemia, unspecified: Secondary | ICD-10-CM | POA: Diagnosis not present

## 2020-04-07 LAB — IRON AND TIBC
Iron: 36 ug/dL (ref 28–170)
Saturation Ratios: 11 % (ref 10.4–31.8)
TIBC: 325 ug/dL (ref 250–450)
UIBC: 289 ug/dL

## 2020-04-07 LAB — RETICULOCYTES
Immature Retic Fract: 13.9 % (ref 2.3–15.9)
RBC.: 3.29 MIL/uL — ABNORMAL LOW (ref 3.87–5.11)
Retic Count, Absolute: 57.6 10*3/uL (ref 19.0–186.0)
Retic Ct Pct: 1.8 % (ref 0.4–3.1)

## 2020-04-07 LAB — HEMOGLOBIN: Hemoglobin: 9.8 g/dL — ABNORMAL LOW (ref 12.0–15.0)

## 2020-04-07 LAB — C-REACTIVE PROTEIN: CRP: <0.5 mg/dL (ref <1.0)

## 2020-04-07 LAB — SEDIMENTATION RATE: Sed Rate: 33 mm/hr — ABNORMAL HIGH (ref 0–30)

## 2020-04-07 MED ORDER — IRON SUCROSE 20 MG/ML IV SOLN
200.0000 mg | Freq: Once | INTRAVENOUS | Status: AC
  Administered 2020-04-07: 200 mg via INTRAVENOUS
  Filled 2020-04-07: qty 10

## 2020-04-07 MED ORDER — SODIUM CHLORIDE 0.9 % IV SOLN
Freq: Once | INTRAVENOUS | Status: AC
  Filled 2020-04-07: qty 250

## 2020-04-07 NOTE — Patient Instructions (Signed)

## 2020-04-11 ENCOUNTER — Inpatient Hospital Stay: Payer: Medicare Other | Attending: Hematology and Oncology

## 2020-04-11 NOTE — Progress Notes (Signed)
Nutrition Follow-up:  Patient followed by Dr. Mike Gip for iron deficiency anemia.  Patient with history of CMV esophagitis and esophageal stenosis.  PEG replaced on 2/24.    Spoke with patient via phone for nutrition follow-up.  Patient continues to take 3-5-4 cartons of jevity 1.5 daily via feeding tube and 72m of prosource daily.  Patient is eating very small amounts orally, nutritional needs being met by tube feeding.  Patient reports diarrhea but takes imodium AD 1 dose every other day to manage diarrhea.    Patient reports that she went to see Dr. HGinette Pitmanand Dr FUbaldo Glassinglast week and got good reports.  Lasix was decreased.      Medications: reviewed  Labs: reviewed  Anthropometrics:   Weight per patient at MD office (Dr HGinette Pitmanon 7/1 116 lb per Care Everywhere)   Estimated Energy Needs  Kcals: 1560-1820 calories Protein: 78-91 g Fluid: 1.5 L, per MD with CHF  NUTRITION DIAGNOSIS: Inadequate oral intake continues but relying on feeding tube for nutrition   INTERVENTION:  Patient to continue with jevity 1.5, 4 cartons daily and 766mprosource daily.   Provides 1670 calories, 85 g protein and 1234 ml free water (additional water to come from flush with medications) Patient does not want to try another formula to see if diarrhea will improve.   Patient has contact information and will contact RD if needed in the future.       NEXT VISIT: no follow-up as patient tolerating tube feeding and  maintaining weight.    Hasnain Manheim B. AlZenia ResidesRDBlue RiverLDHydesvilleegistered Dietitian [phone])

## 2020-06-01 ENCOUNTER — Other Ambulatory Visit: Payer: Self-pay

## 2020-06-01 ENCOUNTER — Inpatient Hospital Stay: Payer: Medicare Other | Attending: Hematology and Oncology

## 2020-06-01 DIAGNOSIS — I509 Heart failure, unspecified: Secondary | ICD-10-CM | POA: Insufficient documentation

## 2020-06-01 DIAGNOSIS — K208 Other esophagitis without bleeding: Secondary | ICD-10-CM | POA: Diagnosis not present

## 2020-06-01 DIAGNOSIS — D509 Iron deficiency anemia, unspecified: Secondary | ICD-10-CM | POA: Diagnosis not present

## 2020-06-01 LAB — CBC WITH DIFFERENTIAL/PLATELET
Abs Immature Granulocytes: 0.03 10*3/uL (ref 0.00–0.07)
Basophils Absolute: 0.1 10*3/uL (ref 0.0–0.1)
Basophils Relative: 1 %
Eosinophils Absolute: 0.6 10*3/uL — ABNORMAL HIGH (ref 0.0–0.5)
Eosinophils Relative: 8 %
HCT: 33.2 % — ABNORMAL LOW (ref 36.0–46.0)
Hemoglobin: 10.8 g/dL — ABNORMAL LOW (ref 12.0–15.0)
Immature Granulocytes: 0 %
Lymphocytes Relative: 19 %
Lymphs Abs: 1.4 10*3/uL (ref 0.7–4.0)
MCH: 29.9 pg (ref 26.0–34.0)
MCHC: 32.5 g/dL (ref 30.0–36.0)
MCV: 92 fL (ref 80.0–100.0)
Monocytes Absolute: 0.4 10*3/uL (ref 0.1–1.0)
Monocytes Relative: 5 %
Neutro Abs: 4.9 10*3/uL (ref 1.7–7.7)
Neutrophils Relative %: 67 %
Platelets: 290 10*3/uL (ref 150–400)
RBC: 3.61 MIL/uL — ABNORMAL LOW (ref 3.87–5.11)
RDW: 13.9 % (ref 11.5–15.5)
WBC: 7.4 10*3/uL (ref 4.0–10.5)
nRBC: 0 % (ref 0.0–0.2)

## 2020-06-01 LAB — IRON AND TIBC
Iron: 44 ug/dL (ref 28–170)
Saturation Ratios: 13 % (ref 10.4–31.8)
TIBC: 328 ug/dL (ref 250–450)
UIBC: 284 ug/dL

## 2020-06-01 LAB — FERRITIN: Ferritin: 59 ng/mL (ref 11–307)

## 2020-06-01 NOTE — Progress Notes (Signed)
[facility]  [address] [place], [place] [address] Phone: [phone]  Fax: [phone]   Clinic [name]:  06/02/2020  Referring physician: [name], MD  Chief Complaint: [name] is a 86 y.o. female with iron deficiency anemia who is seen for 4 month assessment.  HPI: The patient was last seen in the hematology clinic on 02/05/2020. At that time, she felt "fair".  She had chronic issues with CHF. Hematocrit was 31.7, hemoglobin 10.2, MCV 87.8, platelets 260,000, WBC 6,300. Creatinine was 1.01 (CrCl 50 ml/min). Calcium was 8.8. Ferritin was 75 with an iron saturation of 19% and a TIBC of 304.  The patient spoke with Jennet Maduro, RD by phone on 04/11/2020. The patient was eating very small amounts orally, nutritional needs were being met by tube feeding. She was taking Imodium every other [name] for diarrhea. No follow up was needed.  Bone density at [facility] on 04/28/2020 revealed osteoporosis with a T-score of -3.8 on the left total hip.  Labs on 04/07/2020 revealed a hemoglobin of 9.8 with a retic count of 1.8%.  Iron saturation of 11% and a TIBC of 325.  Sed rate was 33 (0-30) and a CRP of <0.5. The patient received Venofer.  During the interim, she has been "ok".  She is still having nosebleeds. She had a nurse come to her house recently who told her she has ulcers in her nose. The patient has an appointment coming up with her eye doctor. Her eyes are itchy and she uses eye drops. Her orthopnea, weakness, dizziness, and diarrhea are stable. She takes Imodium. Her leg swelling has resolved, but her feet are numb. She has been eating chicken livers.  She denies any bleeding.   Past Medical History:  Diagnosis Date  . Anemia    IRON INFUSIONS  . Aortic valve sclerosis   . Arrhythmia   . Arthritis    osteoarthritis  . Basal cell carcinoma of skin   . Brain tumor ([place])   . Brain tumor ([place])   . Cervical spine disease   . CHF (congestive  heart failure) ([place])   . Diabetes mellitus without complication ([place])   . Dysrhythmia    sinus arrhythmia  . Esophageal stricture    severe, led to feeding tube placement in Sept 2019  . Esophageal ulcer without bleeding   . Gastrostomy tube dependent ([place])    DOES NOT EAT OR DRINK   . GERD (gastroesophageal reflux disease)   . History of kidney stones   . Hyperlipemia   . Hypertension   . Kidney stones   . Leaky heart valve   . Meningioma ([place])   . Occlusive mesenteric ischemia ([place])   . Pulmonary hypertension ([place])   . RLS (restless legs syndrome)   . Vertigo     Past Surgical History:  Procedure Laterality Date  . ABDOMINAL HYSTERECTOMY    . APPENDECTOMY    . ARTHROGRAM KNEE Left   . BACK SURGERY     CERVIVAL NECK FUSION  . CATARACT EXTRACTION W/PHACO Left 11/11/2018   Procedure: CATARACT EXTRACTION PHACO AND INTRAOCULAR LENS PLACEMENT (IOC) LEFT, DIABETIC;  Surgeon: Birder Robson, MD;  Location: [facility] ORS;  Service: Ophthalmology;  Laterality: Left;  [place]  00:41 CDE 6.41 Fluid pack lot # [id number]  . COLONOSCOPY WITH PROPOFOL N/A 06/20/2017   Procedure: COLONOSCOPY WITH PROPOFOL;  Surgeon: Lollie Sails, MD;  Location: [facility] ENDOSCOPY;  Service: Endoscopy;  Laterality: N/A;  . ESOPHAGOGASTRODUODENOSCOPY (EGD)  WITH PROPOFOL N/A 03/14/2015   Procedure: ESOPHAGOGASTRODUODENOSCOPY (EGD) WITH PROPOFOL;  Surgeon: Josefine Class, MD;  Location: [facility] ENDOSCOPY;  Service: Endoscopy;  Laterality: N/A;  . ESOPHAGOGASTRODUODENOSCOPY (EGD) WITH PROPOFOL N/A 03/28/2017   Procedure: ESOPHAGOGASTRODUODENOSCOPY (EGD) WITH PROPOFOL;  Surgeon: Lollie Sails, MD;  Location: [facility] ENDOSCOPY;  Service: Endoscopy;  Laterality: N/A;  . ESOPHAGOGASTRODUODENOSCOPY (EGD) WITH PROPOFOL N/A 06/20/2017   Procedure: ESOPHAGOGASTRODUODENOSCOPY (EGD) WITH PROPOFOL;  Surgeon: Lollie Sails, MD;  Location: [facility] ENDOSCOPY;  Service: Endoscopy;  Laterality: N/A;  . ESOPHAGOGASTRODUODENOSCOPY  (EGD) WITH PROPOFOL N/A 10/21/2017   Procedure: ESOPHAGOGASTRODUODENOSCOPY (EGD) WITH PROPOFOL;  Surgeon: Lollie Sails, MD;  Location: [facility] ENDOSCOPY;  Service: Endoscopy;  Laterality: N/A;  . ESOPHAGOGASTRODUODENOSCOPY (EGD) WITH PROPOFOL N/A 04/15/2018   Procedure: ESOPHAGOGASTRODUODENOSCOPY (EGD) WITH PROPOFOL;  Surgeon: Lollie Sails, MD;  Location: [facility] ENDOSCOPY;  Service: Endoscopy;  Laterality: N/A;  . ESOPHAGUS SURGERY     CLOSURE  . EYE SURGERY    . HYSTERECTOMY ABDOMINAL WITH SALPINGECTOMY    . IR GASTROSTOMY TUBE MOD SED  06/11/2018  . IR GASTROSTOMY TUBE REMOVAL  03/25/2019  . PERIPHERAL VASCULAR CATHETERIZATION N/A 01/23/2016   Procedure: Visceral Venography;  Surgeon: Algernon Huxley, MD;  Location: [place] CV LAB;  Service: Cardiovascular;  Laterality: N/A;  . PERIPHERAL VASCULAR CATHETERIZATION  01/23/2016   Procedure: Peripheral Vascular Intervention;  Surgeon: Algernon Huxley, MD;  Location: [place] CV LAB;  Service: Cardiovascular;;  . UPPER ESOPHAGEAL ENDOSCOPIC ULTRASOUND (EUS) N/A 12/05/2017   Procedure: UPPER ESOPHAGEAL ENDOSCOPIC ULTRASOUND (EUS);  Surgeon: Jola Schmidt, MD;  Location: [facility] ENDOSCOPY;  Service: Endoscopy;  Laterality: N/A;  . VISCERAL ANGIOGRAPHY N/A 03/18/2017   Procedure: Visceral Angiography;  Surgeon: Algernon Huxley, MD;  Location: [place] CV LAB;  Service: Cardiovascular;  Laterality: N/A;  . VISCERAL ARTERY INTERVENTION N/A 03/18/2017   Procedure: Visceral Artery Intervention;  Surgeon: Algernon Huxley, MD;  Location: [place] CV LAB;  Service: Cardiovascular;  Laterality: N/A;    Family History  Problem Relation Age of Onset  . Stroke Mother   . Hypertension Mother   . Heart disease Mother   . Cancer Father   . Heart disease Father   . Heart attack Sister   . Diabetes Sister   . Heart attack Brother     Social History:  reports that she has never smoked. She has never used smokeless tobacco. She reports that she does not  drink alcohol and does not use drugs.  She is retired. She worked at [employer]. She lives in [place].Patient's contact number is [phone]. Daughter's number is [phone] [name]). The patient is accompanied by her daughter, [name], via [place] today.   Allergies:  Allergies  Allergen Reactions  . Gabapentin Swelling  . Lipitor [Atorvastatin] Other (See Comments)    Muscle aches  . Mevacor [Lovastatin] Other (See Comments)    Muscle aches  . Milk-Related Compounds Other (See Comments)    Large quantities cause headaches   . Septra [Sulfamethoxazole-Trimethoprim] Other (See Comments)    Unknown  . Statins Other (See Comments)    Muscle pain  . Sulfur Diarrhea and Nausea And Vomiting  . Zocor [Simvastatin] Other (See Comments)    Muscle aches    Current Medications: Current Outpatient Medications  Medication Sig Dispense Refill  . acetaminophen (TYLENOL) 160 MG/5ML solution Place 20.3 mLs (650 mg total) into feeding tube every 6 (six) hours as needed for mild pain. 120 mL 0  .  allopurinol (ZYLOPRIM) 100 MG tablet Place 100 mg into feeding tube daily.    . Amino Acids-Protein Hydrolys (FEEDING SUPPLEMENT, PRO-STAT SUGAR FREE 64,) LIQD Place 30 mLs into feeding tube daily. 450 mL 1  . carvedilol (COREG) 6.25 MG tablet Take 6.25 mg by mouth 2 (two) times daily with a meal.    . clopidogrel (PLAVIX) 75 MG tablet Place 1 tablet (75 mg total) into feeding tube daily. 30 tablet 6  . furosemide (LASIX) 10 MG/ML solution Place 60 mg into feeding tube daily.     [place] Kitchen glipiZIDE (GLUCOTROL) 5 MG tablet Place 1 tablet (5 mg total) into feeding tube daily before breakfast.    . hydrALAZINE (APRESOLINE) 50 MG tablet Place 1 tablet (50 mg total) into feeding tube 2 (two) times daily.    . Hypromellose (GENTEAL MILD) 0.2 % SOLN Place 1 drop into both eyes 3 (three) times daily as needed (dry eyes).     Mar[place] Kitchen lisinopril (ZESTRIL) 40 MG tablet Take 20 mg by mouth daily.     Mar[place] Kitchen  loperamide (IMODIUM) 1 MG/5ML solution Take 3 mg by mouth as needed for diarrhea or loose stools.    . meclizine (ANTIVERT) 25 MG tablet Place 25 mg into feeding tube as needed for dizziness.     . Multiple Vitamin (MULTIVITAMIN) LIQD Place 15 mLs into feeding tube daily. 450 mL 1  . Nutritional Supplements (FEEDING SUPPLEMENT, JEVITY 1.5 CAL/FIBER,) LIQD Place 237 mLs into feeding tube 4 (four) times daily. 237 mL 90  . pantoprazole sodium (PROTONIX) 40 mg/20 mL PACK Place 20 mLs (40 mg total) into feeding tube 2 (two) times daily. 30 each 1  . rOPINIRole (REQUIP) 2 MG tablet Place 1 tablet (2 mg total) into feeding tube 2 (two) times daily. 60 tablet 1  . spironolactone (ALDACTONE) 25 MG tablet Take 25 mg by mouth daily.    . Water For Irrigation, Sterile (FREE WATER) SOLN Place 30 mLs into feeding tube every 4 (four) hours.    Mar[place] Kitchen diltiazem (CARDIZEM) 120 MG tablet Take 120 mg by mouth daily.  (Patient not taking: Reported on 06/02/2020)    . vitamin C (VITAMIN C) 250 MG tablet Place 1 tablet (250 mg total) into feeding tube 2 (two) times daily. (Patient not taking: Reported on 02/05/2020) 60 tablet 1   No current facility-administered medications for this visit.    Review of Systems  Constitutional: Negative.  Negative for chills, diaphoresis, fever, malaise/fatigue and weight loss (stable).       Feels "pretty good".  HENT: Positive for nosebleeds (nose ulcers). Negative for congestion, ear discharge, ear pain, hearing loss, sinus pain, sore throat and tinnitus.   Eyes: Positive for blurred vision (vision issues, chronic) and redness (itchy eyes). Negative for double vision, photophobia and pain.  Respiratory: Negative.  Negative for cough, sputum production, shortness of breath and wheezing.   Cardiovascular: Positive for orthopnea. Negative for chest pain, palpitations, leg swelling and PND.  Gastrointestinal: Positive for diarrhea (controlled with one imodium QOD). Negative for abdominal pain,  blood in stool, constipation, heartburn, melena, nausea and vomiting.  Genitourinary: Negative.  Negative for dysuria, frequency, hematuria and urgency.  Musculoskeletal: Positive for joint pain (arthritis, chronic pain). Negative for back pain, myalgias and neck pain.  Skin: Negative.  Negative for itching and rash.  Neurological: Positive for dizziness (vertigo, on and off), sensory change (foot numbness) and weakness (generalized). Negative for tingling, focal weakness, seizures and headaches.  Endo/Heme/Allergies: Bruises/bleeds easily.  Thyroid disease on Synthroid.   Psychiatric/Behavioral: Negative.  Negative for depression and memory loss. The patient is not nervous/anxious and does not have insomnia.   All other systems reviewed and are negative.  Performance status (ECOG): 2  Vitals Blood pressure (!) 152/45, pulse 68, temperature 98.4 F (36.9 C), temperature source Tympanic, weight 116 lb 4.7 oz (52.7 kg), SpO2 98 %.   Physical Exam Vitals and nursing note reviewed.  Constitutional:      General: She is not in acute distress.    Appearance: She is well-developed. She is not diaphoretic.     Interventions: Face mask in place.     Comments: Examined in a wheelchair.  HENT:     Head: Normocephalic and atraumatic.     Comments: Gray hair.    Mouth/Throat:     Mouth: Mucous membranes are moist.     Pharynx: Oropharynx is clear.  Eyes:     General: No scleral icterus.    Pupils: Pupils are equal, round, and reactive to light.     Comments: Glasses.  Hazel eyes. Injected, hyperemia.  Neck:     Vascular: No JVD.  Cardiovascular:     Rate and Rhythm: Normal rate and regular rhythm.     Heart sounds: Normal heart sounds. No friction rub. No gallop.      Comments: Aortic stenosis murmur. Pulmonary:     Effort: Pulmonary effort is normal. No respiratory distress.     Breath sounds: No wheezing or rales.  Abdominal:     General: Bowel sounds are normal. There is no  distension.     Palpations: Abdomen is soft. There is no hepatomegaly, splenomegaly or mass.     Tenderness: There is no abdominal tenderness. There is no guarding or rebound.     Comments: G tube.  Musculoskeletal:        General: No swelling or tenderness. Normal range of motion.     Cervical back: Normal range of motion and neck supple.     Right lower leg: No edema.     Left lower leg: No edema.     Right ankle: No tenderness.  Lymphadenopathy:     Head:     Right side of head: No preauricular, posterior auricular or occipital adenopathy.     Left side of head: No preauricular, posterior auricular or occipital adenopathy.     Cervical: No cervical adenopathy.     Upper Body:     Right upper body: No supraclavicular or axillary adenopathy.     Left upper body: No supraclavicular or axillary adenopathy.     Lower Body: No right inguinal adenopathy. No left inguinal adenopathy.  Skin:    General: Skin is warm and dry.     Coloration: Skin is not pale.     Findings: Ecchymosis (upper extremity) present. No erythema or rash.  Neurological:     Mental Status: She is alert and oriented to person, place, and time.  Psychiatric:        Behavior: Behavior normal.        Thought Content: Thought content normal.        Judgment: Judgment normal.     Appointment on 06/01/2020  Component Date Value Ref Range Status  . Ferritin 06/01/2020 59  11 - 307 ng/mL Final   Performed at [facility], [address] [place], [place] [address]  . Iron 06/01/2020 44  28 - 170 ug/dL Final  . TIBC 06/01/2020 328  250 -  450 ug/dL Final  . Saturation Ratios 06/01/2020 13  10.4 - 31.8 % Final  . UIBC 06/01/2020 284  ug/dL Final   Performed at [facility], [address] [place], [place] [address]  . WBC 06/01/2020 7.4  4.0 - 10.5 K/uL Final  . RBC 06/01/2020 3.61* 3.87 - 5.11 MIL/uL Final  . Hemoglobin 06/01/2020 10.8* 12.0 - 15.0 g/dL Final  . HCT 06/01/2020 33.2* 36 - 46 %  Final  . MCV 06/01/2020 92.0  80.0 - 100.0 fL Final  . MCH 06/01/2020 29.9  26.0 - 34.0 pg Final  . MCHC 06/01/2020 32.5  30.0 - 36.0 g/dL Final  . RDW 06/01/2020 13.9  11.5 - 15.5 % Final  . Platelets 06/01/2020 290  150 - 400 K/uL Final  . nRBC 06/01/2020 0.0  0.0 - 0.2 % Final  . Neutrophils Relative % 06/01/2020 67  % Final  . Neutro Abs 06/01/2020 4.9  1.7 - 7.7 K/uL Final  . Lymphocytes Relative 06/01/2020 19  % Final  . Lymphs Abs 06/01/2020 1.4  0.7 - 4.0 K/uL Final  . Monocytes Relative 06/01/2020 5  % Final  . Monocytes Absolute 06/01/2020 0.4  0 - 1 K/uL Final  . Eosinophils Relative 06/01/2020 8  % Final  . Eosinophils Absolute 06/01/2020 0.6* 0 - 0 K/uL Final  . Basophils Relative 06/01/2020 1  % Final  . Basophils Absolute 06/01/2020 0.1  0 - 0 K/uL Final  . Immature Granulocytes 06/01/2020 0  % Final  . Abs Immature Granulocytes 06/01/2020 0.03  0.00 - 0.07 K/uL Final   Performed at [facility], [address], [place], [place] [address]    Assessment:  [name] is a 87 y.o. female withiron deficiency anemia. She has been on oral iron x 1 year with continued decline in her counts. She has dysphagia and odynophagia. She is on PPI and carafate. Dietis modest. She denies pica.  Abdomen and pelvic CTon 02/19/2017 revealed no acute abnormality. She has mild sigmoid diverticulosis.  CBCon 02/26/2017 revealed a hematocrit of 25.3, hemoglobin 8.0, and MCV 83.2. Hemoccult studiesx 1 were positive in 11/19/2016 and negative x 2 in 01/2017. Ferritin was 5 with an iron saturation of 4%, and a TIBC of 487 on 01/30/2017. Normal studies included: creatinine (1.0), calcium, albumen, B12 (647), and TSH. Folic acid was 88.5 on 08/09/2015. Urinalysis revealed no blood on 04/25/2018and 10/28/2018. SPEP was negative on 02/28/2017.  She received Venoferweekly x 3 (02/28/2017 - 03/14/2017), x 2 (04/19/2017 - 04/26/2017), x 2  (06/14/2017-06/21/2017),11/21/2017, x 3 (01/09/2018 - 01/23/2018), 02/20/2018, x 2 (08/22/2018 - 09/01/2018), 11/26/2018, x3 (02/18/2019 - 03/04/2019), x 3 (05/13/2019 - 05/29/2019), x3 (12/03/2019 - 12/18/2019), and 04/07/2020.  Ferritin has been followed: 8 on 02/28/2017, 164 on 03/19/2017, 27 on 04/18/2017, 82 on 05/17/2017, 38 on 06/10/2017, 100 on 07/18/2017, 40 on 09/18/2017, 9 on 11/19/2017, 13 on 01/02/2018, 57 on 02/19/2018, 72 on 04/04/2018, 41 on 05/21/2018, 36 on 08/18/2018,80 on 09/22/2018, 45 on 11/25/2018,96 on 01/07/2019,48 on 02/17/2019, 104 on 04/02/2019, 73 on 05/12/2019, 123 on 06/29/2019, 55 on 08/20/2019, 26 on 11/26/2019, 75 on 02/04/2020, 51 on 04/01/2020, and 59 on 06/01/2020.  EGDin 2016 revealed gastric ulcers and esophageal stricture which was dilated. Barium swallowon 11/15/2015 was normal. She has a history of mesenteric ischemia. 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 She was on Coumadin, and now on Plavix. Colonoscopyin 2008 revealed diverticulosis.   EGDon 03/28/2017 revealed a moderate-sized area of extrinsic compression was  found in the mid esophagus from about 25-28 cm, no apparent defect or abnormality in the mucosa or esophageal wall. There was a 2 cm cratered esophageal ulcer. There was segmental moderate mucosal changes characterized by granularity at the gastroesophageal junction. There was localized mild inflammation characterized by congestion (edema), depression and erythema was found on the greater curvature of the gastric body. Biopsies revealed no H pylori, dysplasia or malignancy. She is on pantoprazole and Carafate. She has esophageal spasmsimproved with Bentyl.  EGDon 06/20/2017 revealed a non-bleeding esophageal ulcer, gastritis, and a normal duodenum. Pathology revealed mild reactive gastropathy. Colonoscopyon 06/20/2017 revealed non-bleeding external hemorrhoids, diverticulosis in the sigmoid colon, and one 1 mm polyp  at the recto-sigmoid colon. Pathology revealed a tubular adenoma Negative for high grade dysplasia or malignancy.  Upper EUSon 12/05/2017 revealed esophageal stenosis. Biopsy revealed CMV viral cytopathic effect.There was no evidence of malignancy. The EUS scope could not be advanced into the stenosis as it was too tight. There was loss of normal wall layers and diffusely hypoechoic measuring from 3 mm to 6 mm in thickness.   She was treated with valacyclovir x 28 days. CMV DNA quantitative by PCR was positive on 01/02/2018 and <200 IU/ml on 01/30/2018.  Upper GI endoscopyon 04/15/2018 revealed extrinsic compression in the mid esophagus. Esophageal biopsy revealed detached stratified squamous epithelium with focal reactive parakeratosis. There was no dysplasia or malignancy. There was a benign appearing esophageal stenosis. She underwentEGD with esophageal dilatationon 05/01/2018 and 05/19/2018 at [facility].  Chest CT on 05/28/2018 revealed distal esophageal stricture at the GE junction with associated mild submucosal edema, possibly reflecting a post infectious/inflammatory stricture, underlying neoplasm not entirely excluded. There was no extrinsic compression.  She underwent PEG tube placementon 09/11/2019and exchange on 03/25/2019. She takes little by mouth.  Symptomatically, she feels "ok".  She denies any bleeding except for an interval nose bleed.  Diarrhea is controlled with one Imodium QOD.  She continues tube feeds.  She is taking little by mouth.  Exam is stable.  Plan: 1.   Review labs from 06/01/2020. 2. Iron deficiency anemia Hematocrit 29.0. Hemoglobin 9.5. MCV89.8 on 04/01/2020.  Ferritin51.  Iron saturation 11% on 04/07/2020.  Hematocrit 33.2.  Hemoglobin 10.8.  MCV 92.0 on 06/01/2020.   Ferritin 59 with an iron saturation of 13%.  Patient received a trial dose of Venofer x1 on 04/07/2020 secondary to iron saturation < 18%              Review plan for additional IV iron secondary to improvement in hemoglobin.  Alroy Bailiff for today.  Suspect some component of anemia of chronic disease.             Continue to monitor. 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 Patient'sprimary nutrition is through her G-tube.  Continue to follow. 4.   Venofer today. 5.   RTC in 2 months for labs (CBC, ferritin). 6.   RTC in 4 months for MD assessment, labs (CBC with diff, ferritin, iron studies) and +/- Venofer.  I discussed the assessment and treatment plan with the patient.  The patient was provided an opportunity to ask questions and all were answered.  The patient agreed with the plan and demonstrated an understanding of the instructions.  The patient was advised to call back if the symptoms worsen or if the condition fails to improve as anticipated.   Lequita Asal, MD, PhD    06/02/2020, 2:20 PM  I,  Mirian Mo Tufford, am acting as a Education administrator for [employer]. Mike Gip, MD.   I, Shontez Sermon C. Mike Gip, MD, have reviewed the above documentation for accuracy and completeness, and I agree with the above.

## 2020-06-02 ENCOUNTER — Inpatient Hospital Stay: Payer: Medicare Other

## 2020-06-02 ENCOUNTER — Encounter: Payer: Self-pay | Admitting: Hematology and Oncology

## 2020-06-02 ENCOUNTER — Other Ambulatory Visit: Payer: Medicare Other

## 2020-06-02 ENCOUNTER — Inpatient Hospital Stay (HOSPITAL_BASED_OUTPATIENT_CLINIC_OR_DEPARTMENT_OTHER): Payer: Medicare Other | Admitting: Hematology and Oncology

## 2020-06-02 VITALS — BP 147/52 | HR 61

## 2020-06-02 VITALS — BP 152/45 | HR 68 | Temp 98.4°F | Wt 116.3 lb

## 2020-06-02 DIAGNOSIS — D509 Iron deficiency anemia, unspecified: Secondary | ICD-10-CM

## 2020-06-02 MED ORDER — IRON SUCROSE 20 MG/ML IV SOLN
200.0000 mg | Freq: Once | INTRAVENOUS | Status: AC
  Administered 2020-06-02: 200 mg via INTRAVENOUS
  Filled 2020-06-02: qty 10

## 2020-06-02 MED ORDER — SODIUM CHLORIDE 0.9 % IV SOLN
Freq: Once | INTRAVENOUS | Status: AC
  Filled 2020-06-02: qty 250

## 2020-06-02 NOTE — Progress Notes (Signed)
The patient reports nose with dryness and nose x daily

## 2020-06-03 ENCOUNTER — Ambulatory Visit: Payer: Medicare Other | Admitting: Hematology and Oncology

## 2020-06-03 ENCOUNTER — Ambulatory Visit: Payer: Medicare Other

## 2020-06-23 ENCOUNTER — Other Ambulatory Visit (INDEPENDENT_AMBULATORY_CARE_PROVIDER_SITE_OTHER): Payer: Self-pay | Admitting: Nurse Practitioner

## 2020-06-23 DIAGNOSIS — K559 Vascular disorder of intestine, unspecified: Secondary | ICD-10-CM

## 2020-06-23 DIAGNOSIS — I739 Peripheral vascular disease, unspecified: Secondary | ICD-10-CM

## 2020-06-24 ENCOUNTER — Ambulatory Visit (INDEPENDENT_AMBULATORY_CARE_PROVIDER_SITE_OTHER): Payer: Medicare Other

## 2020-06-24 ENCOUNTER — Encounter (INDEPENDENT_AMBULATORY_CARE_PROVIDER_SITE_OTHER): Payer: Self-pay | Admitting: Vascular Surgery

## 2020-06-24 ENCOUNTER — Other Ambulatory Visit: Payer: Self-pay

## 2020-06-24 ENCOUNTER — Ambulatory Visit (INDEPENDENT_AMBULATORY_CARE_PROVIDER_SITE_OTHER): Payer: Medicare Other | Admitting: Vascular Surgery

## 2020-06-24 ENCOUNTER — Other Ambulatory Visit (INDEPENDENT_AMBULATORY_CARE_PROVIDER_SITE_OTHER): Payer: Self-pay | Admitting: Nurse Practitioner

## 2020-06-24 VITALS — BP 157/53 | HR 73 | Resp 16 | Wt 118.0 lb

## 2020-06-24 DIAGNOSIS — I739 Peripheral vascular disease, unspecified: Secondary | ICD-10-CM

## 2020-06-24 DIAGNOSIS — I10 Essential (primary) hypertension: Secondary | ICD-10-CM | POA: Diagnosis not present

## 2020-06-24 DIAGNOSIS — E785 Hyperlipidemia, unspecified: Secondary | ICD-10-CM

## 2020-06-24 DIAGNOSIS — K559 Vascular disorder of intestine, unspecified: Secondary | ICD-10-CM

## 2020-06-24 DIAGNOSIS — E119 Type 2 diabetes mellitus without complications: Secondary | ICD-10-CM

## 2020-06-24 DIAGNOSIS — K208 Other esophagitis without bleeding: Secondary | ICD-10-CM

## 2020-06-24 DIAGNOSIS — B259 Cytomegaloviral disease, unspecified: Secondary | ICD-10-CM

## 2020-06-24 NOTE — Progress Notes (Signed)
MRN : [id number]  MILLER EDGINGTON Day is a 86 y.o. ([birth year]) female who presents with chief complaint of  Chief Complaint  Patient presents with  . Follow-up    ultrasound follow up  .  History of Present Illness: Patient returns today in follow up of multiple vascular issues.  She is doing reasonably well.  She does have an upcoming dental procedure and will need to be off of her blood thinners for several days.  I would be perfectly fine with her being off anticoagulation for 5 days prior to the procedure.  She denies any current rest pain, ulceration, or infection of the lower extremities.  She does have some numbness and tingling in her feet and toes.  This is chronic and unchanged. ABIs today were noncompressible, with digit pressures of 64 on the right and 82 on the left.  She is also followed for mesenteric disease.  She is now getting fed through a feeding tube after her esophageal stricture could no longer be dilated.  She is doing well with this maintaining her weights.  She is not really having much abdominal pain.  Duplex today shows stable elevated velocities in the celiac and the SMA stent but have not changed significantly from her previous studies.  Current Outpatient Medications  Medication Sig Dispense Refill  . acetaminophen (TYLENOL) 160 MG/5ML solution Place 20.3 mLs (650 mg total) into feeding tube every 6 (six) hours as needed for mild pain. 120 mL 0  . allopurinol (ZYLOPRIM) 100 MG tablet Place 100 mg into feeding tube daily.    . Amino Acids-Protein Hydrolys (FEEDING SUPPLEMENT, PRO-STAT SUGAR FREE 64,) LIQD Place 30 mLs into feeding tube daily. 450 mL 1  . carvedilol (COREG) 6.25 MG tablet Take 6.25 mg by mouth 2 (two) times daily with a meal.    . clopidogrel (PLAVIX) 75 MG tablet Place 1 tablet (75 mg total) into feeding tube daily. 30 tablet 6  . dextromethorphan-guaiFENesin (MUCINEX DM) 30-600 MG 12hr tablet Take 1 tablet by mouth 2 (two) times daily.    .  diclofenac Sodium (VOLTAREN) 1 % GEL Apply topically 4 (four) times daily.    . furosemide (LASIX) 10 MG/ML solution Place 60 mg into feeding tube daily.     Marland Kitchen glipiZIDE (GLUCOTROL) 5 MG tablet Place 1 tablet (5 mg total) into feeding tube daily before breakfast.    . hydrALAZINE (APRESOLINE) 50 MG tablet Place 1 tablet (50 mg total) into feeding tube 2 (two) times daily.    . Hypromellose (GENTEAL MILD) 0.2 % SOLN Place 1 drop into both eyes 3 (three) times daily as needed (dry eyes).     Marland Kitchen lisinopril (ZESTRIL) 40 MG tablet Take 20 mg by mouth daily.     Marland Kitchen loperamide (IMODIUM) 1 MG/5ML solution Take 3 mg by mouth as needed for diarrhea or loose stools.    . meclizine (ANTIVERT) 25 MG tablet Place 25 mg into feeding tube as needed for dizziness.     . Multiple Vitamin (MULTIVITAMIN) LIQD Place 15 mLs into feeding tube daily. 450 mL 1  . Nutritional Supplements (FEEDING SUPPLEMENT, JEVITY 1.5 CAL/FIBER,) LIQD Place 237 mLs into feeding tube 4 (four) times daily. 237 mL 90  . Pedialyte (PEDIALYTE) SOLN Take by mouth in the morning, at noon, in the evening, and at bedtime.    Marland Kitchen rOPINIRole (REQUIP) 2 MG tablet Place 1 tablet (2 mg total) into feeding tube 2 (two) times daily. 60 tablet 1  . spironolactone (  ALDACTONE) 25 MG tablet Take 25 mg by mouth daily.    . vitamin C (VITAMIN C) 250 MG tablet Place 1 tablet (250 mg total) into feeding tube 2 (two) times daily. 60 tablet 1  . Water For Irrigation, Sterile (FREE WATER) SOLN Place 30 mLs into feeding tube every 4 (four) hours.    Marland Kitchen diltiazem (CARDIZEM) 120 MG tablet Take 120 mg by mouth daily.  (Patient not taking: Reported on 06/02/2020)    . pantoprazole sodium (PROTONIX) 40 mg/20 mL PACK Place 20 mLs (40 mg total) into feeding tube 2 (two) times daily. (Patient not taking: Reported on 06/24/2020) 30 each 1   No current facility-administered medications for this visit.    Past Medical History:  Diagnosis Date  . Anemia    IRON INFUSIONS  .  Aortic valve sclerosis   . Arrhythmia   . Arthritis    osteoarthritis  . Basal cell carcinoma of skin   . Brain tumor ([place])   . Brain tumor ([place])   . Cervical spine disease   . CHF (congestive heart failure) ([place])   . Diabetes mellitus without complication ([place])   . Dysrhythmia    sinus arrhythmia  . Esophageal stricture    severe, led to feeding tube placement in Sept 2019  . Esophageal ulcer without bleeding   . Gastrostomy tube dependent ([place])    DOES NOT EAT OR DRINK   . GERD (gastroesophageal reflux disease)   . History of kidney stones   . Hyperlipemia   . Hypertension   . Kidney stones   . Leaky heart valve   . Meningioma ([place])   . Occlusive mesenteric ischemia ([place])   . Pulmonary hypertension ([place])   . RLS (restless legs syndrome)   . Vertigo     Past Surgical History:  Procedure Laterality Date  . ABDOMINAL HYSTERECTOMY    . APPENDECTOMY    . ARTHROGRAM KNEE Left   . BACK SURGERY     CERVIVAL NECK FUSION  . CATARACT EXTRACTION W/PHACO Left 11/11/2018   Procedure: CATARACT EXTRACTION PHACO AND INTRAOCULAR LENS PLACEMENT (IOC) LEFT, DIABETIC;  Surgeon: Birder Robson, MD;  Location: [facility] ORS;  Service: Ophthalmology;  Laterality: Left;  [place]  00:41 CDE 6.41 Fluid pack lot # [id number]  . COLONOSCOPY WITH PROPOFOL N/A 06/20/2017   Procedure: COLONOSCOPY WITH PROPOFOL;  Surgeon: Lollie Sails, MD;  Location: [facility] ENDOSCOPY;  Service: Endoscopy;  Laterality: N/A;  . ESOPHAGOGASTRODUODENOSCOPY (EGD) WITH PROPOFOL N/A 03/14/2015   Procedure: ESOPHAGOGASTRODUODENOSCOPY (EGD) WITH PROPOFOL;  Surgeon: Josefine Class, MD;  Location: [facility] ENDOSCOPY;  Service: Endoscopy;  Laterality: N/A;  . ESOPHAGOGASTRODUODENOSCOPY (EGD) WITH PROPOFOL N/A 03/28/2017   Procedure: ESOPHAGOGASTRODUODENOSCOPY (EGD) WITH PROPOFOL;  Surgeon: Lollie Sails, MD;  Location: [facility] ENDOSCOPY;  Service: Endoscopy;  Laterality: N/A;  . ESOPHAGOGASTRODUODENOSCOPY (EGD) WITH PROPOFOL N/A  06/20/2017   Procedure: ESOPHAGOGASTRODUODENOSCOPY (EGD) WITH PROPOFOL;  Surgeon: Lollie Sails, MD;  Location: [facility] ENDOSCOPY;  Service: Endoscopy;  Laterality: N/A;  . ESOPHAGOGASTRODUODENOSCOPY (EGD) WITH PROPOFOL N/A 10/21/2017   Procedure: ESOPHAGOGASTRODUODENOSCOPY (EGD) WITH PROPOFOL;  Surgeon: Lollie Sails, MD;  Location: [facility] END[facility]COPY;  Service: Endoscopy;  Laterality: N/A;  . ESOPHAGOGASTRODUODENOSCOPY (EGD) WITH PROPOFOL N/A 04/15/2018   Procedure: ESOPHAGOGASTRODUODENOSCOPY (EGD) WITH PROPOFOL;  Surgeon: Lollie Sails, MD;  Location: [facility] ENDOSCOPY;  Service: Endoscopy;  Laterality: N/A;  . ESOPHAGUS SURGERY     CLOSURE  . EYE SURGERY    . HYSTERECTOMY ABDOMINAL WITH SALPINGECTOMY    . IR  GASTROSTOMY TUBE MOD SED  06/11/2018  . IR GASTROSTOMY TUBE REMOVAL  03/25/2019  . PERIPHERAL VASCULAR CATHETERIZATION N/A 01/23/2016   Procedure: Visceral Venography;  Surgeon: Algernon Huxley, MD;  Location: [place] CV LAB;  Service: Cardiovascular;  Laterality: N/A;  . PERIPHERAL VASCULAR CATHETERIZATION  01/23/2016   Procedure: Peripheral Vascular Intervention;  Surgeon: Algernon Huxley, MD;  Location: [place] CV LAB;  Service: Cardiovascular;;  . UPPER ESOPHAGEAL ENDOSCOPIC ULTRASOUND (EUS) N/A 12/05/2017   Procedure: UPPER ESOPHAGEAL ENDOSCOPIC ULTRASOUND (EUS);  Surgeon: Jola Schmidt, MD;  Location[facility]COPY;  Service: Endoscopy;  Laterality: N/A;  . VISCERAL ANGIOGRAPHY N/A 03/18/2017   Procedure: Visceral Angiography;  Surgeon: Algernon Huxley, MD;  Location: [place] CV LAB;  Service: Cardiovascular;  Laterality: N/A;  . VISCERAL ARTERY INTERVENTION N/A 03/18/2017   Procedure: Visceral Artery Intervention;  Surgeon: Algernon Huxley, MD;  Location: [place] CV LAB;  Service: Cardiovascular;  Laterality: N/A;     Social History   Tobacco Use  . Smoking status: Never Smoker  . Smokeless tobacco: Never Used  Vaping Use  . Vaping Use: Never used  Substance Use  Topics  . Alcohol use: Never  . Drug use: Never      Family History  Problem Relation Age of Onset  . Stroke Mother   . Hypertension Mother   . Heart disease Mother   . Cancer Father   . Heart disease Father   . Heart attack Sister   . Diabetes Sister   . Heart attack Brother     Allergies  Allergen Reactions  . Gabapentin Swelling  . Lipitor [Atorvastatin] Other (See Comments)    Muscle aches  . Mevacor [Lovastatin] Other (See Comments)    Muscle aches  . Milk-Related Compounds Other (See Comments)    Large quantities cause headaches   . Septra [Sulfamethoxazole-Trimethoprim] Other (See Comments)    Unknown  . Statins Other (See Comments)    Muscle pain  . Sulfur Diarrhea and Nausea And Vomiting  . Zocor [Simvastatin] Other (See Comments)    Muscle aches    REVIEW OF SYSTEMS(Negative unless checked)  Constitutional: [] ??Weight loss[] ??Fever[] ??Chills Cardiac:[] ??Chest pain[] ??Chest pressure[] ??Palpitations [] ??Shortness of breath when laying flat [] ??Shortness of breath at rest [] ??Shortness of breath with exertion. Vascular: [] ??Pain in legs with walking[] ??Pain in legsat rest[] ??Pain in legs when laying flat [] ??Claudication [] ??Pain in feet when walking [] ??Pain in feet at rest [] ??Pain in feet when laying flat [] ??History of DVT [] ??Phlebitis [] ??Swelling in legs [] ??Varicose veins [] ??Non-healing ulcers Pulmonary: [] ??Uses home oxygen [] ??Productive cough[] ??Hemoptysis [] ??Wheeze [] ??COPD [] ??Asthma Neurologic: [] ??Dizziness [] ??Blackouts [] ??Seizures [] ??History of stroke [] ??History of TIA[] ??Aphasia [] ??Temporary blindness[] ??Dysphagia [] ??Weaknessor numbness in arms [x] ??Weakness or numbnessin legs Musculoskeletal: [x] ??Arthritis [] ??Joint swelling [] ??Joint pain [] ??Low back pain Hematologic:[] ??Easy bruising[] ??Easy bleeding [] ??Hypercoagulable state [] ??Anemic   Gastrointestinal:[] ??Blood in stool[] ??Vomiting blood[x] ??Gastroesophageal reflux/heartburn[] ??Abdominal pain Genitourinary: [] ??Chronic kidney disease [] ??Difficulturination [] ??Frequenturination [] ??Burning with urination[] ??Hematuria Skin: [] ??Rashes [] ??Ulcers [] ??Wounds Psychological: [] ??History of anxiety[] ??History of major depression.   Physical Examination  BP (!) 157/53 (BP Location: Right Arm)   Pulse 73   Resp 16   Wt 118 lb (53.5 kg)   BMI 20.90 kg/m  Gen:  WD/WN, NAD Head: [place]/AT, No temporalis wasting. Ear/Nose/Throat: Hearing grossly intact, nares w/o erythema or drainage Eyes: Conjunctiva clear. Sclera non-icteric Neck: Supple.  Trachea midline Pulmonary:  Good air movement, no use of accessory muscles.  Cardiac: RRR, no JVD Vascular:  Vessel Right Left  Radial Palpable Palpable  PT 1+ Palpable 1+ Palpable  DP 1+ Palpable 1+ Palpable   Gastrointestinal: soft, non-tender/non-distended. No guarding/reflex.  Musculoskeletal: M/S 5/5 throughout.  No deformity or atrophy. Trace LE edema. Neurologic: Sensation grossly intact in extremities.  Symmetrical.  Speech is fluent.  Psychiatric: Judgment intact, Mood & affect appropriate for pt's clinical situation. Dermatologic: No rashes or ulcers noted.  No cellulitis or open wounds.       Labs Recent Results (from the past 2160 hour(s))  Ferritin     Status: None   Collection Time: 04/01/20  8:01 AM  Result Value Ref Range   Ferritin 51 11 - 307 ng/mL    Comment: Performed at [facility], [address] [place], [place] [address]  CBC with Differential/Platelet     Status: Abnormal   Collection Time: 04/01/20  8:01 AM  Result Value Ref Range   WBC 5.6 4.0 - 10.5 K/uL   RBC 3.23 (L) 3.87 - 5.11 MIL/uL   Hemoglobin 9.5 (L) 12.0 - 15.0 g/dL   HCT 29.0 (L) 36 - 46 %   MCV 89.8 80.0 - 100.0 fL   MCH 29.4 26.0 - 34.0 pg   MCHC 32.8 30.0 -  36.0 g/dL   RDW 14.9 11.5 - 15.5 %   Platelets 305 150 - 400 K/uL   nRBC 0.0 0.0 - 0.2 %   Neutrophils Relative % 63 %   Neutro Abs 3.5 1.7 - 7.7 K/uL   Lymphocytes Relative 19 %   Lymphs Abs 1.1 0.7 - 4.0 K/uL   Monocytes Relative 8 %   Monocytes Absolute 0.4 0 - 1 K/uL   Eosinophils Relative 9 %   Eosinophils Absolute 0.5 0 - 0 K/uL   Basophils Relative 1 %   Basophils Absolute 0.1 0 - 0 K/uL   Immature Granulocytes 0 %   Abs Immature Granulocytes 0.01 0.00 - 0.07 K/uL    Comment: Performed at [facility], [address], [place], [place] [address]  Reticulocytes     Status: Abnormal   Collection Time: 04/07/20  9:04 AM  Result Value Ref Range   Retic Ct Pct 1.8 0.4 - 3.1 %   RBC. 3.29 (L) 3.87 - 5.11 MIL/uL   Retic Count, Absolute 57.6 19.0 - 186.0 K/uL   Immature Retic Fract 13.9 2.3 - 15.9 %    Comment: Performed at [facility], [address] [place], [place] [address]  Hemoglobin     Status: Abnormal   Collection Time: 04/07/20  9:06 AM  Result Value Ref Range   Hemoglobin 9.8 (L) 12.0 - 15.0 g/dL    Comment: Performed at [facility] [address], [place], [place] [address]  C-reactive protein     Status: None   Collection Time: 04/07/20  9:06 AM  Result Value Ref Range   CRP <0.5 <1.0 mg/dL    Comment: Performed at [facility], [address], [place], [place] [address]  Iron and TIBC     Status: None   Collection Time: 04/07/20  9:06 AM  Result Value Ref Range   Iron 36 28 - 170 ug/dL   TIBC 325 250 - 450 ug/dL   Saturation Ratios 11 10.4 - 31.8 %   UIBC 289 ug/dL    Comment: Performed at [facility], [address] [place], [place] [address]  Sedimentation rate     Status: Abnormal   Collection Time: 04/07/20  9:06 AM  Result Value Ref  Range   Sed Rate 33 (H) 0 - 30 mm/hr    Comment: Performed at [facility], [address], [place], [place] [address]  Ferritin     Status: None    Collection Time: 06/01/20 10:11 AM  Result Value Ref Range   Ferritin 59 11 - 307 ng/mL    Comment: Performed at [facility], [address] [place], [place] [address]  Iron and TIBC     Status: None   Collection Time: 06/01/20 10:11 AM  Result Value Ref Range   Iron 44 28 - 170 ug/dL   TIBC 328 250 - 450 ug/dL   Saturation Ratios 13 10.4 - 31.8 %   UIBC 284 ug/dL    Comment: Performed at [facility], [address] [place], [place] [address]  CBC with Differential/Platelet     Status: Abnormal   Collection Time: 06/01/20 10:11 AM  Result Value Ref Range   WBC 7.4 4.0 - 10.5 K/uL   RBC 3.61 (L) 3.87 - 5.11 MIL/uL   Hemoglobin 10.8 (L) 12.0 - 15.0 g/dL   HCT 33.2 (L) 36 - 46 %   MCV 92.0 80.0 - 100.0 fL   MCH 29.9 26.0 - 34.0 pg   MCHC 32.5 30.0 - 36.0 g/dL   RDW 13.9 11.5 - 15.5 %   Platelets 290 150 - 400 K/uL   nRBC 0.0 0.0 - 0.2 %   Neutrophils Relative % 67 %   Neutro Abs 4.9 1.7 - 7.7 K/uL   Lymphocytes Relative 19 %   Lymphs Abs 1.4 0.7 - 4.0 K/uL   Monocytes Relative 5 %   Monocytes Absolute 0.4 0 - 1 K/uL   Eosinophils Relative 8 %   Eosinophils Absolute 0.6 (H) 0 - 0 K/uL   Basophils Relative 1 %   Basophils Absolute 0.1 0 - 0 K/uL   Immature Granulocytes 0 %   Abs Immature Granulocytes 0.03 0.00 - 0.07 K/uL    Comment: Performed at [facility], [address] [place], [place] [address]    Radiology No results found.  Assessment/Plan Hyperlipemia lipid control important in reducing the progression of atherosclerotic disease. Continue statin therapy   Type 2 diabetes mellitus with complication (HCC) blood glucose control important in reducing the progression of atherosclerotic disease. Also, involved in wound healing. On appropriate medications.   Hypertension blood pressure control important in reducing the progression of atherosclerotic disease. On appropriate oral medications.   Esophagitis, CMV (HCC) Has  had to have repeated dilatations. The stricture from this seems to be causing more of an issue that malperfusion, but certainly her visceral disease could be a contributing factor.  Has led to an esophageal stricture now requiring a feeding tube  Esophageal stricture Now with a feeding tube and essentially not eating.  This has maintained her weight.  PAD (peripheral artery disease) (HCC) ABIs today were noncompressible, with digit pressures of 64 on the right and 82 on the left.  Would appear to have at least mild to moderate peripheral arterial disease, but no current limb threatening symptoms.  In an 87 year old without limb threatening symptoms, I would not recommend any intervention.  Would maintain current medical regimen.  Recheck in 1 year.  Mesenteric ischemia (HCC) Duplex today shows stable elevated velocities in the celiac and the SMA stent but have not changed significantly from her previous studies.  Continue aspirin and Plavix.  Can stop Plavix 5 days prior to her dental procedure  that is scheduled for the near future.  No role for intervention at this level without significant symptoms.  Recheck in 6 months.    [name], MD  06/24/2020 9:56 AM    This note was created with Dragon medical transcription system.  Any errors from dictation are purely unintentional

## 2020-06-24 NOTE — Assessment & Plan Note (Signed)
Duplex today shows stable elevated velocities in the celiac and the SMA stent but have not changed significantly from her previous studies.  Continue aspirin and Plavix.  Can stop Plavix 5 days prior to her dental procedure that is scheduled for the near future.  No role for intervention at this level without significant symptoms.  Recheck in 6 months.

## 2020-06-24 NOTE — Patient Instructions (Signed)
Peripheral Vascular Disease  Peripheral vascular disease (PVD) is a disease of the blood vessels that are not part of your heart and brain. A simple term for PVD is poor circulation. In most cases, PVD narrows the blood vessels that carry blood from your heart to the rest of your body. This can reduce the supply of blood to your arms, legs, and internal organs, like your stomach or kidneys. However, PVD most often affects a person's lower legs and feet. Without treatment, PVD tends to get worse. PVD can also lead to acute ischemic limb. This is when an arm or leg suddenly cannot get enough blood. This is a medical emergency. Follow these instructions at home: Lifestyle  Do not use any products that contain nicotine or tobacco, such as cigarettes and e-cigarettes. If you need help quitting, ask your doctor.  Lose weight if you are overweight. Or, stay at a healthy weight as told by your doctor.  Eat a diet that is low in fat and cholesterol. If you need help, ask your doctor.  Exercise regularly. Ask your doctor for activities that are right for you. General instructions  Take over-the-counter and prescription medicines only as told by your doctor.  Take good care of your feet: ? Wear comfortable shoes that fit well. ? Check your feet often for any cuts or sores.  Keep all follow-up visits as told by your doctor This is important. Contact a doctor if:  You have cramps in your legs when you walk.  You have leg pain when you are at rest.  You have coldness in a leg or foot.  Your skin changes.  You are unable to get or have an erection (erectile dysfunction).  You have cuts or sores on your feet that do not heal. Get help right away if:  Your arm or leg turns cold, numb, and blue.  Your arms or legs become red, warm, swollen, painful, or numb.  You have chest pain.  You have trouble breathing.  You suddenly have weakness in your face, arm, or leg.  You become very  confused or you cannot speak.  You suddenly have a very bad headache.  You suddenly cannot see. Summary  Peripheral vascular disease (PVD) is a disease of the blood vessels.  A simple term for PVD is poor circulation. Without treatment, PVD tends to get worse.  Treatment may include exercise, low fat and low cholesterol diet, and quitting smoking. This information is not intended to replace advice given to you by your health care provider. Make sure you discuss any questions you have with your health care provider. Document Revised: 08/30/2017 Document Reviewed: 10/25/2016 Elsevier Patient Education  2020 [employer]  

## 2020-06-24 NOTE — Assessment & Plan Note (Signed)
ABIs today were noncompressible, with digit pressures of 64 on the right and 82 on the left.  Would appear to have at least mild to moderate peripheral arterial disease, but no current limb threatening symptoms.  In an 86 year old without limb threatening symptoms, I would not recommend any intervention.  Would maintain current medical regimen.  Recheck in 1 year.

## 2020-06-28 ENCOUNTER — Ambulatory Visit (INDEPENDENT_AMBULATORY_CARE_PROVIDER_SITE_OTHER): Payer: Medicare Other | Admitting: Vascular Surgery

## 2020-06-28 ENCOUNTER — Encounter (INDEPENDENT_AMBULATORY_CARE_PROVIDER_SITE_OTHER): Payer: Medicare Other

## 2020-07-27 NOTE — Progress Notes (Signed)
Patient ID: [name] Day, female    DOB: 1928-10-15, 86 y.o.   MRN: [id number]  HPI  Ms [name] is a 86 y/o female with a history of DM, hyperlipidemia, HTN, CKD, brain tumor, GERD, pulmonary HTN, anemia, G-tube and chronic heart failure.   Echo report from 11/24/19 reviewed and showed an EF of 20-25% along with severe MR, mod/severe TR, mild AR, moderately elevated PA pressure and large pleural effusion on the left.   Has not been admitted or been in the ED in the last 6 months.   She presents today for a follow-up visit with a chief complaint of minimal shortness of breath upon moderate exertion. She describes this as chronic in nature having been present for several years. She has associated fatigue, intermittent palpitations, light-headedness, numbness in feet/toes and gradual weight gain along with this. She denies any difficulty sleeping, abdominal distention, pedal edema (unless she doesn't wear her TED hose), chest pain or cough.   Past Medical History:  Diagnosis Date  . Anemia    IRON INFUSIONS  . Aortic valve sclerosis   . Arrhythmia   . Arthritis    osteoarthritis  . Basal cell carcinoma of skin   . Brain tumor ([place])   . Brain tumor ([place])   . Cervical spine disease   . CHF (congestive heart failure) ([place])   . Diabetes mellitus without complication ([place])   . Dysrhythmia    sinus arrhythmia  . Esophageal stricture    severe, led to feeding tube placement in Sept 2019  . Esophageal ulcer without bleeding   . Gastrostomy tube dependent ([place])    DOES NOT EAT OR DRINK   . GERD (gastroesophageal reflux disease)   . History of kidney stones   . Hyperlipemia   . Hypertension   . Kidney stones   . Leaky heart valve   . Meningioma ([place])   . Occlusive mesenteric ischemia ([place])   . Pulmonary hypertension ([place])   . RLS (restless legs syndrome)   . Vertigo    Past Surgical History:  Procedure Laterality Date  . ABDOMINAL HYSTERECTOMY    . APPENDECTOMY    .  ARTHROGRAM KNEE Left   . BACK SURGERY     CERVIVAL NECK FUSION  . CATARACT EXTRACTION W/PHACO Left 11/11/2018   Procedure: CATARACT EXTRACTION PHACO AND INTRAOCULAR LENS PLACEMENT (IOC) LEFT, DIABETIC;  Surgeon: Birder Robson, MD;  Location: [facility] ORS;  Service: Ophthalmology;  Laterality: Left;  [place]  00:41 CDE 6.41 Fluid pack lot # [id number]  . COLONOSCOPY WITH PROPOFOL N/A 06/20/2017   Procedure: COLONOSCOPY WITH PROPOFOL;  Surgeon: Lollie Sails, MD;  Location: [facility] ENDOSCOPY;  Service: Endoscopy;  Laterality: N/A;  . ESOPHAGOGASTRODUODENOSCOPY (EGD) WITH PROPOFOL N/A 03/14/2015   Procedure: ESOPHAGOGASTRODUODENOSCOPY (EGD) WITH PROPOFOL;  Surgeon: Josefine Class, MD;  Location: [facility] ENDOSCOPY;  Service: Endoscopy;  Laterality: N/A;  . ESOPHAGOGASTRODUODENOSCOPY (EGD) WITH PROPOFOL N/A 03/28/2017   Procedure: ESOPHAGOGASTRODUODENOSCOPY (EGD) WITH PROPOFOL;  Surgeon: Lollie Sails, MD;  Location: [facility] ENDOSCOPY;  Service: Endoscopy;  Laterality: N/A;  . ESOPHAGOGASTRODUODENOSCOPY (EGD) WITH PROPOFOL N/A 06/20/2017   Procedure: ESOPHAGOGASTRODUODENOSCOPY (EGD) WITH PROPOFOL;  Surgeon: Lollie Sails, MD;  Location: [facility] ENDOSCOPY;  Service: Endoscopy;  Laterality: N/A;  . ESOPHAGOGASTRODUODENOSCOPY (EGD) WITH PROPOFOL N/A 10/21/2017   Procedure: ESOPHAGOGASTRODUODENOSCOPY (EGD) WITH PROPOFOL;  Surgeon: Lollie Sails, MD;  Location: [facility] ENDOSCOPY;  Service: Endoscopy;  Laterality: N/A;  . ESOPHAGOGASTRODUODENOSCOPY (EGD) WITH PROPOFOL N/A 04/15/2018   Procedure: ESOPHAGOGASTRODUODENOSCOPY (  EGD) WITH PROPOFOL;  Surgeon: Lollie Sails, MD;  Location: [facility] ENDOSCOPY;  Service: Endoscopy;  Laterality: N/A;  . ESOPHAGUS SURGERY     CLOSURE  . EYE SURGERY    . HYSTERECTOMY ABDOMINAL WITH SALPINGECTOMY    . IR GASTROSTOMY TUBE MOD SED  06/11/2018  . IR GASTROSTOMY TUBE REMOVAL  03/25/2019  . PERIPHERAL VASCULAR CATHETERIZATION N/A 01/23/2016   Procedure: Visceral Venography;   Surgeon: Algernon Huxley, MD;  Location: [place] CV LAB;  Service: Cardiovascular;  Laterality: N/A;  . PERIPHERAL VASCULAR CATHETERIZATION  01/23/2016   Procedure: Peripheral Vascular Intervention;  Surgeon: Algernon Huxley, MD;  Location: [place] CV LAB;  Service: Cardiovascular;;  . UPPER ESOPHAGEAL ENDOSCOPIC ULTRASOUND (EUS) N/A 12/05/2017   Procedure: UPPER ESOPHAGEAL ENDOSCOPIC ULTRASOUND (EUS);  Surgeon: Jola Schmidt, MD;  Location: [facility] ENDOSCOPY;  Service: Endoscopy;  Laterality: N/A;  . VISCERAL ANGIOGRAPHY N/A 03/18/2017   Procedure: Visceral Angiography;  Surgeon: Algernon Huxley, MD;  Location: [place] CV LAB;  Service: Cardiovascular;  Laterality: N/A;  . VISCERAL ARTERY INTERVENTION N/A 03/18/2017   Procedure: Visceral Artery Intervention;  Surgeon: Algernon Huxley, MD;  Location: [place] CV LAB;  Service: Cardiovascular;  Laterality: N/A;   Family History  Problem Relation Age of Onset  . Stroke Mother   . Hypertension Mother   . Heart disease Mother   . Cancer Father   . Heart disease Father   . Heart attack Sister   . Diabetes Sister   . Heart attack Brother    Social History   Tobacco Use  . Smoking status: Never Smoker  . Smokeless tobacco: Never Used  Substance Use Topics  . Alcohol use: Never   Allergies  Allergen Reactions  . Gabapentin Swelling  . Lipitor [Atorvastatin] Other (See Comments)    Muscle aches  . Mevacor [Lovastatin] Other (See Comments)    Muscle aches  . Milk-Related Compounds Other (See Comments)    Large quantities cause headaches   . Septra [Sulfamethoxazole-Trimethoprim] Other (See Comments)    Unknown  . Statins Other (See Comments)    Muscle pain  . Sulfur Diarrhea and Nausea And Vomiting  . Zocor [Simvastatin] Other (See Comments)    Muscle aches   Prior to Admission medications   Medication Sig Start Date End Date Taking? Authorizing Provider  acetaminophen (TYLENOL) 160 MG/5ML solution Place 20.3 mLs (650 mg total)  into feeding tube every 6 (six) hours as needed for mild pain. 06/13/18  Yes Demetrios Loll, MD  allopurinol (ZYLOPRIM) 100 MG tablet Place 100 mg into feeding tube daily.   Yes [provider]  Amino Acids-Protein Hydrolys (FEEDING SUPPLEMENT, PRO-STAT SUGAR FREE 64,) LIQD Place 30 mLs into feeding tube daily. 06/13/18  Yes Demetrios Loll, MD  carvedilol (COREG) 6.25 MG tablet Take 6.25 mg by mouth 2 (two) times daily with a meal.   Yes [provider]  clopidogrel (PLAVIX) 75 MG tablet Place 1 tablet (75 mg total) into feeding tube daily. 01/12/20  Yes Kris Hartmann, NP  dextromethorphan-guaiFENesin (MUCINEX DM) 30-600 MG 12hr tablet Take 1 tablet by mouth 2 (two) times daily.   Yes [provider]  diltiazem (CARDIZEM) 120 MG tablet Take 120 mg by mouth daily.  08/16/19  Yes [provider]  furosemide (LASIX) 10 MG/ML solution Place 60 mg into feeding tube daily.    Yes [provider]  glipiZIDE (GLUCOTROL) 5 MG tablet Place 1 tablet (5 mg total) into feeding tube daily  before breakfast. 06/13/18  Yes Demetrios Loll, MD  hydrALAZINE (APRESOLINE) 50 MG tablet Place 1 tablet (50 mg total) into feeding tube 2 (two) times daily. 06/13/18  Yes Demetrios Loll, MD  Hypromellose [facility] MILD) 0.2 % SOLN Place 1 drop into both eyes 3 (three) times daily as needed (dry eyes).    Yes [provider]  lisinopril (ZESTRIL) 40 MG tablet Take 20 mg by mouth daily.    Yes [provider]  loperamide (IMODIUM) 1 MG/5ML solution Take 3 mg by mouth as needed for diarrhea or loose stools.   Yes [provider]  meclizine (ANTIVERT) 25 MG tablet Place 25 mg into feeding tube as needed for dizziness.    Yes [provider]  Multiple Vitamin (MULTIVITAMIN) LIQD Place 15 mLs into feeding tube daily. 06/13/18  Yes Demetrios Loll, MD  Nutritional Supplements (FEEDING SUPPLEMENT, JEVITY 1.5 CAL/FIBER,) LIQD Place 237 mLs into feeding tube 4 (four) times daily.  06/25/18  Yes Dustin Flock, MD  Pedialyte (PEDIALYTE) SOLN Take by mouth in the morning, at noon, in the evening, and at bedtime.   Yes [provider]  rOPINIRole (REQUIP) 2 MG tablet Place 1 tablet (2 mg total) into feeding tube 2 (two) times daily. 06/13/18  Yes Demetrios Loll, MD  spironolactone (ALDACTONE) 25 MG tablet Take 25 mg by mouth daily. 12/04/19  Yes [provider]  vitamin C (VITAMIN C) 250 MG tablet Place 1 tablet (250 mg total) into feeding tube 2 (two) times daily. 06/13/18  Yes Demetrios Loll, MD  Water For Irrigation, Sterile (FREE WATER) SOLN Place 30 mLs into feeding tube every 4 (four) hours. 06/25/18  Yes Dustin Flock, MD  diclofenac Sodium (VOLTAREN) 1 % GEL Apply topically 4 (four) times daily. Patient not taking: Reported on 07/28/2020    [provider]  pantoprazole sodium (PROTONIX) 40 mg/20 mL PACK Place 20 mLs (40 mg total) into feeding tube 2 (two) times daily. Patient not taking: Reported on 06/24/2020 06/25/18   Dustin Flock, MD    Review of Systems  Constitutional: Positive for fatigue. Negative for appetite change.  HENT: Positive for congestion. Negative for postnasal drip and sore throat.   Eyes: Negative.   Respiratory: Positive for shortness of breath. Negative for cough and chest tightness.   Cardiovascular: Positive for palpitations (at times). Negative for chest pain and leg swelling.  Gastrointestinal: Positive for diarrhea (after tube feeds). Negative for abdominal distention and abdominal pain.  Endocrine: Negative.   Genitourinary: Negative.   Musculoskeletal: Negative for back pain and neck pain.  Skin: Negative.   Allergic/Immunologic: Negative.   Neurological: Positive for light-headedness and numbness (in feet/toes). Negative for dizziness.  Hematological: Negative for adenopathy. Bruises/bleeds easily.  Psychiatric/Behavioral: Negative for dysphoric mood and sleep disturbance (sleeping on 3 pillows). The patient is  not nervous/anxious.    Vitals:   07/28/20 0820  BP: 138/63  Pulse: (!) 56  Resp: 16  SpO2: 98%  Weight: 118 lb 6 oz (53.7 kg)  Height: 5\' 2"  (1.575 m)   Wt Readings from Last 3 Encounters:  07/28/20 118 lb 6 oz (53.7 kg)  06/24/20 118 lb (53.5 kg)  06/02/20 116 lb 4.7 oz (52.7 kg)   Lab Results  Component Value Date   CREATININE 1.01 (H) 02/04/2020   CREATININE 1.43 (H) 12/01/2019   CREATININE 1.06 (H) 11/26/2019    Physical Exam Vitals and nursing note reviewed.  Constitutional:      Appearance: She is well-developed.  HENT:  Head: Normocephalic and atraumatic.  Neck:     Vascular: No JVD.  Cardiovascular:     Rate and Rhythm: Regular rhythm. Bradycardia present.  Pulmonary:     Effort: Pulmonary effort is normal. No respiratory distress.     Breath sounds: No wheezing or rales.  Abdominal:     Palpations: Abdomen is soft.     Tenderness: There is no abdominal tenderness.  Musculoskeletal:     Cervical back: Normal range of motion and neck supple.     Right lower leg: No tenderness. No edema.     Left lower leg: No tenderness. No edema.  Skin:    General: Skin is warm and dry.  Neurological:     General: No focal deficit present.     Mental Status: She is alert and oriented to person, place, and time.  Psychiatric:        Mood and Affect: Mood normal.        Behavior: Behavior normal.     Assessment & Plan:  1: Chronic heart failure with reduced ejection fraction- - NYHA class II - euvolemic today - weighing but not daily; reminded to call for an overnight weight gain of >2 pounds or a weekly weight gain of >5 pounds - weight up 5 pounds from last visit here 6 months ago - saw cardiology Minette Brine) 03/31/20 - bradycardic so unable to titrate carvedilol - BNP 11/23/19 was 1969.0 - has home health nurse from [place] coming - does wear compression socks and elevates her legs  2: HTN- - BP looks good today although patient says that this is high for  her - saw PCP (Hande) 03/31/20 & returns next week - BMP 06/09/20 reviewed and showed sodium 145, potassium 4.2, creatinine 1.0 and GFR 52  3: DM- - A1c 03/24/20 was 7.2% - fasting glucose in clinic today was 128 - saw endocrinology Pasty Arch) 06/09/20  4: Anemia- - had iron infusion 12/18/19 - saw Dr. Mike Gip on 06/02/20  5: Esophageal stenosis- - saw GI Jacqulyn Liner) 04/06/20 - gets all feeding through G-tube   Patient did not bring her medications nor a list. Each medication was verbally reviewed with the patient and she was encouraged to bring the bottles to every visit to confirm accuracy of list.  Due to HF stability, will not schedule another appointment for patient at this time. Advised patient and her daughter that she could call back at anytime to schedule another appointment and they were both comfortable with this plan.

## 2020-07-28 ENCOUNTER — Other Ambulatory Visit: Payer: Self-pay

## 2020-07-28 ENCOUNTER — Ambulatory Visit: Payer: Medicare Other | Attending: Family | Admitting: Family

## 2020-07-28 ENCOUNTER — Encounter: Payer: Self-pay | Admitting: Family

## 2020-07-28 VITALS — BP 138/63 | HR 56 | Resp 16 | Ht 62.0 in | Wt 118.4 lb

## 2020-07-28 DIAGNOSIS — Z7984 Long term (current) use of oral hypoglycemic drugs: Secondary | ICD-10-CM | POA: Insufficient documentation

## 2020-07-28 DIAGNOSIS — E785 Hyperlipidemia, unspecified: Secondary | ICD-10-CM | POA: Diagnosis not present

## 2020-07-28 DIAGNOSIS — Z87442 Personal history of urinary calculi: Secondary | ICD-10-CM | POA: Diagnosis not present

## 2020-07-28 DIAGNOSIS — N1831 Chronic kidney disease, stage 3a: Secondary | ICD-10-CM | POA: Diagnosis not present

## 2020-07-28 DIAGNOSIS — K222 Esophageal obstruction: Secondary | ICD-10-CM | POA: Diagnosis not present

## 2020-07-28 DIAGNOSIS — Z882 Allergy status to sulfonamides status: Secondary | ICD-10-CM | POA: Diagnosis not present

## 2020-07-28 DIAGNOSIS — I5022 Chronic systolic (congestive) heart failure: Secondary | ICD-10-CM | POA: Diagnosis not present

## 2020-07-28 DIAGNOSIS — Z931 Gastrostomy status: Secondary | ICD-10-CM | POA: Insufficient documentation

## 2020-07-28 DIAGNOSIS — Z8249 Family history of ischemic heart disease and other diseases of the circulatory system: Secondary | ICD-10-CM | POA: Insufficient documentation

## 2020-07-28 DIAGNOSIS — D509 Iron deficiency anemia, unspecified: Secondary | ICD-10-CM

## 2020-07-28 DIAGNOSIS — E1122 Type 2 diabetes mellitus with diabetic chronic kidney disease: Secondary | ICD-10-CM | POA: Insufficient documentation

## 2020-07-28 DIAGNOSIS — Z79899 Other long term (current) drug therapy: Secondary | ICD-10-CM | POA: Diagnosis not present

## 2020-07-28 DIAGNOSIS — K219 Gastro-esophageal reflux disease without esophagitis: Secondary | ICD-10-CM | POA: Diagnosis not present

## 2020-07-28 DIAGNOSIS — I130 Hypertensive heart and chronic kidney disease with heart failure and stage 1 through stage 4 chronic kidney disease, or unspecified chronic kidney disease: Secondary | ICD-10-CM | POA: Diagnosis not present

## 2020-07-28 DIAGNOSIS — D631 Anemia in chronic kidney disease: Secondary | ICD-10-CM | POA: Diagnosis not present

## 2020-07-28 DIAGNOSIS — I10 Essential (primary) hypertension: Secondary | ICD-10-CM

## 2020-07-28 DIAGNOSIS — I272 Pulmonary hypertension, unspecified: Secondary | ICD-10-CM | POA: Insufficient documentation

## 2020-07-28 LAB — GLUCOSE, CAPILLARY: Glucose-Capillary: 128 mg/dL — ABNORMAL HIGH (ref 70–99)

## 2020-07-28 NOTE — Patient Instructions (Addendum)
Continue weighing daily and call for an overnight weight gain of > 2 pounds or a weekly weight gain of >5 pounds.   Call us in the future if you'd to schedule another appointment.

## 2020-08-02 ENCOUNTER — Other Ambulatory Visit: Payer: Medicare Other

## 2020-08-02 ENCOUNTER — Other Ambulatory Visit: Payer: Self-pay

## 2020-08-02 ENCOUNTER — Inpatient Hospital Stay: Payer: Medicare Other | Attending: Hematology and Oncology

## 2020-08-02 DIAGNOSIS — D509 Iron deficiency anemia, unspecified: Secondary | ICD-10-CM | POA: Diagnosis not present

## 2020-08-02 LAB — CBC
HCT: 32 % — ABNORMAL LOW (ref 36.0–46.0)
Hemoglobin: 10.4 g/dL — ABNORMAL LOW (ref 12.0–15.0)
MCH: 30.2 pg (ref 26.0–34.0)
MCHC: 32.5 g/dL (ref 30.0–36.0)
MCV: 93 fL (ref 80.0–100.0)
Platelets: 232 10*3/uL (ref 150–400)
RBC: 3.44 MIL/uL — ABNORMAL LOW (ref 3.87–5.11)
RDW: 14.3 % (ref 11.5–15.5)
WBC: 6.4 10*3/uL (ref 4.0–10.5)
nRBC: 0 % (ref 0.0–0.2)

## 2020-08-02 LAB — FERRITIN: Ferritin: 79 ng/mL (ref 11–307)

## 2020-09-14 ENCOUNTER — Other Ambulatory Visit (INDEPENDENT_AMBULATORY_CARE_PROVIDER_SITE_OTHER): Payer: Self-pay | Admitting: Nurse Practitioner

## 2020-09-14 DIAGNOSIS — I739 Peripheral vascular disease, unspecified: Secondary | ICD-10-CM

## 2020-10-03 NOTE — Progress Notes (Signed)
[facility]  [address] [place], [place] [address] Phone: [phone]  Fax: [phone]   Clinic Day:  10/04/2020  Referring physician: Barbette Reichmann, MD  Chief Complaint: [name] Day is a 87 y.o. female with iron deficiency anemia who is seen for 4 month assessment.  HPI: The patient was last seen in the hematology clinic on 06/02/2020. At that time,  she felt "ok".  She denied any bleeding except for an interval nose bleed.  Diarrhea was controlled with one Imodium QOD.  She continued tube feeds.  She was taking little by mouth.  Exam was stable. Hematocrit was 33.2, hemoglobin 10.8, MCV 92.0, platelets 290,000, WBC 7,400. Ferritin was 59 with an iron saturation of 13% and a TIBC of 328. She received Venofer.   The patient saw Dr. Wyn Quaker on 06/24/2020. She was felt to have mild to moderate peripheral artery disease but no current limb threatening situations. She was cleared to stop Plavix for 5 days before an upcoming dental procedure.  The patient saw Vevelyn Pat, NP on 09/19/2020.  Reflux was stable.  Recommendations were to slow the rate of her feedings.  Loperamide was recommended daily to decrease her chronic functional diarrhea.  She has a follow-up in 1 year.  She received her first infusion of Reclast on 09/29/2020 with Dr Verdis Frederickson in endocrinology.  Labs on 08/02/2020 revealed a hematocrit of 32.0, hemoglobin 10.4, MCV 93.0, platelets 232,000, WBC 6,400. Ferritin was 79.  During the interim, she has been "good." She still has occasional nosebleeds but they occur less often. She takes a small dose of imodium everyday which works for her. She has occasional vertigo. She is able to perform ADLs independently.   She still uses a feeding tube and tries to eat a little bit by mouth. She states that sometimes she can eat small bites of soft foods but sometimes she cannot. She denies blood in the stool and black stool.  She had a  tooth pulled recently.   Past Medical History:  Diagnosis Date  . Anemia    IRON INFUSIONS  . Aortic valve sclerosis   . Arrhythmia   . Arthritis    osteoarthritis  . Basal cell carcinoma of skin   . Brain tumor (HCC)   . Brain tumor (HCC)   . Cervical spine disease   . CHF (congestive heart failure) (HCC)   . Diabetes mellitus without complication (HCC)   . Dysrhythmia    sinus arrhythmia  . Esophageal stricture    severe, led to feeding tube placement in Sept 2019  . Esophageal ulcer without bleeding   . Gastrostomy tube dependent (HCC)    DOES NOT EAT OR DRINK   . GERD (gastroesophageal reflux disease)   . History of kidney stones   . Hyperlipemia   . Hypertension   . Kidney stones   . Leaky heart valve   . Meningioma (HCC)   . Occlusive mesenteric ischemia (HCC)   . Pulmonary hypertension (HCC)   . RLS (restless legs syndrome)   . Vertigo     Past Surgical History:  Procedure Laterality Date  . ABDOMINAL HYSTERECTOMY    . APPENDECTOMY    . ARTHROGRAM KNEE Left   . BACK SURGERY     CERVIVAL NECK FUSION  . CATARACT EXTRACTION W/PHACO Left 11/11/2018   Procedure: CATARACT EXTRACTION PHACO AND INTRAOCULAR LENS PLACEMENT (IOC) LEFT, DIABETIC;  Surgeon: Galen Manila, MD;  Location: [facility] ORS;  Service: Ophthalmology;  Laterality: Left;  [place]  00:41 CDE 6.41 Fluid pack lot # [id number]  . COLONOSCOPY WITH PROPOFOL N/A 06/20/2017   Procedure: COLONOSCOPY WITH PROPOFOL;  Surgeon: Lollie Sails, MD;  Location: [facility] ENDOSCOPY;  Service: Endoscopy;  Laterality: N/A;  . ESOPHAGOGASTRODUODENOSCOPY (EGD) WITH PROPOFOL N/A 03/14/2015   Procedure: ESOPHAGOGASTRODUODENOSCOPY (EGD) WITH PROPOFOL;  Surgeon: Josefine Class, MD;  Location: [facility] ENDOSCOPY;  Service: Endoscopy;  Laterality: N/A;  . ESOPHAGOGASTRODUODENOSCOPY (EGD) WITH PROPOFOL N/A 03/28/2017   Procedure: ESOPHAGOGASTRODUODENOSCOPY (EGD) WITH PROPOFOL;  Surgeon: Lollie Sails, MD;  Location: [facility]  ENDOSCOPY;  Service: Endoscopy;  Laterality: N/A;  . ESOPHAGOGASTRODUODENOSCOPY (EGD) WITH PROPOFOL N/A 06/20/2017   Procedure: ESOPHAGOGASTRODUODENOSCOPY (EGD) WITH PROPOFOL;  Surgeon: Lollie Sails, MD;  Location: [facility] ENDOSCOPY;  Service: Endoscopy;  Laterality: N/A;  . ESOPHAGOGASTRODUODENOSCOPY (EGD) WITH PROPOFOL N/A 10/21/2017   Procedure: ESOPHAGOGASTRODUODENOSCOPY (EGD) WITH PROPOFOL;  Surgeon: Lollie Sails, MD;  Location: [facility] ENDOSCOPY;  Service: Endoscopy;  Laterality: N/A;  . ESOPHAGOGASTRODUODENOSCOPY (EGD) WITH PROPOFOL N/A 04/15/2018   Procedure: ESOPHAGOGASTRODUODENOSCOPY (EGD) WITH PROPOFOL;  Surgeon: Lollie Sails, MD;  Location: [facility] ENDOSCOPY;  Service: Endoscopy;  Laterality: N/A;  . ESOPHAGUS SURGERY     CLOSURE  . EYE SURGERY    . HYSTERECTOMY ABDOMINAL WITH SALPINGECTOMY    . IR GASTROSTOMY TUBE MOD SED  06/11/2018  . IR GASTROSTOMY TUBE REMOVAL  03/25/2019  . PERIPHERAL VASCULAR CATHETERIZATION N/A 01/23/2016   Procedure: Visceral Venography;  Surgeon: Algernon Huxley, MD;  Location: [facility] LAB;  Service: Cardiovascular;  Laterality: N/A;  . PERIPHERAL VASCULAR CATHETERIZATION  01/23/2016   Procedure: Peripheral Vascular Intervention;  Surgeon: Algernon Huxley, MD;  Location: [facility];  Service: Cardiovascular;;  . UPPER ESOPHAGEAL ENDOSCOPIC ULTRASOUND (EUS) N/A 12/05/2017   Procedure: UPPER ESOPHAGEAL ENDOSCOPIC ULTRASOUND (EUS);  Surgeon: Jola Schmidt, MD;  Location: [facility] ENDOSCOPY;  Service: Endoscopy;  Laterality: N/A;  . VISCERAL ANGIOGRAPHY N/A 03/18/2017   Procedure: Visceral Angiography;  Surgeon: Algernon Huxley, MD;  Location: [facility];  Service: Cardiovascular;  Laterality: N/A;  . VISCERAL ARTERY INTERVENTION N/A 03/18/2017   Procedure: Visceral Artery Intervention;  Surgeon: Algernon Huxley, MD;  Location: [place] [facility] LAB;  Service: Cardiovascular;  Laterality: N/A;    Family History  Problem Relation Age of Onset  .  Stroke Mother   . Hypertension Mother   . Heart disease Mother   . Cancer Father   . Heart disease Father   . Heart attack Sister   . Diabetes Sister   . Heart attack Brother     Social History:  reports that she has never smoked. She has never used smokeless tobacco. She reports that she does not drink alcohol and does not use drugs.  She is retired. She worked at [employer]. She lives in [place].Patient's contact number is [phone]. Daughter's number is [phone] [name]). The patient is accompanied by her daughter, [name] today.  Allergies:  Allergies  Allergen Reactions  . Gabapentin Swelling  . Lipitor [Atorvastatin] Other (See Comments)    Muscle aches  . Mevacor [Lovastatin] Other (See Comments)    Muscle aches  . Milk-Related Compounds Other (See Comments)    Large quantities cause headaches   . Septra [Sulfamethoxazole-Trimethoprim] Other (See Comments)    Unknown  . Statins Other (See Comments)    Muscle pain  . Sulfur Diarrhea and Nausea And Vomiting  . Zocor [Simvastatin] Other (See Comments)  Muscle aches    Current Medications: Current Outpatient Medications  Medication Sig Dispense Refill  . acetaminophen (TYLENOL) 160 MG/5ML solution Place 20.3 mLs (650 mg total) into feeding tube every 6 (six) hours as needed for mild pain. 120 mL 0  . allopurinol (ZYLOPRIM) 100 MG tablet Place 100 mg into feeding tube daily.    . Amino Acids-Protein Hydrolys (FEEDING SUPPLEMENT, PRO-STAT SUGAR FREE 64,) LIQD Place 30 mLs into feeding tube daily. 450 mL 1  . carvedilol (COREG) 6.25 MG tablet Take 6.25 mg by mouth 2 (two) times daily with a meal.    . clopidogrel (PLAVIX) 75 MG tablet GIVE 1 TABLET VIA FEEDING TUBE EVERY DAY AS DIRECTED 30 tablet 6  . dextromethorphan-guaiFENesin (MUCINEX DM) 30-600 MG 12hr tablet Take 1 tablet by mouth 2 (two) times daily.    . diclofenac Sodium (VOLTAREN) 1 % GEL Apply topically 4 (four) times daily.    Marland Kitchen  diltiazem (CARDIZEM) 120 MG tablet Take 120 mg by mouth daily.     . furosemide (LASIX) 10 MG/ML solution Place 60 mg into feeding tube daily.     Marland Kitchen glipiZIDE (GLUCOTROL) 5 MG tablet Place 1 tablet (5 mg total) into feeding tube daily before breakfast.    . hydrALAZINE (APRESOLINE) 50 MG tablet Place 1 tablet (50 mg total) into feeding tube 2 (two) times daily.    . Hypromellose 0.2 % SOLN Place 1 drop into both eyes 3 (three) times daily as needed (dry eyes).     Marland Kitchen lisinopril (ZESTRIL) 40 MG tablet Take 20 mg by mouth daily.     Marland Kitchen loperamide (IMODIUM) 1 MG/5ML solution Take 3 mg by mouth as needed for diarrhea or loose stools.    . meclizine (ANTIVERT) 25 MG tablet Place 25 mg into feeding tube as needed for dizziness.     . Multiple Vitamin (MULTIVITAMIN) LIQD Place 15 mLs into feeding tube daily. 450 mL 1  . Nutritional Supplements (FEEDING SUPPLEMENT, JEVITY 1.5 CAL/FIBER,) LIQD Place 237 mLs into feeding tube 4 (four) times daily. 237 mL 90  . Pedialyte (PEDIALYTE) SOLN Take by mouth in the morning, at noon, in the evening, and at bedtime.    Marland Kitchen rOPINIRole (REQUIP) 2 MG tablet Place 1 tablet (2 mg total) into feeding tube 2 (two) times daily. 60 tablet 1  . spironolactone (ALDACTONE) 25 MG tablet Take 25 mg by mouth daily.    . vitamin C (VITAMIN C) 250 MG tablet Place 1 tablet (250 mg total) into feeding tube 2 (two) times daily. 60 tablet 1  . Water For Irrigation, Sterile (FREE WATER) SOLN Place 30 mLs into feeding tube every 4 (four) hours.    . pantoprazole sodium (PROTONIX) 40 mg/20 mL PACK Place 20 mLs (40 mg total) into feeding tube 2 (two) times daily. (Patient not taking: No sig reported) 30 each 1   No current facility-administered medications for this visit.    Review of Systems  Constitutional: Negative.  Negative for chills, diaphoresis, fever, malaise/fatigue and weight loss (5 lbs).       Feels "good".  HENT: Positive for nosebleeds (occasional, improved). Negative for  congestion, ear discharge, ear pain, hearing loss, sinus pain, sore throat and tinnitus.   Eyes: Positive for blurred vision (vision issues, chronic). Negative for double vision and pain.  Respiratory: Negative.  Negative for cough, sputum production, shortness of breath and wheezing.   Cardiovascular: Positive for orthopnea. Negative for chest pain, palpitations, leg swelling and PND.  Gastrointestinal: Negative for  abdominal pain, blood in stool, constipation, diarrhea, heartburn, melena, nausea and vomiting.  Genitourinary: Negative.  Negative for dysuria, frequency, hematuria and urgency.  Musculoskeletal: Positive for joint pain (arthritis). Negative for back pain, myalgias and neck pain.  Skin: Negative.  Negative for itching and rash.  Neurological: Positive for dizziness (occasional vertigo). Negative for tingling, sensory change, focal weakness, weakness and headaches.  Endo/Heme/Allergies: Bruises/bleeds easily.       Thyroid disease on Synthroid.   Psychiatric/Behavioral: Negative.  Negative for depression and memory loss. The patient is not nervous/anxious and does not have insomnia.   All other systems reviewed and are negative.  Performance status (ECOG): 2  Vitals Blood pressure (!) 147/46, pulse (!) 50, temperature (!) 97 F (36.1 C), temperature source Tympanic, resp. rate 16, weight 113 lb 12.1 oz (51.6 kg), SpO2 100 %.   Physical Exam Vitals and nursing note reviewed.  Constitutional:      General: She is not in acute distress.    Appearance: She is well-developed. She is not diaphoretic.     Interventions: Face mask in place.     Comments: Able to get onto table for exam.  HENT:     Head: Normocephalic and atraumatic.     Comments: Gray hair.    Mouth/Throat:     Mouth: Mucous membranes are moist.     Pharynx: Oropharynx is clear.  Eyes:     General: No scleral icterus.    Pupils: Pupils are equal, round, and reactive to light.     Comments: Glasses.  Hazel  eyes. Injected, hyperemia.  Neck:     Vascular: No JVD.  Cardiovascular:     Rate and Rhythm: Normal rate and regular rhythm.     Heart sounds: Normal heart sounds.     Comments: Aortic stenosis murmur. Pulmonary:     Effort: Pulmonary effort is normal. No respiratory distress.     Breath sounds: Normal breath sounds. No wheezing or rales.  Chest:     Chest wall: No tenderness.  Breasts:     Right: No axillary adenopathy or supraclavicular adenopathy.     Left: No axillary adenopathy or supraclavicular adenopathy.    Abdominal:     General: Bowel sounds are normal. There is no distension.     Palpations: Abdomen is soft. There is no hepatomegaly, splenomegaly or mass.     Tenderness: There is no abdominal tenderness. There is no guarding or rebound.     Comments: G tube.  Musculoskeletal:        General: No swelling or tenderness. Normal range of motion.     Cervical back: Normal range of motion and neck supple.     Right lower leg: No edema.     Left lower leg: No edema.     Right ankle: No tenderness.  Lymphadenopathy:     Head:     Right side of head: No preauricular, posterior auricular or occipital adenopathy.     Left side of head: No preauricular, posterior auricular or occipital adenopathy.     Cervical: No cervical adenopathy.     Upper Body:     Right upper body: No supraclavicular or axillary adenopathy.     Left upper body: No supraclavicular or axillary adenopathy.     Lower Body: No right inguinal adenopathy. No left inguinal adenopathy.  Skin:    General: Skin is warm and dry.     Coloration: Skin is not pale.     Findings: No erythema or  rash.  Neurological:     Mental Status: She is alert and oriented to person, place, and time.  Psychiatric:        Behavior: Behavior normal.        Thought Content: Thought content normal.        Judgment: Judgment normal.     Appointment on 10/04/2020  Component Date Value Ref Range Status  . WBC 10/04/2020 6.1   4.0 - 10.5 K/uL Final  . RBC 10/04/2020 3.63* 3.87 - 5.11 MIL/uL Final  . Hemoglobin 10/04/2020 10.6* 12.0 - 15.0 g/dL Final  . HCT 10/04/2020 33.1* 36.0 - 46.0 % Final  . MCV 10/04/2020 91.2  80.0 - 100.0 fL Final  . MCH 10/04/2020 29.2  26.0 - 34.0 pg Final  . MCHC 10/04/2020 32.0  30.0 - 36.0 g/dL Final  . RDW 10/04/2020 13.7  11.5 - 15.5 % Final  . Platelets 10/04/2020 269  150 - 400 K/uL Final  . nRBC 10/04/2020 0.0  0.0 - 0.2 % Final  . Neutrophils Relative % 10/04/2020 68  % Final  . Neutro Abs 10/04/2020 4.2  1.7 - 7.7 K/uL Final  . Lymphocytes Relative 10/04/2020 16  % Final  . Lymphs Abs 10/04/2020 1.0  0.7 - 4.0 K/uL Final  . Monocytes Relative 10/04/2020 7  % Final  . Monocytes Absolute 10/04/2020 0.4  0.1 - 1.0 K/uL Final  . Eosinophils Relative 10/04/2020 8  % Final  . Eosinophils Absolute 10/04/2020 0.5  0.0 - 0.5 K/uL Final  . Basophils Relative 10/04/2020 1  % Final  . Basophils Absolute 10/04/2020 0.0  0.0 - 0.1 K/uL Final  . Immature Granulocytes 10/04/2020 0  % Final  . Abs Immature Granulocytes 10/04/2020 0.02  0.00 - 0.07 K/uL Final   Performed at [facility], [address] [place], [place] [address]    Assessment:  [name] Day is a 87 y.o. female withiron deficiency anemia. She has been on oral iron x 1 year with continued decline in her counts. She has dysphagia and odynophagia. She is on PPI and carafate. Dietis modest. She denies pica.  Abdomen and pelvic CTon 02/19/2017 revealed no acute abnormality. She has mild sigmoid diverticulosis.  CBCon 02/26/2017 revealed a hematocrit of 25.3, hemoglobin 8.0, and MCV 83.2. Hemoccult studiesx 1 were positive in 11/19/2016 and negative x 2 in 01/2017. Ferritin was 5 with an iron saturation of 4%, and a TIBC of 487 on 01/30/2017. Normal studies included: creatinine (1.0), calcium, albumen, B12 (647), and TSH. Folic acid was [id number] on 08/09/2015. Urinalysis revealed no blood on  04/25/2018and 10/28/2018. SPEP was negative on 02/28/2017.  She received Venoferweekly x 3 (02/28/2017 - 03/14/2017), x 2 (04/19/2017 - 04/26/2017), x 2 (06/14/2017-06/21/2017),11/21/2017, x 3 (01/09/2018 - 01/23/2018), 02/20/2018, x 2 (08/22/2018 - 09/01/2018), 11/26/2018, x3 (02/18/2019 - 03/04/2019), x 3 (05/13/2019 - 05/29/2019), x3 (12/03/2019 - 12/18/2019), 04/07/2020, and 06/02/2020.  Ferritin has been followed: 8 on 02/28/2017, 164 on 03/19/2017, 27 on 04/18/2017, 82 on 05/17/2017, 38 on 06/10/2017, 100 on 07/18/2017, 40 on 09/18/2017, 9 on 11/19/2017, 13 on 01/02/2018, 57 on 02/19/2018, 72 on 04/04/2018, 41 on 05/21/2018, 36 on 08/18/2018,80 on 09/22/2018, 45 on 11/25/2018,96 on 01/07/2019,48 on 02/17/2019, 104 on 04/02/2019, 73 on 05/12/2019, 123 on 06/29/2019, 55 on 08/20/2019, 26 on 11/26/2019, 75 on 02/04/2020, 51 on 04/01/2020, and 59 on 06/01/2020.  EGDin 2016 revealed gastric ulcers and esophageal stricture which was dilated. Barium swallowon 11/15/2015 was normal. She has a history of mesenteric ischemia.  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 She was on Coumadin, and now on Plavix. Colonoscopyin 2008 revealed diverticulosis.   EGDon 03/28/2017 revealed a moderate-sized area of extrinsic compression was found in the mid esophagus from about 25-28 cm, no apparent defect or abnormality in the mucosa or esophageal wall. There was a 2 cm cratered esophageal ulcer. There was segmental moderate mucosal changes characterized by granularity at the gastroesophageal junction. There was localized mild inflammation characterized by congestion (edema), depression and erythema was found on the greater curvature of the gastric body. Biopsies revealed no H pylori, dysplasia or malignancy. She is on pantoprazole and Carafate. She has esophageal spasmsimproved with Bentyl.  EGDon 06/20/2017 revealed a non-bleeding esophageal ulcer, gastritis, and a normal duodenum.  Pathology revealed mild reactive gastropathy. Colonoscopyon 06/20/2017 revealed non-bleeding external hemorrhoids, diverticulosis in the sigmoid colon, and one 1 mm polyp at the recto-sigmoid colon. Pathology revealed a tubular adenoma Negative for high grade dysplasia or malignancy.  Upper EUSon 12/05/2017 revealed esophageal stenosis. Biopsy revealed CMV viral cytopathic effect.There was no evidence of malignancy. The EUS scope could not be advanced into the stenosis as it was too tight. There was loss of normal wall layers and diffusely hypoechoic measuring from 3 mm to 6 mm in thickness.   She was treated with valacyclovir x 28 days. CMV DNA quantitative by PCR was positive on 01/02/2018 and <200 IU/ml on 01/30/2018.  Upper GI endoscopyon 04/15/2018 revealed extrinsic compression in the mid esophagus. Esophageal biopsy revealed detached stratified squamous epithelium with focal reactive parakeratosis. There was no dysplasia or malignancy. There was a benign appearing esophageal stenosis. She underwentEGD with esophageal dilatationon 05/01/2018 and 05/19/2018 at [facility].  Chest CT on 05/28/2018 revealed distal esophageal stricture at the GE junction with associated mild submucosal edema, possibly reflecting a post infectious/inflammatory stricture, underlying neoplasm not entirely excluded. There was no extrinsic compression.  She underwent PEG tube placementon 09/11/2019and exchange on 03/25/2019. She takes little by mouth.  Symptomatically, she has felt "good." She still has occasional nosebleeds. She takes a small dose of imodium everyday.  She tries to eat a little by mouth; she relies on her feeding tube.  She denies any melena or hematochezia.  She is able to perform ADLs independently.   Plan: 1.   Labs today: CBC with diff, ferritin, iron studies. 2. Iron deficiency anemia Hematocrit 29.0. Hemoglobin 9.5. MCV89.8 on  04/01/2020.  Ferritin51.  Iron saturation 11% (low) on 04/07/2020.   Venofer x 1.  Hematocrit 33.2.  Hemoglobin 10.8.  MCV 92.0 on 06/01/2020.   Ferritin 59 with an iron saturation of 13% (low).   Venofer x 1.  Hematocrit 33.1.  Hemoglobin 10.6.  MCV 91.2 on 10/04/2020   Ferritin 68 with an iron saturation of 9% (low) and a TIBC of 342 (available after clinic).  Patient received a trial dose of Venofer x1 on 04/07/2020 secondary to iron saturation < 18%.             Discuss plan for additional IV iron if iron saturation is low.  Suspect some component of anemia of chronic disease.             Continue to monitor. 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 Patient'sprimary nutrition is through her G-tube.  Continue to monitor. 4.   RN:  Call patient with iron studies and if IV iron needed. 5.   RTC in 3 months for labs (CBC, ferritin). 6.  RTC in 6 months for MD assessment, labs (CBC with diff, ferritin, iron studies, sed rate- day before) and +/- Venofer.  I discussed the assessment and treatment plan with the patient.  The patient was provided an opportunity to ask questions and all were answered.  The patient agreed with the plan and demonstrated an understanding of the instructions.  The patient was advised to call back if the symptoms worsen or if the condition fails to improve as anticipated.  I provided 16 minutes of face-to-face time during this this encounter and > 50% was spent counseling as documented under my assessment and plan.  An additional 5 minutes were spent reviewing her chart (Epic and Care Everywhere) including notes, labs, and imaging studies.    Lequita Asal, MD, PhD    10/04/2020, 12:02 PM  I, Mirian Mo Tufford, am acting as a Education administrator for [employer]. Mike Gip, MD.   I, Kiari Hosmer C. Mike Gip, MD, have reviewed the above documentation for accuracy and  completeness, and I agree with the above.

## 2020-10-04 ENCOUNTER — Other Ambulatory Visit: Payer: Self-pay

## 2020-10-04 ENCOUNTER — Encounter: Payer: Self-pay | Admitting: Hematology and Oncology

## 2020-10-04 ENCOUNTER — Inpatient Hospital Stay: Payer: Medicare Other | Admitting: Hematology and Oncology

## 2020-10-04 ENCOUNTER — Inpatient Hospital Stay: Payer: Medicare Other

## 2020-10-04 ENCOUNTER — Inpatient Hospital Stay: Payer: Medicare Other | Attending: Hematology and Oncology

## 2020-10-04 VITALS — BP 147/46 | HR 50 | Temp 97.0°F | Resp 16 | Wt 113.8 lb

## 2020-10-04 DIAGNOSIS — K208 Other esophagitis without bleeding: Secondary | ICD-10-CM | POA: Diagnosis not present

## 2020-10-04 DIAGNOSIS — B259 Cytomegaloviral disease, unspecified: Secondary | ICD-10-CM

## 2020-10-04 DIAGNOSIS — B258 Other cytomegaloviral diseases: Secondary | ICD-10-CM | POA: Diagnosis not present

## 2020-10-04 DIAGNOSIS — D509 Iron deficiency anemia, unspecified: Secondary | ICD-10-CM | POA: Insufficient documentation

## 2020-10-04 LAB — CBC WITH DIFFERENTIAL/PLATELET
Abs Immature Granulocytes: 0.02 10*3/uL (ref 0.00–0.07)
Basophils Absolute: 0 10*3/uL (ref 0.0–0.1)
Basophils Relative: 1 %
Eosinophils Absolute: 0.5 10*3/uL (ref 0.0–0.5)
Eosinophils Relative: 8 %
HCT: 33.1 % — ABNORMAL LOW (ref 36.0–46.0)
Hemoglobin: 10.6 g/dL — ABNORMAL LOW (ref 12.0–15.0)
Immature Granulocytes: 0 %
Lymphocytes Relative: 16 %
Lymphs Abs: 1 10*3/uL (ref 0.7–4.0)
MCH: 29.2 pg (ref 26.0–34.0)
MCHC: 32 g/dL (ref 30.0–36.0)
MCV: 91.2 fL (ref 80.0–100.0)
Monocytes Absolute: 0.4 10*3/uL (ref 0.1–1.0)
Monocytes Relative: 7 %
Neutro Abs: 4.2 10*3/uL (ref 1.7–7.7)
Neutrophils Relative %: 68 %
Platelets: 269 10*3/uL (ref 150–400)
RBC: 3.63 MIL/uL — ABNORMAL LOW (ref 3.87–5.11)
RDW: 13.7 % (ref 11.5–15.5)
WBC: 6.1 10*3/uL (ref 4.0–10.5)
nRBC: 0 % (ref 0.0–0.2)

## 2020-10-04 LAB — FERRITIN: Ferritin: 68 ng/mL (ref 11–307)

## 2020-10-04 LAB — IRON AND TIBC
Iron: 29 ug/dL (ref 28–170)
Saturation Ratios: 9 % — ABNORMAL LOW (ref 10.4–31.8)
TIBC: 342 ug/dL (ref 250–450)
UIBC: 313 ug/dL

## 2020-10-10 ENCOUNTER — Telehealth: Payer: Self-pay

## 2020-10-17 ENCOUNTER — Inpatient Hospital Stay: Payer: Medicare Other

## 2020-10-18 ENCOUNTER — Emergency Department
Admission: EM | Admit: 2020-10-18 | Discharge: 2020-10-19 | Disposition: A | Discharge status: Home / Self Care | Payer: Medicare Other | Attending: Emergency Medicine | Admitting: Emergency Medicine

## 2020-10-18 ENCOUNTER — Other Ambulatory Visit: Payer: Self-pay

## 2020-10-18 DIAGNOSIS — Z85828 Personal history of other malignant neoplasm of skin: Secondary | ICD-10-CM | POA: Diagnosis not present

## 2020-10-18 DIAGNOSIS — Z931 Gastrostomy status: Secondary | ICD-10-CM

## 2020-10-18 DIAGNOSIS — T85598A Other mechanical complication of other gastrointestinal prosthetic devices, implants and grafts, initial encounter: Secondary | ICD-10-CM

## 2020-10-18 DIAGNOSIS — Z7901 Long term (current) use of anticoagulants: Secondary | ICD-10-CM | POA: Diagnosis not present

## 2020-10-18 DIAGNOSIS — Z85841 Personal history of malignant neoplasm of brain: Secondary | ICD-10-CM | POA: Diagnosis not present

## 2020-10-18 DIAGNOSIS — Z7984 Long term (current) use of oral hypoglycemic drugs: Secondary | ICD-10-CM | POA: Insufficient documentation

## 2020-10-18 DIAGNOSIS — Z90710 Acquired absence of both cervix and uterus: Secondary | ICD-10-CM | POA: Insufficient documentation

## 2020-10-18 DIAGNOSIS — Z79899 Other long term (current) drug therapy: Secondary | ICD-10-CM | POA: Insufficient documentation

## 2020-10-18 DIAGNOSIS — K9423 Gastrostomy malfunction: Secondary | ICD-10-CM | POA: Diagnosis present

## 2020-10-18 DIAGNOSIS — Z809 Family history of malignant neoplasm, unspecified: Secondary | ICD-10-CM | POA: Insufficient documentation

## 2020-10-18 DIAGNOSIS — Z20822 Contact with and (suspected) exposure to covid-19: Secondary | ICD-10-CM | POA: Insufficient documentation

## 2020-10-18 DIAGNOSIS — Z7902 Long term (current) use of antithrombotics/antiplatelets: Secondary | ICD-10-CM | POA: Diagnosis not present

## 2020-10-18 LAB — BASIC METABOLIC PANEL
Anion gap: 11 (ref 5–15)
BUN: 50 mg/dL — ABNORMAL HIGH (ref 8–23)
CO2: 26 mmol/L (ref 22–32)
Calcium: 8.7 mg/dL — ABNORMAL LOW (ref 8.9–10.3)
Chloride: 106 mmol/L (ref 98–111)
Creatinine, Ser: 1.06 mg/dL — ABNORMAL HIGH (ref 0.44–1.00)
GFR, Estimated: 50 mL/min — ABNORMAL LOW (ref >60)
Glucose, Bld: 187 mg/dL — ABNORMAL HIGH (ref 70–99)
Potassium: 4.4 mmol/L (ref 3.5–5.1)
Sodium: 143 mmol/L (ref 135–145)

## 2020-10-18 LAB — CBC
HCT: 32.4 % — ABNORMAL LOW (ref 36.0–46.0)
Hemoglobin: 10.3 g/dL — ABNORMAL LOW (ref 12.0–15.0)
MCH: 29.6 pg (ref 26.0–34.0)
MCHC: 31.8 g/dL (ref 30.0–36.0)
MCV: 93.1 fL (ref 80.0–100.0)
Platelets: 271 10*3/uL (ref 150–400)
RBC: 3.48 MIL/uL — ABNORMAL LOW (ref 3.87–5.11)
RDW: 14.1 % (ref 11.5–15.5)
WBC: 5.7 10*3/uL (ref 4.0–10.5)
nRBC: 0 % (ref 0.0–0.2)

## 2020-10-18 LAB — PROTIME-INR
INR: 1 (ref 0.8–1.2)
Prothrombin Time: 12.8 seconds (ref 11.4–15.2)

## 2020-10-18 LAB — APTT: aPTT: 30 seconds (ref 24–36)

## 2020-10-18 NOTE — ED Triage Notes (Addendum)
Pt states she is at ER because her feeding tube came out last night. Pt has tube with her. Pt has G tube. 2nd time it has happened. Site appears open. Pt has gauze over site because states it's "leaking some" A&O in wheelchair. Pt states she had her doctor try "all day" to put tube back in but states she'll need it surgical placed.

## 2020-10-18 NOTE — ED Notes (Signed)
Pt states she hasn't been able to take BP meds d/t G tube being out.

## 2020-10-19 ENCOUNTER — Ambulatory Visit
Admit: 2020-10-19 | Discharge: 2020-10-19 | Disposition: A | Discharge status: Home / Self Care | Payer: Medicare Other | Attending: Emergency Medicine | Admitting: Emergency Medicine

## 2020-10-19 HISTORY — PX: IR REPLACE G-TUBE SIMPLE WO FLUORO: IMG2323

## 2020-10-19 LAB — RESP PANEL BY RT-PCR (FLU A&B, COVID) ARPGX2
Influenza A by PCR: NEGATIVE
Influenza B by PCR: NEGATIVE
SARS Coronavirus 2 by RT PCR: NEGATIVE

## 2020-10-19 MED ORDER — LIDOCAINE VISCOUS HCL 2 % MT SOLN
OROMUCOSAL | Status: AC
  Filled 2020-10-19: qty 15

## 2020-10-19 MED ORDER — IODIXANOL 320 MG/ML IV SOLN
50.0000 mL | Freq: Once | INTRAVENOUS | Status: AC
  Administered 2020-10-19: 10 mL

## 2020-10-19 MED ORDER — LACTATED RINGERS IV BOLUS
1000.0000 mL | Freq: Once | INTRAVENOUS | Status: AC
  Administered 2020-10-19: 1000 mL via INTRAVENOUS

## 2020-10-19 NOTE — ED Notes (Signed)
Paige at bedside to get pt.

## 2020-10-19 NOTE — ED Provider Notes (Signed)
[facility] Emergency Department Provider Note  ____________________________________________  Time seen: Approximately 12:58 AM  I have reviewed the triage vital signs and the nursing notes.   HISTORY  Chief Complaint feeding tube   HPI [name] is a 87 y.o. female with several chronic medical problems as listed below including being G-tube dependent who presented for dislodged G-tube. This happened 48 hours ago. Patient attempted several times to replace it at home unsuccessfully. Was unable to see her doctor. Has not eaten or drink anything in the last 48 hours. No abdominal pain, no nausea or vomiting, no fever   Past Medical History:  Diagnosis Date  . Anemia    IRON INFUSIONS  . Aortic valve sclerosis   . Arrhythmia   . Arthritis    osteoarthritis  . Basal cell carcinoma of skin   . Brain tumor ([place])   . Brain tumor ([place])   . Cervical spine disease   . CHF (congestive heart failure) ([place])   . Diabetes mellitus without complication ([place])   . Dysrhythmia    sinus arrhythmia  . Esophageal stricture    severe, led to feeding tube placement in Sept 2019  . Esophageal ulcer without bleeding   . Gastrostomy tube dependent ([place])    DOES NOT EAT OR DRINK   . GERD (gastroesophageal reflux disease)   . History of kidney stones   . Hyperlipemia   . Hypertension   . Kidney stones   . Leaky heart valve   . Meningioma ([place])   . 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 Pulmonary hypertension ([place])   . RLS (restless legs syndrome)   . Vertigo     Patient Active Problem List   Diagnosis Date Noted  . Respiratory failure with hypoxia ([place]) 11/23/2019  . CHF exacerbation ([place]) 11/23/2019  . HFrEF (heart failure with reduced ejection fraction) ([place]) 11/23/2019  . GERD (gastroesophageal reflux disease)   . Gastrostomy tube dependent ([place])   . Esophageal ulcer without bleeding   . Diabetes mellitus without complication ([place])   . Anemia   .  Aortic valve disease 08/24/2019  . Gastrostomy tube in place ([place]) 07/31/2019  . RLS (restless legs syndrome) 07/31/2019  . Chest pain, atypical 03/18/2019  . Tricuspid regurgitation 02/13/2019  . Heart palpitations 02/12/2019  . Hyponatremia 06/23/2018  . Protein-calorie malnutrition, severe 06/12/2018  . Esophageal stricture 06/11/2018  . Weight loss 05/29/2018  . Dysphagia 05/29/2018  . Esophageal stenosis 05/05/2018  . Hyperlipemia 04/04/2018  . Itching 01/08/2018  . Medication monitoring encounter 01/08/2018  . Paronychia of toe 01/08/2018  . Esophagitis, CMV ([place]) 01/01/2018  . 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 Hypertension 04/30/2017  . Mesenteric ischemia ([place]) 03/01/2017  . Iron deficiency anemia 02/28/2017  . PAD (peripheral artery disease) ([place]) 02/05/2017  . Hyperlipidemia type II 04/04/2016  . Pulmonary hypertension ([place]) 01/04/2015  . Generalized OA 12/13/2014  . Leg edema 12/13/2014  . Aortic valve sclerosis 02/19/2014  . Diabetes mellitus ([place]) 12/25/2013  . Acute, but ill-defined, cerebrovascular disease 12/25/2013  . Benign essential hypertension 12/25/2013  . Unilateral primary osteoarthritis, unspecified knee 12/25/2013  . Personal history of other malignant neoplasm of skin 05/28/2012    Past Surgical History:  Procedure Laterality Date  . ABDOMINAL HYSTERECTOMY    . APPENDECTOMY    . ARTHROGRAM KNEE Left   . BACK SURGERY     CERVIVAL NECK FUSION  . CATARACT EXTRACTION W/PHACO Left 11/11/2018   Procedure: CATARACT  EXTRACTION PHACO AND INTRAOCULAR LENS PLACEMENT (IOC) LEFT, DIABETIC;  Surgeon: Birder Robson, MD;  Location: [facility] ORS;  Service: Ophthalmology;  Laterality: Left;  [place]  00:41 CDE 6.41 Fluid pack lot # [id number]  . COLONOSCOPY WITH PROPOFOL N/A 06/20/2017   Procedure: COLONOSCOPY WITH PROPOFOL;  Surgeon: Lollie Sails, MD;  Location: [facility] ENDOSCOPY;  Service: Endoscopy;  Laterality: N/A;  . ESOPHAGOGASTRODUODENOSCOPY  (EGD) WITH PROPOFOL N/A 03/14/2015   Procedure: ESOPHAGOGASTRODUODENOSCOPY (EGD) WITH PROPOFOL;  Surgeon: Josefine Class, MD;  Location: [facility] ENDOSCOPY;  Service: Endoscopy;  Laterality: N/A;  . ESOPHAGOGASTRODUODENOSCOPY (EGD) WITH PROPOFOL N/A 03/28/2017   Procedure: ESOPHAGOGASTRODUODENOSCOPY (EGD) WITH PROPOFOL;  Surgeon: Lollie Sails, MD;  Location: [facility] ENDOSCOPY;  Service: Endoscopy;  Laterality: N/A;  . ESOPHAGOGASTRODUODENOSCOPY (EGD) WITH PROPOFOL N/A 06/20/2017   Procedure: ESOPHAGOGASTRODUODENOSCOPY (EGD) WITH PROPOFOL;  Surgeon: Lollie Sails, MD;  Location: [facility] ENDOSCOPY;  Service: Endoscopy;  Laterality: N/A;  . ESOPHAGOGASTRODUODENOSCOPY (EGD) WITH PROPOFOL N/A 10/21/2017   Procedure: ESOPHAGOGASTRODUODENOSCOPY (EGD) WITH PROPOFOL;  Surgeon: Lollie Sails, MD;  Location: [facility] ENDOSCOPY;  Service: Endoscopy;  Laterality: N/A;  . ESOPHAGOGASTRODUODENOSCOPY (EGD) WITH PROPOFOL N/A 04/15/2018   Procedure: ESOPHAGOGASTRODUODENOSCOPY (EGD) WITH PROPOFOL;  Surgeon: Lollie Sails, MD;  Location: [facility] ENDOSCOPY;  Service: Endoscopy;  Laterality: N/A;  . ESOPHAGUS SURGERY     CLOSURE  . EYE SURGERY    . HYSTERECTOMY ABDOMINAL WITH SALPINGECTOMY    . IR GASTROSTOMY TUBE MOD SED  06/11/2018  . IR GASTROSTOMY TUBE REMOVAL  03/25/2019  . PERIPHERAL VASCULAR CATHETERIZATION N/A 01/23/2016   Procedure: Visceral Venography;  Surgeon: Algernon Huxley, MD;  Location: [place] [facility] CV LAB;  Service: Cardiovascular;  Laterality: N/A;  . PERIPHERAL VASCULAR CATHETERIZATION  01/23/2016   Procedure: Peripheral Vascular Intervention;  Surgeon: Algernon Huxley, MD;  Location: [place] CV LAB;  Service: Cardiovascular;;  . UPPER ESOPHAGEAL ENDOSCOPIC ULTRASOUND (EUS) N/A 12/05/2017   Procedure: UPPER ESOPHAGEAL ENDOSCOPIC ULTRASOUND (EUS);  Surgeon: Jola Schmidt, MD;  Location: [facility] ENDOSCOPY;  Service: Endoscopy;  Laterality: N/A;  . VISCERAL ANGIOGRAPHY N/A 03/18/2017   Procedure: Visceral  Angiography;  Surgeon: Algernon Huxley, MD;  Location: [facility] CV LAB;  Service: Cardiovascular;  Laterality: N/A;  . VISCERAL ARTERY INTERVENTION N/A 03/18/2017   Procedure: Visceral Artery Intervention;  Surgeon: Algernon Huxley, MD;  Location: [place] [facility] CV LAB;  Service: Cardiovascular;  Laterality: N/A;    Prior to Admission medications   Medication Sig Start Date End Date Taking? Authorizing Provider  acetaminophen (TYLENOL) 160 MG/5ML solution Place 20.3 mLs (650 mg total) into feeding tube every 6 (six) hours as needed for mild pain. 06/13/18   Demetrios Loll, MD  allopurinol (ZYLOPRIM) 100 MG tablet Place 100 mg into feeding tube daily.    [provider]  Amino Acids-Protein Hydrolys (FEEDING SUPPLEMENT, PRO-STAT SUGAR FREE 64,) LIQD Place 30 mLs into feeding tube daily. 06/13/18   Demetrios Loll, MD  carvedilol (COREG) 6.25 MG tablet Take 6.25 mg by mouth 2 (two) times daily with a meal.    [provider]  clopidogrel (PLAVIX) 75 MG tablet GIVE 1 TABLET VIA FEEDING TUBE EVERY [name] AS DIRECTED 09/14/20   Algernon Huxley, MD  dextromethorphan-guaiFENesin [facility] DM) 30-600 MG 12hr tablet Take 1 tablet by mouth 2 (two) times daily.    [provider]  diclofenac Sodium (VOLTAREN) 1 % GEL Apply topically 4 (four) times daily.    [provider]  diltiazem (CARDIZEM) 120 MG tablet Take 120 mg  by mouth daily.  08/16/19   [provider]  furosemide (LASIX) 10 MG/ML solution Place 60 mg into feeding tube daily.     [provider]  glipiZIDE (GLUCOTROL) 5 MG tablet Place 1 tablet (5 mg total) into feeding tube daily before breakfast. 06/13/18   Demetrios Loll, MD  hydrALAZINE (APRESOLINE) 50 MG tablet Place 1 tablet (50 mg total) into feeding tube 2 (two) times daily. 06/13/18   Demetrios Loll, MD  Hypromellose 0.2 % SOLN Place 1 drop into both eyes 3 (three) times daily as needed (dry eyes).     [provider]  lisinopril (ZESTRIL) 40 MG tablet Take  20 mg by mouth daily.     [provider]  loperamide (IMODIUM) 1 MG/5ML solution Take 3 mg by mouth as needed for diarrhea or loose stools.    [provider]  meclizine (ANTIVERT) 25 MG tablet Place 25 mg into feeding tube as needed for dizziness.     [provider]  Multiple Vitamin (MULTIVITAMIN) LIQD Place 15 mLs into feeding tube daily. 06/13/18   Demetrios Loll, MD  Nutritional Supplements (FEEDING SUPPLEMENT, JEVITY 1.5 CAL/FIBER,) LIQD Place 237 mLs into feeding tube 4 (four) times daily. 06/25/18   Dustin Flock, MD  pantoprazole sodium (PROTONIX) 40 mg/20 mL PACK Place 20 mLs (40 mg total) into feeding tube 2 (two) times daily. Patient not taking: No sig reported 06/25/18   Dustin Flock, MD  Pedialyte (PEDIALYTE) SOLN Take by mouth in the morning, at noon, in the evening, and at bedtime.    [provider]  rOPINIRole (REQUIP) 2 MG tablet Place 1 tablet (2 mg total) into feeding tube 2 (two) times daily. 06/13/18   Demetrios Loll, MD  spironolactone (ALDACTONE) 25 MG tablet Take 25 mg by mouth daily. 12/04/19   [provider]  vitamin C (VITAMIN C) 250 MG tablet Place 1 tablet (250 mg total) into feeding tube 2 (two) times daily. 06/13/18   Demetrios Loll, MD  Water For Irrigation, Sterile (FREE WATER) SOLN Place 30 mLs into feeding tube every 4 (four) hours. 06/25/18   Dustin Flock, MD    Allergies Elemental sulfur, Gabapentin, Lipitor [atorvastatin], Mevacor [lovastatin], Milk-related compounds, Septra [sulfamethoxazole-trimethoprim], Statins, and Zocor [simvastatin]  Family History  Problem Relation Age of Onset  . Stroke Mother   . Hypertension Mother   . Heart disease Mother   . Cancer Father   . Heart disease Father   . Heart attack Sister   . Diabetes Sister   . Heart attack Brother     Social History Social History   Tobacco Use  . Smoking status: Never Smoker  . Smokeless tobacco: Never Used  Vaping Use  . Vaping Use: Never  used  Substance Use Topics  . Alcohol use: Never  . Drug use: Never    Review of Systems  Constitutional: Negative for fever. Eyes: Negative for visual changes. ENT: Negative for sore throat. Neck: No neck pain  Cardiovascular: Negative for chest pain. Respiratory: Negative for shortness of breath. Gastrointestinal: Negative for abdominal pain, vomiting or diarrhea. Genitourinary: Negative for dysuria. Musculoskeletal: Negative for back pain. Skin: Negative for rash. Neurological: Negative for headaches, weakness or numbness. Psych: No SI or HI  ____________________________________________   PHYSICAL EXAM:  VITAL SIGNS: ED Triage Vitals  Enc Vitals Group     BP 10/18/20 1742 (!) 190/57     Pulse Rate 10/18/20 1742 68     Resp 10/18/20 1742 16  Temp 10/18/20 1740 98.3 F (36.8 C)     Temp Source 10/18/20 1740 Oral     SpO2 10/18/20 1742 96 %     Weight 10/18/20 1740 116 lb (52.6 kg)     Height 10/18/20 1740 5\' 3"  (1.6 m)     Head Circumference --      Peak Flow --      Pain Score 10/18/20 1740 0     Pain Loc --      Pain Edu? --      Excl. in [place]? --     Constitutional: Alert and oriented. Well appearing and in no apparent distress. HEENT:      Head: Normocephalic and atraumatic.         Eyes: Conjunctivae are normal. Sclera is non-icteric.       Mouth/Throat: Mucous membranes are moist.       Neck: Supple with no signs of meningismus. Cardiovascular: Regular rate and rhythm. No murmurs, gallops, or rubs.  Respiratory: Normal respiratory effort. Lungs are clear to auscultation bilaterally.  Gastrointestinal: Soft, non tender, and non distended with positive bowel sounds. No rebound or guarding. Musculoskeletal: No edema, cyanosis, or erythema of extremities. Neurologic: Normal speech and language. Face is symmetric. Moving all extremities. No gross focal neurologic deficits are appreciated. Skin: Skin is warm, dry and intact. No rash noted. Psychiatric: Mood  and affect are normal. Speech and behavior are normal.  ____________________________________________   LABS (all labs ordered are listed, but only abnormal results are displayed)  Labs Reviewed  CBC - Abnormal; Notable for the following components:      Result Value   RBC 3.48 (*)    Hemoglobin 10.3 (*)    HCT 32.4 (*)    All other components within normal limits  BASIC METABOLIC PANEL - Abnormal; Notable for the following components:   Glucose, Bld 187 (*)    BUN 50 (*)    Creatinine, Ser 1.06 (*)    Calcium 8.7 (*)    GFR, Estimated 50 (*)    All other components within normal limits  RESP PANEL BY RT-PCR (FLU A&B, COVID) ARPGX2  PROTIME-INR  APTT   ____________________________________________  EKG  none  ____________________________________________  RADIOLOGY  none  ____________________________________________   PROCEDURES  Procedure(s) performed: None Procedures Critical Care performed:  None ____________________________________________   INITIAL IMPRESSION / ASSESSMENT AND PLAN / ED COURSE  87 y.o. female with several chronic medical problems as listed below including being G-tube dependent who presented for dislodged G-tube x 48 hours. Attempt to replace tube with a 14Fr was unsuccessful.  Discussed with Dr.Schik from IR who says he will be able to do it in the suite in the morning.  We will keep patient n.p.o.  We will get a COVID swab. Patient was given IVF bolus since she has been NPO for 48hrs. Will hold off maintenance fluids since she has a history of CHF.  Old medical records reviewed including most recent echo from February 2021.  Plan discussed with patient and her daughter who is at bedside.       _____________________________________________ Please note:  Patient was evaluated in Emergency Department today for the symptoms described in the history of present illness. Patient was evaluated in the context of the global COVID-19 pandemic, which  necessitated consideration that the patient might be at risk for infection with the SARS-CoV-2 virus that causes COVID-19. Institutional protocols and algorithms that pertain to the evaluation of patients at risk for COVID-19 are  in a state of rapid change based on information released by regulatory bodies including the CDC and federal and state organizations. These policies and algorithms were followed during the patient's care in the ED.  Some ED evaluations and interventions may be delayed as a result of limited staffing during the pandemic.   [place] Controlled Substance Database was reviewed by me. ____________________________________________   FINAL CLINICAL IMPRESSION(S) / ED DIAGNOSES   Final diagnoses:  Feeding by G-tube ([place])  Feeding tube dysfunction, initial encounter      NEW MEDICATIONS STARTED DURING THIS VISIT:  ED Discharge Orders    None       Note:  This document was prepared using Dragon voice recognition software and may include unintentional dictation errors.    Rudene Re, MD 10/19/20 [phone]

## 2020-10-19 NOTE — ED Notes (Signed)
Pt sleeping at this time. Will check vitals once pt awakens.

## 2020-10-19 NOTE — Procedures (Signed)
Interventional Radiology Procedure Note  Procedure: G tube re-insertin  Indication: Dislodged G tube. Esophageal stricture.  Complications: None  EBL: < 10 mL  Miachel Roux, MD [phone]

## 2020-10-19 NOTE — ED Provider Notes (Signed)
-----------------------------------------   10:30 AM on 10/19/2020 -----------------------------------------  Patient has returned from interventional radiology where she had her G-tube replaced. Patient is awake and alert, no issues. Patient will be discharged home.   Harvest Dark, MD 10/19/20 1030

## 2020-10-20 ENCOUNTER — Inpatient Hospital Stay: Payer: Medicare Other

## 2020-10-20 ENCOUNTER — Other Ambulatory Visit: Payer: Self-pay

## 2020-10-20 VITALS — BP 155/67 | HR 64 | Resp 18

## 2020-10-20 DIAGNOSIS — D509 Iron deficiency anemia, unspecified: Secondary | ICD-10-CM

## 2020-10-20 DIAGNOSIS — D508 Other iron deficiency anemias: Secondary | ICD-10-CM

## 2020-10-20 MED ORDER — SODIUM CHLORIDE 0.9 % IV SOLN
INTRAVENOUS | Status: DC
  Filled 2020-10-20: qty 250

## 2020-10-20 MED ORDER — IRON SUCROSE 20 MG/ML IV SOLN
200.0000 mg | Freq: Once | INTRAVENOUS | Status: AC
  Administered 2020-10-20: 200 mg via INTRAVENOUS
  Filled 2020-10-20: qty 10

## 2020-10-20 NOTE — Progress Notes (Signed)
Pt received prescribed treatment in clinic, pt stable at d/c. 

## 2020-12-17 ENCOUNTER — Other Ambulatory Visit: Payer: Self-pay

## 2020-12-17 ENCOUNTER — Emergency Department: Payer: Medicare Other

## 2020-12-17 ENCOUNTER — Inpatient Hospital Stay
Admission: EM | Admit: 2020-12-17 | Discharge: 2020-12-22 | DRG: 064 | Disposition: A | Discharge status: Rehabilitation Facility | Payer: Medicare Other | Attending: Internal Medicine | Admitting: Internal Medicine

## 2020-12-17 DIAGNOSIS — R531 Weakness: Secondary | ICD-10-CM

## 2020-12-17 DIAGNOSIS — I5023 Acute on chronic systolic (congestive) heart failure: Secondary | ICD-10-CM | POA: Diagnosis present

## 2020-12-17 DIAGNOSIS — G2581 Restless legs syndrome: Secondary | ICD-10-CM | POA: Diagnosis present

## 2020-12-17 DIAGNOSIS — I63512 Cerebral infarction due to unspecified occlusion or stenosis of left middle cerebral artery: Secondary | ICD-10-CM | POA: Diagnosis present

## 2020-12-17 DIAGNOSIS — N390 Urinary tract infection, site not specified: Secondary | ICD-10-CM | POA: Diagnosis present

## 2020-12-17 DIAGNOSIS — K5900 Constipation, unspecified: Secondary | ICD-10-CM | POA: Diagnosis present

## 2020-12-17 DIAGNOSIS — I130 Hypertensive heart and chronic kidney disease with heart failure and stage 1 through stage 4 chronic kidney disease, or unspecified chronic kidney disease: Secondary | ICD-10-CM | POA: Diagnosis present

## 2020-12-17 DIAGNOSIS — Z931 Gastrostomy status: Secondary | ICD-10-CM

## 2020-12-17 DIAGNOSIS — R4701 Aphasia: Secondary | ICD-10-CM | POA: Diagnosis present

## 2020-12-17 DIAGNOSIS — I083 Combined rheumatic disorders of mitral, aortic and tricuspid valves: Secondary | ICD-10-CM | POA: Diagnosis present

## 2020-12-17 DIAGNOSIS — Z981 Arthrodesis status: Secondary | ICD-10-CM

## 2020-12-17 DIAGNOSIS — D329 Benign neoplasm of meninges, unspecified: Secondary | ICD-10-CM | POA: Diagnosis present

## 2020-12-17 DIAGNOSIS — G8191 Hemiplegia, unspecified affecting right dominant side: Secondary | ICD-10-CM | POA: Diagnosis present

## 2020-12-17 DIAGNOSIS — E7801 Familial hypercholesterolemia: Secondary | ICD-10-CM

## 2020-12-17 DIAGNOSIS — Z85828 Personal history of other malignant neoplasm of skin: Secondary | ICD-10-CM

## 2020-12-17 DIAGNOSIS — K222 Esophageal obstruction: Secondary | ICD-10-CM | POA: Diagnosis present

## 2020-12-17 DIAGNOSIS — Z20822 Contact with and (suspected) exposure to covid-19: Secondary | ICD-10-CM | POA: Diagnosis present

## 2020-12-17 DIAGNOSIS — E1169 Type 2 diabetes mellitus with other specified complication: Secondary | ICD-10-CM

## 2020-12-17 DIAGNOSIS — Z7984 Long term (current) use of oral hypoglycemic drugs: Secondary | ICD-10-CM

## 2020-12-17 DIAGNOSIS — E8809 Other disorders of plasma-protein metabolism, not elsewhere classified: Secondary | ICD-10-CM | POA: Diagnosis not present

## 2020-12-17 DIAGNOSIS — Z87442 Personal history of urinary calculi: Secondary | ICD-10-CM

## 2020-12-17 DIAGNOSIS — R29707 NIHSS score 7: Secondary | ICD-10-CM | POA: Diagnosis present

## 2020-12-17 DIAGNOSIS — K909 Intestinal malabsorption, unspecified: Secondary | ICD-10-CM | POA: Diagnosis not present

## 2020-12-17 DIAGNOSIS — R197 Diarrhea, unspecified: Secondary | ICD-10-CM | POA: Diagnosis not present

## 2020-12-17 DIAGNOSIS — Z8719 Personal history of other diseases of the digestive system: Secondary | ICD-10-CM

## 2020-12-17 DIAGNOSIS — R4182 Altered mental status, unspecified: Secondary | ICD-10-CM | POA: Diagnosis present

## 2020-12-17 DIAGNOSIS — Z8673 Personal history of transient ischemic attack (TIA), and cerebral infarction without residual deficits: Secondary | ICD-10-CM

## 2020-12-17 DIAGNOSIS — Z90710 Acquired absence of both cervix and uterus: Secondary | ICD-10-CM

## 2020-12-17 DIAGNOSIS — E785 Hyperlipidemia, unspecified: Secondary | ICD-10-CM | POA: Diagnosis present

## 2020-12-17 DIAGNOSIS — Z882 Allergy status to sulfonamides status: Secondary | ICD-10-CM

## 2020-12-17 DIAGNOSIS — Z7902 Long term (current) use of antithrombotics/antiplatelets: Secondary | ICD-10-CM

## 2020-12-17 DIAGNOSIS — Z681 Body mass index (BMI) 19 or less, adult: Secondary | ICD-10-CM | POA: Diagnosis not present

## 2020-12-17 DIAGNOSIS — I959 Hypotension, unspecified: Secondary | ICD-10-CM | POA: Diagnosis present

## 2020-12-17 DIAGNOSIS — Z823 Family history of stroke: Secondary | ICD-10-CM

## 2020-12-17 DIAGNOSIS — E1165 Type 2 diabetes mellitus with hyperglycemia: Secondary | ICD-10-CM | POA: Diagnosis not present

## 2020-12-17 DIAGNOSIS — E1122 Type 2 diabetes mellitus with diabetic chronic kidney disease: Secondary | ICD-10-CM | POA: Diagnosis present

## 2020-12-17 DIAGNOSIS — J9600 Acute respiratory failure, unspecified whether with hypoxia or hypercapnia: Secondary | ICD-10-CM | POA: Diagnosis present

## 2020-12-17 DIAGNOSIS — I272 Pulmonary hypertension, unspecified: Secondary | ICD-10-CM | POA: Diagnosis present

## 2020-12-17 DIAGNOSIS — E440 Moderate protein-calorie malnutrition: Secondary | ICD-10-CM | POA: Diagnosis present

## 2020-12-17 DIAGNOSIS — Z8249 Family history of ischemic heart disease and other diseases of the circulatory system: Secondary | ICD-10-CM

## 2020-12-17 DIAGNOSIS — E46 Unspecified protein-calorie malnutrition: Secondary | ICD-10-CM | POA: Diagnosis not present

## 2020-12-17 DIAGNOSIS — R1030 Lower abdominal pain, unspecified: Secondary | ICD-10-CM | POA: Diagnosis not present

## 2020-12-17 DIAGNOSIS — R079 Chest pain, unspecified: Secondary | ICD-10-CM | POA: Diagnosis not present

## 2020-12-17 DIAGNOSIS — Z888 Allergy status to other drugs, medicaments and biological substances status: Secondary | ICD-10-CM

## 2020-12-17 DIAGNOSIS — R471 Dysarthria and anarthria: Secondary | ICD-10-CM | POA: Diagnosis present

## 2020-12-17 DIAGNOSIS — N1831 Chronic kidney disease, stage 3a: Secondary | ICD-10-CM | POA: Diagnosis present

## 2020-12-17 DIAGNOSIS — R519 Headache, unspecified: Secondary | ICD-10-CM | POA: Diagnosis not present

## 2020-12-17 DIAGNOSIS — Z79899 Other long term (current) drug therapy: Secondary | ICD-10-CM

## 2020-12-17 DIAGNOSIS — Z86011 Personal history of benign neoplasm of the brain: Secondary | ICD-10-CM

## 2020-12-17 DIAGNOSIS — R269 Unspecified abnormalities of gait and mobility: Secondary | ICD-10-CM | POA: Diagnosis present

## 2020-12-17 DIAGNOSIS — D638 Anemia in other chronic diseases classified elsewhere: Secondary | ICD-10-CM | POA: Diagnosis not present

## 2020-12-17 DIAGNOSIS — J9601 Acute respiratory failure with hypoxia: Secondary | ICD-10-CM | POA: Diagnosis present

## 2020-12-17 DIAGNOSIS — I10 Essential (primary) hypertension: Secondary | ICD-10-CM | POA: Diagnosis not present

## 2020-12-17 DIAGNOSIS — Z833 Family history of diabetes mellitus: Secondary | ICD-10-CM

## 2020-12-17 LAB — APTT: aPTT: 30 seconds (ref 24–36)

## 2020-12-17 LAB — CBC WITH DIFFERENTIAL/PLATELET
Abs Immature Granulocytes: 0.03 10*3/uL (ref 0.00–0.07)
Basophils Absolute: 0.1 10*3/uL (ref 0.0–0.1)
Basophils Relative: 1 %
Eosinophils Absolute: 0.4 10*3/uL (ref 0.0–0.5)
Eosinophils Relative: 4 %
HCT: 32.5 % — ABNORMAL LOW (ref 36.0–46.0)
Hemoglobin: 10.3 g/dL — ABNORMAL LOW (ref 12.0–15.0)
Immature Granulocytes: 0 %
Lymphocytes Relative: 10 %
Lymphs Abs: 0.9 10*3/uL (ref 0.7–4.0)
MCH: 28.9 pg (ref 26.0–34.0)
MCHC: 31.7 g/dL (ref 30.0–36.0)
MCV: 91.3 fL (ref 80.0–100.0)
Monocytes Absolute: 0.5 10*3/uL (ref 0.1–1.0)
Monocytes Relative: 6 %
Neutro Abs: 7.1 10*3/uL (ref 1.7–7.7)
Neutrophils Relative %: 79 %
Platelets: 325 10*3/uL (ref 150–400)
RBC: 3.56 MIL/uL — ABNORMAL LOW (ref 3.87–5.11)
RDW: 14.6 % (ref 11.5–15.5)
WBC: 9 10*3/uL (ref 4.0–10.5)
nRBC: 0 % (ref 0.0–0.2)

## 2020-12-17 LAB — COMPREHENSIVE METABOLIC PANEL
ALT: 15 U/L (ref 0–44)
AST: 19 U/L (ref 15–41)
Albumin: 3.8 g/dL (ref 3.5–5.0)
Alkaline Phosphatase: 56 U/L (ref 38–126)
Anion gap: 7 (ref 5–15)
BUN: 43 mg/dL — ABNORMAL HIGH (ref 8–23)
CO2: 25 mmol/L (ref 22–32)
Calcium: 8.8 mg/dL — ABNORMAL LOW (ref 8.9–10.3)
Chloride: 105 mmol/L (ref 98–111)
Creatinine, Ser: 0.88 mg/dL (ref 0.44–1.00)
GFR, Estimated: >60 mL/min (ref >60)
Glucose, Bld: 188 mg/dL — ABNORMAL HIGH (ref 70–99)
Potassium: 4.4 mmol/L (ref 3.5–5.1)
Sodium: 137 mmol/L (ref 135–145)
Total Bilirubin: 1 mg/dL (ref 0.3–1.2)
Total Protein: 6.9 g/dL (ref 6.5–8.1)

## 2020-12-17 LAB — URINALYSIS, COMPLETE (UACMP) WITH MICROSCOPIC
Bacteria, UA: NONE SEEN
Bilirubin Urine: NEGATIVE
Glucose, UA: NEGATIVE mg/dL
Ketones, ur: NEGATIVE mg/dL
Leukocytes,Ua: NEGATIVE
Nitrite: NEGATIVE
Protein, ur: 30 mg/dL — AB
Specific Gravity, Urine: 1.013 (ref 1.005–1.030)
Squamous Epithelial / HPF: NONE SEEN (ref 0–5)
pH: 7 (ref 5.0–8.0)

## 2020-12-17 LAB — AMMONIA: Ammonia: 19 umol/L (ref 9–35)

## 2020-12-17 LAB — CBC
HCT: 32.9 % — ABNORMAL LOW (ref 36.0–46.0)
Hemoglobin: 10.3 g/dL — ABNORMAL LOW (ref 12.0–15.0)
MCH: 28.5 pg (ref 26.0–34.0)
MCHC: 31.3 g/dL (ref 30.0–36.0)
MCV: 91.1 fL (ref 80.0–100.0)
Platelets: 329 10*3/uL (ref 150–400)
RBC: 3.61 MIL/uL — ABNORMAL LOW (ref 3.87–5.11)
RDW: 14.6 % (ref 11.5–15.5)
WBC: 8.8 10*3/uL (ref 4.0–10.5)
nRBC: 0 % (ref 0.0–0.2)

## 2020-12-17 LAB — BLOOD GAS, VENOUS
Acid-Base Excess: 1.6 mmol/L (ref 0.0–2.0)
Bicarbonate: 28.1 mmol/L — ABNORMAL HIGH (ref 20.0–28.0)
O2 Saturation: 75.3 %
Patient temperature: 37
pCO2, Ven: 52 mmHg (ref 44.0–60.0)
pH, Ven: 7.34 (ref 7.250–7.430)
pO2, Ven: 43 mmHg (ref 32.0–45.0)

## 2020-12-17 LAB — RESP PANEL BY RT-PCR (FLU A&B, COVID) ARPGX2
Influenza A by PCR: NEGATIVE
Influenza B by PCR: NEGATIVE
SARS Coronavirus 2 by RT PCR: NEGATIVE

## 2020-12-17 LAB — PROTIME-INR
INR: 1 (ref 0.8–1.2)
Prothrombin Time: 13 seconds (ref 11.4–15.2)

## 2020-12-17 LAB — GLUCOSE, CAPILLARY: Glucose-Capillary: 85 mg/dL (ref 70–99)

## 2020-12-17 LAB — BRAIN NATRIURETIC PEPTIDE: B Natriuretic Peptide: 982.2 pg/mL — ABNORMAL HIGH (ref 0.0–100.0)

## 2020-12-17 LAB — TROPONIN I (HIGH SENSITIVITY)
Troponin I (High Sensitivity): 16 ng/L (ref <18)
Troponin I (High Sensitivity): 92 ng/L — ABNORMAL HIGH (ref <18)

## 2020-12-17 LAB — CBG MONITORING, ED: Glucose-Capillary: 178 mg/dL — ABNORMAL HIGH (ref 70–99)

## 2020-12-17 MED ORDER — SODIUM CHLORIDE 0.9% FLUSH: 3.0000 mL | Freq: Once | INTRAVENOUS | Status: DC

## 2020-12-17 MED ORDER — FUROSEMIDE 10 MG/ML IJ SOLN
60.0000 mg | Freq: Once | INTRAMUSCULAR | Status: AC
  Administered 2020-12-17: 60 mg via INTRAVENOUS
  Filled 2020-12-17: qty 8

## 2020-12-17 MED ORDER — IOHEXOL 350 MG/ML SOLN
100.0000 mL | Freq: Once | INTRAVENOUS | Status: AC | PRN
  Administered 2020-12-17: 100 mL via INTRAVENOUS

## 2020-12-17 MED ORDER — NOREPINEPHRINE 4 MG/250ML-% IV SOLN
0.0000 ug/min | INTRAVENOUS | Status: DC
  Administered 2020-12-17: 2 ug/min via INTRAVENOUS
  Filled 2020-12-17: qty 250

## 2020-12-17 MED ORDER — DOCUSATE SODIUM 100 MG PO CAPS: 100.0000 mg | ORAL_CAPSULE | Freq: Two times a day (BID) | ORAL | Status: DC | PRN

## 2020-12-17 MED ORDER — POLYETHYLENE GLYCOL 3350 17 G PO PACK
17.0000 g | PACK | Freq: Every day | ORAL | Status: DC | PRN
Start: 2020-12-17 — End: 2020-12-19

## 2020-12-17 MED ORDER — NITROGLYCERIN IN D5W 200-5 MCG/ML-% IV SOLN
0.0000 ug/min | INTRAVENOUS | Status: DC
  Administered 2020-12-17: 50 ug/min via INTRAVENOUS

## 2020-12-17 MED ORDER — NITROGLYCERIN IN D5W 200-5 MCG/ML-% IV SOLN
5.0000 ug/min | INTRAVENOUS | Status: DC
  Filled 2020-12-17: qty 250

## 2020-12-17 MED ORDER — PANTOPRAZOLE SODIUM 40 MG IV SOLR
40.0000 mg | INTRAVENOUS | Status: DC
  Administered 2020-12-17 – 2020-12-22 (×5): 40 mg via INTRAVENOUS
  Filled 2020-12-17 (×5): qty 40

## 2020-12-17 MED ORDER — NOREPINEPHRINE 4 MG/250ML-% IV SOLN
INTRAVENOUS | Status: AC
  Filled 2020-12-17: qty 250

## 2020-12-17 MED ORDER — INSULIN ASPART 100 UNIT/ML ~~LOC~~ SOLN
0.0000 [IU] | SUBCUTANEOUS | Status: DC
  Administered 2020-12-17: 2 [IU] via SUBCUTANEOUS
  Administered 2020-12-18: 1 [IU] via SUBCUTANEOUS
  Administered 2020-12-19: 3 [IU] via SUBCUTANEOUS
  Filled 2020-12-17 (×3): qty 1

## 2020-12-17 MED ORDER — NOREPINEPHRINE 4 MG/250ML-% IV SOLN
0.0000 ug/min | INTRAVENOUS | Status: DC
  Administered 2020-12-17: 10 ug/min via INTRAVENOUS

## 2020-12-17 MED ORDER — SODIUM CHLORIDE 0.9 % IV SOLN
250.0000 mL | INTRAVENOUS | Status: DC
  Administered 2020-12-17: 10 mL via INTRAVENOUS
  Administered 2020-12-21: 18:00:00 250 mL via INTRAVENOUS

## 2020-12-17 NOTE — ED Notes (Signed)
EDP and neurologist at bedside.

## 2020-12-17 NOTE — ED Notes (Signed)
When pt returned from CT pt was moaning loudly. Hooked back up to cardiac monitor to find HR in 120's and oxygen on 4L [place] at 69%. Placed on NRB at 15LPM and pt began to slowly come up into 70's. Grabbed staff to assist this RN and called RT to place pt on bipap. Allowed daughter to come into room as well as earlier visitor. PA discussing pt with family members at this time. RT at bedside placing pt on bipap.

## 2020-12-17 NOTE — ED Notes (Signed)
BP dropped to 97/56. PA advised to stop NTG drip.

## 2020-12-17 NOTE — Progress Notes (Signed)
CODE STROKE- PHARMACY COMMUNICATION   Time CODE STROKE called/page received:1518  Time response to CODE STROKE was made (in person or via phone): 1525  Time Stroke Kit retrieved from [place] (only if needed): N/A, no tPA  Name of Provider/Nurse contacted: Dr. Monico Hoar 12/17/2020  4:08 PM

## 2020-12-17 NOTE — ED Triage Notes (Signed)
Pt lives alone at home, usually able to care for self and drive. Pt last seen by family yesterday afternoon and was behaving normally per family. Pt next seen by family 11 am this morning and family noticed pt was not herself, pt was confused and could not answer questions and was moving more slowly than normal.

## 2020-12-17 NOTE — ED Notes (Signed)
Called lab to obtain blood cultures

## 2020-12-17 NOTE — ED Notes (Signed)
Lab at bedside

## 2020-12-17 NOTE — ED Notes (Signed)
X-ray at bedside

## 2020-12-17 NOTE — ED Notes (Signed)
Patient's family bedside, patient resting comfortably. Bi pap in place, foley emptied of 1258ml urine.

## 2020-12-17 NOTE — ED Notes (Signed)
Teleneuro cart set up at bedside, talking with neuro RN. Family called son who was with pt yesterday and stating that [place] is 1pm 12/16/20.

## 2020-12-17 NOTE — Consult Note (Signed)
Neurology Consultation Reason for Consult: Code stroke for HCT findings Requesting Physician: Dr. Carrie Mew   CC: Difficulty speaking   History is obtained from: Family at bedside, EDP and chart review   HPI: [name] Day is a 87 y.o. female with a past medical history significant for hypertension, hyperlipidemia, type 2 diabetes, pulmonary hypertension, meningioma, cervical spine stenosis s/p C-spine fusion, congestive heart failure, arrhythmia, severe esophageal stricture s/p feeding tube, sinus arrhythmia not on anticoagulation.   Per family, she lives independently and manages all of her medications on her own, crushing her pills and administering them via feeding tube.  Family was visiting yesterday at about 1 PM and she reported to them that she did not feel like she was able to talk as easily as normal, but family did not appreciate significant issue with her speech.  However this morning she did not appear well, had significant difficulty talking, and had not been able to take her meds due to confusion.  While in the emergency department, she began to have difficulty with her breathing, with oxygen saturations dropping into the 80s.  Initially this improved with nasal cannula, but after head CT was completed she required BiPAP.  CT revealed hypodensity in the left MCA territory at which time neurology was consulted with a code stroke activation.  On chart review she recently had her G-tube replaced on 10/19/2020 after it had dislodged spontaneously  LKW: Unclear, sometime prior to 1 PM on 3/18 tPA given?: No due to out of the window Premorbid modified rankin scale:      0 - No symptoms.  ROS: Unable to obtain due to aphasia, but family report no recent health concerns.   Past Medical History:  Diagnosis Date  . Anemia    IRON INFUSIONS  . Aortic valve sclerosis   . Arrhythmia   . Arthritis    osteoarthritis  . Basal cell carcinoma of skin   . Brain tumor ([place])   .  Brain tumor ([place])   . Cervical spine disease   . CHF (congestive heart failure) ([place])   . Diabetes mellitus without complication ([place])   . Dysrhythmia    sinus arrhythmia  . Esophageal stricture    severe, led to feeding tube placement in Sept 2019  . Esophageal ulcer without bleeding   . Gastrostomy tube dependent ([place])    DOES NOT EAT OR DRINK   . GERD (gastroesophageal reflux disease)   . History of kidney stones   . Hyperlipemia   . Hypertension   . Kidney stones   . Leaky heart valve   . Meningioma ([place])   . 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 Pulmonary hypertension ([place])   . RLS (restless legs syndrome)   . Vertigo    Past Surgical History:  Procedure Laterality Date  . ABDOMINAL HYSTERECTOMY    . APPENDECTOMY    . ARTHROGRAM KNEE Left   . BACK SURGERY     CERVIVAL NECK FUSION  . CATARACT EXTRACTION W/PHACO Left 11/11/2018   Procedure: CATARACT EXTRACTION PHACO AND INTRAOCULAR LENS PLACEMENT (IOC) LEFT, DIABETIC;  Surgeon: Birder Robson, MD;  Location: [facility] ORS;  Service: Ophthalmology;  Laterality: Left;  [place]  00:41 CDE 6.41 Fluid pack lot # [id number]  . COLONOSCOPY WITH PROPOFOL N/A 06/20/2017   Procedure: COLONOSCOPY WITH PROPOFOL;  Surgeon: Lollie Sails, MD;  Location: [facility] ENDOSCOPY;  Service: Endoscopy;  Laterality: N/A;  . ESOPHAGOGASTRODUODENOSCOPY (EGD) WITH PROPOFOL N/A 03/14/2015   Procedure: ESOPHAGOGASTRODUODENOSCOPY (EGD)  WITH PROPOFOL;  Surgeon: Josefine Class, MD;  Location: [facility] ENDOSCOPY;  Service: Endoscopy;  Laterality: N/A;  . ESOPHAGOGASTRODUODENOSCOPY (EGD) WITH PROPOFOL N/A 03/28/2017   Procedure: ESOPHAGOGASTRODUODENOSCOPY (EGD) WITH PROPOFOL;  Surgeon: Lollie Sails, MD;  Location: [facility] ENDOSCOPY;  Service: Endoscopy;  Laterality: N/A;  . ESOPHAGOGASTRODUODENOSCOPY (EGD) WITH PROPOFOL N/A 06/20/2017   Procedure: ESOPHAGOGASTRODUODENOSCOPY (EGD) WITH PROPOFOL;  Surgeon: Lollie Sails, MD;  Location: [facility] ENDOSCOPY;   Service: Endoscopy;  Laterality: N/A;  . ESOPHAGOGASTRODUODENOSCOPY (EGD) WITH PROPOFOL N/A 10/21/2017   Procedure: ESOPHAGOGASTRODUODENOSCOPY (EGD) WITH PROPOFOL;  Surgeon: Lollie Sails, MD;  Location: [facility] ENDOSCOPY;  Service: Endoscopy;  Laterality: N/A;  . ESOPHAGOGASTRODUODENOSCOPY (EGD) WITH PROPOFOL N/A 04/15/2018   Procedure: ESOPHAGOGASTRODUODENOSCOPY (EGD) WITH PROPOFOL;  Surgeon: Lollie Sails, MD;  Location: [facility] ENDOSCOPY;  Service: Endoscopy;  Laterality: N/A;  . ESOPHAGUS SURGERY     CLOSURE  . EYE SURGERY    . HYSTERECTOMY ABDOMINAL WITH SALPINGECTOMY    . IR GASTROSTOMY TUBE MOD SED  06/11/2018  . IR GASTROSTOMY TUBE REMOVAL  03/25/2019  . IR REPLACE G-TUBE SIMPLE WO FLUORO  10/19/2020  . PERIPHERAL VASCULAR CATHETERIZATION N/A 01/23/2016   Procedure: Visceral Venography;  Surgeon: Algernon Huxley, MD;  Location: [place] CV LAB;  Service: Cardiovascular;  Laterality: N/A;  . PERIPHERAL VASCULAR CATHETERIZATION  01/23/2016   Procedure: Peripheral Vascular Intervention;  Surgeon: Algernon Huxley, MD;  Location: [place] CV LAB;  Service: Cardiovascular;;  . UPPER ESOPHAGEAL ENDOSCOPIC ULTRASOUND (EUS) N/A 12/05/2017   Procedure: UPPER ESOPHAGEAL ENDOSCOPIC ULTRASOUND (EUS);  Surgeon: Jola Schmidt, MD;  Location: [facility] ENDOSCOPY;  Service: Endoscopy;  Laterality: N/A;  . VISCERAL ANGIOGRAPHY N/A 03/18/2017   Procedure: Visceral Angiography;  Surgeon: Algernon Huxley, MD;  Location: [place] CV LAB;  Service: Cardiovascular;  Laterality: N/A;  . VISCERAL ARTERY INTERVENTION N/A 03/18/2017   Procedure: Visceral Artery Intervention;  Surgeon: Algernon Huxley, MD;  Location: [place] CV LAB;  Service: Cardiovascular;  Laterality: N/A;   Current Outpatient Medications  Medication Instructions  . acetaminophen (TYLENOL) 650 mg, Per Tube, Every 6 hours PRN  . allopurinol (ZYLOPRIM) 100 mg, Per Tube, Daily  . Amino Acids-Protein Hydrolys (FEEDING SUPPLEMENT, PRO-STAT SUGAR FREE  64,) LIQD 30 mLs, Per Tube, Daily  . ascorbic acid (VITAMIN C) 250 mg, Per Tube, 2 times daily  . carvedilol (COREG) 6.25 mg, Oral, 2 times daily with meals  . clopidogrel (PLAVIX) 75 MG tablet GIVE 1 TABLET VIA FEEDING TUBE EVERY DAY AS DIRECTED  . dextromethorphan-guaiFENesin (MUCINEX DM) 30-600 MG 12hr tablet 1 tablet, Oral, 2 times daily  . diclofenac Sodium (VOLTAREN) 1 % GEL Topical, 4 times daily  . diltiazem (CARDIZEM) 120 mg, Oral, Daily  . furosemide (LASIX) 60 mg, Per Tube, Daily  . glipiZIDE (GLUCOTROL) 5 mg, Per Tube, Daily before breakfast  . hydrALAZINE (APRESOLINE) 50 mg, Per Tube, 2 times daily  . Hypromellose 0.2 % SOLN 1 drop, Both Eyes, 3 times daily PRN  . lisinopril (ZESTRIL) 20 mg, Oral, Daily  . loperamide (IMODIUM) 3 mg, Oral, As needed  . meclizine (ANTIVERT) 25 mg, Per Tube, As needed  . Multiple Vitamin (MULTIVITAMIN) LIQD 15 mLs, Per Tube, Daily  . Nutritional Supplements (FEEDING SUPPLEMENT, JEVITY 1.5 CAL/FIBER,) LIQD 237 mLs, Per Tube, 4 times daily  . pantoprazole sodium (PROTONIX) 40 mg, Per Tube, 2 times daily  . Pedialyte (PEDIALYTE) SOLN Oral, 4 times daily  . rOPINIRole (REQUIP) 2 mg, Per Tube, 2 times daily  .  spironolactone (ALDACTONE) 25 mg, Oral, Daily  . Water For Irrigation, Sterile (FREE WATER) SOLN 30 mLs, Per Tube, Every 4 hours     Family History  Problem Relation Age of Onset  . Stroke Mother   . Hypertension Mother   . Heart disease Mother   . Cancer Father   . Heart disease Father   . Heart attack Sister   . Diabetes Sister   . Heart attack Brother      Social History:  reports that she has never smoked. She has never used smokeless tobacco. She reports that she does not drink alcohol and does not use drugs.   Exam: Current vital signs: BP (!) 185/94   Pulse (!) 106   Temp 98.2 F (36.8 C) (Oral)   Resp 20   Ht 5\' 7"  (1.702 m)   Wt 56.7 kg   SpO2 98%   BMI 19.58 kg/m  Vital signs in last 24 hours: Temp:  [98.2 F  (36.8 C)] 98.2 F (36.8 C) (03/19 1437) Pulse Rate:  [88-112] 106 (03/19 1510) Resp:  [18-32] 20 (03/19 1510) BP: (162-188)/(65-111) 185/94 (03/19 1510) SpO2:  [68 %-99 %] 98 % (03/19 1510) Weight:  [56.7 kg] 56.7 kg (03/19 1227)   Physical Exam  Constitutional: Appears thin, Psych: Affect appropriate to situation, cooperative within limits of her fatigue Eyes: No scleral injection HENT: BiPAP in place MSK: Mild osteoarthritic changes of the hands Cardiovascular: Normal rate and regular rhythm.  Respiratory: Comfortable on the BiPAP (initially working very hard to breathe even with BiPAP) GI: Soft.  No distension. There is no tenderness.  Skin: Warm dry and intact visible skin.  Neuro: Mental Status: Patient is awake, alert, able to follow simple commands, language testing is severely limited secondary to BiPAP Cranial Nerves: II: Visual Fields are full by orientation to stimuli in all fields. Pupils are equal and round III,IV, VI: EOMI V: Facial sensation is symmetric to eyelash brush VII: Facial movement is symmetric within limits of evaluation by BiPAP, upper face is certainly symmetric VIII: hearing is intact to voice XII: tongue is midline without atrophy or fasciculations.  Motor: Tone is normal. Bulk is normal.  In the bilateral upper extremities she is slightly slower to move the right upper extremity than left upper extremity but there is no drift on later examination (on initial evaluation there was some drift of the right upper extremity).  Bilateral lower extremities at least antigravity. Sensory: She is grossly equally reactive to noxious stimulation in all 4 extremities Deep Tendon Reflexes: 2+ and symmetric in the biceps and patellae.  Plantars: Toes are mute bilaterally Cerebellar: Finger-to-nose and toe to hand are intact bilaterally  NIHSS total 7 Score breakdown:  2 points for not answering month/age, 1 point for right upper extremity drift on initial  evaluation (0 on repeat examination), 2 points for severe aphasia based on limited communication, 2 points for dysarthria though this is limited by BiPAP.  Unable to assess facial droop reliably secondary to BiPAP   I have reviewed labs in epic and the results pertinent to this consultation are: Creatinine 0.8  Mild normocytic anemia (hemoglobin 10.3, improved from prior 8.5 in February 2021   BNP 982.2 Initial troponin XVI, now rising to 90   I have reviewed the images obtained: Head CT with hypodensity in the superior left MCA territory CTA with multifocal stenoses especially in the left MCA territory (which trifurcates) and a fair burden of atherosclerosis throughout CTA/CT perfusion rapid read as  14 mL core infarct and 28 mL of area at risk, likely slightly underestimated and slightly overestimated on my personal review when comparing dry head CT hypodensity to CT perfusion studies  Impression: This is an 87 year old woman with past medical history of as above.  Suspect that she had a left MCA territory stroke on 3/18 and perhaps was initially compensating with collaterals.  Suspect that compensatory hypertension then leading to pulmonary edema and her subsequent respiratory complications.  Unfortunately she is out of the window for any safe intervention at this time, unfortunately it is a bit difficult to be certain on the last known well but CTA/CT perfusion did not favor intervention given the substantial core which on my review appeared to be underestimated with area at risk overestimated.  Given the significant multifocal stenoses on her vessel imaging, would be cautious with blood pressure overnight and modify this slowly  Recommendations:  #Left MCA territory stroke, likely atheroembolic, cannot rule out cardioembolic at this time - Stroke labs TSH, HgbA1c, fasting lipid panel - MRI brain to be ordered once her respiratory status has been stabilized - Frequent neuro checks, every  hour for the next 8 hours then every 2 hours for 8 hours followed by every 4 hours subsequently - Echocardiogram - Prophylactic therapy-Antiplatelet med: Aspirin - dose 325mg  PO or 300mg  PR, followed by 81 mg daily - Consider Plavix 300 mg load with 75 mg daily for 21 - 90 day course  - Risk factor modification - Telemetry monitoring; 30 day event monitor on discharge if no arrythmias captured  - Blood pressure goal   - Permissive hypertension to 220/120 due to LVO overnight, plan to start normalizing blood pressures tomorrow - PT consult, OT consult, Speech consult - Stroke team to follow  #Hypoxic respiratory distress -Likely secondary to hypertension driven by her stroke -Appears to be stabilizing on BiPAP -Cautious diuresis per primary team, if neurological examination worsens would fluid bolus and attempt to lay flat if respiratory status allows  Lesleigh Noe MD-PhD [facility] [phone] [facility] coverage for [facility] is from 8 AM to 4 AM in-house and 4 PM to 8 PM by telephone/video. 8 PM to 8 AM emergent questions or overnight urgent questions should be addressed to Teleneurology On-call or [facility] neurohospitalist; contact information can be found on AMION  Total critical care time: 64 minutes   Critical care time was exclusive of separately billable procedures and treating other patients.   Critical care was necessary to treat or prevent imminent or life-threatening deterioration.   Critical care was time spent personally by me on the following activities: development of treatment plan with patient and/or surrogate as well as nursing, discussions with consultants/primary team, evaluation of patient's response to treatment, examination of patient, obtaining history from patient or surrogate, ordering and performing treatments and interventions, ordering and review of laboratory studies, ordering and review of radiographic studies, and re-evaluation of  patient's condition as needed, as documented above.

## 2020-12-17 NOTE — H&P (Signed)
NAME:  [name], MRN:  [id number], DOB:  09-01-1929, LOS: 0 ADMISSION DATE:  12/17/2020, CONSULTATION DATE:  12/17/2020 REFERRING MD:  Dr. Joni Fears, CHIEF COMPLAINT:  Weakness & AMS  History of Present Illness:  87 yo F presenting to the ED from home with her daughter with complaints of malaise, confusion & weakness.  According to the patient's daughter [name] patient was last seen in her normal state of health on 12/16/2020 around 1 PM when the patient's son-in-law was at her house assisting her with some outside work.  Per the patient's daughters she has been living at home alone caring for herself, ambulatory, crushing her own medications and self administering through her PEG tube.  [name]'s husband noted patient had some difficulty " finding the right words" but that it was not serious enough to alarm him.  On 12/17/2020 around [place] the patient's daughter arrived to take her to an appointment and noted her mother was not herself.  The patient told [name], " something is not quite right".  [name] also noted her mother to be more "lethargic/slow", confused and not answering questions appropriately.  ED course: Initial vitals showed the patient slightly hypertensive but otherwise WNL.  Normothermic at 98.2, RR 18, HR 88, BP 162/65, SPO2 99% on room air. Around 1400 the patient became hypoxic at 80% on room air and was transitioned to 4 L nasal cannula.  Patient proceeded to have increased work of breathing and was ultimately placed on BiPAP.  Due to concerns for fluid volume overload secondary to known CHF history a nitroglycerin drip was initiated and 60 mg of Lasix given.  Chest x-ray demonstrated pulmonary edema and a small left pleural effusion with cardiomegaly.  CT head demonstrated 14 mL core infarct within the left MCA vascular territory.  Nitroglycerin drip was ultimately discontinued due to subsequent hypotension and Levophed drip initiated.  Stat neurology consult completed,  initial NIHSS 7. patient is out of the window for TPA administration and no other intervention warranted at this time.   PCCM consulted for admission.  Pertinent  Medical History  pulmonary HTN Vertigo Meningioma Occlusive mesenteric ischemia CHG T2DM Sinus Arrhythmia Severe Esophageal Stricture s/p feeding tube placement HLD Cervical spine stenosis s/p C-spine fusion CKD HFrEF-NYHA class II (LVEF 20 to 25%, with severe MR, moderate to severe TR, mild AR) Iron deficiency anemia Significant Hospital Events: Including procedures, antibiotic start and stop dates in addition to other pertinent events   . 12/17/20 CODE STROKE called in ED, no TPA, admitted to SDU on BIPAP  Interim History / Subjective:  Patient resting on bed, alert and responsive.  NIHSS: 5 (patient initially scored 1 for RUE drift, which is no longer present & patient initially scored 2 for not answering both LOC questions-on my assessment she answered 1 question appropriately)  Patient does appear to have expressive and receptive aphasia, some of the assessment was challenging due to BiPAP & unsure if patient understood certain commands.  Objective   Blood pressure (!) 147/56, pulse 85, temperature 98.2 F (36.8 C), temperature source Oral, resp. rate 10, height 5\' 7"  (1.702 m), weight 56.7 kg, SpO2 100 %.        Intake/Output Summary (Last 24 hours) at 12/17/2020 1932 Last data filed at 12/17/2020 1758 Gross per 24 hour  Intake 44.15 ml  Output --  Net 44.15 ml   Filed Weights   12/17/20 1227  Weight: 56.7 kg    Examination: General: Adult female, critically ill,  lying in bed, NAD HEENT: MM pink/moist, anicteric, atraumatic, neck supple Neuro: A&O x 2-3, able to follow commands, PERRL 3+, MAE CV: s1s2 RRR, NSR on monitor, no r/m/g Pulm: Regular, non labored on BiPAP at 40%, breath sounds diminished throughout GI: soft, rounded, non tender, bs x 4 GU: foley in place with clear yellow urine Skin: no  rashes/lesions noted Extremities: warm/dry, pulses + 2 R/P, trace edema noted BLE  Labs/imaging personally reviewed   12/17/2020 CT head without contrast >> 4.9 x 3.3 x 5.4 cm acute subacute appearing cortical and subcortical left MCA territory infarct within the left frontoparietal and left temporal lobes as well as left insula no evidence of hemorrhagic conversion no significant mass-effect.  12/17/2020 CT angio head/neck/cerebral perfusion >> distal left M2 MCA branch occlusion, severe stenosis of the supraclinoid L ICA, 14 mL core infarct within the left MCA with 28 mL region of hyperperfused parenchyma within the L MCA, reported mismatch volume 14 mL. 60% stenosis of R ICA, 80% stenosis of L subclavian artery  I, [name], AGACNP-BC, personally viewed and interpreted this ECG. EKG Interpretation Date:  12/17/2020 EKG Time:  12:10 PM Rate:  89 Rhythm: NSR QRS Axis:  Normal Intervals: QT prolongation ST/T Wave abnormalities: ST and T wave abnormalities, possible inferior lateral ischemia Narrative Interpretation: NSR with ST and T wave abnormalities & QT prolongation  Na+/ K+: 137/ 4.4 BUN/Cr.: 43/ 0.88 Serum CO2/ AG: 25/7  Hgb: 10.3 Troponin: 16 ~92 BNP: 92.2 WBC/ TMAX: 8.8/afebrile  VBG: 7.34/52/43/28.1 CXR 12/17/2020 @17 :20: Improved aeration compared to CXR at 14:30.  Cardiomegaly, interstitial edema and small left pleural effusion.  Resolved Hospital Problem list     Assessment & Plan:  Acute ischemic stroke PMHx: HLD, T2 DM, meningioma Patient independent and ambulatory at baseline, LKW- 12/16/20 around 1 PM. head CT showed acute left MCA stroke, not eligible for TPA or surgical intervention. - ASA & plavix load per neuro recommendations, followed by daily dosing -Every hour neurochecks x 5 (till midnight), then every 2 hour x 8, and every 4 hour -Follow-up MRI brain  -Echocardiogram ordered -Permissive hypertension to 220/120, if needed plan to normalize  BP 12/18/2020 -PT/OT/SLP consults -Continuous cardiac monitoring -Follow-up hemoglobin A1c & lipid panel with a.m. labs -Neurology consulted, appreciate input  Acute Hypoxic Respiratory Failure secondary to flash pulmonary edema PMHx: HFrEF (LVEF 20 to 25%) Patient became hypoxic with SPO2 of 80% on room air, does not wear chronic oxygen at home.  Chest x-ray showed pulmonary edema, 60 mg of Lasix given with nitroglycerin drip and patient had some transitory hypotension.  Repeat chest x-ray improved aeration noted - Continue BIPAP overnight, wean FiO2 as tolerated - Supplemental O2 to maintain SpO2 > 90% - Intermittent chest x-ray & ABG PRN - Ensure adequate pulmonary hygiene  - bronchodilators PRN -Careful diuresis as tolerated  Hypertension Patient initially required nitroglycerin drip for hypertension and flash pulmonary edema.  Patient then had transitory hypotension requiring Levophed drip which was eventually weaned off. -Permissive hypertension as noted above -Consider restarting home regimen as needed on 12/18/2020  Hyperlipidemia Patient not currently on statin therapy -Follow-up lipid panel in a.m. -Initiate statin once patient is stabilized  Type 2 diabetes mellitus -Every 4 blood glucose monitoring with SSI coverage -Follow ICU hypo-/hyper-glycemia protocol -Follow-up hemoglobin A1c with a.m. labs  Best practice (evaluated daily)  Diet:  NPO Pain/Anxiety/Delirium protocol (if indicated): No VAP protocol (if indicated): Not indicated DVT prophylaxis: SCD GI prophylaxis: PPI Glucose control:  SSI Yes  Central venous access:  N/A Arterial line:  N/A Foley:  Yes, and it is still needed Mobility:  bed rest  PT consulted: Yes Last date of multidisciplinary goals of care discussion 12/17/2020 with daughters [place] and [place]. Code Status:  full code Disposition: Stepdown  Labs   CBC: Recent Labs  Lab 12/17/20 1223  WBC 9.0  8.8  NEUTROABS 7.1  HGB 10.3*   10.3*  HCT 32.5*  32.9*  MCV 91.3  91.1  PLT 325  009    Basic Metabolic Panel: Recent Labs  Lab 12/17/20 1223  NA 137  K 4.4  CL 105  CO2 25  GLUCOSE 188*  BUN 43*  CREATININE 0.88  CALCIUM 8.8*   GFR: Estimated Creatinine Clearance: 38.8 mL/min (by C-G formula based on SCr of 0.88 mg/dL). Recent Labs  Lab 12/17/20 1223  WBC 9.0  8.8    Liver Function Tests: Recent Labs  Lab 12/17/20 1223  AST 19  ALT 15  ALKPHOS 56  BILITOT 1.0  PROT 6.9  ALBUMIN 3.8   No results for input(s): LIPASE, AMYLASE in the last 168 hours. Recent Labs  Lab 12/17/20 1434  AMMONIA 19    ABG    Component Value Date/Time   HCO3 28.1 (H) 12/17/2020 1701   O2SAT 75.3 12/17/2020 1701     Coagulation Profile: Recent Labs  Lab 12/17/20 1223  INR 1.0    Cardiac Enzymes: No results for input(s): CKTOTAL, CKMB, CKMBINDEX, TROPONINI in the last 168 hours.  HbA1C: Hgb A1c MFr Bld  Date/Time Value Ref Range Status  11/23/2019 03:30 PM 6.9 (H) 4.8 - 5.6 % Final    Comment:    (NOTE) Pre diabetes:          5.7%-6.4% Diabetes:              >6.4% Glycemic control for   <7.0% adults with diabetes     CBG: Recent Labs  Lab 12/17/20 1213  GLUCAP 178*    Review of Systems:   UTA-patient unable to provide history of precipitating events. Past Medical History:  She,  has a past medical history of Anemia, Aortic valve sclerosis, Arrhythmia, Arthritis, Basal cell carcinoma of skin, Brain tumor ([place]), Brain tumor ([place]), Cervical spine disease, CHF (congestive heart failure) ([place]), Diabetes mellitus without complication ([place]), Dysrhythmia, Esophageal stricture, Esophageal ulcer without bleeding, Gastrostomy tube dependent ([place]), GERD (gastroesophageal reflux disease), History of kidney stones, Hyperlipemia, Hypertension, Kidney stones, Leaky heart valve, Meningioma ([facility]), Occlusive mesenteric ischemia ([place]), Pulmonary hypertension ([place]), RLS (restless legs syndrome), and Vertigo.    Surgical History:   Past Surgical History:  Procedure Laterality Date  . ABDOMINAL HYSTERECTOMY    . APPENDECTOMY    . ARTHROGRAM KNEE Left   . BACK SURGERY     CERVIVAL NECK FUSION  . CATARACT EXTRACTION W/PHACO Left 11/11/2018   Procedure: CATARACT EXTRACTION PHACO AND INTRAOCULAR LENS PLACEMENT (IOC) LEFT, DIABETIC;  Surgeon: Birder Robson, MD;  Location: [facility] ORS;  Service: Ophthalmology;  Laterality: Left;  [place]  00:41 CDE 6.41 Fluid pack lot # [id number]  . COLONOSCOPY WITH PROPOFOL N/A 06/20/2017   Procedure: COLONOSCOPY WITH PROPOFOL;  Surgeon: Lollie Sails, MD;  Location: [facility] ENDOSCOPY;  Service: Endoscopy;  Laterality: N/A;  . ESOPHAGOGASTRODUODENOSCOPY (EGD) WITH PROPOFOL N/A 03/14/2015   Procedure: ESOPHAGOGASTRODUODENOSCOPY (EGD) WITH PROPOFOL;  Surgeon: Josefine Class, MD;  Location: [facility] ENDOSCOPY;  Service: Endoscopy;  Laterality: N/A;  . ESOPHAGOGASTRODUODENOSCOPY (EGD) WITH PROPOFOL N/A 03/28/2017   Procedure: ESOPHAGOGASTRODUODENOSCOPY (EGD) WITH  PROPOFOL;  Surgeon: Lollie Sails, MD;  Location[facility] ENDOSCOPY;  Service: Endoscopy;  Laterality: N/A;  . ESOPHAGOGASTRODUODENOSCOPY (EGD) WITH PROPOFOL N/A 06/20/2017   Procedure: ESOPHAGOGASTRODUODENOSCOPY (EGD) WITH PROPOFOL;  Surgeon: Lollie Sails, MD;  Location: [facility] ENDOSCOPY;  Service: Endoscopy;  Laterality: N/A;  . ESOPHAGOGASTRODUODENOSCOPY (EGD) WITH PROPOFOL N/A 10/21/2017   Procedure: ESOPHAGOGASTRODUODENOSCOPY (EGD) WITH PROPOFOL;  Surgeon: Lollie Sails, MD;  Location: [facility] ENDOSCOPY;  Service: Endoscopy;  Laterality: N/A;  . ESOPHAGOGASTRODUODENOSCOPY (EGD) WITH PROPOFOL N/A 04/15/2018   Procedure: ESOPHAGOGASTRODUODENOSCOPY (EGD) WITH PROPOFOL;  Surgeon: Lollie Sails, MD;  Location: [facility] ENDOSCOPY;  Service: Endoscopy;  Laterality: N/A;  . ESOPHAGUS SURGERY     CLOSURE  . EYE SURGERY    . HYSTERECTOMY ABDOMINAL WITH SALPINGECTOMY    . IR GASTROSTOMY TUBE MOD SED  06/11/2018  .  IR GASTROSTOMY TUBE REMOVAL  03/25/2019  . IR REPLACE G-TUBE SIMPLE WO FLUORO  10/19/2020  . PERIPHERAL VASCULAR CATHETERIZATION N/A 01/23/2016   Procedure: Visceral Venography;  Surgeon: Algernon Huxley, MD;  Location: [place] CV LAB;  Service: Cardiovascular;  Laterality: N/A;  . PERIPHERAL VASCULAR CATHETERIZATION  01/23/2016   Procedure: Peripheral Vascular Intervention;  Surgeon: Algernon Huxley, MD;  Location: [place] CV LAB;  Service: Cardiovascular;;  . UPPER ESOPHAGEAL ENDOSCOPIC ULTRASOUND (EUS) N/A 12/05/2017   Procedure: UPPER ESOPHAGEAL ENDOSCOPIC ULTRASOUND (EUS);  Surgeon: Jola Schmidt, MD;  Location: [facility] ENDOSCOPY;  Service: Endoscopy;  Laterality: N/A;  . VISCERAL ANGIOGRAPHY N/A 03/18/2017   Procedure: Visceral Angiography;  Surgeon: Algernon Huxley, MD;  Location: [place] CV LAB;  Service: Cardiovascular;  Laterality: N/A;  . VISCERAL ARTERY INTERVENTION N/A 03/18/2017   Procedure: Visceral Artery Intervention;  Surgeon: Algernon Huxley, MD;  Location: [place] CV LAB;  Service: Cardiovascular;  Laterality: N/A;     Social History:   reports that she has never smoked. She has never used smokeless tobacco. She reports that she does not drink alcohol and does not use drugs.   Family History:  Her family history includes Cancer in her father; Diabetes in her sister; Heart attack in her brother and sister; Heart disease in her father and mother; Hypertension in her mother; Stroke in her mother.   Allergies Allergies  Allergen Reactions  . Elemental Sulfur Diarrhea and Nausea And Vomiting  . Gabapentin Swelling  . Lipitor [Atorvastatin] Other (See Comments)    Muscle aches  . Mevacor [Lovastatin] Other (See Comments)    Muscle aches  . Milk-Related Compounds Other (See Comments)    Large quantities cause headaches   . Septra [Sulfamethoxazole-Trimethoprim] Other (See Comments)    Unknown  . Statins Other (See Comments)    Muscle pain  . Zocor [Simvastatin] Other  (See Comments)    Muscle aches     Home Medications  Prior to Admission medications   Medication Sig Start Date End Date Taking? Authorizing Provider  acetaminophen (TYLENOL) 160 MG/5ML solution Place 20.3 mLs (650 mg total) into feeding tube every 6 (six) hours as needed for mild pain. 06/13/18   Demetrios Loll, MD  allopurinol (ZYLOPRIM) 100 MG tablet Place 100 mg into feeding tube daily.    [provider]  Amino Acids-Protein Hydrolys (FEEDING SUPPLEMENT, PRO-STAT SUGAR FREE 64,) LIQD Place 30 mLs into feeding tube daily. 06/13/18   Demetrios Loll, MD  carvedilol (COREG) 6.25 MG tablet Take 6.25 mg by mouth 2 (two) times daily with a meal.    [provider]  clopidogrel (PLAVIX) 75  MG tablet GIVE 1 TABLET VIA FEEDING TUBE EVERY [name] AS DIRECTED 09/14/20   Algernon Huxley, MD  dextromethorphan-guaiFENesin [facility] DM) 30-600 MG 12hr tablet Take 1 tablet by mouth 2 (two) times daily.    [provider]  diclofenac Sodium (VOLTAREN) 1 % GEL Apply topically 4 (four) times daily.    [provider]  diltiazem (CARDIZEM) 120 MG tablet Take 120 mg by mouth daily.  08/16/19   [provider]  furosemide (LASIX) 10 MG/ML solution Place 60 mg into feeding tube daily.     [provider]  glipiZIDE (GLUCOTROL) 5 MG tablet Place 1 tablet (5 mg total) into feeding tube daily before breakfast. 06/13/18   Demetrios Loll, MD  hydrALAZINE (APRESOLINE) 50 MG tablet Place 1 tablet (50 mg total) into feeding tube 2 (two) times daily. 06/13/18   Demetrios Loll, MD  Hypromellose 0.2 % SOLN Place 1 drop into both eyes 3 (three) times daily as needed (dry eyes).     [provider]  lisinopril (ZESTRIL) 40 MG tablet Take 20 mg by mouth daily.     [provider]  loperamide (IMODIUM) 1 MG/5ML solution Take 3 mg by mouth as needed for diarrhea or loose stools.    [provider]  meclizine (ANTIVERT) 25 MG tablet Place 25 mg into feeding tube as needed for  dizziness.     [provider]  Multiple Vitamin (MULTIVITAMIN) LIQD Place 15 mLs into feeding tube daily. 06/13/18   Demetrios Loll, MD  Nutritional Supplements (FEEDING SUPPLEMENT, JEVITY 1.5 CAL/FIBER,) LIQD Place 237 mLs into feeding tube 4 (four) times daily. 06/25/18   Dustin Flock, MD  pantoprazole sodium (PROTONIX) 40 mg/20 mL PACK Place 20 mLs (40 mg total) into feeding tube 2 (two) times daily. Patient not taking: No sig reported 06/25/18   Dustin Flock, MD  Pedialyte (PEDIALYTE) SOLN Take by mouth in the morning, at noon, in the evening, and at bedtime.    [provider]  rOPINIRole (REQUIP) 2 MG tablet Place 1 tablet (2 mg total) into feeding tube 2 (two) times daily. 06/13/18   Demetrios Loll, MD  spironolactone (ALDACTONE) 25 MG tablet Take 25 mg by mouth daily. 12/04/19   [provider]  vitamin C (VITAMIN C) 250 MG tablet Place 1 tablet (250 mg total) into feeding tube 2 (two) times daily. 06/13/18   Demetrios Loll, MD  Water For Irrigation, Sterile (FREE WATER) SOLN Place 30 mLs into feeding tube every 4 (four) hours. 06/25/18   Dustin Flock, MD     Critical care time: 45 minutes       Venetia Night, AGACNP-BC Acute Care Nurse Practitioner [facility]   [phone] / [phone] Please see Amion for pager details.

## 2020-12-17 NOTE — ED Provider Notes (Signed)
[name] Provider Note  ____________________________________________  Time seen: Approximately 1:58 PM  I have reviewed the triage vital signs and the nursing notes.   HISTORY  Chief Complaint Weakness and Altered Mental Status (/)     HPI [name] is a 87 y.o. female who presents the emergency department with her daughter for complaint of malaise, confusion, weakness.  According to patient and family member, she was her normal self yesterday afternoon.  She is typically active, ambulatory and largely independent.  Patient states that she started to feel off this morning at some time unsure of exact time.  Patient's daughter saw her roughly at 22 this morning and states that she was not quite herself.  She was confused, not answering questions appropriately.  She was more "lethargic/slow" than normal.  Patient has a history of anemia, aortic valve sclerosis, arrhythmia, CHF, diabetes.  Patient provides feedings through feeding tube.  Reportedly patient did not take her medicines this morning due to not feeling right.         Past Medical History:  Diagnosis Date  . Anemia    IRON INFUSIONS  . Aortic valve sclerosis   . Arrhythmia   . Arthritis    osteoarthritis  . Basal cell carcinoma of skin   . Brain tumor ([place])   . Brain tumor ([employer])   . Cervical spine disease   . CHF (congestive heart failure) ([place])   . Diabetes mellitus without complication ([place])   . Dysrhythmia    sinus arrhythmia  . Esophageal stricture    severe, led to feeding tube placement in Sept 2019  . Esophageal ulcer without bleeding   . Gastrostomy tube dependent ([place])    DOES NOT EAT OR DRINK   . GERD (gastroesophageal reflux disease)   . History of kidney stones   . Hyperlipemia   . Hypertension   . Kidney stones   . Leaky heart valve   . Meningioma ([place])   . 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 Pulmonary hypertension ([place])   . RLS  (restless legs syndrome)   . Vertigo     Patient Active Problem List   Diagnosis Date Noted  . Acute respiratory failure ([place]) 12/17/2020  . Respiratory failure with hypoxia ([place]) 11/23/2019  . CHF exacerbation ([place]) 11/23/2019  . HFrEF (heart failure with reduced ejection fraction) ([place]) 11/23/2019  . GERD (gastroesophageal reflux disease)   . Gastrostomy tube dependent ([place])   . Esophageal ulcer without bleeding   . Diabetes mellitus without complication ([place])   . Anemia   . Aortic valve disease 08/24/2019  . Gastrostomy tube in place ([place]) 07/31/2019  . RLS (restless legs syndrome) 07/31/2019  . Chest pain, atypical 03/18/2019  . Tricuspid regurgitation 02/13/2019  . Heart palpitations 02/12/2019  . Hyponatremia 06/23/2018  . Protein-calorie malnutrition, severe 06/12/2018  . Esophageal stricture 06/11/2018  . Weight loss 05/29/2018  . Dysphagia 05/29/2018  . Esophageal stenosis 05/05/2018  . Hyperlipemia 04/04/2018  . Itching 01/08/2018  . Medication monitoring encounter 01/08/2018  . Paronychia of toe 01/08/2018  . Esophagitis, CMV ([place]) 01/01/2018  . Occlusive mesenteric ischemia ([place]) 05/28/2017  . Hypertension 04/30/2017  . Mesenteric ischemia ([place]) 03/01/2017  . Iron deficiency anemia 02/28/2017  . PAD (peripheral artery disease) ([place]) 02/05/2017  . Hyperlipidemia type II 04/04/2016  . Pulmonary hypertension ([place]) 01/04/2015  . Generalized OA 12/13/2014  . Leg edema 12/13/2014  . Aortic valve sclerosis 02/19/2014  . Diabetes mellitus (  [place]) 12/25/2013  . Acute, but ill-defined, cerebrovascular disease 12/25/2013  . Benign essential hypertension 12/25/2013  . Unilateral primary osteoarthritis, unspecified knee 12/25/2013  . Personal history of other malignant neoplasm of skin 05/28/2012    Past Surgical History:  Procedure Laterality Date  . ABDOMINAL HYSTERECTOMY    . APPENDECTOMY    . ARTHROGRAM KNEE Left   . BACK SURGERY     CERVIVAL NECK FUSION  .  CATARACT EXTRACTION W/PHACO Left 11/11/2018   Procedure: CATARACT EXTRACTION PHACO AND INTRAOCULAR LENS PLACEMENT (IOC) LEFT, DIABETIC;  Surgeon: Birder Robson, MD;  Location: [facility] ORS;  Service: Ophthalmology;  Laterality: Left;  [place]  00:41 CDE 6.41 Fluid pack lot # [id number]  . COLONOSCOPY WITH PROPOFOL N/A 06/20/2017   Procedure: COLONOSCOPY WITH PROPOFOL;  Surgeon: Lollie Sails, MD;  Location: [facility] ENDOSCOPY;  Service: Endoscopy;  Laterality: N/A;  . ESOPHAGOGASTRODUODENOSCOPY (EGD) WITH PROPOFOL N/A 03/14/2015   Procedure: ESOPHAGOGASTRODUODENOSCOPY (EGD) WITH PROPOFOL;  Surgeon: Josefine Class, MD;  Location: [facility] ENDOSCOPY;  Service: Endoscopy;  Laterality: N/A;  . ESOPHAGOGASTRODUODENOSCOPY (EGD) WITH PROPOFOL N/A 03/28/2017   Procedure: ESOPHAGOGASTRODUODENOSCOPY (EGD) WITH PROPOFOL;  Surgeon: Lollie Sails, MD;  Location: [facility] ENDOSCOPY;  Service: Endoscopy;  Laterality: N/A;  . ESOPHAGOGASTRODUODENOSCOPY (EGD) WITH PROPOFOL N/A 06/20/2017   Procedure: ESOPHAGOGASTRODUODENOSCOPY (EGD) WITH PROPOFOL;  Surgeon: Lollie Sails, MD;  Location: [facility] ENDOSCOPY;  Service: Endoscopy;  Laterality: N/A;  . ESOPHAGOGASTRODUODENOSCOPY (EGD) WITH PROPOFOL N/A 10/21/2017   Procedure: ESOPHAGOGASTRODUODENOSCOPY (EGD) WITH PROPOFOL;  Surgeon: Lollie Sails, MD;  Location: [facility] ENDOSCOPY;  Service: Endoscopy;  Laterality: N/A;  . ESOPHAGOGASTRODUODENOSCOPY (EGD) WITH PROPOFOL N/A 04/15/2018   Procedure: ESOPHAGOGASTRODUODENOSCOPY (EGD) WITH PROPOFOL;  Surgeon: Lollie Sails, MD;  Location: [facility] ENDOSCOPY;  Service: Endoscopy;  Laterality: N/A;  . ESOPHAGUS SURGERY     CLOSURE  . EYE SURGERY    . HYSTERECTOMY ABDOMINAL WITH SALPINGECTOMY    . IR GASTROSTOMY TUBE MOD SED  06/11/2018  . IR GASTROSTOMY TUBE REMOVAL  03/25/2019  . IR REPLACE G-TUBE SIMPLE WO FLUORO  10/19/2020  . PERIPHERAL VASCULAR CATHETERIZATION N/A 01/23/2016   Procedure: Visceral Venography;  Surgeon: Algernon Huxley, MD;  Location: [place] CV LAB;  Service: Cardiovascular;  Laterality: N/A;  . PERIPHERAL VASCULAR CATHETERIZATION  01/23/2016   Procedure: Peripheral Vascular Intervention;  Surgeon: Algernon Huxley, MD;  Location: [place] CV LAB;  Service: Cardiovascular;;  . UPPER ESOPHAGEAL ENDOSCOPIC ULTRASOUND (EUS) N/A 12/05/2017   Procedure: UPPER ESOPHAGEAL ENDOSCOPIC ULTRASOUND (EUS);  Surgeon: Jola Schmidt, MD;  Location: [facility] ENDOSCOPY;  Service: Endoscopy;  Laterality: N/A;  . VISCERAL ANGIOGRAPHY N/A 03/18/2017   Procedure: Visceral Angiography;  Surgeon: Algernon Huxley, MD;  Location: [place] CV LAB;  Service: Cardiovascular;  Laterality: N/A;  . VISCERAL ARTERY INTERVENTION N/A 03/18/2017   Procedure: Visceral Artery Intervention;  Surgeon: Algernon Huxley, MD;  Location: [place] CV LAB;  Service: Cardiovascular;  Laterality: N/A;    Prior to Admission medications   Medication Sig Start Date End Date Taking? Authorizing Provider  acetaminophen (TYLENOL) 160 MG/5ML solution Place 20.3 mLs (650 mg total) into feeding tube every 6 (six) hours as needed for mild pain. 06/13/18   Demetrios Loll, MD  allopurinol (ZYLOPRIM) 100 MG tablet Place 100 mg into feeding tube daily.    [provider]  Amino Acids-Protein Hydrolys (FEEDING SUPPLEMENT, PRO-STAT SUGAR FREE 64,) LIQD Place 30 mLs into feeding tube daily. 06/13/18   Demetrios Loll, MD  carvedilol (COREG) 6.25  MG tablet Take 6.25 mg by mouth 2 (two) times daily with a meal.    [provider]  clopidogrel (PLAVIX) 75 MG tablet GIVE 1 TABLET VIA FEEDING TUBE EVERY [name] AS DIRECTED 09/14/20   Algernon Huxley, MD  dextromethorphan-guaiFENesin [facility] DM) 30-600 MG 12hr tablet Take 1 tablet by mouth 2 (two) times daily.    [provider]  diclofenac Sodium (VOLTAREN) 1 % GEL Apply topically 4 (four) times daily.    [provider]  diltiazem (CARDIZEM) 120 MG tablet Take 120 mg by mouth daily.  08/16/19   [provider]  furosemide (LASIX) 10 MG/ML solution Place 60 mg into feeding tube daily.     [provider]  glipiZIDE (GLUCOTROL) 5 MG tablet Place 1 tablet (5 mg total) into feeding tube daily before breakfast. 06/13/18   Demetrios Loll, MD  hydrALAZINE (APRESOLINE) 50 MG tablet Place 1 tablet (50 mg total) into feeding tube 2 (two) times daily. 06/13/18   Demetrios Loll, MD  Hypromellose 0.2 % SOLN Place 1 drop into both eyes 3 (three) times daily as needed (dry eyes).     [provider]  lisinopril (ZESTRIL) 40 MG tablet Take 20 mg by mouth daily.     [provider]  loperamide (IMODIUM) 1 MG/5ML solution Take 3 mg by mouth as needed for diarrhea or loose stools.    [provider]  meclizine (ANTIVERT) 25 MG tablet Place 25 mg into feeding tube as needed for dizziness.     [provider]  Multiple Vitamin (MULTIVITAMIN) LIQD Place 15 mLs into feeding tube daily. 06/13/18   Demetrios Loll, MD  Nutritional Supplements (FEEDING SUPPLEMENT, JEVITY 1.5 CAL/FIBER,) LIQD Place 237 mLs into feeding tube 4 (four) times daily. 06/25/18   Dustin Flock, MD  pantoprazole sodium (PROTONIX) 40 mg/20 mL PACK Place 20 mLs (40 mg total) into feeding tube 2 (two) times daily. Patient not taking: No sig reported 06/25/18   Dustin Flock, MD  Pedialyte (PEDIALYTE) SOLN Take by mouth in the morning, at noon, in the evening, and at bedtime.    [provider]  rOPINIRole (REQUIP) 2 MG tablet Place 1 tablet (2 mg total) into feeding tube 2 (two) times daily. 06/13/18   Demetrios Loll, MD  spironolactone (ALDACTONE) 25 MG tablet Take 25 mg by mouth daily. 12/04/19   [provider]  vitamin C (VITAMIN C) 250 MG tablet Place 1 tablet (250 mg total) into feeding tube 2 (two) times daily. 06/13/18   Demetrios Loll, MD  Water For Irrigation, Sterile (FREE WATER) SOLN Place 30 mLs into feeding tube every 4 (four) hours. 06/25/18   Dustin Flock, MD    Allergies Elemental  sulfur, Gabapentin, Lipitor [atorvastatin], Mevacor [lovastatin], Milk-related compounds, Septra [sulfamethoxazole-trimethoprim], Statins, and Zocor [simvastatin]  Family History  Problem Relation Age of Onset  . Stroke Mother   . Hypertension Mother   . Heart disease Mother   . Cancer Father   . Heart disease Father   . Heart attack Sister   . Diabetes Sister   . Heart attack Brother     Social History Social History   Tobacco Use  . Smoking status: Never Smoker  . Smokeless tobacco: Never Used  Vaping Use  . Vaping Use: Never used  Substance Use Topics  . Alcohol use: Never  . Drug use: Never     Review of Systems  Constitutional: No fever/chills.  Positive for generalized weakness Eyes: No visual changes. No  discharge ENT: No upper respiratory complaints. Cardiovascular: no chest pain. Respiratory: no cough. No SOB. Gastrointestinal: No abdominal pain.  No nausea, no vomiting.  No diarrhea.  No constipation. Genitourinary: Negative for dysuria. No hematuria Musculoskeletal: Negative for musculoskeletal pain. Skin: Negative for rash, abrasions, lacerations, ecchymosis. Neurological: Positive for increased confusion.  Negative for headaches, focal weakness or numbness.  10 System ROS otherwise negative.  ____________________________________________   PHYSICAL EXAM:  VITAL SIGNS: ED Triage Vitals  Enc Vitals Group     BP 12/17/20 1205 (!) 162/65     Pulse Rate 12/17/20 1205 88     Resp 12/17/20 1205 18     Temp 12/17/20 1205 98.2 F (36.8 C)     Temp Source 12/17/20 1205 Oral     SpO2 12/17/20 1205 99 %     Weight 12/17/20 1227 125 lb (56.7 kg)     Height 12/17/20 1227 5\' 7"  (1.702 m)     Head Circumference --      Peak Flow --      Pain Score 12/17/20 1227 0     Pain Loc --      Pain Edu? --      Excl. in [place]? --      Constitutional: Alert and oriented. Well appearing and in no acute distress. Eyes: Conjunctivae are normal. PERRL. EOMI. Head:  Atraumatic. ENT:      Ears:       Nose: No congestion/rhinnorhea.      Mouth/Throat: Mucous membranes are moist.  Neck: No stridor.   Hematological/Lymphatic/Immunilogical: No cervical lymphadenopathy. Cardiovascular: Normal rate, regular rhythm. Normal S1 and S2.  Good peripheral circulation. Respiratory: Normal respiratory effort without tachypnea or retractions. Lungs initially with some faint crackles in the lower lung fields.  Reassessment revealed increased rales and rhonchi throughout lung fields.  Patient went from no increased work of breathing to use of a sensory muscles and mild retractions..  Decreased air entry to the bases with no absent breath sounds. Gastrointestinal: Bowel sounds 4 quadrants. Soft and nontender to palpation. No guarding or rigidity. No palpable masses. No distention. No CVA tenderness. Musculoskeletal: Full range of motion to all extremities. No gross deformities appreciated. Neurologic:  Normal speech and language. No gross focal neurologic deficits are appreciated.  Initially patient was able to complete cranial nerve testing.  There was no gross deficits.  She did have to be redirected a few times, however was able to complete, no significant pronator drift.  Reassessment of the patient revealed that patient was having difficulty following commands.  She was unable to complete cranial nerve testing roughly 3 hours after arrival. Skin:  Skin is warm, dry and intact. No rash noted. Psychiatric: Mood and affect are normal. Speech and behavior are normal. Patient exhibits appropriate insight and judgement.   ____________________________________________   LABS (all labs ordered are listed, but only abnormal results are displayed)  Labs Reviewed  COMPREHENSIVE METABOLIC PANEL - Abnormal; Notable for the following components:      Result Value   Glucose, Bld 188 (*)    BUN 43 (*)    Calcium 8.8 (*)    All other components within normal limits  CBC -  Abnormal; Notable for the following components:   RBC 3.61 (*)    Hemoglobin 10.3 (*)    HCT 32.9 (*)    All other components within normal limits  URINALYSIS, COMPLETE (UACMP) WITH MICROSCOPIC - Abnormal; Notable for the following components:   Color, Urine STRAW (*)  APPearance CLEAR (*)    Hgb urine dipstick SMALL (*)    Protein, ur 30 (*)    All other components within normal limits  BRAIN NATRIURETIC PEPTIDE - Abnormal; Notable for the following components:   B Natriuretic Peptide 982.2 (*)    All other components within normal limits  CBC WITH DIFFERENTIAL/PLATELET - Abnormal; Notable for the following components:   RBC 3.56 (*)    Hemoglobin 10.3 (*)    HCT 32.5 (*)    All other components within normal limits  BLOOD GAS, VENOUS - Abnormal; Notable for the following components:   Bicarbonate 28.1 (*)    All other components within normal limits  CBG MONITORING, ED - Abnormal; Notable for the following components:   Glucose-Capillary 178 (*)    All other components within normal limits  TROPONIN I (HIGH SENSITIVITY) - Abnormal; Notable for the following components:   Troponin I (High Sensitivity) 92 (*)    All other components within normal limits  CULTURE, BLOOD (ROUTINE X 2)  CULTURE, BLOOD (ROUTINE X 2)  RESP PANEL BY RT-PCR (FLU A&B, COVID) ARPGX2  PROTIME-INR  APTT  AMMONIA  CBC  BASIC METABOLIC PANEL  MAGNESIUM  PHOSPHORUS  LIPID PANEL  HEMOGLOBIN A1C  CBG MONITORING, ED  TROPONIN I (HIGH SENSITIVITY)   ____________________________________________  EKG   ____________________________________________  RADIOLOGY I personally viewed and evaluated these images as part of my medical decision making, as well as reviewing the written report by the radiologist.  ED Provider Interpretation: Chest x-ray reveals interstitial edema consistent with pulmonary edema.  Patient has findings consistent with left MCA infarct on initial CT head.  CT Angio Head W or Wo  Contrast  Addendum Date: 12/17/2020   ADDENDUM REPORT: 12/17/2020 19:09 ADDENDUM: Finding omitted from the impression: 2 mm vascular protrusion arising from the supraclinoid right ICA, which may reflect an infundibulum or aneurysm. These results will be called to the ordering clinician or representative by the Radiologist Assistant, and communication documented in the PACS or [employer]. Electronically Signed   By: Kellie Simmering DO   On: 12/17/2020 19:09   Result Date: 12/17/2020 CLINICAL DATA:  Stroke/TIA, assess intracranial arteries. EXAM: CT ANGIOGRAPHY HEAD AND NECK CT PERFUSION BRAIN TECHNIQUE: Multidetector CT imaging of the head and neck was performed using the standard protocol during bolus administration of intravenous contrast. Multiplanar CT image reconstructions and MIPs were obtained to evaluate the vascular anatomy. Carotid stenosis measurements (when applicable) are obtained utilizing NASCET criteria, using the distal internal carotid diameter as the denominator. Multiphase CT imaging of the brain was performed following IV bolus contrast injection. Subsequent parametric perfusion maps were calculated using RAPID software. CONTRAST:  148mL OMNIPAQUE IOHEXOL 350 MG/ML SOLN COMPARISON:  Noncontrast head CT performed earlier today 12/17/2020. Brain MRI 06/08/2008. FINDINGS: CTA NECK FINDINGS Aortic arch: Standard aortic branching. Atherosclerotic plaque within the visualized aortic arch and proximal major branch vessels of the neck. No hemodynamically significant innominate or proximal right subclavian artery stenosis. Prominent soft and calcified plaque within the proximal left subclavian artery with apparent 80% stenosis (however, this stenosis could be accentuated by blooming artifact from calcified plaque) (series 6, image 67). Right carotid system: CCA and ICA patent within the neck. Soft and calcified plaque within the distal CCA, carotid bifurcation and proximal ICA. Estimated 60%  stenosis of the proximal ICA. Left carotid system: CCA and ICA patent within the neck without significant stenosis (50% or greater). Mild soft and calcified plaque within the mid to distal  CCA. Moderate predominantly calcified plaque within the carotid bifurcation and proximal ICA. Vertebral arteries: Codominant patent within the neck. Nonstenotic soft and calcified plaque at the origin of the right vertebral artery. Prominent calcified plaque at the origin of the left vertebral artery with suspected moderate stenosis. Skeleton: No acute bony abnormality or aggressive osseous lesion. Prior C5-C7 ACDF. Cervical spondylosis. Other neck: No neck mass or cervical lymphadenopathy. Upper chest: Ground-glass opacity within the imaged lung apices likely reflecting edema. Partially imaged bilateral pleural effusions Review of the MIP images confirms the above findings CTA HEAD FINDINGS Anterior circulation: The intracranial internal carotid arteries are patent. Severe stenosis of the supraclinoid left ICA. Moderate/severe stenosis of the supraclinoid right ICA. The M1 middle cerebral arteries are patent. There is a distal left M2 branch occlusion within the infarction territory (series 11, image 28). There is a high-grade stenosis within an adjacent distal left M2 branch (series 11, image 28). Additionally, there are foci of high-grade stenosis within multiple proximal left M2 vessels (series 9, image 47). Moderate/severe stenoses within multiple proximal right M2 branch vessels (series 10, image 17) (series 11, image 14). The anterior cerebral arteries are patent. High-grade focal stenosis within the right anterior cerebral artery at the A2/A3 junction (series 11, image 21). 2 mm vascular protrusion arising from the supraclinoid right ICA which may reflect an infundibulum or aneurysm (series 6, image 228). Posterior circulation: Calcified plaque within the bilateral vertebral arteries at the skull base level and at the  level of the proximal V4 segments with moderate stenosis bilaterally. The basilar artery is patent. The posterior cerebral arteries are patent. The posterior communicating arteries are hypoplastic or absent bilaterally. Venous sinuses: Within the limitations of contrast timing, no convincing thrombus. Anatomic variants: As described Review of the MIP images confirms the above findings CT Brain Perfusion Findings: CBF (<30%) Volume: 64mL Perfusion (Tmax>6.0s) volume: 86mL Mismatch Volume: 12mL Infarction Location:Left MCA vascular territory Emergent findings discussed with Dr. Parke Simmers at 4:18 p.m. on 12/17/2020 IMPRESSION: CTA neck: 1. The common carotid and internal carotid arteries are patent within the neck. 2. Estimated 60% atherosclerotic stenosis at the origin of the right ICA. 3. Vertebral arteries patent within the neck. Prominent calcified plaque at the origin of the left vertebral artery with suspected moderate stenosis at this site. 4. Apparent 80% atherosclerotic stenosis within the proximal left subclavian artery. This stenosis could potentially be accentuated by blooming artifact from prominent calcified plaque at this site. CTA head: 1. Distal left M2 MCA branch occlusion in the acute infarction territory. 2. High-grade stenosis within an adjacent distal left M2 MCA branch vessel. 3. High-grade stenoses within multiple proximal left M2 MCA branch vessels. 4. Moderate/severe stenoses within multiple proximal right M2 MCA branch vessels. 5. Severe stenosis of the supraclinoid left ICA. 6. Moderate/severe stenosis of the supraclinoid 7. Severe stenosis within the right anterior cerebral artery at the A2/A3 junction. 8. Moderate stenosis within the bilateral vertebral arteries at the level of the skull base and proximal V4 segments CT perfusion head: The perfusion software identifies a 14 mL core infarct within the left MCA vascular territory. The perfusion software identifies a 28 mL region of hypoperfused  parenchyma within the left MCA vascular territory. Reported mismatch volume: 14 mL. Electronically Signed: By: Kellie Simmering DO On: 12/17/2020 16:58   DG Chest 2 View  Result Date: 12/17/2020 CLINICAL DATA:  Altered mental status. Additional history provided: Low oxygen level, history of CHF, arrhythmia, hypertension. EXAM: CHEST - 2 VIEW COMPARISON:  Prior chest radiographs 11/23/2019 and earlier. FINDINGS: Unchanged cardiomegaly. Aortic atherosclerosis. Bilateral interstitial and ill-defined airspace opacities are similar to the prior examination of 11/23/2019. Small left pleural effusion. No evidence of pneumothorax. No acute bony abnormality identified. ACDF hardware within the lower cervical spine. IMPRESSION: Bilateral interstitial and ill-defined airspace opacities are similar to the prior examination of 11/23/2019 and likely reflect pulmonary edema. Infection cannot be excluded and clinical correlation is recommended. Small left pleural effusion. Unchanged cardiomegaly. Aortic Atherosclerosis (ICD10-I70.0). Electronically Signed   By: Kellie Simmering DO   On: 12/17/2020 14:47   CT HEAD WO CONTRAST  Result Date: 12/17/2020 CLINICAL DATA:  Transient ischemic attack (TIA). EXAM: CT HEAD WITHOUT CONTRAST TECHNIQUE: Contiguous axial images were obtained from the base of the skull through the vertex without intravenous contrast. COMPARISON:  Brain MRI 06/08/2008. FINDINGS: Brain: Mild generalized cerebral atrophy. 4.9 x 3.3 x 5.4 cm acute to subacute appearing cortical and subcortical left MCA territory infarct within the lateral left frontoparietal lobes, posterosuperior left temporal lobe and left insula. No evidence of hemorrhagic conversion. No significant mass effect. Background mild patchy and ill-defined hypoattenuation within the cerebral white matter is nonspecific, but compatible chronic small vessel ischemic disease. No extra-axial fluid collection. No evidence of intracranial mass. No midline  shift. Vascular: No hyperdense vessel.  Atherosclerotic calcifications Skull: Normal. Negative for fracture or focal lesion. Sinuses/Orbits: Visualized orbits show no acute finding. Right sphenoid sinus air-fluid level. Mild mucosal thickening within the left sphenoid sinus. Mild bilateral ethmoid sinus mucosal thickening. These results were called by telephone at the time of interpretation on 12/17/2020 at 2:43 pm to provider Dr. Charna Archer, who verbally acknowledged these results. IMPRESSION: 4.9 x 3.3 x 5.4 cm acute/subacute appearing cortical and subcortical left MCA territory infarct within the left frontoparietal and left temporal lobes as well as left insula. No evidence of hemorrhagic conversion. No significant mass effect. Background mild cerebral atrophy and chronic small vessel ischemic disease. Paranasal sinus disease as described. Correlate for acute sinusitis. Electronically Signed   By: Kellie Simmering DO   On: 12/17/2020 14:44   CT Angio Neck W and/or Wo Contrast  Addendum Date: 12/17/2020   ADDENDUM REPORT: 12/17/2020 19:09 ADDENDUM: Finding omitted from the impression: 2 mm vascular protrusion arising from the supraclinoid right ICA, which may reflect an infundibulum or aneurysm. These results will be called to the ordering clinician or representative by the Radiologist Assistant, and communication documented in the PACS or [employer]. Electronically Signed   By: Kellie Simmering DO   On: 12/17/2020 19:09   Result Date: 12/17/2020 CLINICAL DATA:  Stroke/TIA, assess intracranial arteries. EXAM: CT ANGIOGRAPHY HEAD AND NECK CT PERFUSION BRAIN TECHNIQUE: Multidetector CT imaging of the head and neck was performed using the standard protocol during bolus administration of intravenous contrast. Multiplanar CT image reconstructions and MIPs were obtained to evaluate the vascular anatomy. Carotid stenosis measurements (when applicable) are obtained utilizing NASCET criteria, using the distal internal  carotid diameter as the denominator. Multiphase CT imaging of the brain was performed following IV bolus contrast injection. Subsequent parametric perfusion maps were calculated using RAPID software. CONTRAST:  180mL OMNIPAQUE IOHEXOL 350 MG/ML SOLN COMPARISON:  Noncontrast head CT performed earlier today 12/17/2020. Brain MRI 06/08/2008. FINDINGS: CTA NECK FINDINGS Aortic arch: Standard aortic branching. Atherosclerotic plaque within the visualized aortic arch and proximal major branch vessels of the neck. No hemodynamically significant innominate or proximal right subclavian artery stenosis. Prominent soft and calcified plaque within the proximal left subclavian artery with  apparent 80% stenosis (however, this stenosis could be accentuated by blooming artifact from calcified plaque) (series 6, image 67). Right carotid system: CCA and ICA patent within the neck. Soft and calcified plaque within the distal CCA, carotid bifurcation and proximal ICA. Estimated 60% stenosis of the proximal ICA. Left carotid system: CCA and ICA patent within the neck without significant stenosis (50% or greater). Mild soft and calcified plaque within the mid to distal CCA. Moderate predominantly calcified plaque within the carotid bifurcation and proximal ICA. Vertebral arteries: Codominant patent within the neck. Nonstenotic soft and calcified plaque at the origin of the right vertebral artery. Prominent calcified plaque at the origin of the left vertebral artery with suspected moderate stenosis. Skeleton: No acute bony abnormality or aggressive osseous lesion. Prior C5-C7 ACDF. Cervical spondylosis. Other neck: No neck mass or cervical lymphadenopathy. Upper chest: Ground-glass opacity within the imaged lung apices likely reflecting edema. Partially imaged bilateral pleural effusions Review of the MIP images confirms the above findings CTA HEAD FINDINGS Anterior circulation: The intracranial internal carotid arteries are patent.  Severe stenosis of the supraclinoid left ICA. Moderate/severe stenosis of the supraclinoid right ICA. The M1 middle cerebral arteries are patent. There is a distal left M2 branch occlusion within the infarction territory (series 11, image 28). There is a high-grade stenosis within an adjacent distal left M2 branch (series 11, image 28). Additionally, there are foci of high-grade stenosis within multiple proximal left M2 vessels (series 9, image 47). Moderate/severe stenoses within multiple proximal right M2 branch vessels (series 10, image 17) (series 11, image 14). The anterior cerebral arteries are patent. High-grade focal stenosis within the right anterior cerebral artery at the A2/A3 junction (series 11, image 21). 2 mm vascular protrusion arising from the supraclinoid right ICA which may reflect an infundibulum or aneurysm (series 6, image 228). Posterior circulation: Calcified plaque within the bilateral vertebral arteries at the skull base level and at the level of the proximal V4 segments with moderate stenosis bilaterally. The basilar artery is patent. The posterior cerebral arteries are patent. The posterior communicating arteries are hypoplastic or absent bilaterally. Venous sinuses: Within the limitations of contrast timing, no convincing thrombus. Anatomic variants: As described Review of the MIP images confirms the above findings CT Brain Perfusion Findings: CBF (<30%) Volume: 44mL Perfusion (Tmax>6.0s) volume: 39mL Mismatch Volume: 91mL Infarction Location:Left MCA vascular territory Emergent findings discussed with Dr. Parke Simmers at 4:18 p.m. on 12/17/2020 IMPRESSION: CTA neck: 1. The common carotid and internal carotid arteries are patent within the neck. 2. Estimated 60% atherosclerotic stenosis at the origin of the right ICA. 3. Vertebral arteries patent within the neck. Prominent calcified plaque at the origin of the left vertebral artery with suspected moderate stenosis at this site. 4. Apparent 80%  atherosclerotic stenosis within the proximal left subclavian artery. This stenosis could potentially be accentuated by blooming artifact from prominent calcified plaque at this site. CTA head: 1. Distal left M2 MCA branch occlusion in the acute infarction territory. 2. High-grade stenosis within an adjacent distal left M2 MCA branch vessel. 3. High-grade stenoses within multiple proximal left M2 MCA branch vessels. 4. Moderate/severe stenoses within multiple proximal right M2 MCA branch vessels. 5. Severe stenosis of the supraclinoid left ICA. 6. Moderate/severe stenosis of the supraclinoid 7. Severe stenosis within the right anterior cerebral artery at the A2/A3 junction. 8. Moderate stenosis within the bilateral vertebral arteries at the level of the skull base and proximal V4 segments CT perfusion head: The perfusion software identifies a 14  mL core infarct within the left MCA vascular territory. The perfusion software identifies a 28 mL region of hypoperfused parenchyma within the left MCA vascular territory. Reported mismatch volume: 14 mL. Electronically Signed: By: Kellie Simmering DO On: 12/17/2020 16:58   CT CEREBRAL PERFUSION W CONTRAST  Addendum Date: 12/17/2020   ADDENDUM REPORT: 12/17/2020 19:09 ADDENDUM: Finding omitted from the impression: 2 mm vascular protrusion arising from the supraclinoid right ICA, which may reflect an infundibulum or aneurysm. These results will be called to the ordering clinician or representative by the Radiologist Assistant, and communication documented in the PACS or [employer]. Electronically Signed   By: Kellie Simmering DO   On: 12/17/2020 19:09   Result Date: 12/17/2020 CLINICAL DATA:  Stroke/TIA, assess intracranial arteries. EXAM: CT ANGIOGRAPHY HEAD AND NECK CT PERFUSION BRAIN TECHNIQUE: Multidetector CT imaging of the head and neck was performed using the standard protocol during bolus administration of intravenous contrast. Multiplanar CT image reconstructions  and MIPs were obtained to evaluate the vascular anatomy. Carotid stenosis measurements (when applicable) are obtained utilizing NASCET criteria, using the distal internal carotid diameter as the denominator. Multiphase CT imaging of the brain was performed following IV bolus contrast injection. Subsequent parametric perfusion maps were calculated using RAPID software. CONTRAST:  131mL OMNIPAQUE IOHEXOL 350 MG/ML SOLN COMPARISON:  Noncontrast head CT performed earlier today 12/17/2020. Brain MRI 06/08/2008. FINDINGS: CTA NECK FINDINGS Aortic arch: Standard aortic branching. Atherosclerotic plaque within the visualized aortic arch and proximal major branch vessels of the neck. No hemodynamically significant innominate or proximal right subclavian artery stenosis. Prominent soft and calcified plaque within the proximal left subclavian artery with apparent 80% stenosis (however, this stenosis could be accentuated by blooming artifact from calcified plaque) (series 6, image 67). Right carotid system: CCA and ICA patent within the neck. Soft and calcified plaque within the distal CCA, carotid bifurcation and proximal ICA. Estimated 60% stenosis of the proximal ICA. Left carotid system: CCA and ICA patent within the neck without significant stenosis (50% or greater). Mild soft and calcified plaque within the mid to distal CCA. Moderate predominantly calcified plaque within the carotid bifurcation and proximal ICA. Vertebral arteries: Codominant patent within the neck. Nonstenotic soft and calcified plaque at the origin of the right vertebral artery. Prominent calcified plaque at the origin of the left vertebral artery with suspected moderate stenosis. Skeleton: No acute bony abnormality or aggressive osseous lesion. Prior C5-C7 ACDF. Cervical spondylosis. Other neck: No neck mass or cervical lymphadenopathy. Upper chest: Ground-glass opacity within the imaged lung apices likely reflecting edema. Partially imaged bilateral  pleural effusions Review of the MIP images confirms the above findings CTA HEAD FINDINGS Anterior circulation: The intracranial internal carotid arteries are patent. Severe stenosis of the supraclinoid left ICA. Moderate/severe stenosis of the supraclinoid right ICA. The M1 middle cerebral arteries are patent. There is a distal left M2 branch occlusion within the infarction territory (series 11, image 28). There is a high-grade stenosis within an adjacent distal left M2 branch (series 11, image 28). Additionally, there are foci of high-grade stenosis within multiple proximal left M2 vessels (series 9, image 47). Moderate/severe stenoses within multiple proximal right M2 branch vessels (series 10, image 17) (series 11, image 14). The anterior cerebral arteries are patent. High-grade focal stenosis within the right anterior cerebral artery at the A2/A3 junction (series 11, image 21). 2 mm vascular protrusion arising from the supraclinoid right ICA which may reflect an infundibulum or aneurysm (series 6, image 228). Posterior circulation: Calcified plaque  within the bilateral vertebral arteries at the skull base level and at the level of the proximal V4 segments with moderate stenosis bilaterally. The basilar artery is patent. The posterior cerebral arteries are patent. The posterior communicating arteries are hypoplastic or absent bilaterally. Venous sinuses: Within the limitations of contrast timing, no convincing thrombus. Anatomic variants: As described Review of the MIP images confirms the above findings CT Brain Perfusion Findings: CBF (<30%) Volume: 71mL Perfusion (Tmax>6.0s) volume: 65mL Mismatch Volume: 10mL Infarction Location:Left MCA vascular territory Emergent findings discussed with Dr. Parke Simmers at 4:18 p.m. on 12/17/2020 IMPRESSION: CTA neck: 1. The common carotid and internal carotid arteries are patent within the neck. 2. Estimated 60% atherosclerotic stenosis at the origin of the right ICA. 3. Vertebral  arteries patent within the neck. Prominent calcified plaque at the origin of the left vertebral artery with suspected moderate stenosis at this site. 4. Apparent 80% atherosclerotic stenosis within the proximal left subclavian artery. This stenosis could potentially be accentuated by blooming artifact from prominent calcified plaque at this site. CTA head: 1. Distal left M2 MCA branch occlusion in the acute infarction territory. 2. High-grade stenosis within an adjacent distal left M2 MCA branch vessel. 3. High-grade stenoses within multiple proximal left M2 MCA branch vessels. 4. Moderate/severe stenoses within multiple proximal right M2 MCA branch vessels. 5. Severe stenosis of the supraclinoid left ICA. 6. Moderate/severe stenosis of the supraclinoid 7. Severe stenosis within the right anterior cerebral artery at the A2/A3 junction. 8. Moderate stenosis within the bilateral vertebral arteries at the level of the skull base and proximal V4 segments CT perfusion head: The perfusion software identifies a 14 mL core infarct within the left MCA vascular territory. The perfusion software identifies a 28 mL region of hypoperfused parenchyma within the left MCA vascular territory. Reported mismatch volume: 14 mL. Electronically Signed: By: Kellie Simmering DO On: 12/17/2020 16:58   DG Chest Portable 1 View  Result Date: 12/17/2020 CLINICAL DATA:  Weakness, shortness of breath and hypotension. EXAM: PORTABLE CHEST 1 VIEW COMPARISON:  PA and lateral chest 12/17/2020. FINDINGS: There is cardiomegaly and pulmonary edema. Aeration appears improved. No focal airspace disease. Small bilateral pleural effusions, greater on the left. IMPRESSION: Improved aeration compared to the study earlier today. Cardiomegaly, interstitial edema and small left effusion. Electronically Signed   By: Inge Rise M.D.   On: 12/17/2020 17:40    ____________________________________________    PROCEDURES  Procedure(s) performed:     .Critical Care Performed by: Darletta Moll, PA-C Authorized by: Darletta Moll, PA-C   Critical care provider statement:    Critical care time (minutes):  78   Critical care time was exclusive of:  Separately billable procedures and treating other patients and teaching time   Critical care was necessary to treat or prevent imminent or life-threatening deterioration of the following conditions:  CNS failure or compromise and respiratory failure   Critical care was time spent personally by me on the following activities:  Obtaining history from patient or surrogate, examination of patient, evaluation of patient's response to treatment, discussions with consultants, development of treatment plan with patient or surrogate, ordering and performing treatments and interventions, ordering and review of laboratory studies, ordering and review of radiographic studies, pulse oximetry, re-evaluation of patient's condition and review of old charts   Care discussed with: admitting provider        Medications  nitroGLYCERIN 50 mg in dextrose 5 % 250 mL (0.2 mg/mL) infusion (0 mcg/min Intravenous Stopped 12/17/20 1642)  norepinephrine (LEVOPHED) 4mg  in 225mL premix infusion (0 mcg/min Intravenous Stopped 12/17/20 1756)  docusate sodium (COLACE) capsule 100 mg (has no administration in time range)  polyethylene glycol (MIRALAX / GLYCOLAX) packet 17 g (has no administration in time range)  insulin aspart (novoLOG) injection 0-9 Units (has no administration in time range)  furosemide (LASIX) injection 60 mg (60 mg Intravenous Given 12/17/20 1453)  iohexol (OMNIPAQUE) 350 MG/ML injection 100 mL (100 mLs Intravenous Contrast Given 12/17/20 1548)     ____________________________________________   INITIAL IMPRESSION / ASSESSMENT AND PLAN / ED COURSE  Pertinent labs & imaging results that were available during my care of the patient were reviewed by me and considered in my medical decision  making (see chart for details).  Review of the [place] CSRS was performed in accordance of the [place] prior to dispensing any controlled drugs.  Clinical Course as of 12/17/20 2022  Sat Dec 17, 2020  1401 Patient developed some shortness of breath, was noted to be hypoxic on room air.  Patient was started on 4 L via nasal cannula.  Patient had desaturated to 80% on room air. [JC]  1430 Patient became more hypoxic with increased work of breathing after returning from CT scan.  Patient is placed on BiPAP.  Patient has increased work of breathing with significant increase in adventitious lung sounds from arrival.  Concern for fluid overload secondary to her CHF.  Starting nitro drip in addition to BiPAP.  At this time patient is definitely having increased work of breathing and depending on how patient tolerates BiPAP in regards oxygen saturation as well as tiring will consider intubation if needed.  Patient's family is agreeable to intubation if it proceeds to that point. [JC]  5956 Patient started to become hypotensive with nitro drip.  This is temporarily stopped.  If patient is blood pressure responds, will resume nitro for her CHF exacerbation [JC]  1643 Patient had been assessed by neurology.  Patient is outside of the TPA window given the unknown last well time, however it is definitely known that patient was unwell 6 hours previously.  Neurology recommended CT angio head and neck with perfusion study.  This is been performed.  Awaiting neurology's recommendations. [JC]    Clinical Course User Index [JC] Priscille Shadduck, Charline Bills, PA-C          Patient's diagnosis is consistent with CVA, CHF exacerbation.  Patient presented to the emergency department with description of confusion and generalized malaise.  Patient initially was alert and oriented, able to answer questions, follow commands appropriately.  Patient was in no distress on her arrival.  Patient developed increased respiratory symptoms with  falling O2 saturation.  Patient had been moved to a room and placed on 4 L via nasal cannula with good return of oxygen saturation.  Patient went to CT and x-ray off of oxygen and returned significantly hypoxic, with increased work of breathing.  Reassessment of the patient revealed significant confusion, inability to follow commands as before.  Patient had increased respiratory effort with rales throughout lungs.  Given this status change, Lasix was administered and patient was started on BiPAP.  Initial concern that patient may quickly tire require intubation I discussed this with family who requested that all measures be performed on the patient.  Patient's O2 saturation did improve with BiPAP and her work of breathing has decreased somewhat.  However patient has not able to answer commands currently as well as provide input into her condition.  Initial CT revealed  findings consistent with an acute left MCA infarct.  Patient had code stroke called at that time.  Neurology evaluated the patient requested that we perform CT angio head and neck with perfusion study.  Patient was accompanied to CT. and there was some discussion as patient became hypotensive that patient may need to be transferred to [facility].  We had stopped the patient's nitro drip roughly 30 minutes before she became hypotensive.  She was given a pressor for roughly 45 minutes and then returned to permissive hypertension.  Pressor was stopped and patient has maintained good vitals.  At this time neurology recommends no further intervention in regards to the patient's CVA.  Patient is to continue management for her CHF exacerbation as well.  I discussed the case with the intensivist who agrees to admit patient at this time.    ____________________________________________  FINAL CLINICAL IMPRESSION(S) / ED DIAGNOSES  Final diagnoses:  Acute on chronic systolic congestive heart failure (HCC)  Cerebrovascular accident (CVA) due to  occlusion of left middle cerebral artery ([place])      NEW MEDICATIONS STARTED DURING THIS VISIT:  ED Discharge Orders    None          This chart was dictated using voice recognition software/Dragon. Despite best efforts to proofread, errors can occur which can change the meaning. Any change was purely unintentional.    Darletta Moll, PA-C 12/17/20 2022    Vanessa [place]oxo [place], MD 12/19/20 [phone]

## 2020-12-17 NOTE — ED Notes (Signed)
Pt now able to state that she needed to urinate. Informed she could urinate d/t purewick.

## 2020-12-17 NOTE — ED Notes (Addendum)
Pt now able to follow commands for neurologist who just arrived at bedside.

## 2020-12-17 NOTE — ED Notes (Signed)
While drawing blood pt became tachypnic and daughter kept shouting "she's having trouble breathing" checked pt oxygen level and was 81% on RA. Placed on 4L [place] and came up to 97%. PA informed.

## 2020-12-17 NOTE — ED Provider Notes (Signed)
   [facility] Emergency Department Provider Note PROCEDURES  Procedure(s) performed (including Critical Care):  .Critical Care Performed by: Vanessa Riner, MD Authorized by: Vanessa Alma, MD   Critical care provider statement:    Critical care time (minutes):  35   Critical care was necessary to treat or prevent imminent or life-threatening deterioration of the following conditions:  Respiratory failure and CNS failure or compromise   Critical care was time spent personally by me on the following activities:  Discussions with consultants, evaluation of patient's response to treatment, examination of patient, ordering and performing treatments and interventions, ordering and review of laboratory studies, ordering and review of radiographic studies, pulse oximetry, re-evaluation of patient's condition, obtaining history from patient or surrogate and review of old charts   Patient initially got hypotensive so nitro drip was discontinued.  Patient was slow to recover blood pressures and so patient was briefly placed on Levophed to help maintain pressures due to acute stroke.  However blood pressure stablalized on the Levophed so we discontinued the Levophed.  Neurology MD was at bedside and given the blood pressures were becoming stable pressures she felt comfortable with patient staying here.  There was no acute intervention that would be possible for neuro intervention at this time.  Patient looks comfortable on the BiPAP and having good urine output with the Lasix.  We discussed with family and updated them on the above and will admit to our hospital.    ____________________________________________    Vanessa Stearns, MD 12/17/20 [phone]

## 2020-12-17 NOTE — Progress Notes (Signed)
20:30- Alerted by Lysle Morales, care RN in ICU that the patient's RN in the ED called to report a critical result from radiology as an addendum to initial CT angio results. Addendum from CT angio head >> 2 mm vascular protrusion arising from the supraclinoid right ICA which may reflect in infundibulum or aneurysm.  Dr. Cari Caraway with neurosurgery was paged, and he reviewed the imaging personally.  Dr. Cari Caraway relayed that he deemed this as an incidental finding not related to the acute stroke, and no intervention necessary at this time.   Patient's daughters were updated bedside, all questions and concerns answered.   [name], AGACNP-BC Acute Care Nurse Practitioner [facility]   [phone] / [phone] Please see Amion for pager details.

## 2020-12-18 ENCOUNTER — Inpatient Hospital Stay
Admission: EM | Admit: 2020-12-18 | Discharge: 2020-12-18 | Disposition: A | Discharge status: Home / Self Care | Payer: Medicare Other | Source: Home / Self Care | Attending: Pulmonary Disease | Admitting: Pulmonary Disease

## 2020-12-18 ENCOUNTER — Inpatient Hospital Stay: Admission: EM | Admit: 2020-12-18 | Payer: Medicare Other | Source: Home / Self Care | Admitting: Pulmonary Disease

## 2020-12-18 ENCOUNTER — Inpatient Hospital Stay: Payer: Medicare Other

## 2020-12-18 LAB — CBC
HCT: 30.2 % — ABNORMAL LOW (ref 36.0–46.0)
Hemoglobin: 9.8 g/dL — ABNORMAL LOW (ref 12.0–15.0)
MCH: 29.5 pg (ref 26.0–34.0)
MCHC: 32.5 g/dL (ref 30.0–36.0)
MCV: 91 fL (ref 80.0–100.0)
Platelets: 289 10*3/uL (ref 150–400)
RBC: 3.32 MIL/uL — ABNORMAL LOW (ref 3.87–5.11)
RDW: 14.8 % (ref 11.5–15.5)
WBC: 7.1 10*3/uL (ref 4.0–10.5)
nRBC: 0 % (ref 0.0–0.2)

## 2020-12-18 LAB — HEMOGLOBIN A1C
Hgb A1c MFr Bld: 6.9 % — ABNORMAL HIGH (ref 4.8–5.6)
Mean Plasma Glucose: 151.33 mg/dL

## 2020-12-18 LAB — BASIC METABOLIC PANEL
Anion gap: 8 (ref 5–15)
BUN: 42 mg/dL — ABNORMAL HIGH (ref 8–23)
CO2: 27 mmol/L (ref 22–32)
Calcium: 8.3 mg/dL — ABNORMAL LOW (ref 8.9–10.3)
Chloride: 105 mmol/L (ref 98–111)
Creatinine, Ser: 1.11 mg/dL — ABNORMAL HIGH (ref 0.44–1.00)
GFR, Estimated: 48 mL/min — ABNORMAL LOW (ref >60)
Glucose, Bld: 113 mg/dL — ABNORMAL HIGH (ref 70–99)
Potassium: 4.6 mmol/L (ref 3.5–5.1)
Sodium: 140 mmol/L (ref 135–145)

## 2020-12-18 LAB — GLUCOSE, CAPILLARY
Glucose-Capillary: 102 mg/dL — ABNORMAL HIGH (ref 70–99)
Glucose-Capillary: 99 mg/dL (ref 70–99)
Glucose-Capillary: 123 mg/dL — ABNORMAL HIGH (ref 70–99)
Glucose-Capillary: 79 mg/dL (ref 70–99)
Glucose-Capillary: 83 mg/dL (ref 70–99)

## 2020-12-18 LAB — LIPID PANEL
Cholesterol: 178 mg/dL (ref 0–200)
HDL: 56 mg/dL (ref >40)
LDL Cholesterol: 112 mg/dL — ABNORMAL HIGH (ref 0–99)
Total CHOL/HDL Ratio: 3.2 RATIO
Triglycerides: 51 mg/dL (ref <150)
VLDL: 10 mg/dL (ref 0–40)

## 2020-12-18 LAB — MAGNESIUM: Magnesium: 2.2 mg/dL (ref 1.7–2.4)

## 2020-12-18 LAB — PHOSPHORUS: Phosphorus: 4 mg/dL (ref 2.5–4.6)

## 2020-12-18 MED ORDER — CLOPIDOGREL BISULFATE 75 MG PO TABS
300.0000 mg | ORAL_TABLET | Freq: Once | ORAL | Status: AC
  Administered 2020-12-18: 300 mg
  Filled 2020-12-18: qty 4

## 2020-12-18 MED ORDER — CLOPIDOGREL BISULFATE 75 MG PO TABS: 75.0000 mg | ORAL_TABLET | Freq: Every day | ORAL | Status: DC

## 2020-12-18 MED ORDER — CLOPIDOGREL BISULFATE 75 MG PO TABS
75.0000 mg | ORAL_TABLET | Freq: Every day | ORAL | Status: DC
  Administered 2020-12-19 – 2020-12-22 (×4): 75 mg
  Filled 2020-12-18 (×4): qty 1

## 2020-12-18 MED ORDER — CARVEDILOL 6.25 MG PO TABS
6.2500 mg | ORAL_TABLET | Freq: Two times a day (BID) | ORAL | Status: DC
  Administered 2020-12-18 – 2020-12-19 (×2): 6.25 mg via ORAL
  Filled 2020-12-18 (×2): qty 1

## 2020-12-18 MED ORDER — ASPIRIN 325 MG PO TABS
325.0000 mg | ORAL_TABLET | Freq: Once | ORAL | Status: AC
  Administered 2020-12-18: 325 mg
  Filled 2020-12-18: qty 1

## 2020-12-18 MED ORDER — ASPIRIN 81 MG PO CHEW
81.0000 mg | CHEWABLE_TABLET | Freq: Every day | ORAL | Status: DC
  Administered 2020-12-19 – 2020-12-22 (×4): 81 mg
  Filled 2020-12-18 (×4): qty 1

## 2020-12-18 MED ORDER — CHLORHEXIDINE GLUCONATE CLOTH 2 % EX PADS
6.0000 | MEDICATED_PAD | Freq: Every day | CUTANEOUS | Status: DC
  Administered 2020-12-18 – 2020-12-21 (×4): 6 via TOPICAL

## 2020-12-18 MED ORDER — GADOBUTROL 1 MMOL/ML IV SOLN
6.0000 mL | Freq: Once | INTRAVENOUS | Status: AC | PRN
  Administered 2020-12-18: 6 mL via INTRAVENOUS

## 2020-12-18 MED ORDER — ALBUTEROL SULFATE (2.5 MG/3ML) 0.083% IN NEBU: 2.5000 mg | INHALATION_SOLUTION | Freq: Four times a day (QID) | RESPIRATORY_TRACT | Status: DC | PRN

## 2020-12-18 NOTE — Progress Notes (Signed)
Neurology Progress Note  Patient ID: [name] Day is a 87 y.o. with past medical history significant for hypertension, hyperlipidemia, type 2 diabetes, pulmonary hypertension, meningioma, cervical spine stenosis s/p C-spine fusion, congestive heart failure, arrhythmia, severe esophageal stricture s/p feeding tube, sinus arrhythmia not on anticoagulation.   Initially consulted for: Code stroke  Major interval events:  -Respiratory status is improved, now off of BiPAP -Appears she briefly required Levophed overnight but this is improved  Subjective: -Severely aphasic but appears comfortable  Exam: Vitals:   12/18/20 0700 12/18/20 0800  BP: (!) 152/48 (!) 149/60  Pulse: 72 80  Resp: 10 17  Temp:  98.7 F (37.1 C)  SpO2: 99% 100%   Gen: In bed, comfortable  Resp: non-labored breathing, no grossly audible wheezing Cardiac: Perfusing extremities well  Abd: soft, nt  Neuro: MS: Significant receptive as well as productive aphasia.  When asked to show me her tongue she initially opens her mouth and then eventually does slightly protrude her tongue.  She mimics very readily but when asked to show me a thumbs up without my demonstrating she simply shows me her entire hand.  She is unable to name her daughter.  She nods yes when asked if she prefers to be called [name] as well as when she is asked if she prefers to be called [name].  Occasionally she is able to say a simple word such as naming thumb CN: Pupils equal and round, face with a mild right facial droop, EOMI, orients to stimuli in all visual fields, tongue midline Motor: 4/5 in the right upper extremity, 5/5 in the left upper extremity, mild bilateral hip flexor weakness otherwise 5/5 throughout the lower extremities.  Cooperation with confrontational testing is mildly limited by aphasia but with mimicking and repeated instructions in different ways she is able to perform fairly well.  She does not have drift of the right upper  extremity today Sensory: Appears to report that she has mildly reduced sensation on the right, and does appear less reactive to stimulation on the right though given aphasia I cannot be confident that this is present Coordination: Finger-to-nose intact bilaterally (fairly difficult to smoothly assess given her aphasia)  Pertinent Data:  BNP 982.2 Initial troponin 16, now rising to 92  Lab Results  Component Value Date   VITAMINB12 960 (H) 11/23/2019    Lab Results  Component Value Date   HGBA1C 6.9 (H) 11/23/2019   Lab Results  Component Value Date   CHOL 178 12/18/2020   HDL 56 12/18/2020   LDLCALC 112 (H) 12/18/2020   TRIG 51 12/18/2020   CHOLHDL 3.2 12/18/2020   Lab Results  Component Value Date   TSH 4.466 11/23/2019    Head CT with hypodensity in the superior left MCA territory CTA with multifocal stenoses especially in the left MCA territory (which trifurcates) and a fair burden of atherosclerosis throughout CTA/CT perfusion rapid read as 14 mL core infarct and 28 mL of area at risk, likely slightly underestimated and slightly overestimated on my personal review when comparing dry head CT hypodensity to CT perfusion studies MRI brain personally reviewed, full radiology read pending.  Infarct correlating to hypodensity seen on head CT, with some petechial hemorrhage.  Impression: This is an 87 year old woman with multiple vascular risk factors as detailed above, presenting with a left MCA territory stroke out of the window  Recommendations: # Left MCA territory stroke, likely atheroembolic, cannot rule out cardioembolic at this time - MRI brain completed -  Continue neurochecks per protocol - Stroke labs TSH, HgbA1c, fasting lipid panel completed as above,   A1c 6.9, TSH normal at 4.466, LDL elevated at 112 - Echocardiogram completed, read pending - Continue 81 mg daily aspirin lifelong - Plavix 300 mg load with 75 mg daily for 90 day course  - Unfortunately does not  tolerate statin due to muscle pain, will refer to advanced lipid clinic for consideration of PSK 9 inhibitor (order placed) - Risk factor modification - Telemetry monitoring; 30 day event monitor on discharge if no arrythmias captured  - Blood pressure goal: Patient is tolerating blood pressures of 140s/60s with stable neurological examination, long-term goal would be normotension, please titrate blood pressure medications cautiously - PT consult, OT consult, Speech consult - Neurology to follow  # Hypoxic respiratory distress, improving -Likely secondary to hypertension driven by her stroke -now off BiPAP, weaned to nasal cannula -if neurological examination worsens would fluid bolus and attempt to lay flat if respiratory status allows  Lesleigh Noe MD-PhD [facility] [phone]

## 2020-12-18 NOTE — Consult Note (Signed)
PHARMACY CONSULT NOTE - FOLLOW UP  Pharmacy Consult for Electrolyte Monitoring and Replacement   Recent Labs: Potassium (mmol/L)  Date Value  12/18/2020 4.6  12/23/2014 4.1   Magnesium (mg/dL)  Date Value  12/18/2020 2.2   Calcium (mg/dL)  Date Value  12/18/2020 8.3 (L)   Albumin (g/dL)  Date Value  12/17/2020 3.8   Phosphorus (mg/dL)  Date Value  12/18/2020 4.0   Sodium (mmol/L)  Date Value  12/18/2020 140     Assessment: 87 y.o. female with a past medical history significant for hypertension, hyperlipidemia, type 2 diabetes, pulmonary hypertension, meningioma, cervical spine stenosis s/p C-spine fusion, congestive heart failure, arrhythmia, severe esophageal stricture s/p feeding tube, sinus arrhythmia not on anticoagulation. Presented to the emergency department with her daughter for complaint of malaise, confusion, weakness.  Goal of Therapy:  WNL  Plan:  No electrolyte replacement at this time.  F/u with AM labs.   Oswald Hillock ,PharmD Clinical Pharmacist 12/18/2020 11:20 AM

## 2020-12-18 NOTE — Progress Notes (Signed)
*  PRELIMINARY RESULTS* Echocardiogram 2D Echocardiogram has been performed.  Lavell Luster Emry Tobin 12/18/2020, 3:34 PM

## 2020-12-18 NOTE — Plan of Care (Signed)
Neuro exam stable since admission. Pt presents with expressive aphasia and severely slurred speech. Alert, following commands, appropriate with care, decreased smile excursion ride side, slight drift of right arm and slight decreased sensation R side.   Pt off BiPAP this am, doing well now on 2L [place]. Off all vasoactive gtts. In MRI now for brain imaging. Daughter, [name], at bedside, updated and demonstrates understanding.   Plan for PT/OT to mobilize as tolerated.   Problem: Education: Goal: Knowledge of General Education information will improve Description: Including pain rating scale, medication(s)/side effects and non-pharmacologic comfort measures Outcome: Progressing   Problem: Health Behavior/Discharge Planning: Goal: Ability to manage health-related needs will improve Outcome: Progressing   Problem: Clinical Measurements: Goal: Ability to maintain clinical measurements within normal limits will improve Outcome: Progressing Goal: Will remain free from infection Outcome: Progressing Goal: Diagnostic test results will improve Outcome: Progressing Goal: Respiratory complications will improve Outcome: Progressing Goal: Cardiovascular complication will be avoided Outcome: Progressing   Problem: Activity: Goal: Risk for activity intolerance will decrease Outcome: Progressing   Problem: Nutrition: Goal: Adequate nutrition will be maintained Outcome: Progressing   Problem: Coping: Goal: Level of anxiety will decrease Outcome: Progressing   Problem: Elimination: Goal: Will not experience complications related to bowel motility Outcome: Progressing Goal: Will not experience complications related to urinary retention Outcome: Progressing   Problem: Pain Managment: Goal: General experience of comfort will improve Outcome: Progressing   Problem: Safety: Goal: Ability to remain free from injury will improve Outcome: Progressing   Problem: Skin Integrity: Goal: Risk for  impaired skin integrity will decrease Outcome: Progressing

## 2020-12-18 NOTE — Consult Note (Signed)
Referring Physician:  No referring provider defined for this encounter.  Primary Physician:  [name], MD  Chief Complaint:  Possible R ICA Aneurysm  History of Present Illness: 12/18/2020 [name] is a 87 y.o. female who presents with the chief complaint of stroke.  She was last normal 2 days ago, and was brought to the ER yesterday. She was diagnosed with an acute left MCA territory. She was noted to have R arm and leg weakness, and speech difficulty.  She denies HA N V.  She is unable to provide a full history due to speech disturbance (Level 5 qualifier).   Review of Systems:  A 10 point review of systems is negative, except for the pertinent positives and negatives detailed in the HPI.  Past Medical History: Past Medical History:  Diagnosis Date  . Anemia    IRON INFUSIONS  . Aortic valve sclerosis   . Arrhythmia   . Arthritis    osteoarthritis  . Basal cell carcinoma of skin   . Brain tumor ([place])   . Brain tumor ([place])   . Cervical spine disease   . CHF (congestive heart failure) ([place])   . Diabetes mellitus without complication ([place])   . Dysrhythmia    sinus arrhythmia  . Esophageal stricture    severe, led to feeding tube placement in Sept 2019  . Esophageal ulcer without bleeding   . Gastrostomy tube dependent ([place])    DOES NOT EAT OR DRINK   . GERD (gastroesophageal reflux disease)   . History of kidney stones   . Hyperlipemia   . Hypertension   . Kidney stones   . Leaky heart valve   . Meningioma ([place])   . 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 Pulmonary hypertension ([place])   . RLS (restless legs syndrome)   . Vertigo     Past Surgical History: Past Surgical History:  Procedure Laterality Date  . ABDOMINAL HYSTERECTOMY    . APPENDECTOMY    . ARTHROGRAM KNEE Left   . BACK SURGERY     CERVIVAL NECK FUSION  . CATARACT EXTRACTION W/PHACO Left 11/11/2018   Procedure: CATARACT EXTRACTION PHACO AND INTRAOCULAR LENS PLACEMENT (IOC) LEFT,  DIABETIC;  Surgeon: Birder Robson, MD;  Location: [facility] ORS;  Service: Ophthalmology;  Laterality: Left;  [place]  00:41 CDE 6.41 Fluid pack lot # [id number]  . COLONOSCOPY WITH PROPOFOL N/A 06/20/2017   Procedure: COLONOSCOPY WITH PROPOFOL;  Surgeon: Lollie Sails, MD;  Location: [facility] ENDOSCOPY;  Service: Endoscopy;  Laterality: N/A;  . ESOPHAGOGASTRODUODENOSCOPY (EGD) WITH PROPOFOL N/A 03/14/2015   Procedure: ESOPHAGOGASTRODUODENOSCOPY (EGD) WITH PROPOFOL;  Surgeon: Josefine Class, MD;  Location: [facility] ENDOSCOPY;  Service: Endoscopy;  Laterality: N/A;  . ESOPHAGOGASTRODUODENOSCOPY (EGD) WITH PROPOFOL N/A 03/28/2017   Procedure: ESOPHAGOGASTRODUODENOSCOPY (EGD) WITH PROPOFOL;  Surgeon: Lollie Sails, MD;  Location: [facility] ENDOSCOPY;  Service: Endoscopy;  Laterality: N/A;  . ESOPHAGOGASTRODUODENOSCOPY (EGD) WITH PROPOFOL N/A 06/20/2017   Procedure: ESOPHAGOGASTRODUODENOSCOPY (EGD) WITH PROPOFOL;  Surgeon: Lollie Sails, MD;  Location: [facility] ENDOSCOPY;  Service: Endoscopy;  Laterality: N/A;  . ESOPHAGOGASTRODUODENOSCOPY (EGD) WITH PROPOFOL N/A 10/21/2017   Procedure: ESOPHAGOGASTRODUODENOSCOPY (EGD) WITH PROPOFOL;  Surgeon: Lollie Sails, MD;  Location: [facility] ENDOSCOPY;  Service: Endoscopy;  Laterality: N/A;  . ESOPHAGOGASTRODUODENOSCOPY (EGD) WITH PROPOFOL N/A 04/15/2018   Procedure: ESOPHAGOGASTRODUODENOSCOPY (EGD) WITH PROPOFOL;  Surgeon: Lollie Sails, MD;  Location: [facility] ENDOSCOPY;  Service: Endoscopy;  Laterality: N/A;  . ESOPHAGUS SURGERY     CLOSURE  .  EYE SURGERY    . HYSTERECTOMY ABDOMINAL WITH SALPINGECTOMY    . IR GASTROSTOMY TUBE MOD SED  06/11/2018  . IR GASTROSTOMY TUBE REMOVAL  03/25/2019  . IR REPLACE G-TUBE SIMPLE WO FLUORO  10/19/2020  . PERIPHERAL VASCULAR CATHETERIZATION N/A 01/23/2016   Procedure: Visceral Venography;  Surgeon: Algernon Huxley, MD;  Location: [place] CV LAB;  Service: Cardiovascular;  Laterality: N/A;  . PERIPHERAL VASCULAR CATHETERIZATION   01/23/2016   Procedure: Peripheral Vascular Intervention;  Surgeon: Algernon Huxley, MD;  Location: [place] CV LAB;  Service: Cardiovascular;;  . UPPER ESOPHAGEAL ENDOSCOPIC ULTRASOUND (EUS) N/A 12/05/2017   Procedure: UPPER ESOPHAGEAL ENDOSCOPIC ULTRASOUND (EUS);  Surgeon: Jola Schmidt, MD;  Location: [facility] ENDOSCOPY;  Service: Endoscopy;  Laterality: N/A;  . VISCERAL ANGIOGRAPHY N/A 03/18/2017   Procedure: Visceral Angiography;  Surgeon: Algernon Huxley, MD;  Location: [place] CV LAB;  Service: Cardiovascular;  Laterality: N/A;  . VISCERAL ARTERY INTERVENTION N/A 03/18/2017   Procedure: Visceral Artery Intervention;  Surgeon: Algernon Huxley, MD;  Location: [place] CV LAB;  Service: Cardiovascular;  Laterality: N/A;    Allergies: Allergies as of 12/17/2020 - Review Complete 12/17/2020  Allergen Reaction Noted  . Elemental sulfur Diarrhea and Nausea And Vomiting 03/11/2015  . Gabapentin Swelling 03/11/2015  . Lipitor [atorvastatin] Other (See Comments) 03/11/2015  . Mevacor [lovastatin] Other (See Comments) 03/11/2015  . Milk-related compounds Other (See Comments) 03/11/2015  . Septra [sulfamethoxazole-trimethoprim] Other (See Comments) 06/19/2017  . Statins Other (See Comments) 03/11/2015  . Zocor [simvastatin] Other (See Comments) 03/11/2015    Medications:  Current Facility-Administered Medications:  .  0.9 %  sodium chloride infusion, 250 mL, Intravenous, Continuous, Aleskerov, Fuad, MD, Stopped at 12/18/20 [phone] .  albuterol (PROVENTIL) (2.5 MG/3ML) 0.083% nebulizer solution 2.5 mg, 2.5 mg, Nebulization, Q6H PRN, Rust-Chester, Huel Cote, NP .  Derrill Memo ON 12/19/2020] aspirin chewable tablet 81 mg, 81 mg, Per Tube, Daily, Rust-Chester, Huel Cote, NP .  Chlorhexidine Gluconate Cloth 2 % PADS 6 each, 6 each, Topical, Daily, Rust-Chester, Huel Cote, NP .  Derrill Memo ON 12/19/2020] clopidogrel (PLAVIX) tablet 75 mg, 75 mg, Per Tube, Daily, Bhagat, Srishti L, MD .  docusate sodium (COLACE)  capsule 100 mg, 100 mg, Oral, BID PRN, Rust-Chester, Britton L, NP .  insulin aspart (novoLOG) injection 0-9 Units, 0-9 Units, Subcutaneous, Q4H, Rust-Chester, Britton L, NP, 2 Units at 12/17/20 2015 .  nitroGLYCERIN 50 mg in dextrose 5 % 250 mL (0.2 mg/mL) infusion, 0-200 mcg/min, Intravenous, Titrated, Carrie Mew, MD, Stopped at 12/17/20 1641 .  pantoprazole (PROTONIX) injection 40 mg, 40 mg, Intravenous, Q24H, Rust-Chester, Britton L, NP, 40 mg at 12/17/20 2341 .  polyethylene glycol (MIRALAX / GLYCOLAX) packet 17 g, 17 g, Oral, Daily PRN, Rust-Chester, Huel Cote, NP   Social History: Social History   Tobacco Use  . Smoking status: Never Smoker  . Smokeless tobacco: Never Used  Vaping Use  . Vaping Use: Never used  Substance Use Topics  . Alcohol use: Never  . Drug use: Never    Family Medical History: Family History  Problem Relation Age of Onset  . Stroke Mother   . Hypertension Mother   . Heart disease Mother   . Cancer Father   . Heart disease Father   . Heart attack Sister   . Diabetes Sister   . Heart attack Brother     Physical Examination: Vitals:   12/18/20 0700 12/18/20 0800  BP: (!) 152/48 (!) 149/60  Pulse: 72 80  Resp: 10 17  Temp:  98.7 F (37.1 C)  SpO2: 99% 100%     General: Patient is well developed, well nourished, calm, collected, and in no apparent distress.  Psychiatric: Patient is non-anxious.  Head:  Pupils equal, round, and reactive to light.  ENT:  Oral mucosa appears well hydrated.  Neck:   Supple.  Full range of motion.  Respiratory: Patient is breathing without any difficulty. On O2 via [place].  Extremities: No edema.  Vascular: Palpable pulses in dorsal pedal vessels.  Skin:   On exposed skin, there are no abnormal skin lesions.  NEUROLOGICAL:  General: In no acute distress.   Awake, alert, oriented to person.  Pupils equal round and reactive to light.  Facial tone is asymmetric with R facial weakness.  Tongue  protrusion is midline.  There is R pronator drift.  Speech is dysarthric.  Cannot repeat.  Names 1/3.  Follows simple but not complex commands.  Strength: Side Biceps Triceps Deltoid Interossei Grip Wrist Ext. Wrist Flex.  R 5 5 5 5 5 5 5   L 5 5 5 5 5 5 5    Side Iliopsoas Quads Hamstring PF DF EHL  R 5 5 5 5 5 5   L 5 5 5 5 5 5     Bilateral upper and lower extremity sensation is intact to light touch. Reflexes are 1+ and symmetric at the biceps, triceps, brachioradialis, patella and achilles. Hoffman's is absent.  Clonus is not present.  Toes are down-going.    Gait is untested.  Imaging: CTA Head and Neck 12/17/2020 IMPRESSION: CTA neck:  1. The common carotid and internal carotid arteries are patent within the neck. 2. Estimated 60% atherosclerotic stenosis at the origin of the right ICA. 3. Vertebral arteries patent within the neck. Prominent calcified plaque at the origin of the left vertebral artery with suspected moderate stenosis at this site. 4. Apparent 80% atherosclerotic stenosis within the proximal left subclavian artery. This stenosis could potentially be accentuated by blooming artifact from prominent calcified plaque at this site.  CTA head:  1. Distal left M2 MCA branch occlusion in the acute infarction territory. 2. High-grade stenosis within an adjacent distal left M2 MCA branch vessel. 3. High-grade stenoses within multiple proximal left M2 MCA branch vessels. 4. Moderate/severe stenoses within multiple proximal right M2 MCA branch vessels. 5. Severe stenosis of the supraclinoid left ICA. 6. Moderate/severe stenosis of the supraclinoid 7. Severe stenosis within the right anterior cerebral artery at the A2/A3 junction. 8. Moderate stenosis within the bilateral vertebral arteries at the level of the skull base and proximal V4 segments  CT perfusion head:  The perfusion software identifies a 14 mL core infarct within the left MCA vascular  territory. The perfusion software identifies a 28 mL region of hypoperfused parenchyma within the left MCA vascular territory. Reported mismatch volume: 14 mL.  ADDENDUM: Finding omitted from the impression:  2 mm vascular protrusion arising from the supraclinoid right ICA, which may reflect an infundibulum or aneurysm.  Electronically Signed: By: Kellie Simmering DO On: 12/17/2020 16:58  I have personally reviewed the images and agree with the above interpretation.  Labs: CBC Latest Ref Rng & Units 12/18/2020 12/17/2020 12/17/2020  WBC 4.0 - 10.5 K/uL 7.1 9.0 8.8  Hemoglobin 12.0 - 15.0 g/dL 9.8(L) 10.3(L) 10.3(L)  Hematocrit 36.0 - 46.0 % 30.2(L) 32.5(L) 32.9(L)  Platelets 150 - 400 K/uL 289 325 329       Assessment and Plan: [name] is a  pleasant 87 y.o. 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  This is an incidental finding unrelated to the presentation.  At her age and with the appearance of the lesion, it is unlikely that she will require intervention at any time.  I would not recommend additional imaging at this time given the unlikely nature that intervention would be recommended.  If this is an aneurysm, this represents a very low (1-3% in 5 years) risk for subarachnoid hemorrhage.  Given advanced age and other comorbidities, I recommend against intervention.  As she is treated for her stroke, I think permissive hypertension is safe.  There are no additional precautions necessary for this lesion.   [place] K. Izora Ribas MD, [place] [facility]

## 2020-12-18 NOTE — Progress Notes (Signed)
NAME:  [name] Day, MRN:  [id number], DOB:  [birth year], LOS: 1 ADMISSION DATE:  12/17/2020, CONSULTATION DATE:  12/17/2020 REFERRING MD:  Dr. Joni Fears, CHIEF COMPLAINT:  Weakness & AMS  History of Present Illness:  87 yo F presenting to the ED from home with her daughter with complaints of malaise, confusion & weakness.  According to the patient's daughter [name] patient was last seen in her normal state of health on 12/16/2020 around 1 PM when the patient's son-in-law was at her house assisting her with some outside work.  Per the patient's daughters she has been living at home alone caring for herself, ambulatory, crushing her own medications and self administering through her PEG tube.  [name]'s [name] noted patient had some difficulty " finding the right words" but that it was not serious enough to alarm him.  On 12/17/2020 around [place] the patient's daughter arrived to take her to an appointment and noted her mother was not herself.  The patient told [name], " something is not quite right".  [name] also noted her mother to be more "lethargic/slow", confused and not answering questions appropriately.  ED course: Initial vitals showed the patient slightly hypertensive but otherwise WNL.  Normothermic at 98.2, RR 18, HR 88, BP 162/65, SPO2 99% on room air. Around 1400 the patient became hypoxic at 80% on room air and was transitioned to 4 L nasal cannula.  Patient proceeded to have increased work of breathing and was ultimately placed on BiPAP.  Due to concerns for fluid volume overload secondary to known CHF history a nitroglycerin drip was initiated and 60 mg of Lasix given.  Chest x-ray demonstrated pulmonary edema and a small left pleural effusion with cardiomegaly.  CT head demonstrated 14 mL core infarct within the left MCA vascular territory.  Nitroglycerin drip was ultimately discontinued due to subsequent hypotension and Levophed drip initiated.  Stat neurology consult completed,  initial NIHSS 7. patient is out of the window for TPA administration and no other intervention warranted at this time.   12/18/20- patient is improved clinically.  She is being optimized for transfer to medical floor with plan for TRH pick up in am.   Pertinent  Medical History  pulmonary HTN Vertigo Meningioma Occlusive mesenteric ischemia CHG T2DM Sinus Arrhythmia Severe Esophageal Stricture s/p feeding tube placement HLD Cervical spine stenosis s/p C-spine fusion CKD HFrEF-NYHA class II (LVEF 20 to 25%, with severe MR, moderate to severe TR, mild AR) Iron deficiency anemia Significant Hospital Events: Including procedures, antibiotic start and stop dates in addition to other pertinent events   . 12/17/20 CODE STROKE called in ED, no TPA, admitted to SDU on BIPAP  Interim History / Subjective:  Patient resting on bed, alert and responsive.  NIHSS: 5 (patient initially scored 1 for RUE drift, which is no longer present & patient initially scored 2 for not answering both LOC questions-on my assessment she answered 1 question appropriately)  Patient does appear to have expressive and receptive aphasia, some of the assessment was challenging due to BiPAP & unsure if patient understood certain commands.  Objective   Blood pressure (!) 144/58, pulse 78, temperature 98.7 F (37.1 C), temperature source Oral, resp. rate 18, height 5\' 7"  (1.702 m), weight 56.7 kg, SpO2 99 %.    FiO2 (%):  [40 %] 40 %   Intake/Output Summary (Last 24 hours) at 12/18/2020 1147 Last data filed at 12/18/2020 0826 Gross per 24 hour  Intake 161.47 ml  Output 1670  ml  Net -1508.53 ml   Filed Weights   12/17/20 1227  Weight: 56.7 kg    Examination: General: Adult female, critically ill, lying in bed, NAD HEENT: MM pink/moist, anicteric, atraumatic, neck supple Neuro: A&O x 2-3, able to follow commands, PERRL 3+, MAE CV: s1s2 RRR, NSR on monitor, no r/m/g Pulm: Regular, non labored on BiPAP at 40%,  breath sounds diminished throughout GI: soft, rounded, non tender, bs x 4 GU: foley in place with clear yellow urine Skin: no rashes/lesions noted Extremities: warm/dry, pulses + 2 R/P, trace edema noted BLE  Labs/imaging personally reviewed   12/17/2020 CT head without contrast >> 4.9 x 3.3 x 5.4 cm acute subacute appearing cortical and subcortical left MCA territory infarct within the left frontoparietal and left temporal lobes as well as left insula no evidence of hemorrhagic conversion no significant mass-effect.  12/17/2020 CT angio head/neck/cerebral perfusion >> distal left M2 MCA branch occlusion, severe stenosis of the supraclinoid L ICA, 14 mL core infarct within the left MCA with 28 mL region of hyperperfused parenchyma within the L MCA, reported mismatch volume 14 mL. 60% stenosis of R ICA, 80% stenosis of L subclavian artery  I, [name], AGACNP-BC, personally viewed and interpreted this ECG. EKG Interpretation Date:  12/17/2020 EKG Time:  12:10 PM Rate:  89 Rhythm: NSR QRS Axis:  Normal Intervals: QT prolongation ST/T Wave abnormalities: ST and T wave abnormalities, possible inferior lateral ischemia Narrative Interpretation: NSR with ST and T wave abnormalities & QT prolongation  Na+/ K+: 137/ 4.4 BUN/Cr.: 43/ 0.88 Serum CO2/ AG: 25/7  Hgb: 10.3 Troponin: 16 ~92 BNP: 92.2 WBC/ TMAX: 8.8/afebrile  VBG: 7.34/52/43/28.1 CXR 12/17/2020 @17 :20: Improved aeration compared to CXR at 14:30.  Cardiomegaly, interstitial edema and small left pleural effusion.  Resolved Hospital Problem list     Assessment & Plan:  Acute ischemic stroke PMHx: HLD, T2 DM, meningioma Patient independent and ambulatory at baseline, LKW- 12/16/20 around 1 PM. head CT showed acute left MCA stroke, not eligible for TPA or surgical intervention. - ASA & plavix load per neuro recommendations, followed by daily dosing -Every hour neurochecks x 5 (till midnight), then every 2 hour x 8,  and every 4 hour -Follow-up MRI brain  -Echocardiogram ordered -Permissive hypertension to 220/120, if needed plan to normalize BP 12/18/2020 -PT/OT/SLP consults -Continuous cardiac monitoring -Follow-up hemoglobin A1c & lipid panel with a.m. labs -Neurology consulted, appreciate input-no interventions  Acute Hypoxic Respiratory Failure secondary to flash pulmonary edema PMHx: HFrEF (LVEF 20 to 25%) Patient became hypoxic with SPO2 of 80% on room air, does not wear chronic oxygen at home.  Chest x-ray showed pulmonary edema, 60 mg of Lasix given with nitroglycerin drip and patient had some transitory hypotension.  Repeat chest x-ray improved aeration noted - Continue BIPAP overnight, wean FiO2 as tolerated - Supplemental O2 to maintain SpO2 > 90% - Intermittent chest x-ray & ABG PRN - Ensure adequate pulmonary hygiene  - bronchodilators PRN -Careful diuresis as tolerated  Hypertension Patient initially required nitroglycerin drip for hypertension and flash pulmonary edema.  Patient then had transitory hypotension requiring Levophed drip which was eventually weaned off. -Permissive hypertension as noted above -Consider restarting home regimen as needed on 12/18/2020  Hyperlipidemia Patient not currently on statin therapy -Follow-up lipid panel in a.m. -Initiate statin once patient is stabilized  Type 2 diabetes mellitus -Every 4 blood glucose monitoring with SSI coverage -Follow ICU hypo-/hyper-glycemia protocol -Follow-up hemoglobin A1c with a.m. labs  Best practice (evaluated  daily)  Diet:  NPO Pain/Anxiety/Delirium protocol (if indicated): No VAP protocol (if indicated): Not indicated DVT prophylaxis: SCD GI prophylaxis: PPI Glucose control:  SSI Yes Central venous access:  N/A Arterial line:  N/A Foley:  Yes, and it is still needed Mobility:  bed rest  PT consulted: Yes Last date of multidisciplinary goals of care discussion 12/17/2020 with daughters [place] and  [place]. Code Status:  full code Disposition: Stepdown  Labs   CBC: Recent Labs  Lab 12/17/20 1223 12/18/20 0444  WBC 9.0  8.8 7.1  NEUTROABS 7.1  --   HGB 10.3*  10.3* 9.8*  HCT 32.5*  32.9* 30.2*  MCV 91.3  91.1 91.0  PLT 325  329 948    Basic Metabolic Panel: Recent Labs  Lab 12/17/20 1223 12/18/20 0444  NA 137 140  K 4.4 4.6  CL 105 105  CO2 25 27  GLUCOSE 188* 113*  BUN 43* 42*  CREATININE 0.88 1.11*  CALCIUM 8.8* 8.3*  MG  --  2.2  PHOS  --  4.0   GFR: Estimated Creatinine Clearance: 30.8 mL/min (A) (by C-G formula based on SCr of 1.11 mg/dL (H)). Recent Labs  Lab 12/17/20 1223 12/18/20 0444  WBC 9.0  8.8 7.1    Liver Function Tests: Recent Labs  Lab 12/17/20 1223  AST 19  ALT 15  ALKPHOS 56  BILITOT 1.0  PROT 6.9  ALBUMIN 3.8   No results for input(s): LIPASE, AMYLASE in the last 168 hours. Recent Labs  Lab 12/17/20 1434  AMMONIA 19    ABG    Component Value Date/Time   HCO3 28.1 (H) 12/17/2020 1701   O2SAT 75.3 12/17/2020 1701     Coagulation Profile: Recent Labs  Lab 12/17/20 1223  INR 1.0    Cardiac Enzymes: No results for input(s): CKTOTAL, CKMB, CKMBINDEX, TROPONINI in the last 168 hours.  HbA1C: Hgb A1c MFr Bld  Date/Time Value Ref Range Status  12/18/2020 04:44 AM 6.9 (H) 4.8 - 5.6 % Final    Comment:    (NOTE) Pre diabetes:          5.7%-6.4%  Diabetes:              >6.4%  Glycemic control for   <7.0% adults with diabetes   11/23/2019 03:30 PM 6.9 (H) 4.8 - 5.6 % Final    Comment:    (NOTE) Pre diabetes:          5.7%-6.4% Diabetes:              >6.4% Glycemic control for   <7.0% adults with diabetes     CBG: Recent Labs  Lab 12/17/20 1213 12/17/20 2357 12/18/20 0515 12/18/20 0807 12/18/20 1058  GLUCAP 178* 85 102* 99 123*    Review of Systems:   UTA-patient unable to provide history of precipitating events. Past Medical History:  She,  has a past medical history of Anemia, Aortic  valve sclerosis, Arrhythmia, Arthritis, Basal cell carcinoma of skin, Brain tumor ([place]), Brain tumor ([place]), Cervical spine disease, CHF (congestive heart failure) ([place]), Diabetes mellitus without complication ([place]), Dysrhythmia, Esophageal stricture, Esophageal ulcer without bleeding, Gastrostomy tube dependent ([place]), GERD (gastroesophageal reflux disease), History of kidney stones, Hyperlipemia, Hypertension, Kidney stones, Leaky heart valve, Meningioma ([facility]), Occlusive mesenteric ischemia ([place]), Pulmonary hypertension ([place]), RLS (restless legs syndrome), and Vertigo.   Surgical History:   Past Surgical History:  Procedure Laterality Date  . ABDOMINAL HYSTERECTOMY    . APPENDECTOMY    . ARTHROGRAM  KNEE Left   . BACK SURGERY     CERVIVAL NECK FUSION  . CATARACT EXTRACTION W/PHACO Left 11/11/2018   Procedure: CATARACT EXTRACTION PHACO AND INTRAOCULAR LENS PLACEMENT (IOC) LEFT, DIABETIC;  Surgeon: Birder Robson, MD;  Location: [facility] ORS;  Service: Ophthalmology;  Laterality: Left;  [place]  00:41 CDE 6.41 Fluid pack lot # [id number]  . COLONOSCOPY WITH PROPOFOL N/A 06/20/2017   Procedure: COLONOSCOPY WITH PROPOFOL;  Surgeon: Lollie Sails, MD;  Location: [facility] ENDOSCOPY;  Service: Endoscopy;  Laterality: N/A;  . ESOPHAGOGASTRODUODENOSCOPY (EGD) WITH PROPOFOL N/A 03/14/2015   Procedure: ESOPHAGOGASTRODUODENOSCOPY (EGD) WITH PROPOFOL;  Surgeon: Josefine Class, MD;  Location: [facility] ENDOSCOPY;  Service: Endoscopy;  Laterality: N/A;  . ESOPHAGOGASTRODUODENOSCOPY (EGD) WITH PROPOFOL N/A 03/28/2017   Procedure: ESOPHAGOGASTRODUODENOSCOPY (EGD) WITH PROPOFOL;  Surgeon: Lollie Sails, MD;  Location: [facility] ENDOSCOPY;  Service: Endoscopy;  Laterality: N/A;  . ESOPHAGOGASTRODUODENOSCOPY (EGD) WITH PROPOFOL N/A 06/20/2017   Procedure: ESOPHAGOGASTRODUODENOSCOPY (EGD) WITH PROPOFOL;  Surgeon: Lollie Sails, MD;  Location: [facility] ENDOSCOPY;  Service: Endoscopy;  Laterality: N/A;  .  ESOPHAGOGASTRODUODENOSCOPY (EGD) WITH PROPOFOL N/A 10/21/2017   Procedure: ESOPHAGOGASTRODUODENOSCOPY (EGD) WITH PROPOFOL;  Surgeon: Lollie Sails, MD;  Location: [facility] ENDOSCOPY;  Service: Endoscopy;  Laterality: N/A;  . ESOPHAGOGASTRODUODENOSCOPY (EGD) WITH PROPOFOL N/A 04/15/2018   Procedure: ESOPHAGOGASTRODUODENOSCOPY (EGD) WITH PROPOFOL;  Surgeon: Lollie Sails, MD;  Location: [facility] ENDOSCOPY;  Service: Endoscopy;  Laterality: N/A;  . ESOPHAGUS SURGERY     CLOSURE  . EYE SURGERY    . HYSTERECTOMY ABDOMINAL WITH SALPINGECTOMY    . IR GASTROSTOMY TUBE MOD SED  06/11/2018  . IR GASTROSTOMY TUBE REMOVAL  03/25/2019  . IR REPLACE G-TUBE SIMPLE WO FLUORO  10/19/2020  . PERIPHERAL VASCULAR CATHETERIZATION N/A 01/23/2016   Procedure: Visceral Venography;  Surgeon: Algernon Huxley, MD;  Location: [address] CV LAB;  Service: Cardiovascular;  Laterality: N/A;  . PERIPHERAL VASCULAR CATHETERIZATION  01/23/2016   Procedure: Peripheral Vascular Intervention;  Surgeon: Algernon Huxley, MD;  Location: [place] CV LAB;  Service: Cardiovascular;;  . UPPER ESOPHAGEAL ENDOSCOPIC ULTRASOUND (EUS) N/A 12/05/2017   Procedure: UPPER ESOPHAGEAL ENDOSCOPIC ULTRASOUND (EUS);  Surgeon: Jola Schmidt, MD;  Location: [facility] ENDOSCOPY;  Service: Endoscopy;  Laterality: N/A;  . VISCERAL ANGIOGRAPHY N/A 03/18/2017   Procedure: Visceral Angiography;  Surgeon: Algernon Huxley, MD;  Location: [place] CV LAB;  Service: Cardiovascular;  Laterality: N/A;  . VISCERAL ARTERY INTERVENTION N/A 03/18/2017   Procedure: Visceral Artery Intervention;  Surgeon: Algernon Huxley, MD;  Location: [place] CV LAB;  Service: Cardiovascular;  Laterality: N/A;     Social History:   reports that she has never smoked. She has never used smokeless tobacco. She reports that she does not drink alcohol and does not use drugs.   Family History:  Her family history includes Cancer in her father; Diabetes in her sister; Heart attack in her brother  and sister; Heart disease in her father and mother; Hypertension in her mother; Stroke in her mother.   Allergies Allergies  Allergen Reactions  . Elemental Sulfur Diarrhea and Nausea And Vomiting  . Gabapentin Swelling  . Lipitor [Atorvastatin] Other (See Comments)    Muscle aches  . Mevacor [Lovastatin] Other (See Comments)    Muscle aches  . Milk-Related Compounds Other (See Comments)    Large quantities cause headaches   . Septra [Sulfamethoxazole-Trimethoprim] Other (See Comments)    Unknown  . Statins Other (See Comments)    Muscle pain  .  Zocor [Simvastatin] Other (See Comments)    Muscle aches     Home Medications  Prior to Admission medications   Medication Sig Start Date End Date Taking? Authorizing Provider  acetaminophen (TYLENOL) 160 MG/5ML solution Place 20.3 mLs (650 mg total) into feeding tube every 6 (six) hours as needed for mild pain. 06/13/18   Demetrios Loll, MD  allopurinol (ZYLOPRIM) 100 MG tablet Place 100 mg into feeding tube daily.    [provider]  Amino Acids-Protein Hydrolys (FEEDING SUPPLEMENT, PRO-STAT SUGAR FREE 64,) LIQD Place 30 mLs into feeding tube daily. 06/13/18   Demetrios Loll, MD  carvedilol (COREG) 6.25 MG tablet Take 6.25 mg by mouth 2 (two) times daily with a meal.    [provider]  clopidogrel (PLAVIX) 75 MG tablet GIVE 1 TABLET VIA FEEDING TUBE EVERY DAY AS DIRECTED 09/14/20   Algernon Huxley, MD  dextromethorphan-guaiFENesin [facility] DM) 30-600 MG 12hr tablet Take 1 tablet by mouth 2 (two) times daily.    [provider]  diclofenac Sodium (VOLTAREN) 1 % GEL Apply topically 4 (four) times daily.    [provider]  diltiazem (CARDIZEM) 120 MG tablet Take 120 mg by mouth daily.  08/16/19   [provider]  furosemide (LASIX) 10 MG/ML solution Place 60 mg into feeding tube daily.     [provider]  glipiZIDE (GLUCOTROL) 5 MG tablet Place 1 tablet (5 mg total) into feeding tube daily before  breakfast. 06/13/18   Demetrios Loll, MD  hydrALAZINE (APRESOLINE) 50 MG tablet Place 1 tablet (50 mg total) into feeding tube 2 (two) times daily. 06/13/18   Demetrios Loll, MD  Hypromellose 0.2 % SOLN Place 1 drop into both eyes 3 (three) times daily as needed (dry eyes).     [provider]  lisinopril (ZESTRIL) 40 MG tablet Take 20 mg by mouth daily.     [provider]  loperamide (IMODIUM) 1 MG/5ML solution Take 3 mg by mouth as needed for diarrhea or loose stools.    [provider]  meclizine (ANTIVERT) 25 MG tablet Place 25 mg into feeding tube as needed for dizziness.     [provider]  Multiple Vitamin (MULTIVITAMIN) LIQD Place 15 mLs into feeding tube daily. 06/13/18   Demetrios Loll, MD  Nutritional Supplements (FEEDING SUPPLEMENT, JEVITY 1.5 CAL/FIBER,) LIQD Place 237 mLs into feeding tube 4 (four) times daily. 06/25/18   Dustin Flock, MD  pantoprazole sodium (PROTONIX) 40 mg/20 mL PACK Place 20 mLs (40 mg total) into feeding tube 2 (two) times daily. Patient not taking: No sig reported 06/25/18   Dustin Flock, MD  Pedialyte (PEDIALYTE) SOLN Take by mouth in the morning, at noon, in the evening, and at bedtime.    [provider]  rOPINIRole (REQUIP) 2 MG tablet Place 1 tablet (2 mg total) into feeding tube 2 (two) times daily. 06/13/18   Demetrios Loll, MD  spironolactone (ALDACTONE) 25 MG tablet Take 25 mg by mouth daily. 12/04/19   [provider]  vitamin C (VITAMIN C) 250 MG tablet Place 1 tablet (250 mg total) into feeding tube 2 (two) times daily. 06/13/18   Demetrios Loll, MD  Water For Irrigation, Sterile (FREE WATER) SOLN Place 30 mLs into feeding tube every 4 (four) hours. 06/25/18   Dustin Flock, MD     Critical care provider statement:    Critical care time (minutes):  33   Critical care time was exclusive of:  Separately billable procedures and  treating other patients   Critical care was necessary to treat or prevent imminent or   life-threatening deterioration of the following conditions:  Acute CVA, multiple comorbid conditions   Critical care was time spent personally by me on the following  activities:  Development of treatment plan with patient or surrogate,  discussions with consultants, evaluation of patient's response to  treatment, examination of patient, obtaining history from patient or  surrogate, ordering and performing treatments and interventions, ordering  and review of laboratory studies and re-evaluation of patient's condition   I assumed direction of critical care for this patient from another  provider in my specialty: no   .   Ottie Glazier, M.D.  Pulmonary & [place]

## 2020-12-19 DIAGNOSIS — E785 Hyperlipidemia, unspecified: Secondary | ICD-10-CM

## 2020-12-19 DIAGNOSIS — E1169 Type 2 diabetes mellitus with other specified complication: Secondary | ICD-10-CM

## 2020-12-19 DIAGNOSIS — R531 Weakness: Secondary | ICD-10-CM

## 2020-12-19 DIAGNOSIS — G2581 Restless legs syndrome: Secondary | ICD-10-CM

## 2020-12-19 DIAGNOSIS — I63512 Cerebral infarction due to unspecified occlusion or stenosis of left middle cerebral artery: Secondary | ICD-10-CM

## 2020-12-19 DIAGNOSIS — J9601 Acute respiratory failure with hypoxia: Secondary | ICD-10-CM

## 2020-12-19 LAB — BASIC METABOLIC PANEL
Anion gap: 8 (ref 5–15)
BUN: 42 mg/dL — ABNORMAL HIGH (ref 8–23)
CO2: 24 mmol/L (ref 22–32)
Calcium: 8.3 mg/dL — ABNORMAL LOW (ref 8.9–10.3)
Chloride: 109 mmol/L (ref 98–111)
Creatinine, Ser: 1.11 mg/dL — ABNORMAL HIGH (ref 0.44–1.00)
GFR, Estimated: 48 mL/min — ABNORMAL LOW (ref >60)
Glucose, Bld: 130 mg/dL — ABNORMAL HIGH (ref 70–99)
Potassium: 4.1 mmol/L (ref 3.5–5.1)
Sodium: 141 mmol/L (ref 135–145)

## 2020-12-19 LAB — ECHOCARDIOGRAM COMPLETE
AR max vel: 0.59 cm2
AV Area VTI: 0.56 cm2
AV Area mean vel: 0.55 cm2
AV Mean grad: 16.5 mmHg
AV Peak grad: 29.4 mmHg
Ao pk vel: 2.71 m/s
Area-P 1/2: 2.11 cm2
Height: 67 in
MV M vel: 5.6 m/s
MV Peak grad: 125.4 mmHg
P 1/2 time: 668 msec
Radius: 0.6 cm
S' Lateral: 4.04 cm
Single Plane A4C EF: 38.5 %
Weight: 2000 oz

## 2020-12-19 LAB — CBC
HCT: 31.2 % — ABNORMAL LOW (ref 36.0–46.0)
Hemoglobin: 10.1 g/dL — ABNORMAL LOW (ref 12.0–15.0)
MCH: 29.5 pg (ref 26.0–34.0)
MCHC: 32.4 g/dL (ref 30.0–36.0)
MCV: 91.2 fL (ref 80.0–100.0)
Platelets: 282 10*3/uL (ref 150–400)
RBC: 3.42 MIL/uL — ABNORMAL LOW (ref 3.87–5.11)
RDW: 14.7 % (ref 11.5–15.5)
WBC: 7.3 10*3/uL (ref 4.0–10.5)
nRBC: 0 % (ref 0.0–0.2)

## 2020-12-19 LAB — GLUCOSE, CAPILLARY
Glucose-Capillary: 174 mg/dL — ABNORMAL HIGH (ref 70–99)
Glucose-Capillary: 71 mg/dL (ref 70–99)
Glucose-Capillary: 62 mg/dL — ABNORMAL LOW (ref 70–99)
Glucose-Capillary: 145 mg/dL — ABNORMAL HIGH (ref 70–99)
Glucose-Capillary: 102 mg/dL — ABNORMAL HIGH (ref 70–99)
Glucose-Capillary: 105 mg/dL — ABNORMAL HIGH (ref 70–99)
Glucose-Capillary: 123 mg/dL — ABNORMAL HIGH (ref 70–99)
Glucose-Capillary: 212 mg/dL — ABNORMAL HIGH (ref 70–99)

## 2020-12-19 LAB — PHOSPHORUS: Phosphorus: 3.5 mg/dL (ref 2.5–4.6)

## 2020-12-19 LAB — MAGNESIUM: Magnesium: 2.7 mg/dL — ABNORMAL HIGH (ref 1.7–2.4)

## 2020-12-19 MED ORDER — FUROSEMIDE 20 MG PO TABS
10.0000 mg | ORAL_TABLET | Freq: Every day | ORAL | Status: DC
  Administered 2020-12-20 – 2020-12-22 (×3): 10 mg
  Filled 2020-12-19 (×3): qty 1

## 2020-12-19 MED ORDER — VITAL HIGH PROTEIN PO LIQD: 1000.0000 mL | ORAL | Status: DC

## 2020-12-19 MED ORDER — INSULIN ASPART 100 UNIT/ML ~~LOC~~ SOLN
0.0000 [IU] | Freq: Three times a day (TID) | SUBCUTANEOUS | Status: DC
  Administered 2020-12-20 (×2): 5 [IU] via SUBCUTANEOUS
  Filled 2020-12-19 (×2): qty 1

## 2020-12-19 MED ORDER — JEVITY 1.2 CAL PO LIQD
1000.0000 mL | ORAL | Status: DC
  Administered 2020-12-19: 25 mL/h

## 2020-12-19 MED ORDER — FUROSEMIDE 20 MG PO TABS: 10.0000 mg | ORAL_TABLET | Freq: Every day | ORAL | Status: DC

## 2020-12-19 MED ORDER — INSULIN ASPART 100 UNIT/ML ~~LOC~~ SOLN: 0.0000 [IU] | Freq: Every day | SUBCUTANEOUS | Status: DC

## 2020-12-19 MED ORDER — FREE WATER
100.0000 mL | Freq: Four times a day (QID) | Status: DC
  Administered 2020-12-19 – 2020-12-20 (×3): 100 mL

## 2020-12-19 MED ORDER — ROPINIROLE HCL 1 MG PO TABS
2.0000 mg | ORAL_TABLET | Freq: Two times a day (BID) | ORAL | Status: DC
  Administered 2020-12-19 – 2020-12-22 (×7): 2 mg via JEJUNOSTOMY
  Filled 2020-12-19 (×7): qty 2

## 2020-12-19 MED ORDER — DEXTROSE 50 % IV SOLN
INTRAVENOUS | Status: AC
  Administered 2020-12-19: 12.5 g via INTRAVENOUS
  Filled 2020-12-19: qty 50

## 2020-12-19 MED ORDER — PROSOURCE TF PO LIQD
45.0000 mL | Freq: Two times a day (BID) | ORAL | Status: DC
  Filled 2020-12-19: qty 45

## 2020-12-19 MED ORDER — POLYETHYLENE GLYCOL 3350 17 G PO PACK: 17.0000 g | PACK | Freq: Every day | ORAL | Status: DC | PRN

## 2020-12-19 MED ORDER — DEXTROSE 50 % IV SOLN: 12.5000 g | INTRAVENOUS | Status: AC

## 2020-12-19 MED ORDER — CARVEDILOL 6.25 MG PO TABS
6.2500 mg | ORAL_TABLET | Freq: Two times a day (BID) | ORAL | Status: DC
  Administered 2020-12-19 – 2020-12-20 (×2): 6.25 mg
  Filled 2020-12-19 (×2): qty 1

## 2020-12-19 NOTE — Plan of Care (Signed)
  Problem: Education: Goal: Knowledge of secondary prevention will improve Outcome: Progressing   Problem: Education: Goal: Knowledge of patient specific risk factors addressed and post discharge goals established will improve Outcome: Progressing   

## 2020-12-19 NOTE — Progress Notes (Addendum)
Initial Nutrition Assessment  DOCUMENTATION CODES:   Not applicable  INTERVENTION:   -Initiate Jevity 1.2 @ 25 ml/hr via PEG and increase by 10 ml every 4 hours to goal rate of 55 ml/hr.   100 ml free water flush every 6 hours  Tube feeding regimen provides 1584 kcal (100% of needs), 73 grams of protein, and 1065 ml of H2O. Total free water: 1465 ml daily   NUTRITION DIAGNOSIS:   Moderate Malnutrition related to chronic illness (esophageal stricture) as evidenced by energy intake < or equal to 75% for > or equal to 1 month,mild fat depletion,moderate fat depletion,mild muscle depletion,moderate muscle depletion.  GOAL:   Patient will meet greater than or equal to 90% of their needs  MONITOR:   Labs,Weight trends,Skin,I & O's  REASON FOR ASSESSMENT:   Consult Enteral/tube feeding initiation and management  ASSESSMENT:   87 yo F presenting to the ED from home with her daughter with complaints of malaise, confusion & weakness.  According to the patient's daughter [name] patient was last seen in her normal state of health on 12/16/2020 around 1 PM when the patient's son-in-law was at her house assisting her with some outside work.  Per the patient's daughters she has been living at home alone caring for herself, ambulatory, crushing her own medications and self administering through her PEG tube  Pt admitted with acute ischemic stroke.   Reviewed I/O's: -722 ml x 24 hours and -2.3 L since admission  UOP: 765 ml x 24 hours  Spoke with pt daughter at bedside, who reports pt has not received TF in the past 3 days. She is eager to get feedings started.   PTA, pt was receiving bolus feedings of 237 ml Jevity 1.2 TID (regimen provides 855 kcals, 40 grams protein, and 573 ml H2O, which meets 59% of estimated kcal needs and 62% of estimated protein needs). Pt has had PEG tube for about 3 years and daughter that pt has not been tolerating bolus feedings well. She was initially placed  on Osmolite, but tolerated these feeds poorly. She was switched to Jevity, however, still have "explosive diarrhea" with feedings. Pt does takes imodium, which helps, but this has not resolved the problem. Pt daughter requesting to try continuous feedings while here to help promote tolerance. She is NPO, however, pt does pleasure feedings at home.   Pt has not had any weight loss per daughter. He UBW is around 118-120#. Per wt hx, wt has been stable.   Per RN, plan to start TF ASAP due to hypoglycemia.   Labs reviewed: CBGS: 62-145.   NUTRITION - FOCUSED PHYSICAL EXAM:  Flowsheet Row Most Recent Value  Orbital Region Moderate depletion  Upper Arm Region Moderate depletion  Thoracic and Lumbar Region No depletion  Buccal Region No depletion  Temple Region Mild depletion  Clavicle Bone Region Mild depletion  Clavicle and Acromion Bone Region Mild depletion  Scapular Bone Region Mild depletion  Dorsal Hand No depletion  Patellar Region Mild depletion  Anterior Thigh Region Mild depletion  Posterior Calf Region Mild depletion  Edema (RD Assessment) None  Hair Reviewed  Eyes Reviewed  Mouth Reviewed  Skin Reviewed  Nails Reviewed       Diet Order:   Diet Order            Diet NPO time specified  Diet effective now                 EDUCATION NEEDS:   Education  needs have been addressed  Skin:  Skin Assessment: Reviewed RN Assessment  Last BM:  Unknown  Height:   Ht Readings from Last 1 Encounters:  12/17/20 5\' 7"  (1.702 m)    Weight:   Wt Readings from Last 1 Encounters:  12/19/20 55.1 kg    Ideal Body Weight:  61.4 kg  BMI:  Body mass index is 19.03 kg/m.  Estimated Nutritional Needs:   Kcal:  1450-1650  Protein:  65-80 grams  Fluid:  > 1.4 L    Loistine Chance, RD, LDN, [place] Registered Dietitian II Certified Diabetes Care and Education Specialist Please refer to [facility] for RD and/or RD on-call/weekend/after hours pager

## 2020-12-19 NOTE — Progress Notes (Signed)
Patient ID: [name], female   DOB: 04/05/1929, 87 y.o.   MRN: [id number] [facility] PROGRESS NOTE  [name] QJF:[id number] DOB: 16-Apr-1929 DOA: 12/17/2020 PCP: [name], MD  HPI/Subjective: Patient feels okay.  States that she came in with weakness on the right side.  She was diagnosed with stroke.  She also had acute respiratory failure and was initially on BiPAP.  Currently on room air.  Objective: Vitals:   12/19/20 1100 12/19/20 1200  BP: (!) 168/69 (!) 170/64  Pulse: 76 78  Resp: 12   Temp:    SpO2: 95% 96%    Intake/Output Summary (Last 24 hours) at 12/19/2020 1345 Last data filed at 12/19/2020 0600 Gross per 24 hour  Intake -  Output 700 ml  Net -700 ml   Filed Weights   12/17/20 1227 12/19/20 0423  Weight: 56.7 kg 55.1 kg    ROS: Review of Systems  Respiratory: Negative for cough and shortness of breath.   Cardiovascular: Negative for chest pain.  Gastrointestinal: Negative for abdominal pain, nausea and vomiting.  Neurological: Positive for speech change and focal weakness.   Exam: Physical Exam HENT:     Head: Normocephalic.     Mouth/Throat:     Pharynx: No oropharyngeal exudate.  Eyes:     General: Lids are normal.     Extraocular Movements: Extraocular movements intact.     Conjunctiva/sclera: Conjunctivae normal.     Pupils: Pupils are equal, round, and reactive to light.  Cardiovascular:     Rate and Rhythm: Normal rate and regular rhythm.     Heart sounds: Normal heart sounds, S1 normal and S2 normal.  Pulmonary:     Breath sounds: Normal breath sounds. No decreased breath sounds, wheezing, rhonchi or rales.  Abdominal:     Palpations: Abdomen is soft.     Tenderness: There is no abdominal tenderness.  Musculoskeletal:     Right ankle: No swelling.     Left ankle: No swelling.  Skin:    General: Skin is warm.     Findings: No rash.  Neurological:     Mental Status: She is alert.     Comments: Power 4+ out of  5 on the right side 5 out of 5 on the left side.  Some difficulty with speech.       Data Reviewed: Basic Metabolic Panel: Recent Labs  Lab 12/17/20 1223 12/18/20 0444 12/19/20 0520  NA 137 140 141  K 4.4 4.6 4.1  CL 105 105 109  CO2 25 27 24   GLUCOSE 188* 113* 130*  BUN 43* 42* 42*  CREATININE 0.88 1.11* 1.11*  CALCIUM 8.8* 8.3* 8.3*  MG  --  2.2 2.7*  PHOS  --  4.0 3.5   Liver Function Tests: Recent Labs  Lab 12/17/20 1223  AST 19  ALT 15  ALKPHOS 56  BILITOT 1.0  PROT 6.9  ALBUMIN 3.8    Recent Labs  Lab 12/17/20 1434  AMMONIA 19   CBC: Recent Labs  Lab 12/17/20 1223 12/18/20 0444 12/19/20 0520  WBC 9.0  8.8 7.1 7.3  NEUTROABS 7.1  --   --   HGB 10.3*  10.3* 9.8* 10.1*  HCT 32.5*  32.9* 30.2* 31.2*  MCV 91.3  91.1 91.0 91.2  PLT 325  329 289 282   BNP (last 3 results) Recent Labs    12/17/20 1223  BNP 982.2*     CBG: Recent Labs  Lab 12/19/20 0128 12/19/20  0413 12/19/20 0441 12/19/20 0800 12/19/20 1119  GLUCAP 71 62* 145* 102* 105*    Recent Results (from the past 240 hour(s))  Culture, blood (routine x 2)     Status: None (Preliminary result)   Collection Time: 12/17/20  5:22 PM   Specimen: BLOOD  Result Value Ref Range Status   Specimen Description BLOOD BLOOD LEFT HAND  Final   Special Requests   Final    BOTTLES DRAWN AEROBIC AND ANAEROBIC Blood Culture results may not be optimal due to an inadequate volume of blood received in culture bottles   Culture   Final    NO GROWTH 2 DAYS Performed at [facility], [address], [place], [place] [address]    Report Status PENDING  Incomplete  Culture, blood (routine x 2)     Status: None (Preliminary result)   Collection Time: 12/17/20  5:33 PM   Specimen: BLOOD  Result Value Ref Range Status   Specimen Description BLOOD BLOOD LEFT ARM  Final   Special Requests AEROBIC BOTTLE ONLY Blood Culture adequate volume  Final   Culture   Final    NO GROWTH 2  DAYS Performed at [facility], [address], [place], [place] [address]    Report Status PENDING  Incomplete  Resp Panel by RT-PCR (Flu A&B, Covid) Nasopharyngeal Swab     Status: None   Collection Time: 12/17/20  8:17 PM   Specimen: Nasopharyngeal Swab; Nasopharyngeal(NP) swabs in vial transport medium  Result Value Ref Range Status   SARS Coronavirus 2 by RT PCR NEGATIVE NEGATIVE Final    Comment: (NOTE) SARS-CoV-2 target nucleic acids are NOT DETECTED.  The SARS-CoV-2 RNA is generally detectable in upper respiratory specimens during the acute phase of infection. The lowest concentration of SARS-CoV-2 viral copies this assay can detect is 138 copies/mL. A negative result does not preclude SARS-Cov-2 infection and should not be used as the sole basis for treatment or other patient management decisions. A negative result may occur with  improper specimen collection/handling, submission of specimen other than nasopharyngeal swab, presence of viral mutation(s) within the areas targeted by this assay, and inadequate number of viral copies(<138 copies/mL). A negative result must be combined with clinical observations, patient history, and epidemiological information. The expected result is Negative.  Fact Sheet for Patients:  EntrepreneurPulse.com.au  Fact Sheet for Healthcare Providers:  IncredibleEmployment.be  This test is no t yet approved or cleared by the [place] FDA and  has been authorized for detection and/or diagnosis of SARS-CoV-2 by FDA under an Emergency Use Authorization (EUA). This EUA will remain  in effect (meaning this test can be used) for the duration of the COVID-19 declaration under Section 564(b)(1) of the Act, 21 U.S.C.section 360bbb-3(b)(1), unless the authorization is terminated  or revoked sooner.       Influenza A by PCR NEGATIVE NEGATIVE Final   Influenza B by PCR NEGATIVE NEGATIVE Final     Comment: (NOTE) The Xpert Xpress SARS-CoV-2/FLU/RSV plus assay is intended as an aid in the diagnosis of influenza from Nasopharyngeal swab specimens and should not be used as a sole basis for treatment. Nasal washings and aspirates are unacceptable for Xpert Xpress SARS-CoV-2/FLU/RSV testing.  Fact Sheet for Patients: EntrepreneurPulse.com.au  Fact Sheet for Healthcare Providers: IncredibleEmployment.be  This test is not yet approved or cleared by the [place] FDA and has been authorized for detection and/or diagnosis of SARS-CoV-2 by FDA under an Emergency Use Authorization (EUA). This EUA will remain  in effect (meaning this test can be used) for the duration of the COVID-19 declaration under Section 564(b)(1) of the Act, 21 U.S.C. section 360bbb-3(b)(1), unless the authorization is terminated or revoked.  Performed at [facility], [address] [place], [place] [address]      Studies: CT Angio Head W or Wo Contrast  Addendum Date: 12/17/2020   ADDENDUM REPORT: 12/17/2020 19:09 ADDENDUM: Finding omitted from the impression: 2 mm vascular protrusion arising from the supraclinoid right ICA, which may reflect an infundibulum or aneurysm. These results will be called to the ordering clinician or representative by the Radiologist Assistant, and communication documented in the PACS or [employer]. Electronically Signed   By: Kellie Simmering DO   On: 12/17/2020 19:09   Result Date: 12/17/2020 CLINICAL DATA:  Stroke/TIA, assess intracranial arteries. EXAM: CT ANGIOGRAPHY HEAD AND NECK CT PERFUSION BRAIN TECHNIQUE: Multidetector CT imaging of the head and neck was performed using the standard protocol during bolus administration of intravenous contrast. Multiplanar CT image reconstructions and MIPs were obtained to evaluate the vascular anatomy. Carotid stenosis measurements (when applicable) are obtained utilizing NASCET criteria, using  the distal internal carotid diameter as the denominator. Multiphase CT imaging of the brain was performed following IV bolus contrast injection. Subsequent parametric perfusion maps were calculated using RAPID software. CONTRAST:  17mL OMNIPAQUE IOHEXOL 350 MG/ML SOLN COMPARISON:  Noncontrast head CT performed earlier today 12/17/2020. Brain MRI 06/08/2008. FINDINGS: CTA NECK FINDINGS Aortic arch: Standard aortic branching. Atherosclerotic plaque within the visualized aortic arch and proximal major branch vessels of the neck. No hemodynamically significant innominate or proximal right subclavian artery stenosis. Prominent soft and calcified plaque within the proximal left subclavian artery with apparent 80% stenosis (however, this stenosis could be accentuated by blooming artifact from calcified plaque) (series 6, image 67). Right carotid system: CCA and ICA patent within the neck. Soft and calcified plaque within the distal CCA, carotid bifurcation and proximal ICA. Estimated 60% stenosis of the proximal ICA. Left carotid system: CCA and ICA patent within the neck without significant stenosis (50% or greater). Mild soft and calcified plaque within the mid to distal CCA. Moderate predominantly calcified plaque within the carotid bifurcation and proximal ICA. Vertebral arteries: Codominant patent within the neck. Nonstenotic soft and calcified plaque at the origin of the right vertebral artery. Prominent calcified plaque at the origin of the left vertebral artery with suspected moderate stenosis. Skeleton: No acute bony abnormality or aggressive osseous lesion. Prior C5-C7 ACDF. Cervical spondylosis. Other neck: No neck mass or cervical lymphadenopathy. Upper chest: Ground-glass opacity within the imaged lung apices likely reflecting edema. Partially imaged bilateral pleural effusions Review of the MIP images confirms the above findings CTA HEAD FINDINGS Anterior circulation: The intracranial internal carotid  arteries are patent. Severe stenosis of the supraclinoid left ICA. Moderate/severe stenosis of the supraclinoid right ICA. The M1 middle cerebral arteries are patent. There is a distal left M2 branch occlusion within the infarction territory (series 11, image 28). There is a high-grade stenosis within an adjacent distal left M2 branch (series 11, image 28). Additionally, there are foci of high-grade stenosis within multiple proximal left M2 vessels (series 9, image 47). Moderate/severe stenoses within multiple proximal right M2 branch vessels (series 10, image 17) (series 11, image 14). The anterior cerebral arteries are patent. High-grade focal stenosis within the right anterior cerebral artery at the A2/A3 junction (series 11, image 21). 2 mm vascular protrusion arising from the supraclinoid right ICA which may reflect an infundibulum  or aneurysm (series 6, image 228). Posterior circulation: Calcified plaque within the bilateral vertebral arteries at the skull base level and at the level of the proximal V4 segments with moderate stenosis bilaterally. The basilar artery is patent. The posterior cerebral arteries are patent. The posterior communicating arteries are hypoplastic or absent bilaterally. Venous sinuses: Within the limitations of contrast timing, no convincing thrombus. Anatomic variants: As described Review of the MIP images confirms the above findings CT Brain Perfusion Findings: CBF (<30%) Volume: 47mL Perfusion (Tmax>6.0s) volume: 50mL Mismatch Volume: 34mL Infarction Location:Left MCA vascular territory Emergent findings discussed with Dr. Parke Simmers at 4:18 p.m. on 12/17/2020 IMPRESSION: CTA neck: 1. The common carotid and internal carotid arteries are patent within the neck. 2. Estimated 60% atherosclerotic stenosis at the origin of the right ICA. 3. Vertebral arteries patent within the neck. Prominent calcified plaque at the origin of the left vertebral artery with suspected moderate stenosis at this  site. 4. Apparent 80% atherosclerotic stenosis within the proximal left subclavian artery. This stenosis could potentially be accentuated by blooming artifact from prominent calcified plaque at this site. CTA head: 1. Distal left M2 MCA branch occlusion in the acute infarction territory. 2. High-grade stenosis within an adjacent distal left M2 MCA branch vessel. 3. High-grade stenoses within multiple proximal left M2 MCA branch vessels. 4. Moderate/severe stenoses within multiple proximal right M2 MCA branch vessels. 5. Severe stenosis of the supraclinoid left ICA. 6. Moderate/severe stenosis of the supraclinoid 7. Severe stenosis within the right anterior cerebral artery at the A2/A3 junction. 8. Moderate stenosis within the bilateral vertebral arteries at the level of the skull base and proximal V4 segments CT perfusion head: The perfusion software identifies a 14 mL core infarct within the left MCA vascular territory. The perfusion software identifies a 28 mL region of hypoperfused parenchyma within the left MCA vascular territory. Reported mismatch volume: 14 mL. Electronically Signed: By: Kellie Simmering DO On: 12/17/2020 16:58   DG Chest 2 View  Result Date: 12/17/2020 CLINICAL DATA:  Altered mental status. Additional history provided: Low oxygen level, history of CHF, arrhythmia, hypertension. EXAM: CHEST - 2 VIEW COMPARISON:  Prior chest radiographs 11/23/2019 and earlier. FINDINGS: Unchanged cardiomegaly. Aortic atherosclerosis. Bilateral interstitial and ill-defined airspace opacities are similar to the prior examination of 11/23/2019. Small left pleural effusion. No evidence of pneumothorax. No acute bony abnormality identified. ACDF hardware within the lower cervical spine. IMPRESSION: Bilateral interstitial and ill-defined airspace opacities are similar to the prior examination of 11/23/2019 and likely reflect pulmonary edema. Infection cannot be excluded and clinical correlation is recommended. Small  left pleural effusion. Unchanged cardiomegaly. Aortic Atherosclerosis (ICD10-I70.0). Electronically Signed   By: Kellie Simmering DO   On: 12/17/2020 14:47   CT HEAD WO CONTRAST  Result Date: 12/17/2020 CLINICAL DATA:  Transient ischemic attack (TIA). EXAM: CT HEAD WITHOUT CONTRAST TECHNIQUE: Contiguous axial images were obtained from the base of the skull through the vertex without intravenous contrast. COMPARISON:  Brain MRI 06/08/2008. FINDINGS: Brain: Mild generalized cerebral atrophy. 4.9 x 3.3 x 5.4 cm acute to subacute appearing cortical and subcortical left MCA territory infarct within the lateral left frontoparietal lobes, posterosuperior left temporal lobe and left insula. No evidence of hemorrhagic conversion. No significant mass effect. Background mild patchy and ill-defined hypoattenuation within the cerebral white matter is nonspecific, but compatible chronic small vessel ischemic disease. No extra-axial fluid collection. No evidence of intracranial mass. No midline shift. Vascular: No hyperdense vessel.  Atherosclerotic calcifications Skull: Normal. Negative for fracture or  focal lesion. Sinuses/Orbits: Visualized orbits show no acute finding. Right sphenoid sinus air-fluid level. Mild mucosal thickening within the left sphenoid sinus. Mild bilateral ethmoid sinus mucosal thickening. These results were called by telephone at the time of interpretation on 12/17/2020 at 2:43 pm to provider Dr. Charna Archer, who verbally acknowledged these results. IMPRESSION: 4.9 x 3.3 x 5.4 cm acute/subacute appearing cortical and subcortical left MCA territory infarct within the left frontoparietal and left temporal lobes as well as left insula. No evidence of hemorrhagic conversion. No significant mass effect. Background mild cerebral atrophy and chronic small vessel ischemic disease. Paranasal sinus disease as described. Correlate for acute sinusitis. Electronically Signed   By: Kellie Simmering DO   On: 12/17/2020 14:44    CT Angio Neck W and/or Wo Contrast  Addendum Date: 12/17/2020   ADDENDUM REPORT: 12/17/2020 19:09 ADDENDUM: Finding omitted from the impression: 2 mm vascular protrusion arising from the supraclinoid right ICA, which may reflect an infundibulum or aneurysm. These results will be called to the ordering clinician or representative by the Radiologist Assistant, and communication documented in the PACS or [employer]. Electronically Signed   By: Kellie Simmering DO   On: 12/17/2020 19:09   Result Date: 12/17/2020 CLINICAL DATA:  Stroke/TIA, assess intracranial arteries. EXAM: CT ANGIOGRAPHY HEAD AND NECK CT PERFUSION BRAIN TECHNIQUE: Multidetector CT imaging of the head and neck was performed using the standard protocol during bolus administration of intravenous contrast. Multiplanar CT image reconstructions and MIPs were obtained to evaluate the vascular anatomy. Carotid stenosis measurements (when applicable) are obtained utilizing NASCET criteria, using the distal internal carotid diameter as the denominator. Multiphase CT imaging of the brain was performed following IV bolus contrast injection. Subsequent parametric perfusion maps were calculated using RAPID software. CONTRAST:  131mL OMNIPAQUE IOHEXOL 350 MG/ML SOLN COMPARISON:  Noncontrast head CT performed earlier today 12/17/2020. Brain MRI 06/08/2008. FINDINGS: CTA NECK FINDINGS Aortic arch: Standard aortic branching. Atherosclerotic plaque within the visualized aortic arch and proximal major branch vessels of the neck. No hemodynamically significant innominate or proximal right subclavian artery stenosis. Prominent soft and calcified plaque within the proximal left subclavian artery with apparent 80% stenosis (however, this stenosis could be accentuated by blooming artifact from calcified plaque) (series 6, image 67). Right carotid system: CCA and ICA patent within the neck. Soft and calcified plaque within the distal CCA, carotid bifurcation and  proximal ICA. Estimated 60% stenosis of the proximal ICA. Left carotid system: CCA and ICA patent within the neck without significant stenosis (50% or greater). Mild soft and calcified plaque within the mid to distal CCA. Moderate predominantly calcified plaque within the carotid bifurcation and proximal ICA. Vertebral arteries: Codominant patent within the neck. Nonstenotic soft and calcified plaque at the origin of the right vertebral artery. Prominent calcified plaque at the origin of the left vertebral artery with suspected moderate stenosis. Skeleton: No acute bony abnormality or aggressive osseous lesion. Prior C5-C7 ACDF. Cervical spondylosis. Other neck: No neck mass or cervical lymphadenopathy. Upper chest: Ground-glass opacity within the imaged lung apices likely reflecting edema. Partially imaged bilateral pleural effusions Review of the MIP images confirms the above findings CTA HEAD FINDINGS Anterior circulation: The intracranial internal carotid arteries are patent. Severe stenosis of the supraclinoid left ICA. Moderate/severe stenosis of the supraclinoid right ICA. The M1 middle cerebral arteries are patent. There is a distal left M2 branch occlusion within the infarction territory (series 11, image 28). There is a high-grade stenosis within an adjacent distal left M2 branch (series  11, image 28). Additionally, there are foci of high-grade stenosis within multiple proximal left M2 vessels (series 9, image 47). Moderate/severe stenoses within multiple proximal right M2 branch vessels (series 10, image 17) (series 11, image 14). The anterior cerebral arteries are patent. High-grade focal stenosis within the right anterior cerebral artery at the A2/A3 junction (series 11, image 21). 2 mm vascular protrusion arising from the supraclinoid right ICA which may reflect an infundibulum or aneurysm (series 6, image 228). Posterior circulation: Calcified plaque within the bilateral vertebral arteries at the  skull base level and at the level of the proximal V4 segments with moderate stenosis bilaterally. The basilar artery is patent. The posterior cerebral arteries are patent. The posterior communicating arteries are hypoplastic or absent bilaterally. Venous sinuses: Within the limitations of contrast timing, no convincing thrombus. Anatomic variants: As described Review of the MIP images confirms the above findings CT Brain Perfusion Findings: CBF (<30%) Volume: 76mL Perfusion (Tmax>6.0s) volume: 47mL Mismatch Volume: 34mL Infarction Location:Left MCA vascular territory Emergent findings discussed with Dr. Parke Simmers at 4:18 p.m. on 12/17/2020 IMPRESSION: CTA neck: 1. The common carotid and internal carotid arteries are patent within the neck. 2. Estimated 60% atherosclerotic stenosis at the origin of the right ICA. 3. Vertebral arteries patent within the neck. Prominent calcified plaque at the origin of the left vertebral artery with suspected moderate stenosis at this site. 4. Apparent 80% atherosclerotic stenosis within the proximal left subclavian artery. This stenosis could potentially be accentuated by blooming artifact from prominent calcified plaque at this site. CTA head: 1. Distal left M2 MCA branch occlusion in the acute infarction territory. 2. High-grade stenosis within an adjacent distal left M2 MCA branch vessel. 3. High-grade stenoses within multiple proximal left M2 MCA branch vessels. 4. Moderate/severe stenoses within multiple proximal right M2 MCA branch vessels. 5. Severe stenosis of the supraclinoid left ICA. 6. Moderate/severe stenosis of the supraclinoid 7. Severe stenosis within the right anterior cerebral artery at the A2/A3 junction. 8. Moderate stenosis within the bilateral vertebral arteries at the level of the skull base and proximal V4 segments CT perfusion head: The perfusion software identifies a 14 mL core infarct within the left MCA vascular territory. The perfusion software identifies a  28 mL region of hypoperfused parenchyma within the left MCA vascular territory. Reported mismatch volume: 14 mL. Electronically Signed: By: Kellie Simmering DO On: 12/17/2020 16:58   MR BRAIN W WO CONTRAST  Result Date: 12/18/2020 CLINICAL DATA:  Left MCA branch vessel stroke presentation yesterday. EXAM: MRI HEAD WITHOUT AND WITH CONTRAST TECHNIQUE: Multiplanar, multiecho pulse sequences of the brain and surrounding structures were obtained without and with intravenous contrast. CONTRAST:  73mL GADAVIST GADOBUTROL 1 MMOL/ML IV SOLN COMPARISON:  CT studies yesterday. FINDINGS: Brain: Diffusion imaging shows acute infarction in the deep insula and frontoparietal junction region on the left, the region of infarction measuring up to 6 cm in diameter. Mild swelling. Minimal petechial blood products suggested on the susceptibility weighted imaging, but no frank hematoma. No mass effect or shift. Brainstem is normal. Few old small vessel cerebellar infarctions. Old small vessel infarction in both thalami. Mild for age chronic small vessel ischemic change within the hemispheric white matter. No hydrocephalus. No extra-axial collection. No mass lesion. Vascular: Major vessels at the base of the brain show flow. Skull and upper cervical spine: Negative Sinuses/Orbits: Clear/normal Other: None IMPRESSION: 1. Acute infarction in the deep insula and frontoparietal junction region on the left measuring up to 6 cm in diameter. Mild  swelling. Minimal petechial blood products suggested on the susceptibility weighted imaging, but no frank hematoma. No mass effect or shift. 2. Mild for age chronic small-vessel ischemic changes elsewhere throughout the brain as outlined above. Electronically Signed   By: Nelson Chimes M.D.   On: 12/18/2020 12:16   CT CEREBRAL PERFUSION W CONTRAST  Addendum Date: 12/17/2020   ADDENDUM REPORT: 12/17/2020 19:09 ADDENDUM: Finding omitted from the impression: 2 mm vascular protrusion arising from the  supraclinoid right ICA, which may reflect an infundibulum or aneurysm. These results will be called to the ordering clinician or representative by the Radiologist Assistant, and communication documented in the PACS or [employer]. Electronically Signed   By: Kellie Simmering DO   On: 12/17/2020 19:09   Result Date: 12/17/2020 CLINICAL DATA:  Stroke/TIA, assess intracranial arteries. EXAM: CT ANGIOGRAPHY HEAD AND NECK CT PERFUSION BRAIN TECHNIQUE: Multidetector CT imaging of the head and neck was performed using the standard protocol during bolus administration of intravenous contrast. Multiplanar CT image reconstructions and MIPs were obtained to evaluate the vascular anatomy. Carotid stenosis measurements (when applicable) are obtained utilizing NASCET criteria, using the distal internal carotid diameter as the denominator. Multiphase CT imaging of the brain was performed following IV bolus contrast injection. Subsequent parametric perfusion maps were calculated using RAPID software. CONTRAST:  153mL OMNIPAQUE IOHEXOL 350 MG/ML SOLN COMPARISON:  Noncontrast head CT performed earlier today 12/17/2020. Brain MRI 06/08/2008. FINDINGS: CTA NECK FINDINGS Aortic arch: Standard aortic branching. Atherosclerotic plaque within the visualized aortic arch and proximal major branch vessels of the neck. No hemodynamically significant innominate or proximal right subclavian artery stenosis. Prominent soft and calcified plaque within the proximal left subclavian artery with apparent 80% stenosis (however, this stenosis could be accentuated by blooming artifact from calcified plaque) (series 6, image 67). Right carotid system: CCA and ICA patent within the neck. Soft and calcified plaque within the distal CCA, carotid bifurcation and proximal ICA. Estimated 60% stenosis of the proximal ICA. Left carotid system: CCA and ICA patent within the neck without significant stenosis (50% or greater). Mild soft and calcified plaque  within the mid to distal CCA. Moderate predominantly calcified plaque within the carotid bifurcation and proximal ICA. Vertebral arteries: Codominant patent within the neck. Nonstenotic soft and calcified plaque at the origin of the right vertebral artery. Prominent calcified plaque at the origin of the left vertebral artery with suspected moderate stenosis. Skeleton: No acute bony abnormality or aggressive osseous lesion. Prior C5-C7 ACDF. Cervical spondylosis. Other neck: No neck mass or cervical lymphadenopathy. Upper chest: Ground-glass opacity within the imaged lung apices likely reflecting edema. Partially imaged bilateral pleural effusions Review of the MIP images confirms the above findings CTA HEAD FINDINGS Anterior circulation: The intracranial internal carotid arteries are patent. Severe stenosis of the supraclinoid left ICA. Moderate/severe stenosis of the supraclinoid right ICA. The M1 middle cerebral arteries are patent. There is a distal left M2 branch occlusion within the infarction territory (series 11, image 28). There is a high-grade stenosis within an adjacent distal left M2 branch (series 11, image 28). Additionally, there are foci of high-grade stenosis within multiple proximal left M2 vessels (series 9, image 47). Moderate/severe stenoses within multiple proximal right M2 branch vessels (series 10, image 17) (series 11, image 14). The anterior cerebral arteries are patent. High-grade focal stenosis within the right anterior cerebral artery at the A2/A3 junction (series 11, image 21). 2 mm vascular protrusion arising from the supraclinoid right ICA which may reflect an infundibulum or aneurysm (  series 6, image 228). Posterior circulation: Calcified plaque within the bilateral vertebral arteries at the skull base level and at the level of the proximal V4 segments with moderate stenosis bilaterally. The basilar artery is patent. The posterior cerebral arteries are patent. The posterior  communicating arteries are hypoplastic or absent bilaterally. Venous sinuses: Within the limitations of contrast timing, no convincing thrombus. Anatomic variants: As described Review of the MIP images confirms the above findings CT Brain Perfusion Findings: CBF (<30%) Volume: 70mL Perfusion (Tmax>6.0s) volume: 28mL Mismatch Volume: 45mL Infarction Location:Left MCA vascular territory Emergent findings discussed with Dr. Parke Simmers at 4:18 p.m. on 12/17/2020 IMPRESSION: CTA neck: 1. The common carotid and internal carotid arteries are patent within the neck. 2. Estimated 60% atherosclerotic stenosis at the origin of the right ICA. 3. Vertebral arteries patent within the neck. Prominent calcified plaque at the origin of the left vertebral artery with suspected moderate stenosis at this site. 4. Apparent 80% atherosclerotic stenosis within the proximal left subclavian artery. This stenosis could potentially be accentuated by blooming artifact from prominent calcified plaque at this site. CTA head: 1. Distal left M2 MCA branch occlusion in the acute infarction territory. 2. High-grade stenosis within an adjacent distal left M2 MCA branch vessel. 3. High-grade stenoses within multiple proximal left M2 MCA branch vessels. 4. Moderate/severe stenoses within multiple proximal right M2 MCA branch vessels. 5. Severe stenosis of the supraclinoid left ICA. 6. Moderate/severe stenosis of the supraclinoid 7. Severe stenosis within the right anterior cerebral artery at the A2/A3 junction. 8. Moderate stenosis within the bilateral vertebral arteries at the level of the skull base and proximal V4 segments CT perfusion head: The perfusion software identifies a 14 mL core infarct within the left MCA vascular territory. The perfusion software identifies a 28 mL region of hypoperfused parenchyma within the left MCA vascular territory. Reported mismatch volume: 14 mL. Electronically Signed: By: Kellie Simmering DO On: 12/17/2020 16:58   DG  Chest Portable 1 View  Result Date: 12/17/2020 CLINICAL DATA:  Weakness, shortness of breath and hypotension. EXAM: PORTABLE CHEST 1 VIEW COMPARISON:  PA and lateral chest 12/17/2020. FINDINGS: There is cardiomegaly and pulmonary edema. Aeration appears improved. No focal airspace disease. Small bilateral pleural effusions, greater on the left. IMPRESSION: Improved aeration compared to the study earlier today. Cardiomegaly, interstitial edema and small left effusion. Electronically Signed   By: Inge Rise M.D.   On: 12/17/2020 17:40   ECHOCARDIOGRAM COMPLETE  Result Date: 12/19/2020    ECHOCARDIOGRAM REPORT   Patient Name:   [name] Date of Exam: 12/18/2020 Medical Rec #:  [id number]        Height:       67.0 in Accession #:    [id number]       Weight:       125.0 lb Date of Birth:  [birth year]       BSA:          1.656 m Patient Age:    70 years         BP:           152/48 mmHg Patient Gender: F                HR:           58 bpm. Exam Location:  [facility] Procedure: 2D Echo Indications:     Stroke I63.9  History:         Patient has prior history of Echocardiogram examinations, most  recent 11/24/2019.  Sonographer:     Arville Go RDCS Referring Phys:  [id number] [place] L [place] Diagnosing Phys: Serafina Royals MD IMPRESSIONS  1. Left ventricular ejection fraction, by estimation, is 35 to 40%. The left ventricle has moderately decreased function. The left ventricle demonstrates global hypokinesis. The left ventricular internal cavity size was mildly dilated. There is mild left ventricular hypertrophy. Left ventricular diastolic parameters are consistent with Grade II diastolic dysfunction (pseudonormalization).  2. Right ventricular systolic function is normal. The right ventricular size is normal.  3. Left atrial size was moderately dilated.  4. Right atrial size was mildly dilated.  5. The mitral valve is normal in structure. Moderate to severe mitral valve regurgitation.   6. The aortic valve is calcified. Aortic valve regurgitation is mild. Moderate aortic valve stenosis. FINDINGS  Left Ventricle: Left ventricular ejection fraction, by estimation, is 35 to 40%. The left ventricle has moderately decreased function. The left ventricle demonstrates global hypokinesis. The left ventricular internal cavity size was mildly dilated. There is mild left ventricular hypertrophy. Left ventricular diastolic parameters are consistent with Grade II diastolic dysfunction (pseudonormalization). Right Ventricle: The right ventricular size is normal. No increase in right ventricular wall thickness. Right ventricular systolic function is normal. Left Atrium: Left atrial size was moderately dilated. Right Atrium: Right atrial size was mildly dilated. Pericardium: There is no evidence of pericardial effusion. Mitral Valve: The mitral valve is normal in structure. Moderate to severe mitral valve regurgitation. Tricuspid Valve: The tricuspid valve is normal in structure. Tricuspid valve regurgitation is mild. Aortic Valve: The aortic valve is calcified. Aortic valve regurgitation is mild. Aortic regurgitation PHT measures 668 msec. Moderate aortic stenosis is present. Aortic valve mean gradient measures 16.5 mmHg. Aortic valve peak gradient measures 29.4 mmHg. Aortic valve area, by VTI measures 0.56 cm. Pulmonic Valve: The pulmonic valve was normal in structure. Pulmonic valve regurgitation is trivial. Aorta: The aortic root and ascending aorta are structurally normal, with no evidence of dilitation. IAS/Shunts: No atrial level shunt detected by color flow Doppler.  LEFT VENTRICLE PLAX 2D LVIDd:         5.13 cm      Diastology LVIDs:         4.04 cm      LV e' medial:    3.70 cm/s LV PW:         1.30 cm      LV E/e' medial:  30.5 LV IVS:        1.32 cm      LV e' lateral:   4.03 cm/s LVOT diam:     1.80 cm      LV E/e' lateral: 28.0 LV SV:         40 LV SV Index:   24 LVOT Area:     2.54 cm  LV Volumes  (MOD) LV vol d, MOD A4C: 106.0 ml LV vol s, MOD A4C: 65.2 ml LV SV MOD A4C:     106.0 ml RIGHT VENTRICLE RV Basal diam:  3.29 cm RV S prime:     11.10 cm/s TAPSE (M-mode): 1.8 cm LEFT ATRIUM              Index       RIGHT ATRIUM           Index LA diam:        4.10 cm  2.48 cm/m  RA Area:     17.10 cm LA Vol (A2C):   110.0 ml 66.42 ml/m  RA Volume:   50.30 ml  30.37 ml/m LA Vol (A4C):   65.7 ml  39.67 ml/m LA Biplane Vol: 85.8 ml  51.81 ml/m  AORTIC VALVE                    PULMONIC VALVE AV Area (Vmax):    0.59 cm     PV Vmax:       1.11 m/s AV Area (Vmean):   0.55 cm     PV Peak grad:  4.9 mmHg AV Area (VTI):     0.56 cm AV Vmax:           271.00 cm/s AV Vmean:          185.500 cm/s AV VTI:            0.718 m AV Peak Grad:      29.4 mmHg AV Mean Grad:      16.5 mmHg LVOT Vmax:         62.50 cm/s LVOT Vmean:        39.800 cm/s LVOT VTI:          0.159 m LVOT/AV VTI ratio: 0.22 AI PHT:            668 msec  AORTA Ao Root diam: 2.50 cm MITRAL VALVE                 TRICUSPID VALVE MV Area (PHT): 2.11 cm      TV Peak grad:   29.4 mmHg MV Decel Time: 360 msec      TV Vmax:        2.71 m/s MR Peak grad:    125.4 mmHg MR Mean grad:    74.0 mmHg   SHUNTS MR Vmax:         560.00 cm/s Systemic VTI:  0.16 m MR Vmean:        397.0 cm/s  Systemic Diam: 1.80 cm MR PISA:         2.26 cm MR PISA Eff ROA: 12 mm MR PISA Radius:  0.60 cm MV E velocity: 113.00 cm/s MV A velocity: 76.00 cm/s MV E/A ratio:  1.49 Serafina Royals MD Electronically signed by Serafina Royals MD Signature Date/Time: 12/19/2020/7:37:03 AM    Final     Scheduled Meds: . aspirin  81 mg Per Tube Daily  . carvedilol  6.25 mg Per Tube BID WC  . Chlorhexidine Gluconate Cloth  6 each Topical Daily  . clopidogrel  75 mg Per Tube Daily  . free water  100 mL Per Tube Q6H  . insulin aspart  0-9 Units Subcutaneous Q4H  . pantoprazole (PROTONIX) IV  40 mg Intravenous Q24H  . rOPINIRole  2 mg Per J Tube BID   Continuous Infusions: . sodium chloride  Stopped (12/18/20 0819)  . feeding supplement (JEVITY 1.2 CAL)      Assessment/Plan:  1. 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  Patient does have minimal petechial blood products on MRI.  Case discussed with neurology and okay continuing aspirin and Plavix.  Physical therapy evaluation.  Okay to transfer out of the ICU.  Has an allergy to statins.  Would like blood pressure on the higher side. 2. Acute systolic congestive heart failure.  Cardiology placed back on Coreg.  We will start low-dose Lasix tomorrow orally. 3. Acute hypoxic respiratory failure initially requiring BiPAP.  On admission patient did have a pulse ox of 68% on room air. 4.  Type 2 diabetes mellitus.  Hemoglobin A1c 6.9.  Diet controlled. 5. Restless leg syndrome restart Requip 6. Incidental aneurysm seen on imaging.  Seen by neurosurgery and recommended against intervention. 7. Weakness.  Physical therapy evaluation 8. Restart tube feeding.    Code Status:     Code Status Orders  (From admission, onward)         Start     Ordered   12/17/20 1928  Full code  Continuous        12/17/20 1929        Code Status History    Date Active Date Inactive Code Status Order ID Comments User Context   11/23/2019 1331 11/26/2019 1713 Full Code [id number]  Para Skeans, MD ED   06/23/2018 1723 06/25/2018 1835 Full Code [id number]  Nicholes Mango, MD Inpatient   06/11/2018 1559 06/13/2018 1803 Full Code [id number]  Vaughan Basta, MD Inpatient   03/18/2017 1033 03/18/2017 1544 Full Code [id number]  Algernon Huxley, MD Inpatient   Advance Care Planning Activity    Advance Directive Documentation   Flowsheet Row Most Recent Value  Type of Advance Directive Healthcare Power of Attorney, Living will  Pre-existing out of facility DNR order (yellow form or pink MOST form) -  "MOST" Form in Place? -     Family Communication: Spoke with daughter on the phone Disposition Plan: Status is:  Inpatient  Dispo: The patient is from: Home              Anticipated d/c is to: Rehab versus home depending on how she does with physical therapy              Patient currently stable to transfer out of the ICU.  Need to see how she does with physical therapy.  Restart tube feeding.   Difficult to place patient.  Hopefully not.  Time spent: 28 minutes  [place]

## 2020-12-19 NOTE — Evaluation (Signed)
Occupational Therapy Evaluation Patient Details Name: [name] Day MRN: [id number] DOB: May 24, 1929 Today's Date: 12/19/2020    History of Present Illness Pt is an 87 yo F presenting to the ED from home with her daughter with complaints of malaise, confusion & weakness.  MD assessment includes: acute left MCA stroke, acute hypoxic respiratory failure secondary to flash pulmonary edema, and HTN. PMH includes anemia, aortic valve sclerosis, arrhythmia, arthritis, basal cell carcinoma of skin, brain tumor, brain tumor, cervical spine disease, CHF, DM, dysrhythmia, esophageal stricture, esophageal ulcer without bleeding, gastrostomy tube dependent, GERD, history of kidney stones, hyperlipemia, hypertension, kidney stones, leaky heart valve, meningioma, occlusive mesenteric ischemia, pulmonary hypertension, RLS, and vertigo.   Clinical Impression   Pt seen for OT evaluation this date. Prior to hospital admission, pt was modified independent with ADL mobility and all aspects of ADL and IADL, including some limiting driving, managing her own medication including Gtube feeds, and also attempting church. Daughter present and reports she and her sister (pt's other dtr) are very supportive and able to help at discharge. Family does assist with yard work. Pt alert, oriented, and agreeable to therapy. Currently pt demonstrates significant impairments in RUE/RLE strength, coordination, and sensation. Pt is R hand dominant and requires MIN assist for seated and standing UB and LB ADL and MIN assist for ADL transfers with a RW due to R side deficits, with occasional VC for increasing visual attention to RUE. Pt with noted expressive language deficits, but at times more clear with words. Pt is eager to return to his PLOF with increased independence and functional use of her R arm and leg. Pt educated on safe positioning of RUE in order to promote functional return, improve safety, and promote skin integrity on this  date. Pt/dtr instructed in role of OT, benefits of high intensity skilled OT services for post stroke recovery, importance of RUE/RLE movement and involvement in tasks. Pt was motivated t/o OT session. Pt would benefit from high intensity skilled OT to address noted impairments and functional limitations (see below for any additional details) in order to maximize safety and independence while minimizing falls risk and caregiver burden.  Upon hospital discharge, recommend pt discharge to CIR to maximize safety and return to PLOF.    Follow Up Recommendations  CIR    Equipment Recommendations  Other (comment) (2WW)    Recommendations for Other Services       Precautions / Restrictions Precautions Precautions: Fall Precaution Comments: HOB > 30 deg Restrictions Weight Bearing Restrictions: No      Mobility Bed Mobility Overal bed mobility: Modified Independent             General bed mobility comments: Extra time and effort only    Transfers Overall transfer level: Needs assistance Equipment used: Rolling walker (2 wheeled) Transfers: Sit to/from Stand Sit to Stand: Min guard         General transfer comment: cues for hand placement    Balance Overall balance assessment: Needs assistance Sitting-balance support: Feet supported;No upper extremity supported Sitting balance-Leahy Scale: Good     Standing balance support: Bilateral upper extremity supported;During functional activity Standing balance-Leahy Scale: Poor Standing balance comment: PRN Min A for balance during dynamic balance challenges, required BUE support on RW                           ADL either performed or assessed with clinical judgement   ADL Overall ADL's :  Needs assistance/impaired   Eating/Feeding Details (indicate cue type and reason): Gtube feeds at baseline Grooming: Sitting;Minimal assistance   Upper Body Bathing: Sitting;Minimal assistance   Lower Body Bathing: Sit to/from  stand;Minimal assistance   Upper Body Dressing : Sitting;Minimal assistance   Lower Body Dressing: Sit to/from stand;Minimal assistance   Toilet Transfer: Minimal assistance;BSC;RW;Ambulation             General ADL Comments: grossly MIN A for grooming, UB and LB ADL tasks, intermittent cues for RUE attention/safety     Vision Baseline Vision/History: Wears glasses Wears Glasses: At all times Patient Visual Report: No change from baseline Vision Assessment?: No apparent visual deficits     Perception     Praxis      Pertinent Vitals/Pain Pain Assessment: No/denies pain     Hand Dominance Right   Extremity/Trunk Assessment Upper Extremity Assessment Upper Extremity Assessment: RUE deficits/detail;LUE deficits/detail RUE Deficits / Details: very mild pronator drift noted, impaired FMC/GMC, impaired sensation, mild visual inattention to RUE during tasks, shoulder flexion grossly 4-/5, elbow flexion 4/5, elbow extension 4-/5, grip 3+/5, arthritic joints RUE Sensation: decreased light touch;decreased proprioception RUE Coordination: decreased fine motor;decreased gross motor LUE Deficits / Details: strength WFL, very mild coordination deficits but functionally not limiting, arthritic joints   Lower Extremity Assessment Lower Extremity Assessment: Defer to PT evaluation;Generalized weakness;RLE deficits/detail;LLE deficits/detail RLE Deficits / Details: RLE strength grossly 4/5 and equal L/R RLE Sensation: decreased light touch RLE Coordination: decreased gross motor LLE Deficits / Details: LLE strength grossly 4/5 and equal L/R LLE Coordination: WNL       Communication Communication Communication: Expressive difficulties   Cognition Arousal/Alertness: Awake/alert Behavior During Therapy: WFL for tasks assessed/performed Overall Cognitive Status: Impaired/Different from baseline Area of Impairment: Problem solving                             Problem  Solving: Slow processing;Requires verbal cues General Comments: impaired expressive language, cues intermittently for safety/sequencing   General Comments       Exercises Total Joint Exercises Ankle Circles/Pumps: AROM;Strengthening;Both;10 reps Quad Sets: Strengthening;Both;10 reps Gluteal Sets: 10 reps;Both;Strengthening Hip ABduction/ADduction: Strengthening;Both;10 reps (with manual resistance) Straight Leg Raises: Strengthening;Both;10 reps Long Arc Quad: Strengthening;Both;5 reps Knee Flexion: 5 reps;Both;Strengthening Marching in Standing: AROM;Strengthening;Both;5 reps;Standing Other Exercises Other Exercises: HEP education with pt and dtr for BLE APs, QS, and GS x 10 each every 1-2 hours daily Other Exercises: Pt/dtr instructed in role of OT, benefits of high intensity skilled OT services for post stroke recovery, importance of RUE/RLE movement and involvement in tasks   Shoulder Instructions      Home Living Family/patient expects to be discharged to:: Private residence Living Arrangements: Alone Available Help at Discharge: Family;Available 24 hours/day (pt has 2 daughters, daughter present endorses ability to provide 24/7) Type of Home: House Home Access: Ramped entrance     Home Layout: One level     Bathroom Shower/Tub: Teacher, early years/pre: Handicapped height     Home Equipment: Environmental consultant - 2 wheels;Walker - 4 wheels;Cane - quad;Bedside commode   Additional Comments: Dtr in room provided history secondary to pt with expressive difficulty      Prior Functioning/Environment Level of Independence: Independent with assistive device(s)        Comments: Mod Ind amb with a rollator in the home and a QC in the community, Ind with ADL including Gtube feedings, takes sponge baths, no recent fall history;  two daughters and other family are able to cover 24 hr supervision if needed        OT Problem List: Decreased strength;Decreased  coordination;Decreased safety awareness;Impaired sensation;Impaired UE functional use;Decreased knowledge of use of DME or AE;Impaired balance (sitting and/or standing)      OT Treatment/Interventions: Self-care/ADL training;Therapeutic exercise;Therapeutic activities;Neuromuscular education;Cognitive remediation/compensation;Balance training;DME and/or AE instruction;Patient/family education    OT Goals(Current goals can be found in the care plan section) Acute Rehab OT Goals Patient Stated Goal: do as much therapy as possible to get better OT Goal Formulation: With patient/family Time For Goal Achievement: 01/02/21 Potential to Achieve Goals: Good ADL Goals Pt Will Perform Grooming: with set-up;with supervision;standing Pt Will Perform Upper Body Dressing: sitting;with min guard assist;with supervision Pt Will Perform Lower Body Dressing: sit to/from stand;with supervision;with set-up Pt Will Transfer to Toilet: with supervision;ambulating;bedside commode (LRAD for amb) Pt Will Perform Toileting - Clothing Manipulation and hygiene: with supervision;sit to/from stand Additional ADL Goal #1: Pt will perform BUE exercise program with handout and supervision.  OT Frequency: Min 3X/week   Barriers to D/C:    no barriers identified, family very supportive       Co-evaluation              AM-PAC OT "6 Clicks" Daily Activity     Outcome Measure Help from another person eating meals?: A Lot (Gtube feedings) Help from another person taking care of personal grooming?: A Little Help from another person toileting, which includes using toliet, bedpan, or urinal?: A Little Help from another person bathing (including washing, rinsing, drying)?: A Little Help from another person to put on and taking off regular upper body clothing?: A Little Help from another person to put on and taking off regular lower body clothing?: A Little 6 Click Score: 17   End of Session Equipment Utilized During  Treatment: Gait belt;Rolling walker  Activity Tolerance: Patient tolerated treatment well Patient left: in bed;with call bell/phone within reach;with bed alarm set;with family/visitor present;with SCD's reapplied  OT Visit Diagnosis: Other abnormalities of gait and mobility (R26.89);Hemiplegia and hemiparesis Hemiplegia - Right/Left: Right Hemiplegia - dominant/non-dominant: Dominant Hemiplegia - caused by: Cerebral infarction                Time: 4599-7741 OT Time Calculation (min): 46 min Charges:  OT General Charges $OT Visit: 1 Visit OT Evaluation $OT Eval High Complexity: 1 High OT Treatments $Neuromuscular Re-education: 23-37 mins  Hanley Hays, MPH, MS, OTR/L ascom [phone] 12/19/20, 5:13 PM

## 2020-12-19 NOTE — Progress Notes (Signed)
Rehab Admissions Coordinator Note:  Patient was screened by Cleatrice Burke for appropriateness for an Inpatient Acute Rehab Consult to assess for candidacy for a possible CIR admit at [facility]. .  At this time, we are recommending Inpatient Rehab consult. I will place order for consult and an [facility] will follow up with full rehab  assessment tomorrow.  Cleatrice Burke RN MSN 12/19/2020, 6:27 PM  I can be reached at [phone].

## 2020-12-19 NOTE — Progress Notes (Signed)
Neurology Progress Note  Patient ID: [name] Day is a 87 y.o. with past medical history significant for hypertension, hyperlipidemia, type 2 diabetes, pulmonary hypertension, meningioma, cervical spine stenosis s/p C-spine fusion, congestive heart failure, arrhythmia, severe esophageal stricture s/p feeding tube, sinus arrhythmia not on anticoagulation.   Initially consulted for: Code stroke  Major interval events:  - restarted on Coreg - SBP largely 140-160, with excursions to 110s and 180s  Subjective: -Markedly improved aphasia today compared to examination yesterday.  Exam: Vitals:   12/19/20 0500 12/19/20 0600  BP: (!) 166/69 (!) 163/62  Pulse: 90 81  Resp: 16 14  Temp:    SpO2: 96% 93%   Gen: In bed, comfortable  Resp: non-labored breathing, no grossly audible wheezing Cardiac: Perfusing extremities well  Abd: soft, nt  Neuro: MS: Today she is much more briskly follows commands.  She is able to repeat and name simple things.  Fluency of speech is impaired and when asked to name her daughter she states her name, but then is able to accurately answer yes/no questions about her daughter's name.  With some delay she is able to show me her thumb on command and 2 more briskly sticks out her tongue today.   CN: Pupils equal and round, face with much improved very mild right facial droop, EOMI, orients to stimuli in all visual fields, tongue midline Motor: Cooperation with confrontational testing is limited by aphasia but with mimicking and repeated instructions in different ways she is able to perform.  Hip flexor weakness is notably 4/5 on the right compared to 5/5 on the left today.  Additionally she has some pronation of the right upper extremity without drift today. Coordination: High-five intact bilaterally   Pertinent Data:  Cr 1.1 today, stable from  1.1 on 3/20  BNP 982.2 Initial troponin 16, then 92 on 3/19  Lab Results  Component Value Date   VITAMINB12 960 (H)  11/23/2019    Lab Results  Component Value Date   HGBA1C 6.9 (H) 12/18/2020   Lab Results  Component Value Date   CHOL 178 12/18/2020   HDL 56 12/18/2020   LDLCALC 112 (H) 12/18/2020   TRIG 51 12/18/2020   CHOLHDL 3.2 12/18/2020   Lab Results  Component Value Date   TSH 4.466 11/23/2019    Head CT with hypodensity in the superior left MCA territory CTA with multifocal stenoses especially in the left MCA territory (which trifurcates) and a fair burden of atherosclerosis throughout CTA/CT perfusion rapid read as 14 mL core infarct and 28 mL of area at risk, likely slightly underestimated and slightly overestimated on my personal review when comparing dry head CT hypodensity to CT perfusion studies MRI brain personally reviewed, full radiology read pending.  Infarct correlating to hypodensity seen on head CT, with some petechial hemorrhage.  ECHO:  1. Left ventricular ejection fraction, by estimation, is 35 to 40%. The  left ventricle has moderately decreased function. The left ventricle  demonstrates global hypokinesis. The left ventricular internal cavity size  was mildly dilated. There is mild  left ventricular hypertrophy. Left ventricular diastolic parameters are  consistent with Grade II diastolic dysfunction (pseudonormalization).  2. Right ventricular systolic function is normal. The right ventricular  size is normal.  3. Left atrial size was moderately dilated.  4. Right atrial size was mildly dilated.  5. The mitral valve is normal in structure. Moderate to severe mitral  valve regurgitation.  6. The aortic valve is calcified. Aortic valve  regurgitation is mild.  Moderate aortic valve stenosis.   Impression: This is an 87 year old woman with multiple vascular risk factors as detailed above, presenting with a left MCA territory stroke out of the window.  Given her examination is improved today with a higher blood pressure at the time of my examination (systolic  blood pressure of 170s), I do think she may still be perfusion dependent.  Recommendations: # Left MCA territory stroke, likely atheroembolic, cannot rule out cardioembolic at this time - MRI brain completed - Continue neurochecks per protocol - Stroke labs TSH, HgbA1c, fasting lipid panel completed as above,   A1c 6.9, TSH normal at 4.466, LDL elevated at 112 - Echocardiogram completed, notable for EF 35-40%, Grade II DD, moderate LAE which puts her at increased risk of atrial fibrillation - Continue 81 mg daily aspirin lifelong - Plavix 300 mg load with 75 mg daily for 90 day course  - Unfortunately does not tolerate statin due to muscle pain, referred to advanced lipid clinic for consideration of PSK 9 inhibitor (order placed) - Risk factor modification - Telemetry monitoring; event monitor on discharge if no arrythmias captured  - Blood pressure goal: No blood pressure medication changes today, please allow patient to continue to autoregulate; she does appear to be tolerating the Coreg at this time - PT consult, OT consult, Speech consult - Neurology to follow  # Hypoxic respiratory distress, resolved -Likely secondary to hypertension driven by her stroke -now off BiPAP, weaned to nasal cannula -if neurological examination worsens would fluid bolus and attempt to lay flat if respiratory status allows, given her apparent pressure dependent exam today  Lesleigh Noe MD-PhD [facility] [phone]

## 2020-12-19 NOTE — Progress Notes (Signed)
Hypoglycemic Event  CBG: 62  Treatment: Dextrose 50% 12.5G  Symptoms: Asymptomatic  Follow-up CBG: Time 0441 CBG Result: 145  Possible Reasons for Event: Pt NPO, awaiting swallow study 3/21  Comments/MD notified: [name], NP   Delfino Lovett

## 2020-12-19 NOTE — Evaluation (Signed)
Physical Therapy Evaluation Patient Details Name: [name] Day MRN: [id number] DOB: 17-Aug-1929 Today's Date: 12/19/2020   History of Present Illness  Pt is an 87 yo F presenting to the ED from home with her daughter with complaints of malaise, confusion & weakness.  MD assessment includes: acute left MCA stroke, acute hypoxic respiratory failure secondary to flash pulmonary edema, and HTN. PMH includes anemia, aortic valve sclerosis, arrhythmia, arthritis, basal cell carcinoma of skin, brain tumor, brain tumor, cervical spine disease, CHF, DM, dysrhythmia, esophageal stricture, esophageal ulcer without bleeding, gastrostomy tube dependent, GERD, history of kidney stones, hyperlipemia, hypertension, kidney stones, leaky heart valve, meningioma, occlusive mesenteric ischemia, pulmonary hypertension, RLS, and vertigo.    Clinical Impression  Pt was pleasant and motivated to participate during the session.  Pt unable to provide history secondary to expressive difficulties but generally followed commands well with occasional extra time and cuing.  Pt presented with functional and equal BLE strength left/right but did present with noted decrease in RUE strength compared to the left, especially with grip strength.  Pt also presented with mild deficits in coordination of the RLE and more pronounced deficits in coordination with the RUE. Pt was mildly inattentive to her RUE/hand at times and required extra time and cuing to manage tasks using that extremity.  Pt did not require physical assistance with bed mobility or transfers but had difficulty with ambulation. Pt needed occasional assist and extra time/cuing maintaining her R hand on the walker handle and also presented with mild R lateral lean during ambulation requiring min A to correct.  Pt will benefit from PT services in an IR setting upon discharge to safely address deficits listed in patient problem list for decreased caregiver assistance and eventual  return to PLOF.      Follow Up Recommendations CIR    Equipment Recommendations  None recommended by PT    Recommendations for Other Services       Precautions / Restrictions Precautions Precautions: Fall Precaution Comments: HOB > 30 deg Restrictions Weight Bearing Restrictions: No      Mobility  Bed Mobility Overal bed mobility: Modified Independent             General bed mobility comments: Extra time and effort only    Transfers Overall transfer level: Needs assistance Equipment used: Rolling walker (2 wheeled) Transfers: Sit to/from Stand Sit to Stand: Min guard         General transfer comment: Good eccentric and concentric control and stability  Ambulation/Gait Ambulation/Gait assistance: Min assist Gait Distance (Feet): 30 Feet Assistive device: Rolling walker (2 wheeled) Gait Pattern/deviations: Step-through pattern;Decreased step length - right;Decreased step length - left;Decreased stance time - right Gait velocity: decreased   General Gait Details: Multiple instances of R hand coming off of the walker handle; pt was able to place her R hand back on the grip but required extra time and cuing to locate her hand visually and then place it back on the walker correctly; pt required min A for stability several times secondary to mild R lateral lean  Stairs            Wheelchair Mobility    Modified Rankin (Stroke Patients Only)       Balance Overall balance assessment: Needs assistance   Sitting balance-Leahy Scale: Good     Standing balance support: Bilateral upper extremity supported;During functional activity Standing balance-Leahy Scale: Poor Standing balance comment: Min A occasionally during ambulation  Pertinent Vitals/Pain Pain Assessment: No/denies pain    Home Living Family/patient expects to be discharged to:: Private residence Living Arrangements: Alone Available Help at  Discharge: Family;Available 24 hours/day Type of Home: House Home Access: Ramped entrance     Home Layout: One level Home Equipment: Walker - 2 wheels;Walker - 4 wheels;Cane - quad;Bedside commode Additional Comments: Dtr in room provided history secondary to pt with expressive difficulty    Prior Function Level of Independence: Independent with assistive device(s)         Comments: Mod Ind amb with a rollator in the home and a QC in the community, Ind with ADLs, takes sponge baths, no recent fall history; two daughters and other family are able to cover 24 hr supervision if needed     Hand Dominance   Dominant Hand: Right    Extremity/Trunk Assessment   Upper Extremity Assessment Upper Extremity Assessment: Generalized weakness;Defer to OT evaluation;RUE deficits/detail;LUE deficits/detail RUE Deficits / Details: Poor RUE grip strength, very poor fine/gross motor coordination; mild inattention to the R hand RUE Coordination: decreased fine motor;decreased gross motor LUE Deficits / Details: Mildly decreased fine and gross motor coordination but significantly better than the RUE; LUE elbow and hand strength WFL    Lower Extremity Assessment Lower Extremity Assessment: Generalized weakness;RLE deficits/detail;LLE deficits/detail RLE Deficits / Details: RLE strength grossly 4/5 and equal L/R RLE Coordination: decreased gross motor LLE Deficits / Details: LLE strength grossly 4/5 and equal L/R LLE Coordination: WNL       Communication   Communication: Expressive difficulties  Cognition Arousal/Alertness: Awake/alert Behavior During Therapy: WFL for tasks assessed/performed Overall Cognitive Status: Difficult to assess                                        General Comments      Exercises Total Joint Exercises Ankle Circles/Pumps: AROM;Strengthening;Both;10 reps Quad Sets: Strengthening;Both;10 reps Gluteal Sets: 10 reps;Both;Strengthening Hip  ABduction/ADduction: Strengthening;Both;10 reps (with manual resistance) Straight Leg Raises: Strengthening;Both;10 reps Long Arc Quad: Strengthening;Both;5 reps Knee Flexion: 5 reps;Both;Strengthening Marching in Standing: AROM;Strengthening;Both;5 reps;Standing Other Exercises Other Exercises: HEP education with pt and dtr for BLE APs, QS, and GS x 10 each every 1-2 hours daily   Assessment/Plan    PT Assessment Patient needs continued PT services  PT Problem List Decreased strength;Decreased activity tolerance;Decreased balance;Decreased mobility;Decreased coordination;Decreased knowledge of use of DME       PT Treatment Interventions DME instruction;Gait training;Functional mobility training;Therapeutic activities;Therapeutic exercise;Balance training;Patient/family education    PT Goals (Current goals can be found in the Care Plan section)  Acute Rehab PT Goals PT Goal Formulation: Patient unable to participate in goal setting Time For Goal Achievement: 01/01/21 Potential to Achieve Goals: Good    Frequency 7X/week   Barriers to discharge Inaccessible home environment      Co-evaluation               AM-PAC PT "6 Clicks" Mobility  Outcome Measure Help needed turning from your back to your side while in a flat bed without using bedrails?: A Little Help needed moving from lying on your back to sitting on the side of a flat bed without using bedrails?: A Little Help needed moving to and from a bed to a chair (including a wheelchair)?: A Little Help needed standing up from a chair using your arms (e.g., wheelchair or bedside chair)?: A Little Help needed to  walk in hospital room?: A Little Help needed climbing 3-5 steps with a railing? : A Lot 6 Click Score: 17    End of Session Equipment Utilized During Treatment: Gait belt Activity Tolerance: Patient tolerated treatment well Patient left: in bed;with call bell/phone within reach;with family/visitor present;Other  (comment) (R and L bed rails up as pt was found in ICU) Nurse Communication: Mobility status PT Visit Diagnosis: Unsteadiness on feet (R26.81);Hemiplegia and hemiparesis;Muscle weakness (generalized) (M62.81);Difficulty in walking, not elsewhere classified (R26.2) Hemiplegia - Right/Left: Right Hemiplegia - dominant/non-dominant: Dominant Hemiplegia - caused by: Cerebral infarction    Time: 1500-1545 PT Time Calculation (min) (ACUTE ONLY): 45 min   Charges:   PT Evaluation $PT Eval Moderate Complexity: 1 Mod PT Treatments $Therapeutic Exercise: 8-22 mins        D. Royetta Asal PT, DPT 12/19/20, 4:33 PM

## 2020-12-20 ENCOUNTER — Inpatient Hospital Stay: Payer: Medicare Other

## 2020-12-20 DIAGNOSIS — I63512 Cerebral infarction due to unspecified occlusion or stenosis of left middle cerebral artery: Principal | ICD-10-CM

## 2020-12-20 DIAGNOSIS — R1030 Lower abdominal pain, unspecified: Secondary | ICD-10-CM

## 2020-12-20 LAB — BASIC METABOLIC PANEL
Anion gap: 8 (ref 5–15)
BUN: 53 mg/dL — ABNORMAL HIGH (ref 8–23)
CO2: 24 mmol/L (ref 22–32)
Calcium: 8.3 mg/dL — ABNORMAL LOW (ref 8.9–10.3)
Chloride: 110 mmol/L (ref 98–111)
Creatinine, Ser: 1.05 mg/dL — ABNORMAL HIGH (ref 0.44–1.00)
GFR, Estimated: 51 mL/min — ABNORMAL LOW (ref >60)
Glucose, Bld: 266 mg/dL — ABNORMAL HIGH (ref 70–99)
Potassium: 4.6 mmol/L (ref 3.5–5.1)
Sodium: 142 mmol/L (ref 135–145)

## 2020-12-20 LAB — GLUCOSE, CAPILLARY
Glucose-Capillary: 298 mg/dL — ABNORMAL HIGH (ref 70–99)
Glucose-Capillary: 262 mg/dL — ABNORMAL HIGH (ref 70–99)
Glucose-Capillary: 228 mg/dL — ABNORMAL HIGH (ref 70–99)
Glucose-Capillary: 151 mg/dL — ABNORMAL HIGH (ref 70–99)

## 2020-12-20 MED ORDER — FREE WATER
100.0000 mL | Freq: Four times a day (QID) | Status: DC
  Administered 2020-12-20 – 2020-12-22 (×7): 100 mL

## 2020-12-20 MED ORDER — PROSOURCE TF PO LIQD
45.0000 mL | Freq: Every day | ORAL | Status: DC
  Administered 2020-12-21 – 2020-12-22 (×2): 45 mL
  Filled 2020-12-20 (×2): qty 45

## 2020-12-20 MED ORDER — HYDROCODONE-ACETAMINOPHEN 7.5-325 MG/15ML PO SOLN: 5.0000 mL | Freq: Four times a day (QID) | ORAL | Status: DC | PRN

## 2020-12-20 MED ORDER — ONDANSETRON HCL 4 MG/2ML IJ SOLN
4.0000 mg | Freq: Four times a day (QID) | INTRAMUSCULAR | Status: DC | PRN
  Administered 2020-12-20: 4 mg via INTRAVENOUS
  Filled 2020-12-20: qty 2

## 2020-12-20 MED ORDER — KATE FARMS STANDARD 1.4 PO LIQD
160.0000 mL | Freq: Three times a day (TID) | ORAL | Status: DC
  Administered 2020-12-20 – 2020-12-21 (×3): 160 mL
  Filled 2020-12-20: qty 325

## 2020-12-20 MED ORDER — FREE WATER
50.0000 mL | Freq: Three times a day (TID) | Status: DC
  Administered 2020-12-20 – 2020-12-22 (×6): 50 mL

## 2020-12-20 MED ORDER — INSULIN ASPART 100 UNIT/ML ~~LOC~~ SOLN
0.0000 [IU] | SUBCUTANEOUS | Status: DC
  Administered 2020-12-20: 2 [IU] via SUBCUTANEOUS
  Administered 2020-12-20: 3 [IU] via SUBCUTANEOUS
  Administered 2020-12-21: 01:00:00 2 [IU] via SUBCUTANEOUS
  Administered 2020-12-21: 05:00:00 1 [IU] via SUBCUTANEOUS
  Administered 2020-12-21 (×2): 2 [IU] via SUBCUTANEOUS
  Administered 2020-12-21 – 2020-12-22 (×2): 3 [IU] via SUBCUTANEOUS
  Filled 2020-12-20 (×8): qty 1

## 2020-12-20 MED ORDER — MORPHINE SULFATE (PF) 2 MG/ML IV SOLN
1.0000 mg | INTRAVENOUS | Status: DC | PRN
  Administered 2020-12-20: 1 mg via INTRAVENOUS
  Filled 2020-12-20: qty 1

## 2020-12-20 MED ORDER — CARVEDILOL 3.125 MG PO TABS
3.1250 mg | ORAL_TABLET | Freq: Two times a day (BID) | ORAL | Status: DC
  Administered 2020-12-20 – 2020-12-22 (×4): 3.125 mg
  Filled 2020-12-20 (×4): qty 1

## 2020-12-20 MED ORDER — POLYETHYLENE GLYCOL 3350 17 G PO PACK
17.0000 g | PACK | Freq: Every day | ORAL | Status: DC
  Administered 2020-12-20 – 2020-12-22 (×3): 17 g
  Filled 2020-12-20 (×3): qty 1

## 2020-12-20 NOTE — Progress Notes (Signed)
Neurology Progress Note  Patient ID: JAMITA MCKELVIN Day is a 87 y.o. with past medical history significant for hypertension, hyperlipidemia, type 2 diabetes, pulmonary hypertension, meningioma, cervical spine stenosis s/p C-spine fusion, congestive heart failure, arrhythmia, severe esophageal stricture s/p feeding tube, sinus arrhythmia not on anticoagulation.   Initially consulted for: Code stroke  Major interval events:  - restarted on Coreg - SBP stable  Subjective: - Nausea, mild headache, generally feeling unwell this morning, fatigued, chest pain which resolved   Exam: Current vital signs: BP (!) 168/75   Pulse 92   Temp 97.9 F (36.6 C)   Resp 19   Ht 5\' 7"  (1.702 m)   Wt 55.1 kg   SpO2 95%   BMI 19.03 kg/m  Vital signs in last 24 hours: Temp:  [97.9 F (36.6 C)-98.9 F (37.2 C)] 97.9 F (36.6 C) (03/22 0800) Pulse Rate:  [75-92] 92 (03/22 0800) Resp:  [12-25] 19 (03/22 0800) BP: (134-185)/(47-121) 168/75 (03/22 0800) SpO2:  [90 %-97 %] 95 % (03/22 0800)   Gen: In bed, appears fatigued and unwell  Resp: Mildly tachypneac, able to tolerate laying flat Cardiac: Perfusing extremities well, RRR  Abd: soft, nt  Neuro: MS: Today she is much more slowly follow commands.  She is able to name simple things but cannot repeat.  Fluency of speech is slower than yesterday. Attempt to state daughter's name is too garbled to process, but then is able to accurately answer yes/no questions about her daughter's name.  With some delay she is able to show me her thumb on command and follows other simple commands  CN: Pupils equal and round, face with much improved very mild right facial droop, EOMI, orients to stimuli in all visual fields, tongue midline Motor: Symmetrically moves legs antigravity without drift. Right arm is notable for mild drift.  Coordination: High-five intact bilaterally   Pertinent Data:  Cr 1.1 today, stable from  1.1 on 3/20  BNP 982.2 Initial troponin 16,  then 92 on 3/19  Lab Results  Component Value Date   VITAMINB12 960 (H) 11/23/2019    Lab Results  Component Value Date   HGBA1C 6.9 (H) 12/18/2020   Lab Results  Component Value Date   CHOL 178 12/18/2020   HDL 56 12/18/2020   LDLCALC 112 (H) 12/18/2020   TRIG 51 12/18/2020   CHOLHDL 3.2 12/18/2020   Lab Results  Component Value Date   TSH 4.466 11/23/2019    Head CT with hypodensity in the superior left MCA territory CTA with multifocal stenoses especially in the left MCA territory (which trifurcates) and a fair burden of atherosclerosis throughout CTA/CT perfusion rapid read as 14 mL core infarct and 28 mL of area at risk, likely slightly underestimated and slightly overestimated on my personal review when comparing dry head CT hypodensity to CT perfusion studies MRI brain personally reviewed, full radiology read pending.  Infarct correlating to hypodensity seen on head CT, with some petechial hemorrhage.  ECHO:  1. Left ventricular ejection fraction, by estimation, is 35 to 40%. The  left ventricle has moderately decreased function. The left ventricle  demonstrates global hypokinesis. The left ventricular internal cavity size  was mildly dilated. There is mild  left ventricular hypertrophy. Left ventricular diastolic parameters are  consistent with Grade II diastolic dysfunction (pseudonormalization).  2. Right ventricular systolic function is normal. The right ventricular  size is normal.  3. Left atrial size was moderately dilated.  4. Right atrial size was mildly dilated.  5. The mitral valve is normal in structure. Moderate to severe mitral  valve regurgitation.  6. The aortic valve is calcified. Aortic valve regurgitation is mild.  Moderate aortic valve stenosis.   Impression: This is an 87 year old woman with multiple vascular risk factors as detailed above, presenting with a left MCA territory stroke out of the window.  Given her examination is improved  today with a higher blood pressure at the time of my examination (systolic blood pressure of 170s), I do think she may still be perfusion dependent.  Recommendations: # Left MCA territory stroke, likely atheroembolic, cannot rule out cardioembolic at this time - Continue 81 mg daily aspirin lifelong - Plavix 300 mg load with 75 mg daily for 90 day course  - Unfortunately does not tolerate statin due to muscle pain, referred to advanced lipid clinic for consideration of PSK 9 inhibitor (order placed) - Risk factor modification - Telemetry monitoring; event monitor on discharge if no arrythmias captured  - Blood pressure goal: No changes for now, to be updated pending STAT head CT today - PT consult, OT consult, Speech consult - Neurology to follow  # Chest pain and generalized fatigue - Appreciate primary team workup and management  Lesleigh Noe MD-PhD [facility] [phone]

## 2020-12-20 NOTE — Progress Notes (Signed)
Inpatient Rehab Admissions Coordinator:   Consult received.  I met with pt and both of her daughters at the bedside to discuss goals/expectations of CIR stay.  We discussed goals of supervision level, especially for speech, and with estimated length of stay to be about 12-16 days.  I answered their questions and we discussed process of insurance authorization.  I will begin request today and expect to hear back in about 24 hours.  I will keep them updated once I hear from insurance.    Shann Medal, PT, DPT Admissions Coordinator [phone] 12/20/20  4:09 PM

## 2020-12-20 NOTE — Progress Notes (Signed)
Nutrition Follow Up Note   DOCUMENTATION CODES:   Not applicable  INTERVENTION:   Change to [employer] 1.4 Standard formula- 3 cans daily via tube- Flush tube with 49m of water before and after each feed  Additional free water flushes 1061m4 times daily via tube   Regimen provides 1405kcal/day, 71g/day protein and 140259may free water   NUTRITION DIAGNOSIS:   Moderate Malnutrition related to chronic illness (esophageal stricture) as evidenced by energy intake < or equal to 75% for > or equal to 1 month,mild fat depletion,moderate fat depletion,mild muscle depletion,moderate muscle depletion.  GOAL:   Patient will meet greater than or equal to 90% of their needs  -met with tube feeds  MONITOR:   Labs,Weight trends,TF tolerance,Skin,I & O's  ASSESSMENT:   89 8o female with h/o PAD, iron deficiency anemia, HLD, T2 DM, meningioma, HTN, CHF and esophageal stricture requiring chronic G tube who is admitted with L MCA stroke  Met with pt and pt's daughter in room today. Pt receiving Jevity 1.5 at 77m48m. Pt reports abdominal pain and nausea with continuous feeds. Pt reports chronic diarrhea at home that happens almost immediately after feeds. Pt has not yet had a BM today but did have diarrhea yesterday. RD suspects pt's diarrhea is r/t the high osmolality of the standard tube feeding formulas as pt also reports chronic diarrhea and intolerance to Osmolite in the past. RD will switch pt [facility] to [facility] formula as this has lower Osmolality and is higher in soluble fiber. RD will monitor for intolerance.    Medications reviewed and include: aspirin, plavix, lasix, insulin, protonix, miralax  Labs reviewed: K 4.6 wnl, BUN 53(H), creat 1.05(H) P 3.5 wnl, Mg 2.7(H) Hgb 10.1(L), Hct 31.2(L) cbgs- 298, 262 x 24 hrs AIC 6.9(L)- 3/20  Diet Order:   Diet Order            Diet NPO time specified  Diet effective now                EDUCATION NEEDS:   Education needs  have been addressed  Skin:  Skin Assessment: Reviewed RN Assessment  Last BM:  3/21- type 7  Height:   Ht Readings from Last 1 Encounters:  12/17/20 _0  (1.702 m)    Weight:   Wt Readings from Last 1 Encounters:  12/19/20 55.1 kg    Ideal Body Weight:  61.4 kg  BMI:  Body mass index is 19.03 kg/m.  Estimated Nutritional Needs:   Kcal:  1400-1600kcal/day  Protein:  70-80g/day  Fluid:  > 1.4 L  CaseKoleen Distance RD, LDN Please refer to [facility] and/or RD on-call/weekend/after hours pager

## 2020-12-20 NOTE — Progress Notes (Signed)
OT Cancellation Note  Patient Details Name: [name] MRN: [id number] DOB: 1928-12-03   Cancelled Treatment:    Reason Eval/Treat Not Completed: Patient at procedure or test/ unavailable. Pt out for CT. Will re-attempt OT tx at later time as pt is available.  Hanley Hays, MPH, MS, OTR/L ascom [phone] 12/20/20, 10:33 AM

## 2020-12-20 NOTE — Consult Note (Signed)
Physical Medicine and Rehabilitation Consult  Reason for Consult: Impaired mobility and ADLs.  Referring Physician: Loletha Grayer, MD   HPI: [name] is a 87 y.o. female who presented to the ED with her daughter with complaints of malaise, confusion, and weakness and was found to have a left LCA stroke, acute hypoxic respiratory failure secondary to flash pulmonary edema, and hypertension. PMH includes anemia, aortic valve sclerosis, esophageal stricture, CHF, and brain tumor. Physical Medicine & Rehabilitation was consulted to assess candidacy for CIR.    Review of Systems  Constitutional: Positive for malaise/fatigue.  Eyes: Negative.   Cardiovascular: Negative.   Gastrointestinal: Positive for diarrhea.  Genitourinary: Negative.   Neurological: Positive for weakness.  Endo/Heme/Allergies: Negative.   Psychiatric/Behavioral: Negative.    Past Medical History:  Diagnosis Date  . Anemia    IRON INFUSIONS  . Aortic valve sclerosis   . Arrhythmia   . Arthritis    osteoarthritis  . Basal cell carcinoma of skin   . Brain tumor ([place])   . Brain tumor ([place])   . Cervical spine disease   . CHF (congestive heart failure) ([place])   . Diabetes mellitus without complication ([place])   . Dysrhythmia    sinus arrhythmia  . Esophageal stricture    severe, led to feeding tube placement in Sept 2019  . Esophageal ulcer without bleeding   . Gastrostomy tube dependent ([place])    DOES NOT EAT OR DRINK   . GERD (gastroesophageal reflux disease)   . History of kidney stones   . Hyperlipemia   . Hypertension   . Kidney stones   . Leaky heart valve   . Meningioma ([place])   . 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 Pulmonary hypertension ([place])   . RLS (restless legs syndrome)   . Vertigo    Past Surgical History:  Procedure Laterality Date  . ABDOMINAL HYSTERECTOMY    . APPENDECTOMY    . ARTHROGRAM KNEE Left   . BACK SURGERY     CERVIVAL NECK FUSION  . CATARACT EXTRACTION  W/PHACO Left 11/11/2018   Procedure: CATARACT EXTRACTION PHACO AND INTRAOCULAR LENS PLACEMENT (IOC) LEFT, DIABETIC;  Surgeon: Birder Robson, MD;  Location: [facility] ORS;  Service: Ophthalmology;  Laterality: Left;  [place]  00:41 CDE 6.41 Fluid pack lot # [id number]  . COLONOSCOPY WITH PROPOFOL N/A 06/20/2017   Procedure: COLONOSCOPY WITH PROPOFOL;  Surgeon: Lollie Sails, MD;  Location: [facility] ENDOSCOPY;  Service: Endoscopy;  Laterality: N/A;  . ESOPHAGOGASTRODUODENOSCOPY (EGD) WITH PROPOFOL N/A 03/14/2015   Procedure: ESOPHAGOGASTRODUODENOSCOPY (EGD) WITH PROPOFOL;  Surgeon: Josefine Class, MD;  Location: [facility] ENDOSCOPY;  Service: Endoscopy;  Laterality: N/A;  . ESOPHAGOGASTRODUODENOSCOPY (EGD) WITH PROPOFOL N/A 03/28/2017   Procedure: ESOPHAGOGASTRODUODENOSCOPY (EGD) WITH PROPOFOL;  Surgeon: Lollie Sails, MD;  Location: [facility] ENDOSCOPY;  Service: Endoscopy;  Laterality: N/A;  . ESOPHAGOGASTRODUODENOSCOPY (EGD) WITH PROPOFOL N/A 06/20/2017   Procedure: ESOPHAGOGASTRODUODENOSCOPY (EGD) WITH PROPOFOL;  Surgeon: Lollie Sails, MD;  Location: [facility] ENDOSCOPY;  Service: Endoscopy;  Laterality: N/A;  . ESOPHAGOGASTRODUODENOSCOPY (EGD) WITH PROPOFOL N/A 10/21/2017   Procedure: ESOPHAGOGASTRODUODENOSCOPY (EGD) WITH PROPOFOL;  Surgeon: Lollie Sails, MD;  Location: [facility] ENDOSCOPY;  Service: Endoscopy;  Laterality: N/A;  . ESOPHAGOGASTRODUODENOSCOPY (EGD) WITH PROPOFOL N/A 04/15/2018   Procedure: ESOPHAGOGASTRODUODENOSCOPY (EGD) WITH PROPOFOL;  Surgeon: Lollie Sails, MD;  Location: [facility] ENDOSCOPY;  Service: Endoscopy;  Laterality: N/A;  . ESOPHAGUS SURGERY     CLOSURE  . EYE SURGERY    .  HYSTERECTOMY ABDOMINAL WITH SALPINGECTOMY    . IR GASTROSTOMY TUBE MOD SED  06/11/2018  . IR GASTROSTOMY TUBE REMOVAL  03/25/2019  . IR REPLACE G-TUBE SIMPLE WO FLUORO  10/19/2020  . PERIPHERAL VASCULAR CATHETERIZATION N/A 01/23/2016   Procedure: Visceral Venography;  Surgeon: Algernon Huxley, MD;  Location:  [place] CV LAB;  Service: Cardiovascular;  Laterality: N/A;  . PERIPHERAL VASCULAR CATHETERIZATION  01/23/2016   Procedure: Peripheral Vascular Intervention;  Surgeon: Algernon Huxley, MD;  Location: [place] CV LAB;  Service: Cardiovascular;;  . UPPER ESOPHAGEAL ENDOSCOPIC ULTRASOUND (EUS) N/A 12/05/2017   Procedure: UPPER ESOPHAGEAL ENDOSCOPIC ULTRASOUND (EUS);  Surgeon: Jola Schmidt, MD;  Location: [facility] ENDOSCOPY;  Service: Endoscopy;  Laterality: N/A;  . VISCERAL ANGIOGRAPHY N/A 03/18/2017   Procedure: Visceral Angiography;  Surgeon: Algernon Huxley, MD;  Location: [place] CV LAB;  Service: Cardiovascular;  Laterality: N/A;  . VISCERAL ARTERY INTERVENTION N/A 03/18/2017   Procedure: Visceral Artery Intervention;  Surgeon: Algernon Huxley, MD;  Location: [place] CV LAB;  Service: Cardiovascular;  Laterality: N/A;   Family History  Problem Relation Age of Onset  . Stroke Mother   . Hypertension Mother   . Heart disease Mother   . Cancer Father   . Heart disease Father   . Heart attack Sister   . Diabetes Sister   . Heart attack Brother    Social History:  reports that she has never smoked. She has never used smokeless tobacco. She reports that she does not drink alcohol and does not use drugs. Allergies:  Allergies  Allergen Reactions  . Elemental Sulfur Diarrhea and Nausea And Vomiting  . Gabapentin Swelling  . Lipitor [Atorvastatin] Other (See Comments)    Muscle aches  . Mevacor [Lovastatin] Other (See Comments)    Muscle aches  . Milk-Related Compounds Other (See Comments)    Large quantities cause headaches   . Septra [Sulfamethoxazole-Trimethoprim] Other (See Comments)    Unknown  . Statins Other (See Comments)    Muscle pain  . Zocor [Simvastatin] Other (See Comments)    Muscle aches   Medications Prior to Admission  Medication Sig Dispense Refill  . acetaminophen (TYLENOL) 160 MG/5ML solution Place 20.3 mLs (650 mg total) into feeding tube every 6 (six)  hours as needed for mild pain. 120 mL 0  . allopurinol (ZYLOPRIM) 100 MG tablet Place 100 mg into feeding tube daily.    . Amino Acids-Protein Hydrolys (FEEDING SUPPLEMENT, PRO-STAT SUGAR FREE 64,) LIQD Place 30 mLs into feeding tube daily. 450 mL 1  . carvedilol (COREG) 6.25 MG tablet Take 6.25 mg by mouth 2 (two) times daily with a meal.    . clopidogrel (PLAVIX) 75 MG tablet GIVE 1 TABLET VIA FEEDING TUBE EVERY [name] AS DIRECTED (Patient taking differently: Take 75 mg by mouth daily.) 30 tablet 6  . dextromethorphan-guaiFENesin (MUCINEX DM) 30-600 MG 12hr tablet Take 1 tablet by mouth 2 (two) times daily as needed for cough.    . furosemide (LASIX) 10 MG/ML solution Place 60 mg into feeding tube daily.     Marland Kitchen glipiZIDE (GLUCOTROL) 5 MG tablet Place 1 tablet (5 mg total) into feeding tube daily before breakfast.    . hydrALAZINE (APRESOLINE) 25 MG tablet Take 25 mg by mouth 2 (two) times daily.    . Hypromellose 0.2 % SOLN Place 1 drop into both eyes 3 (three) times daily as needed (dry eyes).     Marland Kitchen lisinopril (ZESTRIL) 40 MG tablet Take 20  mg by mouth daily.     Marland Kitchen loperamide (IMODIUM) 1 MG/5ML solution Take 3 mg by mouth as needed for diarrhea or loose stools.    . meclizine (ANTIVERT) 25 MG tablet Place 25 mg into feeding tube as needed for dizziness.     . Multiple Vitamin (MULTIVITAMIN) LIQD Place 15 mLs into feeding tube daily. 450 mL 1  . Nutritional Supplements (FEEDING SUPPLEMENT, JEVITY 1.5 CAL/FIBER,) LIQD Place 237 mLs into feeding tube 4 (four) times daily. 237 mL 90  . Pedialyte (PEDIALYTE) SOLN Take by mouth in the morning, at noon, in the evening, and at bedtime.    Marland Kitchen rOPINIRole (REQUIP) 2 MG tablet Place 1 tablet (2 mg total) into feeding tube 2 (two) times daily. 60 tablet 1  . spironolactone (ALDACTONE) 25 MG tablet Take 25 mg by mouth daily.    . vitamin C (VITAMIN C) 250 MG tablet Place 1 tablet (250 mg total) into feeding tube 2 (two) times daily. 60 tablet 1  . Water For  Irrigation, Sterile (FREE WATER) SOLN Place 30 mLs into feeding tube every 4 (four) hours.      Home: Home Living Family/patient expects to be discharged to:: Private residence Living Arrangements: Alone Available Help at Discharge: Family,Available 24 hours/[name] (pt has 2 daughters, daughter present endorses ability to provide 24/7) Type of Home: House Home Access: [place]: One level Bathroom Shower/Tub: Chiropodist: Handicapped height [place]: Environmental consultant - 2 wheels,Walker - 4 wheels,Cane - quad,Bedside commode Additional Comments: Dtr in room provided history secondary to pt with expressive difficulty  Functional History: Prior Function Level of Independence: Independent with assistive device(s) Comments: Mod Ind amb with a rollator in the home and a QC in the community, Ind with ADL including Gtube feedings, takes sponge baths, no recent fall history; two daughters and other family are able to cover 24 hr supervision if needed Functional Status:  Mobility: Bed Mobility Overal bed mobility: Modified Independent General bed mobility comments: Extra time and effort only Transfers Overall transfer level: Needs assistance Equipment used: Rolling walker (2 wheeled) Transfers: Sit to/from Stand Sit to Stand: Min guard General transfer comment: Cues for hand placement Ambulation/Gait Ambulation/Gait assistance: Min guard Gait Distance (Feet): 45 Feet Assistive device: Rolling walker (2 wheeled) Gait Pattern/deviations: Step-through pattern,Decreased step length - right,Shuffle General Gait Details: Mod verbal cues to attend to R hand to ensure proper placement on the RW and for increased RLE step length and clearance during step-through Gait velocity: decreased    ADL: ADL Overall ADL's : Needs assistance/impaired Eating/Feeding Details (indicate cue type and reason): Gtube feeds at baseline Grooming: Sitting,Minimal assistance Upper  Body Bathing: Sitting,Minimal assistance Lower Body Bathing: Sit to/from stand,Minimal assistance Upper Body Dressing : Sitting,Minimal assistance Lower Body Dressing: Sit to/from stand,Minimal assistance Toilet Transfer: Minimal assistance,BSC,RW,Ambulation General ADL Comments: grossly MIN A for grooming, UB and LB ADL tasks, intermittent cues for RUE attention/safety  Cognition: Cognition Overall Cognitive Status: Difficult to assess Orientation Level: Oriented to person,Oriented to place,Oriented to situation,Disoriented to time Cognition Arousal/Alertness: Awake/alert Behavior During Therapy: WFL for tasks assessed/performed Overall Cognitive Status: Difficult to assess Area of Impairment: Problem solving Problem Solving: Slow processing,Requires verbal cues General Comments: impaired expressive language, cues intermittently for safety/sequencing Difficult to assess due to: Impaired communication  Blood pressure (!) 178/69, pulse 73, temperature 98.3 F (36.8 C), temperature source Oral, resp. rate 18, height 5\' 7"  (1.702 m), weight 55.1 kg, SpO2 98 %. Physical Exam  Gen: no  distress, normal appearing HEENT: oral mucosa pink and moist, NCAT Cardio: Reg rate Chest: normal effort, normal rate of breathing Abd: soft, non-distended Ext: no edema Psych: pleasant, normal affect Skin: intact Neuro: RUE drift. 4/5 strength throughout. Severe dysarthria. Slow to follow commands.   Results for orders placed or performed during the hospital encounter of 12/17/20 (from the past 24 hour(s))  Glucose, capillary     Status: Abnormal   Collection Time: 12/19/20  7:13 PM  Result Value Ref Range   Glucose-Capillary 212 (H) 70 - 99 mg/dL  Basic metabolic panel     Status: Abnormal   Collection Time: 12/20/20  4:14 AM  Result Value Ref Range   Sodium 142 135 - 145 mmol/L   Potassium 4.6 3.5 - 5.1 mmol/L   Chloride 110 98 - 111 mmol/L   CO2 24 22 - 32 mmol/L   Glucose, Bld 266 (H) 70 -  99 mg/dL   BUN 53 (H) 8 - 23 mg/dL   Creatinine, Ser 1.05 (H) 0.44 - 1.00 mg/dL   Calcium 8.3 (L) 8.9 - 10.3 mg/dL   GFR, Estimated 51 (L) >60 mL/min   Anion gap 8 5 - 15  Glucose, capillary     Status: Abnormal   Collection Time: 12/20/20  7:40 AM  Result Value Ref Range   Glucose-Capillary 298 (H) 70 - 99 mg/dL  Glucose, capillary     Status: Abnormal   Collection Time: 12/20/20 11:30 AM  Result Value Ref Range   Glucose-Capillary 262 (H) 70 - 99 mg/dL  Glucose, capillary     Status: Abnormal   Collection Time: 12/20/20  3:56 PM  Result Value Ref Range   Glucose-Capillary 228 (H) 70 - 99 mg/dL   CT HEAD WO CONTRAST  Result Date: 12/20/2020 CLINICAL DATA:  87 year old female status post left MCA infarct. EXAM: CT HEAD WITHOUT CONTRAST TECHNIQUE: Contiguous axial images were obtained from the base of the skull through the vertex without intravenous contrast. COMPARISON:  Brain MRI 12/18/2020 and earlier. FINDINGS: Brain: Cytotoxic edema centered at the posterior left operculum is stable from the prior CT and MRI. Mild regional mass effect. No associated hemorrhage. No midline shift or ventriculomegaly. Stable gray-white matter differentiation elsewhere. No new cytotoxic edema. Vascular: Extensive Calcified atherosclerosis at the skull base. Skull: No acute osseous abnormality identified. Upper cervical spine degeneration. Sinuses/Orbits: Visualized paranasal sinuses and mastoids are stable and well pneumatized. Other: No acute orbit or scalp soft tissue finding. Calcified scalp vessel atherosclerosis. IMPRESSION: 1. Stable CT appearance of the left MCA territory infarct. No hemorrhage or significant mass effect. 2. No new intracranial abnormality. Electronically Signed   By: Genevie Ann M.D.   On: 12/20/2020 11:00     Assessment/Plan: Diagnosis: Left MCA stroke 1. Does the need for close, 24 hr/[name] medical supervision in concert with the patient's rehab needs make it unreasonable for this  patient to be served in a less intensive setting? Yes 2. Co-Morbidities requiring supervision/potential complications:  1. Chest pain 2. Generalized fatigue 3. Type 2 DM: monitor CBGs AC/HS 4. Impaired mobility and ALDs: will require intense PT and OT 5. Severe dysarthria: will require intensive SLP 3. Due to bladder management, bowel management, safety, skin/wound care, disease management, medication administration, pain management and patient education, does the patient require 24 hr/[name] rehab nursing? Yes 4. Does the patient require coordinated care of a physician, rehab nurse, therapy disciplines of PT, OT, SLP to address physical and functional deficits in the context of the above  medical diagnosis(es)? Yes Addressing deficits in the following areas: balance, endurance, locomotion, strength, transferring, bowel/bladder control, bathing, dressing, feeding, grooming, toileting, cognition, speech, language and psychosocial support 5. Can the patient actively participate in an intensive therapy program of at least 3 hrs of therapy per [name] at least 5 days per week? Yes 6. The potential for patient to make measurable gains while on inpatient rehab is excellent 7. Anticipated functional outcomes upon discharge from inpatient rehab are S with OT, PT, and SLP 8. Estimated rehab length of stay to reach the above functional goals is: 9-11 days 9. Anticipated discharge destination: Home 10. Overall Rehab/Functional Prognosis: excellent  RECOMMENDATIONS: This patient's condition is appropriate for continued rehabilitative care in the following setting: CIR Patient has agreed to participate in recommended program. Yes Note that insurance prior authorization may be required for reimbursement for recommended care.  Comment: Thank you for this consult. Admission coordinator to follow.   I have personally performed a face to face diagnostic evaluation, including, but not limited to relevant history and  physical exam findings, of this patient and developed relevant assessment and plan.  Additionally, I have reviewed and concur with the physician assistant's documentation above.  Leeroy Cha, MD  Izora Ribas, MD 12/20/2020

## 2020-12-20 NOTE — Progress Notes (Signed)
Physical Therapy Treatment Patient Details Name: [name] Day MRN: [id number] DOB: 10-Feb-1929 Today's Date: 12/20/2020    History of Present Illness Pt is an 87 yo F presenting to the ED from home with her daughter with complaints of malaise, confusion & weakness.  MD assessment includes: acute left MCA stroke, acute hypoxic respiratory failure secondary to flash pulmonary edema, and HTN. PMH includes anemia, aortic valve sclerosis, arrhythmia, arthritis, basal cell carcinoma of skin, brain tumor, brain tumor, cervical spine disease, CHF, DM, dysrhythmia, esophageal stricture, esophageal ulcer without bleeding, gastrostomy tube dependent, GERD, history of kidney stones, hyperlipemia, hypertension, kidney stones, leaky heart valve, meningioma, occlusive mesenteric ischemia, pulmonary hypertension, RLS, and vertigo.    PT Comments    Pt was pleasant and motivated to participate during the session and put forth good effort throughout. Pt continued to present with significant deficits in expressive language but was able to follow commands well with occasional extra time or cuing to process.  Pt presented with grossly improved R hand grip strength as well as fine/gross motor coordination in her RUE although significant deficits remain.  Pt was able to ambulate increased distances this session with decreased physical assistance required although she continued to require significant cues to attend to her R hand to ensure proper placement on the RW.  Pt's SpO2 and HR were both WNL during the session.  Pt will benefit from PT services in an IR setting upon discharge to safely address deficits listed in patient problem list for decreased caregiver assistance and eventual return to PLOF.      Follow Up Recommendations  CIR     Equipment Recommendations  None recommended by PT    Recommendations for Other Services       Precautions / Restrictions Precautions Precautions: Fall Precaution Comments:  HOB > 30 deg Restrictions Weight Bearing Restrictions: No Other Position/Activity Restrictions: G-tube to LLQ    Mobility  Bed Mobility Overal bed mobility: Modified Independent             General bed mobility comments: Extra time and effort only    Transfers Overall transfer level: Needs assistance Equipment used: Rolling walker (2 wheeled) Transfers: Sit to/from Stand Sit to Stand: Min guard         General transfer comment: Cues for hand placement  Ambulation/Gait Ambulation/Gait assistance: Min guard Gait Distance (Feet): 45 Feet x 1, 20 Feet x 1 Assistive device: Rolling walker (2 wheeled) Gait Pattern/deviations: Step-through pattern;Decreased step length - right;Shuffle Gait velocity: decreased   General Gait Details: Mod verbal cues to attend to R hand to ensure proper placement on the RW and for increased RLE step length and clearance during step-through   Stairs             Wheelchair Mobility    Modified Rankin (Stroke Patients Only)       Balance Overall balance assessment: Needs assistance Sitting-balance support: Feet supported;No upper extremity supported Sitting balance-Leahy Scale: Fair Sitting balance - Comments: Some posterior instability while performing seated LE therex; good stability during static sitting at EOB Postural control: Posterior lean Standing balance support: Bilateral upper extremity supported;During functional activity Standing balance-Leahy Scale: Fair Standing balance comment: Min to mod lean on the RW for support in standing but no LOB this session                            Cognition Arousal/Alertness: Awake/alert Behavior During Therapy: [facility] for tasks  assessed/performed Overall Cognitive Status: Difficult to assess                                 General Comments: impaired expressive language, cues intermittently for safety/sequencing      Exercises Total Joint Exercises Ankle  Circles/Pumps: AROM;Strengthening;Both;10 reps;15 reps (with manual resistance) Quad Sets: Strengthening;Both;10 reps Towel Squeeze: Strengthening;Both;10 reps Short Arc Quad: Strengthening;Both;10 reps Hip ABduction/ADduction: Strengthening;Both;10 reps Straight Leg Raises: Strengthening;Both;10 reps Long Arc Quad: Strengthening;Both;10 reps (with manual resistance) Knee Flexion: Strengthening;Both;10 reps (with manual resistance) Marching in Standing: AROM;Strengthening;Both;5 reps;Standing Other Exercises Other Exercises: Dynamic seated balance training with reaching outside BOS Other Exercises: BUE fine and gross motor coordination training    General Comments        Pertinent Vitals/Pain Pain Assessment: No/denies pain    Home Living                      Prior Function            PT Goals (current goals can now be found in the care plan section) Progress towards PT goals: Progressing toward goals    Frequency    7X/week      PT Plan Current plan remains appropriate    Co-evaluation              AM-PAC PT "6 Clicks" Mobility   Outcome Measure  Help needed turning from your back to your side while in a flat bed without using bedrails?: A Little Help needed moving from lying on your back to sitting on the side of a flat bed without using bedrails?: A Little Help needed moving to and from a bed to a chair (including a wheelchair)?: A Little Help needed standing up from a chair using your arms (e.g., wheelchair or bedside chair)?: A Little Help needed to walk in hospital room?: A Little Help needed climbing 3-5 steps with a railing? : A Little 6 Click Score: 18    End of Session Equipment Utilized During Treatment: Gait belt Activity Tolerance: Patient tolerated treatment well Patient left: in bed;with nursing/sitter in room;Other (comment);with family/visitor present (Pt left in bed with nurse preparing for transport to new room) Nurse  Communication: Mobility status PT Visit Diagnosis: Unsteadiness on feet (R26.81);Hemiplegia and hemiparesis;Muscle weakness (generalized) (M62.81);Difficulty in walking, not elsewhere classified (R26.2) Hemiplegia - Right/Left: Right Hemiplegia - dominant/non-dominant: Dominant Hemiplegia - caused by: Cerebral infarction     Time: 9563-8756 PT Time Calculation (min) (ACUTE ONLY): 38 min  Charges:  $Gait Training: 8-22 mins $Therapeutic Exercise: 8-22 mins $Therapeutic Activity: 8-22 mins                     D. Scott Tanner PT, DPT 12/20/20, 5:19 PM

## 2020-12-20 NOTE — Progress Notes (Signed)
Report called to Caryl Pina, Therapist, sports. Pt will be transferring to room 126, family at bedside.

## 2020-12-20 NOTE — TOC Progression Note (Signed)
Transition of Care [facility]) - Progression Note    Patient Details  Name: [name] MRN: [id number] Date of Birth: Apr 18, 1929  [facility] CM/SW [place], [place] Phone Number: [phone] 12/20/2020, 3:39 PM  Clinical Narrative:     CSW spoke with patient's daughter [name] [name]-Dry [phone] with update on PT/OT recommendation. CSW role of TOC in patient care explained the estimated time line on CIR placement.  [name] stated she spoke with someone form CIR, but could not remember if it was Research officer, political party from [employer]  [name] stated she wanted to know if the patient would receive speech therapy in CIR and what the estimated ETA for CIR placement was.  CSW stated I would reach out to Attending and CIR RN and ask then to call her.  Ms [name]-Dry verbalized understanding.  Expected Discharge Plan:  (CIR) Barriers to Discharge: Continued Medical Work up  Expected Discharge Plan and Services Expected Discharge Plan:  (CIR) In-house Referral: Clinical Social Work     Living arrangements for the past 2 months: Single Family Home                                       Social Determinants of Health (SDOH) Interventions    Readmission Risk Interventions Readmission Risk Prevention Plan 11/24/2019  Transportation Screening Complete  PCP or Specialist Appt within 3-5 Days Complete  HRI or Home Care Consult Complete  Medication Review (RN Care Manager) Complete  Some recent data might be hidden

## 2020-12-20 NOTE — Progress Notes (Addendum)
Patient ID: [name] Day, female   DOB: 1929-03-27, 87 y.o.   MRN: [id number] [facility] PROGRESS NOTE  [name] Day BRA:[id number] DOB: May 29, 1929 DOA: 12/17/2020 PCP: [name], MD  HPI/Subjective: Patient has some lower abdominal pain.  States that she has constipation.  Has Foley in from admission.  Speech a little more difficult when her blood pressure is lower.  Initially admitted to the ICU with respiratory failure requiring BiPAP.  Patient was found to have a stroke.  Repeat CT scan today showed no changes  Objective: Vitals:   12/20/20 0800 12/20/20 1300  BP: (!) 168/75 (!) 152/74  Pulse: 92 70  Resp: 19 19  Temp: 97.9 F (36.6 C) 98.3 F (36.8 C)  SpO2: 95% 95%    Intake/Output Summary (Last 24 hours) at 12/20/2020 1454 Last data filed at 12/20/2020 1341 Gross per 24 hour  Intake 547.92 ml  Output 1600 ml  Net -1052.08 ml   Filed Weights   12/17/20 1227 12/19/20 0423  Weight: 56.7 kg 55.1 kg    ROS: Review of Systems  Respiratory: Negative for cough and shortness of breath.   Cardiovascular: Negative for chest pain.  Gastrointestinal: Positive for abdominal pain and constipation. Negative for nausea and vomiting.   Exam: Physical Exam HENT:     Head: Normocephalic.     Mouth/Throat:     Pharynx: No oropharyngeal exudate.  Eyes:     General: Lids are normal.     Conjunctiva/sclera: Conjunctivae normal.  Cardiovascular:     Rate and Rhythm: Normal rate and regular rhythm.     Heart sounds: Normal heart sounds, S1 normal and S2 normal.  Pulmonary:     Breath sounds: Normal breath sounds. No decreased breath sounds, wheezing, rhonchi or rales.  Abdominal:     Palpations: Abdomen is soft.     Tenderness: There is no abdominal tenderness.  Musculoskeletal:     Right ankle: No swelling.     Left ankle: No swelling.  Skin:    General: Skin is warm.     Findings: No rash.  Neurological:     Mental Status: She is alert.     Comments:  Able to straight leg raise bilaterally. Speech slurred when blood pressure lower       Data Reviewed: Basic Metabolic Panel: Recent Labs  Lab 12/17/20 1223 12/18/20 0444 12/19/20 0520 12/20/20 0414  NA 137 140 141 142  K 4.4 4.6 4.1 4.6  CL 105 105 109 110  CO2 25 27 24 24   GLUCOSE 188* 113* 130* 266*  BUN 43* 42* 42* 53*  CREATININE 0.88 1.11* 1.11* 1.05*  CALCIUM 8.8* 8.3* 8.3* 8.3*  MG  --  2.2 2.7*  --   PHOS  --  4.0 3.5  --    Liver Function Tests: Recent Labs  Lab 12/17/20 1223  AST 19  ALT 15  ALKPHOS 56  BILITOT 1.0  PROT 6.9  ALBUMIN 3.8    Recent Labs  Lab 12/17/20 1434  AMMONIA 19   CBC: Recent Labs  Lab 12/17/20 1223 12/18/20 0444 12/19/20 0520  WBC 9.0  8.8 7.1 7.3  NEUTROABS 7.1  --   --   HGB 10.3*  10.3* 9.8* 10.1*  HCT 32.5*  32.9* 30.2* 31.2*  MCV 91.3  91.1 91.0 91.2  PLT 325  329 289 282   BNP (last 3 results) Recent Labs    12/17/20 1223  BNP 982.2*    CBG: Recent Labs  Lab 12/19/20 1119 12/19/20 1558 12/19/20 1913 12/20/20 0740 12/20/20 1130  GLUCAP 105* 123* 212* 298* 262*    Recent Results (from the past 240 hour(s))  Culture, blood (routine x 2)     Status: None (Preliminary result)   Collection Time: 12/17/20  5:22 PM   Specimen: BLOOD  Result Value Ref Range Status   Specimen Description BLOOD BLOOD LEFT HAND  Final   Special Requests   Final    BOTTLES DRAWN AEROBIC AND ANAEROBIC Blood Culture results may not be optimal due to an inadequate volume of blood received in culture bottles   Culture   Final    NO GROWTH 3 DAYS Performed at [facility], [address], [place], [place] [address]    Report Status PENDING  Incomplete  Culture, blood (routine x 2)     Status: None (Preliminary result)   Collection Time: 12/17/20  5:33 PM   Specimen: BLOOD  Result Value Ref Range Status   Specimen Description BLOOD BLOOD LEFT ARM  Final   Special Requests AEROBIC BOTTLE ONLY Blood Culture  adequate volume  Final   Culture   Final    NO GROWTH 3 DAYS Performed at [facility], [address], [place], [place] [address]    Report Status PENDING  Incomplete  Resp Panel by RT-PCR (Flu A&B, Covid) Nasopharyngeal Swab     Status: None   Collection Time: 12/17/20  8:17 PM   Specimen: Nasopharyngeal Swab; Nasopharyngeal(NP) swabs in vial transport medium  Result Value Ref Range Status   SARS Coronavirus 2 by RT PCR NEGATIVE NEGATIVE Final    Comment: (NOTE) SARS-CoV-2 target nucleic acids are NOT DETECTED.  The SARS-CoV-2 RNA is generally detectable in upper respiratory specimens during the acute phase of infection. The lowest concentration of SARS-CoV-2 viral copies this assay can detect is 138 copies/mL. A negative result does not preclude SARS-Cov-2 infection and should not be used as the sole basis for treatment or other patient management decisions. A negative result may occur with  improper specimen collection/handling, submission of specimen other than nasopharyngeal swab, presence of viral mutation(s) within the areas targeted by this assay, and inadequate number of viral copies(<138 copies/mL). A negative result must be combined with clinical observations, patient history, and epidemiological information. The expected result is Negative.  Fact Sheet for Patients:  EntrepreneurPulse.com.au  Fact Sheet for Healthcare Providers:  IncredibleEmployment.be  This test is no t yet approved or cleared by the [place] FDA and  has been authorized for detection and/or diagnosis of SARS-CoV-2 by FDA under an Emergency Use Authorization (EUA). This EUA will remain  in effect (meaning this test can be used) for the duration of the COVID-19 declaration under Section 564(b)(1) of the Act, 21 U.S.C.section 360bbb-3(b)(1), unless the authorization is terminated  or revoked sooner.       Influenza A by PCR NEGATIVE NEGATIVE  Final   Influenza B by PCR NEGATIVE NEGATIVE Final    Comment: (NOTE) The Xpert Xpress SARS-CoV-2/FLU/RSV plus assay is intended as an aid in the diagnosis of influenza from Nasopharyngeal swab specimens and should not be used as a sole basis for treatment. Nasal washings and aspirates are unacceptable for Xpert Xpress SARS-CoV-2/FLU/RSV testing.  Fact Sheet for Patients: EntrepreneurPulse.com.au  Fact Sheet for Healthcare Providers: IncredibleEmployment.be  This test is not yet approved or cleared by the [place] FDA and has been authorized for detection and/or diagnosis of SARS-CoV-2 by FDA under an Emergency Use Authorization (EUA).  This EUA will remain in effect (meaning this test can be used) for the duration of the COVID-19 declaration under Section 564(b)(1) of the Act, 21 U.S.C. section 360bbb-3(b)(1), unless the authorization is terminated or revoked.  Performed at [facility], [address] [place], [place] [address]      Studies: CT HEAD WO CONTRAST  Result Date: 12/20/2020 CLINICAL DATA:  87 year old female status post left MCA infarct. EXAM: CT HEAD WITHOUT CONTRAST TECHNIQUE: Contiguous axial images were obtained from the base of the skull through the vertex without intravenous contrast. COMPARISON:  Brain MRI 12/18/2020 and earlier. FINDINGS: Brain: Cytotoxic edema centered at the posterior left operculum is stable from the prior CT and MRI. Mild regional mass effect. No associated hemorrhage. No midline shift or ventriculomegaly. Stable gray-white matter differentiation elsewhere. No new cytotoxic edema. Vascular: Extensive Calcified atherosclerosis at the skull base. Skull: No acute osseous abnormality identified. Upper cervical spine degeneration. Sinuses/Orbits: Visualized paranasal sinuses and mastoids are stable and well pneumatized. Other: No acute orbit or scalp soft tissue finding. Calcified scalp vessel  atherosclerosis. IMPRESSION: 1. Stable CT appearance of the left MCA territory infarct. No hemorrhage or significant mass effect. 2. No new intracranial abnormality. Electronically Signed   By: Genevie Ann M.D.   On: 12/20/2020 11:00    Scheduled Meds: . aspirin  81 mg Per Tube Daily  . carvedilol  6.25 mg Per Tube BID WC  . Chlorhexidine Gluconate Cloth  6 each Topical Daily  . clopidogrel  75 mg Per Tube Daily  . feeding supplement (KATE FARMS STANDARD 1.4)  160 mL Per Tube TID  . [START ON 12/21/2020] feeding supplement (PROSource TF)  45 mL Per Tube Daily  . free water  100 mL Per Tube Q6H  . free water  50 mL Per Tube TID  . furosemide  10 mg Per Tube Daily  . insulin aspart  0-9 Units Subcutaneous Q4H  . pantoprazole (PROTONIX) IV  40 mg Intravenous Q24H  . polyethylene glycol  17 g Per Tube Daily  . rOPINIRole  2 mg Per J Tube BID   Continuous Infusions: . sodium chloride Stopped (12/18/20 0819)   Brief history.  Patient was admitted by the critical care team on 12/17/2020 for acute hypoxic respiratory failure requiring BiPAP.  Patient was also found to have an acute stroke in the left frontoparietal region.  Patient has right-sided weakness.  Patient was also seen by neurosurgery for an aneurysm seen on CT angio.  No intervention recommended. Assessment/Plan:  1. Acute infarct in frontoparietal junction on the left with right-sided weakness.  Patient does have minimal petechial blood products on MRI.  Repeat CT scan of the head does not show any bleeding.  Continue aspirin and Plavix.  Physical therapy recommends CIR.  CIR evaluation.  Blood pressure needs to be on the higher side to perfuse her brain.  Patient has an allergy to statins.  LDL 112.  Prior to admission patient was independent living alone. 2. Acute systolic congestive heart failure.  Patient on low-dose Coreg and low-dose Lasix.  Echocardiogram shows an EF of 35 to 40%.  So far Limited with medications secondary to needing  blood pressure on the higher side. 3. Acute hypoxic respiratory failure initially with a pulse ox of 68% on room air.  She initially required BiPAP.  This has resolved and patient breathing comfortably on room air. 4. Type 2 diabetes mellitus.  Hemoglobin A1c 6.9.  Diet controlled.  Patient on sliding scale insulin  for right now. 5. Restless leg syndrome on Requip 6. Incidental aneurysm seen on imaging.  Seen by neurosurgery and recommended against intervention at this time. 7. Weakness from stroke.  Physical therapy recommending CIR. 8. Nutrition.  Restarted tube feeding yesterday. 9. Lower abdominal pain could be secondary to constipation or Foley catheter.  Will remove Foley catheter and give MiraLAX for constipation. 10. Chronic kidney disease stage IIIa 11. Moderate malnutrition        Code Status:     Code Status Orders  (From admission, onward)         Start     Ordered   12/17/20 1928  Full code  Continuous        12/17/20 1929        Code Status History    Date Active Date Inactive Code Status Order ID Comments User Context   11/23/2019 1331 11/26/2019 1713 Full Code [id number]  Para Skeans, MD ED   06/23/2018 1723 06/25/2018 1835 Full Code [id number]  Nicholes Mango, MD Inpatient   06/11/2018 1559 06/13/2018 1803 Full Code [id number]  Vaughan Basta, MD Inpatient   03/18/2017 1033 03/18/2017 1544 Full Code [id number]  Algernon Huxley, MD Inpatient   Advance Care Planning Activity    Advance Directive Documentation   Flowsheet Row Most Recent Value  Type of Advance Directive Healthcare Power of Attorney, Living will  Pre-existing out of facility DNR order (yellow form or pink MOST form) --  "MOST" Form in Place? --     Family Communication: Spoke with daughter at the bedside Disposition Plan: Status is: Inpatient  Dispo: The patient is from: Home              Anticipated d/c is to: CIR              Patient currently being followed closely for acute stroke.  Awaiting  to hear back from CIR.   Difficult to place patient.  No.  Time spent: 28 minutes  [place]

## 2020-12-20 NOTE — Progress Notes (Signed)
Inpatient Diabetes Program Recommendations  AACE/ADA: New Consensus Statement on Inpatient Glycemic Control (2015)  Target Ranges:  Prepandial:   less than 140 mg/dL      Peak postprandial:   less than 180 mg/dL (1-2 hours)      Critically ill patients:  140 - 180 mg/dL   Lab Results  Component Value Date   GLUCAP 262 (H) 12/20/2020   HGBA1C 6.9 (H) 12/18/2020    Review of Glycemic Control Results for [name], [name]" (MRN [id number]) as of 12/20/2020 12:16  Ref. Range 12/19/2020 11:19 12/19/2020 15:58 12/19/2020 19:13 12/20/2020 07:40 12/20/2020 11:30  Glucose-Capillary Latest Ref Range: 70 - 99 mg/dL 105 (H) 123 (H) 212 (H) 298 (H) 262 (H)   Diabetes history: DM Outpatient Diabetes medications: Glucotrol 5 mg daily Current orders for Inpatient glycemic control:  Novolog sensitive correction tid with meals and HS Jevity 55 ml/hr Inpatient Diabetes Program Recommendations:    Please consider increasing frequency of Novolog correction to q 4 hours since patient is on tube feeds.   Thanks  Adah Perl, RN, BC-ADM Inpatient Diabetes Coordinator Pager [phone] (8a-5p)

## 2020-12-20 NOTE — TOC Progression Note (Signed)
Transition of Care [facility]) - Progression Note    Patient Details  Name: [name] Day MRN: [id number] Date of Birth: [birth year]  Transition of Care [facility]) CM/SW [place], [place] Phone Number: [phone] 12/20/2020, 1:42 PM  Clinical Narrative:     PT/OT recommending CIR, CSW contacted [facility] PT, CIR for update.       Expected Discharge Plan and Services                                                 Social Determinants of Health (SDOH) Interventions    Readmission Risk Interventions Readmission Risk Prevention Plan 11/24/2019  Transportation Screening Complete  PCP or Specialist Appt within 3-5 Days Complete  HRI or Home Care Consult Complete  Medication Review (RN Care Manager) Complete  Some recent data might be hidden

## 2020-12-21 LAB — GLUCOSE, CAPILLARY
Glucose-Capillary: 117 mg/dL — ABNORMAL HIGH (ref 70–99)
Glucose-Capillary: 122 mg/dL — ABNORMAL HIGH (ref 70–99)
Glucose-Capillary: 131 mg/dL — ABNORMAL HIGH (ref 70–99)
Glucose-Capillary: 168 mg/dL — ABNORMAL HIGH (ref 70–99)
Glucose-Capillary: 185 mg/dL — ABNORMAL HIGH (ref 70–99)
Glucose-Capillary: 211 mg/dL — ABNORMAL HIGH (ref 70–99)

## 2020-12-21 LAB — URINALYSIS, COMPLETE (UACMP) WITH MICROSCOPIC
Bilirubin Urine: NEGATIVE
Glucose, UA: NEGATIVE mg/dL
Ketones, ur: NEGATIVE mg/dL
Nitrite: POSITIVE — AB
Protein, ur: 30 mg/dL — AB
Specific Gravity, Urine: 1.015 (ref 1.005–1.030)
WBC, UA: >50 WBC/hpf — ABNORMAL HIGH (ref 0–5)
pH: 7 (ref 5.0–8.0)

## 2020-12-21 LAB — BASIC METABOLIC PANEL
Anion gap: 7 (ref 5–15)
BUN: 55 mg/dL — ABNORMAL HIGH (ref 8–23)
CO2: 27 mmol/L (ref 22–32)
Calcium: 8.5 mg/dL — ABNORMAL LOW (ref 8.9–10.3)
Chloride: 108 mmol/L (ref 98–111)
Creatinine, Ser: 1.05 mg/dL — ABNORMAL HIGH (ref 0.44–1.00)
GFR, Estimated: 51 mL/min — ABNORMAL LOW (ref >60)
Glucose, Bld: 125 mg/dL — ABNORMAL HIGH (ref 70–99)
Potassium: 4.7 mmol/L (ref 3.5–5.1)
Sodium: 142 mmol/L (ref 135–145)

## 2020-12-21 MED ORDER — DOCUSATE SODIUM 50 MG/5ML PO LIQD
100.0000 mg | Freq: Two times a day (BID) | ORAL | Status: DC | PRN
  Filled 2020-12-21: qty 10

## 2020-12-21 MED ORDER — DOCUSATE SODIUM 50 MG/5ML PO LIQD
50.0000 mg | Freq: Two times a day (BID) | ORAL | Status: DC | PRN
  Filled 2020-12-21: qty 10

## 2020-12-21 MED ORDER — SENNOSIDES-DOCUSATE SODIUM 8.6-50 MG PO TABS
1.0000 | ORAL_TABLET | Freq: Two times a day (BID) | ORAL | Status: DC
  Administered 2020-12-21: 1 via JEJUNOSTOMY

## 2020-12-21 MED ORDER — DOCUSATE SODIUM 50 MG/5ML PO LIQD
50.0000 mg | Freq: Two times a day (BID) | ORAL | Status: DC
  Administered 2020-12-22: 10:00:00 50 mg
  Filled 2020-12-21 (×2): qty 10

## 2020-12-21 MED ORDER — SODIUM CHLORIDE 0.9 % IV SOLN
1.0000 g | INTRAVENOUS | Status: DC
  Administered 2020-12-21: 1 g via INTRAVENOUS
  Filled 2020-12-21: qty 1
  Filled 2020-12-21: qty 10

## 2020-12-21 MED ORDER — KATE FARMS STANDARD 1.4 PO LIQD
325.0000 mL | Freq: Three times a day (TID) | ORAL | Status: DC
  Administered 2020-12-21 – 2020-12-22 (×3): 325 mL
  Filled 2020-12-21: qty 325

## 2020-12-21 MED ORDER — POLYVINYL ALCOHOL 1.4 % OP SOLN
1.0000 [drp] | OPHTHALMIC | Status: DC | PRN
  Filled 2020-12-21: qty 15

## 2020-12-21 MED ORDER — HYPROMELLOSE (GONIOSCOPIC) 2.5 % OP SOLN: 1.0000 [drp] | Freq: Four times a day (QID) | OPHTHALMIC | Status: DC | PRN

## 2020-12-21 MED ORDER — SENNOSIDES 8.8 MG/5ML PO SYRP
5.0000 mL | ORAL_SOLUTION | Freq: Two times a day (BID) | ORAL | Status: DC
  Administered 2020-12-22: 10:00:00 5 mL
  Filled 2020-12-21 (×2): qty 5

## 2020-12-21 MED ORDER — ENOXAPARIN SODIUM 30 MG/0.3ML ~~LOC~~ SOLN: 30.0000 mg | SUBCUTANEOUS | Status: DC

## 2020-12-21 MED ORDER — SENNOSIDES-DOCUSATE SODIUM 8.6-50 MG PO TABS
1.0000 | ORAL_TABLET | Freq: Two times a day (BID) | ORAL | Status: DC
  Filled 2020-12-21: qty 1

## 2020-12-21 NOTE — H&P (Incomplete)
Physical Medicine and Rehabilitation Admission H&P    Chief Complaint  Patient presents with  . Weakness  . Altered Mental Status       : HPI: ***  ROS Past Medical History:  Diagnosis Date  . Anemia    IRON INFUSIONS  . Aortic valve sclerosis   . Arrhythmia   . Arthritis    osteoarthritis  . Basal cell carcinoma of skin   . Brain tumor ([place])   . Brain tumor ([place])   . Cervical spine disease   . CHF (congestive heart failure) ([place])   . Diabetes mellitus without complication ([place])   . Dysrhythmia    sinus arrhythmia  . Esophageal stricture    severe, led to feeding tube placement in Sept 2019  . Esophageal ulcer without bleeding   . Gastrostomy tube dependent ([place])    DOES NOT EAT OR DRINK   . GERD (gastroesophageal reflux disease)   . History of kidney stones   . Hyperlipemia   . Hypertension   . Kidney stones   . Leaky heart valve   . Meningioma ([place])   . 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 Pulmonary hypertension ([place])   . RLS (restless legs syndrome)   . Vertigo    Past Surgical History:  Procedure Laterality Date  . ABDOMINAL HYSTERECTOMY    . APPENDECTOMY    . ARTHROGRAM KNEE Left   . BACK SURGERY     CERVIVAL NECK FUSION  . CATARACT EXTRACTION W/PHACO Left 11/11/2018   Procedure: CATARACT EXTRACTION PHACO AND INTRAOCULAR LENS PLACEMENT (IOC) LEFT, DIABETIC;  Surgeon: Birder Robson, MD;  Location: [facility] ORS;  Service: Ophthalmology;  Laterality: Left;  [place]  00:41 CDE 6.41 Fluid pack lot # [id number]  . COLONOSCOPY WITH PROPOFOL N/A 06/20/2017   Procedure: COLONOSCOPY WITH PROPOFOL;  Surgeon: Lollie Sails, MD;  Location: [facility] ENDOSCOPY;  Service: Endoscopy;  Laterality: N/A;  . ESOPHAGOGASTRODUODENOSCOPY (EGD) WITH PROPOFOL N/A 03/14/2015   Procedure: ESOPHAGOGASTRODUODENOSCOPY (EGD) WITH PROPOFOL;  Surgeon: Josefine Class, MD;  Location: [facility] ENDOSCOPY;  Service: Endoscopy;  Laterality: N/A;  . ESOPHAGOGASTRODUODENOSCOPY (EGD) WITH  PROPOFOL N/A 03/28/2017   Procedure: ESOPHAGOGASTRODUODENOSCOPY (EGD) WITH PROPOFOL;  Surgeon: Lollie Sails, MD;  Location: [facility] ENDOSCOPY;  Service: Endoscopy;  Laterality: N/A;  . ESOPHAGOGASTRODUODENOSCOPY (EGD) WITH PROPOFOL N/A 06/20/2017   Procedure: ESOPHAGOGASTRODUODENOSCOPY (EGD) WITH PROPOFOL;  Surgeon: Lollie Sails, MD;  Location: [facility] ENDOSCOPY;  Service: Endoscopy;  Laterality: N/A;  . ESOPHAGOGASTRODUODENOSCOPY (EGD) WITH PROPOFOL N/A 10/21/2017   Procedure: ESOPHAGOGASTRODUODENOSCOPY (EGD) WITH PROPOFOL;  Surgeon: Lollie Sails, MD;  Location: [facility] ENDOSCOPY;  Service: Endoscopy;  Laterality: N/A;  . ESOPHAGOGASTRODUODENOSCOPY (EGD) WITH PROPOFOL N/A 04/15/2018   Procedure: ESOPHAGOGASTRODUODENOSCOPY (EGD) WITH PROPOFOL;  Surgeon: Lollie Sails, MD;  Location: [facility] ENDOSCOPY;  Service: Endoscopy;  Laterality: N/A;  . ESOPHAGUS SURGERY     CLOSURE  . EYE SURGERY    . HYSTERECTOMY ABDOMINAL WITH SALPINGECTOMY    . IR GASTROSTOMY TUBE MOD SED  06/11/2018  . IR GASTROSTOMY TUBE REMOVAL  03/25/2019  . IR REPLACE G-TUBE SIMPLE WO FLUORO  10/19/2020  . PERIPHERAL VASCULAR CATHETERIZATION N/A 01/23/2016   Procedure: Visceral Venography;  Surgeon: Algernon Huxley, MD;  Location: [place] CV LAB;  Service: Cardiovascular;  Laterality: N/A;  . PERIPHERAL VASCULAR CATHETERIZATION  01/23/2016   Procedure: Peripheral Vascular Intervention;  Surgeon: Algernon Huxley, MD;  Location: [place] CV LAB;  Service: Cardiovascular;;  . UPPER ESOPHAGEAL ENDOSCOPIC ULTRASOUND (EUS)  N/A 12/05/2017   Procedure: UPPER ESOPHAGEAL ENDOSCOPIC ULTRASOUND (EUS);  Surgeon: Jola Schmidt, MD;  Location: [facility] ENDOSCOPY;  Service: Endoscopy;  Laterality: N/A;  . VISCERAL ANGIOGRAPHY N/A 03/18/2017   Procedure: Visceral Angiography;  Surgeon: Algernon Huxley, MD;  Location: [place] CV LAB;  Service: Cardiovascular;  Laterality: N/A;  . VISCERAL ARTERY INTERVENTION N/A 03/18/2017   Procedure: Visceral  Artery Intervention;  Surgeon: Algernon Huxley, MD;  Location: [place] CV LAB;  Service: Cardiovascular;  Laterality: N/A;   Family History  Problem Relation Age of Onset  . Stroke Mother   . Hypertension Mother   . Heart disease Mother   . Cancer Father   . Heart disease Father   . Heart attack Sister   . Diabetes Sister   . Heart attack Brother    Social History:  reports that she has never smoked. She has never used smokeless tobacco. She reports that she does not drink alcohol and does not use drugs. Allergies:  Allergies  Allergen Reactions  . Elemental Sulfur Diarrhea and Nausea And Vomiting  . Gabapentin Swelling  . Lipitor [Atorvastatin] Other (See Comments)    Muscle aches  . Mevacor [Lovastatin] Other (See Comments)    Muscle aches  . Milk-Related Compounds Other (See Comments)    Large quantities cause headaches   . Septra [Sulfamethoxazole-Trimethoprim] Other (See Comments)    Unknown  . Statins Other (See Comments)    Muscle pain  . Zocor [Simvastatin] Other (See Comments)    Muscle aches   Medications Prior to Admission  Medication Sig Dispense Refill  . acetaminophen (TYLENOL) 160 MG/5ML solution Place 20.3 mLs (650 mg total) into feeding tube every 6 (six) hours as needed for mild pain. 120 mL 0  . allopurinol (ZYLOPRIM) 100 MG tablet Place 100 mg into feeding tube daily.    . Amino Acids-Protein Hydrolys (FEEDING SUPPLEMENT, PRO-STAT SUGAR FREE 64,) LIQD Place 30 mLs into feeding tube daily. 450 mL 1  . carvedilol (COREG) 6.25 MG tablet Take 6.25 mg by mouth 2 (two) times daily with a meal.    . clopidogrel (PLAVIX) 75 MG tablet GIVE 1 TABLET VIA FEEDING TUBE EVERY DAY AS DIRECTED (Patient taking differently: Take 75 mg by mouth daily.) 30 tablet 6  . dextromethorphan-guaiFENesin (MUCINEX DM) 30-600 MG 12hr tablet Take 1 tablet by mouth 2 (two) times daily as needed for cough.    . furosemide (LASIX) 10 MG/ML solution Place 60 mg into feeding tube daily.      Marland Kitchen glipiZIDE (GLUCOTROL) 5 MG tablet Place 1 tablet (5 mg total) into feeding tube daily before breakfast.    . hydrALAZINE (APRESOLINE) 25 MG tablet Take 25 mg by mouth 2 (two) times daily.    . Hypromellose 0.2 % SOLN Place 1 drop into both eyes 3 (three) times daily as needed (dry eyes).     Marland Kitchen lisinopril (ZESTRIL) 40 MG tablet Take 20 mg by mouth daily.     Marland Kitchen loperamide (IMODIUM) 1 MG/5ML solution Take 3 mg by mouth as needed for diarrhea or loose stools.    . meclizine (ANTIVERT) 25 MG tablet Place 25 mg into feeding tube as needed for dizziness.     . Multiple Vitamin (MULTIVITAMIN) LIQD Place 15 mLs into feeding tube daily. 450 mL 1  . Nutritional Supplements (FEEDING SUPPLEMENT, JEVITY 1.5 CAL/FIBER,) LIQD Place 237 mLs into feeding tube 4 (four) times daily. 237 mL 90  . Pedialyte (PEDIALYTE) SOLN Take by mouth in the morning,  at noon, in the evening, and at bedtime.    Marland Kitchen rOPINIRole (REQUIP) 2 MG tablet Place 1 tablet (2 mg total) into feeding tube 2 (two) times daily. 60 tablet 1  . spironolactone (ALDACTONE) 25 MG tablet Take 25 mg by mouth daily.    . vitamin C (VITAMIN C) 250 MG tablet Place 1 tablet (250 mg total) into feeding tube 2 (two) times daily. 60 tablet 1  . Water For Irrigation, Sterile (FREE WATER) SOLN Place 30 mLs into feeding tube every 4 (four) hours.      Drug Regimen Review { DRUG REGIMEN [id number]}  Home: Home Living Family/patient expects to be discharged to:: Private residence Living Arrangements: Alone Available Help at Discharge: Family,Available 24 hours/day (pt has 2 daughters, daughter present endorses ability to provide 24/7) Type of Home: House Home Access: Ramped entrance Home Layout: One level Bathroom Shower/Tub: Chiropodist: Handicapped height Home Equipment: Environmental consultant - 2 wheels,Walker - 4 wheels,Cane - quad,Bedside commode Additional Comments: Dtr in room provided history secondary to pt with expressive difficulty    Functional History: Prior Function Level of Independence: Independent with assistive device(s) Comments: Mod Ind amb with a rollator in the home and a QC in the community, Ind with ADL including Gtube feedings, takes sponge baths, no recent fall history; two daughters and other family are able to cover 24 hr supervision if needed  Functional Status:  Mobility: Bed Mobility Overal bed mobility: Modified Independent General bed mobility comments: Extra time and effort only Transfers Overall transfer level: Needs assistance Equipment used: Rolling walker (2 wheeled) Transfers: Sit to/from Stand Sit to Stand: Min guard General transfer comment: Cues for hand placement Ambulation/Gait Ambulation/Gait assistance: Min guard Gait Distance (Feet): 45 Feet Assistive device: Rolling walker (2 wheeled) Gait Pattern/deviations: Step-through pattern,Decreased step length - right,Shuffle General Gait Details: Mod verbal cues to attend to R hand to ensure proper placement on the RW and for increased RLE step length and clearance during step-through Gait velocity: decreased    ADL: ADL Overall ADL's : Needs assistance/impaired Eating/Feeding Details (indicate cue type and reason): Gtube feeds at baseline Grooming: Sitting,Minimal assistance Upper Body Bathing: Sitting,Minimal assistance Lower Body Bathing: Sit to/from stand,Minimal assistance Upper Body Dressing : Sitting,Minimal assistance Lower Body Dressing: Sit to/from stand,Minimal assistance Toilet Transfer: Minimal assistance,BSC,RW,Ambulation General ADL Comments: grossly MIN A for grooming, UB and LB ADL tasks, intermittent cues for RUE attention/safety  Cognition: Cognition Overall Cognitive Status: Difficult to assess Orientation Level: Oriented to person,Oriented to place,Disoriented to time,Disoriented to situation Cognition Arousal/Alertness: Awake/alert Behavior During Therapy: WFL for tasks assessed/performed Overall  Cognitive Status: Difficult to assess Area of Impairment: Problem solving Problem Solving: Slow processing,Requires verbal cues General Comments: impaired expressive language, cues intermittently for safety/sequencing Difficult to assess due to: Impaired communication  Physical Exam: Blood pressure (!) 152/62, pulse 70, temperature 98.2 F (36.8 C), temperature source Oral, resp. rate 18, height 5\' 7"  (1.702 m), weight 50.8 kg, SpO2 95 %. Physical Exam  Results for orders placed or performed during the hospital encounter of 12/17/20 (from the past 48 hour(s))  Glucose, capillary     Status: Abnormal   Collection Time: 12/19/20  3:58 PM  Result Value Ref Range   Glucose-Capillary 123 (H) 70 - 99 mg/dL    Comment: Glucose reference range applies only to samples taken after fasting for at least 8 hours.  Glucose, capillary     Status: Abnormal   Collection Time: 12/19/20  7:13 PM  Result Value Ref  Range   Glucose-Capillary 212 (H) 70 - 99 mg/dL    Comment: Glucose reference range applies only to samples taken after fasting for at least 8 hours.  Basic metabolic panel     Status: Abnormal   Collection Time: 12/20/20  4:14 AM  Result Value Ref Range   Sodium 142 135 - 145 mmol/L   Potassium 4.6 3.5 - 5.1 mmol/L   Chloride 110 98 - 111 mmol/L   CO2 24 22 - 32 mmol/L   Glucose, Bld 266 (H) 70 - 99 mg/dL    Comment: Glucose reference range applies only to samples taken after fasting for at least 8 hours.   BUN 53 (H) 8 - 23 mg/dL   Creatinine, Ser 1.05 (H) 0.44 - 1.00 mg/dL   Calcium 8.3 (L) 8.9 - 10.3 mg/dL   GFR, Estimated 51 (L) >60 mL/min    Comment: (NOTE) Calculated using the CKD-EPI Creatinine Equation (2021)    Anion gap 8 5 - 15    Comment: Performed at [facility], [address] [place], [place] [address]  Glucose, capillary     Status: Abnormal   Collection Time: 12/20/20  7:40 AM  Result Value Ref Range   Glucose-Capillary 298 (H) 70 - 99 mg/dL    Comment:  Glucose reference range applies only to samples taken after fasting for at least 8 hours.  Glucose, capillary     Status: Abnormal   Collection Time: 12/20/20 11:30 AM  Result Value Ref Range   Glucose-Capillary 262 (H) 70 - 99 mg/dL    Comment: Glucose reference range applies only to samples taken after fasting for at least 8 hours.  Glucose, capillary     Status: Abnormal   Collection Time: 12/20/20  3:56 PM  Result Value Ref Range   Glucose-Capillary 228 (H) 70 - 99 mg/dL    Comment: Glucose reference range applies only to samples taken after fasting for at least 8 hours.  Glucose, capillary     Status: Abnormal   Collection Time: 12/20/20  8:44 PM  Result Value Ref Range   Glucose-Capillary 151 (H) 70 - 99 mg/dL    Comment: Glucose reference range applies only to samples taken after fasting for at least 8 hours.  Glucose, capillary     Status: Abnormal   Collection Time: 12/21/20 12:36 AM  Result Value Ref Range   Glucose-Capillary 185 (H) 70 - 99 mg/dL    Comment: Glucose reference range applies only to samples taken after fasting for at least 8 hours.  Basic metabolic panel     Status: Abnormal   Collection Time: 12/21/20  5:00 AM  Result Value Ref Range   Sodium 142 135 - 145 mmol/L   Potassium 4.7 3.5 - 5.1 mmol/L   Chloride 108 98 - 111 mmol/L   CO2 27 22 - 32 mmol/L   Glucose, Bld 125 (H) 70 - 99 mg/dL    Comment: Glucose reference range applies only to samples taken after fasting for at least 8 hours.   BUN 55 (H) 8 - 23 mg/dL   Creatinine, Ser 1.05 (H) 0.44 - 1.00 mg/dL   Calcium 8.5 (L) 8.9 - 10.3 mg/dL   GFR, Estimated 51 (L) >60 mL/min    Comment: (NOTE) Calculated using the CKD-EPI Creatinine Equation (2021)    Anion gap 7 5 - 15    Comment: Performed at [facility], [address] [place], [place] [address]  Glucose, capillary     Status:  Abnormal   Collection Time: 12/21/20  5:16 AM  Result Value Ref Range   Glucose-Capillary 131 (H) 70 - 99  mg/dL    Comment: Glucose reference range applies only to samples taken after fasting for at least 8 hours.  Glucose, capillary     Status: Abnormal   Collection Time: 12/21/20  8:16 AM  Result Value Ref Range   Glucose-Capillary 122 (H) 70 - 99 mg/dL    Comment: Glucose reference range applies only to samples taken after fasting for at least 8 hours.   CT HEAD WO CONTRAST  Result Date: 12/20/2020 CLINICAL DATA:  87 year old female status post left MCA infarct. EXAM: CT HEAD WITHOUT CONTRAST TECHNIQUE: Contiguous axial images were obtained from the base of the skull through the vertex without intravenous contrast. COMPARISON:  Brain MRI 12/18/2020 and earlier. FINDINGS: Brain: Cytotoxic edema centered at the posterior left operculum is stable from the prior CT and MRI. Mild regional mass effect. No associated hemorrhage. No midline shift or ventriculomegaly. Stable gray-white matter differentiation elsewhere. No new cytotoxic edema. Vascular: Extensive Calcified atherosclerosis at the skull base. Skull: No acute osseous abnormality identified. Upper cervical spine degeneration. Sinuses/Orbits: Visualized paranasal sinuses and mastoids are stable and well pneumatized. Other: No acute orbit or scalp soft tissue finding. Calcified scalp vessel atherosclerosis. IMPRESSION: 1. Stable CT appearance of the left MCA territory infarct. No hemorrhage or significant mass effect. 2. No new intracranial abnormality. Electronically Signed   By: Genevie Ann M.D.   On: 12/20/2020 11:00       Medical Problem List and Plan: 1.  *** secondary to ***  -patient may *** shower  -ELOS/Goals: *** 2.  Antithrombotics: -DVT/anticoagulation:  {VTE PROPHYLAXIS/ANTICOAGULATION - [id number]}  -antiplatelet therapy: *** 3. Pain Management: *** 4. Mood: ***  -antipsychotic agents: *** 5. Neuropsych: This patient *** capable of making decisions on *** own behalf. 6. Skin/Wound Care: *** 7. Fluids/Electrolytes/Nutrition:  ***     ***  Bary Leriche, PA-C 12/21/2020

## 2020-12-21 NOTE — Progress Notes (Addendum)
PROGRESS NOTE    [name] Day  ZJI:[id number] DOB: 05/30/1929 DOA: 12/17/2020 PCP: [name], MD   Brief Narrative: Patient was admitted by critical care team on 12/17/2020 for acute hypoxic respiratory failure requiring BiPAP.  Patient was also found to have acute stroke in the left frontoparietal region.  Patient has right-sided weakness.  Patient was also evaluated by neurosurgery for an aneurysm is seen on CT angio, no intervention recommended at this time.     Assessment & Plan:   Active Problems:   Type 2 diabetes mellitus with hyperlipidemia (HCC)   Acute respiratory failure (HCC)   Cerebrovascular accident (CVA) due to occlusion of left middle cerebral artery (HCC)   Right sided weakness   Weakness   Abdominal pain, lower  1-Acute infarct frontoparietal junction of the left with right-sided weakness: Left MCA infarct.  -Patient has minimal petechial blood on MRI.  Repeated CT scan head is stable. -Patient was a started on aspirin and Plavix recommended by neurologist. -LDL 112, not on statins due to allergies to statins. -Awaiting CIR  2-Acute on chronic  Systolic Heart Failure,, need to follow-up with cardiology as an outpatient. -Continue with Coreg and low-dose Lasix. -Ejection fraction 35 to 40%. Prior Ef 25 % -Needs to follow up with cardiology.   3-Acute hypoxic respiratory failure, initially required BiPAP, oxygen saturation was 68 on room air. This has resolved. Received IV lasix initially.  Presume secondary to Acute systolic HF exacerbation.   4-Diabetes type 2: Hemoglobin A1c 6.9, diet controlled.  A sliding scale while inpatient.  5-Restless leg syndrome: on Requip 6-Incidental aneurysm seen on imaging: Evaluated by neurosurgery recommended against intervention at this time.  7-Nutrition: Continue with tube feedings.  Patient has had PEG tube for almost 3 years.  She had PEG tube for esophageal stricture, amenable to dilation.  8-Chronic  kidney disease a stage IIIa: Monitor Moderate malnutrition: Continue with tube feedings Constipation: Continue with MiraLAX and Senokot UTI; Was complaining of dysuria. UA with positive nitrates and more than 50 WBC> start IV antibiotics, ceftriaxone.       Nutrition Problem: Moderate Malnutrition Etiology: chronic illness (esophageal stricture)    Signs/Symptoms: energy intake < or equal to 75% for > or equal to 1 month,mild fat depletion,moderate fat depletion,mild muscle depletion,moderate muscle depletion    Interventions: Tube feeding  Estimated body mass index is 17.54 kg/m as calculated from the following:   Height as of this encounter: 5\' 7"  (1.702 m).   Weight as of this encounter: 50.8 kg.   DVT prophylaxis: Start Lovenox Code Status: Full code Family Communication: Daughter at bedside.  Disposition Plan:  Status is: Inpatient  Remains inpatient appropriate because:Inpatient level of care appropriate due to severity of illness   Dispo: The patient is from: Home              Anticipated d/c is to: CIR              Patient currently is medically stable to d/c.   Difficult to place patient No        Consultants:   Neurology.   Procedures:   ECHO  Antimicrobials:    Subjective: Alert, aphasic.  Daughter at bedside report patient has said a few more worlds today  Objective: Vitals:   12/21/20 0450 12/21/20 0500 12/21/20 0815 12/21/20 1200  BP: (!) 134/56  (!) 152/62 (!) 136/52  Pulse: 68  70 73  Resp: 14  18 20   Temp: (!) 97.5 F (  36.4 C)  98.2 F (36.8 C) 97.6 F (36.4 C)  TempSrc: Oral  Oral Oral  SpO2: 96%  95% 98%  Weight:  50.8 kg    Height:        Intake/Output Summary (Last 24 hours) at 12/21/2020 1443 Last data filed at 12/21/2020 0050 Gross per 24 hour  Intake 710 ml  Output 150 ml  Net 560 ml   Filed Weights   12/17/20 1227 12/19/20 0423 12/21/20 0500  Weight: 56.7 kg 55.1 kg 50.8 kg    Examination:  General exam:  Appears calm and comfortable  Respiratory system: Clear to auscultation. Respiratory effort normal. Cardiovascular system: S1 & S2 heard, RRR.  Gastrointestinal system: Abdomen is nondistended, soft and nontender. Peg tube in place.  Central nervous system: Alert aphasic Extremities: trace edema   Data Reviewed: I have personally reviewed following labs and imaging studies  CBC: Recent Labs  Lab 12/17/20 1223 12/18/20 0444 12/19/20 0520  WBC 9.0  8.8 7.1 7.3  NEUTROABS 7.1  --   --   HGB 10.3*  10.3* 9.8* 10.1*  HCT 32.5*  32.9* 30.2* 31.2*  MCV 91.3  91.1 91.0 91.2  PLT 325  329 289 259   Basic Metabolic Panel: Recent Labs  Lab 12/17/20 1223 12/18/20 0444 12/19/20 0520 12/20/20 0414 12/21/20 0500  NA 137 140 141 142 142  K 4.4 4.6 4.1 4.6 4.7  CL 105 105 109 110 108  CO2 25 27 24 24 27   GLUCOSE 188* 113* 130* 266* 125*  BUN 43* 42* 42* 53* 55*  CREATININE 0.88 1.11* 1.11* 1.05* 1.05*  CALCIUM 8.8* 8.3* 8.3* 8.3* 8.5*  MG  --  2.2 2.7*  --   --   PHOS  --  4.0 3.5  --   --    GFR: Estimated Creatinine Clearance: 29.1 mL/min (A) (by C-G formula based on SCr of 1.05 mg/dL (H)). Liver Function Tests: Recent Labs  Lab 12/17/20 1223  AST 19  ALT 15  ALKPHOS 56  BILITOT 1.0  PROT 6.9  ALBUMIN 3.8   No results for input(s): LIPASE, AMYLASE in the last 168 hours. Recent Labs  Lab 12/17/20 1434  AMMONIA 19   Coagulation Profile: Recent Labs  Lab 12/17/20 1223  INR 1.0   Cardiac Enzymes: No results for input(s): CKTOTAL, CKMB, CKMBINDEX, TROPONINI in the last 168 hours. BNP (last 3 results) No results for input(s): PROBNP in the last 8760 hours. HbA1C: No results for input(s): HGBA1C in the last 72 hours. CBG: Recent Labs  Lab 12/20/20 2044 12/21/20 0036 12/21/20 0516 12/21/20 0816 12/21/20 1330  GLUCAP 151* 185* 131* 122* 211*   Lipid Profile: No results for input(s): CHOL, HDL, LDLCALC, TRIG, CHOLHDL, LDLDIRECT in the last 72  hours. Thyroid Function Tests: No results for input(s): TSH, T4TOTAL, FREET4, T3FREE, THYROIDAB in the last 72 hours. Anemia Panel: No results for input(s): VITAMINB12, FOLATE, FERRITIN, TIBC, IRON, RETICCTPCT in the last 72 hours. Sepsis Labs: No results for input(s): PROCALCITON, LATICACIDVEN in the last 168 hours.  Recent Results (from the past 240 hour(s))  Culture, blood (routine x 2)     Status: None (Preliminary result)   Collection Time: 12/17/20  5:22 PM   Specimen: BLOOD  Result Value Ref Range Status   Specimen Description BLOOD BLOOD LEFT HAND  Final   Special Requests   Final    BOTTLES DRAWN AEROBIC AND ANAEROBIC Blood Culture results may not be optimal due to an inadequate volume of blood  received in culture bottles   Culture   Final    NO GROWTH 4 DAYS Performed at [facility], [address] [place], [place] [address]    Report Status PENDING  Incomplete  Culture, blood (routine x 2)     Status: None (Preliminary result)   Collection Time: 12/17/20  5:33 PM   Specimen: BLOOD  Result Value Ref Range Status   Specimen Description BLOOD BLOOD LEFT ARM  Final   Special Requests AEROBIC BOTTLE ONLY Blood Culture adequate volume  Final   Culture   Final    NO GROWTH 4 DAYS Performed at [facility], [address], [place], [place] [address]    Report Status PENDING  Incomplete  Resp Panel by RT-PCR (Flu A&B, Covid) Nasopharyngeal Swab     Status: None   Collection Time: 12/17/20  8:17 PM   Specimen: Nasopharyngeal Swab; Nasopharyngeal(NP) swabs in vial transport medium  Result Value Ref Range Status   SARS Coronavirus 2 by RT PCR NEGATIVE NEGATIVE Final    Comment: (NOTE) SARS-CoV-2 target nucleic acids are NOT DETECTED.  The SARS-CoV-2 RNA is generally detectable in upper respiratory specimens during the acute phase of infection. The lowest concentration of SARS-CoV-2 viral copies this assay can detect is 138 copies/mL. A negative result  does not preclude SARS-Cov-2 infection and should not be used as the sole basis for treatment or other patient management decisions. A negative result may occur with  improper specimen collection/handling, submission of specimen other than nasopharyngeal swab, presence of viral mutation(s) within the areas targeted by this assay, and inadequate number of viral copies(<138 copies/mL). A negative result must be combined with clinical observations, patient history, and epidemiological information. The expected result is Negative.  Fact Sheet for Patients:  EntrepreneurPulse.com.au  Fact Sheet for Healthcare Providers:  IncredibleEmployment.be  This test is no t yet approved or cleared by the [place] FDA and  has been authorized for detection and/or diagnosis of SARS-CoV-2 by FDA under an Emergency Use Authorization (EUA). This EUA will remain  in effect (meaning this test can be used) for the duration of the COVID-19 declaration under Section 564(b)(1) of the Act, 21 U.S.C.section 360bbb-3(b)(1), unless the authorization is terminated  or revoked sooner.       Influenza A by PCR NEGATIVE NEGATIVE Final   Influenza B by PCR NEGATIVE NEGATIVE Final    Comment: (NOTE) The Xpert Xpress SARS-CoV-2/FLU/RSV plus assay is intended as an aid in the diagnosis of influenza from Nasopharyngeal swab specimens and should not be used as a sole basis for treatment. Nasal washings and aspirates are unacceptable for Xpert Xpress SARS-CoV-2/FLU/RSV testing.  Fact Sheet for Patients: EntrepreneurPulse.com.au  Fact Sheet for Healthcare Providers: IncredibleEmployment.be  This test is not yet approved or cleared by the [place] FDA and has been authorized for detection and/or diagnosis of SARS-CoV-2 by FDA under an Emergency Use Authorization (EUA). This EUA will remain in effect (meaning this test can be used) for  the duration of the COVID-19 declaration under Section 564(b)(1) of the Act, 21 U.S.C. section 360bbb-3(b)(1), unless the authorization is terminated or revoked.  Performed at [facility], [address] [place], [place] [address]          Radiology Studies: CT HEAD WO CONTRAST  Result Date: 12/20/2020 CLINICAL DATA:  87 year old female status post left MCA infarct. EXAM: CT HEAD WITHOUT CONTRAST TECHNIQUE: Contiguous axial images were obtained from the base of the skull through the vertex without  intravenous contrast. COMPARISON:  Brain MRI 12/18/2020 and earlier. FINDINGS: Brain: Cytotoxic edema centered at the posterior left operculum is stable from the prior CT and MRI. Mild regional mass effect. No associated hemorrhage. No midline shift or ventriculomegaly. Stable gray-white matter differentiation elsewhere. No new cytotoxic edema. Vascular: Extensive Calcified atherosclerosis at the skull base. Skull: No acute osseous abnormality identified. Upper cervical spine degeneration. Sinuses/Orbits: Visualized paranasal sinuses and mastoids are stable and well pneumatized. Other: No acute orbit or scalp soft tissue finding. Calcified scalp vessel atherosclerosis. IMPRESSION: 1. Stable CT appearance of the left MCA territory infarct. No hemorrhage or significant mass effect. 2. No new intracranial abnormality. Electronically Signed   By: Genevie Ann M.D.   On: 12/20/2020 11:00        Scheduled Meds: . aspirin  81 mg Per Tube Daily  . carvedilol  3.125 mg Per Tube BID WC  . Chlorhexidine Gluconate Cloth  6 each Topical Daily  . clopidogrel  75 mg Per Tube Daily  . feeding supplement ([place] STANDARD 1.4)  325 mL Per Tube TID  . feeding supplement (PROSource TF)  45 mL Per Tube Daily  . free water  100 mL Per Tube Q6H  . free water  50 mL Per Tube TID  . furosemide  10 mg Per Tube Daily  . insulin aspart  0-9 Units Subcutaneous Q4H  . pantoprazole (PROTONIX) IV  40 mg  Intravenous Q24H  . polyethylene glycol  17 g Per Tube Daily  . rOPINIRole  2 mg Per J Tube BID  . senna-docusate  1 tablet Oral BID   Continuous Infusions: . sodium chloride Stopped (12/18/20 0819)     LOS: 4 days    Time spent: 35 minutes.     Elmarie Shiley, MD [facility]   If 7PM-7AM, please contact night-coverage [email or web address]  12/21/2020, 2:43 PM

## 2020-12-21 NOTE — Evaluation (Signed)
Speech Language Pathology Evaluation Patient Details Name: [name] Day MRN: [id number] DOB: 1929-06-23 Today's Date: 12/21/2020 Time: 9509-3267 SLP Time Calculation (min) (ACUTE ONLY): 15 min  Problem List:  Patient Active Problem List   Diagnosis Date Noted   Abdominal pain, lower    Cerebrovascular accident (CVA) due to occlusion of left middle cerebral artery (HCC)    Right sided weakness    Weakness    Acute respiratory failure ([place]) 12/17/2020   Respiratory failure with hypoxia ([place]) 11/23/2019   CHF exacerbation ([place]) 11/23/2019   HFrEF (heart failure with reduced ejection fraction) ([place]) 11/23/2019   GERD (gastroesophageal reflux disease)    Gastrostomy tube dependent ([place])    Esophageal ulcer without bleeding    Diabetes mellitus without complication ([place])    Anemia    Aortic valve disease 08/24/2019   Gastrostomy tube in place ([place]) 07/31/2019   RLS (restless legs syndrome) 07/31/2019   Chest pain, atypical 03/18/2019   Tricuspid regurgitation 02/13/2019   Heart palpitations 02/12/2019   Hyponatremia 06/23/2018   Protein-calorie malnutrition, severe 06/12/2018   Esophageal stricture 06/11/2018   Weight loss 05/29/2018   Dysphagia 05/29/2018   Esophageal stenosis 05/05/2018   Hyperlipemia 04/04/2018   Itching 01/08/2018   Medication monitoring encounter 01/08/2018   Paronychia of toe 01/08/2018   Esophagitis, CMV ([place]) 01/01/2018   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   Hypertension 04/30/2017   Mesenteric ischemia ([place]) 03/01/2017   Iron deficiency anemia 02/28/2017   PAD (peripheral artery disease) ([place]) 02/05/2017   Hyperlipidemia type II 04/04/2016   Pulmonary hypertension ([place]) 01/04/2015   Generalized OA 12/13/2014   Leg edema 12/13/2014   Aortic valve sclerosis 02/19/2014   Type 2 diabetes mellitus with hyperlipidemia ([place]) 12/25/2013   Acute, but ill-defined, cerebrovascular disease 12/25/2013    Benign essential hypertension 12/25/2013   Unilateral primary osteoarthritis, unspecified knee 12/25/2013   Personal history of other malignant neoplasm of skin 05/28/2012   Past Medical History:  Past Medical History:  Diagnosis Date   Anemia    IRON INFUSIONS   Aortic valve sclerosis    Arrhythmia    Arthritis    osteoarthritis   Basal cell carcinoma of skin    Brain tumor ([place])    Brain tumor ([place])    Cervical spine disease    CHF (congestive heart failure) ([employer])    Diabetes mellitus without complication ([place])    Dysrhythmia    sinus arrhythmia   Esophageal stricture    severe, led to feeding tube placement in Sept 2019   Esophageal ulcer without bleeding    Gastrostomy tube dependent ([place])    DOES NOT EAT OR DRINK    GERD (gastroesophageal reflux disease)    History of kidney stones    Hyperlipemia    Hypertension    Kidney stones    Leaky heart valve    Meningioma (HCC)    Occlusive mesenteric ischemia (HCC)    Pulmonary hypertension (HCC)    RLS (restless legs syndrome)    Vertigo    Past Surgical History:  Past Surgical History:  Procedure Laterality Date   ABDOMINAL HYSTERECTOMY     APPENDECTOMY     ARTHROGRAM KNEE Left    BACK SURGERY     CERVIVAL NECK FUSION   CATARACT EXTRACTION W/PHACO Left 11/11/2018   Procedure: CATARACT EXTRACTION PHACO AND INTRAOCULAR LENS PLACEMENT ([place]) LEFT, DIABETIC;  Surgeon: Birder Robson, MD;  Location: [facility] ORS;  Service: Ophthalmology;  Laterality: Left;  [place]  00:41 CDE  6.41 Fluid pack lot # [id number]   COLONOSCOPY WITH PROPOFOL N/A 06/20/2017   Procedure: COLONOSCOPY WITH PROPOFOL;  Surgeon: Lollie Sails, MD;  Location: [facility] ENDOSCOPY;  Service: Endoscopy;  Laterality: N/A;   ESOPHAGOGASTRODUODENOSCOPY (EGD) WITH PROPOFOL N/A 03/14/2015   Procedure: ESOPHAGOGASTRODUODENOSCOPY (EGD) WITH PROPOFOL;  Surgeon: Josefine Class, MD;  Location: [facility] ENDOSCOPY;  Service: Endoscopy;   Laterality: N/A;   ESOPHAGOGASTRODUODENOSCOPY (EGD) WITH PROPOFOL N/A 03/28/2017   Procedure: ESOPHAGOGASTRODUODENOSCOPY (EGD) WITH PROPOFOL;  Surgeon: Lollie Sails, MD;  Location: [facility] ENDOSCOPY;  Service: Endoscopy;  Laterality: N/A;   ESOPHAGOGASTRODUODENOSCOPY (EGD) WITH PROPOFOL N/A 06/20/2017   Procedure: ESOPHAGOGASTRODUODENOSCOPY (EGD) WITH PROPOFOL;  Surgeon: Lollie Sails, MD;  Location: [facility] ENDOSCOPY;  Service: Endoscopy;  Laterality: N/A;   ESOPHAGOGASTRODUODENOSCOPY (EGD) WITH PROPOFOL N/A 10/21/2017   Procedure: ESOPHAGOGASTRODUODENOSCOPY (EGD) WITH PROPOFOL;  Surgeon: Lollie Sails, MD;  Location: [facility] ENDOSCOPY;  Service: Endoscopy;  Laterality: N/A;   ESOPHAGOGASTRODUODENOSCOPY (EGD) WITH PROPOFOL N/A 04/15/2018   Procedure: ESOPHAGOGASTRODUODENOSCOPY (EGD) WITH PROPOFOL;  Surgeon: Lollie Sails, MD;  Location: [facility] ENDOSCOPY;  Service: Endoscopy;  Laterality: N/A;   ESOPHAGUS SURGERY     CLOSURE   EYE SURGERY     HYSTERECTOMY ABDOMINAL WITH SALPINGECTOMY     IR GASTROSTOMY TUBE MOD SED  06/11/2018   IR GASTROSTOMY TUBE REMOVAL  03/25/2019   IR REPLACE G-TUBE SIMPLE WO FLUORO  10/19/2020   PERIPHERAL VASCULAR CATHETERIZATION N/A 01/23/2016   Procedure: Visceral Venography;  Surgeon: Algernon Huxley, MD;  Location: [place] CV LAB;  Service: Cardiovascular;  Laterality: N/A;   PERIPHERAL VASCULAR CATHETERIZATION  01/23/2016   Procedure: Peripheral Vascular Intervention;  Surgeon: Algernon Huxley, MD;  Location: [place] CV LAB;  Service: Cardiovascular;;   UPPER ESOPHAGEAL ENDOSCOPIC ULTRASOUND (EUS) N/A 12/05/2017   Procedure: UPPER ESOPHAGEAL ENDOSCOPIC ULTRASOUND (EUS);  Surgeon: Jola Schmidt, MD;  Location: [facility] ENDOSCOPY;  Service: Endoscopy;  Laterality: N/A;   VISCERAL ANGIOGRAPHY N/A 03/18/2017   Procedure: Visceral Angiography;  Surgeon: Algernon Huxley, MD;  Location: [place] CV LAB;  Service: Cardiovascular;  Laterality: N/A;   VISCERAL  ARTERY INTERVENTION N/A 03/18/2017   Procedure: Visceral Artery Intervention;  Surgeon: Algernon Huxley, MD;  Location: [place] CV LAB;  Service: Cardiovascular;  Laterality: N/A;   HPI:  CECILE GILLISPIE Day is a 87 y.o. female who presented to the ED with her daughter with complaints of malaise, confusion, and weakness and was found to have a left LCA stroke, acute hypoxic respiratory failure secondary to flash pulmonary edema, and hypertension. PMH includes anemia, aortic valve sclerosis, esophageal stricture, CHF, and brain tumor.   Assessment / Plan / Recommendation Clinical Impression  Pt presents with severe impairments in verbal expression but relatively spared receptive language abilities. Pt's speech is garbled with very few distinguishable sounds. She is aware of her errors but is not able to self-correct or improve intelligibility thru imitation. She also displays some oral groping during oral motor tasks. It is difficult to determine word finding deficits vs. apraxia of speech at this time. She is able to use gestures to provide appropriate responses to yes/no questions and she demonstrates great selective attention as well as overall problem solving abilities. Skilled ST is of the utmost importance to provide further differential assessment of pt's expression as well as increase pt's ability to express basic wants and needs.    SLP Assessment  SLP Recommendation/Assessment: Patient needs continued Speech Lanaguage Pathology Services SLP Visit Diagnosis: Aphasia (R47.01);Apraxia (  R48.2)    Follow Up Recommendations  Inpatient Rehab    Frequency and Duration min 2x/week  2 weeks      SLP Evaluation Cognition  Overall Cognitive Status: Difficult to assess (d/t expressive deficits but overall appears functional) Arousal/Alertness: Awake/alert Orientation Level: Oriented to person;Oriented to place;Oriented to time;Oriented to situation (via yes/no questions) Attention:  Selective Selective Attention: Appears intact Memory: Appears intact       Comprehension  Auditory Comprehension Overall Auditory Comprehension: Appears within functional limits for tasks assessed Yes/No Questions: Within Functional Limits Visual Recognition/Discrimination Discrimination: Not tested Reading Comprehension Reading Status: Not tested    Expression Expression Primary Mode of Expression: Verbal Verbal Expression Overall Verbal Expression: Impaired Initiation: No impairment Level of Generative/Spontaneous Verbalization: Phrase Repetition: Impaired Level of Impairment: Word level Naming: Impairment Responsive: 0-25% accurate Confrontation: Impaired Convergent: 0-24% accurate Divergent: 0-24% accurate Verbal Errors: Aware of errors Pragmatics: No impairment Interfering Components: Speech intelligibility Non-Verbal Means of Communication: Not applicable Written Expression Dominant Hand: Right Written Expression: Not tested   Oral / Motor  Oral Motor/Sensory Function Overall Oral Motor/Sensory Function: Within functional limits Motor Speech Overall Motor Speech: Impaired Respiration: Within functional limits Phonation: Normal;Low vocal intensity Resonance: Within functional limits Articulation: Impaired Level of Impairment: Word Intelligibility: Intelligibility reduced Word: 0-24% accurate Phrase: 0-24% accurate Sentence: Not tested Conversation: Not tested Motor Planning: Impaired Level of Impairment: Word Motor Speech Errors: Aware;Inconsistent;Groping for words   GO                   Lazaro Isenhower B. Rutherford Nail M.S., CCC-SLP, Progress Office [phone]  Stormy Fabian 12/21/2020, 3:47 PM

## 2020-12-21 NOTE — Progress Notes (Signed)
PHARMACIST - PHYSICIAN COMMUNICATION  CONCERNING:  Enoxaparin (Lovenox) for DVT Prophylaxis   DESCRIPTION: Patient was prescribed enoxaprin 40mg  q24 hours for VTE prophylaxis.   Filed Weights   12/17/20 1227 12/19/20 0423 12/21/20 0500  Weight: 56.7 kg (125 lb) 55.1 kg (121 lb 7.6 oz) 50.8 kg (111 lb 15.9 oz)    Body mass index is 17.54 kg/m.  Estimated Creatinine Clearance: 29.1 mL/min (A) (by C-G formula based on SCr of 1.05 mg/dL (H)).   Patient is candidate for enoxaparin 30mg  every 24 hours based on CrCl <37ml/min or Weight <45kg  RECOMMENDATION: Pharmacy has adjusted enoxaparin dose per [facility] policy.  Patient is now receiving enoxaparin 30 mg every 24 hours    Darnelle Bos, PharmD Clinical Pharmacist  12/21/2020 2:49 PM

## 2020-12-21 NOTE — Evaluation (Signed)
Clinical/Bedside Swallow Evaluation Patient Details  Name: [name] Day MRN: [id number] Date of Birth: [birth year]  Today's Date: 12/21/2020 Time: SLP Start Time (ACUTE ONLY): 0945 SLP Stop Time (ACUTE ONLY): 1003 SLP Time Calculation (min) (ACUTE ONLY): 18 min  Past Medical History:  Past Medical History:  Diagnosis Date  . Anemia    IRON INFUSIONS  . Aortic valve sclerosis   . Arrhythmia   . Arthritis    osteoarthritis  . Basal cell carcinoma of skin   . Brain tumor ([place])   . Brain tumor ([place])   . Cervical spine disease   . CHF (congestive heart failure) ([place])   . Diabetes mellitus without complication ([place])   . Dysrhythmia    sinus arrhythmia  . Esophageal stricture    severe, led to feeding tube placement in Sept 2019  . Esophageal ulcer without bleeding   . Gastrostomy tube dependent ([place])    DOES NOT EAT OR DRINK   . GERD (gastroesophageal reflux disease)   . History of kidney stones   . Hyperlipemia   . Hypertension   . Kidney stones   . Leaky heart valve   . Meningioma ([place])   . Occlusive mesenteric ischemia ([place])   . Pulmonary hypertension ([place])   . RLS (restless legs syndrome)   . Vertigo    Past Surgical History:  Past Surgical History:  Procedure Laterality Date  . ABDOMINAL HYSTERECTOMY    . APPENDECTOMY    . ARTHROGRAM KNEE Left   . BACK SURGERY     CERVIVAL NECK FUSION  . CATARACT EXTRACTION W/PHACO Left 11/11/2018   Procedure: CATARACT EXTRACTION PHACO AND INTRAOCULAR LENS PLACEMENT (IOC) LEFT, DIABETIC;  Surgeon: Birder Robson, MD;  Location: [facility] ORS;  Service: Ophthalmology;  Laterality: Left;  [place]  00:41 CDE 6.41 Fluid pack lot # [id number]  . COLONOSCOPY WITH PROPOFOL N/A 06/20/2017   Procedure: COLONOSCOPY WITH PROPOFOL;  Surgeon: Lollie Sails, MD;  Location: [facility] ENDOSCOPY;  Service: Endoscopy;  Laterality: N/A;  . ESOPHAGOGASTRODUODENOSCOPY (EGD) WITH PROPOFOL N/A 03/14/2015   Procedure: ESOPHAGOGASTRODUODENOSCOPY (EGD) WITH  PROPOFOL;  Surgeon: Josefine Class, MD;  Location: [facility] ENDOSCOPY;  Service: Endoscopy;  Laterality: N/A;  . ESOPHAGOGASTRODUODENOSCOPY (EGD) WITH PROPOFOL N/A 03/28/2017   Procedure: ESOPHAGOGASTRODUODENOSCOPY (EGD) WITH PROPOFOL;  Surgeon: Lollie Sails, MD;  Location: [facility] ENDOSCOPY;  Service: Endoscopy;  Laterality: N/A;  . ESOPHAGOGASTRODUODENOSCOPY (EGD) WITH PROPOFOL N/A 06/20/2017   Procedure: ESOPHAGOGASTRODUODENOSCOPY (EGD) WITH PROPOFOL;  Surgeon: Lollie Sails, MD;  Location: [facility]COPY;  Service: Endoscopy;  Laterality: N/A;  . ESOPHAGOGASTRODUODENOSCOPY (EGD) WITH PROPOFOL N/A 10/21/2017   Procedure: ESOPHAGOGASTRODUODENOSCOPY (EGD) WITH PROPOFOL;  Surgeon: Lollie Sails, MD;  Location: [facility] ENDOSCOPY;  Service: Endoscopy;  Laterality: N/A;  . ESOPHAGOGASTRODUODENOSCOPY (EGD) WITH PROPOFOL N/A 04/15/2018   Procedure: ESOPHAGOGASTRODUODENOSCOPY (EGD) WITH PROPOFOL;  Surgeon: Lollie Sails, MD;  Location: [facility] ENDOSCOPY;  Service: Endoscopy;  Laterality: N/A;  . ESOPHAGUS SURGERY     CLOSURE  . EYE SURGERY    . HYSTERECTOMY ABDOMINAL WITH SALPINGECTOMY    . IR GASTROSTOMY TUBE MOD SED  06/11/2018  . IR GASTROSTOMY TUBE REMOVAL  03/25/2019  . IR REPLACE G-TUBE SIMPLE WO FLUORO  10/19/2020  . PERIPHERAL VASCULAR CATHETERIZATION N/A 01/23/2016   Procedure: Visceral Venography;  Surgeon: Algernon Huxley, MD;  Location: [place] CV LAB;  Service: Cardiovascular;  Laterality: N/A;  . PERIPHERAL VASCULAR CATHETERIZATION  01/23/2016   Procedure: Peripheral Vascular Intervention;  Surgeon: Algernon Huxley, MD;  Location: [place] CV LAB;  Service: Cardiovascular;;  . UPPER ESOPHAGEAL ENDOSCOPIC ULTRASOUND (EUS) N/A 12/05/2017   Procedure: UPPER ESOPHAGEAL ENDOSCOPIC ULTRASOUND (EUS);  Surgeon: Jola Schmidt, MD;  Location: [facility] ENDOSCOPY;  Service: Endoscopy;  Laterality: N/A;  . VISCERAL ANGIOGRAPHY N/A 03/18/2017   Procedure: Visceral Angiography;  Surgeon: Algernon Huxley,  MD;  Location: [place] CV LAB;  Service: Cardiovascular;  Laterality: N/A;  . VISCERAL ARTERY INTERVENTION N/A 03/18/2017   Procedure: Visceral Artery Intervention;  Surgeon: Algernon Huxley, MD;  Location: [place] CV LAB;  Service: Cardiovascular;  Laterality: N/A;   HPI:  [name] Day is a 87 y.o. female who presented to the ED with her daughter with complaints of malaise, confusion, and weakness and was found to have a left LCA stroke, acute hypoxic respiratory failure secondary to flash pulmonary edema, and hypertension. PMH includes anemia, aortic valve sclerosis, esophageal stricture, CHF, and brain tumor.   Assessment / Plan / Recommendation Clinical Impression  Pt has a chronic PEG d/t esophageal stricture, therefore she is NPO for consumption of food. However, at home pt consumes ice chips to improve moisture of oral cavity. She has not been experiencing any overt s/s of respiratory distress. Therefore, skilled observation was provided of pt consuming ice chips. Pt's appears to have swift swallow response and she is free of any overt s/s of aspiration at bedside. Pt and her daughter both endorse baseline ability commensurate with ability observed during this assessment. As such, pt should remain NPO but may have ice chips at bedside. Further ST intervention/assessment is not indicated for diet recommendation. SLP Visit Diagnosis: Dysphagia, pharyngoesophageal phase (R13.14)    Aspiration Risk  Mild aspiration risk    Diet Recommendation NPO (amy have ice chips as per baseline)   Supervision: Staff to assist with self feeding Postural Changes: Seated upright at 90 degrees;Remain upright for at least 30 minutes after po intake    Other  Recommendations Oral Care Recommendations: Oral care BID   Follow up Recommendations Inpatient Rehab      Frequency and Duration min 2x/week          Prognosis Prognosis for Safe Diet Advancement:  (diet advancement not indicated d/t  esophageal stricture)      Swallow Study   General Date of Onset: 12/17/20 HPI: ENA DEMARY Day is a 87 y.o. female who presented to the ED with her daughter with complaints of malaise, confusion, and weakness and was found to have a left LCA stroke, acute hypoxic respiratory failure secondary to flash pulmonary edema, and hypertension. PMH includes anemia, aortic valve sclerosis, esophageal stricture, CHF, and brain tumor. Type of Study: Bedside Swallow Evaluation Diet Prior to this Study: NPO Temperature Spikes Noted: No Respiratory Status: Room air History of Recent Intubation: No Behavior/Cognition: Alert;Cooperative;Pleasant mood Oral Cavity Assessment: Within Functional Limits Oral Care Completed by SLP: Recent completion by staff Oral Cavity - Dentition: Adequate natural dentition Vision: Functional for self-feeding Self-Feeding Abilities: Needs assist Patient Positioning: Upright in bed Baseline Vocal Quality: Normal Volitional Cough: Strong Volitional Swallow: Able to elicit    Oral/Motor/Sensory Function Overall Oral Motor/Sensory Function: Within functional limits   Ice Chips Ice chips: Within functional limits Presentation: Self Fed;Spoon   Thin Liquid Thin Liquid: Not tested    Nectar Thick Nectar Thick Liquid: Not tested   Honey Thick Honey Thick Liquid: Not tested   Puree Puree: Not tested   Solid     Solid: Not tested  Happi B. Rutherford Nail M.S., CCC-SLP, [place] [facility] [place] 12/21/2020,3:56 PM

## 2020-12-21 NOTE — Progress Notes (Signed)
Physical Therapy Treatment Patient Details Name: [name] Day MRN: [id number] DOB: [birth year] Today's Date: 12/21/2020    History of Present Illness Pt is an 87 yo F presenting to the ED from home with her daughter with complaints of malaise, confusion & weakness.  MD assessment includes: acute left MCA stroke, acute hypoxic respiratory failure secondary to flash pulmonary edema, and HTN. PMH includes anemia, aortic valve sclerosis, arrhythmia, arthritis, basal cell carcinoma of skin, brain tumor, brain tumor, cervical spine disease, CHF, DM, dysrhythmia, esophageal stricture, esophageal ulcer without bleeding, gastrostomy tube dependent, GERD, history of kidney stones, hyperlipemia, hypertension, kidney stones, leaky heart valve, meningioma, occlusive mesenteric ischemia, pulmonary hypertension, RLS, and vertigo.    PT Comments    Pt was pleasant and motivated to participate during the session and put forth good effort throughout.  Pt continued to require cuing to attend to her R hand during transfers and gait with occasional min A for R hand placement back onto the walker handle.  Pt required cues for general safety with ambulation.  These included cues to scan her environment to avoid obstacles and to keep her feet from getting tangled up in the walker leg during sharp turns.  Pt reported no adverse symptoms during the session with SpO2 and HR WNL throughout. Pt will benefit from PT services in an IR setting upon discharge to safely address deficits listed in patient problem list for decreased caregiver assistance and eventual return to PLOF.     Follow Up Recommendations  CIR     Equipment Recommendations  None recommended by PT    Recommendations for Other Services       Precautions / Restrictions Precautions Precautions: Fall Precaution Comments: HOB > 30 deg Restrictions Weight Bearing Restrictions: No Other Position/Activity Restrictions: G-tube to LLQ    Mobility  Bed  Mobility Overal bed mobility: Modified Independent             General bed mobility comments: Extra time and effort only    Transfers Overall transfer level: Needs assistance Equipment used: Rolling walker (2 wheeled) Transfers: Sit to/from Stand Sit to Stand: Min guard         General transfer comment: Cues for hand placement  Ambulation/Gait Ambulation/Gait assistance: Min guard Gait Distance (Feet): 100 Feet Assistive device: Rolling walker (2 wheeled) Gait Pattern/deviations: Step-through pattern;Decreased step length - right;Shuffle Gait velocity: decreased   General Gait Details: Mod verbal cues to attend to R hand to ensure proper placement on the RW and for increased RLE step length and amb closer to the RW with upright posture   Stairs             Wheelchair Mobility    Modified Rankin (Stroke Patients Only)       Balance Overall balance assessment: Needs assistance Sitting-balance support: Feet supported;No upper extremity supported;Single extremity supported Sitting balance-Leahy Scale: Good Sitting balance - Comments: pt challenged in static and dynamic sitting balance, no LOB, PRN cues for UE support   Standing balance support: Bilateral upper extremity supported;During functional activity Standing balance-Leahy Scale: Fair Standing balance comment: Min lean on the RW for support in standing but no LOB this session                            Cognition Arousal/Alertness: Awake/alert Behavior During Therapy: WFL for tasks assessed/performed Overall Cognitive Status: Difficult to assess Area of Impairment: Problem solving  Problem Solving: Slow processing;Requires verbal cues General Comments: cues for visual attention to R side, cues for exaggerated movements and going slowly to improve quality of movement      Exercises Total Joint Exercises Ankle Circles/Pumps:  AROM;Strengthening;Both;10 reps Quad Sets: Strengthening;Both;10 reps Gluteal Sets: 10 reps;Both;Strengthening Long Arc Quad: Strengthening;Both;10 reps Knee Flexion: Strengthening;Both;10 reps Other Exercises Other Exercises: Static standing balance training with alternating single UE support Other Exercises: HEP edcucation/review with pt and daughter for BLE APs, QS, and GS x 10 each every 1-2 hours daily Other Exercises: Dynamic seated balance training with reaching outsite BOS in all plans, crossing midline, bilateral midline tasks, weightbearing on RUE, and FMC table top tasks; improving with cues for visual attention, slowing down, exaggerated movements    General Comments        Pertinent Vitals/Pain Pain Assessment: No/denies pain    Home Living     Available Help at Discharge: Family;Available 24 hours/day Type of Home: House              Prior Function            PT Goals (current goals can now be found in the care plan section) Acute Rehab PT Goals Patient Stated Goal: do as much therapy as possible to get better Progress towards PT goals: Progressing toward goals    Frequency    7X/week      PT Plan Current plan remains appropriate    Co-evaluation              AM-PAC PT "6 Clicks" Mobility   Outcome Measure  Help needed turning from your back to your side while in a flat bed without using bedrails?: A Little Help needed moving from lying on your back to sitting on the side of a flat bed without using bedrails?: A Little Help needed moving to and from a bed to a chair (including a wheelchair)?: A Little Help needed standing up from a chair using your arms (e.g., wheelchair or bedside chair)?: A Little Help needed to walk in hospital room?: A Little Help needed climbing 3-5 steps with a railing? : A Little 6 Click Score: 18    End of Session Equipment Utilized During Treatment: Gait belt Activity Tolerance: Patient tolerated treatment  well Patient left: in chair;with call bell/phone within reach;with chair alarm set;with family/visitor present Nurse Communication: Mobility status PT Visit Diagnosis: Unsteadiness on feet (R26.81);Hemiplegia and hemiparesis;Muscle weakness (generalized) (M62.81);Difficulty in walking, not elsewhere classified (R26.2) Hemiplegia - Right/Left: Right Hemiplegia - dominant/non-dominant: Dominant Hemiplegia - caused by: Cerebral infarction     Time: 6759-1638 PT Time Calculation (min) (ACUTE ONLY): 28 min  Charges:  $Gait Training: 8-22 mins $Therapeutic Exercise: 8-22 mins                     D. Scott Tanner PT, DPT 12/21/20, 5:25 PM

## 2020-12-21 NOTE — Progress Notes (Signed)
Occupational Therapy Treatment Patient Details Name: [name] Day MRN: [id number] DOB: 1929/02/28 Today's Date: 12/21/2020    History of present illness Pt is an 87 yo F presenting to the ED from home with her daughter with complaints of malaise, confusion & weakness.  MD assessment includes: acute left MCA stroke, acute hypoxic respiratory failure secondary to flash pulmonary edema, and HTN. PMH includes anemia, aortic valve sclerosis, arrhythmia, arthritis, basal cell carcinoma of skin, brain tumor, brain tumor, cervical spine disease, CHF, DM, dysrhythmia, esophageal stricture, esophageal ulcer without bleeding, gastrostomy tube dependent, GERD, history of kidney stones, hyperlipemia, hypertension, kidney stones, leaky heart valve, meningioma, occlusive mesenteric ischemia, pulmonary hypertension, RLS, and vertigo.   OT comments  Pt seen for skilled OT services. Daughter present, pt eager to participate. Mod I for bed mobility. Pt performed seated grooming tasks and self feeding of ice chips with Min A for stability while using R hand to stabilize small cup, gross grasp of tissues, and washing face after wringing out washcloth with BUE. Pt instructed in dynamic seated balance training with reaching outsite BOS in all plans, crossing midline, bilateral midline tasks, weightbearing on RUE, and [place] table top tasks; improving with cues for visual attention, slowing down, exaggerated movements. Pt tolerated very well, pleased with her progress. Expressive language noted with mild improvement in addition to improvements noted with RUE [place], [place]. Pt continues to benefit from skilled high intensity OT services. Continues to be appropriate for CIR.    Follow Up Recommendations  CIR    Equipment Recommendations  Other (comment) (2WW)    Recommendations for Other Services      Precautions / Restrictions Precautions Precautions: Fall Precaution Comments: HOB > 30 deg Restrictions Weight Bearing  Restrictions: No Other Position/Activity Restrictions: G-tube to LLQ       Mobility Bed Mobility Overal bed mobility: Modified Independent             General bed mobility comments: Extra time and effort only    Transfers                      Balance Overall balance assessment: Needs assistance Sitting-balance support: Feet supported;No upper extremity supported;Single extremity supported Sitting balance-Leahy Scale: Good Sitting balance - Comments: pt challenged in static and dynamic sitting balance, no LOB, PRN cues for UE support                                   ADL either performed or assessed with clinical judgement   ADL Overall ADL's : Needs assistance/impaired Eating/Feeding: Sitting;Minimal assistance;Cueing for sequencing Eating/Feeding Details (indicate cue type and reason): Pt allowed to take ice chips. Seated EOB, pt able to grossly grasp small cup with R hand w/ cues for composite finger extension, visual attention to improve performance; pt able to sustain grasp Grooming: Sitting;Wash/dry hands;Wash/dry face;Set up;Supervision/safety;Cueing for sequencing Grooming Details (indicate cue type and reason): Seated EOB, pt grasped tissues, able to grossly wring out a washcloth and wash face with min-mod coordination deficits, improving with visual attention and cues                                     Vision Baseline Vision/History: Wears glasses Wears Glasses: At all times Patient Visual Report: No change from baseline     Perception  Praxis      Cognition Arousal/Alertness: Awake/alert Behavior During Therapy: WFL for tasks assessed/performed Overall Cognitive Status: Impaired/Different from baseline Area of Impairment: Problem solving                             Problem Solving: Slow processing;Requires verbal cues General Comments: cues for visual attention to R side, cues for exaggerated  movements and going slowly to improve quality of movement        Exercises Other Exercises Other Exercises: Dynamic seated balance training with reaching outsite BOS in all plans, crossing midline, bilateral midline tasks, weightbearing on RUE, and FMC table top tasks; improving with cues for visual attention, slowing down, exaggerated movements   Shoulder Instructions       General Comments      Pertinent Vitals/ Pain       Pain Assessment: No/denies pain  Home Living     Available Help at Discharge: Family;Available 24 hours/day Type of Home: House                              Lives With: Alone    Prior Functioning/Environment              Frequency  Min 3X/week        Progress Toward Goals  OT Goals(current goals can now be found in the care plan section)  Progress towards OT goals: Progressing toward goals  Acute Rehab OT Goals Patient Stated Goal: do as much therapy as possible to get better OT Goal Formulation: With patient/family Time For Goal Achievement: 01/02/21 Potential to Achieve Goals: Good  Plan Discharge plan remains appropriate;Frequency remains appropriate    Co-evaluation                 AM-PAC OT "6 Clicks" Daily Activity     Outcome Measure   Help from another person eating meals?: A Little (Gtube feeds, able to eat ice chips) Help from another person taking care of personal grooming?: A Little Help from another person toileting, which includes using toliet, bedpan, or urinal?: A Little Help from another person bathing (including washing, rinsing, drying)?: A Little Help from another person to put on and taking off regular upper body clothing?: A Little Help from another person to put on and taking off regular lower body clothing?: A Little 6 Click Score: 18    End of Session    OT Visit Diagnosis: Other abnormalities of gait and mobility (R26.89);Hemiplegia and hemiparesis Hemiplegia - Right/Left:  Right Hemiplegia - dominant/non-dominant: Dominant Hemiplegia - caused by: Cerebral infarction   Activity Tolerance Patient tolerated treatment well   Patient Left in bed;with call bell/phone within reach;with bed alarm set;with family/visitor present   Nurse Communication          Time: 3734-2876 OT Time Calculation (min): 30 min  Charges: OT General Charges $OT Visit: 1 Visit OT Treatments $Neuromuscular Re-education: 23-37 mins  Hanley Hays, MPH, MS, OTR/L ascom [phone] 12/21/20, 5:08 PM

## 2020-12-21 NOTE — Progress Notes (Signed)
Neurology Progress Note  Patient ID: ZAFIRA MUNOS Day is a 87 y.o. with past medical history significant for hypertension, hyperlipidemia, type 2 diabetes, pulmonary hypertension, meningioma, cervical spine stenosis s/p C-spine fusion, congestive heart failure, arrhythmia, severe esophageal stricture s/p feeding tube, sinus arrhythmia not on anticoagulation.   Initially consulted for: Code stroke  Subjective: -Feels well this morning with no headache, no acute complaints, aphasia is improving Exam: Current vital signs: BP (!) 136/52 (BP Location: Right Arm)    Pulse 73    Temp 97.6 F (36.4 C) (Oral)    Resp 20    Ht 5\' 7"  (1.702 m)    Wt 50.8 kg    SpO2 98%    BMI 17.54 kg/m  Vital signs in last 24 hours: Temp:  [97.5 F (36.4 C)-98.5 F (36.9 C)] 97.6 F (36.4 C) (03/23 1200) Pulse Rate:  [68-73] 73 (03/23 1200) Resp:  [14-20] 20 (03/23 1200) BP: (134-178)/(52-69) 136/52 (03/23 1200) SpO2:  [95 %-98 %] 98 % (03/23 1200) Weight:  [50.8 kg] 50.8 kg (03/23 0500)   Gen: In bed, no acute distress, comfortable and smiling Resp: Comfortable, no audible wheezing, symmetric chest rise Cardiac: Perfusing extremities well, RRR  Abd: soft, nt  Neuro: MS: Follows commands readily, able to name and repeat simple sentences.  Slightly slow fluency, and does make significant errors, productive more than expressive aphasia (also confounded by significant dysarthria) CN: Pupils equal and round, face with very mild right facial droop, EOMI, orients to stimuli in all visual fields, tongue midline, moderate to severe dysarthria Motor: Symmetrically moves legs antigravity without drift. Right arm is notable for mild pronation without drift today.  Coordination: High-five intact bilaterally   Pertinent Data:  Cr stable today at 1.05, BUN stable at 55  BNP 982.2 Initial troponin 16, then 92 on 3/19  Lab Results  Component Value Date   VITAMINB12 960 (H) 11/23/2019    Lab Results  Component  Value Date   HGBA1C 6.9 (H) 12/18/2020   Lab Results  Component Value Date   CHOL 178 12/18/2020   HDL 56 12/18/2020   LDLCALC 112 (H) 12/18/2020   TRIG 51 12/18/2020   CHOLHDL 3.2 12/18/2020   Lab Results  Component Value Date   TSH 4.466 11/23/2019    Head CT with hypodensity in the superior left MCA territory CTA with multifocal stenoses especially in the left MCA territory (which trifurcates) and a fair burden of atherosclerosis throughout CTA/CT perfusion rapid read as 14 mL core infarct and 28 mL of area at risk, likely slightly underestimated and slightly overestimated on my personal review when comparing dry head CT hypodensity to CT perfusion studies MRI brain personally reviewed, full radiology read pending.  Infarct correlating to hypodensity seen on head CT, with some petechial hemorrhage.  ECHO:  1. Left ventricular ejection fraction, by estimation, is 35 to 40%. The  left ventricle has moderately decreased function. The left ventricle  demonstrates global hypokinesis. The left ventricular internal cavity size  was mildly dilated. There is mild  left ventricular hypertrophy. Left ventricular diastolic parameters are  consistent with Grade II diastolic dysfunction (pseudonormalization).  2. Right ventricular systolic function is normal. The right ventricular  size is normal.  3. Left atrial size was moderately dilated.  4. Right atrial size was mildly dilated.  5. The mitral valve is normal in structure. Moderate to severe mitral  valve regurgitation.  6. The aortic valve is calcified. Aortic valve regurgitation is mild.  Moderate  aortic valve stenosis.   Impression: This is an 87 year old woman with multiple vascular risk factors as detailed above, presenting with a left MCA territory stroke out of the window.  At the time of my examination today her blood pressure is 145/53 and this is the best language exam I have gotten to date.  I no longer have any concerns  for blood pressure dependent exam at this point, and defer to primary team on sequentially restarting her important heart failure regimen gradually over a few days to confirm she tolerates these medications and to avoid dropping her blood pressure too quickly.  Recommendations: # Left MCA territory stroke, likely atheroembolic, cannot rule out cardioembolic at this time - Continue 81 mg daily aspirin lifelong - Plavix 300 mg load with 75 mg daily for 90 day course  - Unfortunately does not tolerate statin due to muscle pain, referred to advanced lipid clinic for consideration of PSK 9 inhibitor (order placed) - Risk factor modification - Telemetry monitoring; event monitor on discharge if no arrythmias captured  - Blood pressure goal: Normotension, with gradual titration of blood pressure medications - PT consult, OT consult, Speech consult - Neurology to sign off at this time, please page if new questions arise  [place] [phone]

## 2020-12-21 NOTE — Progress Notes (Signed)
Inpatient Rehab Admissions Coordinator:   Awaiting determination from [facility] on CIR auth request.  Will continue to follow.   Shann Medal, PT, DPT Admissions Coordinator [phone] 12/21/20  11:59 AM

## 2020-12-22 ENCOUNTER — Inpatient Hospital Stay (HOSPITAL_COMMUNITY)
Admission: RE | Admit: 2020-12-22 | Discharge: 2021-01-01 | DRG: 057 | Disposition: A | Discharge status: Home Health | Payer: Medicare Other | Source: Other Acute Inpatient Hospital | Attending: Physical Medicine & Rehabilitation | Admitting: Physical Medicine & Rehabilitation

## 2020-12-22 ENCOUNTER — Other Ambulatory Visit: Payer: Self-pay

## 2020-12-22 ENCOUNTER — Encounter (HOSPITAL_COMMUNITY): Payer: Self-pay | Admitting: Physical Medicine & Rehabilitation

## 2020-12-22 DIAGNOSIS — I272 Pulmonary hypertension, unspecified: Secondary | ICD-10-CM | POA: Diagnosis present

## 2020-12-22 DIAGNOSIS — Z681 Body mass index (BMI) 19 or less, adult: Secondary | ICD-10-CM

## 2020-12-22 DIAGNOSIS — E8809 Other disorders of plasma-protein metabolism, not elsewhere classified: Secondary | ICD-10-CM | POA: Diagnosis present

## 2020-12-22 DIAGNOSIS — I69351 Hemiplegia and hemiparesis following cerebral infarction affecting right dominant side: Principal | ICD-10-CM

## 2020-12-22 DIAGNOSIS — I69320 Aphasia following cerebral infarction: Secondary | ICD-10-CM | POA: Diagnosis not present

## 2020-12-22 DIAGNOSIS — E440 Moderate protein-calorie malnutrition: Secondary | ICD-10-CM | POA: Diagnosis present

## 2020-12-22 DIAGNOSIS — K5900 Constipation, unspecified: Secondary | ICD-10-CM | POA: Diagnosis present

## 2020-12-22 DIAGNOSIS — R531 Weakness: Secondary | ICD-10-CM | POA: Diagnosis not present

## 2020-12-22 DIAGNOSIS — E785 Hyperlipidemia, unspecified: Secondary | ICD-10-CM | POA: Diagnosis present

## 2020-12-22 DIAGNOSIS — I5023 Acute on chronic systolic (congestive) heart failure: Secondary | ICD-10-CM

## 2020-12-22 DIAGNOSIS — Z90710 Acquired absence of both cervix and uterus: Secondary | ICD-10-CM

## 2020-12-22 DIAGNOSIS — R109 Unspecified abdominal pain: Secondary | ICD-10-CM

## 2020-12-22 DIAGNOSIS — E43 Unspecified severe protein-calorie malnutrition: Secondary | ICD-10-CM | POA: Diagnosis present

## 2020-12-22 DIAGNOSIS — I110 Hypertensive heart disease with heart failure: Secondary | ICD-10-CM | POA: Diagnosis present

## 2020-12-22 DIAGNOSIS — Z85828 Personal history of other malignant neoplasm of skin: Secondary | ICD-10-CM

## 2020-12-22 DIAGNOSIS — I5022 Chronic systolic (congestive) heart failure: Secondary | ICD-10-CM | POA: Diagnosis present

## 2020-12-22 DIAGNOSIS — Z981 Arthrodesis status: Secondary | ICD-10-CM

## 2020-12-22 DIAGNOSIS — E1165 Type 2 diabetes mellitus with hyperglycemia: Secondary | ICD-10-CM | POA: Diagnosis present

## 2020-12-22 DIAGNOSIS — Z86011 Personal history of benign neoplasm of the brain: Secondary | ICD-10-CM

## 2020-12-22 DIAGNOSIS — Z931 Gastrostomy status: Secondary | ICD-10-CM

## 2020-12-22 DIAGNOSIS — I639 Cerebral infarction, unspecified: Secondary | ICD-10-CM | POA: Diagnosis present

## 2020-12-22 DIAGNOSIS — I63512 Cerebral infarction due to unspecified occlusion or stenosis of left middle cerebral artery: Secondary | ICD-10-CM | POA: Diagnosis not present

## 2020-12-22 DIAGNOSIS — M19041 Primary osteoarthritis, right hand: Secondary | ICD-10-CM | POA: Diagnosis present

## 2020-12-22 DIAGNOSIS — I69390 Apraxia following cerebral infarction: Secondary | ICD-10-CM

## 2020-12-22 DIAGNOSIS — R197 Diarrhea, unspecified: Secondary | ICD-10-CM | POA: Diagnosis present

## 2020-12-22 DIAGNOSIS — K222 Esophageal obstruction: Secondary | ICD-10-CM

## 2020-12-22 DIAGNOSIS — N390 Urinary tract infection, site not specified: Secondary | ICD-10-CM | POA: Diagnosis present

## 2020-12-22 DIAGNOSIS — K909 Intestinal malabsorption, unspecified: Secondary | ICD-10-CM

## 2020-12-22 DIAGNOSIS — B964 Proteus (mirabilis) (morganii) as the cause of diseases classified elsewhere: Secondary | ICD-10-CM | POA: Diagnosis present

## 2020-12-22 DIAGNOSIS — G2581 Restless legs syndrome: Secondary | ICD-10-CM | POA: Diagnosis present

## 2020-12-22 DIAGNOSIS — K219 Gastro-esophageal reflux disease without esophagitis: Secondary | ICD-10-CM | POA: Diagnosis present

## 2020-12-22 DIAGNOSIS — I10 Essential (primary) hypertension: Secondary | ICD-10-CM

## 2020-12-22 DIAGNOSIS — E46 Unspecified protein-calorie malnutrition: Secondary | ICD-10-CM

## 2020-12-22 DIAGNOSIS — I739 Peripheral vascular disease, unspecified: Secondary | ICD-10-CM

## 2020-12-22 DIAGNOSIS — L8990 Pressure ulcer of unspecified site, unspecified stage: Secondary | ICD-10-CM | POA: Insufficient documentation

## 2020-12-22 DIAGNOSIS — D638 Anemia in other chronic diseases classified elsewhere: Secondary | ICD-10-CM | POA: Diagnosis present

## 2020-12-22 DIAGNOSIS — L89151 Pressure ulcer of sacral region, stage 1: Secondary | ICD-10-CM | POA: Diagnosis present

## 2020-12-22 LAB — COMPREHENSIVE METABOLIC PANEL
ALT: 16 U/L (ref 0–44)
AST: 21 U/L (ref 15–41)
Albumin: 2.8 g/dL — ABNORMAL LOW (ref 3.5–5.0)
Alkaline Phosphatase: 51 U/L (ref 38–126)
Anion gap: 7 (ref 5–15)
BUN: 47 mg/dL — ABNORMAL HIGH (ref 8–23)
CO2: 27 mmol/L (ref 22–32)
Calcium: 8.1 mg/dL — ABNORMAL LOW (ref 8.9–10.3)
Chloride: 103 mmol/L (ref 98–111)
Creatinine, Ser: 1.05 mg/dL — ABNORMAL HIGH (ref 0.44–1.00)
GFR, Estimated: 51 mL/min — ABNORMAL LOW (ref >60)
Glucose, Bld: 277 mg/dL — ABNORMAL HIGH (ref 70–99)
Potassium: 4.6 mmol/L (ref 3.5–5.1)
Sodium: 137 mmol/L (ref 135–145)
Total Bilirubin: 0.4 mg/dL (ref 0.3–1.2)
Total Protein: 5.6 g/dL — ABNORMAL LOW (ref 6.5–8.1)

## 2020-12-22 LAB — BASIC METABOLIC PANEL
Anion gap: 4 — ABNORMAL LOW (ref 5–15)
BUN: 56 mg/dL — ABNORMAL HIGH (ref 8–23)
CO2: 30 mmol/L (ref 22–32)
Calcium: 8.4 mg/dL — ABNORMAL LOW (ref 8.9–10.3)
Chloride: 108 mmol/L (ref 98–111)
Creatinine, Ser: 1.05 mg/dL — ABNORMAL HIGH (ref 0.44–1.00)
GFR, Estimated: 51 mL/min — ABNORMAL LOW (ref >60)
Glucose, Bld: 116 mg/dL — ABNORMAL HIGH (ref 70–99)
Potassium: 4.9 mmol/L (ref 3.5–5.1)
Sodium: 142 mmol/L (ref 135–145)

## 2020-12-22 LAB — CULTURE, BLOOD (ROUTINE X 2)
Culture: NO GROWTH
Culture: NO GROWTH
Special Requests: ADEQUATE

## 2020-12-22 LAB — GLUCOSE, CAPILLARY
Glucose-Capillary: 102 mg/dL — ABNORMAL HIGH (ref 70–99)
Glucose-Capillary: 116 mg/dL — ABNORMAL HIGH (ref 70–99)
Glucose-Capillary: 133 mg/dL — ABNORMAL HIGH (ref 70–99)
Glucose-Capillary: 191 mg/dL — ABNORMAL HIGH (ref 70–99)
Glucose-Capillary: 208 mg/dL — ABNORMAL HIGH (ref 70–99)
Glucose-Capillary: 237 mg/dL — ABNORMAL HIGH (ref 70–99)

## 2020-12-22 LAB — CBC
HCT: 31 % — ABNORMAL LOW (ref 36.0–46.0)
Hemoglobin: 9.7 g/dL — ABNORMAL LOW (ref 12.0–15.0)
MCH: 29.1 pg (ref 26.0–34.0)
MCHC: 31.3 g/dL (ref 30.0–36.0)
MCV: 93.1 fL (ref 80.0–100.0)
Platelets: 269 10*3/uL (ref 150–400)
RBC: 3.33 MIL/uL — ABNORMAL LOW (ref 3.87–5.11)
RDW: 14.5 % (ref 11.5–15.5)
WBC: 7.5 10*3/uL (ref 4.0–10.5)
nRBC: 0 % (ref 0.0–0.2)

## 2020-12-22 MED ORDER — PROSOURCE TF PO LIQD
45.0000 mL | Freq: Every day | ORAL | Status: DC
  Administered 2020-12-23: 45 mL
  Filled 2020-12-22: qty 45

## 2020-12-22 MED ORDER — GUAIFENESIN-DM 100-10 MG/5ML PO SYRP: 5.0000 mL | ORAL_SOLUTION | Freq: Four times a day (QID) | ORAL | Status: DC | PRN

## 2020-12-22 MED ORDER — SODIUM CHLORIDE 0.9 % IV SOLN
1.0000 g | INTRAVENOUS | Status: DC
  Administered 2020-12-22 – 2020-12-25 (×4): 1 g via INTRAVENOUS
  Filled 2020-12-22: qty 10
  Filled 2020-12-22 (×4): qty 1

## 2020-12-22 MED ORDER — ONDANSETRON HCL 4 MG/2ML IJ SOLN: 4.0000 mg | Freq: Four times a day (QID) | INTRAMUSCULAR | Status: DC | PRN

## 2020-12-22 MED ORDER — PROCHLORPERAZINE EDISYLATE 10 MG/2ML IJ SOLN: 5.0000 mg | Freq: Four times a day (QID) | INTRAMUSCULAR | Status: DC | PRN

## 2020-12-22 MED ORDER — TRAZODONE HCL 50 MG PO TABS: 25.0000 mg | ORAL_TABLET | Freq: Every evening | ORAL | Status: DC | PRN

## 2020-12-22 MED ORDER — FUROSEMIDE 20 MG PO TABS
10.0000 mg | ORAL_TABLET | Freq: Every day | ORAL | Status: DC
  Administered 2020-12-23 – 2021-01-01 (×10): 10 mg
  Filled 2020-12-22 (×10): qty 1

## 2020-12-22 MED ORDER — ALUM & MAG HYDROXIDE-SIMETH 200-200-20 MG/5ML PO SUSP: 30.0000 mL | ORAL | Status: DC | PRN

## 2020-12-22 MED ORDER — KATE FARMS STANDARD 1.4 PO LIQD: 325.0000 mL | Freq: Three times a day (TID) | ORAL | 10 refills | Status: DC

## 2020-12-22 MED ORDER — LIVING WELL WITH DIABETES BOOK
Freq: Once | Status: AC
  Filled 2020-12-22: qty 1

## 2020-12-22 MED ORDER — FREE WATER
50.0000 mL | Freq: Three times a day (TID) | Status: DC
  Administered 2020-12-22 – 2020-12-23 (×3): 50 mL

## 2020-12-22 MED ORDER — CLOPIDOGREL BISULFATE 75 MG PO TABS
75.0000 mg | ORAL_TABLET | Freq: Every day | ORAL | Status: DC
  Administered 2020-12-23 – 2021-01-01 (×10): 75 mg
  Filled 2020-12-22 (×10): qty 1

## 2020-12-22 MED ORDER — HYDROCODONE-ACETAMINOPHEN 7.5-325 MG/15ML PO SOLN: 5.0000 mL | Freq: Four times a day (QID) | ORAL | Status: DC | PRN

## 2020-12-22 MED ORDER — POLYETHYLENE GLYCOL 3350 17 G PO PACK: 17.0000 g | PACK | Freq: Every day | ORAL | Status: DC | PRN

## 2020-12-22 MED ORDER — MORPHINE SULFATE (PF) 2 MG/ML IV SOLN: 1.0000 mg | INTRAVENOUS | Status: DC | PRN

## 2020-12-22 MED ORDER — POLYETHYLENE GLYCOL 3350 17 G PO PACK
17.0000 g | PACK | Freq: Every day | ORAL | Status: DC
  Administered 2020-12-23 – 2020-12-26 (×4): 17 g
  Filled 2020-12-22 (×4): qty 1

## 2020-12-22 MED ORDER — ENOXAPARIN SODIUM 40 MG/0.4ML ~~LOC~~ SOLN: 40.0000 mg | SUBCUTANEOUS | Status: DC

## 2020-12-22 MED ORDER — FREE WATER
100.0000 mL | Freq: Four times a day (QID) | Status: DC
  Administered 2020-12-22 – 2021-01-01 (×39): 100 mL

## 2020-12-22 MED ORDER — ROPINIROLE HCL 1 MG PO TABS
2.0000 mg | ORAL_TABLET | Freq: Two times a day (BID) | ORAL | Status: DC
  Administered 2020-12-22 – 2021-01-01 (×20): 2 mg via JEJUNOSTOMY
  Filled 2020-12-22 (×20): qty 2

## 2020-12-22 MED ORDER — CARVEDILOL 3.125 MG PO TABS: 3.1250 mg | ORAL_TABLET | Freq: Two times a day (BID) | ORAL | 3 refills | Status: AC

## 2020-12-22 MED ORDER — BISACODYL 10 MG RE SUPP: 10.0000 mg | Freq: Every day | RECTAL | Status: DC | PRN

## 2020-12-22 MED ORDER — POLYVINYL ALCOHOL 1.4 % OP SOLN
1.0000 [drp] | OPHTHALMIC | Status: DC | PRN
  Administered 2020-12-24 – 2020-12-25 (×2): 1 [drp] via OPHTHALMIC
  Filled 2020-12-22 (×2): qty 15

## 2020-12-22 MED ORDER — INSULIN ASPART 100 UNIT/ML ~~LOC~~ SOLN
0.0000 [IU] | Freq: Three times a day (TID) | SUBCUTANEOUS | Status: DC
  Administered 2020-12-22: 2 [IU] via SUBCUTANEOUS
  Administered 2020-12-23 (×2): 1 [IU] via SUBCUTANEOUS
  Administered 2020-12-23 (×2): 2 [IU] via SUBCUTANEOUS
  Administered 2020-12-24 (×3): 1 [IU] via SUBCUTANEOUS
  Administered 2020-12-24 – 2020-12-25 (×2): 2 [IU] via SUBCUTANEOUS
  Administered 2020-12-25: 1 [IU] via SUBCUTANEOUS
  Administered 2020-12-25 (×2): 2 [IU] via SUBCUTANEOUS
  Administered 2020-12-26 (×2): 1 [IU] via SUBCUTANEOUS
  Administered 2020-12-26 (×2): 2 [IU] via SUBCUTANEOUS
  Administered 2020-12-27: 1 [IU] via SUBCUTANEOUS
  Administered 2020-12-27 – 2020-12-28 (×3): 2 [IU] via SUBCUTANEOUS
  Administered 2020-12-28 – 2020-12-29 (×5): 1 [IU] via SUBCUTANEOUS
  Administered 2020-12-29 – 2020-12-30 (×2): 2 [IU] via SUBCUTANEOUS
  Administered 2020-12-30: 1 [IU] via SUBCUTANEOUS
  Administered 2020-12-30: 2 [IU] via SUBCUTANEOUS
  Administered 2020-12-31 (×4): 1 [IU] via SUBCUTANEOUS

## 2020-12-22 MED ORDER — SODIUM CHLORIDE 0.9 % IV SOLN: 1.0000 g | INTRAVENOUS | 0 refills | Status: DC

## 2020-12-22 MED ORDER — ALBUTEROL SULFATE (2.5 MG/3ML) 0.083% IN NEBU: 2.5000 mg | INHALATION_SOLUTION | Freq: Four times a day (QID) | RESPIRATORY_TRACT | Status: DC | PRN

## 2020-12-22 MED ORDER — SENNOSIDES 8.8 MG/5ML PO SYRP
5.0000 mL | ORAL_SOLUTION | Freq: Two times a day (BID) | ORAL | Status: DC
  Administered 2020-12-22 – 2020-12-26 (×8): 5 mL
  Filled 2020-12-22 (×11): qty 5

## 2020-12-22 MED ORDER — ASPIRIN 81 MG PO CHEW: 81.0000 mg | CHEWABLE_TABLET | Freq: Every day | ORAL | 0 refills | Status: DC

## 2020-12-22 MED ORDER — PROCHLORPERAZINE 25 MG RE SUPP: 12.5000 mg | Freq: Four times a day (QID) | RECTAL | Status: DC | PRN

## 2020-12-22 MED ORDER — CARVEDILOL 3.125 MG PO TABS
3.1250 mg | ORAL_TABLET | Freq: Two times a day (BID) | ORAL | Status: DC
  Administered 2020-12-22 – 2021-01-01 (×19): 3.125 mg
  Filled 2020-12-22 (×20): qty 1

## 2020-12-22 MED ORDER — KATE FARMS STANDARD 1.4 PO LIQD
325.0000 mL | Freq: Three times a day (TID) | ORAL | Status: DC
  Administered 2020-12-22 – 2020-12-23 (×4): 325 mL
  Filled 2020-12-22 (×5): qty 325

## 2020-12-22 MED ORDER — POLYETHYLENE GLYCOL 3350 17 G PO PACK: 17.0000 g | PACK | Freq: Every day | ORAL | 0 refills | Status: DC

## 2020-12-22 MED ORDER — HYDROCERIN EX CREA
TOPICAL_CREAM | Freq: Two times a day (BID) | CUTANEOUS | Status: DC
  Administered 2020-12-28 – 2020-12-30 (×2): 1 via TOPICAL
  Filled 2020-12-22: qty 113

## 2020-12-22 MED ORDER — INSULIN ASPART 100 UNIT/ML ~~LOC~~ SOLN
3.0000 [IU] | Freq: Once | SUBCUTANEOUS | Status: AC
  Administered 2020-12-22: 3 [IU] via SUBCUTANEOUS

## 2020-12-22 MED ORDER — ASPIRIN 81 MG PO CHEW
81.0000 mg | CHEWABLE_TABLET | Freq: Every day | ORAL | Status: DC
  Administered 2020-12-23 – 2021-01-01 (×10): 81 mg
  Filled 2020-12-22 (×10): qty 1

## 2020-12-22 MED ORDER — DIPHENHYDRAMINE HCL 12.5 MG/5ML PO ELIX: 12.5000 mg | ORAL_SOLUTION | Freq: Four times a day (QID) | ORAL | Status: DC | PRN

## 2020-12-22 MED ORDER — DOCUSATE SODIUM 50 MG/5ML PO LIQD: 100.0000 mg | Freq: Two times a day (BID) | ORAL | Status: DC | PRN

## 2020-12-22 MED ORDER — ACETAMINOPHEN 325 MG PO TABS: 325.0000 mg | ORAL_TABLET | ORAL | Status: DC | PRN

## 2020-12-22 MED ORDER — PROCHLORPERAZINE MALEATE 5 MG PO TABS: 5.0000 mg | ORAL_TABLET | Freq: Four times a day (QID) | ORAL | Status: DC | PRN

## 2020-12-22 MED ORDER — PROSOURCE TF PO LIQD: 45.0000 mL | Freq: Every day | ORAL | 0 refills | Status: DC

## 2020-12-22 MED ORDER — FLEET ENEMA 7-19 GM/118ML RE ENEM: 1.0000 | ENEMA | Freq: Once | RECTAL | Status: DC | PRN

## 2020-12-22 MED ORDER — PANTOPRAZOLE SODIUM 40 MG IV SOLR
40.0000 mg | INTRAVENOUS | Status: DC
  Administered 2020-12-22 – 2020-12-31 (×10): 40 mg via INTRAVENOUS
  Filled 2020-12-22 (×11): qty 40

## 2020-12-22 MED ORDER — CHLORHEXIDINE GLUCONATE CLOTH 2 % EX PADS
6.0000 | MEDICATED_PAD | Freq: Every day | CUTANEOUS | Status: DC
  Administered 2020-12-23: 6 via TOPICAL

## 2020-12-22 MED ORDER — DOCUSATE SODIUM 50 MG/5ML PO LIQD: 50.0000 mg | Freq: Two times a day (BID) | ORAL | 0 refills | Status: DC

## 2020-12-22 MED ORDER — FUROSEMIDE 20 MG PO TABS: 10.0000 mg | ORAL_TABLET | Freq: Every day | ORAL | 3 refills | Status: DC

## 2020-12-22 MED ORDER — ACETAMINOPHEN 325 MG PO TABS
325.0000 mg | ORAL_TABLET | ORAL | Status: DC | PRN
  Administered 2020-12-29: 650 mg
  Filled 2020-12-22: qty 2

## 2020-12-22 MED ORDER — DOCUSATE SODIUM 50 MG/5ML PO LIQD
50.0000 mg | Freq: Two times a day (BID) | ORAL | Status: DC
  Administered 2020-12-22 – 2020-12-26 (×8): 50 mg
  Filled 2020-12-22 (×9): qty 10

## 2020-12-22 NOTE — Progress Notes (Signed)
Inpatient Rehabilitation  Patient information reviewed and entered into eRehab system by Melissa M. Bowie, M.A., CCC/SLP, PPS Coordinator.  Information including medical coding, functional ability and quality indicators will be reviewed and updated through discharge.    

## 2020-12-22 NOTE — Progress Notes (Addendum)
PMR Admission Coordinator Pre-Admission Assessment   Patient: [name] is an 87 y.o., female MRN: [id number] DOB: Feb 03, 1929 Height: 5\' 7"  (170.2 cm) Weight: 53.2 kg                                                                                                                                                  Insurance Information HMO:     PPO: yes     PCP:      IPA:      80/20:      OTHER:  PRIMARY: BCBS Medicare      Policy#: [name]      Subscriber: pt CM Name: [name]      Phone#: [phone]     Fax#: [phone] Pre-Cert#: auth for 9/24 through 3/31, auth number [id number], updates due to [name] at fax listed above on 3/31    Employer:  Benefits:  Phone #: [phone]     Name:  Eff. Date: 10/01/20     Deduct: $0      Out of Pocket Max: $5900 ($206.51)      Life Max: n/a  CIR: $335/admission      SNF: $188/[name] Outpatient:      Co-Pay: $40 Home Health: 100%      Co-Pay:  DME: 80%     Co-Ins: 20% Providers:  SECONDARY:       Policy#:       Phone#:    Development worker, community:       Phone#:    The "Data Collection Information Summary" for patients in Inpatient Rehabilitation Facilities with attached "Privacy Act Scraper Records" was provided and verbally reviewed with: Patient and Family   Emergency Contact Information         Contact Information     Name Relation Home Work Mobile    [name],[name]     [phone]    [name],[name] [phone]   [phone]    [name], [name]     [phone]    Franky Macho Other     [phone]       Current Medical History  Patient Admitting Diagnosis: [name]    History of Present Illness: [name] is a 87 y.o. female who presented to [name] on 12/17/20 with complaints of malaise, confusion, and weakness and was found to have a left [name] stroke, acute hypoxic respiratory failure secondary to flash pulmonary edema (required bipap briefly), and hypertension. PMH includes anemia, aortic  valve sclerosis, esophageal stricture s/p PEG, CHF, and brain tumor.  Neurology was consulted for CVA and recommended 81 mg aspirin daily, and plavix for 90 days.  Initially required bipap for respiratory failure, O2 sat on admission was 68%, resolved with IV lasix.  Therapy evaluations were completed and pt was recommended for CIR.    Complete NIHSS TOTAL: 4 Glasgow Coma Scale  Score: 15   Past Medical History      Past Medical History:  Diagnosis Date  . Anemia      IRON INFUSIONS  . Aortic valve sclerosis    . Arrhythmia    . Arthritis      osteoarthritis  . Basal cell carcinoma of skin    . Brain tumor ([place])    . Brain tumor ([place])    . Cervical spine disease    . CHF (congestive heart failure) ([place])    . Diabetes mellitus without complication ([place])    . Dysrhythmia      sinus arrhythmia  . Esophageal stricture      severe, led to feeding tube placement in Sept 2019  . Esophageal ulcer without bleeding    . Gastrostomy tube dependent ([place])      DOES NOT EAT OR DRINK   . GERD (gastroesophageal reflux disease)    . History of kidney stones    . Hyperlipemia    . Hypertension    . Kidney stones    . Leaky heart valve    . Meningioma ([place])    . 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 Pulmonary hypertension ([place])    . RLS (restless legs syndrome)    . Vertigo        Family History  family history includes Cancer in her father; Diabetes in her sister; Heart attack in her brother and sister; Heart disease in her father and mother; Hypertension in her mother; Stroke in her mother.   Prior Rehab/Hospitalizations:  Has the patient had prior rehab or hospitalizations prior to admission? No   Has the patient had major surgery during 100 days prior to admission? No   Current Medications    Current Facility-Administered Medications:  .  0.9 %  sodium chloride infusion, 250 mL, Intravenous, Continuous, Aleskerov, Fuad, MD, Last Rate: 10 mL/hr at 12/22/20 0525, Infusion Verify  at 12/22/20 0525 .  albuterol (PROVENTIL) (2.5 MG/3ML) 0.083% nebulizer solution 2.5 mg, 2.5 mg, Nebulization, Q6H PRN, Rust-Chester, Britton L, NP .  aspirin chewable tablet 81 mg, 81 mg, Per Tube, Daily, Rust-Chester, Britton L, NP, 81 mg at 12/21/20 0913 .  carvedilol (COREG) tablet 3.125 mg, 3.125 mg, Per Tube, BID WC, Wieting, Richard, MD, 3.125 mg at 12/21/20 1627 .  cefTRIAXone (ROCEPHIN) 1 g in sodium chloride 0.9 % 100 mL IVPB, 1 g, Intravenous, Q24H, Regalado, Belkys A, MD, Stopped at 12/21/20 1925 .  Chlorhexidine Gluconate Cloth 2 % PADS 6 each, 6 each, Topical, Daily, Rust-Chester, Britton L, NP, 6 each at 12/21/20 1000 .  clopidogrel (PLAVIX) tablet 75 mg, 75 mg, Per Tube, Daily, Bhagat, Srishti L, MD, 75 mg at 12/21/20 0913 .  docusate (COLACE) 50 MG/5ML liquid 100 mg, 100 mg, Per Tube, BID PRN, Regalado, Belkys A, MD .  sennosides (SENOKOT) 8.8 MG/5ML syrup 5 mL, 5 mL, Per Tube, BID **AND** docusate (COLACE) 50 MG/5ML liquid 50 mg, 50 mg, Per Tube, BID, Regalado, Belkys A, MD .  feeding supplement (KATE FARMS STANDARD 1.4) liquid 325 mL, 325 mL, Per Tube, TID, Regalado, Belkys A, MD, 325 mL at 12/21/20 2048 .  feeding supplement (PROSource TF) liquid 45 mL, 45 mL, Per Tube, Daily, Loletha Grayer, MD, 45 mL at 12/21/20 0914 .  free water 100 mL, 100 mL, Per Tube, Q6H, Wieting, Richard, MD, 100 mL at 12/22/20 0507 .  free water 50 mL, 50 mL, Per Tube, TID, Leslye Peer, Richard, MD, 50 mL at  12/21/20 2048 .  furosemide (LASIX) tablet 10 mg, 10 mg, Per Tube, Daily, Loletha Grayer, MD, 10 mg at 12/21/20 0912 .  HYDROcodone-acetaminophen (HYCET) 7.5-325 mg/15 ml solution 5 mL, 5 mL, Per Tube, Q6H PRN, Wieting, Richard, MD .  insulin aspart (novoLOG) injection 0-9 Units, 0-9 Units, Subcutaneous, Q4H, Loletha Grayer, MD, 3 Units at 12/22/20 0018 .  morphine 2 MG/ML injection 1 mg, 1 mg, Intravenous, Q4H PRN, Loletha Grayer, MD, 1 mg at 12/20/20 1041 .  ondansetron (ZOFRAN) injection 4  mg, 4 mg, Intravenous, Q6H PRN, Loletha Grayer, MD, 4 mg at 12/20/20 1004 .  pantoprazole (PROTONIX) injection 40 mg, 40 mg, Intravenous, Q24H, Rust-Chester, Britton L, NP, 40 mg at 12/22/20 0017 .  polyethylene glycol (MIRALAX / GLYCOLAX) packet 17 g, 17 g, Per Tube, Daily, Loletha Grayer, MD, 17 g at 12/21/20 0912 .  polyvinyl alcohol (LIQUIFILM TEARS) 1.4 % ophthalmic solution 1 drop, 1 drop, Both Eyes, PRN, Regalado, Belkys A, MD .  rOPINIRole (REQUIP) tablet 2 mg, 2 mg, Per J Tube, BID, Loletha Grayer, MD, 2 mg at 12/21/20 2047   Patients Current Diet:     Diet Order                      Diet NPO time specified  Diet effective now                      Precautions / Restrictions Precautions Precautions: Fall Precaution Comments: HOB > 30 deg Restrictions Weight Bearing Restrictions: No Other Position/Activity Restrictions: G-tube to LLQ    Has the patient had 2 or more falls or a fall with injury in the past year?No   Prior Activity Level Limited Community (1-2x/wk): was driving short distances, using rollator or cane for mobility, cooking for her family, managing meds, and ADLs, iADLs, her daughter does her grocery shopping for her but she makes the list   Prior Functional Level Prior Function Level of Independence: Independent with assistive device(s) Comments: Mod Ind amb with a rollator in the home and a QC in the community, Ind with ADL including Gtube feedings, takes sponge baths, no recent fall history; two daughters and other family are able to cover 24 hr supervision if needed   Self Care: Did the patient need help bathing, dressing, using the toilet or eating?  Independent   Indoor Mobility: Did the patient need assistance with walking from room to room (with or without device)? Independent   Stairs: Did the patient need assistance with internal or external stairs (with or without device)? Independent   Functional Cognition: Did the patient need help  planning regular tasks such as shopping or remembering to take medications? Independent   Home Assistive Devices / Equipment Home Equipment: Walker - 2 wheels,Walker - 4 wheels,Cane - quad,Bedside commode   Prior Device Use: Indicate devices/aids used by the patient prior to current illness, exacerbation or injury? Walker and cane   Current Functional Level Cognition   Arousal/Alertness: Awake/alert Overall Cognitive Status: Difficult to assess Difficult to assess due to: Impaired communication Orientation Level: Oriented to person,Oriented to place,Oriented to time,Oriented to situation (via yes/no questions) General Comments: cues for visual attention to R side, cues for exaggerated movements and going slowly to improve quality of movement Attention: Selective Selective Attention: Appears intact Memory: Appears intact    Extremity Assessment (includes Sensation/Coordination)   Upper Extremity Assessment: RUE deficits/detail,LUE deficits/detail RUE Deficits / Details: very mild pronator drift noted, impaired FMC/GMC,  impaired sensation, mild visual inattention to RUE during tasks, shoulder flexion grossly 4-/5, elbow flexion 4/5, elbow extension 4-/5, grip 3+/5, arthritic joints RUE Sensation: decreased light touch,decreased proprioception RUE Coordination: decreased fine motor,decreased gross motor LUE Deficits / Details: strength WFL, very mild coordination deficits but functionally not limiting, arthritic joints  Lower Extremity Assessment: Defer to PT evaluation,Generalized weakness,RLE deficits/detail,LLE deficits/detail RLE Deficits / Details: RLE strength grossly 4/5 and equal [name]/R RLE Sensation: decreased light touch RLE Coordination: decreased gross motor LLE Deficits / Details: LLE strength grossly 4/5 and equal [name]/R LLE Coordination: WNL     ADLs   Overall ADL's : Needs assistance/impaired Eating/Feeding: Sitting,Minimal assistance,Cueing for sequencing Eating/Feeding  Details (indicate cue type and reason): Pt allowed to take ice chips. Seated EOB, pt able to grossly grasp small cup with R hand w/ cues for composite finger extension, visual attention to improve performance; pt able to sustain grasp Grooming: Sitting,Wash/dry hands,Wash/dry face,Set up,Supervision/safety,Cueing for sequencing Grooming Details (indicate cue type and reason): Seated EOB, pt grasped tissues, able to grossly wring out a washcloth and wash face with min-mod coordination deficits, improving with visual attention and cues Upper Body Bathing: Sitting,Minimal assistance Lower Body Bathing: Sit to/from stand,Minimal assistance Upper Body Dressing : Sitting,Minimal assistance Lower Body Dressing: Sit to/from stand,Minimal assistance Toilet Transfer: Minimal assistance,BSC,RW,Ambulation General ADL Comments: grossly MIN A for grooming, UB and LB ADL tasks, intermittent cues for RUE attention/safety     Mobility   Overal bed mobility: Modified Independent General bed mobility comments: Extra time and effort only     Transfers   Overall transfer level: Needs assistance Equipment used: Rolling walker (2 wheeled) Transfers: Sit to/from Stand Sit to Stand: Min guard General transfer comment: Cues for hand placement     Ambulation / Gait / Stairs / Wheelchair Mobility   Ambulation/Gait Ambulation/Gait assistance: Counsellor (Feet): 100 Feet Assistive device: Rolling walker (2 wheeled) Gait Pattern/deviations: Step-through pattern,Decreased step length - right,Shuffle General Gait Details: Mod verbal cues to attend to R hand to ensure proper placement on the RW and for increased RLE step length and amb closer to the RW with upright posture Gait velocity: decreased     Posture / Balance Dynamic Sitting Balance Sitting balance - Comments: pt challenged in static and dynamic sitting balance, no LOB, PRN cues for UE support Balance Overall balance assessment: Needs  assistance Sitting-balance support: Feet supported,No upper extremity supported,Single extremity supported Sitting balance-Leahy Scale: [name] Sitting balance - Comments: pt challenged in static and dynamic sitting balance, no LOB, PRN cues for UE support Postural control: Posterior lean Standing balance support: Bilateral upper extremity supported,During functional activity Standing balance-Leahy Scale: Fair Standing balance comment: Min lean on the RW for support in standing but no LOB this session     Special needs/care consideration Skin peg tube (chronic) and Diabetic management yes        Previous Home Environment (from acute therapy documentation) Living Arrangements: Alone  Lives With: Alone Available Help at Discharge: Family,Available 24 hours/[name] Type of Home: House Home Layout: One level Home Access: Ramped entrance Bathroom Shower/Tub: Chiropodist: Handicapped height Additional Comments: Dtr in room provided history secondary to pt with expressive difficulty   Discharge Living Setting Plans for Discharge Living Setting: Patient's home Type of Home at Discharge: House Discharge Home Layout: One level Discharge Home Access: [place] entrance Discharge Bathroom Shower/Tub: [place] unit Discharge Bathroom Toilet: Standard Discharge Bathroom Accessibility: Yes How Accessible: Accessible via walker Does the patient have  any problems obtaining your medications?: No   Social/Family/Support Systems Anticipated Caregiver: daughters, [name] and [place] Anticipated Caregiver's Contact Information: [name] [phone]; [name] [phone] Ability/Limitations of Caregiver: n/a Caregiver Availability: 24/7 Discharge Plan Discussed with Primary Caregiver: Yes Is Caregiver In Agreement with Plan?: Yes Does Caregiver/Family have Issues with Lodging/Transportation while Pt is in Rehab?: No     Goals Patient/Family Goal for Rehab: PT/OT supervision to mod I,  SLP supervision to min assist Expected length of stay: 7-10 days Additional Information: pt with a PEG prior to admit 2/2 esophogeal stricture Pt/Family Agrees to Admission and willing to participate: Yes Program Orientation Provided & Reviewed with Pt/Caregiver Including Roles  & Responsibilities: Yes  Barriers to Discharge: Insurance for SNF coverage     Decrease burden of Care through IP rehab admission: n/a   Possible need for SNF placement upon discharge: No     Patient Condition: This patient's medical and functional status has changed since the consult dated: 3/22 in which the Rehabilitation Physician determined and documented that the patient's condition is appropriate for intensive rehabilitative care in an inpatient rehabilitation facility. See "History of Present Illness" (above) for medical update. Functional changes are: pt min assist for mobility. Patient's medical and functional status update has been discussed with the Rehabilitation physician and patient remains appropriate for inpatient rehabilitation. Will admit to inpatient rehab today.   Preadmission Screen Completed By:  Michel Santee, PT, DPT 12/22/2020 10:02 AM ______________________________________________________________________   Discussed status with Dr. Dagoberto Ligas on 12/22/20 at 10:13 AM  and received approval for admission today.   Admission Coordinator:  Michel Santee, PT, DPT time 10:14 AM Sudie Grumbling 12/22/20              Cosigned by: Courtney Heys, MD at 12/22/2020 11:30 AM

## 2020-12-22 NOTE — Progress Notes (Signed)
Inpatient Rehab Admissions Coordinator:    I have insurance approval and a bed available for pt to admit to CIR today. Dr. Tyrell Antonio in agreement.  Will let pt/family and TOC team know.   Shann Medal, PT, DPT Admissions Coordinator [phone] 12/22/20  10:01 AM

## 2020-12-22 NOTE — IPOC Note (Signed)
Individualized overall Plan of Care [facility]) Patient Details Name: [name] Day MRN: [id number] DOB: [birth year]  Admitting Diagnosis: Cerebrovascular accident (CVA) due to occlusion of left middle cerebral artery [facility])  Hospital Problems: Principal Problem:   Cerebrovascular accident (CVA) due to occlusion of left middle cerebral artery ([place]) Active Problems:   Esophageal stricture   Protein-calorie malnutrition, severe   Gastrostomy tube in place [facility])   Right sided weakness   Stroke (cerebrum) ([place])   Hypoalbuminemia due to protein-calorie malnutrition ([place])   Acute lower UTI     Functional Problem List: Nursing Bladder,Endurance,Nutrition,Medication Management,Safety  PT [place] (Comment),Sensory (expressive communication)  SLP Linguistic,Motor  TR         Basic ADLs: OT Grooming,Bathing,Dressing,Toileting     Advanced  ADLs: OT       Transfers: PT Bed to [employer]  OT Toilet,Tub/Shower     Locomotion: PT Ambulation,Stairs     Additional Impairments: OT Fuctional Use of Upper Extremity  SLP Communication expression,comprehension    TR      Anticipated Outcomes Item Anticipated Outcome  Self Feeding n/a  Swallowing  N/A   Basic self-care  supervision  Toileting  supervision   Bathroom Transfers supervision  Bowel/Bladder  manage bladder wtih cues/reminders; timed toileting  Transfers  mod I  Locomotion  mod I  Communication  minA expressive, supervision comprehension  Cognition  N/A  Pain  n/a  Safety/Judgment  maintain safety with cues/reminders   Therapy Plan: PT Intensity: Minimum of 1-2 x/day ,45 to 90 minutes PT Frequency: 5 out of 7 days PT Duration Estimated Length of Stay: 7-10 days OT Intensity: Minimum of 1-2 x/day, 45 to 90 minutes OT Frequency: 5 out of 7 days OT Duration/Estimated Length of Stay: 7 days SLP Intensity: Minumum of 1-2 x/day, 30 to 90  minutes SLP Frequency: 3 to 5 out of 7 days SLP Duration/Estimated Length of Stay: 7-10 days    Team Interventions: Nursing Interventions Patient/Family Education,Bladder Management,Disease Management/Prevention,Medication Web designer  PT interventions Ambulation/gait training,Stair training,Disease management/prevention,Balance/vestibular training,Patient/family education,Therapeutic Activities,Psychosocial support,Therapeutic Exercise,Functional mobility training,UE/LE Strength taining/ROM,Discharge planning,Neuromuscular re-education,UE/LE Coordination activities  OT Interventions Balance/vestibular training,Discharge planning,Neuromuscular re-education,Functional mobility training,DME/adaptive equipment instruction,Patient/family education,Psychosocial support,Self Care/advanced ADL retraining,Visual/perceptual remediation/compensation,UE/LE Coordination activities,UE/LE Strength taining/ROM,Therapeutic Exercise,Therapeutic Activities  SLP Interventions Speech/Language facilitation,Cueing hierarchy,Multimodal communication approach,Patient/family education  TR Interventions    SW/CM Interventions Discharge Planning,Psychosocial Support,Patient/Family Education   Barriers to Discharge MD  Medical stability  Nursing Decreased caregiver support,Home environment access/layout,Nutrition means    PT      OT      SLP Other (comments) N/A  SW       Team Discharge Planning: Destination: PT-Home ,OT- Home , SLP-Home Projected Follow-up: PT-Outpatient PT,Home health PT (depending on transportation), OT-  Home health OT, SLP-Outpatient SLP,Home Health SLP Projected Equipment Needs: PT-To be determined, OT- Tub/shower bench, SLP-None recommended by SLP Equipment Details: PT- , OT-  Patient/family involved in discharge planning: PT- Patient,Family member/caregiver,  OT-Patient,Family member/caregiver, SLP-Patient,Family  member/caregiver  MD ELOS: 8-12 days. Medical Rehab Prognosis:  Good Assessment: 87 year old LH- female with history of T2DM, CHF, meningioma, DDD- Cervical spine s/p decompression, mesenteric ischemia with dumping syndrome (diarrhea after boluses), esophageal stricture --tube feed dependent who was [facility] to [facility] on 12/17[facility] with confusion, weakness and difficulty speaking.  She was hypoxic at admission and developed increased WOB requiring BIPAP, IV NTG and diuresis due to pulmonary edema/overaload. CT head done revealing acute/subacute L-MCA infarct.  MRI brain done revealing acute  infarct in deep insula and frontoparietal junction on left about 6 cm in diameter with mild swelling and minimal petechial blood.   CTA head/neck showed dsmall aneurysm v/s infundibulum supraclinoid R-ICA, left M2 MCA branch occlusion in acute infarct territory, moderate to severe stenosis of multiple proximal right M2 MCA branches, severe stenosis at R-ACA A2/A3 junction and moderate stenosis B-VA at skull base.   2D echo showed EF 35-40% with LV global kinesis, mild AVR and grade II DD. Neurology recommended permissive HTN and felt that incidental finding of aneurysm v/s infundibulum presented low change of hemorrhage and no intervention given advanced age.  DAPT added with recommendations of Plavix for 90 days. She was weaned to [facility] and Coreg resumed due to labile BP. Tube feeds changed with reports of increase tolerance of formula.  Patient with resultant mild right sided weakness with expressive deficits and balance deficits affecting functional status. Will set goals for supervision/Mod I with PT/OT/SLP.   Due to the current state of emergency, patients may not be receiving their 3-hours of Medicare-mandated therapy.  See Team Conference Notes for weekly updates to the plan of care

## 2020-12-22 NOTE — Discharge Summary (Addendum)
Physician Discharge Summary  [name] Day UKG:[id number] DOB: 1929-03-06 DOA: 12/17/2020  PCP: [name], MD  Admit date: 12/17/2020 Discharge date: 12/22/2020  Admitted From: Home  Disposition: CIR  Recommendations for Outpatient Follow-up:  1. Follow up with PCP in 1-2 weeks 2. Please obtain BMP/CBC in one week 3. Follow urine culture, to tailor antibiotics for UTI.  4. Follow up Lipid clinic for further management of Hyperlipidemia, patient has allergies to statins.  5. Follow up with neurologist post stroke.  6. She will need event monitor. Needs to follow up with Dr Clayborn Bigness in 2-3 weeks.  7. Titrate and resume HF medications slowly to avoid hypotension.  8. Needs aspirin life long and plavix for 90 day course for stroke.  9. Continue with Tube feedings.      Discharge Condition: Stable.  CODE STATUS: Full Code.  Diet recommendation: NPO, on tube feeding for history of esophageal stenosis.   Brief/Interim Summary: Patient was admitted by critical care team on 12/17/2020 for acute hypoxic respiratory failure requiring BiPAP.  Patient was also found to have acute stroke in the left frontoparietal region.  Patient has right-sided weakness.  Patient was also evaluated by neurosurgery for an aneurysm is seen on CT angio, no intervention recommended at this time.    1-Acute infarct frontoparietal junction of the left with right-sided weakness: Left MCA infarct.  -Patient has minimal petechial blood on MRI.  Repeated CT scan head is stable. -Patient was a started on aspirin and Plavix recommended by neurologist. -LDL 112, not on statins due to allergies to statins. Needs referral to lipid clinic.  -Awaiting CIR -Continue with aspirin lifelong and Plavix for 90 days per neurology recommendation. Patient was on Plavix prior to admission, she will need to follow up with her cardiology to determine If cardiology wishes to continue with Plavix for more than 90 days.   -Cardiology consulted for event monitor arrangement.   2-Acute on chronic  Systolic Heart Failure,, need to follow-up with cardiology as an outpatient. -Continue with Coreg and low-dose Lasix. -Ejection fraction 35 to 40%. Prior Ef 25 % -Needs to follow up with cardiology.  -Medications has been adjusted to avoid hypotension in setting of stroke. Holding lisinopril, spironolactone. Resume medications slowly as BP tolerates it.   3-Acute hypoxic respiratory failure, initially required BiPAP, oxygen saturation was 68 on room air. This has resolved. Received IV lasix initially.  Presume secondary to Acute systolic HF exacerbation.  on low dos oral lasix.   4-Diabetes type 2: Hemoglobin A1c 6.9, diet controlled.  A sliding scale while inpatient.  5-Restless leg syndrome: on Requip 6-Incidental aneurysm seen on imaging: Evaluated by neurosurgery recommended against intervention at this time.  7-Nutrition: Continue with tube feedings.  Patient has had PEG tube for almost 3 years.  She had PEG tube for esophageal stricture, amenable to dilation.  8-Chronic kidney disease a stage IIIa: Monitor 9-Moderate malnutrition: Continue with tube feedings 10-Constipation: Continue with MiraLAX and Senokot 11-UTI; Was complaining of dysuria. UA with positive nitrates and more than 50 WBC> started  IV antibiotics, ceftriaxone. day 2, please follow urine culture.     Discharge Diagnoses:  Active Problems:   Type 2 diabetes mellitus with hyperlipidemia (HCC)   Acute respiratory failure (HCC)   Cerebrovascular accident (CVA) due to occlusion of left middle cerebral artery (HCC)   Right sided weakness   Weakness   Abdominal pain, lower    Discharge Instructions  Discharge Instructions    AMB Referral to Advanced  Lipid Disorders Clinic   Complete by: As directed    Internal Lipid Clinic Referral Scheduling  Internal lipid clinic referrals are providers within [facility], who wish to refer  established patients for routine management (help in starting PCSK9 inhibitor therapy) or advanced therapies.  Internal MD referral criteria:              1. All patients with LDL>190 mg/dL  2. All patients with Triglycerides >500 mg/dL  3. Patients with suspected or confirmed heterozygous familial hyperlipidemia (HeFH) or homozygous familial hyperlipidemia (HoFH)  4. Patients with family history of suspicious for genetic dyslipidemia desiring genetic testing  5. Patients refractory to standard guideline based therapy  6. Patients with statin intolerance (failed 2 statins, one of which must be a high potency statin)  7. Patients who the provider desires to be seen by MD   Internal PharmD referral criteria:   1. Follow-up patients for medication management  2. Follow-up for compliance monitoring  3. Patients for drug education  4. Patients with statin intolerance  5. PCSK9 inhibitor education and prior authorization approvals  6. Patients with triglycerides <500 mg/dL  External Lipid Clinic Referral  External lipid clinic referrals are for providers outside of [facility], considered new clinic patients - automatically routed to MD schedule   Diet - low sodium heart healthy   Complete by: As directed    Increase activity slowly   Complete by: As directed      Allergies as of 12/22/2020      Reactions   Elemental Sulfur Diarrhea, Nausea And Vomiting   Gabapentin Swelling   Lipitor [atorvastatin] Other (See Comments)   Muscle aches   Mevacor [lovastatin] Other (See Comments)   Muscle aches   Milk-related Compounds Other (See Comments)   Large quantities cause headaches    Septra [sulfamethoxazole-trimethoprim] Other (See Comments)   Unknown   Statins Other (See Comments)   Muscle pain   Zocor [simvastatin] Other (See Comments)   Muscle aches      Medication List    STOP taking these medications   furosemide 10 MG/ML solution Commonly known as: LASIX Replaced by:  furosemide 20 MG tablet   glipiZIDE 5 MG tablet Commonly known as: GLUCOTROL   hydrALAZINE 25 MG tablet Commonly known as: APRESOLINE   lisinopril 40 MG tablet Commonly known as: ZESTRIL   loperamide 1 MG/5ML solution Commonly known as: IMODIUM   Pedialyte Soln   spironolactone 25 MG tablet Commonly known as: ALDACTONE     TAKE these medications   acetaminophen 160 MG/5ML solution Commonly known as: TYLENOL Place 20.3 mLs (650 mg total) into feeding tube every 6 (six) hours as needed for mild pain.   allopurinol 100 MG tablet Commonly known as: ZYLOPRIM Place 100 mg into feeding tube daily.   ascorbic acid 250 MG tablet Commonly known as: VITAMIN C Place 1 tablet (250 mg total) into feeding tube 2 (two) times daily.   aspirin 81 MG chewable tablet Place 1 tablet (81 mg total) into feeding tube daily.   carvedilol 3.125 MG tablet Commonly known as: COREG Place 1 tablet (3.125 mg total) into feeding tube 2 (two) times daily with a meal. What changed:   medication strength  how much to take  how to take this   cefTRIAXone 1 g in sodium chloride 0.9 % 100 mL Inject 1 g into the vein daily.   clopidogrel 75 MG tablet Commonly known as: PLAVIX GIVE 1 TABLET VIA FEEDING TUBE EVERY DAY AS  DIRECTED What changed: See the new instructions.   dextromethorphan-guaiFENesin 30-600 MG 12hr tablet Commonly known as: MUCINEX DM Take 1 tablet by mouth 2 (two) times daily as needed for cough.   docusate 50 MG/5ML liquid Commonly known as: COLACE Place 5 mLs (50 mg total) into feeding tube 2 (two) times daily.   feeding supplement (PRO-STAT SUGAR FREE 64) Liqd Place 30 mLs into feeding tube daily.   feeding supplement (PROSource TF) liquid Place 45 mLs into feeding tube daily. What changed:   how much to take  when to take this   feeding supplement (KATE FARMS STANDARD 1.4) Liqd liquid Place 325 mLs into feeding tube 3 (three) times daily. What changed: You were  already taking a medication with the same name, and this prescription was added. Make sure you understand how and when to take each.   free water Soln Place 30 mLs into feeding tube every 4 (four) hours.   furosemide 20 MG tablet Commonly known as: LASIX Place 0.5 tablets (10 mg total) into feeding tube daily. Replaces: furosemide 10 MG/ML solution   Hypromellose 0.2 % Soln Place 1 drop into both eyes 3 (three) times daily as needed (dry eyes).   meclizine 25 MG tablet Commonly known as: ANTIVERT Place 25 mg into feeding tube as needed for dizziness.   multivitamin Liqd Place 15 mLs into feeding tube daily.   polyethylene glycol 17 g packet Commonly known as: MIRALAX / GLYCOLAX Place 17 g into feeding tube daily.   rOPINIRole 2 MG tablet Commonly known as: REQUIP Place 1 tablet (2 mg total) into feeding tube 2 (two) times daily.       Allergies  Allergen Reactions  . Elemental Sulfur Diarrhea and Nausea And Vomiting  . Gabapentin Swelling  . Lipitor [Atorvastatin] Other (See Comments)    Muscle aches  . Mevacor [Lovastatin] Other (See Comments)    Muscle aches  . Milk-Related Compounds Other (See Comments)    Large quantities cause headaches   . Septra [Sulfamethoxazole-Trimethoprim] Other (See Comments)    Unknown  . Statins Other (See Comments)    Muscle pain  . Zocor [Simvastatin] Other (See Comments)    Muscle aches    Consultations:  Neurology    Procedures/Studies: CT Angio Head W or Wo Contrast  Addendum Date: 12/17/2020   ADDENDUM REPORT: 12/17/2020 19:09 ADDENDUM: Finding omitted from the impression: 2 mm vascular protrusion arising from the supraclinoid right ICA, which may reflect an infundibulum or aneurysm. These results will be called to the ordering clinician or representative by the Radiologist Assistant, and communication documented in the PACS or [employer]. Electronically Signed   By: Kellie Simmering DO   On: 12/17/2020 19:09   Result  Date: 12/17/2020 CLINICAL DATA:  Stroke/TIA, assess intracranial arteries. EXAM: CT ANGIOGRAPHY HEAD AND NECK CT PERFUSION BRAIN TECHNIQUE: Multidetector CT imaging of the head and neck was performed using the standard protocol during bolus administration of intravenous contrast. Multiplanar CT image reconstructions and MIPs were obtained to evaluate the vascular anatomy. Carotid stenosis measurements (when applicable) are obtained utilizing NASCET criteria, using the distal internal carotid diameter as the denominator. Multiphase CT imaging of the brain was performed following IV bolus contrast injection. Subsequent parametric perfusion maps were calculated using RAPID software. CONTRAST:  18mL OMNIPAQUE IOHEXOL 350 MG/ML SOLN COMPARISON:  Noncontrast head CT performed earlier today 12/17/2020. Brain MRI 06/08/2008. FINDINGS: CTA NECK FINDINGS Aortic arch: Standard aortic branching. Atherosclerotic plaque within the visualized aortic arch and proximal major  branch vessels of the neck. No hemodynamically significant innominate or proximal right subclavian artery stenosis. Prominent soft and calcified plaque within the proximal left subclavian artery with apparent 80% stenosis (however, this stenosis could be accentuated by blooming artifact from calcified plaque) (series 6, image 67). Right carotid system: CCA and ICA patent within the neck. Soft and calcified plaque within the distal CCA, carotid bifurcation and proximal ICA. Estimated 60% stenosis of the proximal ICA. Left carotid system: CCA and ICA patent within the neck without significant stenosis (50% or greater). Mild soft and calcified plaque within the mid to distal CCA. Moderate predominantly calcified plaque within the carotid bifurcation and proximal ICA. Vertebral arteries: Codominant patent within the neck. Nonstenotic soft and calcified plaque at the origin of the right vertebral artery. Prominent calcified plaque at the origin of the left vertebral  artery with suspected moderate stenosis. Skeleton: No acute bony abnormality or aggressive osseous lesion. Prior C5-C7 ACDF. Cervical spondylosis. Other neck: No neck mass or cervical lymphadenopathy. Upper chest: Ground-glass opacity within the imaged lung apices likely reflecting edema. Partially imaged bilateral pleural effusions Review of the MIP images confirms the above findings CTA HEAD FINDINGS Anterior circulation: The intracranial internal carotid arteries are patent. Severe stenosis of the supraclinoid left ICA. Moderate/severe stenosis of the supraclinoid right ICA. The M1 middle cerebral arteries are patent. There is a distal left M2 branch occlusion within the infarction territory (series 11, image 28). There is a high-grade stenosis within an adjacent distal left M2 branch (series 11, image 28). Additionally, there are foci of high-grade stenosis within multiple proximal left M2 vessels (series 9, image 47). Moderate/severe stenoses within multiple proximal right M2 branch vessels (series 10, image 17) (series 11, image 14). The anterior cerebral arteries are patent. High-grade focal stenosis within the right anterior cerebral artery at the A2/A3 junction (series 11, image 21). 2 mm vascular protrusion arising from the supraclinoid right ICA which may reflect an infundibulum or aneurysm (series 6, image 228). Posterior circulation: Calcified plaque within the bilateral vertebral arteries at the skull base level and at the level of the proximal V4 segments with moderate stenosis bilaterally. The basilar artery is patent. The posterior cerebral arteries are patent. The posterior communicating arteries are hypoplastic or absent bilaterally. Venous sinuses: Within the limitations of contrast timing, no convincing thrombus. Anatomic variants: As described Review of the MIP images confirms the above findings CT Brain Perfusion Findings: CBF (<30%) Volume: 52mL Perfusion (Tmax>6.0s) volume: 50mL Mismatch  Volume: 74mL Infarction Location:Left MCA vascular territory Emergent findings discussed with Dr. Parke Simmers at 4:18 p.m. on 12/17/2020 IMPRESSION: CTA neck: 1. The common carotid and internal carotid arteries are patent within the neck. 2. Estimated 60% atherosclerotic stenosis at the origin of the right ICA. 3. Vertebral arteries patent within the neck. Prominent calcified plaque at the origin of the left vertebral artery with suspected moderate stenosis at this site. 4. Apparent 80% atherosclerotic stenosis within the proximal left subclavian artery. This stenosis could potentially be accentuated by blooming artifact from prominent calcified plaque at this site. CTA head: 1. Distal left M2 MCA branch occlusion in the acute infarction territory. 2. High-grade stenosis within an adjacent distal left M2 MCA branch vessel. 3. High-grade stenoses within multiple proximal left M2 MCA branch vessels. 4. Moderate/severe stenoses within multiple proximal right M2 MCA branch vessels. 5. Severe stenosis of the supraclinoid left ICA. 6. Moderate/severe stenosis of the supraclinoid 7. Severe stenosis within the right anterior cerebral artery at the A2/A3 junction. 8.  Moderate stenosis within the bilateral vertebral arteries at the level of the skull base and proximal V4 segments CT perfusion head: The perfusion software identifies a 14 mL core infarct within the left MCA vascular territory. The perfusion software identifies a 28 mL region of hypoperfused parenchyma within the left MCA vascular territory. Reported mismatch volume: 14 mL. Electronically Signed: By: Kellie Simmering DO On: 12/17/2020 16:58   DG Chest 2 View  Result Date: 12/17/2020 CLINICAL DATA:  Altered mental status. Additional history provided: Low oxygen level, history of CHF, arrhythmia, hypertension. EXAM: CHEST - 2 VIEW COMPARISON:  Prior chest radiographs 11/23/2019 and earlier. FINDINGS: Unchanged cardiomegaly. Aortic atherosclerosis. Bilateral  interstitial and ill-defined airspace opacities are similar to the prior examination of 11/23/2019. Small left pleural effusion. No evidence of pneumothorax. No acute bony abnormality identified. ACDF hardware within the lower cervical spine. IMPRESSION: Bilateral interstitial and ill-defined airspace opacities are similar to the prior examination of 11/23/2019 and likely reflect pulmonary edema. Infection cannot be excluded and clinical correlation is recommended. Small left pleural effusion. Unchanged cardiomegaly. Aortic Atherosclerosis (ICD10-I70.0). Electronically Signed   By: Kellie Simmering DO   On: 12/17/2020 14:47   CT HEAD WO CONTRAST  Result Date: 12/20/2020 CLINICAL DATA:  87 year old female status post left MCA infarct. EXAM: CT HEAD WITHOUT CONTRAST TECHNIQUE: Contiguous axial images were obtained from the base of the skull through the vertex without intravenous contrast. COMPARISON:  Brain MRI 12/18/2020 and earlier. FINDINGS: Brain: Cytotoxic edema centered at the posterior left operculum is stable from the prior CT and MRI. Mild regional mass effect. No associated hemorrhage. No midline shift or ventriculomegaly. Stable gray-white matter differentiation elsewhere. No new cytotoxic edema. Vascular: Extensive Calcified atherosclerosis at the skull base. Skull: No acute osseous abnormality identified. Upper cervical spine degeneration. Sinuses/Orbits: Visualized paranasal sinuses and mastoids are stable and well pneumatized. Other: No acute orbit or scalp soft tissue finding. Calcified scalp vessel atherosclerosis. IMPRESSION: 1. Stable CT appearance of the left MCA territory infarct. No hemorrhage or significant mass effect. 2. No new intracranial abnormality. Electronically Signed   By: Genevie Ann M.D.   On: 12/20/2020 11:00   CT HEAD WO CONTRAST  Result Date: 12/17/2020 CLINICAL DATA:  Transient ischemic attack (TIA). EXAM: CT HEAD WITHOUT CONTRAST TECHNIQUE: Contiguous axial images were obtained  from the base of the skull through the vertex without intravenous contrast. COMPARISON:  Brain MRI 06/08/2008. FINDINGS: Brain: Mild generalized cerebral atrophy. 4.9 x 3.3 x 5.4 cm acute to subacute appearing cortical and subcortical left MCA territory infarct within the lateral left frontoparietal lobes, posterosuperior left temporal lobe and left insula. No evidence of hemorrhagic conversion. No significant mass effect. Background mild patchy and ill-defined hypoattenuation within the cerebral white matter is nonspecific, but compatible chronic small vessel ischemic disease. No extra-axial fluid collection. No evidence of intracranial mass. No midline shift. Vascular: No hyperdense vessel.  Atherosclerotic calcifications Skull: Normal. Negative for fracture or focal lesion. Sinuses/Orbits: Visualized orbits show no acute finding. Right sphenoid sinus air-fluid level. Mild mucosal thickening within the left sphenoid sinus. Mild bilateral ethmoid sinus mucosal thickening. These results were called by telephone at the time of interpretation on 12/17/2020 at 2:43 pm to provider Dr. Charna Archer, who verbally acknowledged these results. IMPRESSION: 4.9 x 3.3 x 5.4 cm acute/subacute appearing cortical and subcortical left MCA territory infarct within the left frontoparietal and left temporal lobes as well as left insula. No evidence of hemorrhagic conversion. No significant mass effect. Background mild cerebral atrophy and chronic  small vessel ischemic disease. Paranasal sinus disease as described. Correlate for acute sinusitis. Electronically Signed   By: Kellie Simmering DO   On: 12/17/2020 14:44   CT Angio Neck W and/or Wo Contrast  Addendum Date: 12/17/2020   ADDENDUM REPORT: 12/17/2020 19:09 ADDENDUM: Finding omitted from the impression: 2 mm vascular protrusion arising from the supraclinoid right ICA, which may reflect an infundibulum or aneurysm. These results will be called to the ordering clinician or representative  by the Radiologist Assistant, and communication documented in the PACS or [employer]. Electronically Signed   By: Kellie Simmering DO   On: 12/17/2020 19:09   Result Date: 12/17/2020 CLINICAL DATA:  Stroke/TIA, assess intracranial arteries. EXAM: CT ANGIOGRAPHY HEAD AND NECK CT PERFUSION BRAIN TECHNIQUE: Multidetector CT imaging of the head and neck was performed using the standard protocol during bolus administration of intravenous contrast. Multiplanar CT image reconstructions and MIPs were obtained to evaluate the vascular anatomy. Carotid stenosis measurements (when applicable) are obtained utilizing NASCET criteria, using the distal internal carotid diameter as the denominator. Multiphase CT imaging of the brain was performed following IV bolus contrast injection. Subsequent parametric perfusion maps were calculated using RAPID software. CONTRAST:  173mL OMNIPAQUE IOHEXOL 350 MG/ML SOLN COMPARISON:  Noncontrast head CT performed earlier today 12/17/2020. Brain MRI 06/08/2008. FINDINGS: CTA NECK FINDINGS Aortic arch: Standard aortic branching. Atherosclerotic plaque within the visualized aortic arch and proximal major branch vessels of the neck. No hemodynamically significant innominate or proximal right subclavian artery stenosis. Prominent soft and calcified plaque within the proximal left subclavian artery with apparent 80% stenosis (however, this stenosis could be accentuated by blooming artifact from calcified plaque) (series 6, image 67). Right carotid system: CCA and ICA patent within the neck. Soft and calcified plaque within the distal CCA, carotid bifurcation and proximal ICA. Estimated 60% stenosis of the proximal ICA. Left carotid system: CCA and ICA patent within the neck without significant stenosis (50% or greater). Mild soft and calcified plaque within the mid to distal CCA. Moderate predominantly calcified plaque within the carotid bifurcation and proximal ICA. Vertebral arteries: Codominant  patent within the neck. Nonstenotic soft and calcified plaque at the origin of the right vertebral artery. Prominent calcified plaque at the origin of the left vertebral artery with suspected moderate stenosis. Skeleton: No acute bony abnormality or aggressive osseous lesion. Prior C5-C7 ACDF. Cervical spondylosis. Other neck: No neck mass or cervical lymphadenopathy. Upper chest: Ground-glass opacity within the imaged lung apices likely reflecting edema. Partially imaged bilateral pleural effusions Review of the MIP images confirms the above findings CTA HEAD FINDINGS Anterior circulation: The intracranial internal carotid arteries are patent. Severe stenosis of the supraclinoid left ICA. Moderate/severe stenosis of the supraclinoid right ICA. The M1 middle cerebral arteries are patent. There is a distal left M2 branch occlusion within the infarction territory (series 11, image 28). There is a high-grade stenosis within an adjacent distal left M2 branch (series 11, image 28). Additionally, there are foci of high-grade stenosis within multiple proximal left M2 vessels (series 9, image 47). Moderate/severe stenoses within multiple proximal right M2 branch vessels (series 10, image 17) (series 11, image 14). The anterior cerebral arteries are patent. High-grade focal stenosis within the right anterior cerebral artery at the A2/A3 junction (series 11, image 21). 2 mm vascular protrusion arising from the supraclinoid right ICA which may reflect an infundibulum or aneurysm (series 6, image 228). Posterior circulation: Calcified plaque within the bilateral vertebral arteries at the skull base level and at  the level of the proximal V4 segments with moderate stenosis bilaterally. The basilar artery is patent. The posterior cerebral arteries are patent. The posterior communicating arteries are hypoplastic or absent bilaterally. Venous sinuses: Within the limitations of contrast timing, no convincing thrombus. Anatomic  variants: As described Review of the MIP images confirms the above findings CT Brain Perfusion Findings: CBF (<30%) Volume: 86mL Perfusion (Tmax>6.0s) volume: 27mL Mismatch Volume: 55mL Infarction Location:Left MCA vascular territory Emergent findings discussed with Dr. Parke Simmers at 4:18 p.m. on 12/17/2020 IMPRESSION: CTA neck: 1. The common carotid and internal carotid arteries are patent within the neck. 2. Estimated 60% atherosclerotic stenosis at the origin of the right ICA. 3. Vertebral arteries patent within the neck. Prominent calcified plaque at the origin of the left vertebral artery with suspected moderate stenosis at this site. 4. Apparent 80% atherosclerotic stenosis within the proximal left subclavian artery. This stenosis could potentially be accentuated by blooming artifact from prominent calcified plaque at this site. CTA head: 1. Distal left M2 MCA branch occlusion in the acute infarction territory. 2. High-grade stenosis within an adjacent distal left M2 MCA branch vessel. 3. High-grade stenoses within multiple proximal left M2 MCA branch vessels. 4. Moderate/severe stenoses within multiple proximal right M2 MCA branch vessels. 5. Severe stenosis of the supraclinoid left ICA. 6. Moderate/severe stenosis of the supraclinoid 7. Severe stenosis within the right anterior cerebral artery at the A2/A3 junction. 8. Moderate stenosis within the bilateral vertebral arteries at the level of the skull base and proximal V4 segments CT perfusion head: The perfusion software identifies a 14 mL core infarct within the left MCA vascular territory. The perfusion software identifies a 28 mL region of hypoperfused parenchyma within the left MCA vascular territory. Reported mismatch volume: 14 mL. Electronically Signed: By: Kellie Simmering DO On: 12/17/2020 16:58   MR BRAIN W WO CONTRAST  Result Date: 12/18/2020 CLINICAL DATA:  Left MCA branch vessel stroke presentation yesterday. EXAM: MRI HEAD WITHOUT AND WITH  CONTRAST TECHNIQUE: Multiplanar, multiecho pulse sequences of the brain and surrounding structures were obtained without and with intravenous contrast. CONTRAST:  96mL GADAVIST GADOBUTROL 1 MMOL/ML IV SOLN COMPARISON:  CT studies yesterday. FINDINGS: Brain: Diffusion imaging shows acute infarction in the deep insula and frontoparietal junction region on the left, the region of infarction measuring up to 6 cm in diameter. Mild swelling. Minimal petechial blood products suggested on the susceptibility weighted imaging, but no frank hematoma. No mass effect or shift. Brainstem is normal. Few old small vessel cerebellar infarctions. Old small vessel infarction in both thalami. Mild for age chronic small vessel ischemic change within the hemispheric white matter. No hydrocephalus. No extra-axial collection. No mass lesion. Vascular: Major vessels at the base of the brain show flow. Skull and upper cervical spine: Negative Sinuses/Orbits: Clear/normal Other: None IMPRESSION: 1. Acute infarction in the deep insula and frontoparietal junction region on the left measuring up to 6 cm in diameter. Mild swelling. Minimal petechial blood products suggested on the susceptibility weighted imaging, but no frank hematoma. No mass effect or shift. 2. Mild for age chronic small-vessel ischemic changes elsewhere throughout the brain as outlined above. Electronically Signed   By: Nelson Chimes M.D.   On: 12/18/2020 12:16   CT CEREBRAL PERFUSION W CONTRAST  Addendum Date: 12/17/2020   ADDENDUM REPORT: 12/17/2020 19:09 ADDENDUM: Finding omitted from the impression: 2 mm vascular protrusion arising from the supraclinoid right ICA, which may reflect an infundibulum or aneurysm. These results will be called to the ordering  clinician or representative by the Radiologist Assistant, and communication documented in the PACS or [employer]. Electronically Signed   By: Kellie Simmering DO   On: 12/17/2020 19:09   Result Date:  12/17/2020 CLINICAL DATA:  Stroke/TIA, assess intracranial arteries. EXAM: CT ANGIOGRAPHY HEAD AND NECK CT PERFUSION BRAIN TECHNIQUE: Multidetector CT imaging of the head and neck was performed using the standard protocol during bolus administration of intravenous contrast. Multiplanar CT image reconstructions and MIPs were obtained to evaluate the vascular anatomy. Carotid stenosis measurements (when applicable) are obtained utilizing NASCET criteria, using the distal internal carotid diameter as the denominator. Multiphase CT imaging of the brain was performed following IV bolus contrast injection. Subsequent parametric perfusion maps were calculated using RAPID software. CONTRAST:  176mL OMNIPAQUE IOHEXOL 350 MG/ML SOLN COMPARISON:  Noncontrast head CT performed earlier today 12/17/2020. Brain MRI 06/08/2008. FINDINGS: CTA NECK FINDINGS Aortic arch: Standard aortic branching. Atherosclerotic plaque within the visualized aortic arch and proximal major branch vessels of the neck. No hemodynamically significant innominate or proximal right subclavian artery stenosis. Prominent soft and calcified plaque within the proximal left subclavian artery with apparent 80% stenosis (however, this stenosis could be accentuated by blooming artifact from calcified plaque) (series 6, image 67). Right carotid system: CCA and ICA patent within the neck. Soft and calcified plaque within the distal CCA, carotid bifurcation and proximal ICA. Estimated 60% stenosis of the proximal ICA. Left carotid system: CCA and ICA patent within the neck without significant stenosis (50% or greater). Mild soft and calcified plaque within the mid to distal CCA. Moderate predominantly calcified plaque within the carotid bifurcation and proximal ICA. Vertebral arteries: Codominant patent within the neck. Nonstenotic soft and calcified plaque at the origin of the right vertebral artery. Prominent calcified plaque at the origin of the left vertebral  artery with suspected moderate stenosis. Skeleton: No acute bony abnormality or aggressive osseous lesion. Prior C5-C7 ACDF. Cervical spondylosis. Other neck: No neck mass or cervical lymphadenopathy. Upper chest: Ground-glass opacity within the imaged lung apices likely reflecting edema. Partially imaged bilateral pleural effusions Review of the MIP images confirms the above findings CTA HEAD FINDINGS Anterior circulation: The intracranial internal carotid arteries are patent. Severe stenosis of the supraclinoid left ICA. Moderate/severe stenosis of the supraclinoid right ICA. The M1 middle cerebral arteries are patent. There is a distal left M2 branch occlusion within the infarction territory (series 11, image 28). There is a high-grade stenosis within an adjacent distal left M2 branch (series 11, image 28). Additionally, there are foci of high-grade stenosis within multiple proximal left M2 vessels (series 9, image 47). Moderate/severe stenoses within multiple proximal right M2 branch vessels (series 10, image 17) (series 11, image 14). The anterior cerebral arteries are patent. High-grade focal stenosis within the right anterior cerebral artery at the A2/A3 junction (series 11, image 21). 2 mm vascular protrusion arising from the supraclinoid right ICA which may reflect an infundibulum or aneurysm (series 6, image 228). Posterior circulation: Calcified plaque within the bilateral vertebral arteries at the skull base level and at the level of the proximal V4 segments with moderate stenosis bilaterally. The basilar artery is patent. The posterior cerebral arteries are patent. The posterior communicating arteries are hypoplastic or absent bilaterally. Venous sinuses: Within the limitations of contrast timing, no convincing thrombus. Anatomic variants: As described Review of the MIP images confirms the above findings CT Brain Perfusion Findings: CBF (<30%) Volume: 61mL Perfusion (Tmax>6.0s) volume: 59mL Mismatch  Volume: 49mL Infarction Location:Left MCA vascular territory Emergent  findings discussed with Dr. Parke Simmers at 4:18 p.m. on 12/17/2020 IMPRESSION: CTA neck: 1. The common carotid and internal carotid arteries are patent within the neck. 2. Estimated 60% atherosclerotic stenosis at the origin of the right ICA. 3. Vertebral arteries patent within the neck. Prominent calcified plaque at the origin of the left vertebral artery with suspected moderate stenosis at this site. 4. Apparent 80% atherosclerotic stenosis within the proximal left subclavian artery. This stenosis could potentially be accentuated by blooming artifact from prominent calcified plaque at this site. CTA head: 1. Distal left M2 MCA branch occlusion in the acute infarction territory. 2. High-grade stenosis within an adjacent distal left M2 MCA branch vessel. 3. High-grade stenoses within multiple proximal left M2 MCA branch vessels. 4. Moderate/severe stenoses within multiple proximal right M2 MCA branch vessels. 5. Severe stenosis of the supraclinoid left ICA. 6. Moderate/severe stenosis of the supraclinoid 7. Severe stenosis within the right anterior cerebral artery at the A2/A3 junction. 8. Moderate stenosis within the bilateral vertebral arteries at the level of the skull base and proximal V4 segments CT perfusion head: The perfusion software identifies a 14 mL core infarct within the left MCA vascular territory. The perfusion software identifies a 28 mL region of hypoperfused parenchyma within the left MCA vascular territory. Reported mismatch volume: 14 mL. Electronically Signed: By: Kellie Simmering DO On: 12/17/2020 16:58   DG Chest Portable 1 View  Result Date: 12/17/2020 CLINICAL DATA:  Weakness, shortness of breath and hypotension. EXAM: PORTABLE CHEST 1 VIEW COMPARISON:  PA and lateral chest 12/17/2020. FINDINGS: There is cardiomegaly and pulmonary edema. Aeration appears improved. No focal airspace disease. Small bilateral pleural effusions,  greater on the left. IMPRESSION: Improved aeration compared to the study earlier today. Cardiomegaly, interstitial edema and small left effusion. Electronically Signed   By: Inge Rise M.D.   On: 12/17/2020 17:40   ECHOCARDIOGRAM COMPLETE  Result Date: 12/19/2020    ECHOCARDIOGRAM REPORT   Patient Name:   LARYSSA HASSING BOOTH DAY Date of Exam: 12/18/2020 Medical Rec #:  [id number]        Height:       67.0 in Accession #:    [id number]       Weight:       125.0 lb Date of Birth:  07/22/1929       BSA:          1.656 m Patient Age:    59 years         BP:           152/48 mmHg Patient Gender: F                HR:           58 bpm. Exam Location:  [facility] Procedure: 2D Echo Indications:     Stroke I63.9  History:         Patient has prior history of Echocardiogram examinations, most                  recent 11/24/2019.  Sonographer:     Arville Go RDCS Referring Phys:  [id number] Dalton L R[place]CHESTER Diagnosing Phys: Serafina Royals MD IMPRESSIONS  1. Left ventricular ejection fraction, by estimation, is 35 to 40%. The left ventricle has moderately decreased function. The left ventricle demonstrates global hypokinesis. The left ventricular internal cavity size was mildly dilated. There is mild left ventricular hypertrophy. Left ventricular diastolic parameters are consistent with Grade II diastolic dysfunction (pseudonormalization).  2. Right ventricular systolic  function is normal. The right ventricular size is normal.  3. Left atrial size was moderately dilated.  4. Right atrial size was mildly dilated.  5. The mitral valve is normal in structure. Moderate to severe mitral valve regurgitation.  6. The aortic valve is calcified. Aortic valve regurgitation is mild. Moderate aortic valve stenosis. FINDINGS  Left Ventricle: Left ventricular ejection fraction, by estimation, is 35 to 40%. The left ventricle has moderately decreased function. The left ventricle demonstrates global hypokinesis. The left ventricular internal  cavity size was mildly dilated. There is mild left ventricular hypertrophy. Left ventricular diastolic parameters are consistent with Grade II diastolic dysfunction (pseudonormalization). Right Ventricle: The right ventricular size is normal. No increase in right ventricular wall thickness. Right ventricular systolic function is normal. Left Atrium: Left atrial size was moderately dilated. Right Atrium: Right atrial size was mildly dilated. Pericardium: There is no evidence of pericardial effusion. Mitral Valve: The mitral valve is normal in structure. Moderate to severe mitral valve regurgitation. Tricuspid Valve: The tricuspid valve is normal in structure. Tricuspid valve regurgitation is mild. Aortic Valve: The aortic valve is calcified. Aortic valve regurgitation is mild. Aortic regurgitation PHT measures 668 msec. Moderate aortic stenosis is present. Aortic valve mean gradient measures 16.5 mmHg. Aortic valve peak gradient measures 29.4 mmHg. Aortic valve area, by VTI measures 0.56 cm. Pulmonic Valve: The pulmonic valve was normal in structure. Pulmonic valve regurgitation is trivial. Aorta: The aortic root and ascending aorta are structurally normal, with no evidence of dilitation. IAS/Shunts: No atrial level shunt detected by color flow Doppler.  LEFT VENTRICLE PLAX 2D LVIDd:         5.13 cm      Diastology LVIDs:         4.04 cm      LV e' medial:    3.70 cm/s LV PW:         1.30 cm      LV E/e' medial:  30.5 LV IVS:        1.32 cm      LV e' lateral:   4.03 cm/s LVOT diam:     1.80 cm      LV E/e' lateral: 28.0 LV SV:         40 LV SV Index:   24 LVOT Area:     2.54 cm  LV Volumes (MOD) LV vol d, MOD A4C: 106.0 ml LV vol s, MOD A4C: 65.2 ml LV SV MOD A4C:     106.0 ml RIGHT VENTRICLE RV Basal diam:  3.29 cm RV S prime:     11.10 cm/s TAPSE (M-mode): 1.8 cm LEFT ATRIUM              Index       RIGHT ATRIUM           Index LA diam:        4.10 cm  2.48 cm/m  RA Area:     17.10 cm LA Vol (A2C):   110.0 ml  66.42 ml/m RA Volume:   50.30 ml  30.37 ml/m LA Vol (A4C):   65.7 ml  39.67 ml/m LA Biplane Vol: 85.8 ml  51.81 ml/m  AORTIC VALVE                    PULMONIC VALVE AV Area (Vmax):    0.59 cm     PV Vmax:       1.11 m/s AV Area (Vmean):   0.55 cm  PV Peak grad:  4.9 mmHg AV Area (VTI):     0.56 cm AV Vmax:           271.00 cm/s AV Vmean:          185.500 cm/s AV VTI:            0.718 m AV Peak Grad:      29.4 mmHg AV Mean Grad:      16.5 mmHg LVOT Vmax:         62.50 cm/s LVOT Vmean:        39.800 cm/s LVOT VTI:          0.159 m LVOT/AV VTI ratio: 0.22 AI PHT:            668 msec  AORTA Ao Root diam: 2.50 cm MITRAL VALVE                 TRICUSPID VALVE MV Area (PHT): 2.11 cm      TV Peak grad:   29.4 mmHg MV Decel Time: 360 msec      TV Vmax:        2.71 m/s MR Peak grad:    125.4 mmHg MR Mean grad:    74.0 mmHg   SHUNTS MR Vmax:         560.00 cm/s Systemic VTI:  0.16 m MR Vmean:        397.0 cm/s  Systemic Diam: 1.80 cm MR PISA:         2.26 cm MR PISA Eff ROA: 12 mm MR PISA Radius:  0.60 cm MV E velocity: 113.00 cm/s MV A velocity: 76.00 cm/s MV E/A ratio:  1.49 Serafina Royals MD Electronically signed by Serafina Royals MD Signature Date/Time: 12/19/2020/7:37:03 AM    Final     Subjective: She is alert, aphasia improving, dysarthria present.  She report improvement of dysuria.  Had a BM She is able to tolerate [employer] formula better.   Discharge Exam: Vitals:   12/22/20 0345 12/22/20 0700  BP: 119/73 124/78  Pulse: 61 64  Resp: 16 16  Temp: 98.7 F (37.1 C) 97.8 F (36.6 C)  SpO2: 99% 98%     General: Pt is alert, awake, not in acute distress Cardiovascular: RRR, S1/S2 +, no rubs, no gallops Respiratory: CTA bilaterally, no wheezing, no rhonchi Abdominal: Soft, NT, ND, bowel sounds + Extremities: no edema, no cyanosis    The results of significant diagnostics from this hospitalization (including imaging, microbiology, ancillary and laboratory) are listed below for  reference.     Microbiology: Recent Results (from the past 240 hour(s))  Culture, blood (routine x 2)     Status: None   Collection Time: 12/17/20  5:22 PM   Specimen: BLOOD  Result Value Ref Range Status   Specimen Description BLOOD BLOOD LEFT HAND  Final   Special Requests   Final    BOTTLES DRAWN AEROBIC AND ANAEROBIC Blood Culture results may not be optimal due to an inadequate volume of blood received in culture bottles   Culture   Final    NO GROWTH 5 DAYS Performed at [facility], [address], [place], [place] [address]    Report Status 12/22/2020 FINAL  Final  Culture, blood (routine x 2)     Status: None   Collection Time: 12/17/20  5:33 PM   Specimen: BLOOD  Result Value Ref Range Status   Specimen Description BLOOD BLOOD LEFT ARM  Final   Special Requests AEROBIC  BOTTLE ONLY Blood Culture adequate volume  Final   Culture   Final    NO GROWTH 5 DAYS Performed at [facility], [address] [place], [place] [address]    Report Status 12/22/2020 FINAL  Final  Resp Panel by RT-PCR (Flu A&B, Covid) Nasopharyngeal Swab     Status: None   Collection Time: 12/17/20  8:17 PM   Specimen: Nasopharyngeal Swab; Nasopharyngeal(NP) swabs in vial transport medium  Result Value Ref Range Status   SARS Coronavirus 2 by RT PCR NEGATIVE NEGATIVE Final    Comment: (NOTE) SARS-CoV-2 target nucleic acids are NOT DETECTED.  The SARS-CoV-2 RNA is generally detectable in upper respiratory specimens during the acute phase of infection. The lowest concentration of SARS-CoV-2 viral copies this assay can detect is 138 copies/mL. A negative result does not preclude SARS-Cov-2 infection and should not be used as the sole basis for treatment or other patient management decisions. A negative result may occur with  improper specimen collection/handling, submission of specimen other than nasopharyngeal swab, presence of viral mutation(s) within the areas targeted by this  assay, and inadequate number of viral copies(<138 copies/mL). A negative result must be combined with clinical observations, patient history, and epidemiological information. The expected result is Negative.  Fact Sheet for Patients:  EntrepreneurPulse.com.au  Fact Sheet for Healthcare Providers:  IncredibleEmployment.be  This test is no t yet approved or cleared by the [place] FDA and  has been authorized for detection and/or diagnosis of SARS-CoV-2 by FDA under an Emergency Use Authorization (EUA). This EUA will remain  in effect (meaning this test can be used) for the duration of the COVID-19 declaration under Section 564(b)(1) of the Act, 21 U.S.C.section 360bbb-3(b)(1), unless the authorization is terminated  or revoked sooner.       Influenza A by PCR NEGATIVE NEGATIVE Final   Influenza B by PCR NEGATIVE NEGATIVE Final    Comment: (NOTE) The Xpert Xpress SARS-CoV-2/FLU/RSV plus assay is intended as an aid in the diagnosis of influenza from Nasopharyngeal swab specimens and should not be used as a sole basis for treatment. Nasal washings and aspirates are unacceptable for Xpert Xpress SARS-CoV-2/FLU/RSV testing.  Fact Sheet for Patients: EntrepreneurPulse.com.au  Fact Sheet for Healthcare Providers: IncredibleEmployment.be  This test is not yet approved or cleared by the [place] FDA and has been authorized for detection and/or diagnosis of SARS-CoV-2 by FDA under an Emergency Use Authorization (EUA). This EUA will remain in effect (meaning this test can be used) for the duration of the COVID-19 declaration under Section 564(b)(1) of the Act, 21 U.S.C. section 360bbb-3(b)(1), unless the authorization is terminated or revoked.  Performed at [facility], [address] [place], [place] [address]      Labs: BNP (last 3 results) Recent Labs    12/17/20 1223  BNP 982.2*    Basic Metabolic Panel: Recent Labs  Lab 12/18/20 0444 12/19/20 0520 12/20/20 0414 12/21/20 0500 12/22/20 0438  NA 140 141 142 142 142  K 4.6 4.1 4.6 4.7 4.9  CL 105 109 110 108 108  CO2 27 24 24 27 30   GLUCOSE 113* 130* 266* 125* 116*  BUN 42* 42* 53* 55* 56*  CREATININE 1.11* 1.11* 1.05* 1.05* 1.05*  CALCIUM 8.3* 8.3* 8.3* 8.5* 8.4*  MG 2.2 2.7*  --   --   --   PHOS 4.0 3.5  --   --   --    Liver Function Tests: Recent Labs  Lab 12/17/20 1223  AST 19  ALT 15  ALKPHOS 56  BILITOT 1.0  PROT 6.9  ALBUMIN 3.8   No results for input(s): LIPASE, AMYLASE in the last 168 hours. Recent Labs  Lab 12/17/20 1434  AMMONIA 19   CBC: Recent Labs  Lab 12/17/20 1223 12/18/20 0444 12/19/20 0520  WBC 9.0  8.8 7.1 7.3  NEUTROABS 7.1  --   --   HGB 10.3*  10.3* 9.8* 10.1*  HCT 32.5*  32.9* 30.2* 31.2*  MCV 91.3  91.1 91.0 91.2  PLT 325  329 289 282   Cardiac Enzymes: No results for input(s): CKTOTAL, CKMB, CKMBINDEX, TROPONINI in the last 168 hours. BNP: Invalid input(s): POCBNP CBG: Recent Labs  Lab 12/21/20 1617 12/21/20 2035 12/22/20 0008 12/22/20 0347 12/22/20 0804  GLUCAP 117* 168* 208* 102* 133*   D-Dimer No results for input(s): DDIMER in the last 72 hours. Hgb A1c No results for input(s): HGBA1C in the last 72 hours. Lipid Profile No results for input(s): CHOL, HDL, LDLCALC, TRIG, CHOLHDL, LDLDIRECT in the last 72 hours. Thyroid function studies No results for input(s): TSH, T4TOTAL, T3FREE, THYROIDAB in the last 72 hours.  Invalid input(s): FREET3 Anemia work up No results for input(s): VITAMINB12, FOLATE, FERRITIN, TIBC, IRON, RETICCTPCT in the last 72 hours. Urinalysis    Component Value Date/Time   COLORURINE YELLOW (A) 12/21/2020 1550   APPEARANCEUR HAZY (A) 12/21/2020 1550   LABSPEC 1.015 12/21/2020 1550   PHURINE 7.0 12/21/2020 1550   GLUCOSEU NEGATIVE 12/21/2020 1550   HGBUR SMALL (A) 12/21/2020 1550   BILIRUBINUR NEGATIVE  12/21/2020 1550   KETONESUR NEGATIVE 12/21/2020 1550   PROTEINUR 30 (A) 12/21/2020 1550   NITRITE POSITIVE (A) 12/21/2020 1550   LEUKOCYTESUR LARGE (A) 12/21/2020 1550   Sepsis Labs Invalid input(s): PROCALCITONIN,  WBC,  LACTICIDVEN Microbiology Recent Results (from the past 240 hour(s))  Culture, blood (routine x 2)     Status: None   Collection Time: 12/17/20  5:22 PM   Specimen: BLOOD  Result Value Ref Range Status   Specimen Description BLOOD BLOOD LEFT HAND  Final   Special Requests   Final    BOTTLES DRAWN AEROBIC AND ANAEROBIC Blood Culture results may not be optimal due to an inadequate volume of blood received in culture bottles   Culture   Final    NO GROWTH 5 DAYS Performed at [facility], [address], [place], [place] [address]    Report Status 12/22/2020 FINAL  Final  Culture, blood (routine x 2)     Status: None   Collection Time: 12/17/20  5:33 PM   Specimen: BLOOD  Result Value Ref Range Status   Specimen Description BLOOD BLOOD LEFT ARM  Final   Special Requests AEROBIC BOTTLE ONLY Blood Culture adequate volume  Final   Culture   Final    NO GROWTH 5 DAYS Performed at [facility], [address] [place], [place] [address]    Report Status 12/22/2020 FINAL  Final  Resp Panel by RT-PCR (Flu A&B, Covid) Nasopharyngeal Swab     Status: None   Collection Time: 12/17/20  8:17 PM   Specimen: Nasopharyngeal Swab; Nasopharyngeal(NP) swabs in vial transport medium  Result Value Ref Range Status   SARS Coronavirus 2 by RT PCR NEGATIVE NEGATIVE Final    Comment: (NOTE) SARS-CoV-2 target nucleic acids are NOT DETECTED.  The SARS-CoV-2 RNA is generally detectable in upper respiratory specimens during the acute phase of infection. The lowest concentration of SARS-CoV-2 viral copies  this assay can detect is 138 copies/mL. A negative result does not preclude SARS-Cov-2 infection and should not be used as the sole basis for treatment or other  patient management decisions. A negative result may occur with  improper specimen collection/handling, submission of specimen other than nasopharyngeal swab, presence of viral mutation(s) within the areas targeted by this assay, and inadequate number of viral copies(<138 copies/mL). A negative result must be combined with clinical observations, patient history, and epidemiological information. The expected result is Negative.  Fact Sheet for Patients:  EntrepreneurPulse.com.au  Fact Sheet for Healthcare Providers:  IncredibleEmployment.be  This test is no t yet approved or cleared by the [place] FDA and  has been authorized for detection and/or diagnosis of SARS-CoV-2 by FDA under an Emergency Use Authorization (EUA). This EUA will remain  in effect (meaning this test can be used) for the duration of the COVID-19 declaration under Section 564(b)(1) of the Act, 21 U.S.C.section 360bbb-3(b)(1), unless the authorization is terminated  or revoked sooner.       Influenza A by PCR NEGATIVE NEGATIVE Final   Influenza B by PCR NEGATIVE NEGATIVE Final    Comment: (NOTE) The Xpert Xpress SARS-CoV-2/FLU/RSV plus assay is intended as an aid in the diagnosis of influenza from Nasopharyngeal swab specimens and should not be used as a sole basis for treatment. Nasal washings and aspirates are unacceptable for Xpert Xpress SARS-CoV-2/FLU/RSV testing.  Fact Sheet for Patients: EntrepreneurPulse.com.au  Fact Sheet for Healthcare Providers: IncredibleEmployment.be  This test is not yet approved or cleared by the [place] FDA and has been authorized for detection and/or diagnosis of SARS-CoV-2 by FDA under an Emergency Use Authorization (EUA). This EUA will remain in effect (meaning this test can be used) for the duration of the COVID-19 declaration under Section 564(b)(1) of the Act, 21 U.S.C. section  360bbb-3(b)(1), unless the authorization is terminated or revoked.  Performed at [facility], [address], [place], [place] [address]      Time coordinating discharge: 40 minutes  SIGNED:   Elmarie Shiley, MD  [facility]

## 2020-12-22 NOTE — Progress Notes (Signed)
Copy of POA in chart

## 2020-12-22 NOTE — TOC Transition Note (Signed)
Transition of Care [facility]) - CM/SW Discharge Note   Patient Details  Name: [name] Day MRN: [id number] Date of Birth: Aug 16, 1929  Transition of Care [facility]) CM/SW Contact:  Candie Chroman, LCSW Phone Number: 12/22/2020, 10:59 AM   Clinical Narrative:  Patient has orders to discharge to Inpatient Rehab at [facility] today. [facility] already been arranged. Transport paperwork is in the discharge packet. Patient and son at bedside have been updated. No further concerns. CSW signing off.   Final next level of care: IP Rehab Facility Barriers to Discharge: Barriers Resolved   Patient Goals and CMS Choice Patient states their goals for this hospitalization and ongoing recovery are:: PAtient would like to return home once she is better.      Discharge Placement                Patient to be transferred to facility by: [facility] Name of family member notified: Pilar Plate Day Patient and family notified of of transfer: 12/22/20  Discharge Plan and Services In-house Referral: Clinical Social Work                                   Social Determinants of Health (SDOH) Interventions     Readmission Risk Interventions Readmission Risk Prevention Plan 11/24/2019  Transportation Screening Complete  PCP or Specialist Appt within 3-5 Days Complete  HRI or Home Care Consult Complete  Medication Review (RN Care Manager) Complete  Some recent data might be hidden

## 2020-12-22 NOTE — Progress Notes (Signed)
Inpatient Rehabilitation Medication Review by a Pharmacist  A complete drug regimen review was completed for this patient to identify any potential clinically significant medication issues.  Clinically significant medication issues were identified:  No   Type of Medication Issue Identified Description of Issue Urgent (address now) Non-Urgent (address on AM team rounds) Plan Plan Accepted by Provider? (Yes / No / Pending AM Rounds)  Drug Interaction(s) (clinically significant)       Duplicate Therapy       Allergy       No Medication Administration End Date       Incorrect Dose       Additional Drug Therapy Needed       Other  The following medications were listed on pt's [facility] discharge summary; pt was no on these medications at [facility] and they were not ordered on admission to CIR: Allopurinol Ascorbic acid Meclizine PRN Non-urgent Spoke with Reesa Chew, PA; these meds are on hold at this time Meds are on hold    Name of provider notified for urgent issues identified:  N/A  For non-urgent medication issues to be resolved on team rounds tomorrow morning a [facility] Secure Chat Handoff was sent to:  N/A (spoke with Reesa Chew, PA, re: issue above)  Time spent performing this drug regimen review (minutes):  [place], PharmD, BCPS, [facility] Clinical Pharmacist 12/22/2020 4:06 PM

## 2020-12-22 NOTE — H&P (Signed)
Physical Medicine and Rehabilitation Admission H&P    Chief Complaint  Patient presents with  . Functional deficits due to stroke    HPI: [name]. [name] is an 87 year old LH- female with history of T2DM, CHF, meningioma, DDD- Cervical spine s/p decompression, mesenteric ischemia with dumping syndrome (diarrhea after boluses), esophageal stricture --tube feed dependent who was admitted to [facility] on 12/17/20 with confusion, weakness and difficulty speaking.  She was hypoxic at admission and developed increased WOB requiring BIPAP, IV NTG and diuresis due to pulmonary edema/overaload. CT head done revealing acute/subacute L-MCA infarct.  MRI brain done revealing acute infarct in deep insula and frontoparietal junction on left about 6 cm in diameter with mild swelling and minimal petechial blood.   CTA head/neck showed dsmall aneurysm v/s infundibulum supraclinoid R-ICA, left M2 MCA branch occlusion in acute infarct territory, moderate to severe stenosis of multiple proximal right M2 MCA branches, severe stenosis at R-ACA A2/A3 junction and moderate stenosis B-VA at skull base.   2D echo showed EF 35-40% with LV global kinesis, mild AVR and grade II DD.   Neurology recommended permissive HTN and felt that incidental finding of aneurysm v/s infundibulum presented low change of hemorrhage and no intervention given advanced age.  DAPT added with recommendations of Plavix for 90 days. She was weaned to [facility] and Coreg resumed due to labile BP. Tube feeds changed to [facility] with reports of increase tolerance of formula.  Patient with resultant mild right sided weakness with expressive deficits and balance deficits affecting functional status. CIR recommended due to functional decline.    Pt reports LBM was "just now". No constipation. Says she's peeing "ok", but sometimes has dysuria. When asked what's wrong she said I'm weak and I can't talk.  Was started on ABX for new UTI per her daughter.  Wants to  swallow - little bites per pt, daughter and grandson.    Review of Systems  Constitutional: Negative for chills and fever.  HENT: Positive for hearing loss. Negative for tinnitus.   Eyes: Negative for blurred vision and double vision.  Respiratory: Negative for cough and shortness of breath.   Cardiovascular: Negative for chest pain, palpitations and leg swelling.  Gastrointestinal: Negative for abdominal pain, constipation, heartburn and nausea.  Genitourinary: Positive for frequency. Negative for dysuria.  Musculoskeletal: Negative for back pain, joint pain and myalgias.  Skin: Negative for itching and rash.  Neurological: Positive for dizziness (history of vertigo), sensory change (right face, side feels numb), speech change, focal weakness and headaches.  Psychiatric/Behavioral: Negative for memory loss.  All other systems reviewed and are negative.     Past Medical History:  Diagnosis Date  . Anemia    IRON INFUSIONS  . Aortic valve sclerosis   . Arrhythmia   . Arthritis    osteoarthritis  . Basal cell carcinoma of skin   . Brain tumor ([place])   . Brain tumor ([place])   . Cervical spine disease   . CHF (congestive heart failure) ([place])   . Diabetes mellitus without complication ([place])   . Dysrhythmia    sinus arrhythmia  . Esophageal stricture    severe, led to feeding tube placement in Sept 2019  . Esophageal ulcer without bleeding   . Gastrostomy tube dependent ([place])    DOES NOT EAT OR DRINK   . GERD (gastroesophageal reflux disease)   . History of kidney stones   . Hyperlipemia   . Hypertension   . Kidney  stones   . Leaky heart valve   . Meningioma ([place])   . Occlusive mesenteric ischemia ([place])   . Pulmonary hypertension ([place])   . RLS (restless legs syndrome)   . Vertigo     Past Surgical History:  Procedure Laterality Date  . ABDOMINAL HYSTERECTOMY    . APPENDECTOMY    . ARTHROGRAM KNEE Left   . BACK SURGERY     CERVIVAL NECK FUSION  . CATARACT EXTRACTION  W/PHACO Left 11/11/2018   Procedure: CATARACT EXTRACTION PHACO AND INTRAOCULAR LENS PLACEMENT (IOC) LEFT, DIABETIC;  Surgeon: Birder Robson, MD;  Location: [facility] ORS;  Service: Ophthalmology;  Laterality: Left;  [place]  00:41 CDE 6.41 Fluid pack lot # [id number]  . COLONOSCOPY WITH PROPOFOL N/A 06/20/2017   Procedure: COLONOSCOPY WITH PROPOFOL;  Surgeon: Lollie Sails, MD;  Location: [facility] ENDOSCOPY;  Service: Endoscopy;  Laterality: N/A;  . ESOPHAGOGASTRODUODENOSCOPY (EGD) WITH PROPOFOL N/A 03/14/2015   Procedure: ESOPHAGOGASTRODUODENOSCOPY (EGD) WITH PROPOFOL;  Surgeon: Josefine Class, MD;  Location: [facility] ENDOSCOPY;  Service: Endoscopy;  Laterality: N/A;  . ESOPHAGOGASTRODUODENOSCOPY (EGD) WITH PROPOFOL N/A 03/28/2017   Procedure: ESOPHAGOGASTRODUODENOSCOPY (EGD) WITH PROPOFOL;  Surgeon: Lollie Sails, MD;  Location: [facility] ENDOSCOPY;  Service: Endoscopy;  Laterality: N/A;  . ESOPHAGOGASTRODUODENOSCOPY (EGD) WITH PROPOFOL N/A 06/20/2017   Procedure: ESOPHAGOGASTRODUODENOSCOPY (EGD) WITH PROPOFOL;  Surgeon: Lollie Sails, MD;  Location: [facility] ENDOSCOPY;  Service: Endoscopy;  Laterality: N/A;  . ESOPHAGOGASTRODUODENOSCOPY (EGD) WITH PROPOFOL N/A 10/21/2017   Procedure: ESOPHAGOGASTRODUODENOSCOPY (EGD) WITH PROPOFOL;  Surgeon: Lollie Sails, MD;  Location: [facility] ENDOSCOPY;  Service: Endoscopy;  Laterality: N/A;  . ESOPHAGOGASTRODUODENOSCOPY (EGD) WITH PROPOFOL N/A 04/15/2018   Procedure: ESOPHAGOGASTRODUODENOSCOPY (EGD) WITH PROPOFOL;  Surgeon: Lollie Sails, MD;  Location: [facility] ENDOSCOPY;  Service: Endoscopy;  Laterality: N/A;  . ESOPHAGUS SURGERY     CLOSURE  . EYE SURGERY    . HYSTERECTOMY ABDOMINAL WITH SALPINGECTOMY    . IR GASTROSTOMY TUBE MOD SED  06/11/2018  . IR GASTROSTOMY TUBE REMOVAL  03/25/2019  . IR REPLACE G-TUBE SIMPLE WO FLUORO  10/19/2020  . PERIPHERAL VASCULAR CATHETERIZATION N/A 01/23/2016   Procedure: Visceral Venography;  Surgeon: Algernon Huxley, MD;  Location:  [facility] CV LAB;  Service: Cardiovascular;  Laterality: N/A;  . PERIPHERAL VASCULAR CATHETERIZATION  01/23/2016   Procedure: Peripheral Vascular Intervention;  Surgeon: Algernon Huxley, MD;  Location: [place] CV LAB;  Service: Cardiovascular;;  . UPPER ESOPHAGEAL ENDOSCOPIC ULTRASOUND (EUS) N/A 12/05/2017   Procedure: UPPER ESOPHAGEAL ENDOSCOPIC ULTRASOUND (EUS);  Surgeon: Jola Schmidt, MD;  Location: [facility] ENDOSCOPY;  Service: Endoscopy;  Laterality: N/A;  . VISCERAL ANGIOGRAPHY N/A 03/18/2017   Procedure: Visceral Angiography;  Surgeon: Algernon Huxley, MD;  Location: [facility] CV LAB;  Service: Cardiovascular;  Laterality: N/A;  . VISCERAL ARTERY INTERVENTION N/A 03/18/2017   Procedure: Visceral Artery Intervention;  Surgeon: Algernon Huxley, MD;  Location: [place] CV LAB;  Service: Cardiovascular;  Laterality: N/A;    Family History  Problem Relation Age of Onset  . Stroke Mother   . Hypertension Mother   . Heart disease Mother   . Cancer Father   . Heart disease Father   . Heart attack Sister   . Diabetes Sister   . Heart attack Brother     Social History:  Lives alone and independent PTA. Used to work in a factory.  She  reports that she has never smoked. She has never used smokeless tobacco. She reports that she does not  drink alcohol and does not use drugs.    Allergies  Allergen Reactions  . Elemental Sulfur Diarrhea and Nausea And Vomiting  . Gabapentin Swelling  . Lipitor [Atorvastatin] Other (See Comments)    Muscle aches  . Mevacor [Lovastatin] Other (See Comments)    Muscle aches  . Milk-Related Compounds Other (See Comments)    Large quantities cause headaches   . Septra [Sulfamethoxazole-Trimethoprim] Other (See Comments)    Unknown  . Statins Other (See Comments)    Muscle pain  . Zocor [Simvastatin] Other (See Comments)    Muscle aches   Medications Prior to Admission  Medication Sig Dispense Refill  . acetaminophen (TYLENOL) 160 MG/5ML solution  Place 20.3 mLs (650 mg total) into feeding tube every 6 (six) hours as needed for mild pain. 120 mL 0  . allopurinol (ZYLOPRIM) 100 MG tablet Place 100 mg into feeding tube daily.    . Amino Acids-Protein Hydrolys (FEEDING SUPPLEMENT, PRO-STAT SUGAR FREE 64,) LIQD Place 30 mLs into feeding tube daily. 450 mL 1  . aspirin 81 MG chewable tablet Place 1 tablet (81 mg total) into feeding tube daily. 30 tablet 0  . carvedilol (COREG) 3.125 MG tablet Place 1 tablet (3.125 mg total) into feeding tube 2 (two) times daily with a meal. 30 tablet 3  . cefTRIAXone 1 g in sodium chloride 0.9 % 100 mL Inject 1 g into the vein daily. 1 ampule 0  . clopidogrel (PLAVIX) 75 MG tablet GIVE 1 TABLET VIA FEEDING TUBE EVERY DAY AS DIRECTED (Patient taking differently: Take 75 mg by mouth daily.) 30 tablet 6  . dextromethorphan-guaiFENesin (MUCINEX DM) 30-600 MG 12hr tablet Take 1 tablet by mouth 2 (two) times daily as needed for cough.    . docusate (COLACE) 50 MG/5ML liquid Place 5 mLs (50 mg total) into feeding tube 2 (two) times daily. 100 mL 0  . furosemide (LASIX) 20 MG tablet Place 0.5 tablets (10 mg total) into feeding tube daily. 30 tablet 3  . Hypromellose 0.2 % SOLN Place 1 drop into both eyes 3 (three) times daily as needed (dry eyes).     . meclizine (ANTIVERT) 25 MG tablet Place 25 mg into feeding tube as needed for dizziness.     . Multiple Vitamin (MULTIVITAMIN) LIQD Place 15 mLs into feeding tube daily. 450 mL 1  . Nutritional Supplements (FEEDING SUPPLEMENT, KATE FARMS STANDARD 1.4,) LIQD liquid Place 325 mLs into feeding tube 3 (three) times daily. 975 mL 10  . Nutritional Supplements (FEEDING SUPPLEMENT, PROSOURCE TF,) liquid Place 45 mLs into feeding tube daily. 200 mL 0  . polyethylene glycol (MIRALAX / GLYCOLAX) 17 g packet Place 17 g into feeding tube daily. 14 each 0  . rOPINIRole (REQUIP) 2 MG tablet Place 1 tablet (2 mg total) into feeding tube 2 (two) times daily. 60 tablet 1  . vitamin C  (VITAMIN C) 250 MG tablet Place 1 tablet (250 mg total) into feeding tube 2 (two) times daily. 60 tablet 1  . Water For Irrigation, Sterile (FREE WATER) SOLN Place 30 mLs into feeding tube every 4 (four) hours.      Drug Regimen Review  Drug regimen was reviewed and remains appropriate with no significant issues identified  Home:     Functional History:    Functional Status:  Mobility:          ADL:    Cognition: Cognition Orientation Level: Oriented to person,Oriented to place,Oriented to situation,Disoriented to time     Blood  pressure (!) 153/89, pulse 74, temperature 98.2 F (36.8 C), temperature source Oral, resp. rate 16, SpO2 99 %. Physical Exam Vitals and nursing note reviewed. Exam conducted with a chaperone present.  Constitutional:      Appearance: Normal appearance.     Comments: Thin elderly female. Tends to mouth breathe.  Laying supine in bed- daughter and grandson(?) at bedside, pt frail appearing, NAD  HENT:     Head: Normocephalic.     Comments: Decreased sensation -"feels dead" on R side of face; tongue midline; opens mouth into large "O"- cannot smile, no matter how many times asked Has large mole? On R naso-labial fold    Right Ear: External ear normal.     Left Ear: External ear normal.     Nose: Nose normal. No congestion.     Mouth/Throat:     Mouth: Mucous membranes are dry.     Pharynx: Oropharynx is clear. No oropharyngeal exudate.  Eyes:     General:        Right eye: No discharge.        Left eye: No discharge.     Extraocular Movements: Extraocular movements intact.     Comments: No nystagmus  Cardiovascular:     Heart sounds: Normal heart sounds.     Comments: Sounds RRR- no JVD Pulmonary:     Comments: CTA B/L- no W/R/R- good air movement However did have a tiny cough x1, sounded very clear Abdominal:     Palpations: There is no mass.     Tenderness: There is no abdominal tenderness.     Comments: Soft, NT, ND, (+)BS  Has  PEG in LUQ- looks good- chronic  Genitourinary:    General: Normal vulva.  Musculoskeletal:     Cervical back: Normal range of motion and neck supple.     Comments: LUE and LLE 5/5 - stronger than expected- deltoid, biceps, triceps, WE, grip and finger abd as well as HF, KE, KF DF and PF RUE- deltoid 4/5, biceps 4/5, triceps 3+/5, WE 3+/5, grip 2- to 2/5, and finger abd 2-/5 RLE- HF 3/5, KE 3+/5, KF 3+/5, DF 4-/5 and PF 4/5 Does have intrinsic hand atrophy B/L  Skin:    Comments: R upper arm IV- looks OK ecchymoses on B/L UEs from blood draws and bruising- purple Sacrum- is likely pressure- very thin/cahcetic, however sacrum is red, but blanchable!- also red between buttock cheeks- no rash, although itching in labial area  Neurological:     Mental Status: She is alert.     Comments: Moderate to severe dysarthria but able to answer basic questions. Mild expressive deficits. She was able to answer orientation questions and follow simple motor commands. Right sided weakness with sensory deficits. .  Also has apraxia- when attempted to do some tasks, was unable to, but was trying, like trying to smile; of note has most of her teeth.  Psychiatric:     Comments: Bright, appropriate- tries to interact     Results for orders placed or performed during the hospital encounter of 12/22/20 (from the past 48 hour(s))  Glucose, capillary     Status: Abnormal   Collection Time: 12/22/20  1:40 PM  Result Value Ref Range   Glucose-Capillary 237 (H) 70 - 99 mg/dL    Comment: Glucose reference range applies only to samples taken after fasting for at least 8 hours.  CBC     Status: Abnormal   Collection Time: 12/22/20  3:02 PM  Result Value  Ref Range   WBC 7.5 4.0 - 10.5 K/uL   RBC 3.33 (L) 3.87 - 5.11 MIL/uL   Hemoglobin 9.7 (L) 12.0 - 15.0 g/dL   HCT 31.0 (L) 36.0 - 46.0 %   MCV 93.1 80.0 - 100.0 fL   MCH 29.1 26.0 - 34.0 pg   MCHC 31.3 30.0 - 36.0 g/dL   RDW 14.5 11.5 - 15.5 %   Platelets 269  150 - 400 K/uL   nRBC 0.0 0.0 - 0.2 %    Comment: Performed at [facility], [place] [address] [place], [place] [address]  Comprehensive metabolic panel     Status: Abnormal   Collection Time: 12/22/20  3:02 PM  Result Value Ref Range   Sodium 137 135 - 145 mmol/L   Potassium 4.6 3.5 - 5.1 mmol/L   Chloride 103 98 - 111 mmol/L   CO2 27 22 - 32 mmol/L   Glucose, Bld 277 (H) 70 - 99 mg/dL    Comment: Glucose reference range applies only to samples taken after fasting for at least 8 hours.   BUN 47 (H) 8 - 23 mg/dL   Creatinine, Ser 1.05 (H) 0.44 - 1.00 mg/dL   Calcium 8.1 (L) 8.9 - 10.3 mg/dL   Total Protein 5.6 (L) 6.5 - 8.1 g/dL   Albumin 2.8 (L) 3.5 - 5.0 g/dL   AST 21 15 - 41 U/L   ALT 16 0 - 44 U/L   Alkaline Phosphatase 51 38 - 126 U/L   Total Bilirubin 0.4 0.3 - 1.2 mg/dL   GFR, Estimated 51 (L) >60 mL/min    Comment: (NOTE) Calculated using the CKD-EPI Creatinine Equation (2021)    Anion gap 7 5 - 15    Comment: Performed at [facility], [place] [address] [place], [place] [address]  Glucose, capillary     Status: Abnormal   Collection Time: 12/22/20  5:00 PM  Result Value Ref Range   Glucose-Capillary 191 (H) 70 - 99 mg/dL    Comment: Glucose reference range applies only to samples taken after fasting for at least 8 hours.   No results found.     Medical Problem List and Plan: 1.  R hemiparesis as well as expressive aphasia secondary to L MCA stroke  -patient may  shower  -ELOS/Goals:8-10 days- mod I to supervision 2.  Antithrombotics: -DVT/anticoagulation:  Mechanical: Sequential compression devices, below knee Bilateral lower extremities--continue to hold lovenox for now.   -antiplatelet therapy: ASA/Plavix 3. Headaches/Pain Management: Tylenol prn- denies pain currently 4. Mood: LCSW to follow for evaluation and support.   -antipsychotic agents: N/A 5. Neuropsych: This patient is capable of making decisions on her own behalf. 6. Skin/Wound Care:  Routine pressure relief measures.  7. Fluids/Electrolytes/Nutrition: Has PEG  Tube feeds qid with water flushes.  --tolerating "[employer]"--asked family to check into vegan supplemental feeds.  8. Acute on chronic systolic CHF: Compensated. Will monitor weight daily and for signs of overload  --On Coreg bid, Lasix and ASA--no statin due to myalgias.   --Continue to hold Lisinopril and Spironolactone.  9. Pre-renal azotemia: BUN around 50 at baseline. Continue water flushes.  --recheck today. May need to stay dry to avoid flash edema.   10. T2DM: Hgb A1c-6.9. Monitor BS ac/hs prior to tube feeds.   ---Continue SSI for elevated BS 11. HTN: Monitor BP tid--continue Coreg and Lasix.  12. Esophageal stricture/mesenteric ischemia: On tube feeds qid--tolerating current formula without diarrhea. Now with constipation.   --  G tube was replaced on 10/2020-  13. UTI: On Rocephin D# 2. Cultures pending.  14. Constipation: Continue Senna S with miralax.  --large black stools documented this am.   --recheck CBC as abdominal pain reported also.  15. Dysphagia?  -pt specifically wants to have bites, but explained will need SLP evaluation first- she wasn't really supposed to do so from Esophageal stricture, but did have sips and bites- maybe 1/2 cookie/cracker in a few hours.      Bary Leriche- PA-C 12/22/2020   I have personally performed a face to face diagnostic evaluation of this patient and formulated the key components of the plan.  Additionally, I have personally reviewed laboratory data, imaging studies, as well as relevant notes and concur with the physician assistant's documentation above.   The patient's status has not changed from the original H&P.  Any changes in documentation from the acute care chart have been noted above.     Courtney Heys, MD 12/22/2020

## 2020-12-22 NOTE — Care Management Important Message (Signed)
Important Message  Patient Details  Name: [name] Day MRN: [id number] Date of Birth: [birth year]   Medicare Important Message Given:  Yes     Juliann Pulse A [facility] 12/22/2020, 10:09 AM

## 2020-12-22 NOTE — PMR Pre-admission (Signed)
PMR Admission Coordinator Pre-Admission Assessment  Patient: [name] is an 87 y.o., female MRN: [id number] DOB: November 07, 1928 Height: 5\' 7"  (170.2 cm) Weight: 53.2 kg              Insurance Information HMO:     PPO: yes     PCP:      IPA:      80/20:      OTHER:  PRIMARY: BCBS Medicare      Policy#: [id number]      Subscriber: pt CM Name: [name]      Phone#: [phone]     Fax#: [phone] Pre-Cert#: tbd on admit      Employer:  Benefits:  Phone #: [phone]     Name:  [name]: 10/01/20     Deduct: $0      Out of Pocket Max: $5900 ($206.51)      Life Max: n/a  CIR: $335/admission      SNF: $188/[name] Outpatient:      Co-Pay: $40 Home Health: 100%      Co-Pay:  DME: 80%     Co-Ins: 20% Providers:  SECONDARY:       Policy#:       Phone#:   Development worker, community:       Phone#:   The "Data Collection Information Summary" for patients in Inpatient Rehabilitation Facilities with attached "Privacy Act [place] Records" was provided and verbally reviewed with: Patient and Family  Emergency Contact Information Contact Information    Name Relation Home Work Mobile   [name],[name] Daughter   [phone]   [name],[name] Daughter [phone]  [phone]   [name], [name]   [phone]   [name] Other   [phone]     Current Medical History  Patient Admitting Diagnosis: L MCA   History of Present Illness: [name] is a 87 y.o. female who presented to [facility] on 12/17/20 with complaints of malaise, confusion, and weakness and was found to have a left MCA stroke, acute hypoxic respiratory failure secondary to flash pulmonary edema (required bipap briefly), and hypertension. PMH includes anemia, aortic valve sclerosis, esophageal stricture s/p PEG, CHF, and brain tumor.  Neurology was consulted for CVA and recommended 81 mg aspirin daily, and plavix for 90 days.  Initially required bipap for respiratory failure, O2 sat on admission was 68%, resolved  with IV lasix.  Therapy evaluations were completed and pt was recommended for CIR.   Complete NIHSS TOTAL: 4 Glasgow Coma Scale Score: 15  Past Medical History  Past Medical History:  Diagnosis [name]  . Anemia    IRON INFUSIONS  . Aortic valve sclerosis   . Arrhythmia   . Arthritis    osteoarthritis  . Basal cell carcinoma of skin   . Brain tumor ([place])   . Brain tumor ([place])   . Cervical spine disease   . CHF (congestive heart failure) ([place])   . Diabetes mellitus without complication ([place])   . Dysrhythmia    sinus arrhythmia  . Esophageal stricture    severe, led to feeding tube placement in Sept 2019  . Esophageal ulcer without bleeding   . Gastrostomy tube dependent ([place])    DOES NOT EAT OR DRINK   . GERD (gastroesophageal reflux disease)   . History of kidney stones   . Hyperlipemia   . Hypertension   . Kidney stones   . Leaky heart valve   . Meningioma ([place])   . 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  Pulmonary hypertension ([place])   . RLS (restless legs syndrome)   . Vertigo     Family History  family history includes Cancer in her father; Diabetes in her sister; Heart attack in her brother and sister; Heart disease in her father and mother; Hypertension in her mother; Stroke in her mother.  Prior Rehab/Hospitalizations:  Has the patient had prior rehab or hospitalizations prior to admission? No  Has the patient had major surgery during 100 days prior to admission? No  Current Medications   Current Facility-Administered Medications:  .  0.9 %  sodium chloride infusion, 250 mL, Intravenous, Continuous, Aleskerov, Fuad, MD, Last Rate: 10 mL/hr at 12/22/20 0525, Infusion Verify at 12/22/20 0525 .  albuterol (PROVENTIL) (2.5 MG/3ML) 0.083% nebulizer solution 2.5 mg, 2.5 mg, Nebulization, Q6H PRN, Rust-Chester, Britton L, NP .  aspirin chewable tablet 81 mg, 81 mg, Per Tube, Daily, Rust-Chester, Britton L, NP, 81 mg at 12/21/20 0913 .  carvedilol (COREG) tablet 3.125  mg, 3.125 mg, Per Tube, BID WC, Wieting, Richard, MD, 3.125 mg at 12/21/20 1627 .  cefTRIAXone (ROCEPHIN) 1 g in sodium chloride 0.9 % 100 mL IVPB, 1 g, Intravenous, Q24H, Regalado, Belkys A, MD, Stopped at 12/21/20 1925 .  Chlorhexidine Gluconate Cloth 2 % PADS 6 each, 6 each, Topical, Daily, Rust-Chester, Britton L, NP, 6 each at 12/21/20 1000 .  clopidogrel (PLAVIX) tablet 75 mg, 75 mg, Per Tube, Daily, Bhagat, Srishti L, MD, 75 mg at 12/21/20 0913 .  docusate (COLACE) 50 MG/5ML liquid 100 mg, 100 mg, Per Tube, BID PRN, Regalado, Belkys A, MD .  sennosides (SENOKOT) 8.8 MG/5ML syrup 5 mL, 5 mL, Per Tube, BID **AND** docusate (COLACE) 50 MG/5ML liquid 50 mg, 50 mg, Per Tube, BID, Regalado, Belkys A, MD .  feeding supplement (KATE FARMS STANDARD 1.4) liquid 325 mL, 325 mL, Per Tube, TID, Regalado, Belkys A, MD, 325 mL at 12/21/20 2048 .  feeding supplement (PROSource TF) liquid 45 mL, 45 mL, Per Tube, Daily, Loletha Grayer, MD, 45 mL at 12/21/20 0914 .  free water 100 mL, 100 mL, Per Tube, Q6H, Wieting, Richard, MD, 100 mL at 12/22/20 0507 .  free water 50 mL, 50 mL, Per Tube, TID, Leslye Peer, Richard, MD, 50 mL at 12/21/20 2048 .  furosemide (LASIX) tablet 10 mg, 10 mg, Per Tube, Daily, Loletha Grayer, MD, 10 mg at 12/21/20 0912 .  HYDROcodone-acetaminophen (HYCET) 7.5-325 mg/15 ml solution 5 mL, 5 mL, Per Tube, Q6H PRN, Wieting, Richard, MD .  insulin aspart (novoLOG) injection 0-9 Units, 0-9 Units, Subcutaneous, Q4H, Loletha Grayer, MD, 3 Units at 12/22/20 0018 .  morphine 2 MG/ML injection 1 mg, 1 mg, Intravenous, Q4H PRN, Loletha Grayer, MD, 1 mg at 12/20/20 1041 .  ondansetron (ZOFRAN) injection 4 mg, 4 mg, Intravenous, Q6H PRN, Loletha Grayer, MD, 4 mg at 12/20/20 1004 .  pantoprazole (PROTONIX) injection 40 mg, 40 mg, Intravenous, Q24H, Rust-Chester, Britton L, NP, 40 mg at 12/22/20 0017 .  polyethylene glycol (MIRALAX / GLYCOLAX) packet 17 g, 17 g, Per Tube, Daily, Loletha Grayer,  MD, 17 g at 12/21/20 0912 .  polyvinyl alcohol (LIQUIFILM TEARS) 1.4 % ophthalmic solution 1 drop, 1 drop, Both Eyes, PRN, Regalado, Belkys A, MD .  rOPINIRole (REQUIP) tablet 2 mg, 2 mg, Per J Tube, BID, Loletha Grayer, MD, 2 mg at 12/21/20 2047  Patients Current Diet:  Diet Order            Diet NPO time specified  Diet effective now  Precautions / Restrictions Precautions Precautions: Fall Precaution Comments: HOB > 30 deg Restrictions Weight Bearing Restrictions: No Other Position/Activity Restrictions: G-tube to LLQ   Has the patient had 2 or more falls or a fall with injury in the past year?No  Prior Activity Level Limited Community (1-2x/wk): was driving short distances, using rollator or cane for mobility, cooking for her family, managing meds, and ADLs, iADLs, her daughter does her grocery shopping for her but she makes the list  Prior Functional Level Prior Function Level of Independence: Independent with assistive device(s) Comments: Mod Ind amb with a rollator in the home and a QC in the community, Ind with ADL including Gtube feedings, takes sponge baths, no recent fall history; two daughters and other family are able to cover 24 hr supervision if needed  Self Care: Did the patient need help bathing, dressing, using the toilet or eating?  Independent  Indoor Mobility: Did the patient need assistance with walking from room to room (with or without device)? Independent  Stairs: Did the patient need assistance with internal or external stairs (with or without device)? Independent  Functional Cognition: Did the patient need help planning regular tasks such as shopping or remembering to take medications? Independent  Home Assistive Devices / Equipment Home Equipment: Walker - 2 wheels,Walker - 4 wheels,Cane - quad,Bedside commode  Prior Device Use: Indicate devices/aids used by the patient prior to current illness, exacerbation or injury? Walker  and cane  Current Functional Level Cognition  Arousal/Alertness: Awake/alert Overall Cognitive Status: Difficult to assess Difficult to assess due to: Impaired communication Orientation Level: Oriented to person,Oriented to place,Oriented to time,Oriented to situation (via yes/no questions) General Comments: cues for visual attention to R side, cues for exaggerated movements and going slowly to improve quality of movement Attention: Selective Selective Attention: Appears intact Memory: Appears intact    Extremity Assessment (includes Sensation/Coordination)  Upper Extremity Assessment: RUE deficits/detail,LUE deficits/detail RUE Deficits / Details: very mild pronator drift noted, impaired FMC/GMC, impaired sensation, mild visual inattention to RUE during tasks, shoulder flexion grossly 4-/5, elbow flexion 4/5, elbow extension 4-/5, grip 3+/5, arthritic joints RUE Sensation: decreased light touch,decreased proprioception RUE Coordination: decreased fine motor,decreased gross motor LUE Deficits / Details: strength WFL, very mild coordination deficits but functionally not limiting, arthritic joints  Lower Extremity Assessment: Defer to PT evaluation,Generalized weakness,RLE deficits/detail,LLE deficits/detail RLE Deficits / Details: RLE strength grossly 4/5 and equal L/R RLE Sensation: decreased light touch RLE Coordination: decreased gross motor LLE Deficits / Details: LLE strength grossly 4/5 and equal L/R LLE Coordination: WNL    ADLs  Overall ADL's : Needs assistance/impaired Eating/Feeding: Sitting,Minimal assistance,Cueing for sequencing Eating/Feeding Details (indicate cue type and reason): Pt allowed to take ice chips. Seated EOB, pt able to grossly grasp small cup with R hand w/ cues for composite finger extension, visual attention to improve performance; pt able to sustain grasp Grooming: Sitting,Wash/dry hands,Wash/dry face,Set up,Supervision/safety,Cueing for  sequencing Grooming Details (indicate cue type and reason): Seated EOB, pt grasped tissues, able to grossly wring out a washcloth and wash face with min-mod coordination deficits, improving with visual attention and cues Upper Body Bathing: Sitting,Minimal assistance Lower Body Bathing: Sit to/from stand,Minimal assistance Upper Body Dressing : Sitting,Minimal assistance Lower Body Dressing: Sit to/from stand,Minimal assistance Toilet Transfer: Minimal assistance,BSC,RW,Ambulation General ADL Comments: grossly MIN A for grooming, UB and LB ADL tasks, intermittent cues for RUE attention/safety    Mobility  Overal bed mobility: Modified Independent General bed mobility comments: Extra time and effort only  Transfers  Overall transfer level: Needs assistance Equipment used: Rolling walker (2 wheeled) Transfers: Sit to/from Stand Sit to Stand: Min guard General transfer comment: Cues for hand placement    Ambulation / Gait / Stairs / Wheelchair Mobility  Ambulation/Gait Ambulation/Gait assistance: Counsellor (Feet): 100 Feet Assistive device: Rolling walker (2 wheeled) Gait Pattern/deviations: Step-through pattern,Decreased step length - right,Shuffle General Gait Details: Mod verbal cues to attend to R hand to ensure proper placement on the RW and for increased RLE step length and amb closer to the RW with upright posture Gait velocity: decreased    Posture / Balance Dynamic Sitting Balance Sitting balance - Comments: pt challenged in static and dynamic sitting balance, no LOB, PRN cues for UE support Balance Overall balance assessment: Needs assistance Sitting-balance support: Feet supported,No upper extremity supported,Single extremity supported Sitting balance-Leahy Scale: Good Sitting balance - Comments: pt challenged in static and dynamic sitting balance, no LOB, PRN cues for UE support Postural control: Posterior lean Standing balance support: Bilateral upper  extremity supported,During functional activity Standing balance-Leahy Scale: Fair Standing balance comment: Min lean on the RW for support in standing but no LOB this session    Special needs/care consideration Skin peg tube (chronic) and Diabetic management yes     Previous Home Environment (from acute therapy documentation) Living Arrangements: Alone  Lives With: Alone Available Help at Discharge: Family,Available 24 hours/[name] Type of Home: House Home Layout: One level Home Access: Ramped entrance Bathroom Shower/Tub: Chiropodist: Handicapped height Additional Comments: Dtr in room provided history secondary to pt with expressive difficulty  Discharge Living Setting Plans for Discharge Living Setting: Patient's home Type of Home at Discharge: House Discharge Home Layout: One level Discharge Home Access: [place] entrance Discharge Bathroom Shower/Tub: [place] unit Discharge Bathroom Toilet: Standard Discharge Bathroom Accessibility: Yes How Accessible: Accessible via walker Does the patient have any problems obtaining your medications?: No  Social/Family/Support Systems Anticipated Caregiver: daughters, [name] and [place] Anticipated Caregiver's Contact Information: [name] [name] [phone] Ability/Limitations of Caregiver: n/a Caregiver Availability: 24/7 Discharge Plan Discussed with Primary Caregiver: Yes Is Caregiver In Agreement with Plan?: Yes Does Caregiver/Family have Issues with Lodging/Transportation while Pt is in Rehab?: No   Goals Patient/Family Goal for Rehab: PT/OT supervision to mod I, SLP supervision to min assist Expected length of stay: 7-10 days Additional Information: pt with a PEG prior to admit 2/2 esophogeal stricture Pt/Family Agrees to Admission and willing to participate: Yes Program Orientation Provided & Reviewed with Pt/Caregiver Including Roles  & Responsibilities: Yes  Barriers to Discharge:  Insurance for SNF coverage   Decrease burden of Care through IP rehab admission: n/a  Possible need for SNF placement upon discharge: No   Patient Condition: This patient's medical and functional status has changed since the consult dated: 3/22 in which the Rehabilitation Physician determined and documented that the patient's condition is appropriate for intensive rehabilitative care in an inpatient rehabilitation facility. See "History of Present Illness" (above) for medical update. Functional changes are: pt min assist for mobility. Patient's medical and functional status update has been discussed with the Rehabilitation physician and patient remains appropriate for inpatient rehabilitation. Will admit to inpatient rehab today.  Preadmission Screen Completed By:  Michel Santee, PT, DPT 12/22/2020 10:02 AM ______________________________________________________________________   Discussed status with Dr. Dagoberto Ligas on 12/22/20 at 10:13 AM  and received approval for admission today.  Admission Coordinator:  Michel Santee, PT, DPT time 10:14 AM Sudie Grumbling 12/22/20

## 2020-12-22 NOTE — Progress Notes (Signed)
Patient ID: [name] Day, female   DOB: 1929/09/21, 87 y.o.   MRN: [id number] Patient admitted to unit via carelink. Oriented to unit, reviewed orders, plan of care and therapy schedule. Patient with PEG and bruising to bil UE. Reports R UE is numb and R LE with decreased sensation and speech is slurred/dysarthric with hx of esophogeal issues and is NPO. Independent PTA and administering TF herself with report of ocassional liquids via mouth and ice chips. Pt asked for admission questions to be deferred to her daughter who is in route.[name]

## 2020-12-22 NOTE — Progress Notes (Signed)
Physical Therapy Treatment Patient Details Name: [name] Day MRN: [id number] DOB: 1929/09/01 Today's Date: 12/22/2020    History of Present Illness Pt is an 87 yo F presenting to the ED from home with her daughter with complaints of malaise, confusion & weakness.  MD assessment includes: acute left MCA stroke, acute hypoxic respiratory failure secondary to flash pulmonary edema, and HTN. PMH includes anemia, aortic valve sclerosis, arrhythmia, arthritis, basal cell carcinoma of skin, brain tumor, brain tumor, cervical spine disease, CHF, DM, dysrhythmia, esophageal stricture, esophageal ulcer without bleeding, gastrostomy tube dependent, GERD, history of kidney stones, hyperlipemia, hypertension, kidney stones, leaky heart valve, meningioma, occlusive mesenteric ischemia, pulmonary hypertension, RLS, and vertigo.    PT Comments    Pt was pleasant and motivated to participate during the session and continued to make good progress towards goals.  Pt presented with grossly improved ability to formulate words this session and presented with improved stability during seated and standing activities.  Pt was able to amb 125' with a RW with improved safety including amb closer to the RW and staying inside the RW during 180 deg turns.  Pt required minimal cuing to ensure that her R hand remained on the walker handle but again this was improved compared to prior sessions.  Pt will benefit from PT services in an IR setting upon discharge to safely address deficits listed in patient problem list for decreased caregiver assistance and eventual return to PLOF.     Follow Up Recommendations  CIR     Equipment Recommendations  None recommended by PT    Recommendations for Other Services       Precautions / Restrictions Precautions Precautions: Fall Precaution Comments: HOB > 30 deg Restrictions Other Position/Activity Restrictions: G-tube to LLQ    Mobility  Bed Mobility Overal bed mobility:  Modified Independent             General bed mobility comments: Extra time and effort only    Transfers Overall transfer level: Needs assistance Equipment used: Rolling walker (2 wheeled) Transfers: Sit to/from Stand Sit to Stand: Min guard         General transfer comment: Cues for hand placement  Ambulation/Gait Ambulation/Gait assistance: Min guard Gait Distance (Feet): 125 Feet Assistive device: Rolling walker (2 wheeled) Gait Pattern/deviations: Step-through pattern;Decreased step length - right;Decreased step length - left Gait velocity: decreased   General Gait Details: Min verbal cues to attend to R hand to ensure proper placement on the RW; improved RLE step length and clearance this session   Stairs             Wheelchair Mobility    Modified Rankin (Stroke Patients Only)       Balance Overall balance assessment: Needs assistance Sitting-balance support: Feet supported;No upper extremity supported;Single extremity supported Sitting balance-Leahy Scale: Good Sitting balance - Comments: Perturbations given in random planes with pt able to maintain upright sitting position throughout   Standing balance support: Bilateral upper extremity supported;During functional activity;No upper extremity supported Standing balance-Leahy Scale: Fair Standing balance comment: Fair static standing with posterior LOB when challenged with head turns without UE support             High level balance activites: Head turns              Cognition Arousal/Alertness: Awake/alert Behavior During Therapy: WFL for tasks assessed/performed Overall Cognitive Status: Difficult to assess  Exercises Total Joint Exercises Hip ABduction/ADduction: Strengthening;Both;10 reps Straight Leg Raises: Strengthening;Both;10 reps Long Arc Quad: Strengthening;Both;10 reps Knee Flexion: Strengthening;Both;10  reps Marching in Standing: AROM;Strengthening;Both;5 reps;Standing Other Exercises Other Exercises: Standing unsupported balance training with feet apart and combinations of eyes open/closed and head still/head turns with occasional posterior LOB Other Exercises: Seated RUE gross motor coordination training with reaching for targets    General Comments        Pertinent Vitals/Pain Pain Assessment: No/denies pain    Home Living                      Prior Function            PT Goals (current goals can now be found in the care plan section) Progress towards PT goals: Progressing toward goals    Frequency    7X/week      PT Plan Current plan remains appropriate    Co-evaluation              AM-PAC PT "6 Clicks" Mobility   Outcome Measure  Help needed turning from your back to your side while in a flat bed without using bedrails?: A Little Help needed moving from lying on your back to sitting on the side of a flat bed without using bedrails?: A Little Help needed moving to and from a bed to a chair (including a wheelchair)?: A Little Help needed standing up from a chair using your arms (e.g., wheelchair or bedside chair)?: A Little Help needed to walk in hospital room?: A Little Help needed climbing 3-5 steps with a railing? : A Little 6 Click Score: 18    End of Session Equipment Utilized During Treatment: Gait belt Activity Tolerance: Patient tolerated treatment well Patient left: in bed;with call bell/phone within reach;with bed alarm set;with family/visitor present Nurse Communication: Mobility status PT Visit Diagnosis: Unsteadiness on feet (R26.81);Hemiplegia and hemiparesis;Muscle weakness (generalized) (M62.81);Difficulty in walking, not elsewhere classified (R26.2) Hemiplegia - Right/Left: Right Hemiplegia - dominant/non-dominant: Dominant Hemiplegia - caused by: Cerebral infarction     Time: 8832-5498 PT Time Calculation (min) (ACUTE  ONLY): 25 min  Charges:  $Gait Training: 8-22 mins $Therapeutic Exercise: 8-22 mins                     D. Scott Clayson Riling PT, DPT 12/22/20, 1:33 PM

## 2020-12-23 ENCOUNTER — Encounter (INDEPENDENT_AMBULATORY_CARE_PROVIDER_SITE_OTHER): Payer: Medicare Other

## 2020-12-23 ENCOUNTER — Ambulatory Visit (INDEPENDENT_AMBULATORY_CARE_PROVIDER_SITE_OTHER): Payer: Medicare Other | Admitting: Vascular Surgery

## 2020-12-23 DIAGNOSIS — Z931 Gastrostomy status: Secondary | ICD-10-CM

## 2020-12-23 DIAGNOSIS — K222 Esophageal obstruction: Secondary | ICD-10-CM

## 2020-12-23 DIAGNOSIS — E46 Unspecified protein-calorie malnutrition: Secondary | ICD-10-CM

## 2020-12-23 DIAGNOSIS — R531 Weakness: Secondary | ICD-10-CM

## 2020-12-23 DIAGNOSIS — E8809 Other disorders of plasma-protein metabolism, not elsewhere classified: Secondary | ICD-10-CM

## 2020-12-23 DIAGNOSIS — N390 Urinary tract infection, site not specified: Secondary | ICD-10-CM

## 2020-12-23 LAB — CBC WITH DIFFERENTIAL/PLATELET
Abs Immature Granulocytes: 0.03 10*3/uL (ref 0.00–0.07)
Basophils Absolute: 0.1 10*3/uL (ref 0.0–0.1)
Basophils Relative: 1 %
Eosinophils Absolute: 0.5 10*3/uL (ref 0.0–0.5)
Eosinophils Relative: 8 %
HCT: 30 % — ABNORMAL LOW (ref 36.0–46.0)
Hemoglobin: 9.3 g/dL — ABNORMAL LOW (ref 12.0–15.0)
Immature Granulocytes: 1 %
Lymphocytes Relative: 19 %
Lymphs Abs: 1.2 10*3/uL (ref 0.7–4.0)
MCH: 28.7 pg (ref 26.0–34.0)
MCHC: 31 g/dL (ref 30.0–36.0)
MCV: 92.6 fL (ref 80.0–100.0)
Monocytes Absolute: 0.5 10*3/uL (ref 0.1–1.0)
Monocytes Relative: 8 %
Neutro Abs: 4.1 10*3/uL (ref 1.7–7.7)
Neutrophils Relative %: 63 %
Platelets: 272 10*3/uL (ref 150–400)
RBC: 3.24 MIL/uL — ABNORMAL LOW (ref 3.87–5.11)
RDW: 14.3 % (ref 11.5–15.5)
WBC: 6.3 10*3/uL (ref 4.0–10.5)
nRBC: 0 % (ref 0.0–0.2)

## 2020-12-23 LAB — COMPREHENSIVE METABOLIC PANEL
ALT: 14 U/L (ref 0–44)
AST: 17 U/L (ref 15–41)
Albumin: 2.7 g/dL — ABNORMAL LOW (ref 3.5–5.0)
Alkaline Phosphatase: 47 U/L (ref 38–126)
Anion gap: 6 (ref 5–15)
BUN: 41 mg/dL — ABNORMAL HIGH (ref 8–23)
CO2: 27 mmol/L (ref 22–32)
Calcium: 8.3 mg/dL — ABNORMAL LOW (ref 8.9–10.3)
Chloride: 104 mmol/L (ref 98–111)
Creatinine, Ser: 0.96 mg/dL (ref 0.44–1.00)
GFR, Estimated: 57 mL/min — ABNORMAL LOW (ref >60)
Glucose, Bld: 158 mg/dL — ABNORMAL HIGH (ref 70–99)
Potassium: 4.5 mmol/L (ref 3.5–5.1)
Sodium: 137 mmol/L (ref 135–145)
Total Bilirubin: 0.3 mg/dL (ref 0.3–1.2)
Total Protein: 5.3 g/dL — ABNORMAL LOW (ref 6.5–8.1)

## 2020-12-23 LAB — GLUCOSE, CAPILLARY
Glucose-Capillary: 150 mg/dL — ABNORMAL HIGH (ref 70–99)
Glucose-Capillary: 175 mg/dL — ABNORMAL HIGH (ref 70–99)
Glucose-Capillary: 153 mg/dL — ABNORMAL HIGH (ref 70–99)
Glucose-Capillary: 128 mg/dL — ABNORMAL HIGH (ref 70–99)

## 2020-12-23 MED ORDER — ENOXAPARIN SODIUM 40 MG/0.4ML ~~LOC~~ SOLN
40.0000 mg | Freq: Every day | SUBCUTANEOUS | Status: DC
  Administered 2020-12-23 – 2021-01-01 (×10): 40 mg via SUBCUTANEOUS
  Filled 2020-12-23 (×10): qty 0.4

## 2020-12-23 MED ORDER — KATE FARMS STANDARD 1.4 PO LIQD
325.0000 mL | Freq: Three times a day (TID) | ORAL | Status: DC
  Filled 2020-12-23: qty 325

## 2020-12-23 MED ORDER — PROSOURCE TF PO LIQD
45.0000 mL | Freq: Two times a day (BID) | ORAL | Status: DC
  Administered 2020-12-23 – 2020-12-28 (×10): 45 mL
  Filled 2020-12-23 (×10): qty 45

## 2020-12-23 NOTE — Progress Notes (Signed)
Patient ID: ARBOR LEER Day, female   DOB: 1928-10-25, 87 y.o.   MRN: [id number] Met with the patient, son and daughter to review role of the nurse CM and address secondary risk factors and educational needs. Reviewed HTN, HLD (LDL 115 , goal of 70) and allergy to statins , HF (Zone tool) and DM ( A1C 6.9) and DAPT for 3 months per MD then ASA solo. Discussed team conference and collaboration with the SW to facilitate preparation for discharge.  Continue to follow along to discharge to address concerns and questions. [name]

## 2020-12-23 NOTE — Evaluation (Signed)
Occupational Therapy Assessment and Plan  Patient Details  Name: [name] Day MRN: [id number] Date of Birth: 08-04-1929  OT Diagnosis: ataxia and hemiplegia affecting dominant side Rehab Potential: Rehab Potential (ACUTE ONLY): Excellent ELOS: 7 days   Today's Date: 12/23/2020 OT Individual Time: 9528-4132 OT Individual Time Calculation (min): 60 min     [name] Problem: Principal Problem:   Cerebrovascular accident (CVA) due to occlusion of left middle cerebral artery ([place]) Active Problems:   Esophageal stricture   Protein-calorie malnutrition, severe   Gastrostomy tube in place [facility])   Right sided weakness   Stroke (cerebrum) ([place])   Hypoalbuminemia due to protein-calorie malnutrition ([name])   Acute lower UTI   Past Medical History:  Past Medical History:  Diagnosis Date  . Anemia    IRON INFUSIONS  . Aortic valve sclerosis   . Arrhythmia   . Arthritis    osteoarthritis  . Basal cell carcinoma of skin   . Brain tumor ([name])   . Brain tumor ([name])   . Cervical spine disease   . CHF (congestive heart failure) ([name])   . Diabetes mellitus without complication ([name])   . Dysrhythmia    sinus arrhythmia  . Esophageal stricture    severe, led to feeding tube placement in Sept 2019  . Esophageal ulcer without bleeding   . 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 GERD (gastroesophageal reflux disease)   . History of kidney stones   . Hyperlipemia   . Hypertension   . Kidney stones   . Leaky heart valve   . Meningioma ([name])   . Occlusive mesenteric ischemia ([name])   . Pulmonary hypertension ([name])   . RLS (restless legs syndrome)   . Vertigo    Past Surgical History:  Past Surgical History:  Procedure Laterality Date  . ABDOMINAL HYSTERECTOMY    . APPENDECTOMY    . ARTHROGRAM KNEE Left   . BACK SURGERY     CERVIVAL NECK FUSION  . CATARACT EXTRACTION W/PHACO Left 11/11/2018   Procedure: CATARACT EXTRACTION PHACO AND INTRAOCULAR LENS  PLACEMENT (IOC) LEFT, DIABETIC;  Surgeon: Birder Robson, MD;  Location: [facility] ORS;  Service: Ophthalmology;  Laterality: Left;  [place]  00:41 CDE 6.41 Fluid pack lot # [id number]  . COLONOSCOPY WITH PROPOFOL N/A 06/20/2017   Procedure: COLONOSCOPY WITH PROPOFOL;  Surgeon: Lollie Sails, MD;  Location: [facility] ENDOSCOPY;  Service: Endoscopy;  Laterality: N/A;  . ESOPHAGOGASTRODUODENOSCOPY (EGD) WITH PROPOFOL N/A 03/14/2015   Procedure: ESOPHAGOGASTRODUODENOSCOPY (EGD) WITH PROPOFOL;  Surgeon: Josefine Class, MD;  [name];  Service: Endoscopy;  Laterality: N/A;  . ESOPHAGOGASTRODUODENOSCOPY (EGD) WITH PROPOFOL N/A 03/28/2017   Procedure: ESOPHAGOGASTRODUODENOSCOPY (EGD) WITH PROPOFOL;  Surgeon: Lollie Sails, MD;  Location: [facility] ENDOSCOPY;  Service: Endoscopy;  Laterality: N/A;  . ESOPHAGOGASTRODUODENOSCOPY (EGD) WITH PROPOFOL N/A 06/20/2017   Procedure: ESOPHAGOGASTRODUODENOSCOPY (EGD) WITH PROPOFOL;  Surgeon: Lollie Sails, MD;  Location: [facility] ENDOSCOPY;  Service: Endoscopy;  Laterality: N/A;  . ESOPHAGOGASTRODUODENOSCOPY (EGD) WITH PROPOFOL N/A 10/21/2017   Procedure: ESOPHAGOGASTRODUODENOSCOPY (EGD) WITH PROPOFOL;  Surgeon: Lollie Sails, MD;  Location: [facility] ENDOSCOPY;  Service: Endoscopy;  Laterality: N/A;  . ESOPHAGOGASTRODUODENOSCOPY (EGD) WITH PROPOFOL N/A 04/15/2018   Procedure: ESOPHAGOGASTRODUODENOSCOPY (EGD) WITH PROPOFOL;  Surgeon: Lollie Sails, MD;  [name];  Service: Endoscopy;  Laterality: N/A;  . ESOPHAGUS SURGERY     CLOSURE  . EYE SURGERY    . HYSTERECTOMY ABDOMINAL WITH SALPINGECTOMY    .  IR GASTROSTOMY TUBE MOD SED  06/11/2018  . IR GASTROSTOMY TUBE REMOVAL  03/25/2019  . IR REPLACE G-TUBE SIMPLE WO FLUORO  10/19/2020  . PERIPHERAL VASCULAR CATHETERIZATION N/A 01/23/2016   Procedure: Visceral Venography;  Surgeon: Algernon Huxley, MD;  Location: [place] CV LAB;  Service: Cardiovascular;  Laterality: N/A;  . PERIPHERAL  VASCULAR CATHETERIZATION  01/23/2016   Procedure: Peripheral Vascular Intervention;  Surgeon: Algernon Huxley, MD;  Location: [place] CV LAB;  Service: Cardiovascular;;  . UPPER ESOPHAGEAL ENDOSCOPIC ULTRASOUND (EUS) N/A 12/05/2017   Procedure: UPPER ESOPHAGEAL ENDOSCOPIC ULTRASOUND (EUS);  Surgeon: Jola Schmidt, MD;  [name];  Service: Endoscopy;  Laterality: N/A;  . VISCERAL ANGIOGRAPHY N/A 03/18/2017   Procedure: Visceral Angiography;  Surgeon: Algernon Huxley, MD;  Location: [place] CV LAB;  Service: Cardiovascular;  Laterality: N/A;  . VISCERAL ARTERY INTERVENTION N/A 03/18/2017   Procedure: Visceral Artery Intervention;  Surgeon: Algernon Huxley, MD;  Location: [place] CV LAB;  Service: Cardiovascular;  Laterality: N/A;    Assessment & Plan Clinical Impression:  [name]. [name] is an 87 year old LH- female with history of T2DM, CHF, meningioma, DDD- Cervical spine s/p decompression, mesenteric ischemia with dumping syndrome (diarrhea after boluses), esophageal stricture --tube feed dependent who was admitted to [facility] on 12/17/20 with confusion, weakness and difficulty speaking.  She was hypoxic at admission and developed increased WOB requiring BIPAP, IV NTG and diuresis due to pulmonary edema/overaload. CT head done revealing acute/subacute L-MCA infarct.  MRI brain done revealing acute infarct in deep insula and frontoparietal junction on left about 6 cm in diameter with mild swelling and minimal petechial blood.   CTA head/neck showed dsmall aneurysm v/s infundibulum supraclinoid R-ICA, left M2 MCA branch occlusion in acute infarct territory, moderate to severe stenosis of multiple proximal right M2 MCA branches, severe stenosis at R-ACA A2/A3 junction and moderate stenosis B-VA at skull base.   2D echo showed EF 35-40% with LV global kinesis, mild AVR and grade II DD.    Neurology recommended permissive HTN and felt that incidental finding of aneurysm v/s infundibulum  presented low change of hemorrhage and no intervention given advanced age.  DAPT added with recommendations of Plavix for 90 days. She was weaned to [name] and Coreg resumed due to labile BP. Tube feeds changed to [name] with reports of increase tolerance of formula.  Patient with resultant mild right sided weakness with expressive deficits and balance deficits affecting functional status. CIR recommended due to functional decline.   Patient transferred to CIR on 12/22/2020 .    Patient currently requires min with basic self-care skills secondary to muscle weakness, unbalanced muscle activation and decreased coordination, decreased visual motor skills and decreased standing balance, decreased postural control, hemiplegia and decreased balance strategies.  Prior to hospitalization, patient could complete all ADLS with independent .  Patient will benefit from skilled intervention to increase independence with basic self-care skills prior to discharge home with care partner.  Anticipate patient will require 24 hour supervision and follow up home health.  OT - End of Session Activity Tolerance: Tolerates 30+ min activity without fatigue Endurance Deficit: No OT Assessment Rehab Potential (ACUTE ONLY): Excellent OT Patient demonstrates impairments in the following area(s): Balance;Motor;Other (Comment);Sensory (expressive communication) OT Basic ADL's Functional Problem(s): Grooming;Bathing;Dressing;Toileting OT Transfers Functional Problem(s): Toilet;Tub/Shower OT Additional Impairment(s): Fuctional Use of Upper Extremity OT Plan OT Intensity: Minimum of 1-2 x/day, 45 to 90 minutes OT Frequency: 5 out of 7 days  OT Duration/Estimated Length of Stay: 7 days OT Treatment/Interventions: Balance/vestibular training;Discharge planning;Neuromuscular re-education;Functional mobility training;DME/adaptive equipment instruction;Patient/family education;Psychosocial support;Self Care/advanced ADL  retraining;Visual/perceptual remediation/compensation;UE/LE Coordination activities;UE/LE Strength taining/ROM;Therapeutic Exercise;Therapeutic Activities OT Self Feeding Anticipated Outcome(s): n/a OT Basic Self-Care Anticipated Outcome(s): supervision OT Toileting Anticipated Outcome(s): supervision OT Bathroom Transfers Anticipated Outcome(s): supervision OT Recommendation Patient destination: Home Follow Up Recommendations: Home health OT Equipment Recommended: Tub/shower bench   OT Evaluation Precautions/Restrictions  Precautions Precautions: Fall Restrictions Weight Bearing Restrictions: No Other Position/Activity Restrictions: G-tube to LLQ  Pain Pain Assessment Pain Scale: 0-10 Pain Score: 0-No pain Home Living/Prior Functioning Home Living Living Arrangements: Alone Available Help at Discharge: Family,Available 24 hours/day Type of Home: House Home Access: Ramped entrance Home Layout: One level Bathroom Shower/Tub: Tub/shower unit,Curtain Biochemist, clinical: Handicapped height Additional Comments: Dtr and son  in room provided history secondary to pt with expressive difficulty  Lives With: Alone Prior Function Level of Independence: Independent with basic ADLs,Independent with homemaking with ambulation  Able to Take Stairs?: Yes Driving: Yes Vocation: Retired Comments: Mod Ind amb with a rollator in the home and a QC in the community, Ind with ADL including Gtube feedings, takes sponge baths, no recent fall history; two daughters and other family are able to cover 24 hr supervision if needed Vision Baseline Vision/History: Wears glasses Wears Glasses: At all times Patient Visual Report: No change from baseline Vision Assessment?: Yes Ocular Range of Motion: Within Functional Limits Alignment/Gaze Preference: Within Defined Limits Tracking/Visual Pursuits: Decreased smoothness of vertical tracking;Unable to hold eye position out of midline;Decreased smoothness of  horizontal tracking;Requires cues, head turns, or add eye shifts to track;Decreased smoothness of eye movement to RIGHT superior field;Decreased smoothness of eye movement to RIGHT inferior field Visual Fields: No apparent deficits Depth Perception: Overshoots (could be related to coordination vs visual impairment) Perception  Perception: Within Functional Limits Praxis Praxis: Intact Cognition Overall Cognitive Status: Difficult to assess (due to expressive aphasia and unable to write answers due to R hand coordination) Arousal/Alertness: Awake/alert Orientation Level: Person;Place Year:  (pt unable to verbally express or write out answer, difficulty pointing to correct answer) Month:  (pt unable to verbally express or write out answer, difficulty pointing to correct answer) Day of Week:  (pt unable to verbally express or write out answer, difficulty pointing to correct answer) Immediate Memory Recall: Sock;Blue;Bed (pt did repeat words but difficult to understand her answers, appears she recalled the words??) Memory Recall Sock:  (pt unable to verbally express or write out answer, difficulty pointing to correct answer) Memory Recall Blue:  (pt unable to verbally express or write out answer, difficulty pointing to correct answer) Memory Recall Bed:  (pt unable to verbally express or write out answer, difficulty pointing to correct answer) Sensation Sensation Light Touch: Impaired by gross assessment Hot/Cold: Impaired by gross assessment Proprioception: Impaired by gross assessment Stereognosis: Impaired by gross assessment Additional Comments: very limited sensory awareness on RUE and RLE Coordination Gross Motor Movements are Fluid and Coordinated: No Fine Motor Movements are Fluid and Coordinated: No Coordination and Movement Description: RUE incoordination - unable to hold toothbrush or sign her name Finger Nose Finger Test: dysmetria and very slow on R - slow on B sides (5x in 10  seconds) Motor  Motor Motor: Hemiplegia Motor - Skilled Clinical Observations: decreased R side strength (4-/5 overall)  Trunk/Postural Assessment  Postural Control Postural Control: Deficits on evaluation Postural Limitations: slightly kyphotic posture with posterior pelvic tilt limits her trunk control for dynamic reaching in standing  Balance Static Sitting Balance Static Sitting - Level of Assistance: 5: Stand by assistance Dynamic Sitting Balance Dynamic Sitting - Level of Assistance: 4: Min assist Static Standing Balance Static Standing - Level of Assistance: 5: Stand by assistance Dynamic Standing Balance Dynamic Standing - Level of Assistance: 4: Min assist Extremity/Trunk Assessment RUE Assessment Active Range of Motion (AROM) Comments: WFL General Strength Comments: 4-/5 LUE Assessment Active Range of Motion (AROM) Comments: WFL General Strength Comments: 4/5  Care Tool Care Tool Self Care Eating Eating activity did not occur: Safety/medical concerns      Oral Care    Oral Care Assist Level: Supervision/Verbal cueing    Bathing   Body parts bathed by patient: Right arm;Left arm;Chest;Abdomen;Front perineal area;Buttocks;Right upper leg;Left upper leg;Right lower leg;Left lower leg;Face     Assist Level: Contact Guard/Touching assist    Upper Body Dressing(including orthotics)   What is the patient wearing?: Pull over shirt   Assist Level: Minimal Assistance - Patient > 75%    Lower Body Dressing (excluding footwear)   What is the patient wearing?: Underwear/pull up;Pants Assist for lower body dressing: Moderate Assistance - Patient 50 - 74%    Putting on/Taking off footwear   What is the patient wearing?: Shoes Assist for footwear: Minimal Assistance - Patient > 75%       Care Tool Toileting Toileting activity   Assist for toileting: Minimal Assistance - Patient > 75%     Care Tool Bed Mobility Roll left and right activity   Roll left and right  assist level: Contact Guard/Touching assist    Sit to lying activity        Lying to sitting edge of bed activity   Lying to sitting edge of bed assist level: Contact Guard/Touching assist     Care Tool Transfers Sit to stand transfer   Sit to stand assist level: Supervision/Verbal cueing    Chair/bed transfer   Chair/bed transfer assist level: Minimal Assistance - Patient > 75%     Toilet transfer   Assist Level: Minimal Assistance - Patient > 75%     Care Tool Cognition Expression of Ideas and Wants Expression of Ideas and Wants: Rarely/Never expressess or very difficult - rarely/never expresses self or speech is very difficult to understand   Understanding Verbal and Non-Verbal Content Understanding Verbal and Non-Verbal Content: Usually understands - understands most conversations, but misses some part/intent of message. Requires cues at times to understand   Memory/Recall Ability *first 3 days only Memory/Recall Ability *first 3 days only: That he or she is in a [name] unit    Refer to Care Plan for [place] 1 OT Short Term Goal 1 (Week 1): STGs = LTGs  Recommendations for other services: None    Skilled Therapeutic Intervention ADL ADL Eating: NPO Grooming: Supervision/safety Where Assessed-Grooming: Standing at sink Upper Body Bathing: Supervision/safety Where Assessed-Upper Body Bathing: Shower Lower Body Bathing: Contact guard Where Assessed-Lower Body Bathing: Shower Upper Body Dressing: Minimal assistance Where Assessed-Upper Body Dressing: Chair Lower Body Dressing: Moderate assistance Where Assessed-Lower Body Dressing: Chair Toileting: Minimal assistance Where Assessed-Toileting: Glass blower/designer: Psychiatric nurse Method: Counselling psychologist: Energy manager: Environmental education officer Method: Heritage manager:  Transfer tub bench;Grab bars Mobility  Transfers Sit to Stand: Supervision/Verbal cueing Stand to Sit: Contact Guard/Touching assist  Pt seen for initial evaluation and ADL training with a focus on  Balance and learning  to use her L hand as she has great difficulty coordinating with R hand.  Pt received in bed with family in the room. Because pt is non verbal, family was able to explain her PLOF, home set up and supervision they will provide.  Pt participated extremely well and did not show any signs of fatigue.  She completed toileting, shower and dressing all with min A. Discussed role of OT, OT POC, pt's goals and ELOS. Pt's RN arrived to provide her with medications.    Discharge Criteria: Patient will be discharged from OT if patient refuses treatment 3 consecutive times without medical reason, if treatment goals not met, if there is a change in medical status, if patient makes no progress towards goals or if patient is discharged from [name].  The above assessment, treatment plan, treatment alternatives and goals were discussed and mutually agreed upon: by patient and by family  SAGUIER,JULIA 12/23/2020, 11:21 AM

## 2020-12-23 NOTE — Evaluation (Addendum)
Physical Therapy Assessment and Plan  Patient Details  Name: [name] MRN: [id number] Date of Birth: 06/22/1929  PT Diagnosis: Hemiparesis non-dominant Rehab Potential: Good ELOS: 7-10 days   Today's Date: 12/23/2020 PT Individual Time: 1300-1400 and 915-930 PT Individual Time Calculation (min): 60 min  And 15 min  Hospital Problem: Principal Problem:   Cerebrovascular accident (CVA) due to occlusion of left middle cerebral artery ([place]) Active Problems:   Esophageal stricture   Protein-calorie malnutrition, severe   Gastrostomy tube in place [facility]   Right sided weakness   Stroke (cerebrum) ([place])   Hypoalbuminemia due to protein-calorie malnutrition ([place])   Acute lower UTI   Past Medical History:  Past Medical History:  Diagnosis Date  . Anemia    IRON INFUSIONS  . Aortic valve sclerosis   . Arrhythmia   . Arthritis    osteoarthritis  . Basal cell carcinoma of skin   . Brain tumor ([place])   . Brain tumor ([place])   . Cervical spine disease   . CHF (congestive heart failure) ([place])   . Diabetes mellitus without complication ([place])   . Dysrhythmia    sinus arrhythmia  . Esophageal stricture    severe, led to feeding tube placement in Sept 2019  . Esophageal ulcer without bleeding   . Gastrostomy tube dependent ([place])    DOES NOT EAT OR DRINK   . GERD (gastroesophageal reflux disease)   . History of kidney stones   . Hyperlipemia   . Hypertension   . Kidney stones   . Leaky heart valve   . Meningioma ([place])   . Occlusive mesenteric ischemia ([place])   . Pulmonary hypertension ([place])   . RLS (restless legs syndrome)   . Vertigo    Past Surgical History:  Past Surgical History:  Procedure Laterality Date  . ABDOMINAL HYSTERECTOMY    . APPENDECTOMY    . ARTHROGRAM KNEE Left   . BACK SURGERY     CERVIVAL NECK FUSION  . CATARACT EXTRACTION W/PHACO Left 11/11/2018   Procedure: CATARACT EXTRACTION PHACO AND INTRAOCULAR LENS PLACEMENT (IOC) LEFT, DIABETIC;  Surgeon:  Birder Robson, MD;  Location: [facility] ORS;  Service: Ophthalmology;  Laterality: Left;  [place]  00:41 CDE 6.41 Fluid pack lot # [id number]  . COLONOSCOPY WITH PROPOFOL N/A 06/20/2017   Procedure: COLONOSCOPY WITH PROPOFOL;  Surgeon: Lollie Sails, MD;  Location: [facility] ENDOSCOPY;  Service: Endoscopy;  Laterality: N/A;  . ESOPHAGOGASTRODUODENOSCOPY (EGD) WITH PROPOFOL N/A 03/14/2015   Procedure: ESOPHAGOGASTRODUODENOSCOPY (EGD) WITH PROPOFOL;  Surgeon: Josefine Class, MD;  Location: [facility] ENDOSCOPY;  Service: Endoscopy;  Laterality: N/A;  . ESOPHAGOGASTRODUODENOSCOPY (EGD) WITH PROPOFOL N/A 03/28/2017   Procedure: ESOPHAGOGASTRODUODENOSCOPY (EGD) WITH PROPOFOL;  Surgeon: Lollie Sails, MD;  Location: [facility] ENDOSCOPY;  Service: Endoscopy;  Laterality: N/A;  . ESOPHAGOGASTRODUODENOSCOPY (EGD) WITH PROPOFOL N/A 06/20/2017   Procedure: ESOPHAGOGASTRODUODENOSCOPY (EGD) WITH PROPOFOL;  Surgeon: Lollie Sails, MD;  Location: [facility] ENDOSCOPY;  Service: Endoscopy;  Laterality: N/A;  . ESOPHAGOGASTRODUODENOSCOPY (EGD) WITH PROPOFOL N/A 10/21/2017   Procedure: ESOPHAGOGASTRODUODENOSCOPY (EGD) WITH PROPOFOL;  Surgeon: Lollie Sails, MD;  Location: [facility] ENDOSCOPY;  Service: Endoscopy;  Laterality: N/A;  . ESOPHAGOGASTRODUODENOSCOPY (EGD) WITH PROPOFOL N/A 04/15/2018   Procedure: ESOPHAGOGASTRODUODENOSCOPY (EGD) WITH PROPOFOL;  Surgeon: Lollie Sails, MD;  Location: [facility] END[facility]COPY;  Service: Endoscopy;  Laterality: N/A;  . ESOPHAGUS SURGERY     CLOSURE  . EYE SURGERY    . HYSTERECTOMY ABDOMINAL WITH SALPINGECTOMY    . IR GASTROSTOMY TUBE  MOD SED  06/11/2018  . IR GASTROSTOMY TUBE REMOVAL  03/25/2019  . IR REPLACE G-TUBE SIMPLE WO FLUORO  10/19/2020  . PERIPHERAL VASCULAR CATHETERIZATION N/A 01/23/2016   Procedure: Visceral Venography;  Surgeon: Algernon Huxley, MD;  Location: [place] CV LAB;  Service: Cardiovascular;  Laterality: N/A;  . PERIPHERAL VASCULAR CATHETERIZATION  01/23/2016    Procedure: Peripheral Vascular Intervention;  Surgeon: Algernon Huxley, MD;  Location: [place] CV LAB;  Service: Cardiovascular;;  . UPPER ESOPHAGEAL ENDOSCOPIC ULTRASOUND (EUS) N/A 12/05/2017   Procedure: UPPER ESOPHAGEAL ENDOSCOPIC ULTRASOUND (EUS);  Surgeon: Jola Schmidt, MD;  Location: [facility] ENDOSCOPY;  Service: Endoscopy;  Laterality: N/A;  . VISCERAL ANGIOGRAPHY N/A 03/18/2017   Procedure: Visceral Angiography;  Surgeon: Algernon Huxley, MD;  Location: [place] CV LAB;  Service: Cardiovascular;  Laterality: N/A;  . VISCERAL ARTERY INTERVENTION N/A 03/18/2017   Procedure: Visceral Artery Intervention;  Surgeon: Algernon Huxley, MD;  Location: [place] CV LAB;  Service: Cardiovascular;  Laterality: N/A;    Assessment & Plan Clinical Impression: P.  Patient transferred to CIR on 12/22/2020 .  She was living independently PTA, drove short distances, and was active per family.  Family expressed a desire for pt to e as Independent as possible prior to DC to decrease caregiver needs.  Patient currently requires min with mobility secondary to muscle weakness, decreased cardiorespiratoy endurance and impaired timing and sequencing, unbalanced muscle activation and decreased coordination.  Prior to hospitalization, patient was independent  with mobility and lived with Alone in a House home.  Home access is  Ramped entrance.  Pt was active and enjoyed cooking for family/friends even though she did not eat/has PEG.  Today she scored a Patient demonstrates increased fall risk as noted by score of  33 /56 on Berg Balance Scale.  (<36= high risk for falls, close to 100%; 37-45 significant >80%; 46-51 moderate >50%; 52-55 lower >25%)  Patient will benefit from skilled PT intervention to maximize safe functional mobility, minimize fall risk and decrease caregiver burden for planned discharge home with intermittent assist.  Anticipate patient will benefit from either HHPT or OPT depending on availability of  transportation at discharge.  PT - End of Session Activity Tolerance: Tolerates 30+ min activity with multiple rests Endurance Deficit: Yes PT Assessment Rehab Potential (ACUTE/IP ONLY): Good PT Patient demonstrates impairments in the following area(s): Balance;Motor;Pain;Safety;Sensory PT Transfers Functional Problem(s): Bed to Chair;Car;Furniture PT Locomotion Functional Problem(s): Ambulation;Stairs PT Plan PT Intensity: Minimum of 1-2 x/[name] ,45 to 90 minutes PT Frequency: 5 out of 7 days PT Duration Estimated Length of Stay: 7-10 days PT Treatment/Interventions: Ambulation/gait training;Stair training;Disease management/prevention;Balance/vestibular training;Patient/family education;Therapeutic Activities;Psychosocial support;Therapeutic Exercise;Functional mobility training;UE/LE Strength taining/ROM;Discharge planning;Neuromuscular re-education;UE/LE Coordination activities PT Transfers Anticipated Outcome(s): mod I PT Locomotion Anticipated Outcome(s): mod I PT Recommendation Follow Up Recommendations: Outpatient PT;Home health PT (depending on transportation) Patient destination: Home Equipment Recommended: To be determined   PT Evaluation Precautions/Restrictions Precautions Precautions: Fall Restrictions Weight Bearing Restrictions: No Other Position/Activity Restrictions: PEG General   Vital SignsTherapy Vitals Pulse Rate: (!) 55 Resp: 14 BP: (!) 152/50 Patient Position (if appropriate): Sitting Oxygen Therapy SpO2: 100 % O2 Device: Room Air Pain Pain Assessment Pain Scale: 0-10 Pain Score: 0-No pain Home Living/Prior Functioning Home Living Available Help at Discharge: Family;Available 24 hours/[name] Type of Home: House Home Access: Ramped entrance Home Layout: One level (has basement but does not have to use) Bathroom Shower/Tub: Tub/shower unit;Curtain Bathroom Toilet: Handicapped height (has buildup attatchement to seat)  Bathroom Accessibility:  Yes Additional Comments: Dtr and son  in room provided history secondary to pt with expressive difficulty  Lives With: Alone Prior Function Level of Independence: Independent with basic ADLs;Independent with homemaking with ambulation  Able to Take Stairs?: Yes Driving: Yes Vocation: Retired Comments: likes to [employer] for family and friends Vision/Perception  Vision - Risk analyst: Within Radio broadcast assistant Overall Cognitive Status: Difficult to assess Arousal/Alertness: Awake/alert Orientation Level: Oriented to person;Oriented to place;Oriented to situation Sensation Sensation Light Touch: Impaired by gross assessment Hot/Cold: Impaired by gross assessment Proprioception: Impaired by gross assessment Stereognosis: Impaired by gross assessment Additional Comments: difficulty to fully assess but significant functional deficits noted Motor  Motor Motor: Hemiplegia Motor - Skilled Clinical Observations: decreased strength and sensory awareness R exts, pt is L hand dominant   Trunk/Postural Assessment  Cervical Assessment Cervical Assessment: Within Functional Limits Thoracic Assessment Thoracic Assessment: Within Functional Limits Lumbar Assessment Lumbar Assessment: Within Functional Limits Postural Control Postural Control: Deficits on evaluation Righting Reactions: delayed on r Protective Responses: impaired R Postural Limitations: slightly kyphotic posture with posterior pelvic tilt limits her trunk control for dynamic reaching in standing.  age related changes.  Balance Balance Balance Assessed: Yes Standardized Balance Assessment Standardized Balance Assessment: Berg Balance Test Berg Balance Test Sit to Stand: Able to stand without using hands and stabilize independently Standing Unsupported: Able to stand safely 2 minutes Sitting with Back Unsupported but Feet Supported on Floor or Stool: Able to sit safely and securely 2 minutes Stand to Sit:  Sits safely with minimal use of hands Transfers: Able to transfer safely, definite need of hands Standing Unsupported with Eyes Closed: Able to stand 10 seconds with supervision Standing Ubsupported with Feet Together: Needs help to attain position but able to stand for 30 seconds with feet together From Standing, Reach Forward with Outstretched Arm: Can reach forward >5 cm safely (2") From Standing Position, Pick up Object from Floor: Unable to pick up shoe, but reaches 2-5 cm (1-2") from shoe and balances independently From Standing Position, Turn to Look Behind Over each Shoulder: Looks behind from both sides and weight shifts well Turn 360 Degrees: Able to turn 360 degrees safely but slowly Standing Unsupported, Alternately Place Feet on Step/Stool: Needs assistance to keep from falling or unable to try Standing Unsupported, One Foot in Front: Loses balance while stepping or standing Standing on One Leg: Unable to try or needs assist to prevent fall Total Score: 33 Extremity Assessment  RUE Assessment Active Range of Motion (AROM) Comments: WFL General Strength Comments: 4-/5 LUE Assessment Active Range of Motion (AROM) Comments: WFL General Strength Comments: 4/5 RLE Assessment RLE Assessment: Within Functional Limits LLE Assessment LLE Assessment: Within Functional Limits  Care Tool Care Tool Bed Mobility Roll left and right activity   Roll left and right assist level: Independent    Sit to lying activity   Sit to lying assist level: Supervision/Verbal cueing    Lying to sitting edge of bed activity   Lying to sitting edge of bed assist level: Supervision/Verbal cueing     Care Tool Transfers Sit to stand transfer   Sit to stand assist level: Supervision/Verbal cueing    Chair/bed transfer   Chair/bed transfer assist level: Contact Guard/Touching assist     Toilet transfer        Car transfer   Car transfer assist level: Contact Guard/Touching assist      Care  Tool Locomotion Ambulation   Assist level: Contact  Guard/Touching assist Assistive device: Walker-rolling Max distance: 150  Walk 10 feet activity   Assist level: Contact Guard/Touching assist Assistive device: Walker-rolling   Walk 50 feet with 2 turns activity   Assist level: Contact Guard/Touching assist Assistive device: Walker-rolling  Walk 150 feet activity   Assist level: Contact Guard/Touching assist Assistive device: Walker-rolling  Walk 10 feet on uneven surfaces activity   Assist level: Contact Guard/Touching assist Assistive device: Walker-rolling  Stairs   Assist level: Contact Guard/Touching assist Stairs assistive device: 2 hand rails Max number of stairs: 4  Walk up/down 1 step activity   Walk up/down 1 step (curb) assist level: Moderate Assistance - Patient - 50 - 74% Walk up/down 1 step or curb assistive device: No device    Walk up/down 4 steps activity Walk up/down 4 steps assist level: Contact Guard/Touching assist Walk up/down 4 steps assistive device: 2 hand rails  Walk up/down 12 steps activity Walk up/down 12 steps activity did not occur: Safety/medical concerns      Pick up small objects from floor   Pick up small object from the floor assist level: Minimal Assistance - Patient > 75% Pick up small object from the floor assistive device: HHA  Wheelchair Will patient use wheelchair at discharge?: No          Wheel 50 feet with 2 turns activity      Wheel 150 feet activity        Refer to Care Plan for [place] 1 PT Short Term Goal 1 (Week 1): STGs=LTGs  Recommendations for other services: None   Skilled Therapeutic Intervention   Evaluation completed (see details above and below) with education on PT POC and goals and individual treatment initiated with focus on functional mobility/transfers, LE strength, dynamic standing balance/coordination, ambulation, stair navigation, simulated car transfers, and improved  endurance with activity Pt  oriented to rehab unit.  Discussed process of daily scheduling and receiving schedule, purpose  And team conference schedule and D/c planning w/pt and family.  Pt provided w/ 18x16 wheelchair and Ulice Dash cushion due to chart indicating redness on sacrum and adjustments made to promote optimal seating posture and pressure distribution.   Pt performed gait on level surfaces, ramp, stairs and performed bed mobility, car transfers, and all NMRE activities included in BERG balance assessment.  At end of session, pt was assisted to commed w/cga and RW and handed off to nursing as session timed out.   Mobility Bed Mobility Bed Mobility: (P) Rolling Right;Rolling Left;Right Sidelying to Sit;Sit to Supine Rolling Right: Independent Rolling Left: Independent Right Sidelying to Sit: Contact Guard/Touching assist Sit to Supine: Contact Guard/Touching assist Transfers Transfers: Sit to Stand;Stand to Sit;Stand Pivot Transfers Sit to Stand: Contact Guard/Touching assist Stand to Sit: Contact Guard/Touching assist Stand Pivot Transfers: Contact Guard/Touching assist Transfer (Assistive device): Rolling walker Locomotion  Gait Ambulation: Yes Gait Assistance: Contact Guard/Touching assist Gait Distance (Feet): 15 Feet Assistive device: Rolling walker Gait Gait: Yes Gait Pattern: Impaired (decreased control of impact force RLE, mild R lean, poor control of R hand placement/grip of RW) Gait velocity: decreased Stairs / Additional Locomotion Stairs: Yes Stairs Assistance: Contact Guard/Touching assist Stair Management Technique: Two rails Number of Stairs: 4 Ramp: Contact Guard/touching assist Curb: Minimal Assistance - Patient >75% Wheelchair Mobility Wheelchair Mobility: No   Discharge Criteria: Patient will be discharged from PT if patient refuses treatment 3 consecutive times without medical reason, if treatment goals not met, if there is  a change in medical status, if  patient makes no progress towards goals or if patient is discharged from hospital.  The above assessment, treatment plan, treatment alternatives and goals were discussed and mutually agreed upon: by patient and by family  Jerrilyn Cairo 12/23/2020, 3:17 PM

## 2020-12-23 NOTE — Progress Notes (Signed)
[facility] Individual Statement of Services  Patient Name:  SHARMAYNE JABLON Day  Date:  12/23/2020  Welcome to the [facility].  Our goal is to provide you with an individualized program based on your diagnosis and situation, designed to meet your specific needs.  With this comprehensive rehabilitation program, you will be expected to participate in at least 3 hours of rehabilitation therapies Monday-Friday, with modified therapy programming on the weekends.  Your rehabilitation program will include the following services:  Physical Therapy (PT), Occupational Therapy (OT), Speech Therapy (ST), 24 hour per day rehabilitation nursing, Care Coordinator, Rehabilitation Medicine, Nutrition Services and Pharmacy Services  Weekly team conferences will be held on Wednesday to discuss your progress.  Your Inpatient Rehabilitation Care Coordinator will talk with you frequently to get your input and to update you on team discussions.  Team conferences with you and your family in attendance may also be held.  Expected length of stay: 7-10 days  Overall anticipated outcome: supervision with cueing  Depending on your progress and recovery, your program may change. Your Inpatient Rehabilitation Care Coordinator will coordinate services and will keep you informed of any changes. Your Inpatient Rehabilitation Care Coordinator's name and contact numbers are listed  below.  The following services may also be recommended but are not provided by the [facility] will be made to provide these services after discharge if needed.  Arrangements include referral to agencies that provide these services.  Your insurance has been verified to be:  [employer] primary doctor is:  Immunologist  Pertinent information will be shared with your  doctor and your insurance company.  Inpatient Rehabilitation Care Coordinator:  Ovidio Kin, [place] or Emilia Beck  Information discussed with and copy given to patient by: Elease Hashimoto, 12/23/2020, 9:55 AM

## 2020-12-23 NOTE — Progress Notes (Signed)
Initial Nutrition Assessment  RD working remotely.  DOCUMENTATION CODES:   Not applicable (history of moderate malnutrition, need NFPE to confirm)  INTERVENTION:   Continue bolus tube feeds via PEG: - 1 carton (325 ml) Dillard [place] Standard 1.4 formula TID with free water flush of 50 ml before and after each bolus - ProSource TF 45 ml BID - Additional free water flushes of 100 ml QID  Tube feeding regimen and free water flushes provide 1445 kcal, 82 grams of protein, and 1402 ml of H2O.  NUTRITION DIAGNOSIS:   Inadequate oral intake related to altered GI function (esophageal stricture) as evidenced by NPO status,other (G-tube dependent).  GOAL:   Patient will meet greater than or equal to 90% of their needs  MONITOR:   Labs,Weight trends,TF tolerance,I & O's  REASON FOR ASSESSMENT:   New TF    ASSESSMENT:   87 year old female with PMH of T2DM, CHF, PAD, HLD, HTN, CHF, meningioma, DDD of cervical spine s/p decompression, mesenteric ischemia with dumping syndrome (diarrhea after tube feeding boluses), esophageal stricture who is tube feeding dependent. Pt [facility] to [facility] on [facility]17[facility] with confusion, weakness, and difficulty speaking. Pt found to have L MCA stroke. Admitted to CIR on 3/24.   Attempted to reach pt via phone call to room; however, no answer.  Reviewed RD note from acute admission. Pt with a history of chronic diarrhea related to tube feeding. Home tube feeding regimen was 1 carton (237 ml) Jevity 1.2 formula TID. Pt with difficulty tolerating both Jevity and Osmolite formulas (both bolus and continuous) so [employer] formula started and pt with better tolerance and noticeable improvement in diarrhea.  Per notes, pt consumed some soft foods by mouth for comfort PTA.  Reviewed available weight history in chart. Weight stable over the last year with fluctuations between 51-53 kg. Current weight is 54.8 kg which is above UBW range.  Pt with senokot, colace,  and miralax ordered. Pt with a history of chronic diarrhea that has just started to improve with initiation of [employer] tube feeding formula. Noted pt just had a type 7 bowel movement on 3/24. Discussed concerns with PA who reports these medications are all necessary.  Pt with a history of moderate malnutrition. Suspect malnutrition persists. Need NFPE to confirm.  Current TF: 325 ml Dillard [place] Standard 1.4 formula TID, ProSource TF 45 ml daily, free water flushes of 100 ml q 6 hours  Medications reviewed and include: senokot, colace, lasix, SSI, IV protonix, miralax, IV abx  Labs reviewed: BUN 41, hemoglobin 9.3 CBG's: 116-191 x 24 hours  NUTRITION - FOCUSED PHYSICAL EXAM:  Unable to complete at this time. RD working remotely.  Diet Order:   Diet Order            Diet NPO time specified  Diet effective now                 EDUCATION NEEDS:   No education needs have been identified at this time  Skin:  Skin Assessment: Reviewed RN Assessment  Last BM:  3/24 medium type 4  Height:   Ht Readings from Last 1 Encounters:  12/17/20 5\' 7"  (1.702 m)    Weight:   Wt Readings from Last 1 Encounters:  12/23/20 54.8 kg    BMI:  Body mass index is 18.92 kg/m.  Estimated Nutritional Needs:   Kcal:  1450-1650  Protein:  70-85 grams  Fluid:  1.4-1.6 L    Gustavus Bryant, MS, RD, LDN  Inpatient Clinical Dietitian Please see AMiON for contact information.

## 2020-12-23 NOTE — Evaluation (Signed)
Speech Language Pathology Assessment and Plan  Patient Details  Name: [name] [name] MRN: [id number] Date of Birth: 06/26/1929  SLP Diagnosis: Aphasia;Dysarthria;Apraxia  Rehab Potential: Good ELOS: 7-10 days    Today's Date: 12/23/2020 SLP Individual Time: 1435-1530 SLP Individual Time Calculation (min): 55 min   Hospital Problem: Principal Problem:   Cerebrovascular accident (CVA) due to occlusion of left middle cerebral artery (HCC) Active Problems:   Esophageal stricture   Protein-calorie malnutrition, severe   Gastrostomy tube in place [facility])   Right sided weakness   Stroke (cerebrum) (HCC)   Hypoalbuminemia due to protein-calorie malnutrition ([place])   Acute lower UTI  Past Medical History:  Past Medical History:  Diagnosis Date  . Anemia    IRON INFUSIONS  . Aortic valve sclerosis   . Arrhythmia   . Arthritis    osteoarthritis  . Basal cell carcinoma of skin   . Brain tumor ([place])   . Brain tumor ([place])   . Cervical spine disease   . CHF (congestive heart failure) ([place])   . Diabetes mellitus without complication ([place])   . Dysrhythmia    sinus arrhythmia  . Esophageal stricture    severe, led to feeding tube placement in Sept 2019  . Esophageal ulcer without bleeding   . Gastrostomy tube dependent ([place])    DOES NOT EAT OR DRINK   . GERD (gastroesophageal reflux disease)   . History of kidney stones   . Hyperlipemia   . Hypertension   . Kidney stones   . Leaky heart valve   . Meningioma ([place])   . Occlusive mesenteric ischemia ([place])   . Pulmonary hypertension ([place])   . RLS (restless legs syndrome)   . Vertigo    Past Surgical History:  Past Surgical History:  Procedure Laterality Date  . ABDOMINAL HYSTERECTOMY    . APPENDECTOMY    . ARTHROGRAM KNEE Left   . BACK SURGERY     CERVIVAL NECK FUSION  . CATARACT EXTRACTION W/PHACO Left 11/11/2018   Procedure: CATARACT EXTRACTION PHACO AND INTRAOCULAR LENS PLACEMENT (IOC) LEFT, DIABETIC;  Surgeon:  Birder Robson, MD;  Location: [facility] ORS;  Service: Ophthalmology;  Laterality: Left;  [place]  00:41 CDE 6.41 Fluid pack lot # [id number]  . COLONOSCOPY WITH PROPOFOL N/A 06/20/2017   Procedure: COLONOSCOPY WITH PROPOFOL;  Surgeon: Lollie Sails, MD;  Location: [facility] ENDOSCOPY;  Service: Endoscopy;  Laterality: N/A;  . ESOPHAGOGASTRODUODENOSCOPY (EGD) WITH PROPOFOL N/A 03/14/2015   Procedure: ESOPHAGOGASTRODUODENOSCOPY (EGD) WITH PROPOFOL;  Surgeon: Josefine Class, MD;  Location: [facility] ENDOSCOPY;  Service: Endoscopy;  Laterality: N/A;  . ESOPHAGOGASTRODUODENOSCOPY (EGD) WITH PROPOFOL N/A 03/28/2017   Procedure: ESOPHAGOGASTRODUODENOSCOPY (EGD) WITH PROPOFOL;  Surgeon: Lollie Sails, MD;  Location[facility] ENDOSCOPY;  Service: Endoscopy;  Laterality: N/A;  . ESOPHAGOGASTRODUODENOSCOPY (EGD) WITH PROPOFOL N/A 06/20/2017   Procedure: ESOPHAGOGASTRODUODENOSCOPY (EGD) WITH PROPOFOL;  Surgeon: Lollie Sails, MD;  Location: [facility] ENDOSCOPY;  Service: Endoscopy;  Laterality: N/A;  . ESOPHAGOGASTRODUODENOSCOPY (EGD) WITH PROPOFOL N/A 10/21/2017   Procedure: ESOPHAGOGASTRODUODENOSCOPY (EGD) WITH PROPOFOL;  Surgeon: Lollie Sails, MD;  Location[facility] ENDOSCOPY;  Service: Endoscopy;  Laterality: N/A;  . ESOPHAGOGASTRODUODENOSCOPY (EGD) WITH PROPOFOL N/A 04/15/2018   Procedure: ESOPHAGOGASTRODUODENOSCOPY (EGD) WITH PROPOFOL;  Surgeon: Lollie Sails, MD;  Location: [facility] ENDOSCOPY;  Service: Endoscopy;  Laterality: N/A;  . ESOPHAGUS SURGERY     CLOSURE  . EYE SURGERY    . HYSTERECTOMY ABDOMINAL WITH SALPINGECTOMY    . IR GASTROSTOMY TUBE MOD SED  06/11/2018  .  IR GASTROSTOMY TUBE REMOVAL  03/25/2019  . IR REPLACE G-TUBE SIMPLE WO FLUORO  10/19/2020  . PERIPHERAL VASCULAR CATHETERIZATION N/A 01/23/2016   Procedure: Visceral Venography;  Surgeon: Algernon Huxley, MD;  Location: [place] CV LAB;  Service: Cardiovascular;  Laterality: N/A;  . PERIPHERAL VASCULAR CATHETERIZATION  01/23/2016    Procedure: Peripheral Vascular Intervention;  Surgeon: Algernon Huxley, MD;  Location: [place] CV LAB;  Service: Cardiovascular;;  . UPPER ESOPHAGEAL ENDOSCOPIC ULTRASOUND (EUS) N/A 12/05/2017   Procedure: UPPER ESOPHAGEAL ENDOSCOPIC ULTRASOUND (EUS);  Surgeon: Jola Schmidt, MD;  Location: [facility] ENDOSCOPY;  Service: Endoscopy;  Laterality: N/A;  . VISCERAL ANGIOGRAPHY N/A 03/18/2017   Procedure: Visceral Angiography;  Surgeon: Algernon Huxley, MD;  Location: [place] CV LAB;  Service: Cardiovascular;  Laterality: N/A;  . VISCERAL ARTERY INTERVENTION N/A 03/18/2017   Procedure: Visceral Artery Intervention;  Surgeon: Algernon Huxley, MD;  Location: [place] CV LAB;  Service: Cardiovascular;  Laterality: N/A;    Assessment / Plan / Recommendation  [name] is an 87 year old LH- female with history of T2DM, CHF, meningioma, DDD- Cervical spine s/p decompression, mesenteric ischemia with dumping syndrome (diarrhea after boluses), esophageal stricture --tube feed dependent who was admitted to [facility] on 12/17/20 with confusion, weakness and difficulty speaking. She was hypoxic at admission and developed increased WOB requiring BIPAP, IV NTG and diuresis due to pulmonary edema/overaload. CT head done revealing acute/subacute L-MCA infarct. MRI brain done revealing acute infarct in deep insula and frontoparietal junction on left about 6 cm in diameter with mild swelling and minimal petechial blood. CTA head/neck showed dsmall aneurysm v/s infundibulum supraclinoid R-ICA, left M2 MCA branch occlusion in acute infarct territory, moderate to severe stenosis of multiple proximal right M2 MCA branches, severe stenosis at R-ACA A2/A3 junction and moderate stenosis B-VA at skull base. 2D echo showed EF 35-40% with LV global kinesis, mild AVR and grade II DD.   Neurology recommended permissive HTN and felt that incidental finding of aneurysm v/s infundibulum presented low change of hemorrhage and no  intervention given advanced age. DAPT added with recommendations of Plavix for 90 days. She was weaned to [facility] and Coreg resumed due to labile BP. Tube feeds changed to [facility] with reports of increase tolerance of formula. Patient with resultant mild right sided weakness with expressive deficits and balance deficits affecting functional status. CIR recommended due to functional decline.   Patient transferred to CIR on 12/22/2020 .    Clinical Impression Patient presents with a mod expressive aphasia, questionable mild receptive aphasia versus impact from apraxia and moderate dysarthria (ataxic vs flaccid vs combination).  She does not exhibit any significant cognitive impairment and has premorbid esophageal dysphagia with long term PEG and only consuming ice chips orally. Patient able to name objects, pictures, describe/name verbs/actions, respond verbally to open ended and yes/no biographical and orientation questions. Dysarthria is more pronounced with lingual phonemes and patient unable to achieve adequate tongue tip elevation. In addition, she exhibits instances of appearing to force sounds out when speaking at multisyllabic and phrase levels. She exhibits perseveration on previous sound/word with awareness but inability to correct. Patient is very non-verbally expressive and able to use gestures and facial expressions adequately, with no apparent impairment of her pragmatic language.  Skilled Therapeutic Interventions          Speech-language evaluation  SLP Assessment  Patient will need skilled [place] [facility] during CIR admission    Recommendations  Patient  destination: Home Follow up Recommendations: Outpatient SLP;Home Health SLP Equipment Recommended: None recommended by SLP    SLP Frequency 3 to 5 out of 7 days   SLP Duration  SLP Intensity  SLP Treatment/Interventions 7-10 days  Minumum of 1-2 x/[name], 30 to 90 minutes  Speech/Language facilitation;Cueing  hierarchy;Multimodal communication approach;Patient/family education    Pain Pain Assessment Pain Scale: 0-10 Pain Score: 0-No pain  Prior Functioning Cognitive/Linguistic Baseline: Within functional limits Type of Home: House  Lives With: Alone Available Help at Discharge: Family;Available 24 hours/[name] Vocation: Retired  Programmer, systems Overall Cognitive Status: Difficult to assess Arousal/Alertness: Awake/alert Orientation Level: Oriented to person;Oriented to place;Oriented to situation;Oriented to time;Oriented X4 Attention: Selective Selective Attention: Appears intact Memory: Appears intact Awareness: Appears intact Problem Solving: Appears intact Safety/Judgment: Appears intact  Comprehension Auditory Comprehension Overall Auditory Comprehension: Impaired Yes/No Questions: Within Functional Limits Commands: Impaired Two Step Basic Commands: 75-100% accurate Multistep Basic Commands: 50-74% accurate Reading Comprehension Reading Status: Impaired Word level: Within functional limits Sentence Level: Impaired Paragraph Level: Not tested Functional Environmental (signs, name badge): Within functional limits Expression Expression Primary Mode of Expression: Verbal Verbal Expression Overall Verbal Expression: Impaired Initiation: No impairment Level of Generative/Spontaneous Verbalization: Phrase Repetition: Impaired Level of Impairment: Phrase level;Word level Naming: Impairment Responsive: 26-50% accurate Confrontation: Impaired Convergent: 50-74% accurate Divergent: Not tested Verbal Errors: Aware of errors;Semantic paraphasias;Perseveration;Phonemic paraphasias Pragmatics: No impairment Interfering Components: Speech intelligibility Effective Techniques: Sentence completion;Semantic cues Non-Verbal Means of Communication: Not applicable Written Expression Dominant Hand: Right Written Expression: Not tested Oral Motor Oral Motor/Sensory  Function Overall Oral Motor/Sensory Function: Mild impairment Lingual ROM: Reduced right;Reduced left Lingual Strength: Reduced Motor Speech Overall Motor Speech: Impaired Respiration: Within functional limits Phonation: Normal;Low vocal intensity Resonance: Within functional limits Articulation: Impaired Intelligibility: Intelligibility reduced Word: 50-74% accurate Phrase: 25-49% accurate Sentence: Not tested Conversation: Not tested Motor Planning: Impaired Level of Impairment: Word Motor Speech Errors: Inconsistent;Aware Effective Techniques: Slow rate  Care Tool Care Tool Cognition Expression of Ideas and Wants Expression of Ideas and Wants: Frequent difficulty - frequently exhibits difficulty with expressing needs and ideas   Understanding Verbal and Non-Verbal Content Understanding Verbal and Non-Verbal Content: Usually understands - understands most conversations, but misses some part/intent of message. Requires cues at times to understand   Memory/Recall Ability *first 3 days only Memory/Recall Ability *first 3 days only: That he or she is in a hospital/hospital unit;Current season     PMSV Assessment  PMSV Trial Intelligibility: Intelligibility reduced Word: 50-74% accurate Phrase: 25-49% accurate Sentence: Not tested Conversation: Not tested  Short Term Goals: Week 1: SLP Short Term Goal 1 (Week 1): Patient will imitate to produce 3-syllable words with 80% accuracy and minA cues SLP Short Term Goal 2 (Week 1): Patient will describe at 3-4 word phrase level with min-modA cues. SLP Short Term Goal 3 (Week 1): Patient will respond to basic level open-ended questions at 2-word phrase level with modA cues. SLP Short Term Goal 4 (Week 1): Patient will follow 3-step verbal commands/directions with minA cues. SLP Short Term Goal 5 (Week 1): Patient will initiate requests/comments to have needs/wants met with supervisionA.  Refer to Care Plan for Long Term  Goals  Recommendations for other services: None   Discharge Criteria: Patient will be discharged from SLP if patient refuses treatment 3 consecutive times without medical reason, if treatment goals not met, if there is a change in medical status, if patient makes no progress towards goals or if patient is discharged from  hospital.  The above assessment, treatment plan, treatment alternatives and goals were discussed and mutually agreed upon: by patient and by family  Sonia Baller, MA, CCC-SLP Speech Therapy

## 2020-12-23 NOTE — Progress Notes (Signed)
[place] PHYSICAL MEDICINE & REHABILITATION PROGRESS NOTE  Subjective/Complaints: Patient seen sitting up in bed this AM.  She states she slept well overnight. Family at bedside.  She is ready to begin therapies.   ROS: Denies CP, SOB, N/V/D  Objective: Vital Signs: Blood pressure (!) 142/69, pulse 71, temperature 97.6 F (36.4 C), temperature source Oral, resp. rate 18, weight 54.8 kg, SpO2 100 %. No results found. Recent Labs    12/22/20 1502 12/23/20 0503  WBC 7.5 6.3  HGB 9.7* 9.3*  HCT 31.0* 30.0*  PLT 269 272   Recent Labs    12/22/20 1502 12/23/20 0503  NA 137 137  K 4.6 4.5  CL 103 104  CO2 27 27  GLUCOSE 277* 158*  BUN 47* 41*  CREATININE 1.05* 0.96  CALCIUM 8.1* 8.3*   No intake or output data in the 24 hours ending 12/23/20 1013      Physical Exam: BP (!) 142/69 (BP Location: Left Arm)    Pulse 71    Temp 97.6 F (36.4 C) (Oral)    Resp 18    Wt 54.8 kg    SpO2 100%    BMI 18.92 kg/m  Constitutional: No distress . Vital signs reviewed. HENT: Normocephalic.  Atraumatic. Eyes: EOMI. No discharge. Cardiovascular: No JVD.  RRR. Respiratory: Normal effort.  No stridor.  Bilateral clear to auscultation. GI: Non-distended.  BS +. +PEG. Skin: Warm and dry.  Intact. Scattered ecchymosis Psych: Normal mood.  Normal behavior. Musc: No edema in extremities.  No tenderness in extremities. Neuro: Alert and oriented to person and place Sensation diminished to light touch right side Dysarthria Mild expressive aphasia Motor: LUE/LLE: 5/5 proximal to distal RUE: 4+/5 proximal to distal RLE: 4-/5 proximal distal  Assessment/Plan: 1. Functional deficits which require 3+ hours per day of interdisciplinary therapy in a comprehensive inpatient rehab setting.  Physiatrist is providing close team supervision and 24 hour management of active medical problems listed below.  Physiatrist and rehab team continue to assess barriers to discharge/monitor patient progress  toward functional and medical goals   Care Tool:  Bathing              Bathing assist       Upper Body Dressing/Undressing Upper body dressing        Upper body assist      Lower Body Dressing/Undressing Lower body dressing            Lower body assist       Toileting Toileting    Toileting assist       Transfers Chair/bed transfer  Transfers assist           Locomotion Ambulation   Ambulation assist              Walk 10 feet activity   Assist           Walk 50 feet activity   Assist           Walk 150 feet activity   Assist           Walk 10 feet on uneven surface  activity   Assist           Wheelchair     Assist               Wheelchair 50 feet with 2 turns activity    Assist            Wheelchair 150 feet activity  Assist           Medical Problem List and Plan: 1.  Right hemiparesis as well as expressive aphasia secondary to L MCA stroke  Begin CIR evaluations 2.  Antithrombotics: -DVT/anticoagulation:  Mechanical: Sequential compression devices, below knee Bilateral lower extremities  Lovenox restarted             -antiplatelet therapy: ASA/Plavix 3. Headaches/Pain Management: Tylenol prn  Monitor with increased exertion 4. Mood: LCSW to follow for evaluation and support.              -antipsychotic agents: N/A 5. Neuropsych: This patient is ?fully capable of making decisions on her own behalf. 6. Skin/Wound Care: Routine pressure relief measures.  7. Fluids/Electrolytes/Nutrition: PEG in place  Tube feeds qid with water flushes.             Tolerating "[employer]"--asked family to check into vegan supplemental feeds.  8. Acute on chronic systolic CHF: Compensated. Will monitor weight daily and for signs of overload            On Coreg bid, Lasix and ASA--no statin due to myalgias.             Continue to hold Lisinopril and Spironolactone.  Filed Weights    12/23/20 0353  Weight: 54.8 kg   9. Pre-renal azotemia: BUN around 50 at baseline. Continue water flushes.             BUN/Cr 41/0.96 on 3/25  10. T2DM: Hgb A1c-6.9. Monitor BS ac/hs prior to tube feeds.   Continue SSI for elevated BS  Monitor with increased mobility 11. HTN: Monitor BP tid--continue Coreg and Lasix.   Monitor with increased mobility 12. Esophageal stricture/mesenteric ischemia:   On tube feeds qid--tolerating current formula without diarrhea.   G tube was replaced on 10/2020  13. UTI: On Rocephin D# 3.   Cultures pending 14. Constipation:   Continue Senna S with miralax.             Hb 9.3 on 3/25 15. Dysphagia:  Cont PEG  See #12  Was having sips and bites-we will await therapy evaluations 16. Hypoalbuminemia  Supplement through tube feeds  LOS: 1 days A FACE TO FACE EVALUATION WAS PERFORMED  Shalice Woodring Lorie Phenix 12/23/2020, 10:13 AM

## 2020-12-23 NOTE — Progress Notes (Signed)
Inpatient Rehabilitation Care Coordinator Assessment and Plan Patient Details  Name: [name] Day MRN: [id number] Date of Birth: 02-10-1929  Today's Date: 12/23/2020  Hospital Problems: Principal Problem:   Cerebrovascular accident (CVA) due to occlusion of left middle cerebral artery ([place]) Active Problems:   Esophageal stricture   Protein-calorie malnutrition, severe   Gastrostomy tube in place [facility])   Right sided weakness   Stroke (cerebrum) [facility])  Past Medical History:  Past Medical History:  Diagnosis Date  . Anemia    IRON INFUSIONS  . Aortic valve sclerosis   . Arrhythmia   . Arthritis    osteoarthritis  . Basal cell carcinoma of skin   . Brain tumor ([place])   . Brain tumor ([place])   . Cervical spine disease   . CHF (congestive heart failure) ([place])   . Diabetes mellitus without complication ([place])   . Dysrhythmia    sinus arrhythmia  . Esophageal stricture    severe, led to feeding tube placement in Sept 2019  . Esophageal ulcer without bleeding   . Gastrostomy tube dependent ([place])    DOES NOT EAT OR DRINK   . GERD (gastroesophageal reflux disease)   . History of kidney stones   . Hyperlipemia   . Hypertension   . Kidney stones   . Leaky heart valve   . Meningioma ([place])   . Occlusive mesenteric ischemia ([place])   . Pulmonary hypertension ([place])   . RLS (restless legs syndrome)   . Vertigo    Past Surgical History:  Past Surgical History:  Procedure Laterality Date  . ABDOMINAL HYSTERECTOMY    . APPENDECTOMY    . ARTHROGRAM KNEE Left   . BACK SURGERY     CERVIVAL NECK FUSION  . CATARACT EXTRACTION W/PHACO Left 11/11/2018   Procedure: CATARACT EXTRACTION PHACO AND INTRAOCULAR LENS PLACEMENT (IOC) LEFT, DIABETIC;  Surgeon: Birder Robson, MD;  Location: [facility] ORS;  Service: Ophthalmology;  Laterality: Left;  [place]  00:41 CDE 6.41 Fluid pack lot # [id number]  . COLONOSCOPY WITH PROPOFOL N/A 06/20/2017   Procedure: COLONOSCOPY WITH PROPOFOL;  Surgeon: Lollie Sails, MD;  Location: [facility] ENDOSCOPY;  Service: Endoscopy;  Laterality: N/A;  . ESOPHAGOGASTRODUODENOSCOPY (EGD) WITH PROPOFOL N/A 03/14/2015   Procedure: ESOPHAGOGASTRODUODENOSCOPY (EGD) WITH PROPOFOL;  Surgeon: Josefine Class, MD;  Location: [facility] ENDOSCOPY;  Service: Endoscopy;  Laterality: N/A;  . ESOPHAGOGASTRODUODENOSCOPY (EGD) WITH PROPOFOL N/A 03/28/2017   Procedure: ESOPHAGOGASTRODUODENOSCOPY (EGD) WITH PROPOFOL;  Surgeon: Lollie Sails, MD;  Location: [facility] ENDOSCOPY;  Service: Endoscopy;  Laterality: N/A;  . ESOPHAGOGASTRODUODENOSCOPY (EGD) WITH PROPOFOL N/A 06/20/2017   Procedure: ESOPHAGOGASTRODUODENOSCOPY (EGD) WITH PROPOFOL;  Surgeon: Lollie Sails, MD;  Location: [facility] ENDOSCOPY;  Service: Endoscopy;  Laterality: N/A;  . ESOPHAGOGASTRODUODENOSCOPY (EGD) WITH PROPOFOL N/A 10/21/2017   Procedure: ESOPHAGOGASTRODUODENOSCOPY (EGD) WITH PROPOFOL;  Surgeon: Lollie Sails, MD;  Location: [facility] ENDOSCOPY;  Service: Endoscopy;  Laterality: N/A;  . ESOPHAGOGASTRODUODENOSCOPY (EGD) WITH PROPOFOL N/A 04/15/2018   Procedure: ESOPHAGOGASTRODUODENOSCOPY (EGD) WITH PROPOFOL;  Surgeon: Lollie Sails, MD;  Location: [facility] ENDOSCOPY;  Service: Endoscopy;  Laterality: N/A;  . ESOPHAGUS SURGERY     CLOSURE  . EYE SURGERY    . HYSTERECTOMY ABDOMINAL WITH SALPINGECTOMY    . IR GASTROSTOMY TUBE MOD SED  06/11/2018  . IR GASTROSTOMY TUBE REMOVAL  03/25/2019  . IR REPLACE G-TUBE SIMPLE WO FLUORO  10/19/2020  . PERIPHERAL VASCULAR CATHETERIZATION N/A 01/23/2016   Procedure: Visceral Venography;  Surgeon: Algernon Huxley, MD;  Location:  [place] CV LAB;  Service: Cardiovascular;  Laterality: N/A;  . PERIPHERAL VASCULAR CATHETERIZATION  01/23/2016   Procedure: Peripheral Vascular Intervention;  Surgeon: Algernon Huxley, MD;  Location: [place] CV LAB;  Service: Cardiovascular;;  . UPPER ESOPHAGEAL ENDOSCOPIC ULTRASOUND (EUS) N/A 12/05/2017   Procedure: UPPER ESOPHAGEAL ENDOSCOPIC ULTRASOUND  (EUS);  Surgeon: Jola Schmidt, MD;  Location: [facility] ENDOSCOPY;  Service: Endoscopy;  Laterality: N/A;  . VISCERAL ANGIOGRAPHY N/A 03/18/2017   Procedure: Visceral Angiography;  Surgeon: Algernon Huxley, MD;  Location: [place] CV LAB;  Service: Cardiovascular;  Laterality: N/A;  . VISCERAL ARTERY INTERVENTION N/A 03/18/2017   Procedure: Visceral Artery Intervention;  Surgeon: Algernon Huxley, MD;  Location: [place] CV LAB;  Service: Cardiovascular;  Laterality: N/A;   Social History:  reports that she has never smoked. She has never used smokeless tobacco. She reports that she does not drink alcohol and does not use drugs.  Family / Support Systems Marital Status: Widow/Widower Patient Roles: Parent Children: [name] Day-daughter [phone]-cell  [name] Day-daughter [phone]-cell-both local Other Supports: Royetta Car [phone] Lives in [place] Anticipated Caregiver: Two daughter's Ability/Limitations of Caregiver: One daughter subs in school and other retired. Both plan to assist if needed. Caregiver Availability: 24/7 (Depending upon Mom's care needs) Family Dynamics: Close knit family all three children are involved and supportive. Will provide whatever Mom needs, she would for them. Both children report Mom is very independent and wants to be this way again.  Social History Preferred language: English Religion: Baptist Cultural Background: No issues Education: HS Read: Yes Write: Yes Employment Status: Retired Public relations account executive Issues: No issues Guardian/Conservator: None-according to MD pt is capable of making her own decisions while here. Children plan to be here daily to provide support to Mom.   Abuse/Neglect Abuse/Neglect Assessment Can Be Completed: Yes Physical Abuse: Denies Verbal Abuse: Denies Sexual Abuse: Denies Exploitation of patient/patient's resources: Denies Self-Neglect: Denies  Emotional Status Pt's affect, behavior and adjustment  status: Pt has always been independent and taken care of herself with all of her health issues. She has adapted and was even doing her own tube feedings prior to admission. Daughter confirms she is one who does not want to rely upon others or burden them. Recent Psychosocial Issues: other health issues was still independent and driving prior to admission Psychiatric History: No history deferred depression screen still adjusting to the rehab unit. Seems to be coping appropriately but does have aphasia and has difficulty expressing herself. Will continue to evalute if neuro-psych needed. Substance Abuse History: No issues  Patient / Family Perceptions, Expectations & Goals Pt/Family understanding of illness & functional limitations: Pt and children are able to explain her stroke and issues as a result, all are hopeful she will do well here and make good progress and is able to communicate her needs before she leaves. Premorbid pt/family roles/activities: mom, grandmother, retiree, church member, etc Anticipated changes in roles/activities/participation: resume Pt/family expectations/goals: Pt nods but has speech issues from her CVA. Son states: " We will do what is needed."  Daughter states: " Me and my sister can provide 24/7 if needed."  [employer]: Other (Comment) (has HH in the past) Premorbid Home Care/DME Agencies: Other (Comment) (has cane. rollator and bsc) Transportation available at discharge: Pt drove prior to admission-daughter's will provide transport  Discharge Planning Living Arrangements: Alone Support Systems: Children,Church/faith community,Friends/neighbors Type of Residence: Private residence Insurance Resources: Multimedia programmer (specify) Forensic psychologist Medicare) Museum/gallery curator Resources: Radio broadcast assistant Screen  Referred: No Living Expenses: Own Money Management: Patient Does the patient have any problems obtaining your medications?: No Home  Management: Pt did herself Patient/Family Preliminary Plans: Return home with two daughter's assisting if needed. Both are local and retired, one does sub at school for additional income but can quit if needed. Aware team evaluating and setting goals. Made aware if still having speech issues will need 24/7 for safety at discharge. Care Coordinator Anticipated Follow Up Needs: HH/OP  Clinical Impression Pleasant very motivated patient who is willing to work to regain her independence, which is very important to her. Her three children are all involved and supportive. Two daughter's will be providing care at discharge and plan to be here daily to provide support. Will await team evaluations and work on discharge needs.  Elease Hashimoto 12/23/2020, 9:52 AM

## 2020-12-23 NOTE — Plan of Care (Signed)
  Problem: Consults Goal: RH STROKE PATIENT EDUCATION Description: See Patient Education module for education specifics  Outcome: Progressing   Problem: RH BLADDER ELIMINATION Goal: RH STG MANAGE BLADDER WITH ASSISTANCE Description: STG Manage Bladder With cues/reminders Assistance Outcome: Progressing   Problem: RH SAFETY Goal: RH STG ADHERE TO SAFETY PRECAUTIONS W/ASSISTANCE/DEVICE Description: STG Adhere to Safety Precautions With cues/reminders  Assistance/Device. Outcome: Progressing   Problem: RH KNOWLEDGE DEFICIT Goal: RH STG INCREASE KNOWLEDGE OF DIABETES Description: Patient and daughter will be able to manage DM with medications and dietary modifications using handouts and educational books independently Outcome: Progressing Goal: RH STG INCREASE KNOWLEDGE OF HYPERTENSION Description: Patient and daughter will be able to manage HTN with medications and dietary modifications using handouts and educational books independently Outcome: Progressing Goal: RH STG INCREASE KNOWLEGDE OF HYPERLIPIDEMIA Description: Patient and daughter will be able to manage HLD with medications and dietary modifications using handouts and educational books independently (patient allergic to statins) Outcome: Progressing Goal: RH STG INCREASE KNOWLEDGE OF STROKE PROPHYLAXIS Description: Patient and daughter will be able to manage secondary stroke risks /prophylaxis with medications and dietary modifications using handouts and educational books independently Outcome: Progressing

## 2020-12-24 DIAGNOSIS — D638 Anemia in other chronic diseases classified elsewhere: Secondary | ICD-10-CM

## 2020-12-24 DIAGNOSIS — E1165 Type 2 diabetes mellitus with hyperglycemia: Secondary | ICD-10-CM

## 2020-12-24 DIAGNOSIS — I10 Essential (primary) hypertension: Secondary | ICD-10-CM

## 2020-12-24 LAB — URINE CULTURE: Culture: >=100000 — AB

## 2020-12-24 LAB — GLUCOSE, CAPILLARY
Glucose-Capillary: 123 mg/dL — ABNORMAL HIGH (ref 70–99)
Glucose-Capillary: 131 mg/dL — ABNORMAL HIGH (ref 70–99)
Glucose-Capillary: 188 mg/dL — ABNORMAL HIGH (ref 70–99)
Glucose-Capillary: 132 mg/dL — ABNORMAL HIGH (ref 70–99)

## 2020-12-24 MED ORDER — KATE FARMS STANDARD 1.4 PO LIQD
325.0000 mL | Freq: Three times a day (TID) | ORAL | Status: DC
  Administered 2020-12-24 – 2020-12-28 (×14): 325 mL
  Filled 2020-12-24 (×14): qty 325

## 2020-12-24 NOTE — Progress Notes (Signed)
[place] PHYSICAL MEDICINE & REHABILITATION PROGRESS NOTE  Subjective/Complaints: Patient seen laying in bed this morning.  She states she slept well overnight.  She states she had a good first day of therapies yesterday.  She denies complaints.  She notes improvement in bowel movements.  ROS: Denies CP, SOB, N/V/D  Objective: Vital Signs: Blood pressure (!) 135/54, pulse 72, temperature 97.6 F (36.4 C), resp. rate 16, weight 53.8 kg, SpO2 98 %. No results found. Recent Labs    12/22/20 1502 12/23/20 0503  WBC 7.5 6.3  HGB 9.7* 9.3*  HCT 31.0* 30.0*  PLT 269 272   Recent Labs    12/22/20 1502 12/23/20 0503  NA 137 137  K 4.6 4.5  CL 103 104  CO2 27 27  GLUCOSE 277* 158*  BUN 47* 41*  CREATININE 1.05* 0.96  CALCIUM 8.1* 8.3*    Intake/Output Summary (Last 24 hours) at 12/24/2020 1340 Last data filed at 12/23/2020 1823 Gross per 24 hour  Intake 200 ml  Output --  Net 200 ml        Physical Exam: BP (!) 135/54 (BP Location: Right Arm)   Pulse 72   Temp 97.6 F (36.4 C)   Resp 16   Wt 53.8 kg   SpO2 98%   BMI 18.58 kg/m  Constitutional: No distress . Vital signs reviewed. HENT: Normocephalic.  Atraumatic. Eyes: EOMI. No discharge. Cardiovascular: No JVD.  RRR. Respiratory: Normal effort.  No stridor.  Bilateral clear to auscultation. GI: Non-distended.  BS +.  + PEG. Skin: Warm and dry.  Intact. Scattered ecchymosis Psych: Normal mood.  Normal behavior. Musc: No edema in extremities.  No tenderness in extremities. Neuro: Alert Sensation diminished to light touch right side Dysarthria, unchanged Mild expressive aphasia Motor: LUE/LLE: 5/5 proximal to distal RUE: 4+/5 proximal to distal, stable RLE: 4-/5 proximal distal  Assessment/Plan: 1. Functional deficits which require 3+ hours per day of interdisciplinary therapy in a comprehensive inpatient rehab setting.  Physiatrist is providing close team supervision and 24 hour management of active  medical problems listed below.  Physiatrist and rehab team continue to assess barriers to discharge/monitor patient progress toward functional and medical goals   Care Tool:  Bathing    Body parts bathed by patient: Right arm,Left arm,Chest,Abdomen,Front perineal area,Buttocks,Right upper leg,Left upper leg,Right lower leg,Left lower leg,Face         Bathing assist Assist Level: Contact Guard/Touching assist     Upper Body Dressing/Undressing Upper body dressing   What is the patient wearing?: Pull over shirt    Upper body assist Assist Level: Minimal Assistance - Patient > 75%    Lower Body Dressing/Undressing Lower body dressing      What is the patient wearing?: Underwear/pull up,Pants     Lower body assist Assist for lower body dressing: Moderate Assistance - Patient 50 - 74%     Toileting Toileting    Toileting assist Assist for toileting: Minimal Assistance - Patient > 75%     Transfers Chair/bed transfer  Transfers assist     Chair/bed transfer assist level: Contact Guard/Touching assist     Locomotion Ambulation   Ambulation assist   Ambulation activity did not occur: N/A  Assist level: Contact Guard/Touching assist Assistive device: Walker-rolling Max distance: 150   Walk 10 feet activity   Assist     Assist level: Contact Guard/Touching assist Assistive device: Walker-rolling   Walk 50 feet activity   Assist    Assist level: Contact Guard/Touching assist  Assistive device: Walker-rolling    Walk 150 feet activity   Assist    Assist level: Contact Guard/Touching assist Assistive device: Walker-rolling    Walk 10 feet on uneven surface  activity   Assist     Assist level: Contact Guard/Touching assist Assistive device: Aeronautical engineer Will patient use wheelchair at discharge?: No             Wheelchair 50 feet with 2 turns activity    Assist            Wheelchair 150  feet activity     Assist           Medical Problem List and Plan: 1.  Right hemiparesis as well as expressive aphasia secondary to L MCA stroke  Continue CIR 2.  Antithrombotics: -DVT/anticoagulation:  Mechanical: Sequential compression devices, below knee Bilateral lower extremities  Lovenox             -antiplatelet therapy: ASA/Plavix 3. Headaches/Pain Management: Tylenol prn  Controlled on 3/26  Monitor with increased exertion 4. Mood: LCSW to follow for evaluation and support.              -antipsychotic agents: N/A 5. Neuropsych: This patient is ?fully capable of making decisions on her own behalf. 6. Skin/Wound Care: Routine pressure relief measures.  7. Fluids/Electrolytes/Nutrition: PEG in place  Tube feeds qid with water flushes.             Tolerating "[employer]"--asked family to check into vegan supplemental feeds.  8. Acute on chronic systolic CHF: Compensated. Will monitor weight daily and for signs of overload            On Coreg bid, Lasix and ASA--no statin due to myalgias.             Continue to hold Lisinopril and Spironolactone.  Filed Weights   12/23/20 0353 12/24/20 0500  Weight: 54.8 kg 53.8 kg   Stable on 3/26 9. Pre-renal azotemia: BUN around 50 at baseline. Continue water flushes.             BUN/Cr 41/0.96 on 3/25  10. T2DM with hyperglycemia: Hgb A1c-6.9. Monitor BS ac/hs prior to tube feeds.   Continue SSI for elevated BS  Slightly elevated on 3/26  Monitor with increased mobility 11. HTN: Monitor BP tid--continue Coreg and Lasix.   Relatively controlled on 3/26  Monitor with increased mobility 12. Esophageal stricture/mesenteric ischemia:   On tube feeds qid--tolerating current formula without diarrhea.   G tube was replaced on 10/2020  13. UTI: On Rocephin D# 4.   Cultures showing Proteus mirabilis 14. Constipation:   Continue Senna S with miralax. 15.  Esophageal dysphagia  Cont PEG  See #12 16. Hypoalbuminemia  Supplement  through tube feeds 17.  Anemia of chronic disease  Hemoglobin 9.3 on 3/25, continue to monitor  LOS: 2 days A FACE TO FACE EVALUATION WAS PERFORMED  Lisandro Meggett Lorie Phenix 12/24/2020, 1:40 PM

## 2020-12-24 NOTE — Plan of Care (Signed)
  Problem: Consults Goal: RH STROKE PATIENT EDUCATION Description: See Patient Education module for education specifics  Outcome: Progressing   Problem: RH BLADDER ELIMINATION Goal: RH STG MANAGE BLADDER WITH ASSISTANCE Description: STG Manage Bladder With cues/reminders Assistance Outcome: Progressing   Problem: RH SAFETY Goal: RH STG ADHERE TO SAFETY PRECAUTIONS W/ASSISTANCE/DEVICE Description: STG Adhere to Safety Precautions With cues/reminders  Assistance/Device. Outcome: Progressing   Problem: RH KNOWLEDGE DEFICIT Goal: RH STG INCREASE KNOWLEDGE OF DIABETES Description: Patient and daughter will be able to manage DM with medications and dietary modifications using handouts and educational books independently Outcome: Progressing Goal: RH STG INCREASE KNOWLEDGE OF HYPERTENSION Description: Patient and daughter will be able to manage HTN with medications and dietary modifications using handouts and educational books independently Outcome: Progressing Goal: RH STG INCREASE KNOWLEGDE OF HYPERLIPIDEMIA Description: Patient and daughter will be able to manage HLD with medications and dietary modifications using handouts and educational books independently (patient allergic to statins) Outcome: Progressing Goal: RH STG INCREASE KNOWLEDGE OF STROKE PROPHYLAXIS Description: Patient and daughter will be able to manage secondary stroke risks /prophylaxis with medications and dietary modifications using handouts and educational books independently Outcome: Progressing

## 2020-12-25 ENCOUNTER — Inpatient Hospital Stay (HOSPITAL_COMMUNITY): Payer: Medicare Other

## 2020-12-25 LAB — GLUCOSE, CAPILLARY
Glucose-Capillary: 197 mg/dL — ABNORMAL HIGH (ref 70–99)
Glucose-Capillary: 134 mg/dL — ABNORMAL HIGH (ref 70–99)
Glucose-Capillary: 158 mg/dL — ABNORMAL HIGH (ref 70–99)
Glucose-Capillary: 179 mg/dL — ABNORMAL HIGH (ref 70–99)

## 2020-12-25 MED ORDER — POLYETHYLENE GLYCOL 3350 17 G PO PACK: 17.0000 g | PACK | Freq: Every day | ORAL | Status: DC | PRN

## 2020-12-25 MED ORDER — TRAZODONE HCL 50 MG PO TABS
25.0000 mg | ORAL_TABLET | Freq: Every evening | ORAL | Status: DC | PRN
  Administered 2020-12-29 – 2020-12-31 (×3): 50 mg
  Filled 2020-12-25 (×4): qty 1

## 2020-12-25 MED ORDER — DIPHENHYDRAMINE HCL 12.5 MG/5ML PO ELIX: 12.5000 mg | ORAL_SOLUTION | Freq: Four times a day (QID) | ORAL | Status: DC | PRN

## 2020-12-25 NOTE — Progress Notes (Signed)
Speech Language Pathology Daily Session Note  Patient Details  Name: TIMOTHY TOWNSEL Day MRN: [id number] Date of Birth: 19-Jan-1929  Today's Date: 12/25/2020 SLP Individual Time: 50-1530 SLP Individual Time Calculation (min): 55 min  Short Term Goals: Week 1: SLP Short Term Goal 1 (Week 1): Patient will imitate to produce 3-syllable words with 80% accuracy and minA cues SLP Short Term Goal 2 (Week 1): Patient will describe at 3-4 word phrase level with min-modA cues. SLP Short Term Goal 3 (Week 1): Patient will respond to basic level open-ended questions at 2-word phrase level with modA cues. SLP Short Term Goal 4 (Week 1): Patient will follow 3-step verbal commands/directions with minA cues. SLP Short Term Goal 5 (Week 1): Patient will initiate requests/comments to have needs/wants met with supervisionA.  Skilled Therapeutic Interventions:  Pt was seen for skilled ST targeting communication goals.  Upon arrival, pt was seated upright in bed, awake, alert, and agreeable to participating in treatment.  Pt's daughter and granddaughter were at bedside and remained engaged in all treatment tasks.  SLP initiated skilled education regarding compensatory strategies for word finding and speech intelligibility with appropriate handouts posted in pt's room to maximize carryover in between treatment sessions. Questions were answered to family's satisfaction at this time.  SLP facilitated the session with a verbal error awareness task to address goals for accurate verbal production.  Pt needed min assist verbal and visual cues to recognize and correct word level errors within simple sentences and then replace errors with appropriate words.  Within task, pt appeared to struggle most with motor planning errors which lead to imprecise and irregular production of words.  SLP introduced a more complex task given pt's rapid carryover of skills, during which pt required mod assist verbal cues to accurately produce words  or short phrases out of context.  Pt was left in bed with family at bedside.  Continue per current plan of care.     Pain Pain Assessment Pain Scale: 0-10 Pain Score: 0-No pain  Therapy/Group: Individual Therapy  Page, Selinda Orion 12/25/2020, 3:55 PM

## 2020-12-25 NOTE — Progress Notes (Signed)
Nurse tech alerted that IV pump was beeping. This nurse went to assess for her nurse. Infusion of antibiotics was finished & the pump had switched over to [facility]. When restarted, the patient stated that it was hurting & started crying. When the site was assess, it was red & the surrounding area was a little swollen. IV fluids were stopped & IV was removed. No further distress was noted. Her nurse was informed.

## 2020-12-25 NOTE — Progress Notes (Signed)
Pt's daughter called stating pt was having trouble breathing. Upon entry pt appeared uncomfortable and stated having pain pointing to upper abdominal area. Noted hiccup like/ twitching movements starting from upper abdomen and moving upward every 5-7 seconds. RR contacted to assist with assessment. Dr. Posey Pronto made aware of event. New orders obtained.   1050 Pt is currently at baseline, resting in bed. No twitching movement noted. Pt denies any trouble breathing at this time. Continue plan of care.   Gerald Stabs, RN

## 2020-12-25 NOTE — Progress Notes (Signed)
Occupational Therapy Session Note  Patient Details  Name: [name] Day MRN: [id number] Date of Birth: 27-Jul-1929  Today's Date: 12/25/2020 OT Individual Time: 9675-9163 OT Individual Time Calculation (min): 60 min   Short Term Goals: Week 1:  OT Short Term Goal 1 (Week 1): STGs = LTGs  Skilled Therapeutic Interventions/Progress Updates:    Pt greeted in bed with no c/o pain. She declined toileting and showering today, opting to instead start session by brushing her teeth. Supine<sit from elevated bed without bedrail, rising on the Rt side (per home setup) completed with setup assist. She then donned her bedroom shoes with setup assist and increased time. Min HHA to ambulate short distance to the sink, vcs for not furniture walking. Worked on Rt attention and Rt hand coordination while engaging in oral care, face washing, and handwashing while standing at the sink with CGA. Pt requiring cues for mindful attention to her Rt hand during these tasks. Note that she brought the toothbrush to her Rt cheek when attempting to bring it to her mouth using Rt hand, getting toothpaste on her face. Transitioned to IADL retraining after to work on dynamic standing balance, Rt attention and Rt UE NMR. Pt ambulating around bed with CGA, noted increased reliance on bed and baseboard for stability. Vcs and Min A to meet task demands with her Rt hand, difficult for her to grasp slicker fabric (I.e. sheets and pillowcases). Discussed with daughter, who was present, importance of safely involving pt with IADL activity at home for remediation of functional abilities (I.e. dynamic balance and Rt hand use). Pt sat to don pillowcases due to decreased standing endurance. Vcs once again for mindful attention to the Rt hand as she'd often lose grip on fabric without exhibiting awareness of it. Pt opted to remain sitting up at end of session, all needs within reach and chair alarm set.   Tried to find rollator during session  but out of supply at CIR. Asked daughter to bring in either rollator or 3 wheeled walker, which pt uses at home for mobility  Therapy Documentation Precautions:  Precautions Precautions: Fall Restrictions Weight Bearing Restrictions: No Other Position/Activity Restrictions: PEG Vital Signs: Therapy Vitals Temp: 97.7 F (36.5 C) Temp Source: Oral Pulse Rate: (!) 52 Resp: 17 BP: (!) 145/46 Patient Position (if appropriate): Lying Oxygen Therapy SpO2: 97 % O2 Device: Room Air ADL: ADL Eating: NPO Grooming: Supervision/safety Where Assessed-Grooming: Standing at sink Upper Body Bathing: Supervision/safety Where Assessed-Upper Body Bathing: Shower Lower Body Bathing: Contact guard Where Assessed-Lower Body Bathing: Shower Upper Body Dressing: Minimal assistance Where Assessed-Upper Body Dressing: Chair Lower Body Dressing: Moderate assistance Where Assessed-Lower Body Dressing: Chair Toileting: Minimal assistance Where Assessed-Toileting: Glass blower/designer: Psychiatric nurse Method: Counselling psychologist: Energy manager: Environmental education officer Method: Heritage manager: Transfer tub bench,Grab bars     Therapy/Group: Individual Therapy  Ernesha Ramone A Niaja Stickley 12/25/2020, 12:07 PM

## 2020-12-25 NOTE — Significant Event (Incomplete)
Rapid Response Event Note   Reason for Call :  Chest pain  Initial Focused Assessment:  Called by RN stating that her patient was complaining of some chest pain difficulty breathing.  The RN stated that the patient was also having strange hiccup-like movement without having the hiccups.  The patient identified the location of her discomfort being in general abdominal area.  She denies upper chest pain, radiation, jaw pain, new fatigue or other symptoms that could be concerning for MI. On      Interventions:    Plan of Care:     Event Summary:   MD Notified:  Call Time: Arrival Time: End Time:  Venetia Maxon, RN

## 2020-12-25 NOTE — Progress Notes (Signed)
Physical Therapy Session Note  Patient Details  Name: [name] Day MRN: [id number] Date of Birth: [birth year]  Today's Date: 12/25/2020 PT Individual Time: 1104-1200 PT Individual Time Calculation (min): 56 min   Short Term Goals: Week 1:  PT Short Term Goal 1 (Week 1): STGs=LTGs  Skilled Therapeutic Interventions/Progress Updates: Pt presents sitting in chair w/ family present and agreeable to therapy.  Pt donned shoes independently using Figure-4 position.  Pt amb w/ RW to w/c, verbal cues for safe approach and use of UEs for transition to sitting.  Pt wheeled to main gym for time and energy conservation.  Pt amb multiple trials during session w/ RW and CGA to supervision up to 150' including turns to return to seat.  Pt able to grip handle w/ R hand, although pt c/o "it is numb."  Pt requires occasional verbal cues for posture and for positioning inside RW, especially w/ turns.  Pt performed standing toe taps to 6" platform using RW and w/ HHA.  Pt performed standing hanging horseshoes 2 heights of basketball hoop, using B UEs simultaneously.  Increased challenge as task was performed standing on Airex cushioned surface.  Pt returned to room and amb back to chair, w/ daughter present.  Seat alarm on and all needs in reach.     Therapy Documentation Precautions:  Precautions Precautions: Fall Restrictions Weight Bearing Restrictions: No Other Position/Activity Restrictions: PEG General:   Vital Signs: Therapy Vitals Temp: 97.7 F (36.5 C) Temp Source: Oral Pulse Rate: (!) 52 Resp: 17 BP: (!) 145/46 Patient Position (if appropriate): Lying Oxygen Therapy SpO2: 97 % O2 Device: Room Air Pain: no c/o.       Therapy/Group: Individual Therapy  Ladoris Gene 12/25/2020, 12:31 PM

## 2020-12-26 LAB — CBC
HCT: 31.1 % — ABNORMAL LOW (ref 36.0–46.0)
Hemoglobin: 10.2 g/dL — ABNORMAL LOW (ref 12.0–15.0)
MCH: 29.4 pg (ref 26.0–34.0)
MCHC: 32.8 g/dL (ref 30.0–36.0)
MCV: 89.6 fL (ref 80.0–100.0)
Platelets: 290 10*3/uL (ref 150–400)
RBC: 3.47 MIL/uL — ABNORMAL LOW (ref 3.87–5.11)
RDW: 14.5 % (ref 11.5–15.5)
WBC: 6.5 10*3/uL (ref 4.0–10.5)
nRBC: 0 % (ref 0.0–0.2)

## 2020-12-26 LAB — BASIC METABOLIC PANEL
Anion gap: 7 (ref 5–15)
BUN: 25 mg/dL — ABNORMAL HIGH (ref 8–23)
CO2: 23 mmol/L (ref 22–32)
Calcium: 8.6 mg/dL — ABNORMAL LOW (ref 8.9–10.3)
Chloride: 108 mmol/L (ref 98–111)
Creatinine, Ser: 0.86 mg/dL (ref 0.44–1.00)
GFR, Estimated: >60 mL/min (ref >60)
Glucose, Bld: 202 mg/dL — ABNORMAL HIGH (ref 70–99)
Potassium: 4.6 mmol/L (ref 3.5–5.1)
Sodium: 138 mmol/L (ref 135–145)

## 2020-12-26 LAB — GLUCOSE, CAPILLARY
Glucose-Capillary: 133 mg/dL — ABNORMAL HIGH (ref 70–99)
Glucose-Capillary: 167 mg/dL — ABNORMAL HIGH (ref 70–99)
Glucose-Capillary: 146 mg/dL — ABNORMAL HIGH (ref 70–99)
Glucose-Capillary: 196 mg/dL — ABNORMAL HIGH (ref 70–99)

## 2020-12-26 MED ORDER — AMOXICILLIN 250 MG PO CAPS
500.0000 mg | ORAL_CAPSULE | Freq: Two times a day (BID) | ORAL | Status: AC
  Administered 2020-12-26 – 2020-12-27 (×3): 500 mg via ORAL
  Filled 2020-12-26 (×3): qty 2

## 2020-12-26 NOTE — Progress Notes (Signed)
Speech Language Pathology Daily Session Note  Patient Details  Name: [name] Day MRN: [id number] Date of Birth: 1928-10-13  Today's Date: 12/26/2020 SLP Individual Time: 0930-1000 SLP Individual Time Calculation (min): 30 min  Short Term Goals: Week 1: SLP Short Term Goal 1 (Week 1): Patient will imitate to produce 3-syllable words with 80% accuracy and minA cues SLP Short Term Goal 2 (Week 1): Patient will describe at 3-4 word phrase level with min-modA cues. SLP Short Term Goal 3 (Week 1): Patient will respond to basic level open-ended questions at 2-word phrase level with modA cues. SLP Short Term Goal 4 (Week 1): Patient will follow 3-step verbal commands/directions with minA cues. SLP Short Term Goal 5 (Week 1): Patient will initiate requests/comments to have needs/wants met with supervisionA.  Skilled Therapeutic Interventions:Skilled ST services focused on speech skills. SLP facilitated word finding and articulation in 3-4 word phrases created based on verb picture cards. Pt demonstrated increase awareness of articulation and word finding errors throughuot the session fading to supervision verbal cues. However to correct especially articulation errors pt required mod A verbal/visual cues. Pt required min A verbal cues for word finding at an average of 3 words in a phrase with min a verbal cues to slow rate.  Pt was left in room with daughter, call bell within reach and chair alarm set. SLP recommends to continue skilled services.     Pain Pain Assessment Pain Score: 0-No pain  Therapy/Group: Individual Therapy  [name]  [facility] 12/26/2020, 12:46 PM

## 2020-12-26 NOTE — Progress Notes (Signed)
[facility] PHYSICAL MEDICINE & REHABILITATION PROGRESS NOTE  Subjective/Complaints: Patient seen sitting up in bed this AM.  She states she slept well overnight.  She denies complaints.  Yesterday, pt noted to have "twitching" in her RUE ?progressing to her upper body.  This self-resolved.  Patient states she believes it was due to gas.   ROS: Denies CP, SOB, N/V/D  Objective: Vital Signs: Blood pressure (!) 168/64, pulse 70, temperature 98.1 F (36.7 C), temperature source Oral, resp. rate 20, weight 51.8 kg, SpO2 98 %. DG Abd 1 View  Result Date: 12/25/2020 CLINICAL DATA:  Abdominal pain. EXAM: ABDOMEN - 1 VIEW COMPARISON:  10/28/2018 CT FINDINGS: Bowel gas pattern is unremarkable. No dilated bowel loops are present. A gastrostomy tube is identified. LEFT nephrolithiasis again noted. A small LEFT pleural effusion and LEFT LOWER lung opacity noted. No acute bony abnormality noted. IMPRESSION: 1. Unremarkable bowel gas pattern. 2. Small LEFT pleural effusion and LEFT LOWER lung opacity/atelectasis. 3. LEFT nephrolithiasis Electronically Signed   By: Margarette Canada M.D.   On: 12/25/2020 15:38   Recent Labs    12/26/20 0714  WBC 6.5  HGB 10.2*  HCT 31.1*  PLT 290   Recent Labs    12/26/20 0714  NA 138  K 4.6  CL 108  CO2 23  GLUCOSE 202*  BUN 25*  CREATININE 0.86  CALCIUM 8.6*   No intake or output data in the 24 hours ending 12/26/20 1015   Pressure Injury 12/24/20 Sacrum Mid Stage 1 -  Intact skin with non-blanchable redness of a localized area usually over a bony prominence. (Active)  12/24/20 2126  Location: Sacrum  Location Orientation: Mid  Staging: Stage 1 -  Intact skin with non-blanchable redness of a localized area usually over a bony prominence.  Wound Description (Comments):   Present on Admission:     Physical Exam: BP (!) 168/64 (BP Location: Right Arm)   Pulse 70   Temp 98.1 F (36.7 C) (Oral)   Resp 20   Wt 51.8 kg   SpO2 98%   BMI 17.89 kg/m    Constitutional: No distress . Vital signs reviewed. HENT: Normocephalic.  Atraumatic. Eyes: EOMI. No discharge. Cardiovascular: No JVD.  RRR. Respiratory: Normal effort.  No stridor.  Bilateral clear to auscultation. GI: Non-distended.  BS +. +PEG. Skin: Warm and dry.  Intact. Scattered ecchymosis.  Psych: Normal mood.  Normal behavior. Musc: No edema in extremities.  No tenderness in extremities. Neuro: Alert Sensation diminished to light touch right side Dysarthria, stable Mild expressive aphasia, improving Motor: LUE/LLE: 5/5 proximal to distal RUE: 4+/5 proximal to distal, unchanged RLE: 4-/5 proximal distal  Assessment/Plan: 1. Functional deficits which require 3+ hours per day of interdisciplinary therapy in a comprehensive inpatient rehab setting.  Physiatrist is providing close team supervision and 24 hour management of active medical problems listed below.  Physiatrist and rehab team continue to assess barriers to discharge/monitor patient progress toward functional and medical goals   Care Tool:  Bathing    Body parts bathed by patient: Right arm,Left arm,Chest,Abdomen,Front perineal area,Buttocks,Right upper leg,Left upper leg,Right lower leg,Left lower leg,Face         Bathing assist Assist Level: Contact Guard/Touching assist     Upper Body Dressing/Undressing Upper body dressing   What is the patient wearing?: Pull over shirt    Upper body assist Assist Level: Supervision/Verbal cueing    Lower Body Dressing/Undressing Lower body dressing      What is the patient  wearing?: Pants,Incontinence brief     Lower body assist Assist for lower body dressing: Maximal Assistance - Patient 25 - 49%     Toileting Toileting    Toileting assist Assist for toileting: Contact Guard/Touching assist     Transfers Chair/bed transfer  Transfers assist     Chair/bed transfer assist level: Contact Guard/Touching assist      Locomotion Ambulation   Ambulation assist   Ambulation activity did not occur: N/A  Assist level: Contact Guard/Touching assist Assistive device: Walker-rolling Max distance: 150   Walk 10 feet activity   Assist     Assist level: Contact Guard/Touching assist Assistive device: Walker-rolling   Walk 50 feet activity   Assist    Assist level: Contact Guard/Touching assist Assistive device: Walker-rolling    Walk 150 feet activity   Assist    Assist level: Contact Guard/Touching assist Assistive device: Walker-rolling    Walk 10 feet on uneven surface  activity   Assist     Assist level: Contact Guard/Touching assist Assistive device: Aeronautical engineer Will patient use wheelchair at discharge?: No             Wheelchair 50 feet with 2 turns activity    Assist            Wheelchair 150 feet activity     Assist           Medical Problem List and Plan: 1.  Right hemiparesis as well as expressive aphasia secondary to L MCA stroke  Continue CIR 2.  Antithrombotics: -DVT/anticoagulation:  Mechanical: Sequential compression devices, below knee Bilateral lower extremities  Lovenox             -antiplatelet therapy: ASA/Plavix 3. Headaches/Pain Management: Tylenol prn  Controlled on 3/28  Monitor with increased exertion 4. Mood: LCSW to follow for evaluation and support.              -antipsychotic agents: N/A 5. Neuropsych: This patient is ?fully capable of making decisions on her own behalf. 6. Skin/Wound Care: Routine pressure relief measures.  7. Fluids/Electrolytes/Nutrition: PEG in place  Tube feeds qid with water flushes.             Tolerating "[employer]"--asked family to check into vegan supplemental feeds.  8. Acute on chronic systolic CHF: Compensated. Will monitor weight daily and for signs of overload            On Coreg bid, Lasix and ASA--no statin due to myalgias.             Continue  to hold Lisinopril and Spironolactone.  Filed Weights   12/24/20 0500 12/25/20 0500 12/26/20 0500  Weight: 53.8 kg 62.7 kg 51.8 kg   ?Reliability on 3/28 9. Pre-renal azotemia: BUN around 50 at baseline. Continue water flushes.             BUN/Cr improving on 3/28  10. T2DM with hyperglycemia: Hgb A1c-6.9. Monitor BS ac/hs prior to tube feeds.   Continue SSI for elevated BS  Slightly labile on 3/28  Monitor with increased mobility 11. HTN: Monitor BP tid--continue Coreg and Lasix.   Lisinopril 2.5 started on 3/28  Monitor with increased mobility 12. Esophageal stricture/mesenteric ischemia:   On tube feeds qid--tolerating current formula without diarrhea.   G tube was replaced on 10/2020  13. UTI: On Rocephin D# 5.   Cultures showing Proteus mirabilis 14. Constipation:   Continue Senna S with miralax. 15.  Esophageal dysphagia  Cont PEG  See #12 16. Hypoalbuminemia  Supplement through tube feeds 17.  Anemia of chronic disease  Hemoglobin 10.2 on 3/28, continue to monitor  LOS: 4 days A FACE TO FACE EVALUATION WAS PERFORMED  Ankit Lorie Phenix 12/26/2020, 10:15 AM

## 2020-12-26 NOTE — Progress Notes (Signed)
Physical Therapy Session Note  Patient Details  Name: [name] Day MRN: [id number] Date of Birth: 1929-05-29  Today's Date: 12/26/2020 PT Individual Time: 1300-1358 PT Individual Time Calculation (min): 58 min   Short Term Goals: Week 1:  PT Short Term Goal 1 (Week 1): STGs=LTGs  Skilled Therapeutic Interventions/Progress Updates:    Family member who is one of several intermittent caregivers, present in the room and initiated family education with her focusing on functional transfers, demonstration of car transfers, ramp negotiation, safety with use of rollator, fall risk, and functional gait with rollator. Pt performs transfers overall CGA to close supervision including toileting and functional dynamic standing balance for clothing management. Pt's personal rollator (without seat model, triangle shaped) used throughout session and discussed differences between types of AD and that patient has several options at home including regular RW, this rollator, regular rollator with seat, and w/c. Cues needed throughout session for focus on safe use of brakes and cues for attention to RUE placement on handles. In ADL apartment demonstrated furniture transfers to recliner, low couch, and regular bed with family member observing and being educated throughout session. Pt performed all with close supervision to CGA overall.   Dynamic gait and and balance training through obstacle course with rollator including stepping over bolster for increased challenge to single limb stance with assist for management of rollator during this task due to decrease RUE grip x 25' x 2 trials. NMR for RUE manipulation and strengthening/grip and dynamic standing balance while playing giant connect 4 game. Pt did need up to hand over hand assist to manage and maintain grip on RUE of the playing piece. Balance with CGA overall without AD.  Returned to room at end of session and request to return to bed with supervision.    Therapy Documentation Precautions:  Precautions Precautions: Fall Restrictions Weight Bearing Restrictions: No Other Position/Activity Restrictions: PEG Pain: No complaints of pain    Therapy/Group: Individual Therapy  Canary Brim Ivory Broad, PT, DPT, CBIS  12/26/2020, 3:09 PM

## 2020-12-26 NOTE — Progress Notes (Signed)
Pt is on D6 of ceftriaxone for proteus UTI. Ok to optimize to amoxicillin to complete 7d of therapy per Dr. Posey Pronto.  Onnie Boer, PharmD, [place], AAHIVP, CPP Infectious Disease Pharmacist 12/26/2020 10:50 AM  '

## 2020-12-26 NOTE — Progress Notes (Signed)
Speech Language Pathology Daily Session Note  Patient Details  Name: [name] Day MRN: [id number] Date of Birth: 1929-02-08  Today's Date: 12/26/2020 SLP Individual Time: 1230-1300 SLP Individual Time Calculation (min): 30 min  Short Term Goals: Week 1: SLP Short Term Goal 1 (Week 1): Patient will imitate to produce 3-syllable words with 80% accuracy and minA cues SLP Short Term Goal 2 (Week 1): Patient will describe at 3-4 word phrase level with min-modA cues. SLP Short Term Goal 3 (Week 1): Patient will respond to basic level open-ended questions at 2-word phrase level with modA cues. SLP Short Term Goal 4 (Week 1): Patient will follow 3-step verbal commands/directions with minA cues. SLP Short Term Goal 5 (Week 1): Patient will initiate requests/comments to have needs/wants met with supervisionA.  Skilled Therapeutic Interventions: Skilled SLP intervention focused on verbal expression. Pt formulated 3-4 word phrases to describe action photos with mod assistance. She increased accuracy for articulatory placement errors with visual cues. She responded to basic level open ended questions with with 2-3 word phrases and mod articulation errors but able to correct with visual cue. Educated daughter on strategies to increase patients speech intelligiblity. Pt left seated upright in bed with daughter present. Call button and phone within reach. Bed alarm set.Cont with therapy per plan of care.     Pain Pain Assessment Pain Scale: Faces Pain Score: 0-No pain Faces Pain Scale: No hurt  Therapy/Group: Individual Therapy  Darrol Poke Avel Ogawa 12/26/2020, 2:31 PM

## 2020-12-26 NOTE — Progress Notes (Signed)
Occupational Therapy Session Note  Patient Details  Name: [name] Day MRN: [id number] Date of Birth: 1929-03-10  Today's Date: 12/26/2020 OT Individual Time: 1115-1200 OT Individual Time Calculation (min): 45 min    Short Term Goals: Week 1:  OT Short Term Goal 1 (Week 1): STGs = LTGs  Skilled Therapeutic Interventions/Progress Updates:    Pt received in room dressed and ready for the day. She was communicating well in complete sentences which is enormous progress from last Friday. Pt agreeable to going to day room to work on [place] coordination. She used her 3 wheeled walker to walk over 150 ft to day room with light CGA.   In day room, pt worked on Paramedic blocks with needing to use visual attention to R hand as she has no sensation of that extremity.  Also worked with light dowel bar using hands together for symmetrical UE AROM movement patterns. Single arm on dowel to "stir the pot'.  Ambulated back to room with walker and washed her hands and face at the sink with cues to fully attend to R side of face.  Pt opted to lay down. Bed alarm set and all needs met. Daughter in room with pt.   Therapy Documentation Precautions:  Precautions Precautions: Fall Restrictions Weight Bearing Restrictions: No Other Position/Activity Restrictions: PEG  Pain: Pain Assessment Pain Score: 0-No pain ADL: ADL Eating: NPO Grooming: Supervision/safety Where Assessed-Grooming: Standing at sink Upper Body Bathing: Supervision/safety Where Assessed-Upper Body Bathing: Shower Lower Body Bathing: Contact guard Where Assessed-Lower Body Bathing: Shower Upper Body Dressing: Minimal assistance Where Assessed-Upper Body Dressing: Chair Lower Body Dressing: Moderate assistance Where Assessed-Lower Body Dressing: Chair Toileting: Minimal assistance Where Assessed-Toileting: Glass blower/designer: Psychiatric nurse Method: Information systems manager: Energy manager: Environmental education officer Method: Heritage manager: Transfer tub bench,Grab bars   Therapy/Group: Individual Therapy  [name] 12/26/2020, 12:44 PM

## 2020-12-27 DIAGNOSIS — K909 Intestinal malabsorption, unspecified: Secondary | ICD-10-CM

## 2020-12-27 DIAGNOSIS — R197 Diarrhea, unspecified: Secondary | ICD-10-CM

## 2020-12-27 LAB — GLUCOSE, CAPILLARY
Glucose-Capillary: 129 mg/dL — ABNORMAL HIGH (ref 70–99)
Glucose-Capillary: 115 mg/dL — ABNORMAL HIGH (ref 70–99)
Glucose-Capillary: 191 mg/dL — ABNORMAL HIGH (ref 70–99)
Glucose-Capillary: 154 mg/dL — ABNORMAL HIGH (ref 70–99)

## 2020-12-27 MED ORDER — HYDROCERIN EX CREA: 1.0000 "application " | TOPICAL_CREAM | Freq: Two times a day (BID) | CUTANEOUS | 0 refills | Status: AC

## 2020-12-27 MED ORDER — METFORMIN HCL 500 MG PO TABS
500.0000 mg | ORAL_TABLET | Freq: Every day | ORAL | Status: DC
  Administered 2020-12-28 – 2021-01-01 (×5): 500 mg via ORAL
  Filled 2020-12-27 (×5): qty 1

## 2020-12-27 NOTE — Progress Notes (Signed)
Physical Therapy Session Note  Patient Details  Name: [name] MRN: [id number] Date of Birth: 29-Jan-1929  Today's Date: 12/27/2020 PT Individual Time: 0800-0914 PT Individual Time Calculation (min): 74 min   Short Term Goals: Week 1:  PT Short Term Goal 1 (Week 1): STGs=LTGs  Skilled Therapeutic Interventions/Progress Updates:    Pt greeted supine in bed, daughter at bedside. Pt agreeable to PT session without reports of pain. Discussed with daughter and patient regarding DC planning and pt's progress with therapies. Pt reports one of her biggest difficulties is her R hand weakness/parethesis and how it frequently slips off of RW while ambulating. Stand<>pivot transfer with CGA and no AD from EOB to w/c and w/c transport to main rehab gym. Gait training 163ft with rollator + 165ft with rollator + 167ft with no AD + 142ft with loaded carry of 1kg med ball - all intervals requiring CGA (light minA with no AD and loaded carries). Therapist wrapped her R rollator handle with colored coban tape to improve RUE grip and proprioceptive feedback - no slipping of RUE off of grip but she does struggle with sometimes squeezing the brake while attempting to grip the handle. In ADL apartment room, complete furniture transfers with supervision to rollator. Instructed to locate 5 items in ADL kitchen working on item retrieval and short term memory into dual cog tasks. She was unable to recall any of the 5 items once we arrived in the kitchen and she required max cues for strategies on locating these items. Ambulated back to main rehab gym, ~134ft, with CGA and rollator. Completed NMR on blue airex foam pad, requiring minA for standing balance with feet apart - completed dual-cog task with serial casting from 20-0 and number counting from 0-10 (accuracy limiting) but increasing unsteadiness noted. Also completed feet together on blue airex pad with minA for balance but pt fearful of falling and deferring further  challenges. Stand<>pivot with CGA back to w/c from mat table and she was returned to her room with totalA in w/c. Stand<>pivot with CGA back to EOB and sit>supine with supervision with use of bed features. Remained semi-reclined in bed at end of session with needs in reach, bed alarm on.  Therapy Documentation Precautions:  Precautions Precautions: Fall Restrictions Weight Bearing Restrictions: No Other Position/Activity Restrictions: PEG General:    Therapy/Group: Individual Therapy  Skylee Baird P Babette Stum PT 12/27/2020, 7:51 AM

## 2020-12-27 NOTE — Discharge Instructions (Signed)
Inpatient Rehab Discharge Instructions  [name] Day Discharge date and time:    Activities/Precautions/ Functional Status: Activity: no lifting, driving, or strenuous exercise for till cleared by MD Diet:  Wound Care: none needed    Functional status:  ___ No restrictions     ___ Walk up steps independently ___ 24/7 supervision/assistance   ___ Walk up steps with assistance ___ Intermittent supervision/assistance  ___ Bathe/dress independently ___ Walk with walker     ___ Bathe/dress with assistance ___ Walk Independently    ___ Shower independently ___ Walk with assistance    ___ Shower with assistance ___ No alcohol     ___ Return to work/school ________  Special Instructions:  STROKE/TIA DISCHARGE INSTRUCTIONS SMOKING Cigarette smoking nearly doubles your risk of having a stroke & is the single most alterable risk factor  If you smoke or have smoked in the last 12 months, you are advised to quit smoking for your health.  Most of the excess cardiovascular risk related to smoking disappears within a year of stopping.  Ask you doctor about anti-smoking medications  New Home Quit Line: 1-800-QUIT NOW  Free Smoking Cessation Classes [phone]  CHOLESTEROL Know your levels; limit fat & cholesterol in your diet  Lipid Panel     Component Value Date/Time   CHOL 178 12/18/2020 0444   TRIG 51 12/18/2020 0444   HDL 56 12/18/2020 0444   CHOLHDL 3.2 12/18/2020 0444   VLDL 10 12/18/2020 0444   LDLCALC 112 (H) 12/18/2020 0444      Many patients benefit from treatment even if their cholesterol is at goal.  Goal: Total Cholesterol (CHOL) less than 160  Goal:  Triglycerides (TRIG) less than 150  Goal:  HDL greater than 40  Goal:  LDL (LDLCALC) less than 100   BLOOD PRESSURE American Stroke Association blood pressure target is less that 120/80 mm/Hg  Your discharge blood pressure is:  BP: 124/78  Monitor your blood pressure  Limit your salt and alcohol intake  Many  individuals will require more than one medication for high blood pressure  DIABETES (A1c is a blood sugar average for last 3 months) Goal HGBA1c is under 7% (HBGA1c is blood sugar average for last 3 months)  Diabetes:     Lab Results  Component Value Date   HGBA1C 6.9 (H) 12/18/2020     Your HGBA1c can be lowered with medications, healthy diet, and exercise.  Check your blood sugar as directed by your physician  Call your physician if you experience unexplained or low blood sugars.  PHYSICAL ACTIVITY/REHABILITATION Goal is 30 minutes at least 4 days per week  Activity: No driving, Therapies: see above Return to work: N/A  Activity decreases your risk of heart attack and stroke and makes your heart stronger.  It helps control your weight and blood pressure; helps you relax and can improve your mood.  Participate in a regular exercise program.  Talk with your doctor about the best form of exercise for you (dancing, walking, swimming, cycling).  DIET/WEIGHT Goal is to maintain a healthy weight  Your discharge diet is:  Diet Order            Diet - low sodium heart healthy                liquids Your height is:  Height: 5\' 7"  (170.2 cm) Your current weight is: Weight: 53.2 kg Your Body Mass Index (BMI) is:  BMI (Calculated): 18.37  Following the type of diet  specifically designed for you will help prevent another stroke.  You are below goal weight  Your goal Body Mass Index (BMI) is 19-24.  Healthy food habits can help reduce 3 risk factors for stroke:  High cholesterol, hypertension, and excess weight.  RESOURCES Stroke/Support Group:  Call [phone]   STROKE EDUCATION PROVIDED/REVIEWED AND GIVEN TO PATIENT Stroke warning signs and symptoms How to activate emergency medical system (call 911). Medications prescribed at discharge. Need for follow-up after discharge. Personal risk factors for stroke. Pneumonia vaccine given:  Flu vaccine given:  My questions have  been answered, the writing is legible, and I understand these instructions.  I will adhere to these goals & educational materials that have been provided to me after my discharge from the hospital.     My questions have been answered and I understand these instructions. I will adhere to these goals and the provided educational materials after my discharge from the hospital.  Patient/Caregiver Signature _______________________________ Date __________  Clinician Signature _______________________________________ Date __________  Please bring this form and your medication list with you to all your follow-up doctor's appointments.

## 2020-12-27 NOTE — Progress Notes (Signed)
Occupational Therapy Session Note  Patient Details  Name: [name] Day MRN: [id number] Date of Birth: 10/07/1928  Today's Date: 12/27/2020 OT Individual Time: 1100-1200 OT Individual Time Calculation (min): 60 min    Short Term Goals: Week 1:  OT Short Term Goal 1 (Week 1): STGs = LTGs  Skilled Therapeutic Interventions/Progress Updates:    Pt received coming out of the bathroom with the NT.  She washed her hands at the sink and then ambulated to therapy room with her 3 wheeled walker. This session focused on RUE NMR with sensory stimulation using a variety of resistive and wt loaded activities. She did extremely well with the large GM movement patterns but continues to have difficulty with [facility].  Worked on 1 inch pegs removing from board and tip pinch on foam block. She has great difficulty with tip pinch due to decreased sensory awareness.  Ambulated back to room and opted to sit in recliner.  Chair alarm on, call light in reach, all needs met.  Therapy Documentation Precautions:  Precautions Precautions: Fall Restrictions Weight Bearing Restrictions: No Other Position/Activity Restrictions: PEG  Pain: Pain Assessment Pain Score: 0-No pain ADL: ADL Eating: NPO Grooming: Supervision/safety Where Assessed-Grooming: Standing at sink Upper Body Bathing: Supervision/safety Where Assessed-Upper Body Bathing: Shower Lower Body Bathing: Contact guard Where Assessed-Lower Body Bathing: Shower Upper Body Dressing: Minimal assistance Where Assessed-Upper Body Dressing: Chair Lower Body Dressing: Moderate assistance Where Assessed-Lower Body Dressing: Chair Toileting: Minimal assistance Where Assessed-Toileting: Glass blower/designer: Psychiatric nurse Method: Counselling psychologist: Energy manager: Environmental education officer Method: Heritage manager: Transfer tub bench,Grab  bars   Therapy/Group: Individual Therapy  [place] 12/27/2020, 12:27 PM

## 2020-12-27 NOTE — Progress Notes (Signed)
Occupational Therapy Session Note  Patient Details  Name: [name] Day MRN: [id number] Date of Birth: 09/14/1929  Today's Date: 12/27/2020 OT Individual Time: 1300-1345 OT Individual Time Calculation (min): 45 min    Short Term Goals: Week 1:  OT Short Term Goal 1 (Week 1): STGs = LTGs  Skilled Therapeutic Interventions/Progress Updates:    Pt received supine with her daughter present, no c/o pain. Pt agreeable to remain in room for therapy session. Pt completed bed mobility to EOB with CGA. Session focused on RUE NMR. Pt completed functional reaching with resistive clothespins to increase grip strength, hand prehension and functional supination/pronation. Pt completed [place] activity, attempting to thread beads through string, requiring mod HOH facilitation d/t difficulty with sensation and visually attending to task, dropping bead often. Discussed prior performance of pt administering her own tube feeds. Pt completed pouring activity to simulate this task, trialing several different containers and grip options to maximize independence. Pt able to be min A by end of activity with mod cueing and assist with hand prehension. Pt required cueing/education throughout session for force modulation of RUE d/t frequent over exertion. Pt was left supine with all needs met, bed alarm set.   Therapy Documentation Precautions:  Precautions Precautions: Fall Restrictions Weight Bearing Restrictions: No Other Position/Activity Restrictions: PEG  Therapy/Group: Individual Therapy  Curtis Sites 12/27/2020, 10:52 AM

## 2020-12-27 NOTE — Progress Notes (Signed)
[place] PHYSICAL MEDICINE & REHABILITATION PROGRESS NOTE  Subjective/Complaints: Patient seen sitting up in bed this morning, family at bedside.  She states she slept fairly overnight, as result of being in the hospital.  She notes diarrhea with one BM this a.m.  She notes resolution of "twitching".  She notes improvement in speech.  She notes improving sensation as well  ROS: + Diarrhea.  Denies CP, SOB, N/V.  Objective: Vital Signs: Blood pressure (!) 160/55, pulse 64, temperature (!) 97.5 F (36.4 C), temperature source Oral, resp. rate 20, weight 54.8 kg, SpO2 99 %. DG Abd 1 View  Result Date: 12/25/2020 CLINICAL DATA:  Abdominal pain. EXAM: ABDOMEN - 1 VIEW COMPARISON:  10/28/2018 CT FINDINGS: Bowel gas pattern is unremarkable. No dilated bowel loops are present. A gastrostomy tube is identified. LEFT nephrolithiasis again noted. A small LEFT pleural effusion and LEFT LOWER lung opacity noted. No acute bony abnormality noted. IMPRESSION: 1. Unremarkable bowel gas pattern. 2. Small LEFT pleural effusion and LEFT LOWER lung opacity/atelectasis. 3. LEFT nephrolithiasis Electronically Signed   By: Margarette Canada M.D.   On: 12/25/2020 15:38   Recent Labs    12/26/20 0714  WBC 6.5  HGB 10.2*  HCT 31.1*  PLT 290   Recent Labs    12/26/20 0714  NA 138  K 4.6  CL 108  CO2 23  GLUCOSE 202*  BUN 25*  CREATININE 0.86  CALCIUM 8.6*   No intake or output data in the 24 hours ending 12/27/20 1224   Pressure Injury 12/24/20 Sacrum Mid Stage 1 -  Intact skin with non-blanchable redness of a localized area usually over a bony prominence. (Active)  12/24/20 2126  Location: Sacrum  Location Orientation: Mid  Staging: Stage 1 -  Intact skin with non-blanchable redness of a localized area usually over a bony prominence.  Wound Description (Comments):   Present on Admission:     Physical Exam: BP (!) 160/55 (BP Location: Right Arm)   Pulse 64   Temp (!) 97.5 F (36.4 C) (Oral)    Resp 20   Wt 54.8 kg   SpO2 99%   BMI 18.92 kg/m   Constitutional: No distress . Vital signs reviewed. HENT: Normocephalic.  Atraumatic. Eyes: EOMI. No discharge. Cardiovascular: No JVD.  RRR. Respiratory: Normal effort.  No stridor.  Bilateral clear to auscultation. GI: Non-distended.  BS +.  + PEG. Skin: Warm and dry.  Intact. Scattered ecchymosis Psych: Normal mood.  Normal behavior. Musc: No edema in extremities.  No tenderness in extremities. Neuro: Alert Sensation diminished to light touch right side, improving Dysarthria, improving Mild expressive aphasia, improving Motor: LUE/LLE: 5/5 proximal to distal RUE: 4+/5 proximal to distal, unchanged RLE: 4-/5 proximal distal  Assessment/Plan: 1. Functional deficits which require 3+ hours per day of interdisciplinary therapy in a comprehensive inpatient rehab setting.  Physiatrist is providing close team supervision and 24 hour management of active medical problems listed below.  Physiatrist and rehab team continue to assess barriers to discharge/monitor patient progress toward functional and medical goals   Care Tool:  Bathing    Body parts bathed by patient: Right arm,Left arm,Chest,Abdomen,Front perineal area,Buttocks,Right upper leg,Left upper leg,Right lower leg,Left lower leg,Face         Bathing assist Assist Level: Contact Guard/Touching assist     Upper Body Dressing/Undressing Upper body dressing   What is the patient wearing?: Pull over shirt    Upper body assist Assist Level: Supervision/Verbal cueing    Lower Body Dressing/Undressing  Lower body dressing      What is the patient wearing?: Pants,Incontinence brief     Lower body assist Assist for lower body dressing: Maximal Assistance - Patient 25 - 49%     Toileting Toileting    Toileting assist Assist for toileting: Contact Guard/Touching assist     Transfers Chair/bed transfer  Transfers assist     Chair/bed transfer assist level:  Contact Guard/Touching assist     Locomotion Ambulation   Ambulation assist   Ambulation activity did not occur: N/A  Assist level: Contact Guard/Touching assist Assistive device: Rollator Max distance: 50'   Walk 10 feet activity   Assist     Assist level: Contact Guard/Touching assist Assistive device: Rollator   Walk 50 feet activity   Assist    Assist level: Contact Guard/Touching assist Assistive device: Rollator    Walk 150 feet activity   Assist    Assist level: Contact Guard/Touching assist Assistive device: Walker-rolling    Walk 10 feet on uneven surface  activity   Assist     Assist level: Contact Guard/Touching assist Assistive device: Rollator   Wheelchair     Assist Will patient use wheelchair at discharge?: No             Wheelchair 50 feet with 2 turns activity    Assist            Wheelchair 150 feet activity     Assist           Medical Problem List and Plan: 1.  Right hemiparesis as well as expressive aphasia secondary to L MCA stroke  Continue CIR 2.  Antithrombotics: -DVT/anticoagulation:  Mechanical: Sequential compression devices, below knee Bilateral lower extremities  Lovenox             -antiplatelet therapy: ASA/Plavix 3. Headaches/Pain Management: Tylenol prn  Controlled on 3/29  Monitor with increased exertion 4. Mood: LCSW to follow for evaluation and support.              -antipsychotic agents: N/A 5. Neuropsych: This patient is ?fully capable of making decisions on her own behalf. 6. Skin/Wound Care: Routine pressure relief measures.  7. Fluids/Electrolytes/Nutrition: PEG in place  Tube feeds qid with water flushes.             Tolerating "[employer]"--asked family to check into vegan supplemental feeds.  8. Acute on chronic systolic CHF: Compensated. Will monitor weight daily and for signs of overload            On Coreg bid, Lasix and ASA--no statin due to myalgias.              Continue to hold Lisinopril and Spironolactone.  Filed Weights   12/25/20 0500 12/26/20 0500 12/27/20 0500  Weight: 62.7 kg 51.8 kg 54.8 kg   ?Reliability on 3/29 9. Pre-renal azotemia: BUN around 50 at baseline. Continue water flushes.             BUN/Cr improving on 3/28  10.  Tube feed induced hypoglycemia on T2DM with hyperglycemia: Hgb A1c-6.9. Monitor BS ac/hs prior to tube feeds.   Continue SSI for elevated BS  Metformin started on 3/30  Remains elevated on 3/29  Monitor with increased mobility 11. HTN: Monitor BP tid--continue Coreg and Lasix.   Lisinopril 2.5 started on 3/28  Monitor with increased mobility 12. Esophageal stricture/mesenteric ischemia:   On tube feeds qid--tolerating current formula without diarrhea.   G tube was replaced on 10/2020  13. UTI: Rocephin transition to amoxicillin until 3/30  Cultures showing Proteus mirabilis 14. Constipation:   Continue Senna S with miralax. 15.  Esophageal dysphagia  Cont PEG  See #12 16. Hypoalbuminemia  Supplement through tube feeds 17.  Anemia of chronic disease  Hemoglobin 10.2 on 3/28, continue to monitor 18.  Diarrhea  Bowel meds DC'd on 3/29  LOS: 5 days A FACE TO FACE EVALUATION WAS PERFORMED  [place] Lorie Phenix 12/27/2020, 12:24 PM

## 2020-12-27 NOTE — Progress Notes (Signed)
Speech Language Pathology Daily Session Note  Patient Details  Name: [name] Day MRN: [id number] Date of Birth: 11-03-1928  Today's Date: 12/27/2020 SLP Individual Time: 1200-1230 SLP Individual Time Calculation (min): 30 min  Short Term Goals: Week 1: SLP Short Term Goal 1 (Week 1): Patient will imitate to produce 3-syllable words with 80% accuracy and minA cues SLP Short Term Goal 2 (Week 1): Patient will describe at 3-4 word phrase level with min-modA cues. SLP Short Term Goal 3 (Week 1): Patient will respond to basic level open-ended questions at 2-word phrase level with modA cues. SLP Short Term Goal 4 (Week 1): Patient will follow 3-step verbal commands/directions with minA cues. SLP Short Term Goal 5 (Week 1): Patient will initiate requests/comments to have needs/wants met with supervisionA.  Skilled Therapeutic Interventions:Skilled ST services focused on speech skills. SLP facilitated production of 2-3 syllable words. Pt demonstrated 90% accuracy producing 2 syllable words and 70% accuracy in 3 syllable words with mod A visual/verbal cues. Pt responded to open ended questions, initially repeating SLP's question then after initial cue could responded in 1-3 words with mod A verbal cues to correct articulation errors. Pt completed automatic language tasks, counting 1-10, days of the week with 90% accuracy and months of the year with errors only in 3 syllable words. Pt was left in room with call bell within reach and chair alarm set. SLP recommends to continue skilled services.     Pain Pain Assessment Pain Score: 0-No pain  Therapy/Group: Individual Therapy  Beatriz Settles  [facility] 12/27/2020, 12:42 PM

## 2020-12-27 NOTE — Progress Notes (Signed)
Patient ID: [name] Day, female   DOB: [birth year], 87 y.o.   MRN: [id number] Follow up visit with patient and grand-daughter regarding questions/concerns. Continue to follow along to discharge to address questions and educational needs. [name]

## 2020-12-28 LAB — GLUCOSE, CAPILLARY
Glucose-Capillary: 137 mg/dL — ABNORMAL HIGH (ref 70–99)
Glucose-Capillary: 146 mg/dL — ABNORMAL HIGH (ref 70–99)
Glucose-Capillary: 123 mg/dL — ABNORMAL HIGH (ref 70–99)

## 2020-12-28 MED ORDER — KATE FARMS STANDARD 1.4 PO LIQD
325.0000 mL | Freq: Four times a day (QID) | ORAL | Status: DC
  Administered 2020-12-28 – 2021-01-01 (×15): 325 mL
  Filled 2020-12-28 (×4): qty 325
  Filled 2020-12-28: qty 1950
  Filled 2020-12-28 (×13): qty 325

## 2020-12-28 NOTE — Progress Notes (Signed)
Occupational Therapy Session Note  Patient Details  Name: [name] MRN: [id number] Date of Birth: Jul 24, 1929  Today's Date: 12/28/2020 OT Individual Time: 1015-1100 OT Individual Time Calculation (min): 45 min    Short Term Goals: Week 1:  OT Short Term Goal 1 (Week 1): STGs = LTGs  Skilled Therapeutic Interventions/Progress Updates:    Pt received in bed with her dtr in the room with her. Pt very anxious to get to the bathroom. Using her 3 wheeled walker, pt ambulated with close S to the bathroom.  Managed clothing down and needed CGA to pull pants over R side of hip. She was able to do 80% of it, just needed min A to completely adjust underwear.  Pt walked to sink to wash hands and fix her hair.  Then sat in wc. Discussed her tub set up at home. Her dtr drew a diagram of the bathroom and told me that she has a standard tub bench that her husband had used.  Took pt to tub room to set up room to simulate home environment. Pt practiced tub transfers 2x and did so with only supervision.  Pt taken back to room and opted to sit in arm chair.  Discussed with dtr, the pt's limited R hand [place] due to sensory loss and how she will likely need A with medication management and tube feeds.  Provided pt with soft theraputty. She practiced tip pinch and lateral pinch on putty.    Set up in arm chair with all needs met.     Therapy Documentation Precautions:  Precautions Precautions: Fall Restrictions Weight Bearing Restrictions: No Other Position/Activity Restrictions: PEG  Pain: Pain Assessment Pain Score: 0-No pain ADL: ADL Eating: NPO Grooming: Supervision/safety Where Assessed-Grooming: Standing at sink Upper Body Bathing: Supervision/safety Where Assessed-Upper Body Bathing: Shower Lower Body Bathing: Contact guard Where Assessed-Lower Body Bathing: Shower Upper Body Dressing: Minimal assistance Where Assessed-Upper Body Dressing: Chair Lower Body Dressing: Minimal  assistance Where Assessed-Lower Body Dressing: Chair Toileting: Contact guard Where Assessed-Toileting: Glass blower/designer: Close supervision Armed forces technical officer Method: Counselling psychologist: Emergency planning/management officer Transfer: Environmental education officer Method: Heritage manager: Transfer tub bench,Grab bars   Therapy/Group: Individual Therapy  Valary Manahan 12/28/2020, 12:55 PM

## 2020-12-28 NOTE — Progress Notes (Signed)
Patient ID: INICE SANLUIS Day, female   DOB: 02/28/1929, 87 y.o.   MRN: [id number] Met with pt and daughter who is in the room to update regarding team conference goals supervision level and target discharge date 4/3. Both pleased with her progress and discharge date. Between both daughter's they will provide 24/7 supervision. They prefer [facility] for home health since has had them before. Referral made to them and they have accepted. Pt has all needed equipment from previous surgeries. Will continue to work on discharge needs. Daughter's at bedside daily and observe pt in therapies.

## 2020-12-28 NOTE — Progress Notes (Signed)
Speech Language Pathology Daily Session Note  Patient Details  Name: [name] MRN: [id number] Date of Birth: 03/24/1929  Today's Date: 12/28/2020 SLP Individual Time: 1345-1425 SLP Individual Time Calculation (min): 40 min  Short Term Goals: Week 1: SLP Short Term Goal 1 (Week 1): Patient will imitate to produce 3-syllable words with 80% accuracy and minA cues SLP Short Term Goal 2 (Week 1): Patient will describe at 3-4 word phrase level with min-modA cues. SLP Short Term Goal 3 (Week 1): Patient will respond to basic level open-ended questions at 2-word phrase level with modA cues. SLP Short Term Goal 4 (Week 1): Patient will follow 3-step verbal commands/directions with minA cues. SLP Short Term Goal 5 (Week 1): Patient will initiate requests/comments to have needs/wants met with supervisionA.  Skilled Therapeutic Interventions: Pt was seen for skilled ST targeting speech and language goals. Her daughter was present for session. SLP facilitated session with a picture scene description task, followed by a task in which she named objects within real life photos and then described similarities and differences between them. During both tasks, pt required Moderate verbal cues for correcting distorted sounds (due to dysarthria), as well as correcting phonemic paraphasias. Overall Min A semantic and sentence completion cues provided for word finding as well. Pt left sitting upright in bed with alarm set and needs within reach, daughter still present at bedside. Continue per current plan of care.          Pain Pain Assessment Pain Scale: Faces Faces Pain Scale: No hurt  Therapy/Group: Individual Therapy  Erin E Smith 12/28/2020, 7:15 AM   

## 2020-12-28 NOTE — Patient Care Conference (Signed)
Inpatient RehabilitationTeam Conference and Plan of Care Update Date: 12/28/2020   Time: 11:00 AM    Patient Name: [name] Day      Medical Record Number: [id number]  Date of Birth: 03/13/1929 Sex: Female         Room/Bed: 4W01C/4W01C-01 Payor Info: Payor: [place] / Plan: BCBS MEDICARE / Product Type: *No Product type* /    Admit Date/Time:  12/22/2020 12:59 PM  Primary Diagnosis:  Cerebrovascular accident (CVA) due to occlusion of left middle cerebral artery [facility])  Hospital Problems: Principal Problem:   Cerebrovascular accident (CVA) due to occlusion of left middle cerebral artery ([place]) Active Problems:   Esophageal stricture   Protein-calorie malnutrition, severe   Gastrostomy tube in place [facility])   Right sided weakness   Stroke (cerebrum) ([place])   Hypoalbuminemia due to protein-calorie malnutrition ([place])   Acute lower UTI   Anemia of chronic disease   Essential hypertension   Controlled type 2 diabetes mellitus with hyperglycemia, without long-term current use of insulin ([place])   Diarrhea due to malabsorption    Expected Discharge Date: Expected Discharge Date: 01/01/21  Team Members Present: Physician leading conference: Dr. Delice Lesch Care Coodinator Present: Dorien Chihuahua, RN, BSN, CRRN;Becky Dupree, LCSW Nurse Present: Dorien Chihuahua, RN PT Present: Ginnie Smart, PT OT Present: Meriel Pica, OT SLP Present: Jettie Booze, CF-SLP PPS Coordinator present : Ileana Ladd, PT     Current Status/Progress Goal Weekly Team Focus  Bowel/Bladder   continent of bowel and bladder loose stools r/t feeds LBM 03/29  remain contient of bowel and bladder  toilet prn   Swallow/Nutrition/ Hydration             ADL's   min A overall with self care due to RUE sensory loss and incoordination  supervision  ADL training, RUE NMR, pt/fam ed   Mobility   supervision bed mobility, supervision transfers, CGA gait with rollator  mod I  balance training, R  hemibody NMR, pt/family ed, gait training   Communication   Mod A, articulation and word finding errors at phrase/simple sentence level  Min A  articulation drills, 3 syllable words, 3-4 word phrases and speech intelligibility strategies   Safety/Cognition/ Behavioral Observations  Min A  Supervision A  following basic to 3 step commands   Pain   denies  free of pain  assess pain qshift medicate as ordered   Skin   bruises on bilateral arms Stage one to coccyx foam applied bilateral heels boggy heel protector foam applied  skin will remain intact no further breakdown improvement of coccyx  assess skin qshift     Discharge Planning:  HOme with two daughter's providing supervision. Both have been here and are very involved in her care   Team Discussion: Speech improving, HA controlled, MD added new med for HTN control and bowel management . ABX nearly completed for UTI. Patient on target to meet rehab goals: yes, good progress noted. Currently supervision for OT and PT and right side sensation issues affect fine motor coordination. Supervision - Mod I goals set for discharge. Min assist goals set for SLP.  *See Care Plan and progress notes for long and short-term goals.   Revisions to Treatment Plan:    Teaching Needs: Transfers, toileting, medications, etc.  Current Barriers to Discharge: Decreased caregiver support  Possible Resolutions to Barriers: [place] follow up services Family education     Medical Summary Current Status: Right hemiparesis as well as expressive aphasia  secondary to L MCA stroke  Barriers to Discharge: Medical stability;Other (comments);Nutrition means  Barriers to Discharge Comments: PEG Possible Resolutions to Barriers/Weekly Focus: Therapies, follow weights, optimize DM/HTN meds, abx for UTI, d/ced bowel meds   Continued Need for Acute Rehabilitation Level of Care: The patient requires daily medical management by a physician with specialized training in  physical medicine and rehabilitation for the following reasons: Direction of a multidisciplinary physical rehabilitation program to maximize functional independence : Yes Medical management of patient stability for increased activity during participation in an intensive rehabilitation regime.: Yes Analysis of laboratory values and/or radiology reports with any subsequent need for medication adjustment and/or medical intervention. : Yes   I attest that I was present, lead the team conference, and concur with the assessment and plan of the team.   [name] 12/28/2020, 3:34 PM

## 2020-12-28 NOTE — Progress Notes (Addendum)
PHYSICAL MEDICINE & REHABILITATION PROGRESS NOTE  Subjective/Complaints: Patient seen sitting up in bed this morning.  Family at bedside.  She states he slept well overnight.  She notes she continues to have diarrhea.  ROS: + Diarrhea, unchanged.  Denies CP, SOB, N/V.  Objective: Vital Signs: Blood pressure (!) 170/54, pulse 67, temperature 98.1 F (36.7 C), temperature source Oral, resp. rate 18, weight 54.1 kg, SpO2 100 %. No results found. Recent Labs    12/26/20 0714  WBC 6.5  HGB 10.2*  HCT 31.1*  PLT 290   Recent Labs    12/26/20 0714  NA 138  K 4.6  CL 108  CO2 23  GLUCOSE 202*  BUN 25*  CREATININE 0.86  CALCIUM 8.6*    Intake/Output Summary (Last 24 hours) at 12/28/2020 1028 Last data filed at 12/28/2020 0263 Gross per 24 hour  Intake 247 ml  Output --  Net 247 ml     Pressure Injury 12/24/20 Sacrum Mid Stage 1 -  Intact skin with non-blanchable redness of a localized area usually over a bony prominence. (Active)  12/24/20 2126  Location: Sacrum  Location Orientation: Mid  Staging: Stage 1 -  Intact skin with non-blanchable redness of a localized area usually over a bony prominence.  Wound Description (Comments):   Present on Admission:     Physical Exam: BP (!) 170/54 (BP Location: Right Arm)   Pulse 67   Temp 98.1 F (36.7 C) (Oral)   Resp 18   Wt 54.1 kg   SpO2 100%   BMI 18.68 kg/m   Constitutional: No distress . Vital signs reviewed. HENT: Normocephalic.  Atraumatic. Eyes: EOMI. No discharge. Cardiovascular: No JVD.  RRR. Respiratory: Normal effort.  No stridor.  Bilateral clear to auscultation. GI: Non-distended.  BS +.  + PEG. Skin: Warm and dry.  Intact. Scattered ecchymosis. Psych: Normal mood.  Normal behavior. Musc: No edema in extremities.  No tenderness in extremities. Neuro: Alert Sensation diminished to light touch right side, improving Dysarthria, improving Mild expressive aphasia, improving Motor: LUE/LLE:  5/5 proximal to distal RUE: 4+/5 proximal to distal, unchanged RLE: 4+-5/5 proximal distal  Assessment/Plan: 1. Functional deficits which require 3+ hours per day of interdisciplinary therapy in a comprehensive inpatient rehab setting.  Physiatrist is providing close team supervision and 24 hour management of active medical problems listed below.  Physiatrist and rehab team continue to assess barriers to discharge/monitor patient progress toward functional and medical goals   Care Tool:  Bathing    Body parts bathed by patient: Right arm,Left arm,Chest,Abdomen,Front perineal area,Buttocks,Right upper leg,Left upper leg,Right lower leg,Left lower leg,Face         Bathing assist Assist Level: Contact Guard/Touching assist     Upper Body Dressing/Undressing Upper body dressing   What is the patient wearing?: Pull over shirt    Upper body assist Assist Level: Supervision/Verbal cueing    Lower Body Dressing/Undressing Lower body dressing      What is the patient wearing?: Pants,Incontinence brief     Lower body assist Assist for lower body dressing: Maximal Assistance - Patient 25 - 49%     Toileting Toileting    Toileting assist Assist for toileting: Contact Guard/Touching assist     Transfers Chair/bed transfer  Transfers assist     Chair/bed transfer assist level: Contact Guard/Touching assist     Locomotion Ambulation   Ambulation assist   Ambulation activity did not occur: N/A  Assist level: Contact Guard/Touching assist Assistive device:  Rollator Max distance: 50'   Walk 10 feet activity   Assist     Assist level: Contact Guard/Touching assist Assistive device: Rollator   Walk 50 feet activity   Assist    Assist level: Contact Guard/Touching assist Assistive device: Rollator    Walk 150 feet activity   Assist    Assist level: Contact Guard/Touching assist Assistive device: Walker-rolling    Walk 10 feet on uneven surface   activity   Assist     Assist level: Contact Guard/Touching assist Assistive device: Rollator   Wheelchair     Assist Will patient use wheelchair at discharge?: No             Wheelchair 50 feet with 2 turns activity    Assist            Wheelchair 150 feet activity     Assist           Medical Problem List and Plan: 1.  Right hemiparesis as well as expressive aphasia secondary to L MCA stroke  Continue CIR  Team conference today to discuss current and goals and coordination of care, home and environmental barriers, and discharge planning with nursing, case manager, and therapies. Please see conference note from today as well.  2.  Antithrombotics: -DVT/anticoagulation:  Mechanical: Sequential compression devices, below knee Bilateral lower extremities  Lovenox             -antiplatelet therapy: ASA/Plavix 3. Headaches/Pain Management: Tylenol prn  Controlled on 3/30  Monitor with increased exertion 4. Mood: LCSW to follow for evaluation and support.              -antipsychotic agents: N/A 5. Neuropsych: This patient is ?fully capable of making decisions on her own behalf. 6. Skin/Wound Care: Routine pressure relief measures.  7. Fluids/Electrolytes/Nutrition: PEG in place  Tube feeds qid with water flushes.             Tolerating "[employer]"--asked family to check into vegan supplemental feeds.  8. Acute on chronic systolic CHF: Compensated. Will monitor weight daily and for signs of overload            On Coreg bid, Lasix and ASA--no statin due to myalgias.             Continue to hold Lisinopril and Spironolactone.  Filed Weights   12/26/20 0500 12/27/20 0500 12/28/20 0345  Weight: 51.8 kg 54.8 kg 54.1 kg   Stable on 3/30 9. Pre-renal azotemia: BUN around 50 at baseline. Continue water flushes.             BUN/Cr improving on 3/28  10.  Tube feed induced hyperglycemia on T2DM with hyperglycemia: Hgb A1c-6.9. Monitor BS ac/hs prior to tube  feeds.   Continue SSI for elevated BS  Metformin started on 3/30  Remains elevated on 3/30, would not make changes today given recent adjustment  Monitor with increased mobility 11. HTN: Monitor BP tid--continue Coreg and Lasix.   Lisinopril 2.5 started on 3/28  Elevated,?  Improving on 3/30  Monitor with increased mobility 12. Esophageal stricture/mesenteric ischemia:   On tube feeds qid--tolerating current formula without diarrhea.   G tube was replaced on 10/2020  13. UTI: Rocephin transition to amoxicillin until 3/30  Cultures showing Proteus mirabilis 14. Constipation:   Continue Senna S with miralax. 15.  Esophageal dysphagia  Cont PEG  See #12 16. Hypoalbuminemia  Supplement through tube feeds 17.  Anemia of chronic disease  Hemoglobin 10.2 on  3/28, continue to monitor 18.  Diarrhea  Bowel meds DC'd on 3/29  Continue to monitor  LOS: 6 days A FACE TO FACE EVALUATION WAS PERFORMED  Trevone Prestwood Lorie Phenix 12/28/2020, 10:28 AM

## 2020-12-28 NOTE — Plan of Care (Signed)
  Problem: RH Balance Goal: LTG Patient will maintain dynamic standing balance (PT) Description: LTG:  Patient will maintain dynamic standing balance with assistance during mobility activities (PT) Flowsheets (Taken 12/28/2020 1400) LTG: Pt will maintain dynamic standing balance during mobility activities with:: Supervision/Verbal cueing Note: Downgraded due to safety concerns   Problem: RH Ambulation Goal: LTG Patient will ambulate in controlled environment (PT) Description: LTG: Patient will ambulate in a controlled environment, # of feet with assistance (PT). Flowsheets (Taken 12/28/2020 1400) LTG: Pt will ambulate in controlled environ  assist needed:: Supervision/Verbal cueing Note: Downgraded due to safety concerns Goal: LTG Patient will ambulate in home environment (PT) Description: LTG: Patient will ambulate in home environment, # of feet with assistance (PT). Flowsheets (Taken 12/28/2020 1400) LTG: Pt will ambulate in home environ  assist needed:: Supervision/Verbal cueing Note: Downgraded due to safety concerns

## 2020-12-28 NOTE — Progress Notes (Signed)
Nutrition Follow-up  DOCUMENTATION CODES:   Non-severe (moderate) malnutrition in context of chronic illness  INTERVENTION:   Continue bolus tube feeds via PEG: - 1 carton (325 ml) Dillard [employer] Standard 1.4 formula QID with free water flush of 50 ml before and after each bolus - Additional free water flushes of 100 ml QID  Tube feeding regimen and free water flushes provide 1820 kcal, 80 grams of protein, and 1736 ml of H2O.  NUTRITION DIAGNOSIS:   Moderate Malnutrition related to chronic illness (esophageal stricture) as evidenced by moderate fat depletion,moderate muscle depletion.  New diagnosis after completion of NFPE  GOAL:   Patient will meet greater than or equal to 90% of their needs  Met via TF  MONITOR:   Labs,Weight trends,TF tolerance,I & O's  REASON FOR ASSESSMENT:   New TF    ASSESSMENT:   87 year old female with PMH of T2DM, CHF, PAD, HLD, HTN, CHF, meningioma, DDD of cervical spine s/p decompression, mesenteric ischemia with dumping syndrome (diarrhea after tube feeding boluses), esophageal stricture who is tube feeding dependent. Pt admitted to [facility] on 12/17/20 with confusion, weakness, and difficulty speaking. Pt found to have L MCA stroke. Admitted to CIR on 3/24.  Noted bowel medications discontinued yesterday after pt with an episode of diarrhea. Noted pt with episodes of diarrhea again this morning.  Spoke with pt and daughter at bedside. Pt reports Dillard [employer] tube feeding boluses are going well. She states that she feels full after a bolus but not overly full. She is amenable to adding one additional tube feeding bolus on in the evenings given malnutrition and new stage I pressure injury.  Pt believes that recent diarrhea (yesterday and today) was related to bowel regimen. MD has now discontinued bowel regimen.  Pt denies any N/V at this time. Pt reports UBW as 115 lbs.  CIR admit weight: 54.8 kg Current weight: 54.1 kg  Medications reviewed  and include: lasix, SSI TID, metformin, IV protonix  Labs reviewed. CBG's: 115-191 x 24 hours  NUTRITION - FOCUSED PHYSICAL EXAM:  Flowsheet Row Most Recent Value  Orbital Region Moderate depletion  Upper Arm Region Moderate depletion  Thoracic and Lumbar Region Mild depletion  Buccal Region Mild depletion  Temple Region Moderate depletion  Clavicle Bone Region Moderate depletion  Clavicle and Acromion Bone Region Moderate depletion  Scapular Bone Region Mild depletion  Dorsal Hand Mild depletion  Patellar Region Mild depletion  Anterior Thigh Region Moderate depletion  Posterior Calf Region Moderate depletion  Edema (RD Assessment) None  Hair Reviewed  Eyes Reviewed  Mouth Reviewed  Skin Reviewed  Nails Reviewed       Diet Order:   Diet Order            Diet NPO time specified  Diet effective now                 EDUCATION NEEDS:   No education needs have been identified at this time  Skin:  Skin Assessment: Skin Integrity Issues: Stage I: sacrum  Last BM:  12/28/20 type 7  Height:   Ht Readings from Last 1 Encounters:  12/17/20 $RemoveB'5\' 7"'[id number]$  (1.702 m)    Weight:   Wt Readings from Last 1 Encounters:  12/28/20 54.1 kg    BMI:  Body mass index is 18.68 kg/m.  Estimated Nutritional Needs:   Kcal:  1550-1750  Protein:  70-85 grams  Fluid:  1.5-1.7 L    Gustavus Bryant, MS, RD, LDN Inpatient Clinical Dietitian Please  see AMiON for contact information.

## 2020-12-28 NOTE — Progress Notes (Signed)
Physical Therapy Session Note  Patient Details  Name: [name] Day MRN: [id number] Date of Birth: December 02, 1928  Today's Date: 12/28/2020 PT Individual Time: 0800-0900 + 1132-1157 PT Individual Time Calculation (min): 60 min  + 25 min  Short Term Goals: Week 1:  PT Short Term Goal 1 (Week 1): STGs=LTGs  Skilled Therapeutic Interventions/Progress Updates:     1st session: Pt received supine in bed, eldest daughter [name], at bedside. Pt agreeable to therapy with no reports of pain. Daughter with questions regarding DC planning, DME rec's, follow up therapies, etc. All questions and concerns addressed. Reached out to [facility] and OT as well for these concerns via secure chat. Educated daughter and pt on her current mobility status, current barriers to DC, and expected LOS. Daughter reports family will be arranging 24/7 supervision but they want to make sure she can perform ADL and self care tasks sufficiently. Supine<>sit with supervision with use of bed features. Pt in her night gown, requesting to change into her regular day clothes. Removed gown with supervision and cues for technique, extra time needed. Donned shirt with setupA and pants with setupA as well. Sit<>stand with supervision to rollator and she required CGA for balance while she pulled pants over hips. Ambulated to her bathroom in her room with supervision and rollator as pt requesting to have BM. Pt continent of B + B, charted in flowsheets. Ambulated sinkside where she perform hand hygiene with CGA for balance. Ambulated from her room to main rehab gym, ~159ft, with supervision and rollator - cues for proximity to rollator and tucking elbows in and shldr depression, no LOB or knee buckling. Performed functional reaching task in standing with CGA for balance, instructed to use her RUE to work on FMC/dexterity as well, placing resistive clothespins onto finger ladder - needing mod cues for technique and emphasizing functional supination to  achieve correct positioning of clothespin. She then performed repeated sit<>stands with unweighted ball, cues for "squeezing" ball with BUE's to reinforce RUE proprioceptive feedback. Ambulated back to her room with supervision and rollator, ~188ft. Completed sit>supine mod I with hospital bed features. Remained in bed at end of session with bed alarm on, daughter at bedside, needs within reach.  2nd session: Pt received sitting in chair with daughter at bedside, pt agreeable to therapy and no reports of pain. Updated pt and daughter on conference and discharge date - but agreeable and excited. Sit<>stand with supervision to rollator - does a great job of making sure brakes are always locked during transitions. Sometimes she needs cues for making sure she doesn't grasp both brake AND handle as she is unaware that she will be engaging brake with her RUE while attempting to ambulate. Ambulated from her room to day room gym, ~274ft, with supervision and rollator - occasional cues for maintaining proximity to rollator. Completed dyanmic balance task while unsupported, needing CGA for steadying, tossing bean bags into corn hold board with her RUE for FMC/proprioception. Pt fairly consistent with tosses but accurate with 5/20 bags onto the board. Pt enjoyed this activity and improved her overall participation. Ambulated back to her room with supervision and rollator, requesting to lay in bed. Completed bed mobility mod I with bed features. Remained supine in bed with bed alarm on, needs in reach, pt made comfortable.   Therapy Documentation Precautions:  Precautions Precautions: Fall Restrictions Weight Bearing Restrictions: No Other Position/Activity Restrictions: PEG General:     Therapy/Group: Individual Therapy  Meher Kucinski P Lamel Mccarley PT 12/28/2020, 7:39 AM

## 2020-12-29 DIAGNOSIS — L8990 Pressure ulcer of unspecified site, unspecified stage: Secondary | ICD-10-CM | POA: Insufficient documentation

## 2020-12-29 DIAGNOSIS — E440 Moderate protein-calorie malnutrition: Secondary | ICD-10-CM | POA: Insufficient documentation

## 2020-12-29 LAB — GLUCOSE, CAPILLARY
Glucose-Capillary: 137 mg/dL — ABNORMAL HIGH (ref 70–99)
Glucose-Capillary: 169 mg/dL — ABNORMAL HIGH (ref 70–99)
Glucose-Capillary: 130 mg/dL — ABNORMAL HIGH (ref 70–99)
Glucose-Capillary: 140 mg/dL — ABNORMAL HIGH (ref 70–99)

## 2020-12-29 MED ORDER — LISINOPRIL 2.5 MG PO TABS
2.5000 mg | ORAL_TABLET | Freq: Every day | ORAL | Status: DC
  Administered 2020-12-29 – 2021-01-01 (×4): 2.5 mg via ORAL
  Filled 2020-12-29 (×4): qty 1

## 2020-12-29 NOTE — Progress Notes (Signed)
Speech Language Pathology Daily Session Note  Patient Details  Name: [name] Day MRN: [id number] Date of Birth: 01-May-1929  Today's Date: 12/29/2020 SLP Individual Time: 1030-1055 SLP Individual Time Calculation (min): 25 min  Short Term Goals: Week 1: SLP Short Term Goal 1 (Week 1): Patient will imitate to produce 3-syllable words with 80% accuracy and minA cues SLP Short Term Goal 2 (Week 1): Patient will describe at 3-4 word phrase level with min-modA cues. SLP Short Term Goal 3 (Week 1): Patient will respond to basic level open-ended questions at 2-word phrase level with modA cues. SLP Short Term Goal 4 (Week 1): Patient will follow 3-step verbal commands/directions with minA cues. SLP Short Term Goal 5 (Week 1): Patient will initiate requests/comments to have needs/wants met with supervisionA.  Skilled Therapeutic Interventions: Pt was seen for skilled ST targeting speech goals. Her daughter was present for session. Pt requested to use restroom upon arrival, therefore Supervision A verbal cues provided for safety with use of walker when ambulating to and from toilet. She was continent of urine. SLP facilitated rest of session with paragraph reading tasks. Pt was <30% accurate with articulation during paragraph reading task, and required Moderate cues to slow down and focus on one word at a time to increase ineligibility and reduce phonemic paraphasias. Of note, pt with lowest accuracy with consonant blends, as well as any words (regardless of placement) containing, /l/, /r/, or /p/. Discussed practicing at word and phrase levels when reading in comparison to paragraph at this time, given level of difficulty task presented today. Pt left sitting in wheelchair with alarm set and needs within reach, daughter still present at bedside. Continue per current plan of care.         Pain Pain Assessment Pain Scale: 0-10 Pain Score: 0-No pain  Therapy/Group: Individual Therapy  Arbutus Leas 12/29/2020, 7:23 AM

## 2020-12-29 NOTE — Progress Notes (Signed)
Occupational Therapy Session Note  Patient Details  Name: [name] Day MRN: [id number] Date of Birth: [birth year]  Today's Date: 12/29/2020 OT Individual Time: 7530-0511 OT Individual Time Calculation (min): 44 min    Short Term Goals: Week 1:  OT Short Term Goal 1 (Week 1): STGs = LTGs  Skilled Therapeutic Interventions/Progress Updates:    Pt received in bed with daughter present, agreeable to therapy. Completed bed mobility and ambulatory toilet transfer with RW with close S. Cont void of urine and completed toileting tasks with close S. Req increased time to pull up pants with R hand 2/2 sensory loss. Washed hands at sink close S, donned shoes seated in w/c with set-up A. W/c transport to gym to focus on R hand NMR, grip strength, and FMC in prep for improved bimanual ADL/IADL performance. Assessed B grip strength with the following results: R average: 12 lbs, L average: 20 lbs. Additionally complete the 9HPT with the following results: R: able to remove 4 pegs in 3 min; L: 35 seconds. Pt practiced placing and removing levels 1 through 4 resistive clothes pins with 3 jaw chuck grasp for levels 1/2, req modified palmar grasp for levels 3/4. Additionally, attempted palm <> digit translation task with poker chips, but pt unable to complete at this time. Reviewed theraputty exercises to complete at home. W/c transfer back to room and ambulatory transfer back to bed with close S.   Pt left in bed with daughter present, bed alarm engaged, call bell in reach, and all immediate needs met.    Therapy Documentation Precautions:  Precautions Precautions: Fall Restrictions Weight Bearing Restrictions: No Other Position/Activity Restrictions: PEG Pain: Pain Assessment Pain Scale: 0-10 Pain Score: 0-No pain ADL: See Care Tool for more details.   Therapy/Group: Individual Therapy  Volanda Napoleon MS, OTR/L  12/29/2020, 12:54 PM

## 2020-12-29 NOTE — Progress Notes (Signed)
[place] PHYSICAL MEDICINE & REHABILITATION PROGRESS NOTE  Subjective/Complaints: Patient seen sitting up in bed this morning.  She states she slept fairly overnight due to people coming in and out of her room.  Family at bedside.  She notes improvement in diarrhea.  She is excited to hear about her discharge date.  ROS: Denies CP, SOB, N/V/D.  Objective: Vital Signs: Blood pressure (!) 165/65, pulse 66, temperature 97.6 F (36.4 C), temperature source Oral, resp. rate 16, weight 51.4 kg, SpO2 99 %. No results found. No results for input(s): WBC, HGB, HCT, PLT in the last 72 hours. No results for input(s): NA, K, CL, CO2, GLUCOSE, BUN, CREATININE, CALCIUM in the last 72 hours.  Intake/Output Summary (Last 24 hours) at 12/29/2020 0954 Last data filed at 12/29/2020 0700 Gross per 24 hour  Intake 0 ml  Output --  Net 0 ml     Pressure Injury 12/24/20 Sacrum Mid Stage 1 -  Intact skin with non-blanchable redness of a localized area usually over a bony prominence. (Active)  12/24/20 2126  Location: Sacrum  Location Orientation: Mid  Staging: Stage 1 -  Intact skin with non-blanchable redness of a localized area usually over a bony prominence.  Wound Description (Comments):   Present on Admission:     Physical Exam: BP (!) 165/65   Pulse 66   Temp 97.6 F (36.4 C) (Oral)   Resp 16   Wt 51.4 kg   SpO2 99%   BMI 17.75 kg/m   Constitutional: No distress . Vital signs reviewed. HENT: Normocephalic.  Atraumatic. Eyes: EOMI. No discharge. Cardiovascular: No JVD.  RRR. Respiratory: Normal effort.  No stridor.  Bilateral clear to auscultation. GI: Non-distended.  BS +.  + PEG. Skin: Warm and dry.  Intact. Scattered ecchymosis. Psych: Normal mood.  Normal behavior. Musc: No edema in extremities.  No tenderness in extremities. Neuro: Alert Sensation diminished to light touch right side Dysarthria, improving Mild expressive aphasia, improving Motor: LUE/LLE: 5/5 proximal to  distal RUE: 4+/5 proximal to distal, stable RLE: 4+-5/5 proximal distal  Assessment/Plan: 1. Functional deficits which require 3+ hours per day of interdisciplinary therapy in a comprehensive inpatient rehab setting.  Physiatrist is providing close team supervision and 24 hour management of active medical problems listed below.  Physiatrist and rehab team continue to assess barriers to discharge/monitor patient progress toward functional and medical goals   Care Tool:  Bathing    Body parts bathed by patient: Right arm,Left arm,Chest,Abdomen,Front perineal area,Buttocks,Right upper leg,Left upper leg,Right lower leg,Left lower leg,Face         Bathing assist Assist Level: Contact Guard/Touching assist     Upper Body Dressing/Undressing Upper body dressing   What is the patient wearing?: Pull over shirt    Upper body assist Assist Level: Supervision/Verbal cueing    Lower Body Dressing/Undressing Lower body dressing      What is the patient wearing?: Pants,Incontinence brief     Lower body assist Assist for lower body dressing: Maximal Assistance - Patient 25 - 49%     Toileting Toileting    Toileting assist Assist for toileting: Contact Guard/Touching assist     Transfers Chair/bed transfer  Transfers assist     Chair/bed transfer assist level: Supervision/Verbal cueing     Locomotion Ambulation   Ambulation assist   Ambulation activity did not occur: N/A  Assist level: Contact Guard/Touching assist Assistive device: Rollator Max distance: 50'   Walk 10 feet activity   Assist  Assist level: Contact Guard/Touching assist Assistive device: Rollator   Walk 50 feet activity   Assist    Assist level: Contact Guard/Touching assist Assistive device: Rollator    Walk 150 feet activity   Assist    Assist level: Contact Guard/Touching assist Assistive device: Walker-rolling    Walk 10 feet on uneven surface  activity   Assist      Assist level: Contact Guard/Touching assist Assistive device: Rollator   Wheelchair     Assist Will patient use wheelchair at discharge?: No             Wheelchair 50 feet with 2 turns activity    Assist            Wheelchair 150 feet activity     Assist           Medical Problem List and Plan: 1.  Right hemiparesis as well as expressive aphasia secondary to L MCA stroke  Continue CIR 2.  Antithrombotics: -DVT/anticoagulation:  Mechanical: Sequential compression devices, below knee Bilateral lower extremities  Lovenox             -antiplatelet therapy: ASA/Plavix 3. Headaches/Pain Management: Tylenol prn  Controlled on 3/31  Monitor with increased exertion 4. Mood: LCSW to follow for evaluation and support.              -antipsychotic agents: N/A 5. Neuropsych: This patient is ?fully capable of making decisions on her own behalf. 6. Skin/Wound Care: Routine pressure relief measures.  7. Fluids/Electrolytes/Nutrition: PEG in place  Tube feeds qid with water flushes.             Tolerating "[employer]"--asked family to check into vegan supplemental feeds.  8. Acute on chronic systolic CHF: Compensated. Will monitor weight daily and for signs of overload            On Coreg bid, Lasix and ASA--no statin due to myalgias.             Continue to hold Lisinopril and Spironolactone.  Filed Weights   12/27/20 0500 12/28/20 0345 12/29/20 0338  Weight: 54.8 kg 54.1 kg 51.4 kg   ?  Reliability on 3/31 9. Pre-renal azotemia: BUN around 50 at baseline. Continue water flushes.             BUN/Cr improving on 3/28  10.  Tube feed induced hyperglycemia on T2DM with hyperglycemia: Hgb A1c-6.9. Monitor BS ac/hs prior to tube feeds.   Continue SSI for elevated BS  Metformin started on 3/30  Remains elevated on 3/31, would not make changes today given recent adjustment  Monitor with increased mobility 11. HTN: Monitor BP tid--continue Coreg and Lasix.    Lisinopril 2.5 started on 3/31  Monitor with increased mobility 12. Esophageal stricture/mesenteric ischemia:   On tube feeds qid--tolerating current formula without diarrhea.   G tube was replaced on 10/2020  13. UTI: Rocephin transition to amoxicillin completed on 3/30  Cultures showing Proteus mirabilis 14. Constipation:   Continue Senna S with miralax. 15.  Esophageal dysphagia  Cont PEG  See #12 16. Hypoalbuminemia  Supplement through tube feeds 17.  Anemia of chronic disease  Hemoglobin 10.2 on 3/28, continue to monitor 18.  Diarrhea  Bowel meds DC'd on 3/29  Continue to monitor  Improving  LOS: 7 days A FACE TO FACE EVALUATION WAS PERFORMED  Dolly Harbach Lorie Phenix 12/29/2020, 9:54 AM

## 2020-12-29 NOTE — Progress Notes (Signed)
Physical Therapy Session Note  Patient Details  Name: [name] Day MRN: [id number] Date of Birth: 07-Oct-1928  Today's Date: 12/29/2020 PT Individual Time: 1102-1156 PT Individual Time Calculation (min): 54 min   Short Term Goals: Week 1:  PT Short Term Goal 1 (Week 1): STGs=LTGs  Skilled Therapeutic Interventions/Progress Updates:    Pt greeted seated in w/c, daughter at bedside. Pt agreeable to therapy without reports of pain. Reports improved R hand sensation since yesterday. Lengthy discussion regarding DC planning, home safety training, role of f/u therapies, DME rec's, etc. Daughter also requesting to speak to CSW regarding these topics for further clarification - CSW notified. Sit<>stand with supervision to her rollator - cues needed for monitoring her RUE grip placement to avoid grabbing/engaging the brake. Ambulated from her room to ortho gym, >443ft, with supervision and rollator. Seated rest break before performing car transfer with supervision and rollator, car height set to simulate her [place], not much difficulty managing this. Then ambulated to ADL apartment room with supervion and rollator, ~163ft. Completed bed mobility on regular height bed with supervision and furniture transfers from sofa couch with supervision to rollator. Ambulated to main rehab gym, ~160ft, with supervision and rollator. NMR with standing balance - CGA needed for stability while performing alternating toe taps onto colored number pads - cues for verbalizing out loud the number/color, working on aphasia and dual cog tasks. She then performed repeated sit<>stands with unweighted ball (ball with large dice in it), instructed to perform a sit<>stand for # she rolled. She did this several times to fatigue. Ambulated back to her room with supervision and rollator - requested to return to bed. Sit>supine mod I with bed features. She remained supine in bed with bed alarm on and needs within reach.  Therapy  Documentation Precautions:  Precautions Precautions: Fall Restrictions Weight Bearing Restrictions: No Other Position/Activity Restrictions: PEG General:    Therapy/Group: Individual Therapy  Astryd Pearcy P [place] PT 12/29/2020, 7:42 AM

## 2020-12-29 NOTE — Progress Notes (Signed)
Inpatient Rehabilitation Care Coordinator Discharge Note  The overall goal for the admission was met for: DC Sunday 4/3  Discharge location: Yes-HOME WITH TWO DAUGHTER'S PROVIDING 24/7 SUPERVISION LEVEL  Length of Stay: Yes-10 DAYS  Discharge activity level: Yes-SUPERVISION LEVEL  Home/community participation: Yes  Services provided included: MD, RD, PT, OT, SLP, RN, CM, Pharmacy and SW  Financial Services: Private Insurance: BLUE MEDICARE  Choices offered to/list presented to:YES  Follow-up services arranged: Home Health: [facility]-PT,OT, SP and Patient/Family request agency HH: USED THEM BEFORE, DME: NO NEEDS HAS ALL EQUIPMENT FROM PREVIOUS ADMISSIONS REFERRAL MADE TO [facility] FOR NEW TUBE FEEDINGS SWITCH FROM [facility] TO [facility]  Comments (or additional information):DAUGHTER' S ARE HERE DAILY AND PARTICIPATE IN HER CARE. CAN PROVIDE SUPERVISION LEVEL AT HOME  Patient/Family verbalized understanding of follow-up arrangements: Yes  Individual responsible for coordination of the follow-up plan: BONNIE-DAUGHTER [phone]  Confirmed correct DME delivered: Elease Hashimoto 12/29/2020    Elease Hashimoto

## 2020-12-29 NOTE — Progress Notes (Signed)
Patient ID: [name] Day, female   DOB: 05-03-1929, 87 y.o.   MRN: [id number]  I made referral to [facility] to change pt's tube feedings at home form [facility] to [facility]. [facility] to contact one of her daughter's regarding delivery of tube feedings.

## 2020-12-29 NOTE — Progress Notes (Signed)
Physical Therapy Session Note  Patient Details  Name: [name] Day MRN: [id number] Date of Birth: 1929-07-22  Today's Date: 12/29/2020 PT Individual Time: 4599-7741 PT Individual Time Calculation (min): 25 min   Short Term Goals: Week 1:  PT Short Term Goal 1 (Week 1): STGs=LTGs  Skilled Therapeutic Interventions/Progress Updates:   Received pt semi-reclined in bed, pt agreeable to therapy, and denied any pain during session. Session with focus on functional mobility/transfers, generalized strengthening, dynamic standing balance/coordination, gait training, and improved activity tolerance. Pt performed bed mobility mod I using bedrails with HOB elevated x 2 trials throughout session. Sit<>stands with rollator and supervision throughout session. Pt ambulated 151ft x 2 trials with rollator and supervision to/from dayroom. Pt does require assist to reposition RUE on rollator handgrip. Worked on dynamic standing balance playing connect 4 using RUE for functional strength without AD and close supervision for balance x 2 trials. Concluded session with pt semi-reclined in bed, needs within reach, and bed alarm on. Provided ice chips for pt.   Therapy Documentation Precautions:  Precautions Precautions: Fall Restrictions Weight Bearing Restrictions: No Other Position/Activity Restrictions: PEG  Therapy/Group: Individual Therapy Alfonse Alpers PT, DPT   12/29/2020, 7:22 AM

## 2020-12-29 NOTE — Progress Notes (Signed)
Physical Therapy Session Note  Patient Details  Name: [name] Day MRN: [id number] Date of Birth: 08/31/1929  Today's Date: 12/29/2020 PT Individual Time: 0800-0845 PT Individual Time Calculation (min): 45 min   Short Term Goals: Week 1:  PT Short Term Goal 1 (Week 1): STGs=LTGs Week 2:    Week 3:     Skilled Therapeutic Interventions/Progress Updates:    Pain:  Pt reports no pain.  Treatment to tolerance.  Rest breaks and repositioning as needed.  Pt initially supine and agreeable to treatment session w/focus on am ADLs and balance. Supine to sit w/bedrail and supervision.  Pt doffs nightgown w/min assist,  Dons shirt w/set up, pants w/min assist in sit and stand.  Dons shoes w/set up, additional time.   Gait 66ft to commode and commode transfer w/RW and close supervision.  I w/hygiene and min assist w/clothing management. Pt stands at sink w/close supervision, brushes teeth and washes hands and face w/set up. Therapist changed PEG dressing per pt request. Pt transported to gym.  Gait 194ft no AD w/cga, cues to attend to R step length and armswing, performed for balance and coordination challenge.  Standing at wall pt reaches in all quadrants to touch numbered squares 1-6 w/minimal cueing, pt counts aloud while touching numbers.  When asked to perform by touching numbers in reverse order, pt w/much difficulty w/naming and following order of numbers correctly.  cga only for balance.    Standing tapping 5in colored cones first by counting then by naming colors for dual task challenge, min to mod assist overall for balance, performed L and R.  wc propulsion using RUE only to allow increased attention to R (w/armrest flipped back to allow for pt to visualize UE during task), propelled 40 ft w/therapist assisting on L.  Pt transported to room, daughter at side, pt left oob in wc w/alarm belt set and needs in reach  Therapy Documentation Precautions:  Precautions Precautions:  Fall Restrictions Weight Bearing Restrictions: No Other Position/Activity Restrictions: PEG    Therapy/Group: Individual Therapy  Callie Fielding, [place] 12/29/2020, 12:11 PM

## 2020-12-30 LAB — GLUCOSE, CAPILLARY
Glucose-Capillary: 135 mg/dL — ABNORMAL HIGH (ref 70–99)
Glucose-Capillary: 177 mg/dL — ABNORMAL HIGH (ref 70–99)
Glucose-Capillary: 114 mg/dL — ABNORMAL HIGH (ref 70–99)
Glucose-Capillary: 157 mg/dL — ABNORMAL HIGH (ref 70–99)

## 2020-12-30 MED ORDER — KATE FARMS STANDARD 1.4 PO LIQD: 325.0000 mL | Freq: Four times a day (QID) | ORAL | Status: AC

## 2020-12-30 MED ORDER — FUROSEMIDE 20 MG PO TABS: 10.0000 mg | ORAL_TABLET | Freq: Every day | ORAL | 0 refills | Status: DC

## 2020-12-30 MED ORDER — PANTOPRAZOLE SODIUM 40 MG PO PACK: 40.0000 mg | PACK | Freq: Every day | ORAL | 0 refills | Status: AC

## 2020-12-30 MED ORDER — FREE WATER: 100.0000 mL | Freq: Four times a day (QID) | Status: DC

## 2020-12-30 MED ORDER — POLYETHYLENE GLYCOL 3350 17 G PO PACK: 17.0000 g | PACK | Freq: Every day | ORAL | 0 refills | Status: DC | PRN

## 2020-12-30 MED ORDER — CLOPIDOGREL BISULFATE 75 MG PO TABS: ORAL_TABLET | ORAL | 2 refills | Status: AC

## 2020-12-30 MED ORDER — POLYVINYL ALCOHOL 1.4 % OP SOLN: 1.0000 [drp] | OPHTHALMIC | 0 refills | Status: DC | PRN

## 2020-12-30 MED ORDER — LISINOPRIL 2.5 MG PO TABS: 2.5000 mg | ORAL_TABLET | Freq: Every day | ORAL | 0 refills | Status: DC

## 2020-12-30 MED ORDER — ROPINIROLE HCL 2 MG PO TABS: 2.0000 mg | ORAL_TABLET | Freq: Two times a day (BID) | ORAL | 0 refills | Status: AC

## 2020-12-30 MED ORDER — METFORMIN HCL 500 MG PO TABS: 500.0000 mg | ORAL_TABLET | Freq: Every day | ORAL | 0 refills | Status: DC

## 2020-12-30 NOTE — Progress Notes (Signed)
Occupational Therapy Session Note  Patient Details  Name: [name] MRN: [id number] Date of Birth: 1929-07-26  Today's Date: 12/30/2020 OT Individual Time: 6834-1962 OT Individual Time Calculation (min): 75 min   Short Term Goals: Week 1:  OT Short Term Goal 1 (Week 1): STGs = LTGs  Skilled Therapeutic Interventions/Progress Updates:    Pt greeted in a bedside chair, cheerful, no c/o pain, requesting to shower this AM. She completed bathing (sit<stand from TTB, IV covered and PEG dressings changed by RN afterwards), dressing/toileting (sit<stand from standard toilet), and grooming tasks (EOB during session). All functional transfers completed using RW with close supervision assistance, BADLs completed with the same assistance. She required min cues for Rt attention as well as for sequencing during bathing tasks. Noted some dropping with the Rt hand while engaging in stated tasks (I.e. shampoo bottle, shirt, gripper sock). Family education completed with daughter, [name], who is a Marine scientist and has caregiver experience (her late husband was a CVA survivor). [name] assisted her husband with TTB transfers using the RW. Able to verbalize to OT proper technique of transfer. [name] also able to demonstrate how to safely assist pt to the restroom using the RW at ambulatory level, providing good safety cues in regards to hand placement during sit<stands and backing up fully before sitting on toilet. We discussed having pt safely participate during IADLs at home with supervision for remediation of functional skills and also to fill her days with purposeful/meaningful activities. [name] appeared receptive to all education, declining for OT to provide her with handouts or additional education, stating she felt comfortable with caregiver role that she would take on, aware of pts need for 24/7 supervision at home. At end of tx pt remained in bed with all needs within reach. Left bed alarm off as [name] was cleared  as a trained caregiver for toilet transfers during session. [name] educated to notify staff when she leaves so that they can set pt up with proper safety alarms as appropriate.   Therapy Documentation Precautions:  Precautions Precautions: Fall Precaution Comments: PEG. Expressive Aphasia Restrictions Weight Bearing Restrictions: No Other Position/Activity Restrictions: PEG Vital Signs: Therapy Vitals Pulse Rate: (!) 44 BP: 119/86 Pain: Pain Assessment Pain Scale: 0-10 Pain Score: 0-No pain ADL: ADL Eating: NPO Grooming: Supervision/safety Where Assessed-Grooming: Standing at sink Upper Body Bathing: Supervision/safety Where Assessed-Upper Body Bathing: Shower Lower Body Bathing: Contact guard Where Assessed-Lower Body Bathing: Shower Upper Body Dressing: Minimal assistance Where Assessed-Upper Body Dressing: Chair Lower Body Dressing: Minimal assistance Where Assessed-Lower Body Dressing: Chair Toileting: Contact guard Where Assessed-Toileting: Glass blower/designer: Close supervision Armed forces technical officer Method: Counselling psychologist: Emergency planning/management officer Transfer: Environmental education officer Method: Heritage manager: Transfer tub bench,Grab bars     Therapy/Group: Individual Therapy  Osby Sweetin A Danalee Flath 12/30/2020, 12:13 PM

## 2020-12-30 NOTE — Discharge Instructions (Signed)
Inpatient Rehab Discharge Instructions  [name] Day Discharge date and time: 01/01/21    Activities/Precautions/ Functional Status: Activity: no lifting, driving, or strenuous exercise  till cleared by MD Diet: Nothing by mouth. Oral care four times a day.  Wound Care: keep wound clean and dry    Functional status:  ___ No restrictions     ___ Walk up steps independently _X__ 24/7 supervision/assistance   ___ Walk up steps with assistance ___ Intermittent supervision/assistance  ___ Bathe/dress independently ___ Walk with walker     ___ Bathe/dress with assistance ___ Walk Independently    ___ Shower independently ___ Walk with assistance    ___ Shower with assistance _X__ No alcohol     ___ Return to work/school ________  Special Instructions: COMMUNITY REFERRALS UPON DISCHARGE:    Home Health:   PT, OT, SP                  Agency:[facility] [phone]   Medical Equipment/Items Ordered:NEW TUBE FEEDINGS FROM [facility] TO [facility]                                                  Agency/Supplier: [facility]  [id number]     My questions have been answered and I understand these instructions. I will adhere to these goals and the provided educational materials after my discharge from the hospital.  Patient/Caregiver Signature _______________________________ Date __________  Clinician Signature _______________________________________ Date __________  Please bring this form and your medication list with you to all your follow-up doctor's appointments.

## 2020-12-30 NOTE — Progress Notes (Signed)
[facility] PHYSICAL MEDICINE & REHABILITATION PROGRESS NOTE  Subjective/Complaints: Patient seen this morning.  She states that she slept well overnight.  She is very eager for discharge on Sunday.  Family at bedside with questions regarding tube feeds.  She asks me to evaluate a late lesion on her labia.  ROS: Denies CP, SOB, N/V/D.  Objective: Vital Signs: Blood pressure 119/86, pulse (!) 44, temperature 97.9 F (36.6 C), resp. rate 16, weight 51 kg, SpO2 100 %. No results found. No results for input(s): WBC, HGB, HCT, PLT in the last 72 hours. No results for input(s): NA, K, CL, CO2, GLUCOSE, BUN, CREATININE, CALCIUM in the last 72 hours.  Intake/Output Summary (Last 24 hours) at 12/30/2020 0912 Last data filed at 12/30/2020 8299 Gross per 24 hour  Intake 100 ml  Output --  Net 100 ml     Pressure Injury 12/24/20 Sacrum Mid Stage 1 -  Intact skin with non-blanchable redness of a localized area usually over a bony prominence. (Active)  12/24/20 2126  Location: Sacrum  Location Orientation: Mid  Staging: Stage 1 -  Intact skin with non-blanchable redness of a localized area usually over a bony prominence.  Wound Description (Comments):   Present on Admission:     Physical Exam: BP 119/86   Pulse (!) 44   Temp 97.9 F (36.6 C)   Resp 16   Wt 51 kg   SpO2 100%   BMI 17.61 kg/m   Constitutional: No distress . Vital signs reviewed. HENT: Normocephalic.  Atraumatic. Eyes: EOMI. No discharge. Cardiovascular: No JVD.  RRR. Respiratory: Normal effort.  No stridor.  Bilateral clear to auscultation. GI: Non-distended.  BS +. + PEG.  Skin: Warm and dry.  Intact. Scattered ecchymosis.  Cystic lesion on right labial fold Psych: Normal mood.  Normal behavior. Musc: No edema in extremities.  No tenderness in extremities. Neuro: Alert Sensation diminished to light touch right side Dysarthria, improving Mild expressive aphasia, improving Motor: LUE/LLE: 5/5 proximal to  distal RUE: 4+/5 proximal to distal, unchanged RLE: 4+-5/5 proximal distal  Assessment/Plan: 1. Functional deficits which require 3+ hours per day of interdisciplinary therapy in a comprehensive inpatient rehab setting.  Physiatrist is providing close team supervision and 24 hour management of active medical problems listed below.  Physiatrist and rehab team continue to assess barriers to discharge/monitor patient progress toward functional and medical goals   Care Tool:  Bathing    Body parts bathed by patient: Right arm,Left arm,Chest,Abdomen,Front perineal area,Buttocks,Right upper leg,Left upper leg,Right lower leg,Left lower leg,Face         Bathing assist Assist Level: Supervision/Verbal cueing     Upper Body Dressing/Undressing Upper body dressing   What is the patient wearing?: Pull over shirt    Upper body assist Assist Level: Supervision/Verbal cueing    Lower Body Dressing/Undressing Lower body dressing      What is the patient wearing?: Underwear/pull up,Pants     Lower body assist Assist for lower body dressing: Supervision/Verbal cueing     Toileting Toileting    Toileting assist Assist for toileting: Supervision/Verbal cueing     Transfers Chair/bed transfer  Transfers assist     Chair/bed transfer assist level: Supervision/Verbal cueing     Locomotion Ambulation   Ambulation assist   Ambulation activity did not occur: N/A  Assist level: Supervision/Verbal cueing Assistive device: Rollator Max distance: 160ft   Walk 10 feet activity   Assist     Assist level: Supervision/Verbal cueing Assistive device: Rollator  Walk 50 feet activity   Assist    Assist level: Supervision/Verbal cueing Assistive device: Rollator    Walk 150 feet activity   Assist    Assist level: Supervision/Verbal cueing Assistive device: Rollator    Walk 10 feet on uneven surface  activity   Assist     Assist level: Contact  Guard/Touching assist Assistive device: Rollator   Wheelchair     Assist Will patient use wheelchair at discharge?: No             Wheelchair 50 feet with 2 turns activity    Assist            Wheelchair 150 feet activity     Assist           Medical Problem List and Plan: 1.  Right hemiparesis as well as expressive aphasia secondary to L MCA stroke  Continue CIR  Plan for discharge on 4/3, will see patient tomorrow hospital follow-up in 1 month postdischarge. 2.  Antithrombotics: -DVT/anticoagulation:  Mechanical: Sequential compression devices, below knee Bilateral lower extremities  Lovenox             -antiplatelet therapy: ASA/Plavix 3. Headaches/Pain Management: Tylenol prn  Controlled on 4/1  Monitor with increased exertion 4. Mood: LCSW to follow for evaluation and support.              -antipsychotic agents: N/A 5. Neuropsych: This patient is ?fully capable of making decisions on her own behalf. 6. Skin/Wound Care: Routine pressure relief measures.  7. Fluids/Electrolytes/Nutrition: PEG in place  Tube feeds qid with water flushes.             Tolerating "[employer]"--asked family to check into vegan supplemental feeds.  8. Acute on chronic systolic CHF: Compensated. Will monitor weight daily and for signs of overload            On Coreg bid, Lasix and ASA--no statin due to myalgias.             Continue to hold Lisinopril and Spironolactone.  Filed Weights   12/28/20 0345 12/29/20 0338 12/30/20 0357  Weight: 54.1 kg 51.4 kg 51 kg   Stable on 4/1 9. Pre-renal azotemia: BUN around 50 at baseline. Continue water flushes.             BUN/Cr improving on 3/28  10.  Tube feed induced hyperglycemia on T2DM with hyperglycemia: Hgb A1c-6.9. Monitor BS ac/hs prior to tube feeds.   Continue SSI for elevated BS  Metformin started on 3/30  Improving on 4/1  Monitor with increased mobility 11. HTN: Monitor BP tid--continue Coreg and Lasix.    Lisinopril 2.5 started on 3/31  Improving on 4/1  Monitor with increased mobility 12. Esophageal stricture/mesenteric ischemia:   On tube feeds qid--tolerating current formula without diarrhea.   G tube was replaced on 10/2020  13. UTI: Rocephin transition to amoxicillin completed on 3/30  Cultures showing Proteus mirabilis 14. Constipation:   Continue Senna S with miralax. 15.  Esophageal dysphagia  Cont PEG  See #12 16. Hypoalbuminemia  Supplement through tube feeds 17.  Anemia of chronic disease  Hemoglobin 10.2 on 3/28, continue to monitor 18.  Diarrhea  Bowel meds DC'd on 3/29  Continue to monitor  Improved  LOS: 8 days A FACE TO FACE EVALUATION WAS PERFORMED  Nicoli Nardozzi Lorie Phenix 12/30/2020, 9:12 AM

## 2020-12-30 NOTE — Discharge Summary (Signed)
Physical Therapy Discharge Summary  Patient Details  Name: [name] Day MRN: [id number] Date of Birth: 1928-10-02  Today's Date: 12/31/2020 PT Individual Time: 0175-1025 + 1100-1158 PT Individual Time Calculation (min): 25 min  + 58 min   Patient has met 7 of 7 long term goals due to improved activity tolerance, improved balance, improved postural control, increased strength, ability to compensate for deficits, functional use of  right upper extremity, improved attention and improved awareness.  Patient to discharge at an ambulatory level Supervision.   Patient's care partner is independent to provide the necessary physical and cognitive assistance at discharge.  Reasons goals not met: n/a  Recommendation:  Patient will benefit from ongoing skilled PT services in home health setting to continue to advance safe functional mobility, address ongoing impairments in dynamic>static balance, RUE>RLE weakness, endurance and gait deficits in order to minimize fall risk.  Equipment: No equipment provided. Pt owns all needed DME  Reasons for discharge: treatment goals met and discharge from hospital  Patient/family agrees with progress made and goals achieved: Yes  PT Discharge Precautions/Restrictions Precautions Precautions: Fall Precaution Comments: PEG. Expressive Aphasia Restrictions Weight Bearing Restrictions: No Vital Signs Therapy Vitals Temp: 97.9 F (36.6 C) Pulse Rate: (!) 50 Resp: 16 BP: (!) 120/40 Patient Position (if appropriate): Lying Oxygen Therapy SpO2: 100 % Vision/Perception  Perception Perception: Within Functional Limits Praxis Praxis: Intact  Cognition Overall Cognitive Status: Within Functional Limits for tasks assessed Arousal/Alertness: Awake/alert Orientation Level: Oriented X4 Memory: Appears intact Awareness: Appears intact Problem Solving: Appears intact Safety/Judgment: Appears intact Sensation Sensation Light Touch: Impaired  Detail Peripheral sensation comments: impaired sensation in right hand; leads to decr coordination Light Touch Impaired Details: Impaired RUE Hot/Cold: Impaired Detail Hot/Cold Impaired Details: Impaired RUE Proprioception: Impaired Detail Proprioception Impaired Details: Impaired RUE Stereognosis: Impaired by gross assessment;Impaired Detail Stereognosis Impaired Details: Impaired RUE Additional Comments: difficulty to fully assess but significant functional deficits noted Coordination Gross Motor Movements are Fluid and Coordinated: Yes Fine Motor Movements are Fluid and Coordinated: Yes Coordination and Movement Description: decr [place] in right hand- difficulty with holding items with proper grasp Finger Nose Finger Test: dysmetria still present  Motor  Motor Motor: Hemiplegia Motor - Discharge Observations: RUE > RLE weakness - much improved since date of evaluation  Mobility Bed Mobility Bed Mobility: Rolling Right;Rolling Left;Supine to Sit;Sit to Supine Rolling Right: Independent Rolling Left: Independent Supine to Sit: Independent Sit to Supine: Independent Transfers Transfers: Sit to Stand;Stand to Sit;Squat Pivot Transfers Sit to Stand: Independent with assistive device Stand to Sit: Independent with assistive device Stand Pivot Transfers: Independent with assistive device Transfer (Assistive device): Rollator (3-wheeled rollator) Locomotion  Gait Ambulation: Yes Gait Assistance: Independent with assistive device Gait Distance (Feet): 350 Feet Assistive device: Rollator Gait Assistance Details: Verbal cues for precautions/safety Gait Gait: Yes Gait Pattern: Impaired (poor coordination with RUE grip) Gait Pattern: Within Functional Limits;Step-through pattern;Decreased step length - left;Decreased step length - right Gait velocity: decreased Stairs / Additional Locomotion Stairs: Yes Stairs Assistance: Contact Guard/Touching assist Stair Management Technique: Two  rails Number of Stairs: 12 Height of Stairs: 6 Ramp: Supervision/Verbal cueing Wheelchair Mobility Wheelchair Mobility: No  Trunk/Postural Assessment  Cervical Assessment Cervical Assessment: Within Functional Limits Thoracic Assessment Thoracic Assessment: Within Functional Limits Lumbar Assessment Lumbar Assessment: Within Functional Limits Postural Control Postural Limitations: Age related changes - posterior pelvic tilt with slight kyphotic posturing - delayed righting and protective reactions with limited ankle strategies.  Balance Balance Balance Assessed: Yes Standardized Balance  Assessment Standardized Balance Assessment: Berg Balance Test Berg Balance Test Sit to Stand: Able to stand without using hands and stabilize independently Standing Unsupported: Able to stand safely 2 minutes Sitting with Back Unsupported but Feet Supported on Floor or Stool: Able to sit safely and securely 2 minutes Stand to Sit: Sits safely with minimal use of hands Transfers: Able to transfer safely, minor use of hands Standing Unsupported with Eyes Closed: Able to stand 10 seconds with supervision Standing Ubsupported with Feet Together: Able to place feet together independently and stand for 1 minute with supervision From Standing, Reach Forward with Outstretched Arm: Can reach forward >5 cm safely (2") From Standing Position, Pick up Object from Floor: Able to pick up shoe safely and easily From Standing Position, Turn to Look Behind Over each Shoulder: Looks behind from both sides and weight shifts well Turn 360 Degrees: Able to turn 360 degrees safely but slowly Standing Unsupported, Alternately Place Feet on Step/Stool: Able to complete >2 steps/needs minimal assist Standing Unsupported, One Foot in Front: Able to take small step independently and hold 30 seconds Standing on One Leg: Unable to try or needs assist to prevent fall Total Score: 41 Static Sitting Balance Static Sitting -  Level of Assistance: 5: Stand by assistance Dynamic Sitting Balance Dynamic Sitting - Level of Assistance: 5: Stand by assistance Static Standing Balance Static Standing - Level of Assistance: 5: Stand by assistance Dynamic Standing Balance Dynamic Standing - Level of Assistance: 4: Min assist;Other (comment) (still requires a RW for dynamic balance and at least one Ue for stabilization)  Extremity Assessment  RUE Assessment RUE Assessment: Exceptions to [facility] Active Range of Motion (AROM) Comments: WFL General Strength Comments: 4-/5 Brunstrom IV-V RUE Body System: Neuro Brunstrum levels for arm and hand: Arm;Hand Brunstrum level for arm: Stage V Relative Independence from Synergy Brunstrum level for hand: Stage IV Movements deviating from synergies;Stage V Independence from basic synergies LUE Assessment LUE Assessment: Within Functional Limits General Strength Comments: 4/5 RLE Assessment RLE Assessment: Within Functional Limits LLE Assessment LLE Assessment: Within Functional Limits  Skilled Intervention:  1st session: Pt greeted supine in bed, awake and agreeable to therapy - no reports of pain. Supine<>sit mod I with bed features. Sit<>Stand mod I to rollator, does a good job of making sure brakes are locked but still has difficulty with RUE grip. Ambulated from her room to main rehab gym, ~234ft, with supervision and rollator. Stair training up/down x12 steps with CGA and 2 hand rails - step to pattern with both ascent and descent - is well aware of her RUE hand placement and advances it well along the hand rails. Ambulated with supervision and rollator to ortho gym, ~188ft, and ambulated up/down ~83ft low incline ramp to simulate home entrance - completes with supervision and not much difficulty. Seated rest break before ambulating back to her room, ~377ft, with supervision and rollator. Pt requesting to remain seated in chair - stand>sit mod I to regular height chair and pt remained  there at end of session with daughter at bedside and needs within reach.  2nd session: Pt sleeping on arrival, awakens easily to voice and agreeable to therapy - no reports of pain. Supine<>sit mod I with bed features. Sit<>stand mod I to rollator and she ambulated ~245ft to day room gym mod I with rollator. Gait training with no AD and minA for steadying while completing obstacle course consisting of stepping over 6inch hurdles and weaving in/out of cones. She struggled most with  stepping over hurdle and needed to completed stop before stepping over instead of having a continuous gait pattern.   Pt completed BERG with results listed and described above - brief seated rest breaks needed during completion. Patient demonstrates increased fall risk as noted by score of 41/56 on Berg Balance Scale.  (<36= high risk for falls, close to 100%; 37-45 significant >80%; 46-51 moderate >50%; 52-55 lower >25%)  Completed x6 minutes of Nustep with workload of 5, using both BUE/BLE focusing on reciprocal movement patterns and general strengthening, needing occasional cueing to monitor her RUE grip to handle to ensure accurate hand placement.   Ambulated back to her room mod I with 3-wheeled rollator, ~176f. Pt requesting to use bathroom so she ambulated in bathroom and remained seated on toilet at end of session - instructed to use pull cord when completed for nurse assist. NT also made aware at end of session.   Undra Trembath P Meghana Tullo PT, DPT 12/30/2020, 7:42 AM

## 2020-12-30 NOTE — Progress Notes (Signed)
Patient ID: [name] Day, female   DOB: 1928-10-17, 87 y.o.   MRN: [id number]  Met with daughter and pt to inform has a past due balance with [employer] and needs to pay this before formula can be shipped to home. Daughter will call to get this taken care of.

## 2020-12-30 NOTE — Progress Notes (Signed)
Physical Therapy Session Note  Patient Details  Name: [name] Day MRN: [id number] Date of Birth: Dec 16, 1928  Today's Date: 12/30/2020 PT Individual Time: 0301-4996 PT Individual Time Calculation (min): 55 min   Short Term Goals: Week 1:  PT Short Term Goal 1 (Week 1): STGs=LTGs  Skilled Therapeutic Interventions/Progress Updates:     Pt greeted supine in bed, awake and agreeable to therapy - no reports of pain - endorses eagerness to return home and is excited for upcoming DC. Supine<>sit mod I with bed features. Sit<>stand mod I to her 3-wheeled rollator and she ambulated ~229ft with supervision and rollator to main rehab gym. NMR for standing balance on blue foam pad with task overlay for RUE [facility] with peg board placement and resistive clothes pins - pt unable to grasp peg's adequately with RUE to place in PEG board with 3 jaw chuck grasp - minA needed for standing balance while completing these. Gait training with no AD while holding a "serving tray" with 1/2 cup of water - 2x17ft with CGA/minA needed for steadying (she did not spill the water!). Ambulated back to her room with supervision and 3-wheeled rollator. Bed mobility completed mod I with hospital bed features. She remained supine in bed with needs in reach and bed alarm on, daughter at bedside.  Therapy Documentation Precautions:  Precautions Precautions: Fall Restrictions Weight Bearing Restrictions: No Other Position/Activity Restrictions: PEG General:    Therapy/Group: Individual Therapy  Sarha Bartelt P Birgitta Uhlir PT 12/30/2020, 7:29 AM

## 2020-12-30 NOTE — Progress Notes (Signed)
Speech Language Pathology Discharge Summary  Patient Details  Name: [name] Day MRN: [id number] Date of Birth: [birth year]  Today's Date: 12/30/2020 SLP Individual Time: 1105-1200 SLP Individual Time Calculation (min): 55 min   Today's Date: 12/31/2020 SLP Individual Time: 0315-9458 SLP Individual Time Calculation (min): 45 min  Patient has met 4 of 4 long term goals.  Patient to discharge at overall Supervision;Min level.  Reasons goals not met: N/A   Clinical Impression/Discharge Summary: Patient has made very good progress and met all LTG"s for speech-language. At time of discharge, she is exhibiting primarily a motor based, verbal apraxia, dysarthria with mild expressive language disorder. Receptive language and cognitive function both appear to be [facility]. Patient able to communicate her needs verbally at word and phrase level without significant difficulty when given extra time from communication partner. She will benefit from OP ST services to continue with progress of her language and speech function.    4/1 therapy session: Skilled Therapeutic Interventions:  Patient seen with daughter present for education of progress and recommendations for home exercises to continue with progress with her speech and language production. Patient able to communicate effectively at phrase level but continues with what appears to be primarily motor based speech apraxia and dysarthria with mild expressive language impairment. SLP demonstrated how to work on /s/ blends, /l/ initial position.  4/2 Grad day therapy session: Skilled Therapeutic Interventions: Pt seen for final ST treatment session with continued education and practice regarding speech and language production following CVA. Pt benefits from cues to utilize overarticulation and slow rate during speech. Pt intelligibility ~90% at conversation level. Continue to recommend OP vs HH SLP services to optimize pt use of compensatory strategies as  trained and reduce frustration with ongoing dysarthria and verbal apraxia. Pt with no further questions at this time, left in bed with alarm set and all needs within reach.    Care Partner:  Caregiver Able to Provide Assistance: Yes  Type of Caregiver Assistance: Cognitive;Physical  Recommendation:  Outpatient SLP  Rationale for SLP Follow Up: Maximize functional communication   Equipment: N/A for speech   Reasons for discharge: Discharged from hospital;Treatment goals met   Patient/Family Agrees with Progress Made and Goals Achieved: Yes    Sonia Baller, [place], CCC-SLP [place], [place].Ed. CCC-SLP Speech Therapy

## 2020-12-31 DIAGNOSIS — E440 Moderate protein-calorie malnutrition: Secondary | ICD-10-CM

## 2020-12-31 LAB — GLUCOSE, CAPILLARY
Glucose-Capillary: 129 mg/dL — ABNORMAL HIGH (ref 70–99)
Glucose-Capillary: 140 mg/dL — ABNORMAL HIGH (ref 70–99)
Glucose-Capillary: 130 mg/dL — ABNORMAL HIGH (ref 70–99)
Glucose-Capillary: 134 mg/dL — ABNORMAL HIGH (ref 70–99)

## 2020-12-31 NOTE — Progress Notes (Signed)
Occupational Therapy Discharge Summary  Patient Details  Name: [name] Day MRN: [id number] Date of Birth: [birth year]  Today's Date: 12/31/2020 OT Individual Time: 4098-1191 OT Individual Time Calculation (min): 55 min   Grad day: Pt already donned clothes for the day and reported taking a shower yesterday. Daughter present and supportive. PT ambulated with RW to the bathroom to void and wash periarea and buttocks and change underwear. Pt able to complete all tasks with supervision/ setup of supplies. Pt able to perform sit to stand with supervision and performs task pushing up with bilateral UEs. Pt continues to demonstrate impaired coordination with right hand with under shooting and decr sensation in hand benefiting from visual feedback during tasks.   Pt ambulated from her room the dayroom with RW with supervision. At table top performed simulated "safe" sewing tasks with paper; including cutting while manipulating paper, papercliping two papers together (like pinning fabric together); "chalking" paper with "x"s with built up pen, and thumbing through paper/ post it notes. Focus on bilateral hand coordination, [facility] or right hand, visual feedback to correct positioning of hand to do a task she loves. Returned to room with supervision and reviewed [facility] activities to continue to practice at home. Left sitting up with her daughter.   Patient has met 8 of 8 long term goals due to improved activity tolerance, improved balance, postural control, ability to compensate for deficits, functional use of  RIGHT upper and RIGHT lower extremity and improved coordination.  Patient to discharge at overall Supervision level.  Patient's care partner is independent to provide the necessary physical assistance at discharge.    Reasons goals not met: n/a   Recommendation:  Patient will benefit from ongoing skilled OT services in home health setting to continue to advance functional skills in the area of BADL and  Reduce care partner burden.  Equipment: No equipment provided tub bench recommended for home   Reasons for discharge: treatment goals met and discharge from hospital  Patient/family agrees with progress made and goals achieved: Yes  OT Discharge Precautions/Restrictions  Precautions Precautions: Fall Precaution Comments: PEG. Expressive Aphasia Restrictions Other Position/Activity Restrictions: PEG General   Vital Signs Therapy Vitals Pulse Rate: 78 BP: 128/63 Pain Pain Assessment Pain Scale: 0-10 Pain Score: 0-No pain ADL ADL Eating: NPO Grooming: Supervision/safety Where Assessed-Grooming: Standing at sink Upper Body Bathing: Supervision/safety Where Assessed-Upper Body Bathing: Shower Lower Body Bathing: Supervision/safety Where Assessed-Lower Body Bathing: Shower Upper Body Dressing: Supervision/safety Where Assessed-Upper Body Dressing: Chair Lower Body Dressing: Supervision/safety Where Assessed-Lower Body Dressing: Chair Toileting: Supervision/safety Where Assessed-Toileting: Glass blower/designer: Close supervision Armed forces technical officer Method: Counselling psychologist: Emergency planning/management officer Transfer: Environmental education officer Method: Heritage manager: Transfer tub bench,Grab bars Vision Baseline Vision/History: Wears glasses Wears Glasses: At all times Patient Visual Report: No change from baseline Vision Assessment?: No apparent visual deficits Eye Alignment: Within Functional Limits Ocular Range of Motion: Within Functional Limits Alignment/Gaze Preference: Within Defined Limits Tracking/Visual Pursuits: Decreased smoothness of vertical tracking;Requires cues, head turns, or add eye shifts to track;Decreased smoothness of eye movement to RIGHT superior field Visual Fields: No apparent deficits Perception    Praxis   Cognition Overall Cognitive Status: Within Functional Limits for tasks  assessed Arousal/Alertness: Awake/alert Orientation Level: Oriented X4 Attention: Selective Selective Attention: Appears intact Memory: Appears intact Immediate Memory Recall: Sock;Blue;Bed Memory Recall Sock: Without Cue Memory Recall Blue: Without Cue Memory Recall Bed: Without Cue Awareness: Appears intact Problem Solving: Appears intact  Safety/Judgment: Appears intact Sensation Sensation Light Touch: Impaired Detail Peripheral sensation comments: impaired sensation in right hand; leads to decr coordination Light Touch Impaired Details: Impaired RUE Hot/Cold: Impaired Detail Hot/Cold Impaired Details: Impaired RUE Proprioception: Impaired Detail Proprioception Impaired Details: Impaired RUE Stereognosis: Impaired by gross assessment;Impaired Detail Stereognosis Impaired Details: Impaired RUE Coordination Gross Motor Movements are Fluid and Coordinated: Yes Fine Motor Movements are Fluid and Coordinated: Yes Coordination and Movement Description: decr [place] in right hand- difficulty with holding items with proper grasp Finger Nose Finger Test: dysmetria still present Motor  Motor Motor: Hemiplegia Motor - Discharge Observations: RUE > RLE weakness - much improved since date of evaluation Mobility  Bed Mobility Bed Mobility: Rolling Right;Rolling Left;Supine to Sit;Sit to Supine Rolling Right: Independent Rolling Left: Independent Right Sidelying to Sit: Contact Guard/Touching assist;Supervision/Verbal cueing;Independent Supine to Sit: Independent Sit to Supine: Independent Transfers Sit to Stand: Independent with assistive device Stand to Sit: Independent with assistive device  Trunk/Postural Assessment  Cervical Assessment Cervical Assessment: Within Functional Limits Thoracic Assessment Thoracic Assessment: Within Functional Limits Lumbar Assessment Lumbar Assessment: Within Functional Limits Postural Control Righting Reactions: delayed on r Protective  Responses: impaired R Postural Limitations: Age related changes - posterior pelvic tilt with slight kyphotic posturing - delayed righting and protective reactions with limited ankle strategies.  Balance Static Sitting Balance Static Sitting - Level of Assistance: 5: Stand by assistance Dynamic Sitting Balance Dynamic Sitting - Level of Assistance: 5: Stand by assistance Static Standing Balance Static Standing - Level of Assistance: 5: Stand by assistance Dynamic Standing Balance Dynamic Standing - Level of Assistance: 4: Min assist;Other (comment) (still requires a RW for dynamic balance and at least one Ue for stabilization) Extremity/Trunk Assessment RUE Assessment RUE Assessment: Exceptions to [facility] Active Range of Motion (AROM) Comments: WFL General Strength Comments: 4-/5 Brunstrom IV-V RUE Body System: Neuro Brunstrum levels for arm and hand: Arm;Hand Brunstrum level for arm: Stage V Relative Independence from Synergy Brunstrum level for hand: Stage IV Movements deviating from synergies;Stage V Independence from basic synergies LUE Assessment LUE Assessment: Within Functional Limits General Strength Comments: 4/5   Willeen Cass [facility] 12/31/2020, 10:32 AM

## 2020-12-31 NOTE — Progress Notes (Signed)
[place] PHYSICAL MEDICINE & REHABILITATION PROGRESS NOTE  Subjective/Complaints:   Patient reports her last bowel movement was this morning.  It was a little loose but not diarrhea.  According to her family member, they got her tube feeds ordered but they will not be there until Monday.  Was asking if we can supply tube feeds from Sunday to [place] will check with the charge nurse. The patient still complains of decreased fine motor skills in her right hand and some aphasia-she said "I have arthritis in my legs" but pointed to her fingers.  She also said that her sensation to light touch is slightly better.   ROS:  Pt denies SOB, abd pain, CP, N/V/C/D, and vision changes .  Objective: Vital Signs: Blood pressure (!) 137/50, pulse (!) 53, temperature 98.5 F (36.9 C), resp. rate 17, weight 51.5 kg, SpO2 100 %. No results found. No results for input(s): WBC, HGB, HCT, PLT in the last 72 hours. No results for input(s): NA, K, CL, CO2, GLUCOSE, BUN, CREATININE, CALCIUM in the last 72 hours.  Intake/Output Summary (Last 24 hours) at 12/31/2020 1642 Last data filed at 12/30/2020 1821 Gross per 24 hour  Intake 0 ml  Output --  Net 0 ml     Pressure Injury 12/24/20 Sacrum Mid Stage 1 -  Intact skin with non-blanchable redness of a localized area usually over a bony prominence. (Active)  12/24/20 2126  Location: Sacrum  Location Orientation: Mid  Staging: Stage 1 -  Intact skin with non-blanchable redness of a localized area usually over a bony prominence.  Wound Description (Comments):   Present on Admission:     Physical Exam: BP (!) 137/50 (BP Location: Right Arm)   Pulse (!) 53   Temp 98.5 F (36.9 C)   Resp 17   Wt 51.5 kg   SpO2 100%   BMI 17.78 kg/m      General: awake, alert, appropriate, ; sitting up in bedside chair ; a family member is also in the room ;NAD HENT: conjugate gaze; oropharynx moist CV: Bradycardic; no JVD Pulmonary: CTA B/L; no W/R/R- good air  movement GI: soft, NT, ND, (+)BS- (+) PEG in place Scattered ecchymosis.  Cystic lesion on right labial fold Psych: Normal mood.  Normal behavior. Bright affect Musc: No edema in extremities.  No tenderness in extremities. Neuro: Alert- mild aphasia noted, with word finiding issues and substitutions noted Sensation diminished to light touch right side Dysarthria, improving Mild expressive aphasia, improving Motor: LUE/LLE: 5/5 proximal to distal RUE: 4+/5 proximal to distal, unchanged RLE: 4+-5/5 proximal distal  Assessment/Plan: 1. Functional deficits which require 3+ hours per day of interdisciplinary therapy in a comprehensive inpatient rehab setting.  Physiatrist is providing close team supervision and 24 hour management of active medical problems listed below.  Physiatrist and rehab team continue to assess barriers to discharge/monitor patient progress toward functional and medical goals   Care Tool:  Bathing    Body parts bathed by patient: Right arm,Left arm,Chest,Abdomen,Front perineal area,Buttocks,Right upper leg,Left upper leg,Right lower leg,Left lower leg,Face         Bathing assist Assist Level: Supervision/Verbal cueing     Upper Body Dressing/Undressing Upper body dressing   What is the patient wearing?: Pull over shirt    Upper body assist Assist Level: Supervision/Verbal cueing    Lower Body Dressing/Undressing Lower body dressing      What is the patient wearing?: Underwear/pull up,Pants     Lower body assist Assist for lower  body dressing: Supervision/Verbal cueing     Toileting Toileting    Toileting assist Assist for toileting: Supervision/Verbal cueing     Transfers Chair/bed transfer  Transfers assist     Chair/bed transfer assist level: Independent with assistive device Chair/bed transfer assistive device: Other (3-wheeled rollator)   Locomotion Ambulation   Ambulation assist   Ambulation activity did not occur:  N/A  Assist level: Independent with assistive device Assistive device: Rollator Max distance: 339ft   Walk 10 feet activity   Assist     Assist level: Independent with assistive device Assistive device: Rollator   Walk 50 feet activity   Assist    Assist level: Independent with assistive device Assistive device: Rollator    Walk 150 feet activity   Assist    Assist level: Independent with assistive device Assistive device: Rollator    Walk 10 feet on uneven surface  activity   Assist     Assist level: Supervision/Verbal cueing Assistive device: Rollator   Wheelchair     Assist Will patient use wheelchair at discharge?: No   Wheelchair activity did not occur: N/A         Wheelchair 50 feet with 2 turns activity    Assist    Wheelchair 50 feet with 2 turns activity did not occur: N/A       Wheelchair 150 feet activity     Assist  Wheelchair 150 feet activity did not occur: N/A        Medical Problem List and Plan: 1.  Right hemiparesis as well as expressive aphasia secondary to L MCA stroke  4/2-continue PT OT and SLP-discharge tomorrow  Plan for discharge on 4/3, will see patient tomorrow hospital follow-up in 1 month postdischarge. 2.  Antithrombotics: -DVT/anticoagulation:  Mechanical: Sequential compression devices, below knee Bilateral lower extremities  Lovenox             -antiplatelet therapy: ASA/Plavix 3. Headaches/Pain Management: Tylenol prn  Controlled on 4/1  4/2-pain controlled; continue as needed medication  Monitor with increased exertion 4. Mood: LCSW to follow for evaluation and support.              -antipsychotic agents: N/A 5. Neuropsych: This patient is ?fully capable of making decisions on her own behalf. 6. Skin/Wound Care: Routine pressure relief measures.  7. Fluids/Electrolytes/Nutrition: PEG in place  Tube feeds qid with water flushes.             Tolerating "[employer]"--asked family to check  into vegan supplemental feeds.   4/2-we will see if nursing can get patient supplied with tube feeds for 1 day, so can be fed at home until her tube feeds arrive. 8. Acute on chronic systolic CHF: Compensated. Will monitor weight daily and for signs of overload            On Coreg bid, Lasix and ASA--no statin due to myalgias.  Filed Weights   12/29/20 0338 12/30/20 0357 12/31/20 0500  Weight: 51.4 kg 51 kg 51.5 kg   Stable on 4/1  4/2-weight stable; continue to hold lisinopril and spironolactone 9. Pre-renal azotemia: BUN around 50 at baseline. Continue water flushes.             BUN/Cr improving on 3/28  10.  Tube feed induced hyperglycemia on T2DM with hyperglycemia: Hgb A1c-6.9. Monitor BS ac/hs prior to tube feeds.   Continue SSI for elevated BS  Metformin started on 3/30  Improving on 4/1  4/2- BGs 114-157- doing better- con't  regimen  Monitor with increased mobility 11. HTN: Monitor BP tid--continue Coreg and Lasix.   Lisinopril 2.5 started on 3/31  Improving on 4/1  4/2- BP controlled- con't regimen  Monitor with increased mobility 12. Esophageal stricture/mesenteric ischemia:   On tube feeds qid--tolerating current formula without diarrhea.   G tube was replaced on 10/2020  13. UTI: Rocephin transition to amoxicillin completed on 3/30  Cultures showing Proteus mirabilis 14. Constipation:   Continue Senna S with miralax. 15.  Esophageal dysphagia  Cont PEG  See #12 16. Hypoalbuminemia  Supplement through tube feeds 17.  Anemia of chronic disease  Hemoglobin 10.2 on 3/28, continue to monitor 18.  Diarrhea  Bowel meds DC'd on 3/29  Continue to monitor  Improved  4/2-diarrhea has improved; bowel movements are only slightly loose on current tube feeds; continue regimen  LOS: 9 days A FACE TO FACE EVALUATION WAS PERFORMED  Delva Derden 12/31/2020, 4:42 PM

## 2021-01-01 LAB — GLUCOSE, CAPILLARY: Glucose-Capillary: 115 mg/dL — ABNORMAL HIGH (ref 70–99)

## 2021-01-01 NOTE — Progress Notes (Signed)
INPATIENT REHABILITATION DISCHARGE NOTE   Discharge instructions by: Jeannene Patella, PA  Verbalized understanding: yes  Skin care/Wound care: none  Pain: no complaints  IV's- none present   Tubes/Drains: Peg. Pt and family educated on feedings  Safety instructions: none  Patient belongings: sent with pt  Discharged to: home  Discharged via: WC with family  Notes: done

## 2021-01-02 ENCOUNTER — Other Ambulatory Visit: Payer: Medicare Other

## 2021-01-02 NOTE — Discharge Summary (Signed)
Physician Discharge Summary  Patient ID: [name] [name] MRN: [id number] DOB/AGE: [id number] 85 y.o.  Admit date: 12/22/2020 Discharge date: 01/01/2021  Discharge Diagnoses:  Principal Problem:   Cerebrovascular accident (CVA) due to occlusion of left middle cerebral artery (HCC) Active Problems:   Esophageal stricture   Gastrostomy tube in place [facility])   Right sided weakness   Stroke (cerebrum) (HCC)   Hypoalbuminemia due to protein-calorie malnutrition (HCC)   Anemia of chronic disease   Essential hypertension   Controlled type 2 diabetes mellitus with hyperglycemia, without long-term current use of insulin (HCC)   Malnutrition of moderate degree   Pressure injury of skin   Discharged Condition: stable   Significant Diagnostic Studies: N/A   Labs:  Basic Metabolic Panel: BMP Latest Ref Rng & Units 12/26/2020 12/23/2020 12/22/2020  Glucose 70 - 99 mg/dL 202(H) 158(H) 277(H)  BUN 8 - 23 mg/dL 25(H) 41(H) 47(H)  Creatinine 0.44 - 1.00 mg/dL 0.86 0.96 1.05(H)  BUN/Creat Ratio 6 - 22 (calc) - - -  Sodium 135 - 145 mmol/L 138 137 137  Potassium 3.5 - 5.1 mmol/L 4.6 4.5 4.6  Chloride 98 - 111 mmol/L 108 104 103  CO2 22 - 32 mmol/L 23 27 27   Calcium 8.9 - 10.3 mg/dL 8.6(L) 8.3(L) 8.1(L)    CBC: CBC Latest Ref Rng & Units 12/26/2020 12/23/2020 12/22/2020  WBC 4.0 - 10.5 K/uL 6.5 6.3 7.5  Hemoglobin 12.0 - 15.0 g/dL 10.2(L) 9.3(L) 9.7(L)  Hematocrit 36.0 - 46.0 % 31.1(L) 30.0(L) 31.0(L)  Platelets 150 - 400 K/uL 290 272 269    CBG: Recent Labs  Lab 12/31/20 0609 12/31/20 1138 12/31/20 1629 12/31/20 2111 01/01/21 0604  GLUCAP 129* 140* 130* 134* 115*    Brief HPI:   [name] is a 87 y.o. female with history of T2DM, CHF, DDD, mesenteric ischemia, esophageal stricture--on tube feeds who was admitted on [facility] on 12/17/20 with SOB due to pulmonary edema as well as right sided weakness with difficulty speaking due to acute/subacute L-MCA infarct as well as incidental  small aneurysm v/s infudibulum. Neurology recommended permissive HTN and DAPT with recommendations to continue Plavix for 90 days and felt that aneurysm v/s infundibulum presented low change of hemorrhage. Fluid overload treated with diuresis, tube feeds changed to [facility] with improvement in dumping syndrome and started on IV Cetriaxone 03/23 due to UTI. . She continued to have limitations due to aphasia with right sided weakness affecting ADLs and mobility. CIR was recommended due to functional decline.    Hospital Course: [name] was admitted to rehab 12/22/2020 for inpatient therapies to consist of PT, ST and OT at least three hours five days a week. Past admission physiatrist, therapy team and rehab RN have worked together to provide customized collaborative inpatient rehab. Follow up serial BMET showed improvement in pre-renal azotemia and lytes WNL. Follow up CBC showed H/H to be improving and platelets are stable on DAPT. She completed 5 [name] course of antibiotics for treatment of proteus UTI. Blood pressures were monitored on TID basis and low dose lisinopril was added for better control.   Weights were monitored daily and have been variable lower overall without signs of overload.  She is tolerating [employer] tube feeds without dumping syndrome/diarrhea. PEG site is C/D/I. Dietician has been following to help manage tube feeds and input on moderate calorie malnutrition. Her diabetes has been monitored with ac/hs CBG checks and SSI was use prn for tighter BS control.  Metformin was added on 03/31 due to elevated BS and anticipate BS to improve on this.  She has made gains during her rehab stay and currently requires supervision. She will continue to receive follow up [place] [place] [facility] [place] [facility] after discharge.    Rehab course: During patient's stay in rehab weekly team conference was held to monitor patient's progress, set goals and discuss barriers to discharge. At admission,  patient required min assist with mobility and ADL tasks. She exhibited moderate expressive and apraxia without significant cognitive impairment. She  has had improvement in activity tolerance, balance, postural control as well as ability to compensate for deficits. She requires supervision with ADL tasks and with mobility. She is able to communicate wants and need at word and phrase level with extra time. She continues to have motor apraxia with dysarthria and mild expressive deficits. Family has been present daily and has completed education regarding all aspects of care and safety.    Disposition: Home  Diet: NPO  Special Instructions: 1. Continue tube feeds 325 ml qid with 100 cc water flushes qid. 2.    Discharge Instructions    Ambulatory referral to Physical Medicine Rehab   Complete by: As directed    3-4 week follow up appt     Allergies as of 01/01/2021      Reactions   Elemental Sulfur Diarrhea, Nausea And Vomiting   Gabapentin Swelling   Lipitor [atorvastatin] Other (See Comments)   Muscle aches   Mevacor [lovastatin] Other (See Comments)   Muscle aches   Milk-related Compounds Other (See Comments)   Large quantities cause headaches    Septra [sulfamethoxazole-trimethoprim] Other (See Comments)   Unknown   Statins Other (See Comments)   Muscle pain   Zocor [simvastatin] Other (See Comments)   Muscle aches      Medication List    STOP taking these medications   allopurinol 100 MG tablet Commonly known as: ZYLOPRIM   ascorbic acid 250 MG tablet Commonly known as: VITAMIN C   cefTRIAXone 1 g in sodium chloride 0.9 % 100 mL   dextromethorphan-guaiFENesin 30-600 MG 12hr tablet Commonly known as: MUCINEX DM   docusate 50 MG/5ML liquid Commonly known as: COLACE   feeding supplement (PRO-STAT SUGAR FREE 64) Liqd   meclizine 25 MG tablet Commonly known as: ANTIVERT     TAKE these medications   acetaminophen 160 MG/5ML solution Commonly known as:  TYLENOL Place 20.3 mLs (650 mg total) into feeding tube every 6 (six) hours as needed for mild pain.   aspirin 81 MG chewable tablet Place 1 tablet (81 mg total) into feeding tube daily.   carvedilol 3.125 MG tablet Commonly known as: COREG Place 1 tablet (3.125 mg total) into feeding tube 2 (two) times daily with a meal.   clopidogrel 75 MG tablet Commonly known as: PLAVIX GIVE 1 TABLET VIA FEEDING TUBE EVERY [name] AS DIRECTED What changed: See the new instructions.   feeding supplement (KATE FARMS STANDARD 1.4) Liqd liquid Place 325 mLs into feeding tube 4 (four) times daily. What changed:   how much to take  when to take this  Another medication with the same name was removed. Continue taking this medication, and follow the directions you see here.   free water Soln Place 100 mLs into feeding tube every 6 (six) hours. What changed:   how much to take  when to take this   furosemide 20 MG tablet Commonly known as: LASIX Place 0.5 tablets (  10 mg total) into feeding tube daily.   hydrocerin Crea Apply 1 application topically 2 (two) times daily. To bilateral feet Notes to patient: OTC   Hypromellose 0.2 % Soln Place 1 drop into both eyes 3 (three) times daily as needed (dry eyes). Notes to patient: OTC   lisinopril 2.5 MG tablet Commonly known as: ZESTRIL Take 1 tablet (2.5 mg total) by mouth daily.   metFORMIN 500 MG tablet Commonly known as: GLUCOPHAGE Place 1 tablet (500 mg total) into feeding tube daily with breakfast.   multivitamin Liqd Place 15 mLs into feeding tube daily.   pantoprazole sodium 40 mg/20 mL Pack Commonly known as: PROTONIX Place 20 mLs (40 mg total) into feeding tube daily.   polyethylene glycol 17 g packet Commonly known as: MIRALAX / GLYCOLAX Place 17 g into feeding tube daily as needed. What changed:   when to take this  reasons to take this   polyvinyl alcohol 1.4 % ophthalmic solution Commonly known as: LIQUIFILM  TEARS Place 1 drop into both eyes as needed for dry eyes.   rOPINIRole 2 MG tablet Commonly known as: REQUIP Place 1 tablet (2 mg total) into feeding tube 2 (two) times daily. Notes to patient: For restless legs       Follow-up Information    Jamse Arn, MD Follow up.   Specialty: Physical Medicine and Rehabilitation Why: Office to call with appt Contact information: [address] [place] [address] [phone]        [name], MD. Call.   Specialty: Internal Medicine Why: for post hospital follow up and referral to neurology Contact information: [address] [place] [address] [phone]        Teodoro Spray, MD. Call.   Specialty: Cardiology Why: for appt Contact information: [place] [address] [phone]        Anabel Bene, MD Follow up.   Specialty: Neurology Why: stroke follow up   Contact information: [facility] [name] [address] [phone]               Signed: Bary Leriche 01/02/2021, 9:42 AM

## 2021-01-03 ENCOUNTER — Other Ambulatory Visit: Payer: Self-pay

## 2021-01-03 ENCOUNTER — Inpatient Hospital Stay: Payer: Medicare Other | Attending: Hematology and Oncology

## 2021-01-03 DIAGNOSIS — D509 Iron deficiency anemia, unspecified: Secondary | ICD-10-CM | POA: Diagnosis present

## 2021-01-03 LAB — CBC WITH DIFFERENTIAL/PLATELET
Abs Immature Granulocytes: 0.07 10*3/uL (ref 0.00–0.07)
Basophils Absolute: 0.1 10*3/uL (ref 0.0–0.1)
Basophils Relative: 1 %
Eosinophils Absolute: 0.5 10*3/uL (ref 0.0–0.5)
Eosinophils Relative: 6 %
HCT: 29 % — ABNORMAL LOW (ref 36.0–46.0)
Hemoglobin: 9.3 g/dL — ABNORMAL LOW (ref 12.0–15.0)
Immature Granulocytes: 1 %
Lymphocytes Relative: 15 %
Lymphs Abs: 1.3 10*3/uL (ref 0.7–4.0)
MCH: 28.8 pg (ref 26.0–34.0)
MCHC: 32.1 g/dL (ref 30.0–36.0)
MCV: 89.8 fL (ref 80.0–100.0)
Monocytes Absolute: 0.6 10*3/uL (ref 0.1–1.0)
Monocytes Relative: 7 %
Neutro Abs: 6.2 10*3/uL (ref 1.7–7.7)
Neutrophils Relative %: 70 %
Platelets: 349 10*3/uL (ref 150–400)
RBC: 3.23 MIL/uL — ABNORMAL LOW (ref 3.87–5.11)
RDW: 14.7 % (ref 11.5–15.5)
WBC: 8.7 10*3/uL (ref 4.0–10.5)
nRBC: 0 % (ref 0.0–0.2)

## 2021-01-03 LAB — FERRITIN: Ferritin: 40 ng/mL (ref 11–307)

## 2021-01-19 ENCOUNTER — Other Ambulatory Visit: Payer: Self-pay

## 2021-01-19 ENCOUNTER — Encounter: Payer: Self-pay | Admitting: Physical Medicine & Rehabilitation

## 2021-01-19 ENCOUNTER — Encounter: Payer: Medicare Other | Attending: Physical Medicine & Rehabilitation | Admitting: Physical Medicine & Rehabilitation

## 2021-01-19 VITALS — BP 125/68 | HR 62 | Temp 98.0°F | Ht 67.0 in | Wt 117.0 lb

## 2021-01-19 DIAGNOSIS — R531 Weakness: Secondary | ICD-10-CM | POA: Diagnosis present

## 2021-01-19 DIAGNOSIS — K222 Esophageal obstruction: Secondary | ICD-10-CM | POA: Diagnosis not present

## 2021-01-19 DIAGNOSIS — E1165 Type 2 diabetes mellitus with hyperglycemia: Secondary | ICD-10-CM | POA: Insufficient documentation

## 2021-01-19 DIAGNOSIS — R269 Unspecified abnormalities of gait and mobility: Secondary | ICD-10-CM | POA: Diagnosis present

## 2021-01-19 DIAGNOSIS — I63512 Cerebral infarction due to unspecified occlusion or stenosis of left middle cerebral artery: Secondary | ICD-10-CM | POA: Insufficient documentation

## 2021-01-19 DIAGNOSIS — Z931 Gastrostomy status: Secondary | ICD-10-CM | POA: Insufficient documentation

## 2021-01-19 NOTE — Progress Notes (Signed)
Subjective:    Patient ID: [name] Day, female    DOB: [birth year], 87 y.o.   MRN: [id number]  HPI   Female with history of T2DM, CHF, DDD, mesenteric ischemia, esophageal stricture--on tube feeds who was admitted on [facility] on 12/17/20 with SOB due to pulmonary edema as well as right sided weakness with difficulty speaking due to acute/subacute L-MCA infarct as well as incidental small aneurysm v/s infudibulum.   Family provides history. Since discharge, she is tolerating tube feeds. She followed up with PCP, Cards.  She has an appointment with Neuro. Denies headaches. She has been checking weight, reviewed personal chart, relatively stable.  CBGs have been elevated, PCP making changes to meds.  BP has been controlled. Denies falls. Bowel movements have normalized.   Therapies: 1-2/week DME: Previously possessed  Mobility: Rolator   Pain Inventory Average Pain 2 Pain Right Now 0 My pain is not sure  LOCATION OF PAIN  head  BOWEL Number of stools per week: 3 x day Oral laxative use No  Type of laxative n/a Enema or suppository use No  History of colostomy No  Incontinent No   BLADDER Normal In and out cath, frequency n/a Able to self cath Yes  Bladder incontinence No  Frequent urination No  Leakage with coughing No  Difficulty starting stream No  Incomplete bladder emptying No    Mobility walk with assistance use a cane use a walker how many minutes can you walk? n/a ability to climb steps?  no do you drive?  no needs help with transfers Do you have any goals in this area?  yes  Function retired I need assistance with the following:  feeding, dressing, household duties and shopping Do you have any goals in this area?  yes  Neuro/Psych bowel control problems  Prior Studies n/a  Physicians involved in your care n/a   Family History  Problem Relation Age of Onset  . Stroke Mother   . Hypertension Mother   . Heart disease Mother   . Cancer Father    . Heart disease Father   . Heart attack Sister   . Diabetes Sister   . Heart attack Brother    Social History   Socioeconomic History  . Marital status: Widowed    Spouse name: Not on file  . Number of children: Not on file  . Years of education: Not on file  . Highest education level: Not on file  Occupational History  . Not on file  Tobacco Use  . Smoking status: Never Smoker  . Smokeless tobacco: Never Used  Vaping Use  . Vaping Use: Never used  Substance and Sexual Activity  . Alcohol use: Never  . Drug use: Never  . Sexual activity: Not Currently  Other Topics Concern  . Not on file  Social History Narrative   Lives alone.    Daughter helps her.    Social Determinants of Health   Financial Resource Strain: Not on file  Food Insecurity: Not on file  Transportation Needs: Not on file  Physical Activity: Not on file  Stress: Not on file  Social Connections: Not on file   Past Surgical History:  Procedure Laterality Date  . ABDOMINAL HYSTERECTOMY    . APPENDECTOMY    . ARTHROGRAM KNEE Left   . BACK SURGERY     CERVIVAL NECK FUSION  . CATARACT EXTRACTION W/PHACO Left 11/11/2018   Procedure: CATARACT EXTRACTION PHACO AND INTRAOCULAR LENS PLACEMENT (IOC) LEFT, DIABETIC;  Surgeon: Birder Robson, MD;  Location: [facility] ORS;  Service: Ophthalmology;  Laterality: Left;  [place]  00:41 CDE 6.41 Fluid pack lot # [id number]  . COLONOSCOPY WITH PROPOFOL N/A 06/20/2017   Procedure: COLONOSCOPY WITH PROPOFOL;  Surgeon: Lollie Sails, MD;  Location: [facility] ENDOSCOPY;  Service: Endoscopy;  Laterality: N/A;  . ESOPHAGOGASTRODUODENOSCOPY (EGD) WITH PROPOFOL N/A 03/14/2015   Procedure: ESOPHAGOGASTRODUODENOSCOPY (EGD) WITH PROPOFOL;  Surgeon: Josefine Class, MD;  Location: [facility] ENDOSCOPY;  Service: Endoscopy;  Laterality: N/A;  . ESOPHAGOGASTRODUODENOSCOPY (EGD) WITH PROPOFOL N/A 03/28/2017   Procedure: ESOPHAGOGASTRODUODENOSCOPY (EGD) WITH PROPOFOL;  Surgeon: Lollie Sails, MD;  Location: [facility] ENDOSCOPY;  Service: Endoscopy;  Laterality: N/A;  . ESOPHAGOGASTRODUODENOSCOPY (EGD) WITH PROPOFOL N/A 06/20/2017   Procedure: ESOPHAGOGASTRODUODENOSCOPY (EGD) WITH PROPOFOL;  Surgeon: Lollie Sails, MD;  Location: [facility] ENDOSCOPY;  Service: Endoscopy;  Laterality: N/A;  . ESOPHAGOGASTRODUODENOSCOPY (EGD) WITH PROPOFOL N/A 10/21/2017   Procedure: ESOPHAGOGASTRODUODENOSCOPY (EGD) WITH PROPOFOL;  Surgeon: Lollie Sails, MD;  Location: [facility] ENDOSCOPY;  Service: Endoscopy;  Laterality: N/A;  . ESOPHAGOGASTRODUODENOSCOPY (EGD) WITH PROPOFOL N/A 04/15/2018   Procedure: ESOPHAGOGASTRODUODENOSCOPY (EGD) WITH PROPOFOL;  Surgeon: Lollie Sails, MD;  Location: [facility] ENDOSCOPY;  Service: Endoscopy;  Laterality: N/A;  . ESOPHAGUS SURGERY     CLOSURE  . EYE SURGERY    . HYSTERECTOMY ABDOMINAL WITH SALPINGECTOMY    . IR GASTROSTOMY TUBE MOD SED  06/11/2018  . IR GASTROSTOMY TUBE REMOVAL  03/25/2019  . IR REPLACE G-TUBE SIMPLE WO FLUORO  10/19/2020  . PERIPHERAL VASCULAR CATHETERIZATION N/A 01/23/2016   Procedure: Visceral Venography;  Surgeon: Algernon Huxley, MD;  Location: [place] CV LAB;  Service: Cardiovascular;  Laterality: N/A;  . PERIPHERAL VASCULAR CATHETERIZATION  01/23/2016   Procedure: Peripheral Vascular Intervention;  Surgeon: Algernon Huxley, MD;  Location: [place] CV LAB;  Service: Cardiovascular;;  . UPPER ESOPHAGEAL ENDOSCOPIC ULTRASOUND (EUS) N/A 12/05/2017   Procedure: UPPER ESOPHAGEAL ENDOSCOPIC ULTRASOUND (EUS);  Surgeon: Jola Schmidt, MD;  Location: [facility] ENDOSCOPY;  Service: Endoscopy;  Laterality: N/A;  . VISCERAL ANGIOGRAPHY N/A 03/18/2017   Procedure: Visceral Angiography;  Surgeon: Algernon Huxley, MD;  Location: [place] CV LAB;  Service: Cardiovascular;  Laterality: N/A;  . VISCERAL ARTERY INTERVENTION N/A 03/18/2017   Procedure: Visceral Artery Intervention;  Surgeon: Algernon Huxley, MD;  Location: [place] CV LAB;  Service: Cardiovascular;   Laterality: N/A;   Past Medical History:  Diagnosis Date  . Anemia    IRON INFUSIONS  . Aortic valve sclerosis   . Arrhythmia   . Arthritis    osteoarthritis  . Basal cell carcinoma of skin   . Brain tumor ([place])   . Brain tumor ([place])   . Cervical spine disease   . CHF (congestive heart failure) ([place])   . Diabetes mellitus without complication ([place])   . Dysrhythmia    sinus arrhythmia  . Esophageal stricture    severe, led to feeding tube placement in Sept 2019  . Esophageal ulcer without bleeding   . Gastrostomy tube dependent ([place])    DOES NOT EAT OR DRINK   . GERD (gastroesophageal reflux disease)   . History of kidney stones   . Hyperlipemia   . Hypertension   . Kidney stones   . Leaky heart valve   . Meningioma ([place])   . Occlusive mesenteric ischemia ([place])   . Pulmonary hypertension ([place])   . RLS (restless legs syndrome)   . Vertigo    BP 125/68  Pulse 62   Temp 98 F (36.7 C)   Ht 5\' 7"  (1.702 m)   Wt 117 lb (53.1 kg)   SpO2 99%   BMI 18.32 kg/m   Opioid Risk Score:   Fall Risk Score:  `1  Depression screen PHQ 2/9  Depression screen [facility] 2/9 01/19/2021 01/19/2021 01/08/2018  Decreased Interest 0 0 0  Down, Depressed, Hopeless 0 0 0  PHQ - 2 Score 0 0 0  Altered sleeping 0 - -  Tired, decreased energy 2 - -  Change in appetite 0 - -  Feeling bad or failure about yourself  0 - -  Trouble concentrating 0 - -  Moving slowly or fidgety/restless 0 - -  Suicidal thoughts 0 - -  PHQ-9 Score 2 - -  Some recent data might be hidden      Review of Systems  Constitutional: Negative.   HENT: Negative.   Eyes: Negative.   Respiratory: Negative.   Cardiovascular: Negative.   Gastrointestinal:       +PEG  Endocrine: Negative.   Genitourinary: Negative.   Musculoskeletal: Negative.   Skin: Negative.   Allergic/Immunologic: Negative.   Neurological: Positive for speech difficulty, weakness and numbness.  Hematological: Negative.    Psychiatric/Behavioral: Negative.       Objective:   Physical Exam  Constitutional: No distress . Vital signs reviewed. HENT: Normocephalic.  Atraumatic. Eyes: EOMI. No discharge. Cardiovascular: No JVD.   Respiratory: Normal effort.  No stridor.   GI: Non-distended.   Skin: Warm and dry.  Intact. Psych: Normal mood.  Normal behavior. Musc: No edema in extremities.  No tenderness in extremities. Neuro: Alert Expressive aphasia, stable Sensation diminished to light touch right side Dysarthria, stable Motor: LUE/LLE: 5/5 proximal to distal RUE: 4+/5 proximal to distal, unchanged RLE: 4+-5/5 proximal distal    Assessment & Plan:  Female with history of T2DM, CHF, DDD, mesenteric ischemia, esophageal stricture--on tube feeds who was admitted on [facility] on 12/17/20 with SOB due to pulmonary edema as well as right sided weakness with difficulty speaking due to acute/subacute L-MCA infarct as well as incidental small aneurysm v/s infudibulum.   1.  Right hemiparesis as well as expressive aphasia secondary to L MCA stroke             Cont therapies  2. Acute on chronic systolic CHF: Compensated.   Weights reviewed, stable  Continue meds  3. Pre-renal azotemia: BUN around 50 at baseline.   Continue water flushes.             BUN/Cr improving on 3/28   4.  Tube feed induced hyperglycemia on T2DM with hyperglycemia: Hgb A1c-6.9. Monitor BS ac/hs prior to tube feeds.              Home CBGs reviewed, elevated, cont to follow up with PCP for adjustments  5. HTN: Monitor BP tid  Cont meds  Controlled at present  6. Esophageal stricture/mesenteric ischemia:              Cont tube feeds qid             G tube was replaced on 10/2020   Cont PEG  7. Gait abnormality   Cont rolator  Cont therapies

## 2021-02-15 ENCOUNTER — Other Ambulatory Visit: Payer: Self-pay

## 2021-02-15 ENCOUNTER — Emergency Department: Payer: Medicare Other

## 2021-02-15 ENCOUNTER — Observation Stay
Admission: EM | Admit: 2021-02-15 | Discharge: 2021-02-17 | Disposition: A | Discharge status: Home / Self Care | Payer: Medicare Other | Attending: Internal Medicine | Admitting: Internal Medicine

## 2021-02-15 DIAGNOSIS — R0602 Shortness of breath: Secondary | ICD-10-CM | POA: Diagnosis present

## 2021-02-15 DIAGNOSIS — I5020 Unspecified systolic (congestive) heart failure: Secondary | ICD-10-CM | POA: Diagnosis not present

## 2021-02-15 DIAGNOSIS — D509 Iron deficiency anemia, unspecified: Principal | ICD-10-CM | POA: Insufficient documentation

## 2021-02-15 DIAGNOSIS — Z85828 Personal history of other malignant neoplasm of skin: Secondary | ICD-10-CM | POA: Insufficient documentation

## 2021-02-15 DIAGNOSIS — Z7984 Long term (current) use of oral hypoglycemic drugs: Secondary | ICD-10-CM | POA: Diagnosis not present

## 2021-02-15 DIAGNOSIS — I130 Hypertensive heart and chronic kidney disease with heart failure and stage 1 through stage 4 chronic kidney disease, or unspecified chronic kidney disease: Secondary | ICD-10-CM | POA: Insufficient documentation

## 2021-02-15 DIAGNOSIS — Z79899 Other long term (current) drug therapy: Secondary | ICD-10-CM | POA: Diagnosis not present

## 2021-02-15 DIAGNOSIS — I5042 Chronic combined systolic (congestive) and diastolic (congestive) heart failure: Secondary | ICD-10-CM | POA: Diagnosis not present

## 2021-02-15 DIAGNOSIS — I639 Cerebral infarction, unspecified: Secondary | ICD-10-CM | POA: Diagnosis present

## 2021-02-15 DIAGNOSIS — E119 Type 2 diabetes mellitus without complications: Secondary | ICD-10-CM | POA: Diagnosis present

## 2021-02-15 DIAGNOSIS — N179 Acute kidney failure, unspecified: Secondary | ICD-10-CM | POA: Diagnosis present

## 2021-02-15 DIAGNOSIS — I5043 Acute on chronic combined systolic (congestive) and diastolic (congestive) heart failure: Secondary | ICD-10-CM | POA: Diagnosis present

## 2021-02-15 DIAGNOSIS — D649 Anemia, unspecified: Secondary | ICD-10-CM | POA: Diagnosis present

## 2021-02-15 DIAGNOSIS — N1831 Chronic kidney disease, stage 3a: Secondary | ICD-10-CM | POA: Diagnosis not present

## 2021-02-15 DIAGNOSIS — I10 Essential (primary) hypertension: Secondary | ICD-10-CM | POA: Diagnosis present

## 2021-02-15 DIAGNOSIS — Z20822 Contact with and (suspected) exposure to covid-19: Secondary | ICD-10-CM | POA: Insufficient documentation

## 2021-02-15 DIAGNOSIS — K219 Gastro-esophageal reflux disease without esophagitis: Secondary | ICD-10-CM

## 2021-02-15 DIAGNOSIS — E1122 Type 2 diabetes mellitus with diabetic chronic kidney disease: Secondary | ICD-10-CM | POA: Insufficient documentation

## 2021-02-15 DIAGNOSIS — K222 Esophageal obstruction: Secondary | ICD-10-CM

## 2021-02-15 DIAGNOSIS — R0600 Dyspnea, unspecified: Secondary | ICD-10-CM | POA: Diagnosis not present

## 2021-02-15 DIAGNOSIS — E875 Hyperkalemia: Secondary | ICD-10-CM

## 2021-02-15 DIAGNOSIS — Z7982 Long term (current) use of aspirin: Secondary | ICD-10-CM | POA: Diagnosis not present

## 2021-02-15 DIAGNOSIS — E1129 Type 2 diabetes mellitus with other diabetic kidney complication: Secondary | ICD-10-CM | POA: Diagnosis present

## 2021-02-15 LAB — CBC
HCT: 20.6 % — ABNORMAL LOW (ref 36.0–46.0)
HCT: 19.8 % — ABNORMAL LOW (ref 36.0–46.0)
HCT: 26.3 % — ABNORMAL LOW (ref 36.0–46.0)
Hemoglobin: 6.3 g/dL — ABNORMAL LOW (ref 12.0–15.0)
Hemoglobin: 6 g/dL — ABNORMAL LOW (ref 12.0–15.0)
Hemoglobin: 8.3 g/dL — ABNORMAL LOW (ref 12.0–15.0)
MCH: 26.6 pg (ref 26.0–34.0)
MCH: 26.9 pg (ref 26.0–34.0)
MCH: 27.5 pg (ref 26.0–34.0)
MCHC: 30.6 g/dL (ref 30.0–36.0)
MCHC: 30.3 g/dL (ref 30.0–36.0)
MCHC: 31.6 g/dL (ref 30.0–36.0)
MCV: 86.9 fL (ref 80.0–100.0)
MCV: 88.8 fL (ref 80.0–100.0)
MCV: 87.1 fL (ref 80.0–100.0)
Platelets: 387 10*3/uL (ref 150–400)
Platelets: 342 10*3/uL (ref 150–400)
Platelets: 361 10*3/uL (ref 150–400)
RBC: 2.37 MIL/uL — ABNORMAL LOW (ref 3.87–5.11)
RBC: 2.23 MIL/uL — ABNORMAL LOW (ref 3.87–5.11)
RBC: 3.02 MIL/uL — ABNORMAL LOW (ref 3.87–5.11)
RDW: 15 % (ref 11.5–15.5)
RDW: 14.8 % (ref 11.5–15.5)
RDW: 14.5 % (ref 11.5–15.5)
WBC: 11.7 10*3/uL — ABNORMAL HIGH (ref 4.0–10.5)
WBC: 9.8 10*3/uL (ref 4.0–10.5)
WBC: 11.6 10*3/uL — ABNORMAL HIGH (ref 4.0–10.5)
nRBC: 0 % (ref 0.0–0.2)
nRBC: 0 % (ref 0.0–0.2)
nRBC: 0 % (ref 0.0–0.2)

## 2021-02-15 LAB — FERRITIN: Ferritin: 8 ng/mL — ABNORMAL LOW (ref 11–307)

## 2021-02-15 LAB — RETICULOCYTES
Immature Retic Fract: 31.9 % — ABNORMAL HIGH (ref 2.3–15.9)
RBC.: 2.38 MIL/uL — ABNORMAL LOW (ref 3.87–5.11)
Retic Count, Absolute: 96.2 10*3/uL (ref 19.0–186.0)
Retic Ct Pct: 4 % — ABNORMAL HIGH (ref 0.4–3.1)

## 2021-02-15 LAB — RESP PANEL BY RT-PCR (FLU A&B, COVID) ARPGX2
Influenza A by PCR: NEGATIVE
Influenza B by PCR: NEGATIVE
SARS Coronavirus 2 by RT PCR: NEGATIVE

## 2021-02-15 LAB — PROTIME-INR
INR: 1.1 (ref 0.8–1.2)
Prothrombin Time: 14.4 seconds (ref 11.4–15.2)

## 2021-02-15 LAB — BASIC METABOLIC PANEL
Anion gap: 11 (ref 5–15)
BUN: 72 mg/dL — ABNORMAL HIGH (ref 8–23)
CO2: 22 mmol/L (ref 22–32)
Calcium: 8.5 mg/dL — ABNORMAL LOW (ref 8.9–10.3)
Chloride: 99 mmol/L (ref 98–111)
Creatinine, Ser: 1.5 mg/dL — ABNORMAL HIGH (ref 0.44–1.00)
GFR, Estimated: 33 mL/min — ABNORMAL LOW (ref >60)
Glucose, Bld: 256 mg/dL — ABNORMAL HIGH (ref 70–99)
Potassium: 6.1 mmol/L — ABNORMAL HIGH (ref 3.5–5.1)
Sodium: 132 mmol/L — ABNORMAL LOW (ref 135–145)

## 2021-02-15 LAB — IRON AND TIBC
Iron: 19 ug/dL — ABNORMAL LOW (ref 28–170)
Saturation Ratios: 4 % — ABNORMAL LOW (ref 10.4–31.8)
TIBC: 447 ug/dL (ref 250–450)
UIBC: 428 ug/dL

## 2021-02-15 LAB — GLUCOSE, CAPILLARY: Glucose-Capillary: 108 mg/dL — ABNORMAL HIGH (ref 70–99)

## 2021-02-15 LAB — PREPARE RBC (CROSSMATCH)

## 2021-02-15 LAB — VITAMIN B12: Vitamin B-12: 525 pg/mL (ref 180–914)

## 2021-02-15 LAB — APTT: aPTT: 28 seconds (ref 24–36)

## 2021-02-15 LAB — TROPONIN I (HIGH SENSITIVITY): Troponin I (High Sensitivity): 227 ng/L (ref <18)

## 2021-02-15 LAB — FOLATE: Folate: 52 ng/mL (ref >5.9)

## 2021-02-15 LAB — BRAIN NATRIURETIC PEPTIDE: B Natriuretic Peptide: 1515.3 pg/mL — ABNORMAL HIGH (ref 0.0–100.0)

## 2021-02-15 MED ORDER — CALCIUM GLUCONATE-NACL 1-0.675 GM/50ML-% IV SOLN
1.0000 g | Freq: Once | INTRAVENOUS | Status: AC
  Administered 2021-02-15: 1000 mg via INTRAVENOUS
  Filled 2021-02-15: qty 50

## 2021-02-15 MED ORDER — DM-GUAIFENESIN ER 30-600 MG PO TB12: 1.0000 | ORAL_TABLET | Freq: Two times a day (BID) | ORAL | Status: DC | PRN

## 2021-02-15 MED ORDER — ADULT MULTIVITAMIN LIQUID CH
15.0000 mL | Freq: Every day | ORAL | Status: DC
  Administered 2021-02-16: 15 mL
  Filled 2021-02-15 (×2): qty 15

## 2021-02-15 MED ORDER — INSULIN ASPART 100 UNIT/ML IJ SOLN
0.0000 [IU] | Freq: Three times a day (TID) | INTRAMUSCULAR | Status: DC
  Administered 2021-02-16: 7 [IU] via SUBCUTANEOUS
  Administered 2021-02-16: 3 [IU] via SUBCUTANEOUS
  Administered 2021-02-17: 2 [IU] via SUBCUTANEOUS
  Filled 2021-02-15 (×3): qty 1

## 2021-02-15 MED ORDER — INSULIN ASPART 100 UNIT/ML IJ SOLN
0.0000 [IU] | Freq: Every day | INTRAMUSCULAR | Status: DC
  Filled 2021-02-15: qty 1

## 2021-02-15 MED ORDER — PANTOPRAZOLE SODIUM 40 MG IV SOLR
40.0000 mg | Freq: Two times a day (BID) | INTRAVENOUS | Status: DC
  Administered 2021-02-16 (×2): 40 mg via INTRAVENOUS
  Filled 2021-02-15 (×2): qty 40

## 2021-02-15 MED ORDER — INSULIN ASPART 100 UNIT/ML IV SOLN
5.0000 [IU] | Freq: Once | INTRAVENOUS | Status: AC
  Administered 2021-02-15: 5 [IU] via INTRAVENOUS
  Filled 2021-02-15: qty 0.05

## 2021-02-15 MED ORDER — ONDANSETRON HCL 4 MG/2ML IJ SOLN: 4.0000 mg | Freq: Three times a day (TID) | INTRAMUSCULAR | Status: DC | PRN

## 2021-02-15 MED ORDER — ROPINIROLE HCL 1 MG PO TABS
2.0000 mg | ORAL_TABLET | Freq: Two times a day (BID) | ORAL | Status: DC
  Administered 2021-02-15 – 2021-02-17 (×4): 2 mg
  Filled 2021-02-15 (×4): qty 2

## 2021-02-15 MED ORDER — SODIUM CHLORIDE 0.9 % IV SOLN
10.0000 mL/h | Freq: Once | INTRAVENOUS | Status: AC
  Administered 2021-02-15: 10 mL/h via INTRAVENOUS

## 2021-02-15 MED ORDER — DEXTROSE 50 % IV SOLN
25.0000 mL | Freq: Once | INTRAVENOUS | Status: AC
  Administered 2021-02-15: 25 mL via INTRAVENOUS
  Filled 2021-02-15: qty 50

## 2021-02-15 MED ORDER — SODIUM CHLORIDE 0.9 % IV BOLUS
1000.0000 mL | Freq: Once | INTRAVENOUS | Status: AC
  Administered 2021-02-15: 1000 mL via INTRAVENOUS

## 2021-02-15 MED ORDER — SODIUM ZIRCONIUM CYCLOSILICATE 10 G PO PACK
10.0000 g | PACK | Freq: Once | ORAL | Status: AC
  Administered 2021-02-15: 10 g via ORAL
  Filled 2021-02-15: qty 1

## 2021-02-15 MED ORDER — FUROSEMIDE 10 MG/ML IJ SOLN
20.0000 mg | Freq: Once | INTRAMUSCULAR | Status: AC
  Administered 2021-02-15: 20 mg via INTRAVENOUS
  Filled 2021-02-15: qty 4

## 2021-02-15 MED ORDER — SODIUM ZIRCONIUM CYCLOSILICATE 5 G PO PACK
5.0000 g | PACK | Freq: Once | ORAL | Status: DC
  Filled 2021-02-15: qty 1

## 2021-02-15 MED ORDER — POLYVINYL ALCOHOL 1.4 % OP SOLN
1.0000 [drp] | OPHTHALMIC | Status: DC | PRN
  Filled 2021-02-15: qty 15

## 2021-02-15 MED ORDER — ACETAMINOPHEN 325 MG PO TABS: 650.0000 mg | ORAL_TABLET | Freq: Four times a day (QID) | ORAL | Status: DC | PRN

## 2021-02-15 MED ORDER — ALBUTEROL SULFATE HFA 108 (90 BASE) MCG/ACT IN AERS
2.0000 | INHALATION_SPRAY | RESPIRATORY_TRACT | Status: DC | PRN
  Filled 2021-02-15 (×2): qty 6.7

## 2021-02-15 MED ORDER — KATE FARMS STANDARD 1.4 PO LIQD
325.0000 mL | Freq: Four times a day (QID) | ORAL | Status: DC
  Administered 2021-02-16 – 2021-02-17 (×4): 325 mL
  Filled 2021-02-15: qty 325

## 2021-02-15 MED ORDER — FREE WATER
100.0000 mL | Freq: Four times a day (QID) | Status: DC
  Administered 2021-02-15 – 2021-02-16 (×3): 100 mL
  Filled 2021-02-15 (×3): qty 100

## 2021-02-15 NOTE — Consult Note (Signed)
[name] Antigua, MD [address] [place], [place], [place], [address] [address], [place], [place], [place], [address] Phone: [phone]  Fax: [phone]  Consultation  Referring Provider:      Dr. Blaine Hamper Primary Care Physician:  [name], MD Reason for Consultation:     Anemia Primary gastroenterologist: [facility]  Date of Admission:  02/15/2021 Date of Consultation:  02/15/2021         HPI:   [name] is a 87 y.o. female presents with shortness of breath at home for 1 [name], with generalized weakness over the last week, with GI being consulted for anemia.  Patient and daughter deny any episodes of active bleeding from any sources.  No melena, no hematochezia.  No nausea or vomiting.  Denies any abdominal pain.  Patient also reported lower extremity edema in the ED.  Chest x-ray shows bilateral pulmonary edema  Patient has chronic PEG due to esophageal stricture.  As per [facility] GI notes, PEG was placed in September 2019 under fluoroscopy by IR, and replaced due to falling out in February 2021.  Patient is also on Plavix at home for peripheral arterial disease as per vascular surgery, and has chronic iron deficiency anemia for which she sees oncology, Dr. Mike Gip.  She receives IV iron transfusions per oncology as needed.  Patient has had several upper endoscopies before with [facility] GI, Dr. Gustavo Lah, Dr. Rayann Heman, and with their [facility] providers, Dr. Francella Solian, Dr. Egbert Garibaldi  As per their clinic note from February 2021 EGD details as per below:  "EGD- 05/09/18- Dr Egbert Garibaldi and [facility]- food impacted in the esohagus (removed). severe esophageal stenosis- able to be dilated to 92mm but unable to pass scope past area afterwards. EGD with dilation 05/01/18- Dr Bayard Hugger at [facility]- benign appearing esophageal stenosis that was dilated. EGD- 04/15/18- benign appearing esophageal stenosis. Extrinsic compression on esphagus. Path showed parakeratosis,  negative dysplasia/malignancy. There was no further CMV found on this exam. EUS 12/05/17- Dr Francella Solian- Severe esophageal stenosis at 39cm, not traversed due to its tightness; limited US exam did not demonstrate any abnormal lymph nodes- there was loss of normal wall layering due to inflammation. There was no extrinsic mass. 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 There was no HSV or malignancy. She was referred to ID for management & treated with valcyclovir EGD 10/21/17- Dr Melody Haver- tortuous Esophagus, ring noted which was dilated; esophagitis, gastric ulcer, gastritis Path did not demonstrated chronic inflammation without barretts/dysplasia/malignancy ACBE 10/14/17- no lesions/polyps/masses/abnormal mucosal pattern EGD 06/20/17- Dr Tyson Babinski- linear esophageal ulcer- somewht improved from EGD done 6/18. Gastritis  Colonoscopy 06/20/17- Dr Skulskie/heme+- limited to sigmoid colon due to tortuosity. Small polyp removed from rectosigmoid colon. EGD 03/28/17- Dr Sheron Nightingale [facility]- Esophageal ulcer, esophagitis, gastritis. There was noted some extrinsic compression on the esophageal wall. CT afterwards did not find any extra-esophageal mass and attributed it to esophageal wall thickening. EGD 03/14/15- Dr Rein/ dysphagia, heartburn, epigastric pain- pill at the [facility] that passed with air. La grade A esophagitis, few gastric ulcers. Dilated esophagus  Other recent GI imaging CT abdomen/pelvis 11/18/18 done for ed visit relating to abdominal pain/diarrhea- no acute findings. 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 There were some elevated SMA velocities, but there were no signs of chronic visceral ischemia.Was evaluated by Dr Lucky Cowboy that [name]"     Past Medical History:  Diagnosis Date  . Anemia    IRON INFUSIONS  . Aortic  valve sclerosis   . Arrhythmia   . Arthritis    osteoarthritis  . Basal  cell carcinoma of skin   . Brain tumor ([place])   . Brain tumor ([place])   . Cervical spine disease   . CHF (congestive heart failure) ([place])   . Diabetes mellitus without complication ([place])   . Dysrhythmia    sinus arrhythmia  . Esophageal stricture    severe, led to feeding tube placement in Sept 2019  . Esophageal ulcer without bleeding   . Gastrostomy tube dependent ([place])    DOES NOT EAT OR DRINK   . GERD (gastroesophageal reflux disease)   . History of kidney stones   . Hyperlipemia   . Hypertension   . Kidney stones   . Leaky heart valve   . Meningioma ([place])   . 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 Pulmonary hypertension ([place])   . RLS (restless legs syndrome)   . Vertigo     Past Surgical History:  Procedure Laterality Date  . ABDOMINAL HYSTERECTOMY    . APPENDECTOMY    . ARTHROGRAM KNEE Left   . BACK SURGERY     CERVIVAL NECK FUSION  . CATARACT EXTRACTION W/PHACO Left 11/11/2018   Procedure: CATARACT EXTRACTION PHACO AND INTRAOCULAR LENS PLACEMENT (IOC) LEFT, DIABETIC;  Surgeon: Birder Robson, MD;  Location: [facility] ORS;  Service: Ophthalmology;  Laterality: Left;  [place]  00:41 CDE 6.41 Fluid pack lot # [id number]  . COLONOSCOPY WITH PROPOFOL N/A 06/20/2017   Procedure: COLONOSCOPY WITH PROPOFOL;  Surgeon: Lollie Sails, MD;  Location: [facility] ENDOSCOPY;  Service: Endoscopy;  Laterality: N/A;  . ESOPHAGOGASTRODUODENOSCOPY (EGD) WITH PROPOFOL N/A 03/14/2015   Procedure: ESOPHAGOGASTRODUODENOSCOPY (EGD) WITH PROPOFOL;  Surgeon: Josefine Class, MD;  Location: [facility] ENDOSCOPY;  Service: Endoscopy;  Laterality: N/A;  . ESOPHAGOGASTRODUODENOSCOPY (EGD) WITH PROPOFOL N/A 03/28/2017   Procedure: ESOPHAGOGASTRODUODENOSCOPY (EGD) WITH PROPOFOL;  Surgeon: Lollie Sails, MD;  Location: [facility] ENDOSCOPY;  Service: Endoscopy;  Laterality: N/A;  . ESOPHAGOGASTRODUODENOSCOPY (EGD) WITH PROPOFOL N/A 06/20/2017   Procedure: ESOPHAGOGASTRODUODENOSCOPY (EGD) WITH PROPOFOL;  Surgeon:  Lollie Sails, MD;  Location: [facility] ENDOSCOPY;  Service: Endoscopy;  Laterality: N/A;  . ESOPHAGOGASTRODUODENOSCOPY (EGD) WITH PROPOFOL N/A 10/21/2017   Procedure: ESOPHAGOGASTRODUODENOSCOPY (EGD) WITH PROPOFOL;  Surgeon: Lollie Sails, MD;  Location: [facility] ENDOSCOPY;  Service: Endoscopy;  Laterality: N/A;  . ESOPHAGOGASTRODUODENOSCOPY (EGD) WITH PROPOFOL N/A 04/15/2018   Procedure: ESOPHAGOGASTRODUODENOSCOPY (EGD) WITH PROPOFOL;  Surgeon: Lollie Sails, MD;  Location: [facility] ENDOSCOPY;  Service: Endoscopy;  Laterality: N/A;  . ESOPHAGUS SURGERY     CLOSURE  . EYE SURGERY    . HYSTERECTOMY ABDOMINAL WITH SALPINGECTOMY    . IR GASTROSTOMY TUBE MOD SED  06/11/2018  . IR GASTROSTOMY TUBE REMOVAL  03/25/2019  . IR REPLACE G-TUBE SIMPLE WO FLUORO  10/19/2020  . PERIPHERAL VASCULAR CATHETERIZATION N/A 01/23/2016   Procedure: Visceral Venography;  Surgeon: Algernon Huxley, MD;  Location: [facility] CV LAB;  Service: Cardiovascular;  Laterality: N/A;  . PERIPHERAL VASCULAR CATHETERIZATION  01/23/2016   Procedure: Peripheral Vascular Intervention;  Surgeon: Algernon Huxley, MD;  Location: [facility] CV LAB;  Service: Cardiovascular;;  . UPPER ESOPHAGEAL ENDOSCOPIC ULTRASOUND (EUS) N/A 12/05/2017   Procedure: UPPER ESOPHAGEAL ENDOSCOPIC ULTRASOUND (EUS);  Surgeon: Jola Schmidt, MD;  Location: [facility] ENDOSCOPY;  Service: Endoscopy;  Laterality: N/A;  . VISCERAL ANGIOGRAPHY N/A 03/18/2017   Procedure: Visceral Angiography;  Surgeon: Algernon Huxley, MD;  Location: [place] CV LAB;  Service: Cardiovascular;  Laterality: N/A;  . VISCERAL ARTERY INTERVENTION N/A 03/18/2017   Procedure: Visceral Artery Intervention;  Surgeon: Algernon Huxley, MD;  Location: [facility] CV LAB;  Service: Cardiovascular;  Laterality: N/A;    Prior to Admission medications   Medication Sig Start Date End Date Taking? Authorizing Provider  acetaminophen (TYLENOL) 160 MG/5ML solution Place 20.3 mLs (650 mg total) into feeding tube  every 6 (six) hours as needed for mild pain. 06/13/18  Yes Demetrios Loll, MD  aspirin 81 MG chewable tablet Place 1 tablet (81 mg total) into feeding tube daily. 12/22/20  Yes Regalado, Belkys A, MD  carvedilol (COREG) 3.125 MG tablet Place 1 tablet (3.125 mg total) into feeding tube 2 (two) times daily with a meal. 12/22/20  Yes Regalado, Belkys A, MD  clopidogrel (PLAVIX) 75 MG tablet GIVE 1 TABLET VIA FEEDING TUBE EVERY [name] AS DIRECTED 12/30/20  Yes Love, Ivan Anchors, PA-C  furosemide (LASIX) 20 MG tablet Place 0.5 tablets (10 mg total) into feeding tube daily. 12/30/20  Yes Love, Ivan Anchors, PA-C  glipiZIDE (GLUCOTROL XL) 2.5 MG 24 hr tablet Take 2.5 mg by mouth at bedtime. 01/20/21 07/19/21 Yes [provider]  glipiZIDE (GLUCOTROL) 5 MG tablet Take 5 mg by mouth in the morning. 01/20/21 07/19/21 Yes [provider]  lisinopril (ZESTRIL) 2.5 MG tablet Take 1 tablet (2.5 mg total) by mouth daily. 12/31/20  Yes Love, Ivan Anchors, PA-C  metFORMIN (GLUCOPHAGE) 500 MG tablet Place 1 tablet (500 mg total) into feeding tube daily with breakfast. 12/31/20  Yes Love, Ivan Anchors, PA-C  Multiple Vitamin (MULTIVITAMIN) LIQD Place 15 mLs into feeding tube daily. 06/13/18  Yes Demetrios Loll, MD  pantoprazole sodium (PROTONIX) 40 mg/20 mL PACK Place 20 mLs (40 mg total) into feeding tube daily. 12/30/20  Yes Love, Ivan Anchors, PA-C  polyethylene glycol (MIRALAX / GLYCOLAX) 17 g packet Place 17 g into feeding tube daily as needed. 12/30/20  Yes Love, Ivan Anchors, PA-C  rOPINIRole (REQUIP) 2 MG tablet Place 1 tablet (2 mg total) into feeding tube 2 (two) times daily. 12/30/20  Yes Love, Ivan Anchors, PA-C  spironolactone (ALDACTONE) 25 MG tablet Take 25 mg by mouth in the morning. 02/01/21  Yes [provider]  hydrocerin (EUCERIN) CREA Apply 1 application topically 2 (two) times daily. To bilateral feet 12/27/20   Love, Ivan Anchors, PA-C  Hypromellose 0.2 % SOLN Place 1 drop into both eyes 3 (three) times daily as needed (dry eyes).      [provider]  Nutritional Supplements (FEEDING SUPPLEMENT, KATE FARMS STANDARD 1.4,) LIQD liquid Place 325 mLs into feeding tube 4 (four) times daily. 12/30/20   Love, Ivan Anchors, PA-C  polyvinyl alcohol (LIQUIFILM TEARS) 1.4 % ophthalmic solution Place 1 drop into both eyes as needed for dry eyes. 12/30/20   Love, Ivan Anchors, PA-C  Water For Irrigation, Sterile (FREE WATER) SOLN Place 100 mLs into feeding tube every 6 (six) hours. 12/30/20   Bary Leriche, PA-C    Family History  Problem Relation Age of Onset  . Stroke Mother   . Hypertension Mother   . Heart disease Mother   . Cancer Father   . Heart disease Father   . Heart attack Sister   . Diabetes Sister   . Heart attack Brother      Social History   Tobacco Use  . Smoking status: Never Smoker  . Smokeless tobacco: Never Used  Vaping Use  . Vaping Use: Never used  Substance  Use Topics  . Alcohol use: Never  . Drug use: Never    Allergies as of 02/15/2021 - Review Complete 02/15/2021  Allergen Reaction Noted  . Elemental sulfur Diarrhea and Nausea And Vomiting 03/11/2015  . Gabapentin Swelling 03/11/2015  . Lipitor [atorvastatin] Other (See Comments) 03/11/2015  . Mevacor [lovastatin] Other (See Comments) 03/11/2015  . Milk-related compounds Other (See Comments) 03/11/2015  . Septra [sulfamethoxazole-trimethoprim] Other (See Comments) 06/19/2017  . Statins Other (See Comments) 03/11/2015  . Zocor [simvastatin] Other (See Comments) 03/11/2015    Review of Systems:    All systems reviewed and negative except where noted in HPI.   Physical Exam:  Vital signs in last 24 hours: Vitals:   02/15/21 1530 02/15/21 1545 02/15/21 1600 02/15/21 1615  BP:   129/81   Pulse:  74 78 77  Resp: (!) 21 19 20 20   Temp:      TempSrc:      SpO2:  97% 100% 98%     General:   Pleasant, cooperative in NAD Head:  Normocephalic and atraumatic. Eyes:   No icterus.   Conjunctiva pink. PERRLA. Ears:  Normal auditory  acuity. Neck:  Supple; no masses or thyroidomegaly Lungs: Respirations even and unlabored. Lungs clear to auscultation bilaterally.   No wheezes, crackles, or rhonchi.  Abdomen:  Soft, nondistended, nontender. Normal bowel sounds. No appreciable masses or hepatomegaly.  No rebound or guarding.  Neurologic:  Alert and oriented x3;  grossly normal neurologically. Skin:  Intact without significant lesions or rashes. Cervical Nodes:  No significant cervical adenopathy. Psych:  Alert and cooperative. Normal affect.  LAB RESULTS: Recent Labs    02/15/21 1053 02/15/21 1333  WBC 11.7* 9.8  HGB 6.3* 6.0*  HCT 20.6* 19.8*  PLT 387 342   BMET Recent Labs    02/15/21 1053  NA 132*  K 6.1*  CL 99  CO2 22  GLUCOSE 256*  BUN 72*  CREATININE 1.50*  CALCIUM 8.5*   LFT No results for input(s): PROT, ALBUMIN, AST, ALT, ALKPHOS, BILITOT, BILIDIR, IBILI in the last 72 hours. PT/INR Recent Labs    02/15/21 1333  LABPROT 14.4  INR 1.1    STUDIES: DG Chest 2 View  Result Date: 02/15/2021 CLINICAL DATA:  Short of breath, leg swelling EXAM: CHEST - 2 VIEW COMPARISON:  12/17/2020 FINDINGS: Cardiac enlargement. Mild vascular congestion. Possible mild interstitial edema. Small bilateral pleural effusions. Negative for pneumonia. Atherosclerotic calcification aorta Thoracic disc degeneration and scoliosis.  ACDF lower cervical spine IMPRESSION: Cardiac enlargement with mild fluid overload. Electronically Signed   By: Franchot Gallo M.D.   On: 02/15/2021 11:53      Impression / Plan:   JAREE DWIGHT [name] is a 87 y.o. y/o female with presentation for shortness of breath, lower extremity edema with bilateral pleural effusions on x-ray, with GI being consulted for acute on chronic anemia with no evidence of active GI bleeding, receiving as needed IV iron transfusions per hematology as an outpatient, with Plavix use at home for history of peripheral arterial disease, celiac artery stent, chronic PEG  since 2019 due to severe esophageal stenosis requiring multiple dilations, history of CMV esophagitis with prolonged valacyclovir course in the past with history of acute stroke in March 2022  Patient does not have any evidence of active bleeding and has acute on chronic anemia  She usually gets iron transfusions as needed as an outpatient and has not seen hematology since January 2022  Given patient's multiple comorbidities including recent  stroke in March 2022, peripheral arterial disease, current bilateral pleural effusions and lower extremity edema, endoscopic procedures if needed, will need to be done with as much medical optimization as possible prior to endoscopy  Plavix will need to be held for 3 to 5 days prior to any endoscopic procedures  Continue medical optimization with PRBC transfusion as necessary, treatment of dyspnea, and fluid overload as per primary team  Given that last upper endoscopy in August 2019 had severe stenosis with scope unable to be passed, we might not be able to pass an endoscope for endoscopic visualization past the esophagus  Her colonoscopy in 2018 was incomplete due to significant tortuosity as reported by Dr. Gustavo Lah in his report.  Would recommend establishing goals of care with palliative care consult and if patient and family would like to proceed with further work-up including endoscopic procedures, this can be done after medical optimization and this discussion given that patient is requiring Plavix  PPI IV twice daily  Continue serial CBCs and transfuse PRN Avoid NSAIDs Maintain 2 large-bore IV lines Please page GI with any acute hemodynamic changes, or signs of active GI bleeding  Given the absence of any active GI bleeding, and other acute issues that need to be addressed such as shortness of breath and fluid overload, no indication for emergent endoscopy at this time  Follow-up pending labs of ferritin and iron and if indicated, proceed with  IV iron transfusions as an inpatient  Thank you for involving me in the care of this patient.      LOS: 0 days   Virgel Manifold, MD  02/15/2021, 4:26 PM

## 2021-02-15 NOTE — ED Notes (Signed)
Hospitalist Niu at bedside

## 2021-02-15 NOTE — ED Notes (Signed)
Informed RN bed assigned 

## 2021-02-15 NOTE — H&P (Signed)
History and Physical    [name] [name] [id number] DOB: December 26, 1928 DOA: 02/15/2021  Referring MD/NP/PA:   PCP: [name], MD   Patient coming from:  The patient is coming from home.  At baseline, pt is independent for most of ADL.        Chief Complaint: SOB  HPI: [name] is a 87 y.o. female with medical history significant of HTN, HLD, DM, stroke, GERD, esophageal stricture with feeding tube placement, iron deficiency anemia, pulm HTN, occlusive mesenteric ischemia, CHF with EF 35-40%, aortic stenosis, CKD-IIIa, who presents with shortness breath.  Pt state that she has SOB for more than 2 days, worse on exertion.  Patient does not have chest pain, fever or chills.  Patient has mild dry cough.  She also reports generalized weakness, leg edema and mild dizziness.  Patient has about 5 pounds of weight gain recently. She denies nausea, vomiting or abdominal pain.  Patient states that she has intermittent dark diarrhea due to tube feeding, which is chronic issue.  No symptoms of UTI.  ED Course: pt was found to have hemoglobin 6.3 (9.3 on 01/03/2021), WBC 11.7, pending FOBT, worsening renal function, temperature normal, soft blood pressure 99/50,, heart rate 73, RR 23, oxygen saturation 100% on room air.  Chest x-ray showed cardiomegaly and mild fluid overload.  Patient is admitted to [place] bed as inpatient.  Dr. Bonna Gains of GI is consulted.  Review of Systems:   General: no fevers, chills, has body weight gain, has fatigue HEENT: no blurry vision, hearing changes or sore throat Respiratory: has dyspnea, coughing, no wheezing CV: no chest pain, no palpitations GI: no nausea, vomiting, abdominal pain, has diarrhea, no constipation.  GU: no dysuria, burning on urination, increased urinary frequency, hematuria  Ext: has leg edema Neuro: no unilateral weakness, numbness, or tingling, no vision change or hearing loss Skin: no rash, no skin tear. MSK: No muscle spasm, no  deformity, no limitation of range of movement in spin Heme: No easy bruising.  Travel history: No recent long distant travel.  Allergy:  Allergies  Allergen Reactions  . Elemental Sulfur Diarrhea and Nausea And Vomiting  . Gabapentin Swelling  . Lipitor [Atorvastatin] Other (See Comments)    Muscle aches  . Mevacor [Lovastatin] Other (See Comments)    Muscle aches  . Milk-Related Compounds Other (See Comments)    Large quantities cause headaches   . Septra [Sulfamethoxazole-Trimethoprim] Other (See Comments)    Unknown  . Statins Other (See Comments)    Muscle pain  . Zocor [Simvastatin] Other (See Comments)    Muscle aches    Past Medical History:  Diagnosis Date  . Anemia    IRON INFUSIONS  . Aortic valve sclerosis   . Arrhythmia   . Arthritis    osteoarthritis  . Basal cell carcinoma of skin   . Brain tumor ([place])   . Brain tumor ([place])   . Cervical spine disease   . CHF (congestive heart failure) ([place])   . Diabetes mellitus without complication ([place])   . Dysrhythmia    sinus arrhythmia  . Esophageal stricture    severe, led to feeding tube placement in Sept 2019  . Esophageal ulcer without bleeding   . Gastrostomy tube dependent ([place])    DOES NOT EAT OR DRINK   . GERD (gastroesophageal reflux disease)   . History of kidney stones   . Hyperlipemia   . Hypertension   . Kidney stones   .  Leaky heart valve   . Meningioma ([place])   . Occlusive mesenteric ischemia ([place])   . Pulmonary hypertension ([place])   . RLS (restless legs syndrome)   . Vertigo     Past Surgical History:  Procedure Laterality Date  . ABDOMINAL HYSTERECTOMY    . APPENDECTOMY    . ARTHROGRAM KNEE Left   . BACK SURGERY     CERVIVAL NECK FUSION  . CATARACT EXTRACTION W/PHACO Left 11/11/2018   Procedure: CATARACT EXTRACTION PHACO AND INTRAOCULAR LENS PLACEMENT (IOC) LEFT, DIABETIC;  Surgeon: Birder Robson, MD;  Location: [facility] ORS;  Service: Ophthalmology;  Laterality: Left;  [place]  00:41 CDE  6.41 Fluid pack lot # [id number]  . COLONOSCOPY WITH PROPOFOL N/A 06/20/2017   Procedure: COLONOSCOPY WITH PROPOFOL;  Surgeon: Lollie Sails, MD;  Location: [facility] ENDOSCOPY;  Service: Endoscopy;  Laterality: N/A;  . ESOPHAGOGASTRODUODENOSCOPY (EGD) WITH PROPOFOL N/A 03/14/2015   Procedure: ESOPHAGOGASTRODUODENOSCOPY (EGD) WITH PROPOFOL;  Surgeon: Josefine Class, MD;  Location: [facility] ENDOSCOPY;  Service: Endoscopy;  Laterality: N/A;  . ESOPHAGOGASTRODUODENOSCOPY (EGD) WITH PROPOFOL N/A 03/28/2017   Procedure: ESOPHAGOGASTRODUODENOSCOPY (EGD) WITH PROPOFOL;  Surgeon: Lollie Sails, MD;  Location: [facility] ENDOSCOPY;  Service: Endoscopy;  Laterality: N/A;  . ESOPHAGOGASTRODUODENOSCOPY (EGD) WITH PROPOFOL N/A 06/20/2017   Procedure: ESOPHAGOGASTRODUODENOSCOPY (EGD) WITH PROPOFOL;  Surgeon: Lollie Sails, MD;  Location: [facility] ENDOSCOPY;  Service: Endoscopy;  Laterality: N/A;  . ESOPHAGOGASTRODUODENOSCOPY (EGD) WITH PROPOFOL N/A 10/21/2017   Procedure: ESOPHAGOGASTRODUODENOSCOPY (EGD) WITH PROPOFOL;  Surgeon: Lollie Sails, MD;  Location[facility]COPY;  Service: Endoscopy;  Laterality: N/A;  . ESOPHAGOGASTRODUODENOSCOPY (EGD) WITH PROPOFOL N/A 04/15/2018   Procedure: ESOPHAGOGASTRODUODENOSCOPY (EGD) WITH PROPOFOL;  Surgeon: Lollie Sails, MD;  Location: [facility] ENDOSCOPY;  Service: Endoscopy;  Laterality: N/A;  . ESOPHAGUS SURGERY     CLOSURE  . EYE SURGERY    . HYSTERECTOMY ABDOMINAL WITH SALPINGECTOMY    . IR GASTROSTOMY TUBE MOD SED  06/11/2018  . IR GASTROSTOMY TUBE REMOVAL  03/25/2019  . IR REPLACE G-TUBE SIMPLE WO FLUORO  10/19/2020  . PERIPHERAL VASCULAR CATHETERIZATION N/A 01/23/2016   Procedure: Visceral Venography;  Surgeon: Algernon Huxley, MD;  Location: [place] CV LAB;  Service: Cardiovascular;  Laterality: N/A;  . PERIPHERAL VASCULAR CATHETERIZATION  01/23/2016   Procedure: Peripheral Vascular Intervention;  Surgeon: Algernon Huxley, MD;  Location: [place] CV LAB;   Service: Cardiovascular;;  . UPPER ESOPHAGEAL ENDOSCOPIC ULTRASOUND (EUS) N/A 12/05/2017   Procedure: UPPER ESOPHAGEAL ENDOSCOPIC ULTRASOUND (EUS);  Surgeon: Jola Schmidt, MD;  Location: [facility] ENDOSCOPY;  Service: Endoscopy;  Laterality: N/A;  . VISCERAL ANGIOGRAPHY N/A 03/18/2017   Procedure: Visceral Angiography;  Surgeon: Algernon Huxley, MD;  Location: [place] CV LAB;  Service: Cardiovascular;  Laterality: N/A;  . VISCERAL ARTERY INTERVENTION N/A 03/18/2017   Procedure: Visceral Artery Intervention;  Surgeon: Algernon Huxley, MD;  Location: [place] CV LAB;  Service: Cardiovascular;  Laterality: N/A;    Social History:  reports that she has never smoked. She has never used smokeless tobacco. She reports that she does not drink alcohol and does not use drugs.  Family History:  Family History  Problem Relation Age of Onset  . Stroke Mother   . Hypertension Mother   . Heart disease Mother   . Cancer Father   . Heart disease Father   . Heart attack Sister   . Diabetes Sister   . Heart attack Brother      Prior to Admission medications  Medication Sig Start Date End Date Taking? Authorizing Provider  acetaminophen (TYLENOL) 160 MG/5ML solution Place 20.3 mLs (650 mg total) into feeding tube every 6 (six) hours as needed for mild pain. 06/13/18   Demetrios Loll, MD  aspirin 81 MG chewable tablet Place 1 tablet (81 mg total) into feeding tube daily. 12/22/20   Regalado, Belkys A, MD  carvedilol (COREG) 3.125 MG tablet Place 1 tablet (3.125 mg total) into feeding tube 2 (two) times daily with a meal. 12/22/20   Regalado, Belkys A, MD  clopidogrel (PLAVIX) 75 MG tablet GIVE 1 TABLET VIA FEEDING TUBE EVERY [name] AS DIRECTED 12/30/20   Love, Ivan Anchors, PA-C  furosemide (LASIX) 20 MG tablet Place 0.5 tablets (10 mg total) into feeding tube daily. 12/30/20   Love, Ivan Anchors, PA-C  hydrocerin (EUCERIN) CREA Apply 1 application topically 2 (two) times daily. To bilateral feet 12/27/20   Love, Ivan Anchors, PA-C   Hypromellose 0.2 % SOLN Place 1 drop into both eyes 3 (three) times daily as needed (dry eyes).     [provider]  lisinopril (ZESTRIL) 2.5 MG tablet Take 1 tablet (2.5 mg total) by mouth daily. 12/31/20   Love, Ivan Anchors, PA-C  metFORMIN (GLUCOPHAGE) 500 MG tablet Place 1 tablet (500 mg total) into feeding tube daily with breakfast. 12/31/20   Love, Ivan Anchors, PA-C  Multiple Vitamin (MULTIVITAMIN) LIQD Place 15 mLs into feeding tube daily. 06/13/18   Demetrios Loll, MD  Nutritional Supplements (FEEDING SUPPLEMENT, KATE FARMS STANDARD 1.4,) LIQD liquid Place 325 mLs into feeding tube 4 (four) times daily. 12/30/20   Love, Ivan Anchors, PA-C  pantoprazole sodium (PROTONIX) 40 mg/20 mL PACK Place 20 mLs (40 mg total) into feeding tube daily. 12/30/20   Love, Ivan Anchors, PA-C  polyethylene glycol (MIRALAX / GLYCOLAX) 17 g packet Place 17 g into feeding tube daily as needed. 12/30/20   Love, Ivan Anchors, PA-C  polyvinyl alcohol (LIQUIFILM TEARS) 1.4 % ophthalmic solution Place 1 drop into both eyes as needed for dry eyes. 12/30/20   Love, Ivan Anchors, PA-C  rOPINIRole (REQUIP) 2 MG tablet Place 1 tablet (2 mg total) into feeding tube 2 (two) times daily. 12/30/20   Love, Ivan Anchors, PA-C  Water For Irrigation, Sterile (FREE WATER) SOLN Place 100 mLs into feeding tube every 6 (six) hours. 12/30/20   Bary Leriche, PA-C    Physical Exam: Vitals:   02/15/21 1615 02/15/21 1724 02/15/21 1800 02/15/21 1845  BP:  (!) 126/56 (!) 139/53 121/79  Pulse: 77 80 81 80  Resp: 20 17 20  (!) 23  Temp:  97.8 F (36.6 C)  99 F (37.2 C)  TempSrc:  Oral  Oral  SpO2: 98% 97% 96% 97%   General: Not in acute distress HEENT:       Eyes: PERRL, EOMI, no scleral icterus.       ENT: No discharge from the ears and nose, no pharynx injection, no tonsillar enlargement.        Neck: No JVD, no bruit, no mass felt. Heme: No neck lymph node enlargement. Cardiac: S1/S2, RRR, No murmurs, No gallops or rubs. Respiratory: No rales, wheezing, rhonchi  or rubs. GI: Soft, nondistended, nontender, no rebound pain, no organomegaly, BS present. Has feeding tube in place GU: No hematuria Ext: has trace leg edema bilaterally. 1+DP/PT pulse bilaterally. Musculoskeletal: No joint deformities, No joint redness or warmth, no limitation of ROM in spin. Skin: No rashes.  Neuro: Alert, oriented X3, cranial nerves II-XII  grossly intact, moves all extremities normally. Psych: Patient is not psychotic, no suicidal or hemocidal ideation.  Labs on Admission: I have personally reviewed following labs and imaging studies  CBC: Recent Labs  Lab 02/15/21 1053 02/15/21 1333  WBC 11.7* 9.8  HGB 6.3* 6.0*  HCT 20.6* 19.8*  MCV 86.9 88.8  PLT 387 440   Basic Metabolic Panel: Recent Labs  Lab 02/15/21 1053  NA 132*  K 6.1*  CL 99  CO2 22  GLUCOSE 256*  BUN 72*  CREATININE 1.50*  CALCIUM 8.5*   GFR: CrCl cannot be calculated (Unknown ideal weight.). Liver Function Tests: No results for input(s): AST, ALT, ALKPHOS, BILITOT, PROT, ALBUMIN in the last 168 hours. No results for input(s): LIPASE, AMYLASE in the last 168 hours. No results for input(s): AMMONIA in the last 168 hours. Coagulation Profile: Recent Labs  Lab 02/15/21 1333  INR 1.1   Cardiac Enzymes: No results for input(s): CKTOTAL, CKMB, CKMBINDEX, TROPONINI in the last 168 hours. BNP (last 3 results) No results for input(s): PROBNP in the last 8760 hours. HbA1C: No results for input(s): HGBA1C in the last 72 hours. CBG: No results for input(s): GLUCAP in the last 168 hours. Lipid Profile: No results for input(s): CHOL, HDL, LDLCALC, TRIG, CHOLHDL, LDLDIRECT in the last 72 hours. Thyroid Function Tests: No results for input(s): TSH, T4TOTAL, FREET4, T3FREE, THYROIDAB in the last 72 hours. Anemia Panel: Recent Labs    02/15/21 1053 02/15/21 1221  FOLATE  --  52.0  FERRITIN  --  8*  TIBC  --  447  IRON  --  19*  RETICCTPCT 4.0*  --    Urine analysis:    Component  Value Date/Time   COLORURINE YELLOW (A) 12/21/2020 1550   APPEARANCEUR HAZY (A) 12/21/2020 1550   LABSPEC 1.015 12/21/2020 1550   PHURINE 7.0 12/21/2020 1550   GLUCOSEU NEGATIVE 12/21/2020 1550   HGBUR SMALL (A) 12/21/2020 1550   BILIRUBINUR NEGATIVE 12/21/2020 1550   KETONESUR NEGATIVE 12/21/2020 1550   PROTEINUR 30 (A) 12/21/2020 1550   NITRITE POSITIVE (A) 12/21/2020 1550   LEUKOCYTESUR LARGE (A) 12/21/2020 1550   Sepsis Labs: @LABRCNTIP (procalcitonin:4,lacticidven:4) ) Recent Results (from the past 240 hour(s))  Resp Panel by RT-PCR (Flu A&B, Covid) Nasopharyngeal Swab     Status: None   Collection Time: 02/15/21 12:48 PM   Specimen: Nasopharyngeal Swab; Nasopharyngeal(NP) swabs in vial transport medium  Result Value Ref Range Status   SARS Coronavirus 2 by RT PCR NEGATIVE NEGATIVE Final    Comment: (NOTE) SARS-CoV-2 target nucleic acids are NOT DETECTED.  The SARS-CoV-2 RNA is generally detectable in upper respiratory specimens during the acute phase of infection. The lowest concentration of SARS-CoV-2 viral copies this assay can detect is 138 copies/mL. A negative result does not preclude SARS-Cov-2 infection and should not be used as the sole basis for treatment or other patient management decisions. A negative result may occur with  improper specimen collection/handling, submission of specimen other than nasopharyngeal swab, presence of viral mutation(s) within the areas targeted by this assay, and inadequate number of viral copies(<138 copies/mL). A negative result must be combined with clinical observations, patient history, and epidemiological information. The expected result is Negative.  Fact Sheet for Patients:  EntrepreneurPulse.com.au  Fact Sheet for Healthcare Providers:  IncredibleEmployment.be  This test is no t yet approved or cleared by the [place] FDA and  has been authorized for detection and/or diagnosis of  SARS-CoV-2 by FDA under an Emergency Use Authorization (EUA).  This EUA will remain  in effect (meaning this test can be used) for the duration of the COVID-19 declaration under Section 564(b)(1) of the Act, 21 U.S.C.section 360bbb-3(b)(1), unless the authorization is terminated  or revoked sooner.       Influenza A by PCR NEGATIVE NEGATIVE Final   Influenza B by PCR NEGATIVE NEGATIVE Final    Comment: (NOTE) The Xpert Xpress SARS-CoV-2/FLU/RSV plus assay is intended as an aid in the diagnosis of influenza from Nasopharyngeal swab specimens and should not be used as a sole basis for treatment. Nasal washings and aspirates are unacceptable for Xpert Xpress SARS-CoV-2/FLU/RSV testing.  Fact Sheet for Patients: EntrepreneurPulse.com.au  Fact Sheet for Healthcare Providers: IncredibleEmployment.be  This test is not yet approved or cleared by the [place] FDA and has been authorized for detection and/or diagnosis of SARS-CoV-2 by FDA under an Emergency Use Authorization (EUA). This EUA will remain in effect (meaning this test can be used) for the duration of the COVID-19 declaration under Section 564(b)(1) of the Act, 21 U.S.C. section 360bbb-3(b)(1), unless the authorization is terminated or revoked.  Performed at [facility], [address], [place], [place] [address]      Radiological Exams on Admission: DG Chest 2 View  Result Date: 02/15/2021 CLINICAL DATA:  Short of breath, leg swelling EXAM: CHEST - 2 VIEW COMPARISON:  12/17/2020 FINDINGS: Cardiac enlargement. Mild vascular congestion. Possible mild interstitial edema. Small bilateral pleural effusions. Negative for pneumonia. Atherosclerotic calcification aorta Thoracic disc degeneration and scoliosis.  ACDF lower cervical spine IMPRESSION: Cardiac enlargement with mild fluid overload. Electronically Signed   By: Franchot Gallo M.D.   On: 02/15/2021 11:53     EKG: I have  personally reviewed.  Sinus rhythm, QTC 467, T wave inversion in lateral leads, ST depression in the inferior leads and V3-V6.   Assessment/Plan Principal Problem:   Symptomatic anemia Active Problems:   Iron deficiency anemia   Esophageal stricture   GERD (gastroesophageal reflux disease)   Type II diabetes mellitus with renal manifestations (HCC)   HTN (hypertension)   Stroke (HCC)   Hyperkalemia   Acute renal failure superimposed on stage 3a chronic kidney disease (HCC)   Chronic combined systolic and diastolic congestive heart failure (HCC)   Symptomatic anemia and hx of iron deficiency anemia: Per her daughter, patient is getting IV iron infusion intermittently.  Her hemoglobin dropped from 9.3 to 6.3.  Maybe due to occult GI blood loss.  Dr. Bonna Gains of GI is consulted.  - will admitted to med-surg bed as inpatient -Hold aspirin, Plavix - transfuse 1 units of blood now - IVF: 1L NS bolus - Start IV pantoprazole 40 mg bid - Zofran IV for nausea - Avoid NSAIDs and SQ heparin - Maintain IV access (2 large bore IVs if possible). - Monitor closely and follow q6h cbc, transfuse as necessary, if Hgb<7.0 - LaB: INR, PTT and type screen, anemia panel  Chronic combined systolic and diastolic congestive heart failure: 2D echo on 12/18/2020 showed EF 35-40% with grade 2 diastolic dysfunction.  Patient has trace leg edema.  Chest x-ray showed mild fluid overload.  Patient's blood pressures are soft, cannot treat with aggressive diuresis. -Will give 20 mg of Lasix during blood transfusion -Hold spironolactone due to softer blood pressure  Esophageal stricture -Continue feeding tube  GERD (gastroesophageal reflux disease) - on IV Protonix  Type II diabetes mellitus with renal manifestations [facility] Recent A1c 6.9, well controlled.  Patient taking glipizide and metformin -SSI  HTN (  hypertension): Blood pressure soft -Hold Lasix, lisinopril, spironolactone, Coreg -Continue  Coreg  Stroke (HCC) -Hold aspirin, Plavix  Hyperkalemia: Potassium 6.1. -Treated with D50, calcium gluconate, 5 units of NovoLog -Leukoma 10 g  Acute renal failure superimposed on stage 3a chronic kidney disease ([place]): Baseline creatinine 0.80-1.0.  Her creatinine is 1.50, BUN 72, likely due to dehydration and continuation of diuretics and lisinopril. -Hold Lasix, lisinopril, spironolactone -Follow-up by BMP -IV fluid as above      DVT ppx: SCD Code Status: Full code per pt and her daughtger Family Communication:  Yes, patient's daughter    at bed side Disposition Plan:  Anticipate discharge back to previous environment Consults called: Dr. Bonna Gains of GI Admission status and Level of care: Progressive Cardiac:  for obs   Status is: Inpatient  Remains inpatient appropriate because:Inpatient level of care appropriate due to severity of illness   Dispo: The patient is from: Home              Anticipated d/c is to: Home              Patient currently is not medically stable to d/c.   Difficult to place patient No          Date of Service 02/15/2021    Ivor Costa [facility]   If 7PM-7AM, please contact night-coverage [email or web address] 02/15/2021, 6:49 PM

## 2021-02-15 NOTE — ED Triage Notes (Signed)
Pt comes into the ED via EMS from home with c/o exertional SOB with 5lb wt gain .   129/78 79HR 100%RA

## 2021-02-15 NOTE — ED Notes (Signed)
Report off to rachel rn cpod room 33.

## 2021-02-15 NOTE — ED Notes (Signed)
Resumed care from [facility]. Pt alert.  Pt receiving unit of prbc's.  Family with pt.  Pt in no acute distress.

## 2021-02-15 NOTE — ED Provider Notes (Signed)
[facility] Emergency Department Provider Note   ____________________________________________   Event Date/Time   First MD Initiated Contact with Patient 02/15/21 1146     (approximate)  I have reviewed the triage vital signs and the nursing notes.   HISTORY  Chief Complaint Shortness of Breath    HPI [name] Day is a 87 y.o. female with below stated past medical history who presents via EMS from home complaining of shortness of breath and generalized weakness over the past 1 week.  Patient states that she had been feeling increasing shortness of breath and dizziness upon exertion that culminated today during a physical therapy appointment in which she became extremely short of breath with any exercises and was recommended to present to the emergency department.  Patient also endorses associated bilateral lower extremity edema that has been increasing over the past 2 days.  Patient currently denies any vision changes, tinnitus, difficulty speaking, facial droop, sore throat, chest pain,  abdominal pain, nausea/vomiting/diarrhea, dysuria, or weakness/numbness/paresthesias in any extremity         Past Medical History:  Diagnosis Date  . Anemia    IRON INFUSIONS  . Aortic valve sclerosis   . Arrhythmia   . Arthritis    osteoarthritis  . Basal cell carcinoma of skin   . Brain tumor ([place])   . Brain tumor ([place])   . Cervical spine disease   . CHF (congestive heart failure) ([place])   . Diabetes mellitus without complication ([place])   . Dysrhythmia    sinus arrhythmia  . Esophageal stricture    severe, led to feeding tube placement in Sept 2019  . Esophageal ulcer without bleeding   . Gastrostomy tube dependent ([place])    DOES NOT EAT OR DRINK   . GERD (gastroesophageal reflux disease)   . History of kidney stones   . Hyperlipemia   . Hypertension   . Kidney stones   . Leaky heart valve   . Meningioma ([place])   . Occlusive mesenteric ischemia ([place])    . Pulmonary hypertension ([place])   . RLS (restless legs syndrome)   . Vertigo     Patient Active Problem List   Diagnosis Date Noted  . Abnormality of gait 01/19/2021  . Malnutrition of moderate degree 12/29/2020  . Pressure injury of skin 12/29/2020  . Anemia of chronic disease   . Essential hypertension   . Controlled type 2 diabetes mellitus with hyperglycemia, without long-term current use of insulin ([place])   . Hypoalbuminemia due to protein-calorie malnutrition ([place])   . Stroke (cerebrum) ([place]) 12/22/2020  . Abdominal pain, lower   . Cerebrovascular accident (CVA) due to occlusion of left middle cerebral artery ([place])   . Right sided weakness   . Weakness   . Respiratory failure with hypoxia ([place]) 11/23/2019  . CHF exacerbation ([place]) 11/23/2019  . HFrEF (heart failure with reduced ejection fraction) ([place]) 11/23/2019  . GERD (gastroesophageal reflux disease)   . Gastrostomy tube dependent ([place])   . Esophageal ulcer without bleeding   . Diabetes mellitus without complication ([place])   . Anemia   . Aortic valve disease 08/24/2019  . Gastrostomy tube in place ([place]) 07/31/2019  . RLS (restless legs syndrome) 07/31/2019  . Chest pain, atypical 03/18/2019  . Tricuspid regurgitation 02/13/2019  . Heart palpitations 02/12/2019  . Hyponatremia 06/23/2018  . Esophageal stricture 06/11/2018  . Weight loss 05/29/2018  . Dysphagia 05/29/2018  . Esophageal stenosis 05/05/2018  . Hyperlipemia 04/04/2018  . Itching  01/08/2018  . Medication monitoring encounter 01/08/2018  . Paronychia of toe 01/08/2018  . Esophagitis, CMV ([place]) 01/01/2018  . Occlusive mesenteric ischemia ([place]) 05/28/2017  . Hypertension 04/30/2017  . Mesenteric ischemia ([place]) 03/01/2017  . Iron deficiency anemia 02/28/2017  . PAD (peripheral artery disease) ([place]) 02/05/2017  . Hyperlipidemia type II 04/04/2016  . Pulmonary hypertension ([place]) 01/04/2015  . Generalized OA 12/13/2014  . Leg edema 12/13/2014  .  Aortic valve sclerosis 02/19/2014  . Type 2 diabetes mellitus with hyperlipidemia ([place]) 12/25/2013  . Acute, but ill-defined, cerebrovascular disease 12/25/2013  . Benign essential hypertension 12/25/2013  . Unilateral primary osteoarthritis, unspecified knee 12/25/2013  . Personal history of other malignant neoplasm of skin 05/28/2012    Past Surgical History:  Procedure Laterality Date  . ABDOMINAL HYSTERECTOMY    . APPENDECTOMY    . ARTHROGRAM KNEE Left   . BACK SURGERY     CERVIVAL NECK FUSION  . CATARACT EXTRACTION W/PHACO Left 11/11/2018   Procedure: CATARACT EXTRACTION PHACO AND INTRAOCULAR LENS PLACEMENT (IOC) LEFT, DIABETIC;  Surgeon: Birder Robson, MD;  Location: [facility] ORS;  Service: Ophthalmology;  Laterality: Left;  [place]  00:41 CDE 6.41 Fluid pack lot # [id number]  . COLONOSCOPY WITH PROPOFOL N/A 06/20/2017   Procedure: COLONOSCOPY WITH PROPOFOL;  Surgeon: Lollie Sails, MD;  Location: [facility] ENDOSCOPY;  Service: Endoscopy;  Laterality: N/A;  . ESOPHAGOGASTRODUODENOSCOPY (EGD) WITH PROPOFOL N/A 03/14/2015   Procedure: ESOPHAGOGASTRODUODENOSCOPY (EGD) WITH PROPOFOL;  Surgeon: Josefine Class, MD;  Location: [facility] ENDOSCOPY;  Service: Endoscopy;  Laterality: N/A;  . ESOPHAGOGASTRODUODENOSCOPY (EGD) WITH PROPOFOL N/A 03/28/2017   Procedure: ESOPHAGOGASTRODUODENOSCOPY (EGD) WITH PROPOFOL;  Surgeon: Lollie Sails, MD;  Location: [facility] ENDOSCOPY;  Service: Endoscopy;  Laterality: N/A;  . ESOPHAGOGASTRODUODENOSCOPY (EGD) WITH PROPOFOL N/A 06/20/2017   Procedure: ESOPHAGOGASTRODUODENOSCOPY (EGD) WITH PROPOFOL;  Surgeon: Lollie Sails, MD;  Location: [facility] ENDOSCOPY;  Service: Endoscopy;  Laterality: N/A;  . ESOPHAGOGASTRODUODENOSCOPY (EGD) WITH PROPOFOL N/A 10/21/2017   Procedure: ESOPHAGOGASTRODUODENOSCOPY (EGD) WITH PROPOFOL;  Surgeon: Lollie Sails, MD;  Location: [facility] ENDOSCOPY;  Service: Endoscopy;  Laterality: N/A;  . ESOPHAGOGASTRODUODENOSCOPY (EGD) WITH PROPOFOL  N/A 04/15/2018   Procedure: ESOPHAGOGASTRODUODENOSCOPY (EGD) WITH PROPOFOL;  Surgeon: Lollie Sails, MD;  Location: [facility] ENDOSCOPY;  Service: Endoscopy;  Laterality: N/A;  . ESOPHAGUS SURGERY     CLOSURE  . EYE SURGERY    . HYSTERECTOMY ABDOMINAL WITH SALPINGECTOMY    . IR GASTROSTOMY TUBE MOD SED  06/11/2018  . IR GASTROSTOMY TUBE REMOVAL  03/25/2019  . IR REPLACE G-TUBE SIMPLE WO FLUORO  10/19/2020  . PERIPHERAL VASCULAR CATHETERIZATION N/A 01/23/2016   Procedure: Visceral Venography;  Surgeon: Algernon Huxley, MD;  Location: [place] CV LAB;  Service: Cardiovascular;  Laterality: N/A;  . PERIPHERAL VASCULAR CATHETERIZATION  01/23/2016   Procedure: Peripheral Vascular Intervention;  Surgeon: Algernon Huxley, MD;  Location: [place] CV LAB;  Service: Cardiovascular;;  . UPPER ESOPHAGEAL ENDOSCOPIC ULTRASOUND (EUS) N/A 12/05/2017   Procedure: UPPER ESOPHAGEAL ENDOSCOPIC ULTRASOUND (EUS);  Surgeon: Jola Schmidt, MD;  Location: [facility] ENDOSCOPY;  Service: Endoscopy;  Laterality: N/A;  . VISCERAL ANGIOGRAPHY N/A 03/18/2017   Procedure: Visceral Angiography;  Surgeon: Algernon Huxley, MD;  Location: [place] CV LAB;  Service: Cardiovascular;  Laterality: N/A;  . VISCERAL ARTERY INTERVENTION N/A 03/18/2017   Procedure: Visceral Artery Intervention;  Surgeon: Algernon Huxley, MD;  Location: [place] CV LAB;  Service: Cardiovascular;  Laterality: N/A;    Prior to Admission medications  Medication Sig Start Date End Date Taking? Authorizing Provider  acetaminophen (TYLENOL) 160 MG/5ML solution Place 20.3 mLs (650 mg total) into feeding tube every 6 (six) hours as needed for mild pain. 06/13/18   Demetrios Loll, MD  aspirin 81 MG chewable tablet Place 1 tablet (81 mg total) into feeding tube daily. 12/22/20   Regalado, Belkys A, MD  carvedilol (COREG) 3.125 MG tablet Place 1 tablet (3.125 mg total) into feeding tube 2 (two) times daily with a meal. 12/22/20   Regalado, Belkys A, MD  clopidogrel (PLAVIX) 75  MG tablet GIVE 1 TABLET VIA FEEDING TUBE EVERY DAY AS DIRECTED 12/30/20   Love, Ivan Anchors, PA-C  furosemide (LASIX) 20 MG tablet Place 0.5 tablets (10 mg total) into feeding tube daily. 12/30/20   Love, Ivan Anchors, PA-C  hydrocerin (EUCERIN) CREA Apply 1 application topically 2 (two) times daily. To bilateral feet 12/27/20   Love, Ivan Anchors, PA-C  Hypromellose 0.2 % SOLN Place 1 drop into both eyes 3 (three) times daily as needed (dry eyes).     [provider]  lisinopril (ZESTRIL) 2.5 MG tablet Take 1 tablet (2.5 mg total) by mouth daily. 12/31/20   Love, Ivan Anchors, PA-C  metFORMIN (GLUCOPHAGE) 500 MG tablet Place 1 tablet (500 mg total) into feeding tube daily with breakfast. 12/31/20   Love, Ivan Anchors, PA-C  Multiple Vitamin (MULTIVITAMIN) LIQD Place 15 mLs into feeding tube daily. 06/13/18   Demetrios Loll, MD  Nutritional Supplements (FEEDING SUPPLEMENT, KATE FARMS STANDARD 1.4,) LIQD liquid Place 325 mLs into feeding tube 4 (four) times daily. 12/30/20   Love, Ivan Anchors, PA-C  pantoprazole sodium (PROTONIX) 40 mg/20 mL PACK Place 20 mLs (40 mg total) into feeding tube daily. 12/30/20   Love, Ivan Anchors, PA-C  polyethylene glycol (MIRALAX / GLYCOLAX) 17 g packet Place 17 g into feeding tube daily as needed. 12/30/20   Love, Ivan Anchors, PA-C  polyvinyl alcohol (LIQUIFILM TEARS) 1.4 % ophthalmic solution Place 1 drop into both eyes as needed for dry eyes. 12/30/20   Love, Ivan Anchors, PA-C  rOPINIRole (REQUIP) 2 MG tablet Place 1 tablet (2 mg total) into feeding tube 2 (two) times daily. 12/30/20   Love, Ivan Anchors, PA-C  Water For Irrigation, Sterile (FREE WATER) SOLN Place 100 mLs into feeding tube every 6 (six) hours. 12/30/20   Love, Ivan Anchors, PA-C    Allergies Elemental sulfur, Gabapentin, Lipitor [atorvastatin], Mevacor [lovastatin], Milk-related compounds, Septra [sulfamethoxazole-trimethoprim], Statins, and Zocor [simvastatin]  Family History  Problem Relation Age of Onset  . Stroke Mother   . Hypertension Mother    . Heart disease Mother   . Cancer Father   . Heart disease Father   . Heart attack Sister   . Diabetes Sister   . Heart attack Brother     Social History Social History   Tobacco Use  . Smoking status: Never Smoker  . Smokeless tobacco: Never Used  Vaping Use  . Vaping Use: Never used  Substance Use Topics  . Alcohol use: Never  . Drug use: Never    Review of Systems Constitutional: No fever/chills Eyes: No visual changes. ENT: No sore throat. Cardiovascular: Denies chest pain. Respiratory: Endorses shortness of breath. Gastrointestinal: No abdominal pain.  No nausea, no vomiting.  No diarrhea. Genitourinary: Negative for dysuria. Musculoskeletal: Negative for acute arthralgias Skin: Negative for rash.  Bilateral lower extremity edema Neurological: Negative for headaches, weakness/numbness/paresthesias in any extremity Psychiatric: Negative for suicidal ideation/homicidal ideation   ____________________________________________  PHYSICAL EXAM:  VITAL SIGNS: ED Triage Vitals  Enc Vitals Group     BP 02/15/21 1050 (!) 99/50     Pulse Rate 02/15/21 1050 77     Resp 02/15/21 1050 19     Temp 02/15/21 1050 97.9 F (36.6 C)     Temp Source 02/15/21 1050 Oral     SpO2 02/15/21 1050 100 %     Weight --      Height --      Head Circumference --      Peak Flow --      Pain Score 02/15/21 1051 0     Pain Loc --      Pain Edu? --      Excl. in [place]? --    Constitutional: Alert and oriented. Well appearing and in no acute distress. Eyes: Conjunctivae are pallid. PERRL. Head: Atraumatic. Nose: No congestion/rhinnorhea. Mouth/Throat: Mucous membranes are moist. Neck: No stridor Cardiovascular: Grossly normal heart sounds.  Good peripheral circulation. Respiratory: Tachypnea. Normal respiratory effort.  No retractions. Gastrointestinal: Soft and nontender. No distention. Musculoskeletal: No obvious deformities Neurologic:  Normal speech and language. No gross  focal neurologic deficits are appreciated. Skin:  Skin is warm and dry. No rash noted.  Generalized pallor Psychiatric: Mood and affect are normal. Speech and behavior are normal.  ____________________________________________   LABS (all labs ordered are listed, but only abnormal results are displayed)  Labs Reviewed  CBC - Abnormal; Notable for the following components:      Result Value   WBC 11.7 (*)    RBC 2.37 (*)    Hemoglobin 6.3 (*)    HCT 20.6 (*)    All other components within normal limits  BASIC METABOLIC PANEL - Abnormal; Notable for the following components:   Sodium 132 (*)    Potassium 6.1 (*)    Glucose, Bld 256 (*)    BUN 72 (*)    Creatinine, Ser 1.50 (*)    Calcium 8.5 (*)    GFR, Estimated 33 (*)    All other components within normal limits  PREPARE RBC (CROSSMATCH)   ____________________________________________  EKG  ED ECG REPORT I, Naaman Plummer, the attending physician, personally viewed and interpreted this ECG.  Date: 02/15/2021 EKG Time: 1056 Rate: 82 Rhythm: normal sinus rhythm QRS Axis: normal Intervals: normal ST/T Wave abnormalities: normal Narrative Interpretation: no evidence of acute ischemia  ____________________________________________  RADIOLOGY  ED MD interpretation: Chest x-ray shows cardiac enlargement and mild fluid overload  Official radiology report(s): DG Chest 2 View  Result Date: 02/15/2021 CLINICAL DATA:  Short of breath, leg swelling EXAM: CHEST - 2 VIEW COMPARISON:  12/17/2020 FINDINGS: Cardiac enlargement. Mild vascular congestion. Possible mild interstitial edema. Small bilateral pleural effusions. Negative for pneumonia. Atherosclerotic calcification aorta Thoracic disc degeneration and scoliosis.  ACDF lower cervical spine IMPRESSION: Cardiac enlargement with mild fluid overload. Electronically Signed   By: Franchot Gallo M.D.   On: 02/15/2021 11:53     ____________________________________________   PROCEDURES  Procedure(s) performed (including Critical Care):  .1-3 Lead EKG Interpretation Performed by: Naaman Plummer, MD Authorized by: Naaman Plummer, MD     Interpretation: normal     ECG rate:  72   ECG rate assessment: normal     Rhythm: sinus rhythm     Ectopy: none     Conduction: normal       ____________________________________________   INITIAL IMPRESSION / ASSESSMENT AND PLAN / ED COURSE  As part of  my medical decision making, I reviewed the following data within the [place] notes reviewed and incorporated, Labs reviewed, EKG interpreted, Old chart reviewed, Radiograph reviewed and Notes from prior ED visits reviewed and incorporated     87 year old female with the above-stated past medical history presents for worsening shortness of breath and dyspnea on exertion over the last 2 weeks. Differential diagnosis includes but is not limited to: Symptomatic anemia, GI bleed, iron deficiency anemia, CHF exacerbation, ACS  Laboratory evaluation significant for: Creatinine 1.5, potassium 6.1, BUN 7.2 H/H 6.3/20.6  Radiologic evaluation significant for: Chest x-ray showing cardiomegaly and bilateral pulmonary edema  Given these constellation of symptoms and findings, patient will require admission to the internal medicine service for further evaluation and management.      ____________________________________________   FINAL CLINICAL IMPRESSION(S) / ED DIAGNOSES  Final diagnoses:  Symptomatic anemia  AKI (acute kidney injury) ([place])  SOB (shortness of breath)     ED Discharge Orders    None       Note:  This document was prepared using Dragon voice recognition software and may include unintentional dictation errors.   Naaman Plummer, MD 02/15/21 [phone]

## 2021-02-15 NOTE — ED Triage Notes (Signed)
Pt comes via EMs from home with c/o SOB for few days and bilateral leg swelling. Pt states SOB with exertion and some weight gain.  Pt denies any CP

## 2021-02-16 ENCOUNTER — Encounter: Payer: Self-pay | Admitting: Internal Medicine

## 2021-02-16 DIAGNOSIS — Z515 Encounter for palliative care: Secondary | ICD-10-CM | POA: Diagnosis not present

## 2021-02-16 DIAGNOSIS — D649 Anemia, unspecified: Secondary | ICD-10-CM | POA: Diagnosis not present

## 2021-02-16 DIAGNOSIS — D500 Iron deficiency anemia secondary to blood loss (chronic): Secondary | ICD-10-CM

## 2021-02-16 DIAGNOSIS — N179 Acute kidney failure, unspecified: Secondary | ICD-10-CM | POA: Diagnosis not present

## 2021-02-16 DIAGNOSIS — Z7189 Other specified counseling: Secondary | ICD-10-CM

## 2021-02-16 DIAGNOSIS — K222 Esophageal obstruction: Secondary | ICD-10-CM | POA: Diagnosis not present

## 2021-02-16 DIAGNOSIS — K219 Gastro-esophageal reflux disease without esophagitis: Secondary | ICD-10-CM | POA: Diagnosis not present

## 2021-02-16 LAB — CBC
HCT: 23.1 % — ABNORMAL LOW (ref 36.0–46.0)
HCT: 24.3 % — ABNORMAL LOW (ref 36.0–46.0)
Hemoglobin: 7.4 g/dL — ABNORMAL LOW (ref 12.0–15.0)
Hemoglobin: 7.8 g/dL — ABNORMAL LOW (ref 12.0–15.0)
MCH: 27.6 pg (ref 26.0–34.0)
MCH: 27.9 pg (ref 26.0–34.0)
MCHC: 32 g/dL (ref 30.0–36.0)
MCHC: 32.1 g/dL (ref 30.0–36.0)
MCV: 85.9 fL (ref 80.0–100.0)
MCV: 87.2 fL (ref 80.0–100.0)
Platelets: 342 10*3/uL (ref 150–400)
Platelets: 342 10*3/uL (ref 150–400)
RBC: 2.65 MIL/uL — ABNORMAL LOW (ref 3.87–5.11)
RBC: 2.83 MIL/uL — ABNORMAL LOW (ref 3.87–5.11)
RDW: 14.6 % (ref 11.5–15.5)
RDW: 14.7 % (ref 11.5–15.5)
WBC: 10.2 10*3/uL (ref 4.0–10.5)
WBC: 11.5 10*3/uL — ABNORMAL HIGH (ref 4.0–10.5)
nRBC: 0 % (ref 0.0–0.2)
nRBC: 0 % (ref 0.0–0.2)

## 2021-02-16 LAB — TYPE AND SCREEN
ABO/RH(D): A POS
Antibody Screen: NEGATIVE
Unit division: 0

## 2021-02-16 LAB — BPAM RBC
Blood Product Expiration Date: 202206122359
ISSUE DATE / TIME: 202205181427
Unit Type and Rh: 6200

## 2021-02-16 LAB — GLUCOSE, CAPILLARY
Glucose-Capillary: 201 mg/dL — ABNORMAL HIGH (ref 70–99)
Glucose-Capillary: 189 mg/dL — ABNORMAL HIGH (ref 70–99)
Glucose-Capillary: 334 mg/dL — ABNORMAL HIGH (ref 70–99)
Glucose-Capillary: 160 mg/dL — ABNORMAL HIGH (ref 70–99)

## 2021-02-16 LAB — BASIC METABOLIC PANEL
Anion gap: 10 (ref 5–15)
BUN: 64 mg/dL — ABNORMAL HIGH (ref 8–23)
CO2: 22 mmol/L (ref 22–32)
Calcium: 8.2 mg/dL — ABNORMAL LOW (ref 8.9–10.3)
Chloride: 102 mmol/L (ref 98–111)
Creatinine, Ser: 1.4 mg/dL — ABNORMAL HIGH (ref 0.44–1.00)
GFR, Estimated: 36 mL/min — ABNORMAL LOW (ref >60)
Glucose, Bld: 115 mg/dL — ABNORMAL HIGH (ref 70–99)
Potassium: 4.7 mmol/L (ref 3.5–5.1)
Sodium: 134 mmol/L — ABNORMAL LOW (ref 135–145)

## 2021-02-16 MED ORDER — IPRATROPIUM-ALBUTEROL 0.5-2.5 (3) MG/3ML IN SOLN: 3.0000 mL | RESPIRATORY_TRACT | Status: DC

## 2021-02-16 MED ORDER — FREE WATER
120.0000 mL | Freq: Four times a day (QID) | Status: DC
  Administered 2021-02-16 – 2021-02-17 (×3): 120 mL

## 2021-02-16 MED ORDER — IPRATROPIUM-ALBUTEROL 0.5-2.5 (3) MG/3ML IN SOLN
3.0000 mL | Freq: Four times a day (QID) | RESPIRATORY_TRACT | Status: DC
  Administered 2021-02-16 – 2021-02-17 (×2): 3 mL via RESPIRATORY_TRACT
  Filled 2021-02-16 (×3): qty 3

## 2021-02-16 MED ORDER — SODIUM CHLORIDE 0.9 % IV SOLN
200.0000 mg | Freq: Once | INTRAVENOUS | Status: AC
  Administered 2021-02-16: 200 mg via INTRAVENOUS
  Filled 2021-02-16: qty 10

## 2021-02-16 NOTE — Progress Notes (Signed)
Initial Nutrition Assessment  DOCUMENTATION CODES:   Non-severe (moderate) malnutrition in context of chronic illness  INTERVENTION:   [employer] 1.4 Standard formula- 4 cans daily via tube- Flush tube with 27m of water before and after each feed  Regimen provides 1820kcal/day, 80g/day protein and 14148mday free water   NUTRITION DIAGNOSIS:   Moderate Malnutrition related to chronic illness (dysphagia, CHF, CKD III) as evidenced by mild fat depletion,mild muscle depletion,moderate muscle depletion.  GOAL:   Patient will meet greater than or equal to 90% of their needs  MONITOR:   Labs,Weight trends,Skin,I & O's,TF tolerance  REASON FOR ASSESSMENT:   Other (Comment) (Nutrition Support Patient)    ASSESSMENT:   [age].o. female with medical history significant of HTN, HLD, DM, stroke, GERD, esophageal stricture with feeding tube placement, iron deficiency anemia, pulm HTN, occlusive mesenteric ischemia, CHF with EF 35-40%, aortic stenosis and CKD-IIIa who presents with shortness breath and anemia   Pt s/p IR G-tube placement since 2019  Met with pt and pt's daughter in room today. Pt is well known to this RD from multiple previous admissions. Pt with intolerance to Jevity and Osmolite. Pt [facility] [place] Pt reports that she is tolerating the [facility] better than the standard formulas. Pt reports occasional diarrhea but reports that this is significantly improved with the KaCostco WholesalePt reports that she is doing 3-4 cartons a day depending on how she feels along with 1 bottle of Pedialyte. Pt is no longer using any protein modulars. Daughter reports that she mixes the [facility] a small amount of water so it will go through the tube better and then flushes the tube afterwards. Daughter gives [id number] additional free water throughout the day. Per chart, pt is up ~8lbs from her UBW; this is likely volume related. Of note, pt  noted to have low iron; pt did receive IV iron. Palliative care following; pt does not want invasive procedures.   Medications reviewed and include: insulin, MVI, protonix  Labs reviewed: Na 134(L), BUN 64(H), creat 1.40(H) BNP 1515.3(H)- 5/18 Iron 19(L), TIBC 447(H), ferritin 8(L), folate 52.0, B12 525(H)- 5/18 Hgb 7.8(L), Hct 24.3(L) cbgs- 201, 189 x 24 hrs AIC 6.9(H)- 3/20  NUTRITION - FOCUSED PHYSICAL EXAM:  Flowsheet Row Most Recent Value  Orbital Region Mild depletion  Upper Arm Region Mild depletion  Thoracic and Lumbar Region Mild depletion  Buccal Region Mild depletion  Temple Region Mild depletion  Clavicle Bone Region Moderate depletion  Clavicle and Acromion Bone Region Moderate depletion  Scapular Bone Region Moderate depletion  Dorsal Hand Moderate depletion  Patellar Region Moderate depletion  Anterior Thigh Region Moderate depletion  Posterior Calf Region Moderate depletion  Edema (RD Assessment) None  Hair Reviewed  Eyes Reviewed  Mouth Reviewed  Skin Reviewed  Nails Reviewed     Diet Order:   Diet Order            Diet NPO time specified Except for: Ice Chips, Sips with Meds  Diet effective now                EDUCATION NEEDS:   Education needs have been addressed  Skin:  Skin Assessment: Reviewed RN Assessment (ecchymosis)  Last BM:  5/19- type 6  Height:   Ht Readings from Last 1 Encounters:  02/16/21 _0  (1.702 m)    Weight:   Wt Readings from Last 1 Encounters:  02/15/21 56.7 kg  Ideal Body Weight:  61.3 kg  BMI:  Body mass index is 19.58 kg/m.  Estimated Nutritional Needs:   Kcal:  1400-1600kcal/day  Protein:  70-80g/day  Fluid:  1.4-1.6L/day  Koleen Distance MS, RD, LDN Please refer to [facility] for RD and/or RD on-call/weekend/after hours pager

## 2021-02-16 NOTE — Progress Notes (Signed)
Assumed care of patient at this time from [place]

## 2021-02-16 NOTE — Progress Notes (Signed)
Vonda Antigua, MD [address], [place], [place], [place], [address] [address], [place], [place], [place], [address] Phone: [phone]  Fax: [phone]   Subjective: Patient sitting up comfortably in bed.  Denies any complaints.  No episodes of GI bleeding.  Denies any abdominal pain.   Objective: Exam: Vital signs in last 24 hours: Vitals:   02/16/21 0757 02/16/21 1143 02/16/21 1407 02/16/21 1409  BP: (!) 130/57 (!) 131/52    Pulse: 94 (!) 103 93 90  Resp: 20 20  18   Temp: 99.7 F (37.6 C) 99.1 F (37.3 C)    TempSrc:  Oral    SpO2: 99% 99% (!) 89% 95%  Weight:       Weight change:   Intake/Output Summary (Last 24 hours) at 02/16/2021 1516 Last data filed at 02/16/2021 0347 Gross per 24 hour  Intake 410 ml  Output 200 ml  Net 210 ml    General: No acute distress, AAO x3 Abd: Soft, NT/ND, No HSM Skin: Warm, no rashes Neck: Supple, Trachea midline   Lab Results: Lab Results  Component Value Date   WBC 10.2 02/16/2021   HGB 7.8 (L) 02/16/2021   HCT 24.3 (L) 02/16/2021   MCV 85.9 02/16/2021   PLT 342 02/16/2021   Micro Results: Recent Results (from the past 240 hour(s))  Resp Panel by RT-PCR (Flu A&B, Covid) Nasopharyngeal Swab     Status: None   Collection Time: 02/15/21 12:48 PM   Specimen: Nasopharyngeal Swab; Nasopharyngeal(NP) swabs in vial transport medium  Result Value Ref Range Status   SARS Coronavirus 2 by RT PCR NEGATIVE NEGATIVE Final    Comment: (NOTE) SARS-CoV-2 target nucleic acids are NOT DETECTED.  The SARS-CoV-2 RNA is generally detectable in upper respiratory specimens during the acute phase of infection. The lowest concentration of SARS-CoV-2 viral copies this assay can detect is 138 copies/mL. A negative result does not preclude SARS-Cov-2 infection and should not be used as the sole basis for treatment or other patient management decisions. A negative result may occur with  improper specimen collection/handling,  submission of specimen other than nasopharyngeal swab, presence of viral mutation(s) within the areas targeted by this assay, and inadequate number of viral copies(<138 copies/mL). A negative result must be combined with clinical observations, patient history, and epidemiological information. The expected result is Negative.  Fact Sheet for Patients:  EntrepreneurPulse.com.au  Fact Sheet for Healthcare Providers:  IncredibleEmployment.be  This test is no t yet approved or cleared by the [place] FDA and  has been authorized for detection and/or diagnosis of SARS-CoV-2 by FDA under an Emergency Use Authorization (EUA). This EUA will remain  in effect (meaning this test can be used) for the duration of the COVID-19 declaration under Section 564(b)(1) of the Act, 21 U.S.C.section 360bbb-3(b)(1), unless the authorization is terminated  or revoked sooner.       Influenza A by PCR NEGATIVE NEGATIVE Final   Influenza B by PCR NEGATIVE NEGATIVE Final    Comment: (NOTE) The Xpert Xpress SARS-CoV-2/FLU/RSV plus assay is intended as an aid in the diagnosis of influenza from Nasopharyngeal swab specimens and should not be used as a sole basis for treatment. Nasal washings and aspirates are unacceptable for Xpert Xpress SARS-CoV-2/FLU/RSV testing.  Fact Sheet for Patients: EntrepreneurPulse.com.au  Fact Sheet for Healthcare Providers: IncredibleEmployment.be  This test is not yet approved or cleared by the [place] FDA and has been authorized for detection and/or diagnosis of SARS-CoV-2 by FDA under an Emergency  Use Authorization (EUA). This EUA will remain in effect (meaning this test can be used) for the duration of the COVID-19 declaration under Section 564(b)(1) of the Act, 21 U.S.C. section 360bbb-3(b)(1), unless the authorization is terminated or revoked.  Performed at [facility], [address] [place], [place] [address]    Studies/Results: [place] Chest 2 View  Result Date: 02/15/2021 CLINICAL DATA:  Short of breath, leg swelling EXAM: CHEST - 2 VIEW COMPARISON:  12/17/2020 FINDINGS: Cardiac enlargement. Mild vascular congestion. Possible mild interstitial edema. Small bilateral pleural effusions. Negative for pneumonia. Atherosclerotic calcification aorta Thoracic disc degeneration and scoliosis.  ACDF lower cervical spine IMPRESSION: Cardiac enlargement with mild fluid overload. Electronically Signed   By: Franchot Gallo M.D.   On: 02/15/2021 11:53   Medications:  Scheduled Meds: . feeding supplement (KATE FARMS STANDARD 1.4)  325 mL Per Tube QID  . free water  100 mL Per Tube Q6H  . insulin aspart  0-5 Units Subcutaneous QHS  . insulin aspart  0-9 Units Subcutaneous TID WC  . multivitamin  15 mL Per Tube Daily  . pantoprazole (PROTONIX) IV  40 mg Intravenous Q12H  . rOPINIRole  2 mg Per Tube BID   Continuous Infusions: PRN Meds:.acetaminophen, albuterol, dextromethorphan-guaiFENesin, ondansetron (ZOFRAN) IV, polyvinyl alcohol   Assessment: Principal Problem:   Symptomatic anemia Active Problems:   Iron deficiency anemia   Esophageal stricture   GERD (gastroesophageal reflux disease)   Type II diabetes mellitus with renal manifestations (HCC)   HTN (hypertension)   Stroke (HCC)   Hyperkalemia   Acute renal failure superimposed on stage 3a chronic kidney disease (HCC)   Chronic combined systolic and diastolic congestive heart failure (HCC)    Plan: Dr. Posey Pronto at bedside today, and patient states that she is 87 years old and does not want to keep going through procedures as she would not want surgery or invasive treatment if we were to find abnormalities like colon cancer  She remembers that they were unable to go past her esophagus on previous EGDs and her colonoscopy was also incomplete and she does not want to go through a prep again.  She understands the  risk of underlying lesions or malignancy but states that she is 87 years old and she is comfortable with that  Her daughter [name] was available to speak over the phone and she concurs with her mother  If any evidence of active GI bleeding occur, please page GI.  If patients have any concerns or questions or changes her mind about procedures, please let us know  Patient should have close follow-up with hematology when she gets discharged for as needed transfusions as well   LOS: 1 day   Vonda Antigua, MD 02/16/2021, 3:16 PM

## 2021-02-16 NOTE — Care Management CC44 (Signed)
Condition Code 44 Documentation Completed  Patient Details  Name: [name] Day MRN: [id number] Date of Birth: 06-14-1929   Condition Code 44 given:  Yes Patient signature on Condition Code 44 notice:  Yes Documentation of 2 MD's agreement:  Yes Code 44 added to claim:  Yes    Alberteen Sam, LCSW 02/16/2021, 3:48 PM

## 2021-02-16 NOTE — Plan of Care (Signed)

## 2021-02-16 NOTE — Consult Note (Signed)
CARDIOLOGY CONSULT NOTE               Patient ID: [name] MRN: [id number] DOB/AGE: 10/20/1928 87 y.o.  Admit date: 02/15/2021 Referring Physician: Fritzi Mandes, MD  Primary Physician: [name], MD Primary Cardiologist: Bartholome Bill, MD  Reason for Consultation:  CHF exacerbation  HPI: [name] is an 87 y/o female with PMH significant for HFrEF (EF=), moderate aortic valve stenosis,   h.o CVA, Pulmonary HTN, HTN, HLD, DM Type II, CKD stage IIIa, GERD, esophageal stricture s/p PEG tube placement, occlusive mesenteric ischemia and iron deficiency anemia who was admitted due to symptomatic anemia and CHF exacerbation.  ED course: Patient presented via EMS from home with c/o dyspnea and weight gain. Patient's hemoglobin was 6.3 with a hematocrit of 20.6, BNP was 1515.3, Potassium was elevated at 6.1, BUN/creatinine elevated at 72/1.50, CXR was remarkable for cardiomegaly and bilateral pulmonary edema, troponin elevated at 227 and ECG revealed NSR. Additionally, the patient was found to be hypotensive. The patient was placed on supplemental oxygen and admitted to the cardiac unit for further management.   While on the cardiac unit, the patient received 1 unit of blood along with IV lasix. Cardiology was consulted for CHF exacerbation.   The patient states that she is feeling "okay" at this time and she denies having any dyspnea while on the supplemental oxygen therapy. She denies having any chest pain, peripheral edema, or palpitations at this time; however, she does states that she feels "uneasy" when ambulating to the bedside commode. She is resting comfortably in bed with rails up, on the monitor and her daughter is at the bedside.    Review of systems complete and found to be negative unless listed above   Past Medical History:  Diagnosis Date  . Anemia    IRON INFUSIONS  . Aortic valve sclerosis   . Arrhythmia   . Arthritis    osteoarthritis  . Basal cell  carcinoma of skin   . Brain tumor ([place])   . Brain tumor ([place])   . Cervical spine disease   . CHF (congestive heart failure) ([place])   . Diabetes mellitus without complication ([place])   . Dysrhythmia    sinus arrhythmia  . Esophageal stricture    severe, led to feeding tube placement in Sept 2019  . Esophageal ulcer without bleeding   . Gastrostomy tube dependent ([place])    DOES NOT EAT OR DRINK   . GERD (gastroesophageal reflux disease)   . History of kidney stones   . Hyperlipemia   . Hypertension   . Kidney stones   . Leaky heart valve   . Meningioma ([place])   . Occlusive mesenteric ischemia ([place])   . Pulmonary hypertension ([place])   . RLS (restless legs syndrome)   . Vertigo     Past Surgical History:  Procedure Laterality Date  . ABDOMINAL HYSTERECTOMY    . APPENDECTOMY    . ARTHROGRAM KNEE Left   . BACK SURGERY     CERVIVAL NECK FUSION  . CATARACT EXTRACTION W/PHACO Left 11/11/2018   Procedure: CATARACT EXTRACTION PHACO AND INTRAOCULAR LENS PLACEMENT (IOC) LEFT, DIABETIC;  Surgeon: Birder Robson, MD;  Location: [facility] ORS;  Service: Ophthalmology;  Laterality: Left;  [place]  00:41 CDE 6.41 Fluid pack lot # [id number]  . COLONOSCOPY WITH PROPOFOL N/A 06/20/2017   Procedure: COLONOSCOPY WITH PROPOFOL;  Surgeon: Lollie Sails, MD;  Location: [facility] ENDOSCOPY;  Service: Endoscopy;  Laterality: N/A;  .  ESOPHAGOGASTRODUODENOSCOPY (EGD) WITH PROPOFOL N/A 03/14/2015   Procedure: ESOPHAGOGASTRODUODENOSCOPY (EGD) WITH PROPOFOL;  Surgeon: Josefine Class, MD;  Location: [facility] ENDOSCOPY;  Service: Endoscopy;  Laterality: N/A;  . ESOPHAGOGASTRODUODENOSCOPY (EGD) WITH PROPOFOL N/A 03/28/2017   Procedure: ESOPHAGOGASTRODUODENOSCOPY (EGD) WITH PROPOFOL;  Surgeon: Lollie Sails, MD;  Location: [facility] ENDOSCOPY;  Service: Endoscopy;  Laterality: N/A;  . ESOPHAGOGASTRODUODENOSCOPY (EGD) WITH PROPOFOL N/A 06/20/2017   Procedure: ESOPHAGOGASTRODUODENOSCOPY (EGD) WITH PROPOFOL;  Surgeon: Lollie Sails, MD;  Location: [facility] ENDOSCOPY;  Service: Endoscopy;  Laterality: N/A;  . ESOPHAGOGASTRODUODENOSCOPY (EGD) WITH PROPOFOL N/A 10/21/2017   Procedure: ESOPHAGOGASTRODUODENOSCOPY (EGD) WITH PROPOFOL;  Surgeon: Lollie Sails, MD;  Location: [facility] ENDOSCOPY;  Service: Endoscopy;  Laterality: N/A;  . ESOPHAGOGASTRODUODENOSCOPY (EGD) WITH PROPOFOL N/A 04/15/2018   Procedure: ESOPHAGOGASTRODUODENOSCOPY (EGD) WITH PROPOFOL;  Surgeon: Lollie Sails, MD;  Location: [facility] ENDOSCOPY;  Service: Endoscopy;  Laterality: N/A;  . ESOPHAGUS SURGERY     CLOSURE  . EYE SURGERY    . HYSTERECTOMY ABDOMINAL WITH SALPINGECTOMY    . IR GASTROSTOMY TUBE MOD SED  06/11/2018  . IR GASTROSTOMY TUBE REMOVAL  03/25/2019  . IR REPLACE G-TUBE SIMPLE WO FLUORO  10/19/2020  . PERIPHERAL VASCULAR CATHETERIZATION N/A 01/23/2016   Procedure: Visceral Venography;  Surgeon: Algernon Huxley, MD;  Location: [place] CV LAB;  Service: Cardiovascular;  Laterality: N/A;  . PERIPHERAL VASCULAR CATHETERIZATION  01/23/2016   Procedure: Peripheral Vascular Intervention;  Surgeon: Algernon Huxley, MD;  Location: [place] CV LAB;  Service: Cardiovascular;;  . UPPER ESOPHAGEAL ENDOSCOPIC ULTRASOUND (EUS) N/A 12/05/2017   Procedure: UPPER ESOPHAGEAL ENDOSCOPIC ULTRASOUND (EUS);  Surgeon: Jola Schmidt, MD;  Location: [facility] ENDOSCOPY;  Service: Endoscopy;  Laterality: N/A;  . VISCERAL ANGIOGRAPHY N/A 03/18/2017   Procedure: Visceral Angiography;  Surgeon: Algernon Huxley, MD;  Location: [place] CV LAB;  Service: Cardiovascular;  Laterality: N/A;  . VISCERAL ARTERY INTERVENTION N/A 03/18/2017   Procedure: Visceral Artery Intervention;  Surgeon: Algernon Huxley, MD;  Location: [place] CV LAB;  Service: Cardiovascular;  Laterality: N/A;    Medications Prior to Admission  Medication Sig Dispense Refill Last Dose  . acetaminophen (TYLENOL) 160 MG/5ML solution Place 20.3 mLs (650 mg total) into feeding tube every 6 (six) hours as needed  for mild pain. 120 mL 0 02/14/2021 at Unknown time  . aspirin 81 MG chewable tablet Place 1 tablet (81 mg total) into feeding tube daily. 30 tablet 0 02/15/2021 at Unknown time  . carvedilol (COREG) 3.125 MG tablet Place 1 tablet (3.125 mg total) into feeding tube 2 (two) times daily with a meal. 30 tablet 3 02/15/2021 at Unknown time  . clopidogrel (PLAVIX) 75 MG tablet GIVE 1 TABLET VIA FEEDING TUBE EVERY [name] AS DIRECTED 30 tablet 2 02/15/2021 at Unknown time  . furosemide (LASIX) 20 MG tablet Place 0.5 tablets (10 mg total) into feeding tube daily. 30 tablet 0 02/15/2021 at 0630  . glipiZIDE (GLUCOTROL XL) 2.5 MG 24 hr tablet Take 2.5 mg by mouth at bedtime.   02/14/2021 at Unknown time  . glipiZIDE (GLUCOTROL) 5 MG tablet Take 5 mg by mouth in the morning.   02/15/2021 at Unknown time  . lisinopril (ZESTRIL) 2.5 MG tablet Take 1 tablet (2.5 mg total) by mouth daily. 30 tablet 0 02/15/2021 at Unknown time  . metFORMIN (GLUCOPHAGE) 500 MG tablet Place 1 tablet (500 mg total) into feeding tube daily with breakfast. 30 tablet 0 02/15/2021 at Unknown time  . Multiple Vitamin (MULTIVITAMIN)  LIQD Place 15 mLs into feeding tube daily. 450 mL 1 02/15/2021 at Unknown time  . pantoprazole sodium (PROTONIX) 40 mg/20 mL PACK Place 20 mLs (40 mg total) into feeding tube daily. 600 mL 0 02/15/2021 at Unknown time  . polyethylene glycol (MIRALAX / GLYCOLAX) 17 g packet Place 17 g into feeding tube daily as needed. 14 each 0 Past Week at Unknown time  . rOPINIRole (REQUIP) 2 MG tablet Place 1 tablet (2 mg total) into feeding tube 2 (two) times daily. 60 tablet 0 02/15/2021 at Unknown time  . spironolactone (ALDACTONE) 25 MG tablet Take 25 mg by mouth in the morning.   02/15/2021 at Unknown time  . hydrocerin (EUCERIN) CREA Apply 1 application topically 2 (two) times daily. To bilateral feet  0   . Hypromellose 0.2 % SOLN Place 1 drop into both eyes 3 (three) times daily as needed (dry eyes).      . Nutritional Supplements  (FEEDING SUPPLEMENT, KATE FARMS STANDARD 1.4,) LIQD liquid Place 325 mLs into feeding tube 4 (four) times daily.     . polyvinyl alcohol (LIQUIFILM TEARS) 1.4 % ophthalmic solution Place 1 drop into both eyes as needed for dry eyes. 15 mL 0   . Water For Irrigation, Sterile (FREE WATER) SOLN Place 100 mLs into feeding tube every 6 (six) hours.      Social History   Socioeconomic History  . Marital status: Widowed    Spouse name: Not on file  . Number of children: Not on file  . Years of education: Not on file  . Highest education level: Not on file  Occupational History  . Not on file  Tobacco Use  . Smoking status: Never Smoker  . Smokeless tobacco: Never Used  Vaping Use  . Vaping Use: Never used  Substance and Sexual Activity  . Alcohol use: Never  . Drug use: Never  . Sexual activity: Not Currently  Other Topics Concern  . Not on file  Social History Narrative   Lives alone.    Daughter helps her.    Social Determinants of Health   Financial Resource Strain: Not on file  Food Insecurity: Not on file  Transportation Needs: Not on file  Physical Activity: Not on file  Stress: Not on file  Social Connections: Not on file  Intimate Partner Violence: Not on file    Family History  Problem Relation Age of Onset  . Stroke Mother   . Hypertension Mother   . Heart disease Mother   . Cancer Father   . Heart disease Father   . Heart attack Sister   . Diabetes Sister   . Heart attack Brother       Review of systems complete and found to be negative unless listed above      PHYSICAL EXAM  General: Well developed, well nourished, in no acute distress HEENT:  Normocephalic and atraumatic. PERRL Neck:  No JVD.  Lungs: wheezing bilaterally to auscultation. Chest expansion symmetrical, normal effort of breathing, on supplemental oxygen  Heart: HRRR . Normal S1 and S2. Murmur ausculated. No gallops.  Abdomen: Bowel sounds are positive, abdomen soft and non-tender. Peg  tube remains in place.   Msk:   Normal strength and tone for age. Extremities: No clubbing, cyanosis or edema.   Neuro: Alert and oriented X 3. Psych:  Good affect, responds appropriately  Labs:   Lab Results  Component Value Date   WBC 10.2 02/16/2021   HGB 7.8 (L) 02/16/2021  HCT 24.3 (L) 02/16/2021   MCV 85.9 02/16/2021   PLT 342 02/16/2021    Recent Labs  Lab 02/16/21 0009  NA 134*  K 4.7  CL 102  CO2 22  BUN 64*  CREATININE 1.40*  CALCIUM 8.2*  GLUCOSE 115*   Lab Results  Component Value Date   TROPONINI <0.03 06/23/2018    Lab Results  Component Value Date   CHOL 178 12/18/2020   CHOL 178 11/23/2019   Lab Results  Component Value Date   HDL 56 12/18/2020   HDL 67 11/23/2019   Lab Results  Component Value Date   LDLCALC 112 (H) 12/18/2020   LDLCALC 101 (H) 11/23/2019   Lab Results  Component Value Date   TRIG 51 12/18/2020   TRIG 50 11/23/2019   Lab Results  Component Value Date   CHOLHDL 3.2 12/18/2020   CHOLHDL 2.7 11/23/2019   No results found for: LDLDIRECT    Radiology: DG Chest 2 View  Result Date: 02/15/2021 CLINICAL DATA:  Short of breath, leg swelling EXAM: CHEST - 2 VIEW COMPARISON:  12/17/2020 FINDINGS: Cardiac enlargement. Mild vascular congestion. Possible mild interstitial edema. Small bilateral pleural effusions. Negative for pneumonia. Atherosclerotic calcification aorta Thoracic disc degeneration and scoliosis.  ACDF lower cervical spine IMPRESSION: Cardiac enlargement with mild fluid overload. Electronically Signed   By: Franchot Gallo M.D.   On: 02/15/2021 11:53    EKG: NSR with HR of 82 bpm  ASSESSMENT AND PLAN:  [name] is an 87 y/o female with PMH significant for HFrEF (EF=), moderate aortic valve stenosis, h.o CVA, Pulmonary HTN, HTN, HLD, DM Type II, CKD stage IIIa, GERD, esophageal stricture s/p PEG tube placement, occlusive mesenteric ischemia and iron deficiency anemia who was admitted due to symptomatic anemia  and CHF exacerbation. Upon ED arrival the patient was found to be hypotensive and experiencing dyspnea. Blood work revealed a hemoglobin was 6.3 with a hematocrit of 20.6, BNP was 1515.3, troponin elevated at 227, potassium was elevated at 6.1 and BUN/creatinine elevated at 72/1.50. CXR was remarkable for cardiomegaly and bilateral pulmonary edema and her ECG revealed NSR. The patient was diagnosed with symptomatic anemia, given 1 unit of PRBC along with a dose of IV lasix. The patient appears chronically ill and hemodynamically stable at this time.Her most recent echocardiogram from 12/18/2020 revealed moderately decreased LV systolic function with estimated EF of 35-40%, global hypokinesis, mild left ventricular hypertrophy, grade 2 diastolic LV dysfunction, moderately dilated left atrial and right atrial, moderate to severe mitral valve regurgitation and moderate aortic valve stenosis.  Her blood pressures continue to be soft at this time; thus, we are limited in how we can treat the CHF exacerbation at this time due to the increased risk of worsening hypotension from the CHF medications. Once patient's blood pressure improves, we will look to resume her at home ACEi, diuretics and beta blocker at the lowest dosages.   1. Chronic combined systolic and diastolic CHF (EF = 123456), uncompensated, patient symptomatic with dyspnea and volume overload, BNP is 1515.3, hemodynamically stable  -Medication management limited at this time in the presence of hypotension.   -Recommend holding lisinopril, Coreg and spironolactone at this time until the patient's blood pressure is better able to tolerate these medications.  -Patient given 20 mg of IV Lasix along with blood transfusion.  -Perform daily weights and monitor strict I's and O's.  -Do not restrict sodium significantly at this time in the presence of hyponatremia.   2. Elevated  troponin, likely due to demand ischemia, ACS is unlikely an etiology at this  time  -Recommend trending troponins.  -We will resume CHF management as stated above.   3. Moderate Aortic Valve stenosis,, reasonably stable, recent echocardiogram reveals moderate aortic stenosis and significant mitral regurgitation  -Recommend continuing medical management at this time as the patient is a poor candidate for TAVR or SAVR.     4. Symptomatic Anemia s/p blood transfusion with 1 unit of PRBC along with IV lasix, patient is followed by hematology as an outpatient  -Agree with current management.  -Patient is noted to be receiving IV iron later today as well.  5.  Hypertension with current hypotension, patient is stable at this time  -Recommend holding lisinopril, carvedilol and spironolactone at this time.  -Recommend monitoring vital signs every 4 hours per protocol.  -Recommend following heart healthy diet.  6.  History of stroke, fairly stable, currently on DAPT  -Agree with holding aspirin and Plavix at this time in the presence of severe anemia.  7.  Hyperkalemia, fairly stable, potassium of 6.3, patient is asymptomatic, ecg is NSR  -Recommend repeat CMP due to recent diuresis with IV lasix which should help to decrease potassium levels. If potassium continues to be elevated, consider Kayexalate therapy.  8.  Acute renal failure superimposed on CKD stage III  -Agree with current management.  -Recommend trending CMP while inpatient.   9. GERD  -Agree with continued Protonix therapy.   10. Esophageal stricture s/p PEG tube placement  -Agree with continued intermittent feeding supplement and water boluses.  11.  Diabetes mellitus type II, fairly controlled  -Agree with sliding scale insulin per protocol.  12.  Dyspnea with wheezing, stable, patient is currently on supplemental oxygen via nasal cannula at 2 L and has an O2 saturation >90%, stable  -Recommend RT evaluation and treatment.   Signed: Damascus Feldpausch ACNPC-AG 02/16/2021, 4:06 PM

## 2021-02-16 NOTE — Care Management Obs Status (Signed)
[place] NOTIFICATION   Patient Details  Name: PREETHI SCANTLEBURY Day MRN: [id number] Date of Birth: [birth year]   Medicare Observation Status Notification Given:  Yes    Alberteen Sam, LCSW 02/16/2021, 3:48 PM

## 2021-02-16 NOTE — Progress Notes (Addendum)
[place] [facility] [place] NAME: [name]    MR#:  [id number]  DATE OF BIRTH:  08/31/1929  SUBJECTIVE:  patient came in with symptoms of weakness and shortness of breath. Currently not having any respiratory distress. She was found to have hemoglobin of 6.3 status post one unit blood transfusion. Received IV iron infusion today. Patient denies any blood he stools or hematemesis. No family at bedside. Patient denies any chest pain. She lives at home with her daughters  REVIEW OF SYSTEMS:   Review of Systems  Constitutional: Negative for chills, fever and weight loss.  HENT: Negative for ear discharge, ear pain and nosebleeds.   Eyes: Negative for blurred vision, pain and discharge.  Respiratory: Positive for shortness of breath. Negative for sputum production, wheezing and stridor.   Cardiovascular: Negative for chest pain, palpitations, orthopnea and PND.  Gastrointestinal: Negative for abdominal pain, diarrhea, nausea and vomiting.  Genitourinary: Negative for frequency and urgency.  Musculoskeletal: Negative for back pain and joint pain.  Neurological: Positive for weakness. Negative for sensory change, speech change and focal weakness.  Psychiatric/Behavioral: Negative for depression and hallucinations. The patient is not nervous/anxious.    Tolerating Diet:thru PEG feeds Tolerating PT: pending  DRUG ALLERGIES:   Allergies  Allergen Reactions  . Elemental Sulfur Diarrhea and Nausea And Vomiting  . Gabapentin Swelling  . Lipitor [Atorvastatin] Other (See Comments)    Muscle aches  . Mevacor [Lovastatin] Other (See Comments)    Muscle aches  . Milk-Related Compounds Other (See Comments)    Large quantities cause headaches   . Septra [Sulfamethoxazole-Trimethoprim] Other (See Comments)    Unknown  . Statins Other (See Comments)    Muscle pain  . Zocor [Simvastatin] Other (See Comments)    Muscle aches    VITALS:  Blood  pressure (!) 131/52, pulse 90, temperature 99.1 F (37.3 C), temperature source Oral, resp. rate 18, weight 56.7 kg, SpO2 95 %.  PHYSICAL EXAMINATION:   Physical Exam  GENERAL:  87 y.o.-year-old patient lying in the bed with no acute distress.  HEENT: Head atraumatic, normocephalic. Oropharynx and nasopharynx clear. Pallor+ LUNGS: Normal breath sounds bilaterally, no wheezing, rales, rhonchi. No use of accessory muscles of respiration.  CARDIOVASCULAR: S1, S2 normal. No murmurs, rubs, or gallops.  ABDOMEN: Soft, nontender, nondistended. Bowel sounds present. No organomegaly or mass. PEG+ EXTREMITIES: No cyanosis, clubbing or edema b/l.    NEUROLOGIC: Cranial nerves II through XII are intact. gen weakness. Mild slurring of speech PSYCHIATRIC:  patient is alert and oriented x 3.  SKIN: No obvious rash, lesion, or ulcer.   LABORATORY PANEL:  CBC Recent Labs  Lab 02/16/21 0636  WBC 10.2  HGB 7.8*  HCT 24.3*  PLT 342    Chemistries  Recent Labs  Lab 02/16/21 0009  NA 134*  K 4.7  CL 102  CO2 22  GLUCOSE 115*  BUN 64*  CREATININE 1.40*  CALCIUM 8.2*   Cardiac Enzymes No results for input(s): TROPONINI in the last 168 hours. RADIOLOGY:  DG Chest 2 View  Result Date: 02/15/2021 CLINICAL DATA:  Short of breath, leg swelling EXAM: CHEST - 2 VIEW COMPARISON:  12/17/2020 FINDINGS: Cardiac enlargement. Mild vascular congestion. Possible mild interstitial edema. Small bilateral pleural effusions. Negative for pneumonia. Atherosclerotic calcification aorta Thoracic disc degeneration and scoliosis.  ACDF lower cervical spine IMPRESSION: Cardiac enlargement with mild fluid overload. Electronically Signed   By: Franchot Gallo M.D.   On: 02/15/2021  11:53   ASSESSMENT AND PLAN:  [name] is a 87 y.o. female with medical history significant of HTN, HLD, DM, stroke, GERD, esophageal stricture with feeding tube placement, iron deficiency anemia, pulm HTN, occlusive mesenteric  ischemia, CHF with EF 35-40%, aortic stenosis, CKD-IIIa, who presents with shortness breath.  Symptomatic anemia and hx of iron deficiency anemia: Per her daughter, patient is getting IV iron infusion intermittently.  Her hemoglobin dropped from 9.3 to 6.3.  Maybe due to occult GI blood loss.  Dr. Bonna Gains of GI is consulted. -Hold aspirin, Plavix - came in with 6.3--s/p  1 unit BT--8.3--7.8 -  IV pantoprazole 40 mg bid - Zofran IV for nausea - Avoid NSAIDs and SQ heparin - IV venofer 200 mg x1 -- patient sees Dr. Mike Gip hematology. She has not been seen since her visit in January. She will need to follow-up for routine IV iron infusions -- G.I. consultation with Dr. Bonna Gains appreciated. No evidence of active bleed. No plans for G.I. evaluation at present. She will be discussing with patient's daughter also.  Chronic combined systolic and diastolic congestive heart failure: 2D echo on 12/18/2020 showed EF 35-40% with grade 2 diastolic dysfunction.  Patient has trace leg edema.  Chest x-ray showed mild fluid overload.  Patient's blood pressures are soft, cannot treat with aggressive diuresis. -Received IV 20 mg of Lasix during blood transfusion x1 -Hold spironolactone due to softer blood pressure -- wean to room air. No respiratory distress.   chronic esophageal stricture -Continue feeding tube  GERD (gastroesophageal reflux disease) - on IV Protonix  Type II diabetes mellitus with renal manifestations [facility]): Recent A1c 6.9, well controlled.  Patient taking glipizide and metformin -SSI  HTN (hypertension): Blood pressure soft -Hold Lasix, lisinopril, spironolactone, Coreg -Continue Coreg  Receny stroke (HCC) -Hold aspirin, Plavix-- will resume if okay with G.I.  Hyperkalemia: Potassium 6.1-- 4.7 -Treated with D50, calcium gluconate, 5 units of NovoLog -Leukoma 10 g x1 -- resolved  Acute renal failure superimposed on stage 3a chronic kidney disease ([place]): Baseline  creatinine 0.80-1.0.  Her creatinine is 1.50, BUN 72, likely due to dehydration and continuation of diuretics and lisinopril. -Hold Lasix, lisinopril, spironolactone --creat 1.5--> 1.4    palliative care to see patient.  DVT ppx: SCD Code Status: Full code per pt and her daughtger Family Communication: left message for dter [name] Disposition Plan:  Anticipate discharge back to previous environment Consults called: Dr. Bonna Gains of GI Admission status and Level of care: Progressive Cardiac   Status is: Inpatient  Remains inpatient appropriate because:Inpatient level of care appropriate due to severity of illness   Dispo: The patient is from: Home  Anticipated d/c is to: Home  Patient currently is not medically stable to d/c.              Difficult to place patient No          TOTAL TIME TAKING CARE OF THIS PATIENT: *25* minutes.  >50% time spent on counselling and coordination of care  Note: This dictation was prepared with Dragon dictation along with smaller phrase technology. Any transcriptional errors that result from this process are unintentional.  Fritzi Mandes M.D    [facility]   CC: Primary care physician; [name], MDPatient ID: Genella Mech [name], female   DOB: 04/16/1929, 87 y.o.   MRN: [id number]

## 2021-02-16 NOTE — Consult Note (Addendum)
Consultation Note Date: 02/16/2021   Patient Name: [name] Day  DOB: 14-Jan-1929  MRN: [id number]  Age / Sex: 87 y.o., female  PCP: [name], MD Referring Physician: Fritzi Mandes, MD  Reason for Consultation: Establishing goals of care  HPI/Patient Profile: [name] Day is a 87 y.o. female with medical history significant of HTN, HLD, DM, stroke, GERD, esophageal stricture with feeding tube placement, iron deficiency anemia, pulm HTN, occlusive mesenteric ischemia, CHF with EF 35-40%, aortic stenosis, CKD-IIIa, who presents with shortness breath.  Clinical Assessment and Goals of Care: Patient is resting in bed with one of her daughters at bedside. She lives at home alone. Her children and grandchild take turns staying with her during the day since her CVA. She was fully independent prior to her CVA. Following her CVA she has right hand weakness which makes it hard to do things. Her PEG has been present since 2019 due to esophageal stricture.   We discussed her diagnoses in detail, prognosis, and GOC.  A detailed discussion was had today regarding advanced directives.  Concepts specific to code status, artifical feeding and hydration, IV antibiotics and rehospitalization were discussed.  The difference between an aggressive medical intervention path and a comfort care path was discussed.  Values and goals of care important to patient and family were attempted to be elicited.  Discussed limitations of medical interventions to prolong quality of life in some situations and discussed the concept of human mortality. She states she does not want to suffer. Independence is important to her.     Patient states she has been advised to have an NPO status given her esophageal stricture with PEG. She states there are things she enjoys grinding up and eating such as chicken and ranch dressing. She states for  the most part she does not have difficulty swallowing but if she does, she is able to regurgitate it back up. Discussed possible changes in swallowing over time and possible changes since the CVA.  She states regardless of findings, she will still want to eat and drink the few things she desires to; this is part of her QOL.  She states she would not ever want a breathing tube, or to have a machine breathing for her. She states she would not want chest compressions, shocks, or a breathing tube and would want to die naturally and peacefully.   Daughter [name] called her sister [name] who is HPOA. Details explained and conversation discussed. She is in agreement with following patient's wishes and states her husband just died a year ago. She discusses decisions that were made at that time.    I completed a MOST form today and the signed original was placed in the chart. The form was signed by [name] as patient is not able to sign herself due to CVA affecting right hand. [name] was on the phone at the time.  A photocopy was placed in the chart to be scanned into EMR. The patient outlined their wishes for the following treatment  decisions:  Cardiopulmonary Resuscitation: Do Not Attempt Resuscitation (DNR/No CPR)  Medical Interventions: Limited Additional Interventions: Use medical treatment, IV fluids and cardiac monitoring as indicated, DO NOT USE intubation or mechanical ventilation. May consider use of less invasive airway support such as BiPAP or CPAP. Also provide comfort measures. Transfer to the hospital if indicated. Avoid intensive care.   Antibiotics: Antibiotics if indicated  IV Fluids: IV fluids if indicated  Feeding Tube: Feeding tube long-term if indicated      SUMMARY OF RECOMMENDATIONS   DNR/DNI.  Treat the treatable.    Prognosis:   Poor prognosis overall.      Primary Diagnoses: Present on Admission: . Symptomatic anemia . Type II diabetes mellitus with renal  manifestations ([place]) . HTN (hypertension) . Stroke ([place]) . Hyperkalemia . Acute renal failure superimposed on stage 3a chronic kidney disease ([place]) . GERD (gastroesophageal reflux disease) . Chronic combined systolic and diastolic congestive heart failure ([place]) . Iron deficiency anemia   I have reviewed the medical record, interviewed the patient and family, and examined the patient. The following aspects are pertinent.  Past Medical History:  Diagnosis Date  . Anemia    IRON INFUSIONS  . Aortic valve sclerosis   . Arrhythmia   . Arthritis    osteoarthritis  . Basal cell carcinoma of skin   . Brain tumor ([place])   . Brain tumor ([place])   . Cervical spine disease   . CHF (congestive heart failure) ([place])   . Diabetes mellitus without complication ([place])   . Dysrhythmia    sinus arrhythmia  . Esophageal stricture    severe, led to feeding tube placement in Sept 2019  . Esophageal ulcer without bleeding   . Gastrostomy tube dependent ([place])    DOES NOT EAT OR DRINK   . GERD (gastroesophageal reflux disease)   . History of kidney stones   . Hyperlipemia   . Hypertension   . Kidney stones   . Leaky heart valve   . Meningioma ([place])   . Occlusive mesenteric ischemia ([place])   . Pulmonary hypertension ([place])   . RLS (restless legs syndrome)   . Vertigo    Social History   Socioeconomic History  . Marital status: Widowed    Spouse name: Not on file  . Number of children: Not on file  . Years of education: Not on file  . Highest education level: Not on file  Occupational History  . Not on file  Tobacco Use  . Smoking status: Never Smoker  . Smokeless tobacco: Never Used  Vaping Use  . Vaping Use: Never used  Substance and Sexual Activity  . Alcohol use: Never  . Drug use: Never  . Sexual activity: Not Currently  Other Topics Concern  . Not on file  Social History Narrative   Lives alone.    Daughter helps her.    Social Determinants of Health   Financial Resource  Strain: Not on file  Food Insecurity: Not on file  Transportation Needs: Not on file  Physical Activity: Not on file  Stress: Not on file  Social Connections: Not on file   Family History  Problem Relation Age of Onset  . Stroke Mother   . Hypertension Mother   . Heart disease Mother   . Cancer Father   . Heart disease Father   . Heart attack Sister   . Diabetes Sister   . Heart attack Brother    Scheduled Meds: . feeding supplement (KATE FARMS STANDARD  1.4)  325 mL Per Tube QID  . free water  100 mL Per Tube Q6H  . insulin aspart  0-5 Units Subcutaneous QHS  . insulin aspart  0-9 Units Subcutaneous TID WC  . multivitamin  15 mL Per Tube Daily  . pantoprazole (PROTONIX) IV  40 mg Intravenous Q12H  . rOPINIRole  2 mg Per Tube BID   Continuous Infusions: PRN Meds:.acetaminophen, albuterol, dextromethorphan-guaiFENesin, ondansetron (ZOFRAN) IV, polyvinyl alcohol Medications Prior to Admission:  Prior to Admission medications   Medication Sig Start Date End Date Taking? Authorizing Provider  acetaminophen (TYLENOL) 160 MG/5ML solution Place 20.3 mLs (650 mg total) into feeding tube every 6 (six) hours as needed for mild pain. 06/13/18  Yes Demetrios Loll, MD  aspirin 81 MG chewable tablet Place 1 tablet (81 mg total) into feeding tube daily. 12/22/20  Yes Regalado, Belkys A, MD  carvedilol (COREG) 3.125 MG tablet Place 1 tablet (3.125 mg total) into feeding tube 2 (two) times daily with a meal. 12/22/20  Yes Regalado, Belkys A, MD  clopidogrel (PLAVIX) 75 MG tablet GIVE 1 TABLET VIA FEEDING TUBE EVERY DAY AS DIRECTED 12/30/20  Yes Love, Ivan Anchors, PA-C  furosemide (LASIX) 20 MG tablet Place 0.5 tablets (10 mg total) into feeding tube daily. 12/30/20  Yes Love, Ivan Anchors, PA-C  glipiZIDE (GLUCOTROL XL) 2.5 MG 24 hr tablet Take 2.5 mg by mouth at bedtime. 01/20/21 07/19/21 Yes [provider]  glipiZIDE (GLUCOTROL) 5 MG tablet Take 5 mg by mouth in the morning. 01/20/21 07/19/21 Yes  [provider]  lisinopril (ZESTRIL) 2.5 MG tablet Take 1 tablet (2.5 mg total) by mouth daily. 12/31/20  Yes Love, Ivan Anchors, PA-C  metFORMIN (GLUCOPHAGE) 500 MG tablet Place 1 tablet (500 mg total) into feeding tube daily with breakfast. 12/31/20  Yes Love, Ivan Anchors, PA-C  Multiple Vitamin (MULTIVITAMIN) LIQD Place 15 mLs into feeding tube daily. 06/13/18  Yes Demetrios Loll, MD  pantoprazole sodium (PROTONIX) 40 mg/20 mL PACK Place 20 mLs (40 mg total) into feeding tube daily. 12/30/20  Yes Love, Ivan Anchors, PA-C  polyethylene glycol (MIRALAX / GLYCOLAX) 17 g packet Place 17 g into feeding tube daily as needed. 12/30/20  Yes Love, Ivan Anchors, PA-C  rOPINIRole (REQUIP) 2 MG tablet Place 1 tablet (2 mg total) into feeding tube 2 (two) times daily. 12/30/20  Yes Love, Ivan Anchors, PA-C  spironolactone (ALDACTONE) 25 MG tablet Take 25 mg by mouth in the morning. 02/01/21  Yes [provider]  hydrocerin (EUCERIN) CREA Apply 1 application topically 2 (two) times daily. To bilateral feet 12/27/20   Love, Ivan Anchors, PA-C  Hypromellose 0.2 % SOLN Place 1 drop into both eyes 3 (three) times daily as needed (dry eyes).     [provider]  Nutritional Supplements (FEEDING SUPPLEMENT, KATE FARMS STANDARD 1.4,) LIQD liquid Place 325 mLs into feeding tube 4 (four) times daily. 12/30/20   Love, Ivan Anchors, PA-C  polyvinyl alcohol (LIQUIFILM TEARS) 1.4 % ophthalmic solution Place 1 drop into both eyes as needed for dry eyes. 12/30/20   Love, Ivan Anchors, PA-C  Water For Irrigation, Sterile (FREE WATER) SOLN Place 100 mLs into feeding tube every 6 (six) hours. 12/30/20   Bary Leriche, PA-C   Allergies  Allergen Reactions  . Elemental Sulfur Diarrhea and Nausea And Vomiting  . Gabapentin Swelling  . Lipitor [Atorvastatin] Other (See Comments)    Muscle aches  . Mevacor [Lovastatin] Other (See Comments)    Muscle aches  .  Milk-Related Compounds Other (See Comments)    Large quantities cause headaches   . Septra  [Sulfamethoxazole-Trimethoprim] Other (See Comments)    Unknown  . Statins Other (See Comments)    Muscle pain  . Zocor [Simvastatin] Other (See Comments)    Muscle aches   Review of Systems  Respiratory: Positive for shortness of breath.     Physical Exam Pulmonary:     Comments: O2 sat 88% on RA.  Skin:    General: Skin is warm and dry.  Neurological:     Mental Status: She is alert.     Vital Signs: BP (!) 131/52 (BP Location: Left Arm)   Pulse 90   Temp 99.1 F (37.3 C) (Oral)   Resp 18   Wt 56.7 kg   SpO2 95%   BMI 19.58 kg/m  Pain Scale: 0-10   Pain Score: 0-No pain   SpO2: SpO2: 95 % O2 Device:SpO2: 95 % O2 Flow Rate: .O2 Flow Rate (L/min): 2 L/min  IO: Intake/output summary:   Intake/Output Summary (Last 24 hours) at 02/16/2021 1452 Last data filed at 02/16/2021 0347 Gross per 24 hour  Intake 410 ml  Output 200 ml  Net 210 ml    LBM: Last BM Date: 02/16/21 Baseline Weight: Weight: 56.7 kg Most recent weight: Weight: 56.7 kg       Time In: 1:50 Time Out: 3:00 Time Total:70 min Greater than 50%  of this time was spent counseling and coordinating care related to the above assessment and plan.  Family would like to speak with cardiology. Attending team made aware.   Signed by: Asencion Gowda, NP   Please contact Palliative Medicine Team phone at [phone] for questions and concerns.  For individual provider: See Shea Evans

## 2021-02-17 DIAGNOSIS — D649 Anemia, unspecified: Secondary | ICD-10-CM | POA: Diagnosis not present

## 2021-02-17 LAB — CBC
HCT: 25.3 % — ABNORMAL LOW (ref 36.0–46.0)
Hemoglobin: 7.9 g/dL — ABNORMAL LOW (ref 12.0–15.0)
MCH: 27.4 pg (ref 26.0–34.0)
MCHC: 31.2 g/dL (ref 30.0–36.0)
MCV: 87.8 fL (ref 80.0–100.0)
Platelets: 344 10*3/uL (ref 150–400)
RBC: 2.88 MIL/uL — ABNORMAL LOW (ref 3.87–5.11)
RDW: 14.7 % (ref 11.5–15.5)
WBC: 12.5 10*3/uL — ABNORMAL HIGH (ref 4.0–10.5)
nRBC: 0 % (ref 0.0–0.2)

## 2021-02-17 LAB — GLUCOSE, CAPILLARY: Glucose-Capillary: 178 mg/dL — ABNORMAL HIGH (ref 70–99)

## 2021-02-17 MED ORDER — CLOPIDOGREL BISULFATE 75 MG PO TABS
75.0000 mg | ORAL_TABLET | Freq: Every day | ORAL | Status: DC
  Filled 2021-02-17: qty 1

## 2021-02-17 MED ORDER — PANTOPRAZOLE SODIUM 40 MG PO PACK
40.0000 mg | PACK | Freq: Every day | ORAL | Status: DC
  Administered 2021-02-17: 40 mg
  Filled 2021-02-17: qty 20

## 2021-02-17 MED ORDER — METFORMIN HCL 500 MG PO TABS: 500.0000 mg | ORAL_TABLET | Freq: Two times a day (BID) | ORAL | 1 refills | Status: AC

## 2021-02-17 MED ORDER — POLYETHYLENE GLYCOL 3350 17 G PO PACK: 17.0000 g | PACK | Freq: Every day | ORAL | Status: DC | PRN

## 2021-02-17 MED ORDER — CARVEDILOL 3.125 MG PO TABS
3.1250 mg | ORAL_TABLET | Freq: Two times a day (BID) | ORAL | Status: DC
  Administered 2021-02-17: 3.125 mg
  Filled 2021-02-17: qty 1

## 2021-02-17 MED ORDER — FUROSEMIDE 20 MG PO TABS
10.0000 mg | ORAL_TABLET | Freq: Every day | ORAL | Status: DC
  Administered 2021-02-17: 10 mg
  Filled 2021-02-17: qty 1

## 2021-02-17 MED ORDER — ASPIRIN 81 MG PO CHEW
81.0000 mg | CHEWABLE_TABLET | Freq: Every day | ORAL | Status: DC
  Filled 2021-02-17: qty 1

## 2021-02-17 MED ORDER — SPIRONOLACTONE 25 MG PO TABS
25.0000 mg | ORAL_TABLET | Freq: Every morning | ORAL | Status: DC
  Administered 2021-02-17: 25 mg via ORAL
  Filled 2021-02-17: qty 1

## 2021-02-17 NOTE — Progress Notes (Signed)
Discharge instructions reviewed at bedside with patient and daughter. All questions answered at this time. Vitals WDL. Patient to have home health set up. Everything in place for patient to return home at this time.

## 2021-02-17 NOTE — Progress Notes (Signed)
[facility] Cardiology  Patient Description: [name] is an 87 y/o female with PMH significant for HFrEF (EF=35-40%), moderate aortic valve stenosis,   h.o CVA, Pulmonary HTN, HTN, HLD, DM Type II, CKD stage IIIa, GERD, esophageal stricture s/p PEG tube placement, occlusive mesenteric ischemia and iron deficiency anemia who was admitted due to symptomatic anemia and CHF exacerbation, thus cardiology was consulted.   SUBJECTIVE: The patient reports that she is feeling better on today and she denies any complaints at this time.  The patient denies having any dyspnea at this time but continues to be on 2 L of oxygen via nasal cannula.  The patient denies having any chest pain, peripheral edema, dizziness or palpitations at this time.  The patient's daughter is at the bedside.   OBJECTIVE: The patient appears chronically ill but slightly better on today, she has normal effort of breathing but wheezing upon auscultation of the right lung.  The patient appears euvolemic and does not have any evidence of peripheral edema.  The patient continues to be in normal sinus rhythm per bedside telemetry monitoring her vital signs are stable.  Her blood pressure has significantly improved since yesterday and now appears to be back at her baseline.  The patient is resting comfortably in bed on the monitor with family at the bedside.  Vitals:   02/16/21 2011 02/16/21 2015 02/17/21 0726 02/17/21 0749  BP:  (!) 138/59 (!) 127/54   Pulse:  98 89   Resp:  16 17   Temp:  98.9 F (37.2 C) 98.2 F (36.8 C)   TempSrc:  Oral Oral   SpO2: 94% 99% 100% 98%  Weight:      Height:         Intake/Output Summary (Last 24 hours) at 02/17/2021 1011 Last data filed at 02/16/2021 2015 Gross per 24 hour  Intake 10 ml  Output 500 ml  Net -490 ml      PHYSICAL EXAM  General: Well developed, well nourished, in no acute distress HEENT:  Normocephalic and atraumatic. PERRL Neck:   No JVD.  Lungs: wheezing bilaterally to  auscultation. Chest expansion symmetrical, normal effort of breathing, on supplemental oxygen  Heart: HRRR . Normal S1 and S2. Murmur ausculated. No gallops.  Abdomen: Bowel sounds are positive, abdomen soft and non-tender. Peg tube remains in place.   Msk:   Normal strength and tone for age. Extremities: No clubbing, cyanosis or edema.   Neuro: Alert and oriented X 3. Psych:  Good affect, responds appropriately   LABS: Basic Metabolic Panel: Recent Labs    02/15/21 1053 02/16/21 0009  NA 132* 134*  K 6.1* 4.7  CL 99 102  CO2 22 22  GLUCOSE 256* 115*  BUN 72* 64*  CREATININE 1.50* 1.40*  CALCIUM 8.5* 8.2*   Liver Function Tests: No results for input(s): AST, ALT, ALKPHOS, BILITOT, PROT, ALBUMIN in the last 72 hours. No results for input(s): LIPASE, AMYLASE in the last 72 hours. CBC: Recent Labs    02/16/21 0636 02/17/21 0524  WBC 10.2 12.5*  HGB 7.8* 7.9*  HCT 24.3* 25.3*  MCV 85.9 87.8  PLT 342 344   Cardiac Enzymes: No results for input(s): CKTOTAL, CKMB, CKMBINDEX, TROPONINI in the last 72 hours. BNP: Invalid input(s): POCBNP D-Dimer: No results for input(s): DDIMER in the last 72 hours. Hemoglobin A1C: No results for input(s): HGBA1C in the last 72 hours. Fasting Lipid Panel: No results for input(s): CHOL, HDL, LDLCALC, TRIG, CHOLHDL, LDLDIRECT in the last 72  hours. Thyroid Function Tests: No results for input(s): TSH, T4TOTAL, T3FREE, THYROIDAB in the last 72 hours.  Invalid input(s): FREET3 Anemia Panel: Recent Labs    02/15/21 1053 02/15/21 1221 02/15/21 1333  VITAMINB12  --   --  525  FOLATE  --  52.0  --   FERRITIN  --  8*  --   TIBC  --  447  --   IRON  --  19*  --   RETICCTPCT 4.0*  --   --     DG Chest 2 View  Result Date: 02/15/2021 CLINICAL DATA:  Short of breath, leg swelling EXAM: CHEST - 2 VIEW COMPARISON:  12/17/2020 FINDINGS: Cardiac enlargement. Mild vascular congestion. Possible mild interstitial edema. Small bilateral pleural  effusions. Negative for pneumonia. Atherosclerotic calcification aorta Thoracic disc degeneration and scoliosis.  ACDF lower cervical spine IMPRESSION: Cardiac enlargement with mild fluid overload. Electronically Signed   By: Franchot Gallo M.D.   On: 02/15/2021 11:53     Echo: (12/18/2020)  IMPRESSIONS  1. Left ventricular ejection fraction, by estimation, is 35 to 40%. The  left ventricle has moderately decreased function. The left ventricle  demonstrates global hypokinesis. The left ventricular internal cavity size  was mildly dilated. There is mild  left ventricular hypertrophy. Left ventricular diastolic parameters are  consistent with Grade II diastolic dysfunction (pseudonormalization).  2. Right ventricular systolic function is normal. The right ventricular  size is normal.  3. Left atrial size was moderately dilated.  4. Right atrial size was mildly dilated.  5. The mitral valve is normal in structure. Moderate to severe mitral  valve regurgitation.  6. The aortic valve is calcified. Aortic valve regurgitation is mild.  Moderate aortic valve stenosis.   TELEMETRY: NSR  ASSESSMENT AND PLAN:  Principal Problem:   Symptomatic anemia Active Problems:   Iron deficiency anemia   Esophageal stricture   GERD (gastroesophageal reflux disease)   Type II diabetes mellitus with renal manifestations (HCC)   HTN (hypertension)   Stroke (HCC)   Hyperkalemia   Acute renal failure superimposed on stage 3a chronic kidney disease (HCC)   Chronic combined systolic and diastolic congestive heart failure ([place])   1. Chronic combined systolic and diastolic CHF (EF = 03-50%), uncompensated, patient symptomatic with dyspnea and fluid volume overload, BNP is 1515.3, hemodynamically stable             -Agree with restarting coreg, Lasix and spironolactone.  -Resume lisinopril after follow up with Dr. Ubaldo Glassing 1 week post discharge.             -Perform daily weights and monitor strict I's and  O's.             -Do not restrict sodium significantly at this time in the presence of hyponatremia.   -monitor bp closely.   -The patient should Follow up with Dr. Ubaldo Glassing 1 week post discharge.   2. Elevated troponin, likely due to demand ischemia, ACS is unlikely an etiology at this time             -We will resume CHF management as stated above.   3. Moderate Aortic Valve stenosis,, reasonably stable, recent echocardiogram reveals moderate aortic stenosis and significant mitral regurgitation             -Recommend continuing medical management at this time as the patient is a poor candidate for TAVR or SAVR.               4. Symptomatic  Anemia s/p blood transfusion with 1 unit of PRBC along with IV lasix, patient is followed by hematology as an outpatient             -Agree with current management.             -Patient is noted to be receiving IV iron later today as well.  5.  Hypertension with current hypotension, patient is stable at this time             -agree with restarting coreg at this time.  -recommend resuming lisinopril as outpatient.              -Recommend monitoring vital signs every 4 hours per protocol.             -Recommend following heart healthy diet.  6.  History of stroke, fairly stable, currently on DAPT             -Agree with resuming DAPT per GI recommendations and clearance.   Lysbeth Galas, RN notified to give the aspirin and Plavix and she verbalized understanding of all information.)  7.  Hyperkalemia, resolved, potassium level at 4.7 at this time.              -Recommend continuing lasix therapy at this time.  8.  Acute renal failure superimposed on CKD stage III, slight improvements in BUN/creatinine             -Agree with current management.             -Recommend trending CMP while inpatient.   -consider nephrology consult as outpatient.   9. GERD             -Agree with continued Protonix therapy.   10. Esophageal stricture s/p PEG tube  placement             -Agree with continued intermittent feeding supplement and water boluses.  11.  Diabetes mellitus type II, fairly controlled             -Agree with sliding scale insulin per protocol.  12.  Dyspnea with wheezing, stable, patient is currently on supplemental oxygen via nasal cannula at 2 L and has an O2 saturation >90%, stable             -Recommend RT evaluation and treatment.  -Recommend weaning O2 if possible.      [place], [id number]-AG  02/17/2021 10:11 AM

## 2021-02-17 NOTE — Discharge Instructions (Signed)
Cont your tube feedings as before

## 2021-02-17 NOTE — TOC Transition Note (Signed)
Transition of Care [facility]) - CM/SW Discharge Note   Patient Details  Name: [name] Day MRN: [id number] Date of Birth: 12/12/1928  [facility] CM/SW Contact:  Alberteen Sam, LCSW Phone Number: 02/17/2021, 9:21 AM   Clinical Narrative:     CSW met with daughter [name] and patient at bedside. Patient is active with [facility] home health services. CSW spoke with [place] with [facility] to inform of patient's discharge today and resumption of home health services, orders in for PT, RN and SLP. Patient's daughter [name] reports her and her sister provide home support and supervision to patient, and assist with her home feedings. No questions or concerns at this time.   Pending PT eval for oxygen needs, should patient need oxygen [name] is agreeable for patient to be set up with [employer].   [name] will transport patient home at time of discharge. Informed [name] if oxygen is needed ,will be delivered to room for safe transport and to home.   No other questions or concerns regarding discharge plan.   Final next level of care: [place] Barriers to Discharge: No Barriers Identified   Patient Goals and CMS Choice Patient states their goals for this hospitalization and ongoing recovery are:: to go home CMS Medicare.gov Compare Post Acute Care list provided to:: Patient Choice offered to / list presented to : Patient  Discharge Placement                  Name of family member notified: Juliette Alcide (daughter) Patient and family notified of of transfer: 02/17/21  Discharge Plan and Services                DME Arranged: Oxygen DME Agency: [employer] Date DME Agency Contacted: 02/17/21 Time DME Agency Contacted: [phone] Representative spoke with at DME Agency: Lou Cal Arranged: PT,RN,Speech Therapy HH Agency: [facility] Date [place]: 02/17/21 Time [place]: 0919 Representative spoke with at [place]: [place]  Social  Determinants of Health ([place]) Interventions     Readmission Risk Interventions Readmission Risk Prevention Plan 11/24/2019  Transportation Screening Complete  PCP or Specialist Appt within 3-5 Days Complete  HRI or [place] Complete  Medication Review (RN Care Manager) Complete  Some recent data might be hidden

## 2021-02-17 NOTE — Discharge Summary (Addendum)
[facility] NAME: [name]    MR#:  [id number]  DATE OF BIRTH:  May 01, 1929  DATE OF ADMISSION:  02/15/2021 ADMITTING PHYSICIAN: Ivor Costa, MD  DATE OF DISCHARGE: 02/17/2021  PRIMARY CARE PHYSICIAN: [name], MD    ADMISSION DIAGNOSIS:  SOB (shortness of breath) [R06.02] AKI (acute kidney injury) ([place]) [N17.9] Symptomatic anemia [D64.9]  DISCHARGE DIAGNOSIS:  Iron deficiency anemia acute on chronic status post blood transfusion and IV iron infusion Acute on chronic congestive heart failure systolic SECONDARY DIAGNOSIS:   Past Medical History:  Diagnosis Date  . Anemia    IRON INFUSIONS  . Aortic valve sclerosis   . Arrhythmia   . Arthritis    osteoarthritis  . Basal cell carcinoma of skin   . Brain tumor ([place])   . Brain tumor ([place])   . Cervical spine disease   . CHF (congestive heart failure) ([place])   . Diabetes mellitus without complication ([place])   . Dysrhythmia    sinus arrhythmia  . Esophageal stricture    severe, led to feeding tube placement in Sept 2019  . Esophageal ulcer without bleeding   . Gastrostomy tube dependent ([place])    DOES NOT EAT OR DRINK   . GERD (gastroesophageal reflux disease)   . History of kidney stones   . Hyperlipemia   . Hypertension   . Kidney stones   . Leaky heart valve   . Meningioma ([place])   . 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 Pulmonary hypertension ([place])   . RLS (restless legs syndrome)   . Vertigo     HOSPITAL COURSE:   [name] a 87 y.o.femalewith medical history significant ofHTN, HLD, DM, stroke, GERD,esophageal stricture with feeding tube placement, iron deficiency anemia,pulm HTN,occlusive mesenteric ischemia, CHF with EF 35-40%, aortic stenosis, CKD-IIIa,who presents with shortness breath.  Symptomatic anemiaand hx ofiron deficiency anemia: Per her daughter, patient is getting IV iron infusion intermittently. Her hemoglobin  dropped from 9.3 to6.3. Maybe due to occult GI blood loss.Dr. Bonna Gains of GI is consulted. -Held aspirin, Plavix-- no evidence of G.I. bleed will resume at discharge - came in with 6.3--s/p 1unit BT--8.3--7.8 -  IV pantoprazole40 mg bid--po PPI - Zofran IV for nausea - Avoid NSAIDs and SQ heparin - IV venofer 200 mg x1 -- patient sees Dr. Mike Gip hematology. She has not been seen since her visit in January. She will need to follow-up for routine IV iron infusions with Dr. Grayland Ormond. Case discussed with Dr. Grayland Ormond -- G.I. consultation with Dr. Bonna Gains appreciated. No evidence of active bleed. No plans for G.I. evaluation at present. Patient and family agreeable with the plan  Acute on chronic combined systolic and diastolic congestive heart failure:2D echo on 12/18/2020 showed EF 35-40% with grade 2 diastolic dysfunction. Patient has trace leg edema. Chest x-ray showed mild fluid overload. BNP 1500patient's blood pressures are soft, cannot treat with aggressive diuresis. -Received IV 20 mg of Lasix during blood transfusion x1 -resume home meds at discharge -- no respiratory distress. Sats 95 to 99% on 2 L. Assess for home oxygen need  chronic esophageal stricture -Continue feeding tube. Patient follows with [facility]  GERD (gastroesophageal reflux disease) - onIV Protonix  Type II diabetes mellitus with renal manifestations (HCC):Recent A1c 6.9, well controlled. Patient taking glipizide and metformin -SSI  HTN (hypertension):Blood pressure soft -resume Lasix, lisinopril, spironolactone, Coreg  Receny stroke (HCC) -resumed aspirin, Plavix  Hyperkalemia: Potassium 6.1-- 4.7 -Treated  with D50, calcium gluconate, 5 units of NovoLog -Leukoma 10 g x1 -- resolved  Acute renal failure superimposed on stage 3a chronic kidney disease (HCC):Baseline creatinine 0.80-1.0. Her creatinine is 1.50, BUN 72, likely due to dehydration and continuation of diuretics and  lisinopril. -Hold Lasix, lisinopril, spironolactone --creat 1.5--> 1.4    palliative care input appreciated. Goals of care discuss. Patient is DNR DNI  DVT ppx: SCD Code Status: DNR DNI  family Communication:  discharge plan discussed with dter [name] at bedside Disposition Plan: Anticipate discharge back to previous environment Consults called:Dr. Bonna Gains of GI Admission status and Level of care:Progressive Cardiac   Status is: Inpatient     Dispo: The patient is from:Home Anticipated d/c is [id number] with resumption of home health services Patient currently is best optimized for discharge Difficult to place patient No  patient will discharged home after seen by PT CONSULTS OBTAINED:  Treatment Team:  Yolonda Kida, MD  DRUG ALLERGIES:   Allergies  Allergen Reactions  . Elemental Sulfur Diarrhea and Nausea And Vomiting  . Gabapentin Swelling  . Lipitor [Atorvastatin] Other (See Comments)    Muscle aches  . Mevacor [Lovastatin] Other (See Comments)    Muscle aches  . Milk-Related Compounds Other (See Comments)    Large quantities cause headaches   . Septra [Sulfamethoxazole-Trimethoprim] Other (See Comments)    Unknown  . Statins Other (See Comments)    Muscle pain  . Zocor [Simvastatin] Other (See Comments)    Muscle aches    DISCHARGE MEDICATIONS:   Allergies as of 02/17/2021      Reactions   Elemental Sulfur Diarrhea, Nausea And Vomiting   Gabapentin Swelling   Lipitor [atorvastatin] Other (See Comments)   Muscle aches   Mevacor [lovastatin] Other (See Comments)   Muscle aches   Milk-related Compounds Other (See Comments)   Large quantities cause headaches    Septra [sulfamethoxazole-trimethoprim] Other (See Comments)   Unknown   Statins Other (See Comments)   Muscle pain   Zocor [simvastatin] Other (See Comments)   Muscle aches      Medication List    TAKE these medications    acetaminophen 160 MG/5ML solution Commonly known as: TYLENOL Place 20.3 mLs (650 mg total) into feeding tube every 6 (six) hours as needed for mild pain.   aspirin 81 MG chewable tablet Place 1 tablet (81 mg total) into feeding tube daily.   carvedilol 3.125 MG tablet Commonly known as: COREG Place 1 tablet (3.125 mg total) into feeding tube 2 (two) times daily with a meal.   clopidogrel 75 MG tablet Commonly known as: PLAVIX GIVE 1 TABLET VIA FEEDING TUBE EVERY [name] AS DIRECTED   feeding supplement (KATE FARMS STANDARD 1.4) Liqd liquid Place 325 mLs into feeding tube 4 (four) times daily.   free water Soln Place 100 mLs into feeding tube every 6 (six) hours.   furosemide 20 MG tablet Commonly known as: LASIX Place 0.5 tablets (10 mg total) into feeding tube daily.   glipiZIDE 5 MG tablet Commonly known as: GLUCOTROL Take 5 mg by mouth in the morning.   glipiZIDE 2.5 MG 24 hr tablet Commonly known as: GLUCOTROL XL Take 2.5 mg by mouth at bedtime.   hydrocerin Crea Apply 1 application topically 2 (two) times daily. To bilateral feet   Hypromellose 0.2 % Soln Place 1 drop into both eyes 3 (three) times daily as needed (dry eyes).   lisinopril 2.5 MG tablet Commonly known as: ZESTRIL Take 1 tablet (2.5  mg total) by mouth daily.   metFORMIN 500 MG tablet Commonly known as: GLUCOPHAGE Place 1 tablet (500 mg total) into feeding tube 2 (two) times daily with a meal. What changed: when to take this   multivitamin Liqd Place 15 mLs into feeding tube daily.   pantoprazole sodium 40 mg/20 mL Pack Commonly known as: PROTONIX Place 20 mLs (40 mg total) into feeding tube daily.   polyethylene glycol 17 g packet Commonly known as: MIRALAX / GLYCOLAX Place 17 g into feeding tube daily as needed.   polyvinyl alcohol 1.4 % ophthalmic solution Commonly known as: LIQUIFILM TEARS Place 1 drop into both eyes as needed for dry eyes.   rOPINIRole 2 MG tablet Commonly known  as: REQUIP Place 1 tablet (2 mg total) into feeding tube 2 (two) times daily.   spironolactone 25 MG tablet Commonly known as: ALDACTONE Take 25 mg by mouth in the morning.       If you experience worsening of your admission symptoms, develop shortness of breath, life threatening emergency, suicidal or homicidal thoughts you must seek medical attention immediately by calling 911 or calling your MD immediately  if symptoms less severe.  You Must read complete instructions/literature along with all the possible adverse reactions/side effects for all the Medicines you take and that have been prescribed to you. Take any new Medicines after you have completely understood and accept all the possible adverse reactions/side effects.   Please note  You were cared for by a hospitalist during your hospital stay. If you have any questions about your discharge medications or the care you received while you were in the hospital after you are discharged, you can call the unit and asked to speak with the hospitalist on call if the hospitalist that took care of you is not available. Once you are discharged, your primary care physician will handle any further medical issues. Please note that NO REFILLS for any discharge medications will be authorized once you are discharged, as it is imperative that you return to your primary care physician (or establish a relationship with a primary care physician if you do not have one) for your aftercare needs so that they can reassess your need for medications and monitor your lab values. Today   SUBJECTIVE   No new complaints. Uneventful night. Denies shortness of breath. No report of G.I. bleed  VITAL SIGNS:  Blood pressure (!) 114/51, pulse 89, temperature 98.3 F (36.8 C), temperature source Oral, resp. rate 18, height 5\' 7"  (1.702 m), weight 55.8 kg, SpO2 98 %.  I/O:    Intake/Output Summary (Last 24 hours) at 02/17/2021 1121 Last data filed at 02/16/2021  2015 Gross per 24 hour  Intake 10 ml  Output 500 ml  Net -490 ml    PHYSICAL EXAMINATION:  GENERAL:  87 y.o.-year-old patient lying in the bed with no acute distress.  LUNGS: Normal breath sounds bilaterally, no wheezing, rales,rhonchi or crepitation. No use of accessory muscles of respiration.  CARDIOVASCULAR: S1, S2 normal. No murmurs, rubs, or gallops.  ABDOMEN: Soft, non-tender, non-distended. Bowel sounds present. No organomegaly or mass. PEG + EXTREMITIES: No pedal edema, cyanosis, or clubbing.  NEUROLOGIC: nonfocal PSYCHIATRIC: The patient is alert and oriented x 3.  SKIN: No obvious rash, lesion, or ulcer.   DATA REVIEW:   CBC  Recent Labs  Lab 02/17/21 0524  WBC 12.5*  HGB 7.9*  HCT 25.3*  PLT 344    Chemistries  Recent Labs  Lab 02/16/21 0009  NA 134*  K 4.7  CL 102  CO2 22  GLUCOSE 115*  BUN 64*  CREATININE 1.40*  CALCIUM 8.2*    Microbiology Results   Recent Results (from the past 240 hour(s))  Resp Panel by RT-PCR (Flu A&B, Covid) Nasopharyngeal Swab     Status: None   Collection Time: 02/15/21 12:48 PM   Specimen: Nasopharyngeal Swab; Nasopharyngeal(NP) swabs in vial transport medium  Result Value Ref Range Status   SARS Coronavirus 2 by RT PCR NEGATIVE NEGATIVE Final    Comment: (NOTE) SARS-CoV-2 target nucleic acids are NOT DETECTED.  The SARS-CoV-2 RNA is generally detectable in upper respiratory specimens during the acute phase of infection. The lowest concentration of SARS-CoV-2 viral copies this assay can detect is 138 copies/mL. A negative result does not preclude SARS-Cov-2 infection and should not be used as the sole basis for treatment or other patient management decisions. A negative result may occur with  improper specimen collection/handling, submission of specimen other than nasopharyngeal swab, presence of viral mutation(s) within the areas targeted by this assay, and inadequate number of viral copies(<138 copies/mL). A  negative result must be combined with clinical observations, patient history, and epidemiological information. The expected result is Negative.  Fact Sheet for Patients:  EntrepreneurPulse.com.au  Fact Sheet for Healthcare Providers:  IncredibleEmployment.be  This test is no t yet approved or cleared by the [place] FDA and  has been authorized for detection and/or diagnosis of SARS-CoV-2 by FDA under an Emergency Use Authorization (EUA). This EUA will remain  in effect (meaning this test can be used) for the duration of the COVID-19 declaration under Section 564(b)(1) of the Act, 21 U.S.C.section 360bbb-3(b)(1), unless the authorization is terminated  or revoked sooner.       Influenza A by PCR NEGATIVE NEGATIVE Final   Influenza B by PCR NEGATIVE NEGATIVE Final    Comment: (NOTE) The Xpert Xpress SARS-CoV-2/FLU/RSV plus assay is intended as an aid in the diagnosis of influenza from Nasopharyngeal swab specimens and should not be used as a sole basis for treatment. Nasal washings and aspirates are unacceptable for Xpert Xpress SARS-CoV-2/FLU/RSV testing.  Fact Sheet for Patients: EntrepreneurPulse.com.au  Fact Sheet for Healthcare Providers: IncredibleEmployment.be  This test is not yet approved or cleared by the [place] FDA and has been authorized for detection and/or diagnosis of SARS-CoV-2 by FDA under an Emergency Use Authorization (EUA). This EUA will remain in effect (meaning this test can be used) for the duration of the COVID-19 declaration under Section 564(b)(1) of the Act, 21 U.S.C. section 360bbb-3(b)(1), unless the authorization is terminated or revoked.  Performed at [name], [address] [place], [place] [address]     RADIOLOGY:  DG Chest 2 View  Result Date: 02/15/2021 CLINICAL DATA:  Short of breath, leg swelling EXAM: CHEST - 2 VIEW COMPARISON:   12/17/2020 FINDINGS: Cardiac enlargement. Mild vascular congestion. Possible mild interstitial edema. Small bilateral pleural effusions. Negative for pneumonia. Atherosclerotic calcification aorta Thoracic disc degeneration and scoliosis.  ACDF lower cervical spine IMPRESSION: Cardiac enlargement with mild fluid overload. Electronically Signed   By: Franchot Gallo M.D.   On: 02/15/2021 11:53     CODE STATUS:     Code Status Orders  (From admission, onward)         Start     Ordered   02/16/21 1446  Do not attempt resuscitation (DNR)  Continuous       Question Answer Comment  In the event of cardiac  or respiratory ARREST Do not call a "code blue"   In the event of cardiac or respiratory ARREST Do not perform Intubation, CPR, defibrillation or ACLS   In the event of cardiac or respiratory ARREST Use medication by any route, position, wound care, and other measures to relive pain and suffering. May use oxygen, suction and manual treatment of airway obstruction as needed for comfort.   Comments MOST form on chart.      02/16/21 1445        Code Status History    Date Active Date Inactive Code Status Order ID Comments User Context   02/15/2021 1847 02/16/2021 1445 Full Code [id number]  Ivor Costa, MD ED   12/22/2020 1320 01/01/2021 1511 Full Code [id number]  Flora Lipps Inpatient   12/17/2020 1929 12/22/2020 1300 Full Code [id number]  Rust-Chester, Huel Cote, NP ED   11/23/2019 1331 11/26/2019 1713 Full Code [id number]  Para Skeans, MD ED   06/23/2018 1723 06/25/2018 1835 Full Code [id number]  Nicholes Mango, MD Inpatient   06/11/2018 1559 06/13/2018 1803 Full Code [id number]  Vaughan Basta, MD Inpatient   03/18/2017 1033 03/18/2017 1544 Full Code [id number]  Algernon Huxley, MD Inpatient   Advance Care Planning Activity    Advance Directive Documentation   Flowsheet Row Most Recent Value  Type of Advance Directive Healthcare Power of Attorney, Living will  Pre-existing out of facility DNR  order (yellow form or pink MOST form) --  "MOST" Form in Place? --       TOTAL TIME TAKING CARE OF THIS PATIENT: 40 minutes.    Fritzi Mandes M.D  [facility]    CC: Primary care physician; [name], MD

## 2021-02-17 NOTE — Evaluation (Signed)
Physical Therapy Evaluation Patient Details Name: [name] Day MRN: [id number] DOB: [birth year] Today's Date: 02/17/2021   History of Present Illness  presented to ER secondary to progressive SOB x2 days; admitted for management of symptomatic anemia.  Clinical Impression  Patient resting in bed, daughter at bedside, upon arrival to room.  Alert and oriented, follows commands.  Denies pain at this time.  Mild expressive aphasia noted (baseline for patient), but able to effectively communicate wants/needs with increased time/effort.  Mild R UE > LE motor planning/coordination deficits noted, unchanged with this admission.  Currently able to complete bed mobility with min assist; sit/stand, basic transfers and gait (3') with RW, cga/min assist.  Demonstrates partially reciprocal stepping pattern; decreased step height/length R LE; increased effort required to maintain R UE grasp on RW. Slow and deliberate, broad turning radius (indicative of increased fall risk); do recommend continued use of RW at all times. Patient voiced understanding and agreement. Of note, trialed on RA throughout session; sats >91% at rest and with exertion throughout session.  Left on RA end of session; RN/MD informed/aware. Would benefit from skilled PT to address above deficits and promote optimal return to PLOF.; Recommend transition to HHPT upon discharge from acute hospitalization.     Follow Up Recommendations Home health PT    Equipment Recommendations       Recommendations for Other Services       Precautions / Restrictions Precautions Precautions: Fall Restrictions Weight Bearing Restrictions: No      Mobility  Bed Mobility Overal bed mobility: Needs Assistance Bed Mobility: Supine to Sit;Sit to Supine     Supine to sit: Min assist Sit to supine: Supervision   General bed mobility comments: assist for truncal elevation with supine to sit    Transfers Overall transfer level: Needs  assistance Equipment used: Rolling walker (2 wheeled) Transfers: Sit to/from Stand Sit to Stand: Min assist         General transfer comment: cuing for hand placement; increased time/attention for R UE placement/grasp on RW  Ambulation/Gait Ambulation/Gait assistance: Min assist Gait Distance (Feet): 50 Feet Assistive device: Rolling walker (2 wheeled)       General Gait Details: partially reciprocal stepping pattern; decreased step height/length R LE; increased effort required to maintain R UE grasp on RW. Slow and deliberate, broad turning radius (indicative of increased fall risk); do recommend continued use of RW at all times. Patient voiced understanding and agreement.  Stairs            Wheelchair Mobility    Modified Rankin (Stroke Patients Only)       Balance   Sitting-balance support: No upper extremity supported;Feet supported Sitting balance-Leahy Scale: Good     Standing balance support: Bilateral upper extremity supported Standing balance-Leahy Scale: Fair                               Pertinent Vitals/Pain Pain Assessment: No/denies pain    Home Living Family/patient expects to be discharged to:: Private residence Living Arrangements: Other relatives Available Help at Discharge: Family;Available 24 hours/day Type of Home: House Home Access: Ramped entrance     Home Layout: One level Home Equipment: Walker - 2 wheels;Walker - 4 wheels;Cane - quad;Bedside commode      Prior Function Level of Independence: Independent with assistive device(s)         Comments: Sup with RW for ADLs, household mobilization; denies recent fall history;  no home O2.     Hand Dominance   Dominant Hand: Right    Extremity/Trunk Assessment   Upper Extremity Assessment Upper Extremity Assessment:  (R UE grossly 4-/5, generalized paresthesia and decreased motor control, baseline due to prior CVA; L UE grossly WFL)    Lower Extremity  Assessment Lower Extremity Assessment:  (R LE grossly 4/5 throughout; L LE grossly 4+ to 5/5 throughout)       Communication   Communication: Expressive difficulties (due to baseline CVA)  Cognition Arousal/Alertness: Awake/alert Behavior During Therapy: WFL for tasks assessed/performed Overall Cognitive Status: Within Functional Limits for tasks assessed                                        General Comments      Exercises Other Exercises Other Exercises: Toilet transfer, SPT without assist device, min assist; requires constant UE support/assist.  Sit/stand from [facility] without assist device, min assist; dep for hygiene/peri-care due to decreased coordination R UE.   Assessment/Plan    PT Assessment Patient needs continued PT services  PT Problem List Decreased strength;Decreased activity tolerance;Decreased balance;Decreased range of motion;Decreased mobility;Decreased cognition;Decreased knowledge of precautions;Decreased coordination;Decreased knowledge of use of DME;Decreased safety awareness;Cardiopulmonary status limiting activity       PT Treatment Interventions DME instruction;Gait training;Cognitive remediation;Functional mobility training;Therapeutic activities;Therapeutic exercise;Balance training;Patient/family education    PT Goals (Current goals can be found in the Care Plan section)  Acute Rehab PT Goals Patient Stated Goal: to return home with daughter PT Goal Formulation: With patient Time For Goal Achievement: 03/03/21 Potential to Achieve Goals: Good    Frequency Min 2X/week   Barriers to discharge        Co-evaluation               AM-PAC PT "6 Clicks" Mobility  Outcome Measure Help needed turning from your back to your side while in a flat bed without using bedrails?: None Help needed moving from lying on your back to sitting on the side of a flat bed without using bedrails?: A Little Help needed moving to and from a bed to a  chair (including a wheelchair)?: A Little Help needed standing up from a chair using your arms (e.g., wheelchair or bedside chair)?: A Little Help needed to walk in hospital room?: A Little Help needed climbing 3-5 steps with a railing? : A Little 6 Click Score: 19    End of Session Equipment Utilized During Treatment: Gait belt Activity Tolerance: Patient tolerated treatment well Patient left: in bed;with call bell/phone within reach;with bed alarm set Nurse Communication: Mobility status PT Visit Diagnosis: Muscle weakness (generalized) (M62.81);Difficulty in walking, not elsewhere classified (R26.2)    Time: 1010-1043 PT Time Calculation (min) (ACUTE ONLY): 33 min   Charges:   PT Evaluation $PT Eval Moderate Complexity: 1 Mod PT Treatments $Therapeutic Activity: 8-22 mins        Zahriyah Joo H. Owens Shark, PT, DPT, NCS 02/17/21, 2:51 PM [phone]

## 2021-02-24 ENCOUNTER — Other Ambulatory Visit: Payer: Self-pay

## 2021-02-24 ENCOUNTER — Inpatient Hospital Stay: Payer: Medicare Other

## 2021-02-24 ENCOUNTER — Encounter: Payer: Self-pay | Admitting: Oncology

## 2021-02-24 ENCOUNTER — Inpatient Hospital Stay: Payer: Medicare Other | Attending: Hematology and Oncology | Admitting: Oncology

## 2021-02-24 VITALS — BP 109/42 | HR 64 | Resp 20

## 2021-02-24 DIAGNOSIS — D509 Iron deficiency anemia, unspecified: Secondary | ICD-10-CM

## 2021-02-24 MED ORDER — SODIUM CHLORIDE 0.9 % IV SOLN
INTRAVENOUS | Status: DC
  Filled 2021-02-24: qty 250

## 2021-02-24 MED ORDER — IRON SUCROSE 20 MG/ML IV SOLN
200.0000 mg | Freq: Once | INTRAVENOUS | Status: AC
  Administered 2021-02-24: 200 mg via INTRAVENOUS
  Filled 2021-02-24: qty 10

## 2021-02-24 NOTE — Progress Notes (Signed)
[facility]  [address], [address] [place],  [address] Phone: [phone]  Fax: [phone]   Clinic Day:  02/24/2021  Referring physician: [name], MD  Chief Complaint: [name] Day is a 87 y.o. female with iron deficiency anemia who is seen for hospital follow-up.   HPI: The patient was last seen in the hematology clinic on 10/04/2020.  She received 1 dose of IV Venofer on 10/04/20.   She was hospitalized from 12/17/20-01/01/21 for shortness of breath and pulmonary edema along with right-sided weakness and difficulty speaking due to left MCA infarct as well as incidental small aneurysm.  She was discharged to current inpatient rehab.    She was hospitalized from 02/15/2021 - 02/17/2021 for symptomatic anemia, shortness of breath and AKI.  On admission she was found to have a hemoglobin of 6.3 (9.3).  She was given 1 dose of IV Venofer and received 1 unit packed red blood cells.  She did not appear to be overtly bleeding and case was discussed with GI.  GI work-up was negative.  She presents back today for hospital follow-up.  Reports feeling tired.  Has a residual right-sided weakness secondary to stroke. Has stable aphasia.  She receives all of her nutrients through her PEG tube.  She is unable to care for herself at home so one of her children stay with her at all times.  Past Medical History:  Diagnosis Date  . Anemia    IRON INFUSIONS  . Aortic valve sclerosis   . Arrhythmia   . Arthritis    osteoarthritis  . Basal cell carcinoma of skin   . Brain tumor ([place])   . Brain tumor ([place])   . Cervical spine disease   . CHF (congestive heart failure) ([place])   . Diabetes mellitus without complication ([place])   . Dysrhythmia    sinus arrhythmia  . Esophageal stricture    severe, led to feeding tube placement in Sept 2019  . Esophageal ulcer without bleeding   . Gastrostomy tube dependent ([place])    DOES NOT EAT OR DRINK   . GERD  (gastroesophageal reflux disease)   . History of kidney stones   . Hyperlipemia   . Hypertension   . Kidney stones   . Leaky heart valve   . Meningioma ([place])   . Occlusive mesenteric ischemia ([place])   . Pulmonary hypertension ([place])   . RLS (restless legs syndrome)   . Vertigo     Past Surgical History:  Procedure Laterality Date  . ABDOMINAL HYSTERECTOMY    . APPENDECTOMY    . ARTHROGRAM KNEE Left   . BACK SURGERY     CERVIVAL NECK FUSION  . CATARACT EXTRACTION W/PHACO Left 11/11/2018   Procedure: CATARACT EXTRACTION PHACO AND INTRAOCULAR LENS PLACEMENT (IOC) LEFT, DIABETIC;  Surgeon: Birder Robson, MD;  Location: [facility] ORS;  Service: Ophthalmology;  Laterality: Left;  [place]  00:41 CDE 6.41 Fluid pack lot # [id number]  . COLONOSCOPY WITH PROPOFOL N/A 06/20/2017   Procedure: COLONOSCOPY WITH PROPOFOL;  Surgeon: Lollie Sails, MD;  Location: [facility] ENDOSCOPY;  Service: Endoscopy;  Laterality: N/A;  . ESOPHAGOGASTRODUODENOSCOPY (EGD) WITH PROPOFOL N/A 03/14/2015   Procedure: ESOPHAGOGASTRODUODENOSCOPY (EGD) WITH PROPOFOL;  Surgeon: Josefine Class, MD;  Location: [facility] ENDOSCOPY;  Service: Endoscopy;  Laterality: N/A;  . ESOPHAGOGASTRODUODENOSCOPY (EGD) WITH PROPOFOL N/A 03/28/2017   Procedure: ESOPHAGOGASTRODUODENOSCOPY (EGD) WITH PROPOFOL;  Surgeon: Lollie Sails, MD;  Location: [facility] ENDOSCOPY;  Service: Endoscopy;  Laterality: N/A;  .  ESOPHAGOGASTRODUODENOSCOPY (EGD) WITH PROPOFOL N/A 06/20/2017   Procedure: ESOPHAGOGASTRODUODENOSCOPY (EGD) WITH PROPOFOL;  Surgeon: Lollie Sails, MD;  Location: [facility] ENDOSCOPY;  Service: Endoscopy;  Laterality: N/A;  . ESOPHAGOGASTRODUODENOSCOPY (EGD) WITH PROPOFOL N/A 10/21/2017   Procedure: ESOPHAGOGASTRODUODENOSCOPY (EGD) WITH PROPOFOL;  Surgeon: Lollie Sails, MD;  Location: [facility] ENDOSCOPY;  Service: Endoscopy;  Laterality: N/A;  . ESOPHAGOGASTRODUODENOSCOPY (EGD) WITH PROPOFOL N/A 04/15/2018   Procedure: ESOPHAGOGASTRODUODENOSCOPY  (EGD) WITH PROPOFOL;  Surgeon: Lollie Sails, MD;  Location: [facility] ENDOSCOPY;  Service: Endoscopy;  Laterality: N/A;  . ESOPHAGUS SURGERY     CLOSURE  . EYE SURGERY    . HYSTERECTOMY ABDOMINAL WITH SALPINGECTOMY    . IR GASTROSTOMY TUBE MOD SED  06/11/2018  . IR GASTROSTOMY TUBE REMOVAL  03/25/2019  . IR REPLACE G-TUBE SIMPLE WO FLUORO  10/19/2020  . PERIPHERAL VASCULAR CATHETERIZATION N/A 01/23/2016   Procedure: Visceral Venography;  Surgeon: Algernon Huxley, MD;  Location: [facility] CV LAB;  Service: Cardiovascular;  Laterality: N/A;  . PERIPHERAL VASCULAR CATHETERIZATION  01/23/2016   Procedure: Peripheral Vascular Intervention;  Surgeon: Algernon Huxley, MD;  Location: [facility];  Service: Cardiovascular;;  . UPPER ESOPHAGEAL ENDOSCOPIC ULTRASOUND (EUS) N/A 12/05/2017   Procedure: UPPER ESOPHAGEAL ENDOSCOPIC ULTRASOUND (EUS);  Surgeon: Jola Schmidt, MD;  Location: [facility] ENDOSCOPY;  Service: Endoscopy;  Laterality: N/A;  . VISCERAL ANGIOGRAPHY N/A 03/18/2017   Procedure: Visceral Angiography;  Surgeon: Algernon Huxley, MD;  Location: [facility] LAB;  Service: Cardiovascular;  Laterality: N/A;  . VISCERAL ARTERY INTERVENTION N/A 03/18/2017   Procedure: Visceral Artery Intervention;  Surgeon: Algernon Huxley, MD;  Location: [facility];  Service: Cardiovascular;  Laterality: N/A;    Family History  Problem Relation Age of Onset  . Stroke Mother   . Hypertension Mother   . Heart disease Mother   . Cancer Father   . Heart disease Father   . Heart attack Sister   . Diabetes Sister   . Heart attack Brother     Social History:  reports that she has never smoked. She has never used smokeless tobacco. She reports that she does not drink alcohol and does not use drugs.  She is retired. She worked at [employer]. She lives in [place].Patient's contact number is [phone]. Daughter's number is [phone] [name]). The patient is accompanied by her daughter,  [name] today.  Allergies:  Allergies  Allergen Reactions  . Elemental Sulfur Diarrhea and Nausea And Vomiting  . Gabapentin Swelling  . Lipitor [Atorvastatin] Other (See Comments)    Muscle aches  . Mevacor [Lovastatin] Other (See Comments)    Muscle aches  . Milk-Related Compounds Other (See Comments)    Large quantities cause headaches   . Septra [Sulfamethoxazole-Trimethoprim] Other (See Comments)    Unknown  . Statins Other (See Comments)    Muscle pain  . Zocor [Simvastatin] Other (See Comments)    Muscle aches    Current Medications: Current Outpatient Medications  Medication Sig Dispense Refill  . acetaminophen (TYLENOL) 160 MG/5ML solution Place 20.3 mLs (650 mg total) into feeding tube every 6 (six) hours as needed for mild pain. 120 mL 0  . aspirin 81 MG chewable tablet Place 1 tablet (81 mg total) into feeding tube daily. 30 tablet 0  . carvedilol (COREG) 3.125 MG tablet Place 1 tablet (3.125 mg total) into feeding tube 2 (two) times daily with a meal. 30 tablet 3  . clopidogrel (PLAVIX) 75 MG  tablet GIVE 1 TABLET VIA FEEDING TUBE EVERY DAY AS DIRECTED 30 tablet 2  . furosemide (LASIX) 20 MG tablet Place 0.5 tablets (10 mg total) into feeding tube daily. (Patient taking differently: Place 40 mg into feeding tube daily.) 30 tablet 0  . glipiZIDE (GLUCOTROL XL) 2.5 MG 24 hr tablet Take 2.5 mg by mouth at bedtime.    Marland Kitchen glipiZIDE (GLUCOTROL) 5 MG tablet Take 5 mg by mouth in the morning.    . hydrocerin (EUCERIN) CREA Apply 1 application topically 2 (two) times daily. To bilateral feet  0  . Hypromellose 0.2 % SOLN Place 1 drop into both eyes 3 (three) times daily as needed (dry eyes).     Marland Kitchen lisinopril (ZESTRIL) 2.5 MG tablet Take 1 tablet (2.5 mg total) by mouth daily. 30 tablet 0  . metFORMIN (GLUCOPHAGE) 500 MG tablet Place 1 tablet (500 mg total) into feeding tube 2 (two) times daily with a meal. 30 tablet 1  . Multiple Vitamin (MULTIVITAMIN) LIQD Place 15 mLs  into feeding tube daily. 450 mL 1  . Nutritional Supplements (FEEDING SUPPLEMENT, KATE FARMS STANDARD 1.4,) LIQD liquid Place 325 mLs into feeding tube 4 (four) times daily.    . pantoprazole sodium (PROTONIX) 40 mg/20 mL PACK Place 20 mLs (40 mg total) into feeding tube daily. 600 mL 0  . polyethylene glycol (MIRALAX / GLYCOLAX) 17 g packet Place 17 g into feeding tube daily as needed. 14 each 0  . polyvinyl alcohol (LIQUIFILM TEARS) 1.4 % ophthalmic solution Place 1 drop into both eyes as needed for dry eyes. 15 mL 0  . rOPINIRole (REQUIP) 2 MG tablet Place 1 tablet (2 mg total) into feeding tube 2 (two) times daily. 60 tablet 0  . spironolactone (ALDACTONE) 25 MG tablet Take 25 mg by mouth in the morning.    . Water For Irrigation, Sterile (FREE WATER) SOLN Place 100 mLs into feeding tube every 6 (six) hours.     No current facility-administered medications for this visit.    Review of Systems  Constitutional: Positive for malaise/fatigue. Negative for chills, fever and weight loss.  HENT: Negative for congestion, ear pain and tinnitus.   Eyes: Negative.  Negative for blurred vision and double vision.  Respiratory: Negative.  Negative for cough, sputum production and shortness of breath.   Cardiovascular: Negative.  Negative for chest pain, palpitations and leg swelling.  Gastrointestinal: Negative.  Negative for abdominal pain, constipation, diarrhea, nausea and vomiting.  Genitourinary: Negative for dysuria, frequency and urgency.  Musculoskeletal: Negative for back pain and falls.  Skin: Negative.  Negative for rash.  Neurological: Positive for weakness. Negative for headaches.  Endo/Heme/Allergies: Negative.  Does not bruise/bleed easily.  Psychiatric/Behavioral: Negative.  Negative for depression. The patient is not nervous/anxious and does not have insomnia.    Performance status (ECOG): 2  Vitals Blood pressure (!) 114/51, pulse 64, temperature 97.8 F (36.6 C), resp. rate 20,  weight 115 lb 8 oz (52.4 kg), SpO2 100 %.   Physical Exam Constitutional:      Appearance: She is well-developed.  HENT:     Head: Normocephalic and atraumatic.  Eyes:     Pupils: Pupils are equal, round, and reactive to light.  Cardiovascular:     Rate and Rhythm: Normal rate and regular rhythm.     Heart sounds: No murmur heard.   Pulmonary:     Effort: Pulmonary effort is normal.     Breath sounds: Normal breath sounds. No wheezing.  Abdominal:  General: Bowel sounds are normal. There is no distension.     Palpations: Abdomen is soft. There is no mass.     Tenderness: There is no abdominal tenderness.  Musculoskeletal:        General: Normal range of motion.     Cervical back: Normal range of motion.     Comments: Right side residual weaknessd/t stroke  Skin:    General: Skin is warm and dry.  Neurological:     Mental Status: She is alert and oriented to person, place, and time.  Psychiatric:        Behavior: Behavior normal.     No visits with results within 3 Day(s) from this visit.  Latest known visit with results is:  Admission on 02/15/2021, Discharged on 02/17/2021  Component Date Value Ref Range Status  . WBC 02/15/2021 11.7* 4.0 - 10.5 K/uL Final  . RBC 02/15/2021 2.37* 3.87 - 5.11 MIL/uL Final  . Hemoglobin 02/15/2021 6.3* 12.0 - 15.0 g/dL Final  . HCT 02/15/2021 20.6* 36.0 - 46.0 % Final  . MCV 02/15/2021 86.9  80.0 - 100.0 fL Final  . MCH 02/15/2021 26.6  26.0 - 34.0 pg Final  . MCHC 02/15/2021 30.6  30.0 - 36.0 g/dL Final  . RDW 02/15/2021 15.0  11.5 - 15.5 % Final  . Platelets 02/15/2021 387  150 - 400 K/uL Final  . nRBC 02/15/2021 0.0  0.0 - 0.2 % Final   Performed at [facility], [address] [place], [place] [address]  . Sodium 02/15/2021 132* 135 - 145 mmol/L Final  . Potassium 02/15/2021 6.1* 3.5 - 5.1 mmol/L Final  . Chloride 02/15/2021 99  98 - 111 mmol/L Final  . CO2 02/15/2021 22  22 - 32 mmol/L Final  . Glucose, Bld  02/15/2021 256* 70 - 99 mg/dL Final   Glucose reference range applies only to samples taken after fasting for at least 8 hours.  . BUN 02/15/2021 72* 8 - 23 mg/dL Final  . Creatinine, Ser 02/15/2021 1.50* 0.44 - 1.00 mg/dL Final  . Calcium 02/15/2021 8.5* 8.9 - 10.3 mg/dL Final  . GFR, Estimated 02/15/2021 33* >60 mL/min Final   Comment: (NOTE) Calculated using the CKD-EPI Creatinine Equation (2021)   . Anion gap 02/15/2021 11  5 - 15 Final   Performed at [facility], [address] [place], [place] [address]  . Order Confirmation 02/15/2021    Final                   Value:ORDER PROCESSED BY BLOOD BANK Performed at [facility], [address] [place], [place] [address]   . B Natriuretic Peptide 02/15/2021 1,515.3* 0.0 - 100.0 pg/mL Final   Performed at [facility], [address] [place], [place] [address]  . Troponin I (High Sensitivity) 02/15/2021 227* <18 ng/L Final   Comment: CRITICAL RESULT CALLED TO, READ BACK BY AND VERIFIED WITH KATIE FURGUSON@1315  ON 02/15/21 BY HKP (NOTE) Elevated high sensitivity troponin I (hsTnI) values and significant  changes across serial measurements may suggest ACS but many other  chronic and acute conditions are known to elevate hsTnI results.  Refer to the "Links" section for chest pain algorithms and additional  guidance. Performed at [facility], [address], [place], [place] [address]   . Vitamin B-12 02/15/2021 525  180 - 914 pg/mL Final   Comment: (NOTE) This assay is not validated for testing neonatal or myeloproliferative syndrome specimens for Vitamin B12  levels. Performed at [facility], [place] [address], [place], [place] [address]   . Folate 02/15/2021 52.0  >5.9 ng/mL Final   Comment: RESULT CONFIRMED BY MANUAL DILUTION SS Performed at [facility], [address] [place], [place] [address]   . Iron 02/15/2021 19* 28 - 170 ug/dL Final  . TIBC 02/15/2021 447  250  - 450 ug/dL Final  . Saturation Ratios 02/15/2021 4* 10.4 - 31.8 % Final  . UIBC 02/15/2021 428  ug/dL Final   Performed at [facility], [address], [place], [place] [address]  . Ferritin 02/15/2021 8* 11 - 307 ng/mL Final   Performed at [facility], [address] [place], [place] [address]  . Retic Ct Pct 02/15/2021 4.0* 0.4 - 3.1 % Final  . RBC. 02/15/2021 2.38* 3.87 - 5.11 MIL/uL Final  . Retic Count, Absolute 02/15/2021 96.2  19.0 - 186.0 K/uL Final  . Immature Retic Fract 02/15/2021 31.9* 2.3 - 15.9 % Final   Performed at [facility], [address] [place], [place] [address]  . SARS Coronavirus 2 by RT PCR 02/15/2021 NEGATIVE  NEGATIVE Final   Comment: (NOTE) SARS-CoV-2 target nucleic acids are NOT DETECTED.  The SARS-CoV-2 RNA is generally detectable in upper respiratory specimens during the acute phase of infection. The lowest concentration of SARS-CoV-2 viral copies this assay can detect is 138 copies/mL. A negative result does not preclude SARS-Cov-2 infection and should not be used as the sole basis for treatment or other patient management decisions. A negative result may occur with  improper specimen collection/handling, submission of specimen other than nasopharyngeal swab, presence of viral mutation(s) within the areas targeted by this assay, and inadequate number of viral copies(<138 copies/mL). A negative result must be combined with clinical observations, patient history, and epidemiological information. The expected result is Negative.  Fact Sheet for Patients:  EntrepreneurPulse.com.au  Fact Sheet for Healthcare Providers:  IncredibleEmployment.be  This test is no                          t yet approved or cleared by the [place] FDA and  has been authorized for detection and/or diagnosis of SARS-CoV-2 by FDA under an Emergency Use Authorization (EUA). This EUA will remain  in  effect (meaning this test can be used) for the duration of the COVID-19 declaration under Section 564(b)(1) of the Act, 21 U.S.C.section 360bbb-3(b)(1), unless the authorization is terminated  or revoked sooner.      . Influenza A by PCR 02/15/2021 NEGATIVE  NEGATIVE Final  . Influenza B by PCR 02/15/2021 NEGATIVE  NEGATIVE Final   Comment: (NOTE) The Xpert Xpress SARS-CoV-2/FLU/RSV plus assay is intended as an aid in the diagnosis of influenza from Nasopharyngeal swab specimens and should not be used as a sole basis for treatment. Nasal washings and aspirates are unacceptable for Xpert Xpress SARS-CoV-2/FLU/RSV testing.  Fact Sheet for Patients: EntrepreneurPulse.com.au  Fact Sheet for Healthcare Providers: IncredibleEmployment.be  This test is not yet approved or cleared by the [place] FDA and has been authorized for detection and/or diagnosis of SARS-CoV-2 by FDA under an Emergency Use Authorization (EUA). This EUA will remain in effect (meaning this test can be used) for the duration of the COVID-19 declaration under Section 564(b)(1) of the Act, 21 U.S.C. section 360bbb-3(b)(1), unless the authorization is terminated or revoked.  Performed at [facility], [address], [place], [place] [address]   .  Prothrombin Time 02/15/2021 14.4  11.4 - 15.2 seconds Final  . INR 02/15/2021 1.1  0.8 - 1.2 Final   Comment: (NOTE) INR goal varies based on device and disease states. Performed at [facility], [address], [place], [place] [address]   . aPTT 02/15/2021 28  24 - 36 seconds Final   Performed at [facility], [address] [place], [place] [address]  . ABO/RH(D) 02/15/2021 A POS   Final  . Antibody Screen 02/15/2021 NEG   Final  . Sample Expiration 02/15/2021 02/18/2021,2359   Final  . Unit Number 02/15/2021 [id number]   Final  . Blood Component Type 02/15/2021 RED CELLS,LR   Final  . Unit  division 02/15/2021 00   Final  . Status of Unit 02/15/2021 ISSUED,FINAL   Final  . Transfusion Status 02/15/2021 OK TO TRANSFUSE   Final  . Crossmatch Result 02/15/2021    Final                   Value:Compatible Performed at [facility], [address] [place], [place] [address]   . WBC 02/15/2021 9.8  4.0 - 10.5 K/uL Final  . RBC 02/15/2021 2.23* 3.87 - 5.11 MIL/uL Final  . Hemoglobin 02/15/2021 6.0* 12.0 - 15.0 g/dL Final  . HCT 02/15/2021 19.8* 36.0 - 46.0 % Final  . MCV 02/15/2021 88.8  80.0 - 100.0 fL Final  . MCH 02/15/2021 26.9  26.0 - 34.0 pg Final  . MCHC 02/15/2021 30.3  30.0 - 36.0 g/dL Final  . RDW 02/15/2021 14.8  11.5 - 15.5 % Final  . Platelets 02/15/2021 342  150 - 400 K/uL Final  . nRBC 02/15/2021 0.0  0.0 - 0.2 % Final   Performed at [facility], [address], [place], [place] [address]  . WBC 02/15/2021 11.6* 4.0 - 10.5 K/uL Final  . RBC 02/15/2021 3.02* 3.87 - 5.11 MIL/uL Final  . Hemoglobin 02/15/2021 8.3* 12.0 - 15.0 g/dL Final   REPEATED TO VERIFY  . HCT 02/15/2021 26.3* 36.0 - 46.0 % Final  . MCV 02/15/2021 87.1  80.0 - 100.0 fL Final  . MCH 02/15/2021 27.5  26.0 - 34.0 pg Final  . MCHC 02/15/2021 31.6  30.0 - 36.0 g/dL Final  . RDW 02/15/2021 14.5  11.5 - 15.5 % Final  . Platelets 02/15/2021 361  150 - 400 K/uL Final  . nRBC 02/15/2021 0.0  0.0 - 0.2 % Final   Performed at [facility], [address], [place], [place] [address]  . WBC 02/16/2021 11.5* 4.0 - 10.5 K/uL Final  . RBC 02/16/2021 2.65* 3.87 - 5.11 MIL/uL Final  . Hemoglobin 02/16/2021 7.4* 12.0 - 15.0 g/dL Final  . HCT 02/16/2021 23.1* 36.0 - 46.0 % Final  . MCV 02/16/2021 87.2  80.0 - 100.0 fL Final  . MCH 02/16/2021 27.9  26.0 - 34.0 pg Final  . MCHC 02/16/2021 32.0  30.0 - 36.0 g/dL Final  . RDW 02/16/2021 14.6  11.5 - 15.5 % Final  . Platelets 02/16/2021 342  150 - 400 K/uL Final  . nRBC 02/16/2021 0.0  0.0 - 0.2 % Final   Performed at [facility], [address], [place], [place] [address]  . ISSUE DATE / TIME 02/15/2021 [id number]   Final  . Blood Product Unit Number 02/15/2021 [id number]   Final  . PRODUCT CODE 02/15/2021 [id number]   Final  . Unit Type and Rh 02/15/2021 6200   Final  .  Blood Product Expiration Date 02/15/2021 [id number]   Final  . Sodium 02/16/2021 134* 135 - 145 mmol/L Final  . Potassium 02/16/2021 4.7  3.5 - 5.1 mmol/L Final  . Chloride 02/16/2021 102  98 - 111 mmol/L Final  . CO2 02/16/2021 22  22 - 32 mmol/L Final  . Glucose, Bld 02/16/2021 115* 70 - 99 mg/dL Final   Glucose reference range applies only to samples taken after fasting for at least 8 hours.  . BUN 02/16/2021 64* 8 - 23 mg/dL Final  . Creatinine, Ser 02/16/2021 1.40* 0.44 - 1.00 mg/dL Final  . Calcium 02/16/2021 8.2* 8.9 - 10.3 mg/dL Final  . GFR, Estimated 02/16/2021 36* >60 mL/min Final   Comment: (NOTE) Calculated using the CKD-EPI Creatinine Equation (2021)   . Anion gap 02/16/2021 10  5 - 15 Final   Performed at [facility], [address] [place], [place] [address]  . WBC 02/16/2021 10.2  4.0 - 10.5 K/uL Final  . RBC 02/16/2021 2.83* 3.87 - 5.11 MIL/uL Final  . Hemoglobin 02/16/2021 7.8* 12.0 - 15.0 g/dL Final  . HCT 02/16/2021 24.3* 36.0 - 46.0 % Final  . MCV 02/16/2021 85.9  80.0 - 100.0 fL Final  . MCH 02/16/2021 27.6  26.0 - 34.0 pg Final  . MCHC 02/16/2021 32.1  30.0 - 36.0 g/dL Final  . RDW 02/16/2021 14.7  11.5 - 15.5 % Final  . Platelets 02/16/2021 342  150 - 400 K/uL Final  . nRBC 02/16/2021 0.0  0.0 - 0.2 % Final   Performed at [facility], [address] [place], [place] [address]  . Glucose-Capillary 02/15/2021 108* 70 - 99 mg/dL Final   Glucose reference range applies only to samples taken after fasting for at least 8 hours.  . Glucose-Capillary 02/16/2021 189* 70 - 99 mg/dL Final   Glucose reference range applies only to samples taken after fasting for at least 8 hours.  .  Comment 1 02/16/2021 Notify RN   Final  . Comment 2 02/16/2021 Document in Chart   Final  . Glucose-Capillary 02/16/2021 201* 70 - 99 mg/dL Final   Glucose reference range applies only to samples taken after fasting for at least 8 hours.  . Glucose-Capillary 02/16/2021 334* 70 - 99 mg/dL Final   Glucose reference range applies only to samples taken after fasting for at least 8 hours.  . Comment 1 02/16/2021 Notify RN   Final  . Comment 2 02/16/2021 Document in Chart   Final  . WBC 02/17/2021 12.5* 4.0 - 10.5 K/uL Final  . RBC 02/17/2021 2.88* 3.87 - 5.11 MIL/uL Final  . Hemoglobin 02/17/2021 7.9* 12.0 - 15.0 g/dL Final  . HCT 02/17/2021 25.3* 36.0 - 46.0 % Final  . MCV 02/17/2021 87.8  80.0 - 100.0 fL Final  . MCH 02/17/2021 27.4  26.0 - 34.0 pg Final  . MCHC 02/17/2021 31.2  30.0 - 36.0 g/dL Final  . RDW 02/17/2021 14.7  11.5 - 15.5 % Final  . Platelets 02/17/2021 344  150 - 400 K/uL Final  . nRBC 02/17/2021 0.0  0.0 - 0.2 % Final   Performed at [facility], [address] [place], [place] [address]  . Glucose-Capillary 02/16/2021 160* 70 - 99 mg/dL Final   Glucose reference range applies only to samples taken after fasting for at least 8 hours.  . Glucose-Capillary 02/17/2021 178* 70 - 99 mg/dL Final   Glucose reference range applies only to samples taken after fasting for at  least 8 hours.    Assessment:  [name] Day is a 87 y.o. female withiron deficiency anemia. She has been on oral iron x 1 year with continued decline in her counts. She has dysphagia and odynophagia. She is on PPI and carafate. Dietis modest. She denies pica.  Symptomatically, she feels poor and tired.  She takes all of her food through her PEG tube.  She has residual right-sided weakness from her recent stroke.  Plan: Iron deficiency anemia Recently hospitalized for symptomatic anemia and found to have a hemoglobin of 6.0.  She received 1 unit packed red blood cells and 1 iron infusion  (02/16/21).  Labs from 02/15/2021 show a iron saturation of 4% and a ferritin of 8.  Folate was normal. GI work-up was negative. Recommend 5 additional doses of IV Venofer. Patient can get first dose of IV Venofer today.  CMV esophagitis Status post G-tube placement Receives all of her primary nutrients through her G-tube.  Recent CVA Has right-sided residual weakness and aphasia. Spent several weeks at [facility] inpatient rehab. Strength has improved some. She lives at home alone but her children stay with her around-the-clock.  Disposition IV Venofer today. RTC for 4 additional doses of IV Venofer. Return to clinic 6 weeks after last dose of IV Venofer for lab work (CBC, CMP, ferritin, iron, B12, copper). Return to clinic in 12 weeks for labs (CBC, ferritin and iron), MD assessment and possible IV iron.  Greater than 50% was spent in counseling and coordination of care with this patient including but not limited to discussion of the relevant topics above (See A&P) including, but not limited to diagnosis and management of acute and chronic medical conditions.   Faythe Casa, NP 02/24/2021 2:14 PM

## 2021-02-24 NOTE — Patient Instructions (Signed)

## 2021-03-01 ENCOUNTER — Inpatient Hospital Stay: Payer: Medicare Other | Attending: Hematology and Oncology

## 2021-03-01 ENCOUNTER — Other Ambulatory Visit: Payer: Self-pay

## 2021-03-01 VITALS — BP 112/58 | HR 62 | Temp 96.9°F | Resp 18

## 2021-03-01 DIAGNOSIS — D509 Iron deficiency anemia, unspecified: Secondary | ICD-10-CM | POA: Diagnosis present

## 2021-03-01 MED ORDER — IRON SUCROSE 20 MG/ML IV SOLN
200.0000 mg | Freq: Once | INTRAVENOUS | Status: AC
  Administered 2021-03-01: 200 mg via INTRAVENOUS
  Filled 2021-03-01: qty 10

## 2021-03-01 MED ORDER — SODIUM CHLORIDE 0.9 % IV SOLN
INTRAVENOUS | Status: DC
  Filled 2021-03-01: qty 250

## 2021-03-03 ENCOUNTER — Inpatient Hospital Stay: Payer: Medicare Other

## 2021-03-03 VITALS — BP 113/54 | HR 64 | Temp 96.4°F | Resp 18

## 2021-03-03 DIAGNOSIS — D509 Iron deficiency anemia, unspecified: Secondary | ICD-10-CM

## 2021-03-03 MED ORDER — SODIUM CHLORIDE 0.9 % IV SOLN
Freq: Once | INTRAVENOUS | Status: AC
Start: 2021-03-03 — End: 2021-03-03
  Filled 2021-03-03: qty 250

## 2021-03-03 MED ORDER — IRON SUCROSE 20 MG/ML IV SOLN
200.0000 mg | Freq: Once | INTRAVENOUS | Status: AC
  Administered 2021-03-03: 200 mg via INTRAVENOUS
  Filled 2021-03-03: qty 10

## 2021-03-03 NOTE — Patient Instructions (Signed)
[facility] [place] [facility]    Discharge Instructions:  Thank you for choosing [facility] to provide your oncology and hematology care.  If you have a lab appointment with the Cancer Center, please go directly to the Cancer Center and check in at the registration area.  We strive to give you quality time with your provider. You may need to reschedule your appointment if you arrive late (15 or more minutes).  Arriving late affects you and other patients whose appointments are after yours.  Also, if you miss three or more appointments without notifying the office, you may be dismissed from the clinic at the provider's discretion.      For prescription refill requests, have your pharmacy contact our office and allow 72 hours for refills to be completed.    Today you received the following treatment: Venofer.  BELOW ARE SYMPTOMS THAT SHOULD BE REPORTED IMMEDIATELY: . *FEVER GREATER THAN 100.4 F (38 C) OR HIGHER . *CHILLS OR SWEATING . *NAUSEA AND VOMITING THAT IS NOT CONTROLLED WITH YOUR NAUSEA MEDICATION . *UNUSUAL SHORTNESS OF BREATH . *UNUSUAL BRUISING OR BLEEDING . *URINARY PROBLEMS (pain or burning when urinating, or frequent urination) . *BOWEL PROBLEMS (unusual diarrhea, constipation, pain near the anus) . TENDERNESS IN MOUTH AND THROAT WITH OR WITHOUT PRESENCE OF ULCERS (sore throat, sores in mouth, or a toothache) . UNUSUAL RASH, SWELLING OR PAIN  . UNUSUAL VAGINAL DISCHARGE OR ITCHING   Items with * indicate a potential emergency and should be followed up as soon as possible or go to the Emergency Department if any problems should occur.  Should you have questions after your visit or need to cancel or reschedule your appointment, please contact [facility]  [phone] and follow the prompts.  Office hours are 8:00 a.m. to 4:30 p.m. Monday - Friday. Please note that voicemails left after 4:00 p.m. may not be  returned until the following business day.  We are closed weekends and major holidays. You have access to a nurse at all times for urgent questions. Please call the main number to the clinic [phone] and follow the prompts.  For any non-urgent questions, you may also contact your provider using MyChart. We now offer e-Visits for anyone 18 and older to request care online for non-urgent symptoms. For details visit [email or web address].   Also download the MyChart app! Go to the app store, search "MyChart", open the app, select [facility], and log in with your MyChart username and password.  Due to Covid, a mask is required upon entering the hospital/clinic. If you do not have a mask, one will be given to you upon arrival. For doctor visits, patients may have 1 support person aged 18 or older with them. For treatment visits, patients cannot have anyone with them due to current Covid guidelines and our immunocompromised population.   Iron Sucrose injection  What is this medicine? IRON SUCROSE (AHY ern SOO krohs) is an iron complex. Iron is used to make healthy red blood cells, which carry oxygen and nutrients throughout the body. This medicine is used to treat iron deficiency anemia in people with chronic kidney disease. This medicine may be used for other purposes; ask your health care provider or pharmacist if you have questions. COMMON BRAND NAME(S): Venofer What should I tell my health care provider before I take this medicine? They need to know if you have any of these conditions:  anemia not caused by low iron levels    heart disease  high levels of iron in the blood  kidney disease  liver disease  an unusual or allergic reaction to iron, other medicines, foods, dyes, or preservatives  pregnant or trying to get pregnant  breast-feeding How should I use this medicine? This medicine is for infusion into a vein. It is given by a health care professional in a hospital or clinic  setting. Talk to your pediatrician regarding the use of this medicine in children. While this drug may be prescribed for children as young as 2 years for selected conditions, precautions do apply. Overdosage: If you think you have taken too much of this medicine contact a poison control center or emergency room at once. NOTE: This medicine is only for you. Do not share this medicine with others. What if I miss a dose? It is important not to miss your dose. Call your doctor or health care professional if you are unable to keep an appointment. What may interact with this medicine? Do not take this medicine with any of the following medications:  deferoxamine  dimercaprol  other iron products This medicine may also interact with the following medications:  chloramphenicol  deferasirox This list may not describe all possible interactions. Give your health care provider a list of all the medicines, herbs, non-prescription drugs, or dietary supplements you use. Also tell them if you smoke, drink alcohol, or use illegal drugs. Some items may interact with your medicine. What should I watch for while using this medicine? Visit your doctor or healthcare professional regularly. Tell your doctor or healthcare professional if your symptoms do not start to get better or if they get worse. You may need blood work done while you are taking this medicine. You may need to follow a special diet. Talk to your doctor. Foods that contain iron include: whole grains/cereals, dried fruits, beans, or peas, leafy green vegetables, and organ meats (liver, kidney). What side effects may I notice from receiving this medicine? Side effects that you should report to your doctor or health care professional as soon as possible:  allergic reactions like skin rash, itching or hives, swelling of the face, lips, or tongue  breathing problems  changes in blood pressure  cough  fast, irregular heartbeat  feeling faint  or lightheaded, falls  fever or chills  flushing, sweating, or hot feelings  joint or muscle aches/pains  seizures  swelling of the ankles or feet  unusually weak or tired Side effects that usually do not require medical attention (report to your doctor or health care professional if they continue or are bothersome):  diarrhea  feeling achy  headache  irritation at site where injected  nausea, vomiting  stomach upset  tiredness This list may not describe all possible side effects. Call your doctor for medical advice about side effects. You may report side effects to FDA at 1-800-FDA-1088. Where should I keep my medicine? This drug is given in a hospital or clinic and will not be stored at home. NOTE: This sheet is a summary. It may not cover all possible information. If you have questions about this medicine, talk to your doctor, pharmacist, or health care provider.  2021 Elsevier/Gold Standard (2011-06-28 17:14:35)  

## 2021-03-06 ENCOUNTER — Inpatient Hospital Stay: Payer: Medicare Other

## 2021-03-06 VITALS — BP 117/54 | HR 61 | Temp 96.4°F | Resp 18

## 2021-03-06 DIAGNOSIS — D509 Iron deficiency anemia, unspecified: Secondary | ICD-10-CM

## 2021-03-06 MED ORDER — IRON SUCROSE 20 MG/ML IV SOLN
200.0000 mg | Freq: Once | INTRAVENOUS | Status: AC
  Administered 2021-03-06: 200 mg via INTRAVENOUS
  Filled 2021-03-06: qty 10

## 2021-03-06 MED ORDER — SODIUM CHLORIDE 0.9 % IV SOLN
INTRAVENOUS | Status: DC
  Filled 2021-03-06: qty 250

## 2021-03-06 NOTE — Patient Instructions (Signed)
[facility]  Discharge Instructions: Thank you for choosing [facility] to provide your oncology and hematology care.  If you have a lab appointment with the [facility], please go directly to the [facility] and check in at the registration area.  Wear comfortable clothing and clothing appropriate for easy access to any Portacath or PICC line.   We strive to give you quality time with your provider. You may need to reschedule your appointment if you arrive late (15 or more minutes).  Arriving late affects you and other patients whose appointments are after yours.  Also, if you miss three or more appointments without notifying the office, you may be dismissed from the clinic at the provider's discretion.      For prescription refill requests, have your pharmacy contact our office and allow 72 hours for refills to be completed.    Today you received the following chemotherapy and/or immunotherapy agents VENOFER  To help prevent nausea and vomiting after your treatment, we encourage you to take your nausea medication as directed.  BELOW ARE SYMPTOMS THAT SHOULD BE REPORTED IMMEDIATELY: . *FEVER GREATER THAN 100.4 F (38 C) OR HIGHER . *CHILLS OR SWEATING . *NAUSEA AND VOMITING THAT IS NOT CONTROLLED WITH YOUR NAUSEA MEDICATION . *UNUSUAL SHORTNESS OF BREATH . *UNUSUAL BRUISING OR BLEEDING . *URINARY PROBLEMS (pain or burning when urinating, or frequent urination) . *BOWEL PROBLEMS (unusual diarrhea, constipation, pain near the anus) . TENDERNESS IN MOUTH AND THROAT WITH OR WITHOUT PRESENCE OF ULCERS (sore throat, sores in mouth, or a toothache) . UNUSUAL RASH, SWELLING OR PAIN  . UNUSUAL VAGINAL DISCHARGE OR ITCHING   Items with * indicate a potential emergency and should be followed up as soon as possible or go to the Emergency Department if any problems should occur.  Please show the CHEMOTHERAPY ALERT CARD or IMMUNOTHERAPY ALERT CARD  at check-in to the Emergency Department and triage nurse.  Should you have questions after your visit or need to cancel or reschedule your appointment, please contact [facility]  [phone] and follow the prompts.  Office hours are 8:00 a.m. to 4:30 p.m. Monday - Friday. Please note that voicemails left after 4:00 p.m. may not be returned until the following business day.  We are closed weekends and major holidays. You have access to a nurse at all times for urgent questions. Please call the main number to the clinic [phone] and follow the prompts.  For any non-urgent questions, you may also contact your provider using MyChart. We now offer e-Visits for anyone 57 and older to request care online for non-urgent symptoms. For details visit [email or web address].   Also download the MyChart app! Go to the app store, search "MyChart", open the app, select [facility], and log in with your MyChart username and password.  Due to Covid, a mask is required upon entering the hospital/clinic. If you do not have a mask, one will be given to you upon arrival. For doctor visits, patients may have 1 support person aged 55 or older with them. For treatment visits, patients cannot have anyone with them due to current Covid guidelines and our immunocompromised population.   Iron Sucrose injection What is this medicine? IRON SUCROSE (AHY ern SOO krohs) is an iron complex. Iron is used to make healthy red blood cells, which carry oxygen and nutrients throughout the body. This medicine is used to treat iron deficiency anemia in people with chronic kidney disease.  This medicine may be used for other purposes; ask your health care provider or pharmacist if you have questions. COMMON BRAND NAME(S): Venofer What should I tell my health care provider before I take this medicine? They need to know if you have any of these conditions:  anemia not caused by low iron  levels  heart disease  high levels of iron in the blood  kidney disease  liver disease  an unusual or allergic reaction to iron, other medicines, foods, dyes, or preservatives  pregnant or trying to get pregnant  breast-feeding How should I use this medicine? This medicine is for infusion into a vein. It is given by a health care professional in a hospital or clinic setting. Talk to your pediatrician regarding the use of this medicine in children. While this drug may be prescribed for children as young as 2 years for selected conditions, precautions do apply. Overdosage: If you think you have taken too much of this medicine contact a poison control center or emergency room at once. NOTE: This medicine is only for you. Do not share this medicine with others. What if I miss a dose? It is important not to miss your dose. Call your doctor or health care professional if you are unable to keep an appointment. What may interact with this medicine? Do not take this medicine with any of the following medications:  deferoxamine  dimercaprol  other iron products This medicine may also interact with the following medications:  chloramphenicol  deferasirox This list may not describe all possible interactions. Give your health care provider a list of all the medicines, herbs, non-prescription drugs, or dietary supplements you use. Also tell them if you smoke, drink alcohol, or use illegal drugs. Some items may interact with your medicine. What should I watch for while using this medicine? Visit your doctor or healthcare professional regularly. Tell your doctor or healthcare professional if your symptoms do not start to get better or if they get worse. You may need blood work done while you are taking this medicine. You may need to follow a special diet. Talk to your doctor. Foods that contain iron include: whole grains/cereals, dried fruits, beans, or peas, leafy green vegetables, and organ  meats (liver, kidney). What side effects may I notice from receiving this medicine? Side effects that you should report to your doctor or health care professional as soon as possible:  allergic reactions like skin rash, itching or hives, swelling of the face, lips, or tongue  breathing problems  changes in blood pressure  cough  fast, irregular heartbeat  feeling faint or lightheaded, falls  fever or chills  flushing, sweating, or hot feelings  joint or muscle aches/pains  seizures  swelling of the ankles or feet  unusually weak or tired Side effects that usually do not require medical attention (report to your doctor or health care professional if they continue or are bothersome):  diarrhea  feeling achy  headache  irritation at site where injected  nausea, vomiting  stomach upset  tiredness This list may not describe all possible side effects. Call your doctor for medical advice about side effects. You may report side effects to FDA at 1-800-FDA-1088. Where should I keep my medicine? This drug is given in a hospital or clinic and will not be stored at home. NOTE: This sheet is a summary. It may not cover all possible information. If you have questions about this medicine, talk to your doctor, pharmacist, or health care provider.  2021 Elsevier/Gold Standard (2011-06-28 17:14:35)

## 2021-03-07 ENCOUNTER — Ambulatory Visit: Payer: Medicare Other | Attending: Family | Admitting: Family

## 2021-03-07 ENCOUNTER — Encounter: Payer: Self-pay | Admitting: Pharmacist

## 2021-03-07 ENCOUNTER — Encounter: Payer: Self-pay | Admitting: Family

## 2021-03-07 ENCOUNTER — Other Ambulatory Visit: Payer: Self-pay

## 2021-03-07 VITALS — BP 116/56 | HR 59 | Resp 16 | Ht 64.0 in | Wt 112.0 lb

## 2021-03-07 DIAGNOSIS — E1122 Type 2 diabetes mellitus with diabetic chronic kidney disease: Secondary | ICD-10-CM | POA: Diagnosis not present

## 2021-03-07 DIAGNOSIS — D509 Iron deficiency anemia, unspecified: Secondary | ICD-10-CM

## 2021-03-07 DIAGNOSIS — K219 Gastro-esophageal reflux disease without esophagitis: Secondary | ICD-10-CM | POA: Insufficient documentation

## 2021-03-07 DIAGNOSIS — I509 Heart failure, unspecified: Secondary | ICD-10-CM | POA: Diagnosis not present

## 2021-03-07 DIAGNOSIS — E785 Hyperlipidemia, unspecified: Secondary | ICD-10-CM | POA: Insufficient documentation

## 2021-03-07 DIAGNOSIS — K222 Esophageal obstruction: Secondary | ICD-10-CM | POA: Diagnosis not present

## 2021-03-07 DIAGNOSIS — I130 Hypertensive heart and chronic kidney disease with heart failure and stage 1 through stage 4 chronic kidney disease, or unspecified chronic kidney disease: Secondary | ICD-10-CM | POA: Diagnosis present

## 2021-03-07 DIAGNOSIS — Z7982 Long term (current) use of aspirin: Secondary | ICD-10-CM | POA: Insufficient documentation

## 2021-03-07 DIAGNOSIS — Z7902 Long term (current) use of antithrombotics/antiplatelets: Secondary | ICD-10-CM | POA: Diagnosis not present

## 2021-03-07 DIAGNOSIS — Z8249 Family history of ischemic heart disease and other diseases of the circulatory system: Secondary | ICD-10-CM | POA: Diagnosis not present

## 2021-03-07 DIAGNOSIS — I10 Essential (primary) hypertension: Secondary | ICD-10-CM

## 2021-03-07 DIAGNOSIS — Z7984 Long term (current) use of oral hypoglycemic drugs: Secondary | ICD-10-CM | POA: Insufficient documentation

## 2021-03-07 DIAGNOSIS — N189 Chronic kidney disease, unspecified: Secondary | ICD-10-CM | POA: Diagnosis not present

## 2021-03-07 DIAGNOSIS — D649 Anemia, unspecified: Secondary | ICD-10-CM | POA: Diagnosis not present

## 2021-03-07 DIAGNOSIS — I5022 Chronic systolic (congestive) heart failure: Secondary | ICD-10-CM

## 2021-03-07 DIAGNOSIS — I272 Pulmonary hypertension, unspecified: Secondary | ICD-10-CM | POA: Diagnosis not present

## 2021-03-07 DIAGNOSIS — N1831 Chronic kidney disease, stage 3a: Secondary | ICD-10-CM

## 2021-03-07 DIAGNOSIS — Z931 Gastrostomy status: Secondary | ICD-10-CM | POA: Diagnosis not present

## 2021-03-07 NOTE — Progress Notes (Signed)
[facility] FAILURE CLINIC - PHARMACIST COUNSELING NOTE  Guideline-Directed Medical Therapy/Evidence Based Medicine  ACE/ARB/ARNI: Lisinopril 2.5 mg daily Beta Blocker: Carvedilol 3.125 mg twice daily Aldosterone Antagonist: Spironolactone 25 mg daily Diuretic: Furosemide 20 mg BID SGLT2i: N/A  Adherence Assessment  Do you ever forget to take your medication? [] Yes [x] No  Do you ever skip doses due to side effects? [] Yes [x] No  Do you have trouble affording your medicines? [] Yes [x] No  Are you ever unable to pick up your medication due to transportation difficulties? [] Yes [x] No  Do you ever stop taking your medications because you don't believe they are helping? [] Yes [x] No  Do you check your weight daily? [x] Yes [] No   Adherence strategy: Pt daughter handles her medications  Barriers to obtaining medications: N/A  Vital signs: HR 59, BP 116/56, weight (pounds) 112 ECHO: Date 12/19/2020, EF 35-40%, notes mild LVH  BMP Latest Ref Rng & Units 02/16/2021 02/15/2021 12/26/2020  Glucose 70 - 99 mg/dL 115(H) 256(H) 202(H)  BUN 8 - 23 mg/dL 64(H) 72(H) 25(H)  Creatinine 0.44 - 1.00 mg/dL 1.40(H) 1.50(H) 0.86  BUN/Creat Ratio 6 - 22 (calc) - - -  Sodium 135 - 145 mmol/L 134(L) 132(L) 138  Potassium 3.5 - 5.1 mmol/L 4.7 6.1(H) 4.6  Chloride 98 - 111 mmol/L 102 99 108  CO2 22 - 32 mmol/L 22 22 23   Calcium 8.9 - 10.3 mg/dL 8.2(L) 8.5(L) 8.6(L)    Past Medical History:  Diagnosis Date  . Anemia    IRON INFUSIONS  . Aortic valve sclerosis   . Arrhythmia   . Arthritis    osteoarthritis  . Basal cell carcinoma of skin   . Brain tumor ([place])   . Brain tumor ([facility])   . Cervical spine disease   . CHF (congestive heart failure) ([place])   . Diabetes mellitus without complication ([facility])   . Dysrhythmia    sinus arrhythmia  . Esophageal stricture    severe, led to feeding tube placement in Sept 2019  . Esophageal ulcer without bleeding   . Gastrostomy  tube dependent ([place])    DOES NOT EAT OR DRINK   . GERD (gastroesophageal reflux disease)   . History of kidney stones   . Hyperlipemia   . Hypertension   . Kidney stones   . Leaky heart valve   . Meningioma ([place])   . 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 Pulmonary hypertension ([place])   . RLS (restless legs syndrome)   . Vertigo     ASSESSMENT 87 year old female who presents to the HF clinic for a follow up visit. Pt denies any symptoms/episodes of hypotension. Pt receives feedings/meds via G-tube. Pt feels tired/fatigued but has started receiving iron infusions for anemia.   Recent ED Visit (past 6 months): Date - 02/15/2021, [facility] - SOB/anemia  PLAN CHF/HTN -continue lisinopril 2.5 mg daily, carvedilol 3.125 mg twice daily, spironolactone 25 mg daily, and furosemide 20 mg twice daily -Blood pressure limiting titration of GDMT -continue weight checks   Hx of stroke -continue DAPT with aspirin 81 mg daily and clopidogrel 75 mg daily -unable to add statin as pt has reported intolerance (muscle aches/pains) to multiple statins  DM -continue metformin 500 mg twice daily and glipizide 5 mg QAM and 2.5 mg QPM  GERD -continue pantoprazole 40 mg daily   Restless leg syndrome -continue ropinirole 2 mg twice daily   Time spent: 10 minutes  Sherilyn Banker, PharmD Pharmacy Resident  03/07/2021  11:29 AM    Current Outpatient Medications:  .  acetaminophen (TYLENOL) 160 MG/5ML solution, Place 20.3 mLs (650 mg total) into feeding tube every 6 (six) hours as needed for mild pain., Disp: 120 mL, Rfl: 0 .  aspirin 81 MG chewable tablet, Place 1 tablet (81 mg total) into feeding tube daily., Disp: 30 tablet, Rfl: 0 .  carvedilol (COREG) 3.125 MG tablet, Place 1 tablet (3.125 mg total) into feeding tube 2 (two) times daily with a meal., Disp: 30 tablet, Rfl: 3 .  clopidogrel (PLAVIX) 75 MG tablet, GIVE 1 TABLET VIA FEEDING TUBE EVERY DAY AS DIRECTED, Disp: 30 tablet, Rfl: 2 .   furosemide (LASIX) 20 MG tablet, Place 0.5 tablets (10 mg total) into feeding tube daily. (Patient taking differently: Place 20 mg into feeding tube 2 (two) times daily.), Disp: 30 tablet, Rfl: 0 .  glipiZIDE (GLUCOTROL XL) 2.5 MG 24 hr tablet, Take 2.5 mg by mouth at bedtime., Disp: , Rfl:  .  glipiZIDE (GLUCOTROL) 5 MG tablet, Take 5 mg by mouth in the morning., Disp: , Rfl:  .  hydrocerin (EUCERIN) CREA, Apply 1 application topically 2 (two) times daily. To bilateral feet, Disp: , Rfl: 0 .  Hypromellose 0.2 % SOLN, Place 1 drop into both eyes 3 (three) times daily as needed (dry eyes).  (Patient not taking: Reported on 03/07/2021), Disp: , Rfl:  .  lisinopril (ZESTRIL) 2.5 MG tablet, Take 1 tablet (2.5 mg total) by mouth daily., Disp: 30 tablet, Rfl: 0 .  metFORMIN (GLUCOPHAGE) 500 MG tablet, Place 1 tablet (500 mg total) into feeding tube 2 (two) times daily with a meal., Disp: 30 tablet, Rfl: 1 .  Multiple Vitamin (MULTIVITAMIN) LIQD, Place 15 mLs into feeding tube daily., Disp: 450 mL, Rfl: 1 .  Nutritional Supplements (FEEDING SUPPLEMENT, KATE FARMS STANDARD 1.4,) LIQD liquid, Place 325 mLs into feeding tube 4 (four) times daily. (Patient taking differently: Place 325 mLs into feeding tube 3 (three) times daily between meals.), Disp: , Rfl:  .  pantoprazole sodium (PROTONIX) 40 mg/20 mL PACK, Place 20 mLs (40 mg total) into feeding tube daily., Disp: 600 mL, Rfl: 0 .  polyethylene glycol (MIRALAX / GLYCOLAX) 17 g packet, Place 17 g into feeding tube daily as needed. (Patient not taking: Reported on 03/07/2021), Disp: 14 each, Rfl: 0 .  polyvinyl alcohol (LIQUIFILM TEARS) 1.4 % ophthalmic solution, Place 1 drop into both eyes as needed for dry eyes., Disp: 15 mL, Rfl: 0 .  rOPINIRole (REQUIP) 2 MG tablet, Place 1 tablet (2 mg total) into feeding tube 2 (two) times daily., Disp: 60 tablet, Rfl: 0 .  spironolactone (ALDACTONE) 25 MG tablet, Take 25 mg by mouth in the morning., Disp: , Rfl:  .  Water  For Irrigation, Sterile (FREE WATER) SOLN, Place 100 mLs into feeding tube every 6 (six) hours., Disp: , Rfl:    DRUGS TO CAUTION IN HEART FAILURE  Drug or Class Mechanism  Analgesics . NSAIDs . COX-2 inhibitors . Glucocorticoids  Sodium and water retention, increased systemic vascular resistance, decreased response to diuretics   Diabetes Medications . Metformin . Thiazolidinediones o Rosiglitazone (Avandia) o Pioglitazone (Actos) . DPP4 Inhibitors o Saxagliptin (Onglyza) o Sitagliptin (Januvia)   Lactic acidosis Possible calcium channel blockade   Unknown  Antiarrhythmics . Class I  o Flecainide o Disopyramide . Class III o Sotalol . Other o Dronedarone  Negative inotrope, proarrhythmic   Proarrhythmic, beta blockade  Negative inotrope  Antihypertensives . Alpha Blockers o  Doxazosin . Calcium Channel Blockers o Diltiazem o Verapamil o Nifedipine . Central Alpha Adrenergics o Moxonidine . Peripheral Vasodilators o Minoxidil  Increases renin and aldosterone  Negative inotrope    Possible sympathetic withdrawal  Unknown  Anti-infective . Itraconazole . Amphotericin B  Negative inotrope Unknown  Hematologic . Anagrelide . Cilostazol   Possible inhibition of PD IV Inhibition of PD III causing arrhythmias  Neurologic/Psychiatric . Stimulants . Anti-Seizure Drugs o Carbamazepine o Pregabalin . Antidepressants o Tricyclics o Citalopram . Parkinsons o Bromocriptine o Pergolide o Pramipexole . Antipsychotics o Clozapine . Antimigraine o Ergotamine o Methysergide . Appetite suppressants . Bipolar o Lithium  Peripheral alpha and beta agonist activity  Negative inotrope and chronotrope Calcium channel blockade  Negative inotrope, proarrhythmic Dose-dependent QT prolongation  Excessive serotonin activity/valvular damage Excessive serotonin activity/valvular damage Unknown  IgE mediated hypersensitivy, calcium channel  blockade  Excessive serotonin activity/valvular damage Excessive serotonin activity/valvular damage Valvular damage  Direct myofibrillar degeneration, adrenergic stimulation  Antimalarials . Chloroquine . Hydroxychloroquine Intracellular inhibition of lysosomal enzymes  Urologic Agents . Alpha Blockers o Doxazosin o Prazosin o Tamsulosin o Terazosin  Increased renin and aldosterone  Adapted from Page RL, et al. "Drugs That May Cause or Exacerbate Heart Failure: A Scientific Statement from the [place]." Circulation 2016; 378:H88-F02. DOI: 10.1161/CIR.0000000000000426   MEDICATION ADHERENCES TIPS AND STRATEGIES 1. Taking medication as prescribed improves patient outcomes in heart failure (reduces hospitalizations, improves symptoms, increases survival) 2. Side effects of medications can be managed by decreasing doses, switching agents, stopping drugs, or adding additional therapy. Please let someone in the [facility] know if you have having bothersome side effects so we can modify your regimen. Do not alter your medication regimen without talking to [place].  3. Medication reminders can help patients remember to take drugs on time. If you are missing or forgetting doses you can try linking behaviors, using pill boxes, or an electronic reminder like an alarm on your phone or an app. Some people can also get automated phone calls as medication reminders.

## 2021-03-07 NOTE — Patient Instructions (Addendum)
Continue weighing daily and call for an overnight weight gain of > 2 pounds or a weekly weight gain of >5 pounds.   Call us in the future if you need us 

## 2021-03-07 NOTE — Progress Notes (Signed)
Patient ID: [name], female    DOB: 04/22/1929, 87 y.o.   MRN: [id number]  HPI  [name] is a 87 y/o female with a history of DM, hyperlipidemia, HTN, CKD, brain tumor, GERD, pulmonary HTN, anemia, G-tube and chronic heart failure.   Echo report from 12/18/20 reviewed and showed an EF of 35-40% along with mild LVH, moderate LAE and moderate/severe MR. Echo report from 11/24/19 reviewed and showed an EF of 20-25% along with severe MR, mod/severe TR, mild AR, moderately elevated PA pressure and large pleural effusion on the left.   Admitted 02/15/21 due to shortness of breath from HF and anemia. Cardiology, GI and palliative care consults obtained. ASA and plavix held and 1 unit of PRBC's given. Has gotten intermittent IV infusions. Given 1 dose of IV lasix with blood transfusion. Discharged after 2 days.   She presents today for a follow-up visit with a chief complaint of moderate fatigue upon minimal exertion. She describes this as chronic in nature having been present for several years. She has associated cough, shortness of breath, light-headedness, chronic diarrhea and easy bruising along with this.   She says that she can only tolerate 3 tube feedings/ [name] as if the does 4, she reports feeling bloated and uncomfortable in her abdomen. Takes very little liquids by mouth mostly just enough to wet her dry mouth.   Past Medical History:  Diagnosis Date  . Anemia    IRON INFUSIONS  . Aortic valve sclerosis   . Arrhythmia   . Arthritis    osteoarthritis  . Basal cell carcinoma of skin   . Brain tumor ([place])   . Brain tumor ([place])   . Cervical spine disease   . CHF (congestive heart failure) ([place])   . Diabetes mellitus without complication ([place])   . Dysrhythmia    sinus arrhythmia  . Esophageal stricture    severe, led to feeding tube placement in Sept 2019  . Esophageal ulcer without bleeding   . Gastrostomy tube dependent ([place])    DOES NOT EAT OR DRINK   . GERD  (gastroesophageal reflux disease)   . History of kidney stones   . Hyperlipemia   . Hypertension   . Kidney stones   . Leaky heart valve   . Meningioma ([place])   . 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 Pulmonary hypertension ([place])   . RLS (restless legs syndrome)   . Vertigo    Past Surgical History:  Procedure Laterality Date  . ABDOMINAL HYSTERECTOMY    . APPENDECTOMY    . ARTHROGRAM KNEE Left   . BACK SURGERY     CERVIVAL NECK FUSION  . CATARACT EXTRACTION W/PHACO Left 11/11/2018   Procedure: CATARACT EXTRACTION PHACO AND INTRAOCULAR LENS PLACEMENT (IOC) LEFT, DIABETIC;  Surgeon: Birder Robson, MD;  Location: [facility] ORS;  Service: Ophthalmology;  Laterality: Left;  [place]  00:41 CDE 6.41 Fluid pack lot # [id number]  . COLONOSCOPY WITH PROPOFOL N/A 06/20/2017   Procedure: COLONOSCOPY WITH PROPOFOL;  Surgeon: Lollie Sails, MD;  Location: [facility] ENDOSCOPY;  Service: Endoscopy;  Laterality: N/A;  . ESOPHAGOGASTRODUODENOSCOPY (EGD) WITH PROPOFOL N/A 03/14/2015   Procedure: ESOPHAGOGASTRODUODENOSCOPY (EGD) WITH PROPOFOL;  Surgeon: Josefine Class, MD;  Location: [facility] ENDOSCOPY;  Service: Endoscopy;  Laterality: N/A;  . ESOPHAGOGASTRODUODENOSCOPY (EGD) WITH PROPOFOL N/A 03/28/2017   Procedure: ESOPHAGOGASTRODUODENOSCOPY (EGD) WITH PROPOFOL;  Surgeon: Lollie Sails, MD;  Location: [facility] ENDOSCOPY;  Service: Endoscopy;  Laterality: N/A;  . ESOPHAGOGASTRODUODENOSCOPY (EGD)  WITH PROPOFOL N/A 06/20/2017   Procedure: ESOPHAGOGASTRODUODENOSCOPY (EGD) WITH PROPOFOL;  Surgeon: Lollie Sails, MD;  Location: [facility] ENDOSCOPY;  Service: Endoscopy;  Laterality: N/A;  . ESOPHAGOGASTRODUODENOSCOPY (EGD) WITH PROPOFOL N/A 10/21/2017   Procedure: ESOPHAGOGASTRODUODENOSCOPY (EGD) WITH PROPOFOL;  Surgeon: Lollie Sails, MD;  Location: [facility] ENDOSCOPY;  Service: Endoscopy;  Laterality: N/A;  . ESOPHAGOGASTRODUODENOSCOPY (EGD) WITH PROPOFOL N/A 04/15/2018   Procedure: ESOPHAGOGASTRODUODENOSCOPY  (EGD) WITH PROPOFOL;  Surgeon: Lollie Sails, MD;  Location: [facility] ENDOSCOPY;  Service: Endoscopy;  Laterality: N/A;  . ESOPHAGUS SURGERY     CLOSURE  . EYE SURGERY    . HYSTERECTOMY ABDOMINAL WITH SALPINGECTOMY    . IR GASTROSTOMY TUBE MOD SED  06/11/2018  . IR GASTROSTOMY TUBE REMOVAL  03/25/2019  . IR REPLACE G-TUBE SIMPLE WO FLUORO  10/19/2020  . PERIPHERAL VASCULAR CATHETERIZATION N/A 01/23/2016   Procedure: Visceral Venography;  Surgeon: Algernon Huxley, MD;  Location: [place] CV LAB;  Service: Cardiovascular;  Laterality: N/A;  . PERIPHERAL VASCULAR CATHETERIZATION  01/23/2016   Procedure: Peripheral Vascular Intervention;  Surgeon: Algernon Huxley, MD;  Location: [place] CV LAB;  Service: Cardiovascular;;  . UPPER ESOPHAGEAL ENDOSCOPIC ULTRASOUND (EUS) N/A 12/05/2017   Procedure: UPPER ESOPHAGEAL ENDOSCOPIC ULTRASOUND (EUS);  Surgeon: Jola Schmidt, MD;  Location: [facility] ENDOSCOPY;  Service: Endoscopy;  Laterality: N/A;  . VISCERAL ANGIOGRAPHY N/A 03/18/2017   Procedure: Visceral Angiography;  Surgeon: Algernon Huxley, MD;  Location: [place] CV LAB;  Service: Cardiovascular;  Laterality: N/A;  . VISCERAL ARTERY INTERVENTION N/A 03/18/2017   Procedure: Visceral Artery Intervention;  Surgeon: Algernon Huxley, MD;  Location: [place] CV LAB;  Service: Cardiovascular;  Laterality: N/A;   Family History  Problem Relation Age of Onset  . Stroke Mother   . Hypertension Mother   . Heart disease Mother   . Cancer Father   . Heart disease Father   . Heart attack Sister   . Diabetes Sister   . Heart attack Brother    Social History   Tobacco Use  . Smoking status: Never Smoker  . Smokeless tobacco: Never Used  Substance Use Topics  . Alcohol use: Never   Allergies  Allergen Reactions  . Elemental Sulfur Diarrhea and Nausea And Vomiting  . Gabapentin Swelling  . Lipitor [Atorvastatin] Other (See Comments)    Muscle aches  . Mevacor [Lovastatin] Other (See Comments)    Muscle  aches  . Milk-Related Compounds Other (See Comments)    Large quantities cause headaches   . Septra [Sulfamethoxazole-Trimethoprim] Other (See Comments)    Unknown  . Statins Other (See Comments)    Muscle pain  . Zocor [Simvastatin] Other (See Comments)    Muscle aches   Prior to Admission medications   Medication Sig Start Date End Date Taking? Authorizing Provider  acetaminophen (TYLENOL) 160 MG/5ML solution Place 20.3 mLs (650 mg total) into feeding tube every 6 (six) hours as needed for mild pain. 06/13/18  Yes Demetrios Loll, MD  aspirin 81 MG chewable tablet Place 1 tablet (81 mg total) into feeding tube daily. 12/22/20  Yes Regalado, Belkys A, MD  carvedilol (COREG) 3.125 MG tablet Place 1 tablet (3.125 mg total) into feeding tube 2 (two) times daily with a meal. 12/22/20  Yes Regalado, Belkys A, MD  clopidogrel (PLAVIX) 75 MG tablet GIVE 1 TABLET VIA FEEDING TUBE EVERY [name] AS DIRECTED 12/30/20  Yes Love, Ivan Anchors, PA-C  furosemide (LASIX) 20 MG tablet Place 0.5 tablets (10 mg total)  into feeding tube daily. Patient taking differently: Place 20 mg into feeding tube 2 (two) times daily. 12/30/20  Yes Love, Ivan Anchors, PA-C  glipiZIDE (GLUCOTROL XL) 2.5 MG 24 hr tablet Take 2.5 mg by mouth at bedtime. 01/20/21 07/19/21 Yes [provider]  glipiZIDE (GLUCOTROL) 5 MG tablet Take 5 mg by mouth in the morning. 01/20/21 07/19/21 Yes [provider]  hydrocerin (EUCERIN) CREA Apply 1 application topically 2 (two) times daily. To bilateral feet 12/27/20  Yes Love, Ivan Anchors, PA-C  lisinopril (ZESTRIL) 2.5 MG tablet Take 1 tablet (2.5 mg total) by mouth daily. 12/31/20  Yes Love, Ivan Anchors, PA-C  metFORMIN (GLUCOPHAGE) 500 MG tablet Place 1 tablet (500 mg total) into feeding tube 2 (two) times daily with a meal. 02/17/21  Yes Fritzi Mandes, MD  Multiple Vitamin (MULTIVITAMIN) LIQD Place 15 mLs into feeding tube daily. 06/13/18  Yes Demetrios Loll, MD  Nutritional Supplements (FEEDING SUPPLEMENT, KATE  FARMS STANDARD 1.4,) LIQD liquid Place 325 mLs into feeding tube 4 (four) times daily. Patient taking differently: Place 325 mLs into feeding tube 3 (three) times daily between meals. 12/30/20  Yes Love, Ivan Anchors, PA-C  pantoprazole sodium (PROTONIX) 40 mg/20 mL PACK Place 20 mLs (40 mg total) into feeding tube daily. 12/30/20  Yes Love, Ivan Anchors, PA-C  polyvinyl alcohol (LIQUIFILM TEARS) 1.4 % ophthalmic solution Place 1 drop into both eyes as needed for dry eyes. 12/30/20  Yes Love, Ivan Anchors, PA-C  rOPINIRole (REQUIP) 2 MG tablet Place 1 tablet (2 mg total) into feeding tube 2 (two) times daily. 12/30/20  Yes Love, Ivan Anchors, PA-C  spironolactone (ALDACTONE) 25 MG tablet Take 25 mg by mouth in the morning. 02/01/21  Yes [provider]  Water For Irrigation, Sterile (FREE WATER) SOLN Place 100 mLs into feeding tube every 6 (six) hours. 12/30/20  Yes Love, Ivan Anchors, PA-C  Hypromellose 0.2 % SOLN Place 1 drop into both eyes 3 (three) times daily as needed (dry eyes).  Patient not taking: Reported on 03/07/2021    [provider]  polyethylene glycol (MIRALAX / GLYCOLAX) 17 g packet Place 17 g into feeding tube daily as needed. Patient not taking: Reported on 03/07/2021 12/30/20   Bary Leriche, PA-C   Review of Systems  Constitutional: Positive for fatigue (easily). Negative for appetite change.  HENT: Positive for congestion. Negative for postnasal drip and sore throat.   Eyes: Negative.   Respiratory: Positive for cough and shortness of breath. Negative for chest tightness.   Cardiovascular: Negative for chest pain, palpitations and leg swelling.  Gastrointestinal: Positive for diarrhea (after tube feeds). Negative for abdominal distention and abdominal pain.  Endocrine: Negative.   Genitourinary: Negative.   Musculoskeletal: Negative for back pain and neck pain.  Skin: Negative.   Allergic/Immunologic: Negative.   Neurological: Positive for light-headedness and numbness (in feet/toes).  Negative for dizziness.  Hematological: Negative for adenopathy. Bruises/bleeds easily.  Psychiatric/Behavioral: Negative for dysphoric mood and sleep disturbance (sleeping on 3 pillows). The patient is not nervous/anxious.    Vitals:   03/07/21 1134  BP: (!) 116/56  Pulse: (!) 59  Resp: 16  SpO2: 100%  Weight: 112 lb (50.8 kg)  Height: 5\' 4"  (1.626 m)   Wt Readings from Last 3 Encounters:  03/07/21 112 lb (50.8 kg)  02/24/21 115 lb 8 oz (52.4 kg)  02/17/21 123 lb 0.3 oz (55.8 kg)   Lab Results  Component Value Date   CREATININE 1.40 (H) 02/16/2021  CREATININE 1.50 (H) 02/15/2021   CREATININE 0.86 12/26/2020   Physical Exam Vitals and nursing note reviewed. Exam conducted with a chaperone present (daughter).  Constitutional:      Appearance: She is well-developed.  HENT:     Head: Normocephalic and atraumatic.  Neck:     Vascular: No JVD.  Cardiovascular:     Rate and Rhythm: Regular rhythm. Bradycardia present.  Pulmonary:     Effort: Pulmonary effort is normal. No respiratory distress.     Breath sounds: No wheezing or rales.  Abdominal:     Palpations: Abdomen is soft.     Tenderness: There is no abdominal tenderness.  Musculoskeletal:     Cervical back: Normal range of motion and neck supple.     Right lower leg: No tenderness. No edema.     Left lower leg: No tenderness. No edema.  Skin:    General: Skin is warm and dry.  Neurological:     General: No focal deficit present.     Mental Status: She is alert and oriented to person, place, and time.  Psychiatric:        Mood and Affect: Mood normal.        Behavior: Behavior normal.    Assessment & Plan:  1: Chronic heart failure with reduced ejection fraction- - NYHA class III - euvolemic today - weighing daily; reminded to call for an overnight weight gain of >2 pounds or a weekly weight gain of >5 pounds - weight down 6 pounds from last visit here 8 months ago - saw cardiology (Fath) 02/20/21 - on  GDMT of carvedilol, lisinopril and spironolactone - bradycardic so unable to titrate carvedilol - BP on the low side so unable to change lisinopril to entresto - BNP 02/15/21 was 1515.3 - has home health nurse from [place] coming - PharmD reconciled medications with the patient  2: HTN- - BP looks good although on the low side (116/56) - saw PCP (Hande) 02/21/21 - CMP 02/20/21 reviewed and showed sodium 135, potassium 5.6, creatinine 1.3 and GFR 39  3: DM- - A1c 12/18/20 was 6.9% - glucose at home today was 183 - saw endocrinology Pasty Arch) 06/09/20  4: Anemia- - had iron infusion 03/06/21 - saw hematology (Burns) 02/24/21 - hemoglobin 02/17/21 was 7.9  5: Esophageal stenosis- - saw GI Jacqulyn Liner) 02/20/21 - gets all feeding through G-tube; can only tolerate 3 tube feedings/ [name] due to abdominal bloating/distention   Patient did not bring her medications nor a list. Each medication was verbally reviewed with the patient and she was encouraged to bring the bottles to every visit to confirm accuracy of list.  Due to HF stability, will not make a return appointment at this time. Advised patient and her daughter that they could call back at anytime for questions or to make another appointment and they were comfortable with this plan.

## 2021-03-08 ENCOUNTER — Inpatient Hospital Stay: Payer: Medicare Other

## 2021-03-08 VITALS — BP 114/49 | HR 60 | Temp 96.0°F | Resp 18

## 2021-03-08 DIAGNOSIS — D509 Iron deficiency anemia, unspecified: Secondary | ICD-10-CM

## 2021-03-08 MED ORDER — SODIUM CHLORIDE 0.9 % IV SOLN
Freq: Once | INTRAVENOUS | Status: AC
  Filled 2021-03-08: qty 250

## 2021-03-08 MED ORDER — IRON SUCROSE 20 MG/ML IV SOLN
200.0000 mg | Freq: Once | INTRAVENOUS | Status: AC
  Administered 2021-03-08: 200 mg via INTRAVENOUS
  Filled 2021-03-08: qty 10

## 2021-03-08 NOTE — Progress Notes (Signed)
Pt received IV venofer in clinic today. Tolerated well. VSS @ d/c. 

## 2021-03-08 NOTE — Patient Instructions (Signed)
[facility]    Discharge Instructions: Thank you for choosing [facility] to provide your oncology and hematology care.  If you have a lab appointment with the [facility], please go directly to the [facility] and check in at the registration area.  Wear comfortable clothing and clothing appropriate for easy access to any Portacath or PICC line.   We strive to give you quality time with your provider. You may need to reschedule your appointment if you arrive late (15 or more minutes).  Arriving late affects you and other patients whose appointments are after yours.  Also, if you miss three or more appointments without notifying the office, you may be dismissed from the clinic at the provider's discretion.      For prescription refill requests, have your pharmacy contact our office and allow 72 hours for refills to be completed.     BELOW ARE SYMPTOMS THAT SHOULD BE REPORTED IMMEDIATELY: . *FEVER GREATER THAN 100.4 F (38 C) OR HIGHER . *CHILLS OR SWEATING . *NAUSEA AND VOMITING THAT IS NOT CONTROLLED WITH YOUR NAUSEA MEDICATION . *UNUSUAL SHORTNESS OF BREATH . *UNUSUAL BRUISING OR BLEEDING . *URINARY PROBLEMS (pain or burning when urinating, or frequent urination) . *BOWEL PROBLEMS (unusual diarrhea, constipation, pain near the anus) . TENDERNESS IN MOUTH AND THROAT WITH OR WITHOUT PRESENCE OF ULCERS (sore throat, sores in mouth, or a toothache) . UNUSUAL RASH, SWELLING OR PAIN  . UNUSUAL VAGINAL DISCHARGE OR ITCHING   Items with * indicate a potential emergency and should be followed up as soon as possible or go to the Emergency Department if any problems should occur.  Please show the CHEMOTHERAPY ALERT CARD or IMMUNOTHERAPY ALERT CARD at check-in to the Emergency Department and triage nurse.  Should you have questions after your visit or need to cancel or reschedule your appointment, please contact [facility]  [phone] and follow the prompts.  Office hours are 8:00 a.m. to 4:30 p.m. Monday - Friday. Please note that voicemails left after 4:00 p.m. may not be returned until the following business day.  We are closed weekends and major holidays. You have access to a nurse at all times for urgent questions. Please call the main number to the clinic [phone] and follow the prompts.  For any non-urgent questions, you may also contact your provider using MyChart. We now offer e-Visits for anyone 25 and older to request care online for non-urgent symptoms. For details visit [email or web address].   Also download the MyChart app! Go to the app store, search "MyChart", open the app, select [facility], and log in with your MyChart username and password.  Due to Covid, a mask is required upon entering the hospital/clinic. If you do not have a mask, one will be given to you upon arrival. For doctor visits, patients may have 1 support person aged 60 or older with them. For treatment visits, patients cannot have anyone with them due to current Covid guidelines and our immunocompromised population.   Iron Sucrose injection What is this medicine? IRON SUCROSE (AHY ern SOO krohs) is an iron complex. Iron is used to make healthy red blood cells, which carry oxygen and nutrients throughout the body. This medicine is used to treat iron deficiency anemia in people with chronic kidney disease. This medicine may be used for other purposes; ask your health care provider or pharmacist if you have questions. COMMON BRAND NAME(S): Venofer What should I tell my  health care provider before I take this medicine? They need to know if you have any of these conditions:  anemia not caused by low iron levels  heart disease  high levels of iron in the blood  kidney disease  liver disease  an unusual or allergic reaction to iron, other medicines, foods, dyes, or preservatives  pregnant or trying to get  pregnant  breast-feeding How should I use this medicine? This medicine is for infusion into a vein. It is given by a health care professional in a hospital or clinic setting. Talk to your pediatrician regarding the use of this medicine in children. While this drug may be prescribed for children as young as 2 years for selected conditions, precautions do apply. Overdosage: If you think you have taken too much of this medicine contact a poison control center or emergency room at once. NOTE: This medicine is only for you. Do not share this medicine with others. What if I miss a dose? It is important not to miss your dose. Call your doctor or health care professional if you are unable to keep an appointment. What may interact with this medicine? Do not take this medicine with any of the following medications:  deferoxamine  dimercaprol  other iron products This medicine may also interact with the following medications:  chloramphenicol  deferasirox This list may not describe all possible interactions. Give your health care provider a list of all the medicines, herbs, non-prescription drugs, or dietary supplements you use. Also tell them if you smoke, drink alcohol, or use illegal drugs. Some items may interact with your medicine. What should I watch for while using this medicine? Visit your doctor or healthcare professional regularly. Tell your doctor or healthcare professional if your symptoms do not start to get better or if they get worse. You may need blood work done while you are taking this medicine. You may need to follow a special diet. Talk to your doctor. Foods that contain iron include: whole grains/cereals, dried fruits, beans, or peas, leafy green vegetables, and organ meats (liver, kidney). What side effects may I notice from receiving this medicine? Side effects that you should report to your doctor or health care professional as soon as possible:  allergic reactions like  skin rash, itching or hives, swelling of the face, lips, or tongue  breathing problems  changes in blood pressure  cough  fast, irregular heartbeat  feeling faint or lightheaded, falls  fever or chills  flushing, sweating, or hot feelings  joint or muscle aches/pains  seizures  swelling of the ankles or feet  unusually weak or tired Side effects that usually do not require medical attention (report to your doctor or health care professional if they continue or are bothersome):  diarrhea  feeling achy  headache  irritation at site where injected  nausea, vomiting  stomach upset  tiredness This list may not describe all possible side effects. Call your doctor for medical advice about side effects. You may report side effects to FDA at 1-800-FDA-1088. Where should I keep my medicine? This drug is given in a hospital or clinic and will not be stored at home. NOTE: This sheet is a summary. It may not cover all possible information. If you have questions about this medicine, talk to your doctor, pharmacist, or health care provider.  2021 Elsevier/Gold Standard (2011-06-28 17:14:35)

## 2021-04-04 ENCOUNTER — Other Ambulatory Visit: Payer: Medicare Other

## 2021-04-05 ENCOUNTER — Ambulatory Visit: Payer: Medicare Other

## 2021-04-05 ENCOUNTER — Ambulatory Visit: Payer: Medicare Other | Admitting: Oncology

## 2021-04-19 ENCOUNTER — Inpatient Hospital Stay: Payer: Medicare Other | Attending: Oncology

## 2021-04-19 DIAGNOSIS — D509 Iron deficiency anemia, unspecified: Secondary | ICD-10-CM | POA: Insufficient documentation

## 2021-04-19 LAB — COMPREHENSIVE METABOLIC PANEL
ALT: 15 U/L (ref 0–44)
AST: 22 U/L (ref 15–41)
Albumin: 3.6 g/dL (ref 3.5–5.0)
Alkaline Phosphatase: 45 U/L (ref 38–126)
Anion gap: 10 (ref 5–15)
BUN: 49 mg/dL — ABNORMAL HIGH (ref 8–23)
CO2: 27 mmol/L (ref 22–32)
Calcium: 9 mg/dL (ref 8.9–10.3)
Chloride: 98 mmol/L (ref 98–111)
Creatinine, Ser: 1.09 mg/dL — ABNORMAL HIGH (ref 0.44–1.00)
GFR, Estimated: 49 mL/min — ABNORMAL LOW (ref >60)
Glucose, Bld: 184 mg/dL — ABNORMAL HIGH (ref 70–99)
Potassium: 4.5 mmol/L (ref 3.5–5.1)
Sodium: 135 mmol/L (ref 135–145)
Total Bilirubin: 0.4 mg/dL (ref 0.3–1.2)
Total Protein: 6.7 g/dL (ref 6.5–8.1)

## 2021-04-19 LAB — FERRITIN: Ferritin: 209 ng/mL (ref 11–307)

## 2021-04-19 LAB — CBC WITH DIFFERENTIAL/PLATELET
Abs Immature Granulocytes: 0.03 10*3/uL (ref 0.00–0.07)
Basophils Absolute: 0.1 10*3/uL (ref 0.0–0.1)
Basophils Relative: 1 %
Eosinophils Absolute: 0.8 10*3/uL — ABNORMAL HIGH (ref 0.0–0.5)
Eosinophils Relative: 11 %
HCT: 35.3 % — ABNORMAL LOW (ref 36.0–46.0)
Hemoglobin: 11 g/dL — ABNORMAL LOW (ref 12.0–15.0)
Immature Granulocytes: 0 %
Lymphocytes Relative: 20 %
Lymphs Abs: 1.5 10*3/uL (ref 0.7–4.0)
MCH: 27.9 pg (ref 26.0–34.0)
MCHC: 31.2 g/dL (ref 30.0–36.0)
MCV: 89.6 fL (ref 80.0–100.0)
Monocytes Absolute: 0.4 10*3/uL (ref 0.1–1.0)
Monocytes Relative: 6 %
Neutro Abs: 4.7 10*3/uL (ref 1.7–7.7)
Neutrophils Relative %: 62 %
Platelets: 311 10*3/uL (ref 150–400)
RBC: 3.94 MIL/uL (ref 3.87–5.11)
RDW: 17.7 % — ABNORMAL HIGH (ref 11.5–15.5)
WBC: 7.6 10*3/uL (ref 4.0–10.5)
nRBC: 0 % (ref 0.0–0.2)

## 2021-04-19 LAB — VITAMIN B12: Vitamin B-12: 566 pg/mL (ref 180–914)

## 2021-04-19 LAB — IRON AND TIBC
Iron: 37 ug/dL (ref 28–170)
Saturation Ratios: 11 % (ref 10.4–31.8)
TIBC: 325 ug/dL (ref 250–450)
UIBC: 288 ug/dL

## 2021-04-20 ENCOUNTER — Encounter: Payer: Self-pay | Admitting: Registered Nurse

## 2021-04-20 ENCOUNTER — Other Ambulatory Visit: Payer: Self-pay

## 2021-04-20 ENCOUNTER — Encounter: Payer: Medicare Other | Attending: Physical Medicine & Rehabilitation | Admitting: Registered Nurse

## 2021-04-20 VITALS — BP 135/72 | HR 67 | Temp 99.0°F | Ht 64.0 in | Wt 115.2 lb

## 2021-04-20 DIAGNOSIS — R531 Weakness: Secondary | ICD-10-CM | POA: Insufficient documentation

## 2021-04-20 DIAGNOSIS — R269 Unspecified abnormalities of gait and mobility: Secondary | ICD-10-CM | POA: Diagnosis present

## 2021-04-20 DIAGNOSIS — K222 Esophageal obstruction: Secondary | ICD-10-CM | POA: Insufficient documentation

## 2021-04-20 DIAGNOSIS — Z931 Gastrostomy status: Secondary | ICD-10-CM | POA: Diagnosis present

## 2021-04-20 DIAGNOSIS — I10 Essential (primary) hypertension: Secondary | ICD-10-CM | POA: Insufficient documentation

## 2021-04-20 DIAGNOSIS — E1165 Type 2 diabetes mellitus with hyperglycemia: Secondary | ICD-10-CM | POA: Diagnosis present

## 2021-04-20 DIAGNOSIS — I63512 Cerebral infarction due to unspecified occlusion or stenosis of left middle cerebral artery: Secondary | ICD-10-CM | POA: Diagnosis present

## 2021-04-20 NOTE — Progress Notes (Signed)
Subjective:    Patient ID: [name] [name], female    DOB: [birth year], 87 y.o.   MRN: [id number]  HPI: [name] [name] is a 87 y.o. female who returns for follow up appointment of her Right hemiparesis as well as expressive aphasia secondary to L MCA stroke, she is continuing with home health therapy Pt/OT and Nurse. She also states at times she has right arm and right hand sensation of tingling at times. She rates her pain on Health and History 0.   [name] was cleaning her drip pans off the stove and the pan nicked her ring finger, no drainage noted. Area cleansed and bandaid applied, daughter will continue to observe.  Daughter in room and all questions answered.    Pain Inventory Average Pain 0 Pain Right Now 0 My pain is aching  In the last 24 hours, has pain interfered with the following? General activity 4 Relation with others 7 Enjoyment of life 6 What TIME of [name] is your pain at its worst? night Sleep (in general) Fair  Pain is worse with: unsure Pain improves with: rest, heat/ice, therapy/exercise, and medication Relief from Meds: 9  Family History  Problem Relation Age of Onset   Stroke Mother    Hypertension Mother    Heart disease Mother    Cancer Father    Heart disease Father    Heart attack Sister    Diabetes Sister    Heart attack Brother    Social History   Socioeconomic History   Marital status: Widowed    Spouse name: Not on file   Number of children: Not on file   Years of education: Not on file   Highest education level: Not on file  Occupational History   Not on file  Tobacco Use   Smoking status: Never   Smokeless tobacco: Never  Vaping Use   Vaping Use: Never used  Substance and Sexual Activity   Alcohol use: Never   Drug use: Never   Sexual activity: Not Currently  Other Topics Concern   Not on file  Social History Narrative   Lives alone.    Daughter helps her.    Social Determinants of Health   Financial Resource Strain:  Not on file  Food Insecurity: Not on file  Transportation Needs: Not on file  Physical Activity: Not on file  Stress: Not on file  Social Connections: Not on file   Past Surgical History:  Procedure Laterality Date   ABDOMINAL HYSTERECTOMY     APPENDECTOMY     ARTHROGRAM KNEE Left    BACK SURGERY     CERVIVAL NECK FUSION   CATARACT EXTRACTION W/PHACO Left 11/11/2018   Procedure: CATARACT EXTRACTION PHACO AND INTRAOCULAR LENS PLACEMENT ([place]) LEFT, DIABETIC;  Surgeon: Birder Robson, MD;  Location: [facility] ORS;  Service: Ophthalmology;  Laterality: Left;  [place]  00:41 CDE 6.41 Fluid pack lot # [id number]   COLONOSCOPY WITH PROPOFOL N/A 06/20/2017   Procedure: COLONOSCOPY WITH PROPOFOL;  Surgeon: Lollie Sails, MD;  Location: [facility] ENDOSCOPY;  Service: Endoscopy;  Laterality: N/A;   ESOPHAGOGASTRODUODENOSCOPY (EGD) WITH PROPOFOL N/A 03/14/2015   Procedure: ESOPHAGOGASTRODUODENOSCOPY (EGD) WITH PROPOFOL;  Surgeon: Josefine Class, MD;  Location: [facility] ENDOSCOPY;  Service: Endoscopy;  Laterality: N/A;   ESOPHAGOGASTRODUODENOSCOPY (EGD) WITH PROPOFOL N/A 03/28/2017   Procedure: ESOPHAGOGASTRODUODENOSCOPY (EGD) WITH PROPOFOL;  Surgeon: Lollie Sails, MD;  Location: [facility] ENDOSCOPY;  Service: Endoscopy;  Laterality: N/A;   ESOPHAGOGASTRODUODENOSCOPY (EGD) WITH PROPOFOL N/A  06/20/2017   Procedure: ESOPHAGOGASTRODUODENOSCOPY (EGD) WITH PROPOFOL;  Surgeon: Lollie Sails, MD;  Location: [facility] ENDOSCOPY;  Service: Endoscopy;  Laterality: N/A;   ESOPHAGOGASTRODUODENOSCOPY (EGD) WITH PROPOFOL N/A 10/21/2017   Procedure: ESOPHAGOGASTRODUODENOSCOPY (EGD) WITH PROPOFOL;  Surgeon: Lollie Sails, MD;  Location: [name] ENDOSCOPY;  Service: Endoscopy;  Laterality: N/A;   ESOPHAGOGASTRODUODENOSCOPY (EGD) WITH PROPOFOL N/A 04/15/2018   Procedure: ESOPHAGOGASTRODUODENOSCOPY (EGD) WITH PROPOFOL;  Surgeon: Lollie Sails, MD;  Location: [facility] ENDOSCOPY;  Service: Endoscopy;  Laterality: N/A;   ESOPHAGUS  SURGERY     CLOSURE   EYE SURGERY     HYSTERECTOMY ABDOMINAL WITH SALPINGECTOMY     IR GASTROSTOMY TUBE MOD SED  06/11/2018   IR GASTROSTOMY TUBE REMOVAL  03/25/2019   IR REPLACE G-TUBE SIMPLE WO FLUORO  10/19/2020   PERIPHERAL VASCULAR CATHETERIZATION N/A 01/23/2016   Procedure: Visceral Venography;  Surgeon: Algernon Huxley, MD;  Location: [place] CV LAB;  Service: Cardiovascular;  Laterality: N/A;   PERIPHERAL VASCULAR CATHETERIZATION  01/23/2016   Procedure: Peripheral Vascular Intervention;  Surgeon: Algernon Huxley, MD;  Location: [place] CV LAB;  Service: Cardiovascular;;   UPPER ESOPHAGEAL ENDOSCOPIC ULTRASOUND (EUS) N/A 12/05/2017   Procedure: UPPER ESOPHAGEAL ENDOSCOPIC ULTRASOUND (EUS);  Surgeon: Jola Schmidt, MD;  Location: [facility] ENDOSCOPY;  Service: Endoscopy;  Laterality: N/A;   VISCERAL ANGIOGRAPHY N/A 03/18/2017   Procedure: Visceral Angiography;  Surgeon: Algernon Huxley, MD;  Location: [place] CV LAB;  Service: Cardiovascular;  Laterality: N/A;   VISCERAL ARTERY INTERVENTION N/A 03/18/2017   Procedure: Visceral Artery Intervention;  Surgeon: Algernon Huxley, MD;  Location: [place] CV LAB;  Service: Cardiovascular;  Laterality: N/A;   Past Surgical History:  Procedure Laterality Date   ABDOMINAL HYSTERECTOMY     APPENDECTOMY     ARTHROGRAM KNEE Left    BACK SURGERY     CERVIVAL NECK FUSION   CATARACT EXTRACTION W/PHACO Left 11/11/2018   Procedure: CATARACT EXTRACTION PHACO AND INTRAOCULAR LENS PLACEMENT ([facility]) LEFT, DIABETIC;  Surgeon: Birder Robson, MD;  Location: [facility] ORS;  Service: Ophthalmology;  Laterality: Left;  [place]  00:41 CDE 6.41 Fluid pack lot # [id number]   COLONOSCOPY WITH PROPOFOL N/A 06/20/2017   Procedure: COLONOSCOPY WITH PROPOFOL;  Surgeon: Lollie Sails, MD;  Location: [facility] ENDOSCOPY;  Service: Endoscopy;  Laterality: N/A;   ESOPHAGOGASTRODUODENOSCOPY (EGD) WITH PROPOFOL N/A 03/14/2015   Procedure: ESOPHAGOGASTRODUODENOSCOPY (EGD) WITH PROPOFOL;   Surgeon: Josefine Class, MD;  Location: [facility] ENDOSCOPY;  Service: Endoscopy;  Laterality: N/A;   ESOPHAGOGASTRODUODENOSCOPY (EGD) WITH PROPOFOL N/A 03/28/2017   Procedure: ESOPHAGOGASTRODUODENOSCOPY (EGD) WITH PROPOFOL;  Surgeon: Lollie Sails, MD;  Location: [facility] ENDOSCOPY;  Service: Endoscopy;  Laterality: N/A;   ESOPHAGOGASTRODUODENOSCOPY (EGD) WITH PROPOFOL N/A 06/20/2017   Procedure: ESOPHAGOGASTRODUODENOSCOPY (EGD) WITH PROPOFOL;  Surgeon: Lollie Sails, MD;  Location: [facility] ENDOSCOPY;  Service: Endoscopy;  Laterality: N/A;   ESOPHAGOGASTRODUODENOSCOPY (EGD) WITH PROPOFOL N/A 10/21/2017   Procedure: ESOPHAGOGASTRODUODENOSCOPY (EGD) WITH PROPOFOL;  Surgeon: Lollie Sails, MD;  Location: [facility] ENDOSCOPY;  Service: Endoscopy;  Laterality: N/A;   ESOPHAGOGASTRODUODENOSCOPY (EGD) WITH PROPOFOL N/A 04/15/2018   Procedure: ESOPHAGOGASTRODUODENOSCOPY (EGD) WITH PROPOFOL;  Surgeon: Lollie Sails, MD;  Location: [facility] ENDOSCOPY;  Service: Endoscopy;  Laterality: N/A;   ESOPHAGUS SURGERY     CLOSURE   EYE SURGERY     HYSTERECTOMY ABDOMINAL WITH SALPINGECTOMY     IR GASTROSTOMY TUBE MOD SED  06/11/2018   IR GASTROSTOMY TUBE REMOVAL  03/25/2019   IR REPLACE G-TUBE  SIMPLE WO FLUORO  10/19/2020   PERIPHERAL VASCULAR CATHETERIZATION N/A 01/23/2016   Procedure: Visceral Venography;  Surgeon: Algernon Huxley, MD;  Location: [place] CV LAB;  Service: Cardiovascular;  Laterality: N/A;   PERIPHERAL VASCULAR CATHETERIZATION  01/23/2016   Procedure: Peripheral Vascular Intervention;  Surgeon: Algernon Huxley, MD;  Location: [place] CV LAB;  Service: Cardiovascular;;   UPPER ESOPHAGEAL ENDOSCOPIC ULTRASOUND (EUS) N/A 12/05/2017   Procedure: UPPER ESOPHAGEAL ENDOSCOPIC ULTRASOUND (EUS);  Surgeon: Jola Schmidt, MD;  Location: [facility] ENDOSCOPY;  Service: Endoscopy;  Laterality: N/A;   VISCERAL ANGIOGRAPHY N/A 03/18/2017   Procedure: Visceral Angiography;  Surgeon: Algernon Huxley, MD;  Location: [place] CV LAB;  Service: Cardiovascular;  Laterality: N/A;   VISCERAL ARTERY INTERVENTION N/A 03/18/2017   Procedure: Visceral Artery Intervention;  Surgeon: Algernon Huxley, MD;  Location: [place] CV LAB;  Service: Cardiovascular;  Laterality: N/A;   Past Medical History:  Diagnosis Date   Anemia    IRON INFUSIONS   Aortic valve sclerosis    Arrhythmia    Arthritis    osteoarthritis   Basal cell carcinoma of skin    Brain tumor ([place])    Brain tumor ([place])    Cervical spine disease    CHF (congestive heart failure) (HCC)    Diabetes mellitus without complication (HCC)    Dysrhythmia    sinus arrhythmia   Esophageal stricture    severe, led to feeding tube placement in Sept 2019   Esophageal ulcer without bleeding    Gastrostomy tube dependent (HCC)    DOES NOT EAT OR DRINK    GERD (gastroesophageal reflux disease)    History of kidney stones    Hyperlipemia    Hypertension    Kidney stones    Leaky heart valve    Meningioma (HCC)    Occlusive mesenteric ischemia (HCC)    Pulmonary hypertension (HCC)    RLS (restless legs syndrome)    Vertigo    BP 135/72 (BP Location: Right Arm)   Pulse 67   Temp 99 F (37.2 C) (Oral)   Ht 5\' 4"  (1.626 m)   Wt 115 lb 3.2 oz (52.3 kg)   SpO2 98%   BMI 19.77 kg/m   Opioid Risk Score:   Fall Risk Score:  `1  Depression screen PHQ 2/9  Depression screen [facility] 2/9 01/19/2021 01/19/2021 01/08/2018  Decreased Interest 0 0 0  Down, Depressed, Hopeless 0 0 0  PHQ - 2 Score 0 0 0  Altered sleeping 0 - -  Tired, decreased energy 2 - -  Change in appetite 0 - -  Feeling bad or failure about yourself  0 - -  Trouble concentrating 0 - -  Moving slowly or fidgety/restless 0 - -  Suicidal thoughts 0 - -  PHQ-9 Score 2 - -  Some recent data might be hidden       Review of Systems  Constitutional: Negative.   HENT: Negative.    Eyes: Negative.   Respiratory: Negative.    Endocrine: Negative.   Genitourinary: Negative.    Musculoskeletal:        Ain in right arm/hand  Skin: Negative.   Allergic/Immunologic: Negative.   Neurological:  Positive for weakness.       Weakness right hand from stroke  Hematological: Negative.   Psychiatric/Behavioral: Negative.        Objective:   Physical Exam Vitals and nursing note reviewed.  Constitutional:      Appearance:  Normal appearance.  Cardiovascular:     Rate and Rhythm: Normal rate and regular rhythm.     Pulses: Normal pulses.     Heart sounds: Normal heart sounds.  Pulmonary:     Effort: Pulmonary effort is normal.     Breath sounds: Normal breath sounds.  Abdominal:     General: Bowel sounds are normal.     Palpations: Abdomen is soft.     Comments: Peg Tube in Placed  Musculoskeletal:     Cervical back: Normal range of motion and neck supple.     Comments: Normal Muscle Bulk and Muscle Testing Reveals:  Upper Extremities: Full ROM and Muscle Strength on Right 3/5 nd Left 5/5 Lower Extremities : Full ROM and Muscle Strength 5/5 Arises from Table slowly using walker for support Narrow Based Gait     Skin:    General: Skin is warm and dry.  Neurological:     Mental Status: She is alert and oriented to person, place, and time.  Psychiatric:        Mood and Affect: Mood normal.        Behavior: Behavior normal.         Assessment & Plan:  Female with history of T2DM, CHF, DDD, mesenteric ischemia, esophageal stricture--on tube feeds who was admitted on [facility] on 12/17/20 with SOB due to pulmonary edema as well as right sided weakness with difficulty speaking due to acute/subacute L-MCA infarct as well as incidental small aneurysm v/s infudibulum.    1.  Right hemiparesis as well as expressive aphasia secondary to L MCA stroke: [place] with [facility]. Neurology Following.    2. Essential Hypertension: Controlled. Continue current medication regimen. PCP Following. Continue to Monitor.    3. Esophageal  stricture/mesenteric ischemia: Continue Tube Feeds QID: PCP Following.                4.  Controlled Type DM: Continue current medication regimen. PCP Following. Continue to Monitor.   5. Gait abnormality: Continual use with Rolator. Continue Home Health Therapies. Continue to Monitor.   F/U in 6 months.

## 2021-04-21 LAB — COPPER, SERUM: Copper: 82 ug/dL (ref 80–158)

## 2021-05-12 ENCOUNTER — Other Ambulatory Visit: Payer: Self-pay

## 2021-05-12 DIAGNOSIS — D649 Anemia, unspecified: Secondary | ICD-10-CM

## 2021-05-12 DIAGNOSIS — D509 Iron deficiency anemia, unspecified: Secondary | ICD-10-CM

## 2021-05-16 ENCOUNTER — Inpatient Hospital Stay: Payer: Medicare Other

## 2021-05-16 ENCOUNTER — Encounter: Payer: Self-pay | Admitting: Oncology

## 2021-05-16 ENCOUNTER — Inpatient Hospital Stay (HOSPITAL_BASED_OUTPATIENT_CLINIC_OR_DEPARTMENT_OTHER): Payer: Medicare Other | Admitting: Oncology

## 2021-05-16 ENCOUNTER — Inpatient Hospital Stay: Payer: Medicare Other | Attending: Oncology

## 2021-05-16 VITALS — BP 135/68 | HR 66 | Resp 16

## 2021-05-16 VITALS — BP 118/43 | HR 63 | Temp 97.6°F | Resp 16 | Ht 64.0 in | Wt 110.0 lb

## 2021-05-16 DIAGNOSIS — D509 Iron deficiency anemia, unspecified: Secondary | ICD-10-CM

## 2021-05-16 DIAGNOSIS — D649 Anemia, unspecified: Secondary | ICD-10-CM

## 2021-05-16 LAB — CBC WITH DIFFERENTIAL/PLATELET
Abs Immature Granulocytes: 0.03 10*3/uL (ref 0.00–0.07)
Basophils Absolute: 0.1 10*3/uL (ref 0.0–0.1)
Basophils Relative: 1 %
Eosinophils Absolute: 0.6 10*3/uL — ABNORMAL HIGH (ref 0.0–0.5)
Eosinophils Relative: 7 %
HCT: 28.8 % — ABNORMAL LOW (ref 36.0–46.0)
Hemoglobin: 9 g/dL — ABNORMAL LOW (ref 12.0–15.0)
Immature Granulocytes: 0 %
Lymphocytes Relative: 16 %
Lymphs Abs: 1.2 10*3/uL (ref 0.7–4.0)
MCH: 29.4 pg (ref 26.0–34.0)
MCHC: 31.3 g/dL (ref 30.0–36.0)
MCV: 94.1 fL (ref 80.0–100.0)
Monocytes Absolute: 0.5 10*3/uL (ref 0.1–1.0)
Monocytes Relative: 7 %
Neutro Abs: 5.4 10*3/uL (ref 1.7–7.7)
Neutrophils Relative %: 69 %
Platelets: 325 10*3/uL (ref 150–400)
RBC: 3.06 MIL/uL — ABNORMAL LOW (ref 3.87–5.11)
RDW: 18.2 % — ABNORMAL HIGH (ref 11.5–15.5)
WBC: 7.8 10*3/uL (ref 4.0–10.5)
nRBC: 0 % (ref 0.0–0.2)

## 2021-05-16 LAB — IRON AND TIBC
Iron: 49 ug/dL (ref 28–170)
Saturation Ratios: 13 % (ref 10.4–31.8)
TIBC: 374 ug/dL (ref 250–450)
UIBC: 325 ug/dL

## 2021-05-16 LAB — COMPREHENSIVE METABOLIC PANEL
ALT: 14 U/L (ref 0–44)
AST: 20 U/L (ref 15–41)
Albumin: 3.7 g/dL (ref 3.5–5.0)
Alkaline Phosphatase: 41 U/L (ref 38–126)
Anion gap: 8 (ref 5–15)
BUN: 47 mg/dL — ABNORMAL HIGH (ref 8–23)
CO2: 31 mmol/L (ref 22–32)
Calcium: 8.8 mg/dL — ABNORMAL LOW (ref 8.9–10.3)
Chloride: 97 mmol/L — ABNORMAL LOW (ref 98–111)
Creatinine, Ser: 1.15 mg/dL — ABNORMAL HIGH (ref 0.44–1.00)
GFR, Estimated: 46 mL/min — ABNORMAL LOW (ref >60)
Glucose, Bld: 124 mg/dL — ABNORMAL HIGH (ref 70–99)
Potassium: 4.5 mmol/L (ref 3.5–5.1)
Sodium: 136 mmol/L (ref 135–145)
Total Bilirubin: 0.6 mg/dL (ref 0.3–1.2)
Total Protein: 6.6 g/dL (ref 6.5–8.1)

## 2021-05-16 LAB — VITAMIN B12: Vitamin B-12: 649 pg/mL (ref 180–914)

## 2021-05-16 LAB — FERRITIN: Ferritin: 67 ng/mL (ref 11–307)

## 2021-05-16 MED ORDER — IRON SUCROSE 20 MG/ML IV SOLN
200.0000 mg | Freq: Once | INTRAVENOUS | Status: AC
  Administered 2021-05-16: 200 mg via INTRAVENOUS
  Filled 2021-05-16: qty 10

## 2021-05-16 MED ORDER — SODIUM CHLORIDE 0.9 % IV SOLN: 200.0000 mg | INTRAVENOUS | Status: DC

## 2021-05-16 MED ORDER — SODIUM CHLORIDE 0.9 % IV SOLN
Freq: Once | INTRAVENOUS | Status: AC
  Filled 2021-05-16: qty 250

## 2021-05-16 NOTE — Progress Notes (Signed)
Pt here for anemia- she has been tires for over 1 1/2 weeks. She gets tube feeding for years and she is on [employer]. She  has bowel movements several time a day and if it gets loose she will have immodium

## 2021-05-16 NOTE — Progress Notes (Signed)
Hematology/Oncology Consult note [facility]  Telephone:([phone] [phone]  Patient Care Team: [name], MD as PCP - General (Internal Medicine) [name], MD (Internal Medicine) Ok Edwards, NP as Nurse Practitioner (Gastroenterology) Lollie Sails, MD (Inactive) as Consulting Physician (Gastroenterology) Metamora Callas, NP as Nurse Practitioner (Infectious Diseases)   Name of the patient: [name]  [id number]  19-Jan-1929   Date of visit: 05/16/21  Diagnosis-iron deficiency anemia  Chief complaint/ Reason for visit-routine follow-up of iron deficiency anemia  Heme/Onc history: Patient is a 87 year old female with history of iron deficiency anemia.  Her last GI work-up including endoscopy and colonoscopy was back in 2018 with Dr. Gustavo Lah.  She does have esophageal stricture which could not be dilated persistently and therefore she has a PEG tube.  She has received IV iron in the past as well as intermittent blood transfusion when her hemoglobin drops.  Also has a history of stroke in March 2022.  Interval history-currently reports ongoing fatigue which is worse over the last couple of weeks.  Denies any blood loss in her stool or urine.  Denies any dark melanotic stools  ECOG PS-12 Pain scale- 0   Review of systems- Review of Systems  Constitutional:  Positive for malaise/fatigue. Negative for chills, fever and weight loss.  HENT:  Negative for congestion, ear discharge and nosebleeds.   Eyes:  Negative for blurred vision.  Respiratory:  Negative for cough, hemoptysis, sputum production, shortness of breath and wheezing.   Cardiovascular:  Negative for chest pain, palpitations, orthopnea and claudication.  Gastrointestinal:  Negative for abdominal pain, blood in stool, constipation, diarrhea, heartburn, melena, nausea and vomiting.  Genitourinary:  Negative for dysuria, flank pain, frequency,  hematuria and urgency.  Musculoskeletal:  Negative for back pain, joint pain and myalgias.  Skin:  Negative for rash.  Neurological:  Negative for dizziness, tingling, focal weakness, seizures, weakness and headaches.  Endo/Heme/Allergies:  Does not bruise/bleed easily.  Psychiatric/Behavioral:  Negative for depression and suicidal ideas. The patient does not have insomnia.      Allergies  Allergen Reactions   Elemental Sulfur Diarrhea and Nausea And Vomiting   Gabapentin Swelling   Lipitor [Atorvastatin] Other (See Comments)    Muscle aches   Mevacor [Lovastatin] Other (See Comments)    Muscle aches   Milk-Related Compounds Other (See Comments)    Large quantities cause headaches    Septra [Sulfamethoxazole-Trimethoprim] Other (See Comments)    Unknown   Statins Other (See Comments)    Muscle pain   Zocor [Simvastatin] Other (See Comments)    Muscle aches     Past Medical History:  Diagnosis Date   Anemia    IRON INFUSIONS   Aortic valve sclerosis    Arrhythmia    Arthritis    osteoarthritis   Basal cell carcinoma of skin    Brain tumor (HCC)    Brain tumor ([place])    Cervical spine disease    CHF (congestive heart failure) (HCC)    Diabetes mellitus without complication (HCC)    Dysrhythmia    sinus arrhythmia   Esophageal stricture    severe, led to feeding tube placement in Sept 2019   Esophageal ulcer without bleeding    Gastrostomy tube dependent (HCC)    DOES NOT EAT OR DRINK    GERD (gastroesophageal reflux disease)    Hearing loss    pt had hearing test  last month, left ear30% hearing due to stroke,  78% in right-normal for her age   History of kidney stones    Hyperlipemia    Hypertension    Kidney stones    Leaky heart valve    Meningioma (HCC)    Occlusive mesenteric ischemia (HCC)    Pulmonary hypertension (HCC)    RLS (restless legs syndrome)    Stroke (HCC)    Vertigo      Past Surgical History:  Procedure Laterality Date   ABDOMINAL  HYSTERECTOMY     APPENDECTOMY     ARTHROGRAM KNEE Left    BACK SURGERY     CERVIVAL NECK FUSION   CATARACT EXTRACTION W/PHACO Left 11/11/2018   Procedure: CATARACT EXTRACTION PHACO AND INTRAOCULAR LENS PLACEMENT ([place]) LEFT, DIABETIC;  Surgeon: Birder Robson, MD;  Location: [facility] ORS;  Service: Ophthalmology;  Laterality: Left;  [place]  00:41 CDE 6.41 Fluid pack lot # [id number]   COLONOSCOPY WITH PROPOFOL N/A 06/20/2017   Procedure: COLONOSCOPY WITH PROPOFOL;  Surgeon: Lollie Sails, MD;  Location: [facility] ENDOSCOPY;  Service: Endoscopy;  Laterality: N/A;   ESOPHAGOGASTRODUODENOSCOPY (EGD) WITH PROPOFOL N/A 03/14/2015   Procedure: ESOPHAGOGASTRODUODENOSCOPY (EGD) WITH PROPOFOL;  Surgeon: Josefine Class, MD;  Location: [facility] ENDOSCOPY;  Service: Endoscopy;  Laterality: N/A;   ESOPHAGOGASTRODUODENOSCOPY (EGD) WITH PROPOFOL N/A 03/28/2017   Procedure: ESOPHAGOGASTRODUODENOSCOPY (EGD) WITH PROPOFOL;  Surgeon: Lollie Sails, MD;  Location: [facility] ENDOSCOPY;  Service: Endoscopy;  Laterality: N/A;   ESOPHAGOGASTRODUODENOSCOPY (EGD) WITH PROPOFOL N/A 06/20/2017   Procedure: ESOPHAGOGASTRODUODENOSCOPY (EGD) WITH PROPOFOL;  Surgeon: Lollie Sails, MD;  Location: [facility] ENDOSCOPY;  Service: Endoscopy;  Laterality: N/A;   ESOPHAGOGASTRODUODENOSCOPY (EGD) WITH PROPOFOL N/A 10/21/2017   Procedure: ESOPHAGOGASTRODUODENOSCOPY (EGD) WITH PROPOFOL;  Surgeon: Lollie Sails, MD;  Location: [facility] ENDOSCOPY;  Service: Endoscopy;  Laterality: N/A;   ESOPHAGOGASTRODUODENOSCOPY (EGD) WITH PROPOFOL N/A 04/15/2018   Procedure: ESOPHAGOGASTRODUODENOSCOPY (EGD) WITH PROPOFOL;  Surgeon: Lollie Sails, MD;  Location: [facility] ENDOSCOPY;  Service: Endoscopy;  Laterality: N/A;   ESOPHAGUS SURGERY     CLOSURE   EYE SURGERY     HYSTERECTOMY ABDOMINAL WITH SALPINGECTOMY     IR GASTROSTOMY TUBE MOD SED  06/11/2018   IR GASTROSTOMY TUBE REMOVAL  03/25/2019   IR REPLACE G-TUBE SIMPLE WO FLUORO  10/19/2020   PERIPHERAL  VASCULAR CATHETERIZATION N/A 01/23/2016   Procedure: Visceral Venography;  Surgeon: Algernon Huxley, MD;  Location: [place] CV LAB;  Service: Cardiovascular;  Laterality: N/A;   PERIPHERAL VASCULAR CATHETERIZATION  01/23/2016   Procedure: Peripheral Vascular Intervention;  Surgeon: Algernon Huxley, MD;  Location: [place] CV LAB;  Service: Cardiovascular;;   UPPER ESOPHAGEAL ENDOSCOPIC ULTRASOUND (EUS) N/A 12/05/2017   Procedure: UPPER ESOPHAGEAL ENDOSCOPIC ULTRASOUND (EUS);  Surgeon: Jola Schmidt, MD;  Location: [facility] ENDOSCOPY;  Service: Endoscopy;  Laterality: N/A;   VISCERAL ANGIOGRAPHY N/A 03/18/2017   Procedure: Visceral Angiography;  Surgeon: Algernon Huxley, MD;  Location: [place] CV LAB;  Service: Cardiovascular;  Laterality: N/A;   VISCERAL ARTERY INTERVENTION N/A 03/18/2017   Procedure: Visceral Artery Intervention;  Surgeon: Algernon Huxley, MD;  Location: [place] CV LAB;  Service: Cardiovascular;  Laterality: N/A;    Social History   Socioeconomic History   Marital status: Widowed    Spouse name: Not on file   Number of children: Not on file   Years of education: Not on file   Highest education level: Not on file  Occupational History   Not on file  Tobacco Use   Smoking status: Never  Smokeless tobacco: Never  Vaping Use   Vaping Use: Never used  Substance and Sexual Activity   Alcohol use: Never   Drug use: Never   Sexual activity: Not Currently  Other Topics Concern   Not on file  Social History Narrative   Lives alone.    Daughter helps her.    Social Determinants of Health   Financial Resource Strain: Not on file  Food Insecurity: Not on file  Transportation Needs: Not on file  Physical Activity: Not on file  Stress: Not on file  Social Connections: Not on file  Intimate Partner Violence: Not on file    Family History  Problem Relation Age of Onset   Stroke Mother    Hypertension Mother    Heart disease Mother    Cancer Father    Heart  disease Father    Heart attack Sister    Diabetes Sister    Heart attack Brother    Thyroid cancer Daughter    Cancer - Colon Daughter      Current Outpatient Medications:    acetaminophen (TYLENOL) 160 MG/5ML solution, Place 20.3 mLs (650 mg total) into feeding tube every 6 (six) hours as needed for mild pain., Disp: 120 mL, Rfl: 0   aspirin 81 MG chewable tablet, Place 1 tablet (81 mg total) into feeding tube daily., Disp: 30 tablet, Rfl: 0   carvedilol (COREG) 3.125 MG tablet, Place 1 tablet (3.125 mg total) into feeding tube 2 (two) times daily with a meal., Disp: 30 tablet, Rfl: 3   clopidogrel (PLAVIX) 75 MG tablet, GIVE 1 TABLET VIA FEEDING TUBE EVERY [name] AS DIRECTED, Disp: 30 tablet, Rfl: 2   furosemide (LASIX) 20 MG tablet, Place 0.5 tablets (10 mg total) into feeding tube daily. (Patient taking differently: Place 20 mg into feeding tube 2 (two) times daily.), Disp: 30 tablet, Rfl: 0   glipiZIDE (GLUCOTROL XL) 2.5 MG 24 hr tablet, Take 2.5 mg by mouth at bedtime., Disp: , Rfl:    glipiZIDE (GLUCOTROL) 5 MG tablet, Take 5 mg by mouth in the morning., Disp: , Rfl:    hydrocerin (EUCERIN) CREA, Apply 1 application topically 2 (two) times daily. To bilateral feet, Disp: , Rfl: 0   Hypromellose 0.2 % SOLN, Place 1 drop into both eyes 3 (three) times daily as needed (dry eyes)., Disp: , Rfl:    lisinopril (ZESTRIL) 2.5 MG tablet, Take 1 tablet (2.5 mg total) by mouth daily., Disp: 30 tablet, Rfl: 0   loperamide (IMODIUM) 1 MG/5ML solution, Take 1 mg by mouth as needed for diarrhea or loose stools., Disp: , Rfl:    metFORMIN (GLUCOPHAGE) 500 MG tablet, Place 1 tablet (500 mg total) into feeding tube 2 (two) times daily with a meal., Disp: 30 tablet, Rfl: 1   Multiple Vitamin (MULTIVITAMIN) LIQD, Place 15 mLs into feeding tube daily., Disp: 450 mL, Rfl: 1   Nutritional Supplements (FEEDING SUPPLEMENT, KATE FARMS STANDARD 1.4,) LIQD liquid, Place 325 mLs into feeding tube 4 (four) times daily.  (Patient taking differently: Place 325 mLs into feeding tube 3 (three) times daily between meals.), Disp: , Rfl:    pantoprazole sodium (PROTONIX) 40 mg/20 mL PACK, Place 20 mLs (40 mg total) into feeding tube daily., Disp: 600 mL, Rfl: 0   rOPINIRole (REQUIP) 2 MG tablet, Place 1 tablet (2 mg total) into feeding tube 2 (two) times daily., Disp: 60 tablet, Rfl: 0   Water For Irrigation, Sterile (FREE WATER) SOLN, Place 100 mLs into feeding  tube every 6 (six) hours., Disp: , Rfl:    polyethylene glycol (MIRALAX / GLYCOLAX) 17 g packet, Place 17 g into feeding tube daily as needed. (Patient not taking: Reported on 05/16/2021), Disp: 14 each, Rfl: 0  Physical exam:  Vitals:   05/16/21 1345  BP: (!) 118/43  Pulse: 63  Resp: 16  Temp: 97.6 F (36.4 C)  TempSrc: Tympanic  Weight: 110 lb (49.9 kg)  Height: '5\' 4"'$  (1.626 m)   Physical Exam Constitutional:      Comments: Sitting in a wheelchair.  Appears in no acute distress  Eyes:     Pupils: Pupils are equal, round, and reactive to light.  Cardiovascular:     Rate and Rhythm: Normal rate and regular rhythm.     Heart sounds: Normal heart sounds.  Pulmonary:     Effort: Pulmonary effort is normal.     Breath sounds: Normal breath sounds.  Abdominal:     General: Bowel sounds are normal.     Palpations: Abdomen is soft.     Comments: PEG tube in place  Skin:    General: Skin is warm and dry.  Neurological:     Mental Status: She is alert and oriented to person, place, and time.     CMP Latest Ref Rng & Units 05/16/2021  Glucose 70 - 99 mg/dL 124(H)  BUN 8 - 23 mg/dL 47(H)  Creatinine 0.44 - 1.00 mg/dL 1.15(H)  Sodium 135 - 145 mmol/L 136  Potassium 3.5 - 5.1 mmol/L 4.5  Chloride 98 - 111 mmol/L 97(L)  CO2 22 - 32 mmol/L 31  Calcium 8.9 - 10.3 mg/dL 8.8(L)  Total Protein 6.5 - 8.1 g/dL 6.6  Total Bilirubin 0.3 - 1.2 mg/dL 0.6  Alkaline Phos 38 - 126 U/L 41  AST 15 - 41 U/L 20  ALT 0 - 44 U/L 14   CBC Latest Ref Rng & Units  05/16/2021  WBC 4.0 - 10.5 K/uL 7.8  Hemoglobin 12.0 - 15.0 g/dL 9.0(L)  Hematocrit 36.0 - 46.0 % 28.8(L)  Platelets 150 - 400 K/uL 325     Assessment and plan- Patient is a 87 y.o. female with history of iron deficiency anemia here for routine follow-up   Patient's hemoglobin was 11 back in July 2021 is dropped down to 9 today.  Anemia is normocytic.  Ferritin levels are  normal at 67 with an iron saturation of 13%.  B12 and copper levels are pending.  Given the drop in her hemoglobin or ongoing fatigue and an iron saturation of less than 20% I will proceed with 5 doses of Venofer at this time.  Repeat CBC ferritin and iron studies in 2 4 and 6 months and I will see her back in 6 months   Visit Diagnosis 1. Iron deficiency anemia, unspecified iron deficiency anemia type      Dr. Randa Evens, MD, MPH [facility] at [facility] [id number] 05/16/2021 4:32 PM

## 2021-05-16 NOTE — Patient Instructions (Signed)

## 2021-05-18 LAB — COPPER, SERUM: Copper: 84 ug/dL (ref 80–158)

## 2021-05-19 ENCOUNTER — Inpatient Hospital Stay: Payer: Medicare Other

## 2021-05-19 ENCOUNTER — Ambulatory Visit: Payer: Medicare Other

## 2021-05-19 VITALS — BP 126/73 | HR 70 | Temp 96.0°F

## 2021-05-19 DIAGNOSIS — D509 Iron deficiency anemia, unspecified: Secondary | ICD-10-CM | POA: Diagnosis not present

## 2021-05-19 MED ORDER — SODIUM CHLORIDE 0.9 % IV SOLN
Freq: Once | INTRAVENOUS | Status: AC
  Filled 2021-05-19: qty 250

## 2021-05-19 MED ORDER — IRON SUCROSE 20 MG/ML IV SOLN
200.0000 mg | Freq: Once | INTRAVENOUS | Status: AC
Start: 2021-05-19 — End: 2021-05-19
  Administered 2021-05-19: 200 mg via INTRAVENOUS
  Filled 2021-05-19: qty 10

## 2021-05-19 MED ORDER — SODIUM CHLORIDE 0.9 % IV SOLN: 200.0000 mg | INTRAVENOUS | Status: DC

## 2021-05-19 NOTE — Patient Instructions (Signed)

## 2021-05-22 ENCOUNTER — Inpatient Hospital Stay: Payer: Medicare Other

## 2021-05-22 VITALS — BP 126/43 | HR 57 | Temp 96.0°F | Resp 17

## 2021-05-22 DIAGNOSIS — D509 Iron deficiency anemia, unspecified: Secondary | ICD-10-CM

## 2021-05-22 MED ORDER — SODIUM CHLORIDE 0.9 % IV SOLN: 200.0000 mg | INTRAVENOUS | Status: DC

## 2021-05-22 MED ORDER — IRON SUCROSE 20 MG/ML IV SOLN
200.0000 mg | Freq: Once | INTRAVENOUS | Status: AC
  Administered 2021-05-22: 200 mg via INTRAVENOUS
  Filled 2021-05-22: qty 10

## 2021-05-22 MED ORDER — SODIUM CHLORIDE 0.9 % IV SOLN
Freq: Once | INTRAVENOUS | Status: AC
  Filled 2021-05-22: qty 250

## 2021-05-22 NOTE — Patient Instructions (Signed)

## 2021-05-24 ENCOUNTER — Ambulatory Visit: Payer: Medicare Other

## 2021-05-24 ENCOUNTER — Inpatient Hospital Stay: Payer: Medicare Other

## 2021-05-24 VITALS — BP 122/43 | HR 55 | Temp 96.0°F | Resp 16

## 2021-05-24 DIAGNOSIS — D509 Iron deficiency anemia, unspecified: Secondary | ICD-10-CM | POA: Diagnosis not present

## 2021-05-24 MED ORDER — SODIUM CHLORIDE 0.9 % IV SOLN: 200.0000 mg | INTRAVENOUS | Status: DC

## 2021-05-24 MED ORDER — IRON SUCROSE 20 MG/ML IV SOLN
200.0000 mg | Freq: Once | INTRAVENOUS | Status: AC
  Administered 2021-05-24: 200 mg via INTRAVENOUS
  Filled 2021-05-24: qty 10

## 2021-05-24 MED ORDER — SODIUM CHLORIDE 0.9 % IV SOLN
Freq: Once | INTRAVENOUS | Status: AC
  Filled 2021-05-24: qty 250

## 2021-05-24 NOTE — Patient Instructions (Signed)

## 2021-05-26 ENCOUNTER — Ambulatory Visit: Payer: Medicare Other

## 2021-05-29 ENCOUNTER — Inpatient Hospital Stay: Payer: Medicare Other

## 2021-05-29 VITALS — BP 138/57 | HR 63 | Temp 95.9°F | Resp 18

## 2021-05-29 DIAGNOSIS — D509 Iron deficiency anemia, unspecified: Secondary | ICD-10-CM | POA: Diagnosis not present

## 2021-05-29 MED ORDER — IRON SUCROSE 20 MG/ML IV SOLN
200.0000 mg | Freq: Once | INTRAVENOUS | Status: AC
  Administered 2021-05-29: 200 mg via INTRAVENOUS
  Filled 2021-05-29: qty 10

## 2021-05-29 MED ORDER — SODIUM CHLORIDE 0.9 % IV SOLN: 200.0000 mg | INTRAVENOUS | Status: DC

## 2021-05-29 MED ORDER — SODIUM CHLORIDE 0.9 % IV SOLN
Freq: Once | INTRAVENOUS | Status: AC
  Filled 2021-05-29: qty 250

## 2021-05-29 NOTE — Patient Instructions (Signed)
[facility] [name] [facility]  Discharge Instructions: Thank you for choosing [facility] to provide your oncology and hematology care.  If you have a lab appointment with the Cancer Center, please go directly to the Cancer Center and check in at the registration area.  Wear comfortable clothing and clothing appropriate for easy access to any Portacath or PICC line.   We strive to give you quality time with your provider. You may need to reschedule your appointment if you arrive late (15 or more minutes).  Arriving late affects you and other patients whose appointments are after yours.  Also, if you miss three or more appointments without notifying the office, you may be dismissed from the clinic at the provider's discretion.      For prescription refill requests, have your pharmacy contact our office and allow 72 hours for refills to be completed.    Today you received the following chemotherapy and/or immunotherapy agents VENOFER      To help prevent nausea and vomiting after your treatment, we encourage you to take your nausea medication as directed.  BELOW ARE SYMPTOMS THAT SHOULD BE REPORTED IMMEDIATELY: *FEVER GREATER THAN 100.4 F (38 C) OR HIGHER *CHILLS OR SWEATING *NAUSEA AND VOMITING THAT IS NOT CONTROLLED WITH YOUR NAUSEA MEDICATION *UNUSUAL SHORTNESS OF BREATH *UNUSUAL BRUISING OR BLEEDING *URINARY PROBLEMS (pain or burning when urinating, or frequent urination) *BOWEL PROBLEMS (unusual diarrhea, constipation, pain near the anus) TENDERNESS IN MOUTH AND THROAT WITH OR WITHOUT PRESENCE OF ULCERS (sore throat, sores in mouth, or a toothache) UNUSUAL RASH, SWELLING OR PAIN  UNUSUAL VAGINAL DISCHARGE OR ITCHING   Items with * indicate a potential emergency and should be followed up as soon as possible or go to the Emergency Department if any problems should occur.  Please show the CHEMOTHERAPY ALERT CARD or IMMUNOTHERAPY ALERT CARD at check-in to  the Emergency Department and triage nurse.  Should you have questions after your visit or need to cancel or reschedule your appointment, please contact [facility] [place] [facility]  [phone] and follow the prompts.  Office hours are 8:00 a.m. to 4:30 p.m. Monday - Friday. Please note that voicemails left after 4:00 p.m. may not be returned until the following business day.  We are closed weekends and major holidays. You have access to a nurse at all times for urgent questions. Please call the main number to the clinic [phone] and follow the prompts.  For any non-urgent questions, you may also contact your provider using MyChart. We now offer e-Visits for anyone 18 and older to request care online for non-urgent symptoms. For details visit [email or web address].   Also download the MyChart app! Go to the app store, search "MyChart", open the app, select [facility], and log in with your MyChart username and password.  Due to Covid, a mask is required upon entering the hospital/clinic. If you do not have a mask, one will be given to you upon arrival. For doctor visits, patients may have 1 support person aged 18 or older with them. For treatment visits, patients cannot have anyone with them due to current Covid guidelines and our immunocompromised population.   Iron Sucrose injection What is this medication? IRON SUCROSE (AHY ern SOO krohs) is an iron complex. Iron is used to make healthy red blood cells, which carry oxygen and nutrients throughout the body. This medicine is used to treat iron deficiency anemia in people with chronickidney disease. This medicine may be used for other   purposes; ask your health care provider orpharmacist if you have questions. COMMON BRAND NAME(S): Venofer What should I tell my care team before I take this medication? They need to know if you have any of these conditions: anemia not caused by low iron levels heart disease high levels of  iron in the blood kidney disease liver disease an unusual or allergic reaction to iron, other medicines, foods, dyes, or preservatives pregnant or trying to get pregnant breast-feeding How should I use this medication? This medicine is for infusion into a vein. It is given by a health careprofessional in a hospital or clinic setting. Talk to your pediatrician regarding the use of this medicine in children. While this drug may be prescribed for children as young as 2 years for selectedconditions, precautions do apply. Overdosage: If you think you have taken too much of this medicine contact apoison control center or emergency room at once. NOTE: This medicine is only for you. Do not share this medicine with others. What if I miss a dose? It is important not to miss your dose. Call your doctor or health careprofessional if you are unable to keep an appointment. What may interact with this medication? Do not take this medicine with any of the following medications: deferoxamine dimercaprol other iron products This medicine may also interact with the following medications: chloramphenicol deferasirox This list may not describe all possible interactions. Give your health care provider a list of all the medicines, herbs, non-prescription drugs, or dietary supplements you use. Also tell them if you smoke, drink alcohol, or use illegaldrugs. Some items may interact with your medicine. What should I watch for while using this medication? Visit your doctor or healthcare professional regularly. Tell your doctor or healthcare professional if your symptoms do not start to get better or if theyget worse. You may need blood work done while you are taking this medicine. You may need to follow a special diet. Talk to your doctor. Foods that contain iron include: whole grains/cereals, dried fruits, beans, or peas, leafy greenvegetables, and organ meats (liver, kidney). What side effects may I notice from  receiving this medication? Side effects that you should report to your doctor or health care professionalas soon as possible: allergic reactions like skin rash, itching or hives, swelling of the face, lips, or tongue breathing problems changes in blood pressure cough fast, irregular heartbeat feeling faint or lightheaded, falls fever or chills flushing, sweating, or hot feelings joint or muscle aches/pains seizures swelling of the ankles or feet unusually weak or tired Side effects that usually do not require medical attention (report to yourdoctor or health care professional if they continue or are bothersome): diarrhea feeling achy headache irritation at site where injected nausea, vomiting stomach upset tiredness This list may not describe all possible side effects. Call your doctor for medical advice about side effects. You may report side effects to FDA at1-800-FDA-1088. Where should I keep my medication? This drug is given in a hospital or clinic and will not be stored at home. NOTE: This sheet is a summary. It may not cover all possible information. If you have questions about this medicine, talk to your doctor, pharmacist, orhealth care provider.  2022 Elsevier/Gold Standard (2011-06-28 17:14:35)  

## 2021-05-30 ENCOUNTER — Ambulatory Visit: Payer: Medicare Other

## 2021-06-20 ENCOUNTER — Ambulatory Visit (INDEPENDENT_AMBULATORY_CARE_PROVIDER_SITE_OTHER): Payer: Medicare Other

## 2021-06-20 ENCOUNTER — Other Ambulatory Visit: Payer: Self-pay

## 2021-06-20 ENCOUNTER — Ambulatory Visit (INDEPENDENT_AMBULATORY_CARE_PROVIDER_SITE_OTHER): Payer: Medicare Other | Admitting: Vascular Surgery

## 2021-06-20 VITALS — BP 170/73 | HR 73 | Ht 65.0 in | Wt 116.0 lb

## 2021-06-20 DIAGNOSIS — K559 Vascular disorder of intestine, unspecified: Secondary | ICD-10-CM

## 2021-06-20 DIAGNOSIS — I739 Peripheral vascular disease, unspecified: Secondary | ICD-10-CM | POA: Diagnosis not present

## 2021-06-20 DIAGNOSIS — E1169 Type 2 diabetes mellitus with other specified complication: Secondary | ICD-10-CM

## 2021-06-20 DIAGNOSIS — I639 Cerebral infarction, unspecified: Secondary | ICD-10-CM | POA: Diagnosis not present

## 2021-06-20 DIAGNOSIS — E785 Hyperlipidemia, unspecified: Secondary | ICD-10-CM

## 2021-06-20 DIAGNOSIS — I10 Essential (primary) hypertension: Secondary | ICD-10-CM

## 2021-06-20 NOTE — Assessment & Plan Note (Signed)
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  She is getting tube feeds due to severe esophagitis.  She is tolerating these well and not having weight loss.  With her otherwise debilitated state, I would not recommend any intervention for her mesenteric disease as it is unlikely to improve any symptoms at this point.

## 2021-06-20 NOTE — Assessment & Plan Note (Signed)
The patient has had a stroke and does have some degree of carotid disease but none that requires treatment currently.  Continue her current medical regimen.  Plan on checking in 1 year with duplex.

## 2021-06-20 NOTE — Assessment & Plan Note (Signed)
Her ABIs today are noncompressible on the right and 0.77 on the left which is roughly stable from previous studies that were noncompressible but had similar digital pressures.  No current limb threatening symptoms.  Recheck in 1 year.

## 2021-06-20 NOTE — Progress Notes (Signed)
MRN : [id number]  [name] is a 87 y.o. (23-Mar-1929) female who presents with chief complaint of  Chief Complaint  Patient presents with   Follow-up    1 yr [place]  .  History of Present Illness: Patient returns today in follow up of her mesenteric disease and her peripheral arterial disease.  Since her last visit, she has had a stroke which involved right hand weakness and numbness, speech difficulties, and some facial numbness.  She had a CT scan and MRI and has fairly extensive intracranial disease.  She has about a 60% right cervical internal carotid artery stenosis and left vertebral artery stenosis but does not appear to have high-grade left cervical internal carotid artery stenosis that would require intervention.  She has recovered somewhat but has remained limited.  She has been unable to eat due to severe esophagitis and has a feeding tube.  She is tolerating her feeds well without abdominal pain and has not lost weight.  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 She is also followed for PAD.  She walks slowly with a walker and does not have any disabling claudication symptoms.  No rest pain or ulceration.  ABIs today are noncompressible on the right and 0.77 on the left with similar digital pressures and indices from her previous study.  Current Outpatient Medications  Medication Sig Dispense Refill   acetaminophen (TYLENOL) 160 MG/5ML solution Place 20.3 mLs (650 mg total) into feeding tube every 6 (six) hours as needed for mild pain. 120 mL 0   allopurinol (ZYLOPRIM) 100 MG tablet Take by mouth.     Amino Acids-Protein Hydrolys (PRO-STAT) LIQD Take 8 oz via feeding tube 4 (four) times daily     ascorbic acid (VITAMIN C) 250 MG tablet Take 1 tablet by G tube 2 (two) times daily     aspirin 81 MG chewable tablet Place 1 tablet (81 mg total) into feeding tube daily. 30 tablet  0   Bromfenac Sodium 0.09 % SOLN Place 1 drop into the left eye 2 (two) times daily.     carvedilol (COREG) 3.125 MG tablet Place 1 tablet (3.125 mg total) into feeding tube 2 (two) times daily with a meal. 30 tablet 3   clopidogrel (PLAVIX) 75 MG tablet GIVE 1 TABLET VIA FEEDING TUBE EVERY [name] AS DIRECTED 30 tablet 2   clopidogrel (PLAVIX) 75 MG tablet Take 1 tablet (75 mg total) by G tube once daily     furosemide (LASIX) 20 MG tablet Place 0.5 tablets (10 mg total) into feeding tube daily. (Patient taking differently: Place 20 mg into feeding tube 2 (two) times daily.) 30 tablet 0   gentamicin ointment (GARAMYCIN) 0.1 % Apply topically.     glipiZIDE (GLUCOTROL XL) 2.5 MG 24 hr tablet Take 2.5 mg by mouth at bedtime.     glipiZIDE (GLUCOTROL) 5 MG tablet Take 5 mg by mouth in the morning.     hydrocerin (EUCERIN) CREA Apply 1 application topically 2 (two) times daily. To bilateral feet  0   Hypromellose 0.2 % SOLN Place 1 drop into both eyes 3 (three) times daily as needed (dry eyes).     lisinopril (ZESTRIL) 2.5 MG tablet Take 1 tablet (2.5 mg total) by mouth daily. 30 tablet 0   loperamide (IMODIUM) 1 MG/5ML solution Take 1 mg by mouth as needed for diarrhea or loose stools.  metFORMIN (GLUCOPHAGE) 500 MG tablet Place 1 tablet (500 mg total) into feeding tube 2 (two) times daily with a meal. 30 tablet 1   Multiple Vitamin (MULTIVITAMIN) LIQD Place 15 mLs into feeding tube daily. 450 mL 1   Nutritional Supplements (FEEDING SUPPLEMENT, KATE FARMS STANDARD 1.4,) LIQD liquid Place 325 mLs into feeding tube 4 (four) times daily. (Patient taking differently: Place 325 mLs into feeding tube 3 (three) times daily between meals.)     pantoprazole sodium (PROTONIX) 40 mg/20 mL PACK Place 20 mLs (40 mg total) into feeding tube daily. 600 mL 0   polyethylene glycol (MIRALAX / GLYCOLAX) 17 g packet Place 17 g into feeding tube daily as needed. 14 each 0   rOPINIRole (REQUIP) 2 MG tablet Place 1 tablet  (2 mg total) into feeding tube 2 (two) times daily. 60 tablet 0   Water For Irrigation, Sterile (FREE WATER) SOLN Place 100 mLs into feeding tube every 6 (six) hours.     No current facility-administered medications for this visit.    Past Medical History:  Diagnosis Date   Anemia    IRON INFUSIONS   Aortic valve sclerosis    Arrhythmia    Arthritis    osteoarthritis   Basal cell carcinoma of skin    Brain tumor (HCC)    Brain tumor ([place])    Cervical spine disease    CHF (congestive heart failure) (HCC)    Diabetes mellitus without complication (HCC)    Dysrhythmia    sinus arrhythmia   Esophageal stricture    severe, led to feeding tube placement in Sept 2019   Esophageal ulcer without bleeding    Gastrostomy tube dependent ([place])    DOES NOT EAT OR DRINK    GERD (gastroesophageal reflux disease)    Hearing loss    pt had hearing test  last month, left ear30% hearing due to stroke, 78% in right-normal for her age   History of kidney stones    Hyperlipemia    Hypertension    Kidney stones    Leaky heart valve    Meningioma (HCC)    Occlusive mesenteric ischemia (HCC)    Pulmonary hypertension (HCC)    RLS (restless legs syndrome)    Stroke ([place])    Vertigo     Past Surgical History:  Procedure Laterality Date   ABDOMINAL HYSTERECTOMY     APPENDECTOMY     ARTHROGRAM KNEE Left    BACK SURGERY     CERVIVAL NECK FUSION   CATARACT EXTRACTION W/PHACO Left 11/11/2018   Procedure: CATARACT EXTRACTION PHACO AND INTRAOCULAR LENS PLACEMENT ([place]) LEFT, DIABETIC;  Surgeon: Birder Robson, MD;  Location: [facility] ORS;  Service: Ophthalmology;  Laterality: Left;  [place]  00:41 CDE 6.41 Fluid pack lot # [id number]   COLONOSCOPY WITH PROPOFOL N/A 06/20/2017   Procedure: COLONOSCOPY WITH PROPOFOL;  Surgeon: Lollie Sails, MD;  Location[facility] ENDOSCOPY;  Service: Endoscopy;  Laterality: N/A;   ESOPHAGOGASTRODUODENOSCOPY (EGD) WITH PROPOFOL N/A 03/14/2015   Procedure:  ESOPHAGOGASTRODUODENOSCOPY (EGD) WITH PROPOFOL;  Surgeon: Josefine Class, MD;  Location: [facility] ENDOSCOPY;  Service: Endoscopy;  Laterality: N/A;   ESOPHAGOGASTRODUODENOSCOPY (EGD) WITH PROPOFOL N/A 03/28/2017   Procedure: ESOPHAGOGASTRODUODENOSCOPY (EGD) WITH PROPOFOL;  Surgeon: Lollie Sails, MD;  Location: [facility] ENDOSCOPY;  Service: Endoscopy;  Laterality: N/A;   ESOPHAGOGASTRODUODENOSCOPY (EGD) WITH PROPOFOL N/A 06/20/2017   Procedure: ESOPHAGOGASTRODUODENOSCOPY (EGD) WITH PROPOFOL;  Surgeon: Lollie Sails, MD;  Location: [facility] ENDOSCOPY;  Service: Endoscopy;  Laterality: N/A;  ESOPHAGOGASTRODUODENOSCOPY (EGD) WITH PROPOFOL N/A 10/21/2017   Procedure: ESOPHAGOGASTRODUODENOSCOPY (EGD) WITH PROPOFOL;  Surgeon: Lollie Sails, MD;  Location: [facility] ENDOSCOPY;  Service: Endoscopy;  Laterality: N/A;   ESOPHAGOGASTRODUODENOSCOPY (EGD) WITH PROPOFOL N/A 04/15/2018   Procedure: ESOPHAGOGASTRODUODENOSCOPY (EGD) WITH PROPOFOL;  Surgeon: Lollie Sails, MD;  Location: [facility] ENDOSCOPY;  Service: Endoscopy;  Laterality: N/A;   ESOPHAGUS SURGERY     CLOSURE   EYE SURGERY     HYSTERECTOMY ABDOMINAL WITH SALPINGECTOMY     IR GASTROSTOMY TUBE MOD SED  06/11/2018   IR GASTROSTOMY TUBE REMOVAL  03/25/2019   IR REPLACE G-TUBE SIMPLE WO FLUORO  10/19/2020   PERIPHERAL VASCULAR CATHETERIZATION N/A 01/23/2016   Procedure: Visceral Venography;  Surgeon: Algernon Huxley, MD;  Location: [place] CV LAB;  Service: Cardiovascular;  Laterality: N/A;   PERIPHERAL VASCULAR CATHETERIZATION  01/23/2016   Procedure: Peripheral Vascular Intervention;  Surgeon: Algernon Huxley, MD;  Location: [place] CV LAB;  Service: Cardiovascular;;   UPPER ESOPHAGEAL ENDOSCOPIC ULTRASOUND (EUS) N/A 12/05/2017   Procedure: UPPER ESOPHAGEAL ENDOSCOPIC ULTRASOUND (EUS);  Surgeon: Jola Schmidt, MD;  Location: [facility] ENDOSCOPY;  Service: Endoscopy;  Laterality: N/A;   VISCERAL ANGIOGRAPHY N/A 03/18/2017   Procedure: Visceral Angiography;   Surgeon: Algernon Huxley, MD;  Location: [place] CV LAB;  Service: Cardiovascular;  Laterality: N/A;   VISCERAL ARTERY INTERVENTION N/A 03/18/2017   Procedure: Visceral Artery Intervention;  Surgeon: Algernon Huxley, MD;  Location: [place] CV LAB;  Service: Cardiovascular;  Laterality: N/A;     Social History   Tobacco Use   Smoking status: Never   Smokeless tobacco: Never  Vaping Use   Vaping Use: Never used  Substance Use Topics   Alcohol use: Never   Drug use: Never       Family History  Problem Relation Age of Onset   Stroke Mother    Hypertension Mother    Heart disease Mother    Cancer Father    Heart disease Father    Heart attack Sister    Diabetes Sister    Heart attack Brother    Thyroid cancer Daughter    Cancer - Colon Daughter      Allergies  Allergen Reactions   Elemental Sulfur Diarrhea and Nausea And Vomiting   Gabapentin Swelling   Lipitor [Atorvastatin] Other (See Comments)    Muscle aches   Mevacor [Lovastatin] Other (See Comments)    Muscle aches   Milk-Related Compounds Other (See Comments)    Large quantities cause headaches    Septra [Sulfamethoxazole-Trimethoprim] Other (See Comments)    Unknown   Statins Other (See Comments)    Muscle pain   Zocor [Simvastatin] Other (See Comments)    Muscle aches     REVIEW OF SYSTEMS (Negative unless checked)  Constitutional: [] Weight loss  [] Fever  [] Chills Cardiac: [] Chest pain   [] Chest pressure   [] Palpitations   [] Shortness of breath when laying flat   [] Shortness of breath at rest   [] Shortness of breath with exertion. Vascular:  [] Pain in legs with walking   [] Pain in legs at rest   [] Pain in legs when laying flat   [] Claudication   [] Pain in feet when walking  [] Pain in feet at rest  [] Pain in feet when laying flat   [] History of DVT   [] Phlebitis   [] Swelling in legs   [] Varicose veins   [] Non-healing ulcers Pulmonary:   [] Uses home oxygen   [] Productive cough   [] Hemoptysis    []   Wheeze  [] COPD   [] Asthma Neurologic:  [] Dizziness  [] Blackouts   [] Seizures   [x] History of stroke   [] History of TIA  [x] Aphasia   [] Temporary blindness   [x] Dysphagia   [] Weakness or numbness in arms   [] Weakness or numbness in legs Musculoskeletal:  [x] Arthritis   [] Joint swelling   [x] Joint pain   [] Low back pain Hematologic:  [] Easy bruising  [] Easy bleeding   [] Hypercoagulable state   [] Anemic   Gastrointestinal:  [] Blood in stool   [] Vomiting blood  [x] Gastroesophageal reflux/heartburn   [x] Abdominal pain Genitourinary:  [] Chronic kidney disease   [] Difficult urination  [] Frequent urination  [] Burning with urination   [] Hematuria Skin:  [] Rashes   [] Ulcers   [] Wounds Psychological:  [] History of anxiety   []  History of major depression.  Physical Examination  BP (!) 170/73   Pulse 73   Ht 5\' 5"  (1.651 m)   Wt 116 lb (52.6 kg)   BMI 19.30 kg/m  Gen:  WD/WN, NAD.  Somewhat frail-appearing Head: [place]/AT, No temporalis wasting. Ear/Nose/Throat: Hearing grossly intact, nares w/o erythema or drainage Eyes: Conjunctiva clear. Sclera non-icteric Neck: Supple.  Trachea midline Pulmonary:  Good air movement, no use of accessory muscles.  Cardiac: RRR, no JVD Vascular:  Vessel Right Left  Radial 2+ palpable 1+ palpable                          PT 1+ palpable 1+ palpable  DP 1+ palpable 1+ palpable   Gastrointestinal: soft, non-tender/non-distended. No guarding/reflex.  Feeding tube in place Musculoskeletal: M/S 5/5 throughout.  No deformity or atrophy.  Walks with a walker.  Minimal lower extremity edema. Neurologic: Sensation grossly intact in extremities.  Symmetrical.  Speech is fluent.  Psychiatric: Judgment intact, Mood & affect appropriate for pt's clinical situation. Dermatologic: No rashes or ulcers noted.  No cellulitis or open wounds.      Labs Recent Results (from the past 2160 hour(s))  Iron and TIBC     Status: None   Collection Time: 04/19/21  1:44 PM   Result Value Ref Range   Iron 37 28 - 170 ug/dL   TIBC 325 250 - 450 ug/dL   Saturation Ratios 11 10.4 - 31.8 %   UIBC 288 ug/dL    Comment: Performed at [facility], [address] [place], [place] [address]  Copper, serum     Status: None   Collection Time: 04/19/21  1:44 PM  Result Value Ref Range   Copper 82 80 - 158 ug/dL    Comment: (NOTE) This test was developed and its performance characteristics determined by [facility]. It has not been cleared or approved by the Food and Drug Administration.                                Detection Limit = 5 Performed At: [facility] [place], [place] [id number] Rush Farmer MD [id number]   Ferritin     Status: None   Collection Time: 04/19/21  1:44 PM  Result Value Ref Range   Ferritin 209 11 - 307 ng/mL    Comment: Performed at [facility], [address] [place], [place] [address]  Vitamin B12     Status: None   Collection Time: 04/19/21  1:44 PM  Result Value Ref Range   Vitamin B-12 566 180 - 914 pg/mL    Comment: (NOTE)  This assay is not validated for testing neonatal or myeloproliferative syndrome specimens for Vitamin B12 levels. Performed at [facility], [place] [address], [place], [place] [address]   Comprehensive metabolic panel     Status: Abnormal   Collection Time: 04/19/21  1:44 PM  Result Value Ref Range   Sodium 135 135 - 145 mmol/L   Potassium 4.5 3.5 - 5.1 mmol/L   Chloride 98 98 - 111 mmol/L   CO2 27 22 - 32 mmol/L   Glucose, Bld 184 (H) 70 - 99 mg/dL    Comment: Glucose reference range applies only to samples taken after fasting for at least 8 hours.   BUN 49 (H) 8 - 23 mg/dL   Creatinine, Ser 1.09 (H) 0.44 - 1.00 mg/dL   Calcium 9.0 8.9 - 10.3 mg/dL   Total Protein 6.7 6.5 - 8.1 g/dL   Albumin 3.6 3.5 - 5.0 g/dL   AST 22 15 - 41 U/L   ALT 15 0 - 44 U/L   Alkaline Phosphatase 45 38 - 126 U/L   Total Bilirubin 0.4 0.3 - 1.2 mg/dL   GFR, Estimated  49 (L) >60 mL/min    Comment: (NOTE) Calculated using the CKD-EPI Creatinine Equation (2021)    Anion gap 10 5 - 15    Comment: Performed at [facility], [address] [place], [place] [address]  CBC with Differential/Platelet     Status: Abnormal   Collection Time: 04/19/21  1:44 PM  Result Value Ref Range   WBC 7.6 4.0 - 10.5 K/uL   RBC 3.94 3.87 - 5.11 MIL/uL   Hemoglobin 11.0 (L) 12.0 - 15.0 g/dL   HCT 35.3 (L) 36.0 - 46.0 %   MCV 89.6 80.0 - 100.0 fL   MCH 27.9 26.0 - 34.0 pg   MCHC 31.2 30.0 - 36.0 g/dL   RDW 17.7 (H) 11.5 - 15.5 %   Platelets 311 150 - 400 K/uL   nRBC 0.0 0.0 - 0.2 %   Neutrophils Relative % 62 %   Neutro Abs 4.7 1.7 - 7.7 K/uL   Lymphocytes Relative 20 %   Lymphs Abs 1.5 0.7 - 4.0 K/uL   Monocytes Relative 6 %   Monocytes Absolute 0.4 0.1 - 1.0 K/uL   Eosinophils Relative 11 %   Eosinophils Absolute 0.8 (H) 0.0 - 0.5 K/uL   Basophils Relative 1 %   Basophils Absolute 0.1 0.0 - 0.1 K/uL   Immature Granulocytes 0 %   Abs Immature Granulocytes 0.03 0.00 - 0.07 K/uL    Comment: Performed at [facility], [address] [place], [place] [address]  Comprehensive metabolic panel     Status: Abnormal   Collection Time: 05/16/21 12:44 PM  Result Value Ref Range   Sodium 136 135 - 145 mmol/L   Potassium 4.5 3.5 - 5.1 mmol/L   Chloride 97 (L) 98 - 111 mmol/L   CO2 31 22 - 32 mmol/L   Glucose, Bld 124 (H) 70 - 99 mg/dL    Comment: Glucose reference range applies only to samples taken after fasting for at least 8 hours.   BUN 47 (H) 8 - 23 mg/dL   Creatinine, Ser 1.15 (H) 0.44 - 1.00 mg/dL   Calcium 8.8 (L) 8.9 - 10.3 mg/dL   Total Protein 6.6 6.5 - 8.1 g/dL   Albumin 3.7 3.5 - 5.0 g/dL   AST 20 15 - 41 U/L   ALT 14 0 - 44 U/L  Alkaline Phosphatase 41 38 - 126 U/L   Total Bilirubin 0.6 0.3 - 1.2 mg/dL   GFR, Estimated 46 (L) >60 mL/min    Comment: (NOTE) Calculated using the CKD-EPI Creatinine Equation (2021)    Anion gap 8 5 - 15     Comment: Performed at [facility], [address] [place], [place] [address]  Vitamin B12     Status: None   Collection Time: 05/16/21 12:44 PM  Result Value Ref Range   Vitamin B-12 649 180 - 914 pg/mL    Comment: (NOTE) This assay is not validated for testing neonatal or myeloproliferative syndrome specimens for Vitamin B12 levels. Performed at [facility], [place] [address], [place], [place] [address]   Iron and TIBC     Status: None   Collection Time: 05/16/21 12:44 PM  Result Value Ref Range   Iron 49 28 - 170 ug/dL   TIBC 374 250 - 450 ug/dL   Saturation Ratios 13 10.4 - 31.8 %   UIBC 325 ug/dL    Comment: Performed at [facility], [address] [place], [place] [address]  CBC with Differential     Status: Abnormal   Collection Time: 05/16/21 12:44 PM  Result Value Ref Range   WBC 7.8 4.0 - 10.5 K/uL   RBC 3.06 (L) 3.87 - 5.11 MIL/uL   Hemoglobin 9.0 (L) 12.0 - 15.0 g/dL   HCT 28.8 (L) 36.0 - 46.0 %   MCV 94.1 80.0 - 100.0 fL   MCH 29.4 26.0 - 34.0 pg   MCHC 31.3 30.0 - 36.0 g/dL   RDW 18.2 (H) 11.5 - 15.5 %   Platelets 325 150 - 400 K/uL   nRBC 0.0 0.0 - 0.2 %   Neutrophils Relative % 69 %   Neutro Abs 5.4 1.7 - 7.7 K/uL   Lymphocytes Relative 16 %   Lymphs Abs 1.2 0.7 - 4.0 K/uL   Monocytes Relative 7 %   Monocytes Absolute 0.5 0.1 - 1.0 K/uL   Eosinophils Relative 7 %   Eosinophils Absolute 0.6 (H) 0.0 - 0.5 K/uL   Basophils Relative 1 %   Basophils Absolute 0.1 0.0 - 0.1 K/uL   Immature Granulocytes 0 %   Abs Immature Granulocytes 0.03 0.00 - 0.07 K/uL    Comment: Performed at [facility], [address] [place], [place] [address]  Ferritin     Status: None   Collection Time: 05/16/21 12:44 PM  Result Value Ref Range   Ferritin 67 11 - 307 ng/mL    Comment: Performed at [facility], [address] [place], [place] [address]  Copper, serum     Status: None   Collection Time: 05/16/21 12:44 PM  Result Value  Ref Range   Copper 84 80 - 158 ug/dL    Comment: (NOTE) This test was developed and its performance characteristics determined by [facility]. It has not been cleared or approved by the Food and Drug Administration.                                Detection Limit = 5 Performed At: [facility] [place], [place] [id number] Rush Farmer MD [id number]     Radiology No results found.  Assessment/Plan Hyperlipemia lipid control important in reducing the progression of atherosclerotic disease. Continue statin therapy     Type 2 diabetes mellitus with complication (HCC) blood glucose  control important in reducing the progression of atherosclerotic disease. Also, involved in wound healing. On appropriate medications.     Hypertension blood pressure control important in reducing the progression of atherosclerotic disease. On appropriate oral medications.     Esophagitis, CMV (HCC) Has had to have repeated dilatations.  The stricture from this seems to be causing more of an issue that malperfusion, but certainly her visceral disease could be a contributing factor.  Has led to an esophageal stricture now requiring a feeding tube  Mesenteric ischemia ([place]) Her duplex today does show elevated velocities in both the celiac artery and superior mesenteric artery consistent with greater than 60% stenosis.  She is getting tube feeds due to severe esophagitis.  She is tolerating these well and not having weight loss.  With her otherwise debilitated state, I would not recommend any intervention for her mesenteric disease as it is unlikely to improve any symptoms at this point.  PAD (peripheral artery disease) (HCC) Her ABIs today are noncompressible on the right and 0.77 on the left which is roughly stable from previous studies that were noncompressible but had similar digital pressures.  No current limb threatening symptoms.  Recheck in 1 year.  Stroke (cerebrum) [facility]) The  patient has had a stroke and does have some degree of carotid disease but none that requires treatment currently.  Continue her current medical regimen.  Plan on checking in 1 year with duplex.    [name], MD  06/20/2021 9:59 AM    This note was created with Dragon medical transcription system.  Any errors from dictation are purely unintentional

## 2021-07-13 ENCOUNTER — Other Ambulatory Visit: Payer: Self-pay

## 2021-07-13 DIAGNOSIS — D509 Iron deficiency anemia, unspecified: Secondary | ICD-10-CM

## 2021-07-17 ENCOUNTER — Other Ambulatory Visit: Payer: Self-pay

## 2021-07-17 ENCOUNTER — Inpatient Hospital Stay: Payer: Medicare Other | Attending: Oncology

## 2021-07-17 DIAGNOSIS — D509 Iron deficiency anemia, unspecified: Secondary | ICD-10-CM | POA: Insufficient documentation

## 2021-07-17 LAB — CBC
HCT: 28.9 % — ABNORMAL LOW (ref 36.0–46.0)
Hemoglobin: 9.5 g/dL — ABNORMAL LOW (ref 12.0–15.0)
MCH: 30.4 pg (ref 26.0–34.0)
MCHC: 32.9 g/dL (ref 30.0–36.0)
MCV: 92.6 fL (ref 80.0–100.0)
Platelets: 331 10*3/uL (ref 150–400)
RBC: 3.12 MIL/uL — ABNORMAL LOW (ref 3.87–5.11)
RDW: 13.7 % (ref 11.5–15.5)
WBC: 6.5 10*3/uL (ref 4.0–10.5)
nRBC: 0 % (ref 0.0–0.2)

## 2021-07-17 LAB — IRON AND TIBC
Iron: 37 ug/dL (ref 28–170)
Saturation Ratios: 11 % (ref 10.4–31.8)
TIBC: 335 ug/dL (ref 250–450)
UIBC: 298 ug/dL

## 2021-07-17 LAB — FERRITIN: Ferritin: 136 ng/mL (ref 11–307)

## 2021-07-24 ENCOUNTER — Other Ambulatory Visit: Payer: Self-pay | Admitting: Neurology

## 2021-07-24 ENCOUNTER — Ambulatory Visit
Admission: RE | Admit: 2021-07-24 | Discharge: 2021-07-24 | Disposition: A | Discharge status: Home / Self Care | Payer: Medicare Other | Source: Ambulatory Visit | Attending: Neurology | Admitting: Neurology

## 2021-07-24 ENCOUNTER — Other Ambulatory Visit: Payer: Self-pay

## 2021-07-24 ENCOUNTER — Other Ambulatory Visit (HOSPITAL_COMMUNITY): Payer: Self-pay | Admitting: Neurology

## 2021-07-24 DIAGNOSIS — M7989 Other specified soft tissue disorders: Secondary | ICD-10-CM | POA: Insufficient documentation

## 2021-07-24 DIAGNOSIS — M79605 Pain in left leg: Secondary | ICD-10-CM | POA: Insufficient documentation

## 2021-07-25 ENCOUNTER — Emergency Department: Payer: Medicare Other

## 2021-07-25 ENCOUNTER — Other Ambulatory Visit: Payer: Self-pay

## 2021-07-25 ENCOUNTER — Other Ambulatory Visit: Payer: Self-pay | Admitting: Neurology

## 2021-07-25 ENCOUNTER — Inpatient Hospital Stay
Admission: EM | Admit: 2021-07-25 | Discharge: 2021-07-28 | DRG: 291 | Disposition: A | Discharge status: Home / Self Care | Payer: Medicare Other | Attending: Internal Medicine | Admitting: Internal Medicine

## 2021-07-25 ENCOUNTER — Other Ambulatory Visit (HOSPITAL_BASED_OUTPATIENT_CLINIC_OR_DEPARTMENT_OTHER): Payer: Self-pay | Admitting: Neurology

## 2021-07-25 ENCOUNTER — Encounter: Payer: Self-pay | Admitting: Emergency Medicine

## 2021-07-25 DIAGNOSIS — Z931 Gastrostomy status: Secondary | ICD-10-CM

## 2021-07-25 DIAGNOSIS — Z8249 Family history of ischemic heart disease and other diseases of the circulatory system: Secondary | ICD-10-CM

## 2021-07-25 DIAGNOSIS — R079 Chest pain, unspecified: Secondary | ICD-10-CM

## 2021-07-25 DIAGNOSIS — M7989 Other specified soft tissue disorders: Secondary | ICD-10-CM

## 2021-07-25 DIAGNOSIS — R0600 Dyspnea, unspecified: Secondary | ICD-10-CM

## 2021-07-25 DIAGNOSIS — I5043 Acute on chronic combined systolic (congestive) and diastolic (congestive) heart failure: Secondary | ICD-10-CM | POA: Diagnosis present

## 2021-07-25 DIAGNOSIS — I350 Nonrheumatic aortic (valve) stenosis: Secondary | ICD-10-CM | POA: Diagnosis present

## 2021-07-25 DIAGNOSIS — Z20822 Contact with and (suspected) exposure to covid-19: Secondary | ICD-10-CM | POA: Diagnosis present

## 2021-07-25 DIAGNOSIS — E1151 Type 2 diabetes mellitus with diabetic peripheral angiopathy without gangrene: Secondary | ICD-10-CM | POA: Diagnosis present

## 2021-07-25 DIAGNOSIS — Z66 Do not resuscitate: Secondary | ICD-10-CM | POA: Diagnosis present

## 2021-07-25 DIAGNOSIS — Z808 Family history of malignant neoplasm of other organs or systems: Secondary | ICD-10-CM

## 2021-07-25 DIAGNOSIS — Z79899 Other long term (current) drug therapy: Secondary | ICD-10-CM

## 2021-07-25 DIAGNOSIS — Z981 Arthrodesis status: Secondary | ICD-10-CM

## 2021-07-25 DIAGNOSIS — I272 Pulmonary hypertension, unspecified: Secondary | ICD-10-CM | POA: Diagnosis present

## 2021-07-25 DIAGNOSIS — E1165 Type 2 diabetes mellitus with hyperglycemia: Secondary | ICD-10-CM

## 2021-07-25 DIAGNOSIS — Z833 Family history of diabetes mellitus: Secondary | ICD-10-CM

## 2021-07-25 DIAGNOSIS — I10 Essential (primary) hypertension: Secondary | ICD-10-CM | POA: Diagnosis present

## 2021-07-25 DIAGNOSIS — I214 Non-ST elevation (NSTEMI) myocardial infarction: Principal | ICD-10-CM

## 2021-07-25 DIAGNOSIS — Z91011 Allergy to milk products: Secondary | ICD-10-CM

## 2021-07-25 DIAGNOSIS — L819 Disorder of pigmentation, unspecified: Secondary | ICD-10-CM

## 2021-07-25 DIAGNOSIS — G2581 Restless legs syndrome: Secondary | ICD-10-CM | POA: Diagnosis present

## 2021-07-25 DIAGNOSIS — K219 Gastro-esophageal reflux disease without esophagitis: Secondary | ICD-10-CM | POA: Diagnosis present

## 2021-07-25 DIAGNOSIS — K222 Esophageal obstruction: Secondary | ICD-10-CM | POA: Diagnosis present

## 2021-07-25 DIAGNOSIS — E1122 Type 2 diabetes mellitus with diabetic chronic kidney disease: Secondary | ICD-10-CM | POA: Diagnosis present

## 2021-07-25 DIAGNOSIS — E785 Hyperlipidemia, unspecified: Secondary | ICD-10-CM | POA: Diagnosis present

## 2021-07-25 DIAGNOSIS — D649 Anemia, unspecified: Secondary | ICD-10-CM

## 2021-07-25 DIAGNOSIS — Z8673 Personal history of transient ischemic attack (TIA), and cerebral infarction without residual deficits: Secondary | ICD-10-CM

## 2021-07-25 DIAGNOSIS — M79605 Pain in left leg: Secondary | ICD-10-CM

## 2021-07-25 DIAGNOSIS — Z888 Allergy status to other drugs, medicaments and biological substances status: Secondary | ICD-10-CM

## 2021-07-25 DIAGNOSIS — N1831 Chronic kidney disease, stage 3a: Secondary | ICD-10-CM | POA: Diagnosis present

## 2021-07-25 DIAGNOSIS — I248 Other forms of acute ischemic heart disease: Secondary | ICD-10-CM | POA: Diagnosis present

## 2021-07-25 DIAGNOSIS — E1169 Type 2 diabetes mellitus with other specified complication: Secondary | ICD-10-CM | POA: Diagnosis present

## 2021-07-25 DIAGNOSIS — Z823 Family history of stroke: Secondary | ICD-10-CM

## 2021-07-25 DIAGNOSIS — I130 Hypertensive heart and chronic kidney disease with heart failure and stage 1 through stage 4 chronic kidney disease, or unspecified chronic kidney disease: Principal | ICD-10-CM | POA: Diagnosis present

## 2021-07-25 DIAGNOSIS — M1990 Unspecified osteoarthritis, unspecified site: Secondary | ICD-10-CM | POA: Diagnosis present

## 2021-07-25 DIAGNOSIS — D509 Iron deficiency anemia, unspecified: Secondary | ICD-10-CM | POA: Diagnosis not present

## 2021-07-25 DIAGNOSIS — Z7984 Long term (current) use of oral hypoglycemic drugs: Secondary | ICD-10-CM

## 2021-07-25 DIAGNOSIS — Z7982 Long term (current) use of aspirin: Secondary | ICD-10-CM

## 2021-07-25 DIAGNOSIS — I69351 Hemiplegia and hemiparesis following cerebral infarction affecting right dominant side: Secondary | ICD-10-CM

## 2021-07-25 DIAGNOSIS — D631 Anemia in chronic kidney disease: Secondary | ICD-10-CM | POA: Diagnosis present

## 2021-07-25 DIAGNOSIS — Z7902 Long term (current) use of antithrombotics/antiplatelets: Secondary | ICD-10-CM

## 2021-07-25 DIAGNOSIS — I509 Heart failure, unspecified: Secondary | ICD-10-CM

## 2021-07-25 DIAGNOSIS — H9190 Unspecified hearing loss, unspecified ear: Secondary | ICD-10-CM | POA: Diagnosis present

## 2021-07-25 DIAGNOSIS — Z882 Allergy status to sulfonamides status: Secondary | ICD-10-CM

## 2021-07-25 DIAGNOSIS — Z85828 Personal history of other malignant neoplasm of skin: Secondary | ICD-10-CM

## 2021-07-25 DIAGNOSIS — R531 Weakness: Secondary | ICD-10-CM

## 2021-07-25 LAB — BASIC METABOLIC PANEL
Anion gap: 7 (ref 5–15)
BUN: 56 mg/dL — ABNORMAL HIGH (ref 8–23)
CO2: 28 mmol/L (ref 22–32)
Calcium: 8.8 mg/dL — ABNORMAL LOW (ref 8.9–10.3)
Chloride: 97 mmol/L — ABNORMAL LOW (ref 98–111)
Creatinine, Ser: 1.24 mg/dL — ABNORMAL HIGH (ref 0.44–1.00)
GFR, Estimated: 42 mL/min — ABNORMAL LOW (ref >60)
Glucose, Bld: 224 mg/dL — ABNORMAL HIGH (ref 70–99)
Potassium: 4.9 mmol/L (ref 3.5–5.1)
Sodium: 132 mmol/L — ABNORMAL LOW (ref 135–145)

## 2021-07-25 LAB — CBC
HCT: 25.1 % — ABNORMAL LOW (ref 36.0–46.0)
Hemoglobin: 8.3 g/dL — ABNORMAL LOW (ref 12.0–15.0)
MCH: 31.4 pg (ref 26.0–34.0)
MCHC: 33.1 g/dL (ref 30.0–36.0)
MCV: 95.1 fL (ref 80.0–100.0)
Platelets: 305 10*3/uL (ref 150–400)
RBC: 2.64 MIL/uL — ABNORMAL LOW (ref 3.87–5.11)
RDW: 14.5 % (ref 11.5–15.5)
WBC: 6.7 10*3/uL (ref 4.0–10.5)
nRBC: 0 % (ref 0.0–0.2)

## 2021-07-25 LAB — TROPONIN I (HIGH SENSITIVITY): Troponin I (High Sensitivity): 186 ng/L (ref <18)

## 2021-07-25 NOTE — ED Triage Notes (Signed)
EMS brings pt in from home for c/o [facility]; hx CHF and CVA

## 2021-07-25 NOTE — ED Notes (Signed)
Lab reports troponin 186; acuity level changed; pt taken to room 15 via w/c; assisted into hosp gown & on card monitor

## 2021-07-25 NOTE — ED Triage Notes (Signed)
Pt to ED via EMS from home c/o SOB that started this evening, went to bed around 2030 and woke up around 2200 with SOB and worse with exertion.  States had pain all over when she woke up but denies pain now, denies cough.  States had tylenol and lyrica today, lyrica was started today.  Hx CHF and CVA with speech and right arm deficits.  Pt A&Ox4, chest rise even and unlabored while sitting, and in NAD at this time.

## 2021-07-26 ENCOUNTER — Inpatient Hospital Stay (HOSPITAL_COMMUNITY)
Admit: 2021-07-26 | Discharge: 2021-07-26 | Disposition: A | Discharge status: Home / Self Care | Payer: Medicare Other | Attending: Internal Medicine | Admitting: Internal Medicine

## 2021-07-26 DIAGNOSIS — I10 Essential (primary) hypertension: Secondary | ICD-10-CM | POA: Diagnosis not present

## 2021-07-26 DIAGNOSIS — G2581 Restless legs syndrome: Secondary | ICD-10-CM | POA: Diagnosis present

## 2021-07-26 DIAGNOSIS — I5043 Acute on chronic combined systolic (congestive) and diastolic (congestive) heart failure: Secondary | ICD-10-CM

## 2021-07-26 DIAGNOSIS — I214 Non-ST elevation (NSTEMI) myocardial infarction: Secondary | ICD-10-CM | POA: Diagnosis present

## 2021-07-26 DIAGNOSIS — Z8673 Personal history of transient ischemic attack (TIA), and cerebral infarction without residual deficits: Secondary | ICD-10-CM | POA: Diagnosis not present

## 2021-07-26 DIAGNOSIS — Z7902 Long term (current) use of antithrombotics/antiplatelets: Secondary | ICD-10-CM | POA: Diagnosis not present

## 2021-07-26 DIAGNOSIS — Z20822 Contact with and (suspected) exposure to covid-19: Secondary | ICD-10-CM | POA: Diagnosis present

## 2021-07-26 DIAGNOSIS — I69351 Hemiplegia and hemiparesis following cerebral infarction affecting right dominant side: Secondary | ICD-10-CM | POA: Diagnosis not present

## 2021-07-26 DIAGNOSIS — D649 Anemia, unspecified: Secondary | ICD-10-CM

## 2021-07-26 DIAGNOSIS — I5031 Acute diastolic (congestive) heart failure: Secondary | ICD-10-CM

## 2021-07-26 DIAGNOSIS — H9190 Unspecified hearing loss, unspecified ear: Secondary | ICD-10-CM | POA: Diagnosis present

## 2021-07-26 DIAGNOSIS — N1831 Chronic kidney disease, stage 3a: Secondary | ICD-10-CM | POA: Diagnosis present

## 2021-07-26 DIAGNOSIS — Z66 Do not resuscitate: Secondary | ICD-10-CM | POA: Diagnosis present

## 2021-07-26 DIAGNOSIS — E1122 Type 2 diabetes mellitus with diabetic chronic kidney disease: Secondary | ICD-10-CM | POA: Diagnosis present

## 2021-07-26 DIAGNOSIS — Z931 Gastrostomy status: Secondary | ICD-10-CM

## 2021-07-26 DIAGNOSIS — Z808 Family history of malignant neoplasm of other organs or systems: Secondary | ICD-10-CM | POA: Diagnosis not present

## 2021-07-26 DIAGNOSIS — R0602 Shortness of breath: Secondary | ICD-10-CM | POA: Diagnosis present

## 2021-07-26 DIAGNOSIS — I272 Pulmonary hypertension, unspecified: Secondary | ICD-10-CM | POA: Diagnosis present

## 2021-07-26 DIAGNOSIS — K222 Esophageal obstruction: Secondary | ICD-10-CM

## 2021-07-26 DIAGNOSIS — I130 Hypertensive heart and chronic kidney disease with heart failure and stage 1 through stage 4 chronic kidney disease, or unspecified chronic kidney disease: Secondary | ICD-10-CM | POA: Diagnosis present

## 2021-07-26 DIAGNOSIS — E785 Hyperlipidemia, unspecified: Secondary | ICD-10-CM | POA: Diagnosis present

## 2021-07-26 DIAGNOSIS — K219 Gastro-esophageal reflux disease without esophagitis: Secondary | ICD-10-CM | POA: Diagnosis present

## 2021-07-26 DIAGNOSIS — E1151 Type 2 diabetes mellitus with diabetic peripheral angiopathy without gangrene: Secondary | ICD-10-CM | POA: Diagnosis present

## 2021-07-26 DIAGNOSIS — I350 Nonrheumatic aortic (valve) stenosis: Secondary | ICD-10-CM | POA: Diagnosis present

## 2021-07-26 DIAGNOSIS — M1990 Unspecified osteoarthritis, unspecified site: Secondary | ICD-10-CM | POA: Diagnosis present

## 2021-07-26 DIAGNOSIS — Z7982 Long term (current) use of aspirin: Secondary | ICD-10-CM | POA: Diagnosis not present

## 2021-07-26 DIAGNOSIS — D631 Anemia in chronic kidney disease: Secondary | ICD-10-CM | POA: Diagnosis present

## 2021-07-26 DIAGNOSIS — Z981 Arthrodesis status: Secondary | ICD-10-CM | POA: Diagnosis not present

## 2021-07-26 DIAGNOSIS — E1169 Type 2 diabetes mellitus with other specified complication: Secondary | ICD-10-CM | POA: Diagnosis present

## 2021-07-26 LAB — ECHOCARDIOGRAM COMPLETE
AR max vel: 0.53 cm2
AV Area VTI: 0.47 cm2
AV Area mean vel: 0.49 cm2
AV Mean grad: 15.5 mmHg
AV Peak grad: 25.6 mmHg
Ao pk vel: 2.53 m/s
Area-P 1/2: 3.39 cm2
Calc EF: 39.2 %
Height: 65 in
MV VTI: 0.79 cm2
P 1/2 time: 350 msec
S' Lateral: 4.7 cm
Single Plane A2C EF: 30.6 %
Single Plane A4C EF: 46 %
Weight: 1792 oz

## 2021-07-26 LAB — CBG MONITORING, ED
Glucose-Capillary: 95 mg/dL (ref 70–99)
Glucose-Capillary: 111 mg/dL — ABNORMAL HIGH (ref 70–99)
Glucose-Capillary: 125 mg/dL — ABNORMAL HIGH (ref 70–99)
Glucose-Capillary: 232 mg/dL — ABNORMAL HIGH (ref 70–99)

## 2021-07-26 LAB — PROTIME-INR
INR: 1 (ref 0.8–1.2)
Prothrombin Time: 12.8 seconds (ref 11.4–15.2)

## 2021-07-26 LAB — HEPARIN LEVEL (UNFRACTIONATED)
Heparin Unfractionated: 0.27 IU/mL — ABNORMAL LOW (ref 0.30–0.70)
Heparin Unfractionated: 0.27 IU/mL — ABNORMAL LOW (ref 0.30–0.70)

## 2021-07-26 LAB — APTT: aPTT: 27 seconds (ref 24–36)

## 2021-07-26 LAB — RESP PANEL BY RT-PCR (FLU A&B, COVID) ARPGX2
Influenza A by PCR: NEGATIVE
Influenza B by PCR: NEGATIVE
SARS Coronavirus 2 by RT PCR: NEGATIVE

## 2021-07-26 LAB — BRAIN NATRIURETIC PEPTIDE: B Natriuretic Peptide: 1541 pg/mL — ABNORMAL HIGH (ref 0.0–100.0)

## 2021-07-26 LAB — HEMOGLOBIN A1C
Hgb A1c MFr Bld: 5.3 % (ref 4.8–5.6)
Mean Plasma Glucose: 105.41 mg/dL

## 2021-07-26 LAB — TROPONIN I (HIGH SENSITIVITY): Troponin I (High Sensitivity): 225 ng/L (ref <18)

## 2021-07-26 MED ORDER — ALBUTEROL SULFATE (2.5 MG/3ML) 0.083% IN NEBU
2.5000 mg | INHALATION_SOLUTION | RESPIRATORY_TRACT | Status: DC | PRN
  Administered 2021-07-26 – 2021-07-27 (×4): 2.5 mg via RESPIRATORY_TRACT
  Filled 2021-07-26 (×4): qty 3

## 2021-07-26 MED ORDER — INSULIN ASPART 100 UNIT/ML IJ SOLN
0.0000 [IU] | Freq: Every day | INTRAMUSCULAR | Status: DC
  Administered 2021-07-26 – 2021-07-27 (×2): 2 [IU] via SUBCUTANEOUS
  Filled 2021-07-26 (×2): qty 1

## 2021-07-26 MED ORDER — INSULIN ASPART 100 UNIT/ML IJ SOLN
0.0000 [IU] | Freq: Three times a day (TID) | INTRAMUSCULAR | Status: DC
  Administered 2021-07-26: 1 [IU] via SUBCUTANEOUS
  Administered 2021-07-27: 2 [IU] via SUBCUTANEOUS
  Administered 2021-07-27: 5 [IU] via SUBCUTANEOUS
  Administered 2021-07-28: 2 [IU] via SUBCUTANEOUS
  Filled 2021-07-26 (×3): qty 1

## 2021-07-26 MED ORDER — OSMOLITE 1.2 CAL PO LIQD: 1000.0000 mL | ORAL | Status: DC

## 2021-07-26 MED ORDER — LISINOPRIL 5 MG PO TABS
2.5000 mg | ORAL_TABLET | Freq: Every day | ORAL | Status: DC
  Administered 2021-07-26 – 2021-07-27 (×2): 2.5 mg via ORAL
  Filled 2021-07-26 (×2): qty 1

## 2021-07-26 MED ORDER — HEPARIN BOLUS VIA INFUSION
3000.0000 [IU] | Freq: Once | INTRAVENOUS | Status: AC
  Administered 2021-07-26: 3000 [IU] via INTRAVENOUS
  Filled 2021-07-26: qty 3000

## 2021-07-26 MED ORDER — ONDANSETRON HCL 4 MG/2ML IJ SOLN: 4.0000 mg | Freq: Four times a day (QID) | INTRAMUSCULAR | Status: DC | PRN

## 2021-07-26 MED ORDER — FUROSEMIDE 10 MG/ML IJ SOLN
20.0000 mg | Freq: Once | INTRAMUSCULAR | Status: AC
  Administered 2021-07-26: 20 mg via INTRAVENOUS
  Filled 2021-07-26: qty 4

## 2021-07-26 MED ORDER — ROPINIROLE HCL 1 MG PO TABS
2.0000 mg | ORAL_TABLET | Freq: Two times a day (BID) | ORAL | Status: DC
  Administered 2021-07-26 – 2021-07-27 (×3): 2 mg
  Filled 2021-07-26 (×6): qty 2

## 2021-07-26 MED ORDER — ASPIRIN EC 81 MG PO TBEC
81.0000 mg | DELAYED_RELEASE_TABLET | Freq: Every day | ORAL | Status: DC
  Filled 2021-07-26: qty 1

## 2021-07-26 MED ORDER — CARVEDILOL 6.25 MG PO TABS
3.1250 mg | ORAL_TABLET | Freq: Two times a day (BID) | ORAL | Status: DC
  Administered 2021-07-26 – 2021-07-28 (×5): 3.125 mg via ORAL
  Filled 2021-07-26 (×5): qty 1

## 2021-07-26 MED ORDER — ACETAMINOPHEN 325 MG PO TABS
650.0000 mg | ORAL_TABLET | ORAL | Status: DC | PRN
  Administered 2021-07-28: 650 mg via ORAL
  Filled 2021-07-26 (×2): qty 2

## 2021-07-26 MED ORDER — ACETAMINOPHEN 160 MG/5ML PO SOLN
650.0000 mg | Freq: Four times a day (QID) | ORAL | Status: DC | PRN
  Administered 2021-07-26: 650 mg
  Filled 2021-07-26 (×3): qty 20.3

## 2021-07-26 MED ORDER — NITROGLYCERIN 0.4 MG SL SUBL: 0.4000 mg | SUBLINGUAL_TABLET | SUBLINGUAL | Status: DC | PRN

## 2021-07-26 MED ORDER — HEPARIN BOLUS VIA INFUSION
750.0000 [IU] | Freq: Once | INTRAVENOUS | Status: AC
  Administered 2021-07-26: 750 [IU] via INTRAVENOUS
  Filled 2021-07-26: qty 750

## 2021-07-26 MED ORDER — HEPARIN (PORCINE) 25000 UT/250ML-% IV SOLN
800.0000 [IU]/h | INTRAVENOUS | Status: DC
  Administered 2021-07-26: 600 [IU]/h via INTRAVENOUS
  Administered 2021-07-27: 800 [IU]/h via INTRAVENOUS
  Filled 2021-07-26 (×2): qty 250

## 2021-07-26 MED ORDER — KATE FARMS STANDARD 1.4 PO LIQD
325.0000 mL | Freq: Four times a day (QID) | ORAL | Status: DC
  Administered 2021-07-26 – 2021-07-27 (×8): 325 mL
  Filled 2021-07-26: qty 325

## 2021-07-26 MED ORDER — HEPARIN BOLUS VIA INFUSION
700.0000 [IU] | Freq: Once | INTRAVENOUS | Status: AC
  Administered 2021-07-26: 700 [IU] via INTRAVENOUS
  Filled 2021-07-26: qty 700

## 2021-07-26 MED ORDER — FREE WATER
150.0000 mL | Freq: Four times a day (QID) | Status: DC
  Administered 2021-07-26 – 2021-07-27 (×8): 150 mL
  Filled 2021-07-26: qty 150

## 2021-07-26 NOTE — Progress Notes (Signed)
ANTICOAGULATION CONSULT NOTE - Initial Consult  Pharmacy Consult for Heparin  Indication: chest pain/ACS  Allergies  Allergen Reactions   Elemental Sulfur Diarrhea and Nausea And Vomiting   Gabapentin Swelling   Lipitor [Atorvastatin] Other (See Comments)    Muscle aches   Mevacor [Lovastatin] Other (See Comments)    Muscle aches   Milk-Related Compounds Other (See Comments)    Large quantities cause headaches    Septra [Sulfamethoxazole-Trimethoprim] Other (See Comments)    Unknown   Statins Other (See Comments)    Muscle pain   Zocor [Simvastatin] Other (See Comments)    Muscle aches    Patient Measurements: Height: 5\' 5"  (165.1 cm) Weight: 50.8 kg (112 lb) IBW/kg (Calculated) : 57 Heparin Dosing Weight: 50.8 kg   Vital Signs: Temp: 97.8 F (36.6 C) (10/25 2254) Temp Source: Oral (10/25 2254) BP: 126/47 (10/26 0230) Pulse Rate: 64 (10/26 0230)  Labs: Recent Labs    07/25/21 2258 07/26/21 0055  HGB 8.3*  --   HCT 25.1*  --   PLT 305  --   APTT  --  27  LABPROT  --  12.8  INR  --  1.0  CREATININE 1.24*  --   TROPONINIHS 186* 225*    Estimated Creatinine Clearance: 24.7 mL/min (A) (by C-G formula based on SCr of 1.24 mg/dL (H)).   Medical History: Past Medical History:  Diagnosis Date   Anemia    IRON INFUSIONS   Aortic valve sclerosis    Arrhythmia    Arthritis    osteoarthritis   Basal cell carcinoma of skin    Brain tumor ([place])    Brain tumor ([place])    Cervical spine disease    CHF (congestive heart failure) (HCC)    Diabetes mellitus without complication (HCC)    Dysrhythmia    sinus arrhythmia   Esophageal stricture    severe, led to feeding tube placement in Sept 2019   Esophageal ulcer without bleeding    Gastrostomy tube dependent ([place])    DOES NOT EAT OR DRINK    GERD (gastroesophageal reflux disease)    Hearing loss    pt had hearing test  last month, left ear30% hearing due to stroke, 78% in right-normal for her age   History of  kidney stones    Hyperlipemia    Hypertension    Kidney stones    Leaky heart valve    Meningioma (HCC)    Occlusive mesenteric ischemia (HCC)    Pulmonary hypertension (HCC)    RLS (restless legs syndrome)    Stroke (HCC)    Vertigo     Medications:  (Not in a hospital admission)   Assessment: Pharmacy consulted to dose heparin in this 87 year old female admitted with ACS/NSTEMI.  No prior anticoag noted.  CrCl = 24.7 ml/min   Goal of Therapy:  Heparin level 0.3-0.7 units/ml Monitor platelets by anticoagulation protocol: Yes   Plan:  Give 3000 units bolus x 1 Start heparin infusion at 600 units/hr Check anti-Xa level in 8 hours and daily while on heparin Continue to monitor H&H and platelets  Keyshawn Hellwig D 07/26/2021,2:57 AM

## 2021-07-26 NOTE — Progress Notes (Signed)
*  PRELIMINARY RESULTS* Echocardiogram 2D Echocardiogram has been performed.  [name] 07/26/2021, 10:32 AM

## 2021-07-26 NOTE — Progress Notes (Signed)
Patient ID: [name] Day, female   DOB: [birth year], 87 y.o.   MRN: [id number] This is a no charge progress note as patient was admitted this AM.  Patient seen and examined H&P reviewed daughter at bedside. 87 year old female referred for evaluation of acute on chronic combined systolic and diastolic congestive heart failure.  The patient was in her usual state of health till 1 day prior to admission she developed shortness of breath and pain all over.  The patient denies [place] chest pain.  Upon arrival to [facility] ED, ECG revealed sinus rhythm 78 bpm with LVH and repolarization changes.  X-ray revealed mild interstitial edema with small pleural effusions.  The patient was treated with intravenous furosemide with overall clinical improvement.  Admission labs notable for borderline elevated troponin (186, 225), and elevated BNP 1541.  Patient was started on heparin drip.  Cardiology was consulted Was given lasix.  Pt reports feeling little better from breathing stand point. No cp   Decrease bs , no wheezing Regular s1/s2 Soft benign +bs No edema  A/P: Cards input appreciated Continue heparin then resume aspirin and Plavix once heparin discontinued Echo done results pending Continue IV Lasix Monitor electrolytes and renal function

## 2021-07-26 NOTE — Consult Note (Signed)
   Heart Failure Nurse Navigator Note  HFrEF 35 to 40%.  Grade 2 diastolic dysfunction by echocardiogram in March 2022.  Echocardiogram is pending on this admission.  She presented from home to the ED with complaints of fatigue, chest pain and shortness of breath.  Chest x-ray revealed mild interstitial edema and small pleural effusions.  Comorbidities:  Chronic kidney disease CVA Osteoarthritis Diabetes GERD Hyperlipidemia Hypertension Pulmonary hypertension Aortic stenosis Anemia Esophageal stricture with placement of gastrostomy tube  Labs:  BNP 1541, hemoglobin 8.3 Hemoglobin A1c 5.3, sodium 132, potassium 4.9, chloride 97, CO2 28, BUN 56, creatinine 1.24 Weight is 50.8 kg Blood pressure 124/70   Medications:  Coreg 3.125 mg twice a day Lisinopril 2.5 mg daily Aspirin 81 mg daily [employer] feeding supplement   Initial meeting with patient and her daughter [name] in the emergency room.  She is currently lying on the gurney in no acute distress, smiling.  Discussed the importance of daily weights and reporting to physician.  Discussed sodium intake, and and she does not eat anything other than her supplement 2-3 times a day.  She does take sips of apple juice that she can tolerate and also sips of milkshakes but she states that that is not every day as a once in a while treat.  Discussed following up in the heart failure clinic.  She has an appointment on November 10 at 11 AM.  They were given the living with heart failure information along with low-sodium information and information on the heart failure clinic itself.  They had no further questions and we will continue to follow along.  Pricilla Riffle RN [facility]

## 2021-07-26 NOTE — H&P (Signed)
History and Physical    [name] Day SHF:[id number] DOB: Dec 18, 1928 DOA: 07/25/2021  PCP: [name], MD   Patient coming from: home  I have personally briefly reviewed patient's old medical records in [place]  Chief Complaint: chest pain and shortness of breath  HPI: [name] Day is a 87 y.o. female with medical history significant for HTN, DM, CVA stroke, gastrostomy secondary to esophageal stricture, combined CHF, CKD 3a and aortic stenosis who presents with a 1 day history of fatigue with onset of chest pain and shortness of breath after going to bed prompting the visit to the ED.  She has had no cough fever or chills, no nausea vomiting abdominal pain or diarrhea.  chest pain was difficult for her to describe.  History provided mostly by daughter at bedside  ED course: On arrival BP 123/54, pulse 78, O2 sat 94% on room air and afebrile Blood work significant for troponin of 186-225 and BNP 1541.  Creatinine 1.24 which is at baseline for her CKD 3A.  EKG, personally viewed and interpreted: Sinus rhythm at 78 with nonspecific ST-T wave changes  Chest x-ray: Cardiomegaly with small pleural effusions and mild interstitial edema  Patient started on a heparin infusion for possible NSTEMI given IV Lasix.  Hospitalist consulted for admission.  Review of Systems: As per HPI otherwise all other systems on review of systems negative.    Past Medical History:  Diagnosis Date   Anemia    IRON INFUSIONS   Aortic valve sclerosis    Arrhythmia    Arthritis    osteoarthritis   Basal cell carcinoma of skin    Brain tumor (HCC)    Brain tumor ([place])    Cervical spine disease    CHF (congestive heart failure) (HCC)    Diabetes mellitus without complication (HCC)    Dysrhythmia    sinus arrhythmia   Esophageal stricture    severe, led to feeding tube placement in Sept 2019   Esophageal ulcer without bleeding    Gastrostomy tube dependent ([place])    DOES NOT EAT OR  DRINK    GERD (gastroesophageal reflux disease)    Hearing loss    pt had hearing test  last month, left ear30% hearing due to stroke, 78% in right-normal for her age   History of kidney stones    Hyperlipemia    Hypertension    Kidney stones    Leaky heart valve    Meningioma (HCC)    Occlusive mesenteric ischemia (HCC)    Pulmonary hypertension (HCC)    RLS (restless legs syndrome)    Stroke ([place])    Vertigo     Past Surgical History:  Procedure Laterality Date   ABDOMINAL HYSTERECTOMY     APPENDECTOMY     ARTHROGRAM KNEE Left    BACK SURGERY     CERVIVAL NECK FUSION   CATARACT EXTRACTION W/PHACO Left 11/11/2018   Procedure: CATARACT EXTRACTION PHACO AND INTRAOCULAR LENS PLACEMENT ([place]) LEFT, DIABETIC;  Surgeon: Birder Robson, MD;  Location: [facility] ORS;  Service: Ophthalmology;  Laterality: Left;  [place]  00:41 CDE 6.41 Fluid pack lot # [id number]   COLONOSCOPY WITH PROPOFOL N/A 06/20/2017   Procedure: COLONOSCOPY WITH PROPOFOL;  Surgeon: Lollie Sails, MD;  Location: [facility] ENDOSCOPY;  Service: Endoscopy;  Laterality: N/A;   ESOPHAGOGASTRODUODENOSCOPY (EGD) WITH PROPOFOL N/A 03/14/2015   Procedure: ESOPHAGOGASTRODUODENOSCOPY (EGD) WITH PROPOFOL;  Surgeon: Josefine Class, MD;  Location: [facility] ENDOSCOPY;  Service: Endoscopy;  Laterality:  N/A;   ESOPHAGOGASTRODUODENOSCOPY (EGD) WITH PROPOFOL N/A 03/28/2017   Procedure: ESOPHAGOGASTRODUODENOSCOPY (EGD) WITH PROPOFOL;  Surgeon: Lollie Sails, MD;  Location: [facility] ENDOSCOPY;  Service: Endoscopy;  Laterality: N/A;   ESOPHAGOGASTRODUODENOSCOPY (EGD) WITH PROPOFOL N/A 06/20/2017   Procedure: ESOPHAGOGASTRODUODENOSCOPY (EGD) WITH PROPOFOL;  Surgeon: Lollie Sails, MD;  Location: [facility] ENDOSCOPY;  Service: Endoscopy;  Laterality: N/A;   ESOPHAGOGASTRODUODENOSCOPY (EGD) WITH PROPOFOL N/A 10/21/2017   Procedure: ESOPHAGOGASTRODUODENOSCOPY (EGD) WITH PROPOFOL;  Surgeon: Lollie Sails, MD;  Location: [facility] ENDOSCOPY;  Service:  Endoscopy;  Laterality: N/A;   ESOPHAGOGASTRODUODENOSCOPY (EGD) WITH PROPOFOL N/A 04/15/2018   Procedure: ESOPHAGOGASTRODUODENOSCOPY (EGD) WITH PROPOFOL;  Surgeon: Lollie Sails, MD;  Location: [facility] ENDOSCOPY;  Service: Endoscopy;  Laterality: N/A;   ESOPHAGUS SURGERY     CLOSURE   EYE SURGERY     HYSTERECTOMY ABDOMINAL WITH SALPINGECTOMY     IR GASTROSTOMY TUBE MOD SED  06/11/2018   IR GASTROSTOMY TUBE REMOVAL  03/25/2019   IR REPLACE G-TUBE SIMPLE WO FLUORO  10/19/2020   PERIPHERAL VASCULAR CATHETERIZATION N/A 01/23/2016   Procedure: Visceral Venography;  Surgeon: Algernon Huxley, MD;  Location: [place] CV LAB;  Service: Cardiovascular;  Laterality: N/A;   PERIPHERAL VASCULAR CATHETERIZATION  01/23/2016   Procedure: Peripheral Vascular Intervention;  Surgeon: Algernon Huxley, MD;  Location: [place] CV LAB;  Service: Cardiovascular;;   UPPER ESOPHAGEAL ENDOSCOPIC ULTRASOUND (EUS) N/A 12/05/2017   Procedure: UPPER ESOPHAGEAL ENDOSCOPIC ULTRASOUND (EUS);  Surgeon: Jola Schmidt, MD;  Location: [facility] ENDOSCOPY;  Service: Endoscopy;  Laterality: N/A;   VISCERAL ANGIOGRAPHY N/A 03/18/2017   Procedure: Visceral Angiography;  Surgeon: Algernon Huxley, MD;  Location: [place] CV LAB;  Service: Cardiovascular;  Laterality: N/A;   VISCERAL ARTERY INTERVENTION N/A 03/18/2017   Procedure: Visceral Artery Intervention;  Surgeon: Algernon Huxley, MD;  Location: [place] CV LAB;  Service: Cardiovascular;  Laterality: N/A;     reports that she has never smoked. She has never used smokeless tobacco. She reports that she does not drink alcohol and does not use drugs.  Allergies  Allergen Reactions   Elemental Sulfur Diarrhea and Nausea And Vomiting   Gabapentin Swelling   Lipitor [Atorvastatin] Other (See Comments)    Muscle aches   Mevacor [Lovastatin] Other (See Comments)    Muscle aches   Milk-Related Compounds Other (See Comments)    Large quantities cause headaches    Septra  [Sulfamethoxazole-Trimethoprim] Other (See Comments)    Unknown   Statins Other (See Comments)    Muscle pain   Zocor [Simvastatin] Other (See Comments)    Muscle aches    Family History  Problem Relation Age of Onset   Stroke Mother    Hypertension Mother    Heart disease Mother    Cancer Father    Heart disease Father    Heart attack Sister    Diabetes Sister    Heart attack Brother    Thyroid cancer Daughter    Cancer - Colon Daughter       Prior to Admission medications   Medication Sig Start Date End Date Taking? Authorizing Provider  acetaminophen (TYLENOL) 160 MG/5ML solution Place 20.3 mLs (650 mg total) into feeding tube every 6 (six) hours as needed for mild pain. 06/13/18   Demetrios Loll, MD  allopurinol (ZYLOPRIM) 100 MG tablet Take by mouth. 05/26/21 11/22/21  [provider]  Amino Acids-Protein Hydrolys (PRO-STAT) LIQD Take 8 oz via feeding tube 4 (four) times daily 06/13/18   [provider]  ascorbic acid (VITAMIN C) 250 MG tablet Take 1 tablet by G tube 2 (two) times daily 06/13/18   [provider]  aspirin 81 MG chewable tablet Place 1 tablet (81 mg total) into feeding tube daily. 12/22/20   Regalado, Belkys A, MD  Bromfenac Sodium 0.09 % SOLN Place 1 drop into the left eye 2 (two) times daily. 06/15/21   [provider]  carvedilol (COREG) 3.125 MG tablet Place 1 tablet (3.125 mg total) into feeding tube 2 (two) times daily with a meal. 12/22/20   Regalado, Belkys A, MD  clopidogrel (PLAVIX) 75 MG tablet GIVE 1 TABLET VIA FEEDING TUBE EVERY DAY AS DIRECTED 12/30/20   Love, Ivan Anchors, PA-C  clopidogrel (PLAVIX) 75 MG tablet Take 1 tablet (75 mg total) by G tube once daily 04/11/21   [provider]  furosemide (LASIX) 20 MG tablet Place 0.5 tablets (10 mg total) into feeding tube daily. Patient taking differently: Place 20 mg into feeding tube 2 (two) times daily. 12/30/20   Love, Ivan Anchors, PA-C  gentamicin ointment (GARAMYCIN) 0.1 %  Apply topically. 03/13/21   [provider]  hydrocerin (EUCERIN) CREA Apply 1 application topically 2 (two) times daily. To bilateral feet 12/27/20   Love, Ivan Anchors, PA-C  Hypromellose 0.2 % SOLN Place 1 drop into both eyes 3 (three) times daily as needed (dry eyes).    [provider]  lisinopril (ZESTRIL) 2.5 MG tablet Take 1 tablet (2.5 mg total) by mouth daily. 12/31/20   Love, Ivan Anchors, PA-C  loperamide (IMODIUM) 1 MG/5ML solution Take 1 mg by mouth as needed for diarrhea or loose stools.    [provider]  metFORMIN (GLUCOPHAGE) 500 MG tablet Place 1 tablet (500 mg total) into feeding tube 2 (two) times daily with a meal. 02/17/21   Fritzi Mandes, MD  Multiple Vitamin (MULTIVITAMIN) LIQD Place 15 mLs into feeding tube daily. 06/13/18   Demetrios Loll, MD  Nutritional Supplements (FEEDING SUPPLEMENT, KATE FARMS STANDARD 1.4,) LIQD liquid Place 325 mLs into feeding tube 4 (four) times daily. Patient taking differently: Place 325 mLs into feeding tube 3 (three) times daily between meals. 12/30/20   Love, Ivan Anchors, PA-C  pantoprazole sodium (PROTONIX) 40 mg/20 mL PACK Place 20 mLs (40 mg total) into feeding tube daily. 12/30/20   Love, Ivan Anchors, PA-C  polyethylene glycol (MIRALAX / GLYCOLAX) 17 g packet Place 17 g into feeding tube daily as needed. 12/30/20   Love, Ivan Anchors, PA-C  rOPINIRole (REQUIP) 2 MG tablet Place 1 tablet (2 mg total) into feeding tube 2 (two) times daily. 12/30/20   Love, Ivan Anchors, PA-C  Water For Irrigation, Sterile (FREE WATER) SOLN Place 100 mLs into feeding tube every 6 (six) hours. 12/30/20   Bary Leriche, PA-C    Physical Exam: Vitals:   07/26/21 0130 07/26/21 0200 07/26/21 0230 07/26/21 0300  BP: (!) 129/52 (!) 130/49 (!) 126/47 (!) 123/57  Pulse: 80 72 64 68  Resp: 20 18 19 16   Temp:      TempSrc:      SpO2: 100% 99% 100% 100%  Weight:      Height:         Vitals:   07/26/21 0130 07/26/21 0200 07/26/21 0230 07/26/21 0300  BP: (!) 129/52 (!)  130/49 (!) 126/47 (!) 123/57  Pulse: 80 72 64 68  Resp: 20 18 19 16   Temp:      TempSrc:  SpO2: 100% 99% 100% 100%  Weight:      Height:          Constitutional: Alert and oriented x 3 . Not in any apparent distress HEENT:      Head: Normocephalic and atraumatic.         Eyes: PERLA, EOMI, Conjunctivae are normal. Sclera is non-icteric.       Mouth/Throat: Mucous membranes are moist.       Neck: Supple with no signs of meningismus. Cardiovascular: Regular rate and rhythm. No murmurs, gallops, or rubs. 2+ symmetrical distal pulses are present . No JVD. No LE edema Respiratory: Respiratory effort normal .Lungs sounds diminished bilaterally. No wheezes, crackles, or rhonchi.  Gastrointestinal: Soft, non tender, and non distended with positive bowel sounds.  Genitourinary: No CVA tenderness. Musculoskeletal: Nontender with normal range of motion in all extremities. No cyanosis, or erythema of extremities. Neurologic:  Face is symmetric. Moving all extremities. No gross focal neurologic deficits . Skin: Skin is warm, dry.  No rash or ulcers Psychiatric: Mood and affect are normal    Labs on Admission: I have personally reviewed following labs and imaging studies  CBC: Recent Labs  Lab 07/25/21 2258  WBC 6.7  HGB 8.3*  HCT 25.1*  MCV 95.1  PLT 177   Basic Metabolic Panel: Recent Labs  Lab 07/25/21 2258  NA 132*  K 4.9  CL 97*  CO2 28  GLUCOSE 224*  BUN 56*  CREATININE 1.24*  CALCIUM 8.8*   GFR: Estimated Creatinine Clearance: 24.7 mL/min (A) (by C-G formula based on SCr of 1.24 mg/dL (H)). Liver Function Tests: No results for input(s): AST, ALT, ALKPHOS, BILITOT, PROT, ALBUMIN in the last 168 hours. No results for input(s): LIPASE, AMYLASE in the last 168 hours. No results for input(s): AMMONIA in the last 168 hours. Coagulation Profile: Recent Labs  Lab 07/26/21 0055  INR 1.0   Cardiac Enzymes: No results for input(s): CKTOTAL, CKMB, CKMBINDEX,  TROPONINI in the last 168 hours. BNP (last 3 results) No results for input(s): PROBNP in the last 8760 hours. HbA1C: No results for input(s): HGBA1C in the last 72 hours. CBG: No results for input(s): GLUCAP in the last 168 hours. Lipid Profile: No results for input(s): CHOL, HDL, LDLCALC, TRIG, CHOLHDL, LDLDIRECT in the last 72 hours. Thyroid Function Tests: No results for input(s): TSH, T4TOTAL, FREET4, T3FREE, THYROIDAB in the last 72 hours. Anemia Panel: No results for input(s): VITAMINB12, FOLATE, FERRITIN, TIBC, IRON, RETICCTPCT in the last 72 hours. Urine analysis:    Component Value Date/Time   COLORURINE YELLOW (A) 12/21/2020 1550   APPEARANCEUR HAZY (A) 12/21/2020 1550   LABSPEC 1.015 12/21/2020 1550   PHURINE 7.0 12/21/2020 1550   GLUCOSEU NEGATIVE 12/21/2020 1550   HGBUR SMALL (A) 12/21/2020 1550   BILIRUBINUR NEGATIVE 12/21/2020 [place] 12/21/2020 1550   PROTEINUR 30 (A) 12/21/2020 1550   NITRITE POSITIVE (A) 12/21/2020 1550   LEUKOCYTESUR LARGE (A) 12/21/2020 1550    Radiological Exams on Admission: DG Chest 2 View  Result Date: 07/25/2021 CLINICAL DATA:  Shortness of breath EXAM: CHEST - 2 VIEW COMPARISON:  02/15/2021, 11/23/2019 FINDINGS: Cardiomegaly with vascular congestion and mild interstitial pulmonary edema. Small bilateral pleural effusions. Aortic atherosclerosis. No pneumothorax. IMPRESSION: Cardiomegaly with small pleural effusions and mild interstitial edema. Electronically Signed   By: Donavan Foil M.D.   On: 07/25/2021 23:14   US Venous Img Lower Unilateral Left (DVT)  Result Date: 07/24/2021 CLINICAL DATA:  LEFT lower  extremity swelling and pain EXAM: LEFT LOWER EXTREMITY VENOUS DOPPLER ULTRASOUND TECHNIQUE: Gray-scale sonography with compression, as well as color and duplex ultrasound, were performed to evaluate the deep venous system(s) from the level of the common femoral vein through the popliteal and proximal calf veins.  COMPARISON:  Chest radiograph, 02/15/2021. FINDINGS: VENOUS Normal compressibility of the common femoral, superficial femoral, and popliteal veins. Visualized portions of profunda femoral vein and great saphenous vein unremarkable. No filling defects to suggest DVT on grayscale or color Doppler imaging. Doppler waveforms show normal direction of venous flow, normal respiratory plasticity and response to augmentation. Limited views of the contralateral common femoral vein are unremarkable. OTHER No evidence of superficial thrombophlebitis or abnormal fluid collection. Limitations: Patient body habitus, specifically lower extremity edema IMPRESSION: 1. No evidence of femoropopliteal DVT within the LEFT lower extremity. 2. Suboptimal evaluation of the extremity calf veins secondary to lower extremity edema. Michaelle Birks, MD [facility] Electronically Signed   By: Michaelle Birks M.D.   On: 07/24/2021 10:49     Assessment/Plan    Acute on chronic combined systolic and diastolic CHF (congestive heart failure)  -Patient presents with chest pain and shortness of breath, BNP 1541 with small effusions on chest x-ray - Echocardiogram 12/18/2020 with EF 35 to 40% and grade 2 diastolic dysfunction - Repeat echo - IV Lasix - Continue home lisinopril spironolactone and Coreg - Daily weights with intake and output monitoring    NSTEMI (non-ST elevated myocardial infarction) [facility]) - Patient presents with chest pain, EKG nonacute, troponin 186-225 - Suspect demand ischemia related to CHF exacerbation - Continue to trend to peak - Continue heparin infusion started from the ED - Echocardiogram to evaluate for focal wall motion abnormality - Cardiology consult  Acute on chronic anemia - Hemoglobin 8.3, down from 9.5 a month prior - Patient follows with hematology, gets IV iron infusions - Has had blood transfusions in the past - Monitor H&H in view of  Heparin treatment    Hypertension - Continue lisinopril and Coreg    Type 2 diabetes mellitus with hyperlipidemia (HCC) - Sliding scale insulin coverage    Gastrostomy tube dependent secondary to esophageal stricture/stenosis - G-tube feeds    Right sided weakness secondary to CVA - Hold aspirin and Plavix while on heparin infusion to decrease bleeding risk    Stage 3a chronic kidney disease (HCC) - Renal function at baseline    DVT prophylaxis: Heparin infusion Code Status: DNR Family Communication:  none  Disposition Plan: Back to previous home environment Consults called: Cardiology Status:At the time of admission, it appears that the appropriate admission status for this patient is INPATIENT. This is judged to be reasonable and necessary in order to provide the required intensity of service to ensure the patient's safety given the presenting symptoms, physical exam findings, and initial radiographic and laboratory data in the context of their  Comorbid conditions.   Patient requires inpatient status due to high intensity of service, high risk for further deterioration and high frequency of surveillance required.   I certify that at the point of admission it is my clinical judgment that the patient will require inpatient hospital care spanning beyond [place] MD [facility]     07/26/2021, 3:59 AM

## 2021-07-26 NOTE — Consult Note (Signed)
[facility] Cardiology  CARDIOLOGY CONSULT NOTE  Patient ID: [name] Day MRN: [id number] DOB/AGE: [id number] 85 y.o.  Admit date: 07/25/2021 Referring Physician [place] Primary Physician [facility] Primary Cardiologist Fath Reason for Consultation acute on chronic combined congestive heart failure  HPI: 87 year old female referred for evaluation of acute on chronic combined systolic and diastolic congestive heart failure.  The patient was in her usual state of health till 1 day prior to admission she developed shortness of breath and pain all over.  The patient denies [place] chest pain.  Upon arrival to [facility] ED, ECG revealed sinus rhythm 78 bpm with LVH and repolarization changes.  X-ray revealed mild interstitial edema with small pleural effusions.  The patient was treated with intravenous furosemide with overall clinical improvement.  Admission labs notable for borderline elevated troponin (186, 225), and elevated BNP 1541.  The patient has chronic kidney disease, BUN/creatinine were 56 and 1.24, respectively.  The patient is status post CVA 12/17/2020 on aspirin and Plavix.  Review of systems complete and found to be negative unless listed above     Past Medical History:  Diagnosis Date   Anemia    IRON INFUSIONS   Aortic valve sclerosis    Arrhythmia    Arthritis    osteoarthritis   Basal cell carcinoma of skin    Brain tumor (HCC)    Brain tumor (HCC)    Cervical spine disease    CHF (congestive heart failure) (HCC)    Diabetes mellitus without complication (HCC)    Dysrhythmia    sinus arrhythmia   Esophageal stricture    severe, led to feeding tube placement in Sept 2019   Esophageal ulcer without bleeding    Gastrostomy tube dependent ([place])    DOES NOT EAT OR DRINK    GERD (gastroesophageal reflux disease)    Hearing loss    pt had hearing test  last month, left ear30% hearing due to stroke, 78% in right-normal for her age   History of kidney stones    Hyperlipemia     Hypertension    Kidney stones    Leaky heart valve    Meningioma (HCC)    Occlusive mesenteric ischemia (HCC)    Pulmonary hypertension (HCC)    RLS (restless legs syndrome)    Stroke ([place])    Vertigo     Past Surgical History:  Procedure Laterality Date   ABDOMINAL HYSTERECTOMY     APPENDECTOMY     ARTHROGRAM KNEE Left    BACK SURGERY     CERVIVAL NECK FUSION   CATARACT EXTRACTION W/PHACO Left 11/11/2018   Procedure: CATARACT EXTRACTION PHACO AND INTRAOCULAR LENS PLACEMENT ([place]) LEFT, DIABETIC;  Surgeon: Birder Robson, MD;  Location: [facility] ORS;  Service: Ophthalmology;  Laterality: Left;  [place]  00:41 CDE 6.41 Fluid pack lot # [id number]   COLONOSCOPY WITH PROPOFOL N/A 06/20/2017   Procedure: COLONOSCOPY WITH PROPOFOL;  Surgeon: Lollie Sails, MD;  Location: [facility] ENDOSCOPY;  Service: Endoscopy;  Laterality: N/A;   ESOPHAGOGASTRODUODENOSCOPY (EGD) WITH PROPOFOL N/A 03/14/2015   Procedure: ESOPHAGOGASTRODUODENOSCOPY (EGD) WITH PROPOFOL;  Surgeon: Josefine Class, MD;  Location: [facility] ENDOSCOPY;  Service: Endoscopy;  Laterality: N/A;   ESOPHAGOGASTRODUODENOSCOPY (EGD) WITH PROPOFOL N/A 03/28/2017   Procedure: ESOPHAGOGASTRODUODENOSCOPY (EGD) WITH PROPOFOL;  Surgeon: Lollie Sails, MD;  Location[facility] ENDOSCOPY;  Service: Endoscopy;  Laterality: N/A;   ESOPHAGOGASTRODUODENOSCOPY (EGD) WITH PROPOFOL N/A 06/20/2017   Procedure: ESOPHAGOGASTRODUODENOSCOPY (EGD) WITH PROPOFOL;  Surgeon: Lollie Sails, MD;  Location: [facility] ENDOSCOPY;  Service: Endoscopy;  Laterality: N/A;   ESOPHAGOGASTRODUODENOSCOPY (EGD) WITH PROPOFOL N/A 10/21/2017   Procedure: ESOPHAGOGASTRODUODENOSCOPY (EGD) WITH PROPOFOL;  Surgeon: Lollie Sails, MD;  Location: [facility] ENDOSCOPY;  Service: Endoscopy;  Laterality: N/A;   ESOPHAGOGASTRODUODENOSCOPY (EGD) WITH PROPOFOL N/A 04/15/2018   Procedure: ESOPHAGOGASTRODUODENOSCOPY (EGD) WITH PROPOFOL;  Surgeon: Lollie Sails, MD;  Location: [facility] ENDOSCOPY;  Service:  Endoscopy;  Laterality: N/A;   ESOPHAGUS SURGERY     CLOSURE   EYE SURGERY     HYSTERECTOMY ABDOMINAL WITH SALPINGECTOMY     IR GASTROSTOMY TUBE MOD SED  06/11/2018   IR GASTROSTOMY TUBE REMOVAL  03/25/2019   IR REPLACE G-TUBE SIMPLE WO FLUORO  10/19/2020   PERIPHERAL VASCULAR CATHETERIZATION N/A 01/23/2016   Procedure: Visceral Venography;  Surgeon: Algernon Huxley, MD;  Location: [place] CV LAB;  Service: Cardiovascular;  Laterality: N/A;   PERIPHERAL VASCULAR CATHETERIZATION  01/23/2016   Procedure: Peripheral Vascular Intervention;  Surgeon: Algernon Huxley, MD;  Location: [place] CV LAB;  Service: Cardiovascular;;   UPPER ESOPHAGEAL ENDOSCOPIC ULTRASOUND (EUS) N/A 12/05/2017   Procedure: UPPER ESOPHAGEAL ENDOSCOPIC ULTRASOUND (EUS);  Surgeon: Jola Schmidt, MD;  Location: [facility] ENDOSCOPY;  Service: Endoscopy;  Laterality: N/A;   VISCERAL ANGIOGRAPHY N/A 03/18/2017   Procedure: Visceral Angiography;  Surgeon: Algernon Huxley, MD;  Location: [place] CV LAB;  Service: Cardiovascular;  Laterality: N/A;   VISCERAL ARTERY INTERVENTION N/A 03/18/2017   Procedure: Visceral Artery Intervention;  Surgeon: Algernon Huxley, MD;  Location: [place] CV LAB;  Service: Cardiovascular;  Laterality: N/A;    (Not in a hospital admission)  Social History   Socioeconomic History   Marital status: Widowed    Spouse name: Not on file   Number of children: Not on file   Years of education: Not on file   Highest education level: Not on file  Occupational History   Not on file  Tobacco Use   Smoking status: Never   Smokeless tobacco: Never  Vaping Use   Vaping Use: Never used  Substance and Sexual Activity   Alcohol use: Never   Drug use: Never   Sexual activity: Not Currently  Other Topics Concern   Not on file  Social History Narrative   Lives alone.    Daughter helps her.    Social Determinants of Health   Financial Resource Strain: Not on file  Food Insecurity: Not on file   Transportation Needs: Not on file  Physical Activity: Not on file  Stress: Not on file  Social Connections: Not on file  Intimate Partner Violence: Not on file    Family History  Problem Relation Age of Onset   Stroke Mother    Hypertension Mother    Heart disease Mother    Cancer Father    Heart disease Father    Heart attack Sister    Diabetes Sister    Heart attack Brother    Thyroid cancer Daughter    Cancer - Colon Daughter       Review of systems complete and found to be negative unless listed above      PHYSICAL EXAM  General: Well developed, well nourished, in no acute distress HEENT:  Normocephalic and atramatic Neck:  No JVD.  Lungs: Clear bilaterally to auscultation and percussion. Heart: HRRR . Normal S1 and S2 without gallops or murmurs.  Abdomen: Bowel sounds are positive, abdomen soft and non-tender  Msk:  Back normal, normal gait. Normal strength and tone for age. Extremities:  No clubbing, cyanosis or edema.   Neuro: Alert and oriented X 3. Psych:  Good affect, responds appropriately  Labs:   Lab Results  Component Value Date   WBC 6.7 07/25/2021   HGB 8.3 (L) 07/25/2021   HCT 25.1 (L) 07/25/2021   MCV 95.1 07/25/2021   PLT 305 07/25/2021    Recent Labs  Lab 07/25/21 2258  NA 132*  K 4.9  CL 97*  CO2 28  BUN 56*  CREATININE 1.24*  CALCIUM 8.8*  GLUCOSE 224*   Lab Results  Component Value Date   TROPONINI <0.03 06/23/2018    Lab Results  Component Value Date   CHOL 178 12/18/2020   CHOL 178 11/23/2019   Lab Results  Component Value Date   HDL 56 12/18/2020   HDL 67 11/23/2019   Lab Results  Component Value Date   LDLCALC 112 (H) 12/18/2020   LDLCALC 101 (H) 11/23/2019   Lab Results  Component Value Date   TRIG 51 12/18/2020   TRIG 50 11/23/2019   Lab Results  Component Value Date   CHOLHDL 3.2 12/18/2020   CHOLHDL 2.7 11/23/2019   No results found for: LDLDIRECT    Radiology: DG Chest 2 View  Result Date:  07/25/2021 CLINICAL DATA:  Shortness of breath EXAM: CHEST - 2 VIEW COMPARISON:  02/15/2021, 11/23/2019 FINDINGS: Cardiomegaly with vascular congestion and mild interstitial pulmonary edema. Small bilateral pleural effusions. Aortic atherosclerosis. No pneumothorax. IMPRESSION: Cardiomegaly with small pleural effusions and mild interstitial edema. Electronically Signed   By: Donavan Foil M.D.   On: 07/25/2021 23:14   US Venous Img Lower Unilateral Left (DVT)  Result Date: 07/24/2021 CLINICAL DATA:  LEFT lower extremity swelling and pain EXAM: LEFT LOWER EXTREMITY VENOUS DOPPLER ULTRASOUND TECHNIQUE: Gray-scale sonography with compression, as well as color and duplex ultrasound, were performed to evaluate the deep venous system(s) from the level of the common femoral vein through the popliteal and proximal calf veins. COMPARISON:  Chest radiograph, 02/15/2021. FINDINGS: VENOUS Normal compressibility of the common femoral, superficial femoral, and popliteal veins. Visualized portions of profunda femoral vein and great saphenous vein unremarkable. No filling defects to suggest DVT on grayscale or color Doppler imaging. Doppler waveforms show normal direction of venous flow, normal respiratory plasticity and response to augmentation. Limited views of the contralateral common femoral vein are unremarkable. OTHER No evidence of superficial thrombophlebitis or abnormal fluid collection. Limitations: Patient body habitus, specifically lower extremity edema IMPRESSION: 1. No evidence of femoropopliteal DVT within the LEFT lower extremity. 2. Suboptimal evaluation of the extremity calf veins secondary to lower extremity edema. Michaelle Birks, MD [name] Radiology Electronically Signed   By: Michaelle Birks M.D.   On: 07/24/2021 10:49    EKG: Sinus rhythm with LVH with repolarization changes  ASSESSMENT AND PLAN:   1.  Elevated troponin (186, 225), in the absence of  chest pain, with nondiagnostic ECG, in the setting of acute on chronic combined diastolic and systolic congestive heart failure, likely demand supply ischemia 2.  Acute on chronic combined systolic and diastolic congestive heart failure, BNP 1541, chest x-ray revealing mild interstitial edema, clinically improved after initial diuresis.  Patient laying flat in bed, reports feeling much better, denies chest pain or shortness of breath, oxygen saturation 97% on 2 L by nasal cannula. 3.  Anemia, hemoglobin 8.3, on aspirin and Plavix, currently being held, on heparin drip 4.  CVA, residual right-sided weakness, on aspirin and Plavix  Recommendations  1.  Agree with current therapy 2.  Diuresis as needed 3.  Review 2D echocardiogram 4.  Continue heparin drip 24 to 48 hours, recommend DC in a.m. 07/27/2021, and resume aspirin and Plavix as tolerated  Signed: Isaias Cowman MD,PhD, [facility] 07/26/2021, 8:42 AM

## 2021-07-26 NOTE — Progress Notes (Signed)
ANTICOAGULATION CONSULT NOTE - Initial Consult  Pharmacy Consult for Heparin  Indication: chest pain/ACS  Allergies  Allergen Reactions   Elemental Sulfur Diarrhea and Nausea And Vomiting   Gabapentin Swelling   Lipitor [Atorvastatin] Other (See Comments)    Muscle aches   Mevacor [Lovastatin] Other (See Comments)    Muscle aches   Milk-Related Compounds Other (See Comments)    Large quantities cause headaches    Septra [Sulfamethoxazole-Trimethoprim] Other (See Comments)    Unknown   Statins Other (See Comments)    Muscle pain   Zocor [Simvastatin] Other (See Comments)    Muscle aches    Patient Measurements: Height: 5\' 5"  (165.1 cm) Weight: 50.8 kg (112 lb) IBW/kg (Calculated) : 57 Heparin Dosing Weight: 50.8 kg   Vital Signs: BP: 111/59 (10/26 1100) Pulse Rate: 68 (10/26 1111)  Labs: Recent Labs    07/25/21 2258 07/26/21 0055  HGB 8.3*  --   HCT 25.1*  --   PLT 305  --   APTT  --  27  LABPROT  --  12.8  INR  --  1.0  CREATININE 1.24*  --   TROPONINIHS 186* 225*     Estimated Creatinine Clearance: 24.7 mL/min (A) (by C-G formula based on SCr of 1.24 mg/dL (H)).   Medical History: Past Medical History:  Diagnosis Date   Anemia    IRON INFUSIONS   Aortic valve sclerosis    Arrhythmia    Arthritis    osteoarthritis   Basal cell carcinoma of skin    Brain tumor ([place])    Brain tumor ([place])    Cervical spine disease    CHF (congestive heart failure) (HCC)    Diabetes mellitus without complication (HCC)    Dysrhythmia    sinus arrhythmia   Esophageal stricture    severe, led to feeding tube placement in Sept 2019   Esophageal ulcer without bleeding    Gastrostomy tube dependent ([place])    DOES NOT EAT OR DRINK    GERD (gastroesophageal reflux disease)    Hearing loss    pt had hearing test  last month, left ear30% hearing due to stroke, 78% in right-normal for her age   History of kidney stones    Hyperlipemia    Hypertension    Kidney stones     Leaky heart valve    Meningioma (HCC)    Occlusive mesenteric ischemia (HCC)    Pulmonary hypertension (HCC)    RLS (restless legs syndrome)    Stroke (HCC)    Vertigo     Medications:  (Not in a hospital admission)  Assessment: Pharmacy consulted to dose heparin in this 87 year old female admitted with ACS/NSTEMI.  No prior anticoag noted.  CrCl = 24.7 ml/min   10/26 1152 HL= 0.27, subtherapeutic 600 > 700 units/hr  Goal of Therapy:  Heparin level 0.3-0.7 units/ml Monitor platelets by anticoagulation protocol: Yes   Plan:  Heparin subtherapeutic  Give 750 units bolus x 1 Increase heparin infusion to 700 units/hr Check anti-Xa level in 8 hours following rate change Continue to monitor H&H and platelets Heparin until 10/27 AM per cardiology   Dorothe Pea, PharmD, BCPS Clinical Pharmacist   07/26/2021,12:04 PM

## 2021-07-26 NOTE — ED Notes (Signed)
Family at nurses station requesting pain medication for patient in liquid from to instill in PEG tube. Pt endorsing L flank pain.  MD Leslie Dales via secure chat for medication for pain control. Awaiting response.

## 2021-07-26 NOTE — ED Provider Notes (Signed)
[facility] Emergency Department Provider Note  ____________________________________________   Event Date/Time   First MD Initiated Contact with Patient 07/26/21 0251     (approximate)  I have reviewed the triage vital signs and the nursing notes.   HISTORY  Chief Complaint Shortness of Breath  Level 5 caveat:  history/ROS limited by acute/critical illness  HPI [name] Day is a 87 y.o. female with extensive medical history as listed below and whose adult daughter is at the bedside providing most of the history.  The patient presents for evaluation of acute onset shortness of breath and chest pain tonight that occurred within a few hours of arrival.  The patient reportedly did not have a very good day but without any specific symptoms; she spent most of the day in bed.  By the evening she was feeling better and got up and moved around, but then she called her daughter into the room at about 10 PM stating that she could not breathe and that her chest was hurting.  The patient still reports that she feels short of breath.  Nothing in particular makes the symptoms better and exertion makes them worse.  She is grimacing as if she is in pain but she does not describe any specific pain.   No recent fever, nausea, vomiting, abdominal pain, nor dysuria.     Past Medical History:  Diagnosis Date   Anemia    IRON INFUSIONS   Aortic valve sclerosis    Arrhythmia    Arthritis    osteoarthritis   Basal cell carcinoma of skin    Brain tumor (HCC)    Brain tumor ([place])    Cervical spine disease    CHF (congestive heart failure) (HCC)    Diabetes mellitus without complication (HCC)    Dysrhythmia    sinus arrhythmia   Esophageal stricture    severe, led to feeding tube placement in Sept 2019   Esophageal ulcer without bleeding    Gastrostomy tube dependent ([place])    DOES NOT EAT OR DRINK    GERD (gastroesophageal reflux disease)    Hearing loss    pt had  hearing test  last month, left ear30% hearing due to stroke, 78% in right-normal for her age   History of kidney stones    Hyperlipemia    Hypertension    Kidney stones    Leaky heart valve    Meningioma (HCC)    Occlusive mesenteric ischemia (HCC)    Pulmonary hypertension (HCC)    RLS (restless legs syndrome)    Stroke [facility])    Vertigo     Patient Active Problem List   Diagnosis Date Noted   NSTEMI (non-ST elevated myocardial infarction) ([place]) 07/26/2021   Symptomatic anemia 02/15/2021   HTN (hypertension) 02/15/2021   Stroke ([place]) 02/15/2021   Hyperkalemia 02/15/2021   AKI (acute kidney injury) ([place]) 02/15/2021   Chronic combined systolic and diastolic congestive heart failure ([place]) 02/15/2021   Abnormality of gait 01/19/2021   Malnutrition of moderate degree 12/29/2020   Pressure injury of skin 12/29/2020   Anemia of chronic disease    Essential hypertension    Controlled type 2 diabetes mellitus with hyperglycemia, without long-term current use of insulin (HCC)    Hypoalbuminemia due to protein-calorie malnutrition ([place])    Stroke (cerebrum) ([place]) 12/22/2020   Abdominal pain, lower    Cerebrovascular accident (CVA) due to occlusion of left middle cerebral artery (HCC)    Right sided weakness  Weakness    Respiratory failure with hypoxia ([place]) 11/23/2019   CHF exacerbation ([place]) 11/23/2019   HFrEF (heart failure with reduced ejection fraction) ([place]) 11/23/2019   GERD (gastroesophageal reflux disease)    Gastrostomy tube dependent (HCC)    Esophageal ulcer without bleeding    Type II diabetes mellitus with renal manifestations (HCC)    Anemia    Aortic valve disease 08/24/2019   Gastrostomy tube in place ([place]) 07/31/2019   RLS (restless legs syndrome) 07/31/2019   Chest pain, atypical 03/18/2019   Tricuspid regurgitation 02/13/2019   Heart palpitations 02/12/2019   Hyponatremia 06/23/2018   Esophageal stricture 06/11/2018   Weight loss 05/29/2018   Dysphagia  05/29/2018   Esophageal stenosis 05/05/2018   Hyperlipemia 04/04/2018   Itching 01/08/2018   Medication monitoring encounter 01/08/2018   Paronychia of toe 01/08/2018   Esophagitis, CMV ([place]) 01/01/2018   Occlusive mesenteric ischemia ([place]) 05/28/2017   Hypertension 04/30/2017   Mesenteric ischemia ([place]) 03/01/2017   Iron deficiency anemia 02/28/2017   PAD (peripheral artery disease) ([place]) 02/05/2017   Hyperlipidemia type II 04/04/2016   Pulmonary hypertension ([place]) 01/04/2015   Generalized OA 12/13/2014   Leg edema 12/13/2014   Aortic valve sclerosis 02/19/2014   Type 2 diabetes mellitus with hyperlipidemia ([place]) 12/25/2013   Acute, but ill-defined, cerebrovascular disease 12/25/2013   Benign essential hypertension 12/25/2013   Unilateral primary osteoarthritis, unspecified knee 12/25/2013   Personal history of other malignant neoplasm of skin 05/28/2012    Past Surgical History:  Procedure Laterality Date   ABDOMINAL HYSTERECTOMY     APPENDECTOMY     ARTHROGRAM KNEE Left    BACK SURGERY     CERVIVAL NECK FUSION   CATARACT EXTRACTION W/PHACO Left 11/11/2018   Procedure: CATARACT EXTRACTION PHACO AND INTRAOCULAR LENS PLACEMENT ([place]) LEFT, DIABETIC;  Surgeon: Birder Robson, MD;  Location: [facility] ORS;  Service: Ophthalmology;  Laterality: Left;  [place]  00:41 CDE 6.41 Fluid pack lot # [id number]   COLONOSCOPY WITH PROPOFOL N/A 06/20/2017   Procedure: COLONOSCOPY WITH PROPOFOL;  Surgeon: Lollie Sails, MD;  Location: [facility] ENDOSCOPY;  Service: Endoscopy;  Laterality: N/A;   ESOPHAGOGASTRODUODENOSCOPY (EGD) WITH PROPOFOL N/A 03/14/2015   Procedure: ESOPHAGOGASTRODUODENOSCOPY (EGD) WITH PROPOFOL;  Surgeon: Josefine Class, MD;  Location: [facility] ENDOSCOPY;  Service: Endoscopy;  Laterality: N/A;   ESOPHAGOGASTRODUODENOSCOPY (EGD) WITH PROPOFOL N/A 03/28/2017   Procedure: ESOPHAGOGASTRODUODENOSCOPY (EGD) WITH PROPOFOL;  Surgeon: Lollie Sails, MD;  Location: [facility] ENDOSCOPY;   Service: Endoscopy;  Laterality: N/A;   ESOPHAGOGASTRODUODENOSCOPY (EGD) WITH PROPOFOL N/A 06/20/2017   Procedure: ESOPHAGOGASTRODUODENOSCOPY (EGD) WITH PROPOFOL;  Surgeon: Lollie Sails, MD;  Location: [facility] ENDOSCOPY;  Service: Endoscopy;  Laterality: N/A;   ESOPHAGOGASTRODUODENOSCOPY (EGD) WITH PROPOFOL N/A 10/21/2017   Procedure: ESOPHAGOGASTRODUODENOSCOPY (EGD) WITH PROPOFOL;  Surgeon: Lollie Sails, MD;  Location: [facility] ENDOSCOPY;  Service: Endoscopy;  Laterality: N/A;   ESOPHAGOGASTRODUODENOSCOPY (EGD) WITH PROPOFOL N/A 04/15/2018   Procedure: ESOPHAGOGASTRODUODENOSCOPY (EGD) WITH PROPOFOL;  Surgeon: Lollie Sails, MD;  Location: [facility] ENDOSCOPY;  Service: Endoscopy;  Laterality: N/A;   ESOPHAGUS SURGERY     CLOSURE   EYE SURGERY     HYSTERECTOMY ABDOMINAL WITH SALPINGECTOMY     IR GASTROSTOMY TUBE MOD SED  06/11/2018   IR GASTROSTOMY TUBE REMOVAL  03/25/2019   IR REPLACE G-TUBE SIMPLE WO FLUORO  10/19/2020   PERIPHERAL VASCULAR CATHETERIZATION N/A 01/23/2016   Procedure: Visceral Venography;  Surgeon: Algernon Huxley, MD;  Location: [place] CV LAB;  Service: Cardiovascular;  Laterality:  N/A;   PERIPHERAL VASCULAR CATHETERIZATION  01/23/2016   Procedure: Peripheral Vascular Intervention;  Surgeon: Algernon Huxley, MD;  Location: [place] CV LAB;  Service: Cardiovascular;;   UPPER ESOPHAGEAL ENDOSCOPIC ULTRASOUND (EUS) N/A 12/05/2017   Procedure: UPPER ESOPHAGEAL ENDOSCOPIC ULTRASOUND (EUS);  Surgeon: Jola Schmidt, MD;  Location: [facility] ENDOSCOPY;  Service: Endoscopy;  Laterality: N/A;   VISCERAL ANGIOGRAPHY N/A 03/18/2017   Procedure: Visceral Angiography;  Surgeon: Algernon Huxley, MD;  Location: [place] CV LAB;  Service: Cardiovascular;  Laterality: N/A;   VISCERAL ARTERY INTERVENTION N/A 03/18/2017   Procedure: Visceral Artery Intervention;  Surgeon: Algernon Huxley, MD;  Location: [place] CV LAB;  Service: Cardiovascular;  Laterality: N/A;    Prior to Admission medications    Medication Sig Start Date End Date Taking? Authorizing Provider  acetaminophen (TYLENOL) 160 MG/5ML solution Place 20.3 mLs (650 mg total) into feeding tube every 6 (six) hours as needed for mild pain. 06/13/18   Demetrios Loll, MD  allopurinol (ZYLOPRIM) 100 MG tablet Take by mouth. 05/26/21 11/22/21  [provider]  Amino Acids-Protein Hydrolys (PRO-STAT) LIQD Take 8 oz via feeding tube 4 (four) times daily 06/13/18   [provider]  ascorbic acid (VITAMIN C) 250 MG tablet Take 1 tablet by G tube 2 (two) times daily 06/13/18   [provider]  aspirin 81 MG chewable tablet Place 1 tablet (81 mg total) into feeding tube daily. 12/22/20   Regalado, Belkys A, MD  Bromfenac Sodium 0.09 % SOLN Place 1 drop into the left eye 2 (two) times daily. 06/15/21   [provider]  carvedilol (COREG) 3.125 MG tablet Place 1 tablet (3.125 mg total) into feeding tube 2 (two) times daily with a meal. 12/22/20   Regalado, Belkys A, MD  clopidogrel (PLAVIX) 75 MG tablet GIVE 1 TABLET VIA FEEDING TUBE EVERY DAY AS DIRECTED 12/30/20   Love, Ivan Anchors, PA-C  clopidogrel (PLAVIX) 75 MG tablet Take 1 tablet (75 mg total) by G tube once daily 04/11/21   [provider]  furosemide (LASIX) 20 MG tablet Place 0.5 tablets (10 mg total) into feeding tube daily. Patient taking differently: Place 20 mg into feeding tube 2 (two) times daily. 12/30/20   Love, Ivan Anchors, PA-C  gentamicin ointment (GARAMYCIN) 0.1 % Apply topically. 03/13/21   [provider]  hydrocerin (EUCERIN) CREA Apply 1 application topically 2 (two) times daily. To bilateral feet 12/27/20   Love, Ivan Anchors, PA-C  Hypromellose 0.2 % SOLN Place 1 drop into both eyes 3 (three) times daily as needed (dry eyes).    [provider]  lisinopril (ZESTRIL) 2.5 MG tablet Take 1 tablet (2.5 mg total) by mouth daily. 12/31/20   Love, Ivan Anchors, PA-C  loperamide (IMODIUM) 1 MG/5ML solution Take 1 mg by mouth as needed for diarrhea  or loose stools.    [provider]  metFORMIN (GLUCOPHAGE) 500 MG tablet Place 1 tablet (500 mg total) into feeding tube 2 (two) times daily with a meal. 02/17/21   Fritzi Mandes, MD  Multiple Vitamin (MULTIVITAMIN) LIQD Place 15 mLs into feeding tube daily. 06/13/18   Demetrios Loll, MD  Nutritional Supplements (FEEDING SUPPLEMENT, KATE FARMS STANDARD 1.4,) LIQD liquid Place 325 mLs into feeding tube 4 (four) times daily. Patient taking differently: Place 325 mLs into feeding tube 3 (three) times daily between meals. 12/30/20   Love, Ivan Anchors, PA-C  pantoprazole sodium (PROTONIX) 40 mg/20 mL PACK Place 20 mLs (40 mg total) into  feeding tube daily. 12/30/20   Love, Ivan Anchors, PA-C  polyethylene glycol (MIRALAX / GLYCOLAX) 17 g packet Place 17 g into feeding tube daily as needed. 12/30/20   Love, Ivan Anchors, PA-C  rOPINIRole (REQUIP) 2 MG tablet Place 1 tablet (2 mg total) into feeding tube 2 (two) times daily. 12/30/20   Love, Ivan Anchors, PA-C  Water For Irrigation, Sterile (FREE WATER) SOLN Place 100 mLs into feeding tube every 6 (six) hours. 12/30/20   Love, Ivan Anchors, PA-C    Allergies Elemental sulfur, Gabapentin, Lipitor [atorvastatin], Mevacor [lovastatin], Milk-related compounds, Septra [sulfamethoxazole-trimethoprim], Statins, and Zocor [simvastatin]  Family History  Problem Relation Age of Onset   Stroke Mother    Hypertension Mother    Heart disease Mother    Cancer Father    Heart disease Father    Heart attack Sister    Diabetes Sister    Heart attack Brother    Thyroid cancer Daughter    Cancer - Colon Daughter     Social History Social History   Tobacco Use   Smoking status: Never   Smokeless tobacco: Never  Vaping Use   Vaping Use: Never used  Substance Use Topics   Alcohol use: Never   Drug use: Never    Review of Systems Level 5 caveat:  history/ROS limited by acute/critical illness   Review of systems most notable for a cute onset shortness of breath and chest  pain ____________________________________________   PHYSICAL EXAM:  VITAL SIGNS: ED Triage Vitals  Enc Vitals Group     BP 07/25/21 2254 (!) 123/54     Pulse Rate 07/25/21 2254 78     Resp 07/25/21 2254 16     Temp 07/25/21 2254 97.8 F (36.6 C)     Temp Source 07/25/21 2254 Oral     SpO2 07/25/21 2237 94 %     Weight 07/25/21 2254 50.8 kg (112 lb)     Height 07/25/21 2254 1.651 m (5\' 5" )     Head Circumference --      Peak Flow --      Pain Score 07/25/21 2254 0     Pain Loc --      Pain Edu? --      Excl. in [place]? --     Constitutional: Awake and alert.  Appears uncomfortable. Eyes: Conjunctivae are normal.  Head: Atraumatic. Nose: No congestion/rhinnorhea. Mouth/Throat: Patient is wearing a mask. Neck: No stridor.  No meningeal signs.   Cardiovascular: Normal rate, regular rhythm. Good peripheral circulation. Respiratory: Normal respiratory effort.  No retractions. Gastrointestinal: Soft and nontender. No distention.  Musculoskeletal: No lower extremity tenderness nor edema. No gross deformities of extremities. Neurologic:  Normal speech and language. No gross focal neurologic deficits are appreciated although the daughter reports that the patient has some chronic deficits on the right side after a relatively recent CVA. Skin:  Skin is warm, dry and intact.   ____________________________________________   LABS (all labs ordered are listed, but only abnormal results are displayed)  Labs Reviewed  CBC - Abnormal; Notable for the following components:      Result Value   RBC 2.64 (*)    Hemoglobin 8.3 (*)    HCT 25.1 (*)    All other components within normal limits  BASIC METABOLIC PANEL - Abnormal; Notable for the following components:   Sodium 132 (*)    Chloride 97 (*)    Glucose, Bld 224 (*)    BUN 56 (*)  Creatinine, Ser 1.24 (*)    Calcium 8.8 (*)    GFR, Estimated 42 (*)    All other components within normal limits  BRAIN NATRIURETIC PEPTIDE -  Abnormal; Notable for the following components:   B Natriuretic Peptide 1,541.0 (*)    All other components within normal limits  TROPONIN I (HIGH SENSITIVITY) - Abnormal; Notable for the following components:   Troponin I (High Sensitivity) 186 (*)    All other components within normal limits  TROPONIN I (HIGH SENSITIVITY) - Abnormal; Notable for the following components:   Troponin I (High Sensitivity) 225 (*)    All other components within normal limits  RESP PANEL BY RT-PCR (FLU A&B, COVID) ARPGX2  PROTIME-INR  APTT  HEPARIN LEVEL (UNFRACTIONATED)   ____________________________________________  EKG  ED ECG REPORT I, Hinda Kehr, the attending physician, personally viewed and interpreted this ECG.  Date: 07/25/2021 EKG Time: 22: 51 Rate: 78 Rhythm: normal sinus rhythm QRS Axis: normal Intervals: LVH with repolarization abnormality ST/T Wave abnormalities: Non-specific ST segment / T-wave changes, but no clear evidence of acute ischemia. Narrative Interpretation: no definitive evidence of acute ischemia; does not meet STEMI criteria.  ____________________________________________  RADIOLOGY I, Hinda Kehr, personally viewed and evaluated these images (plain radiographs) as part of my medical decision making, as well as reviewing the written report by the radiologist.  ED MD interpretation: Cardiomegaly, small pleural effusions, mild interstitial edema  Official radiology report(s): DG Chest 2 View  Result Date: 07/25/2021 CLINICAL DATA:  Shortness of breath EXAM: CHEST - 2 VIEW COMPARISON:  02/15/2021, 11/23/2019 FINDINGS: Cardiomegaly with vascular congestion and mild interstitial pulmonary edema. Small bilateral pleural effusions. Aortic atherosclerosis. No pneumothorax. IMPRESSION: Cardiomegaly with small pleural effusions and mild interstitial edema. Electronically Signed   By: Donavan Foil M.D.   On: 07/25/2021 23:14     ____________________________________________   PROCEDURES   Procedure(s) performed (including Critical Care):  .1-3 Lead EKG Interpretation Performed by: Hinda Kehr, MD Authorized by: Hinda Kehr, MD     Interpretation: normal     ECG rate:  75   ECG rate assessment: normal     Rhythm: sinus rhythm     Ectopy: none     Conduction: normal   .Critical Care Performed by: Hinda Kehr, MD Authorized by: Hinda Kehr, MD   Critical care provider statement:    Critical care time (minutes):  45   Critical care time was exclusive of:  Separately billable procedures and treating other patients   Critical care was necessary to treat or prevent imminent or life-threatening deterioration of the following conditions:  Cardiac failure and circulatory failure   Critical care was time spent personally by me on the following activities:  Development of treatment plan with patient or surrogate, evaluation of patient's response to treatment, examination of patient, obtaining history from patient or surrogate, ordering and performing treatments and interventions, ordering and review of laboratory studies, ordering and review of radiographic studies, pulse oximetry, re-evaluation of patient's condition and review of old charts   ____________________________________________   INITIAL IMPRESSION / MDM / [facility] / ED COURSE  As part of my medical decision making, I reviewed the following data within the electronic MEDICAL RECORD NUMBER History obtained from family, Nursing notes reviewed and incorporated, Labs reviewed , EKG interpreted , Old chart reviewed, Radiograph reviewed , Discussed with admitting physician , and Notes from prior ED visits   Differential diagnosis includes, but is not limited to, CHF exacerbation, ACS, acute infection such  as pneumonia, less likely PE.  The patient is on the cardiac monitor to evaluate for evidence of arrhythmia and/or significant heart rate  changes.  Generally reassuring EKG.  Vital signs are stable and within normal limits except for an SPO2 of about 98%.  I put her on 2 L of oxygen for comfort.  I personally reviewed the patient's imaging and agree with the radiologist's interpretation that there is some interstitial edema and pleural effusions, which suggests CHF exacerbation as the proximal cause of her symptoms.  The patient had an initial high-sensitivity troponin that was elevated at about 180; she has had similarly elevated troponins in the past, but her second troponin went up  to 225.  Patient is having some ongoing pain.  Basic metabolic panel is essentially normal.  CBC is generally reassuring other than a hemoglobin of 8.3.  BNP is chronically elevated at 1541.  Respiratory viral panel negative.  I spoke with the patient and her daughter and discussed my concerns.  I am starting heparin and gave an initial dose of furosemide 20 mg IV.  I will discussed the case with the hospitalist for further admission for CHF exacerbation and probable NSTEMI.  Patient has already received aspirin and Plavix at home.      ____________________________________________  FINAL CLINICAL IMPRESSION(S) / ED DIAGNOSES  Final diagnoses:  Chest pain, unspecified type  Acute dyspnea  Acute on chronic congestive heart failure, unspecified heart failure type (HCC)  NSTEMI (non-ST elevated myocardial infarction) ([place])     MEDICATIONS GIVEN DURING THIS VISIT:  Medications  heparin ADULT infusion 100 units/mL (25000 units/211mL) (600 Units/hr Intravenous New Bag/Given 07/26/21 0232)  furosemide (LASIX) injection 20 mg (20 mg Intravenous Given 07/26/21 0128)  heparin bolus via infusion 3,000 Units (3,000 Units Intravenous Bolus from Bag 07/26/21 0232)     ED Discharge Orders     None        Note:  This document was prepared using Dragon voice recognition software and may include unintentional dictation errors.   Hinda Kehr,  MD 07/26/21 [phone]

## 2021-07-26 NOTE — Progress Notes (Addendum)
Initial Nutrition Assessment  DOCUMENTATION CODES:   Not applicable  INTERVENTION:   Initiate bolus feedings:    325 ml (1 carton) Dillard [place] Standard 1.4 QID via PEG  75 ml free water flush before and after each feeding administration  Tube feeding regimen provides 1820 kcal (100% of needs), 80 grams of protein, and 936 ml of H2O. Total free water: 1536 ml daily  NUTRITION DIAGNOSIS:   Inadequate oral intake related to decreased appetite as evidenced by meal completion < 25% (PEG dependent, eats for pleasure only).  GOAL:   Patient will meet greater than or equal to 90% of their needs  MONITOR:   PO intake, Labs, Weight trends, TF tolerance, Skin, I & O's  REASON FOR ASSESSMENT:   Consult Enteral/tube feeding initiation and management  ASSESSMENT:   [name] is a 87 y.o. female with medical history significant for HTN, DM, CVA stroke, gastrostomy secondary to esophageal stricture, combined CHF, CKD 3a and aortic stenosis who presents with a 1 [name] history of fatigue with onset of chest pain and shortness of breath after going to bed prompting the visit to the ED.  She has had no cough fever or chills, no nausea vomiting abdominal pain or diarrhea.  chest pain was difficult for her to describe.  History provided mostly by daughter at bedside  Pt admitted with CHF.   Reviewed I/O's: +62 ml x 24 hours  Case discussed with RN; pt is currently being held in the ED and awaiting bed placement. MD is requesting initiation of TF.   Per discussion with RN, pt is PEG dependent and uses Dillard Essex at home (per previous RD notes, pt with intolerance to Osmolite and Jevity formulas). Pt had been trialed on Dillard Essex on a prior hospital admission. Per RN, pt has been tolerating Dillard Essex TF well, as she is highly sensitive to dairy products. Home regimen is usually "two big feedings" per [name] and 1/2 a carton 1-2 times per [name] to supplement. Pt currently on a heart healthy/ carb  modified diet, but takes the majority of her nutrition via PEG. PO intake is for pleasure only.    Reviewed wt hx; pt has experienced a 4.3% wt loss over the past 6 months, which is not significant for time frame.   Noted pt with history of moderate malnutrition in the context of chronic illness. RD suspects that malnutrition is ongoing, however, unable to identify at this time.   Lab Results  Component Value Date   HGBA1C 6.9 (H) 12/18/2020   PTA DM medications are 2.5 mg glipizide BID. Per ADA's Standards of Medical Care of Diabetes, glycemic targets for older adults who have multiple co-morbidities, cognitive impairments, and functional dependence should be less stringent (Hgb A1c <8.0-8.5).     Labs reviewed: Na: 132, CBGS: 95 (inpatient orders for glycemic control are 0-9 units insulin aspart daily at bedtime and 0-9 units insulin aspart TID with meals).    Diet Order:   Diet Order             Diet heart healthy/carb modified Room service appropriate? Yes; Fluid consistency: Thin  Diet effective now                   EDUCATION NEEDS:   No education needs have been identified at this time  Skin:  Skin Assessment: Skin Integrity Issues: Skin Integrity Issues:: Stage I Stage I: sacrum  Last BM:  07/25/21  Height:   Ht  Readings from Last 1 Encounters:  07/25/21 5\' 5"  (1.651 m)    Weight:   Wt Readings from Last 1 Encounters:  07/25/21 50.8 kg    Ideal Body Weight:  56.8 kg  BMI:  Body mass index is 18.64 kg/m.  Estimated Nutritional Needs:   Kcal:  1500-1700  Protein:  75-90 grams  Fluid:  > 1.5 L    [name], RD, LDN, [place] Registered Dietitian II Certified Diabetes Care and Education Specialist Please refer to [facility] for RD and/or RD on-call/weekend/after hours pager

## 2021-07-26 NOTE — Progress Notes (Signed)
ANTICOAGULATION CONSULT NOTE  Pharmacy Consult for Heparin  Indication: chest pain/ACS  Allergies  Allergen Reactions   Elemental Sulfur Diarrhea and Nausea And Vomiting   Gabapentin Swelling   Lipitor [Atorvastatin] Other (See Comments)    Muscle aches   Mevacor [Lovastatin] Other (See Comments)    Muscle aches   Milk-Related Compounds Other (See Comments)    Large quantities cause headaches    Septra [Sulfamethoxazole-Trimethoprim] Other (See Comments)    Unknown   Statins Other (See Comments)    Muscle pain   Zocor [Simvastatin] Other (See Comments)    Muscle aches    Patient Measurements: Height: 5\' 5"  (165.1 cm) Weight: 50.8 kg (112 lb) IBW/kg (Calculated) : 57 Heparin Dosing Weight: 50.8 kg   Vital Signs: BP: 130/45 (10/26 1900) Pulse Rate: 65 (10/26 1906)  Labs: Recent Labs    07/25/21 2258 07/26/21 0055 07/26/21 1152 07/26/21 2120  HGB 8.3*  --   --   --   HCT 25.1*  --   --   --   PLT 305  --   --   --   APTT  --  27  --   --   LABPROT  --  12.8  --   --   INR  --  1.0  --   --   HEPARINUNFRC  --   --  0.27* 0.27*  CREATININE 1.24*  --   --   --   TROPONINIHS 186* 225*  --   --      Estimated Creatinine Clearance: 24.7 mL/min (A) (by C-G formula based on SCr of 1.24 mg/dL (H)).   Medical History: Past Medical History:  Diagnosis Date   Anemia    IRON INFUSIONS   Aortic valve sclerosis    Arrhythmia    Arthritis    osteoarthritis   Basal cell carcinoma of skin    Brain tumor ([place])    Brain tumor ([place])    Cervical spine disease    CHF (congestive heart failure) (HCC)    Diabetes mellitus without complication (HCC)    Dysrhythmia    sinus arrhythmia   Esophageal stricture    severe, led to feeding tube placement in Sept 2019   Esophageal ulcer without bleeding    Gastrostomy tube dependent ([place])    DOES NOT EAT OR DRINK    GERD (gastroesophageal reflux disease)    Hearing loss    pt had hearing test  last month, left ear30% hearing  due to stroke, 78% in right-normal for her age   History of kidney stones    Hyperlipemia    Hypertension    Kidney stones    Leaky heart valve    Meningioma (HCC)    Occlusive mesenteric ischemia (HCC)    Pulmonary hypertension (HCC)    RLS (restless legs syndrome)    Stroke [facility])    Vertigo      Assessment: Pharmacy consulted to dose heparin in this 87 year old female admitted with ACS/NSTEMI.  No prior anticoag noted.  CrCl = 24.7 ml/min    Goal of Therapy:  Heparin level 0.3-0.7 units/ml Monitor platelets by anticoagulation protocol: Yes   Plan:  Heparin subtherapeutic  Heparin 700 unit bolus Increase heparin infusion to 800 units/hr Check HL 10/27 at 0700 Continue to monitor H&H and platelets  Cyrstal Leitz Burna Mortimer, PharmD, BCPS Clinical Pharmacist   07/26/2021,9:46 PM

## 2021-07-26 NOTE — ED Notes (Signed)
Lab called for heparin blood draw at this time. Confirmed that they were sending someone over.

## 2021-07-27 ENCOUNTER — Other Ambulatory Visit: Payer: Self-pay

## 2021-07-27 DIAGNOSIS — N1831 Chronic kidney disease, stage 3a: Secondary | ICD-10-CM

## 2021-07-27 DIAGNOSIS — I10 Essential (primary) hypertension: Secondary | ICD-10-CM

## 2021-07-27 DIAGNOSIS — R778 Other specified abnormalities of plasma proteins: Secondary | ICD-10-CM

## 2021-07-27 DIAGNOSIS — R531 Weakness: Secondary | ICD-10-CM

## 2021-07-27 LAB — BASIC METABOLIC PANEL
Anion gap: 8 (ref 5–15)
BUN: 50 mg/dL — ABNORMAL HIGH (ref 8–23)
CO2: 29 mmol/L (ref 22–32)
Calcium: 8.6 mg/dL — ABNORMAL LOW (ref 8.9–10.3)
Chloride: 97 mmol/L — ABNORMAL LOW (ref 98–111)
Creatinine, Ser: 1 mg/dL (ref 0.44–1.00)
GFR, Estimated: 54 mL/min — ABNORMAL LOW (ref >60)
Glucose, Bld: 135 mg/dL — ABNORMAL HIGH (ref 70–99)
Potassium: 4.8 mmol/L (ref 3.5–5.1)
Sodium: 134 mmol/L — ABNORMAL LOW (ref 135–145)

## 2021-07-27 LAB — CBG MONITORING, ED
Glucose-Capillary: 154 mg/dL — ABNORMAL HIGH (ref 70–99)
Glucose-Capillary: 257 mg/dL — ABNORMAL HIGH (ref 70–99)
Glucose-Capillary: 70 mg/dL (ref 70–99)
Glucose-Capillary: 206 mg/dL — ABNORMAL HIGH (ref 70–99)

## 2021-07-27 LAB — CBC
HCT: 28.2 % — ABNORMAL LOW (ref 36.0–46.0)
Hemoglobin: 9.1 g/dL — ABNORMAL LOW (ref 12.0–15.0)
MCH: 30.2 pg (ref 26.0–34.0)
MCHC: 32.3 g/dL (ref 30.0–36.0)
MCV: 93.7 fL (ref 80.0–100.0)
Platelets: 314 10*3/uL (ref 150–400)
RBC: 3.01 MIL/uL — ABNORMAL LOW (ref 3.87–5.11)
RDW: 14.5 % (ref 11.5–15.5)
WBC: 10.7 10*3/uL — ABNORMAL HIGH (ref 4.0–10.5)
nRBC: 0 % (ref 0.0–0.2)

## 2021-07-27 LAB — BRAIN NATRIURETIC PEPTIDE: B Natriuretic Peptide: 1721.6 pg/mL — ABNORMAL HIGH (ref 0.0–100.0)

## 2021-07-27 LAB — HEPARIN LEVEL (UNFRACTIONATED): Heparin Unfractionated: 0.33 IU/mL (ref 0.30–0.70)

## 2021-07-27 MED ORDER — ASPIRIN 81 MG PO CHEW
81.0000 mg | CHEWABLE_TABLET | Freq: Every day | ORAL | Status: DC
  Administered 2021-07-27: 81 mg
  Filled 2021-07-27: qty 1

## 2021-07-27 MED ORDER — FUROSEMIDE 10 MG/ML IJ SOLN
20.0000 mg | Freq: Two times a day (BID) | INTRAMUSCULAR | Status: DC
  Administered 2021-07-27: 20 mg via INTRAVENOUS
  Filled 2021-07-27: qty 4

## 2021-07-27 MED ORDER — CLOPIDOGREL BISULFATE 75 MG PO TABS
75.0000 mg | ORAL_TABLET | Freq: Every day | ORAL | Status: DC
  Administered 2021-07-27: 75 mg via ORAL

## 2021-07-27 NOTE — Progress Notes (Signed)
[facility]    SUBJECTIVE: The patient reports feeling better. She reports that her breathing is better and denies chest pain.   Vitals:   07/27/21 0530 07/27/21 0600 07/27/21 0630 07/27/21 0700  BP: 124/62 126/67 (!) 146/49 140/66  Pulse: 75 81 (!) 101 84  Resp: 19 (!) 22 19 (!) 25  Temp:      TempSrc:      SpO2: 100% 100% 97% 99%  Weight:      Height:        No intake or output data in the 24 hours ending 07/27/21 1106    PHYSICAL EXAM  General: Frail, elderly female in no acute distress HEENT:  Normocephalic and atramatic Neck:   No JVD.  Lungs: normal effort of breathing on room air, no wheezing, slightly decreased breath sounds Heart: HRRR . Normal S1 and S2 without gallops or murmurs.  Extremities: No clubbing, cyanosis or edema.   Neuro: Alert and oriented X 3. Psych:  Good affect, responds appropriately   LABS: Basic Metabolic Panel: Recent Labs    07/25/21 2258 07/27/21 0635  NA 132* 134*  K 4.9 4.8  CL 97* 97*  CO2 28 29  GLUCOSE 224* 135*  BUN 56* 50*  CREATININE 1.24* 1.00  CALCIUM 8.8* 8.6*   Liver Function Tests: No results for input(s): AST, ALT, ALKPHOS, BILITOT, PROT, ALBUMIN in the last 72 hours. No results for input(s): LIPASE, AMYLASE in the last 72 hours. CBC: Recent Labs    07/25/21 2258 07/27/21 0635  WBC 6.7 10.7*  HGB 8.3* 9.1*  HCT 25.1* 28.2*  MCV 95.1 93.7  PLT 305 314   Cardiac Enzymes: No results for input(s): CKTOTAL, CKMB, CKMBINDEX, TROPONINI in the last 72 hours. BNP: Invalid input(s): POCBNP D-Dimer: No results for input(s): DDIMER in the last 72 hours. Hemoglobin A1C: Recent Labs    07/26/21 0623  HGBA1C 5.3   Fasting Lipid Panel: No results for input(s): CHOL, HDL, LDLCALC, TRIG, CHOLHDL, LDLDIRECT in the last 72 hours. Thyroid Function Tests: No results for input(s): TSH, T4TOTAL, T3FREE, THYROIDAB in the last 72 hours.  Invalid input(s): FREET3 Anemia Panel: No results for input(s): VITAMINB12,  FOLATE, FERRITIN, TIBC, IRON, RETICCTPCT in the last 72 hours.  DG Chest 2 View  Result Date: 07/25/2021 CLINICAL DATA:  Shortness of breath EXAM: CHEST - 2 VIEW COMPARISON:  02/15/2021, 11/23/2019 FINDINGS: Cardiomegaly with vascular congestion and mild interstitial pulmonary edema. Small bilateral pleural effusions. Aortic atherosclerosis. No pneumothorax. IMPRESSION: Cardiomegaly with small pleural effusions and mild interstitial edema. Electronically Signed   By: Donavan Foil M.D.   On: 07/25/2021 23:14   ECHOCARDIOGRAM COMPLETE  Result Date: 07/26/2021    ECHOCARDIOGRAM REPORT   Patient Name:   [name] Date of Exam: 07/26/2021 Medical Rec #:  [id number]        Height:       65.0 in Accession #:    [id number]       Weight:       112.0 lb Date of Birth:  07-09-1929       BSA:          1.546 m Patient Age:    87 years         BP:           118/42 mmHg Patient Gender: F                HR:           70 bpm.  Exam Location:  [facility] Procedure: 2D Echo, Cardiac Doppler, Color Doppler and Strain Analysis Indications:     CHF--acute diastolic H70.26  History:         Patient has prior history of Echocardiogram examinations, most                  recent 12/18/2020. CHF; Risk Factors:Diabetes.  Sonographer:     Sherrie Sport Referring Phys:  [id number] Athena Masse Diagnosing Phys: Kate Sable MD  Sonographer Comments: Global longitudinal strain was attempted. IMPRESSIONS  1. Left ventricular ejection fraction, by estimation, is 40 to 45%. The left ventricle has mildly decreased function. The left ventricle demonstrates regional wall motion abnormalities (see scoring diagram/findings for description). There is mild left ventricular hypertrophy. Left ventricular diastolic parameters are consistent with Grade II diastolic dysfunction (pseudonormalization). There is moderate hypokinesis of the left ventricular, entire inferior segment and inferolateral wall. The average left ventricular global longitudinal  strain is -10.7 %. The global longitudinal strain is abnormal.  2. Right ventricular systolic function is low normal. The right ventricular size is normal.  3. Left atrial size was severely dilated.  4. Right atrial size was mildly dilated.  5. A small pericardial effusion is present.  6. The mitral valve is normal in structure. Moderate to severe mitral valve regurgitation.  7. The aortic valve is calcified. Aortic valve regurgitation is mild. Mild to moderate aortic valve stenosis.  8. The inferior vena cava is dilated in size with <50% respiratory variability, suggesting right atrial pressure of 15 mmHg. FINDINGS  Left Ventricle: Left ventricular ejection fraction, by estimation, is 40 to 45%. The left ventricle has mildly decreased function. The left ventricle demonstrates regional wall motion abnormalities. Moderate hypokinesis of the left ventricular, entire inferior segment and inferolateral wall. The average left ventricular global longitudinal strain is -10.7 %. The global longitudinal strain is abnormal. The left ventricular internal cavity size was normal in size. There is mild left ventricular hypertrophy. Left ventricular diastolic parameters are consistent with Grade II diastolic dysfunction (pseudonormalization). Right Ventricle: The right ventricular size is normal. No increase in right ventricular wall thickness. Right ventricular systolic function is low normal. Left Atrium: Left atrial size was severely dilated. Right Atrium: Right atrial size was mildly dilated. Pericardium: A small pericardial effusion is present. Mitral Valve: The mitral valve is normal in structure. Moderate to severe mitral valve regurgitation. MV peak gradient, 11.0 mmHg. The mean mitral valve gradient is 4.0 mmHg. Tricuspid Valve: The tricuspid valve is normal in structure. Tricuspid valve regurgitation is mild. Aortic Valve: The aortic valve is calcified. Aortic valve regurgitation is mild. Aortic regurgitation PHT  measures 350 msec. Mild to moderate aortic stenosis is present. Aortic valve mean gradient measures 15.5 mmHg. Aortic valve peak gradient measures 25.6 mmHg. Aortic valve area, by VTI measures 0.47 cm. Pulmonic Valve: The pulmonic valve was not well visualized. Pulmonic valve regurgitation is mild. Aorta: The aortic root is normal in size and structure. Venous: The inferior vena cava is dilated in size with less than 50% respiratory variability, suggesting right atrial pressure of 15 mmHg. IAS/Shunts: No atrial level shunt detected by color flow Doppler.  LEFT VENTRICLE PLAX 2D LVIDd:         5.80 cm      Diastology LVIDs:         4.70 cm      LV e' medial:    3.81 cm/s LV PW:         1.00 cm  LV E/e' medial:  33.6 LV IVS:        1.00 cm      LV e' lateral:   3.59 cm/s LVOT diam:     2.00 cm      LV E/e' lateral: 35.7 LV SV:         30 LV SV Index:   19           2D Longitudinal Strain LVOT Area:     3.14 cm     2D Strain GLS Avg:     -10.7 %  LV Volumes (MOD) LV vol d, MOD A2C: 122.0 ml 3D Volume EF: LV vol d, MOD A4C: 112.0 ml 3D EF:        43 % LV vol s, MOD A2C: 84.7 ml  LV EDV:       182 ml LV vol s, MOD A4C: 60.5 ml  LV ESV:       104 ml LV SV MOD A2C:     37.3 ml  LV SV:        78 ml LV SV MOD A4C:     112.0 ml LV SV MOD BP:      46.6 ml RIGHT VENTRICLE RV Basal diam:  3.40 cm RV S prime:     10.00 cm/s TAPSE (M-mode): 4.3 cm LEFT ATRIUM              Index        RIGHT ATRIUM           Index LA diam:        3.90 cm  2.52 cm/m   RA Area:     18.20 cm LA Vol (A2C):   62.1 ml  40.17 ml/m  RA Volume:   53.10 ml  34.34 ml/m LA Vol (A4C):   115.0 ml 74.38 ml/m LA Biplane Vol: 93.2 ml  60.28 ml/m  AORTIC VALVE                     PULMONIC VALVE AV Area (Vmax):    0.53 cm      PV Vmax:        0.57 m/s AV Area (Vmean):   0.49 cm      PV Vmean:       39.700 cm/s AV Area (VTI):     0.47 cm      PV VTI:         0.112 m AV Vmax:           253.00 cm/s   PV Peak grad:   1.3 mmHg AV Vmean:          186.500  cm/s  PV Mean grad:   1.0 mmHg AV VTI:            0.624 m       RVOT Peak grad: 2 mmHg AV Peak Grad:      25.6 mmHg AV Mean Grad:      15.5 mmHg LVOT Vmax:         42.40 cm/s LVOT Vmean:        28.800 cm/s LVOT VTI:          0.094 m LVOT/AV VTI ratio: 0.15 AI PHT:            350 msec  AORTA Ao Root diam: 2.60 cm MITRAL VALVE                TRICUSPID VALVE MV Area (PHT): 3.39 cm  TR Peak grad:   46.8 mmHg MV Area VTI:   0.79 cm     TR Vmax:        342.00 cm/s MV Peak grad:  11.0 mmHg MV Mean grad:  4.0 mmHg     SHUNTS MV Vmax:       1.66 m/s     Systemic VTI:  0.09 m MV Vmean:      91.9 cm/s    Systemic Diam: 2.00 cm MV Decel Time: 224 msec     Pulmonic VTI:  0.144 m MV E velocity: 128.00 cm/s MV A velocity: 63.80 cm/s MV E/A ratio:  2.01 Kate Sable MD Electronically signed by Kate Sable MD Signature Date/Time: 07/26/2021/5:21:44 PM    Final      Echo 40-45%, moderate to severe MR  TELEMETRY: sinus rhythm, 80s  ASSESSMENT AND PLAN:  Active Problems:   Type 2 diabetes mellitus with hyperlipidemia (HCC)   Hypertension   Esophageal stricture   Gastrostomy tube dependent (HCC)   Right sided weakness   Chronic anemia   Acute on chronic combined systolic and diastolic CHF (congestive heart failure) (HCC)   NSTEMI (non-ST elevated myocardial infarction) ([place])   Stage 3a chronic kidney disease (HCC)   History of CVA (cerebrovascular accident)    1.  Elevated troponin (186, 225), in the absence of chest pain, with nondiagnostic ECG, in the setting of acute on chronic combined diastolic and systolic congestive heart failure, likely demand supply ischemia 2.  Acute on chronic combined systolic and diastolic congestive heart failure, BNP 1541, chest x-ray revealing mild interstitial edema, clinically improved after initial diuresis. Patient reports feeling much better, denies chest pain or shortness of breath, oxygen saturation 97% on 2 L by nasal cannula. 3.  Anemia, hemoglobin 9.1, on  aspirin and Plavix, currently being held, on heparin drip 4.  CVA, residual right-sided weakness, on aspirin and Plavix    Plan: Discontinue heparin drip Resume aspirin and Plavix Add IV Lasix 20 mg BID  Continue lisinopril and carvedilol   Clabe Seal, PA-C 07/27/2021 11:06 AM

## 2021-07-27 NOTE — ED Notes (Signed)
Care transferred, no report received from Barbie Haggis, RN

## 2021-07-27 NOTE — Progress Notes (Signed)
ANTICOAGULATION CONSULT NOTE  Pharmacy Consult for Heparin  Indication: chest pain/ACS  Allergies  Allergen Reactions   Elemental Sulfur Diarrhea and Nausea And Vomiting   Gabapentin Swelling   Lipitor [Atorvastatin] Other (See Comments)    Muscle aches   Mevacor [Lovastatin] Other (See Comments)    Muscle aches   Milk-Related Compounds Other (See Comments)    Large quantities cause headaches    Septra [Sulfamethoxazole-Trimethoprim] Other (See Comments)    Unknown   Statins Other (See Comments)    Muscle pain   Zocor [Simvastatin] Other (See Comments)    Muscle aches    Patient Measurements: Height: 5\' 5"  (165.1 cm) Weight: 50.8 kg (112 lb) IBW/kg (Calculated) : 57 Heparin Dosing Weight: 50.8 kg   Vital Signs: BP: 140/66 (10/27 0700) Pulse Rate: 84 (10/27 0700)  Labs: Recent Labs    07/25/21 2258 07/26/21 0055 07/26/21 1152 07/26/21 2120 07/27/21 0635  HGB 8.3*  --   --   --  9.1*  HCT 25.1*  --   --   --  28.2*  PLT 305  --   --   --  314  APTT  --  27  --   --   --   LABPROT  --  12.8  --   --   --   INR  --  1.0  --   --   --   HEPARINUNFRC  --   --  0.27* 0.27* 0.33  CREATININE 1.24*  --   --   --  1.00  TROPONINIHS 186* 225*  --   --   --      Estimated Creatinine Clearance: 30.6 mL/min (by C-G formula based on SCr of 1 mg/dL).   Medical History: Past Medical History:  Diagnosis Date   Anemia    IRON INFUSIONS   Aortic valve sclerosis    Arrhythmia    Arthritis    osteoarthritis   Basal cell carcinoma of skin    Brain tumor ([place])    Brain tumor ([place])    Cervical spine disease    CHF (congestive heart failure) (HCC)    Diabetes mellitus without complication (HCC)    Dysrhythmia    sinus arrhythmia   Esophageal stricture    severe, led to feeding tube placement in Sept 2019   Esophageal ulcer without bleeding    Gastrostomy tube dependent ([place])    DOES NOT EAT OR DRINK    GERD (gastroesophageal reflux disease)    Hearing loss    pt  had hearing test  last month, left ear30% hearing due to stroke, 78% in right-normal for her age   History of kidney stones    Hyperlipemia    Hypertension    Kidney stones    Leaky heart valve    Meningioma (HCC)    Occlusive mesenteric ischemia (HCC)    Pulmonary hypertension (HCC)    RLS (restless legs syndrome)    Stroke (HCC)    Vertigo     Medications:  (Not in a hospital admission)  Assessment: Pharmacy consulted to dose heparin in this 87 year old female admitted with ACS/NSTEMI.  No prior anticoag noted.   10/26 1152 HL= 0.27, subtherapeutic 600 > 700 units/hr 10/26 2120 HL 0.27  10/27 0635 HL 0.33 heparin at 800 units/hr.    Goal of Therapy:  Heparin level 0.3-0.7 units/ml Monitor platelets by anticoagulation protocol: Yes   Plan:  Heparin level is therapeutic. Will continue heparin infusion at 800 units/hr.  Recheck anti-xa leve lin 8 hours. CBC daily while on heparin. Plan to continue heparin for 24-48 hours.   Oswald Hillock, PharmD, BCPS Clinical Pharmacist   07/27/2021,7:36 AM

## 2021-07-27 NOTE — Progress Notes (Signed)
PROGRESS NOTE    [name] [name]  IPJ:[id number] DOB: [birth year] DOA: 07/25/2021 PCP: [name] Harrier, MD    Brief Narrative:  87 year old female referred for evaluation of acute on chronic combined systolic and diastolic congestive heart failure.  The patient was in her usual state of health till 1 [name] prior to admission she developed shortness of breath and pain all over.  The patient denies [place] chest pain.  Upon arrival to [name], ECG revealed sinus rhythm 78 bpm with LVH and repolarization changes.  X-ray revealed mild interstitial edema with small pleural effusions.  The patient was treated with intravenous furosemide with overall clinical improvement.  Admission labs notable for borderline elevated troponin (186, 225), and elevated BNP 1541.  10/27 starting to feel better.  Less short of breath.  Consultants:  Cardiology  Procedures:   Antimicrobials:      Subjective: No chest pain  Objective: Vitals:   07/27/21 0530 07/27/21 0600 07/27/21 0630 07/27/21 0700  BP: 124/62 126/67 (!) 146/49 140/66  Pulse: 75 81 (!) 101 84  Resp: 19 (!) 22 19 (!) 25  Temp:      TempSrc:      SpO2: 100% 100% 97% 99%  Weight:      Height:       No intake or output data in the 24 hours ending 07/27/21 0847 Filed Weights   07/25/21 2254  Weight: 50.8 kg    Examination:  General exam: Appears calm and comfortable  Respiratory system: Creased breath sounds no wheezing Cardiovascular system: S1 & S2 heard, RRR. No JVD, murmurs, rubs, gallops or clicks.  Gastrointestinal system: Abdomen is nondistended, soft and nontender. No organomegaly or masses felt. Normal bowel sounds heard. Central nervous system: Alert and oriented.  Grossly intact extremities: no edema Psychiatry: Judgement and insight appear normal. Mood & affect appropriate.     Data Reviewed: I have personally reviewed following labs and imaging studies  CBC: Recent Labs  Lab 07/25/21 2258 07/27/21 0635   WBC 6.7 10.7*  HGB 8.3* 9.1*  HCT 25.1* 28.2*  MCV 95.1 93.7  PLT 305 734   Basic Metabolic Panel: Recent Labs  Lab 07/25/21 2258 07/27/21 0635  NA 132* 134*  K 4.9 4.8  CL 97* 97*  CO2 28 29  GLUCOSE 224* 135*  BUN 56* 50*  CREATININE 1.24* 1.00  CALCIUM 8.8* 8.6*   GFR: Estimated Creatinine Clearance: 30.6 mL/min (by C-G formula based on SCr of 1 mg/dL). Liver Function Tests: No results for input(s): AST, ALT, ALKPHOS, BILITOT, PROT, ALBUMIN in the last 168 hours. No results for input(s): LIPASE, AMYLASE in the last 168 hours. No results for input(s): AMMONIA in the last 168 hours. Coagulation Profile: Recent Labs  Lab 07/26/21 0055  INR 1.0   Cardiac Enzymes: No results for input(s): CKTOTAL, CKMB, CKMBINDEX, TROPONINI in the last 168 hours. BNP (last 3 results) No results for input(s): PROBNP in the last 8760 hours. HbA1C: Recent Labs    07/26/21 0623  HGBA1C 5.3   CBG: Recent Labs  Lab 07/26/21 0749 07/26/21 1218 07/26/21 1820 07/26/21 2144 07/27/21 0723  GLUCAP 95 111* 125* 232* 154*   Lipid Profile: No results for input(s): CHOL, HDL, LDLCALC, TRIG, CHOLHDL, LDLDIRECT in the last 72 hours. Thyroid Function Tests: No results for input(s): TSH, T4TOTAL, FREET4, T3FREE, THYROIDAB in the last 72 hours. Anemia Panel: No results for input(s): VITAMINB12, FOLATE, FERRITIN, TIBC, IRON, RETICCTPCT in the last 72 hours. Sepsis Labs: No results for input(s):  PROCALCITON, LATICACIDVEN in the last 168 hours.  Recent Results (from the past 240 hour(s))  Resp Panel by RT-PCR (Flu A&B, Covid) Nasopharyngeal Swab     Status: None   Collection Time: 07/26/21 12:55 AM   Specimen: Nasopharyngeal Swab; Nasopharyngeal(NP) swabs in vial transport medium  Result Value Ref Range Status   SARS Coronavirus 2 by RT PCR NEGATIVE NEGATIVE Final    Comment: (NOTE) SARS-CoV-2 target nucleic acids are NOT DETECTED.  The SARS-CoV-2 RNA is generally detectable in upper  respiratory specimens during the acute phase of infection. The lowest concentration of SARS-CoV-2 viral copies this assay can detect is 138 copies/mL. A negative result does not preclude SARS-Cov-2 infection and should not be used as the sole basis for treatment or other patient management decisions. A negative result may occur with  improper specimen collection/handling, submission of specimen other than nasopharyngeal swab, presence of viral mutation(s) within the areas targeted by this assay, and inadequate number of viral copies(<138 copies/mL). A negative result must be combined with clinical observations, patient history, and epidemiological information. The expected result is Negative.  Fact Sheet for Patients:  EntrepreneurPulse.com.au  Fact Sheet for Healthcare Providers:  IncredibleEmployment.be  This test is no t yet approved or cleared by the [place] FDA and  has been authorized for detection and/or diagnosis of SARS-CoV-2 by FDA under an Emergency Use Authorization (EUA). This EUA will remain  in effect (meaning this test can be used) for the duration of the COVID-19 declaration under Section 564(b)(1) of the Act, 21 U.S.C.section 360bbb-3(b)(1), unless the authorization is terminated  or revoked sooner.       Influenza A by PCR NEGATIVE NEGATIVE Final   Influenza B by PCR NEGATIVE NEGATIVE Final    Comment: (NOTE) The Xpert Xpress SARS-CoV-2/FLU/RSV plus assay is intended as an aid in the diagnosis of influenza from Nasopharyngeal swab specimens and should not be used as a sole basis for treatment. Nasal washings and aspirates are unacceptable for Xpert Xpress SARS-CoV-2/FLU/RSV testing.  Fact Sheet for Patients: EntrepreneurPulse.com.au  Fact Sheet for Healthcare Providers: IncredibleEmployment.be  This test is not yet approved or cleared by the [place] FDA and has been  authorized for detection and/or diagnosis of SARS-CoV-2 by FDA under an Emergency Use Authorization (EUA). This EUA will remain in effect (meaning this test can be used) for the duration of the COVID-19 declaration under Section 564(b)(1) of the Act, 21 U.S.C. section 360bbb-3(b)(1), unless the authorization is terminated or revoked.  Performed at [facility], [address] [place], [place] [address]          Radiology Studies: DG Chest 2 View  Result Date: 07/25/2021 CLINICAL DATA:  Shortness of breath EXAM: CHEST - 2 VIEW COMPARISON:  02/15/2021, 11/23/2019 FINDINGS: Cardiomegaly with vascular congestion and mild interstitial pulmonary edema. Small bilateral pleural effusions. Aortic atherosclerosis. No pneumothorax. IMPRESSION: Cardiomegaly with small pleural effusions and mild interstitial edema. Electronically Signed   By: Donavan Foil M.D.   On: 07/25/2021 23:14   ECHOCARDIOGRAM COMPLETE  Result Date: 07/26/2021    ECHOCARDIOGRAM REPORT   Patient Name:   [name] Date of Exam: 07/26/2021 Medical Rec #:  [id number]        Height:       65.0 in Accession #:    [id number]       Weight:       112.0 lb Date of Birth:  [birth year]       BSA:  1.546 m Patient Age:    56 years         BP:           118/42 mmHg Patient Gender: F                HR:           70 bpm. Exam Location:  [facility] Procedure: 2D Echo, Cardiac Doppler, Color Doppler and Strain Analysis Indications:     CHF--acute diastolic C62.37  History:         Patient has prior history of Echocardiogram examinations, most                  recent 12/18/2020. CHF; Risk Factors:Diabetes.  Sonographer:     Sherrie Sport Referring Phys:  [id number] Athena Masse Diagnosing Phys: Kate Sable MD  Sonographer Comments: Global longitudinal strain was attempted. IMPRESSIONS  1. Left ventricular ejection fraction, by estimation, is 40 to 45%. The left ventricle has mildly decreased function. The left ventricle  demonstrates regional wall motion abnormalities (see scoring diagram/findings for description). There is mild left ventricular hypertrophy. Left ventricular diastolic parameters are consistent with Grade II diastolic dysfunction (pseudonormalization). There is moderate hypokinesis of the left ventricular, entire inferior segment and inferolateral wall. The average left ventricular global longitudinal strain is -10.7 %. The global longitudinal strain is abnormal.  2. Right ventricular systolic function is low normal. The right ventricular size is normal.  3. Left atrial size was severely dilated.  4. Right atrial size was mildly dilated.  5. A small pericardial effusion is present.  6. The mitral valve is normal in structure. Moderate to severe mitral valve regurgitation.  7. The aortic valve is calcified. Aortic valve regurgitation is mild. Mild to moderate aortic valve stenosis.  8. The inferior vena cava is dilated in size with <50% respiratory variability, suggesting right atrial pressure of 15 mmHg. FINDINGS  Left Ventricle: Left ventricular ejection fraction, by estimation, is 40 to 45%. The left ventricle has mildly decreased function. The left ventricle demonstrates regional wall motion abnormalities. Moderate hypokinesis of the left ventricular, entire inferior segment and inferolateral wall. The average left ventricular global longitudinal strain is -10.7 %. The global longitudinal strain is abnormal. The left ventricular internal cavity size was normal in size. There is mild left ventricular hypertrophy. Left ventricular diastolic parameters are consistent with Grade II diastolic dysfunction (pseudonormalization). Right Ventricle: The right ventricular size is normal. No increase in right ventricular wall thickness. Right ventricular systolic function is low normal. Left Atrium: Left atrial size was severely dilated. Right Atrium: Right atrial size was mildly dilated. Pericardium: A small pericardial  effusion is present. Mitral Valve: The mitral valve is normal in structure. Moderate to severe mitral valve regurgitation. MV peak gradient, 11.0 mmHg. The mean mitral valve gradient is 4.0 mmHg. Tricuspid Valve: The tricuspid valve is normal in structure. Tricuspid valve regurgitation is mild. Aortic Valve: The aortic valve is calcified. Aortic valve regurgitation is mild. Aortic regurgitation PHT measures 350 msec. Mild to moderate aortic stenosis is present. Aortic valve mean gradient measures 15.5 mmHg. Aortic valve peak gradient measures 25.6 mmHg. Aortic valve area, by VTI measures 0.47 cm. Pulmonic Valve: The pulmonic valve was not well visualized. Pulmonic valve regurgitation is mild. Aorta: The aortic root is normal in size and structure. Venous: The inferior vena cava is dilated in size with less than 50% respiratory variability, suggesting right atrial pressure of 15 mmHg. IAS/Shunts: No atrial level shunt detected by color  flow Doppler.  LEFT VENTRICLE PLAX 2D LVIDd:         5.80 cm      Diastology LVIDs:         4.70 cm      LV e' medial:    3.81 cm/s LV PW:         1.00 cm      LV E/e' medial:  33.6 LV IVS:        1.00 cm      LV e' lateral:   3.59 cm/s LVOT diam:     2.00 cm      LV E/e' lateral: 35.7 LV SV:         30 LV SV Index:   19           2D Longitudinal Strain LVOT Area:     3.14 cm     2D Strain GLS Avg:     -10.7 %  LV Volumes (MOD) LV vol d, MOD A2C: 122.0 ml 3D Volume EF: LV vol d, MOD A4C: 112.0 ml 3D EF:        43 % LV vol s, MOD A2C: 84.7 ml  LV EDV:       182 ml LV vol s, MOD A4C: 60.5 ml  LV ESV:       104 ml LV SV MOD A2C:     37.3 ml  LV SV:        78 ml LV SV MOD A4C:     112.0 ml LV SV MOD BP:      46.6 ml RIGHT VENTRICLE RV Basal diam:  3.40 cm RV S prime:     10.00 cm/s TAPSE (M-mode): 4.3 cm LEFT ATRIUM              Index        RIGHT ATRIUM           Index LA diam:        3.90 cm  2.52 cm/m   RA Area:     18.20 cm LA Vol (A2C):   62.1 ml  40.17 ml/m  RA Volume:   53.10  ml  34.34 ml/m LA Vol (A4C):   115.0 ml 74.38 ml/m LA Biplane Vol: 93.2 ml  60.28 ml/m  AORTIC VALVE                     PULMONIC VALVE AV Area (Vmax):    0.53 cm      PV Vmax:        0.57 m/s AV Area (Vmean):   0.49 cm      PV Vmean:       39.700 cm/s AV Area (VTI):     0.47 cm      PV VTI:         0.112 m AV Vmax:           253.00 cm/s   PV Peak grad:   1.3 mmHg AV Vmean:          186.500 cm/s  PV Mean grad:   1.0 mmHg AV VTI:            0.624 m       RVOT Peak grad: 2 mmHg AV Peak Grad:      25.6 mmHg AV Mean Grad:      15.5 mmHg LVOT Vmax:         42.40 cm/s LVOT Vmean:  28.800 cm/s LVOT VTI:          0.094 m LVOT/AV VTI ratio: 0.15 AI PHT:            350 msec  AORTA Ao Root diam: 2.60 cm MITRAL VALVE                TRICUSPID VALVE MV Area (PHT): 3.39 cm     TR Peak grad:   46.8 mmHg MV Area VTI:   0.79 cm     TR Vmax:        342.00 cm/s MV Peak grad:  11.0 mmHg MV Mean grad:  4.0 mmHg     SHUNTS MV Vmax:       1.66 m/s     Systemic VTI:  0.09 m MV Vmean:      91.9 cm/s    Systemic Diam: 2.00 cm MV Decel Time: 224 msec     Pulmonic VTI:  0.144 m MV E velocity: 128.00 cm/s MV A velocity: 63.80 cm/s MV E/A ratio:  2.01 Kate Sable MD Electronically signed by Kate Sable MD Signature Date/Time: 07/26/2021/5:21:44 PM    Final         Scheduled Meds:  aspirin EC  81 mg Oral Daily   carvedilol  3.125 mg Oral BID WC   feeding supplement ([employer] STANDARD 1.4)  325 mL Per Tube QID   free water  150 mL Per Tube QID   insulin aspart  0-5 Units Subcutaneous QHS   insulin aspart  0-9 Units Subcutaneous TID WC   lisinopril  2.5 mg Oral Daily   rOPINIRole  2 mg Per Tube BID   Continuous Infusions:  heparin 800 Units/hr (07/27/21 0819)    Assessment & Plan:   Active Problems:   Type 2 diabetes mellitus with hyperlipidemia (HCC)   Hypertension   Esophageal stricture   Gastrostomy tube dependent (HCC)   Right sided weakness   Chronic anemia   Acute on chronic combined  systolic and diastolic CHF (congestive heart failure) (HCC)   NSTEMI (non-ST elevated myocardial infarction) (HCC)   Stage 3a chronic kidney disease (HCC)   History of CVA (cerebrovascular accident)   Acute on chronic combined systolic and diastolic CHF (congestive heart failure)  -Patient presents with chest pain and shortness of breath, BNP 1541 with small effusions on chest x-ray - Echocardiogram 12/18/2020 with EF 35 to 40% and grade 2 diastolic dysfunction 87/68- still mildly volume overloaded echo EF 40 to 45%  Cardiology following  DC heparin start aspirin and Plavix  IV Lasix 20 mg twice daily daily I's and O's    Elevated troponin 2/2 demand ischemia Was on heparin drip we will discontinue Echo EF 40 to 45% Continue beta-blockers    Right sided weakness secondary to CVA Resume aspirin and Plavix since discontinuing heparin    Acute on chronic anemia - Hemoglobin 8.3, down from 9.5 a month prior 10/27 patient follows up with hematology.  Hemoglobin is stable     Hypertension - Continue lisinopril and Coreg     Type 2 diabetes mellitus with hyperlipidemia (HCC) - Sliding scale insulin coverage     Gastrostomy tube dependent secondary to esophageal stricture/stenosis - G-tube feeds        Stage 3a chronic kidney disease (HCC) - Renal function at baseline     DVT prophylaxis: scd Code Status: DNR Family Communication: Family at bedside Disposition Plan: Back home Status is: Inpatient  Remains inpatient appropriate because: Needs IV treatment for  severity of her illness as patient is in CHF.            LOS: 1 [name]   Time spent: 35 minutes with more than 50% on [place], MD [facility] Pager [phone]  If 7PM-7AM, please contact night-coverage 07/27/2021, 8:47 AM

## 2021-07-27 NOTE — ED Notes (Signed)
Pt said she can't breath. O2 stats are 99%. Pt is receiving 2 liters of oxygen.

## 2021-07-27 NOTE — ED Notes (Signed)
PTS BED CHANGED AND ALL SHEETS

## 2021-07-28 DIAGNOSIS — E785 Hyperlipidemia, unspecified: Secondary | ICD-10-CM

## 2021-07-28 DIAGNOSIS — E1169 Type 2 diabetes mellitus with other specified complication: Secondary | ICD-10-CM

## 2021-07-28 LAB — BASIC METABOLIC PANEL
Anion gap: 6 (ref 5–15)
BUN: 48 mg/dL — ABNORMAL HIGH (ref 8–23)
CO2: 31 mmol/L (ref 22–32)
Calcium: 8.5 mg/dL — ABNORMAL LOW (ref 8.9–10.3)
Chloride: 97 mmol/L — ABNORMAL LOW (ref 98–111)
Creatinine, Ser: 0.87 mg/dL (ref 0.44–1.00)
GFR, Estimated: >60 mL/min (ref >60)
Glucose, Bld: 147 mg/dL — ABNORMAL HIGH (ref 70–99)
Potassium: 4.7 mmol/L (ref 3.5–5.1)
Sodium: 134 mmol/L — ABNORMAL LOW (ref 135–145)

## 2021-07-28 LAB — CBC
HCT: 27.6 % — ABNORMAL LOW (ref 36.0–46.0)
Hemoglobin: 9 g/dL — ABNORMAL LOW (ref 12.0–15.0)
MCH: 30.7 pg (ref 26.0–34.0)
MCHC: 32.6 g/dL (ref 30.0–36.0)
MCV: 94.2 fL (ref 80.0–100.0)
Platelets: 306 10*3/uL (ref 150–400)
RBC: 2.93 MIL/uL — ABNORMAL LOW (ref 3.87–5.11)
RDW: 14.6 % (ref 11.5–15.5)
WBC: 8.6 10*3/uL (ref 4.0–10.5)
nRBC: 0 % (ref 0.0–0.2)

## 2021-07-28 LAB — CBG MONITORING, ED: Glucose-Capillary: 161 mg/dL — ABNORMAL HIGH (ref 70–99)

## 2021-07-28 MED ORDER — FUROSEMIDE 40 MG PO TABS: 20.0000 mg | ORAL_TABLET | Freq: Every day | ORAL | Status: DC

## 2021-07-28 NOTE — Progress Notes (Signed)
[facility] Cardiology  SUBJECTIVE: Patient laying in bed, denies chest pain or shortness of breath, reports "I want to go home"   Vitals:   07/28/21 0600 07/28/21 0630 07/28/21 0700 07/28/21 0730  BP: 101/84 (!) 122/45 (!) 139/58 (!) 133/51  Pulse: 77 72 80 79  Resp: 19 17 (!) 23 17  Temp:      TempSrc:      SpO2: 95% 94% 95% 95%  Weight:      Height:         Intake/Output Summary (Last 24 hours) at 07/28/2021 0748 Last data filed at 07/27/2021 1433 Gross per 24 hour  Intake 208.85 ml  Output --  Net 208.85 ml      PHYSICAL EXAM  General: Well developed, well nourished, in no acute distress HEENT:  Normocephalic and atramatic Neck:  No JVD.  Lungs: Clear bilaterally to auscultation and percussion. Heart: HRRR . Normal S1 and S2 without gallops or murmurs.  Abdomen: Bowel sounds are positive, abdomen soft and non-tender  Msk:  Back normal, normal gait. Normal strength and tone for age. Extremities: No clubbing, cyanosis or edema.   Neuro: Alert and oriented X 3. Psych:  Good affect, responds appropriately   LABS: Basic Metabolic Panel: Recent Labs    07/27/21 0635 07/28/21 0620  NA 134* 134*  K 4.8 4.7  CL 97* 97*  CO2 29 31  GLUCOSE 135* 147*  BUN 50* 48*  CREATININE 1.00 0.87  CALCIUM 8.6* 8.5*   Liver Function Tests: No results for input(s): AST, ALT, ALKPHOS, BILITOT, PROT, ALBUMIN in the last 72 hours. No results for input(s): LIPASE, AMYLASE in the last 72 hours. CBC: Recent Labs    07/27/21 0635 07/28/21 0620  WBC 10.7* 8.6  HGB 9.1* 9.0*  HCT 28.2* 27.6*  MCV 93.7 94.2  PLT 314 306   Cardiac Enzymes: No results for input(s): CKTOTAL, CKMB, CKMBINDEX, TROPONINI in the last 72 hours. BNP: Invalid input(s): POCBNP D-Dimer: No results for input(s): DDIMER in the last 72 hours. Hemoglobin A1C: Recent Labs    07/26/21 0623  HGBA1C 5.3   Fasting Lipid Panel: No results for input(s): CHOL, HDL, LDLCALC, TRIG, CHOLHDL, LDLDIRECT in the last 72  hours. Thyroid Function Tests: No results for input(s): TSH, T4TOTAL, T3FREE, THYROIDAB in the last 72 hours.  Invalid input(s): FREET3 Anemia Panel: No results for input(s): VITAMINB12, FOLATE, FERRITIN, TIBC, IRON, RETICCTPCT in the last 72 hours.  ECHOCARDIOGRAM COMPLETE  Result Date: 07/26/2021    ECHOCARDIOGRAM REPORT   Patient Name:   [name] Date of Exam: 07/26/2021 Medical Rec #:  [id number]        Height:       65.0 in Accession #:    [id number]       Weight:       112.0 lb Date of Birth:  1929/03/21       BSA:          1.546 m Patient Age:    87 years         BP:           118/42 mmHg Patient Gender: F                HR:           70 bpm. Exam Location:  [facility] Procedure: 2D Echo, Cardiac Doppler, Color Doppler and Strain Analysis Indications:     CHF--acute diastolic Z16.96  History:         Patient  has prior history of Echocardiogram examinations, most                  recent 12/18/2020. CHF; Risk Factors:Diabetes.  Sonographer:     Sherrie Sport Referring Phys:  [id number] Athena Masse Diagnosing Phys: Kate Sable MD  Sonographer Comments: Global longitudinal strain was attempted. IMPRESSIONS  1. Left ventricular ejection fraction, by estimation, is 40 to 45%. The left ventricle has mildly decreased function. The left ventricle demonstrates regional wall motion abnormalities (see scoring diagram/findings for description). There is mild left ventricular hypertrophy. Left ventricular diastolic parameters are consistent with Grade II diastolic dysfunction (pseudonormalization). There is moderate hypokinesis of the left ventricular, entire inferior segment and inferolateral wall. The average left ventricular global longitudinal strain is -10.7 %. The global longitudinal strain is abnormal.  2. Right ventricular systolic function is low normal. The right ventricular size is normal.  3. Left atrial size was severely dilated.  4. Right atrial size was mildly dilated.  5. A small pericardial  effusion is present.  6. The mitral valve is normal in structure. Moderate to severe mitral valve regurgitation.  7. The aortic valve is calcified. Aortic valve regurgitation is mild. Mild to moderate aortic valve stenosis.  8. The inferior vena cava is dilated in size with <50% respiratory variability, suggesting right atrial pressure of 15 mmHg. FINDINGS  Left Ventricle: Left ventricular ejection fraction, by estimation, is 40 to 45%. The left ventricle has mildly decreased function. The left ventricle demonstrates regional wall motion abnormalities. Moderate hypokinesis of the left ventricular, entire inferior segment and inferolateral wall. The average left ventricular global longitudinal strain is -10.7 %. The global longitudinal strain is abnormal. The left ventricular internal cavity size was normal in size. There is mild left ventricular hypertrophy. Left ventricular diastolic parameters are consistent with Grade II diastolic dysfunction (pseudonormalization). Right Ventricle: The right ventricular size is normal. No increase in right ventricular wall thickness. Right ventricular systolic function is low normal. Left Atrium: Left atrial size was severely dilated. Right Atrium: Right atrial size was mildly dilated. Pericardium: A small pericardial effusion is present. Mitral Valve: The mitral valve is normal in structure. Moderate to severe mitral valve regurgitation. MV peak gradient, 11.0 mmHg. The mean mitral valve gradient is 4.0 mmHg. Tricuspid Valve: The tricuspid valve is normal in structure. Tricuspid valve regurgitation is mild. Aortic Valve: The aortic valve is calcified. Aortic valve regurgitation is mild. Aortic regurgitation PHT measures 350 msec. Mild to moderate aortic stenosis is present. Aortic valve mean gradient measures 15.5 mmHg. Aortic valve peak gradient measures 25.6 mmHg. Aortic valve area, by VTI measures 0.47 cm. Pulmonic Valve: The pulmonic valve was not well visualized. Pulmonic  valve regurgitation is mild. Aorta: The aortic root is normal in size and structure. Venous: The inferior vena cava is dilated in size with less than 50% respiratory variability, suggesting right atrial pressure of 15 mmHg. IAS/Shunts: No atrial level shunt detected by color flow Doppler.  LEFT VENTRICLE PLAX 2D LVIDd:         5.80 cm      Diastology LVIDs:         4.70 cm      LV e' medial:    3.81 cm/s LV PW:         1.00 cm      LV E/e' medial:  33.6 LV IVS:        1.00 cm      LV e' lateral:   3.59 cm/s LVOT  diam:     2.00 cm      LV E/e' lateral: 35.7 LV SV:         30 LV SV Index:   19           2D Longitudinal Strain LVOT Area:     3.14 cm     2D Strain GLS Avg:     -10.7 %  LV Volumes (MOD) LV vol d, MOD A2C: 122.0 ml 3D Volume EF: LV vol d, MOD A4C: 112.0 ml 3D EF:        43 % LV vol s, MOD A2C: 84.7 ml  LV EDV:       182 ml LV vol s, MOD A4C: 60.5 ml  LV ESV:       104 ml LV SV MOD A2C:     37.3 ml  LV SV:        78 ml LV SV MOD A4C:     112.0 ml LV SV MOD BP:      46.6 ml RIGHT VENTRICLE RV Basal diam:  3.40 cm RV S prime:     10.00 cm/s TAPSE (M-mode): 4.3 cm LEFT ATRIUM              Index        RIGHT ATRIUM           Index LA diam:        3.90 cm  2.52 cm/m   RA Area:     18.20 cm LA Vol (A2C):   62.1 ml  40.17 ml/m  RA Volume:   53.10 ml  34.34 ml/m LA Vol (A4C):   115.0 ml 74.38 ml/m LA Biplane Vol: 93.2 ml  60.28 ml/m  AORTIC VALVE                     PULMONIC VALVE AV Area (Vmax):    0.53 cm      PV Vmax:        0.57 m/s AV Area (Vmean):   0.49 cm      PV Vmean:       39.700 cm/s AV Area (VTI):     0.47 cm      PV VTI:         0.112 m AV Vmax:           253.00 cm/s   PV Peak grad:   1.3 mmHg AV Vmean:          186.500 cm/s  PV Mean grad:   1.0 mmHg AV VTI:            0.624 m       RVOT Peak grad: 2 mmHg AV Peak Grad:      25.6 mmHg AV Mean Grad:      15.5 mmHg LVOT Vmax:         42.40 cm/s LVOT Vmean:        28.800 cm/s LVOT VTI:          0.094 m LVOT/AV VTI ratio: 0.15 AI PHT:             350 msec  AORTA Ao Root diam: 2.60 cm MITRAL VALVE                TRICUSPID VALVE MV Area (PHT): 3.39 cm     TR Peak grad:   46.8 mmHg MV Area VTI:   0.79 cm     TR Vmax:  342.00 cm/s MV Peak grad:  11.0 mmHg MV Mean grad:  4.0 mmHg     SHUNTS MV Vmax:       1.66 m/s     Systemic VTI:  0.09 m MV Vmean:      91.9 cm/s    Systemic Diam: 2.00 cm MV Decel Time: 224 msec     Pulmonic VTI:  0.144 m MV E velocity: 128.00 cm/s MV A velocity: 63.80 cm/s MV E/A ratio:  2.01 Kate Sable MD Electronically signed by Kate Sable MD Signature Date/Time: 07/26/2021/5:21:44 PM    Final      Echo LVEF 40 to 45%, moderate to severe mitral regurgitation  TELEMETRY: Sinus rhythm 71 bpm:  ASSESSMENT AND PLAN:  Active Problems:   Type 2 diabetes mellitus with hyperlipidemia (HCC)   Hypertension   Esophageal stricture   Gastrostomy tube dependent (HCC)   Right sided weakness   Chronic anemia   Acute on chronic combined systolic and diastolic CHF (congestive heart failure) (HCC)   NSTEMI (non-ST elevated myocardial infarction) ([place])   Stage 3a chronic kidney disease (HCC)   History of CVA (cerebrovascular accident)    1. Elevated troponin (186, 225), in the absence of chest pain, with nondiagnostic ECG, in the setting of acute on chronic combined diastolic and systolic congestive heart failure, likely demand supply ischemia 2.  Acute on chronic combined systolic and diastolic congestive heart failure, BNP 1541, chest x-ray revealing mild interstitial edema, clinically improved after initial diuresis. Patient reports feeling much better, denies chest pain or shortness of breath, oxygen saturation 95% on room air.  3.  Anemia, hemoglobin 9.0, back on aspirin and clopidogrel 4.  CVA, residual right-sided weakness, on aspirin and Plavix     Recommendations  Continue current medication Continue aspirin and Plavix Convert to oral furosemide May DC home Follow-up Dr. Ubaldo Glassing in 1 to 2  weeks  Sign off for now, please call if any questions   Isaias Cowman, MD, PhD, [facility] 07/28/2021 7:48 AM

## 2021-07-28 NOTE — Discharge Summary (Signed)
[name] Day QTM:[id number] DOB: Aug 25, 1929 DOA: 07/25/2021  PCP: [name], MD  Admit date: 07/25/2021 Discharge date: 07/28/2021  Admitted From: home Disposition:  home  Recommendations for Outpatient Follow-up:  Follow up with PCP in 1 week Please obtain BMP/CBC in one week Please follow up cardiology in 1 week Follow-up with heart failure clinic in 1 week    Discharge Condition:Stable CODE STATUS: Full Diet recommendation: Heart Healthy  Brief/Interim Summary: Per HPI:87 year old female referred for evaluation of acute on chronic combined systolic and diastolic congestive heart failure.  The patient was in her usual state of health till 1 day prior to admission she developed shortness of breath and pain all over. X-ray revealed mild interstitial edema with small pleural effusions.  BNP was elevated.  Cardiology was consulted.  Patient was started on IV Lasix.   Acute on chronic combined systolic and diastolic CHF (congestive heart failure)  -Patient presents with chest pain and shortness of breath, BNP 1541 with small effusions on chest x-ray Echo done on 10/26 revealed EF of 40 to 45% Cardiology was consulted Patient was started on IV Lasix with transition to p.o. 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 - G-tube feeds        Stage 3a chronic kidney disease ([place]) - Renal function at baseline          Discharge Diagnoses:  Active Problems:   Type 2 diabetes mellitus with hyperlipidemia (HCC)   Hypertension   Esophageal stricture   Gastrostomy tube dependent (HCC)   Right sided weakness   Chronic anemia   Acute on chronic combined systolic and diastolic CHF (congestive heart failure) (HCC)   History of CVA (cerebrovascular accident)    Discharge Instructions  Discharge Instructions     Call MD for:  difficulty breathing, headache or visual disturbances   Complete by: As directed    Diet - low sodium heart healthy   Complete by: As directed    Diet Carb Modified   Complete by: As directed    Increase activity slowly   Complete by: As directed       Allergies as of 07/28/2021       Reactions   Elemental Sulfur Diarrhea, Nausea And Vomiting   Gabapentin Swelling   Lipitor [atorvastatin] Other (See Comments)   Muscle aches   Mevacor [lovastatin] Other (See Comments)   Muscle aches   Milk-related Compounds Other (See Comments)   Large quantities cause headaches    Septra [sulfamethoxazole-trimethoprim] Other (See Comments)   Unknown   Statins Other (See Comments)   Muscle pain   Zocor [simvastatin] Other (See Comments)   Muscle aches        Medication List     TAKE these medications    acetaminophen 160 MG/5ML solution Commonly known as: TYLENOL Place 20.3 mLs (650 mg total) into feeding tube every 6 (six) hours as needed for mild pain.  allopurinol 100 MG tablet Commonly known as: ZYLOPRIM Take by mouth.   ascorbic acid 250 MG tablet Commonly known as: VITAMIN C Take 1 tablet by G tube 2 (two) times daily   aspirin 81 MG chewable tablet Place 1 tablet (81 mg total) into feeding tube daily.   Bromfenac Sodium 0.09 % Soln Place 1 drop into the left eye 2 (two) times daily.   carvedilol 3.125 MG tablet Commonly known as: COREG Place 1 tablet (3.125 mg total) into feeding tube 2 (two) times daily with a meal.   clopidogrel 75 MG  tablet Commonly known as: PLAVIX GIVE 1 TABLET VIA FEEDING TUBE EVERY DAY AS DIRECTED   feeding supplement (KATE FARMS STANDARD 1.4) Liqd liquid Place 325 mLs into feeding tube 4 (four) times daily. What changed: when to take this   free water Soln Place 100 mLs into feeding tube every 6 (six) hours.   furosemide 20 MG tablet Commonly known as: LASIX Place 0.5 tablets (10 mg total) into feeding tube daily. What changed:  how much to take when to take this   glipiZIDE 2.5 MG 24 hr tablet Commonly known as: GLUCOTROL XL Take 2.5-5 mg by mouth in the morning and at bedtime.   hydrocerin Crea Apply 1 application topically 2 (two) times daily. To bilateral feet   Hypromellose 0.2 % Soln Place 1 drop into both eyes 3 (three) times daily as needed (dry eyes).   lisinopril 2.5 MG tablet Commonly known as: ZESTRIL Take 1 tablet by mouth daily. What changed: Another medication with the same name was removed. Continue taking this medication, and follow the directions you see here.   loperamide 1 MG/5ML solution Commonly known as: IMODIUM Take 1 mg by mouth as needed for diarrhea or loose stools.   metFORMIN 500 MG tablet Commonly known as: GLUCOPHAGE Place 1 tablet (500 mg total) into feeding tube 2 (two) times daily with a meal.   multivitamin Liqd Place 15 mLs into feeding tube daily.   pantoprazole sodium 40 mg Commonly known as: PROTONIX Place 20 mLs (40 mg total) into feeding tube daily.   polyethylene glycol 17 g packet Commonly known as: MIRALAX / GLYCOLAX Place 17 g into feeding tube daily as needed.   pregabalin 25 MG capsule Commonly known as: LYRICA Take by mouth.   rOPINIRole 2 MG tablet Commonly known as: REQUIP Place 1 tablet (2 mg total) into feeding tube 2 (two) times daily.        Follow-up Information     Teodoro Spray, MD Follow up in 1 week(s).   Specialty: Cardiology Contact information: [place]  [address] [phone]         [name], MD Follow up in 1 week(s).   Specialty: Internal Medicine Contact information: [place] [address] [phone]         [place] Follow up in 1 week(s).   Specialty: Cardiology Contact information: [place] [address] [address] [phone]               Allergies  Allergen Reactions   Elemental Sulfur Diarrhea and Nausea And Vomiting   Gabapentin Swelling   Lipitor [Atorvastatin] Other (See Comments)    Muscle aches   Mevacor [Lovastatin] Other (See Comments)    Muscle aches   Milk-Related Compounds Other (See Comments)    Large quantities cause headaches  Septra [Sulfamethoxazole-Trimethoprim] Other (See Comments)    Unknown   Statins Other (See Comments)    Muscle pain   Zocor [Simvastatin] Other (See Comments)    Muscle aches    Consultations: Cardiology   Procedures/Studies: DG Chest 2 View  Result Date: 07/25/2021 CLINICAL DATA:  Shortness of breath EXAM: CHEST - 2 VIEW COMPARISON:  02/15/2021, 11/23/2019 FINDINGS: Cardiomegaly with vascular congestion and mild interstitial pulmonary edema. Small bilateral pleural effusions. Aortic atherosclerosis. No pneumothorax. IMPRESSION: Cardiomegaly with small pleural effusions and mild interstitial edema. Electronically Signed   By: Donavan Foil M.D.   On: 07/25/2021 23:14   US Venous Img Lower Unilateral Left (DVT)  Result Date: 07/24/2021 CLINICAL DATA:  LEFT lower extremity swelling and pain EXAM: LEFT LOWER EXTREMITY VENOUS DOPPLER ULTRASOUND TECHNIQUE: Gray-scale sonography with compression, as well as color and duplex ultrasound, were performed to evaluate the deep venous system(s) from the level of the common femoral vein through the popliteal and proximal calf veins. COMPARISON:  Chest radiograph, 02/15/2021. FINDINGS: VENOUS  Normal compressibility of the common femoral, superficial femoral, and popliteal veins. Visualized portions of profunda femoral vein and great saphenous vein unremarkable. No filling defects to suggest DVT on grayscale or color Doppler imaging. Doppler waveforms show normal direction of venous flow, normal respiratory plasticity and response to augmentation. Limited views of the contralateral common femoral vein are unremarkable. OTHER No evidence of superficial thrombophlebitis or abnormal fluid collection. Limitations: Patient body habitus, specifically lower extremity edema IMPRESSION: 1. No evidence of femoropopliteal DVT within the LEFT lower extremity. 2. Suboptimal evaluation of the extremity calf veins secondary to lower extremity edema. Michaelle Birks, MD [facility] Radiology Electronically Signed   By: Michaelle Birks M.D.   On: 07/24/2021 10:49   ECHOCARDIOGRAM COMPLETE  Result Date: 07/26/2021    ECHOCARDIOGRAM REPORT   Patient Name:   DEVINA BEZOLD BOOTH DAY Date of Exam: 07/26/2021 Medical Rec #:  [id number]        Height:       65.0 in Accession #:    [id number]       Weight:       112.0 lb Date of Birth:  10/08/1928       BSA:          1.546 m Patient Age:    13 years         BP:           118/42 mmHg Patient Gender: F                HR:           70 bpm. Exam Location:  [facility] Procedure: 2D Echo, Cardiac Doppler, Color Doppler and Strain Analysis Indications:     CHF--acute diastolic W97.98  History:         Patient has prior history of Echocardiogram examinations, most                  recent 12/18/2020. CHF; Risk Factors:Diabetes.  Sonographer:     Sherrie Sport Referring Phys:  [id number] Athena Masse Diagnosing Phys: Kate Sable MD  Sonographer Comments: Global longitudinal strain was attempted. IMPRESSIONS  1. Left ventricular ejection fraction, by estimation, is 40 to 45%. The left ventricle has mildly decreased function. The left ventricle demonstrates  regional wall motion abnormalities (see scoring diagram/findings for description). There is mild left ventricular hypertrophy. Left ventricular diastolic parameters are consistent with Grade II diastolic dysfunction (pseudonormalization). There is moderate hypokinesis  of the left ventricular, entire inferior segment and inferolateral wall. The average left ventricular global longitudinal strain is -10.7 %. The global longitudinal strain is abnormal.  2. Right ventricular systolic function is low normal. The right ventricular size is normal.  3. Left atrial size was severely dilated.  4. Right atrial size was mildly dilated.  5. A small pericardial effusion is present.  6. The mitral valve is normal in structure. Moderate to severe mitral valve regurgitation.  7. The aortic valve is calcified. Aortic valve regurgitation is mild. Mild to moderate aortic valve stenosis.  8. The inferior vena cava is dilated in size with <50% respiratory variability, suggesting right atrial pressure of 15 mmHg. FINDINGS  Left Ventricle: Left ventricular ejection fraction, by estimation, is 40 to 45%. The left ventricle has mildly decreased function. The left ventricle demonstrates regional wall motion abnormalities. Moderate hypokinesis of the left ventricular, entire inferior segment and inferolateral wall. The average left ventricular global longitudinal strain is -10.7 %. The global longitudinal strain is abnormal. The left ventricular internal cavity size was normal in size. There is mild left ventricular hypertrophy. Left ventricular diastolic parameters are consistent with Grade II diastolic dysfunction (pseudonormalization). Right Ventricle: The right ventricular size is normal. No increase in right ventricular wall thickness. Right ventricular systolic function is low normal. Left Atrium: Left atrial size was severely dilated. Right Atrium: Right atrial size was mildly dilated. Pericardium: A small pericardial effusion is  present. Mitral Valve: The mitral valve is normal in structure. Moderate to severe mitral valve regurgitation. MV peak gradient, 11.0 mmHg. The mean mitral valve gradient is 4.0 mmHg. Tricuspid Valve: The tricuspid valve is normal in structure. Tricuspid valve regurgitation is mild. Aortic Valve: The aortic valve is calcified. Aortic valve regurgitation is mild. Aortic regurgitation PHT measures 350 msec. Mild to moderate aortic stenosis is present. Aortic valve mean gradient measures 15.5 mmHg. Aortic valve peak gradient measures 25.6 mmHg. Aortic valve area, by VTI measures 0.47 cm. Pulmonic Valve: The pulmonic valve was not well visualized. Pulmonic valve regurgitation is mild. Aorta: The aortic root is normal in size and structure. Venous: The inferior vena cava is dilated in size with less than 50% respiratory variability, suggesting right atrial pressure of 15 mmHg. IAS/Shunts: No atrial level shunt detected by color flow Doppler.  LEFT VENTRICLE PLAX 2D LVIDd:         5.80 cm      Diastology LVIDs:         4.70 cm      LV e' medial:    3.81 cm/s LV PW:         1.00 cm      LV E/e' medial:  33.6 LV IVS:        1.00 cm      LV e' lateral:   3.59 cm/s LVOT diam:     2.00 cm      LV E/e' lateral: 35.7 LV SV:         30 LV SV Index:   19           2D Longitudinal Strain LVOT Area:     3.14 cm     2D Strain GLS Avg:     -10.7 %  LV Volumes (MOD) LV vol d, MOD A2C: 122.0 ml 3D Volume EF: LV vol d, MOD A4C: 112.0 ml 3D EF:        43 % LV vol s, MOD A2C: 84.7 ml  LV EDV:  182 ml LV vol s, MOD A4C: 60.5 ml  LV ESV:       104 ml LV SV MOD A2C:     37.3 ml  LV SV:        78 ml LV SV MOD A4C:     112.0 ml LV SV MOD BP:      46.6 ml RIGHT VENTRICLE RV Basal diam:  3.40 cm RV S prime:     10.00 cm/s TAPSE (M-mode): 4.3 cm LEFT ATRIUM              Index        RIGHT ATRIUM           Index LA diam:        3.90 cm  2.52 cm/m   RA Area:     18.20 cm LA Vol (A2C):   62.1 ml  40.17 ml/m  RA Volume:   53.10 ml  34.34  ml/m LA Vol (A4C):   115.0 ml 74.38 ml/m LA Biplane Vol: 93.2 ml  60.28 ml/m  AORTIC VALVE                     PULMONIC VALVE AV Area (Vmax):    0.53 cm      PV Vmax:        0.57 m/s AV Area (Vmean):   0.49 cm      PV Vmean:       39.700 cm/s AV Area (VTI):     0.47 cm      PV VTI:         0.112 m AV Vmax:           253.00 cm/s   PV Peak grad:   1.3 mmHg AV Vmean:          186.500 cm/s  PV Mean grad:   1.0 mmHg AV VTI:            0.624 m       RVOT Peak grad: 2 mmHg AV Peak Grad:      25.6 mmHg AV Mean Grad:      15.5 mmHg LVOT Vmax:         42.40 cm/s LVOT Vmean:        28.800 cm/s LVOT VTI:          0.094 m LVOT/AV VTI ratio: 0.15 AI PHT:            350 msec  AORTA Ao Root diam: 2.60 cm MITRAL VALVE                TRICUSPID VALVE MV Area (PHT): 3.39 cm     TR Peak grad:   46.8 mmHg MV Area VTI:   0.79 cm     TR Vmax:        342.00 cm/s MV Peak grad:  11.0 mmHg MV Mean grad:  4.0 mmHg     SHUNTS MV Vmax:       1.66 m/s     Systemic VTI:  0.09 m MV Vmean:      91.9 cm/s    Systemic Diam: 2.00 cm MV Decel Time: 224 msec     Pulmonic VTI:  0.144 m MV E velocity: 128.00 cm/s MV A velocity: 63.80 cm/s MV E/A ratio:  2.01 Kate Sable MD Electronically signed by Kate Sable MD Signature Date/Time: 07/26/2021/5:21:44 PM    Final       Subjective: Feels better.  Denies shortness of breath  or dyspnea on exertion or chest pain  Discharge Exam: Vitals:   07/28/21 0927 07/28/21 0930  BP:  (!) 133/53  Pulse:  72  Resp:  19  Temp:    SpO2: 98% 98%   Vitals:   07/28/21 0830 07/28/21 0900 07/28/21 0927 07/28/21 0930  BP: (!) 148/68 (!) 119/48  (!) 133/53  Pulse: 87 69  72  Resp: (!) 24 20  19   Temp:      TempSrc:      SpO2: 95% 97% 98% 98%  Weight:      Height:        General: Pt is alert, awake, not in acute distress Cardiovascular: RRR, S1/S2 +, no rubs, no gallops Respiratory: CTA bilaterally, no wheezing, no rhonchi Abdominal: Soft, NT, ND, bowel sounds + Extremities: no  edema, no cyanosis    The results of significant diagnostics from this hospitalization (including imaging, microbiology, ancillary and laboratory) are listed below for reference.     Microbiology: Recent Results (from the past 240 hour(s))  Resp Panel by RT-PCR (Flu A&B, Covid) Nasopharyngeal Swab     Status: None   Collection Time: 07/26/21 12:55 AM   Specimen: Nasopharyngeal Swab; Nasopharyngeal(NP) swabs in vial transport medium  Result Value Ref Range Status   SARS Coronavirus 2 by RT PCR NEGATIVE NEGATIVE Final    Comment: (NOTE) SARS-CoV-2 target nucleic acids are NOT DETECTED.  The SARS-CoV-2 RNA is generally detectable in upper respiratory specimens during the acute phase of infection. The lowest concentration of SARS-CoV-2 viral copies this assay can detect is 138 copies/mL. A negative result does not preclude SARS-Cov-2 infection and should not be used as the sole basis for treatment or other patient management decisions. A negative result may occur with  improper specimen collection/handling, submission of specimen other than nasopharyngeal swab, presence of viral mutation(s) within the areas targeted by this assay, and inadequate number of viral copies(<138 copies/mL). A negative result must be combined with clinical observations, patient history, and epidemiological information. The expected result is Negative.  Fact Sheet for Patients:  EntrepreneurPulse.com.au  Fact Sheet for Healthcare Providers:  IncredibleEmployment.be  This test is no t yet approved or cleared by the [place] FDA and  has been authorized for detection and/or diagnosis of SARS-CoV-2 by FDA under an Emergency Use Authorization (EUA). This EUA will remain  in effect (meaning this test can be used) for the duration of the COVID-19 declaration under Section 564(b)(1) of the Act, 21 U.S.C.section 360bbb-3(b)(1), unless the authorization is terminated  or  revoked sooner.       Influenza A by PCR NEGATIVE NEGATIVE Final   Influenza B by PCR NEGATIVE NEGATIVE Final    Comment: (NOTE) The Xpert Xpress SARS-CoV-2/FLU/RSV plus assay is intended as an aid in the diagnosis of influenza from Nasopharyngeal swab specimens and should not be used as a sole basis for treatment. Nasal washings and aspirates are unacceptable for Xpert Xpress SARS-CoV-2/FLU/RSV testing.  Fact Sheet for Patients: EntrepreneurPulse.com.au  Fact Sheet for Healthcare Providers: IncredibleEmployment.be  This test is not yet approved or cleared by the [place] FDA and has been authorized for detection and/or diagnosis of SARS-CoV-2 by FDA under an Emergency Use Authorization (EUA). This EUA will remain in effect (meaning this test can be used) for the duration of the COVID-19 declaration under Section 564(b)(1) of the Act, 21 U.S.C. section 360bbb-3(b)(1), unless the authorization is terminated or revoked.  Performed at [facility], [address], [place], [place] [address]  Labs: BNP (last 3 results) Recent Labs    02/15/21 1222 07/25/21 2258 07/27/21 1441  BNP 1,515.3* 1,541.0* 3,545.6*   Basic Metabolic Panel: Recent Labs  Lab 07/25/21 2258 07/27/21 0635 07/28/21 0620  NA 132* 134* 134*  K 4.9 4.8 4.7  CL 97* 97* 97*  CO2 28 29 31   GLUCOSE 224* 135* 147*  BUN 56* 50* 48*  CREATININE 1.24* 1.00 0.87  CALCIUM 8.8* 8.6* 8.5*   Liver Function Tests: No results for input(s): AST, ALT, ALKPHOS, BILITOT, PROT, ALBUMIN in the last 168 hours. No results for input(s): LIPASE, AMYLASE in the last 168 hours. No results for input(s): AMMONIA in the last 168 hours. CBC: Recent Labs  Lab 07/25/21 2258 07/27/21 0635 07/28/21 0620  WBC 6.7 10.7* 8.6  HGB 8.3* 9.1* 9.0*  HCT 25.1* 28.2* 27.6*  MCV 95.1 93.7 94.2  PLT 305 314 306   Cardiac Enzymes: No results for input(s): CKTOTAL, CKMB,  CKMBINDEX, TROPONINI in the last 168 hours. BNP: Invalid input(s): POCBNP CBG: Recent Labs  Lab 07/27/21 0723 07/27/21 1121 07/27/21 1605 07/27/21 2107 07/28/21 0743  GLUCAP 154* 257* 70 206* 161*   D-Dimer No results for input(s): DDIMER in the last 72 hours. Hgb A1c Recent Labs    07/26/21 0623  HGBA1C 5.3   Lipid Profile No results for input(s): CHOL, HDL, LDLCALC, TRIG, CHOLHDL, LDLDIRECT in the last 72 hours. Thyroid function studies No results for input(s): TSH, T4TOTAL, T3FREE, THYROIDAB in the last 72 hours.  Invalid input(s): FREET3 Anemia work up No results for input(s): VITAMINB12, FOLATE, FERRITIN, TIBC, IRON, RETICCTPCT in the last 72 hours. Urinalysis    Component Value Date/Time   COLORURINE YELLOW (A) 12/21/2020 1550   APPEARANCEUR HAZY (A) 12/21/2020 1550   LABSPEC 1.015 12/21/2020 1550   PHURINE 7.0 12/21/2020 1550   GLUCOSEU NEGATIVE 12/21/2020 1550   HGBUR SMALL (A) 12/21/2020 1550   BILIRUBINUR NEGATIVE 12/21/2020 1550   KETONESUR NEGATIVE 12/21/2020 1550   PROTEINUR 30 (A) 12/21/2020 1550   NITRITE POSITIVE (A) 12/21/2020 1550   LEUKOCYTESUR LARGE (A) 12/21/2020 1550   Sepsis Labs Invalid input(s): PROCALCITONIN,  WBC,  LACTICIDVEN Microbiology Recent Results (from the past 240 hour(s))  Resp Panel by RT-PCR (Flu A&B, Covid) Nasopharyngeal Swab     Status: None   Collection Time: 07/26/21 12:55 AM   Specimen: Nasopharyngeal Swab; Nasopharyngeal(NP) swabs in vial transport medium  Result Value Ref Range Status   SARS Coronavirus 2 by RT PCR NEGATIVE NEGATIVE Final    Comment: (NOTE) SARS-CoV-2 target nucleic acids are NOT DETECTED.  The SARS-CoV-2 RNA is generally detectable in upper respiratory specimens during the acute phase of infection. The lowest concentration of SARS-CoV-2 viral copies this assay can detect is 138 copies/mL. A negative result does not preclude SARS-Cov-2 infection and should not be used as the sole basis for  treatment or other patient management decisions. A negative result may occur with  improper specimen collection/handling, submission of specimen other than nasopharyngeal swab, presence of viral mutation(s) within the areas targeted by this assay, and inadequate number of viral copies(<138 copies/mL). A negative result must be combined with clinical observations, patient history, and epidemiological information. The expected result is Negative.  Fact Sheet for Patients:  EntrepreneurPulse.com.au  Fact Sheet for Healthcare Providers:  IncredibleEmployment.be  This test is no t yet approved or cleared by the [place] FDA and  has been authorized for detection and/or diagnosis of SARS-CoV-2 by FDA under an Emergency Use Authorization (EUA). This  EUA will remain  in effect (meaning this test can be used) for the duration of the COVID-19 declaration under Section 564(b)(1) of the Act, 21 U.S.C.section 360bbb-3(b)(1), unless the authorization is terminated  or revoked sooner.       Influenza A by PCR NEGATIVE NEGATIVE Final   Influenza B by PCR NEGATIVE NEGATIVE Final    Comment: (NOTE) The Xpert Xpress SARS-CoV-2/FLU/RSV plus assay is intended as an aid in the diagnosis of influenza from Nasopharyngeal swab specimens and should not be used as a sole basis for treatment. Nasal washings and aspirates are unacceptable for Xpert Xpress SARS-CoV-2/FLU/RSV testing.  Fact Sheet for Patients: EntrepreneurPulse.com.au  Fact Sheet for Healthcare Providers: IncredibleEmployment.be  This test is not yet approved or cleared by the [place] FDA and has been authorized for detection and/or diagnosis of SARS-CoV-2 by FDA under an Emergency Use Authorization (EUA). This EUA will remain in effect (meaning this test can be used) for the duration of the COVID-19 declaration under Section 564(b)(1) of the Act, 21  U.S.C. section 360bbb-3(b)(1), unless the authorization is terminated or revoked.  Performed at [facility], [address] [place], [place] [address]      Time coordinating discharge: Over 30 minutes  SIGNED:   Nolberto Hanlon, MD  [facility] 07/28/2021, 10:49 AM Pager   If 7PM-7AM, please contact night-coverage [email or web address] Password [email or web address]

## 2021-08-07 ENCOUNTER — Other Ambulatory Visit: Payer: Self-pay

## 2021-08-07 ENCOUNTER — Encounter: Payer: Self-pay | Admitting: Emergency Medicine

## 2021-08-07 ENCOUNTER — Inpatient Hospital Stay
Admission: EM | Admit: 2021-08-07 | Discharge: 2021-08-09 | DRG: 291 | Disposition: A | Discharge status: Home / Self Care | Payer: Medicare Other | Attending: Internal Medicine | Admitting: Internal Medicine

## 2021-08-07 ENCOUNTER — Emergency Department: Payer: Medicare Other

## 2021-08-07 DIAGNOSIS — D638 Anemia in other chronic diseases classified elsewhere: Secondary | ICD-10-CM | POA: Diagnosis not present

## 2021-08-07 DIAGNOSIS — E785 Hyperlipidemia, unspecified: Secondary | ICD-10-CM | POA: Diagnosis present

## 2021-08-07 DIAGNOSIS — Z823 Family history of stroke: Secondary | ICD-10-CM

## 2021-08-07 DIAGNOSIS — Z79899 Other long term (current) drug therapy: Secondary | ICD-10-CM

## 2021-08-07 DIAGNOSIS — Z7984 Long term (current) use of oral hypoglycemic drugs: Secondary | ICD-10-CM

## 2021-08-07 DIAGNOSIS — I272 Pulmonary hypertension, unspecified: Secondary | ICD-10-CM | POA: Diagnosis present

## 2021-08-07 DIAGNOSIS — K219 Gastro-esophageal reflux disease without esophagitis: Secondary | ICD-10-CM | POA: Diagnosis present

## 2021-08-07 DIAGNOSIS — G2581 Restless legs syndrome: Secondary | ICD-10-CM | POA: Diagnosis present

## 2021-08-07 DIAGNOSIS — Z7902 Long term (current) use of antithrombotics/antiplatelets: Secondary | ICD-10-CM

## 2021-08-07 DIAGNOSIS — Z66 Do not resuscitate: Secondary | ICD-10-CM | POA: Diagnosis present

## 2021-08-07 DIAGNOSIS — D500 Iron deficiency anemia secondary to blood loss (chronic): Secondary | ICD-10-CM | POA: Diagnosis not present

## 2021-08-07 DIAGNOSIS — Z8673 Personal history of transient ischemic attack (TIA), and cerebral infarction without residual deficits: Secondary | ICD-10-CM

## 2021-08-07 DIAGNOSIS — D509 Iron deficiency anemia, unspecified: Secondary | ICD-10-CM | POA: Diagnosis present

## 2021-08-07 DIAGNOSIS — I130 Hypertensive heart and chronic kidney disease with heart failure and stage 1 through stage 4 chronic kidney disease, or unspecified chronic kidney disease: Principal | ICD-10-CM | POA: Diagnosis present

## 2021-08-07 DIAGNOSIS — Z931 Gastrostomy status: Secondary | ICD-10-CM

## 2021-08-07 DIAGNOSIS — I5043 Acute on chronic combined systolic (congestive) and diastolic (congestive) heart failure: Secondary | ICD-10-CM | POA: Diagnosis present

## 2021-08-07 DIAGNOSIS — E1122 Type 2 diabetes mellitus with diabetic chronic kidney disease: Secondary | ICD-10-CM | POA: Diagnosis present

## 2021-08-07 DIAGNOSIS — Z8249 Family history of ischemic heart disease and other diseases of the circulatory system: Secondary | ICD-10-CM

## 2021-08-07 DIAGNOSIS — Z85828 Personal history of other malignant neoplasm of skin: Secondary | ICD-10-CM | POA: Diagnosis not present

## 2021-08-07 DIAGNOSIS — I080 Rheumatic disorders of both mitral and aortic valves: Secondary | ICD-10-CM | POA: Diagnosis present

## 2021-08-07 DIAGNOSIS — M109 Gout, unspecified: Secondary | ICD-10-CM | POA: Diagnosis present

## 2021-08-07 DIAGNOSIS — D631 Anemia in chronic kidney disease: Secondary | ICD-10-CM | POA: Diagnosis present

## 2021-08-07 DIAGNOSIS — R0602 Shortness of breath: Principal | ICD-10-CM

## 2021-08-07 DIAGNOSIS — Z833 Family history of diabetes mellitus: Secondary | ICD-10-CM

## 2021-08-07 DIAGNOSIS — Z888 Allergy status to other drugs, medicaments and biological substances status: Secondary | ICD-10-CM | POA: Diagnosis not present

## 2021-08-07 DIAGNOSIS — E119 Type 2 diabetes mellitus without complications: Secondary | ICD-10-CM | POA: Diagnosis not present

## 2021-08-07 DIAGNOSIS — I10 Essential (primary) hypertension: Secondary | ICD-10-CM | POA: Diagnosis not present

## 2021-08-07 DIAGNOSIS — I509 Heart failure, unspecified: Secondary | ICD-10-CM

## 2021-08-07 DIAGNOSIS — M79675 Pain in left toe(s): Secondary | ICD-10-CM | POA: Diagnosis present

## 2021-08-07 DIAGNOSIS — E871 Hypo-osmolality and hyponatremia: Secondary | ICD-10-CM | POA: Diagnosis present

## 2021-08-07 DIAGNOSIS — I5021 Acute systolic (congestive) heart failure: Secondary | ICD-10-CM | POA: Diagnosis not present

## 2021-08-07 DIAGNOSIS — Z20822 Contact with and (suspected) exposure to covid-19: Secondary | ICD-10-CM | POA: Diagnosis present

## 2021-08-07 DIAGNOSIS — N1832 Chronic kidney disease, stage 3b: Secondary | ICD-10-CM | POA: Diagnosis present

## 2021-08-07 DIAGNOSIS — H9190 Unspecified hearing loss, unspecified ear: Secondary | ICD-10-CM | POA: Diagnosis present

## 2021-08-07 DIAGNOSIS — I5023 Acute on chronic systolic (congestive) heart failure: Secondary | ICD-10-CM

## 2021-08-07 DIAGNOSIS — E1165 Type 2 diabetes mellitus with hyperglycemia: Secondary | ICD-10-CM | POA: Diagnosis present

## 2021-08-07 DIAGNOSIS — E7801 Familial hypercholesterolemia: Secondary | ICD-10-CM | POA: Diagnosis present

## 2021-08-07 DIAGNOSIS — Z7982 Long term (current) use of aspirin: Secondary | ICD-10-CM

## 2021-08-07 LAB — BASIC METABOLIC PANEL
Anion gap: 8 (ref 5–15)
BUN: 44 mg/dL — ABNORMAL HIGH (ref 8–23)
CO2: 28 mmol/L (ref 22–32)
Calcium: 8.6 mg/dL — ABNORMAL LOW (ref 8.9–10.3)
Chloride: 91 mmol/L — ABNORMAL LOW (ref 98–111)
Creatinine, Ser: 1.07 mg/dL — ABNORMAL HIGH (ref 0.44–1.00)
GFR, Estimated: 49 mL/min — ABNORMAL LOW (ref >60)
Glucose, Bld: 170 mg/dL — ABNORMAL HIGH (ref 70–99)
Potassium: 5 mmol/L (ref 3.5–5.1)
Sodium: 127 mmol/L — ABNORMAL LOW (ref 135–145)

## 2021-08-07 LAB — BRAIN NATRIURETIC PEPTIDE: B Natriuretic Peptide: 1311.1 pg/mL — ABNORMAL HIGH (ref 0.0–100.0)

## 2021-08-07 LAB — CBC
HCT: 27.4 % — ABNORMAL LOW (ref 36.0–46.0)
Hemoglobin: 8.8 g/dL — ABNORMAL LOW (ref 12.0–15.0)
MCH: 30.4 pg (ref 26.0–34.0)
MCHC: 32.1 g/dL (ref 30.0–36.0)
MCV: 94.8 fL (ref 80.0–100.0)
Platelets: 361 10*3/uL (ref 150–400)
RBC: 2.89 MIL/uL — ABNORMAL LOW (ref 3.87–5.11)
RDW: 15 % (ref 11.5–15.5)
WBC: 7.9 10*3/uL (ref 4.0–10.5)
nRBC: 0 % (ref 0.0–0.2)

## 2021-08-07 LAB — RESP PANEL BY RT-PCR (FLU A&B, COVID) ARPGX2
Influenza A by PCR: NEGATIVE
Influenza B by PCR: NEGATIVE
SARS Coronavirus 2 by RT PCR: NEGATIVE

## 2021-08-07 LAB — TROPONIN I (HIGH SENSITIVITY): Troponin I (High Sensitivity): 27 ng/L — ABNORMAL HIGH (ref <18)

## 2021-08-07 MED ORDER — FUROSEMIDE 10 MG/ML IJ SOLN
20.0000 mg | Freq: Once | INTRAMUSCULAR | Status: AC
  Administered 2021-08-07: 20 mg via INTRAVENOUS
  Filled 2021-08-07: qty 4

## 2021-08-07 NOTE — ED Notes (Signed)
First nurse-pt brought in via ems from home.  Alert x 4.  Hx chf.  Pt has sob.  Pt gained 3 pounds in past 24 hours.  Pt is on 2 liters oxygen [place].  Breathing treatment given by ems with some improvement.  Iv in place.  Hx afib.

## 2021-08-07 NOTE — ED Triage Notes (Signed)
C/O SOB today, but intermittent x months.. Caregiver reports 3 lb weight gain today. Hx CHF.  Patient also c/o weakness.

## 2021-08-07 NOTE — ED Notes (Signed)
Pt resting comfortably in bed, NAD. No needs verbalized at this time. Daughter at [facility]. Bed low & locked; call light & personal items within reach.

## 2021-08-07 NOTE — ED Notes (Signed)
Covid swab sent to lab.

## 2021-08-07 NOTE — ED Provider Notes (Signed)
[facility] Emergency Department Provider Note    Event Date/Time   First MD Initiated Contact with Patient 08/07/21 2015     (approximate)  I have reviewed the triage vital signs and the nursing notes.   HISTORY  Chief Complaint Shortness of Breath    HPI [name] Day is a 87 y.o. female below listed past medical history presents to the ER for evaluation of shortness of breath.  Reportedly having episodes of hypoxia down in the 80s today.  Does not wear home oxygen.  Sent extensive history of CHF and they just recently increased her Lasix as an outpatient.  Having persistent worsening shortness of breath as well as leg edema had 3 pound weight increase in 24 hours.  She is primarily fed by G-tube states that roughly consume 1 L of water daily.  No fevers cough or congestion.  Past Medical History:  Diagnosis Date   Anemia    IRON INFUSIONS   Aortic valve sclerosis    Arrhythmia    Arthritis    osteoarthritis   Basal cell carcinoma of skin    Brain tumor (HCC)    Brain tumor (HCC)    Cervical spine disease    CHF (congestive heart failure) (HCC)    Diabetes mellitus without complication (HCC)    Dysrhythmia    sinus arrhythmia   Esophageal stricture    severe, led to feeding tube placement in Sept 2019   Esophageal ulcer without bleeding    Gastrostomy tube dependent ([place])    DOES NOT EAT OR DRINK    GERD (gastroesophageal reflux disease)    Hearing loss    pt had hearing test  last month, left ear30% hearing due to stroke, 78% in right-normal for her age   History of kidney stones    Hyperlipemia    Hypertension    Kidney stones    Leaky heart valve    Meningioma (HCC)    Occlusive mesenteric ischemia (HCC)    Pulmonary hypertension (HCC)    RLS (restless legs syndrome)    Stroke (HCC)    Vertigo    Family History  Problem Relation Age of Onset   Stroke Mother    Hypertension Mother    Heart disease Mother    Cancer Father     Heart disease Father    Heart attack Sister    Diabetes Sister    Heart attack Brother    Thyroid cancer Daughter    Cancer - Colon Daughter    Past Surgical History:  Procedure Laterality Date   ABDOMINAL HYSTERECTOMY     APPENDECTOMY     ARTHROGRAM KNEE Left    BACK SURGERY     CERVIVAL NECK FUSION   CATARACT EXTRACTION W/PHACO Left 11/11/2018   Procedure: CATARACT EXTRACTION PHACO AND INTRAOCULAR LENS PLACEMENT ([place]) LEFT, DIABETIC;  Surgeon: Birder Robson, MD;  Location: [facility] ORS;  Service: Ophthalmology;  Laterality: Left;  [place]  00:41 CDE 6.41 Fluid pack lot # [id number]   COLONOSCOPY WITH PROPOFOL N/A 06/20/2017   Procedure: COLONOSCOPY WITH PROPOFOL;  Surgeon: Lollie Sails, MD;  Location: [facility] ENDOSCOPY;  Service: Endoscopy;  Laterality: N/A;   ESOPHAGOGASTRODUODENOSCOPY (EGD) WITH PROPOFOL N/A 03/14/2015   Procedure: ESOPHAGOGASTRODUODENOSCOPY (EGD) WITH PROPOFOL;  Surgeon: Josefine Class, MD;  Location: [facility] ENDOSCOPY;  Service: Endoscopy;  Laterality: N/A;   ESOPHAGOGASTRODUODENOSCOPY (EGD) WITH PROPOFOL N/A 03/28/2017   Procedure: ESOPHAGOGASTRODUODENOSCOPY (EGD) WITH PROPOFOL;  Surgeon: Lollie Sails, MD;  Location:  [place] ENDOSCOPY;  Service: Endoscopy;  Laterality: N/A;   ESOPHAGOGASTRODUODENOSCOPY (EGD) WITH PROPOFOL N/A 06/20/2017   Procedure: ESOPHAGOGASTRODUODENOSCOPY (EGD) WITH PROPOFOL;  Surgeon: Lollie Sails, MD;  Location: [facility] ENDOSCOPY;  Service: Endoscopy;  Laterality: N/A;   ESOPHAGOGASTRODUODENOSCOPY (EGD) WITH PROPOFOL N/A 10/21/2017   Procedure: ESOPHAGOGASTRODUODENOSCOPY (EGD) WITH PROPOFOL;  Surgeon: Lollie Sails, MD;  Location: [facility] ENDOSCOPY;  Service: Endoscopy;  Laterality: N/A;   ESOPHAGOGASTRODUODENOSCOPY (EGD) WITH PROPOFOL N/A 04/15/2018   Procedure: ESOPHAGOGASTRODUODENOSCOPY (EGD) WITH PROPOFOL;  Surgeon: Lollie Sails, MD;  Location: [facility] ENDOSCOPY;  Service: Endoscopy;  Laterality: N/A;   ESOPHAGUS SURGERY      CLOSURE   EYE SURGERY     HYSTERECTOMY ABDOMINAL WITH SALPINGECTOMY     IR GASTROSTOMY TUBE MOD SED  06/11/2018   IR GASTROSTOMY TUBE REMOVAL  03/25/2019   IR REPLACE G-TUBE SIMPLE WO FLUORO  10/19/2020   PERIPHERAL VASCULAR CATHETERIZATION N/A 01/23/2016   Procedure: Visceral Venography;  Surgeon: Algernon Huxley, MD;  Location: [place] CV LAB;  Service: Cardiovascular;  Laterality: N/A;   PERIPHERAL VASCULAR CATHETERIZATION  01/23/2016   Procedure: Peripheral Vascular Intervention;  Surgeon: Algernon Huxley, MD;  Location: [place] CV LAB;  Service: Cardiovascular;;   UPPER ESOPHAGEAL ENDOSCOPIC ULTRASOUND (EUS) N/A 12/05/2017   Procedure: UPPER ESOPHAGEAL ENDOSCOPIC ULTRASOUND (EUS);  Surgeon: Jola Schmidt, MD;  Location: [facility] ENDOSCOPY;  Service: Endoscopy;  Laterality: N/A;   VISCERAL ANGIOGRAPHY N/A 03/18/2017   Procedure: Visceral Angiography;  Surgeon: Algernon Huxley, MD;  Location: [place] CV LAB;  Service: Cardiovascular;  Laterality: N/A;   VISCERAL ARTERY INTERVENTION N/A 03/18/2017   Procedure: Visceral Artery Intervention;  Surgeon: Algernon Huxley, MD;  Location: [place] CV LAB;  Service: Cardiovascular;  Laterality: N/A;   Patient Active Problem List   Diagnosis Date Noted   History of CVA (cerebrovascular accident) 07/26/2021   Chronic anemia 02/15/2021   HTN (hypertension) 02/15/2021   Stroke ([place]) 02/15/2021   Hyperkalemia 02/15/2021   AKI (acute kidney injury) ([place]) 02/15/2021   Acute on chronic combined systolic and diastolic CHF (congestive heart failure) ([place]) 02/15/2021   Abnormality of gait 01/19/2021   Malnutrition of moderate degree 12/29/2020   Pressure injury of skin 12/29/2020   Anemia of chronic disease    Essential hypertension    Controlled type 2 diabetes mellitus with hyperglycemia, without long-term current use of insulin (HCC)    Hypoalbuminemia due to protein-calorie malnutrition ([place])    Stroke (cerebrum) ([place]) 12/22/2020   Abdominal pain,  lower    Cerebrovascular accident (CVA) due to occlusion of left middle cerebral artery (HCC)    Right sided weakness    Weakness    Respiratory failure with hypoxia ([place]) 11/23/2019   CHF exacerbation ([place]) 11/23/2019   HFrEF (heart failure with reduced ejection fraction) ([place]) 11/23/2019   GERD (gastroesophageal reflux disease)    Gastrostomy tube dependent ([place])    Esophageal ulcer without bleeding    Type II diabetes mellitus with renal manifestations (HCC)    Anemia    Aortic valve disease 08/24/2019   Gastrostomy tube in place ([place]) 07/31/2019   RLS (restless legs syndrome) 07/31/2019   Chest pain, atypical 03/18/2019   Tricuspid regurgitation 02/13/2019   Heart palpitations 02/12/2019   Hyponatremia 06/23/2018   Esophageal stricture 06/11/2018   Weight loss 05/29/2018   Dysphagia 05/29/2018   Esophageal stenosis 05/05/2018   Hyperlipemia 04/04/2018   Itching 01/08/2018   Medication monitoring encounter 01/08/2018   Paronychia of toe 01/08/2018  Esophagitis, CMV ([place]) 01/01/2018   Occlusive mesenteric ischemia ([place]) 05/28/2017   Hypertension 04/30/2017   Mesenteric ischemia ([place]) 03/01/2017   Iron deficiency anemia 02/28/2017   PAD (peripheral artery disease) ([place]) 02/05/2017   Hyperlipidemia type II 04/04/2016   Pulmonary hypertension ([place]) 01/04/2015   Generalized OA 12/13/2014   Leg edema 12/13/2014   Aortic valve sclerosis 02/19/2014   Type 2 diabetes mellitus with hyperlipidemia ([place]) 12/25/2013   Acute, but ill-defined, cerebrovascular disease 12/25/2013   Unilateral primary osteoarthritis, unspecified knee 12/25/2013   Personal history of other malignant neoplasm of skin 05/28/2012      Prior to Admission medications   Medication Sig Start Date End Date Taking? Authorizing Provider  acetaminophen (TYLENOL) 160 MG/5ML solution Place 20.3 mLs (650 mg total) into feeding tube every 6 (six) hours as needed for mild pain. 06/13/18   Demetrios Loll, MD  allopurinol  (ZYLOPRIM) 100 MG tablet Take by mouth. 05/26/21 11/22/21  [provider]  ascorbic acid (VITAMIN C) 250 MG tablet Take 1 tablet by G tube 2 (two) times daily Patient not taking: No sig reported 06/13/18   [provider]  aspirin 81 MG chewable tablet Place 1 tablet (81 mg total) into feeding tube daily. 12/22/20   Regalado, Belkys A, MD  Bromfenac Sodium 0.09 % SOLN Place 1 drop into the left eye 2 (two) times daily. 06/15/21   [provider]  carvedilol (COREG) 3.125 MG tablet Place 1 tablet (3.125 mg total) into feeding tube 2 (two) times daily with a meal. 12/22/20   Regalado, Belkys A, MD  clopidogrel (PLAVIX) 75 MG tablet GIVE 1 TABLET VIA FEEDING TUBE EVERY DAY AS DIRECTED 12/30/20   Love, Ivan Anchors, PA-C  furosemide (LASIX) 20 MG tablet Place 0.5 tablets (10 mg total) into feeding tube daily. Patient taking differently: Place 20 mg into feeding tube 2 (two) times daily. 12/30/20   Love, Ivan Anchors, PA-C  glipiZIDE (GLUCOTROL XL) 2.5 MG 24 hr tablet Take 2.5-5 mg by mouth in the morning and at bedtime. 07/21/21 01/17/22  [provider]  hydrocerin (EUCERIN) CREA Apply 1 application topically 2 (two) times daily. To bilateral feet 12/27/20   Love, Ivan Anchors, PA-C  Hypromellose 0.2 % SOLN Place 1 drop into both eyes 3 (three) times daily as needed (dry eyes).    [provider]  lisinopril (ZESTRIL) 2.5 MG tablet Take 1 tablet by mouth daily. 07/21/21 01/17/22  [provider]  loperamide (IMODIUM) 1 MG/5ML solution Take 1 mg by mouth as needed for diarrhea or loose stools.    [provider]  metFORMIN (GLUCOPHAGE) 500 MG tablet Place 1 tablet (500 mg total) into feeding tube 2 (two) times daily with a meal. 02/17/21   Fritzi Mandes, MD  Multiple Vitamin (MULTIVITAMIN) LIQD Place 15 mLs into feeding tube daily. 06/13/18   Demetrios Loll, MD  Nutritional Supplements (FEEDING SUPPLEMENT, KATE FARMS STANDARD 1.4,) LIQD liquid Place 325 mLs into feeding  tube 4 (four) times daily. Patient taking differently: Place 325 mLs into feeding tube 3 (three) times daily between meals. 12/30/20   Love, Ivan Anchors, PA-C  pantoprazole sodium (PROTONIX) 40 mg/20 mL PACK Place 20 mLs (40 mg total) into feeding tube daily. 12/30/20   Love, Ivan Anchors, PA-C  polyethylene glycol (MIRALAX / GLYCOLAX) 17 g packet Place 17 g into feeding tube daily as needed. 12/30/20   Love, Ivan Anchors, PA-C  pregabalin (LYRICA) 25 MG capsule Take by mouth. 07/24/21   [provider]  rOPINIRole (REQUIP) 2 MG tablet Place 1 tablet (2 mg total) into feeding tube 2 (two) times daily. 12/30/20   Love, Ivan Anchors, PA-C  Water For Irrigation, Sterile (FREE WATER) SOLN Place 100 mLs into feeding tube every 6 (six) hours. 12/30/20   Love, Ivan Anchors, PA-C    Allergies Elemental sulfur, Gabapentin, Lipitor [atorvastatin], Mevacor [lovastatin], Milk-related compounds, Septra [sulfamethoxazole-trimethoprim], Statins, and Zocor [simvastatin]    Social History Social History   Tobacco Use   Smoking status: Never   Smokeless tobacco: Never  Vaping Use   Vaping Use: Never used  Substance Use Topics   Alcohol use: Never   Drug use: Never    Review of Systems Patient denies headaches, rhinorrhea, blurry vision, numbness, shortness of breath, chest pain, edema, cough, abdominal pain, nausea, vomiting, diarrhea, dysuria, fevers, rashes or hallucinations unless otherwise stated above in HPI. ____________________________________________   PHYSICAL EXAM:  VITAL SIGNS: Vitals:   08/07/21 2108 08/07/21 2109  BP: 139/62 (!) 138/50  Pulse:  65  Resp: 19 18  Temp:    SpO2:  97%    Constitutional: Alert and oriented. Frail and chronically ill appearing Eyes: Conjunctivae are normal.  Head: Atraumatic. Nose: No congestion/rhinnorhea. Mouth/Throat: Mucous membranes are moist.   Neck: No stridor. Painless ROM.  Cardiovascular: Normal rate, regular rhythm. Grossly normal heart sounds.  Good  peripheral circulation. Respiratory: mild tachypnea with diminished posterior bs. Gastrointestinal: Soft and nontender. No distention. No abdominal bruits. No CVA tenderness. Genitourinary:  Musculoskeletal: No lower extremity tenderness. BLE edema.  No joint effusions. Neurologic:  Normal speech and language. No gross focal neurologic deficits are appreciated. No facial droop Skin:  Skin is warm, dry and intact. No rash noted. Psychiatric: Mood and affect are normal. Speech and behavior are normal.  ____________________________________________   LABS (all labs ordered are listed, but only abnormal results are displayed)  Results for orders placed or performed during the hospital encounter of 08/07/21 (from the past 24 hour(s))  Basic metabolic panel     Status: Abnormal   Collection Time: 08/07/21  4:45 PM  Result Value Ref Range   Sodium 127 (L) 135 - 145 mmol/L   Potassium 5.0 3.5 - 5.1 mmol/L   Chloride 91 (L) 98 - 111 mmol/L   CO2 28 22 - 32 mmol/L   Glucose, Bld 170 (H) 70 - 99 mg/dL   BUN 44 (H) 8 - 23 mg/dL   Creatinine, Ser 1.07 (H) 0.44 - 1.00 mg/dL   Calcium 8.6 (L) 8.9 - 10.3 mg/dL   GFR, Estimated 49 (L) >60 mL/min   Anion gap 8 5 - 15  CBC     Status: Abnormal   Collection Time: 08/07/21  4:45 PM  Result Value Ref Range   WBC 7.9 4.0 - 10.5 K/uL   RBC 2.89 (L) 3.87 - 5.11 MIL/uL   Hemoglobin 8.8 (L) 12.0 - 15.0 g/dL   HCT 27.4 (L) 36.0 - 46.0 %   MCV 94.8 80.0 - 100.0 fL   MCH 30.4 26.0 - 34.0 pg   MCHC 32.1 30.0 - 36.0 g/dL   RDW 15.0 11.5 - 15.5 %   Platelets 361 150 - 400 K/uL   nRBC 0.0 0.0 - 0.2 %  Brain natriuretic peptide     Status: Abnormal   Collection Time: 08/07/21  4:45 PM  Result Value Ref Range   B Natriuretic Peptide 1,311.1 (H) 0.0 - 100.0 pg/mL  Troponin I (High Sensitivity)     Status: Abnormal  Collection Time: 08/07/21  4:45 PM  Result Value Ref Range   Troponin I (High Sensitivity) 27 (H) <18 ng/L  Resp Panel by RT-PCR (Flu A&B,  Covid) Nasopharyngeal Swab     Status: None   Collection Time: 08/07/21  8:49 PM   Specimen: Nasopharyngeal Swab; Nasopharyngeal(NP) swabs in vial transport medium  Result Value Ref Range   SARS Coronavirus 2 by RT PCR NEGATIVE NEGATIVE   Influenza A by PCR NEGATIVE NEGATIVE   Influenza B by PCR NEGATIVE NEGATIVE   ____________________________________________  EKG My review and personal interpretation at Time: 16:45   Indication: sob  Rate: 75  Rhythm: sinus Axis: right Other: nonspecific st and t wave abn ____________________________________________  RADIOLOGY  I personally reviewed all radiographic images ordered to evaluate for the above acute complaints and reviewed radiology reports and findings.  These findings were personally discussed with the patient.  Please see medical record for radiology report.  ____________________________________________   PROCEDURES  Procedure(s) performed:  Procedures    Critical Care performed: no ____________________________________________   INITIAL IMPRESSION / ASSESSMENT AND PLAN / ED COURSE  Pertinent labs & imaging results that were available during my care of the patient were reviewed by me and considered in my medical decision making (see chart for details).   DDX: Asthma, copd, CHF, pna, ptx, malignancy, Pe, anemia   [name] Day is a 87 y.o. who presents to the ED with symptoms as described above.  Patient with extensive past medical history advanced CHF not on home oxygen reportedly having episodes of hypoxia to the 80s at home.  Having worsening symptoms and findings consistent with CHF.  Presentation somewhat complicated by her hyponatremia and failing outpatient diuretics.  She is not requiring oxygen at this time but does appear short of breath.  Based on her age and frailty factors failing outpatient management do feel observation the hospital for gentle IV diuresis and possibly fluid restriction for repeat blood work  and assessment as clinically indicated.  Will discuss with hospitalist.     The patient was evaluated in Emergency Department today for the symptoms described in the history of present illness. He/she was evaluated in the context of the global COVID-19 pandemic, which necessitated consideration that the patient might be at risk for infection with the SARS-CoV-2 virus that causes COVID-19. Institutional protocols and algorithms that pertain to the evaluation of patients at risk for COVID-19 are in a state of rapid change based on information released by regulatory bodies including the CDC and federal and state organizations. These policies and algorithms were followed during the patient's care in the ED.  As part of my medical decision making, I reviewed the following data within the [facility] notes reviewed and incorporated, Labs reviewed, notes from prior ED visits and [place] Controlled Substance Database   ____________________________________________   FINAL CLINICAL IMPRESSION(S) / ED DIAGNOSES  Final diagnoses:  Shortness of breath  Acute on chronic congestive heart failure, unspecified heart failure type ([place])      NEW MEDICATIONS STARTED DURING THIS VISIT:  New Prescriptions   No medications on file     Note:  This document was prepared using Dragon voice recognition software and may include unintentional dictation errors.    Merlyn Lot, MD 08/07/21 2157

## 2021-08-07 NOTE — ED Provider Notes (Signed)
Emergency Medicine Provider Triage Evaluation Note  [name] , a 87 y.o. female  was evaluated in triage.  Pt complains of shortness of breath.  Patient has a history of CHF and comes from home via EMS for worsening shortness of breath.  Family member reports of cough.  No confusion.  No complaints of chest pain, abdominal pain, fevers  Review of Systems  Positive: Cough, shortness of breath Negative: Lower extremity swelling, fevers  Physical Exam  There were no vitals taken for this visit. Gen:   Awake, no distress presents in wheelchai Resp:  Normal effort  MSK:   Moves extremities without difficulty  Other:    Medical Decision Making  Medically screening exam initiated at 4:38 PM.  Appropriate orders placed.  [name] was informed that the remainder of the evaluation will be completed by another provider, this initial triage assessment does not replace that evaluation, and the importance of remaining in the ED until their evaluation is complete.     Duanne Guess, PA-C 08/07/21 1646    Arta Silence, MD 08/07/21 1851

## 2021-08-07 NOTE — H&P (Signed)
History and Physical   [name] GYI:[id number] DOB: 10/19/1928 DOA: 08/07/2021  Referring MD/NP/PA: Dr. Quentin Cornwall  PCP: [name], MD   Outpatient Specialists: None  Patient coming from: Home  Chief Complaint: Shortness of breath  HPI: [name] is a 87 y.o. female with medical history significant of diastolic dysfunction CHF, diabetes, basal cell carcinoma, PEG tube dependent, history of esophageal cancer, history of esophageal stricture, GERD, pulmonary hypertension, kidney stones, essential hypertension and hyperlipidemia who presents with shortness of breath from home.  Patient has gained about 3 pounds in the last 24 hours.  She is on 2 L of oxygen.  EMS gave breathing treatment but still short of breath.  Appears to be fluid overloaded.  Patient is hyponatremic as well as hypochloremic.  Patient has chronic anemia but hemoglobin appears to be at baseline.  No fever or chills.  BNP is 1311.  Creatinine is slightly up to 1.07.  She gets free water boluses through the PEG tube which has not been restricted..  ED Course: Temperature 98.1 blood pressure 116/53, pulse 71 respiratory of 19 on oxygen sats 97% on room air.  White count 7.9 hemoglobin 8.9 platelets 361.  Sodium 127 potassium 5.0 chloride 91 CO2 28, BUN 44, creatinine 1.07.  Calcium 8.6.  Chest x-ray shows mild pulmonary edema with slightly increased interval increase in small volume pleural effusions.  Patient has been admitted.  Acute exacerbation of CHF  Review of Systems: As per HPI otherwise 10 point review of systems negative.    Past Medical History:  Diagnosis Date   Anemia    IRON INFUSIONS   Aortic valve sclerosis    Arrhythmia    Arthritis    osteoarthritis   Basal cell carcinoma of skin    Brain tumor (HCC)    Brain tumor ([place])    Cervical spine disease    CHF (congestive heart failure) (HCC)    Diabetes mellitus without complication (HCC)    Dysrhythmia    sinus arrhythmia    Esophageal stricture    severe, led to feeding tube placement in Sept 2019   Esophageal ulcer without bleeding    Gastrostomy tube dependent ([place])    DOES NOT EAT OR DRINK    GERD (gastroesophageal reflux disease)    Hearing loss    pt had hearing test  last month, left ear30% hearing due to stroke, 78% in right-normal for her age   History of kidney stones    Hyperlipemia    Hypertension    Kidney stones    Leaky heart valve    Meningioma (HCC)    Occlusive mesenteric ischemia (HCC)    Pulmonary hypertension (HCC)    RLS (restless legs syndrome)    Stroke ([place])    Vertigo     Past Surgical History:  Procedure Laterality Date   ABDOMINAL HYSTERECTOMY     APPENDECTOMY     ARTHROGRAM KNEE Left    BACK SURGERY     CERVIVAL NECK FUSION   CATARACT EXTRACTION W/PHACO Left 11/11/2018   Procedure: CATARACT EXTRACTION PHACO AND INTRAOCULAR LENS PLACEMENT ([place]) LEFT, DIABETIC;  Surgeon: Birder Robson, MD;  Location: [facility] ORS;  Service: Ophthalmology;  Laterality: Left;  [place]  00:41 CDE 6.41 Fluid pack lot # [id number]   COLONOSCOPY WITH PROPOFOL N/A 06/20/2017   Procedure: COLONOSCOPY WITH PROPOFOL;  Surgeon: Lollie Sails, MD;  Location: [facility] ENDOSCOPY;  Service: Endoscopy;  Laterality: N/A;   ESOPHAGOGASTRODUODENOSCOPY (EGD) WITH PROPOFOL N/A 03/14/2015  Procedure: ESOPHAGOGASTRODUODENOSCOPY (EGD) WITH PROPOFOL;  Surgeon: Josefine Class, MD;  Location: [facility] ENDOSCOPY;  Service: Endoscopy;  Laterality: N/A;   ESOPHAGOGASTRODUODENOSCOPY (EGD) WITH PROPOFOL N/A 03/28/2017   Procedure: ESOPHAGOGASTRODUODENOSCOPY (EGD) WITH PROPOFOL;  Surgeon: Lollie Sails, MD;  Location: [facility] ENDOSCOPY;  Service: Endoscopy;  Laterality: N/A;   ESOPHAGOGASTRODUODENOSCOPY (EGD) WITH PROPOFOL N/A 06/20/2017   Procedure: ESOPHAGOGASTRODUODENOSCOPY (EGD) WITH PROPOFOL;  Surgeon: Lollie Sails, MD;  Location: [facility] ENDOSCOPY;  Service: Endoscopy;  Laterality: N/A;   ESOPHAGOGASTRODUODENOSCOPY  (EGD) WITH PROPOFOL N/A 10/21/2017   Procedure: ESOPHAGOGASTRODUODENOSCOPY (EGD) WITH PROPOFOL;  Surgeon: Lollie Sails, MD;  Location: [facility] ENDOSCOPY;  Service: Endoscopy;  Laterality: N/A;   ESOPHAGOGASTRODUODENOSCOPY (EGD) WITH PROPOFOL N/A 04/15/2018   Procedure: ESOPHAGOGASTRODUODENOSCOPY (EGD) WITH PROPOFOL;  Surgeon: Lollie Sails, MD;  Location: [facility] ENDOSCOPY;  Service: Endoscopy;  Laterality: N/A;   ESOPHAGUS SURGERY     CLOSURE   EYE SURGERY     HYSTERECTOMY ABDOMINAL WITH SALPINGECTOMY     IR GASTROSTOMY TUBE MOD SED  06/11/2018   IR GASTROSTOMY TUBE REMOVAL  03/25/2019   IR REPLACE G-TUBE SIMPLE WO FLUORO  10/19/2020   PERIPHERAL VASCULAR CATHETERIZATION N/A 01/23/2016   Procedure: Visceral Venography;  Surgeon: Algernon Huxley, MD;  Location: [place] CV LAB;  Service: Cardiovascular;  Laterality: N/A;   PERIPHERAL VASCULAR CATHETERIZATION  01/23/2016   Procedure: Peripheral Vascular Intervention;  Surgeon: Algernon Huxley, MD;  Location: [place] CV LAB;  Service: Cardiovascular;;   UPPER ESOPHAGEAL ENDOSCOPIC ULTRASOUND (EUS) N/A 12/05/2017   Procedure: UPPER ESOPHAGEAL ENDOSCOPIC ULTRASOUND (EUS);  Surgeon: Jola Schmidt, MD;  Location: [facility] ENDOSCOPY;  Service: Endoscopy;  Laterality: N/A;   VISCERAL ANGIOGRAPHY N/A 03/18/2017   Procedure: Visceral Angiography;  Surgeon: Algernon Huxley, MD;  Location: [place] CV LAB;  Service: Cardiovascular;  Laterality: N/A;   VISCERAL ARTERY INTERVENTION N/A 03/18/2017   Procedure: Visceral Artery Intervention;  Surgeon: Algernon Huxley, MD;  Location: [place] CV LAB;  Service: Cardiovascular;  Laterality: N/A;     reports that she has never smoked. She has never used smokeless tobacco. She reports that she does not drink alcohol and does not use drugs.  Allergies  Allergen Reactions   Elemental Sulfur Diarrhea and Nausea And Vomiting   Gabapentin Swelling   Lipitor [Atorvastatin] Other (See Comments)    Muscle aches    Mevacor [Lovastatin] Other (See Comments)    Muscle aches   Milk-Related Compounds Other (See Comments)    Large quantities cause headaches    Septra [Sulfamethoxazole-Trimethoprim] Other (See Comments)    Unknown   Statins Other (See Comments)    Muscle pain   Zocor [Simvastatin] Other (See Comments)    Muscle aches    Family History  Problem Relation Age of Onset   Stroke Mother    Hypertension Mother    Heart disease Mother    Cancer Father    Heart disease Father    Heart attack Sister    Diabetes Sister    Heart attack Brother    Thyroid cancer Daughter    Cancer - Colon Daughter      Prior to Admission medications   Medication Sig Start Date End Date Taking? Authorizing Provider  acetaminophen (TYLENOL) 160 MG/5ML solution Place 20.3 mLs (650 mg total) into feeding tube every 6 (six) hours as needed for mild pain. 06/13/18   Demetrios Loll, MD  allopurinol (ZYLOPRIM) 100 MG tablet Take by mouth. 05/26/21 11/22/21  [provider]  ascorbic acid (VITAMIN C) 250 MG tablet Take 1 tablet by G tube 2 (two) times daily Patient not taking: No sig reported 06/13/18   [provider]  aspirin 81 MG chewable tablet Place 1 tablet (81 mg total) into feeding tube daily. 12/22/20   Regalado, Belkys A, MD  Bromfenac Sodium 0.09 % SOLN Place 1 drop into the left eye 2 (two) times daily. 06/15/21   [provider]  carvedilol (COREG) 3.125 MG tablet Place 1 tablet (3.125 mg total) into feeding tube 2 (two) times daily with a meal. 12/22/20   Regalado, Belkys A, MD  clopidogrel (PLAVIX) 75 MG tablet GIVE 1 TABLET VIA FEEDING TUBE EVERY [name] AS DIRECTED 12/30/20   Love, Ivan Anchors, PA-C  furosemide (LASIX) 20 MG tablet Place 0.5 tablets (10 mg total) into feeding tube daily. Patient taking differently: Place 20 mg into feeding tube 2 (two) times daily. 12/30/20   Love, Ivan Anchors, PA-C  glipiZIDE (GLUCOTROL XL) 2.5 MG 24 hr tablet Take 2.5-5 mg by mouth in the morning and at  bedtime. 07/21/21 01/17/22  [provider]  hydrocerin (EUCERIN) CREA Apply 1 application topically 2 (two) times daily. To bilateral feet 12/27/20   Love, Ivan Anchors, PA-C  Hypromellose 0.2 % SOLN Place 1 drop into both eyes 3 (three) times daily as needed (dry eyes).    [provider]  lisinopril (ZESTRIL) 2.5 MG tablet Take 1 tablet by mouth daily. 07/21/21 01/17/22  [provider]  loperamide (IMODIUM) 1 MG/5ML solution Take 1 mg by mouth as needed for diarrhea or loose stools.    [provider]  metFORMIN (GLUCOPHAGE) 500 MG tablet Place 1 tablet (500 mg total) into feeding tube 2 (two) times daily with a meal. 02/17/21   Fritzi Mandes, MD  Multiple Vitamin (MULTIVITAMIN) LIQD Place 15 mLs into feeding tube daily. 06/13/18   Demetrios Loll, MD  Nutritional Supplements (FEEDING SUPPLEMENT, KATE [place]MS STANDARD 1.4,) LIQD liquid Place 325 mLs into feeding tube 4 (four) times daily. Patient taking differently: Place 325 mLs into feeding tube 3 (three) times daily between meals. 12/30/20   Love, Ivan Anchors, PA-C  pantoprazole sodium (PROTONIX) 40 mg/20 mL PACK Place 20 mLs (40 mg total) into feeding tube daily. 12/30/20   Love, Ivan Anchors, PA-C  polyethylene glycol (MIRALAX / GLYCOLAX) 17 g packet Place 17 g into feeding tube daily as needed. 12/30/20   Love, Ivan Anchors, PA-C  pregabalin (LYRICA) 25 MG capsule Take by mouth. 07/24/21   [provider]  rOPINIRole (REQUIP) 2 MG tablet Place 1 tablet (2 mg total) into feeding tube 2 (two) times daily. 12/30/20   Love, Ivan Anchors, PA-C  Water For Irrigation, Sterile (FREE WATER) SOLN Place 100 mLs into feeding tube every 6 (six) hours. 12/30/20   Bary Leriche, PA-C    Physical Exam: Vitals:   08/07/21 1641 08/07/21 1644 08/07/21 2108 08/07/21 2109  BP:  (!) 116/53 139/62 (!) 138/50  Pulse:  71  65  Resp:  18 19 18   Temp:  98.1 F (36.7 C)    TempSrc:  Oral    SpO2:  98%  97%  Weight: 51.7 kg     Height: 5\' 5"  (1.651 m)          Constitutional: Chronically ill looking, fluid overloaded, no distress Vitals:   08/07/21 1641 08/07/21 1644 08/07/21 2108 08/07/21 2109  BP:  (!) 116/53 139/62 (!) 138/50  Pulse:  71  65  Resp:  18  19 18  Temp:  98.1 F (36.7 C)    TempSrc:  Oral    SpO2:  98%  97%  Weight: 51.7 kg     Height: 5\' 5"  (1.651 m)      Eyes: PERRL, lids and conjunctivae normal ENMT: Mucous membranes are moist. Posterior pharynx clear of any exudate or lesions.Normal dentition.  Neck: normal, supple, no masses, no thyromegaly Respiratory: Decreased air entry bilaterally with some diffuse crackles.  Normal respiratory effort. No accessory muscle use.  Cardiovascular: Regular rate and rhythm, no murmurs / rubs / gallops.  2+ extremity edema. 2+ pedal pulses. No carotid bruits.  Abdomen: no tenderness, no masses palpated. No hepatosplenomegaly. Bowel sounds positive.  Musculoskeletal: no clubbing / cyanosis. No joint deformity upper and lower extremities. Good ROM, no contractures. Normal muscle tone.  Skin: no rashes, lesions, ulcers. No induration Neurologic: CN 2-12 grossly intact. Sensation intact, DTR normal. Strength 5/5 in all 4.  Psychiatric: Slightly confused. Alert and oriented x 3. Normal mood.     Labs on Admission: I have personally reviewed following labs and imaging studies  CBC: Recent Labs  Lab 08/07/21 1645  WBC 7.9  HGB 8.8*  HCT 27.4*  MCV 94.8  PLT 373   Basic Metabolic Panel: Recent Labs  Lab 08/07/21 1645  NA 127*  K 5.0  CL 91*  CO2 28  GLUCOSE 170*  BUN 44*  CREATININE 1.07*  CALCIUM 8.6*   GFR: Estimated Creatinine Clearance: 28.5 mL/min (A) (by C-G formula based on SCr of 1.07 mg/dL (H)). Liver Function Tests: No results for input(s): AST, ALT, ALKPHOS, BILITOT, PROT, ALBUMIN in the last 168 hours. No results for input(s): LIPASE, AMYLASE in the last 168 hours. No results for input(s): AMMONIA in the last 168 hours. Coagulation Profile: No results  for input(s): INR, PROTIME in the last 168 hours. Cardiac Enzymes: No results for input(s): CKTOTAL, CKMB, CKMBINDEX, TROPONINI in the last 168 hours. BNP (last 3 results) No results for input(s): PROBNP in the last 8760 hours. HbA1C: No results for input(s): HGBA1C in the last 72 hours. CBG: No results for input(s): GLUCAP in the last 168 hours. Lipid Profile: No results for input(s): CHOL, HDL, LDLCALC, TRIG, CHOLHDL, LDLDIRECT in the last 72 hours. Thyroid Function Tests: No results for input(s): TSH, T4TOTAL, FREET4, T3FREE, THYROIDAB in the last 72 hours. Anemia Panel: No results for input(s): VITAMINB12, FOLATE, FERRITIN, TIBC, IRON, RETICCTPCT in the last 72 hours. Urine analysis:    Component Value Date/Time   COLORURINE YELLOW (A) 12/21/2020 1550   APPEARANCEUR HAZY (A) 12/21/2020 1550   LABSPEC 1.015 12/21/2020 1550   PHURINE 7.0 12/21/2020 1550   GLUCOSEU NEGATIVE 12/21/2020 1550   HGBUR SMALL (A) 12/21/2020 1550   BILIRUBINUR NEGATIVE 12/21/2020 1550   KETONESUR NEGATIVE 12/21/2020 1550   PROTEINUR 30 (A) 12/21/2020 1550   NITRITE POSITIVE (A) 12/21/2020 1550   LEUKOCYTESUR LARGE (A) 12/21/2020 1550   Sepsis Labs: @LABRCNTIP (procalcitonin:4,lacticidven:4) ) Recent Results (from the past 240 hour(s))  Resp Panel by RT-PCR (Flu A&B, Covid) Nasopharyngeal Swab     Status: None   Collection Time: 08/07/21  8:49 PM   Specimen: Nasopharyngeal Swab; Nasopharyngeal(NP) swabs in vial transport medium  Result Value Ref Range Status   SARS Coronavirus 2 by RT PCR NEGATIVE NEGATIVE Final    Comment: (NOTE) SARS-CoV-2 target nucleic acids are NOT DETECTED.  The SARS-CoV-2 RNA is generally detectable in upper respiratory specimens during the acute phase of infection. The lowest concentration of SARS-CoV-2  viral copies this assay can detect is 138 copies/mL. A negative result does not preclude SARS-Cov-2 infection and should not be used as the sole basis for treatment  or other patient management decisions. A negative result may occur with  improper specimen collection/handling, submission of specimen other than nasopharyngeal swab, presence of viral mutation(s) within the areas targeted by this assay, and inadequate number of viral copies(<138 copies/mL). A negative result must be combined with clinical observations, patient history, and epidemiological information. The expected result is Negative.  Fact Sheet for Patients:  EntrepreneurPulse.com.au  Fact Sheet for Healthcare Providers:  IncredibleEmployment.be  This test is no t yet approved or cleared by the [place] FDA and  has been authorized for detection and/or diagnosis of SARS-CoV-2 by FDA under an Emergency Use Authorization (EUA). This EUA will remain  in effect (meaning this test can be used) for the duration of the COVID-19 declaration under Section 564(b)(1) of the Act, 21 U.S.C.section 360bbb-3(b)(1), unless the authorization is terminated  or revoked sooner.       Influenza A by PCR NEGATIVE NEGATIVE Final   Influenza B by PCR NEGATIVE NEGATIVE Final    Comment: (NOTE) The Xpert Xpress SARS-CoV-2/FLU/RSV plus assay is intended as an aid in the diagnosis of influenza from Nasopharyngeal swab specimens and should not be used as a sole basis for treatment. Nasal washings and aspirates are unacceptable for Xpert Xpress SARS-CoV-2/FLU/RSV testing.  Fact Sheet for Patients: EntrepreneurPulse.com.au  Fact Sheet for Healthcare Providers: IncredibleEmployment.be  This test is not yet approved or cleared by the [place] FDA and has been authorized for detection and/or diagnosis of SARS-CoV-2 by FDA under an Emergency Use Authorization (EUA). This EUA will remain in effect (meaning this test can be used) for the duration of the COVID-19 declaration under Section 564(b)(1) of the Act, 21 U.S.C. section  360bbb-3(b)(1), unless the authorization is terminated or revoked.  Performed at [facility], [address] [place], [place] [address]      Radiological Exams on Admission: DG Chest 2 View  Result Date: 08/07/2021 CLINICAL DATA:  sob/ weakness/ weight gain EXAM: CHEST - 2 VIEW COMPARISON:  Chest x-ray 07/25/2021, CT chest 05/28/2018 FINDINGS: The heart and mediastinal contours are unchanged. Aortic calcification. No focal consolidation. Redemonstration of increased interstitial markings. No pulmonary edema. Slight increase in size of a trace to small volume bilateral pleural effusions. No pneumothorax. No acute osseous abnormality. IMPRESSION: 1. Mild pulmonary edema with slightly increased interval increase in size of bilateral trace to small volume pleural effusions. 2.  Aortic Atherosclerosis (ICD10-I70.0). Electronically Signed   By: Iven Finn M.D.   On: 08/07/2021 17:38    EKG: Independently reviewed.  Normal sinus rhythm, LVH by voltage criteria with nonspecific ST changes.  Assessment/Plan Principal Problem:   Acute on chronic combined systolic and diastolic CHF (congestive heart failure) (HCC) Active Problems:   Hyponatremia   Hyperlipidemia type II   GERD (gastroesophageal reflux disease)   Gastrostomy tube in place ([place])   Anemia of chronic disease   Essential hypertension   Controlled type 2 diabetes mellitus with hyperglycemia, without long-term current use of insulin (HCC)   Acute exacerbation of CHF (congestive heart failure) ([place])     #1 acute on chronic systolic and diastolic CHF: Patient is fluid overloaded.  We will initiate diuresis 20 mg twice a [name] and restrict patient from free water.  Continue other cardiac medications and monitor.  #2 hyponatremia: Sodium 127.  It was 134 just a  month ago.  Most likely the free water.  Restrict free water and monitor.  #3 GERD: Continue with PPIs  #4 gastrostomy tube in place: Continue tube feeding.  No  free water  #5 essential hypertension: Continue home regimen  #6 hyperlipidemia: Confirm on resume home regimen  #7 non-insulin-dependent diabetes: Continue with sliding scale insulin    DVT prophylaxis: Lovenox Code Status: DNR Family Communication: No family at bedside Disposition Plan: Home with home health Consults called: None Admission status: Inpatient  Severity of Illness: The appropriate patient status for this patient is INPATIENT. Inpatient status is judged to be reasonable and necessary in order to provide the required intensity of service to ensure the patient's safety. The patient's presenting symptoms, physical exam findings, and initial radiographic and laboratory data in the context of their chronic comorbidities is felt to place them at high risk for further clinical deterioration. Furthermore, it is not anticipated that the patient will be medically stable for discharge from the hospital within 2 midnights of admission.   * I certify that at the point of admission it is my clinical judgment that the patient will require inpatient hospital care spanning beyond 2 midnights from the point of admission due to high intensity of service, high risk for further deterioration and high frequency of surveillance required.Barbette Merino MD [facility] Pager [phone]  If 7PM-7AM, please contact night-coverage [email or web address] Password [facility]  08/07/2021, 9:59 PM

## 2021-08-08 DIAGNOSIS — E871 Hypo-osmolality and hyponatremia: Secondary | ICD-10-CM

## 2021-08-08 DIAGNOSIS — M79675 Pain in left toe(s): Secondary | ICD-10-CM

## 2021-08-08 DIAGNOSIS — E119 Type 2 diabetes mellitus without complications: Secondary | ICD-10-CM

## 2021-08-08 DIAGNOSIS — I10 Essential (primary) hypertension: Secondary | ICD-10-CM

## 2021-08-08 DIAGNOSIS — I5023 Acute on chronic systolic (congestive) heart failure: Secondary | ICD-10-CM

## 2021-08-08 DIAGNOSIS — G2581 Restless legs syndrome: Secondary | ICD-10-CM

## 2021-08-08 DIAGNOSIS — D638 Anemia in other chronic diseases classified elsewhere: Secondary | ICD-10-CM

## 2021-08-08 DIAGNOSIS — I5021 Acute systolic (congestive) heart failure: Secondary | ICD-10-CM

## 2021-08-08 LAB — URINALYSIS, COMPLETE (UACMP) WITH MICROSCOPIC
Bacteria, UA: NONE SEEN
Bilirubin Urine: NEGATIVE
Glucose, UA: NEGATIVE mg/dL
Hgb urine dipstick: NEGATIVE
Ketones, ur: NEGATIVE mg/dL
Nitrite: NEGATIVE
Protein, ur: NEGATIVE mg/dL
Specific Gravity, Urine: 1.005 (ref 1.005–1.030)
pH: 8 (ref 5.0–8.0)

## 2021-08-08 LAB — GLUCOSE, CAPILLARY
Glucose-Capillary: 208 mg/dL — ABNORMAL HIGH (ref 70–99)
Glucose-Capillary: 111 mg/dL — ABNORMAL HIGH (ref 70–99)

## 2021-08-08 LAB — CBG MONITORING, ED
Glucose-Capillary: 89 mg/dL (ref 70–99)
Glucose-Capillary: 87 mg/dL (ref 70–99)
Glucose-Capillary: 107 mg/dL — ABNORMAL HIGH (ref 70–99)

## 2021-08-08 LAB — BASIC METABOLIC PANEL
Anion gap: 9 (ref 5–15)
BUN: 45 mg/dL — ABNORMAL HIGH (ref 8–23)
CO2: 28 mmol/L (ref 22–32)
Calcium: 8.4 mg/dL — ABNORMAL LOW (ref 8.9–10.3)
Chloride: 94 mmol/L — ABNORMAL LOW (ref 98–111)
Creatinine, Ser: 0.96 mg/dL (ref 0.44–1.00)
GFR, Estimated: 56 mL/min — ABNORMAL LOW (ref >60)
Glucose, Bld: 87 mg/dL (ref 70–99)
Potassium: 4.2 mmol/L (ref 3.5–5.1)
Sodium: 131 mmol/L — ABNORMAL LOW (ref 135–145)

## 2021-08-08 LAB — TROPONIN I (HIGH SENSITIVITY): Troponin I (High Sensitivity): 28 ng/L — ABNORMAL HIGH (ref <18)

## 2021-08-08 LAB — URIC ACID: Uric Acid, Serum: 9.6 mg/dL — ABNORMAL HIGH (ref 2.5–7.1)

## 2021-08-08 MED ORDER — SODIUM CHLORIDE 0.9 % IV SOLN: 250.0000 mL | INTRAVENOUS | Status: DC | PRN

## 2021-08-08 MED ORDER — KATE FARMS STANDARD 1.4 PO LIQD
325.0000 mL | Freq: Three times a day (TID) | ORAL | Status: DC
Start: 2021-08-08 — End: 2021-08-08
  Filled 2021-08-08: qty 325

## 2021-08-08 MED ORDER — LOPERAMIDE HCL 1 MG/7.5ML PO SUSP
1.0000 mg | ORAL | Status: DC | PRN
  Filled 2021-08-08: qty 7.5

## 2021-08-08 MED ORDER — PANTOPRAZOLE 2 MG/ML SUSPENSION
40.0000 mg | Freq: Every day | ORAL | Status: DC
  Administered 2021-08-08 – 2021-08-09 (×2): 40 mg
  Filled 2021-08-08 (×2): qty 20

## 2021-08-08 MED ORDER — FREE WATER
120.0000 mL | Freq: Four times a day (QID) | Status: DC
  Administered 2021-08-08 – 2021-08-09 (×5): 120 mL
  Filled 2021-08-08 (×3): qty 120

## 2021-08-08 MED ORDER — KATE FARMS STANDARD 1.4 PO LIQD
325.0000 mL | Freq: Four times a day (QID) | ORAL | Status: DC
  Administered 2021-08-08 – 2021-08-09 (×5): 325 mL
  Filled 2021-08-08: qty 325

## 2021-08-08 MED ORDER — CLOPIDOGREL BISULFATE 75 MG PO TABS
75.0000 mg | ORAL_TABLET | Freq: Every day | ORAL | Status: DC
  Administered 2021-08-08 – 2021-08-09 (×2): 75 mg via JEJUNOSTOMY
  Filled 2021-08-08 (×2): qty 1

## 2021-08-08 MED ORDER — ALLOPURINOL 100 MG PO TABS
100.0000 mg | ORAL_TABLET | Freq: Every day | ORAL | Status: DC
  Administered 2021-08-08 – 2021-08-09 (×2): 100 mg
  Filled 2021-08-08 (×2): qty 1

## 2021-08-08 MED ORDER — SODIUM CHLORIDE 0.9% FLUSH: 3.0000 mL | INTRAVENOUS | Status: DC | PRN

## 2021-08-08 MED ORDER — ADULT MULTIVITAMIN LIQUID CH
15.0000 mL | Freq: Every day | ORAL | Status: DC
  Administered 2021-08-08 – 2021-08-09 (×2): 15 mL
  Filled 2021-08-08 (×2): qty 15

## 2021-08-08 MED ORDER — HYDROCERIN EX CREA
1.0000 "application " | TOPICAL_CREAM | Freq: Two times a day (BID) | CUTANEOUS | Status: DC
  Administered 2021-08-08 – 2021-08-09 (×2): 1 via TOPICAL
  Filled 2021-08-08: qty 113

## 2021-08-08 MED ORDER — FREE WATER: 100.0000 mL | Freq: Four times a day (QID) | Status: DC

## 2021-08-08 MED ORDER — MORPHINE SULFATE (PF) 2 MG/ML IV SOLN
0.5000 mg | Freq: Once | INTRAVENOUS | Status: AC
  Administered 2021-08-08: 0.5 mg via INTRAVENOUS
  Filled 2021-08-08: qty 1

## 2021-08-08 MED ORDER — ROPINIROLE HCL 1 MG PO TABS
2.0000 mg | ORAL_TABLET | Freq: Two times a day (BID) | ORAL | Status: DC
  Administered 2021-08-08 – 2021-08-09 (×3): 2 mg
  Filled 2021-08-08 (×4): qty 2

## 2021-08-08 MED ORDER — PREGABALIN 25 MG PO CAPS
25.0000 mg | ORAL_CAPSULE | Freq: Two times a day (BID) | ORAL | Status: DC
  Administered 2021-08-08 – 2021-08-09 (×2): 25 mg
  Filled 2021-08-08 (×2): qty 1

## 2021-08-08 MED ORDER — FUROSEMIDE 10 MG/ML IJ SOLN
20.0000 mg | Freq: Two times a day (BID) | INTRAMUSCULAR | Status: DC
  Administered 2021-08-08 (×2): 20 mg via INTRAVENOUS
  Filled 2021-08-08: qty 2
  Filled 2021-08-08: qty 4

## 2021-08-08 MED ORDER — LISINOPRIL 5 MG PO TABS
2.5000 mg | ORAL_TABLET | Freq: Every day | ORAL | Status: DC
  Administered 2021-08-08 – 2021-08-09 (×2): 2.5 mg via ORAL
  Filled 2021-08-08 (×2): qty 1

## 2021-08-08 MED ORDER — SODIUM CHLORIDE 0.9% FLUSH
3.0000 mL | Freq: Two times a day (BID) | INTRAVENOUS | Status: DC
  Administered 2021-08-08 – 2021-08-09 (×4): 3 mL via INTRAVENOUS

## 2021-08-08 MED ORDER — PREGABALIN 25 MG PO CAPS: 25.0000 mg | ORAL_CAPSULE | Freq: Every day | ORAL | Status: DC

## 2021-08-08 MED ORDER — CARVEDILOL 3.125 MG PO TABS
3.1250 mg | ORAL_TABLET | Freq: Two times a day (BID) | ORAL | Status: DC
  Administered 2021-08-08 – 2021-08-09 (×2): 3.125 mg
  Filled 2021-08-08 (×2): qty 1

## 2021-08-08 MED ORDER — ONDANSETRON HCL 4 MG/2ML IJ SOLN: 4.0000 mg | Freq: Four times a day (QID) | INTRAMUSCULAR | Status: DC | PRN

## 2021-08-08 MED ORDER — BROMFENAC SODIUM (ONCE-DAILY) 0.09 % OP SOLN: 1.0000 [drp] | Freq: Two times a day (BID) | OPHTHALMIC | Status: DC

## 2021-08-08 MED ORDER — FREE WATER
100.0000 mL | Freq: Three times a day (TID) | Status: DC
  Filled 2021-08-08 (×2): qty 100

## 2021-08-08 MED ORDER — ROPINIROLE HCL 1 MG PO TABS
2.0000 mg | ORAL_TABLET | Freq: Once | ORAL | Status: AC
  Administered 2021-08-08: 2 mg
  Filled 2021-08-08: qty 2

## 2021-08-08 MED ORDER — POLYETHYLENE GLYCOL 3350 17 G PO PACK: 17.0000 g | PACK | Freq: Every day | ORAL | Status: DC | PRN

## 2021-08-08 MED ORDER — INSULIN ASPART 100 UNIT/ML IJ SOLN
0.0000 [IU] | Freq: Three times a day (TID) | INTRAMUSCULAR | Status: DC
  Administered 2021-08-08 – 2021-08-09 (×2): 5 [IU] via SUBCUTANEOUS
  Filled 2021-08-08 (×2): qty 1

## 2021-08-08 MED ORDER — POLYVINYL ALCOHOL 1.4 % OP SOLN
1.0000 [drp] | Freq: Three times a day (TID) | OPHTHALMIC | Status: DC | PRN
  Filled 2021-08-08: qty 15

## 2021-08-08 MED ORDER — ACETAMINOPHEN 325 MG PO TABS: 650.0000 mg | ORAL_TABLET | ORAL | Status: DC | PRN

## 2021-08-08 MED ORDER — ENOXAPARIN SODIUM 30 MG/0.3ML IJ SOSY
30.0000 mg | PREFILLED_SYRINGE | INTRAMUSCULAR | Status: DC
  Administered 2021-08-08 – 2021-08-09 (×2): 30 mg via SUBCUTANEOUS
  Filled 2021-08-08 (×2): qty 0.3

## 2021-08-08 MED ORDER — INSULIN ASPART 100 UNIT/ML IJ SOLN: 0.0000 [IU] | Freq: Every day | INTRAMUSCULAR | Status: DC

## 2021-08-08 MED ORDER — ASPIRIN 81 MG PO CHEW
81.0000 mg | CHEWABLE_TABLET | Freq: Every day | ORAL | Status: DC
  Administered 2021-08-08 – 2021-08-09 (×2): 81 mg
  Filled 2021-08-08 (×2): qty 1

## 2021-08-08 NOTE — Progress Notes (Signed)
Initial Nutrition Assessment  DOCUMENTATION CODES:   Not applicable  INTERVENTION:   [employer] [place] Standard 1.4 formula- Give 4 cartons daily via tube- Flush with 72ml of water before and after each feeding.   Regimen provides 1820kcal/day, 80g/day protein and 1451ml/day free water.   NUTRITION DIAGNOSIS:   Inadequate oral intake related to dysphagia as evidenced by  (pt with chronic G-tube).  GOAL:   Patient will meet greater than or equal to 90% of their needs  MONITOR:   Labs, Weight trends, Skin, I & O's  REASON FOR ASSESSMENT:   Consult Enteral/tube feeding initiation and management  ASSESSMENT:   87 y.o. female with medical history significant of HTN, HLD, DM, stroke, GERD, esophageal stricture with chronic G-tube, iron deficiency anemia, pulm HTN, occlusive mesenteric ischemia, CHF with EF 35-40%, aortic stenosis and CKD-IIIa who presents with shortness breath.  Pt remains in the ED. Pt is well known to nutrition department and this RD from multiple previous admits. Pt last seen by RD on 10/26. Pt's home tube feed regimen is D[employer] [place] Standard 1.4 formula. Pt is supposed to receive 4 cartons daily but often only takes 3 cartons at home r/t early satiety. Pt was tolerating 4 cartons daily during her last admission. RD will resume pt's home tube feed regimen with minimal free water flushes in setting of hyponatremia. Pt does eat small amounts by mouth for pleasure. Per chart, pt appears fairly weight stable at baseline. Pt previously diagnosed with moderate malnutrition; RD unable to diagnose at this time as NFPE cannot be performed. RD will obtain nutrition related exam and history at follow up.   Medications reviewed and include: allopurinol, aspirin, plavix, lovenox, lasix, insulin, MVI  Labs reviewed: Na 131(L), K 4.2 wnl, BUN 45(H), uric acid 9.6(H) BNP- 1311- 11/7 Hgb 8.8(L), Hct 27.4(L)  NUTRITION - FOCUSED PHYSICAL EXAM: Unable to perform at this time   Diet  Order:   Diet Order             Diet NPO time specified  Diet effective now                  EDUCATION NEEDS:   No education needs have been identified at this time  Skin:  Skin Assessment: Reviewed RN Assessment  Last BM:  pta  Height:   Ht Readings from Last 1 Encounters:  08/07/21 5\' 5"  (1.651 m)    Weight:   Wt Readings from Last 1 Encounters:  08/07/21 51.7 kg    Ideal Body Weight:  56.8 kg  BMI:  Body mass index is 18.97 kg/m.  Estimated Nutritional Needs:   Kcal:  1500-1700kcal/day  Protein:  75-85g/day  Fluid:  1.2-1.5L/day  Koleen Distance MS, RD, LDN Please refer to [facility] for RD and/or RD on-call/weekend/after hours pager

## 2021-08-08 NOTE — ED Notes (Signed)
Pt changed into hospital bed for comfort.

## 2021-08-08 NOTE — ED Notes (Signed)
Secure msg sent to Dr. Hal Hope re: pain meds, tube feeds, meds via tube.

## 2021-08-08 NOTE — ED Notes (Signed)
Secure msg sent to Dr. Jonelle Sidle re: pt takes tube feeds at home & also orders to give meds via tube.

## 2021-08-08 NOTE — Progress Notes (Signed)
Anticoagulation monitoring(Lovenox):  87 yo female ordered Lovenox 40 mg Q24h    Filed Weights   08/07/21 1641  Weight: 51.7 kg (114 lb)   BMI 28.5 ml/min    Lab Results  Component Value Date   CREATININE 1.07 (H) 08/07/2021   CREATININE 0.87 07/28/2021   CREATININE 1.00 07/27/2021   Estimated Creatinine Clearance: 28.5 mL/min (A) (by C-G formula based on SCr of 1.07 mg/dL (H)). Hemoglobin & Hematocrit     Component Value Date/Time   HGB 8.8 (L) 08/07/2021 1645   HGB 12.3 12/23/2014 1008   HCT 27.4 (L) 08/07/2021 1645     Per Protocol for Patient with estCrcl < 30 ml/min and BMI < 40, will transition to Lovenox 30 mg Q24h.

## 2021-08-08 NOTE — Evaluation (Signed)
Occupational Therapy Evaluation Patient Details Name: [name] Day MRN: [id number] DOB: 05-09-1929 Today's Date: 08/08/2021   History of Present Illness 87 y.o. female with medical history significant of diastolic dysfunction CHF, diabetes, basal cell carcinoma, PEG tube dependent, history of esophageal cancer, history of esophageal stricture, GERD, pulmonary hypertension, kidney stones, essential hypertension and hyperlipidemia who presents with shortness of breath from home.   Clinical Impression   Pt seen for OT evaluation this date. Upon arrival to room, pt awake and seated upright in bed on RA with daughter present. Prior to admission, pt was mod-independent with rollator for functional mobility and ADLs, living in a 1-level home with 24/7 supervision/assistance from adult children. Pt's children assist with IADLs (e.g., preparing tube feeds d/t decreased fine motor skill and cleaning house). Pt currently requires MIN GUARD for sit>stand LB dressing, SUPERVISION for functional mobility of short household distances with RW, and SUPERVISION for standing grooming tasks d/t decreased activity tolerance. Pt appears to be nearing baseline independence with ADLs and functional mobility, however presents with decreased activity tolerance compared to baseline. Telemetry pulse ox with questionable reading during session; indicating SpO2 mid 80s after sit>stand LB dressing, however OT's portable pulse ox simultaneously reporting SpO2 94%. At end of session, pt left in bed, with all needs within reach and in no acute distress (SpO2 >92% while on RA). Pt would benefit from additional skilled OT services to maximize return to PLOF and minimize risk of future falls, injury, caregiver burden, and readmission. Upon discharge, recommend no OT follow up.          Recommendations for follow up therapy are one component of a multi-disciplinary discharge planning process, led by the attending physician.   Recommendations may be updated based on patient status, additional functional criteria and insurance authorization.   Follow Up Recommendations  No OT follow up    Assistance Recommended at Discharge Frequent or constant Supervision/Assistance  Functional Status Assessment  Patient has had a recent decline in their functional status and demonstrates the ability to make significant improvements in function in a reasonable and predictable amount of time.  Equipment Recommendations  None recommended by OT       Precautions / Restrictions Precautions Precautions: Fall Restrictions Weight Bearing Restrictions: No      Mobility Bed Mobility Overal bed mobility: Modified Independent             General bed mobility comments: Pt did not need any direct assist getting to EOB, light use of rails and minimal cuing    Transfers Overall transfer level: Needs assistance Equipment used: Rolling walker (2 wheels) Transfers: Sit to/from Stand Sit to Stand: Supervision           General transfer comment: Supervision d/t pt reporting dizziness with change in position at baseline      Balance Overall balance assessment: Needs assistance Sitting-balance support: No upper extremity supported;Feet supported Sitting balance-Leahy Scale: Good Sitting balance - Comments: Good sitting balance reaching outside BOS to don/doff shoes   Standing balance support: Single extremity supported;During functional activity Standing balance-Leahy Scale: Fair Standing balance comment: MIN GUARD for sit>stand LB dressing, requiring single UE support from RW                           ADL either performed or assessed with clinical judgement   ADL Overall ADL's : Needs assistance/impaired     Grooming: Wash/dry hands;Supervision/safety;Standing  Lower Body Dressing: Min guard Lower Body Dressing Details (indicate cue type and reason): MIN GUARD to don/doff  underwearsit>stand and don/doff shoes while seated             Functional mobility during ADLs: Supervision/safety;Rolling walker (2 wheels)       Vision Baseline Vision/History: 1 Wears glasses              Pertinent Vitals/Pain Pain Assessment: No/denies pain        Extremity/Trunk Assessment Upper Extremity Assessment Upper Extremity Assessment: Generalized weakness   Lower Extremity Assessment Lower Extremity Assessment: Generalized weakness       Communication Communication Communication: Expressive difficulties (baseline)   Cognition Arousal/Alertness: Awake/alert Behavior During Therapy: WFL for tasks assessed/performed Overall Cognitive Status: Within Functional Limits for tasks assessed                                       General Comments  Pt on RA throughout session. Telemetry pulse ox with questionable readings; indicating mid 80s after sit>stand LB dressing, however OT's portable pulse ox simultaneously reporting SpO2 94%.            Home Living Family/patient expects to be discharged to:: Private residence Living Arrangements: Children Available Help at Discharge: Family;Available 24 hours/day (pt lives in her own home and daughters split 24/7 supervision) Type of Home: House Home Access: Ramped entrance     Home Layout: One level     Bathroom Shower/Tub: Tub/shower unit;Sponge bathes at [place]: Handicapped height Bathroom Accessibility: Yes   Home Equipment: Conservation officer, nature (2 wheels);Rollator (4 wheels);Toilet riser;Cane - single point   Additional Comments: Pt had been living at home alone with only occasional check-ins until earlier this year s/p CVA. Now receives 24/7 supervision      Prior Functioning/Environment Prior Level of Function : Needs assist             Mobility Comments: Pt is MOD-I with rollator for functional mobility of household distances. Requires supervision for shower  transfers. Denies falls within past 6 months ADLs Comments: Pt is independent with ADLs. Family assists with IADLs        OT Problem List: Decreased activity tolerance;Impaired balance (sitting and/or standing)      OT Treatment/Interventions: Self-care/ADL training;Therapeutic exercise;Energy conservation;DME and/or AE instruction;Therapeutic activities;Patient/family education;Balance training    OT Goals(Current goals can be found in the care plan section) Acute Rehab OT Goals Patient Stated Goal: to breathe better OT Goal Formulation: With patient Time For Goal Achievement: 08/22/21 Potential to Achieve Goals: Good ADL Goals Pt Will Perform Lower Body Bathing: with supervision;sit to/from stand Pt Will Perform Lower Body Dressing: with modified independence;sit to/from stand Pt Will Transfer to Toilet: with modified independence;ambulating;bedside commode  OT Frequency: Min 2X/week    AM-PAC OT "6 Clicks" Daily Activity     Outcome Measure Help from another person eating meals?: None Help from another person taking care of personal grooming?: A Little Help from another person toileting, which includes using toliet, bedpan, or urinal?: A Little Help from another person bathing (including washing, rinsing, drying)?: A Little Help from another person to put on and taking off regular upper body clothing?: None Help from another person to put on and taking off regular lower body clothing?: A Little 6 Click Score: 20   End of Session Equipment Utilized During Treatment: Rolling walker (2 wheels) Nurse  Communication: Mobility status  Activity Tolerance: Patient tolerated treatment well Patient left: in bed;with call bell/phone within reach;with bed alarm set  OT Visit Diagnosis: Muscle weakness (generalized) (M62.81);Unsteadiness on feet (R26.81)                Time: 2751-7001 OT Time Calculation (min): 27 min Charges:  OT General Charges $OT Visit: 1 Visit OT Evaluation $OT  Eval Moderate Complexity: 1 Mod OT Treatments $Self Care/Home Management : 8-22 mins  Fredirick Maudlin, OTR/L [place]

## 2021-08-08 NOTE — Progress Notes (Signed)
Patient ID: [name] Day, female   DOB: [birth year], 87 y.o.   MRN: [id number] [facility] PROGRESS NOTE  ANGLE DIRUSSO Day EHU:[id number] DOB: 11/25/1928 DOA: 08/07/2021 PCP: [name], MD  HPI/Subjective: Patient coming in with shortness of breath.  History of congestive heart failure.  Patient complaining of pain of her toes on her left foot.  He does have a history of gout and recently started on Lyrica.  Patient states that her breathing is a little bit better today.  Objective: Vitals:   08/08/21 1000 08/08/21 1100  BP: (!) 118/50 (!) 135/57  Pulse: 64 76  Resp: 19 16  Temp:    SpO2: 94% 99%    Intake/Output Summary (Last 24 hours) at 08/08/2021 1212 Last data filed at 08/08/2021 0332 Gross per 24 hour  Intake --  Output 700 ml  Net -700 ml   Filed Weights   08/07/21 1641  Weight: 51.7 kg    ROS: Review of Systems  Respiratory:  Positive for shortness of breath.   Cardiovascular:  Negative for chest pain.  Gastrointestinal:  Negative for abdominal pain, nausea and vomiting.  Musculoskeletal:  Positive for joint pain.  Exam: Physical Exam HENT:     Head: Normocephalic.     Mouth/Throat:     Pharynx: No oropharyngeal exudate.  Eyes:     General: Lids are normal.     Conjunctiva/sclera: Conjunctivae normal.  Cardiovascular:     Rate and Rhythm: Normal rate and regular rhythm.     Heart sounds: S1 normal and S2 normal. Murmur heard.  Systolic murmur is present with a grade of 2/6.  Pulmonary:     Breath sounds: Examination of the right-lower field reveals decreased breath sounds. Examination of the left-lower field reveals decreased breath sounds. Decreased breath sounds present. No wheezing, rhonchi or rales.  Abdominal:     Palpations: Abdomen is soft.     Tenderness: There is no abdominal tenderness.  Musculoskeletal:     Right ankle: No swelling.     Left ankle: No swelling.  Skin:    General: Skin is warm.     Findings: No rash.   Neurological:     Mental Status: She is alert and oriented to person, place, and time.      Scheduled Meds:  allopurinol  100 mg Per Tube Daily   aspirin  81 mg Per Tube Daily   Bromfenac Sodium  1 drop Left Eye BID   carvedilol  3.125 mg Per Tube BID WC   clopidogrel  75 mg Per J Tube Daily   enoxaparin (LOVENOX) injection  30 mg Subcutaneous Q24H   feeding supplement (KATE FARMS STANDARD 1.4)  325 mL Per Tube TID BM   free water  100 mL Per Tube TID   furosemide  20 mg Intravenous BID   hydrocerin  1 application Topical BID   insulin aspart  0-15 Units Subcutaneous TID WC   insulin aspart  0-5 Units Subcutaneous QHS   lisinopril  2.5 mg Oral Daily   multivitamin  15 mL Per Tube Daily   pantoprazole sodium  40 mg Per Tube Daily   pregabalin  25 mg Per Tube BID   rOPINIRole  2 mg Per Tube BID   sodium chloride flush  3 mL Intravenous Q12H   Continuous Infusions:  sodium chloride      Assessment/Plan:  Acute on chronic systolic congestive heart failure.  Moderate to severe mitral regurgitation patient is on Lasix 20 mg  IV twice a day.  Continue Coreg and lisinopril.  Reevaluate tomorrow. Hyponatremia.  Sodium up to 131.  We will have dietitian calculate the amount of free water via the NG tube. Toe pain left side first 3 toes.  We will add on uric acid.  Restart Lyrica 25 mg twice a day. Essential hypertension on Coreg and lisinopril Type 2 diabetes mellitus on sliding scale insulin holding glipizide. Restless leg syndrome on Requip Anemia of chronic disease.  Code Status:     Code Status Orders  (From admission, onward)           Start     Ordered   08/08/21 0154  Do not attempt resuscitation (DNR)  Continuous       Question Answer Comment  In the event of cardiac or respiratory ARREST Do not call a "code blue"   In the event of cardiac or respiratory ARREST Do not perform Intubation, CPR, defibrillation or ACLS   In the event of cardiac or respiratory ARREST  Use medication by any route, position, wound care, and other measures to relive pain and suffering. May use oxygen, suction and manual treatment of airway obstruction as needed for comfort.   Comments MOST form on chart.      08/08/21 0153           Code Status History     Date Active Date Inactive Code Status Order ID Comments User Context   07/26/2021 0547 07/28/2021 1616 DNR [id number]  Athena Masse, MD ED   02/16/2021 1445 02/17/2021 1644 DNR [id number]  Asencion Gowda, NP Inpatient   02/15/2021 1847 02/16/2021 1445 Full Code [id number]  Ivor Costa, MD ED   12/22/2020 1320 01/01/2021 1511 Full Code [id number]  Bary Leriche, PA-C Inpatient   12/17/2020 1929 12/22/2020 1300 Full Code [id number]  Rust-Chester, Huel Cote, NP ED   11/23/2019 1331 11/26/2019 1713 Full Code [id number]  Para Skeans, MD ED   06/23/2018 1723 06/25/2018 1835 Full Code [id number]  Nicholes Mango, MD Inpatient   06/11/2018 1559 06/13/2018 1803 Full Code [id number]  Vaughan Basta, MD Inpatient   03/18/2017 1033 03/18/2017 1544 Full Code [id number]  Algernon Huxley, MD Inpatient      Advance Directive Documentation    Flowsheet Row Most Recent Value  Type of Advance Directive Healthcare Power of Attorney, Living will  Pre-existing out of facility DNR order (yellow form or pink MOST form) --  "MOST" Form in Place? --      Family Communication: Family at bedside Disposition Plan: Status is: Inpatient.  Reevaluate tomorrow for potential disposition  Time spent: 28 minutes  [place]

## 2021-08-08 NOTE — ED Notes (Signed)
MD made aware of need of daily meds for pt

## 2021-08-08 NOTE — ED Notes (Signed)
Dietary called to verify tube feed orders and send supplies

## 2021-08-08 NOTE — Evaluation (Signed)
Physical Therapy Evaluation Patient Details Name: [name] Day MRN: [id number] DOB: 31-May-1929 Today's Date: 08/08/2021  History of Present Illness  87 y.o. female with medical history significant of diastolic dysfunction CHF, diabetes, basal cell carcinoma, PEG tube dependent, history of esophageal cancer, history of esophageal stricture, GERD, pulmonary hypertension, kidney stones, essential hypertension and hyperlipidemia who presents with shortness of breath from home.  Clinical Impression  Pt was able to perform bed mobility, transfers and >100 ft of ambulation all w/o direct assist.  She showed ability to maintain balance and consistent cadence with good effort.  She reports feeling a little weak but generally not too far from her baseline.  Pt's daughter present t/o the session and reports they have the set up and equipment they need at home, feels good about being able to continue with current set up of family providing 24/7 assist and keeping pt active.     Recommendations for follow up therapy are one component of a multi-disciplinary discharge planning process, led by the attending physician.  Recommendations may be updated based on patient status, additional functional criteria and insurance authorization.  Follow Up Recommendations Home health PT (pt/daughter report that they just recently finished up a round of HHPT, may feel confident just continuing with the exercises provided by them.)    Assistance Recommended at Discharge Frequent or constant Supervision/Assistance  Functional Status Assessment Patient has had a recent decline in their functional status and demonstrates the ability to make significant improvements in function in a reasonable and predictable amount of time.  Equipment Recommendations  None recommended by PT    Recommendations for Other Services       Precautions / Restrictions Precautions Precautions: Fall Restrictions Weight Bearing Restrictions: No       Mobility  Bed Mobility Overal bed mobility: Modified Independent             General bed mobility comments: Pt did not need any direct assist getting to EOB, light use of rails and minimal cuing    Transfers Overall transfer level: Modified independent Equipment used: Rolling walker (2 wheels)               General transfer comment: Pt was able to position herself to rise and then attain standing w/o direct assist.  Minimal cuing with no safety issues    Ambulation/Gait Ambulation/Gait assistance: Supervision Gait Distance (Feet): 150 Feet Assistive device: Rolling walker (2 wheels)         General Gait Details: Pt was able to perform prolonged bout of ambulation with relative ease.  She had mild fatigue but O2 remained in the mid 90s on room air, HR stable (<100) t/o.  Stairs            Wheelchair Mobility    Modified Rankin (Stroke Patients Only)       Balance Overall balance assessment: Modified Independent                                           Pertinent Vitals/Pain Pain Assessment: No/denies pain    Home Living Family/patient expects to be discharged to:: Private residence Living Arrangements: Children Available Help at Discharge: Family;Available 24 hours/day (pt lives in her own home and daughters split 24/7 supervision) Type of Home: House Home Access: Ramped entrance       Home Layout: One level Home Equipment: Conservation officer, nature (2  wheels);Rollator (4 wheels);Toilet riser;Cane - single point      Prior Function Prior Level of Function : Needs assist             Mobility Comments: Pt had been living at home alone with only occasional check-ins until earlier this year s/p CVA       Hand Dominance        Extremity/Trunk Assessment   Upper Extremity Assessment Upper Extremity Assessment: Generalized weakness    Lower Extremity Assessment Lower Extremity Assessment: Generalized weakness        Communication   Communication: Expressive difficulties (baseline)  Cognition Arousal/Alertness: Awake/alert Behavior During Therapy: WFL for tasks assessed/performed Overall Cognitive Status: Within Functional Limits for tasks assessed                                          General Comments General comments (skin integrity, edema, etc.): Pt showed good effort and reports she is not too far from her baseline.    Exercises     Assessment/Plan    PT Assessment Patient needs continued PT services  PT Problem List Decreased strength;Decreased activity tolerance;Decreased mobility;Decreased safety awareness;Cardiopulmonary status limiting activity;Decreased balance       PT Treatment Interventions DME instruction;Gait training;Functional mobility training;Therapeutic activities;Therapeutic exercise;Balance training;Neuromuscular re-education;Patient/family education    PT Goals (Current goals can be found in the Care Plan section)  Acute Rehab PT Goals Patient Stated Goal: go home PT Goal Formulation: With patient Time For Goal Achievement: 08/22/21 Potential to Achieve Goals: Good    Frequency Min 2X/week   Barriers to discharge        Co-evaluation               AM-PAC PT "6 Clicks" Mobility  Outcome Measure Help needed turning from your back to your side while in a flat bed without using bedrails?: None Help needed moving from lying on your back to sitting on the side of a flat bed without using bedrails?: A Little Help needed moving to and from a bed to a chair (including a wheelchair)?: None Help needed standing up from a chair using your arms (e.g., wheelchair or bedside chair)?: None Help needed to walk in hospital room?: None Help needed climbing 3-5 steps with a railing? : A Little 6 Click Score: 22    End of Session Equipment Utilized During Treatment: Gait belt Activity Tolerance: Patient tolerated treatment well;Patient limited by  fatigue Patient left: in bed;with call bell/phone within reach Nurse Communication: Mobility status PT Visit Diagnosis: Muscle weakness (generalized) (M62.81);Difficulty in walking, not elsewhere classified (R26.2)    Time: 9826-4158 PT Time Calculation (min) (ACUTE ONLY): 22 min   Charges:   PT Evaluation $PT Eval Low Complexity: 1 Low PT Treatments $Gait Training: 8-22 mins        Kreg Shropshire, DPT 08/08/2021, 11:09 AM

## 2021-08-09 DIAGNOSIS — D500 Iron deficiency anemia secondary to blood loss (chronic): Secondary | ICD-10-CM

## 2021-08-09 DIAGNOSIS — I5023 Acute on chronic systolic (congestive) heart failure: Secondary | ICD-10-CM

## 2021-08-09 LAB — BASIC METABOLIC PANEL
Anion gap: 7 (ref 5–15)
BUN: 49 mg/dL — ABNORMAL HIGH (ref 8–23)
CO2: 32 mmol/L (ref 22–32)
Calcium: 8.4 mg/dL — ABNORMAL LOW (ref 8.9–10.3)
Chloride: 96 mmol/L — ABNORMAL LOW (ref 98–111)
Creatinine, Ser: 1.19 mg/dL — ABNORMAL HIGH (ref 0.44–1.00)
GFR, Estimated: 43 mL/min — ABNORMAL LOW (ref >60)
Glucose, Bld: 114 mg/dL — ABNORMAL HIGH (ref 70–99)
Potassium: 4.6 mmol/L (ref 3.5–5.1)
Sodium: 135 mmol/L (ref 135–145)

## 2021-08-09 LAB — CBC
HCT: 25.5 % — ABNORMAL LOW (ref 36.0–46.0)
Hemoglobin: 7.9 g/dL — ABNORMAL LOW (ref 12.0–15.0)
MCH: 29.4 pg (ref 26.0–34.0)
MCHC: 31 g/dL (ref 30.0–36.0)
MCV: 94.8 fL (ref 80.0–100.0)
Platelets: 352 10*3/uL (ref 150–400)
RBC: 2.69 MIL/uL — ABNORMAL LOW (ref 3.87–5.11)
RDW: 15.4 % (ref 11.5–15.5)
WBC: 6.8 10*3/uL (ref 4.0–10.5)
nRBC: 0 % (ref 0.0–0.2)

## 2021-08-09 LAB — GLUCOSE, CAPILLARY
Glucose-Capillary: 110 mg/dL — ABNORMAL HIGH (ref 70–99)
Glucose-Capillary: 212 mg/dL — ABNORMAL HIGH (ref 70–99)

## 2021-08-09 MED ORDER — COLCHICINE 0.6 MG PO TABS
0.6000 mg | ORAL_TABLET | Freq: Once | ORAL | Status: AC
  Administered 2021-08-09: 0.6 mg via ORAL
  Filled 2021-08-09: qty 1

## 2021-08-09 MED ORDER — FREE WATER: 120.0000 mL | Freq: Four times a day (QID) | Status: AC

## 2021-08-09 MED ORDER — PREGABALIN 25 MG PO CAPS: 25.0000 mg | ORAL_CAPSULE | Freq: Two times a day (BID) | ORAL | Status: DC

## 2021-08-09 MED ORDER — FUROSEMIDE 20 MG PO TABS
20.0000 mg | ORAL_TABLET | Freq: Two times a day (BID) | ORAL | Status: DC
Start: 2021-08-09 — End: 2021-11-19

## 2021-08-09 MED ORDER — SODIUM CHLORIDE 0.9 % IV SOLN
400.0000 mg | Freq: Once | INTRAVENOUS | Status: AC
  Administered 2021-08-09: 400 mg via INTRAVENOUS
  Filled 2021-08-09: qty 20

## 2021-08-09 MED ORDER — COLCHICINE 0.6 MG PO TABS: ORAL_TABLET | ORAL | 0 refills | Status: DC

## 2021-08-09 MED ORDER — FUROSEMIDE 20 MG PO TABS
20.0000 mg | ORAL_TABLET | Freq: Two times a day (BID) | ORAL | Status: DC
  Administered 2021-08-09: 20 mg
  Filled 2021-08-09: qty 1

## 2021-08-09 NOTE — Plan of Care (Signed)

## 2021-08-09 NOTE — Discharge Summary (Addendum)
[name] [name] NAME: [name]    MR#:  [id number]  DATE OF BIRTH:  [birth year]  DATE OF ADMISSION:  08/07/2021 ADMITTING PHYSICIAN: Elwyn Reach, MD  DATE OF DISCHARGE: 08/09/2021  3:44 PM  PRIMARY CARE PHYSICIAN: [name], MD    ADMISSION DIAGNOSIS:  Shortness of breath [R06.02] Acute exacerbation of CHF (congestive heart failure) (HCC) [I50.9] Acute on chronic congestive heart failure, unspecified heart failure type ([place]) [I50.9]  DISCHARGE DIAGNOSIS:  Principal Problem:   Acute on chronic combined systolic and diastolic CHF (congestive heart failure) (HCC) Active Problems:   Hyponatremia   Hyperlipidemia type II   GERD (gastroesophageal reflux disease)   Type 2 diabetes mellitus without complication, without long-term current use of insulin (HCC)   Gastrostomy tube in place ([name])   Anemia of chronic disease   Essential hypertension   Controlled type 2 diabetes mellitus with hyperglycemia, without long-term current use of insulin (HCC)   Acute exacerbation of CHF (congestive heart failure) ([place])   SECONDARY DIAGNOSIS:   Past Medical History:  Diagnosis Date  . Anemia    IRON INFUSIONS  . Aortic valve sclerosis   . Arrhythmia   . Arthritis    osteoarthritis  . Basal cell carcinoma of skin   . Brain tumor ([name])   . Brain tumor ([name])   . Cervical spine disease   . CHF (congestive heart failure) ([name])   . Diabetes mellitus without complication ([name])   . Dysrhythmia    sinus arrhythmia  . Esophageal stricture    severe, led to feeding tube placement in Sept 2019  . Esophageal ulcer without bleeding   . Gastrostomy tube dependent ([place])    DOES NOT EAT OR DRINK   . GERD (gastroesophageal reflux disease)   . Hearing loss    pt had hearing test  last month, left ear85% hearing due to stroke, 78% in right-normal for her age  . History of kidney stones   . Hyperlipemia   . Hypertension   . Kidney  stones   . Leaky heart valve   . Meningioma ([name])   . 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 Pulmonary hypertension ([name])   . RLS (restless legs syndrome)   . Stroke ([name])   . Vertigo     HOSPITAL COURSE:   Acute on chronic systolic congestive heart failure.  Moderate to severe mitral regurgitation.  EF 40 to 45%.  The patient was diuresed with Lasix 20 mg IV twice a [name].  She states her breathing is back to normal.  We will switch back over to 20 mg orally twice a [name].  Continue Coreg and lisinopril.  Follow-up at the CHF clinic Hyponatremia.  Sodium 127 on presentation and now in the normal range upon getting out of the hospital at 135.  I had dietitian recalculate the amount of free water. Toe pain left side first 3 toes.  This is better today after restarting Lyrica 25 mg twice a [name].  With uric acid being high I did prescribe colchicine 0.3 mg for a few more days upon going home and then as needed for gout attacks after that.  Continue allopurinol. Essential hypertension on Coreg and lisinopril Type 2 diabetes mellitus on sliding scale insulin while in the hospital.  Can go back on Glucophage as outpatient but would hold glipizide. Restless leg syndrome on Requip Iron deficiency anemia.  Hemoglobin 7.9 upon disposition.  Patient cannot have a GI  work-up secondary to stenosis in the esophagus.  Likely chronically losing blood.  She does get iron infusions as outpatient.  I did give 400 mg of IV Venofer prior to discharge.  Follow-up with hematology as outpatient for close follow-up of counts. Nutrition.  Patient only able to do 3 cans of tube feedings per [name].  Continue free water 120 mL 4 times a [name]. Chronic kidney disease stage III.  Initially was 3A but after diuresis turned out being 3B.  DISCHARGE CONDITIONS:   Fair  CONSULTS OBTAINED:  None  DRUG ALLERGIES:   Allergies  Allergen Reactions  . Elemental Sulfur Diarrhea and Nausea And Vomiting  . Gabapentin Swelling  .  Lipitor [Atorvastatin] Other (See Comments)    Muscle aches  . Mevacor [Lovastatin] Other (See Comments)    Muscle aches  . Milk-Related Compounds Other (See Comments)    Large quantities cause headaches   . Septra [Sulfamethoxazole-Trimethoprim] Other (See Comments)    Unknown  . Statins Other (See Comments)    Muscle pain  . Zocor [Simvastatin] Other (See Comments)    Muscle aches    DISCHARGE MEDICATIONS:   Allergies as of 08/09/2021       Reactions   Elemental Sulfur Diarrhea, Nausea And Vomiting   Gabapentin Swelling   Lipitor [atorvastatin] Other (See Comments)   Muscle aches   Mevacor [lovastatin] Other (See Comments)   Muscle aches   Milk-related Compounds Other (See Comments)   Large quantities cause headaches    Septra [sulfamethoxazole-trimethoprim] Other (See Comments)   Unknown   Statins Other (See Comments)   Muscle pain   Zocor [simvastatin] Other (See Comments)   Muscle aches        Medication List     STOP taking these medications    glipiZIDE 2.5 MG 24 hr tablet Commonly known as: GLUCOTROL XL       TAKE these medications    acetaminophen 160 MG/5ML solution Commonly known as: TYLENOL Place 20.3 mLs (650 mg total) into feeding tube every 6 (six) hours as needed for mild pain.   allopurinol 100 MG tablet Commonly known as: ZYLOPRIM Take by mouth.   aspirin 81 MG chewable tablet Place 1 tablet (81 mg total) into feeding tube daily.   Bromfenac Sodium 0.09 % Soln Place 1 drop into the left eye 2 (two) times daily.   carvedilol 3.125 MG tablet Commonly known as: COREG Place 1 tablet (3.125 mg total) into feeding tube 2 (two) times daily with a meal.   clopidogrel 75 MG tablet Commonly known as: PLAVIX GIVE 1 TABLET VIA FEEDING TUBE EVERY [name] AS DIRECTED   colchicine 0.6 MG tablet Daily for three days the as needed for gout attack   feeding supplement (KATE FARMS STANDARD 1.4) Liqd liquid Place 325 mLs into feeding tube 4 (four)  times daily. What changed: when to take this   free water Soln Place 120 mLs into feeding tube 4 (four) times daily. What changed:  how much to take when to take this   furosemide 20 MG tablet Commonly known as: LASIX Place 1 tablet (20 mg total) into feeding tube 2 (two) times daily.   hydrocerin Crea Apply 1 application topically 2 (two) times daily. To bilateral feet   Hypromellose 0.2 % Soln Place 1 drop into both eyes 3 (three) times daily as needed (dry eyes).   lisinopril 2.5 MG tablet Commonly known as: ZESTRIL Take 1 tablet by mouth daily.   loperamide 1 MG/5ML solution Commonly  known as: IMODIUM Take 1 mg by mouth as needed for diarrhea or loose stools.   metFORMIN 500 MG tablet Commonly known as: GLUCOPHAGE Place 1 tablet (500 mg total) into feeding tube 2 (two) times daily with a meal.   multivitamin Liqd Place 15 mLs into feeding tube daily.   pantoprazole sodium 40 mg Commonly known as: PROTONIX Place 20 mLs (40 mg total) into feeding tube daily.   polyethylene glycol 17 g packet Commonly known as: MIRALAX / GLYCOLAX Place 17 g into feeding tube daily as needed.   pregabalin 25 MG capsule Commonly known as: LYRICA Take 1 capsule (25 mg total) by mouth 2 (two) times daily. What changed:  how much to take when to take this   rOPINIRole 2 MG tablet Commonly known as: REQUIP Place 1 tablet (2 mg total) into feeding tube 2 (two) times daily.         DISCHARGE INSTRUCTIONS:   Follow-up PMD 5 days Follow-up cardiology Follow-up CHF clinic Follow-up hematology  If you experience worsening of your admission symptoms, develop shortness of breath, life threatening emergency, suicidal or homicidal thoughts you must seek medical attention immediately by calling 911 or calling your MD immediately  if symptoms less severe.  You Must read complete instructions/literature along with all the possible adverse reactions/side effects for all the Medicines  you take and that have been prescribed to you. Take any new Medicines after you have completely understood and accept all the possible adverse reactions/side effects.   Please note  You were cared for by a hospitalist during your hospital stay. If you have any questions about your discharge medications or the care you received while you were in the hospital after you are discharged, you can call the unit and asked to speak with the hospitalist on call if the hospitalist that took care of you is not available. Once you are discharged, your primary care physician will handle any further medical issues. Please note that NO REFILLS for any discharge medications will be authorized once you are discharged, as it is imperative that you return to your primary care physician (or establish a relationship with a primary care physician if you do not have one) for your aftercare needs so that they can reassess your need for medications and monitor your lab values.    Today   CHIEF COMPLAINT:   Chief Complaint  Patient presents with  . Shortness of Breath    HISTORY OF PRESENT ILLNESS:  [name]  is a 87 y.o. female with a known history of CHF presented with shortness of breath and found to be in CHF exacerbation   VITAL SIGNS:  Blood pressure (!) 160/82, pulse 79, temperature 98.1 F (36.7 C), resp. rate 18, height 5\' 5"  (1.651 m), weight 51.8 kg, SpO2 94 %.  I/O:   Intake/Output Summary (Last 24 hours) at 08/09/2021 1636 Last data filed at 08/09/2021 0739 Gross per 24 hour  Intake --  Output 800 ml  Net -800 ml    PHYSICAL EXAMINATION:  GENERAL:  87 y.o.-year-old patient lying in the bed with no acute distress.  EYES: Pupils equal, round, reactive to light and accommodation. No scleral icterus. Extraocular muscles intact.  HEENT: Head atraumatic, normocephalic. Oropharynx and nasopharynx clear.  LUNGS: Normal breath sounds bilaterally, no wheezing, rales,rhonchi or crepitation. No use  of accessory muscles of respiration.  CARDIOVASCULAR: S1, S2 normal.  3-6 systolic murmurs.  No rubs, or gallops.  ABDOMEN: Soft, non-tender, non-distended.  EXTREMITIES:  No pedal edema, cyanosis, or clubbing.  NEUROLOGIC: Cranial nerves II through XII are intact. Muscle strength 5/5 in all extremities. Sensation intact. Gait not checked.  PSYCHIATRIC: The patient is alert and answers all questions appropriately.  SKIN: No obvious rash, lesion, or ulcer.   DATA REVIEW:   CBC Recent Labs  Lab 08/09/21 0515  WBC 6.8  HGB 7.9*  HCT 25.5*  PLT 352    Chemistries  Recent Labs  Lab 08/09/21 0515  NA 135  K 4.6  CL 96*  CO2 32  GLUCOSE 114*  BUN 49*  CREATININE 1.19*  CALCIUM 8.4*     Microbiology Results  Results for orders placed or performed during the hospital encounter of 08/07/21  Resp Panel by RT-PCR (Flu A&B, Covid) Nasopharyngeal Swab     Status: None   Collection Time: 08/07/21  8:49 PM   Specimen: Nasopharyngeal Swab; Nasopharyngeal(NP) swabs in vial transport medium  Result Value Ref Range Status   SARS Coronavirus 2 by RT PCR NEGATIVE NEGATIVE Final    Comment: (NOTE) SARS-CoV-2 target nucleic acids are NOT DETECTED.  The SARS-CoV-2 RNA is generally detectable in upper respiratory specimens during the acute phase of infection. The lowest concentration of SARS-CoV-2 viral copies this assay can detect is 138 copies/mL. A negative result does not preclude SARS-Cov-2 infection and should not be used as the sole basis for treatment or other patient management decisions. A negative result may occur with  improper specimen collection/handling, submission of specimen other than nasopharyngeal swab, presence of viral mutation(s) within the areas targeted by this assay, and inadequate number of viral copies(<138 copies/mL). A negative result must be combined with clinical observations, patient history, and epidemiological information. The expected result is  Negative.  Fact Sheet for Patients:  EntrepreneurPulse.com.au  Fact Sheet for Healthcare Providers:  IncredibleEmployment.be  This test is no t yet approved or cleared by the [place] FDA and  has been authorized for detection and/or diagnosis of SARS-CoV-2 by FDA under an Emergency Use Authorization (EUA). This EUA will remain  in effect (meaning this test can be used) for the duration of the COVID-19 declaration under Section 564(b)(1) of the Act, 21 U.S.C.section 360bbb-3(b)(1), unless the authorization is terminated  or revoked sooner.       Influenza A by PCR NEGATIVE NEGATIVE Final   Influenza B by PCR NEGATIVE NEGATIVE Final    Comment: (NOTE) The Xpert Xpress SARS-CoV-2/FLU/RSV plus assay is intended as an aid in the diagnosis of influenza from Nasopharyngeal swab specimens and should not be used as a sole basis for treatment. Nasal washings and aspirates are unacceptable for Xpert Xpress SARS-CoV-2/FLU/RSV testing.  Fact Sheet for Patients: EntrepreneurPulse.com.au  Fact Sheet for Healthcare Providers: IncredibleEmployment.be  This test is not yet approved or cleared by the [place] FDA and has been authorized for detection and/or diagnosis of SARS-CoV-2 by FDA under an Emergency Use Authorization (EUA). This EUA will remain in effect (meaning this test can be used) for the duration of the COVID-19 declaration under Section 564(b)(1) of the Act, 21 U.S.C. section 360bbb-3(b)(1), unless the authorization is terminated or revoked.  Performed at [facility], [address] [place], [place] [address]     RADIOLOGY:  DG Chest 2 View  Result Date: 08/07/2021 CLINICAL DATA:  sob/ weakness/ weight gain EXAM: CHEST - 2 VIEW COMPARISON:  Chest x-ray 07/25/2021, CT chest 05/28/2018 FINDINGS: The heart and mediastinal contours are unchanged. Aortic calcification. No focal  consolidation. Redemonstration of increased interstitial  markings. No pulmonary edema. Slight increase in size of a trace to small volume bilateral pleural effusions. No pneumothorax. No acute osseous abnormality. IMPRESSION: 1. Mild pulmonary edema with slightly increased interval increase in size of bilateral trace to small volume pleural effusions. 2.  Aortic Atherosclerosis (ICD10-I70.0). Electronically Signed   By: Iven Finn M.D.   On: 08/07/2021 17:38       Management plans discussed with the patient, family and they are in agreement.  CODE STATUS:     Code Status Orders  (From admission, onward)           Start     Ordered   08/08/21 0154  Do not attempt resuscitation (DNR)  Continuous       Question Answer Comment  In the event of cardiac or respiratory ARREST Do not call a "code blue"   In the event of cardiac or respiratory ARREST Do not perform Intubation, CPR, defibrillation or ACLS   In the event of cardiac or respiratory ARREST Use medication by any route, position, wound care, and other measures to relive pain and suffering. May use oxygen, suction and manual treatment of airway obstruction as needed for comfort.   Comments MOST form on chart.      08/08/21 0153           Code Status History     Date Active Date Inactive Code Status Order ID Comments User Context   07/26/2021 0547 07/28/2021 1616 DNR [id number]  Athena Masse, MD ED   02/16/2021 1445 02/17/2021 1644 DNR [id number]  Asencion Gowda, NP Inpatient   02/15/2021 1847 02/16/2021 1445 Full Code [id number]  Ivor Costa, MD ED   12/22/2020 1320 01/01/2021 1511 Full Code [id number]  Bary Leriche, PA-C Inpatient   12/17/2020 1929 12/22/2020 1300 Full Code [id number]  Rust-Chester, Huel Cote, NP ED   11/23/2019 1331 11/26/2019 1713 Full Code [id number]  Para Skeans, MD ED   06/23/2018 1723 06/25/2018 1835 Full Code [id number]  Nicholes Mango, MD Inpatient   06/11/2018 1559 06/13/2018 1803 Full Code [id number]   Vaughan Basta, MD Inpatient   03/18/2017 1033 03/18/2017 1544 Full Code [id number]  Algernon Huxley, MD Inpatient      Advance Directive Documentation    Flowsheet Row Most Recent Value  Type of Advance Directive Healthcare Power of Attorney, Living will  Pre-existing out of facility DNR order (yellow form or pink MOST form) --  "MOST" Form in Place? --       TOTAL TIME TAKING CARE OF THIS PATIENT: 35 minutes.    Loletha Grayer M.D on 08/09/2021 at 4:36 PM   [facility]  CC: Primary care physician; [name], MD

## 2021-08-09 NOTE — TOC Transition Note (Signed)
Transition of Care [facility]) - CM/SW Discharge Note   Patient Details  Name: [name] Day MRN: [id number] Date of Birth: Jan 17, 1929  [facility] CM/SW Contact:  Alberteen Sam, LCSW Phone Number: 08/09/2021, 9:57 AM   Clinical Narrative:     CSW spoke with patient and daughter [name] who are declining [facility] services at this time. [name] reports patient just had [facility] in the home and can continue the exercises she has learned independently. Reports no DME needs as patient has all equipment home. [name] reports she will be the one to transport patient home today.   No other discharge needs identified at this time.    Final next level of care: Home/Self Care Barriers to Discharge: No Barriers Identified   Patient Goals and CMS Choice Patient states their goals for this hospitalization and ongoing recovery are:: to go home CMS Medicare.gov Compare Post Acute Care list provided to:: Patient Represenative (must comment) (daughter) Choice offered to / list presented to : Adult Children  Discharge Placement                  Name of family member notified: daughter [name] Patient and family notified of of transfer: 08/09/21  Discharge Plan and Services                                     Social Determinants of Health (SDOH) Interventions     Readmission Risk Interventions Readmission Risk Prevention Plan 11/24/2019  Transportation Screening Complete  PCP or Specialist Appt within 3-5 Days Complete  HRI or Home Care Consult Complete  Medication Review (RN Care Manager) Complete  Some recent data might be hidden

## 2021-08-09 NOTE — Progress Notes (Addendum)
   Heart Failure Nurse Navigator Note  HFrEF 40-45%.  Mild LVH.  Grade 2 diastolic dysfunction.  Abnormal longitudinal strain.  Left atria severely dilated.  Right atrium is mildly dilated.  Moderate to severe mitral regurgitation.  Mild to moderate aortic stenosis.   Patient presented with a 3 pound weight gain overnight, worsening shortness of breath. Did not report it because at the time of the weight being performed she had no symptoms.  Met with the patient and her daughter [name].  Discussed the recommendation by the dietitian for 60 mL of water before each feeding and then 60 mL after each feeding.  Also discussed that if in doubt with weight gain to give Otila Kluver a call and run it by her.  [name] voices understanding.  Patient has an appointment with Darylene Price in the outpatient heart failure clinic for tomorrow at 11 AM.  She sees Dr. Ubaldo Glassing next week on the 16th.  They had no further questions.  Pricilla Riffle RN CHFN

## 2021-08-10 ENCOUNTER — Other Ambulatory Visit: Payer: Self-pay

## 2021-08-10 ENCOUNTER — Ambulatory Visit: Payer: Medicare Other | Admitting: Family

## 2021-08-10 ENCOUNTER — Encounter: Payer: Self-pay | Admitting: Family

## 2021-08-10 ENCOUNTER — Ambulatory Visit: Payer: Medicare Other | Attending: Family | Admitting: Family

## 2021-08-10 ENCOUNTER — Telehealth: Payer: Self-pay | Admitting: Oncology

## 2021-08-10 VITALS — BP 133/53 | HR 65 | Resp 16 | Ht 64.0 in | Wt 115.4 lb

## 2021-08-10 DIAGNOSIS — I5022 Chronic systolic (congestive) heart failure: Secondary | ICD-10-CM | POA: Diagnosis not present

## 2021-08-10 DIAGNOSIS — I10 Essential (primary) hypertension: Secondary | ICD-10-CM

## 2021-08-10 DIAGNOSIS — E1122 Type 2 diabetes mellitus with diabetic chronic kidney disease: Secondary | ICD-10-CM | POA: Diagnosis not present

## 2021-08-10 DIAGNOSIS — E875 Hyperkalemia: Secondary | ICD-10-CM | POA: Diagnosis not present

## 2021-08-10 DIAGNOSIS — R059 Cough, unspecified: Secondary | ICD-10-CM | POA: Insufficient documentation

## 2021-08-10 DIAGNOSIS — R42 Dizziness and giddiness: Secondary | ICD-10-CM | POA: Insufficient documentation

## 2021-08-10 DIAGNOSIS — E785 Hyperlipidemia, unspecified: Secondary | ICD-10-CM | POA: Insufficient documentation

## 2021-08-10 DIAGNOSIS — Z79899 Other long term (current) drug therapy: Secondary | ICD-10-CM | POA: Diagnosis not present

## 2021-08-10 DIAGNOSIS — K222 Esophageal obstruction: Secondary | ICD-10-CM | POA: Diagnosis not present

## 2021-08-10 DIAGNOSIS — D509 Iron deficiency anemia, unspecified: Secondary | ICD-10-CM

## 2021-08-10 DIAGNOSIS — I272 Pulmonary hypertension, unspecified: Secondary | ICD-10-CM | POA: Diagnosis not present

## 2021-08-10 DIAGNOSIS — D496 Neoplasm of unspecified behavior of brain: Secondary | ICD-10-CM | POA: Diagnosis not present

## 2021-08-10 DIAGNOSIS — Z931 Gastrostomy status: Secondary | ICD-10-CM | POA: Diagnosis not present

## 2021-08-10 DIAGNOSIS — R197 Diarrhea, unspecified: Secondary | ICD-10-CM | POA: Insufficient documentation

## 2021-08-10 DIAGNOSIS — N1831 Chronic kidney disease, stage 3a: Secondary | ICD-10-CM

## 2021-08-10 DIAGNOSIS — I130 Hypertensive heart and chronic kidney disease with heart failure and stage 1 through stage 4 chronic kidney disease, or unspecified chronic kidney disease: Secondary | ICD-10-CM | POA: Insufficient documentation

## 2021-08-10 DIAGNOSIS — K219 Gastro-esophageal reflux disease without esophagitis: Secondary | ICD-10-CM | POA: Diagnosis not present

## 2021-08-10 DIAGNOSIS — D649 Anemia, unspecified: Secondary | ICD-10-CM | POA: Diagnosis not present

## 2021-08-10 DIAGNOSIS — N189 Chronic kidney disease, unspecified: Secondary | ICD-10-CM | POA: Insufficient documentation

## 2021-08-10 NOTE — Patient Instructions (Addendum)
Continue weighing daily and call for an overnight weight gain of > 2 pounds or a weekly weight gain of >5 pounds. 

## 2021-08-10 NOTE — Telephone Encounter (Signed)
Pt was discharged from hospital on 11-9. Needs appt for 3wks to see Dr. Janese Banks. Call daughter at [phone]

## 2021-08-10 NOTE — Progress Notes (Signed)
Patient ID: [name], female    DOB: Sep 06, 1929, 87 y.o.   MRN: [id number]  HPI  [name] is a 87 y/o female with a history of DM, hyperlipidemia, HTN, CKD, brain tumor, GERD, pulmonary HTN, anemia, G-tube and chronic heart failure.   Echo report from 07/26/21 reviewed and showed an EF of 40-45% along with mild LVH, severe LAE and moderate/ severe MR. Echo report from 12/18/20 reviewed and showed an EF of 35-40% along with mild LVH, moderate LAE and moderate/severe MR. Echo report from 11/24/19 reviewed and showed an EF of 20-25% along with severe MR, mod/severe TR, mild AR, moderately elevated PA pressure and large pleural effusion on the left.   Admitted 08/07/21 due to acute on chronic HF. Initially given IV lasix with transition to oral diuretics. IV iron given due to anemia. Discharged after 2 days. Was in the ED 07/25/21 due to shortness of breath and chest pain. Given IV lasix and she was released.   She presents today for a follow-up visit with a chief complaint of minimal shortness of breath with moderate exertion. She says that this has been chronic in nature having been present for several years but that she feels better since her recent admission. She has associated cough, light-headedness, right sided weakness, diarrhea (due to tube feeds) and easy bruising along with this. She denies any difficulty sleeping, abdominal distention, palpitations, pedal edema, chest pain or weight gain. Received an iron infusion during the admission and feels like that has made the biggest difference.   She says that she can only tolerate 3 tube feedings/ [name] as if the does 4, she reports feeling bloated and uncomfortable in her abdomen. Takes very little liquids by mouth mostly just enough to wet her dry mouth.   Past Medical History:  Diagnosis Date   Anemia    IRON INFUSIONS   Aortic valve sclerosis    Arrhythmia    Arthritis    osteoarthritis   Basal cell carcinoma of skin    Brain  tumor (HCC)    Brain tumor ([place])    Cervical spine disease    CHF (congestive heart failure) (HCC)    Diabetes mellitus without complication (HCC)    Dysrhythmia    sinus arrhythmia   Esophageal stricture    severe, led to feeding tube placement in Sept 2019   Esophageal ulcer without bleeding    Gastrostomy tube dependent ([place])    DOES NOT EAT OR DRINK    GERD (gastroesophageal reflux disease)    Hearing loss    pt had hearing test  last month, left ear30% hearing due to stroke, 78% in right-normal for her age   History of kidney stones    Hyperlipemia    Hypertension    Kidney stones    Leaky heart valve    Meningioma (HCC)    Occlusive mesenteric ischemia (HCC)    Pulmonary hypertension (HCC)    RLS (restless legs syndrome)    Stroke ([place])    Vertigo    Past Surgical History:  Procedure Laterality Date   ABDOMINAL HYSTERECTOMY     APPENDECTOMY     ARTHROGRAM KNEE Left    BACK SURGERY     CERVIVAL NECK FUSION   CATARACT EXTRACTION W/PHACO Left 11/11/2018   Procedure: CATARACT EXTRACTION PHACO AND INTRAOCULAR LENS PLACEMENT ([place]) LEFT, DIABETIC;  Surgeon: Birder Robson, MD;  Location: [facility] ORS;  Service: Ophthalmology;  Laterality: Left;  [place]  00:41 CDE 6.41  Fluid pack lot # [id number]   COLONOSCOPY WITH PROPOFOL N/A 06/20/2017   Procedure: COLONOSCOPY WITH PROPOFOL;  Surgeon: Lollie Sails, MD;  Location: [facility] ENDOSCOPY;  Service: Endoscopy;  Laterality: N/A;   ESOPHAGOGASTRODUODENOSCOPY (EGD) WITH PROPOFOL N/A 03/14/2015   Procedure: ESOPHAGOGASTRODUODENOSCOPY (EGD) WITH PROPOFOL;  Surgeon: Josefine Class, MD;  Location: [facility] ENDOSCOPY;  Service: Endoscopy;  Laterality: N/A;   ESOPHAGOGASTRODUODENOSCOPY (EGD) WITH PROPOFOL N/A 03/28/2017   Procedure: ESOPHAGOGASTRODUODENOSCOPY (EGD) WITH PROPOFOL;  Surgeon: Lollie Sails, MD;  Location[facility] ENDOSCOPY;  Service: Endoscopy;  Laterality: N/A;   ESOPHAGOGASTRODUODENOSCOPY (EGD) WITH PROPOFOL N/A 06/20/2017    Procedure: ESOPHAGOGASTRODUODENOSCOPY (EGD) WITH PROPOFOL;  Surgeon: Lollie Sails, MD;  Location: [facility] ENDOSCOPY;  Service: Endoscopy;  Laterality: N/A;   ESOPHAGOGASTRODUODENOSCOPY (EGD) WITH PROPOFOL N/A 10/21/2017   Procedure: ESOPHAGOGASTRODUODENOSCOPY (EGD) WITH PROPOFOL;  Surgeon: Lollie Sails, MD;  Location: [facility] ENDOSCOPY;  Service: Endoscopy;  Laterality: N/A;   ESOPHAGOGASTRODUODENOSCOPY (EGD) WITH PROPOFOL N/A 04/15/2018   Procedure: ESOPHAGOGASTRODUODENOSCOPY (EGD) WITH PROPOFOL;  Surgeon: Lollie Sails, MD;  Location: [facility] ENDOSCOPY;  Service: Endoscopy;  Laterality: N/A;   ESOPHAGUS SURGERY     CLOSURE   EYE SURGERY     HYSTERECTOMY ABDOMINAL WITH SALPINGECTOMY     IR GASTROSTOMY TUBE MOD SED  06/11/2018   IR GASTROSTOMY TUBE REMOVAL  03/25/2019   IR REPLACE G-TUBE SIMPLE WO FLUORO  10/19/2020   PERIPHERAL VASCULAR CATHETERIZATION N/A 01/23/2016   Procedure: Visceral Venography;  Surgeon: Algernon Huxley, MD;  Location: [place] CV LAB;  Service: Cardiovascular;  Laterality: N/A;   PERIPHERAL VASCULAR CATHETERIZATION  01/23/2016   Procedure: Peripheral Vascular Intervention;  Surgeon: Algernon Huxley, MD;  Location: [place] CV LAB;  Service: Cardiovascular;;   UPPER ESOPHAGEAL ENDOSCOPIC ULTRASOUND (EUS) N/A 12/05/2017   Procedure: UPPER ESOPHAGEAL ENDOSCOPIC ULTRASOUND (EUS);  Surgeon: Jola Schmidt, MD;  Location: [facility] ENDOSCOPY;  Service: Endoscopy;  Laterality: N/A;   VISCERAL ANGIOGRAPHY N/A 03/18/2017   Procedure: Visceral Angiography;  Surgeon: Algernon Huxley, MD;  Location: [place] CV LAB;  Service: Cardiovascular;  Laterality: N/A;   VISCERAL ARTERY INTERVENTION N/A 03/18/2017   Procedure: Visceral Artery Intervention;  Surgeon: Algernon Huxley, MD;  Location: [place] CV LAB;  Service: Cardiovascular;  Laterality: N/A;   Family History  Problem Relation Age of Onset   Stroke Mother    Hypertension Mother    Heart disease Mother    Cancer Father     Heart disease Father    Heart attack Sister    Diabetes Sister    Heart attack Brother    Thyroid cancer Daughter    Cancer - Colon Daughter    Social History   Tobacco Use   Smoking status: Never   Smokeless tobacco: Never  Substance Use Topics   Alcohol use: Never   Allergies  Allergen Reactions   Elemental Sulfur Diarrhea and Nausea And Vomiting   Gabapentin Swelling   Lipitor [Atorvastatin] Other (See Comments)    Muscle aches   Mevacor [Lovastatin] Other (See Comments)    Muscle aches   Milk-Related Compounds Other (See Comments)    Large quantities cause headaches    Septra [Sulfamethoxazole-Trimethoprim] Other (See Comments)    Unknown   Statins Other (See Comments)    Muscle pain   Zocor [Simvastatin] Other (See Comments)    Muscle aches   Prior to Admission medications   Medication Sig Start Date End Date Taking? Authorizing Provider  acetaminophen (TYLENOL) 160 MG/5ML solution  Place 20.3 mLs (650 mg total) into feeding tube every 6 (six) hours as needed for mild pain. 06/13/18  Yes Demetrios Loll, MD  allopurinol (ZYLOPRIM) 100 MG tablet Take by mouth. 05/26/21 11/22/21 Yes [provider]  aspirin 81 MG chewable tablet Place 1 tablet (81 mg total) into feeding tube daily. 12/22/20  Yes Regalado, Belkys A, MD  carvedilol (COREG) 3.125 MG tablet Place 1 tablet (3.125 mg total) into feeding tube 2 (two) times daily with a meal. 12/22/20  Yes Regalado, Belkys A, MD  clopidogrel (PLAVIX) 75 MG tablet GIVE 1 TABLET VIA FEEDING TUBE EVERY [name] AS DIRECTED 12/30/20  Yes Love, Ivan Anchors, PA-C  furosemide (LASIX) 20 MG tablet Place 1 tablet (20 mg total) into feeding tube 2 (two) times daily. 08/09/21  Yes Wieting, Richard, MD  hydrocerin (EUCERIN) CREA Apply 1 application topically 2 (two) times daily. To bilateral feet 12/27/20  Yes Love, Ivan Anchors, PA-C  Hypromellose 0.2 % SOLN Place 1 drop into both eyes 3 (three) times daily as needed (dry eyes).   Yes [provider]   lisinopril (ZESTRIL) 2.5 MG tablet Take 1 tablet by mouth daily. 07/21/21 01/17/22 Yes [provider]  loperamide (IMODIUM) 1 MG/5ML solution Take 1 mg by mouth as needed for diarrhea or loose stools.   Yes [provider]  metFORMIN (GLUCOPHAGE) 500 MG tablet Place 1 tablet (500 mg total) into feeding tube 2 (two) times daily with a meal. 02/17/21  Yes Fritzi Mandes, MD  Multiple Vitamin (MULTIVITAMIN) LIQD Place 15 mLs into feeding tube daily. 06/13/18  Yes Demetrios Loll, MD  Nutritional Supplements (FEEDING SUPPLEMENT, KATE FARMS STANDARD 1.4,) LIQD liquid Place 325 mLs into feeding tube 4 (four) times daily. Patient taking differently: Place 325 mLs into feeding tube 3 (three) times daily between meals. 12/30/20  Yes Love, Ivan Anchors, PA-C  pantoprazole sodium (PROTONIX) 40 mg/20 mL PACK Place 20 mLs (40 mg total) into feeding tube daily. 12/30/20  Yes Love, Ivan Anchors, PA-C  pregabalin (LYRICA) 25 MG capsule Take 1 capsule (25 mg total) by mouth 2 (two) times daily. 08/09/21  Yes Wieting, Richard, MD  rOPINIRole (REQUIP) 2 MG tablet Place 1 tablet (2 mg total) into feeding tube 2 (two) times daily. 12/30/20  Yes Love, Ivan Anchors, PA-C  Water For Irrigation, Sterile (FREE WATER) SOLN Place 120 mLs into feeding tube 4 (four) times daily. 08/09/21  Yes Wieting, Richard, MD  Bromfenac Sodium 0.09 % SOLN Place 1 drop into the left eye 2 (two) times daily. Patient not taking: Reported on 08/10/2021 06/15/21   [provider]  colchicine 0.6 MG tablet Daily for three days the as needed for gout attack Patient not taking: Reported on 08/10/2021 08/09/21   Loletha Grayer, MD  polyethylene glycol (MIRALAX / GLYCOLAX) 17 g packet Place 17 g into feeding tube daily as needed. Patient not taking: Reported on 08/10/2021 12/30/20   Bary Leriche, PA-C   Review of Systems  Constitutional:  Negative for appetite change.  HENT:  Positive for congestion. Negative for postnasal drip and sore throat.    Eyes: Negative.   Respiratory:  Positive for cough and shortness of breath. Negative for chest tightness.   Cardiovascular:  Negative for chest pain, palpitations and leg swelling.  Gastrointestinal:  Positive for diarrhea (after tube feeds). Negative for abdominal distention.  Endocrine: Negative.   Genitourinary: Negative.   Musculoskeletal:  Negative for back pain and neck pain.  Skin: Negative.   Allergic/Immunologic: Negative.  Neurological:  Positive for weakness (right side), light-headedness and numbness (in feet/toes). Negative for dizziness.  Hematological:  Negative for adenopathy. Bruises/bleeds easily (left arm).  Psychiatric/Behavioral:  Negative for dysphoric mood and sleep disturbance (sleeping on 3 pillows). The patient is not nervous/anxious.    Vitals:   08/10/21 1040  BP: (!) 133/53  Pulse: 65  Resp: 16  SpO2: 96%  Weight: 115 lb 6 oz (52.3 kg)  Height: 5\' 4"  (1.626 m)   Wt Readings from Last 3 Encounters:  08/10/21 115 lb 6 oz (52.3 kg)  08/09/21 114 lb 3.2 oz (51.8 kg)  07/25/21 112 lb (50.8 kg)   Lab Results  Component Value Date   CREATININE 1.19 (H) 08/09/2021   CREATININE 0.96 08/08/2021   CREATININE 1.07 (H) 08/07/2021   Physical Exam Vitals and nursing note reviewed. Exam conducted with a chaperone present (daughter).  Constitutional:      Appearance: She is well-developed.  HENT:     Head: Normocephalic and atraumatic.  Neck:     Vascular: No JVD.  Cardiovascular:     Rate and Rhythm: Normal rate and regular rhythm.  Pulmonary:     Effort: Pulmonary effort is normal. No respiratory distress.     Breath sounds: No wheezing or rales.  Abdominal:     Palpations: Abdomen is soft.     Tenderness: There is no abdominal tenderness.  Musculoskeletal:     Cervical back: Normal range of motion and neck supple.     Right lower leg: No tenderness. No edema.     Left lower leg: No tenderness. No edema.  Skin:    General: Skin is warm and dry.   Neurological:     General: No focal deficit present.     Mental Status: She is alert and oriented to person, place, and time.  Psychiatric:        Mood and Affect: Mood normal.        Behavior: Behavior normal.   Assessment & Plan:  1: Chronic heart failure with reduced ejection fraction- - NYHA class II - euvolemic today - weighing daily; reminded to call for an overnight weight gain of >2 pounds or a weekly weight gain of >5 pounds - weight up 3 pounds from last visit here 5 months ago - saw cardiology ([facility]) 05/09/21; returns next week - on GDMT of carvedilol & lisinopril - spironolactone was stopped due to hyperkalemia - consider adding SGLT2 - discussed paramedicine program and patient and daughter were interested; brochure provided and referral faxed - discussed fluid intake through her feeding tube; advised to keep her total fluids to between 1500-2074ml / [name]. Currently she's ~ 146ml/ [name] - BNP 07/27/21 was 1721.6 - reports receiving her flu vaccine for this season  2: HTN- - BP looks good today - saw PCP (Hande) 08/03/21; returns next week - BMP 08/09/21 reviewed and showed sodium 135, potassium 4.6, creatinine 1.19 and GFR 43  3: DM- - A1c 07/26/21 was 6.3% - glucose at home today was 137 - saw endocrinology Pasty Arch) 06/09/20  4: Anemia- - had iron infusion during recent admission - saw hematology Janese Banks) 05/16/21; daughter says that they have to make a f/u appointment - hemoglobin 08/09/21 was 7.9  5: Esophageal stenosis- - saw GI Jacqulyn Liner) 07/17/21 - gets all feeding through G-tube; can only tolerate 3 tube feedings/ [name] due to abdominal bloating/distention   Medication list reviewed.   Return in 1 month or sooner for any questions/problems before then.

## 2021-08-11 NOTE — Telephone Encounter (Signed)
I called daughter later in the day and she go the appt for 12/5 and will be there.

## 2021-08-17 ENCOUNTER — Ambulatory Visit: Payer: Medicare Other | Admitting: Family

## 2021-08-21 ENCOUNTER — Encounter (HOSPITAL_COMMUNITY): Payer: Self-pay

## 2021-08-21 ENCOUNTER — Other Ambulatory Visit (HOSPITAL_COMMUNITY): Payer: Self-pay

## 2021-08-21 NOTE — Progress Notes (Signed)
Today met with [name] for first visit.  She is very pleasant and was a joy to talk with. Her son was in from [place].  Her 2 daughters are close by and usually take care of her.  He is very familiar with her care and assist with them to give them a break when needed.  They have a very strict system in place.  ! Daughter is a retired Therapist, sports and a grandson that is a Tax adviser.  They are familiar of how to give her meds.  All meds are given through her feeding tube.  She does little intake through her mouth, maybe a little ice coffee and a little milk shake.  She states take her 4 days to drink 1 milk shake.  She is urinating fine.  She has lost 1 lbs today, advised them to watch for dehydration if she loses another lbs tomorrow.  They are aware to give fluids if need so.  They said they believe they have finally found a happy balance of how much fluid to give her.  Advised them of how the program works and gave my information with phone numbers to call if any weight gain, feels bad or needs anything I may help with.  She wants to be part of the program.  Her son signed the paper for her.  They are aware of up coming appts.  Her lungs are clear and no edema noted in lungs.  Belly is soft.  She denies any chest pain or increased shortness of breath.  Will continue to visit for heart failure, diet and medication compliance.   [place] [phone]

## 2021-08-31 ENCOUNTER — Inpatient Hospital Stay: Payer: Medicare Other | Admitting: Nurse Practitioner

## 2021-09-01 ENCOUNTER — Telehealth (INDEPENDENT_AMBULATORY_CARE_PROVIDER_SITE_OTHER): Payer: Self-pay | Admitting: Nurse Practitioner

## 2021-09-01 NOTE — Telephone Encounter (Signed)
It sounds like there has been a change in her status, in which case yes we would need the studies to reevaluate

## 2021-09-01 NOTE — Telephone Encounter (Signed)
Pt's daughter called wanting to know if she still needs to bring pt to her appt on 09/12/21. We received a referral from [facility] on 11.14.22 to bring her in for abi + consult. Daughter states that she just had these procedures done in September. Please advise.

## 2021-09-01 NOTE — Telephone Encounter (Signed)
Patient daughter was made aware and verbalized understanding

## 2021-09-04 ENCOUNTER — Encounter: Payer: Self-pay | Admitting: Oncology

## 2021-09-04 ENCOUNTER — Inpatient Hospital Stay: Payer: Medicare Other

## 2021-09-04 ENCOUNTER — Other Ambulatory Visit: Payer: Self-pay

## 2021-09-04 ENCOUNTER — Inpatient Hospital Stay: Payer: Medicare Other | Attending: Nurse Practitioner | Admitting: Oncology

## 2021-09-04 VITALS — BP 120/48 | HR 63 | Temp 97.7°F | Resp 14 | Wt 116.6 lb

## 2021-09-04 DIAGNOSIS — D509 Iron deficiency anemia, unspecified: Secondary | ICD-10-CM | POA: Insufficient documentation

## 2021-09-04 DIAGNOSIS — D649 Anemia, unspecified: Secondary | ICD-10-CM

## 2021-09-04 LAB — BASIC METABOLIC PANEL
Anion gap: 11 (ref 5–15)
BUN: 62 mg/dL — ABNORMAL HIGH (ref 8–23)
CO2: 30 mmol/L (ref 22–32)
Calcium: 8.9 mg/dL (ref 8.9–10.3)
Chloride: 99 mmol/L (ref 98–111)
Creatinine, Ser: 1.11 mg/dL — ABNORMAL HIGH (ref 0.44–1.00)
GFR, Estimated: 47 mL/min — ABNORMAL LOW (ref >60)
Glucose, Bld: 125 mg/dL — ABNORMAL HIGH (ref 70–99)
Potassium: 4.4 mmol/L (ref 3.5–5.1)
Sodium: 140 mmol/L (ref 135–145)

## 2021-09-04 LAB — CBC WITH DIFFERENTIAL/PLATELET
Abs Immature Granulocytes: 0.07 10*3/uL (ref 0.00–0.07)
Basophils Absolute: 0.1 10*3/uL (ref 0.0–0.1)
Basophils Relative: 1 %
Eosinophils Absolute: 0.6 10*3/uL — ABNORMAL HIGH (ref 0.0–0.5)
Eosinophils Relative: 9 %
HCT: 34.4 % — ABNORMAL LOW (ref 36.0–46.0)
Hemoglobin: 10.6 g/dL — ABNORMAL LOW (ref 12.0–15.0)
Immature Granulocytes: 1 %
Lymphocytes Relative: 16 %
Lymphs Abs: 1.1 10*3/uL (ref 0.7–4.0)
MCH: 29.3 pg (ref 26.0–34.0)
MCHC: 30.8 g/dL (ref 30.0–36.0)
MCV: 95 fL (ref 80.0–100.0)
Monocytes Absolute: 0.4 10*3/uL (ref 0.1–1.0)
Monocytes Relative: 6 %
Neutro Abs: 4.8 10*3/uL (ref 1.7–7.7)
Neutrophils Relative %: 67 %
Platelets: 278 10*3/uL (ref 150–400)
RBC: 3.62 MIL/uL — ABNORMAL LOW (ref 3.87–5.11)
RDW: 14.8 % (ref 11.5–15.5)
WBC: 7 10*3/uL (ref 4.0–10.5)
nRBC: 0 % (ref 0.0–0.2)

## 2021-09-04 LAB — RETICULOCYTES
Immature Retic Fract: 12.3 % (ref 2.3–15.9)
RBC.: 3.7 MIL/uL — ABNORMAL LOW (ref 3.87–5.11)
Retic Count, Absolute: 59.2 10*3/uL (ref 19.0–186.0)
Retic Ct Pct: 1.6 % (ref 0.4–3.1)

## 2021-09-04 LAB — VITAMIN B12: Vitamin B-12: 613 pg/mL (ref 180–914)

## 2021-09-04 LAB — FOLATE: Folate: 44 ng/mL (ref >5.9)

## 2021-09-04 NOTE — Progress Notes (Signed)
Hematology/Oncology Consult note [facility]  [phone] [phone]  Patient Care Team: [name], MD as PCP - General (Internal Medicine) [name], MD (Internal Medicine) Ok Edwards, NP as Nurse Practitioner (Gastroenterology) Lollie Sails, MD (Inactive) as Consulting Physician (Gastroenterology) Ovid Callas, NP as Nurse Practitioner (Infectious Diseases)   Name of the patient: [name]  [id number]  1928/11/07   Date of visit: 09/04/21  Diagnosis-iron deficiency anemia  Chief complaint/ Reason for visit-routine follow-up of iron deficiency anemia  Heme/Onc history: Patient is a 87 year old female with history of iron deficiency anemia.  Her last GI work-up including endoscopy and colonoscopy was back in 2018 with Dr. Gustavo Lah.  She does have esophageal stricture which could not be dilated persistently and therefore she has a PEG tube.  She has received IV iron in the past as well as intermittent blood transfusion when her hemoglobin drops.  Also has a history of stroke in March 2022.    Interval history-patient was hospitalized in November 2022 for symptoms of heart failure.  Her anemia had worsened at that time and she also received 400 mg of Venofer.  Patient reports pain in her left toe and she has an upcoming appointment with vascular surgery as well.  ECOG PS- 2 Pain scale- 3  Review of systems- Review of Systems  Constitutional:  Positive for malaise/fatigue.  Musculoskeletal:        Toe pain     Allergies  Allergen Reactions   Elemental Sulfur Diarrhea and Nausea And Vomiting   Gabapentin Swelling   Lipitor [Atorvastatin] Other (See Comments)    Muscle aches   Mevacor [Lovastatin] Other (See Comments)    Muscle aches   Milk-Related Compounds Other (See Comments)    Large quantities cause headaches    Septra [Sulfamethoxazole-Trimethoprim] Other (See Comments)     Unknown   Statins Other (See Comments)    Muscle pain   Zocor [Simvastatin] Other (See Comments)    Muscle aches     Past Medical History:  Diagnosis Date   Anemia    IRON INFUSIONS   Aortic valve sclerosis    Arrhythmia    Arthritis    osteoarthritis   Basal cell carcinoma of skin    Brain tumor (HCC)    Brain tumor (HCC)    Cervical spine disease    CHF (congestive heart failure) (HCC)    Diabetes mellitus without complication (HCC)    Dysrhythmia    sinus arrhythmia   Esophageal stricture    severe, led to feeding tube placement in Sept 2019   Esophageal ulcer without bleeding    Gastrostomy tube dependent (HCC)    DOES NOT EAT OR DRINK    GERD (gastroesophageal reflux disease)    Hearing loss    pt had hearing test  last month, left ear30% hearing due to stroke, 78% in right-normal for her age   History of kidney stones    Hyperlipemia    Hypertension    Kidney stones    Leaky heart valve    Meningioma (HCC)    Occlusive mesenteric ischemia (HCC)    Pulmonary hypertension (HCC)    RLS (restless legs syndrome)    Stroke (HCC)    Vertigo      Past Surgical History:  Procedure Laterality Date   ABDOMINAL HYSTERECTOMY     APPENDECTOMY     ARTHROGRAM KNEE Left    BACK SURGERY     CERVIVAL  NECK FUSION   CATARACT EXTRACTION W/PHACO Left 11/11/2018   Procedure: CATARACT EXTRACTION PHACO AND INTRAOCULAR LENS PLACEMENT (IOC) LEFT, DIABETIC;  Surgeon: Birder Robson, MD;  Location: [facility] ORS;  Service: Ophthalmology;  Laterality: Left;  [place]  00:41 CDE 6.41 Fluid pack lot # [id number]   COLONOSCOPY WITH PROPOFOL N/A 06/20/2017   Procedure: COLONOSCOPY WITH PROPOFOL;  Surgeon: Lollie Sails, MD;  Location: [facility] ENDOSCOPY;  Service: Endoscopy;  Laterality: N/A;   ESOPHAGOGASTRODUODENOSCOPY (EGD) WITH PROPOFOL N/A 03/14/2015   Procedure: ESOPHAGOGASTRODUODENOSCOPY (EGD) WITH PROPOFOL;  Surgeon: Josefine Class, MD;  Location: [facility] ENDOSCOPY;  Service: Endoscopy;   Laterality: N/A;   ESOPHAGOGASTRODUODENOSCOPY (EGD) WITH PROPOFOL N/A 03/28/2017   Procedure: ESOPHAGOGASTRODUODENOSCOPY (EGD) WITH PROPOFOL;  Surgeon: Lollie Sails, MD;  Location: [facility] ENDOSCOPY;  Service: Endoscopy;  Laterality: N/A;   ESOPHAGOGASTRODUODENOSCOPY (EGD) WITH PROPOFOL N/A 06/20/2017   Procedure: ESOPHAGOGASTRODUODENOSCOPY (EGD) WITH PROPOFOL;  Surgeon: Lollie Sails, MD;  Location: [facility] ENDOSCOPY;  Service: Endoscopy;  Laterality: N/A;   ESOPHAGOGASTRODUODENOSCOPY (EGD) WITH PROPOFOL N/A 10/21/2017   Procedure: ESOPHAGOGASTRODUODENOSCOPY (EGD) WITH PROPOFOL;  Surgeon: Lollie Sails, MD;  Location: [facility] ENDOSCOPY;  Service: Endoscopy;  Laterality: N/A;   ESOPHAGOGASTRODUODENOSCOPY (EGD) WITH PROPOFOL N/A 04/15/2018   Procedure: ESOPHAGOGASTRODUODENOSCOPY (EGD) WITH PROPOFOL;  Surgeon: Lollie Sails, MD;  Location: [facility] ENDOSCOPY;  Service: Endoscopy;  Laterality: N/A;   ESOPHAGUS SURGERY     CLOSURE   EYE SURGERY     HYSTERECTOMY ABDOMINAL WITH SALPINGECTOMY     IR GASTROSTOMY TUBE MOD SED  06/11/2018   IR GASTROSTOMY TUBE REMOVAL  03/25/2019   IR REPLACE G-TUBE SIMPLE WO FLUORO  10/19/2020   PERIPHERAL VASCULAR CATHETERIZATION N/A 01/23/2016   Procedure: Visceral Venography;  Surgeon: Algernon Huxley, MD;  Location: [place] CV LAB;  Service: Cardiovascular;  Laterality: N/A;   PERIPHERAL VASCULAR CATHETERIZATION  01/23/2016   Procedure: Peripheral Vascular Intervention;  Surgeon: Algernon Huxley, MD;  Location: [place] [facility];  Service: Cardiovascular;;   UPPER ESOPHAGEAL ENDOSCOPIC ULTRASOUND (EUS) N/A 12/05/2017   Procedure: UPPER ESOPHAGEAL ENDOSCOPIC ULTRASOUND (EUS);  Surgeon: Jola Schmidt, MD;  Location: [facility] ENDOSCOPY;  Service: Endoscopy;  Laterality: N/A;   VISCERAL ANGIOGRAPHY N/A 03/18/2017   Procedure: Visceral Angiography;  Surgeon: Algernon Huxley, MD;  Location: [facility] [place] [facility];  Service: Cardiovascular;  Laterality: N/A;   VISCERAL ARTERY  INTERVENTION N/A 03/18/2017   Procedure: Visceral Artery Intervention;  Surgeon: Algernon Huxley, MD;  Location: [place] [facility];  Service: Cardiovascular;  Laterality: N/A;    Social History   Socioeconomic History   Marital status: Widowed    Spouse name: Not on file   Number of children: Not on file   Years of education: Not on file   Highest education level: Not on file  Occupational History   Not on file  Tobacco Use   Smoking status: Never   Smokeless tobacco: Never  Vaping Use   Vaping Use: Never used  Substance and Sexual Activity   Alcohol use: Never   Drug use: Never   Sexual activity: Not Currently  Other Topics Concern   Not on file  Social History Narrative   Lives alone.    Daughter helps her.    Social Determinants of Health   Financial Resource Strain: Not on file  Food Insecurity: Not on file  Transportation Needs: Not on file  Physical Activity: Not on file  Stress: Not on file  Social Connections: Not on file  Intimate  Partner Violence: Not on file    Family History  Problem Relation Age of Onset   Stroke Mother    Hypertension Mother    Heart disease Mother    Cancer Father    Heart disease Father    Heart attack Sister    Diabetes Sister    Heart attack Brother    Thyroid cancer Daughter    Cancer - Colon Daughter      Current Outpatient Medications:    acetaminophen (TYLENOL) 160 MG/5ML solution, Place 20.3 mLs (650 mg total) into feeding tube every 6 (six) hours as needed for mild pain., Disp: 120 mL, Rfl: 0   allopurinol (ZYLOPRIM) 100 MG tablet, Take by mouth., Disp: , Rfl:    aspirin 81 MG chewable tablet, by Nasogastric route., Disp: , Rfl:    carvedilol (COREG) 3.125 MG tablet, Place 1 tablet (3.125 mg total) into feeding tube 2 (two) times daily with a meal., Disp: 30 tablet, Rfl: 3   clopidogrel (PLAVIX) 75 MG tablet, GIVE 1 TABLET VIA FEEDING TUBE EVERY [name] AS DIRECTED, Disp: 30 tablet, Rfl: 2   furosemide (LASIX) 20 MG  tablet, Place 1 tablet (20 mg total) into feeding tube 2 (two) times daily., Disp: , Rfl:    glipiZIDE (GLUCOTROL) 5 MG tablet, Take 5 mg by mouth every morning., Disp: , Rfl:    Hypromellose 0.2 % SOLN, Place 1 drop into both eyes 3 (three) times daily as needed (dry eyes)., Disp: , Rfl:    lisinopril (ZESTRIL) 2.5 MG tablet, Take 1 tablet by mouth daily., Disp: , Rfl:    loperamide (IMODIUM) 1 MG/5ML solution, Take 1 mg by mouth as needed for diarrhea or loose stools., Disp: , Rfl:    metFORMIN (GLUCOPHAGE) 500 MG tablet, Place 1 tablet (500 mg total) into feeding tube 2 (two) times daily with a meal., Disp: 30 tablet, Rfl: 1   Multiple Vitamin (MULTIVITAMIN) LIQD, Place 15 mLs into feeding tube daily., Disp: 450 mL, Rfl: 1   Nutritional Supplements (FEEDING SUPPLEMENT, KATE FARMS STANDARD 1.4,) LIQD liquid, Place 325 mLs into feeding tube 4 (four) times daily. (Patient taking differently: Place 325 mLs into feeding tube 3 (three) times daily between meals.), Disp: , Rfl:    pantoprazole sodium (PROTONIX) 40 mg/20 mL PACK, Place 20 mLs (40 mg total) into feeding tube daily., Disp: 600 mL, Rfl: 0   pregabalin (LYRICA) 25 MG capsule, Take 1 capsule (25 mg total) by mouth 2 (two) times daily., Disp: , Rfl:    rOPINIRole (REQUIP) 2 MG tablet, Place 1 tablet (2 mg total) into feeding tube 2 (two) times daily., Disp: 60 tablet, Rfl: 0   Water For Irrigation, Sterile (FREE WATER) SOLN, Place 120 mLs into feeding tube 4 (four) times daily., Disp: , Rfl:    ascorbic acid (VITAMIN C) 250 MG tablet, Take 1 tablet by G tube 2 (two) times daily (Patient not taking: Reported on 09/04/2021), Disp: , Rfl:    Bromfenac Sodium 0.09 % SOLN, Place 1 drop into the left eye 2 (two) times daily. (Patient not taking: Reported on 08/10/2021), Disp: , Rfl:    colchicine 0.6 MG tablet, Daily for three days the as needed for gout attack (Patient not taking: Reported on 08/10/2021), Disp: 10 tablet, Rfl: 0   hydrocerin (EUCERIN)  CREA, Apply 1 application topically 2 (two) times daily. To bilateral feet (Patient not taking: Reported on 09/04/2021), Disp: , Rfl: 0   polyethylene glycol (MIRALAX / GLYCOLAX) 17 g packet, Place  17 g into feeding tube daily as needed. (Patient not taking: Reported on 09/04/2021), Disp: 14 each, Rfl: 0  Physical exam:  Vitals:   09/04/21 0927  BP: (!) 120/48  Pulse: 63  Resp: 14  Temp: 97.7 F (36.5 C)  SpO2: 100%  Weight: 116 lb 9.6 oz (52.9 kg)   Physical Exam Constitutional:      General: She is not in acute distress.    Comments: Sitting in a wheelchair  Cardiovascular:     Rate and Rhythm: Normal rate and regular rhythm.     Heart sounds: Normal heart sounds.  Pulmonary:     Effort: Pulmonary effort is normal.     Breath sounds: Normal breath sounds.  Skin:    General: Skin is warm and dry.  Neurological:     Mental Status: She is alert and oriented to person, place, and time.     CMP Latest Ref Rng & Units 09/04/2021  Glucose 70 - 99 mg/dL 125(H)  BUN 8 - 23 mg/dL 62(H)  Creatinine 0.44 - 1.00 mg/dL 1.11(H)  Sodium 135 - 145 mmol/L 140  Potassium 3.5 - 5.1 mmol/L 4.4  Chloride 98 - 111 mmol/L 99  CO2 22 - 32 mmol/L 30  Calcium 8.9 - 10.3 mg/dL 8.9  Total Protein 6.5 - 8.1 g/dL -  Total Bilirubin 0.3 - 1.2 mg/dL -  Alkaline Phos 38 - 126 U/L -  AST 15 - 41 U/L -  ALT 0 - 44 U/L -   CBC Latest Ref Rng & Units 09/04/2021  WBC 4.0 - 10.5 K/uL 7.0  Hemoglobin 12.0 - 15.0 g/dL 10.6(L)  Hematocrit 36.0 - 46.0 % 34.4(L)  Platelets 150 - 400 K/uL 278    No images are attached to the encounter.  DG Chest 2 View  Result Date: 08/07/2021 CLINICAL DATA:  sob/ weakness/ weight gain EXAM: CHEST - 2 VIEW COMPARISON:  Chest x-ray 07/25/2021, CT chest 05/28/2018 FINDINGS: The heart and mediastinal contours are unchanged. Aortic calcification. No focal consolidation. Redemonstration of increased interstitial markings. No pulmonary edema. Slight increase in size of a trace to  small volume bilateral pleural effusions. No pneumothorax. No acute osseous abnormality. IMPRESSION: 1. Mild pulmonary edema with slightly increased interval increase in size of bilateral trace to small volume pleural effusions. 2.  Aortic Atherosclerosis (ICD10-I70.0). Electronically Signed   By: Iven Finn M.D.   On: 08/07/2021 17:38     Assessment and plan- Patient is a 87 y.o. female with longstanding history of iron deficiency anemia here for routine follow-up  Patient's baseline hemoglobin runs of between 9-10.  Her hemoglobin dropped down to 7 when she was hospitalized in November 2022 and received 400 mg of Venofer.  Her ferritin level from11/11/2020 was normal at 77 and therefore do not think that patient requires any IV iron at this time.  We will check CBC BMP myeloma panel and free light chains haptoglobin folate reticulocyte count and B12 today.  Repeat CBC ferritin and iron studies in 3 in 6 months and I will see her back in 6 months   Visit Diagnosis 1. Normocytic anemia      Dr. Randa Evens, MD, MPH [facility] [phone] 09/04/2021 12:53 PM

## 2021-09-05 ENCOUNTER — Other Ambulatory Visit (HOSPITAL_COMMUNITY): Payer: Self-pay

## 2021-09-05 LAB — KAPPA/LAMBDA LIGHT CHAINS
Kappa free light chain: 71.2 mg/L — ABNORMAL HIGH (ref 3.3–19.4)
Kappa, lambda light chain ratio: 1.51 (ref 0.26–1.65)
Lambda free light chains: 47.2 mg/L — ABNORMAL HIGH (ref 5.7–26.3)

## 2021-09-05 LAB — HAPTOGLOBIN: Haptoglobin: 116 mg/dL (ref 41–333)

## 2021-09-06 ENCOUNTER — Encounter (HOSPITAL_COMMUNITY): Payer: Self-pay

## 2021-09-06 NOTE — Progress Notes (Signed)
Norlene Duel had a home visit with Sydell Axon.  He reported back to me of his finidings.  She is doing well.  She is complaining of her toe nails turning back and nails falling off after her podiatry appt. Her podiatry is following her closely.  She has no toher complaints at this time.  Her faimily watches her weight and fluids very closely.  She weighs daily.  They are aware if she loses she needs more fluids and if she gains to cut back.  She gets everything by feeding tube.  She takes little in by mouth.  She enjoys milk shakes but takes a few days to drink one.  She has good family support.  They keep up with meds and appts.  There is always someone there with her.  She denies any chest pain, headaches or increased shortness of breath.  She states she does not feel full with fluid.  No edema noted in extremities and abdomen is soft.  Will continue to visit for heart failure.   Bernard [phone]

## 2021-09-07 LAB — MULTIPLE MYELOMA PANEL, SERUM
Albumin SerPl Elph-Mcnc: 3.7 g/dL (ref 2.9–4.4)
Albumin/Glob SerPl: 1.3 (ref 0.7–1.7)
Alpha 1: 0.2 g/dL (ref 0.0–0.4)
Alpha2 Glob SerPl Elph-Mcnc: 0.8 g/dL (ref 0.4–1.0)
B-Globulin SerPl Elph-Mcnc: 1.1 g/dL (ref 0.7–1.3)
Gamma Glob SerPl Elph-Mcnc: 0.9 g/dL (ref 0.4–1.8)
Globulin, Total: 2.9 g/dL (ref 2.2–3.9)
IgA: 357 mg/dL (ref 64–422)
IgG (Immunoglobin G), Serum: 882 mg/dL (ref 586–1602)
IgM (Immunoglobulin M), Srm: 108 mg/dL (ref 26–217)
Total Protein ELP: 6.6 g/dL (ref 6.0–8.5)

## 2021-09-10 NOTE — Progress Notes (Signed)
Patient ID: [name], female    DOB: 06/13/1929, 87 y.o.   MRN: [id number]  HPI  [name] is a 87 y/o female with a history of DM, hyperlipidemia, HTN, CKD, brain tumor, GERD, pulmonary HTN, anemia, G-tube and chronic heart failure.   Echo report from 07/26/21 reviewed and showed an EF of 40-45% along with mild LVH, severe LAE and moderate/ severe MR. Echo report from 12/18/20 reviewed and showed an EF of 35-40% along with mild LVH, moderate LAE and moderate/severe MR. Echo report from 11/24/19 reviewed and showed an EF of 20-25% along with severe MR, mod/severe TR, mild AR, moderately elevated PA pressure and large pleural effusion on the left.   Admitted 08/07/21 due to acute on chronic HF. Initially given IV lasix with transition to oral diuretics. IV iron given due to anemia. Discharged after 2 days. Was in the ED 07/25/21 due to shortness of breath and chest pain. Given IV lasix and she was released.   She presents today for a follow-up visit with a chief complaint of minimal shortness of breath upon moderate exertion. She describes this as chronic in nature having been present for several years. She has associated light-headedness, neuropathy, weakness and easy bruising along with this. She denies any difficulty sleeping, abdominal distention (except after tube feedings), palpitations, pedal edema, chest pain, cough or fatigue.   She says that she can only tolerate 3 tube feedings/ [name] as if the does 4, she reports feeling bloated and uncomfortable in her abdomen. Takes very little liquids by mouth mostly just enough to wet her dry mouth.   Seeing vascular later today as she had some toenails on her left foot turn black and then fall off. Was quite uncomfortable until the nails fell off but the pain has since improved some. Feels like the nailbed is healing but now she's noticing discomfort on the right foot. Has also noticed some weakness in both hands making it difficult to pick up  objects.   Past Medical History:  Diagnosis Date   Anemia    IRON INFUSIONS   Aortic valve sclerosis    Arrhythmia    Arthritis    osteoarthritis   Basal cell carcinoma of skin    Brain tumor (HCC)    Brain tumor ([place])    Cervical spine disease    CHF (congestive heart failure) (HCC)    Diabetes mellitus without complication (HCC)    Dysrhythmia    sinus arrhythmia   Esophageal stricture    severe, led to feeding tube placement in Sept 2019   Esophageal ulcer without bleeding    Gastrostomy tube dependent ([place])    DOES NOT EAT OR DRINK    GERD (gastroesophageal reflux disease)    Hearing loss    pt had hearing test  last month, left ear30% hearing due to stroke, 78% in right-normal for her age   History of kidney stones    Hyperlipemia    Hypertension    Kidney stones    Leaky heart valve    Meningioma (HCC)    Occlusive mesenteric ischemia (HCC)    Pulmonary hypertension (HCC)    RLS (restless legs syndrome)    Stroke (HCC)    Vertigo    Past Surgical History:  Procedure Laterality Date   ABDOMINAL HYSTERECTOMY     APPENDECTOMY     ARTHROGRAM KNEE Left    BACK SURGERY     CERVIVAL NECK FUSION   CATARACT EXTRACTION W/PHACO  Left 11/11/2018   Procedure: CATARACT EXTRACTION PHACO AND INTRAOCULAR LENS PLACEMENT (IOC) LEFT, DIABETIC;  Surgeon: Birder Robson, MD;  Location: [facility] ORS;  Service: Ophthalmology;  Laterality: Left;  [place]  00:41 CDE 6.41 Fluid pack lot # [id number]   COLONOSCOPY WITH PROPOFOL N/A 06/20/2017   Procedure: COLONOSCOPY WITH PROPOFOL;  Surgeon: Lollie Sails, MD;  [facility] ENDOSCOPY;  Service: Endoscopy;  Laterality: N/A;   ESOPHAGOGASTRODUODENOSCOPY (EGD) WITH PROPOFOL N/A 03/14/2015   Procedure: ESOPHAGOGASTRODUODENOSCOPY (EGD) WITH PROPOFOL;  Surgeon: Josefine Class, MD;  Location: [facility] ENDOSCOPY;  Service: Endoscopy;  Laterality: N/A;   ESOPHAGOGASTRODUODENOSCOPY (EGD) WITH PROPOFOL N/A 03/28/2017   Procedure:  ESOPHAGOGASTRODUODENOSCOPY (EGD) WITH PROPOFOL;  Surgeon: Lollie Sails, MD;  Location[facility] ENDOSCOPY;  Service: Endoscopy;  Laterality: N/A;   ESOPHAGOGASTRODUODENOSCOPY (EGD) WITH PROPOFOL N/A 06/20/2017   Procedure: ESOPHAGOGASTRODUODENOSCOPY (EGD) WITH PROPOFOL;  Surgeon: Lollie Sails, MD;  Location: [facility] ENDOSCOPY;  Service: Endoscopy;  Laterality: N/A;   ESOPHAGOGASTRODUODENOSCOPY (EGD) WITH PROPOFOL N/A 10/21/2017   Procedure: ESOPHAGOGASTRODUODENOSCOPY (EGD) WITH PROPOFOL;  Surgeon: Lollie Sails, MD;  Location: [facility] ENDOSCOPY;  Service: Endoscopy;  Laterality: N/A;   ESOPHAGOGASTRODUODENOSCOPY (EGD) WITH PROPOFOL N/A 04/15/2018   Procedure: ESOPHAGOGASTRODUODENOSCOPY (EGD) WITH PROPOFOL;  Surgeon: Lollie Sails, MD;  Location: [facility] ENDOSCOPY;  Service: Endoscopy;  Laterality: N/A;   ESOPHAGUS SURGERY     CLOSURE   EYE SURGERY     HYSTERECTOMY ABDOMINAL WITH SALPINGECTOMY     IR GASTROSTOMY TUBE MOD SED  06/11/2018   IR GASTROSTOMY TUBE REMOVAL  03/25/2019   IR REPLACE G-TUBE SIMPLE WO FLUORO  10/19/2020   PERIPHERAL VASCULAR CATHETERIZATION N/A 01/23/2016   Procedure: Visceral Venography;  Surgeon: Algernon Huxley, MD;  Location: [place] CV LAB;  Service: Cardiovascular;  Laterality: N/A;   PERIPHERAL VASCULAR CATHETERIZATION  01/23/2016   Procedure: Peripheral Vascular Intervention;  Surgeon: Algernon Huxley, MD;  Location: [place] CV LAB;  Service: Cardiovascular;;   UPPER ESOPHAGEAL ENDOSCOPIC ULTRASOUND (EUS) N/A 12/05/2017   Procedure: UPPER ESOPHAGEAL ENDOSCOPIC ULTRASOUND (EUS);  Surgeon: Jola Schmidt, MD;  Location: [facility] ENDOSCOPY;  Service: Endoscopy;  Laterality: N/A;   VISCERAL ANGIOGRAPHY N/A 03/18/2017   Procedure: Visceral Angiography;  Surgeon: Algernon Huxley, MD;  Location: [place] CV LAB;  Service: Cardiovascular;  Laterality: N/A;   VISCERAL ARTERY INTERVENTION N/A 03/18/2017   Procedure: Visceral Artery Intervention;  Surgeon: Algernon Huxley, MD;   Location: [place] CV LAB;  Service: Cardiovascular;  Laterality: N/A;   Family History  Problem Relation Age of Onset   Stroke Mother    Hypertension Mother    Heart disease Mother    Cancer Father    Heart disease Father    Heart attack Sister    Diabetes Sister    Heart attack Brother    Thyroid cancer Daughter    Cancer - Colon Daughter    Social History   Tobacco Use   Smoking status: Never   Smokeless tobacco: Never  Substance Use Topics   Alcohol use: Never   Allergies  Allergen Reactions   Elemental Sulfur Diarrhea and Nausea And Vomiting   Gabapentin Swelling   Lipitor [Atorvastatin] Other (See Comments)    Muscle aches   Mevacor [Lovastatin] Other (See Comments)    Muscle aches   Milk-Related Compounds Other (See Comments)    Large quantities cause headaches    Septra [Sulfamethoxazole-Trimethoprim] Other (See Comments)    Unknown   Statins Other (See Comments)    Muscle  pain   Zocor [Simvastatin] Other (See Comments)    Muscle aches   Prior to Admission medications   Medication Sig Start Date End Date Taking? Authorizing Provider  acetaminophen (TYLENOL) 160 MG/5ML solution Place 20.3 mLs (650 mg total) into feeding tube every 6 (six) hours as needed for mild pain. 06/13/18  Yes Demetrios Loll, MD  allopurinol (ZYLOPRIM) 100 MG tablet Take by mouth. 05/26/21 11/22/21 Yes [provider]  aspirin 81 MG chewable tablet by Nasogastric route. 12/22/20  Yes [provider]  carvedilol (COREG) 3.125 MG tablet Place 1 tablet (3.125 mg total) into feeding tube 2 (two) times daily with a meal. 12/22/20  Yes Regalado, Belkys A, MD  clopidogrel (PLAVIX) 75 MG tablet GIVE 1 TABLET VIA FEEDING TUBE EVERY [name] AS DIRECTED 12/30/20  Yes Love, Ivan Anchors, PA-C  furosemide (LASIX) 20 MG tablet Place 1 tablet (20 mg total) into feeding tube 2 (two) times daily. 08/09/21  Yes Wieting, Richard, MD  glipiZIDE (GLUCOTROL) 5 MG tablet Take 5 mg by mouth every morning.  08/30/21  Yes [provider]  hydrocerin (EUCERIN) CREA Apply 1 application topically 2 (two) times daily. To bilateral feet 12/27/20  Yes Love, Ivan Anchors, PA-C  lisinopril (ZESTRIL) 2.5 MG tablet Take 1 tablet by mouth daily. 07/21/21 01/17/22 Yes [provider]  loperamide (IMODIUM) 1 MG/5ML solution Take 1 mg by mouth as needed for diarrhea or loose stools.   Yes [provider]  metFORMIN (GLUCOPHAGE) 500 MG tablet Place 1 tablet (500 mg total) into feeding tube 2 (two) times daily with a meal. 02/17/21  Yes Fritzi Mandes, MD  Multiple Vitamin (MULTIVITAMIN) LIQD Place 15 mLs into feeding tube daily. 06/13/18  Yes Demetrios Loll, MD  Nutritional Supplements (FEEDING SUPPLEMENT, KATE FARMS STANDARD 1.4,) LIQD liquid Place 325 mLs into feeding tube 4 (four) times daily. Patient taking differently: Place 325 mLs into feeding tube 3 (three) times daily between meals. 12/30/20  Yes Love, Ivan Anchors, PA-C  pantoprazole sodium (PROTONIX) 40 mg/20 mL PACK Place 20 mLs (40 mg total) into feeding tube daily. 12/30/20  Yes Love, Ivan Anchors, PA-C  pregabalin (LYRICA) 25 MG capsule Take 1 capsule (25 mg total) by mouth 2 (two) times daily. Patient taking differently: Take 25 mg by mouth daily. 08/09/21  Yes Wieting, Richard, MD  rOPINIRole (REQUIP) 2 MG tablet Place 1 tablet (2 mg total) into feeding tube 2 (two) times daily. 12/30/20  Yes Love, Ivan Anchors, PA-C  Water For Irrigation, Sterile (FREE WATER) SOLN Place 120 mLs into feeding tube 4 (four) times daily. Patient taking differently: Place 160 mLs into feeding tube 3 (three) times daily. 08/09/21  Yes Wieting, Richard, MD  ascorbic acid (VITAMIN C) 250 MG tablet Take 1 tablet by G tube 2 (two) times daily Patient not taking: Reported on 09/04/2021 06/13/18   [provider]  Bromfenac Sodium 0.09 % SOLN Place 1 drop into the left eye 2 (two) times daily. Patient not taking: Reported on 08/10/2021 06/15/21   [provider]   colchicine 0.6 MG tablet Daily for three days the as needed for gout attack Patient not taking: Reported on 08/10/2021 08/09/21   Loletha Grayer, MD  Hypromellose 0.2 % SOLN Place 1 drop into both eyes 3 (three) times daily as needed (dry eyes). Patient not taking: Reported on 09/12/2021    [provider]  polyethylene glycol (MIRALAX / GLYCOLAX) 17 g packet Place 17 g into feeding tube daily as needed. Patient not taking:  Reported on 09/04/2021 12/30/20   Bary Leriche, PA-C    Review of Systems  Constitutional:  Negative for appetite change and fatigue.  HENT:  Positive for congestion. Negative for postnasal drip and sore throat.   Eyes: Negative.   Respiratory:  Positive for shortness of breath. Negative for cough and chest tightness.   Cardiovascular:  Negative for chest pain, palpitations and leg swelling.  Gastrointestinal:  Positive for diarrhea (after tube feeds). Negative for abdominal distention.  Endocrine: Negative.   Genitourinary: Negative.   Musculoskeletal:  Negative for back pain and neck pain.  Skin: Negative.   Allergic/Immunologic: Negative.   Neurological:  Positive for weakness (right side), light-headedness and numbness (in feet/toes). Negative for dizziness.  Hematological:  Negative for adenopathy. Bruises/bleeds easily (left arm).  Psychiatric/Behavioral:  Negative for dysphoric mood and sleep disturbance (sleeping on 3 pillows). The patient is not nervous/anxious.    Vitals:   09/12/21 1147  BP: 137/67  Pulse: 75  Resp: 18  SpO2: 100%  Weight: 118 lb (53.5 kg)  Height: 5\' 3"  (1.6 m)   Wt Readings from Last 3 Encounters:  09/12/21 118 lb (53.5 kg)  09/06/21 110 lb (49.9 kg)  09/04/21 116 lb 9.6 oz (52.9 kg)   Lab Results  Component Value Date   CREATININE 1.11 (H) 09/04/2021   CREATININE 1.19 (H) 08/09/2021   CREATININE 0.96 08/08/2021    Physical Exam Vitals and nursing note reviewed. Exam conducted with a chaperone present  (daughter).  Constitutional:      Appearance: She is well-developed.  HENT:     Head: Normocephalic and atraumatic.  Neck:     Vascular: No JVD.  Cardiovascular:     Rate and Rhythm: Normal rate and regular rhythm.  Pulmonary:     Effort: Pulmonary effort is normal. No respiratory distress.     Breath sounds: No wheezing or rales.  Abdominal:     Palpations: Abdomen is soft.     Tenderness: There is no abdominal tenderness.  Musculoskeletal:     Cervical back: Normal range of motion and neck supple.     Right lower leg: No tenderness. No edema.     Left lower leg: No tenderness. No edema.  Skin:    General: Skin is warm and dry.     Findings: Bruising (left wrist and right forearm) present.  Neurological:     General: No focal deficit present.     Mental Status: She is alert and oriented to person, place, and time.  Psychiatric:        Mood and Affect: Mood normal.        Behavior: Behavior normal.   Assessment & Plan:  1: Chronic heart failure with reduced ejection fraction- - NYHA class II - euvolemic today - weighing daily; reminded to call for an overnight weight gain of >2 pounds or a weekly weight gain of >5 pounds - weight up 3 pounds from last visit here 6 weeks ago - saw cardiology (Fath) 08/16/21 - on GDMT of carvedilol & lisinopril - spironolactone was stopped due to hyperkalemia - consider adding SGLT2 - participating in paramedicine program - discussed fluid intake through her feeding tube; advised to keep her total fluids to between 1500-2048ml / [name].  - BNP 07/27/21 was 1721.6 - reports receiving her flu vaccine for this season  2: HTN- - BP looks good (137/67) - saw PCP (Hande) 08/03/21; returns Feb 2023 - BMP 09/04/21 reviewed and showed sodium 140, potassium 4.4, creatinine 1.11  and GFR 47  3: DM- - A1c 08/03/21 was 5.4% - glucose at home today was 148 - saw endocrinology Pasty Arch) 06/09/20  4: Anemia- - saw hematology Janese Banks) 09/04/21 - hemoglobin  08/14/21 was 9.8  5: Esophageal stenosis- - saw GI Jacqulyn Liner) 07/17/21 - gets all feeding through G-tube; can only tolerate 3 tube feedings/ [name] due to abdominal bloating/distention   Medication list reviewed.   Due to HF stability will not make a return appointment for patient at this time. Advised patient and her daughter to follow closely with cardiology/ PCP but that she could call back at anytime to schedule another appointment and they were comfortable with this plan.

## 2021-09-11 ENCOUNTER — Other Ambulatory Visit (INDEPENDENT_AMBULATORY_CARE_PROVIDER_SITE_OTHER): Payer: Self-pay | Admitting: Nurse Practitioner

## 2021-09-11 DIAGNOSIS — L819 Disorder of pigmentation, unspecified: Secondary | ICD-10-CM

## 2021-09-12 ENCOUNTER — Ambulatory Visit (INDEPENDENT_AMBULATORY_CARE_PROVIDER_SITE_OTHER): Payer: Medicare Other | Admitting: Nurse Practitioner

## 2021-09-12 ENCOUNTER — Encounter: Payer: Self-pay | Admitting: Family

## 2021-09-12 ENCOUNTER — Ambulatory Visit (INDEPENDENT_AMBULATORY_CARE_PROVIDER_SITE_OTHER): Payer: Medicare Other

## 2021-09-12 ENCOUNTER — Other Ambulatory Visit: Payer: Self-pay

## 2021-09-12 ENCOUNTER — Ambulatory Visit: Payer: Medicare Other | Attending: Family | Admitting: Family

## 2021-09-12 ENCOUNTER — Encounter (INDEPENDENT_AMBULATORY_CARE_PROVIDER_SITE_OTHER): Payer: Self-pay | Admitting: Nurse Practitioner

## 2021-09-12 VITALS — BP 137/67 | HR 75 | Resp 18 | Ht 63.0 in | Wt 118.0 lb

## 2021-09-12 VITALS — BP 129/74 | HR 72 | Resp 16 | Wt 117.8 lb

## 2021-09-12 DIAGNOSIS — I272 Pulmonary hypertension, unspecified: Secondary | ICD-10-CM | POA: Insufficient documentation

## 2021-09-12 DIAGNOSIS — E1169 Type 2 diabetes mellitus with other specified complication: Secondary | ICD-10-CM

## 2021-09-12 DIAGNOSIS — N189 Chronic kidney disease, unspecified: Secondary | ICD-10-CM | POA: Diagnosis not present

## 2021-09-12 DIAGNOSIS — D649 Anemia, unspecified: Secondary | ICD-10-CM | POA: Diagnosis not present

## 2021-09-12 DIAGNOSIS — D631 Anemia in chronic kidney disease: Secondary | ICD-10-CM | POA: Insufficient documentation

## 2021-09-12 DIAGNOSIS — Z931 Gastrostomy status: Secondary | ICD-10-CM | POA: Insufficient documentation

## 2021-09-12 DIAGNOSIS — E1140 Type 2 diabetes mellitus with diabetic neuropathy, unspecified: Secondary | ICD-10-CM | POA: Diagnosis not present

## 2021-09-12 DIAGNOSIS — L819 Disorder of pigmentation, unspecified: Secondary | ICD-10-CM

## 2021-09-12 DIAGNOSIS — E785 Hyperlipidemia, unspecified: Secondary | ICD-10-CM

## 2021-09-12 DIAGNOSIS — I130 Hypertensive heart and chronic kidney disease with heart failure and stage 1 through stage 4 chronic kidney disease, or unspecified chronic kidney disease: Secondary | ICD-10-CM | POA: Diagnosis present

## 2021-09-12 DIAGNOSIS — E1122 Type 2 diabetes mellitus with diabetic chronic kidney disease: Secondary | ICD-10-CM | POA: Insufficient documentation

## 2021-09-12 DIAGNOSIS — I70222 Atherosclerosis of native arteries of extremities with rest pain, left leg: Secondary | ICD-10-CM | POA: Diagnosis not present

## 2021-09-12 DIAGNOSIS — K222 Esophageal obstruction: Secondary | ICD-10-CM | POA: Diagnosis not present

## 2021-09-12 DIAGNOSIS — I10 Essential (primary) hypertension: Secondary | ICD-10-CM

## 2021-09-12 DIAGNOSIS — N1831 Chronic kidney disease, stage 3a: Secondary | ICD-10-CM

## 2021-09-12 DIAGNOSIS — D509 Iron deficiency anemia, unspecified: Secondary | ICD-10-CM | POA: Diagnosis not present

## 2021-09-12 DIAGNOSIS — I5022 Chronic systolic (congestive) heart failure: Secondary | ICD-10-CM | POA: Diagnosis not present

## 2021-09-12 NOTE — Patient Instructions (Addendum)
Continue weighing daily and call for an overnight weight gain of 3 pounds or more or a weekly weight gain of more than 5 pounds.  ° ° ° °Call us in the future if you need us for anything °

## 2021-09-15 ENCOUNTER — Other Ambulatory Visit: Payer: Medicare Other

## 2021-09-17 ENCOUNTER — Other Ambulatory Visit (INDEPENDENT_AMBULATORY_CARE_PROVIDER_SITE_OTHER): Payer: Self-pay | Admitting: Nurse Practitioner

## 2021-09-17 ENCOUNTER — Encounter (INDEPENDENT_AMBULATORY_CARE_PROVIDER_SITE_OTHER): Payer: Self-pay | Admitting: Nurse Practitioner

## 2021-09-17 DIAGNOSIS — I70222 Atherosclerosis of native arteries of extremities with rest pain, left leg: Secondary | ICD-10-CM

## 2021-09-17 NOTE — H&P (View-Only) (Signed)
Subjective:    Patient ID: [name] [name], female    DOB: [birth year], 87 y.o.   MRN: [id number] Chief Complaint  Patient presents with   Follow-up    Ultrasound follow up    [name] is a 87 year old female that returns today due to concern for possible worsening peripheral arterial disease.  The patient also has a previous history of mesenteric stenosis.  The patient has been having left toe pain for approximately last 6 weeks or so with some discoloration.  She was placed on Lyrica sometime in September and this is helped but only minimally.  She also notes that on the left foot there were 2 toes nails that turned black and fell off, and there are some slight open areas where these toenails fell off.  She denies any other wounds or ulcerations.  Today noninvasive studies show noncompressible waveforms on the right with an ABI of 1.06 on the left.  The patient has a TBI 0.32 on the right and 0.13 on the left.  The patient has biphasic tibial artery waveforms on the right with monophasic/biphasic waveforms on the left.  The patient also has good first digit toe waveforms on the right with a nearly completely flat toe waveform on the left.  Noninvasive studies showed dampened waveforms throughout the left lower extremity with slightly dampened throughout the right.   Review of Systems  Skin:  Positive for color change and wound.  All other systems reviewed and are negative.     Objective:   Physical Exam Vitals reviewed.  HENT:     Head: Normocephalic.  Cardiovascular:     Rate and Rhythm: Normal rate and regular rhythm.     Pulses:          Dorsalis pedis pulses are detected w/ Doppler on the right side and detected w/ Doppler on the left side.       Posterior tibial pulses are detected w/ Doppler on the right side and detected w/ Doppler on the left side.  Pulmonary:     Effort: Pulmonary effort is normal.  Skin:    General: Skin is dry.     Capillary Refill: Capillary  refill takes more than 3 seconds.     Coloration: Skin is pale.  Neurological:     Mental Status: She is alert and oriented to person, place, and time.  Psychiatric:        Mood and Affect: Mood normal.        Behavior: Behavior normal.        Thought Content: Thought content normal.        Judgment: Judgment normal.    BP 129/74 (BP Location: Left Arm)    Pulse 72    Resp 16    Wt 117 lb 12.8 oz (53.4 kg)    BMI 20.87 kg/m   Past Medical History:  Diagnosis Date   Anemia    IRON INFUSIONS   Aortic valve sclerosis    Arrhythmia    Arthritis    osteoarthritis   Basal cell carcinoma of skin    Brain tumor (HCC)    Brain tumor (HCC)    Cervical spine disease    CHF (congestive heart failure) (HCC)    Diabetes mellitus without complication (HCC)    Dysrhythmia    sinus arrhythmia   Esophageal stricture    severe, led to feeding tube placement in Sept 2019   Esophageal ulcer without bleeding    Gastrostomy tube  dependent ([place])    DOES NOT EAT OR DRINK    GERD (gastroesophageal reflux disease)    Hearing loss    pt had hearing test  last month, left ear30% hearing due to stroke, 78% in right-normal for her age   History of kidney stones    Hyperlipemia    Hypertension    Kidney stones    Leaky heart valve    Meningioma (HCC)    Occlusive mesenteric ischemia (HCC)    Pulmonary hypertension (HCC)    RLS (restless legs syndrome)    Stroke (HCC)    Vertigo     Social History   Socioeconomic History   Marital status: Widowed    Spouse name: Not on file   Number of children: Not on file   Years of education: Not on file   Highest education level: Not on file  Occupational History   Not on file  Tobacco Use   Smoking status: Never   Smokeless tobacco: Never  Vaping Use   Vaping Use: Never used  Substance and Sexual Activity   Alcohol use: Never   Drug use: Never   Sexual activity: Not Currently  Other Topics Concern   Not on file  Social History Narrative    Lives alone.    Daughter helps her.    Social Determinants of Health   Financial Resource Strain: Not on file  Food Insecurity: Not on file  Transportation Needs: Not on file  Physical Activity: Not on file  Stress: Not on file  Social Connections: Not on file  Intimate Partner Violence: Not on file    Past Surgical History:  Procedure Laterality Date   ABDOMINAL HYSTERECTOMY     APPENDECTOMY     ARTHROGRAM KNEE Left    BACK SURGERY     CERVIVAL NECK FUSION   CATARACT EXTRACTION W/PHACO Left 11/11/2018   Procedure: CATARACT EXTRACTION PHACO AND INTRAOCULAR LENS PLACEMENT ([place]) LEFT, DIABETIC;  Surgeon: Birder Robson, MD;  Location: [facility] ORS;  Service: Ophthalmology;  Laterality: Left;  [place]  00:41 CDE 6.41 Fluid pack lot # [id number]   COLONOSCOPY WITH PROPOFOL N/A 06/20/2017   Procedure: COLONOSCOPY WITH PROPOFOL;  Surgeon: Lollie Sails, MD;  Location: [facility] ENDOSCOPY;  Service: Endoscopy;  Laterality: N/A;   ESOPHAGOGASTRODUODENOSCOPY (EGD) WITH PROPOFOL N/A 03/14/2015   Procedure: ESOPHAGOGASTRODUODENOSCOPY (EGD) WITH PROPOFOL;  Surgeon: Josefine Class, MD;  Location: [facility] ENDOSCOPY;  Service: Endoscopy;  Laterality: N/A;   ESOPHAGOGASTRODUODENOSCOPY (EGD) WITH PROPOFOL N/A 03/28/2017   Procedure: ESOPHAGOGASTRODUODENOSCOPY (EGD) WITH PROPOFOL;  Surgeon: Lollie Sails, MD;  Location: [facility] ENDOSCOPY;  Service: Endoscopy;  Laterality: N/A;   ESOPHAGOGASTRODUODENOSCOPY (EGD) WITH PROPOFOL N/A 06/20/2017   Procedure: ESOPHAGOGASTRODUODENOSCOPY (EGD) WITH PROPOFOL;  Surgeon: Lollie Sails, MD;  Location: [facility] ENDOSCOPY;  Service: Endoscopy;  Laterality: N/A;   ESOPHAGOGASTRODUODENOSCOPY (EGD) WITH PROPOFOL N/A 10/21/2017   Procedure: ESOPHAGOGASTRODUODENOSCOPY (EGD) WITH PROPOFOL;  Surgeon: Lollie Sails, MD;  Location: [facility] ENDOSCOPY;  Service: Endoscopy;  Laterality: N/A;   ESOPHAGOGASTRODUODENOSCOPY (EGD) WITH PROPOFOL N/A 04/15/2018   Procedure:  ESOPHAGOGASTRODUODENOSCOPY (EGD) WITH PROPOFOL;  Surgeon: Lollie Sails, MD;  Location: [facility] ENDOSCOPY;  Service: Endoscopy;  Laterality: N/A;   ESOPHAGUS SURGERY     CLOSURE   EYE SURGERY     HYSTERECTOMY ABDOMINAL WITH SALPINGECTOMY     IR GASTROSTOMY TUBE MOD SED  06/11/2018   IR GASTROSTOMY TUBE REMOVAL  03/25/2019   IR REPLACE G-TUBE SIMPLE WO FLUORO  10/19/2020   PERIPHERAL VASCULAR CATHETERIZATION  N/A 01/23/2016   Procedure: Visceral Venography;  Surgeon: Algernon Huxley, MD;  Location: [place] CV LAB;  Service: Cardiovascular;  Laterality: N/A;   PERIPHERAL VASCULAR CATHETERIZATION  01/23/2016   Procedure: Peripheral Vascular Intervention;  Surgeon: Algernon Huxley, MD;  Location: [place] CV LAB;  Service: Cardiovascular;;   UPPER ESOPHAGEAL ENDOSCOPIC ULTRASOUND (EUS) N/A 12/05/2017   Procedure: UPPER ESOPHAGEAL ENDOSCOPIC ULTRASOUND (EUS);  Surgeon: Jola Schmidt, MD;  Location: [facility] ENDOSCOPY;  Service: Endoscopy;  Laterality: N/A;   VISCERAL ANGIOGRAPHY N/A 03/18/2017   Procedure: Visceral Angiography;  Surgeon: Algernon Huxley, MD;  Location: [place] CV LAB;  Service: Cardiovascular;  Laterality: N/A;   VISCERAL ARTERY INTERVENTION N/A 03/18/2017   Procedure: Visceral Artery Intervention;  Surgeon: Algernon Huxley, MD;  Location: [place] CV LAB;  Service: Cardiovascular;  Laterality: N/A;    Family History  Problem Relation Age of Onset   Stroke Mother    Hypertension Mother    Heart disease Mother    Cancer Father    Heart disease Father    Heart attack Sister    Diabetes Sister    Heart attack Brother    Thyroid cancer Daughter    Cancer - Colon Daughter     Allergies  Allergen Reactions   Elemental Sulfur Diarrhea and Nausea And Vomiting   Gabapentin Swelling   Lipitor [Atorvastatin] Other (See Comments)    Muscle aches   Mevacor [Lovastatin] Other (See Comments)    Muscle aches   Milk-Related Compounds Other (See Comments)    Large quantities cause  headaches    Septra [Sulfamethoxazole-Trimethoprim] Other (See Comments)    Unknown   Statins Other (See Comments)    Muscle pain   Zocor [Simvastatin] Other (See Comments)    Muscle aches    CBC Latest Ref Rng & Units 09/04/2021 08/09/2021 08/07/2021  WBC 4.0 - 10.5 K/uL 7.0 6.8 7.9  Hemoglobin 12.0 - 15.0 g/dL 10.6(L) 7.9(L) 8.8(L)  Hematocrit 36.0 - 46.0 % 34.4(L) 25.5(L) 27.4(L)  Platelets 150 - 400 K/uL 278 352 361      CMP     Component Value Date/Time   NA 140 09/04/2021 1038   K 4.4 09/04/2021 1038   K 4.1 12/23/2014 1008   CL 99 09/04/2021 1038   CO2 30 09/04/2021 1038   GLUCOSE 125 (H) 09/04/2021 1038   BUN 62 (H) 09/04/2021 1038   CREATININE 1.11 (H) 09/04/2021 1038   CREATININE 0.71 01/30/2018 1633   CALCIUM 8.9 09/04/2021 1038   PROT 6.6 05/16/2021 1244   ALBUMIN 3.7 05/16/2021 1244   AST 20 05/16/2021 1244   ALT 14 05/16/2021 1244   ALKPHOS 41 05/16/2021 1244   BILITOT 0.6 05/16/2021 1244   GFRNONAA 47 (L) 09/04/2021 1038   GFRAA 58 (L) 02/04/2020 0915     No results found.     Assessment & Plan:   1. Atherosclerosis of native artery of left lower extremity with rest pain (HCC) Recommend:  The patient has evidence of severe atherosclerotic changes of both lower extremities with rest pain that is associated with preulcerative changes and impending tissue loss of the foot.  This represents a limb threatening ischemia and places the patient at the risk for limb loss.  Patient should undergo angiography of the lower extremities with the hope for intervention for limb salvage.  The risks and benefits as well as the alternative therapies was discussed in detail with the patient.  All questions were answered.  Patient agrees  to proceed with angiography.  The patient will follow up with me in the office after the procedure.       2. Primary hypertension Continue antihypertensive medications as already ordered, these medications have been reviewed and  there are no changes at this time.   3. Type 2 diabetes mellitus with hyperlipidemia ([place]) Continue hypoglycemic medications as already ordered, these medications have been reviewed and there are no changes at this time.  Hgb A1C to be monitored as already arranged by primary service   4. Hyperlipidemia, unspecified hyperlipidemia type Continue statin as ordered and reviewed, no changes at this time    Current Outpatient Medications on File Prior to Visit  Medication Sig Dispense Refill   acetaminophen (TYLENOL) 160 MG/5ML solution Place 20.3 mLs (650 mg total) into feeding tube every 6 (six) hours as needed for mild pain. 120 mL 0   allopurinol (ZYLOPRIM) 100 MG tablet Take by mouth.     aspirin 81 MG chewable tablet by Nasogastric route.     carvedilol (COREG) 3.125 MG tablet Place 1 tablet (3.125 mg total) into feeding tube 2 (two) times daily with a meal. 30 tablet 3   clopidogrel (PLAVIX) 75 MG tablet GIVE 1 TABLET VIA FEEDING TUBE EVERY [name] AS DIRECTED 30 tablet 2   furosemide (LASIX) 20 MG tablet Place 1 tablet (20 mg total) into feeding tube 2 (two) times daily.     glipiZIDE (GLUCOTROL) 5 MG tablet Take 5 mg by mouth every morning.     hydrocerin (EUCERIN) CREA Apply 1 application topically 2 (two) times daily. To bilateral feet  0   lisinopril (ZESTRIL) 2.5 MG tablet Take 1 tablet by mouth daily.     loperamide (IMODIUM) 1 MG/5ML solution Take 1 mg by mouth as needed for diarrhea or loose stools.     metFORMIN (GLUCOPHAGE) 500 MG tablet Place 1 tablet (500 mg total) into feeding tube 2 (two) times daily with a meal. 30 tablet 1   Multiple Vitamin (MULTIVITAMIN) LIQD Place 15 mLs into feeding tube daily. 450 mL 1   Nutritional Supplements (FEEDING SUPPLEMENT, KATE FARMS STANDARD 1.4,) LIQD liquid Place 325 mLs into feeding tube 4 (four) times daily. (Patient taking differently: Place 325 mLs into feeding tube 3 (three) times daily between meals.)     pantoprazole sodium (PROTONIX)  40 mg/20 mL PACK Place 20 mLs (40 mg total) into feeding tube daily. 600 mL 0   pregabalin (LYRICA) 25 MG capsule Take 1 capsule (25 mg total) by mouth 2 (two) times daily. (Patient taking differently: Take 25 mg by mouth daily.)     rOPINIRole (REQUIP) 2 MG tablet Place 1 tablet (2 mg total) into feeding tube 2 (two) times daily. 60 tablet 0   Water For Irrigation, Sterile (FREE WATER) SOLN Place 120 mLs into feeding tube 4 (four) times daily. (Patient taking differently: Place 160 mLs into feeding tube 3 (three) times daily.)     ascorbic acid (VITAMIN C) 250 MG tablet Take 1 tablet by G tube 2 (two) times daily (Patient not taking: Reported on 09/04/2021)     Bromfenac Sodium 0.09 % SOLN Place 1 drop into the left eye 2 (two) times daily. (Patient not taking: Reported on 08/10/2021)     colchicine 0.6 MG tablet Daily for three days the as needed for gout attack (Patient not taking: Reported on 08/10/2021) 10 tablet 0   Hypromellose 0.2 % SOLN Place 1 drop into both eyes 3 (three) times daily as needed (dry  eyes). (Patient not taking: Reported on 09/12/2021)     polyethylene glycol (MIRALAX / GLYCOLAX) 17 g packet Place 17 g into feeding tube daily as needed. (Patient not taking: Reported on 09/04/2021) 14 each 0   No current facility-administered medications on file prior to visit.    There are no Patient Instructions on file for this visit. No follow-ups on file.   Kris Hartmann, NP

## 2021-09-17 NOTE — Progress Notes (Signed)
Subjective:    Patient ID: [name], female    DOB: 07-Apr-1929, 87 y.o.   MRN: [id number] Chief Complaint  Patient presents with   Follow-up    Ultrasound follow up    [name] is a 87 year old female that returns today due to concern for possible worsening peripheral arterial disease.  The patient also has a previous history of mesenteric stenosis.  The patient has been having left toe pain for approximately last 6 weeks or so with some discoloration.  She was placed on Lyrica sometime in September and this is helped but only minimally.  She also notes that on the left foot there were 2 toes nails that turned black and fell off, and there are some slight open areas where these toenails fell off.  She denies any other wounds or ulcerations.  Today noninvasive studies show noncompressible waveforms on the right with an ABI of 1.06 on the left.  The patient has a TBI 0.32 on the right and 0.13 on the left.  The patient has biphasic tibial artery waveforms on the right with monophasic/biphasic waveforms on the left.  The patient also has good first digit toe waveforms on the right with a nearly completely flat toe waveform on the left.  Noninvasive studies showed dampened waveforms throughout the left lower extremity with slightly dampened throughout the right.   Review of Systems  Skin:  Positive for color change and wound.  All other systems reviewed and are negative.     Objective:   Physical Exam Vitals reviewed.  HENT:     Head: Normocephalic.  Cardiovascular:     Rate and Rhythm: Normal rate and regular rhythm.     Pulses:          Dorsalis pedis pulses are detected w/ Doppler on the right side and detected w/ Doppler on the left side.       Posterior tibial pulses are detected w/ Doppler on the right side and detected w/ Doppler on the left side.  Pulmonary:     Effort: Pulmonary effort is normal.  Skin:    General: Skin is dry.     Capillary Refill: Capillary  refill takes more than 3 seconds.     Coloration: Skin is pale.  Neurological:     Mental Status: She is alert and oriented to person, place, and time.  Psychiatric:        Mood and Affect: Mood normal.        Behavior: Behavior normal.        Thought Content: Thought content normal.        Judgment: Judgment normal.    BP 129/74 (BP Location: Left Arm)    Pulse 72    Resp 16    Wt 117 lb 12.8 oz (53.4 kg)    BMI 20.87 kg/m   Past Medical History:  Diagnosis Date   Anemia    IRON INFUSIONS   Aortic valve sclerosis    Arrhythmia    Arthritis    osteoarthritis   Basal cell carcinoma of skin    Brain tumor (HCC)    Brain tumor (HCC)    Cervical spine disease    CHF (congestive heart failure) (HCC)    Diabetes mellitus without complication (HCC)    Dysrhythmia    sinus arrhythmia   Esophageal stricture    severe, led to feeding tube placement in Sept 2019   Esophageal ulcer without bleeding    Gastrostomy tube  dependent ([place])    DOES NOT EAT OR DRINK    GERD (gastroesophageal reflux disease)    Hearing loss    pt had hearing test  last month, left ear30% hearing due to stroke, 78% in right-normal for her age   History of kidney stones    Hyperlipemia    Hypertension    Kidney stones    Leaky heart valve    Meningioma (HCC)    Occlusive mesenteric ischemia (HCC)    Pulmonary hypertension (HCC)    RLS (restless legs syndrome)    Stroke (HCC)    Vertigo     Social History   Socioeconomic History   Marital status: Widowed    Spouse name: Not on file   Number of children: Not on file   Years of education: Not on file   Highest education level: Not on file  Occupational History   Not on file  Tobacco Use   Smoking status: Never   Smokeless tobacco: Never  Vaping Use   Vaping Use: Never used  Substance and Sexual Activity   Alcohol use: Never   Drug use: Never   Sexual activity: Not Currently  Other Topics Concern   Not on file  Social History Narrative    Lives alone.    Daughter helps her.    Social Determinants of Health   Financial Resource Strain: Not on file  Food Insecurity: Not on file  Transportation Needs: Not on file  Physical Activity: Not on file  Stress: Not on file  Social Connections: Not on file  Intimate Partner Violence: Not on file    Past Surgical History:  Procedure Laterality Date   ABDOMINAL HYSTERECTOMY     APPENDECTOMY     ARTHROGRAM KNEE Left    BACK SURGERY     CERVIVAL NECK FUSION   CATARACT EXTRACTION W/PHACO Left 11/11/2018   Procedure: CATARACT EXTRACTION PHACO AND INTRAOCULAR LENS PLACEMENT ([place]) LEFT, DIABETIC;  Surgeon: Birder Robson, MD;  Location: [facility] ORS;  Service: Ophthalmology;  Laterality: Left;  [place]  00:41 CDE 6.41 Fluid pack lot # [id number]   COLONOSCOPY WITH PROPOFOL N/A 06/20/2017   Procedure: COLONOSCOPY WITH PROPOFOL;  Surgeon: Lollie Sails, MD;  Location: [facility] ENDOSCOPY;  Service: Endoscopy;  Laterality: N/A;   ESOPHAGOGASTRODUODENOSCOPY (EGD) WITH PROPOFOL N/A 03/14/2015   Procedure: ESOPHAGOGASTRODUODENOSCOPY (EGD) WITH PROPOFOL;  Surgeon: Josefine Class, MD;  Location: [facility] ENDOSCOPY;  Service: Endoscopy;  Laterality: N/A;   ESOPHAGOGASTRODUODENOSCOPY (EGD) WITH PROPOFOL N/A 03/28/2017   Procedure: ESOPHAGOGASTRODUODENOSCOPY (EGD) WITH PROPOFOL;  Surgeon: Lollie Sails, MD;  Location: [facility] ENDOSCOPY;  Service: Endoscopy;  Laterality: N/A;   ESOPHAGOGASTRODUODENOSCOPY (EGD) WITH PROPOFOL N/A 06/20/2017   Procedure: ESOPHAGOGASTRODUODENOSCOPY (EGD) WITH PROPOFOL;  Surgeon: Lollie Sails, MD;  Location: [facility] ENDOSCOPY;  Service: Endoscopy;  Laterality: N/A;   ESOPHAGOGASTRODUODENOSCOPY (EGD) WITH PROPOFOL N/A 10/21/2017   Procedure: ESOPHAGOGASTRODUODENOSCOPY (EGD) WITH PROPOFOL;  Surgeon: Lollie Sails, MD;  Location: [facility] ENDOSCOPY;  Service: Endoscopy;  Laterality: N/A;   ESOPHAGOGASTRODUODENOSCOPY (EGD) WITH PROPOFOL N/A 04/15/2018   Procedure:  ESOPHAGOGASTRODUODENOSCOPY (EGD) WITH PROPOFOL;  Surgeon: Lollie Sails, MD;  Location: [facility] ENDOSCOPY;  Service: Endoscopy;  Laterality: N/A;   ESOPHAGUS SURGERY     CLOSURE   EYE SURGERY     HYSTERECTOMY ABDOMINAL WITH SALPINGECTOMY     IR GASTROSTOMY TUBE MOD SED  06/11/2018   IR GASTROSTOMY TUBE REMOVAL  03/25/2019   IR REPLACE G-TUBE SIMPLE WO FLUORO  10/19/2020   PERIPHERAL VASCULAR CATHETERIZATION  N/A 01/23/2016   Procedure: Visceral Venography;  Surgeon: Algernon Huxley, MD;  Location: [place] CV LAB;  Service: Cardiovascular;  Laterality: N/A;   PERIPHERAL VASCULAR CATHETERIZATION  01/23/2016   Procedure: Peripheral Vascular Intervention;  Surgeon: Algernon Huxley, MD;  Location: [place] CV LAB;  Service: Cardiovascular;;   UPPER ESOPHAGEAL ENDOSCOPIC ULTRASOUND (EUS) N/A 12/05/2017   Procedure: UPPER ESOPHAGEAL ENDOSCOPIC ULTRASOUND (EUS);  Surgeon: Jola Schmidt, MD;  Location: [facility] ENDOSCOPY;  Service: Endoscopy;  Laterality: N/A;   VISCERAL ANGIOGRAPHY N/A 03/18/2017   Procedure: Visceral Angiography;  Surgeon: Algernon Huxley, MD;  Location: [place] CV LAB;  Service: Cardiovascular;  Laterality: N/A;   VISCERAL ARTERY INTERVENTION N/A 03/18/2017   Procedure: Visceral Artery Intervention;  Surgeon: Algernon Huxley, MD;  Location: [place] CV LAB;  Service: Cardiovascular;  Laterality: N/A;    Family History  Problem Relation Age of Onset   Stroke Mother    Hypertension Mother    Heart disease Mother    Cancer Father    Heart disease Father    Heart attack Sister    Diabetes Sister    Heart attack Brother    Thyroid cancer Daughter    Cancer - Colon Daughter     Allergies  Allergen Reactions   Elemental Sulfur Diarrhea and Nausea And Vomiting   Gabapentin Swelling   Lipitor [Atorvastatin] Other (See Comments)    Muscle aches   Mevacor [Lovastatin] Other (See Comments)    Muscle aches   Milk-Related Compounds Other (See Comments)    Large quantities cause  headaches    Septra [Sulfamethoxazole-Trimethoprim] Other (See Comments)    Unknown   Statins Other (See Comments)    Muscle pain   Zocor [Simvastatin] Other (See Comments)    Muscle aches    CBC Latest Ref Rng & Units 09/04/2021 08/09/2021 08/07/2021  WBC 4.0 - 10.5 K/uL 7.0 6.8 7.9  Hemoglobin 12.0 - 15.0 g/dL 10.6(L) 7.9(L) 8.8(L)  Hematocrit 36.0 - 46.0 % 34.4(L) 25.5(L) 27.4(L)  Platelets 150 - 400 K/uL 278 352 361      CMP     Component Value Date/Time   NA 140 09/04/2021 1038   K 4.4 09/04/2021 1038   K 4.1 12/23/2014 1008   CL 99 09/04/2021 1038   CO2 30 09/04/2021 1038   GLUCOSE 125 (H) 09/04/2021 1038   BUN 62 (H) 09/04/2021 1038   CREATININE 1.11 (H) 09/04/2021 1038   CREATININE 0.71 01/30/2018 1633   CALCIUM 8.9 09/04/2021 1038   PROT 6.6 05/16/2021 1244   ALBUMIN 3.7 05/16/2021 1244   AST 20 05/16/2021 1244   ALT 14 05/16/2021 1244   ALKPHOS 41 05/16/2021 1244   BILITOT 0.6 05/16/2021 1244   GFRNONAA 47 (L) 09/04/2021 1038   GFRAA 58 (L) 02/04/2020 0915     No results found.     Assessment & Plan:   1. Atherosclerosis of native artery of left lower extremity with rest pain (HCC) Recommend:  The patient has evidence of severe atherosclerotic changes of both lower extremities with rest pain that is associated with preulcerative changes and impending tissue loss of the foot.  This represents a limb threatening ischemia and places the patient at the risk for limb loss.  Patient should undergo angiography of the lower extremities with the hope for intervention for limb salvage.  The risks and benefits as well as the alternative therapies was discussed in detail with the patient.  All questions were answered.  Patient agrees  to proceed with angiography.  The patient will follow up with me in the office after the procedure.       2. Primary hypertension Continue antihypertensive medications as already ordered, these medications have been reviewed and  there are no changes at this time.   3. Type 2 diabetes mellitus with hyperlipidemia ([place]) Continue hypoglycemic medications as already ordered, these medications have been reviewed and there are no changes at this time.  Hgb A1C to be monitored as already arranged by primary service   4. Hyperlipidemia, unspecified hyperlipidemia type Continue statin as ordered and reviewed, no changes at this time    Current Outpatient Medications on File Prior to Visit  Medication Sig Dispense Refill   acetaminophen (TYLENOL) 160 MG/5ML solution Place 20.3 mLs (650 mg total) into feeding tube every 6 (six) hours as needed for mild pain. 120 mL 0   allopurinol (ZYLOPRIM) 100 MG tablet Take by mouth.     aspirin 81 MG chewable tablet by Nasogastric route.     carvedilol (COREG) 3.125 MG tablet Place 1 tablet (3.125 mg total) into feeding tube 2 (two) times daily with a meal. 30 tablet 3   clopidogrel (PLAVIX) 75 MG tablet GIVE 1 TABLET VIA FEEDING TUBE EVERY [name] AS DIRECTED 30 tablet 2   furosemide (LASIX) 20 MG tablet Place 1 tablet (20 mg total) into feeding tube 2 (two) times daily.     glipiZIDE (GLUCOTROL) 5 MG tablet Take 5 mg by mouth every morning.     hydrocerin (EUCERIN) CREA Apply 1 application topically 2 (two) times daily. To bilateral feet  0   lisinopril (ZESTRIL) 2.5 MG tablet Take 1 tablet by mouth daily.     loperamide (IMODIUM) 1 MG/5ML solution Take 1 mg by mouth as needed for diarrhea or loose stools.     metFORMIN (GLUCOPHAGE) 500 MG tablet Place 1 tablet (500 mg total) into feeding tube 2 (two) times daily with a meal. 30 tablet 1   Multiple Vitamin (MULTIVITAMIN) LIQD Place 15 mLs into feeding tube daily. 450 mL 1   Nutritional Supplements (FEEDING SUPPLEMENT, KATE FARMS STANDARD 1.4,) LIQD liquid Place 325 mLs into feeding tube 4 (four) times daily. (Patient taking differently: Place 325 mLs into feeding tube 3 (three) times daily between meals.)     pantoprazole sodium (PROTONIX)  40 mg/20 mL PACK Place 20 mLs (40 mg total) into feeding tube daily. 600 mL 0   pregabalin (LYRICA) 25 MG capsule Take 1 capsule (25 mg total) by mouth 2 (two) times daily. (Patient taking differently: Take 25 mg by mouth daily.)     rOPINIRole (REQUIP) 2 MG tablet Place 1 tablet (2 mg total) into feeding tube 2 (two) times daily. 60 tablet 0   Water For Irrigation, Sterile (FREE WATER) SOLN Place 120 mLs into feeding tube 4 (four) times daily. (Patient taking differently: Place 160 mLs into feeding tube 3 (three) times daily.)     ascorbic acid (VITAMIN C) 250 MG tablet Take 1 tablet by G tube 2 (two) times daily (Patient not taking: Reported on 09/04/2021)     Bromfenac Sodium 0.09 % SOLN Place 1 drop into the left eye 2 (two) times daily. (Patient not taking: Reported on 08/10/2021)     colchicine 0.6 MG tablet Daily for three days the as needed for gout attack (Patient not taking: Reported on 08/10/2021) 10 tablet 0   Hypromellose 0.2 % SOLN Place 1 drop into both eyes 3 (three) times daily as needed (dry  eyes). (Patient not taking: Reported on 09/12/2021)     polyethylene glycol (MIRALAX / GLYCOLAX) 17 g packet Place 17 g into feeding tube daily as needed. (Patient not taking: Reported on 09/04/2021) 14 each 0   No current facility-administered medications on file prior to visit.    There are no Patient Instructions on file for this visit. No follow-ups on file.   Kris Hartmann, NP

## 2021-09-18 ENCOUNTER — Ambulatory Visit
Admission: RE | Admit: 2021-09-18 | Discharge: 2021-09-18 | Disposition: A | Discharge status: Home / Self Care | Payer: Medicare Other | Source: Home / Self Care | Attending: Vascular Surgery | Admitting: Vascular Surgery

## 2021-09-18 ENCOUNTER — Encounter
Admission: RE | Disposition: A | Discharge status: Home / Self Care | Payer: Self-pay | Source: Home / Self Care | Attending: Vascular Surgery

## 2021-09-18 DIAGNOSIS — R0602 Shortness of breath: Secondary | ICD-10-CM | POA: Diagnosis not present

## 2021-09-18 DIAGNOSIS — E1151 Type 2 diabetes mellitus with diabetic peripheral angiopathy without gangrene: Secondary | ICD-10-CM | POA: Insufficient documentation

## 2021-09-18 DIAGNOSIS — E785 Hyperlipidemia, unspecified: Secondary | ICD-10-CM | POA: Insufficient documentation

## 2021-09-18 DIAGNOSIS — I10 Essential (primary) hypertension: Secondary | ICD-10-CM | POA: Insufficient documentation

## 2021-09-18 DIAGNOSIS — I70222 Atherosclerosis of native arteries of extremities with rest pain, left leg: Secondary | ICD-10-CM

## 2021-09-18 DIAGNOSIS — I130 Hypertensive heart and chronic kidney disease with heart failure and stage 1 through stage 4 chronic kidney disease, or unspecified chronic kidney disease: Secondary | ICD-10-CM | POA: Diagnosis not present

## 2021-09-18 DIAGNOSIS — I70229 Atherosclerosis of native arteries of extremities with rest pain, unspecified extremity: Secondary | ICD-10-CM

## 2021-09-18 HISTORY — PX: LOWER EXTREMITY ANGIOGRAPHY: CATH118251

## 2021-09-18 LAB — CREATININE, SERUM
Creatinine, Ser: 1.12 mg/dL — ABNORMAL HIGH (ref 0.44–1.00)
GFR, Estimated: 47 mL/min — ABNORMAL LOW (ref >60)

## 2021-09-18 LAB — GLUCOSE, CAPILLARY
Glucose-Capillary: 174 mg/dL — ABNORMAL HIGH (ref 70–99)
Glucose-Capillary: 146 mg/dL — ABNORMAL HIGH (ref 70–99)

## 2021-09-18 LAB — BUN: BUN: 64 mg/dL — ABNORMAL HIGH (ref 8–23)

## 2021-09-18 SURGERY — LOWER EXTREMITY ANGIOGRAPHY
Anesthesia: Moderate Sedation | Site: Leg Lower | Laterality: Left

## 2021-09-18 MED ORDER — SODIUM CHLORIDE 0.9% FLUSH: 3.0000 mL | INTRAVENOUS | Status: DC | PRN

## 2021-09-18 MED ORDER — FAMOTIDINE 20 MG PO TABS: 40.0000 mg | ORAL_TABLET | Freq: Once | ORAL | Status: DC | PRN

## 2021-09-18 MED ORDER — MIDAZOLAM HCL 2 MG/2ML IJ SOLN
INTRAMUSCULAR | Status: DC | PRN
  Administered 2021-09-18: 1 mg via INTRAVENOUS
  Administered 2021-09-18: .5 mg via INTRAVENOUS

## 2021-09-18 MED ORDER — SODIUM CHLORIDE 0.9% FLUSH: 3.0000 mL | Freq: Two times a day (BID) | INTRAVENOUS | Status: DC

## 2021-09-18 MED ORDER — ONDANSETRON HCL 4 MG/2ML IJ SOLN: 4.0000 mg | Freq: Four times a day (QID) | INTRAMUSCULAR | Status: DC | PRN

## 2021-09-18 MED ORDER — HEPARIN SODIUM (PORCINE) 1000 UNIT/ML IJ SOLN
INTRAMUSCULAR | Status: AC
  Filled 2021-09-18: qty 10

## 2021-09-18 MED ORDER — SODIUM CHLORIDE 0.9 % IV SOLN: 250.0000 mL | INTRAVENOUS | Status: DC | PRN

## 2021-09-18 MED ORDER — MIDAZOLAM HCL 2 MG/ML PO SYRP: 8.0000 mg | ORAL_SOLUTION | Freq: Once | ORAL | Status: DC | PRN

## 2021-09-18 MED ORDER — FENTANYL CITRATE (PF) 100 MCG/2ML IJ SOLN
INTRAMUSCULAR | Status: DC | PRN
  Administered 2021-09-18: 25 ug via INTRAVENOUS
  Administered 2021-09-18: 12.5 ug via INTRAVENOUS

## 2021-09-18 MED ORDER — HYDROMORPHONE HCL 1 MG/ML IJ SOLN: 1.0000 mg | Freq: Once | INTRAMUSCULAR | Status: DC | PRN

## 2021-09-18 MED ORDER — ACETAMINOPHEN 160 MG/5ML PO SOLN
650.0000 mg | Freq: Four times a day (QID) | ORAL | Status: AC | PRN
  Administered 2021-09-18: 14:00:00 650 mg
  Filled 2021-09-18: qty 20.3

## 2021-09-18 MED ORDER — ONDANSETRON HCL 4 MG/2ML IJ SOLN
INTRAMUSCULAR | Status: AC
  Filled 2021-09-18: qty 2

## 2021-09-18 MED ORDER — HYDRALAZINE HCL 20 MG/ML IJ SOLN: 5.0000 mg | INTRAMUSCULAR | Status: DC | PRN

## 2021-09-18 MED ORDER — CEFAZOLIN SODIUM-DEXTROSE 2-4 GM/100ML-% IV SOLN
2.0000 g | Freq: Once | INTRAVENOUS | Status: AC
  Administered 2021-09-18: 12:00:00 2 g via INTRAVENOUS

## 2021-09-18 MED ORDER — HEPARIN SODIUM (PORCINE) 1000 UNIT/ML IJ SOLN
INTRAMUSCULAR | Status: DC | PRN
  Administered 2021-09-18: 4000 [IU] via INTRAVENOUS

## 2021-09-18 MED ORDER — DIPHENHYDRAMINE HCL 50 MG/ML IJ SOLN: 50.0000 mg | Freq: Once | INTRAMUSCULAR | Status: DC | PRN

## 2021-09-18 MED ORDER — ACETAMINOPHEN 325 MG PO TABS: 650.0000 mg | ORAL_TABLET | ORAL | Status: DC | PRN

## 2021-09-18 MED ORDER — METHYLPREDNISOLONE SODIUM SUCC 125 MG IJ SOLR: 125.0000 mg | Freq: Once | INTRAMUSCULAR | Status: DC | PRN

## 2021-09-18 MED ORDER — MIDAZOLAM HCL 2 MG/2ML IJ SOLN
INTRAMUSCULAR | Status: AC
  Filled 2021-09-18: qty 2

## 2021-09-18 MED ORDER — FENTANYL CITRATE PF 50 MCG/ML IJ SOSY
PREFILLED_SYRINGE | INTRAMUSCULAR | Status: AC
  Filled 2021-09-18: qty 1

## 2021-09-18 MED ORDER — LABETALOL HCL 5 MG/ML IV SOLN: 10.0000 mg | INTRAVENOUS | Status: DC | PRN

## 2021-09-18 MED ORDER — SODIUM CHLORIDE 0.9 % IV SOLN: INTRAVENOUS | Status: DC

## 2021-09-18 SURGICAL SUPPLY — 22 items
BALLN LUTONIX 018 5X220X130 (BALLOONS) ×3
BALLN ULTRVRSE 2.5X300X150 (BALLOONS) ×3
BALLN ULTRVRSE 2X150X150 (BALLOONS) ×3
BALLN ULTRVRSE 2X150X150 OTW (BALLOONS) ×1
BALLN ULTRVRSE 3X150X150 (BALLOONS) ×3
BALLOON LUTONIX 018 5X220X130 (BALLOONS) IMPLANT
BALLOON ULTRVRSE 2.5X300X150 (BALLOONS) IMPLANT
BALLOON ULTRVRSE 2X150X150 OTW (BALLOONS) IMPLANT
BALLOON ULTRVRSE 3X150X150 (BALLOONS) IMPLANT
CATH ANGIO 5F PIGTAIL 65CM (CATHETERS) ×2 IMPLANT
CATH VERT 5X100 (CATHETERS) ×2 IMPLANT
COVER PROBE U/S 5X48 (MISCELLANEOUS) ×2 IMPLANT
DEVICE STARCLOSE SE CLOSURE (Vascular Products) ×2 IMPLANT
GLIDEWIRE ADV .035X260CM (WIRE) ×2 IMPLANT
KIT ENCORE 26 ADVANTAGE (KITS) ×2 IMPLANT
PACK ANGIOGRAPHY (CUSTOM PROCEDURE TRAY) ×3 IMPLANT
SHEATH BRITE TIP 55CM 6FR (SHEATH) ×2 IMPLANT
SHEATH PINNACLE 5F 10CM (SHEATH) ×2 IMPLANT
SYR MEDRAD MARK 7 150ML (SYRINGE) ×2 IMPLANT
TUBING CONTRAST HIGH PRESS 72 (TUBING) ×2 IMPLANT
WIRE G V18X300CM (WIRE) ×2 IMPLANT
WIRE GUIDERIGHT .035X150 (WIRE) ×2 IMPLANT

## 2021-09-18 NOTE — Interval H&P Note (Signed)
History and Physical Interval Note:  09/18/2021 9:16 AM  [name]  has presented today for surgery, with the diagnosis of LLE Angio   BARD    ASO w rest pain.  The various methods of treatment have been discussed with the patient and family. After consideration of risks, benefits and other options for treatment, the patient has consented to  Procedure(s): LOWER EXTREMITY ANGIOGRAPHY (Left) as a surgical intervention.  The patient's history has been reviewed, patient examined, no change in status, stable for surgery.  I have reviewed the patient's chart and labs.  Questions were answered to the patient's satisfaction.     [name]

## 2021-09-18 NOTE — Progress Notes (Signed)
Dr. Lucky Cowboy came by & spoke with pt. Daughter re: procedural results. Daughter [name] verbalized understanding of conversation.

## 2021-09-18 NOTE — Progress Notes (Signed)
Pt. States she has no more nausea and her HA is gone now. Right groin without any complications. DC instructions reviewed with pt. And her daughter with verbalized understanding.

## 2021-09-18 NOTE — Op Note (Signed)
[facility]  Percutaneous Study/Intervention Procedural Note   Date of Surgery: 09/18/2021  Surgeon(s):Lopez Dentinger    Assistants:none  Pre-operative Diagnosis: PAD with rest pain LLE  Post-operative diagnosis:  Same  Procedure(s) Performed:             1.  Ultrasound guidance for vascular access right femoral artery             2.  Catheter placement into left femoral artery from right femoral approach             3.  Aortogram and selective left lower extremity angiogram             4.  Percutaneous transluminal angioplasty of left anterior tibial artery with 2.5 mm diameter by 30 cm length angioplasty balloon             5.  Percutaneous transluminal angioplasty of left distal SFA and popliteal artery with 5 mm diameter by 22 cm length Lutonix drug-coated angioplasty balloon  6.  Percutaneous transluminal angioplasty of left peroneal artery with 2 mm diameter angioplasty balloon distally and 3 mm diameter angioplasty balloon in the proximal segment and tibioperoneal trunk             7.  StarClose closure device right femoral artery  EBL: 5 cc  Contrast: 50 cc  Fluoro Time: 5.7 minutes  Moderate Conscious Sedation Time: approximately 31 minutes using 1.5 mg of Versed and 37.5 mcg of Fentanyl              Indications:  Patient is a 87 y.o.female with rest pain in both feet and now with loss of toenails and chronic wound. The patient has noninvasive study showing nearly flat digital pressures on the left. The patient is brought in for angiography for further evaluation and potential treatment.  Due to the limb threatening nature of the situation, angiogram was performed for attempted limb salvage. The patient is aware that if the procedure fails, amputation would be expected.  The patient also understands that even with successful revascularization, amputation may still be required due to the severity of the situation.  Risks and benefits are discussed and  informed consent is obtained.   Procedure:  The patient was identified and appropriate procedural time out was performed.  The patient was then placed supine on the table and prepped and draped in the usual sterile fashion. Moderate conscious sedation was administered during a face to face encounter with the patient throughout the procedure with my supervision of the RN administering medicines and monitoring the patient's vital signs, pulse oximetry, telemetry and mental status throughout from the start of the procedure until the patient was taken to the recovery room. Ultrasound was used to evaluate the right common femoral artery.  It was patent .  A digital ultrasound image was acquired.  A Seldinger needle was used to access the right common femoral artery under direct ultrasound guidance and a permanent image was performed.  A 0.035 J wire was advanced without resistance and a 5Fr sheath was placed.  Pigtail catheter was placed into the aorta and an AP aortogram was performed. This demonstrated normal renal arteries and normal aorta and iliac segments without significant stenosis with the exception of a small saccular infrarenal AAA. I then crossed the aortic bifurcation and advanced to the left femoral head. Selective left lower extremity angiogram was then performed. This demonstrated normal common femoral artery, profunda femoris artery, and proximal and mid superficial femoral artery.  In the distal SFA and above-knee popliteal artery was moderate stenosis with several lesions in the 50 to 70% range over about a 15 to 20 cm span.  The below-knee popliteal artery normalized.  The anterior tibial and peroneal arteries were the only runoff distally and both had severe disease with areas of occlusion and multiple areas of significant stenosis. It was felt that it was in the patient's best interest to proceed with intervention after these images to avoid a second procedure and a larger amount of contrast and  fluoroscopy based off of the findings from the initial angiogram. The patient was systemically heparinized and a 6 French 55 cm sheath was then placed over the Terumo Advantage wire. I then used a Kumpe catheter and the advantage wire to navigate across the SFA and popliteal stenosis and into the anterior tibial artery.  I then exchanged for a V 18 wire which crossed into the foot.  A 2.5 mm diameter by 30 cm length angioplasty balloon was taken down to the distal anterior tibial artery and up across the origin of the anterior tibial artery.  There was a very calcific lesion of the distal anterior tibial artery that was not completely occluded but the balloon would not cross.  I inflated the balloon to 10 atm to this location with marked improvement in the areas of occlusion above and the multiple areas of stenosis with less than 30% residual stenosis in the areas treated.  Even downsizing to a 2 mm balloon was not able to cross this area in the anterior tibial artery distally just above the ankle.  A 5 mm diameter by 22 cm length Lutonix drug-coated angioplasty balloon was then inflated to 8 atm in the distal SFA and above-knee popliteal artery.  Completion imaging following this showed only about a 10 to 15% residual stenosis in the SFA and popliteal arteries.  I then turned my attention to the peroneal artery.  Using the V 18 wire was able to parked this down at the level of the ankle.  In the distal peroneal artery a 2 mm diameter angioplasty balloon was inflated up to the mid to proximal peroneal artery.  For the proximal peroneal artery and TP trunk a 3 mm diameter by 15 cm length angioplasty balloon was inflated to 6 atm for 1 minute.  Completion imaging showed less than 30% residual stenosis in the peroneal artery and tibioperoneal trunk.  The anterior tibial arteries was also markedly improved down to the lesion that I could not cross the balloon.  There was significantly improved flow.  I elected to  terminate the procedure. The sheath was removed and StarClose closure device was deployed in the right femoral artery with excellent hemostatic result. The patient was taken to the recovery room in stable condition having tolerated the procedure well.  Findings:               Aortogram:  This demonstrated normal renal arteries and normal aorta and iliac segments without significant stenosis with the exception of a small saccular infrarenal AAA.             Left Lower Extremity: Normal common femoral artery, profunda femoris artery, and proximal and mid superficial femoral artery.  In the distal SFA and above-knee popliteal artery was moderate stenosis with several lesions in the 50 to 70% range over about a 15 to 20 cm span.  The below-knee popliteal artery normalized.  The anterior tibial and peroneal arteries were the only runoff  distally and both had severe disease with areas of occlusion and multiple areas of significant stenosis.   Disposition: Patient was taken to the recovery room in stable condition having tolerated the procedure well.  Complications: None  [name] 09/18/2021 12:43 PM   This note was created with Dragon Medical transcription system. Any errors in dictation are purely unintentional.

## 2021-09-19 ENCOUNTER — Encounter: Payer: Self-pay | Admitting: Vascular Surgery

## 2021-09-21 ENCOUNTER — Emergency Department: Payer: Medicare Other

## 2021-09-21 ENCOUNTER — Inpatient Hospital Stay
Admission: EM | Admit: 2021-09-21 | Discharge: 2021-09-24 | DRG: 252 | Disposition: A | Discharge status: Home / Self Care | Payer: Medicare Other | Attending: Internal Medicine | Admitting: Internal Medicine

## 2021-09-21 ENCOUNTER — Other Ambulatory Visit: Payer: Self-pay

## 2021-09-21 ENCOUNTER — Encounter: Payer: Self-pay | Admitting: Emergency Medicine

## 2021-09-21 DIAGNOSIS — I248 Other forms of acute ischemic heart disease: Secondary | ICD-10-CM | POA: Diagnosis present

## 2021-09-21 DIAGNOSIS — Z79899 Other long term (current) drug therapy: Secondary | ICD-10-CM

## 2021-09-21 DIAGNOSIS — E875 Hyperkalemia: Secondary | ICD-10-CM | POA: Diagnosis present

## 2021-09-21 DIAGNOSIS — I5043 Acute on chronic combined systolic (congestive) and diastolic (congestive) heart failure: Secondary | ICD-10-CM | POA: Diagnosis present

## 2021-09-21 DIAGNOSIS — Z888 Allergy status to other drugs, medicaments and biological substances status: Secondary | ICD-10-CM

## 2021-09-21 DIAGNOSIS — I429 Cardiomyopathy, unspecified: Secondary | ICD-10-CM | POA: Diagnosis present

## 2021-09-21 DIAGNOSIS — H9190 Unspecified hearing loss, unspecified ear: Secondary | ICD-10-CM | POA: Diagnosis present

## 2021-09-21 DIAGNOSIS — E1122 Type 2 diabetes mellitus with diabetic chronic kidney disease: Secondary | ICD-10-CM | POA: Diagnosis present

## 2021-09-21 DIAGNOSIS — R0602 Shortness of breath: Secondary | ICD-10-CM

## 2021-09-21 DIAGNOSIS — Z981 Arthrodesis status: Secondary | ICD-10-CM

## 2021-09-21 DIAGNOSIS — N1831 Chronic kidney disease, stage 3a: Secondary | ICD-10-CM | POA: Diagnosis present

## 2021-09-21 DIAGNOSIS — Z85828 Personal history of other malignant neoplasm of skin: Secondary | ICD-10-CM

## 2021-09-21 DIAGNOSIS — Z931 Gastrostomy status: Secondary | ICD-10-CM

## 2021-09-21 DIAGNOSIS — G2581 Restless legs syndrome: Secondary | ICD-10-CM | POA: Diagnosis present

## 2021-09-21 DIAGNOSIS — I5023 Acute on chronic systolic (congestive) heart failure: Secondary | ICD-10-CM

## 2021-09-21 DIAGNOSIS — E1151 Type 2 diabetes mellitus with diabetic peripheral angiopathy without gangrene: Secondary | ICD-10-CM | POA: Diagnosis present

## 2021-09-21 DIAGNOSIS — Z20822 Contact with and (suspected) exposure to covid-19: Secondary | ICD-10-CM | POA: Diagnosis present

## 2021-09-21 DIAGNOSIS — Z8719 Personal history of other diseases of the digestive system: Secondary | ICD-10-CM

## 2021-09-21 DIAGNOSIS — Z808 Family history of malignant neoplasm of other organs or systems: Secondary | ICD-10-CM

## 2021-09-21 DIAGNOSIS — Z66 Do not resuscitate: Secondary | ICD-10-CM | POA: Diagnosis present

## 2021-09-21 DIAGNOSIS — E1169 Type 2 diabetes mellitus with other specified complication: Secondary | ICD-10-CM | POA: Diagnosis present

## 2021-09-21 DIAGNOSIS — K219 Gastro-esophageal reflux disease without esophagitis: Secondary | ICD-10-CM | POA: Diagnosis present

## 2021-09-21 DIAGNOSIS — I272 Pulmonary hypertension, unspecified: Secondary | ICD-10-CM | POA: Diagnosis present

## 2021-09-21 DIAGNOSIS — Z7902 Long term (current) use of antithrombotics/antiplatelets: Secondary | ICD-10-CM

## 2021-09-21 DIAGNOSIS — Z7982 Long term (current) use of aspirin: Secondary | ICD-10-CM

## 2021-09-21 DIAGNOSIS — I69351 Hemiplegia and hemiparesis following cerebral infarction affecting right dominant side: Secondary | ICD-10-CM

## 2021-09-21 DIAGNOSIS — E785 Hyperlipidemia, unspecified: Secondary | ICD-10-CM | POA: Diagnosis present

## 2021-09-21 DIAGNOSIS — D631 Anemia in chronic kidney disease: Secondary | ICD-10-CM | POA: Diagnosis present

## 2021-09-21 DIAGNOSIS — Z86011 Personal history of benign neoplasm of the brain: Secondary | ICD-10-CM

## 2021-09-21 DIAGNOSIS — Z7984 Long term (current) use of oral hypoglycemic drugs: Secondary | ICD-10-CM

## 2021-09-21 DIAGNOSIS — E119 Type 2 diabetes mellitus without complications: Secondary | ICD-10-CM

## 2021-09-21 DIAGNOSIS — I70222 Atherosclerosis of native arteries of extremities with rest pain, left leg: Secondary | ICD-10-CM | POA: Diagnosis present

## 2021-09-21 DIAGNOSIS — Z833 Family history of diabetes mellitus: Secondary | ICD-10-CM

## 2021-09-21 DIAGNOSIS — K222 Esophageal obstruction: Secondary | ICD-10-CM | POA: Diagnosis present

## 2021-09-21 DIAGNOSIS — Z882 Allergy status to sulfonamides status: Secondary | ICD-10-CM

## 2021-09-21 DIAGNOSIS — I10 Essential (primary) hypertension: Secondary | ICD-10-CM | POA: Diagnosis present

## 2021-09-21 DIAGNOSIS — I350 Nonrheumatic aortic (valve) stenosis: Secondary | ICD-10-CM | POA: Diagnosis present

## 2021-09-21 DIAGNOSIS — I509 Heart failure, unspecified: Secondary | ICD-10-CM

## 2021-09-21 DIAGNOSIS — Z823 Family history of stroke: Secondary | ICD-10-CM

## 2021-09-21 DIAGNOSIS — J9601 Acute respiratory failure with hypoxia: Secondary | ICD-10-CM | POA: Diagnosis present

## 2021-09-21 DIAGNOSIS — I130 Hypertensive heart and chronic kidney disease with heart failure and stage 1 through stage 4 chronic kidney disease, or unspecified chronic kidney disease: Secondary | ICD-10-CM | POA: Diagnosis present

## 2021-09-21 DIAGNOSIS — N179 Acute kidney failure, unspecified: Secondary | ICD-10-CM | POA: Diagnosis present

## 2021-09-21 DIAGNOSIS — R778 Other specified abnormalities of plasma proteins: Secondary | ICD-10-CM

## 2021-09-21 DIAGNOSIS — M159 Polyosteoarthritis, unspecified: Secondary | ICD-10-CM | POA: Diagnosis present

## 2021-09-21 DIAGNOSIS — Z8249 Family history of ischemic heart disease and other diseases of the circulatory system: Secondary | ICD-10-CM

## 2021-09-21 LAB — CBC WITH DIFFERENTIAL/PLATELET
Abs Immature Granulocytes: 0.05 10*3/uL (ref 0.00–0.07)
Basophils Absolute: 0.1 10*3/uL (ref 0.0–0.1)
Basophils Relative: 1 %
Eosinophils Absolute: 0.7 10*3/uL — ABNORMAL HIGH (ref 0.0–0.5)
Eosinophils Relative: 10 %
HCT: 39.6 % (ref 36.0–46.0)
Hemoglobin: 12.2 g/dL (ref 12.0–15.0)
Immature Granulocytes: 1 %
Lymphocytes Relative: 16 %
Lymphs Abs: 1.2 10*3/uL (ref 0.7–4.0)
MCH: 29 pg (ref 26.0–34.0)
MCHC: 30.8 g/dL (ref 30.0–36.0)
MCV: 94.1 fL (ref 80.0–100.0)
Monocytes Absolute: 0.4 10*3/uL (ref 0.1–1.0)
Monocytes Relative: 5 %
Neutro Abs: 4.9 10*3/uL (ref 1.7–7.7)
Neutrophils Relative %: 67 %
Platelets: 269 10*3/uL (ref 150–400)
RBC: 4.21 MIL/uL (ref 3.87–5.11)
RDW: 15.3 % (ref 11.5–15.5)
WBC: 7.3 10*3/uL (ref 4.0–10.5)
nRBC: 0 % (ref 0.0–0.2)

## 2021-09-21 LAB — BASIC METABOLIC PANEL
Anion gap: 8 (ref 5–15)
BUN: 70 mg/dL — ABNORMAL HIGH (ref 8–23)
CO2: 25 mmol/L (ref 22–32)
Calcium: 7.8 mg/dL — ABNORMAL LOW (ref 8.9–10.3)
Chloride: 105 mmol/L (ref 98–111)
Creatinine, Ser: 1.19 mg/dL — ABNORMAL HIGH (ref 0.44–1.00)
GFR, Estimated: 43 mL/min — ABNORMAL LOW (ref >60)
Glucose, Bld: 253 mg/dL — ABNORMAL HIGH (ref 70–99)
Potassium: 6.1 mmol/L — ABNORMAL HIGH (ref 3.5–5.1)
Sodium: 138 mmol/L (ref 135–145)

## 2021-09-21 LAB — RESP PANEL BY RT-PCR (FLU A&B, COVID) ARPGX2
Influenza A by PCR: NEGATIVE
Influenza B by PCR: NEGATIVE
SARS Coronavirus 2 by RT PCR: NEGATIVE

## 2021-09-21 LAB — TROPONIN I (HIGH SENSITIVITY): Troponin I (High Sensitivity): 67 ng/L — ABNORMAL HIGH (ref <18)

## 2021-09-21 LAB — BRAIN NATRIURETIC PEPTIDE: B Natriuretic Peptide: 1594.8 pg/mL — ABNORMAL HIGH (ref 0.0–100.0)

## 2021-09-21 MED ORDER — CARVEDILOL 3.125 MG PO TABS
3.1250 mg | ORAL_TABLET | Freq: Two times a day (BID) | ORAL | Status: DC
  Administered 2021-09-23 – 2021-09-24 (×3): 3.125 mg
  Filled 2021-09-21 (×3): qty 1

## 2021-09-21 MED ORDER — ENOXAPARIN SODIUM 30 MG/0.3ML IJ SOSY
30.0000 mg | PREFILLED_SYRINGE | INTRAMUSCULAR | Status: DC
  Administered 2021-09-22 – 2021-09-24 (×3): 30 mg via SUBCUTANEOUS
  Filled 2021-09-21 (×3): qty 0.3

## 2021-09-21 MED ORDER — ONDANSETRON HCL 4 MG/2ML IJ SOLN: 4.0000 mg | Freq: Four times a day (QID) | INTRAMUSCULAR | Status: DC | PRN

## 2021-09-21 MED ORDER — SODIUM CHLORIDE 0.9% FLUSH: 3.0000 mL | INTRAVENOUS | Status: DC | PRN

## 2021-09-21 MED ORDER — LISINOPRIL 5 MG PO TABS
2.5000 mg | ORAL_TABLET | Freq: Every day | ORAL | Status: DC
  Administered 2021-09-22 – 2021-09-24 (×3): 2.5 mg via ORAL
  Filled 2021-09-21 (×3): qty 1

## 2021-09-21 MED ORDER — SODIUM CHLORIDE 0.9% FLUSH
3.0000 mL | Freq: Two times a day (BID) | INTRAVENOUS | Status: DC
  Administered 2021-09-21 – 2021-09-23 (×5): 3 mL via INTRAVENOUS

## 2021-09-21 MED ORDER — OSMOLITE 1.2 CAL PO LIQD: 1000.0000 mL | ORAL | Status: DC

## 2021-09-21 MED ORDER — FUROSEMIDE 10 MG/ML IJ SOLN
40.0000 mg | Freq: Once | INTRAMUSCULAR | Status: AC
  Administered 2021-09-21: 23:00:00 40 mg via INTRAVENOUS
  Filled 2021-09-21: qty 4

## 2021-09-21 MED ORDER — ACETAMINOPHEN 325 MG PO TABS
650.0000 mg | ORAL_TABLET | ORAL | Status: DC | PRN
  Administered 2021-09-22 (×3): 650 mg via ORAL
  Filled 2021-09-21 (×3): qty 2

## 2021-09-21 MED ORDER — CALCIUM GLUCONATE-NACL 1-0.675 GM/50ML-% IV SOLN
1.0000 g | Freq: Once | INTRAVENOUS | Status: AC
  Administered 2021-09-21: 23:00:00 1000 mg via INTRAVENOUS
  Filled 2021-09-21: qty 50

## 2021-09-21 MED ORDER — INSULIN ASPART 100 UNIT/ML IJ SOLN
0.0000 [IU] | INTRAMUSCULAR | Status: DC
  Administered 2021-09-22: 22:00:00 2 [IU] via SUBCUTANEOUS
  Administered 2021-09-22: 5 [IU] via SUBCUTANEOUS
  Administered 2021-09-22 – 2021-09-23 (×2): 2 [IU] via SUBCUTANEOUS
  Administered 2021-09-23: 19:00:00 5 [IU] via SUBCUTANEOUS
  Administered 2021-09-23: 20:00:00 3 [IU] via SUBCUTANEOUS
  Administered 2021-09-24 (×2): 2 [IU] via SUBCUTANEOUS
  Filled 2021-09-21 (×8): qty 1

## 2021-09-21 MED ORDER — ASPIRIN 81 MG PO CHEW
324.0000 mg | CHEWABLE_TABLET | Freq: Once | ORAL | Status: DC
  Filled 2021-09-21: qty 4

## 2021-09-21 MED ORDER — SODIUM CHLORIDE 0.9 % IV SOLN: 250.0000 mL | INTRAVENOUS | Status: DC | PRN

## 2021-09-21 MED ORDER — FUROSEMIDE 10 MG/ML IJ SOLN
40.0000 mg | Freq: Two times a day (BID) | INTRAMUSCULAR | Status: DC
  Administered 2021-09-22 – 2021-09-23 (×3): 40 mg via INTRAVENOUS
  Filled 2021-09-21 (×3): qty 4

## 2021-09-21 NOTE — ED Triage Notes (Signed)
Pt to ED via [facility] from home c/o SOB that started today. Pt has hx of CHF. PTA pt O2 was 86% RA,EMS put pt on 8L NRB O2 at 100%. Pt also c/o CP.  Per EMS pt family gave extra dose of Lasix.  Pt is A&Ox4 Pt has feeding tube.

## 2021-09-21 NOTE — ED Provider Notes (Signed)
[facility] Emergency Department Provider Note    ____________________________________________   I have reviewed the triage vital signs and the nursing notes.   HISTORY  Chief Complaint Shortness of breath   History limited by: Not Limited   HPI [name] is a 87 y.o. female who presents to the emergency department today via EMS because of concerns for breathing difficulty.  The patient states her shortness of breath started today.  Was accompanied by some cough and chest discomfort.  Patient has also noticed some swelling in her lower extremities.  Patient does have a history of CHF.  States this feels similar to when she has had CHF exacerbations in the past.  Was found to be hypoxic on room air by first responders so was placed on supplemental oxygen. Denies any fevers.    Records reviewed. Per medical record review patient has a history of CHF  Past Medical History:  Diagnosis Date   Anemia    IRON INFUSIONS   Aortic valve sclerosis    Arrhythmia    Arthritis    osteoarthritis   Basal cell carcinoma of skin    Brain tumor ([place])    Brain tumor ([place])    Cervical spine disease    CHF (congestive heart failure) (HCC)    Diabetes mellitus without complication (HCC)    Dysrhythmia    sinus arrhythmia   Esophageal stricture    severe, led to feeding tube placement in Sept 2019   Esophageal ulcer without bleeding    Gastrostomy tube dependent ([place])    DOES NOT EAT OR DRINK    GERD (gastroesophageal reflux disease)    Hearing loss    pt had hearing test  last month, left ear30% hearing due to stroke, 78% in right-normal for her age   History of kidney stones    Hyperlipemia    Hypertension    Kidney stones    Leaky heart valve    Meningioma (HCC)    Occlusive mesenteric ischemia (HCC)    Pulmonary hypertension (HCC)    RLS (restless legs syndrome)    Stroke [facility])    Vertigo     Patient Active Problem List   Diagnosis Date Noted    Acute on chronic systolic CHF (congestive heart failure) (HCC)    Pain of toe of left foot    Acute exacerbation of CHF (congestive heart failure) ([place]) 08/07/2021   History of CVA (cerebrovascular accident) 07/26/2021   Chronic anemia 02/15/2021   HTN (hypertension) 02/15/2021   Stroke ([place]) 02/15/2021   Hyperkalemia 02/15/2021   AKI (acute kidney injury) ([place]) 02/15/2021   Acute on chronic combined systolic and diastolic CHF (congestive heart failure) ([place]) 02/15/2021   Abnormality of gait 01/19/2021   Malnutrition of moderate degree 12/29/2020   Pressure injury of skin 12/29/2020   Anemia of chronic disease    Essential hypertension    Controlled type 2 diabetes mellitus with hyperglycemia, without long-term current use of insulin (HCC)    Hypoalbuminemia due to protein-calorie malnutrition ([place])    Stroke (cerebrum) ([place]) 12/22/2020   Abdominal pain, lower    Cerebrovascular accident (CVA) due to occlusion of left middle cerebral artery (HCC)    Right sided weakness    Weakness    Respiratory failure with hypoxia ([place]) 11/23/2019   CHF exacerbation ([place]) 11/23/2019   HFrEF (heart failure with reduced ejection fraction) ([employer]) 11/23/2019   GERD (gastroesophageal reflux disease)    Gastrostomy tube dependent ([place])  Esophageal ulcer without bleeding    Type 2 diabetes mellitus without complication, without long-term current use of insulin (HCC)    Anemia    Aortic valve disease 08/24/2019   Gastrostomy tube in place [facility]) 07/31/2019   RLS (restless legs syndrome) 07/31/2019   Chest pain, atypical 03/18/2019   Tricuspid regurgitation 02/13/2019   Heart palpitations 02/12/2019   Hyponatremia 06/23/2018   Esophageal stricture 06/11/2018   Weight loss 05/29/2018   Dysphagia 05/29/2018   Esophageal stenosis 05/05/2018   Hyperlipemia 04/04/2018   Itching 01/08/2018   Medication monitoring encounter 01/08/2018   Paronychia of toe 01/08/2018   Esophagitis, CMV ([place]) 01/01/2018    Occlusive mesenteric ischemia ([place]) 05/28/2017   Hypertension 04/30/2017   Mesenteric ischemia ([place]) 03/01/2017   Iron deficiency anemia 02/28/2017   PAD (peripheral artery disease) ([place]) 02/05/2017   Hyperlipidemia type II 04/04/2016   Pulmonary hypertension ([place]) 01/04/2015   Generalized OA 12/13/2014   Leg edema 12/13/2014   Aortic valve sclerosis 02/19/2014   Type 2 diabetes mellitus with hyperlipidemia ([place]) 12/25/2013   Acute, but ill-defined, cerebrovascular disease 12/25/2013   Unilateral primary osteoarthritis, unspecified knee 12/25/2013   Personal history of other malignant neoplasm of skin 05/28/2012    Past Surgical History:  Procedure Laterality Date   ABDOMINAL HYSTERECTOMY     APPENDECTOMY     ARTHROGRAM KNEE Left    BACK SURGERY     CERVIVAL NECK FUSION   CATARACT EXTRACTION W/PHACO Left 11/11/2018   Procedure: CATARACT EXTRACTION PHACO AND INTRAOCULAR LENS PLACEMENT ([place]) LEFT, DIABETIC;  Surgeon: Birder Robson, MD;  Location: [facility] ORS;  Service: Ophthalmology;  Laterality: Left;  [place]  00:41 CDE 6.41 Fluid pack lot # [id number]   COLONOSCOPY WITH PROPOFOL N/A 06/20/2017   Procedure: COLONOSCOPY WITH PROPOFOL;  Surgeon: Lollie Sails, MD;  Location: [facility] ENDOSCOPY;  Service: Endoscopy;  Laterality: N/A;   ESOPHAGOGASTRODUODENOSCOPY (EGD) WITH PROPOFOL N/A 03/14/2015   Procedure: ESOPHAGOGASTRODUODENOSCOPY (EGD) WITH PROPOFOL;  Surgeon: Josefine Class, MD;  Location: [facility] ENDOSCOPY;  Service: Endoscopy;  Laterality: N/A;   ESOPHAGOGASTRODUODENOSCOPY (EGD) WITH PROPOFOL N/A 03/28/2017   Procedure: ESOPHAGOGASTRODUODENOSCOPY (EGD) WITH PROPOFOL;  Surgeon: Lollie Sails, MD;  Location: [facility] ENDOSCOPY;  Service: Endoscopy;  Laterality: N/A;   ESOPHAGOGASTRODUODENOSCOPY (EGD) WITH PROPOFOL N/A 06/20/2017   Procedure: ESOPHAGOGASTRODUODENOSCOPY (EGD) WITH PROPOFOL;  Surgeon: Lollie Sails, MD;  Location: [facility] ENDOSCOPY;  Service: Endoscopy;  Laterality:  N/A;   ESOPHAGOGASTRODUODENOSCOPY (EGD) WITH PROPOFOL N/A 10/21/2017   Procedure: ESOPHAGOGASTRODUODENOSCOPY (EGD) WITH PROPOFOL;  Surgeon: Lollie Sails, MD;  Location: [facility] ENDOSCOPY;  Service: Endoscopy;  Laterality: N/A;   ESOPHAGOGASTRODUODENOSCOPY (EGD) WITH PROPOFOL N/A 04/15/2018   Procedure: ESOPHAGOGASTRODUODENOSCOPY (EGD) WITH PROPOFOL;  Surgeon: Lollie Sails, MD;  Location: [facility] ENDOSCOPY;  Service: Endoscopy;  Laterality: N/A;   ESOPHAGUS SURGERY     CLOSURE   EYE SURGERY     HYSTERECTOMY ABDOMINAL WITH SALPINGECTOMY     IR GASTROSTOMY TUBE MOD SED  06/11/2018   IR GASTROSTOMY TUBE REMOVAL  03/25/2019   IR REPLACE G-TUBE SIMPLE WO FLUORO  10/19/2020   LOWER EXTREMITY ANGIOGRAPHY Left 09/18/2021   Procedure: LOWER EXTREMITY ANGIOGRAPHY;  Surgeon: Algernon Huxley, MD;  Location: [place] CV LAB;  Service: Cardiovascular;  Laterality: Left;   PERIPHERAL VASCULAR CATHETERIZATION N/A 01/23/2016   Procedure: Visceral Venography;  Surgeon: Algernon Huxley, MD;  Location: [place] CV LAB;  Service: Cardiovascular;  Laterality: N/A;   PERIPHERAL VASCULAR CATHETERIZATION  01/23/2016   Procedure: Peripheral Vascular  Intervention;  Surgeon: Algernon Huxley, MD;  Location: [place] CV LAB;  Service: Cardiovascular;;   UPPER ESOPHAGEAL ENDOSCOPIC ULTRASOUND (EUS) N/A 12/05/2017   Procedure: UPPER ESOPHAGEAL ENDOSCOPIC ULTRASOUND (EUS);  Surgeon: Jola Schmidt, MD;  Location: [facility] ENDOSCOPY;  Service: Endoscopy;  Laterality: N/A;   VISCERAL ANGIOGRAPHY N/A 03/18/2017   Procedure: Visceral Angiography;  Surgeon: Algernon Huxley, MD;  Location: [place] CV LAB;  Service: Cardiovascular;  Laterality: N/A;   VISCERAL ARTERY INTERVENTION N/A 03/18/2017   Procedure: Visceral Artery Intervention;  Surgeon: Algernon Huxley, MD;  Location: [place] CV LAB;  Service: Cardiovascular;  Laterality: N/A;    Prior to Admission medications   Medication Sig Start Date End Date Taking? Authorizing  Provider  acetaminophen (TYLENOL) 160 MG/5ML solution Place 20.3 mLs (650 mg total) into feeding tube every 6 (six) hours as needed for mild pain. 06/13/18   Demetrios Loll, MD  allopurinol (ZYLOPRIM) 100 MG tablet Take by mouth. 05/26/21 11/22/21  [provider]  ascorbic acid (VITAMIN C) 250 MG tablet Take 1 tablet by G tube 2 (two) times daily Patient not taking: Reported on 09/04/2021 06/13/18   [provider]  aspirin 81 MG chewable tablet by Nasogastric route. 12/22/20   [provider]  Bromfenac Sodium 0.09 % SOLN Place 1 drop into the left eye 2 (two) times daily. Patient not taking: Reported on 08/10/2021 06/15/21   [provider]  carvedilol (COREG) 3.125 MG tablet Place 1 tablet (3.125 mg total) into feeding tube 2 (two) times daily with a meal. 12/22/20   Regalado, Belkys A, MD  clopidogrel (PLAVIX) 75 MG tablet GIVE 1 TABLET VIA FEEDING TUBE EVERY [name] AS DIRECTED 12/30/20   Love, Ivan Anchors, PA-C  colchicine 0.6 MG tablet Daily for three days the as needed for gout attack Patient not taking: Reported on 08/10/2021 08/09/21   Loletha Grayer, MD  furosemide (LASIX) 20 MG tablet Place 1 tablet (20 mg total) into feeding tube 2 (two) times daily. 08/09/21   Loletha Grayer, MD  glipiZIDE (GLUCOTROL) 5 MG tablet Take 5 mg by mouth every morning. 08/30/21   [provider]  hydrocerin (EUCERIN) CREA Apply 1 application topically 2 (two) times daily. To bilateral feet 12/27/20   Love, Ivan Anchors, PA-C  Hypromellose 0.2 % SOLN Place 1 drop into both eyes 3 (three) times daily as needed (dry eyes). Patient not taking: Reported on 09/12/2021    [provider]  lisinopril (ZESTRIL) 2.5 MG tablet Take 1 tablet by mouth daily. 07/21/21 01/17/22  [provider]  loperamide (IMODIUM) 1 MG/5ML solution Take 1 mg by mouth as needed for diarrhea or loose stools. Patient not taking: Reported on 09/18/2021    [provider]  metFORMIN  (GLUCOPHAGE) 500 MG tablet Place 1 tablet (500 mg total) into feeding tube 2 (two) times daily with a meal. 02/17/21   Fritzi Mandes, MD  Multiple Vitamin (MULTIVITAMIN) LIQD Place 15 mLs into feeding tube daily. 06/13/18   Demetrios Loll, MD  Nutritional Supplements (FEEDING SUPPLEMENT, KATE FARMS STANDARD 1.4,) LIQD liquid Place 325 mLs into feeding tube 4 (four) times daily. Patient taking differently: Place 325 mLs into feeding tube 3 (three) times daily between meals. 12/30/20   Love, Ivan Anchors, PA-C  pantoprazole sodium (PROTONIX) 40 mg/20 mL PACK Place 20 mLs (40 mg total) into feeding tube daily. 12/30/20   Love, Ivan Anchors, PA-C  polyethylene glycol (MIRALAX / GLYCOLAX) 17 g packet Place 17 g into feeding tube  daily as needed. Patient not taking: Reported on 09/04/2021 12/30/20   Love, Ivan Anchors, PA-C  pregabalin (LYRICA) 25 MG capsule Take 1 capsule (25 mg total) by mouth 2 (two) times daily. Patient taking differently: Take 25 mg by mouth daily. 08/09/21   Loletha Grayer, MD  rOPINIRole (REQUIP) 2 MG tablet Place 1 tablet (2 mg total) into feeding tube 2 (two) times daily. 12/30/20   Love, Ivan Anchors, PA-C  Water For Irrigation, Sterile (FREE WATER) SOLN Place 120 mLs into feeding tube 4 (four) times daily. Patient taking differently: Place 160 mLs into feeding tube 3 (three) times daily. 08/09/21   Loletha Grayer, MD    Allergies Elemental sulfur, Gabapentin, Lipitor [atorvastatin], Mevacor [lovastatin], Milk-related compounds, Septra [sulfamethoxazole-trimethoprim], Statins, and Zocor [simvastatin]  Family History  Problem Relation Age of Onset   Stroke Mother    Hypertension Mother    Heart disease Mother    Cancer Father    Heart disease Father    Heart attack Sister    Diabetes Sister    Heart attack Brother    Thyroid cancer Daughter    Cancer - Colon Daughter     Social History Social History   Tobacco Use   Smoking status: Never   Smokeless tobacco: Never  Vaping Use   Vaping Use:  Never used  Substance Use Topics   Alcohol use: Never   Drug use: Never    Review of Systems Constitutional: No fever/chills Eyes: No visual changes. ENT: No sore throat. Cardiovascular: Positive for chest discomfort.  Respiratory: Positive for shortness of breath. Gastrointestinal: No abdominal pain.  No nausea, no vomiting.  No diarrhea.   Genitourinary: Negative for dysuria. Musculoskeletal: Positive for leg swelling.  Skin: Negative for rash. Neurological: Negative for headaches, focal weakness or numbness.  ____________________________________________   PHYSICAL EXAM:  VITAL SIGNS: ED Triage Vitals  Enc Vitals Group     BP 09/21/21 2144 (!) 180/96     Pulse Rate 09/21/21 2144 (!) 103     Resp 09/21/21 2144 (!) 30     Temp --      Temp Source 09/21/21 2144 Oral     SpO2 09/21/21 2144 100 %     Weight --      Height --      Head Circumference --      Peak Flow --      Pain Score 09/21/21 2147 10    Constitutional: Alert and oriented.  Eyes: Conjunctivae are normal.  ENT      Head: Normocephalic and atraumatic.      Nose: No congestion/rhinnorhea.      Mouth/Throat: Mucous membranes are moist.      Neck: No stridor. Hematological/Lymphatic/Immunilogical: No cervical lymphadenopathy. Cardiovascular: Tachycardic, regular rhythm.  No murmurs, rubs, or gallops.  Respiratory: Increased work of breathing. Tachypnea. Diffuse rales.  Gastrointestinal: Soft and non tender. No rebound. No guarding.  Genitourinary: Deferred Musculoskeletal: Normal range of motion in all extremities.  Neurologic:  Normal speech and language. No gross focal neurologic deficits are appreciated.  Skin:  Skin is warm, dry and intact. No rash noted. Psychiatric: Mood and affect are normal. Speech and behavior are normal. Patient exhibits appropriate insight and judgment.  ____________________________________________    LABS (pertinent positives/negatives)  BNP 1594.8 Trop hs 67 CBC wbc  7.3, hgb 12.2, plt 269 BMP na 138, k 6.1 (hemolysis present), glu 253, cr 1.19  ____________________________________________   EKG  I, Nance Pear, attending physician, personally viewed and interpreted this  EKG  EKG Time: 2148 Rate: 100 Rhythm: sinus rhythm  Axis: normal Intervals: qtc 477 QRS: LVH ST changes: no st elevation Impression: abnormal ekg  ____________________________________________    RADIOLOGY  CXR Stable cardiomegaly. Progressive cardiogenic failure.  ____________________________________________   PROCEDURES  Procedures  ____________________________________________   INITIAL IMPRESSION / ASSESSMENT AND PLAN / ED COURSE  Pertinent labs & imaging results that were available during my care of the patient were reviewed by me and considered in my medical decision making (see chart for details).   Patient presented to the emergency department today because of concerns for shortness of breath.  Patient does have a history of CHF.  Did require supplemental oxygen.  Work-up here is consistent with CHF exacerbation.  X-ray does show some progressive cardiogenic failure and BNP is significantly elevated.  Additionally patient had slight elevation of troponin which at this time I think is likely secondary to CHF rather than true NSTEMI.  However will give aspirin at this time and will wait on heparin for repeat troponin.  Patient was ordered Lasix.  Additionally potassium was slightly elevated at 6.1.  This was hemolyzed so will plan on repeating however will give dose of calcium in case it is truly elevated. Patient was trailed on bipap given continued difficulty with breathing and CHF. Will plan on admission to the hospital service.   ____________________________________________   FINAL CLINICAL IMPRESSION(S) / ED DIAGNOSES  Final diagnoses:  Shortness of breath  Acute on chronic congestive heart failure, unspecified heart failure type [facility])     Note:  This dictation was prepared with Dragon dictation. Any transcriptional errors that result from this process are unintentional     Nance Pear, MD 09/21/21 2253

## 2021-09-21 NOTE — Progress Notes (Signed)
Anticoagulation monitoring(Lovenox):  87 yo female ordered Lovenox 40 mg Q24h    Filed Weights   09/21/21 2147  Weight: 54 kg (119 lb)   BMI 19.8    Lab Results  Component Value Date   CREATININE 1.19 (H) 09/21/2021   CREATININE 1.12 (H) 09/18/2021   CREATININE 1.11 (H) 09/04/2021   Estimated Creatinine Clearance: 26.8 mL/min (A) (by C-G formula based on SCr of 1.19 mg/dL (H)). Hemoglobin & Hematocrit     Component Value Date/Time   HGB 12.2 09/21/2021 2155   HGB 12.3 12/23/2014 1008   HCT 39.6 09/21/2021 2155     Per Protocol for Patient with estCrcl > 30 ml/min and BMI > 30, will transition to Lovenox 30 mg Q24h.

## 2021-09-21 NOTE — H&P (Signed)
History and Physical    [name] Day WSF:[id number] DOB: [birth year] DOA: 09/21/2021  PCP: [name], MD   Patient coming from: home  I have personally briefly reviewed patient's relevant medical records in [place]  Chief Complaint: shortness of breath  HPI: [name] Day is a 87 y.o. female with medical history significant for HTN, DM, CVA stroke, gastrostomy secondary to esophageal stricture, combined CHF, CKD 3a and aortic stenosis who presents with shortness of breath that started on the day of arrival.  History is provided by daughter at the bedside due to patient's clinical condition.  It was associated with anterior chest which resolved by arrival.  Patient has not otherwise been ill and daughter denies any nausea, vomiting, fever or chills, abdominal pain or diarrhea.  Patient did have an episode of shortness of breath a few days prior and family gave patient an extra dose of Lasix and she was doing well until today.  On arrival of EMS O2 sat was 86% on room air improving to 100% on 8 L NRB.  ED course: On arrival, BP 180/96 with pulse 103, respirations 30.  O2 sat 100% on NRB, transitioned to BiPAP  Blood work: Troponin 67, BNP 1594 Potassium 6.1, creatinine 1.19, glucose 253 CBC WNL  EKG, personally viewed and interpreted: Sinus tachycardia at 100 with no acute ST-T wave changes  Chest x-ray with stable cardiomegaly.  Moderate progressive cardiogenic failure  Patient was transitioned to BiPAP due to continued increased work of breathing.  She was treated with Lasix and given a dose of calcium gluconate for her hyperkalemia.  Hospitalist consulted for admission.  Review of Systems: Unable to obtain from patient due to clinical condition and being on BiPAP  Assessment/Plan Principal Problem:   Acute on chronic combined systolic and diastolic CHF (congestive heart failure) (HCC) Active Problems:   Acute respiratory failure with hypoxia (HCC)   Elevated  troponin   Essential hypertension   AKI (acute kidney injury) ([place])   CHF exacerbation (HCC)   Esophageal stenosis   Gastrostomy tube in place [facility])   Type 2 diabetes mellitus without complication, without long-term current use of insulin (HCC)  Acute on chronic combined systolic and diastolic CHF  -Patient presents with chest pain and shortness of breath, BNP 1594 with [id number], progressive cardiogenic failure - Echocardiogram 10/26 with EF 40-45% and G2DD - IV Lasix - Continue home lisinopril spironolactone and Coreg - Daily weights with intake and output monitoring    Elevated troponin, suspect demand ischemia -Troponin 67, EKG nonacute,  - Suspect demand ischemia related to CHF exacerbation - Continue to trend to peak -Received aspirin from the ED - Cardiology consult   Acute hypoxic respiratory failure -Secondary to CHF exacerbation - O2 sat 86% on room air with EMS requiring NRB - Continue BiPAP and wean as tolerated  Chronic anemia - Patient follows with hematology, gets IV iron infusions - Has had blood transfusions in the past - Monitor H&H in view of Heparin treatment     Hypertension -Uncontrolled - Continue home lisinopril and Coreg - Hydralazine as needed for BP control     Type 2 diabetes mellitus with hyperlipidemia (HCC) - Sliding scale insulin coverage     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 This is  judged to be reasonable and necessary in order to provide the required intensity of service to ensure the patient's safety given the presenting symptoms, physical exam findings, and initial radiographic and laboratory data in the context of their  Comorbid conditions.   Patient requires inpatient status due to high intensity of service, high risk for further deterioration and high frequency of surveillance required.   I certify that at the point of admission it is my clinical judgment that the patient will require inpatient hospital care spanning beyond 2 midnights   Physical Exam: Vitals:   09/21/21 2144 09/21/21 2147  BP: (!) 180/96   Pulse: (!) 103   Resp: (!) 30   TempSrc: Oral   SpO2: 100%   Weight:  54 kg  Height:  5\' 5"  (1.651 m)   Constitutional: Alert, sitting upright.  BiPAP mask on, tachypneic HEENT:      Head: Normocephalic and atraumatic.         Eyes: PERLA, EOMI, Conjunctivae are normal. Sclera is non-icteric.       Mouth/Throat: Not examined. On Bipap      Neck: Supple with no signs of meningismus. Cardiovascular: Tachycardic. No murmurs, gallops, or rubs. 2+ symmetrical distal pulses are present . No JVD. No  LE edema Respiratory: Respiratory effort increased with bibasilar rales.  Gastrointestinal: Soft, non tender, non distended. Positive bowel sounds.  Genitourinary: No CVA tenderness. Musculoskeletal: Nontender with normal range of motion in all extremities. No cyanosis, or erythema of extremities. Neurologic:  Face is symmetric. Moving all extremities. No gross focal neurologic deficits . Skin: Skin is warm, dry.  No rash or ulcers Psychiatric: Mood and affect are appropriate     Past Medical History:  Diagnosis Date   Anemia    IRON INFUSIONS   Aortic valve sclerosis    Arrhythmia    Arthritis    osteoarthritis   Basal cell carcinoma of skin    Brain tumor (HCC)    Brain tumor ([place])    Cervical spine disease    CHF (congestive heart failure) (HCC)     Diabetes mellitus without complication (HCC)    Dysrhythmia    sinus arrhythmia   Esophageal stricture    severe, led to feeding tube placement in Sept 2019   Esophageal ulcer without bleeding    Gastrostomy tube dependent ([place])    DOES NOT EAT OR DRINK    GERD (gastroesophageal reflux disease)    Hearing loss    pt had hearing test  last month, left ear30% hearing due to stroke, 78% in right-normal for her age   History of kidney stones    Hyperlipemia    Hypertension    Kidney stones    Leaky heart valve    Meningioma (HCC)    Occlusive mesenteric ischemia (HCC)    Pulmonary hypertension (HCC)    RLS (restless legs syndrome)    Stroke (HCC)    Vertigo     Past Surgical History:  Procedure Laterality Date   ABDOMINAL HYSTERECTOMY     APPENDECTOMY     ARTHROGRAM KNEE Left    BACK SURGERY     CERVIVAL NECK FUSION   CATARACT EXTRACTION W/PHACO Left  11/11/2018   Procedure: CATARACT EXTRACTION PHACO AND INTRAOCULAR LENS PLACEMENT (IOC) LEFT, DIABETIC;  Surgeon: Birder Robson, MD;  Location: [facility] ORS;  Service: Ophthalmology;  Laterality: Left;  [place]  00:41 CDE 6.41 Fluid pack lot # [id number]   COLONOSCOPY WITH PROPOFOL N/A 06/20/2017   Procedure: COLONOSCOPY WITH PROPOFOL;  Surgeon: Lollie Sails, MD;  Location: [facility] ENDOSCOPY;  Service: Endoscopy;  Laterality: N/A;   ESOPHAGOGASTRODUODENOSCOPY (EGD) WITH PROPOFOL N/A 03/14/2015   Procedure: ESOPHAGOGASTRODUODENOSCOPY (EGD) WITH PROPOFOL;  Surgeon: Josefine Class, MD;  Location: [facility] ENDOSCOPY;  Service: Endoscopy;  Laterality: N/A;   ESOPHAGOGASTRODUODENOSCOPY (EGD) WITH PROPOFOL N/A 03/28/2017   Procedure: ESOPHAGOGASTRODUODENOSCOPY (EGD) WITH PROPOFOL;  Surgeon: Lollie Sails, MD;  Location[facility] ENDOSCOPY;  Service: Endoscopy;  Laterality: N/A;   ESOPHAGOGASTRODUODENOSCOPY (EGD) WITH PROPOFOL N/A 06/20/2017   Procedure: ESOPHAGOGASTRODUODENOSCOPY (EGD) WITH PROPOFOL;  Surgeon: Lollie Sails, MD;  Location:  [facility] ENDOSCOPY;  Service: Endoscopy;  Laterality: N/A;   ESOPHAGOGASTRODUODENOSCOPY (EGD) WITH PROPOFOL N/A 10/21/2017   Procedure: ESOPHAGOGASTRODUODENOSCOPY (EGD) WITH PROPOFOL;  Surgeon: Lollie Sails, MD;  Location: [facility] ENDOSCOPY;  Service: Endoscopy;  Laterality: N/A;   ESOPHAGOGASTRODUODENOSCOPY (EGD) WITH PROPOFOL N/A 04/15/2018   Procedure: ESOPHAGOGASTRODUODENOSCOPY (EGD) WITH PROPOFOL;  Surgeon: Lollie Sails, MD;  Location: [facility] ENDOSCOPY;  Service: Endoscopy;  Laterality: N/A;   ESOPHAGUS SURGERY     CLOSURE   EYE SURGERY     HYSTERECTOMY ABDOMINAL WITH SALPINGECTOMY     IR GASTROSTOMY TUBE MOD SED  06/11/2018   IR GASTROSTOMY TUBE REMOVAL  03/25/2019   IR REPLACE G-TUBE SIMPLE WO FLUORO  10/19/2020   LOWER EXTREMITY ANGIOGRAPHY Left 09/18/2021   Procedure: LOWER EXTREMITY ANGIOGRAPHY;  Surgeon: Algernon Huxley, MD;  Location: [place] CV LAB;  Service: Cardiovascular;  Laterality: Left;   PERIPHERAL VASCULAR CATHETERIZATION N/A 01/23/2016   Procedure: Visceral Venography;  Surgeon: Algernon Huxley, MD;  Location: [place] CV LAB;  Service: Cardiovascular;  Laterality: N/A;   PERIPHERAL VASCULAR CATHETERIZATION  01/23/2016   Procedure: Peripheral Vascular Intervention;  Surgeon: Algernon Huxley, MD;  Location: [place] CV LAB;  Service: Cardiovascular;;   UPPER ESOPHAGEAL ENDOSCOPIC ULTRASOUND (EUS) N/A 12/05/2017   Procedure: UPPER ESOPHAGEAL ENDOSCOPIC ULTRASOUND (EUS);  Surgeon: Jola Schmidt, MD;  Location: [facility] ENDOSCOPY;  Service: Endoscopy;  Laterality: N/A;   VISCERAL ANGIOGRAPHY N/A 03/18/2017   Procedure: Visceral Angiography;  Surgeon: Algernon Huxley, MD;  Location: [place] CV LAB;  Service: Cardiovascular;  Laterality: N/A;   VISCERAL ARTERY INTERVENTION N/A 03/18/2017   Procedure: Visceral Artery Intervention;  Surgeon: Algernon Huxley, MD;  Location: [place] CV LAB;  Service: Cardiovascular;  Laterality: N/A;     reports that she has never smoked. She has  never used smokeless tobacco. She reports that she does not drink alcohol and does not use drugs.  Allergies  Allergen Reactions   Elemental Sulfur Diarrhea and Nausea And Vomiting   Gabapentin Swelling   Lipitor [Atorvastatin] Other (See Comments)    Muscle aches   Mevacor [Lovastatin] Other (See Comments)    Muscle aches   Milk-Related Compounds Other (See Comments)    Large quantities cause headaches    Septra [Sulfamethoxazole-Trimethoprim] Other (See Comments)    Unknown   Statins Other (See Comments)    Muscle pain   Zocor [Simvastatin] Other (See Comments)    Muscle aches    Family History  Problem Relation Age of Onset   Stroke Mother    Hypertension Mother  Heart disease Mother    Cancer Father    Heart disease Father    Heart attack Sister    Diabetes Sister    Heart attack Brother    Thyroid cancer Daughter    Cancer - Colon Daughter       Prior to Admission medications   Medication Sig Start Date End Date Taking? Authorizing Provider  acetaminophen (TYLENOL) 160 MG/5ML solution Place 20.3 mLs (650 mg total) into feeding tube every 6 (six) hours as needed for mild pain. 06/13/18   Demetrios Loll, MD  allopurinol (ZYLOPRIM) 100 MG tablet Take by mouth. 05/26/21 11/22/21  [provider]  ascorbic acid (VITAMIN C) 250 MG tablet Take 1 tablet by G tube 2 (two) times daily Patient not taking: Reported on 09/04/2021 06/13/18   [provider]  aspirin 81 MG chewable tablet by Nasogastric route. 12/22/20   [provider]  Bromfenac Sodium 0.09 % SOLN Place 1 drop into the left eye 2 (two) times daily. Patient not taking: Reported on 08/10/2021 06/15/21   [provider]  carvedilol (COREG) 3.125 MG tablet Place 1 tablet (3.125 mg total) into feeding tube 2 (two) times daily with a meal. 12/22/20   Regalado, Belkys A, MD  clopidogrel (PLAVIX) 75 MG tablet GIVE 1 TABLET VIA FEEDING TUBE EVERY DAY AS DIRECTED 12/30/20   Love, Ivan Anchors, PA-C   colchicine 0.6 MG tablet Daily for three days the as needed for gout attack Patient not taking: Reported on 08/10/2021 08/09/21   Loletha Grayer, MD  furosemide (LASIX) 20 MG tablet Place 1 tablet (20 mg total) into feeding tube 2 (two) times daily. 08/09/21   Loletha Grayer, MD  glipiZIDE (GLUCOTROL) 5 MG tablet Take 5 mg by mouth every morning. 08/30/21   [provider]  hydrocerin (EUCERIN) CREA Apply 1 application topically 2 (two) times daily. To bilateral feet 12/27/20   Love, Ivan Anchors, PA-C  Hypromellose 0.2 % SOLN Place 1 drop into both eyes 3 (three) times daily as needed (dry eyes). Patient not taking: Reported on 09/12/2021    [provider]  lisinopril (ZESTRIL) 2.5 MG tablet Take 1 tablet by mouth daily. 07/21/21 01/17/22  [provider]  loperamide (IMODIUM) 1 MG/5ML solution Take 1 mg by mouth as needed for diarrhea or loose stools. Patient not taking: Reported on 09/18/2021    [provider]  metFORMIN (GLUCOPHAGE) 500 MG tablet Place 1 tablet (500 mg total) into feeding tube 2 (two) times daily with a meal. 02/17/21   Fritzi Mandes, MD  Multiple Vitamin (MULTIVITAMIN) LIQD Place 15 mLs into feeding tube daily. 06/13/18   Demetrios Loll, MD  Nutritional Supplements (FEEDING SUPPLEMENT, KATE FARMS STANDARD 1.4,) LIQD liquid Place 325 mLs into feeding tube 4 (four) times daily. Patient taking differently: Place 325 mLs into feeding tube 3 (three) times daily between meals. 12/30/20   Love, Ivan Anchors, PA-C  pantoprazole sodium (PROTONIX) 40 mg/20 mL PACK Place 20 mLs (40 mg total) into feeding tube daily. 12/30/20   Love, Ivan Anchors, PA-C  polyethylene glycol (MIRALAX / GLYCOLAX) 17 g packet Place 17 g into feeding tube daily as needed. Patient not taking: Reported on 09/04/2021 12/30/20   Love, Ivan Anchors, PA-C  pregabalin (LYRICA) 25 MG capsule Take 1 capsule (25 mg total) by mouth 2 (two) times daily. Patient taking differently: Take 25 mg by mouth daily.  08/09/21   Loletha Grayer, MD  rOPINIRole (REQUIP) 2 MG tablet Place 1 tablet (2 mg total)  into feeding tube 2 (two) times daily. 12/30/20   Love, Ivan Anchors, PA-C  Water For Irrigation, Sterile (FREE WATER) SOLN Place 120 mLs into feeding tube 4 (four) times daily. Patient taking differently: Place 160 mLs into feeding tube 3 (three) times daily. 08/09/21   Loletha Grayer, MD      Labs on Admission: I have personally reviewed following labs and imaging studies  CBC: Recent Labs  Lab 09/21/21 2155  WBC 7.3  NEUTROABS 4.9  HGB 12.2  HCT 39.6  MCV 94.1  PLT 676   Basic Metabolic Panel: Recent Labs  Lab 09/18/21 1011 09/21/21 2155  NA  --  138  K  --  6.1*  CL  --  105  CO2  --  25  GLUCOSE  --  253*  BUN 64* 70*  CREATININE 1.12* 1.19*  CALCIUM  --  7.8*   GFR: Estimated Creatinine Clearance: 26.8 mL/min (A) (by C-G formula based on SCr of 1.19 mg/dL (H)). Liver Function Tests: No results for input(s): AST, ALT, ALKPHOS, BILITOT, PROT, ALBUMIN in the last 168 hours. No results for input(s): LIPASE, AMYLASE in the last 168 hours. No results for input(s): AMMONIA in the last 168 hours. Coagulation Profile: No results for input(s): INR, PROTIME in the last 168 hours. Cardiac Enzymes: No results for input(s): CKTOTAL, CKMB, CKMBINDEX, TROPONINI in the last 168 hours. BNP (last 3 results) No results for input(s): PROBNP in the last 8760 hours. HbA1C: No results for input(s): HGBA1C in the last 72 hours. CBG: Recent Labs  Lab 09/18/21 1010 09/18/21 1256  GLUCAP 174* 146*   Lipid Profile: No results for input(s): CHOL, HDL, LDLCALC, TRIG, CHOLHDL, LDLDIRECT in the last 72 hours. Thyroid Function Tests: No results for input(s): TSH, T4TOTAL, FREET4, T3FREE, THYROIDAB in the last 72 hours. Anemia Panel: No results for input(s): VITAMINB12, FOLATE, FERRITIN, TIBC, IRON, RETICCTPCT in the last 72 hours. Urine analysis:    Component Value Date/Time   COLORURINE YELLOW  (A) 08/08/2021 0314   APPEARANCEUR HAZY (A) 08/08/2021 0314   LABSPEC 1.005 08/08/2021 0314   PHURINE 8.0 08/08/2021 0314   GLUCOSEU NEGATIVE 08/08/2021 0314   HGBUR NEGATIVE 08/08/2021 0314   BILIRUBINUR NEGATIVE 08/08/2021 0314   KETONESUR NEGATIVE 08/08/2021 0314   PROTEINUR NEGATIVE 08/08/2021 0314   NITRITE NEGATIVE 08/08/2021 0314   LEUKOCYTESUR LARGE (A) 08/08/2021 0314    Radiological Exams on Admission: DG Chest Portable 1 View  Result Date: 09/21/2021 CLINICAL DATA:  Dyspnea EXAM: PORTABLE CHEST 1 VIEW COMPARISON:  08/07/2021 FINDINGS: The lungs are symmetrically expanded. Moderate interstitial pulmonary infiltrate and small bilateral pleural effusions persist in keeping with moderate cardiogenic failure. This appears progressive since prior examination. Cardiomegaly is again noted, unchanged. No pneumothorax. No acute bone abnormality. IMPRESSION: Stable cardiomegaly.  Moderate, progressive cardiogenic failure. Electronically Signed   By: Fidela Salisbury M.D.   On: 09/21/2021 22:13       Athena Masse MD [facility]   09/21/2021, 11:25 PM

## 2021-09-22 DIAGNOSIS — I5043 Acute on chronic combined systolic (congestive) and diastolic (congestive) heart failure: Secondary | ICD-10-CM | POA: Diagnosis not present

## 2021-09-22 LAB — BASIC METABOLIC PANEL
Anion gap: 6 (ref 5–15)
BUN: 77 mg/dL — ABNORMAL HIGH (ref 8–23)
CO2: 30 mmol/L (ref 22–32)
Calcium: 8.6 mg/dL — ABNORMAL LOW (ref 8.9–10.3)
Chloride: 103 mmol/L (ref 98–111)
Creatinine, Ser: 1.21 mg/dL — ABNORMAL HIGH (ref 0.44–1.00)
GFR, Estimated: 43 mL/min — ABNORMAL LOW (ref >60)
Glucose, Bld: 100 mg/dL — ABNORMAL HIGH (ref 70–99)
Potassium: 4.6 mmol/L (ref 3.5–5.1)
Sodium: 139 mmol/L (ref 135–145)

## 2021-09-22 LAB — TROPONIN I (HIGH SENSITIVITY): Troponin I (High Sensitivity): 93 ng/L — ABNORMAL HIGH (ref <18)

## 2021-09-22 LAB — CBG MONITORING, ED
Glucose-Capillary: 121 mg/dL — ABNORMAL HIGH (ref 70–99)
Glucose-Capillary: 171 mg/dL — ABNORMAL HIGH (ref 70–99)
Glucose-Capillary: 188 mg/dL — ABNORMAL HIGH (ref 70–99)
Glucose-Capillary: 198 mg/dL — ABNORMAL HIGH (ref 70–99)
Glucose-Capillary: 294 mg/dL — ABNORMAL HIGH (ref 70–99)

## 2021-09-22 MED ORDER — KATE FARMS STANDARD 1.4 PO LIQD
325.0000 mL | Freq: Four times a day (QID) | ORAL | Status: DC
  Administered 2021-09-22 (×2): 160 mL
  Administered 2021-09-22 – 2021-09-23 (×2): 325 mL
  Administered 2021-09-23: 02:00:00 160 mL
  Administered 2021-09-23 – 2021-09-24 (×4): 325 mL
  Filled 2021-09-22: qty 325

## 2021-09-22 MED ORDER — FREE WATER
150.0000 mL | Freq: Four times a day (QID) | Status: DC
  Administered 2021-09-22 – 2021-09-24 (×7): 150 mL
  Filled 2021-09-22 (×8): qty 150

## 2021-09-22 MED ORDER — ROPINIROLE HCL 1 MG PO TABS
2.0000 mg | ORAL_TABLET | Freq: Two times a day (BID) | ORAL | Status: DC
  Administered 2021-09-23 – 2021-09-24 (×4): 2 mg
  Filled 2021-09-22 (×6): qty 2

## 2021-09-22 NOTE — Consult Note (Signed)
CARDIOLOGY CONSULT NOTE               Patient ID: [name] Day MRN: [id number] DOB/AGE: Jun 27, 1929 87 y.o.  Admit date: 09/21/2021 Referring Physician Dr Barb Merino hospitalist Primary Physician Dr. Ginette Pitman primary Primary Cardiologist Fath Reason for Consultation congestive heart failure shortness of breath hypoxemia  HPI: Patient 87 year old female history of hypertension diabetes previous CVA gastrostomy secondary to sepsis stricture systolic and diastolic congestive heart failure renal sufficiency aortic stenosis presented with worsening shortness of breath patient has significant dyspnea she is gotten ill over the past few days and got progressively worse she has had multiple episodes of acute heart failure requiring supplemental oxygen and diuresis by the time the call rescue her sats were in the 80s but improved after supplemental oxygen so she was brought to the emergency room elevated blood pressure hypoxic weak fatigue but no chest pain.  Patient now resting reasonably comfortable in emergency room after diuresis with Lasix has known cardiomyopathy EF around 40 to 45%  Review of systems complete and found to be negative unless listed above      Past Medical History:  Diagnosis Date   Anemia    IRON INFUSIONS   Aortic valve sclerosis    Arrhythmia    Arthritis    osteoarthritis   Basal cell carcinoma of skin    Brain tumor ([place])    Brain tumor ([place])    Cervical spine disease    CHF (congestive heart failure) (HCC)    Diabetes mellitus without complication (HCC)    Dysrhythmia    sinus arrhythmia   Esophageal stricture    severe, led to feeding tube placement in Sept 2019   Esophageal ulcer without bleeding    Gastrostomy tube dependent ([place])    DOES NOT EAT OR DRINK    GERD (gastroesophageal reflux disease)    Hearing loss    pt had hearing test  last month, left ear30% hearing due to stroke, 78% in right-normal for her age   History of kidney stones     Hyperlipemia    Hypertension    Kidney stones    Leaky heart valve    Meningioma (HCC)    Occlusive mesenteric ischemia (HCC)    Pulmonary hypertension (HCC)    RLS (restless legs syndrome)    Stroke ([place])    Vertigo     Past Surgical History:  Procedure Laterality Date   ABDOMINAL HYSTERECTOMY     APPENDECTOMY     ARTHROGRAM KNEE Left    BACK SURGERY     CERVIVAL NECK FUSION   CATARACT EXTRACTION W/PHACO Left 11/11/2018   Procedure: CATARACT EXTRACTION PHACO AND INTRAOCULAR LENS PLACEMENT (IOC) LEFT, DIABETIC;  Surgeon: Birder Robson, MD;  Location: [facility] ORS;  Service: Ophthalmology;  Laterality: Left;  [place]  00:41 CDE 6.41 Fluid pack lot # [id number]   COLONOSCOPY WITH PROPOFOL N/A 06/20/2017   Procedure: COLONOSCOPY WITH PROPOFOL;  Surgeon: Lollie Sails, MD;  Location[facility] ENDOSCOPY;  Service: Endoscopy;  Laterality: N/A;   ESOPHAGOGASTRODUODENOSCOPY (EGD) WITH PROPOFOL N/A 03/14/2015   Procedure: ESOPHAGOGASTRODUODENOSCOPY (EGD) WITH PROPOFOL;  Surgeon: Josefine Class, MD;  Location: [facility] ENDOSCOPY;  Service: Endoscopy;  Laterality: N/A;   ESOPHAGOGASTRODUODENOSCOPY (EGD) WITH PROPOFOL N/A 03/28/2017   Procedure: ESOPHAGOGASTRODUODENOSCOPY (EGD) WITH PROPOFOL;  Surgeon: Lollie Sails, MD;  Location: [facility] ENDOSCOPY;  Service: Endoscopy;  Laterality: N/A;   ESOPHAGOGASTRODUODENOSCOPY (EGD) WITH PROPOFOL N/A 06/20/2017   Procedure: ESOPHAGOGASTRODUODENOSCOPY (EGD) WITH PROPOFOL;  Surgeon: Gustavo Lah,  Billie Ruddy, MD;  Location: [facility] ENDOSCOPY;  Service: Endoscopy;  Laterality: N/A;   ESOPHAGOGASTRODUODENOSCOPY (EGD) WITH PROPOFOL N/A 10/21/2017   Procedure: ESOPHAGOGASTRODUODENOSCOPY (EGD) WITH PROPOFOL;  Surgeon: Lollie Sails, MD;  Location: [facility] ENDOSCOPY;  Service: Endoscopy;  Laterality: N/A;   ESOPHAGOGASTRODUODENOSCOPY (EGD) WITH PROPOFOL N/A 04/15/2018   Procedure: ESOPHAGOGASTRODUODENOSCOPY (EGD) WITH PROPOFOL;  Surgeon: Lollie Sails, MD;  Location: [facility]  ENDOSCOPY;  Service: Endoscopy;  Laterality: N/A;   ESOPHAGUS SURGERY     CLOSURE   EYE SURGERY     HYSTERECTOMY ABDOMINAL WITH SALPINGECTOMY     IR GASTROSTOMY TUBE MOD SED  06/11/2018   IR GASTROSTOMY TUBE REMOVAL  03/25/2019   IR REPLACE G-TUBE SIMPLE WO FLUORO  10/19/2020   LOWER EXTREMITY ANGIOGRAPHY Left 09/18/2021   Procedure: LOWER EXTREMITY ANGIOGRAPHY;  Surgeon: Algernon Huxley, MD;  Location: [place] CV LAB;  Service: Cardiovascular;  Laterality: Left;   PERIPHERAL VASCULAR CATHETERIZATION N/A 01/23/2016   Procedure: Visceral Venography;  Surgeon: Algernon Huxley, MD;  Location: [place] CV LAB;  Service: Cardiovascular;  Laterality: N/A;   PERIPHERAL VASCULAR CATHETERIZATION  01/23/2016   Procedure: Peripheral Vascular Intervention;  Surgeon: Algernon Huxley, MD;  Location: [place] CV LAB;  Service: Cardiovascular;;   UPPER ESOPHAGEAL ENDOSCOPIC ULTRASOUND (EUS) N/A 12/05/2017   Procedure: UPPER ESOPHAGEAL ENDOSCOPIC ULTRASOUND (EUS);  Surgeon: Jola Schmidt, MD;  Location: [facility] ENDOSCOPY;  Service: Endoscopy;  Laterality: N/A;   VISCERAL ANGIOGRAPHY N/A 03/18/2017   Procedure: Visceral Angiography;  Surgeon: Algernon Huxley, MD;  Location: [place] CV LAB;  Service: Cardiovascular;  Laterality: N/A;   VISCERAL ARTERY INTERVENTION N/A 03/18/2017   Procedure: Visceral Artery Intervention;  Surgeon: Algernon Huxley, MD;  Location: [place] CV LAB;  Service: Cardiovascular;  Laterality: N/A;    (Not in a hospital admission)  Social History   Socioeconomic History   Marital status: Widowed    Spouse name: Not on file   Number of children: Not on file   Years of education: Not on file   Highest education level: Not on file  Occupational History   Not on file  Tobacco Use   Smoking status: Never   Smokeless tobacco: Never  Vaping Use   Vaping Use: Never used  Substance and Sexual Activity   Alcohol use: Never   Drug use: Never   Sexual activity: Not Currently  Other  Topics Concern   Not on file  Social History Narrative   Lives alone.    Daughter helps her.    Social Determinants of Health   Financial Resource Strain: Not on file  Food Insecurity: Not on file  Transportation Needs: Not on file  Physical Activity: Not on file  Stress: Not on file  Social Connections: Not on file  Intimate Partner Violence: Not on file    Family History  Problem Relation Age of Onset   Stroke Mother    Hypertension Mother    Heart disease Mother    Cancer Father    Heart disease Father    Heart attack Sister    Diabetes Sister    Heart attack Brother    Thyroid cancer Daughter    Cancer - Colon Daughter       Review of systems complete and found to be negative unless listed above      PHYSICAL EXAM  General: Well developed, well nourished, in no acute distress HEENT:  Normocephalic and atramatic Neck:  No JVD.  Lungs: Clear bilaterally  to auscultation and percussion. Heart: HRRR . Normal S1 and S2 without gallops or murmurs.  Abdomen: Bowel sounds are positive, abdomen soft and non-tender  Msk:  Back normal, normal gait. Normal strength and tone for age. Extremities: No clubbing, cyanosis or edema.   Neuro: Alert and oriented X 3. Psych:  Good affect, responds appropriately  Labs:   Lab Results  Component Value Date   WBC 7.3 09/21/2021   HGB 12.2 09/21/2021   HCT 39.6 09/21/2021   MCV 94.1 09/21/2021   PLT 269 09/21/2021    Recent Labs  Lab 09/22/21 0646  NA 139  K 4.6  CL 103  CO2 30  BUN 77*  CREATININE 1.21*  CALCIUM 8.6*  GLUCOSE 100*   Lab Results  Component Value Date   TROPONINI <0.03 06/23/2018    Lab Results  Component Value Date   CHOL 178 12/18/2020   CHOL 178 11/23/2019   Lab Results  Component Value Date   HDL 56 12/18/2020   HDL 67 11/23/2019   Lab Results  Component Value Date   LDLCALC 112 (H) 12/18/2020   LDLCALC 101 (H) 11/23/2019   Lab Results  Component Value Date   TRIG 51 12/18/2020    TRIG 50 11/23/2019   Lab Results  Component Value Date   CHOLHDL 3.2 12/18/2020   CHOLHDL 2.7 11/23/2019   No results found for: LDLDIRECT    Radiology: PERIPHERAL VASCULAR CATHETERIZATION  Result Date: 09/18/2021 See surgical note for result.  DG Chest Portable 1 View  Result Date: 09/21/2021 CLINICAL DATA:  Dyspnea EXAM: PORTABLE CHEST 1 VIEW COMPARISON:  08/07/2021 FINDINGS: The lungs are symmetrically expanded. Moderate interstitial pulmonary infiltrate and small bilateral pleural effusions persist in keeping with moderate cardiogenic failure. This appears progressive since prior examination. Cardiomegaly is again noted, unchanged. No pneumothorax. No acute bone abnormality. IMPRESSION: Stable cardiomegaly.  Moderate, progressive cardiogenic failure. Electronically Signed   By: Fidela Salisbury M.D.   On: 09/21/2021 22:13   VAS [place] ABI WITH/WO TBI  Result Date: 09/18/2021  LOWER EXTREMITY DOPPLER STUDY Patient Name:  ALETHA ALLEBACH Day  Date of Exam:   09/12/2021 Medical Rec #: [id number]         Accession #:    [id number] Date of Birth: November 02, 1928        Patient Gender: F Patient Age:   49 years Exam Location:  [facility] Procedure:      VAS [place] ABI WITH/WO TBI Referring Phys: --------------------------------------------------------------------------------  Indications: Peripheral artery disease.  Comparison Study: 06/2021 Performing Technologist: Concha Norway RVT  Examination Guidelines: A complete evaluation includes at minimum, Doppler waveform signals and systolic blood pressure reading at the level of bilateral brachial, anterior tibial, and posterior tibial arteries, when vessel segments are accessible. Bilateral testing is considered an integral part of a complete examination. Photoelectric Plethysmograph (PPG) waveforms and toe systolic pressure readings are included as required and additional duplex testing as needed. Limited examinations for reoccurring indications may  be performed as noted.  ABI Findings: +---------+------------------+-----+--------+--------+  Right     Rt Pressure (mmHg) Index Waveform Comment   +---------+------------------+-----+--------+--------+  Brachial  129                                         +---------+------------------+-----+--------+--------+  ATA  biphasic NonComp   +---------+------------------+-----+--------+--------+  PTA                                biphasic NonComp   +---------+------------------+-----+--------+--------+  Great Toe 41                 0.32  Normal             +---------+------------------+-----+--------+--------+ +---------+------------------+-----+----------+-------+  Left      Lt Pressure (mmHg) Index Waveform   Comment  +---------+------------------+-----+----------+-------+  Brachial  129                                          +---------+------------------+-----+----------+-------+  ATA       129                      monophasic          +---------+------------------+-----+----------+-------+  PTA       137                1.06  biphasic            +---------+------------------+-----+----------+-------+  Great Toe 17                 0.13  Abnormal            +---------+------------------+-----+----------+-------+ +-------+-----------+-----------+------------+------------+  ABI/TBI Today's ABI Today's TBI Previous ABI Previous TBI  +-------+-----------+-----------+------------+------------+  Right   NonComp     .32         NonComp      .52           +-------+-----------+-----------+------------+------------+  Left    1.06        .13         .77          .40           +-------+-----------+-----------+------------+------------+ TOES Findings: +----------+---------------+--------+-------+  Right Toes Pressure (mmHg) Waveform Comment  +----------+---------------+--------+-------+  1st Digit                  Normal            +----------+---------------+--------+-------+  2nd Digit                   Abnormal          +----------+---------------+--------+-------+  3rd Digit                  Abnormal          +----------+---------------+--------+-------+  4th Digit                  Abnormal          +----------+---------------+--------+-------+  5th Digit                  Abnormal          +----------+---------------+--------+-------+  +---------+---------------+--------+-------+  Left Toes Pressure (mmHg) Waveform Comment  +---------+---------------+--------+-------+  1st Digit                 Abnormal          +---------+---------------+--------+-------+  2nd Digit                 Abnormal          +---------+---------------+--------+-------+  3rd Digit  Abnormal          +---------+---------------+--------+-------+  4th Digit                 Abnormal          +---------+---------------+--------+-------+  5th Digit                 Abnormal          +---------+---------------+--------+-------+   Left ABIs appear increased compared to prior study on 06/2021. Bilateral TBIs appear decreased.  Summary: Right: Resting right ankle-brachial index indicates noncompressible right lower extremity arteries. The right toe-brachial index is abnormal. Abnormal waveforms in 2nd-fifth toes. Left: Resting left ankle-brachial index is within normal range. No evidence of significant left lower extremity arterial disease. The left toe-brachial index is abnormal. Abnormal waveforms in all toes.  *See table(s) above for measurements and observations.  Electronically signed by [name] MD on 09/18/2021 at 12:42:19 PM.    Final     EKG: Normal sinus rhythm around 90 interventricular conduction delay  ASSESSMENT AND PLAN:  Acute on chronic systolic and diastolic congestive heart failure Elevated troponin probably demand ischemia Acute hypoxic respiratory failure Chronic anemia Hypertension Diabetes type 2 Chronic renal sufficiency stage III Possible CVA right-sided hemiparesis Esophageal stricture with  gastrostomy tube Generalized weakness . Plan Agree with admit to progressive care Continue diuretics supplemental oxygen as necessary for heart failure Continue BiPAP as necessary Monitor renal insufficiency maintain adequate hydration follow-up nephrology Consider physical therapy for generalized weakness Continue hypertension management and control Maintain diabetes management and control Generalized chronic anemia continue iron transfusions consider As necessary for significant anemia Recommend conservative cardiac input no invasive procedures planned or recommended    Signed: Yolonda Kida MD 09/22/2021, 2:26 PM

## 2021-09-22 NOTE — Progress Notes (Signed)
Spent time with patient and daughter bedside providing presence and support.

## 2021-09-22 NOTE — ED Notes (Signed)
Pts family stated that the patient is unable to tolerate Osmolite tube feeds - Per family the patient typically uses [facility] tube feeds which is unavailable at this facility - discussed with provider who agreed that [facility] is not available.

## 2021-09-22 NOTE — ED Notes (Signed)
Report received from [place], [place]

## 2021-09-22 NOTE — ED Notes (Signed)
MD at bedside. Asked to removed BIPAP. Also advised family can bring the pt own tube feed in from home and is NOT TO BE CHARGED.

## 2021-09-22 NOTE — Evaluation (Signed)
Occupational Therapy Evaluation Patient Details Name: [name] Day MRN: [id number] DOB: [birth year] Today's Date: 09/22/2021   History of Present Illness Pt is a 87 y/o F admitted on 09/21/21 with c/c of SOB. Pt is being treated for acute on chronic combined systolic & diastolic CHF. PMH: HTN, DM, CVA, gastrostomy 2/2 esophageal stricture, combined CHF, CKD 3A, aortic stenosis   Clinical Impression   Patient presenting with decreased Ind in self care, balance, functional mobility/transfers, endurance, and safety awareness. Patient lives at home with daugthers to provide assist as needed. Pt reports performing self care tasks independently at baseline with use of rollator.  Patient currently needing supervision for mobility, functional transfers, and self care tasks with daughter's reporting pt looks close to baseline. Patient will benefit from acute OT to increase overall independence in the areas of ADLs, functional mobility,and safety awareness  in order to safely discharge home with family.      Recommendations for follow up therapy are one component of a multi-disciplinary discharge planning process, led by the attending physician.  Recommendations may be updated based on patient status, additional functional criteria and insurance authorization.   Follow Up Recommendations  No OT follow up    Assistance Recommended at Discharge Intermittent Supervision/Assistance  Functional Status Assessment  Patient has had a recent decline in their functional status and demonstrates the ability to make significant improvements in function in a reasonable and predictable amount of time.  Equipment Recommendations  None recommended by OT       Precautions / Restrictions Precautions Precautions: Fall      Mobility Bed Mobility Overal bed mobility: Needs Assistance Bed Mobility: Supine to Sit     Supine to sit: Min assist     General bed mobility comments: PT provides hand support, pt  reports she needs assistance for bed mobility in this setting due to bed being higher than hers at home.    Transfers Overall transfer level: Needs assistance Equipment used: Rolling walker (2 wheels) Transfers: Sit to/from Stand Sit to Stand: Supervision                  Balance Overall balance assessment: Needs assistance Sitting-balance support: Bilateral upper extremity supported;Feet unsupported Sitting balance-Leahy Scale: Good     Standing balance support: Bilateral upper extremity supported;During functional activity Standing balance-Leahy Scale: Fair                             ADL either performed or assessed with clinical judgement   ADL Overall ADL's : Needs assistance/impaired                                       General ADL Comments: supervision overall with RW     Vision Patient Visual Report: No change from baseline              Pertinent Vitals/Pain Pain Assessment: No/denies pain     Hand Dominance Right   Extremity/Trunk Assessment Upper Extremity Assessment Upper Extremity Assessment: Overall WFL for tasks assessed;Generalized weakness (residual R weakness from prior CVA but functional)   Lower Extremity Assessment Lower Extremity Assessment: Overall WFL for tasks assessed;Generalized weakness (residual R weakness from prior CVA but functional)   Cervical / Trunk Assessment Cervical / Trunk Assessment: Kyphotic   Communication Communication Communication: Expressive difficulties   Cognition Arousal/Alertness: Awake/alert Behavior During  Therapy: WFL for tasks assessed/performed Overall Cognitive Status: Within Functional Limits for tasks assessed                                                  Home Living Family/patient expects to be discharged to:: Private residence Living Arrangements: Children Available Help at Discharge: Family;Available 24 hours/day Type of Home:  House Home Access: Ramped entrance     Home Layout: One level     Bathroom Shower/Tub: Tub/shower unit;Sponge bathes at baseline   Bathroom Toilet: Handicapped height Bathroom Accessibility: Yes How Accessible: Accessible via walker Home Equipment: Lynn (2 wheels);Rollator (4 wheels);Toilet riser;Cane - single point   Additional Comments: Pt had been living at home alone with only occasional check-ins until earlier this year s/p CVA. Now receives 24/7 supervision      Prior Functioning/Environment Prior Level of Function : Needs assist             Mobility Comments: Pt reports she did not require physical assistance for bed mobility, transfers or gait in the home with the use of a rollator prior to admission. ADLs Comments: Pt is independent with ADLs. Family assists with IADLs        OT Problem List: Decreased strength;Decreased activity tolerance;Impaired balance (sitting and/or standing);Decreased safety awareness      OT Treatment/Interventions: Self-care/ADL training;Therapeutic exercise;Therapeutic activities;Energy conservation;DME and/or AE instruction;Patient/family education;Manual therapy;Balance training    OT Goals(Current goals can be found in the care plan section) Acute Rehab OT Goals Patient Stated Goal: to go home OT Goal Formulation: With patient/family Time For Goal Achievement: 10/06/21 Potential to Achieve Goals: Fair ADL Goals Pt Will Perform Grooming: with modified independence;standing Pt Will Perform Lower Body Dressing: with modified independence;sit to/from stand Pt Will Transfer to Toilet: with modified independence  OT Frequency: Min 2X/week   Barriers to D/C:    none known at this time          AM-PAC OT "6 Clicks" Daily Activity     Outcome Measure Help from another person eating meals?: None Help from another person taking care of personal grooming?: None Help from another person toileting, which includes using  toliet, bedpan, or urinal?: A Little Help from another person bathing (including washing, rinsing, drying)?: A Little Help from another person to put on and taking off regular upper body clothing?: None Help from another person to put on and taking off regular lower body clothing?: A Little 6 Click Score: 21   End of Session Equipment Utilized During Treatment: Rolling walker (2 wheels) Nurse Communication: Mobility status  Activity Tolerance: Patient tolerated treatment well Patient left: in bed;with call bell/phone within reach;with bed alarm set;with family/visitor present  OT Visit Diagnosis: Unsteadiness on feet (R26.81);Muscle weakness (generalized) (M62.81)                Time: 5947-0761 OT Time Calculation (min): 18 min Charges:  OT General Charges $OT Visit: 1 Visit OT Evaluation $OT Eval Moderate Complexity: 1 Mod OT Treatments $Self Care/Home Management : 8-22 mins  Darleen Crocker, MS, OTR/L , CBIS ascom [phone]  09/22/21, 3:28 PM

## 2021-09-22 NOTE — ED Notes (Signed)
Pt and family are refusing the hospital tube feed due to it not being the correct brand.

## 2021-09-22 NOTE — Progress Notes (Signed)
Initial Nutrition Assessment  DOCUMENTATION CODES:   Not applicable  INTERVENTION:   -D/c Osmolite  Initiate bolus feedings:     325 ml (1 carton) Dillard Essex Standard 1.4 QID via PEG   75 ml free water flush before and after each feeding administration   Tube feeding regimen provides 1820 kcal (100% of needs), 80 grams of protein, and 936 ml of H2O. Total free water: 1536 ml daily  NUTRITION DIAGNOSIS:   Inadequate oral intake related to inability to eat as evidenced by NPO status.  GOAL:   Patient will meet greater than or equal to 90% of their needs  MONITOR:   Labs, Weight trends, TF tolerance, Skin, I & O's  REASON FOR ASSESSMENT:   Consult Enteral/tube feeding initiation and management  ASSESSMENT:   [name] is a 87 y.o. female with medical history significant for HTN, DM, CVA stroke, gastrostomy secondary to esophageal stricture, combined CHF, CKD 3a and aortic stenosis who presents with shortness of breath that started on the [name] of arrival.  History is provided by daughter at the bedside due to patient's clinical condition.  It was associated with anterior chest which resolved by arrival.  Patient has not otherwise been ill and daughter denies any nausea, vomiting, fever or chills, abdominal pain or diarrhea.  Patient did have an episode of shortness of breath a few days prior and family gave patient an extra dose of Lasix and she was doing well until today.  On arrival of EMS O2 sat was 86% on room air improving to 100% on 8 L NRB.  Pt admitted with acute on chronic CHF.   Pt unavailable at time of visit. RD unable to obtain further nutrition-related history or complete nutrition-focused physical exam at this time.    Pt very familiar to RD team due to multiple prior hospitalizations. Pt is PEG dependent and uses Dillard Essex at home (per previous RD notes, pt with intolerance to Osmolite and Jevity formulas). Home regimen is usually "two big feedings" per  [name] and 1/2 a carton 1-2 times per [name] to supplement. She sometimes consumes food for pleasure, but is currently NPO. Pt currently has Osmolite ordered, but is refusing due to history of poor tolerance.   Reviewed wt hx; pt wt has been stable over the past 2 months.   Noted pt with history of moderate malnutrition in the context of chronic illness. RD suspects that malnutrition is ongoing, however, unable to identify at this time.   Medications reviewed and include lasix.   Labs reviewed: CBGS: [id number] (inpatient orders for glycemic control are 0-9 units insulin aspart every 4 hours).    Diet Order:   Diet Order     None       EDUCATION NEEDS:   No education needs have been identified at this time  Skin:  Skin Assessment: Skin Integrity Issues: Skin Integrity Issues:: Stage I Stage I: sacrum  Last BM:  Unknown  Height:   Ht Readings from Last 1 Encounters:  09/21/21 5\' 5"  (1.651 m)    Weight:   Wt Readings from Last 1 Encounters:  09/21/21 54 kg    Ideal Body Weight:  56.8 kg  BMI:  Body mass index is 19.8 kg/m.  Estimated Nutritional Needs:   Kcal:  1500-1700  Protein:  75-90 grams  Fluid:  > 1.5 L    Loistine Chance, RD, LDN, [place] Registered Dietitian II Certified Diabetes Care and Education Specialist Please refer to [facility] for  RD and/or RD on-call/weekend/after hours pager

## 2021-09-22 NOTE — ED Notes (Signed)
Pt complaint of pain in her foot.

## 2021-09-22 NOTE — Evaluation (Signed)
Physical Therapy Evaluation Patient Details Name: [name] Day MRN: [id number] DOB: 26-Aug-1929 Today's Date: 09/22/2021  History of Present Illness  Pt is a 87 y/o F admitted on 09/21/21 with c/c of SOB. Pt is being treated for acute on chronic combined systolic & diastolic CHF. PMH: HTN, DM, CVA, gastrostomy 2/2 esophageal stricture, combined CHF, CKD 3A, aortic stenosis  Clinical Impression  Pt received in bed & agreeable to tx. Pt reports she's mod I with rollator prior to admission & her daughters provide 24 hr supervision. On this date, pt requires min assist for supine>sit (pt holding to PT's hand to pull semifowler to sit) as pt reports her bed at home is lower than this one. Pt requires CGA for sit>stand & CGA fade to supervision for gait in room & hallway. Pt's SpO2 drops to as low as 85% on room air during session but is able to recover >/= 90% with cuing for pursed lip breathing. Will continue to follow pt acutely but do not anticipate pt will require any f/u upon return home.        Recommendations for follow up therapy are one component of a multi-disciplinary discharge planning process, led by the attending physician.  Recommendations may be updated based on patient status, additional functional criteria and insurance authorization.  Follow Up Recommendations No PT follow up    Assistance Recommended at Discharge Frequent or constant Supervision/Assistance  Functional Status Assessment  (minimal decline)  Equipment Recommendations  None recommended by PT    Recommendations for Other Services       Precautions / Restrictions Precautions Precautions: Fall Restrictions Weight Bearing Restrictions: No      Mobility  Bed Mobility Overal bed mobility: Needs Assistance Bed Mobility: Supine to Sit     Supine to sit: Min assist;HOB elevated     General bed mobility comments: PT provides hand support, pt reports she needs assistance for bed mobility in this setting  due to bed being higher than hers at home.    Transfers Overall transfer level: Needs assistance Equipment used: Rolling walker (2 wheels) Transfers: Sit to/from Stand Sit to Stand: Min guard           General transfer comment: Pt transfers to standing with BUE support on RW (pt is used to using rollator at home)    Ambulation/Gait Ambulation/Gait assistance: Min guard;Supervision (CGA fade to close supervision) Gait Distance (Feet): 80 Feet Assistive device: Rolling walker (2 wheels) Gait Pattern/deviations: Decreased step length - right;Decreased step length - left;Decreased stride length Gait velocity: decreased        Stairs            Wheelchair Mobility    Modified Rankin (Stroke Patients Only)       Balance Overall balance assessment: Needs assistance Sitting-balance support: Bilateral upper extremity supported;Feet unsupported Sitting balance-Leahy Scale: Good     Standing balance support: Bilateral upper extremity supported;During functional activity Standing balance-Leahy Scale: Fair                               Pertinent Vitals/Pain Pain Assessment: Faces Faces Pain Scale: Hurts a little bit Pain Location: middle toe L foot (pt reports she is s/p procedure this past Thursday) Pain Descriptors / Indicators: Discomfort Pain Intervention(s): Monitored during session (notified nurse)    Home Living Family/patient expects to be discharged to:: Private residence Living Arrangements: Children Available Help at Discharge: Family;Available 24 hours/day Type  of Home: House Home Access: Ramped entrance       Home Layout: One level Home Equipment: Conservation officer, nature (2 wheels);Rollator (4 wheels);Toilet riser;Cane - single point      Prior Function               Mobility Comments: Pt reports she did not require physical assistance for bed mobility, transfers or gait in the home with the use of a rollator prior to admission.        Hand Dominance        Extremity/Trunk Assessment   Upper Extremity Assessment Upper Extremity Assessment:  (Pt reports her RUE is "paralyzed" but pt able to functionally hold to RW during session, able to place it on RW without assistance, not formally tested)    Lower Extremity Assessment Lower Extremity Assessment: Generalized weakness (hx of CVA with residual R hemiparesis but strength not formally tested)    Cervical / Trunk Assessment Cervical / Trunk Assessment: Kyphotic  Communication   Communication: Expressive difficulties (Pt reports "I can't talk, I've had a stroke" but PT able to clearly understand pt during session, pt does speak at a slower rate.)  Cognition Arousal/Alertness: Awake/alert Behavior During Therapy: WFL for tasks assessed/performed Overall Cognitive Status: Within Functional Limits for tasks assessed                                          General Comments General comments (skin integrity, edema, etc.): Pt on room air with lowest SpO2 of 85% but pt able to quickly recover to >/= 90% with cuing for pursed lip breathing, pt denies SOB. Max HR noted during gait 115 bpm.    Exercises     Assessment/Plan    PT Assessment Patient needs continued PT services  PT Problem List Decreased strength;Cardiopulmonary status limiting activity;Decreased activity tolerance;Decreased balance;Decreased mobility;Decreased knowledge of use of DME       PT Treatment Interventions DME instruction;Therapeutic exercise;Gait training;Balance training;Neuromuscular re-education;Functional mobility training;Therapeutic activities;Patient/family education    PT Goals (Current goals can be found in the Care Plan section)  Acute Rehab PT Goals Patient Stated Goal: go home PT Goal Formulation: With patient Time For Goal Achievement: 10/06/21 Potential to Achieve Goals: Good    Frequency Min 2X/week   Barriers to discharge        Co-evaluation                AM-PAC PT "6 Clicks" Mobility  Outcome Measure Help needed turning from your back to your side while in a flat bed without using bedrails?: None Help needed moving from lying on your back to sitting on the side of a flat bed without using bedrails?: A Little Help needed moving to and from a bed to a chair (including a wheelchair)?: A Little Help needed standing up from a chair using your arms (e.g., wheelchair or bedside chair)?: A Little Help needed to walk in hospital room?: A Little Help needed climbing 3-5 steps with a railing? : A Lot 6 Click Score: 18    End of Session   Activity Tolerance: Patient tolerated treatment well Patient left: in chair;with call bell/phone within reach Nurse Communication: Mobility status (O2) PT Visit Diagnosis: Muscle weakness (generalized) (M62.81);Unsteadiness on feet (R26.81)    Time: 9233-0076 PT Time Calculation (min) (ACUTE ONLY): 13 min   Charges:   PT Evaluation $PT Eval Moderate Complexity: 1 Mod  Lavone Nian, PT, DPT 09/22/21, 10:52 AM   Waunita Schooner 09/22/2021, 10:50 AM

## 2021-09-22 NOTE — Progress Notes (Signed)
PROGRESS NOTE    [name] Day  VOZ:[id number] DOB: 09-Feb-1929 DOA: 09/21/2021 PCP: [name], MD    Brief Narrative:  87 year old female with history of hypertension, type 2 diabetes, stroke, gastrostomy secondary to esophageal stricture, combined congestive heart failure, stage IIIa CKD and aortic stenosis, recent lower extremity angiogram and stenting presents with worsening shortness of breath.  Patient underwent lower extremity angiogram and contrast studies on 12/19.  Initial arrival, oxygen 86%, started on nonrebreather and brought to the ER.  Due to work of breathing, she was started on BiPAP in the ER.   Assessment & Plan:   Principal Problem:   Acute on chronic combined systolic and diastolic CHF (congestive heart failure) (HCC) Active Problems:   Esophageal stenosis   Type 2 diabetes mellitus without complication, without long-term current use of insulin (HCC)   CHF exacerbation (HCC)   Gastrostomy tube in place [facility])   Acute respiratory failure with hypoxia (HCC)   Essential hypertension   AKI (acute kidney injury) ([place])   Elevated troponin   Stage 3a chronic kidney disease (HCC)  Acute on chronic combined systolic and diastolic congestive heart failure: Acute hypoxemic respiratory failure: Recent echocardiogram with known ejection fraction 40 to 45% and grade 2 diastolic dysfunction.  Probably aggravated by recent use of contrast for lower extremity angiogram. Continue IV Lasix, oral lisinopril, spironolactone and carvedilol.  Intake and output monitoring. Keep on oxygen to keep saturations more than 89%.  Can use BiPAP as needed for respiratory distress. Mild elevated troponins possibly related to CHF exacerbation. Patient on dual antiplatelet therapy with aspirin and Plavix with recent lower extremity stents. Cardiology consulted.  Essential hypertension: Blood pressures stabilized now on lisinopril and carvedilol.  Type 2 diabetes mellitus with  hyperlipidemia: On sliding scale insulin.  On a statin.  Esophageal stricture, dysphagia and gastrostomy: Resume tube feeding.  Patient cannot tolerate any tube feeding from the hospital.  Family will bring their own to feeding formula Dillard Essex.  CKD stage IIIa: At about baseline.  Monitor on diuretic therapy.  Recheck electrolytes tomorrow morning.   DVT prophylaxis: enoxaparin (LOVENOX) injection 30 mg Start: 09/21/21 2330   Code Status: DNR Family Communication: Patient's daughter [name] at bedside. Disposition Plan: Status is: Inpatient  Remains inpatient appropriate because: Significant shortness of breath.  CHF exacerbation.    Consultants:  Cardiology, [facility] consulted  Procedures:  None  Antimicrobials:  None   Subjective: Patient seen and examined in the emergency room.  She is still waiting for inpatient bed assignment.  On my initial assessment, she was on BiPAP, hard of hearing but she advised [place] to take the BiPAP off.  Patient was taken off BiPAP and she was able to keep up some conversation.  She tells me that her shortness of breath is much better today.  Recently had angiogram but still has some pain on the left foot. Patient's daughter at the bedside who thinks patient is looking slightly better today than last night. They are wanting to bring their own feeding formula because patient cannot tolerate any other formulas.  We will approve this and patient should not be charged for this.  Objective: Vitals:   09/22/21 0930 09/22/21 1237 09/22/21 1245 09/22/21 1300  BP: (!) 132/58  (!) 141/72 (!) 138/91  Pulse: 65 69 70 67  Resp: 15 17 (!) 22 (!) 28  Temp:      TempSrc:      SpO2: 92% 94% 96% 94%  Weight:  Height:        Intake/Output Summary (Last 24 hours) at 09/22/2021 1353 Last data filed at 09/22/2021 1029 Gross per 24 hour  Intake --  Output 1200 ml  Net -1200 ml   Filed Weights   09/21/21 2147  Weight: 54 kg     Examination:  General exam: Appears calm and comfortable  Frail and debilitated.  Chronically sick looking but not in any distress. Taken off BiPAP and maintaining saturations on room air. On mobility, her oxygen saturation was 86% and she was put back on oxygen. Respiratory system: Mostly clear.  No added sounds. Cardiovascular system: S1 & S2 heard, RRR.  Chronic ischemic changes on the foot.  No evidence of ischemia or gangrene. Gastrointestinal system: Soft.  Nondistended.  PEG tube in place.   Central nervous system: Alert and oriented. No focal neurological deficits.  Flat affect.   Data Reviewed: I have personally reviewed following labs and imaging studies  CBC: Recent Labs  Lab 09/21/21 2155  WBC 7.3  NEUTROABS 4.9  HGB 12.2  HCT 39.6  MCV 94.1  PLT 220   Basic Metabolic Panel: Recent Labs  Lab 09/18/21 1011 09/21/21 2155 09/22/21 0646  NA  --  138 139  K  --  6.1* 4.6  CL  --  105 103  CO2  --  25 30  GLUCOSE  --  253* 100*  BUN 64* 70* 77*  CREATININE 1.12* 1.19* 1.21*  CALCIUM  --  7.8* 8.6*   GFR: Estimated Creatinine Clearance: 26.3 mL/min (A) (by C-G formula based on SCr of 1.21 mg/dL (H)). Liver Function Tests: No results for input(s): AST, ALT, ALKPHOS, BILITOT, PROT, ALBUMIN in the last 168 hours. No results for input(s): LIPASE, AMYLASE in the last 168 hours. No results for input(s): AMMONIA in the last 168 hours. Coagulation Profile: No results for input(s): INR, PROTIME in the last 168 hours. Cardiac Enzymes: No results for input(s): CKTOTAL, CKMB, CKMBINDEX, TROPONINI in the last 168 hours. BNP (last 3 results) No results for input(s): PROBNP in the last 8760 hours. HbA1C: No results for input(s): HGBA1C in the last 72 hours. CBG: Recent Labs  Lab 09/18/21 1010 09/18/21 1256 09/22/21 0011 09/22/21 0411 09/22/21 1244  GLUCAP 174* 146* 294* 121* 188*   Lipid Profile: No results for input(s): CHOL, HDL, LDLCALC, TRIG, CHOLHDL,  LDLDIRECT in the last 72 hours. Thyroid Function Tests: No results for input(s): TSH, T4TOTAL, FREET4, T3FREE, THYROIDAB in the last 72 hours. Anemia Panel: No results for input(s): VITAMINB12, FOLATE, FERRITIN, TIBC, IRON, RETICCTPCT in the last 72 hours. Sepsis Labs: No results for input(s): PROCALCITON, LATICACIDVEN in the last 168 hours.  Recent Results (from the past 240 hour(s))  Resp Panel by RT-PCR (Flu A&B, Covid) Nasopharyngeal Swab     Status: None   Collection Time: 09/21/21  9:55 PM   Specimen: Nasopharyngeal Swab; Nasopharyngeal(NP) swabs in vial transport medium  Result Value Ref Range Status   SARS Coronavirus 2 by RT PCR NEGATIVE NEGATIVE Final    Comment: (NOTE) SARS-CoV-2 target nucleic acids are NOT DETECTED.  The SARS-CoV-2 RNA is generally detectable in upper respiratory specimens during the acute phase of infection. The lowest concentration of SARS-CoV-2 viral copies this assay can detect is 138 copies/mL. A negative result does not preclude SARS-Cov-2 infection and should not be used as the sole basis for treatment or other patient management decisions. A negative result may occur with  improper specimen collection/handling, submission of specimen other than nasopharyngeal  swab, presence of viral mutation(s) within the areas targeted by this assay, and inadequate number of viral copies(<138 copies/mL). A negative result must be combined with clinical observations, patient history, and epidemiological information. The expected result is Negative.  Fact Sheet for Patients:  EntrepreneurPulse.com.au  Fact Sheet for Healthcare Providers:  IncredibleEmployment.be  This test is no t yet approved or cleared by the [place] FDA and  has been authorized for detection and/or diagnosis of SARS-CoV-2 by FDA under an Emergency Use Authorization (EUA). This EUA will remain  in effect (meaning this test can be used) for the  duration of the COVID-19 declaration under Section 564(b)(1) of the Act, 21 U.S.C.section 360bbb-3(b)(1), unless the authorization is terminated  or revoked sooner.       Influenza A by PCR NEGATIVE NEGATIVE Final   Influenza B by PCR NEGATIVE NEGATIVE Final    Comment: (NOTE) The Xpert Xpress SARS-CoV-2/FLU/RSV plus assay is intended as an aid in the diagnosis of influenza from Nasopharyngeal swab specimens and should not be used as a sole basis for treatment. Nasal washings and aspirates are unacceptable for Xpert Xpress SARS-CoV-2/FLU/RSV testing.  Fact Sheet for Patients: EntrepreneurPulse.com.au  Fact Sheet for Healthcare Providers: IncredibleEmployment.be  This test is not yet approved or cleared by the [place] FDA and has been authorized for detection and/or diagnosis of SARS-CoV-2 by FDA under an Emergency Use Authorization (EUA). This EUA will remain in effect (meaning this test can be used) for the duration of the COVID-19 declaration under Section 564(b)(1) of the Act, 21 U.S.C. section 360bbb-3(b)(1), unless the authorization is terminated or revoked.  Performed at [facility], [address], [place], [place] [address]          Radiology Studies: DG Chest Portable 1 View  Result Date: 09/21/2021 CLINICAL DATA:  Dyspnea EXAM: PORTABLE CHEST 1 VIEW COMPARISON:  08/07/2021 FINDINGS: The lungs are symmetrically expanded. Moderate interstitial pulmonary infiltrate and small bilateral pleural effusions persist in keeping with moderate cardiogenic failure. This appears progressive since prior examination. Cardiomegaly is again noted, unchanged. No pneumothorax. No acute bone abnormality. IMPRESSION: Stable cardiomegaly.  Moderate, progressive cardiogenic failure. Electronically Signed   By: Fidela Salisbury M.D.   On: 09/21/2021 22:13        Scheduled Meds:  aspirin  324 mg Oral Once   carvedilol  3.125 mg Per  Tube BID WC   enoxaparin (LOVENOX) injection  30 mg Subcutaneous Q24H   feeding supplement (K[employer] STANDARD 1.4)  325 mL Per Tube QID   free water  150 mL Per Tube QID   furosemide  40 mg Intravenous BID   insulin aspart  0-9 Units Subcutaneous Q4H   lisinopril  2.5 mg Oral Daily   sodium chloride flush  3 mL Intravenous Q12H   Continuous Infusions:  sodium chloride       LOS: 1 day    Time spent: 30 minutes    Barb Merino, MD [facility] Pager [phone]

## 2021-09-23 ENCOUNTER — Encounter: Payer: Self-pay | Admitting: Internal Medicine

## 2021-09-23 ENCOUNTER — Other Ambulatory Visit: Payer: Self-pay

## 2021-09-23 DIAGNOSIS — I5043 Acute on chronic combined systolic (congestive) and diastolic (congestive) heart failure: Secondary | ICD-10-CM | POA: Diagnosis not present

## 2021-09-23 LAB — GLUCOSE, CAPILLARY
Glucose-Capillary: 260 mg/dL — ABNORMAL HIGH (ref 70–99)
Glucose-Capillary: 241 mg/dL — ABNORMAL HIGH (ref 70–99)

## 2021-09-23 LAB — BASIC METABOLIC PANEL
Anion gap: 8 (ref 5–15)
BUN: 65 mg/dL — ABNORMAL HIGH (ref 8–23)
CO2: 31 mmol/L (ref 22–32)
Calcium: 9.2 mg/dL (ref 8.9–10.3)
Chloride: 104 mmol/L (ref 98–111)
Creatinine, Ser: 1.06 mg/dL — ABNORMAL HIGH (ref 0.44–1.00)
GFR, Estimated: 50 mL/min — ABNORMAL LOW (ref >60)
Glucose, Bld: 127 mg/dL — ABNORMAL HIGH (ref 70–99)
Potassium: 3.9 mmol/L (ref 3.5–5.1)
Sodium: 143 mmol/L (ref 135–145)

## 2021-09-23 LAB — CBG MONITORING, ED
Glucose-Capillary: 188 mg/dL — ABNORMAL HIGH (ref 70–99)
Glucose-Capillary: 140 mg/dL — ABNORMAL HIGH (ref 70–99)

## 2021-09-23 LAB — PHOSPHORUS: Phosphorus: 4 mg/dL (ref 2.5–4.6)

## 2021-09-23 LAB — MAGNESIUM: Magnesium: 2.8 mg/dL — ABNORMAL HIGH (ref 1.7–2.4)

## 2021-09-23 MED ORDER — FUROSEMIDE 10 MG/ML IJ SOLN
40.0000 mg | Freq: Two times a day (BID) | INTRAMUSCULAR | Status: DC
  Administered 2021-09-23 – 2021-09-24 (×2): 40 mg via INTRAVENOUS
  Filled 2021-09-23 (×2): qty 4

## 2021-09-23 NOTE — ED Notes (Signed)
Pt ambulated on 4 liters with a 02 sat greater than 90%. Pt denied any feeling of SOB. Pt walked entire ER, RN notified provider.

## 2021-09-23 NOTE — ED Notes (Signed)
Pt resting at this time and does not want feeding or meds at this time

## 2021-09-23 NOTE — TOC Progression Note (Signed)
Transition of Care [facility]) - Progression Note    Patient Details  Name: [name] Day MRN: [id number] Date of Birth: [birth year]  [facility] CM/SW Contact  Eileen Stanford, LCSW Phone Number: 09/23/2021, 2:31 PM  Clinical Narrative:  Pt will remain in ED through the night. 02 has been ordered through [employer].          Expected Discharge Plan and Services           Expected Discharge Date: 09/23/21                                     Social Determinants of Health (SDOH) Interventions    Readmission Risk Interventions Readmission Risk Prevention Plan 11/24/2019  Transportation Screening Complete  PCP or Specialist Appt within 3-5 Days Complete  HRI or Home Care Consult Complete  Medication Review (RN Care Manager) Complete  Some recent data might be hidden

## 2021-09-23 NOTE — ED Notes (Signed)
[name] placed in [place] with pt label

## 2021-09-23 NOTE — Progress Notes (Signed)
PROGRESS NOTE    [name] Day  ZMO:[id number] DOB: 02-03-1929 DOA: 09/21/2021 PCP: [name], MD    Brief Narrative:  87 year old female with history of hypertension, type 2 diabetes, stroke, gastrostomy secondary to esophageal stricture, combined congestive heart failure, stage IIIa CKD and aortic stenosis, recent lower extremity angiogram and stenting presents with worsening shortness of breath.  Patient underwent lower extremity angiogram and contrast studies on 12/19.  Initial arrival, oxygen 86%, started on nonrebreather and brought to the ER.  Due to work of breathing, she was started on BiPAP in the ER.  12/24 nursing ambulated patient requiring 4 L to keep O2 sat above 90%   Assessment & Plan:   Principal Problem:   Acute on chronic combined systolic and diastolic CHF (congestive heart failure) (HCC) Active Problems:   Esophageal stenosis   Type 2 diabetes mellitus without complication, without long-term current use of insulin (HCC)   CHF exacerbation (HCC)   Gastrostomy tube in place [facility])   Acute respiratory failure with hypoxia (HCC)   Essential hypertension   AKI (acute kidney injury) ([place])   Elevated troponin   Stage 3a chronic kidney disease (HCC)  Acute on chronic combined systolic and diastolic congestive heart failure: Acute hypoxemic respiratory failure: Recent echocardiogram with known ejection fraction 40 to 45% and grade 2 diastolic dysfunction.  Probably aggravated by recent use of contrast for lower extremity angiogram. 12/24 BNP was elevated Currently requiring 4 L with ambulation Will continue IV Lasix I&D Cardiology following Clinically slowly improving   Essential hypertension:  Stable continue lisinopril and carvedilol     Type 2 diabetes mellitus with hyperlipidemia:  Overall stable  Continue sliding scale  On statin   Elevated troponin Likely due to demand ischemia   Esophageal stricture, dysphagia and gastrostomy:  Resume tube feeding.  Patient cannot tolerate any tube feeding from the hospital.  Family will bring their own to feeding formula [employer] [place].  CKD stage IIIa:  Overall mildly improved and at baseline  Continue to monitor while diuresing     DVT prophylaxis: enoxaparin (LOVENOX) injection 30 mg Start: 09/21/21 2330   Code Status: DNR Family Communication: Patient's daughter [name] at bedside. Disposition Plan: Status is: Inpatient  Remains inpatient appropriate because: Still requiring IV diuretics for CHF exacerbation .     Consultants:  Cardiology Procedures:  None  Antimicrobials:  None   Subjective: No shortness of breath at rest.  Does have DOE with O2 sats dropping with ambulation requiring 4 L.  No chest pain    Objective: Vitals:   09/23/21 0930 09/23/21 1000 09/23/21 1130 09/23/21 1430  BP: (!) 159/90 (!) 163/68 (!) 129/100 (!) 128/94  Pulse: 72 83 83 86  Resp: 19 (!) 21 (!) 23 20  Temp:      TempSrc:      SpO2: 98% 98% 99% 100%  Weight:      Height:        Intake/Output Summary (Last 24 hours) at 09/23/2021 1611 Last data filed at 09/23/2021 1110 Gross per 24 hour  Intake --  Output 1250 ml  Net -1250 ml   Filed Weights   09/21/21 2147  Weight: 54 kg    Examination: NAD, calm Decreased breath sounds at bases no wheezing Reg-irreg S1-S2 no gallops Soft benign positive bowel sounds No edema Awake oriented x3  Data Reviewed: I have personally reviewed following labs and imaging studies  CBC: Recent Labs  Lab 09/21/21 2155  WBC 7.3  NEUTROABS  4.9  HGB 12.2  HCT 39.6  MCV 94.1  PLT 262   Basic Metabolic Panel: Recent Labs  Lab 09/18/21 1011 09/21/21 2155 09/22/21 0646 09/23/21 0736  NA  --  138 139 143  K  --  6.1* 4.6 3.9  CL  --  105 103 104  CO2  --  25 30 31   GLUCOSE  --  253* 100* 127*  BUN 64* 70* 77* 65*  CREATININE 1.12* 1.19* 1.21* 1.06*  CALCIUM  --  7.8* 8.6* 9.2  MG  --   --   --  2.8*  PHOS  --    --   --  4.0   GFR: Estimated Creatinine Clearance: 30.1 mL/min (A) (by C-G formula based on SCr of 1.06 mg/dL (H)). Liver Function Tests: No results for input(s): AST, ALT, ALKPHOS, BILITOT, PROT, ALBUMIN in the last 168 hours. No results for input(s): LIPASE, AMYLASE in the last 168 hours. No results for input(s): AMMONIA in the last 168 hours. Coagulation Profile: No results for input(s): INR, PROTIME in the last 168 hours. Cardiac Enzymes: No results for input(s): CKTOTAL, CKMB, CKMBINDEX, TROPONINI in the last 168 hours. BNP (last 3 results) No results for input(s): PROBNP in the last 8760 hours. HbA1C: No results for input(s): HGBA1C in the last 72 hours. CBG: Recent Labs  Lab 09/22/21 1244 09/22/21 1702 09/22/21 2128 09/23/21 0228 09/23/21 0945  GLUCAP 188* 171* 198* 188* 140*   Lipid Profile: No results for input(s): CHOL, HDL, LDLCALC, TRIG, CHOLHDL, LDLDIRECT in the last 72 hours. Thyroid Function Tests: No results for input(s): TSH, T4TOTAL, FREET4, T3FREE, THYROIDAB in the last 72 hours. Anemia Panel: No results for input(s): VITAMINB12, FOLATE, FERRITIN, TIBC, IRON, RETICCTPCT in the last 72 hours. Sepsis Labs: No results for input(s): PROCALCITON, LATICACIDVEN in the last 168 hours.  Recent Results (from the past 240 hour(s))  Resp Panel by RT-PCR (Flu A&B, Covid) Nasopharyngeal Swab     Status: None   Collection Time: 09/21/21  9:55 PM   Specimen: Nasopharyngeal Swab; Nasopharyngeal(NP) swabs in vial transport medium  Result Value Ref Range Status   SARS Coronavirus 2 by RT PCR NEGATIVE NEGATIVE Final    Comment: (NOTE) SARS-CoV-2 target nucleic acids are NOT DETECTED.  The SARS-CoV-2 RNA is generally detectable in upper respiratory specimens during the acute phase of infection. The lowest concentration of SARS-CoV-2 viral copies this assay can detect is 138 copies/mL. A negative result does not preclude SARS-Cov-2 infection and should not be used as the  sole basis for treatment or other patient management decisions. A negative result may occur with  improper specimen collection/handling, submission of specimen other than nasopharyngeal swab, presence of viral mutation(s) within the areas targeted by this assay, and inadequate number of viral copies(<138 copies/mL). A negative result must be combined with clinical observations, patient history, and epidemiological information. The expected result is Negative.  Fact Sheet for Patients:  EntrepreneurPulse.com.au  Fact Sheet for Healthcare Providers:  IncredibleEmployment.be  This test is no t yet approved or cleared by the [place] FDA and  has been authorized for detection and/or diagnosis of SARS-CoV-2 by FDA under an Emergency Use Authorization (EUA). This EUA will remain  in effect (meaning this test can be used) for the duration of the COVID-19 declaration under Section 564(b)(1) of the Act, 21 U.S.C.section 360bbb-3(b)(1), unless the authorization is terminated  or revoked sooner.       Influenza A by PCR NEGATIVE NEGATIVE Final   Influenza B  by PCR NEGATIVE NEGATIVE Final    Comment: (NOTE) The Xpert Xpress SARS-CoV-2/FLU/RSV plus assay is intended as an aid in the diagnosis of influenza from Nasopharyngeal swab specimens and should not be used as a sole basis for treatment. Nasal washings and aspirates are unacceptable for Xpert Xpress SARS-CoV-2/FLU/RSV testing.  Fact Sheet for Patients: EntrepreneurPulse.com.au  Fact Sheet for Healthcare Providers: IncredibleEmployment.be  This test is not yet approved or cleared by the [place] FDA and has been authorized for detection and/or diagnosis of SARS-CoV-2 by FDA under an Emergency Use Authorization (EUA). This EUA will remain in effect (meaning this test can be used) for the duration of the COVID-19 declaration under Section 564(b)(1) of the  Act, 21 U.S.C. section 360bbb-3(b)(1), unless the authorization is terminated or revoked.  Performed at [facility], [address], [place], [place] [address]          Radiology Studies: DG Chest Portable 1 View  Result Date: 09/21/2021 CLINICAL DATA:  Dyspnea EXAM: PORTABLE CHEST 1 VIEW COMPARISON:  08/07/2021 FINDINGS: The lungs are symmetrically expanded. Moderate interstitial pulmonary infiltrate and small bilateral pleural effusions persist in keeping with moderate cardiogenic failure. This appears progressive since prior examination. Cardiomegaly is again noted, unchanged. No pneumothorax. No acute bone abnormality. IMPRESSION: Stable cardiomegaly.  Moderate, progressive cardiogenic failure. Electronically Signed   By: Fidela Salisbury M.D.   On: 09/21/2021 22:13        Scheduled Meds:  aspirin  324 mg Oral Once   carvedilol  3.125 mg Per Tube BID WC   enoxaparin (LOVENOX) injection  30 mg Subcutaneous Q24H   feeding supplement ([employer] STANDARD 1.4)  325 mL Per Tube QID   free water  150 mL Per Tube QID   furosemide  40 mg Intravenous Q12H   insulin aspart  0-9 Units Subcutaneous Q4H   lisinopril  2.5 mg Oral Daily   rOPINIRole  2 mg Per Tube BID   sodium chloride flush  3 mL Intravenous Q12H   Continuous Infusions:  sodium chloride       LOS: 2 days    Time spent: 35 minutes with more than 50% on COC    Nolberto Hanlon, MD [facility] Pager [phone]

## 2021-09-23 NOTE — Progress Notes (Signed)
[facility]    SUBJECTIVE: Patient states to feel reasonably well until she ambulates then she gets dyspneic and becomes profoundly hypoxic otherwise feels reasonably well no pain   Vitals:   09/23/21 0800 09/23/21 0830 09/23/21 0930 09/23/21 1000  BP: (!) 152/68 (!) 154/74 (!) 159/90 (!) 163/68  Pulse: 74 76 72 83  Resp: 20 (!) 21 19 (!) 21  Temp:      TempSrc:      SpO2: 99% 100% 98% 98%  Weight:      Height:         Intake/Output Summary (Last 24 hours) at 09/23/2021 1030 Last data filed at 09/23/2021 0246 Gross per 24 hour  Intake --  Output 600 ml  Net -600 ml      PHYSICAL EXAM  General: Well developed, well nourished, in no acute distress HEENT:  Normocephalic and atramatic Neck:  No JVD.  Lungs: Clear bilaterally to auscultation and percussion. Heart: HRRR . Normal S1 and S2 without gallops or murmurs.  Abdomen: Bowel sounds are positive, abdomen soft and non-tender  Msk:  Back normal, normal gait. Normal strength and tone for age. Extremities: No clubbing, cyanosis or edema.   Neuro: Alert and oriented X 3. Psych:  Good affect, responds appropriately   LABS: Basic Metabolic Panel: Recent Labs    09/22/21 0646 09/23/21 0736  NA 139 143  K 4.6 3.9  CL 103 104  CO2 30 31  GLUCOSE 100* 127*  BUN 77* 65*  CREATININE 1.21* 1.06*  CALCIUM 8.6* 9.2  MG  --  2.8*  PHOS  --  4.0   Liver Function Tests: No results for input(s): AST, ALT, ALKPHOS, BILITOT, PROT, ALBUMIN in the last 72 hours. No results for input(s): LIPASE, AMYLASE in the last 72 hours. CBC: Recent Labs    09/21/21 2155  WBC 7.3  NEUTROABS 4.9  HGB 12.2  HCT 39.6  MCV 94.1  PLT 269   Cardiac Enzymes: No results for input(s): CKTOTAL, CKMB, CKMBINDEX, TROPONINI in the last 72 hours. BNP: Invalid input(s): POCBNP D-Dimer: No results for input(s): DDIMER in the last 72 hours. Hemoglobin A1C: No results for input(s): HGBA1C in the last 72 hours. Fasting Lipid Panel: No  results for input(s): CHOL, HDL, LDLCALC, TRIG, CHOLHDL, LDLDIRECT in the last 72 hours. Thyroid Function Tests: No results for input(s): TSH, T4TOTAL, T3FREE, THYROIDAB in the last 72 hours.  Invalid input(s): FREET3 Anemia Panel: No results for input(s): VITAMINB12, FOLATE, FERRITIN, TIBC, IRON, RETICCTPCT in the last 72 hours.  DG Chest Portable 1 View  Result Date: 09/21/2021 CLINICAL DATA:  Dyspnea EXAM: PORTABLE CHEST 1 VIEW COMPARISON:  08/07/2021 FINDINGS: The lungs are symmetrically expanded. Moderate interstitial pulmonary infiltrate and small bilateral pleural effusions persist in keeping with moderate cardiogenic failure. This appears progressive since prior examination. Cardiomegaly is again noted, unchanged. No pneumothorax. No acute bone abnormality. IMPRESSION: Stable cardiomegaly.  Moderate, progressive cardiogenic failure. Electronically Signed   By: Fidela Salisbury M.D.   On: 09/21/2021 22:13     Echo mildly reduced left ventricular function EF around 40-45  TELEMETRY: Sinus rhythm at around 90:  ASSESSMENT AND PLAN:  Principal Problem:   Acute on chronic combined systolic and diastolic CHF (congestive heart failure) (HCC) Active Problems:   Esophageal stenosis   Type 2 diabetes mellitus without complication, without long-term current use of insulin (HCC)   CHF exacerbation ([place])   Gastrostomy tube in place [facility])   Acute respiratory failure with hypoxia ([place])  Essential hypertension   AKI (acute kidney injury) ([place])   Elevated troponin   Stage 3a chronic kidney disease (HCC)    Plan Improving acute respiratory failure was still hypoxic on activity Continue diuresis supplemental oxygen as necessary Recommend inhalers to help with respiratory failure Maintain hypertension management control Diuretic therapy for heart failure Continue to follow renal function for renal insufficiency Agree with diabetes management and control Recommend temporary course of home  IV oxygen therapy if necessary we will continue diuresis and heart failure management   Yolonda Kida, MD 09/23/2021 10:30 AM

## 2021-09-23 NOTE — ED Notes (Signed)
Incentive spirometry given to pt and educated pt on how to use it.

## 2021-09-23 NOTE — ED Notes (Signed)
RN ambulated pt without oxygen. Pt walked 68ft from the bed to the hallway and her oxygen levels went from 99% RA while resting to 87% walking. RN notify provider.

## 2021-09-24 DIAGNOSIS — I5043 Acute on chronic combined systolic (congestive) and diastolic (congestive) heart failure: Secondary | ICD-10-CM | POA: Diagnosis not present

## 2021-09-24 LAB — BASIC METABOLIC PANEL
Anion gap: 10 (ref 5–15)
BUN: 62 mg/dL — ABNORMAL HIGH (ref 8–23)
CO2: 30 mmol/L (ref 22–32)
Calcium: 9.1 mg/dL (ref 8.9–10.3)
Chloride: 102 mmol/L (ref 98–111)
Creatinine, Ser: 1.28 mg/dL — ABNORMAL HIGH (ref 0.44–1.00)
GFR, Estimated: 40 mL/min — ABNORMAL LOW (ref >60)
Glucose, Bld: 173 mg/dL — ABNORMAL HIGH (ref 70–99)
Potassium: 3.9 mmol/L (ref 3.5–5.1)
Sodium: 142 mmol/L (ref 135–145)

## 2021-09-24 LAB — GLUCOSE, CAPILLARY
Glucose-Capillary: 105 mg/dL — ABNORMAL HIGH (ref 70–99)
Glucose-Capillary: 176 mg/dL — ABNORMAL HIGH (ref 70–99)
Glucose-Capillary: 185 mg/dL — ABNORMAL HIGH (ref 70–99)

## 2021-09-24 NOTE — Discharge Summary (Signed)
[name] JAS:[id number] DOB: January 02, 1929 DOA: 09/21/2021  PCP: [name], MD  Admit date: 09/21/2021 Discharge date: 09/24/2021  Admitted From: home Disposition:  home  Recommendations for Outpatient Follow-up:  Follow up with PCP in 1 week Please obtain BMP/CBC in one week Please follow up with cardiology in 1 week     Discharge Condition:Stable CODE STATUS: DNR Diet recommendation: Heart Healthy Brief/Interim Summary: Per ALP:FXTK [name] is a 87 y.o. female with medical history significant for HTN, DM, CVA stroke, gastrostomy secondary to esophageal stricture, combined CHF, CKD 3a and aortic stenosis who presents with shortness of breath that started on the [name] of arrival.  History is provided by daughter at the bedside due to patient's clinical condition.  It was associated with anterior chest which resolved by arrival.  Patient has not otherwise been ill and daughter denies any nausea, vomiting, fever or chills, abdominal pain or diarrhea.  Patient did have an episode of shortness of breath a few days prior and family gave patient an extra dose of Lasix and she was doing well until today.  On arrival of EMS O2 sat was 86% on room air improving to 100% on 8 L NRB.ED course: On arrival, BP 180/96 with pulse 103, respirations 30.  O2 sat 100% on NRB, transitioned to BiPAP  Blood work: Troponin 67, BNP 1594 Potassium 6.1, creatinine 1.19, glucose 253 CBC WNLChest x-ray with stable cardiomegaly.  Moderate progressive cardiogenic failure  Patient was transitioned to BiPAP due to continued increased work of breathing.  She was treated with Lasix and given a dose of calcium gluconate for her hyperkalemia.  EKG was sinus tachycardia.  Hospitalist consulted for admission.    Acute on chronic combined systolic and diastolic congestive heart failure: Acute hypoxemic respiratory failure: Recent echocardiogram with known ejection fraction 40 to 45% and grade 2 diastolic  dysfunction.  Probably aggravated by recent use of contrast for lower extremity angiogram. BNP was elevated Was given IV Lasix for aggressive diuresis Cardiology was consulted She was going to be discharged yesterday but required 4 L on ambulation, kept overnight for more diuresing This a.m. I personally followed her while she was being checked on room air with ambulation and she was satting 100% on room air not requiring home O2 Spoke to cardiology they recommended sending her home on her regular p.o. dose of Lasix Follow-up CHF clinic within 1 week    Essential hypertension:  Continue home dose       Type 2 diabetes mellitus with hyperlipidemia:  Continue home meds, statins   Elevated troponin Likely due to demand ischemia   Esophageal stricture, dysphagia and gastrostomy: Resume tube feeding.      CKD stage IIIa:  Overall close to baseline         Discharge Diagnoses:  Principal Problem:   Acute on chronic combined systolic and diastolic CHF (congestive heart failure) (HCC) Active Problems:   Esophageal stenosis   Type 2 diabetes mellitus without complication, without long-term current use of insulin (HCC)   CHF exacerbation (HCC)   Gastrostomy tube in place [facility])   Acute respiratory failure with hypoxia (HCC)   Essential hypertension   AKI (acute kidney injury) ([place])   Elevated troponin   Stage 3a chronic kidney disease [facility])    Discharge Instructions  Discharge Instructions     AMB referral to CHF clinic   Complete by: As directed    AMB referral to CHF clinic   Complete by: As directed  Call MD for:  difficulty breathing, headache or visual disturbances   Complete by: As directed    Call MD for:  difficulty breathing, headache or visual disturbances   Complete by: As directed    Diet - low sodium heart healthy   Complete by: As directed    Diet - low sodium heart healthy   Complete by: As directed    Discharge instructions   Complete by: As  directed    Follow up with cardiology next week Monitor sodium intake   Discharge instructions   Complete by: As directed    Start your lasix tomorrow 12/26   Increase activity slowly   Complete by: As directed    Increase activity slowly   Complete by: As directed       Allergies as of 09/24/2021       Reactions   Elemental Sulfur Diarrhea, Nausea And Vomiting   Gabapentin Swelling   Lipitor [atorvastatin] Other (See Comments)   Muscle aches   Mevacor [lovastatin] Other (See Comments)   Muscle aches   Milk-related Compounds Other (See Comments)   Large quantities cause headaches    Septra [sulfamethoxazole-trimethoprim] Other (See Comments)   Unknown   Statins Other (See Comments)   Muscle pain   Zocor [simvastatin] Other (See Comments)   Muscle aches        Medication List     STOP taking these medications    colchicine 0.6 MG tablet       TAKE these medications    acetaminophen 160 MG/5ML solution Commonly known as: TYLENOL Place 20.3 mLs (650 mg total) into feeding tube every 6 (six) hours as needed for mild pain.   allopurinol 100 MG tablet Commonly known as: ZYLOPRIM Take by mouth.   ascorbic acid 250 MG tablet Commonly known as: VITAMIN C Take 1 tablet by G tube 2 (two) times daily   aspirin 81 MG chewable tablet by Nasogastric route.   Bromfenac Sodium 0.09 % Soln Place 1 drop into the left eye 2 (two) times daily.   carvedilol 3.125 MG tablet Commonly known as: COREG Place 1 tablet (3.125 mg total) into feeding tube 2 (two) times daily with a meal.   clopidogrel 75 MG tablet Commonly known as: PLAVIX GIVE 1 TABLET VIA FEEDING TUBE EVERY [name] AS DIRECTED   feeding supplement ([employer] STANDARD 1.4) Liqd liquid Place 325 mLs into feeding tube 4 (four) times daily. What changed: when to take this   free water Soln Place 120 mLs into feeding tube 4 (four) times daily. What changed:  how much to take when to take this    furosemide 20 MG tablet Commonly known as: LASIX Place 1 tablet (20 mg total) into feeding tube 2 (two) times daily.   glipiZIDE 5 MG tablet Commonly known as: GLUCOTROL Take 5 mg by mouth every morning.   hydrocerin Crea Apply 1 application topically 2 (two) times daily. To bilateral feet   Hypromellose 0.2 % Soln Place 1 drop into both eyes 3 (three) times daily as needed (dry eyes).   lisinopril 2.5 MG tablet Commonly known as: ZESTRIL Take 1 tablet by mouth daily.   loperamide 1 MG/5ML solution Commonly known as: IMODIUM Take 1 mg by mouth as needed for diarrhea or loose stools.   metFORMIN 500 MG tablet Commonly known as: GLUCOPHAGE Place 1 tablet (500 mg total) into feeding tube 2 (two) times daily with a meal.   multivitamin Liqd Place 15 mLs into feeding tube daily.  pantoprazole sodium 40 mg Commonly known as: PROTONIX Place 20 mLs (40 mg total) into feeding tube daily.   polyethylene glycol 17 g packet Commonly known as: MIRALAX / GLYCOLAX Place 17 g into feeding tube daily as needed.   pregabalin 25 MG capsule Commonly known as: LYRICA Take 1 capsule (25 mg total) by mouth 2 (two) times daily. What changed: when to take this   rOPINIRole 2 MG tablet Commonly known as: REQUIP Place 1 tablet (2 mg total) into feeding tube 2 (two) times daily.               Durable Medical Equipment  (From admission, onward)           Start     Ordered   09/23/21 1233  For home use only DME oxygen  Once       Question Answer Comment  Length of Need 6 Months   Mode or (Route) Nasal cannula   Liters per Minute 4   Frequency Continuous (stationary and portable oxygen unit needed)   Oxygen conserving device Yes   Oxygen delivery system Gas      09/23/21 1232            Follow-up Information     [facility] Follow up in 1 week(s).   Specialty: Cardiology Contact information: [address] [place]        Lujean Amel D, MD Follow up in 1 week(s).   Specialties: Cardiology, Internal Medicine Contact information: [place] [address] [phone]         [name], MD Follow up in 1 week(s).   Specialty: Internal Medicine Contact information: [place] [address] [phone]                Allergies  Allergen Reactions   Elemental Sulfur Diarrhea and Nausea And Vomiting   Gabapentin Swelling   Lipitor [Atorvastatin] Other (See Comments)    Muscle aches   Mevacor [Lovastatin] Other (See Comments)    Muscle aches   Milk-Related Compounds Other (See Comments)    Large quantities cause headaches    Septra [Sulfamethoxazole-Trimethoprim] Other (See Comments)    Unknown   Statins Other (See Comments)    Muscle pain   Zocor [Simvastatin] Other (See Comments)    Muscle aches    Consultations: Cardiology   Procedures/Studies: PERIPHERAL VASCULAR CATHETERIZATION  Result Date: 09/18/2021 See surgical note for result.  DG Chest Portable 1 View  Result Date: 09/21/2021 CLINICAL DATA:  Dyspnea EXAM: PORTABLE CHEST 1 VIEW COMPARISON:  08/07/2021 FINDINGS: The lungs are symmetrically expanded. Moderate interstitial pulmonary infiltrate and small bilateral pleural effusions persist in keeping with moderate cardiogenic failure. This appears progressive since prior examination. Cardiomegaly is again noted, unchanged. No pneumothorax. No acute bone abnormality. IMPRESSION: Stable cardiomegaly.  Moderate, progressive cardiogenic failure. Electronically Signed   By: Fidela Salisbury M.D.   On: 09/21/2021 22:13   VAS [place] ABI WITH/WO TBI  Result Date: 09/18/2021  LOWER EXTREMITY DOPPLER STUDY Patient Name:  JANNIFER FISCHLER [name]  Date of Exam:   09/12/2021 Medical Rec #: [id number]         Accession #:    [id number] Date of Birth: 05/18/1929         Patient Gender: [name] Patient Age:   63 years Exam Location:  [facility] Procedure:  VAS [place] ABI WITH/WO TBI Referring Phys: --------------------------------------------------------------------------------  Indications: Peripheral artery disease.  Comparison Study: 06/2021 Performing Technologist: Concha Norway RVT  Examination Guidelines: A complete evaluation includes at minimum, Doppler waveform signals and systolic blood pressure reading at the level of bilateral brachial, anterior tibial, and posterior tibial arteries, when vessel segments are accessible. Bilateral testing is considered an integral part of a complete examination. Photoelectric Plethysmograph (PPG) waveforms and toe systolic pressure readings are included as required and additional duplex testing as needed. Limited examinations for reoccurring indications may be performed as noted.  ABI Findings: +---------+------------------+-----+--------+--------+  Right     Rt Pressure (mmHg) Index Waveform Comment   +---------+------------------+-----+--------+--------+  Brachial  129                                         +---------+------------------+-----+--------+--------+  ATA                                biphasic NonComp   +---------+------------------+-----+--------+--------+  PTA                                biphasic NonComp   +---------+------------------+-----+--------+--------+  Great Toe 41                 0.32  Normal             +---------+------------------+-----+--------+--------+ +---------+------------------+-----+----------+-------+  Left      Lt Pressure (mmHg) Index Waveform   Comment  +---------+------------------+-----+----------+-------+  Brachial  129                                          +---------+------------------+-----+----------+-------+  ATA       129                      monophasic          +---------+------------------+-----+----------+-------+  PTA       137                1.06  biphasic             +---------+------------------+-----+----------+-------+  Great Toe 17                 0.13  Abnormal            +---------+------------------+-----+----------+-------+ +-------+-----------+-----------+------------+------------+  ABI/TBI Today's ABI Today's TBI Previous ABI Previous TBI  +-------+-----------+-----------+------------+------------+  Right   NonComp     .32         NonComp      .52           +-------+-----------+-----------+------------+------------+  Left    1.06        .13         .77          .40           +-------+-----------+-----------+------------+------------+ TOES Findings: +----------+---------------+--------+-------+  Right Toes Pressure (mmHg) Waveform Comment  +----------+---------------+--------+-------+  1st Digit                  Normal            +----------+---------------+--------+-------+  2nd Digit  Abnormal          +----------+---------------+--------+-------+  3rd Digit                  Abnormal          +----------+---------------+--------+-------+  4th Digit                  Abnormal          +----------+---------------+--------+-------+  5th Digit                  Abnormal          +----------+---------------+--------+-------+  +---------+---------------+--------+-------+  Left Toes Pressure (mmHg) Waveform Comment  +---------+---------------+--------+-------+  1st Digit                 Abnormal          +---------+---------------+--------+-------+  2nd Digit                 Abnormal          +---------+---------------+--------+-------+  3rd Digit                 Abnormal          +---------+---------------+--------+-------+  4th Digit                 Abnormal          +---------+---------------+--------+-------+  5th Digit                 Abnormal          +---------+---------------+--------+-------+   Left ABIs appear increased compared to prior study on 06/2021. Bilateral TBIs appear decreased.  Summary: Right: Resting right ankle-brachial index indicates  noncompressible right lower extremity arteries. The right toe-brachial index is abnormal. Abnormal waveforms in 2nd-fifth toes. Left: Resting left ankle-brachial index is within normal range. No evidence of significant left lower extremity arterial disease. The left toe-brachial index is abnormal. Abnormal waveforms in all toes.  *See table(s) above for measurements and observations.  Electronically signed by [name] MD on 09/18/2021 at 12:42:19 PM.    Final       Subjective: Does better, no shortness of breath.  On room air now even with ambulation satting more than 95%.  On ambulation she was satting 100%.  Discharge Exam: Vitals:   09/24/21 0410 09/24/21 0802  BP: 132/67 136/62  Pulse: 76 70  Resp: 16 18  Temp: 97.8 [name] (36.6 C) 98.1 [name] (36.7 C)  SpO2: 99% 100%   Vitals:   09/24/21 0003 09/24/21 0410 09/24/21 0417 09/24/21 0802  BP: (!) 127/56 132/67  136/62  Pulse: 75 76  70  Resp: 18 16  18   Temp: 97.9 [name] (36.6 C) 97.8 [name] (36.6 C)  98.1 [name] (36.7 C)  TempSrc: Axillary Oral  Oral  SpO2: 100% 99%  100%  Weight:   50.8 kg   Height:        General: Pt is alert, awake, not in acute distress Cardiovascular: RRR, S1/S2 +, no rubs, no gallops Respiratory: CTA bilaterally, no wheezing, no rhonchi Abdominal: Soft, NT, ND, bowel sounds + Extremities: no edema, no cyanosis    The results of significant diagnostics from this hospitalization (including imaging, microbiology, ancillary and laboratory) are listed below for reference.     Microbiology: Recent Results (from the past 240 hour(s))  Resp Panel by RT-PCR (Flu A&B, Covid) Nasopharyngeal Swab     Status: None   Collection Time: 09/21/21  9:55 PM   Specimen: Nasopharyngeal  Swab; Nasopharyngeal(NP) swabs in vial transport medium  Result Value Ref Range Status   SARS Coronavirus 2 by RT PCR NEGATIVE NEGATIVE Final    Comment: (NOTE) SARS-CoV-2 target nucleic acids are NOT DETECTED.  The SARS-CoV-2 RNA is generally  detectable in upper respiratory specimens during the acute phase of infection. The lowest concentration of SARS-CoV-2 viral copies this assay can detect is 138 copies/mL. A negative result does not preclude SARS-Cov-2 infection and should not be used as the sole basis for treatment or other patient management decisions. A negative result may occur with  improper specimen collection/handling, submission of specimen other than nasopharyngeal swab, presence of viral mutation(s) within the areas targeted by this assay, and inadequate number of viral copies(<138 copies/mL). A negative result must be combined with clinical observations, patient history, and epidemiological information. The expected result is Negative.  Fact Sheet for Patients:  EntrepreneurPulse.com.au  Fact Sheet for Healthcare Providers:  IncredibleEmployment.be  This test is no t yet approved or cleared by the [place] FDA and  has been authorized for detection and/or diagnosis of SARS-CoV-2 by FDA under an Emergency Use Authorization (EUA). This EUA will remain  in effect (meaning this test can be used) for the duration of the COVID-19 declaration under Section 564(b)(1) of the Act, 21 U.S.C.section 360bbb-3(b)(1), unless the authorization is terminated  or revoked sooner.       Influenza A by PCR NEGATIVE NEGATIVE Final   Influenza B by PCR NEGATIVE NEGATIVE Final    Comment: (NOTE) The Xpert Xpress SARS-CoV-2/FLU/RSV plus assay is intended as an aid in the diagnosis of influenza from Nasopharyngeal swab specimens and should not be used as a sole basis for treatment. Nasal washings and aspirates are unacceptable for Xpert Xpress SARS-CoV-2/FLU/RSV testing.  Fact Sheet for Patients: EntrepreneurPulse.com.au  Fact Sheet for Healthcare Providers: IncredibleEmployment.be  This test is not yet approved or cleared by the [place] FDA  and has been authorized for detection and/or diagnosis of SARS-CoV-2 by FDA under an Emergency Use Authorization (EUA). This EUA will remain in effect (meaning this test can be used) for the duration of the COVID-19 declaration under Section 564(b)(1) of the Act, 21 U.S.C. section 360bbb-3(b)(1), unless the authorization is terminated or revoked.  Performed at [facility], [address] [place], [place] [address]      Labs: BNP (last 3 results) Recent Labs    07/27/21 1441 08/07/21 1645 09/21/21 2155  BNP 1,721.6* 1,311.1* 7,412.8*   Basic Metabolic Panel: Recent Labs  Lab 09/18/21 1011 09/21/21 2155 09/22/21 0646 09/23/21 0736 09/24/21 0531  NA  --  138 139 143 142  K  --  6.1* 4.6 3.9 3.9  CL  --  105 103 104 102  CO2  --  25 30 31 30   GLUCOSE  --  253* 100* 127* 173*  BUN 64* 70* 77* 65* 62*  CREATININE 1.12* 1.19* 1.21* 1.06* 1.28*  CALCIUM  --  7.8* 8.6* 9.2 9.1  MG  --   --   --  2.8*  --   PHOS  --   --   --  4.0  --    Liver Function Tests: No results for input(s): AST, ALT, ALKPHOS, BILITOT, PROT, ALBUMIN in the last 168 hours. No results for input(s): LIPASE, AMYLASE in the last 168 hours. No results for input(s): AMMONIA in the last 168 hours. CBC: Recent Labs  Lab 09/21/21 2155  WBC 7.3  NEUTROABS 4.9  HGB 12.2  HCT 39.6  MCV 94.1  PLT 269   Cardiac Enzymes: No results for input(s): CKTOTAL, CKMB, CKMBINDEX, TROPONINI in the last 168 hours. BNP: Invalid input(s): POCBNP CBG: Recent Labs  Lab 09/23/21 1739 09/23/21 1947 09/24/21 0003 09/24/21 0410 09/24/21 0927  GLUCAP 260* 241* 105* 176* 185*   D-Dimer No results for input(s): DDIMER in the last 72 hours. Hgb A1c No results for input(s): HGBA1C in the last 72 hours. Lipid Profile No results for input(s): CHOL, HDL, LDLCALC, TRIG, CHOLHDL, LDLDIRECT in the last 72 hours. Thyroid function studies No results for input(s): TSH, T4TOTAL, T3FREE, THYROIDAB in the last 72  hours.  Invalid input(s): FREET3 Anemia work up No results for input(s): VITAMINB12, FOLATE, FERRITIN, TIBC, IRON, RETICCTPCT in the last 72 hours. Urinalysis    Component Value Date/Time   COLORURINE YELLOW (A) 08/08/2021 0314   APPEARANCEUR HAZY (A) 08/08/2021 0314   LABSPEC 1.005 08/08/2021 0314   PHURINE 8.0 08/08/2021 0314   GLUCOSEU NEGATIVE 08/08/2021 0314   HGBUR NEGATIVE 08/08/2021 0314   BILIRUBINUR NEGATIVE 08/08/2021 0314   KETONESUR NEGATIVE 08/08/2021 0314   PROTEINUR NEGATIVE 08/08/2021 0314   NITRITE NEGATIVE 08/08/2021 0314   LEUKOCYTESUR LARGE (A) 08/08/2021 0314   Sepsis Labs Invalid input(s): PROCALCITONIN,  WBC,  LACTICIDVEN Microbiology Recent Results (from the past 240 hour(s))  Resp Panel by RT-PCR (Flu A&B, Covid) Nasopharyngeal Swab     Status: None   Collection Time: 09/21/21  9:55 PM   Specimen: Nasopharyngeal Swab; Nasopharyngeal(NP) swabs in vial transport medium  Result Value Ref Range Status   SARS Coronavirus 2 by RT PCR NEGATIVE NEGATIVE Final    Comment: (NOTE) SARS-CoV-2 target nucleic acids are NOT DETECTED.  The SARS-CoV-2 RNA is generally detectable in upper respiratory specimens during the acute phase of infection. The lowest concentration of SARS-CoV-2 viral copies this assay can detect is 138 copies/mL. A negative result does not preclude SARS-Cov-2 infection and should not be used as the sole basis for treatment or other patient management decisions. A negative result may occur with  improper specimen collection/handling, submission of specimen other than nasopharyngeal swab, presence of viral mutation(s) within the areas targeted by this assay, and inadequate number of viral copies(<138 copies/mL). A negative result must be combined with clinical observations, patient history, and epidemiological information. The expected result is Negative.  Fact Sheet for Patients:  EntrepreneurPulse.com.au  Fact Sheet for  Healthcare Providers:  IncredibleEmployment.be  This test is no t yet approved or cleared by the [place] FDA and  has been authorized for detection and/or diagnosis of SARS-CoV-2 by FDA under an Emergency Use Authorization (EUA). This EUA will remain  in effect (meaning this test can be used) for the duration of the COVID-19 declaration under Section 564(b)(1) of the Act, 21 U.S.C.section 360bbb-3(b)(1), unless the authorization is terminated  or revoked sooner.       Influenza A by PCR NEGATIVE NEGATIVE Final   Influenza B by PCR NEGATIVE NEGATIVE Final    Comment: (NOTE) The Xpert Xpress SARS-CoV-2/FLU/RSV plus assay is intended as an aid in the diagnosis of influenza from Nasopharyngeal swab specimens and should not be used as a sole basis for treatment. Nasal washings and aspirates are unacceptable for Xpert Xpress SARS-CoV-2/FLU/RSV testing.  Fact Sheet for Patients: EntrepreneurPulse.com.au  Fact Sheet for Healthcare Providers: IncredibleEmployment.be  This test is not yet approved or cleared by the [place] FDA and has been authorized for detection and/or diagnosis of SARS-CoV-2 by FDA under an Emergency Use Authorization (EUA). This  EUA will remain in effect (meaning this test can be used) for the duration of the COVID-19 declaration under Section 564(b)(1) of the Act, 21 U.S.C. section 360bbb-3(b)(1), unless the authorization is terminated or revoked.  Performed at [facility], [address] [place], [place] [address]      Time coordinating discharge: Over 30 minutes  SIGNED:   Nolberto Hanlon, MD  [facility] 09/24/2021, 11:40 AM Pager   If 7PM-7AM, please contact night-coverage [email or web address] Password [email or web address]

## 2021-10-01 DIAGNOSIS — I739 Peripheral vascular disease, unspecified: Secondary | ICD-10-CM

## 2021-10-01 HISTORY — DX: Peripheral vascular disease, unspecified: I73.9

## 2021-10-09 NOTE — Progress Notes (Signed)
Patient ID: [name], female    DOB: [birth year], 88 y.o.   MRN: [id number]  HPI  [name] is a 88 y/o female with a history of DM, hyperlipidemia, HTN, CKD, brain tumor, GERD, pulmonary HTN, anemia, G-tube and chronic heart failure.   Echo report from 07/26/21 reviewed and showed an EF of 40-45% along with mild LVH, severe LAE and moderate/ severe MR. Echo report from 12/18/20 reviewed and showed an EF of 35-40% along with mild LVH, moderate LAE and moderate/severe MR. Echo report from 11/24/19 reviewed and showed an EF of 20-25% along with severe MR, mod/severe TR, mild AR, moderately elevated PA pressure and large pleural effusion on the left.   Admitted 09/21/21 due to shortness of breath and chest pain. Placed on 8L oxygen due to hypoxia but then needed bipap. Cardiology consult obtained. Initially given IV lasix with transition to oral diuretics. Weaned off of oxygen. Discharged after 3 days. Admitted 08/07/21 due to acute on chronic HF. Initially given IV lasix with transition to oral diuretics. IV iron given due to anemia. Discharged after 2 days. Was in the ED 07/25/21 due to shortness of breath and chest pain. Given IV lasix and she was released.   She presents today for a follow-up visit with a chief complaint of minimal shortness of breath upon moderate exertion. She describes this as chronic in nature having been present for several years. She has associated palpitations, chronic diarrhea, left foot pain, weakness and light-headedness along with this. She denies any weight gain, difficulty sleeping, abdominal distention, pedal edema, chest pain, cough or fatigue.   She says that she can only tolerate 3 tube feedings/ [name] as if the does 4, she reports feeling bloated and uncomfortable in her abdomen. Takes very little liquids by mouth mostly just enough to wet her dry mouth.    Past Medical History:  Diagnosis Date   Anemia    IRON INFUSIONS   Aortic valve sclerosis     Arrhythmia    Arthritis    osteoarthritis   Basal cell carcinoma of skin    Brain tumor (HCC)    Brain tumor ([place])    Cervical spine disease    CHF (congestive heart failure) (HCC)    Diabetes mellitus without complication (HCC)    Dysrhythmia    sinus arrhythmia   Esophageal stricture    severe, led to feeding tube placement in Sept 2019   Esophageal ulcer without bleeding    Gastrostomy tube dependent ([place])    DOES NOT EAT OR DRINK    GERD (gastroesophageal reflux disease)    Hearing loss    pt had hearing test  last month, left ear30% hearing due to stroke, 78% in right-normal for her age   History of kidney stones    Hyperlipemia    Hypertension    Kidney stones    Leaky heart valve    Meningioma (HCC)    Occlusive mesenteric ischemia (HCC)    Pulmonary hypertension (HCC)    RLS (restless legs syndrome)    Stroke ([place])    Vertigo    Past Surgical History:  Procedure Laterality Date   ABDOMINAL HYSTERECTOMY     APPENDECTOMY     ARTHROGRAM KNEE Left    BACK SURGERY     CERVIVAL NECK FUSION   CATARACT EXTRACTION W/PHACO Left 11/11/2018   Procedure: CATARACT EXTRACTION PHACO AND INTRAOCULAR LENS PLACEMENT ([place]) LEFT, DIABETIC;  Surgeon: Birder Robson, MD;  Location: [facility] ORS;  Service: Ophthalmology;  Laterality: Left;  [place]  00:41 CDE 6.41 Fluid pack lot # [id number]   COLONOSCOPY WITH PROPOFOL N/A 06/20/2017   Procedure: COLONOSCOPY WITH PROPOFOL;  Surgeon: Lollie Sails, MD;  Location: [facility] ENDOSCOPY;  Service: Endoscopy;  Laterality: N/A;   ESOPHAGOGASTRODUODENOSCOPY (EGD) WITH PROPOFOL N/A 03/14/2015   Procedure: ESOPHAGOGASTRODUODENOSCOPY (EGD) WITH PROPOFOL;  Surgeon: Josefine Class, MD;  Location: [facility] ENDOSCOPY;  Service: Endoscopy;  Laterality: N/A;   ESOPHAGOGASTRODUODENOSCOPY (EGD) WITH PROPOFOL N/A 03/28/2017   Procedure: ESOPHAGOGASTRODUODENOSCOPY (EGD) WITH PROPOFOL;  Surgeon: Lollie Sails, MD;  Location: [facility] ENDOSCOPY;  Service: Endoscopy;   Laterality: N/A;   ESOPHAGOGASTRODUODENOSCOPY (EGD) WITH PROPOFOL N/A 06/20/2017   Procedure: ESOPHAGOGASTRODUODENOSCOPY (EGD) WITH PROPOFOL;  Surgeon: Lollie Sails, MD;  Location: [facility] ENDOSCOPY;  Service: Endoscopy;  Laterality: N/A;   ESOPHAGOGASTRODUODENOSCOPY (EGD) WITH PROPOFOL N/A 10/21/2017   Procedure: ESOPHAGOGASTRODUODENOSCOPY (EGD) WITH PROPOFOL;  Surgeon: Lollie Sails, MD;  Location: [facility] ENDOSCOPY;  Service: Endoscopy;  Laterality: N/A;   ESOPHAGOGASTRODUODENOSCOPY (EGD) WITH PROPOFOL N/A 04/15/2018   Procedure: ESOPHAGOGASTRODUODENOSCOPY (EGD) WITH PROPOFOL;  Surgeon: Lollie Sails, MD;  Location: [facility] ENDOSCOPY;  Service: Endoscopy;  Laterality: N/A;   ESOPHAGUS SURGERY     CLOSURE   EYE SURGERY     HYSTERECTOMY ABDOMINAL WITH SALPINGECTOMY     IR GASTROSTOMY TUBE MOD SED  06/11/2018   IR GASTROSTOMY TUBE REMOVAL  03/25/2019   IR REPLACE G-TUBE SIMPLE WO FLUORO  10/19/2020   LOWER EXTREMITY ANGIOGRAPHY Left 09/18/2021   Procedure: LOWER EXTREMITY ANGIOGRAPHY;  Surgeon: Algernon Huxley, MD;  Location: [place] CV LAB;  Service: Cardiovascular;  Laterality: Left;   PERIPHERAL VASCULAR CATHETERIZATION N/A 01/23/2016   Procedure: Visceral Venography;  Surgeon: Algernon Huxley, MD;  Location: [place] CV LAB;  Service: Cardiovascular;  Laterality: N/A;   PERIPHERAL VASCULAR CATHETERIZATION  01/23/2016   Procedure: Peripheral Vascular Intervention;  Surgeon: Algernon Huxley, MD;  Location: [place] CV LAB;  Service: Cardiovascular;;   UPPER ESOPHAGEAL ENDOSCOPIC ULTRASOUND (EUS) N/A 12/05/2017   Procedure: UPPER ESOPHAGEAL ENDOSCOPIC ULTRASOUND (EUS);  Surgeon: Jola Schmidt, MD;  Location: [facility] ENDOSCOPY;  Service: Endoscopy;  Laterality: N/A;   VISCERAL ANGIOGRAPHY N/A 03/18/2017   Procedure: Visceral Angiography;  Surgeon: Algernon Huxley, MD;  Location: [place] CV LAB;  Service: Cardiovascular;  Laterality: N/A;   VISCERAL ARTERY INTERVENTION N/A 03/18/2017    Procedure: Visceral Artery Intervention;  Surgeon: Algernon Huxley, MD;  Location: [place] CV LAB;  Service: Cardiovascular;  Laterality: N/A;   Family History  Problem Relation Age of Onset   Stroke Mother    Hypertension Mother    Heart disease Mother    Cancer Father    Heart disease Father    Heart attack Sister    Diabetes Sister    Heart attack Brother    Thyroid cancer Daughter    Cancer - Colon Daughter    Social History   Tobacco Use   Smoking status: Never   Smokeless tobacco: Never  Substance Use Topics   Alcohol use: Never   Allergies  Allergen Reactions   Elemental Sulfur Diarrhea and Nausea And Vomiting   Gabapentin Swelling   Lipitor [Atorvastatin] Other (See Comments)    Muscle aches   Mevacor [Lovastatin] Other (See Comments)    Muscle aches   Milk-Related Compounds Other (See Comments)    Large quantities cause headaches    Septra [Sulfamethoxazole-Trimethoprim] Other (See Comments)    Unknown   Statins Other (  See Comments)    Muscle pain   Zocor [Simvastatin] Other (See Comments)    Muscle aches   Prior to Admission medications   Medication Sig Start Date End Date Taking? Authorizing Provider  acetaminophen (TYLENOL) 160 MG/5ML solution Place 20.3 mLs (650 mg total) into feeding tube every 6 (six) hours as needed for mild pain. 06/13/18  Yes Demetrios Loll, MD  allopurinol (ZYLOPRIM) 100 MG tablet Take by mouth. 05/26/21 11/22/21 Yes [provider]  aspirin 81 MG chewable tablet by Nasogastric route. 12/22/20  Yes [provider]  carvedilol (COREG) 3.125 MG tablet Place 1 tablet (3.125 mg total) into feeding tube 2 (two) times daily with a meal. 12/22/20  Yes Regalado, Belkys A, MD  clopidogrel (PLAVIX) 75 MG tablet GIVE 1 TABLET VIA FEEDING TUBE EVERY [name] AS DIRECTED 12/30/20  Yes Love, Ivan Anchors, PA-C  furosemide (LASIX) 20 MG tablet Place 1 tablet (20 mg total) into feeding tube 2 (two) times daily. 08/09/21  Yes Wieting, Richard, MD   glipiZIDE (GLUCOTROL) 5 MG tablet Take 5 mg by mouth every morning. 08/30/21  Yes [provider]  lisinopril (ZESTRIL) 2.5 MG tablet Take 1 tablet by mouth daily. 07/21/21 01/17/22 Yes [provider]  metFORMIN (GLUCOPHAGE) 500 MG tablet Place 1 tablet (500 mg total) into feeding tube 2 (two) times daily with a meal. 02/17/21  Yes Fritzi Mandes, MD  Multiple Vitamin (MULTIVITAMIN) LIQD Place 15 mLs into feeding tube daily. 06/13/18  Yes Demetrios Loll, MD  Nutritional Supplements (FEEDING SUPPLEMENT, KATE FARMS STANDARD 1.4,) LIQD liquid Place 325 mLs into feeding tube 4 (four) times daily. Patient taking differently: Place 325 mLs into feeding tube 3 (three) times daily between meals. 12/30/20  Yes Love, Ivan Anchors, PA-C  pantoprazole sodium (PROTONIX) 40 mg/20 mL PACK Place 20 mLs (40 mg total) into feeding tube daily. 12/30/20  Yes Love, Ivan Anchors, PA-C  pregabalin (LYRICA) 25 MG capsule Take 1 capsule (25 mg total) by mouth 2 (two) times daily. Patient taking differently: Take 25 mg by mouth daily. 08/09/21  Yes Wieting, Richard, MD  rOPINIRole (REQUIP) 2 MG tablet Place 1 tablet (2 mg total) into feeding tube 2 (two) times daily. 12/30/20  Yes Love, Ivan Anchors, PA-C  Water For Irrigation, Sterile (FREE WATER) SOLN Place 120 mLs into feeding tube 4 (four) times daily. Patient taking differently: Place 160 mLs into feeding tube 3 (three) times daily. 08/09/21  Yes Wieting, Richard, MD  ascorbic acid (VITAMIN C) 250 MG tablet Take 1 tablet by G tube 2 (two) times daily Patient not taking: Reported on 09/04/2021 06/13/18   [provider]  Bromfenac Sodium 0.09 % SOLN Place 1 drop into the left eye 2 (two) times daily. Patient not taking: Reported on 08/10/2021 06/15/21   [provider]  hydrocerin (EUCERIN) CREA Apply 1 application topically 2 (two) times daily. To bilateral feet Patient not taking: Reported on 10/10/2021 12/27/20   Love, Ivan Anchors, PA-C  Hypromellose 0.2 % SOLN  Place 1 drop into both eyes 3 (three) times daily as needed (dry eyes). Patient not taking: Reported on 09/12/2021    [provider]  loperamide (IMODIUM) 1 MG/5ML solution Take 1 mg by mouth as needed for diarrhea or loose stools. Patient not taking: Reported on 09/18/2021    [provider]  polyethylene glycol (MIRALAX / GLYCOLAX) 17 g packet Place 17 g into feeding tube daily as needed. Patient not taking: Reported on 09/04/2021 12/30/20   Bary Leriche,  PA-C   Review of Systems  Constitutional:  Negative for appetite change and fatigue.  HENT:  Positive for congestion. Negative for postnasal drip and sore throat.   Eyes: Negative.   Respiratory:  Positive for shortness of breath. Negative for cough and chest tightness.   Cardiovascular:  Positive for palpitations. Negative for chest pain and leg swelling.  Gastrointestinal:  Positive for diarrhea (after tube feeds). Negative for abdominal distention.  Endocrine: Negative.   Genitourinary: Negative.   Musculoskeletal:  Positive for arthralgias (toe on left foot hurts sometimes). Negative for back pain and neck pain.  Skin: Negative.   Allergic/Immunologic: Negative.   Neurological:  Positive for weakness (right side), light-headedness and numbness (in feet/toes). Negative for dizziness.  Hematological:  Negative for adenopathy. Bruises/bleeds easily (left arm).  Psychiatric/Behavioral:  Negative for dysphoric mood and sleep disturbance (sleeping on 3 pillows). The patient is not nervous/anxious.    Vitals:   10/10/21 1442  BP: 139/65  Pulse: 66  Resp: 18  SpO2: 100%  Weight: 116 lb (52.6 kg)  Height: 5\' 4"  (1.626 m)   Wt Readings from Last 3 Encounters:  10/10/21 116 lb (52.6 kg)  09/24/21 112 lb (50.8 kg)  09/18/21 117 lb 12.8 oz (53.4 kg)   Lab Results  Component Value Date   CREATININE 1.28 (H) 09/24/2021   CREATININE 1.06 (H) 09/23/2021   CREATININE 1.21 (H) 09/22/2021   Physical Exam Vitals and  nursing note reviewed. Exam conducted with a chaperone present (daughter).  Constitutional:      Appearance: She is well-developed.  HENT:     Head: Normocephalic and atraumatic.  Neck:     Vascular: No JVD.  Cardiovascular:     Rate and Rhythm: Normal rate and regular rhythm.  Pulmonary:     Effort: Pulmonary effort is normal. No respiratory distress.     Breath sounds: No wheezing or rales.  Abdominal:     Palpations: Abdomen is soft.     Tenderness: There is no abdominal tenderness.  Musculoskeletal:        General: No tenderness.     Cervical back: Normal range of motion and neck supple.     Right lower leg: No tenderness. Edema (trace pitting) present.     Left lower leg: No tenderness. Edema (trace pitting) present.  Skin:    General: Skin is warm and dry.  Neurological:     General: No focal deficit present.     Mental Status: She is alert and oriented to person, place, and time.  Psychiatric:        Mood and Affect: Mood normal.        Behavior: Behavior normal.   Assessment & Plan:  1: Chronic heart failure with reduced ejection fraction- - NYHA class II - euvolemic today - weighing daily; reminded to call for an overnight weight gain of >2 pounds or a weekly weight gain of >5 pounds - weight down 2 pounds from last visit here 1 month ago - saw cardiology ([facility]) 08/16/21; will now be seeing [facility] with appointment tomorrow - on GDMT of carvedilol & lisinopril - spironolactone was stopped due to hyperkalemia - consider adding SGLT2 - participating in paramedicine program - discussed fluid intake through her feeding tube; advised to keep her total fluids to between 1500-2033ml / [name].  - encouraged to elevate legs some today due to slight edema in legs - BNP 09/21/21 was 1594.8 - reports receiving her flu vaccine for this season  2: HTN- -  BP mildly elevated (139/65) - saw PCP (Hande) 09/29/21 - BMP 09/24/21 reviewed and showed sodium 142, potassium 3.9,  creatinine 1.28 and GFR 40  3: DM- - A1c 08/03/21 was 5.4% - glucose at home today was 150 - saw endocrinology Pasty Arch) 06/09/20; returns 11/07/21  4: Anemia- - saw hematology Janese Banks) 09/04/21 - hemoglobin 09/21/21 was 12.2  5: Esophageal stenosis- - saw GI Jacqulyn Liner) 07/17/21 - gets all feeding through G-tube; can only tolerate 3 tube feedings/ [name] due to abdominal bloating/distention   Medication list reviewed.   Return in 3 months or sooner for any questions/problems before then.

## 2021-10-10 ENCOUNTER — Encounter: Payer: Self-pay | Admitting: Family

## 2021-10-10 ENCOUNTER — Other Ambulatory Visit: Payer: Self-pay

## 2021-10-10 ENCOUNTER — Ambulatory Visit: Payer: Medicare Other | Attending: Family | Admitting: Family

## 2021-10-10 VITALS — BP 139/65 | HR 66 | Resp 18 | Ht 64.0 in | Wt 116.0 lb

## 2021-10-10 DIAGNOSIS — I083 Combined rheumatic disorders of mitral, aortic and tricuspid valves: Secondary | ICD-10-CM | POA: Diagnosis not present

## 2021-10-10 DIAGNOSIS — Z79899 Other long term (current) drug therapy: Secondary | ICD-10-CM | POA: Insufficient documentation

## 2021-10-10 DIAGNOSIS — E785 Hyperlipidemia, unspecified: Secondary | ICD-10-CM | POA: Diagnosis not present

## 2021-10-10 DIAGNOSIS — K219 Gastro-esophageal reflux disease without esophagitis: Secondary | ICD-10-CM | POA: Insufficient documentation

## 2021-10-10 DIAGNOSIS — E875 Hyperkalemia: Secondary | ICD-10-CM | POA: Insufficient documentation

## 2021-10-10 DIAGNOSIS — I272 Pulmonary hypertension, unspecified: Secondary | ICD-10-CM | POA: Diagnosis not present

## 2021-10-10 DIAGNOSIS — I5022 Chronic systolic (congestive) heart failure: Secondary | ICD-10-CM | POA: Diagnosis not present

## 2021-10-10 DIAGNOSIS — I10 Essential (primary) hypertension: Secondary | ICD-10-CM | POA: Diagnosis not present

## 2021-10-10 DIAGNOSIS — I130 Hypertensive heart and chronic kidney disease with heart failure and stage 1 through stage 4 chronic kidney disease, or unspecified chronic kidney disease: Secondary | ICD-10-CM | POA: Diagnosis not present

## 2021-10-10 DIAGNOSIS — K222 Esophageal obstruction: Secondary | ICD-10-CM | POA: Diagnosis not present

## 2021-10-10 DIAGNOSIS — D509 Iron deficiency anemia, unspecified: Secondary | ICD-10-CM | POA: Insufficient documentation

## 2021-10-10 DIAGNOSIS — D496 Neoplasm of unspecified behavior of brain: Secondary | ICD-10-CM | POA: Insufficient documentation

## 2021-10-10 DIAGNOSIS — J90 Pleural effusion, not elsewhere classified: Secondary | ICD-10-CM | POA: Diagnosis not present

## 2021-10-10 DIAGNOSIS — N189 Chronic kidney disease, unspecified: Secondary | ICD-10-CM | POA: Diagnosis not present

## 2021-10-10 DIAGNOSIS — E1122 Type 2 diabetes mellitus with diabetic chronic kidney disease: Secondary | ICD-10-CM

## 2021-10-10 DIAGNOSIS — N1831 Chronic kidney disease, stage 3a: Secondary | ICD-10-CM

## 2021-10-10 DIAGNOSIS — Z931 Gastrostomy status: Secondary | ICD-10-CM | POA: Diagnosis not present

## 2021-10-10 NOTE — Patient Instructions (Signed)
Continue weighing daily and call for an overnight weight gain of 3 pounds or more or a weekly weight gain of more than 5 pounds.  °

## 2021-10-17 ENCOUNTER — Encounter: Payer: Self-pay | Admitting: Registered Nurse

## 2021-10-17 ENCOUNTER — Encounter: Payer: Medicare Other | Attending: Registered Nurse | Admitting: Registered Nurse

## 2021-10-17 ENCOUNTER — Other Ambulatory Visit: Payer: Self-pay

## 2021-10-17 VITALS — BP 121/66 | HR 85 | Temp 97.7°F | Ht 64.0 in | Wt 116.0 lb

## 2021-10-17 DIAGNOSIS — Z931 Gastrostomy status: Secondary | ICD-10-CM | POA: Diagnosis present

## 2021-10-17 DIAGNOSIS — R269 Unspecified abnormalities of gait and mobility: Secondary | ICD-10-CM

## 2021-10-17 DIAGNOSIS — I63512 Cerebral infarction due to unspecified occlusion or stenosis of left middle cerebral artery: Secondary | ICD-10-CM | POA: Diagnosis present

## 2021-10-17 DIAGNOSIS — K222 Esophageal obstruction: Secondary | ICD-10-CM | POA: Diagnosis present

## 2021-10-17 DIAGNOSIS — R531 Weakness: Secondary | ICD-10-CM

## 2021-10-17 NOTE — Progress Notes (Signed)
Subjective:    Patient ID: [name], female    DOB: Nov 06, 1928, 88 y.o.   MRN: [id number]  HPI: [name] is a 88 y.o. female who returns for follow up appointment of her CVA and Right Sided Weakness. She states her pain is located in her left lower extremity. ( Daughter states vascular following). She rates her pain 8. Her current exercise regime is walking with her walker at home.   Discussed with Dr Ranell Patrick regarding [name] returning to office on PRN basis, Neurology following up on her CVA.She agrees with the above.   [name] and daughter verbalizes understanding.    Pain Inventory Average Pain 5 Pain Right Now 8 My pain is intermittent and sharp  In the last 24 hours, has pain interfered with the following? General activity 6 Relation with others 0 Enjoyment of life 5 What TIME of [name] is your pain at its worst? varies Sleep (in general) NA  Pain is worse with: unsure Pain improves with:  n/a Relief from Meds: 0  Family History  Problem Relation Age of Onset   Stroke Mother    Hypertension Mother    Heart disease Mother    Cancer Father    Heart disease Father    Heart attack Sister    Diabetes Sister    Heart attack Brother    Thyroid cancer Daughter    Cancer - Colon Daughter    Social History   Socioeconomic History   Marital status: Widowed    Spouse name: Not on file   Number of children: Not on file   Years of education: Not on file   Highest education level: Not on file  Occupational History   Not on file  Tobacco Use   Smoking status: Never   Smokeless tobacco: Never  Vaping Use   Vaping Use: Never used  Substance and Sexual Activity   Alcohol use: Never   Drug use: Never   Sexual activity: Not Currently  Other Topics Concern   Not on file  Social History Narrative   Lives alone.    Daughter helps her.    Social Determinants of Health   Financial Resource Strain: Not on file  Food Insecurity: Not on file   Transportation Needs: Not on file  Physical Activity: Not on file  Stress: Not on file  Social Connections: Not on file   Past Surgical History:  Procedure Laterality Date   ABDOMINAL HYSTERECTOMY     APPENDECTOMY     ARTHROGRAM KNEE Left    BACK SURGERY     CERVIVAL NECK FUSION   CATARACT EXTRACTION W/PHACO Left 11/11/2018   Procedure: CATARACT EXTRACTION PHACO AND INTRAOCULAR LENS PLACEMENT ([place]) LEFT, DIABETIC;  Surgeon: Birder Robson, MD;  Location: [facility] ORS;  Service: Ophthalmology;  Laterality: Left;  [place]  00:41 CDE 6.41 Fluid pack lot # [id number]   COLONOSCOPY WITH PROPOFOL N/A 06/20/2017   Procedure: COLONOSCOPY WITH PROPOFOL;  Surgeon: Lollie Sails, MD;  Location: [facility] ENDOSCOPY;  Service: Endoscopy;  Laterality: N/A;   ESOPHAGOGASTRODUODENOSCOPY (EGD) WITH PROPOFOL N/A 03/14/2015   Procedure: ESOPHAGOGASTRODUODENOSCOPY (EGD) WITH PROPOFOL;  Surgeon: Josefine Class, MD;  Location: [facility] ENDOSCOPY;  Service: Endoscopy;  Laterality: N/A;   ESOPHAGOGASTRODUODENOSCOPY (EGD) WITH PROPOFOL N/A 03/28/2017   Procedure: ESOPHAGOGASTRODUODENOSCOPY (EGD) WITH PROPOFOL;  Surgeon: Lollie Sails, MD;  Location: [facility] ENDOSCOPY;  Service: Endoscopy;  Laterality: N/A;   ESOPHAGOGASTRODUODENOSCOPY (EGD) WITH PROPOFOL N/A 06/20/2017   Procedure: ESOPHAGOGASTRODUODENOSCOPY (EGD)  WITH PROPOFOL;  Surgeon: Lollie Sails, MD;  Location: [facility] ENDOSCOPY;  Service: Endoscopy;  Laterality: N/A;   ESOPHAGOGASTRODUODENOSCOPY (EGD) WITH PROPOFOL N/A 10/21/2017   Procedure: ESOPHAGOGASTRODUODENOSCOPY (EGD) WITH PROPOFOL;  Surgeon: Lollie Sails, MD;  Location: [facility] ENDOSCOPY;  Service: Endoscopy;  Laterality: N/A;   ESOPHAGOGASTRODUODENOSCOPY (EGD) WITH PROPOFOL N/A 04/15/2018   Procedure: ESOPHAGOGASTRODUODENOSCOPY (EGD) WITH PROPOFOL;  Surgeon: Lollie Sails, MD;  Location: [facility] END[facility]COPY;  Service: Endoscopy;  Laterality: N/A;   ESOPHAGUS SURGERY     CLOSURE   EYE SURGERY      HYSTERECTOMY ABDOMINAL WITH SALPINGECTOMY     IR GASTROSTOMY TUBE MOD SED  06/11/2018   IR GASTROSTOMY TUBE REMOVAL  03/25/2019   IR REPLACE G-TUBE SIMPLE WO FLUORO  10/19/2020   LOWER EXTREMITY ANGIOGRAPHY Left 09/18/2021   Procedure: LOWER EXTREMITY ANGIOGRAPHY;  Surgeon: Algernon Huxley, MD;  Location: [place] CV LAB;  Service: Cardiovascular;  Laterality: Left;   PERIPHERAL VASCULAR CATHETERIZATION N/A 01/23/2016   Procedure: Visceral Venography;  Surgeon: Algernon Huxley, MD;  Location: [place] CV LAB;  Service: Cardiovascular;  Laterality: N/A;   PERIPHERAL VASCULAR CATHETERIZATION  01/23/2016   Procedure: Peripheral Vascular Intervention;  Surgeon: Algernon Huxley, MD;  Location: [place] CV LAB;  Service: Cardiovascular;;   UPPER ESOPHAGEAL ENDOSCOPIC ULTRASOUND (EUS) N/A 12/05/2017   Procedure: UPPER ESOPHAGEAL ENDOSCOPIC ULTRASOUND (EUS);  Surgeon: Jola Schmidt, MD;  Location: [facility] ENDOSCOPY;  Service: Endoscopy;  Laterality: N/A;   VISCERAL ANGIOGRAPHY N/A 03/18/2017   Procedure: Visceral Angiography;  Surgeon: Algernon Huxley, MD;  Location: [place] CV LAB;  Service: Cardiovascular;  Laterality: N/A;   VISCERAL ARTERY INTERVENTION N/A 03/18/2017   Procedure: Visceral Artery Intervention;  Surgeon: Algernon Huxley, MD;  Location: [place] CV LAB;  Service: Cardiovascular;  Laterality: N/A;   Past Surgical History:  Procedure Laterality Date   ABDOMINAL HYSTERECTOMY     APPENDECTOMY     ARTHROGRAM KNEE Left    BACK SURGERY     CERVIVAL NECK FUSION   CATARACT EXTRACTION W/PHACO Left 11/11/2018   Procedure: CATARACT EXTRACTION PHACO AND INTRAOCULAR LENS PLACEMENT ([place]) LEFT, DIABETIC;  Surgeon: Birder Robson, MD;  Location: [facility] ORS;  Service: Ophthalmology;  Laterality: Left;  [place]  00:41 CDE 6.41 Fluid pack lot # [id number]   COLONOSCOPY WITH PROPOFOL N/A 06/20/2017   Procedure: COLONOSCOPY WITH PROPOFOL;  Surgeon: Lollie Sails, MD;  Location: [name] ENDOSCOPY;  Service:  Endoscopy;  Laterality: N/A;   ESOPHAGOGASTRODUODENOSCOPY (EGD) WITH PROPOFOL N/A 03/14/2015   Procedure: ESOPHAGOGASTRODUODENOSCOPY (EGD) WITH PROPOFOL;  Surgeon: Josefine Class, MD;  Location: [facility] ENDOSCOPY;  Service: Endoscopy;  Laterality: N/A;   ESOPHAGOGASTRODUODENOSCOPY (EGD) WITH PROPOFOL N/A 03/28/2017   Procedure: ESOPHAGOGASTRODUODENOSCOPY (EGD) WITH PROPOFOL;  Surgeon: Lollie Sails, MD;  Location: [facility] ENDOSCOPY;  Service: Endoscopy;  Laterality: N/A;   ESOPHAGOGASTRODUODENOSCOPY (EGD) WITH PROPOFOL N/A 06/20/2017   Procedure: ESOPHAGOGASTRODUODENOSCOPY (EGD) WITH PROPOFOL;  Surgeon: Lollie Sails, MD;  Location: [facility] ENDOSCOPY;  Service: Endoscopy;  Laterality: N/A;   ESOPHAGOGASTRODUODENOSCOPY (EGD) WITH PROPOFOL N/A 10/21/2017   Procedure: ESOPHAGOGASTRODUODENOSCOPY (EGD) WITH PROPOFOL;  Surgeon: Lollie Sails, MD;  Location: [facility] ENDOSCOPY;  Service: Endoscopy;  Laterality: N/A;   ESOPHAGOGASTRODUODENOSCOPY (EGD) WITH PROPOFOL N/A 04/15/2018   Procedure: ESOPHAGOGASTRODUODENOSCOPY (EGD) WITH PROPOFOL;  Surgeon: Lollie Sails, MD;  Location: [facility] ENDOSCOPY;  Service: Endoscopy;  Laterality: N/A;   ESOPHAGUS SURGERY     CLOSURE   EYE SURGERY     HYSTERECTOMY ABDOMINAL WITH  SALPINGECTOMY     IR GASTROSTOMY TUBE MOD SED  06/11/2018   IR GASTROSTOMY TUBE REMOVAL  03/25/2019   IR REPLACE G-TUBE SIMPLE WO FLUORO  10/19/2020   LOWER EXTREMITY ANGIOGRAPHY Left 09/18/2021   Procedure: LOWER EXTREMITY ANGIOGRAPHY;  Surgeon: Algernon Huxley, MD;  Location: [place] CV LAB;  Service: Cardiovascular;  Laterality: Left;   PERIPHERAL VASCULAR CATHETERIZATION N/A 01/23/2016   Procedure: Visceral Venography;  Surgeon: Algernon Huxley, MD;  Location: [place] CV LAB;  Service: Cardiovascular;  Laterality: N/A;   PERIPHERAL VASCULAR CATHETERIZATION  01/23/2016   Procedure: Peripheral Vascular Intervention;  Surgeon: Algernon Huxley, MD;  Location: [place] CV LAB;  Service:  Cardiovascular;;   UPPER ESOPHAGEAL ENDOSCOPIC ULTRASOUND (EUS) N/A 12/05/2017   Procedure: UPPER ESOPHAGEAL ENDOSCOPIC ULTRASOUND (EUS);  Surgeon: Jola Schmidt, MD;  Location: [facility] ENDOSCOPY;  Service: Endoscopy;  Laterality: N/A;   VISCERAL ANGIOGRAPHY N/A 03/18/2017   Procedure: Visceral Angiography;  Surgeon: Algernon Huxley, MD;  Location: [place] CV LAB;  Service: Cardiovascular;  Laterality: N/A;   VISCERAL ARTERY INTERVENTION N/A 03/18/2017   Procedure: Visceral Artery Intervention;  Surgeon: Algernon Huxley, MD;  Location: [place] CV LAB;  Service: Cardiovascular;  Laterality: N/A;   Past Medical History:  Diagnosis Date   Anemia    IRON INFUSIONS   Aortic valve sclerosis    Arrhythmia    Arthritis    osteoarthritis   Basal cell carcinoma of skin    Brain tumor ([place])    Brain tumor ([place])    Cervical spine disease    CHF (congestive heart failure) (HCC)    Diabetes mellitus without complication (HCC)    Dysrhythmia    sinus arrhythmia   Esophageal stricture    severe, led to feeding tube placement in Sept 2019   Esophageal ulcer without bleeding    Gastrostomy tube dependent ([place])    DOES NOT EAT OR DRINK    GERD (gastroesophageal reflux disease)    Hearing loss    pt had hearing test  last month, left ear30% hearing due to stroke, 78% in right-normal for her age   History of kidney stones    Hyperlipemia    Hypertension    Kidney stones    Leaky heart valve    Meningioma (HCC)    Occlusive mesenteric ischemia (HCC)    Pulmonary hypertension (HCC)    RLS (restless legs syndrome)    Stroke (HCC)    Vertigo    BP 121/66    Pulse 85    Temp 97.7 F (36.5 C) (Oral)    Ht 5\' 4"  (1.626 m)    Wt 116 lb (52.6 kg)    SpO2 97%    BMI 19.91 kg/m   Opioid Risk Score:   Fall Risk Score:  `1  Depression screen PHQ 2/9  Depression screen [facility] 2/9 09/12/2021 08/10/2021 04/20/2021 01/19/2021 01/19/2021 01/08/2018  Decreased Interest 0 0 0 0 0 0  Down, Depressed, Hopeless 0  0 0 0 0 0  PHQ - 2 Score 0 0 0 0 0 0  Altered sleeping - - - 0 - -  Tired, decreased energy - - - 2 - -  Change in appetite - - - 0 - -  Feeling bad or failure about yourself  - - - 0 - -  Trouble concentrating - - - 0 - -  Moving slowly or fidgety/restless - - - 0 - -  Suicidal thoughts - - - 0 - -  PHQ-9 Score - - - 2 - -  Some recent data might be hidden       Review of Systems  Constitutional: Negative.   HENT: Negative.    Eyes: Negative.   Respiratory: Negative.    Endocrine: Negative.   Genitourinary: Negative.   Musculoskeletal:  Positive for gait problem.       Right leg pain  Skin: Negative.   Allergic/Immunologic: Negative.   Hematological: Negative.   Psychiatric/Behavioral: Negative.        Objective:   Physical Exam Vitals and nursing note reviewed.  Constitutional:      Appearance: Normal appearance.  Cardiovascular:     Rate and Rhythm: Normal rate and regular rhythm.     Pulses: Normal pulses.     Heart sounds: Normal heart sounds.  Pulmonary:     Effort: Pulmonary effort is normal.     Breath sounds: Normal breath sounds.  Abdominal:     General: Bowel sounds are normal.     Palpations: Abdomen is soft.     Comments: Peg-Tube Intact  Musculoskeletal:     Cervical back: Normal range of motion and neck supple.     Comments: Normal Muscle Bulk and Muscle Testing Reveals:  Upper Extremities: Right: Decreased ROM  90 Degrees and  Muscle Strength 3/5 Left Upper Extremity: Full ROM and Muscle Strength 5/5  Lower Extremities: Right: Full ROM and Muscle Strength 4/5 Left Lower Extremity: Decreased ROM and Muscle Strength 4/5 Left Lower Extremity Flexion Produces Pain into her Left Lower Extremity Arrived in wheelchair     Skin:    General: Skin is warm and dry.  Neurological:     Mental Status: She is alert and oriented to person, place, and time.  Psychiatric:        Mood and Affect: Mood normal.        Behavior: Behavior normal.          Assessment & Plan:  Female with history of T2DM, CHF, DDD, mesenteric ischemia, esophageal stricture--on tube feeds who was admitted on [facility] on 12/17/20 with SOB due to pulmonary edema as well as right sided weakness with difficulty speaking due to acute/subacute L-MCA infarct as well as incidental small aneurysm v/s infudibulum.    1.  Right hemiparesis  secondary to L MCA stroke: Continue Home Exercise Program as Tolerated. Continue to Monitor.  Neurology Following. 10/17/2021   2. Essential Hypertension: Controlled. Continue current medication regimen. PCP Following. Continue to Monitor. 10/17/2021   3. Esophageal stricture/mesenteric ischemia: Continue Tube Feeds QID: PCP Following. 10/17/2021               4.  Controlled Type DM: Continue current medication regimen. PCP Following. Continue to Monitor. 10/17/2021   5. Gait abnormality: Continual use with Rolator. Continue Home Exercise Program as tolerated. . Continue to Monitor. -10/17/2021   F/U PRN

## 2021-10-24 ENCOUNTER — Other Ambulatory Visit (HOSPITAL_COMMUNITY): Payer: Self-pay

## 2021-10-24 ENCOUNTER — Encounter (HOSPITAL_COMMUNITY): Payer: Self-pay

## 2021-10-25 ENCOUNTER — Other Ambulatory Visit: Payer: Self-pay

## 2021-10-25 ENCOUNTER — Ambulatory Visit (INDEPENDENT_AMBULATORY_CARE_PROVIDER_SITE_OTHER): Payer: Medicare Other | Admitting: Nurse Practitioner

## 2021-10-25 ENCOUNTER — Ambulatory Visit (INDEPENDENT_AMBULATORY_CARE_PROVIDER_SITE_OTHER): Payer: Medicare Other

## 2021-10-25 VITALS — BP 137/67 | HR 80 | Ht 64.0 in | Wt 116.0 lb

## 2021-10-25 DIAGNOSIS — I739 Peripheral vascular disease, unspecified: Secondary | ICD-10-CM | POA: Diagnosis not present

## 2021-10-25 DIAGNOSIS — E1169 Type 2 diabetes mellitus with other specified complication: Secondary | ICD-10-CM | POA: Diagnosis not present

## 2021-10-25 DIAGNOSIS — E785 Hyperlipidemia, unspecified: Secondary | ICD-10-CM

## 2021-10-25 DIAGNOSIS — I70222 Atherosclerosis of native arteries of extremities with rest pain, left leg: Secondary | ICD-10-CM

## 2021-10-25 NOTE — Progress Notes (Signed)
Had a home visit with [name] with her daughter present.  She is down 1 lbs today and feeling fine.  Her foot is healing, she has been seeing vascular about her toes.  On the left foot her toes are red and on right foot they are fine.  She has no edema in lower extremities and abdomen is soft and non tender.  Her lungs are clear. She does very little oral intake, may eat a shake but takes 3 days to drink it.  She gets everything by her feeding tube.  There is always family with her, she weighs daily and they know how to adjust her fluid pill according to her weight and fluid.  They also watch her for dehydration and will give her additional fluids.  She stays at home except doctor appts.  She denies any chest pain, headaches, dizziness or increased shortness of breath.  Explained to the daughter about the program due to have not met her yet.  They denies needing anything and made sure my number was in the home if they have any problems.  Will continue to visit for heart failure, diet and medication management.   [place] [phone]

## 2021-11-11 ENCOUNTER — Encounter (INDEPENDENT_AMBULATORY_CARE_PROVIDER_SITE_OTHER): Payer: Self-pay | Admitting: Nurse Practitioner

## 2021-11-11 NOTE — Progress Notes (Signed)
Subjective:    Patient ID: [name], female    DOB: [birth year], 88 y.o.   MRN: [id number] Chief Complaint  Patient presents with   Follow-up    5wk post op with ABI     The patient returns to the office for followup and review of the noninvasive studies. There has been a significant deterioration in the lower extremity symptoms.  The patient notes interval shortening of their claudication distance and development of mild rest pain symptoms.  There have been new wounds that have occurred on her left toes since her intervention.  There have been no significant changes to the patient's overall health care.  The patient denies amaurosis fugax or recent TIA symptoms. There are no recent neurological changes noted. The patient denies history of DVT, PE or superficial thrombophlebitis. The patient denies recent episodes of angina or shortness of breath.   ABI's Rt=1.06 and Lt=1.33 (previous ABI's Rt=Andrews and Lt=1.06) Duplex US of the lower extremity arterial system shows monophasic waveforms on the right with monophasic/biphasic waveforms on the left.  The patient halls slightly dampened toe waveforms bilaterally.   Review of Systems  Skin:  Positive for wound.  All other systems reviewed and are negative.     Objective:   Physical Exam Vitals reviewed.  HENT:     Head: Normocephalic.  Cardiovascular:     Rate and Rhythm: Normal rate.     Pulses: Decreased pulses.  Pulmonary:     Effort: Pulmonary effort is normal.  Skin:    General: Skin is warm and dry.  Neurological:     Mental Status: She is alert and oriented to person, place, and time.  Psychiatric:        Mood and Affect: Mood normal.        Behavior: Behavior normal.        Thought Content: Thought content normal.        Judgment: Judgment normal.    BP 137/67    Pulse 80    Ht 5\' 4"  (1.626 m)    Wt 116 lb (52.6 kg)    BMI 19.91 kg/m   Past Medical History:  Diagnosis Date   Anemia    IRON INFUSIONS    Aortic valve sclerosis    Arrhythmia    Arthritis    osteoarthritis   Basal cell carcinoma of skin    Brain tumor (HCC)    Brain tumor (HCC)    Cervical spine disease    CHF (congestive heart failure) (HCC)    Diabetes mellitus without complication (HCC)    Dysrhythmia    sinus arrhythmia   Esophageal stricture    severe, led to feeding tube placement in Sept 2019   Esophageal ulcer without bleeding    Gastrostomy tube dependent (HCC)    DOES NOT EAT OR DRINK    GERD (gastroesophageal reflux disease)    Hearing loss    pt had hearing test  last month, left ear30% hearing due to stroke, 78% in right-normal for her age   History of kidney stones    Hyperlipemia    Hypertension    Kidney stones    Leaky heart valve    Meningioma (HCC)    Occlusive mesenteric ischemia (HCC)    Pulmonary hypertension (HCC)    RLS (restless legs syndrome)    Stroke (HCC)    Vertigo     Social History   Socioeconomic History   Marital status: Widowed    Spouse  name: Not on file   Number of children: Not on file   Years of education: Not on file   Highest education level: Not on file  Occupational History   Not on file  Tobacco Use   Smoking status: Never   Smokeless tobacco: Never  Vaping Use   Vaping Use: Never used  Substance and Sexual Activity   Alcohol use: Never   Drug use: Never   Sexual activity: Not Currently  Other Topics Concern   Not on file  Social History Narrative   Lives alone.    Daughter helps her.    Social Determinants of Health   Financial Resource Strain: Not on file  Food Insecurity: Not on file  Transportation Needs: Not on file  Physical Activity: Not on file  Stress: Not on file  Social Connections: Not on file  Intimate Partner Violence: Not on file    Past Surgical History:  Procedure Laterality Date   ABDOMINAL HYSTERECTOMY     APPENDECTOMY     ARTHROGRAM KNEE Left    BACK SURGERY     CERVIVAL NECK FUSION   CATARACT EXTRACTION W/PHACO  Left 11/11/2018   Procedure: CATARACT EXTRACTION PHACO AND INTRAOCULAR LENS PLACEMENT ([place]) LEFT, DIABETIC;  Surgeon: Birder Robson, MD;  Location: [facility] ORS;  Service: Ophthalmology;  Laterality: Left;  [place]  00:41 CDE 6.41 Fluid pack lot # [id number]   COLONOSCOPY WITH PROPOFOL N/A 06/20/2017   Procedure: COLONOSCOPY WITH PROPOFOL;  Surgeon: Lollie Sails, MD;  Location: [facility] ENDOSCOPY;  Service: Endoscopy;  Laterality: N/A;   ESOPHAGOGASTRODUODENOSCOPY (EGD) WITH PROPOFOL N/A 03/14/2015   Procedure: ESOPHAGOGASTRODUODENOSCOPY (EGD) WITH PROPOFOL;  Surgeon: Josefine Class, MD;  Location: [facility] ENDOSCOPY;  Service: Endoscopy;  Laterality: N/A;   ESOPHAGOGASTRODUODENOSCOPY (EGD) WITH PROPOFOL N/A 03/28/2017   Procedure: ESOPHAGOGASTRODUODENOSCOPY (EGD) WITH PROPOFOL;  Surgeon: Lollie Sails, MD;  Location: [facility]OSCOPY;  Service: Endoscopy;  Laterality: N/A;   ESOPHAGOGASTRODUODENOSCOPY (EGD) WITH PROPOFOL N/A 06/20/2017   Procedure: ESOPHAGOGASTRODUODENOSCOPY (EGD) WITH PROPOFOL;  Surgeon: Lollie Sails, MD;  Location: [facility] ENDOSCOPY;  Service: Endoscopy;  Laterality: N/A;   ESOPHAGOGASTRODUODENOSCOPY (EGD) WITH PROPOFOL N/A 10/21/2017   Procedure: ESOPHAGOGASTRODUODENOSCOPY (EGD) WITH PROPOFOL;  Surgeon: Lollie Sails, MD;  Location: [facility] ENDOSCOPY;  Service: Endoscopy;  Laterality: N/A;   ESOPHAGOGASTRODUODENOSCOPY (EGD) WITH PROPOFOL N/A 04/15/2018   Procedure: ESOPHAGOGASTRODUODENOSCOPY (EGD) WITH PROPOFOL;  Surgeon: Lollie Sails, MD;  Location: [facility] ENDOSCOPY;  Service: Endoscopy;  Laterality: N/A;   ESOPHAGUS SURGERY     CLOSURE   EYE SURGERY     HYSTERECTOMY ABDOMINAL WITH SALPINGECTOMY     IR GASTROSTOMY TUBE MOD SED  06/11/2018   IR GASTROSTOMY TUBE REMOVAL  03/25/2019   IR REPLACE G-TUBE SIMPLE WO FLUORO  10/19/2020   LOWER EXTREMITY ANGIOGRAPHY Left 09/18/2021   Procedure: LOWER EXTREMITY ANGIOGRAPHY;  Surgeon: Algernon Huxley, MD;  Location: [place] CV  LAB;  Service: Cardiovascular;  Laterality: Left;   PERIPHERAL VASCULAR CATHETERIZATION N/A 01/23/2016   Procedure: Visceral Venography;  Surgeon: Algernon Huxley, MD;  Location: [place] CV LAB;  Service: Cardiovascular;  Laterality: N/A;   PERIPHERAL VASCULAR CATHETERIZATION  01/23/2016   Procedure: Peripheral Vascular Intervention;  Surgeon: Algernon Huxley, MD;  Location: [place] CV LAB;  Service: Cardiovascular;;   UPPER ESOPHAGEAL ENDOSCOPIC ULTRASOUND (EUS) N/A 12/05/2017   Procedure: UPPER ESOPHAGEAL ENDOSCOPIC ULTRASOUND (EUS);  Surgeon: Jola Schmidt, MD;  Location: [facility] ENDOSCOPY;  Service: Endoscopy;  Laterality: N/A;   VISCERAL ANGIOGRAPHY N/A 03/18/2017  Procedure: Visceral Angiography;  Surgeon: Algernon Huxley, MD;  Location: [place] CV LAB;  Service: Cardiovascular;  Laterality: N/A;   VISCERAL ARTERY INTERVENTION N/A 03/18/2017   Procedure: Visceral Artery Intervention;  Surgeon: Algernon Huxley, MD;  Location: [place] CV LAB;  Service: Cardiovascular;  Laterality: N/A;    Family History  Problem Relation Age of Onset   Stroke Mother    Hypertension Mother    Heart disease Mother    Cancer Father    Heart disease Father    Heart attack Sister    Diabetes Sister    Heart attack Brother    Thyroid cancer Daughter    Cancer - Colon Daughter     Allergies  Allergen Reactions   Elemental Sulfur Diarrhea and Nausea And Vomiting   Gabapentin Swelling   Lipitor [Atorvastatin] Other (See Comments)    Muscle aches   Mevacor [Lovastatin] Other (See Comments)    Muscle aches   Milk-Related Compounds Other (See Comments)    Large quantities cause headaches    Septra [Sulfamethoxazole-Trimethoprim] Other (See Comments)    Unknown   Statins Other (See Comments)    Muscle pain   Sulfa Antibiotics Other (See Comments)   Zocor [Simvastatin] Other (See Comments)    Muscle aches    CBC Latest Ref Rng & Units 09/21/2021 09/04/2021 08/09/2021  WBC 4.0 - 10.5 K/uL 7.3 7.0  6.8  Hemoglobin 12.0 - 15.0 g/dL 12.2 10.6(L) 7.9(L)  Hematocrit 36.0 - 46.0 % 39.6 34.4(L) 25.5(L)  Platelets 150 - 400 K/uL 269 278 352      CMP     Component Value Date/Time   NA 142 09/24/2021 0531   K 3.9 09/24/2021 0531   K 4.1 12/23/2014 1008   CL 102 09/24/2021 0531   CO2 30 09/24/2021 0531   GLUCOSE 173 (H) 09/24/2021 0531   BUN 62 (H) 09/24/2021 0531   CREATININE 1.28 (H) 09/24/2021 0531   CREATININE 0.71 01/30/2018 1633   CALCIUM 9.1 09/24/2021 0531   PROT 6.6 05/16/2021 1244   ALBUMIN 3.7 05/16/2021 1244   AST 20 05/16/2021 1244   ALT 14 05/16/2021 1244   ALKPHOS 41 05/16/2021 1244   BILITOT 0.6 05/16/2021 1244   GFRNONAA 40 (L) 09/24/2021 0531   GFRAA 58 (L) 02/04/2020 0915     VAS [place] ABI WITH/WO TBI  Result Date: 10/30/2021  LOWER EXTREMITY DOPPLER STUDY Patient Name:  [name]  Date of Exam:   10/25/2021 Medical Rec #: [id number]         Accession #:    [id number] Date of Birth: 28-May-1929        Patient Gender: F Patient Age:   22 years Exam Location:  [facility] Procedure:      VAS [place] ABI WITH/WO TBI Referring Phys: --------------------------------------------------------------------------------  Indications: Peripheral artery disease.  Vascular Interventions: 09/18/2021 Left PTA of SFA/POP and calf vessels. Comparison Study: 09/12/2021 Performing Technologist: Concha Norway RVT  Examination Guidelines: A complete evaluation includes at minimum, Doppler waveform signals and systolic blood pressure reading at the level of bilateral brachial, anterior tibial, and posterior tibial arteries, when vessel segments are accessible. Bilateral testing is considered an integral part of a complete examination. Photoelectric Plethysmograph (PPG) waveforms and toe systolic pressure readings are included as required and additional duplex testing as needed. Limited examinations for reoccurring indications may be performed as noted.  ABI Findings:  +---------+------------------+-----+----------+--------+  Right     Rt Pressure (mmHg) Index Waveform  Comment   +---------+------------------+-----+----------+--------+  Brachial  130                                           +---------+------------------+-----+----------+--------+  ATA       138                1.06  monophasic           +---------+------------------+-----+----------+--------+  PTA       119                0.92  monophasic           +---------+------------------+-----+----------+--------+  Great Toe 73                 0.56  Abnormal             +---------+------------------+-----+----------+--------+ +---------+------------------+-----+----------+-------+  Left      Lt Pressure (mmHg) Index Waveform   Comment  +---------+------------------+-----+----------+-------+  ATA                                monophasic NonComp  +---------+------------------+-----+----------+-------+  PTA       173                1.33  biphasic            +---------+------------------+-----+----------+-------+  Great Toe 28                 0.22  Abnormal            +---------+------------------+-----+----------+-------+ +-------+-----------+-----------+------------+------------+  ABI/TBI Today's ABI Today's TBI Previous ABI Previous TBI  +-------+-----------+-----------+------------+------------+  Right   1.06        .56         [place]           .32           +-------+-----------+-----------+------------+------------+  Left    1.33        .22         1.06         .13           +-------+-----------+-----------+------------+------------+  Summary: Right: Resting right ankle-brachial index is within normal range. No evidence of significant right lower extremity arterial disease. The right toe-brachial index is abnormal. Compressible vessels and improved TBI compared to previous study. Left: Resting left ankle-brachial index is within normal range. No evidence of significant left lower extremity arterial disease. The left  toe-brachial index is abnormal. Increased flow and slightly improved TBI and digital waveforms in the left foot.  *See table(s) above for measurements and observations.  Electronically signed by [name] MD on 10/30/2021 at 10:22:43 AM.    Final        Assessment & Plan:   1. 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  Recommended she follow-up with podiatry for their input.  It is concerning that this lower extremity may need to really undergo intervention to help heal these wounds.  However given her advanced age we we will try to proceed as conservatively as possible.  We will have the patient return in 3 weeks to determine if there is any improvement in the wounds if  there is not we will discuss possible reintervention.  2. Type 2 diabetes mellitus with hyperlipidemia ([place]) Continue hypoglycemic medications as already ordered, these medications have been reviewed and there are no changes at this time.  Hgb A1C to be monitored as already arranged by primary service   3. Hyperlipidemia, unspecified hyperlipidemia type Continue statin as ordered and reviewed, no changes at this time    Current Outpatient Medications on File Prior to Visit  Medication Sig Dispense Refill   acetaminophen (TYLENOL) 160 MG/5ML solution Place 20.3 mLs (650 mg total) into feeding tube every 6 (six) hours as needed for mild pain. 120 mL 0   allopurinol (ZYLOPRIM) 100 MG tablet Take by mouth.     ascorbic acid (VITAMIN C) 250 MG tablet      aspirin 81 MG chewable tablet by Nasogastric route.     Bromfenac Sodium 0.09 % SOLN Place 1 drop into the left eye 2 (two) times daily.     carvedilol (COREG) 3.125 MG tablet Place 1 tablet (3.125 mg total) into feeding tube 2 (two) times daily with a meal. 30 tablet 3   clopidogrel (PLAVIX) 75 MG tablet GIVE 1  TABLET VIA FEEDING TUBE EVERY [name] AS DIRECTED 30 tablet 2   furosemide (LASIX) 20 MG tablet Place 1 tablet (20 mg total) into feeding tube 2 (two) times daily.     glipiZIDE (GLUCOTROL) 5 MG tablet Take 5 mg by mouth every morning.     hydrocerin (EUCERIN) CREA Apply 1 application topically 2 (two) times daily. To bilateral feet  0   Hypromellose 0.2 % SOLN Place 1 drop into both eyes 3 (three) times daily as needed (dry eyes).     lisinopril (ZESTRIL) 2.5 MG tablet Take 1 tablet by mouth daily.     loperamide (IMODIUM) 1 MG/5ML solution Take 1 mg by mouth as needed for diarrhea or loose stools.     metFORMIN (GLUCOPHAGE) 500 MG tablet Place 1 tablet (500 mg total) into feeding tube 2 (two) times daily with a meal. 30 tablet 1   Multiple Vitamin (MULTIVITAMIN) LIQD Place 15 mLs into feeding tube daily. 450 mL 1   Nutritional Supplements (FEEDING SUPPLEMENT, KATE FARMS STANDARD 1.4,) LIQD liquid Place 325 mLs into feeding tube 4 (four) times daily. (Patient taking differently: Place 325 mLs into feeding tube 3 (three) times daily between meals.)     pantoprazole sodium (PROTONIX) 40 mg/20 mL PACK Place 20 mLs (40 mg total) into feeding tube daily. 600 mL 0   polyethylene glycol (MIRALAX / GLYCOLAX) 17 g packet 1 packet mixed with 8 ounces of fluid     pregabalin (LYRICA) 25 MG capsule Take 1 capsule (25 mg total) by mouth 2 (two) times daily. (Patient taking differently: Take 25 mg by mouth daily.)     rOPINIRole (REQUIP) 2 MG tablet Place 1 tablet (2 mg total) into feeding tube 2 (two) times daily. 60 tablet 0   torsemide (DEMADEX) 20 MG tablet Take 40 mg by mouth 2 (two) times daily.     Torsemide 40 MG TABS Take by mouth.     Water For Irrigation, Sterile (FREE WATER) SOLN Place 120 mLs into feeding tube 4 (four) times daily. (Patient taking differently: Place 160 mLs into feeding tube 3 (three) times daily.)     No current facility-administered medications on file prior to visit.    There are  no Patient Instructions on file for this visit. No follow-ups on file.   Kris Hartmann,  NP

## 2021-11-11 NOTE — H&P (View-Only) (Signed)
Subjective:    Patient ID: [name], female    DOB: [birth year], 88 y.o.   MRN: [id number] Chief Complaint  Patient presents with   Follow-up    5wk post op with ABI     The patient returns to the office for followup and review of the noninvasive studies. There has been a significant deterioration in the lower extremity symptoms.  The patient notes interval shortening of their claudication distance and development of mild rest pain symptoms.  There have been new wounds that have occurred on her left toes since her intervention.  There have been no significant changes to the patient's overall health care.  The patient denies amaurosis fugax or recent TIA symptoms. There are no recent neurological changes noted. The patient denies history of DVT, PE or superficial thrombophlebitis. The patient denies recent episodes of angina or shortness of breath.   ABI's Rt=1.06 and Lt=1.33 (previous ABI's Rt=[place] and Lt=1.06) Duplex US of the lower extremity arterial system shows monophasic waveforms on the right with monophasic/biphasic waveforms on the left.  The patient halls slightly dampened toe waveforms bilaterally.   Review of Systems  Skin:  Positive for wound.  All other systems reviewed and are negative.     Objective:   Physical Exam Vitals reviewed.  HENT:     Head: Normocephalic.  Cardiovascular:     Rate and Rhythm: Normal rate.     Pulses: Decreased pulses.  Pulmonary:     Effort: Pulmonary effort is normal.  Skin:    General: Skin is warm and dry.  Neurological:     Mental Status: She is alert and oriented to person, place, and time.  Psychiatric:        Mood and Affect: Mood normal.        Behavior: Behavior normal.        Thought Content: Thought content normal.        Judgment: Judgment normal.    BP 137/67    Pulse 80    Ht 5\' 4"  (1.626 m)    Wt 116 lb (52.6 kg)    BMI 19.91 kg/m   Past Medical History:  Diagnosis Date   Anemia    IRON INFUSIONS    Aortic valve sclerosis    Arrhythmia    Arthritis    osteoarthritis   Basal cell carcinoma of skin    Brain tumor (HCC)    Brain tumor (HCC)    Cervical spine disease    CHF (congestive heart failure) (HCC)    Diabetes mellitus without complication (HCC)    Dysrhythmia    sinus arrhythmia   Esophageal stricture    severe, led to feeding tube placement in Sept 2019   Esophageal ulcer without bleeding    Gastrostomy tube dependent (HCC)    DOES NOT EAT OR DRINK    GERD (gastroesophageal reflux disease)    Hearing loss    pt had hearing test  last month, left ear30% hearing due to stroke, 78% in right-normal for her age   History of kidney stones    Hyperlipemia    Hypertension    Kidney stones    Leaky heart valve    Meningioma (HCC)    Occlusive mesenteric ischemia (HCC)    Pulmonary hypertension (HCC)    RLS (restless legs syndrome)    Stroke (HCC)    Vertigo     Social History   Socioeconomic History   Marital status: Widowed    Spouse  name: Not on file   Number of children: Not on file   Years of education: Not on file   Highest education level: Not on file  Occupational History   Not on file  Tobacco Use   Smoking status: Never   Smokeless tobacco: Never  Vaping Use   Vaping Use: Never used  Substance and Sexual Activity   Alcohol use: Never   Drug use: Never   Sexual activity: Not Currently  Other Topics Concern   Not on file  Social History Narrative   Lives alone.    Daughter helps her.    Social Determinants of Health   Financial Resource Strain: Not on file  Food Insecurity: Not on file  Transportation Needs: Not on file  Physical Activity: Not on file  Stress: Not on file  Social Connections: Not on file  Intimate Partner Violence: Not on file    Past Surgical History:  Procedure Laterality Date   ABDOMINAL HYSTERECTOMY     APPENDECTOMY     ARTHROGRAM KNEE Left    BACK SURGERY     CERVIVAL NECK FUSION   CATARACT EXTRACTION W/PHACO  Left 11/11/2018   Procedure: CATARACT EXTRACTION PHACO AND INTRAOCULAR LENS PLACEMENT ([place]) LEFT, DIABETIC;  Surgeon: Birder Robson, MD;  Location: [facility] ORS;  Service: Ophthalmology;  Laterality: Left;  [place]  00:41 CDE 6.41 Fluid pack lot # [id number]   COLONOSCOPY WITH PROPOFOL N/A 06/20/2017   Procedure: COLONOSCOPY WITH PROPOFOL;  Surgeon: Lollie Sails, MD;  Location: [facility] ENDOSCOPY;  Service: Endoscopy;  Laterality: N/A;   ESOPHAGOGASTRODUODENOSCOPY (EGD) WITH PROPOFOL N/A 03/14/2015   Procedure: ESOPHAGOGASTRODUODENOSCOPY (EGD) WITH PROPOFOL;  Surgeon: Josefine Class, MD;  Location: [facility] ENDOSCOPY;  Service: Endoscopy;  Laterality: N/A;   ESOPHAGOGASTRODUODENOSCOPY (EGD) WITH PROPOFOL N/A 03/28/2017   Procedure: ESOPHAGOGASTRODUODENOSCOPY (EGD) WITH PROPOFOL;  Surgeon: Lollie Sails, MD;  Location: [facility] ENDOSCOPY;  Service: Endoscopy;  Laterality: N/A;   ESOPHAGOGASTRODUODENOSCOPY (EGD) WITH PROPOFOL N/A 06/20/2017   Procedure: ESOPHAGOGASTRODUODENOSCOPY (EGD) WITH PROPOFOL;  Surgeon: Lollie Sails, MD;  Location: [facility] ENDOSCOPY;  Service: Endoscopy;  Laterality: N/A;   ESOPHAGOGASTRODUODENOSCOPY (EGD) WITH PROPOFOL N/A 10/21/2017   Procedure: ESOPHAGOGASTRODUODENOSCOPY (EGD) WITH PROPOFOL;  Surgeon: Lollie Sails, MD;  Location: [facility] ENDOSCOPY;  Service: Endoscopy;  Laterality: N/A;   ESOPHAGOGASTRODUODENOSCOPY (EGD) WITH PROPOFOL N/A 04/15/2018   Procedure: ESOPHAGOGASTRODUODENOSCOPY (EGD) WITH PROPOFOL;  Surgeon: Lollie Sails, MD;  Location: [facility] ENDOSCOPY;  Service: Endoscopy;  Laterality: N/A;   ESOPHAGUS SURGERY     CLOSURE   EYE SURGERY     HYSTERECTOMY ABDOMINAL WITH SALPINGECTOMY     IR GASTROSTOMY TUBE MOD SED  06/11/2018   IR GASTROSTOMY TUBE REMOVAL  03/25/2019   IR REPLACE G-TUBE SIMPLE WO FLUORO  10/19/2020   LOWER EXTREMITY ANGIOGRAPHY Left 09/18/2021   Procedure: LOWER EXTREMITY ANGIOGRAPHY;  Surgeon: Algernon Huxley, MD;  Location: [place] CV  LAB;  Service: Cardiovascular;  Laterality: Left;   PERIPHERAL VASCULAR CATHETERIZATION N/A 01/23/2016   Procedure: Visceral Venography;  Surgeon: Algernon Huxley, MD;  Location: [place] CV LAB;  Service: Cardiovascular;  Laterality: N/A;   PERIPHERAL VASCULAR CATHETERIZATION  01/23/2016   Procedure: Peripheral Vascular Intervention;  Surgeon: Algernon Huxley, MD;  Location: [place] CV LAB;  Service: Cardiovascular;;   UPPER ESOPHAGEAL ENDOSCOPIC ULTRASOUND (EUS) N/A 12/05/2017   Procedure: UPPER ESOPHAGEAL ENDOSCOPIC ULTRASOUND (EUS);  Surgeon: Jola Schmidt, MD;  Location: [facility] ENDOSCOPY;  Service: Endoscopy;  Laterality: N/A;   VISCERAL ANGIOGRAPHY N/A 03/18/2017  Procedure: Visceral Angiography;  Surgeon: Algernon Huxley, MD;  Location: [place] CV LAB;  Service: Cardiovascular;  Laterality: N/A;   VISCERAL ARTERY INTERVENTION N/A 03/18/2017   Procedure: Visceral Artery Intervention;  Surgeon: Algernon Huxley, MD;  Location: [place] CV LAB;  Service: Cardiovascular;  Laterality: N/A;    Family History  Problem Relation Age of Onset   Stroke Mother    Hypertension Mother    Heart disease Mother    Cancer Father    Heart disease Father    Heart attack Sister    Diabetes Sister    Heart attack Brother    Thyroid cancer Daughter    Cancer - Colon Daughter     Allergies  Allergen Reactions   Elemental Sulfur Diarrhea and Nausea And Vomiting   Gabapentin Swelling   Lipitor [Atorvastatin] Other (See Comments)    Muscle aches   Mevacor [Lovastatin] Other (See Comments)    Muscle aches   Milk-Related Compounds Other (See Comments)    Large quantities cause headaches    Septra [Sulfamethoxazole-Trimethoprim] Other (See Comments)    Unknown   Statins Other (See Comments)    Muscle pain   Sulfa Antibiotics Other (See Comments)   Zocor [Simvastatin] Other (See Comments)    Muscle aches    CBC Latest Ref Rng & Units 09/21/2021 09/04/2021 08/09/2021  WBC 4.0 - 10.5 K/uL 7.3 7.0  6.8  Hemoglobin 12.0 - 15.0 g/dL 12.2 10.6(L) 7.9(L)  Hematocrit 36.0 - 46.0 % 39.6 34.4(L) 25.5(L)  Platelets 150 - 400 K/uL 269 278 352      CMP     Component Value Date/Time   NA 142 09/24/2021 0531   K 3.9 09/24/2021 0531   K 4.1 12/23/2014 1008   CL 102 09/24/2021 0531   CO2 30 09/24/2021 0531   GLUCOSE 173 (H) 09/24/2021 0531   BUN 62 (H) 09/24/2021 0531   CREATININE 1.28 (H) 09/24/2021 0531   CREATININE 0.71 01/30/2018 1633   CALCIUM 9.1 09/24/2021 0531   PROT 6.6 05/16/2021 1244   ALBUMIN 3.7 05/16/2021 1244   AST 20 05/16/2021 1244   ALT 14 05/16/2021 1244   ALKPHOS 41 05/16/2021 1244   BILITOT 0.6 05/16/2021 1244   GFRNONAA 40 (L) 09/24/2021 0531   GFRAA 58 (L) 02/04/2020 0915     VAS [place] ABI WITH/WO TBI  Result Date: 10/30/2021  LOWER EXTREMITY DOPPLER STUDY Patient Name:  [name]  Date of Exam:   10/25/2021 Medical Rec #: [id number]         Accession #:    [id number] Date of Birth: [birth year]        Patient Gender: F Patient Age:   24 years Exam Location:  [facility] Procedure:      VAS [place] ABI WITH/WO TBI Referring Phys: --------------------------------------------------------------------------------  Indications: Peripheral artery disease.  Vascular Interventions: 09/18/2021 Left PTA of SFA/POP and calf vessels. Comparison Study: 09/12/2021 Performing Technologist: Concha Norway RVT  Examination Guidelines: A complete evaluation includes at minimum, Doppler waveform signals and systolic blood pressure reading at the level of bilateral brachial, anterior tibial, and posterior tibial arteries, when vessel segments are accessible. Bilateral testing is considered an integral part of a complete examination. Photoelectric Plethysmograph (PPG) waveforms and toe systolic pressure readings are included as required and additional duplex testing as needed. Limited examinations for reoccurring indications may be performed as noted.  ABI Findings:  +---------+------------------+-----+----------+--------+  Right     Rt Pressure (mmHg) Index Waveform  Comment   +---------+------------------+-----+----------+--------+  Brachial  130                                           +---------+------------------+-----+----------+--------+  ATA       138                1.06  monophasic           +---------+------------------+-----+----------+--------+  PTA       119                0.92  monophasic           +---------+------------------+-----+----------+--------+  Great Toe 73                 0.56  Abnormal             +---------+------------------+-----+----------+--------+ +---------+------------------+-----+----------+-------+  Left      Lt Pressure (mmHg) Index Waveform   Comment  +---------+------------------+-----+----------+-------+  ATA                                monophasic NonComp  +---------+------------------+-----+----------+-------+  PTA       173                1.33  biphasic            +---------+------------------+-----+----------+-------+  Great Toe 28                 0.22  Abnormal            +---------+------------------+-----+----------+-------+ +-------+-----------+-----------+------------+------------+  ABI/TBI Today's ABI Today's TBI Previous ABI Previous TBI  +-------+-----------+-----------+------------+------------+  Right   1.06        .56         [place]           .32           +-------+-----------+-----------+------------+------------+  Left    1.33        .22         1.06         .13           +-------+-----------+-----------+------------+------------+  Summary: Right: Resting right ankle-brachial index is within normal range. No evidence of significant right lower extremity arterial disease. The right toe-brachial index is abnormal. Compressible vessels and improved TBI compared to previous study. Left: Resting left ankle-brachial index is within normal range. No evidence of significant left lower extremity arterial disease. The left  toe-brachial index is abnormal. Increased flow and slightly improved TBI and digital waveforms in the left foot.  *See table(s) above for measurements and observations.  Electronically signed by [name] MD on 10/30/2021 at 10:22:43 AM.    Final        Assessment & Plan:   1. Atherosclerosis of native artery of left lower extremity with rest pain [facility]) Despite recent intervention on the left lower extremity the patient has developed 2 new wounds that are painful for the patient.  Recommended she follow-up with podiatry for their input.  It is concerning that this lower extremity may need to really undergo intervention to help heal these wounds.  However given her advanced age we we will try to proceed as conservatively as possible.  We will have the patient return in 3 weeks to determine if there is any improvement in the wounds if  there is not we will discuss possible reintervention.  2. Type 2 diabetes mellitus with hyperlipidemia ([place]) Continue hypoglycemic medications as already ordered, these medications have been reviewed and there are no changes at this time.  Hgb A1C to be monitored as already arranged by primary service   3. Hyperlipidemia, unspecified hyperlipidemia type Continue statin as ordered and reviewed, no changes at this time    Current Outpatient Medications on File Prior to Visit  Medication Sig Dispense Refill   acetaminophen (TYLENOL) 160 MG/5ML solution Place 20.3 mLs (650 mg total) into feeding tube every 6 (six) hours as needed for mild pain. 120 mL 0   allopurinol (ZYLOPRIM) 100 MG tablet Take by mouth.     ascorbic acid (VITAMIN C) 250 MG tablet      aspirin 81 MG chewable tablet by Nasogastric route.     Bromfenac Sodium 0.09 % SOLN Place 1 drop into the left eye 2 (two) times daily.     carvedilol (COREG) 3.125 MG tablet Place 1 tablet (3.125 mg total) into feeding tube 2 (two) times daily with a meal. 30 tablet 3   clopidogrel (PLAVIX) 75 MG tablet GIVE 1  TABLET VIA FEEDING TUBE EVERY [name] AS DIRECTED 30 tablet 2   furosemide (LASIX) 20 MG tablet Place 1 tablet (20 mg total) into feeding tube 2 (two) times daily.     glipiZIDE (GLUCOTROL) 5 MG tablet Take 5 mg by mouth every morning.     hydrocerin (EUCERIN) CREA Apply 1 application topically 2 (two) times daily. To bilateral feet  0   Hypromellose 0.2 % SOLN Place 1 drop into both eyes 3 (three) times daily as needed (dry eyes).     lisinopril (ZESTRIL) 2.5 MG tablet Take 1 tablet by mouth daily.     loperamide (IMODIUM) 1 MG/5ML solution Take 1 mg by mouth as needed for diarrhea or loose stools.     metFORMIN (GLUCOPHAGE) 500 MG tablet Place 1 tablet (500 mg total) into feeding tube 2 (two) times daily with a meal. 30 tablet 1   Multiple Vitamin (MULTIVITAMIN) LIQD Place 15 mLs into feeding tube daily. 450 mL 1   Nutritional Supplements (FEEDING SUPPLEMENT, KATE FARMS STANDARD 1.4,) LIQD liquid Place 325 mLs into feeding tube 4 (four) times daily. (Patient taking differently: Place 325 mLs into feeding tube 3 (three) times daily between meals.)     pantoprazole sodium (PROTONIX) 40 mg/20 mL PACK Place 20 mLs (40 mg total) into feeding tube daily. 600 mL 0   polyethylene glycol (MIRALAX / GLYCOLAX) 17 g packet 1 packet mixed with 8 ounces of fluid     pregabalin (LYRICA) 25 MG capsule Take 1 capsule (25 mg total) by mouth 2 (two) times daily. (Patient taking differently: Take 25 mg by mouth daily.)     rOPINIRole (REQUIP) 2 MG tablet Place 1 tablet (2 mg total) into feeding tube 2 (two) times daily. 60 tablet 0   torsemide (DEMADEX) 20 MG tablet Take 40 mg by mouth 2 (two) times daily.     Torsemide 40 MG TABS Take by mouth.     Water For Irrigation, Sterile (FREE WATER) SOLN Place 120 mLs into feeding tube 4 (four) times daily. (Patient taking differently: Place 160 mLs into feeding tube 3 (three) times daily.)     No current facility-administered medications on file prior to visit.    There are  no Patient Instructions on file for this visit. No follow-ups on file.   Kris Hartmann,  NP

## 2021-11-15 ENCOUNTER — Ambulatory Visit (INDEPENDENT_AMBULATORY_CARE_PROVIDER_SITE_OTHER): Payer: Medicare Other

## 2021-11-15 ENCOUNTER — Other Ambulatory Visit: Payer: Self-pay

## 2021-11-15 ENCOUNTER — Other Ambulatory Visit (INDEPENDENT_AMBULATORY_CARE_PROVIDER_SITE_OTHER): Payer: Self-pay | Admitting: Nurse Practitioner

## 2021-11-15 ENCOUNTER — Encounter (INDEPENDENT_AMBULATORY_CARE_PROVIDER_SITE_OTHER): Payer: Self-pay | Admitting: Nurse Practitioner

## 2021-11-15 ENCOUNTER — Ambulatory Visit (INDEPENDENT_AMBULATORY_CARE_PROVIDER_SITE_OTHER): Payer: Medicare Other | Admitting: Nurse Practitioner

## 2021-11-15 VITALS — BP 130/67 | HR 81 | Resp 16 | Wt 114.2 lb

## 2021-11-15 DIAGNOSIS — I739 Peripheral vascular disease, unspecified: Secondary | ICD-10-CM

## 2021-11-15 DIAGNOSIS — I10 Essential (primary) hypertension: Secondary | ICD-10-CM | POA: Diagnosis not present

## 2021-11-15 DIAGNOSIS — E785 Hyperlipidemia, unspecified: Secondary | ICD-10-CM

## 2021-11-15 DIAGNOSIS — E1169 Type 2 diabetes mellitus with other specified complication: Secondary | ICD-10-CM

## 2021-11-15 DIAGNOSIS — I70222 Atherosclerosis of native arteries of extremities with rest pain, left leg: Secondary | ICD-10-CM | POA: Diagnosis not present

## 2021-11-15 DIAGNOSIS — Z9862 Peripheral vascular angioplasty status: Secondary | ICD-10-CM

## 2021-11-15 MED ORDER — AMOXICILLIN-POT CLAVULANATE 500-125 MG PO TABS: 1.0000 | ORAL_TABLET | Freq: Two times a day (BID) | ORAL | 0 refills | Status: DC

## 2021-11-16 ENCOUNTER — Encounter (INDEPENDENT_AMBULATORY_CARE_PROVIDER_SITE_OTHER): Payer: Self-pay | Admitting: Nurse Practitioner

## 2021-11-17 ENCOUNTER — Telehealth (INDEPENDENT_AMBULATORY_CARE_PROVIDER_SITE_OTHER): Payer: Self-pay

## 2021-11-17 NOTE — Telephone Encounter (Signed)
Spoke with patient's daughter and she is scheduled with Dr. Lucky Cowboy for a LLE angio on 11/23/21 with a 9:30 am arrival time to the MM. Pre-procedure instructions were discussed and will be mailed.

## 2021-11-18 ENCOUNTER — Observation Stay
Admission: EM | Admit: 2021-11-18 | Discharge: 2021-11-19 | Disposition: A | Discharge status: Home / Self Care | Payer: Medicare Other | Attending: Hospitalist | Admitting: Hospitalist

## 2021-11-18 ENCOUNTER — Other Ambulatory Visit: Payer: Self-pay

## 2021-11-18 ENCOUNTER — Emergency Department: Payer: Medicare Other

## 2021-11-18 DIAGNOSIS — Z79899 Other long term (current) drug therapy: Secondary | ICD-10-CM | POA: Diagnosis not present

## 2021-11-18 DIAGNOSIS — R2681 Unsteadiness on feet: Secondary | ICD-10-CM | POA: Insufficient documentation

## 2021-11-18 DIAGNOSIS — D539 Nutritional anemia, unspecified: Secondary | ICD-10-CM | POA: Diagnosis not present

## 2021-11-18 DIAGNOSIS — Z931 Gastrostomy status: Secondary | ICD-10-CM

## 2021-11-18 DIAGNOSIS — N1831 Chronic kidney disease, stage 3a: Secondary | ICD-10-CM | POA: Diagnosis not present

## 2021-11-18 DIAGNOSIS — J690 Pneumonitis due to inhalation of food and vomit: Principal | ICD-10-CM

## 2021-11-18 DIAGNOSIS — R7989 Other specified abnormal findings of blood chemistry: Secondary | ICD-10-CM | POA: Diagnosis present

## 2021-11-18 DIAGNOSIS — J9601 Acute respiratory failure with hypoxia: Secondary | ICD-10-CM | POA: Diagnosis present

## 2021-11-18 DIAGNOSIS — Z85841 Personal history of malignant neoplasm of brain: Secondary | ICD-10-CM | POA: Diagnosis not present

## 2021-11-18 DIAGNOSIS — Z7982 Long term (current) use of aspirin: Secondary | ICD-10-CM | POA: Diagnosis not present

## 2021-11-18 DIAGNOSIS — A419 Sepsis, unspecified organism: Secondary | ICD-10-CM

## 2021-11-18 DIAGNOSIS — Z7984 Long term (current) use of oral hypoglycemic drugs: Secondary | ICD-10-CM | POA: Diagnosis not present

## 2021-11-18 DIAGNOSIS — Z85828 Personal history of other malignant neoplasm of skin: Secondary | ICD-10-CM | POA: Insufficient documentation

## 2021-11-18 DIAGNOSIS — I5042 Chronic combined systolic (congestive) and diastolic (congestive) heart failure: Secondary | ICD-10-CM | POA: Diagnosis not present

## 2021-11-18 DIAGNOSIS — I739 Peripheral vascular disease, unspecified: Secondary | ICD-10-CM | POA: Diagnosis present

## 2021-11-18 DIAGNOSIS — Z8673 Personal history of transient ischemic attack (TIA), and cerebral infarction without residual deficits: Secondary | ICD-10-CM | POA: Insufficient documentation

## 2021-11-18 DIAGNOSIS — K222 Esophageal obstruction: Secondary | ICD-10-CM

## 2021-11-18 DIAGNOSIS — Z20822 Contact with and (suspected) exposure to covid-19: Secondary | ICD-10-CM | POA: Insufficient documentation

## 2021-11-18 DIAGNOSIS — R778 Other specified abnormalities of plasma proteins: Secondary | ICD-10-CM | POA: Diagnosis not present

## 2021-11-18 DIAGNOSIS — E1169 Type 2 diabetes mellitus with other specified complication: Secondary | ICD-10-CM | POA: Insufficient documentation

## 2021-11-18 DIAGNOSIS — E785 Hyperlipidemia, unspecified: Secondary | ICD-10-CM | POA: Diagnosis not present

## 2021-11-18 DIAGNOSIS — I130 Hypertensive heart and chronic kidney disease with heart failure and stage 1 through stage 4 chronic kidney disease, or unspecified chronic kidney disease: Secondary | ICD-10-CM | POA: Insufficient documentation

## 2021-11-18 DIAGNOSIS — R0603 Acute respiratory distress: Secondary | ICD-10-CM | POA: Diagnosis present

## 2021-11-18 DIAGNOSIS — I5020 Unspecified systolic (congestive) heart failure: Secondary | ICD-10-CM | POA: Diagnosis present

## 2021-11-18 LAB — URINALYSIS, COMPLETE (UACMP) WITH MICROSCOPIC
Bilirubin Urine: NEGATIVE
Glucose, UA: NEGATIVE mg/dL
Hgb urine dipstick: NEGATIVE
Ketones, ur: NEGATIVE mg/dL
Nitrite: NEGATIVE
Protein, ur: 30 mg/dL — AB
Specific Gravity, Urine: 1.019 (ref 1.005–1.030)
pH: 6 (ref 5.0–8.0)

## 2021-11-18 LAB — BLOOD GAS, ARTERIAL
Acid-Base Excess: 3.2 mmol/L — ABNORMAL HIGH (ref 0.0–2.0)
Bicarbonate: 30.6 mmol/L — ABNORMAL HIGH (ref 20.0–28.0)
O2 Content: 4 L/min
O2 Saturation: 93.2 %
Patient temperature: 37
pCO2 arterial: 58 mmHg — ABNORMAL HIGH (ref 32–48)
pH, Arterial: 7.33 — ABNORMAL LOW (ref 7.35–7.45)
pO2, Arterial: 66 mmHg — ABNORMAL LOW (ref 83–108)

## 2021-11-18 LAB — RESP PANEL BY RT-PCR (FLU A&B, COVID) ARPGX2
Influenza A by PCR: NEGATIVE
Influenza B by PCR: NEGATIVE
SARS Coronavirus 2 by RT PCR: NEGATIVE

## 2021-11-18 LAB — CBC WITH DIFFERENTIAL/PLATELET
Abs Immature Granulocytes: 0.15 10*3/uL — ABNORMAL HIGH (ref 0.00–0.07)
Basophils Absolute: 0.1 10*3/uL (ref 0.0–0.1)
Basophils Relative: 1 %
Eosinophils Absolute: 0.6 10*3/uL — ABNORMAL HIGH (ref 0.0–0.5)
Eosinophils Relative: 3 %
HCT: 44.5 % (ref 36.0–46.0)
Hemoglobin: 13.4 g/dL (ref 12.0–15.0)
Immature Granulocytes: 1 %
Lymphocytes Relative: 14 %
Lymphs Abs: 2.5 10*3/uL (ref 0.7–4.0)
MCH: 27.2 pg (ref 26.0–34.0)
MCHC: 30.1 g/dL (ref 30.0–36.0)
MCV: 90.4 fL (ref 80.0–100.0)
Monocytes Absolute: 0.6 10*3/uL (ref 0.1–1.0)
Monocytes Relative: 3 %
Neutro Abs: 13.5 10*3/uL — ABNORMAL HIGH (ref 1.7–7.7)
Neutrophils Relative %: 78 %
Platelets: 413 10*3/uL — ABNORMAL HIGH (ref 150–400)
RBC: 4.92 MIL/uL (ref 3.87–5.11)
RDW: 16.7 % — ABNORMAL HIGH (ref 11.5–15.5)
WBC: 17.3 10*3/uL — ABNORMAL HIGH (ref 4.0–10.5)
nRBC: 0 % (ref 0.0–0.2)

## 2021-11-18 LAB — COMPREHENSIVE METABOLIC PANEL
ALT: 36 U/L (ref 0–44)
AST: 55 U/L — ABNORMAL HIGH (ref 15–41)
Albumin: 3.7 g/dL (ref 3.5–5.0)
Alkaline Phosphatase: 53 U/L (ref 38–126)
Anion gap: 10 (ref 5–15)
BUN: 85 mg/dL — ABNORMAL HIGH (ref 8–23)
CO2: 29 mmol/L (ref 22–32)
Calcium: 9.3 mg/dL (ref 8.9–10.3)
Chloride: 99 mmol/L (ref 98–111)
Creatinine, Ser: 1.3 mg/dL — ABNORMAL HIGH (ref 0.44–1.00)
GFR, Estimated: 39 mL/min — ABNORMAL LOW (ref >60)
Glucose, Bld: 297 mg/dL — ABNORMAL HIGH (ref 70–99)
Potassium: 4.9 mmol/L (ref 3.5–5.1)
Sodium: 138 mmol/L (ref 135–145)
Total Bilirubin: 0.5 mg/dL (ref 0.3–1.2)
Total Protein: 7.7 g/dL (ref 6.5–8.1)

## 2021-11-18 LAB — PROTIME-INR
INR: 0.9 (ref 0.8–1.2)
Prothrombin Time: 12.4 seconds (ref 11.4–15.2)

## 2021-11-18 LAB — BRAIN NATRIURETIC PEPTIDE: B Natriuretic Peptide: 1012.8 pg/mL — ABNORMAL HIGH (ref 0.0–100.0)

## 2021-11-18 LAB — GLUCOSE, CAPILLARY
Glucose-Capillary: 138 mg/dL — ABNORMAL HIGH (ref 70–99)
Glucose-Capillary: 201 mg/dL — ABNORMAL HIGH (ref 70–99)

## 2021-11-18 LAB — TROPONIN I (HIGH SENSITIVITY)
Troponin I (High Sensitivity): 32 ng/L — ABNORMAL HIGH (ref <18)
Troponin I (High Sensitivity): 67 ng/L — ABNORMAL HIGH (ref <18)

## 2021-11-18 LAB — LACTIC ACID, PLASMA
Lactic Acid, Venous: 3.3 mmol/L (ref 0.5–1.9)
Lactic Acid, Venous: 3.1 mmol/L (ref 0.5–1.9)

## 2021-11-18 LAB — PROCALCITONIN: Procalcitonin: <0.1 ng/mL

## 2021-11-18 LAB — APTT: aPTT: 29 seconds (ref 24–36)

## 2021-11-18 MED ORDER — LACTATED RINGERS IV SOLN: INTRAVENOUS | Status: DC

## 2021-11-18 MED ORDER — ASPIRIN 81 MG PO CHEW
81.0000 mg | CHEWABLE_TABLET | Freq: Every day | ORAL | Status: DC
  Administered 2021-11-18 – 2021-11-19 (×2): 81 mg
  Filled 2021-11-18 (×2): qty 1

## 2021-11-18 MED ORDER — OSMOLITE 1.2 CAL PO LIQD: 1000.0000 mL | ORAL | Status: DC

## 2021-11-18 MED ORDER — SODIUM CHLORIDE 0.9 % IV SOLN: 500.0000 mg | INTRAVENOUS | Status: DC

## 2021-11-18 MED ORDER — KATE FARMS STANDARD 1.4 PO LIQD
162.0000 mL | Freq: Every day | ORAL | Status: DC
  Administered 2021-11-18 – 2021-11-19 (×8): 162 mL
  Filled 2021-11-18: qty 325

## 2021-11-18 MED ORDER — ROPINIROLE HCL 1 MG PO TABS
2.0000 mg | ORAL_TABLET | Freq: Two times a day (BID) | ORAL | Status: DC
  Administered 2021-11-18 – 2021-11-19 (×3): 2 mg
  Filled 2021-11-18 (×3): qty 2

## 2021-11-18 MED ORDER — SODIUM CHLORIDE 0.9 % IV SOLN
500.0000 mg | INTRAVENOUS | Status: DC
  Administered 2021-11-18: 500 mg via INTRAVENOUS
  Filled 2021-11-18 (×2): qty 5

## 2021-11-18 MED ORDER — PREGABALIN 25 MG PO CAPS
25.0000 mg | ORAL_CAPSULE | Freq: Every evening | ORAL | Status: DC
  Administered 2021-11-18: 25 mg
  Filled 2021-11-18: qty 1

## 2021-11-18 MED ORDER — KATE FARMS STANDARD 1.4 PO LIQD
325.0000 mL | Freq: Four times a day (QID) | ORAL | Status: DC
  Filled 2021-11-18: qty 325

## 2021-11-18 MED ORDER — PANTOPRAZOLE 2 MG/ML SUSPENSION
40.0000 mg | Freq: Every day | ORAL | Status: DC
  Filled 2021-11-18 (×2): qty 20

## 2021-11-18 MED ORDER — ONDANSETRON HCL 4 MG/2ML IJ SOLN
4.0000 mg | Freq: Four times a day (QID) | INTRAMUSCULAR | Status: DC | PRN
  Administered 2021-11-19: 11:00:00 4 mg via INTRAVENOUS
  Filled 2021-11-18: qty 2

## 2021-11-18 MED ORDER — PANTOPRAZOLE SODIUM 40 MG PO PACK: 40.0000 mg | PACK | Freq: Every day | ORAL | Status: DC

## 2021-11-18 MED ORDER — SODIUM CHLORIDE 0.9 % IV SOLN
2.0000 g | INTRAVENOUS | Status: DC
  Administered 2021-11-18: 2 g via INTRAVENOUS
  Filled 2021-11-18 (×2): qty 20

## 2021-11-18 MED ORDER — FREE WATER: 150.0000 mL | Freq: Four times a day (QID) | Status: DC

## 2021-11-18 MED ORDER — ACETAMINOPHEN 325 MG PO TABS: 650.0000 mg | ORAL_TABLET | Freq: Four times a day (QID) | ORAL | Status: DC | PRN

## 2021-11-18 MED ORDER — ONDANSETRON HCL 4 MG PO TABS: 4.0000 mg | ORAL_TABLET | Freq: Four times a day (QID) | ORAL | Status: DC | PRN

## 2021-11-18 MED ORDER — SODIUM CHLORIDE 0.9 % IV SOLN: 2.0000 g | INTRAVENOUS | Status: DC

## 2021-11-18 MED ORDER — LACTATED RINGERS IV BOLUS (SEPSIS)
1000.0000 mL | Freq: Once | INTRAVENOUS | Status: AC
  Administered 2021-11-18: 1000 mL via INTRAVENOUS

## 2021-11-18 MED ORDER — ACETAMINOPHEN 160 MG/5ML PO SOLN
650.0000 mg | Freq: Four times a day (QID) | ORAL | Status: DC | PRN
  Administered 2021-11-18 – 2021-11-19 (×3): 650 mg
  Filled 2021-11-18 (×4): qty 20.3

## 2021-11-18 MED ORDER — ENOXAPARIN SODIUM 30 MG/0.3ML IJ SOSY
30.0000 mg | PREFILLED_SYRINGE | INTRAMUSCULAR | Status: DC
  Administered 2021-11-18 – 2021-11-19 (×2): 30 mg via SUBCUTANEOUS
  Filled 2021-11-18 (×2): qty 0.3

## 2021-11-18 MED ORDER — ALLOPURINOL 100 MG PO TABS
100.0000 mg | ORAL_TABLET | Freq: Every day | ORAL | Status: DC
  Administered 2021-11-18 – 2021-11-19 (×2): 100 mg
  Filled 2021-11-18 (×2): qty 1

## 2021-11-18 MED ORDER — FREE WATER
100.0000 mL | Freq: Every day | Status: DC
  Administered 2021-11-18 – 2021-11-19 (×8): 100 mL

## 2021-11-18 MED ORDER — CLOPIDOGREL BISULFATE 75 MG PO TABS
75.0000 mg | ORAL_TABLET | Freq: Every day | ORAL | Status: DC
  Administered 2021-11-18 – 2021-11-19 (×2): 75 mg
  Filled 2021-11-18 (×2): qty 1

## 2021-11-18 MED ORDER — CARVEDILOL 3.125 MG PO TABS
3.1250 mg | ORAL_TABLET | Freq: Two times a day (BID) | ORAL | Status: DC
  Administered 2021-11-18 – 2021-11-19 (×2): 3.125 mg
  Filled 2021-11-18 (×2): qty 1

## 2021-11-18 MED ORDER — ACETAMINOPHEN 650 MG RE SUPP: 650.0000 mg | Freq: Four times a day (QID) | RECTAL | Status: DC | PRN

## 2021-11-18 NOTE — Progress Notes (Signed)
Initial Nutrition Assessment  DOCUMENTATION CODES:   Not applicable  INTERVENTION:   Initiate bolus tube feeds via PEG: - 162 ml (half carton) [employer] Standard 1.4 formula 6 times daily (total of 3 full cartons daily) - 50 ml free water flush before and after each bolus feeding administration  Bolus tube feeding regimen provides 1365 kcal, 60 grams of protein, and 702 ml of H2O (meets 98% of estimated kcal needs and 92% of estimated protein needs).  Total free water with flushes: 1302 ml  NUTRITION DIAGNOSIS:   Inadequate oral intake related to inability to eat as evidenced by NPO status.  GOAL:   Patient will meet greater than or equal to 90% of their needs  MONITOR:   Labs, Weight trends, TF tolerance, I & O's  REASON FOR ASSESSMENT:   Consult Enteral/tube feeding initiation and management  ASSESSMENT:   88 year old female who presented to the ED on 2/18 with respiratory distress after episode of emesis and nosebleed. PMH of HTN, DM, prior CVA, PEG tube dependence secondary to esophageal stricture, combined CHF, CKD stage IIIa, aortic stenosis. Pt admitted with acute respiratory failure with hypoxia and sepsis secondary to aspiration pneumonia/pneumonitis.  RD working remotely.  Pt is familiar to RD team due to multiple previous admissions. Per review of previous RD notes, pt is PEG dependent and uses [employer] formula at home due to intolerance to Osmolite and Jevity formulas.  Discussed pt with RN. During previous admission, pt was tolerating 1 carton [employer] formula QID. Per RN, pt's daughter reports pt can no longer tolerate a full carton at a time and her home tube feeding regimen has changed to half of a carton 6 x daily. RD will order home tube feeding regimen.  Reviewed weight history in chart. Pt's weight is currently up to 61.1 kg and previously had been stable between 50-53 kg. Question accuracy of admission weight given pt's weight has been so stable  previously. Pt may be volume overloaded but unable to confirm without physical exam.  Pt previously met criteria for moderate chronic malnutrition. Suspect some degree of malnutrition persists but unable to confirm without NFPE.  Medications reviewed.  Labs reviewed: BUN 85, creatinine 1.30, lactic acid 3.1 (trending down)  NUTRITION - FOCUSED PHYSICAL EXAM:  Unable to complete at this time. RD working remotely.  Diet Order:   Diet Order             Diet NPO time specified  Diet effective now                   EDUCATION NEEDS:   No education needs have been identified at this time  Skin:  Skin Assessment: Reviewed RN Assessment  Last BM:  11/17/21  Height:   Ht Readings from Last 1 Encounters:  11/18/21 _0  (1.6 m)    Weight:   Wt Readings from Last 1 Encounters:  11/18/21 61.1 kg    BMI:  Body mass index is 23.86 kg/m.  Estimated Nutritional Needs:   Kcal:  1400-1600  Protein:  65-80 grams  Fluid:  1.4-1.6 L    Gustavus Bryant, MS, RD, LDN Inpatient Clinical Dietitian Please see AMiON for contact information.

## 2021-11-18 NOTE — ED Triage Notes (Signed)
Pt arrives from home via [facility]. EMS reports pt had a nose bleed and then vomited. EMS reports pt had difficulty breathing after episode of vomiting. Pt O2 sat was 79% on RA when found. Pt came in on 10L NRB with O2 sat 97%. Pt given 4mg  zofran by EMS. Pt has hx of dementia and is not oriented at baseline.

## 2021-11-18 NOTE — Progress Notes (Signed)
Anticoagulation monitoring(Lovenox):  88 yo female ordered Lovenox 40 mg Q24h    Filed Weights   11/18/21 0033  Weight: 61.1 kg (134 lb 11.2 oz)   BMI 23.86    Lab Results  Component Value Date   CREATININE 1.30 (H) 11/18/2021   CREATININE 1.28 (H) 09/24/2021   CREATININE 1.06 (H) 09/23/2021   Estimated Creatinine Clearance: 23.8 mL/min (A) (by C-G formula based on SCr of 1.3 mg/dL (H)). Hemoglobin & Hematocrit     Component Value Date/Time   HGB 13.4 11/18/2021 0034   HGB 12.3 12/23/2014 1008   HCT 44.5 11/18/2021 0034     Per Protocol for Patient with estCrcl < 30 ml/min and BMI < 40, will transition to Lovenox 30 mg Q24h.

## 2021-11-18 NOTE — Sepsis Progress Note (Signed)
Elink following Code Sepsis. 

## 2021-11-18 NOTE — Assessment & Plan Note (Deleted)
Secondary to aspiration pneumonia Supplemental oxygen to keep sats over 94% Treat pneumonia as outlined below

## 2021-11-18 NOTE — Progress Notes (Signed)
CODE SEPSIS - PHARMACY COMMUNICATION  **Broad Spectrum Antibiotics should be administered within 1 hour of Sepsis diagnosis**  Time Code Sepsis Called/Page Received:  2/18 @ 0150  Antibiotics Ordered: Ceftriaxone 2 gm , Azithromycin 500 mg   Time of 1st antibiotic administration: Ceftriaxone 2 gm IV X 1 on 2/18 @ 0202   Additional action taken by pharmacy:   If necessary, Name of Provider/Nurse Contacted:     Monserrate Blaschke D ,PharmD Clinical Pharmacist  11/18/2021  2:28 AM

## 2021-11-18 NOTE — ED Provider Notes (Signed)
[facility] Provider Note    Event Date/Time   First MD Initiated Contact with Patient 11/18/21 0032     (approximate)   History   Respiratory Distress   HPI  [name] is a 88 y.o. female who presents to the ED for evaluation of Respiratory Distress   I reviewed outpatient vascular surgery visit from 1/25.  History of PAD with rest pain, with a planned upcoming angiography next week.  DM, HLD, HTN.  DAPT with Plavix.  CHF. CVA. She has a history of PEG tube for the past few years due to esophageal stenosis resistant to multiple attempts at dilation, per the daughter.  Totally PEG tube dependent and does not take any p.o. I review DNR form on file that is current and active, as well as MOST form with limited scope of treatment recommended with IV fluids and consideration of BiPAP.  Patient resides at home with care provided by 2 daughters.  She is typically ambulatory with a walker handles many of her own ADLs.  Patient presents to the ED via EMS from home for valuation of difficulty breathing after emesis and nosebleed earlier this evening.  Patient is unable to provide any relevant history and just moans and complains of diffuse pain.  History is provided by her daughter who arrives shortly after patient does.  Daughter reports that patient seemed at her baseline when daughter arrived home from work around 12 or 6 PM.  Patient began complaining of dizziness and vertigo, which she often does, and daughter provided meclizine and Zofran.  Despite this, patient had multiple episodes of heaving and small-volume emesis of nonbloody nonbilious emesis.  Daughter reports that "I do not know when the nosebleed started."  Daughter reports that her breathing sounded like she was rattling and was having difficulty.  Due to this EMS was called.  Physical Exam   Triage Vital Signs: ED Triage Vitals [11/18/21 0033]  Enc Vitals Group     BP      Pulse      Resp       Temp      Temp src      SpO2      Weight 134 lb 11.2 oz (61.1 kg)     Height 5\' 3"  (1.6 m)     Head Circumference      Peak Flow      Pain Score      Pain Loc      Pain Edu?      Excl. in [place]?     Most recent vital signs: Vitals:   11/18/21 0100 11/18/21 0130  BP:  (!) 159/67  Pulse:  98  Resp:  (!) 21  Temp: 97.6 F (36.4 C)   SpO2:  97%    General: Somnolent, but arousable to loud vocal stimulation and sternal rub. CV:  Good peripheral perfusion.  Tachycardic and regular Resp:  Tachypneic to about 30.  Bibasilar crackles are present. Abd:  No distention.  G-tube in place MSK:  No deformity noted.  Neuro:  No focal deficits appreciated. Other:  Some dried blood on her shirt, but no active bleeding from the naris and no signs of posterior oropharyngeal bleeding   ED Results / Procedures / Treatments   Labs (all labs ordered are listed, but only abnormal results are displayed) Labs Reviewed  LACTIC ACID, PLASMA - Abnormal; Notable for the following components:      Result Value   Lactic  Acid, Venous 3.3 (*)    All other components within normal limits  COMPREHENSIVE METABOLIC PANEL - Abnormal; Notable for the following components:   Glucose, Bld 297 (*)    BUN 85 (*)    Creatinine, Ser 1.30 (*)    AST 55 (*)    GFR, Estimated 39 (*)    All other components within normal limits  CBC WITH DIFFERENTIAL/PLATELET - Abnormal; Notable for the following components:   WBC 17.3 (*)    RDW 16.7 (*)    Platelets 413 (*)    Neutro Abs 13.5 (*)    Eosinophils Absolute 0.6 (*)    Abs Immature Granulocytes 0.15 (*)    All other components within normal limits  BLOOD GAS, ARTERIAL - Abnormal; Notable for the following components:   pH, Arterial 7.33 (*)    pCO2 arterial 58 (*)    pO2, Arterial 66 (*)    Bicarbonate 30.6 (*)    Acid-Base Excess 3.2 (*)    All other components within normal limits  TROPONIN I (HIGH SENSITIVITY) - Abnormal; Notable for the following  components:   Troponin I (High Sensitivity) 32 (*)    All other components within normal limits  RESP PANEL BY RT-PCR (FLU A&B, COVID) ARPGX2  CULTURE, BLOOD (ROUTINE X 2)  CULTURE, BLOOD (ROUTINE X 2)  URINE CULTURE  PROTIME-INR  APTT  PROCALCITONIN  LACTIC ACID, PLASMA  URINALYSIS, COMPLETE (UACMP) WITH MICROSCOPIC  BRAIN NATRIURETIC PEPTIDE    EKG Sinus rhythm, rate of 110 bpm.  Normal axis.  QTc 485.  Incomplete left bundle morphology.  Lateral and inferior ST depressions and T wave inversions without STEMI.  Slight elevation of aVR.  Suggestive of global mild ischemia. Similar to EKG from December  RADIOLOGY 1 view CXR reviewed by me with pulmonary vascular congestion without clear pneumonitis or infiltrate  Official radiology report(s): DG Chest Port 1 View  Result Date: 11/18/2021 CLINICAL DATA:  Shortness of breath and possible aspiration pneumonia EXAM: PORTABLE CHEST 1 VIEW COMPARISON:  09/21/2021 FINDINGS: Cardiac shadow is enlarged but stable. Aortic calcifications are again seen. Previously seen effusions have cleared in the interval. Some residual increased vascular ingestion is noted. Lung markings are accentuated by patient rotation to the right. Postsurgical changes in the cervical spine are noted. IMPRESSION: Increased vascular congestion relatively stable from the prior exam. Previously seen effusions have resolved. Accentuation of the mediastinal markings related to patient rotation. Electronically Signed   By: Inez Catalina M.D.   On: 11/18/2021 00:51    PROCEDURES and INTERVENTIONS:  .1-3 Lead EKG Interpretation Performed by: Vladimir Crofts, MD Authorized by: Vladimir Crofts, MD     Interpretation: abnormal     ECG rate:  106   ECG rate assessment: tachycardic     Rhythm: sinus tachycardia     Ectopy: none     Conduction: normal   .Critical Care Performed by: Vladimir Crofts, MD Authorized by: Vladimir Crofts, MD   Critical care provider statement:    Critical  care time (minutes):  30   Critical care time was exclusive of:  Separately billable procedures and treating other patients   Critical care was necessary to treat or prevent imminent or life-threatening deterioration of the following conditions:  Sepsis and respiratory failure   Critical care was time spent personally by me on the following activities:  Development of treatment plan with patient or surrogate, discussions with consultants, evaluation of patient's response to treatment, examination of patient, ordering and review of laboratory  studies, ordering and review of radiographic studies, ordering and performing treatments and interventions, pulse oximetry, re-evaluation of patient's condition and review of old charts  Medications  lactated ringers infusion ( Intravenous New Bag/Given 11/18/21 0157)  lactated ringers bolus 1,000 mL (1,000 mLs Intravenous New Bag/Given 11/18/21 0156)  cefTRIAXone (ROCEPHIN) 2 g in sodium chloride 0.9 % 100 mL IVPB (2 g Intravenous New Bag/Given 11/18/21 0202)  azithromycin (ZITHROMAX) 500 mg in sodium chloride 0.9 % 250 mL IVPB (has no administration in time range)     IMPRESSION / MDM / ASSESSMENT AND PLAN / ED COURSE  I reviewed the triage vital signs and the nursing notes.  88 year old female presents from home with respiratory failure, possibly due to aspiration, with concurrent signs of sepsis and requiring medical admission.  She is tachycardic and hypoxic on room air requiring 4 L nasal cannula.  Remains hemodynamically stable without signs of shock.  She is disoriented and somnolent with tachypnea and bibasilar crackles.  X-ray without clear infiltrates, and has some mild congestion and she has a known history of CHF.  Blood work with leukocytosis and CKD around baseline.  Lactic acid is elevated but procalcitonin is negative.  Troponin is slightly elevated and likely secondary to her respiratory status.  EKG without STEMI.  We will trend the troponins.   She is meeting sepsis criteria with her vital sign derangements and leukocytosis, with her respiratory presentation, CAP coverage provided.  Most concerned about aspiration.  She is no evidence of continued bleeding in the ED.  We will consult with medicine for admission.  Clinical Course as of 11/18/21 3244  Sat Nov 18, 2021  0052 Daughter is now at the bedside.  She confirms DNR status.  She provides some supplemental history as patient cannot.  We discussed my concerns for aspiration and uncertain prognosis.  She expresses understanding and agreement. [DS]    Clinical Course User Index [DS] Vladimir Crofts, MD     FINAL CLINICAL IMPRESSION(S) / ED DIAGNOSES   Final diagnoses:  Acute respiratory failure with hypoxia ([place])  Aspiration pneumonia due to gastric secretions, unspecified laterality, unspecified part of lung ([place])  Sepsis with acute hypoxic respiratory failure without septic shock, due to unspecified organism [facility])     Rx / DC Orders   ED Discharge Orders     None        Note:  This document was prepared using Dragon voice recognition software and may include unintentional dictation errors.   Vladimir Crofts, MD 11/18/21 [phone]

## 2021-11-18 NOTE — ED Notes (Addendum)
Pt cleaned up. New brief put on. New purewick placed. Petroleum dressing placed on pts sacral area that was reddened

## 2021-11-18 NOTE — H&P (Signed)
History and Physical    Patient: [name] [name] KPV:[id number] DOB: Jul 28, 1929 DOA: 11/18/2021 DOS: the patient was seen and examined on 11/18/2021 PCP: [name], MD  Patient coming from: Home  Chief Complaint:  Chief Complaint  Patient presents with   Respiratory Distress    HPI: [name] is a 88 y.o. female with medical history significant of HTN, DM, prior CVA, gastrostomy secondary to esophageal stricture, combined CHF, CKD 3a and aortic stenosis who presents to the ED with acute respiratory distress that started shortly after an episode of spitting up and vomiting.  She was previously in her usual state of health.  O2 sat on arrival of EMS was 79% on room air.  Patient arrived on 10 L nonrebreather.  History is limited due to clinical condition  ED course: On arrival tachycardic at 106 and tachypneic to 25 with O2 sat 92% on 4 L.  BP 177/80 Blood work: WBC 17,000 with lactic acid 3.3, procalcitonin less than 0.10 ABG on 4 L: pH 7.33, PCO2 58 and PO2 66 Troponin 32 Creatinine 1.3 which is above baseline COVID and flu negative  EKG: Sinus tachycardia at 110 with no acute ST-T wave changes Chest x-ray: Increased vascular congestion relatively stable from prior exam from 09/21/2021  Patient started on IV fluid bolus for sepsis, Rocephin and azithromycin.  Hospitalist consulted for admission.  The ED provider spoke with patient's children who confirmed DNR status.   Review of Systems: Unable to obtain due to clinical condition Past Medical History:  Diagnosis Date   Anemia    IRON INFUSIONS   Aortic valve sclerosis    Arrhythmia    Arthritis    osteoarthritis   Basal cell carcinoma of skin    Brain tumor ([place])    Brain tumor ([place])    Cervical spine disease    CHF (congestive heart failure) (HCC)    Diabetes mellitus without complication (HCC)    Dysrhythmia    sinus arrhythmia   Esophageal stricture    severe, led to feeding tube placement in Sept 2019    Esophageal ulcer without bleeding    Gastrostomy tube dependent ([place])    DOES NOT EAT OR DRINK    GERD (gastroesophageal reflux disease)    Hearing loss    pt had hearing test  last month, left ear30% hearing due to stroke, 78% in right-normal for her age   History of kidney stones    Hyperlipemia    Hypertension    Kidney stones    Leaky heart valve    Meningioma (HCC)    Occlusive mesenteric ischemia (HCC)    Pulmonary hypertension (HCC)    RLS (restless legs syndrome)    Stroke ([place])    Vertigo    Past Surgical History:  Procedure Laterality Date   ABDOMINAL HYSTERECTOMY     APPENDECTOMY     ARTHROGRAM KNEE Left    BACK SURGERY     CERVIVAL NECK FUSION   CATARACT EXTRACTION W/PHACO Left 11/11/2018   Procedure: CATARACT EXTRACTION PHACO AND INTRAOCULAR LENS PLACEMENT ([place]) LEFT, DIABETIC;  Surgeon: Birder Robson, MD;  Location: [facility] ORS;  Service: Ophthalmology;  Laterality: Left;  [place]  00:41 CDE 6.41 Fluid pack lot # [id number]   COLONOSCOPY WITH PROPOFOL N/A 06/20/2017   Procedure: COLONOSCOPY WITH PROPOFOL;  Surgeon: Lollie Sails, MD;  Location: [facility] ENDOSCOPY;  Service: Endoscopy;  Laterality: N/A;   ESOPHAGOGASTRODUODENOSCOPY (EGD) WITH PROPOFOL N/A 03/14/2015   Procedure: ESOPHAGOGASTRODUODENOSCOPY (EGD) WITH  PROPOFOL;  Surgeon: Josefine Class, MD;  Location: [facility] ENDOSCOPY;  Service: Endoscopy;  Laterality: N/A;   ESOPHAGOGASTRODUODENOSCOPY (EGD) WITH PROPOFOL N/A 03/28/2017   Procedure: ESOPHAGOGASTRODUODENOSCOPY (EGD) WITH PROPOFOL;  Surgeon: Lollie Sails, MD;  Location: [facility] ENDOSCOPY;  Service: Endoscopy;  Laterality: N/A;   ESOPHAGOGASTRODUODENOSCOPY (EGD) WITH PROPOFOL N/A 06/20/2017   Procedure: ESOPHAGOGASTRODUODENOSCOPY (EGD) WITH PROPOFOL;  Surgeon: Lollie Sails, MD;  Location: [facility] ENDOSCOPY;  Service: Endoscopy;  Laterality: N/A;   ESOPHAGOGASTRODUODENOSCOPY (EGD) WITH PROPOFOL N/A 10/21/2017   Procedure: ESOPHAGOGASTRODUODENOSCOPY (EGD)  WITH PROPOFOL;  Surgeon: Lollie Sails, MD;  Location: [facility] ENDOSCOPY;  Service: Endoscopy;  Laterality: N/A;   ESOPHAGOGASTRODUODENOSCOPY (EGD) WITH PROPOFOL N/A 04/15/2018   Procedure: ESOPHAGOGASTRODUODENOSCOPY (EGD) WITH PROPOFOL;  Surgeon: Lollie Sails, MD;  Location: [facility] ENDOSCOPY;  Service: Endoscopy;  Laterality: N/A;   ESOPHAGUS SURGERY     CLOSURE   EYE SURGERY     HYSTERECTOMY ABDOMINAL WITH SALPINGECTOMY     IR GASTROSTOMY TUBE MOD SED  06/11/2018   IR GASTROSTOMY TUBE REMOVAL  03/25/2019   IR REPLACE G-TUBE SIMPLE WO FLUORO  10/19/2020   LOWER EXTREMITY ANGIOGRAPHY Left 09/18/2021   Procedure: LOWER EXTREMITY ANGIOGRAPHY;  Surgeon: Algernon Huxley, MD;  Location: [place] CV LAB;  Service: Cardiovascular;  Laterality: Left;   PERIPHERAL VASCULAR CATHETERIZATION N/A 01/23/2016   Procedure: Visceral Venography;  Surgeon: Algernon Huxley, MD;  Location: [place] CV LAB;  Service: Cardiovascular;  Laterality: N/A;   PERIPHERAL VASCULAR CATHETERIZATION  01/23/2016   Procedure: Peripheral Vascular Intervention;  Surgeon: Algernon Huxley, MD;  Location: [place] CV LAB;  Service: Cardiovascular;;   UPPER ESOPHAGEAL ENDOSCOPIC ULTRASOUND (EUS) N/A 12/05/2017   Procedure: UPPER ESOPHAGEAL ENDOSCOPIC ULTRASOUND (EUS);  Surgeon: Jola Schmidt, MD;  Location: [facility] ENDOSCOPY;  Service: Endoscopy;  Laterality: N/A;   VISCERAL ANGIOGRAPHY N/A 03/18/2017   Procedure: Visceral Angiography;  Surgeon: Algernon Huxley, MD;  Location: [place] CV LAB;  Service: Cardiovascular;  Laterality: N/A;   VISCERAL ARTERY INTERVENTION N/A 03/18/2017   Procedure: Visceral Artery Intervention;  Surgeon: Algernon Huxley, MD;  Location: [place] CV LAB;  Service: Cardiovascular;  Laterality: N/A;   Social History:  reports that she has never smoked. She has never used smokeless tobacco. She reports that she does not drink alcohol and does not use drugs.  Allergies  Allergen Reactions   Elemental Sulfur  Diarrhea and Nausea And Vomiting   Gabapentin Swelling   Lipitor [Atorvastatin] Other (See Comments)    Muscle aches   Mevacor [Lovastatin] Other (See Comments)    Muscle aches   Milk-Related Compounds Other (See Comments)    Large quantities cause headaches    Septra [Sulfamethoxazole-Trimethoprim] Other (See Comments)    Unknown   Statins Other (See Comments)    Muscle pain   Sulfa Antibiotics Other (See Comments)   Zocor [Simvastatin] Other (See Comments)    Muscle aches    Family History  Problem Relation Age of Onset   Stroke Mother    Hypertension Mother    Heart disease Mother    Cancer Father    Heart disease Father    Heart attack Sister    Diabetes Sister    Heart attack Brother    Thyroid cancer Daughter    Cancer - Colon Daughter     Prior to Admission medications   Medication Sig Start Date End Date Taking? Authorizing Provider  acetaminophen (TYLENOL) 160 MG/5ML solution Place 20.3 mLs (650 mg total)  into feeding tube every 6 (six) hours as needed for mild pain. 06/13/18   Demetrios Loll, MD  allopurinol (ZYLOPRIM) 100 MG tablet Take by mouth. 05/26/21 11/22/21  [provider]  amoxicillin-clavulanate (AUGMENTIN) 500-125 MG tablet Take 1 tablet (500 mg total) by mouth 2 (two) times daily. 11/15/21   Kris Hartmann, NP  ascorbic acid (VITAMIN C) 250 MG tablet  06/13/18   [provider]  aspirin 81 MG chewable tablet by Nasogastric route. 12/22/20   [provider]  Bromfenac Sodium 0.09 % SOLN Place 1 drop into the left eye 2 (two) times daily. 06/15/21   [provider]  carvedilol (COREG) 3.125 MG tablet Place 1 tablet (3.125 mg total) into feeding tube 2 (two) times daily with a meal. 12/22/20   Regalado, Belkys A, MD  clopidogrel (PLAVIX) 75 MG tablet GIVE 1 TABLET VIA FEEDING TUBE EVERY [name] AS DIRECTED 12/30/20   Love, Ivan Anchors, PA-C  furosemide (LASIX) 20 MG tablet Place 1 tablet (20 mg total) into feeding tube 2 (two) times daily.  08/09/21   Loletha Grayer, MD  glipiZIDE (GLUCOTROL) 5 MG tablet Take 5 mg by mouth every morning. 08/30/21   [provider]  hydrocerin (EUCERIN) CREA Apply 1 application topically 2 (two) times daily. To bilateral feet 12/27/20   Love, Ivan Anchors, PA-C  Hypromellose 0.2 % SOLN Place 1 drop into both eyes 3 (three) times daily as needed (dry eyes).    [provider]  lisinopril (ZESTRIL) 2.5 MG tablet Take 1 tablet by mouth daily. 07/21/21 01/17/22  [provider]  loperamide (IMODIUM) 1 MG/5ML solution Take 1 mg by mouth as needed for diarrhea or loose stools.    [provider]  metFORMIN (GLUCOPHAGE) 500 MG tablet Place 1 tablet (500 mg total) into feeding tube 2 (two) times daily with a meal. 02/17/21   Fritzi Mandes, MD  Multiple Vitamin (MULTIVITAMIN) LIQD Place 15 mLs into feeding tube daily. 06/13/18   Demetrios Loll, MD  Nutritional Supplements (FEEDING SUPPLEMENT, KATE FARMS STANDARD 1.4,) LIQD liquid Place 325 mLs into feeding tube 4 (four) times daily. Patient taking differently: Place 325 mLs into feeding tube 3 (three) times daily between meals. 12/30/20   Love, Ivan Anchors, PA-C  pantoprazole sodium (PROTONIX) 40 mg/20 mL PACK Place 20 mLs (40 mg total) into feeding tube daily. 12/30/20   Love, Ivan Anchors, PA-C  polyethylene glycol (MIRALAX / GLYCOLAX) 17 g packet 1 packet mixed with 8 ounces of fluid    [provider]  pregabalin (LYRICA) 25 MG capsule Take 1 capsule (25 mg total) by mouth 2 (two) times daily. Patient taking differently: Take 25 mg by mouth daily. 08/09/21   Loletha Grayer, MD  rOPINIRole (REQUIP) 2 MG tablet Place 1 tablet (2 mg total) into feeding tube 2 (two) times daily. 12/30/20   Love, Ivan Anchors, PA-C  SANTYL ointment Apply topically daily. 11/03/21   [provider]  torsemide (DEMADEX) 20 MG tablet Take 40 mg by mouth 2 (two) times daily. 10/12/21   [provider]  Torsemide 40 MG TABS Take by mouth. 10/11/21 10/11/22   [provider]  Water For Irrigation, Sterile (FREE WATER) SOLN Place 120 mLs into feeding tube 4 (four) times daily. Patient taking differently: Place 160 mLs into feeding tube 3 (three) times daily. 08/09/21   Loletha Grayer, MD    Physical Exam: Vitals:   11/18/21 0033 11/18/21 0100 11/18/21 0100 11/18/21 0130  BP: (!) 177/80 (!) 173/74  Marland Kitchen)  159/67  Pulse: (!) 106 (!) 108  98  Resp: (!) 25 (!) 24  (!) 21  Temp:   97.6 F (36.4 C)   TempSrc:   Oral   SpO2: 92% 93%  97%  Weight: 61.1 kg     Height: 5\' 3"  (1.6 m)      Physical Exam Vitals and nursing note reviewed.  Constitutional:      General: She is sleeping. She is not in acute distress.    Interventions: Nasal cannula in place.     Comments: Mild to moderate respiratory distress   HENT:     Head: Normocephalic and atraumatic.  Cardiovascular:     Rate and Rhythm: Regular rhythm. Tachycardia present.     Pulses: Normal pulses.     Heart sounds: Normal heart sounds. No murmur heard. Pulmonary:     Effort: Pulmonary effort is normal. Tachypnea present.     Breath sounds: Examination of the left-upper field reveals rales. Examination of the right-middle field reveals rales. Rales present. No wheezing or rhonchi.  Abdominal:     General: Bowel sounds are normal.     Palpations: Abdomen is soft.     Tenderness: There is no abdominal tenderness.  Musculoskeletal:        General: No swelling or tenderness. Normal range of motion.     Cervical back: Normal range of motion and neck supple.  Skin:    General: Skin is warm and dry.  Neurological:     General: No focal deficit present.     Mental Status: She is easily aroused. Mental status is at baseline.  Psychiatric:        Mood and Affect: Mood normal.        Behavior: Behavior normal.     Data Reviewed:   Assessment and Plan: No notes have been filed under this hospital service. Service: Hospitalist  Acute respiratory failure with hypoxia -  Secondary to aspiration pneumonia -Supplemental oxygen to keep sats over 94% -Treat pneumonia as outlined below  Sepsis secondary to aspiration pneumonia/pneumonitis - Continue sepsis fluids - Continue Rocephin and azithromycin - Supplemental oxygen to keep sats over 92% - Follow cultures  Chronic combined systolic and diastolic CHF  - Echocardiogram 10/26 with EF 40-45% and G2DD - Continue home lasix lisinopril spironolactone and Coreg -Monitor for fluid overload in view of IV sepsis fluids - Daily weights with intake and output monitoring    Elevated troponin, suspect demand ischemia -Troponin 32, EKG nonacute,  - Suspect demand ischemia related to CHF exacerbation - Continue to trend to peak   Chronic anemia - Patient follows with hematology, gets IV iron infusions - Has had blood transfusions in the past     Hypertension - Continue home lisinopril and Coreg - Hydralazine as needed for BP control     Type 2 diabetes mellitus with hyperlipidemia (HCC) - Sliding scale insulin coverage     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 Inpatient status is judged to be reasonable and necessary in order to provide the required intensity of service to ensure the patient's  safety. The patient's presenting symptoms, physical exam findings, and initial radiographic and laboratory data in the context of their chronic comorbidities is felt to place them at high risk for further clinical deterioration. Furthermore, it is not anticipated that the patient will be medically stable for  discharge from the hospital within 2 midnights of admission.   * I certify that at the point of admission it is my clinical judgment that the patient will require inpatient hospital care spanning beyond 2 midnights from the point of admission due to high intensity of service, high risk for further deterioration and high frequency of surveillance required.*  Author: Athena Masse, MD 11/18/2021 2:49 AM  For on call review [email or web address].

## 2021-11-19 DIAGNOSIS — J690 Pneumonitis due to inhalation of food and vomit: Secondary | ICD-10-CM | POA: Diagnosis not present

## 2021-11-19 LAB — CBC
HCT: 35.6 % — ABNORMAL LOW (ref 36.0–46.0)
Hemoglobin: 11 g/dL — ABNORMAL LOW (ref 12.0–15.0)
MCH: 27.7 pg (ref 26.0–34.0)
MCHC: 30.9 g/dL (ref 30.0–36.0)
MCV: 89.7 fL (ref 80.0–100.0)
Platelets: 287 10*3/uL (ref 150–400)
RBC: 3.97 MIL/uL (ref 3.87–5.11)
RDW: 16.7 % — ABNORMAL HIGH (ref 11.5–15.5)
WBC: 11.1 10*3/uL — ABNORMAL HIGH (ref 4.0–10.5)
nRBC: 0 % (ref 0.0–0.2)

## 2021-11-19 LAB — MAGNESIUM: Magnesium: 2.6 mg/dL — ABNORMAL HIGH (ref 1.7–2.4)

## 2021-11-19 LAB — BASIC METABOLIC PANEL
Anion gap: 8 (ref 5–15)
BUN: 52 mg/dL — ABNORMAL HIGH (ref 8–23)
CO2: 31 mmol/L (ref 22–32)
Calcium: 8.9 mg/dL (ref 8.9–10.3)
Chloride: 105 mmol/L (ref 98–111)
Creatinine, Ser: 0.97 mg/dL (ref 0.44–1.00)
GFR, Estimated: 56 mL/min — ABNORMAL LOW (ref >60)
Glucose, Bld: 187 mg/dL — ABNORMAL HIGH (ref 70–99)
Potassium: 4.8 mmol/L (ref 3.5–5.1)
Sodium: 144 mmol/L (ref 135–145)

## 2021-11-19 LAB — URINE CULTURE

## 2021-11-19 LAB — PROCALCITONIN: Procalcitonin: 1.13 ng/mL

## 2021-11-19 MED ORDER — MECLIZINE HCL 25 MG PO TABS: 25.0000 mg | ORAL_TABLET | Freq: Two times a day (BID) | ORAL | 1 refills | Status: AC | PRN

## 2021-11-19 MED ORDER — MECLIZINE HCL 25 MG PO TABS
25.0000 mg | ORAL_TABLET | Freq: Two times a day (BID) | ORAL | Status: DC | PRN
  Administered 2021-11-19: 12:00:00 25 mg via ORAL
  Filled 2021-11-19 (×2): qty 1

## 2021-11-19 MED ORDER — ENOXAPARIN SODIUM 40 MG/0.4ML IJ SOSY
40.0000 mg | PREFILLED_SYRINGE | INTRAMUSCULAR | Status: DC
Start: 2021-11-20 — End: 2021-11-19

## 2021-11-19 MED ORDER — MECLIZINE HCL 25 MG PO TABS
25.0000 mg | ORAL_TABLET | Freq: Two times a day (BID) | ORAL | Status: DC | PRN
  Filled 2021-11-19: qty 1

## 2021-11-19 MED ORDER — GLIPIZIDE 5 MG PO TABS: ORAL_TABLET | ORAL | Status: AC

## 2021-11-19 MED ORDER — TORSEMIDE 20 MG PO TABS
40.0000 mg | ORAL_TABLET | Freq: Every day | ORAL | Status: AC
Start: 2021-11-19 — End: ?

## 2021-11-19 MED ORDER — IPRATROPIUM-ALBUTEROL 0.5-2.5 (3) MG/3ML IN SOLN
3.0000 mL | Freq: Four times a day (QID) | RESPIRATORY_TRACT | Status: DC
  Administered 2021-11-19: 3 mL via RESPIRATORY_TRACT
  Filled 2021-11-19: qty 3

## 2021-11-19 MED ORDER — PREGABALIN 25 MG PO CAPS
25.0000 mg | ORAL_CAPSULE | Freq: Every day | ORAL | Status: AC
Start: 2021-11-19 — End: ?

## 2021-11-19 NOTE — Progress Notes (Signed)
After working with PT, patient c/o dizziness. Daughter states she takes meclizine at home. Dr. Billie Ruddy made aware. PRN Zofran given for nausea. Daughter remains at bedside.

## 2021-11-19 NOTE — Progress Notes (Signed)
Patient ambulated approx. 100 ft with walker on room air, maintained O2 sats between 89-94%, no c/o SOB. Patient to be discharged home with daughter. Daughter at bedside.

## 2021-11-19 NOTE — Discharge Summary (Signed)
Physician Discharge Summary   ALLEIGH MOLLICA Day  female DOB: October 08, 1928  EQA:[id number]  PCP: [name], MD  Admit date: 11/18/2021 Discharge date: 11/19/2021  Admitted From: home Disposition:  home Daughter updated at bedside prior to discharge.  Home Health: Yes CODE STATUS: DNR  Discharge Instructions     Discharge instructions   Complete by: As directed    Physical therapist couldn't finish testing you for BPPV because you started having a lot of dizziness, but initial finding suggests you may have BPPV.  If you want further testing, please have your PCP refer you to see ENT.  Your A1c has been normal at 5.4.  Please discuss with your PCP to consider stopping your glipizide.  Your cardiologist wanted you to take torsemide 40 mg daily.  Please take as directed.   Dr. Enzo Bi [facility] Course:  For full details, please see H&P, progress notes, consult notes and ancillary notes.  Briefly,  LANASIA PORRAS Day is a 88 y.o. female with medical history significant of HTN, DM, prior CVA, gastrostomy secondary to esophageal stricture, combined CHF, CKD 3a and aortic stenosis who presented to the ED with acute respiratory distress that started shortly after an episode of spitting up and vomiting.  She was previously in her usual state of health.  O2 sat on arrival of EMS was 79% on room air.    Acute respiratory failure with hypoxia 2/2 Aspiration pneumonitis --On arrival pt was tachypneic to 25 with O2 sat 92% on 4 L.   --CXR showed increased vascular congestion relatively stable, and no opacities to suggest aspiration PNA.   --by next day, pt's respiratory status improved, and ambulated approx. 100 ft with walker on room air, maintained O2 sats between 89-94%, no c/o SOB.  --pt and daughter both felt ready to go home on the day of discharge.   N/V 2/2 dizziness --Pt reported a long hx of having episodes of dizziness that causes severe nausea. --PT  attempted to eval for BPPV, however, pt started having severe dizziness and unable to complete the test.   --Pt was discharged on Meclizine PRN.  Sepsis, ruled out aspiration pneumonia, ruled out - started on Rocephin and azithromycin on admission, which were not continued.  Pt has aspiration pneumonitis that did not require abx treatment, and pt improved clinically quickly.   Chronic combined systolic and diastolic CHF  - Echocardiogram 10/26 with EF 40-45% and G2DD - Continue home lisinopril and Coreg --Per home med list, pt wasn't taking any diuretic at home.  CXR showed relatively stable but increased vascular congestion.  Per last cardiology clinic note, pt was prescribed torsemide 40 mg daily.  Pt was advised to take it as directed.   Elevated troponin, suspect demand ischemia -Troponin 32, 67, EKG nonacute,   Chronic anemia - Patient follows with hematology, gets IV iron infusions    Hypertension - Continue home lisinopril and Coreg --resume home torsemide    Type 2 diabetes mellitus, well controlled with hyperlipidemia ([place]) - recent A1c 5.3 in Nov 2022.   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 stricture/stenosis - cont tube feed     Right sided weakness secondary to old CVA -Continue aspirin and Plavix     Stage 3a chronic kidney disease ([place]) - Renal function at baseline    Discharge Diagnoses:  Principal Problem:   Aspiration pneumonia (  [place]) Active Problems:   PAD (peripheral artery disease) (HCC)   Esophageal stenosis   Gastrostomy tube in place ([place])   HFrEF (heart failure with reduced ejection fraction) (HCC)   Acute respiratory failure with hypoxia (HCC)   Elevated troponin   Sepsis ([place])   30 Day Unplanned Readmission Risk Score    Flowsheet Row ED to Hosp-Admission (Current) from 11/18/2021 in [place] (1C)  30 Day Unplanned Readmission Risk  Score (%) 32.78 Filed at 11/19/2021 1200       This score is the patient's risk of an unplanned readmission within 30 days of being discharged (0 -100%). The score is based on dignosis, age, lab data, medications, orders, and past utilization.   Low:  0-14.9   Medium: 15-21.9   High: 22-29.9   Extreme: 30 and above         Discharge Instructions:  Allergies as of 11/19/2021       Reactions   Elemental Sulfur Diarrhea, Nausea And Vomiting   Gabapentin Swelling   Lipitor [atorvastatin] Other (See Comments)   Muscle aches   Mevacor [lovastatin] Other (See Comments)   Muscle aches   Milk-related Compounds Other (See Comments)   Large quantities cause headaches    Septra [sulfamethoxazole-trimethoprim] Other (See Comments)   Unknown   Statins Other (See Comments)   Muscle pain   Sulfa Antibiotics Other (See Comments)   Zocor [simvastatin] Other (See Comments)   Muscle aches        Medication List     STOP taking these medications    amoxicillin-clavulanate 500-125 MG tablet Commonly known as: Augmentin   Bromfenac Sodium 0.09 % Soln   cefUROXime 250 MG tablet Commonly known as: CEFTIN   furosemide 10 MG/ML solution Commonly known as: LASIX   furosemide 20 MG tablet Commonly known as: LASIX   Santyl ointment Generic drug: collagenase       TAKE these medications    acetaminophen 160 MG/5ML solution Commonly known as: TYLENOL Place 20.3 mLs (650 mg total) into feeding tube every 6 (six) hours as needed for mild pain.   allopurinol 100 MG tablet Commonly known as: ZYLOPRIM Take by mouth.   aspirin 81 MG chewable tablet by Nasogastric route.   carvedilol 3.125 MG tablet Commonly known as: COREG Place 1 tablet (3.125 mg total) into feeding tube 2 (two) times daily with a meal.   clopidogrel 75 MG tablet Commonly known as: PLAVIX GIVE 1 TABLET VIA FEEDING TUBE EVERY DAY AS DIRECTED   feeding supplement (KATE FARMS STANDARD 1.4) Liqd liquid Place  325 mLs into feeding tube 4 (four) times daily. What changed: when to take this   free water Soln Place 120 mLs into feeding tube 4 (four) times daily. What changed:  how much to take when to take this   glipiZIDE 5 MG tablet Commonly known as: GLUCOTROL Consider discussing with your outpatient doctor to stop this medication. What changed:  how much to take how to take this when to take this additional instructions   hydrocerin Crea Apply 1 application topically 2 (two) times daily. To bilateral feet   Hypromellose 0.2 % Soln Place 1 drop into both eyes 3 (three) times daily as needed (dry eyes).   lisinopril 2.5 MG tablet Commonly known as: ZESTRIL Take 1 tablet by mouth daily.   loperamide 1 MG/5ML solution Commonly known as: IMODIUM Take 1 mg by mouth as needed for diarrhea or loose stools.   meclizine 25 MG tablet  Commonly known as: ANTIVERT Place 1 tablet (25 mg total) into feeding tube 2 (two) times daily as needed for dizziness.   metFORMIN 500 MG tablet Commonly known as: GLUCOPHAGE Place 1 tablet (500 mg total) into feeding tube 2 (two) times daily with a meal.   multivitamin Liqd Place 15 mLs into feeding tube daily.   pantoprazole sodium 40 mg Commonly known as: PROTONIX Place 20 mLs (40 mg total) into feeding tube daily.   pregabalin 25 MG capsule Commonly known as: LYRICA Take 1 capsule (25 mg total) by mouth daily. Home med. What changed:  when to take this additional instructions   rOPINIRole 2 MG tablet Commonly known as: REQUIP Place 1 tablet (2 mg total) into feeding tube 2 (two) times daily.   torsemide 20 MG tablet Commonly known as: DEMADEX Take 2 tablets (40 mg total) by mouth daily. Home med. What changed:  when to take this additional instructions Another medication with the same name was removed. Continue taking this medication, and follow the directions you see here.         Follow-up Information     [name],  MD Follow up in 1 week(s).   Specialty: Internal Medicine Contact information: [place] [address] [phone]                 Allergies  Allergen Reactions   Elemental Sulfur Diarrhea and Nausea And Vomiting   Gabapentin Swelling   Lipitor [Atorvastatin] Other (See Comments)    Muscle aches   Mevacor [Lovastatin] Other (See Comments)    Muscle aches   Milk-Related Compounds Other (See Comments)    Large quantities cause headaches    Septra [Sulfamethoxazole-Trimethoprim] Other (See Comments)    Unknown   Statins Other (See Comments)    Muscle pain   Sulfa Antibiotics Other (See Comments)   Zocor [Simvastatin] Other (See Comments)    Muscle aches     The results of significant diagnostics from this hospitalization (including imaging, microbiology, ancillary and laboratory) are listed below for reference.   Consultations:   Procedures/Studies: DG Chest Port 1 View  Result Date: 11/18/2021 CLINICAL DATA:  Shortness of breath and possible aspiration pneumonia EXAM: PORTABLE CHEST 1 VIEW COMPARISON:  09/21/2021 FINDINGS: Cardiac shadow is enlarged but stable. Aortic calcifications are again seen. Previously seen effusions have cleared in the interval. Some residual increased vascular ingestion is noted. Lung markings are accentuated by patient rotation to the right. Postsurgical changes in the cervical spine are noted. IMPRESSION: Increased vascular congestion relatively stable from the prior exam. Previously seen effusions have resolved. Accentuation of the mediastinal markings related to patient rotation. Electronically Signed   By: Inez Catalina M.D.   On: 11/18/2021 00:51   VAS [place] ABI WITH/WO TBI  Result Date: 10/30/2021  LOWER EXTREMITY DOPPLER STUDY Patient Name:  RHYANNA SORCE Day  Date of Exam:   10/25/2021 Medical Rec #: [id number]         Accession #:    [id number] Date of Birth: [birth year]        Patient Gender: F  Patient Age:   88 years Exam Location:  [facility] Procedure:      VAS [place] ABI WITH/WO TBI Referring Phys: --------------------------------------------------------------------------------  Indications: Peripheral artery disease.  Vascular Interventions: 09/18/2021 Left PTA of SFA/POP and calf vessels. Comparison Study: 09/12/2021 Performing Technologist: Concha Norway RVT  Examination Guidelines: A complete evaluation includes at minimum, Doppler waveform signals and  systolic blood pressure reading at the level of bilateral brachial, anterior tibial, and posterior tibial arteries, when vessel segments are accessible. Bilateral testing is considered an integral part of a complete examination. Photoelectric Plethysmograph (PPG) waveforms and toe systolic pressure readings are included as required and additional duplex testing as needed. Limited examinations for reoccurring indications may be performed as noted.  ABI Findings: +---------+------------------+-----+----------+--------+  Right     Rt Pressure (mmHg) Index Waveform   Comment   +---------+------------------+-----+----------+--------+  Brachial  130                                           +---------+------------------+-----+----------+--------+  ATA       138                1.06  monophasic           +---------+------------------+-----+----------+--------+  PTA       119                0.92  monophasic           +---------+------------------+-----+----------+--------+  Great Toe 73                 0.56  Abnormal             +---------+------------------+-----+----------+--------+ +---------+------------------+-----+----------+-------+  Left      Lt Pressure (mmHg) Index Waveform   Comment  +---------+------------------+-----+----------+-------+  ATA                                monophasic NonComp  +---------+------------------+-----+----------+-------+  PTA       173                1.33  biphasic             +---------+------------------+-----+----------+-------+  Great Toe 28                 0.22  Abnormal            +---------+------------------+-----+----------+-------+ +-------+-----------+-----------+------------+------------+  ABI/TBI Today's ABI Today's TBI Previous ABI Previous TBI  +-------+-----------+-----------+------------+------------+  Right   1.06        .56         [place]           .32           +-------+-----------+-----------+------------+------------+  Left    1.33        .22         1.06         .13           +-------+-----------+-----------+------------+------------+  Summary: Right: Resting right ankle-brachial index is within normal range. No evidence of significant right lower extremity arterial disease. The right toe-brachial index is abnormal. Compressible vessels and improved TBI compared to previous study. Left: Resting left ankle-brachial index is within normal range. No evidence of significant left lower extremity arterial disease. The left toe-brachial index is abnormal. Increased flow and slightly improved TBI and digital waveforms in the left foot.  *See table(s) above for measurements and observations.  Electronically signed by [name] MD on 10/30/2021 at 10:22:43 AM.    Final       Labs: BNP (last 3 results) Recent Labs    08/07/21 1645 09/21/21 2155 11/18/21 0036  BNP 1,311.1* 1,594.8* 7,858.8*   Basic Metabolic Panel: Recent  Labs  Lab 11/18/21 0034 11/19/21 0615  NA 138 144  K 4.9 4.8  CL 99 105  CO2 29 31  GLUCOSE 297* 187*  BUN 85* 52*  CREATININE 1.30* 0.97  CALCIUM 9.3 8.9  MG  --  2.6*   Liver Function Tests: Recent Labs  Lab 11/18/21 0034  AST 55*  ALT 36  ALKPHOS 53  BILITOT 0.5  PROT 7.7  ALBUMIN 3.7   No results for input(s): LIPASE, AMYLASE in the last 168 hours. No results for input(s): AMMONIA in the last 168 hours. CBC: Recent Labs  Lab 11/18/21 0034 11/19/21 0615  WBC 17.3* 11.1*  NEUTROABS 13.5*  --   HGB 13.4 11.0*  HCT  44.5 35.6*  MCV 90.4 89.7  PLT 413* 287   Cardiac Enzymes: No results for input(s): CKTOTAL, CKMB, CKMBINDEX, TROPONINI in the last 168 hours. BNP: Invalid input(s): POCBNP CBG: Recent Labs  Lab 11/18/21 1148 11/18/21 1622  GLUCAP 138* 201*   D-Dimer No results for input(s): DDIMER in the last 72 hours. Hgb A1c No results for input(s): HGBA1C in the last 72 hours. Lipid Profile No results for input(s): CHOL, HDL, LDLCALC, TRIG, CHOLHDL, LDLDIRECT in the last 72 hours. Thyroid function studies No results for input(s): TSH, T4TOTAL, T3FREE, THYROIDAB in the last 72 hours.  Invalid input(s): FREET3 Anemia work up No results for input(s): VITAMINB12, FOLATE, FERRITIN, TIBC, IRON, RETICCTPCT in the last 72 hours. Urinalysis    Component Value Date/Time   COLORURINE YELLOW (A) 11/18/2021 0825   APPEARANCEUR CLEAR (A) 11/18/2021 0825   LABSPEC 1.019 11/18/2021 0825   PHURINE 6.0 11/18/2021 0825   GLUCOSEU NEGATIVE 11/18/2021 0825   HGBUR NEGATIVE 11/18/2021 0825   BILIRUBINUR NEGATIVE 11/18/2021 0825   KETONESUR NEGATIVE 11/18/2021 0825   PROTEINUR 30 (A) 11/18/2021 0825   NITRITE NEGATIVE 11/18/2021 0825   LEUKOCYTESUR MODERATE (A) 11/18/2021 0825   Sepsis Labs Invalid input(s): PROCALCITONIN,  WBC,  LACTICIDVEN Microbiology Recent Results (from the past 240 hour(s))  Resp Panel by RT-PCR (Flu A&B, Covid) Nasopharyngeal Swab     Status: None   Collection Time: 11/18/21 12:36 AM   Specimen: Nasopharyngeal Swab; Nasopharyngeal(NP) swabs in vial transport medium  Result Value Ref Range Status   SARS Coronavirus 2 by RT PCR NEGATIVE NEGATIVE Final    Comment: (NOTE) SARS-CoV-2 target nucleic acids are NOT DETECTED.  The SARS-CoV-2 RNA is generally detectable in upper respiratory specimens during the acute phase of infection. The lowest concentration of SARS-CoV-2 viral copies this assay can detect is 138 copies/mL. A negative result does not preclude  SARS-Cov-2 infection and should not be used as the sole basis for treatment or other patient management decisions. A negative result may occur with  improper specimen collection/handling, submission of specimen other than nasopharyngeal swab, presence of viral mutation(s) within the areas targeted by this assay, and inadequate number of viral copies(<138 copies/mL). A negative result must be combined with clinical observations, patient history, and epidemiological information. The expected result is Negative.  Fact Sheet for Patients:  EntrepreneurPulse.com.au  Fact Sheet for Healthcare Providers:  IncredibleEmployment.be  This test is no t yet approved or cleared by the [place] FDA and  has been authorized for detection and/or diagnosis of SARS-CoV-2 by FDA under an Emergency Use Authorization (EUA). This EUA will remain  in effect (meaning this test can be used) for the duration of the COVID-19 declaration under Section 564(b)(1) of the Act, 21 U.S.C.section 360bbb-3(b)(1), unless the authorization is terminated  or revoked sooner.       Influenza A by PCR NEGATIVE NEGATIVE Final   Influenza B by PCR NEGATIVE NEGATIVE Final    Comment: (NOTE) The Xpert Xpress SARS-CoV-2/FLU/RSV plus assay is intended as an aid in the diagnosis of influenza from Nasopharyngeal swab specimens and should not be used as a sole basis for treatment. Nasal washings and aspirates are unacceptable for Xpert Xpress SARS-CoV-2/FLU/RSV testing.  Fact Sheet for Patients: EntrepreneurPulse.com.au  Fact Sheet for Healthcare Providers: IncredibleEmployment.be  This test is not yet approved or cleared by the [place] FDA and has been authorized for detection and/or diagnosis of SARS-CoV-2 by FDA under an Emergency Use Authorization (EUA). This EUA will remain in effect (meaning this test can be used) for the duration of  the COVID-19 declaration under Section 564(b)(1) of the Act, 21 U.S.C. section 360bbb-3(b)(1), unless the authorization is terminated or revoked.  Performed at [facility], [address] [place], [place] [address]   Blood Culture (routine x 2)     Status: None (Preliminary result)   Collection Time: 11/18/21 12:37 AM   Specimen: BLOOD  Result Value Ref Range Status   Specimen Description BLOOD RIGHT ANTECUBITAL  Final   Special Requests   Final    BOTTLES DRAWN AEROBIC AND ANAEROBIC Blood Culture adequate volume   Culture   Final    NO GROWTH 1 DAY Performed at [facility], [address] [place], [place] [address]    Report Status PENDING  Incomplete  Blood Culture (routine x 2)     Status: None (Preliminary result)   Collection Time: 11/18/21 12:37 AM   Specimen: BLOOD  Result Value Ref Range Status   Specimen Description BLOOD LEFT ANTECUBITAL  Final   Special Requests   Final    BOTTLES DRAWN AEROBIC AND ANAEROBIC Blood Culture adequate volume   Culture   Final    NO GROWTH 1 DAY Performed at [facility], [address] [place], [place] [address]    Report Status PENDING  Incomplete  Urine Culture     Status: Abnormal   Collection Time: 11/18/21  8:25 AM   Specimen: In/Out Cath Urine  Result Value Ref Range Status   Specimen Description   Final    IN/OUT CATH URINE Performed at [facility], [address], [place], [place] [address]    Special Requests   Final    NONE Performed at [facility], [address] [place], [place] [address]    Culture MULTIPLE SPECIES PRESENT, SUGGEST RECOLLECTION (A)  Final   Report Status 11/19/2021 FINAL  Final     Total time spend on discharging this patient, including the last patient exam, discussing the hospital stay, instructions for ongoing care as it relates to all pertinent caregivers, as well as preparing the medical discharge records, prescriptions, and/or  referrals as applicable, is 50 minutes.    Enzo Bi, MD  [facility] 11/19/2021, 1:33 PM

## 2021-11-19 NOTE — Progress Notes (Signed)
PHARMACIST - PHYSICIAN COMMUNICATION  CONCERNING:  Enoxaparin (Lovenox) for DVT Prophylaxis    RECOMMENDATION: Patient was prescribed enoxaparin 30mg  q24 hours for VTE prophylaxis.   Filed Weights   11/18/21 0033 11/19/21 0500  Weight: 61.1 kg (134 lb 11.2 oz) 55.8 kg (123 lb 0.3 oz)    Body mass index is 21.79 kg/m.  Estimated Creatinine Clearance: 31.9 mL/min (by C-G formula based on SCr of 0.97 mg/dL).   Patient is candidate for enoxaparin 40mg  every 24 hours based on CrCl >66ml/min and Weight >45kg  DESCRIPTION: Pharmacy has adjusted enoxaparin dose per [facility] policy.  Patient is now receiving enoxaparin 40 mg every 24 hours   [name] 11/19/2021 9:27 AM

## 2021-11-19 NOTE — Evaluation (Addendum)
Physical Therapy Evaluation Patient Details Name: [name] Day MRN: [id number] DOB: [birth year] Today's Date: 11/19/2021  History of Present Illness  pt is a 88 y/o F admitted on 11/18/2021 with c/c of acute respiratory distress that started shortly after and episode of spitting up and vomiting after patient complained of vertigo and nausea that worsened with lying down and progressed to vomiting. Pt is being treated for acute respiratory failure with hypoxia, sepsis secondary to aspiration pneumonia/pneumonitis, chronic combined systoilc and diastolic CHF, elevated troponin (supsect demand ischemia), chronic anemia, hypertension. PMH: HTN, DM, CVA (R-sided weakness), gastrostomy 2/2 esophageal stricture, combined CHF, CKD 3A, aortic stenosis.   Clinical Impression  4L/min O2 utilized throughout treatment. Patient reclining in bed with daughter [name] at bedside. Patient appears alert and oriented x 4 but daughter also contributes to history. Patient lives at home where her daughters take turns providing 24/7 care. Denies any falls in the last 6 months. Ambulates with rollator and needs help with IADLs and bathing at baseline. Patient and daughter report that patient gets dizziness that is a spinning sensation that has occurred throughout her life and seems worst in the evening. An episode of dizziness lead to her current hospitalization. She complained of feeling dizzy while sitting in the chair and requested assistance to go to bed. Her symptoms became worse when she lay down and she vomited which apparent lead to aspiration. Spinning sensation that worsens with positional changes of the head is consistent with possible BPPV. However, attempts to screen for it was limited due to patient's lack of tolerance during Side Lying Maneuver to test the superior/anterior and posterior canals. Patient had strong reproduction of symptoms with sidelying both directions (more with sidelying to the left suggesting  left posterior and/or right superior canal) but no nystagmus was observed. Patient reported intolerable headache both directions and was able to maintain the position less than 30 seconds. She started crying out and requested the testing stop, so no attempt was made to assess the horizontal canal. Bedside testing for BPPV inconclusive due to inability to tolerate bedside tests. Recommend further testing at ENT if further investigation is desired. Patient demo modified I for bed mobility, need for supervision for transfers, and CGA for ambulation ~ 150 feet with RW. Daughter stated her mobility is near her lowest level of recent mobility. Patient would benefit from HHPT to improve her functional mobility and safety. Patient would benefit from skilled physical therapy to address impairments and functional limitations (see PT Problem List below) to work towards stated goals and return to PLOF or maximal functional independence.       Recommendations for follow up therapy are one component of a multi-disciplinary discharge planning process, led by the attending physician.  Recommendations may be updated based on patient status, additional functional criteria and insurance authorization.  Follow Up Recommendations Home health PT    Assistance Recommended at Discharge Frequent or constant Supervision/Assistance  Patient can return home with the following  A little help with walking and/or transfers;A little help with bathing/dressing/bathroom;Assistance with cooking/housework;Assist for transportation;Help with stairs or ramp for entrance    Equipment Recommendations None recommended by PT  Recommendations for Other Services       Functional Status Assessment Patient has had a recent decline in their functional status and demonstrates the ability to make significant improvements in function in a reasonable and predictable amount of time.     Precautions / Restrictions Precautions Precautions:  Fall Restrictions Weight Bearing Restrictions: No  Mobility  Bed Mobility Overal bed mobility: Modified Independent             General bed mobility comments: increased time for supine <> Sit    Transfers Overall transfer level: Needs assistance Equipment used: Rolling walker (2 wheels) Transfers: Sit to/from Stand Sit to Stand: Supervision           General transfer comment: sit <> stand with increased time and cuing for hand placement.    Ambulation/Gait Ambulation/Gait assistance: Min guard Gait Distance (Feet): 150 Feet Assistive device: Rolling walker (2 wheels) Gait Pattern/deviations: Trunk flexed Gait velocity: reduced     General Gait Details: patient ambulated around B pod nursing station with RW and CGA for safety. increased trunk flexion and dependent on RW for balance.  Stairs            Wheelchair Mobility    Modified Rankin (Stroke Patients Only)       Balance Overall balance assessment: Needs assistance   Sitting balance-Leahy Scale: Good Sitting balance - Comments: able to don slip on shoes with increased time   Standing balance support: During functional activity, Reliant on assistive device for balance Standing balance-Leahy Scale: Fair Standing balance comment: able to take hands of RW breifly but otherwise dependent on RW for UE support for balance during walking.                             Pertinent Vitals/Pain Pain Assessment Pain Assessment: Faces Faces Pain Scale: Hurts whole lot Pain Location: headache with Side Lying Maneuver to assess BPPV Pain Descriptors / Indicators: Guarding, Grimacing, Headache Pain Intervention(s): Limited activity within patient's tolerance, Monitored during session, Repositioned    Home Living Family/patient expects to be discharged to:: Private residence Living Arrangements: Children (daughters take turns staying with her) Available Help at Discharge: Family;Available 24  hours/day Type of Home: House Home Access: Ramped entrance       Home Layout: One level Home Equipment: Conservation officer, nature (2 wheels);Rollator (4 wheels);Toilet riser;Cane - single point Additional Comments: Now receives 24/7 supervision from her daughters.    Prior Function Prior Level of Function : Needs assist             Mobility Comments: Patient ambulated mod I with rollator. ADLs Comments: Patient has help with bathing and is independent with dressing. Family assists with IADLs.     Hand Dominance   Dominant Hand: Right    Extremity/Trunk Assessment   Upper Extremity Assessment Upper Extremity Assessment: Generalized weakness;RUE deficits/detail RUE Deficits / Details: states her R UE is "paralyzed" but works well enough to use AD.    Lower Extremity Assessment Lower Extremity Assessment: Generalized weakness    Cervical / Trunk Assessment Cervical / Trunk Assessment: Kyphotic  Communication   Communication: HOH (mild)  Cognition Arousal/Alertness: Awake/alert Behavior During Therapy: WFL for tasks assessed/performed Overall Cognitive Status: Within Functional Limits for tasks assessed                                 General Comments: patient A&Ox4        General Comments General comments (skin integrity, edema, etc.): BPPV tests: Side Lying Maneuver: B positive for intolerable headache (to L > R), increased sympoms (vertigo, nausea, no heaving; to L > R), no nystagmus observed, able to tolerate > 30 seconds. Unable to continue exam due to poor tolerance.  Exercises Other Exercises Other Exercises: educated patient/family on role of PT in acute care setting, BPPV, discharge reccomendations.   Assessment/Plan    PT Assessment Patient needs continued PT services  PT Problem List Decreased strength;Pain;Decreased activity tolerance;Decreased balance;Decreased mobility       PT Treatment Interventions DME instruction;Balance training;Gait  training;Neuromuscular re-education;Stair training;Functional mobility training;Therapeutic activities;Therapeutic exercise;Patient/family education (vestibular)    PT Goals (Current goals can be found in the Care Plan section)  Acute Rehab PT Goals Patient Stated Goal: to get better PT Goal Formulation: With patient/family Time For Goal Achievement: 12/03/21 Potential to Achieve Goals: Good    Frequency Min 2X/week     Co-evaluation               AM-PAC PT "6 Clicks" Mobility  Outcome Measure Help needed turning from your back to your side while in a flat bed without using bedrails?: A Little Help needed moving from lying on your back to sitting on the side of a flat bed without using bedrails?: A Little Help needed moving to and from a bed to a chair (including a wheelchair)?: A Little Help needed standing up from a chair using your arms (e.g., wheelchair or bedside chair)?: A Little Help needed to walk in hospital room?: A Little Help needed climbing 3-5 steps with a railing? : A Little 6 Click Score: 18    End of Session Equipment Utilized During Treatment: Gait belt Activity Tolerance: Other (comment) (functional mobility tolerated well, unable to tolerate full BPPV exam due to increased headache and symptoms with Side Lying Maneuver.) Patient left: in bed;with family/visitor present;with call bell/phone within reach;with bed alarm set Nurse Communication: Mobility status PT Visit Diagnosis: Unsteadiness on feet (R26.81);Dizziness and giddiness (R42);Muscle weakness (generalized) (M62.81)    Time: 1010-1050 PT Time Calculation (min) (ACUTE ONLY): 40 min   Charges:   PT Evaluation $PT Eval Moderate Complexity: 1 Mod PT Treatments $Gait Training: 8-22 mins        Everlean Alstrom. Graylon Good, PT, DPT 11/19/21, 12:03 PM

## 2021-11-19 NOTE — Care Management CC44 (Signed)
Condition Code 44 Documentation Completed  Patient Details  Name: [name] Day MRN: [id number] Date of Birth: November 16, 1928   Condition Code 44 given:  Yes Patient signature on Condition Code 44 notice:  Yes Documentation of 2 MD's agreement:  Yes Code 44 added to claim:  Yes    Kieren Adkison E Brookelin Felber, LCSW 11/19/2021, 1:50 PM

## 2021-11-19 NOTE — TOC Initial Note (Signed)
Transition of Care [facility]) - Initial/Assessment Note    Patient Details  Name: [name] MRN: [id number] Date of Birth: 1929/08/05  [facility]) CM/SW Contact:    [name] Ivan, LCSW Phone Number: 11/19/2021, 2:18 PM  Clinical Narrative:               Patient to DC home today.  Spoke with daughter [name]. Patient lives alone but daughters [name] and [name] rotate providing 24/7 care.  PCP is Dr. Ginette [name]. Pharmacy is [facility] in [place] or [employer]. Daughters transport. Patient has a rollator, wheelchair, 3 wheeled walker, and RW. Patient used [facility] in the past and they want to use this agency again. CSW has reached out to [name] with [facility] via phone and message, no response.  Per previous conversation with [name], they were 2 weeks out for PT. Daughter [name] verbalized understanding and stated they still want to use [facility].  Referral sent to [name], asked to be notified if they cannot take patient. Informed her patient is DC today.      Barriers to Discharge: Barriers Resolved   Patient Goals and CMS Choice Patient states their goals for this hospitalization and ongoing recovery are:: home with home health CMS Medicare.gov Compare Post Acute Care list provided to:: Patient Represenative (must comment) Choice offered to / list presented to : Adult Children  Expected Discharge Plan and Services         Living arrangements for the past 2 months: Single Family Home Expected Discharge Date: 11/19/21                         HH Arranged: PT HH Agency: [facility] Date [place]: 11/19/21   Representative spoke with at [place]: [name]  Prior Living Arrangements/Services Living arrangements for the past 2 months: [place] with:: Adult Children Patient language and need for interpreter reviewed:: Yes Do you feel safe going back to the place where you live?: Yes      Need for  Family Participation in Patient Care: Yes (Comment) Care giver support system in place?: Yes (comment) Current home services: DME Criminal Activity/Legal Involvement Pertinent to Current Situation/Hospitalization: No - Comment as needed  Activities of Daily Living Home Assistive Devices/Equipment: Environmental consultant (specify type), Enteral Feeding Supplies ADL Screening (condition at time of admission) Patient's cognitive ability adequate to safely complete daily activities?: No Is the patient deaf or have difficulty hearing?: Yes Does the patient have difficulty seeing, even when wearing glasses/contacts?: Yes Does the patient have difficulty concentrating, remembering, or making decisions?: Yes Patient able to express need for assistance with ADLs?: No Does the patient have difficulty dressing or bathing?: Yes Independently performs ADLs?: No Communication: Independent Dressing (OT): Needs assistance Is this a change from baseline?: Pre-admission baseline Grooming: Needs assistance Is this a change from baseline?: Pre-admission baseline Feeding: Dependent Is this a change from baseline?: Pre-admission baseline Bathing: Needs assistance Is this a change from baseline?: Pre-admission baseline Toileting: Needs assistance Is this a change from baseline?: Pre-admission baseline In/Out Bed: Needs assistance Is this a change from baseline?: Pre-admission baseline Walks in Home: Independent with device (comment) Does the patient have difficulty walking or climbing stairs?: Yes Weakness of Legs: Both Weakness of Arms/Hands: Both  Permission Sought/Granted Permission sought to share information with : Facility Art therapist granted to share information with : Yes, Verbal Permission Granted (by daughter [name])  Permission granted to share info w AGENCY: HH and DME agencies        Emotional Assessment       Orientation: : Fluctuating Orientation (Suspected and/or  reported Sund[place]) Alcohol / Substance Use: Not Applicable Psych Involvement: No (comment)  Admission diagnosis:  Aspiration pneumonia ([place]) [J69.0] Acute respiratory failure with hypoxia (HCC) [J96.01] Aspiration pneumonia due to gastric secretions, unspecified laterality, unspecified part of lung ([place]) [J69.0] Sepsis with acute hypoxic respiratory failure without septic shock, due to unspecified organism ([place]) [A41.9, R65.20, J96.01] Patient Active Problem List   Diagnosis Date Noted   Aspiration pneumonia ([place]) 11/18/2021   Sepsis ([place]) 11/18/2021   Elevated troponin 09/21/2021   Stage 3a chronic kidney disease ([place]) 09/21/2021   Acute on chronic systolic CHF (congestive heart failure) (HCC)    Pain of toe of left foot    Acute exacerbation of CHF (congestive heart failure) ([place]) 08/07/2021   History of CVA (cerebrovascular accident) 07/26/2021   Chronic anemia 02/15/2021   HTN (hypertension) 02/15/2021   Stroke ([place]) 02/15/2021   Hyperkalemia 02/15/2021   AKI (acute kidney injury) ([place]) 02/15/2021   Acute on chronic combined systolic and diastolic CHF (congestive heart failure) ([place]) 02/15/2021   Abnormality of gait 01/19/2021   Malnutrition of moderate degree 12/29/2020   Pressure injury of skin 12/29/2020   Anemia of chronic disease    Essential hypertension    Controlled type 2 diabetes mellitus with hyperglycemia, without long-term current use of insulin (HCC)    Hypoalbuminemia due to protein-calorie malnutrition ([place])    Stroke (cerebrum) ([place]) 12/22/2020   Abdominal pain, lower    Cerebrovascular accident (CVA) due to occlusion of left middle cerebral artery ([place])    Right sided weakness    Weakness    Acute respiratory failure with hypoxia ([place]) 12/17/2020   Respiratory failure with hypoxia ([place]) 11/23/2019   CHF exacerbation ([place]) 11/23/2019   HFrEF (heart failure with reduced ejection fraction) ([place]) 11/23/2019   GERD (gastroesophageal reflux disease)     Gastrostomy tube dependent ([place])    Esophageal ulcer without bleeding    Type 2 diabetes mellitus without complication, without long-term current use of insulin (HCC)    Anemia    Aortic valve disease 08/24/2019   Gastrostomy tube in place ([place]) 07/31/2019   RLS (restless legs syndrome) 07/31/2019   Chest pain, atypical 03/18/2019   Tricuspid regurgitation 02/13/2019   Heart palpitations 02/12/2019   Hyponatremia 06/23/2018   Esophageal stricture 06/11/2018   Weight loss 05/29/2018   Dysphagia 05/29/2018   Esophageal stenosis 05/05/2018   Hyperlipemia 04/04/2018   Itching 01/08/2018   Medication monitoring encounter 01/08/2018   Paronychia of toe 01/08/2018   Esophagitis, CMV ([place]) 01/01/2018   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   Hypertension 04/30/2017   Mesenteric ischemia ([place]) 03/01/2017   Iron deficiency anemia 02/28/2017   PAD (peripheral artery disease) ([place]) 02/05/2017   Hyperlipidemia type II 04/04/2016   Pulmonary hypertension ([place]) 01/04/2015   Generalized OA 12/13/2014   Leg edema 12/13/2014   Aortic valve sclerosis 02/19/2014   Type 2 diabetes mellitus with hyperlipidemia ([place]) 12/25/2013   Acute, but ill-defined, cerebrovascular disease 12/25/2013   Unilateral primary osteoarthritis, unspecified knee 12/25/2013   Personal history of other malignant neoplasm of skin 05/28/2012   PCP:  [name], MD Pharmacy:   [facility] [place], [place] - [address] [facility] [address] [facility] [address] [place] [address] Phone: [phone] Fax: [place] [phone] -  [place], [place] [address] [place] [address] Phone: [phone] Fax: [phone]     Social Determinants of Health (SDOH) Interventions    Readmission Risk Interventions Readmission Risk Prevention Plan 11/19/2021 11/24/2019  Transportation Screening Complete Complete  PCP or  Specialist Appt within 3-5 Days - Complete  HRI or [place] - Complete  Medication Review ([place]) Complete Complete  PCP or Specialist appointment within 3-5 days of discharge Complete -  [place] or Home Care Consult Complete -  SW Recovery Care/Counseling Consult Complete -  Palliative Care Screening Not Applicable -  [place] Complete -  Some recent data might be hidden

## 2021-11-20 ENCOUNTER — Ambulatory Visit: Payer: Medicare Other | Admitting: Oncology

## 2021-11-20 ENCOUNTER — Other Ambulatory Visit: Payer: Medicare Other

## 2021-11-23 ENCOUNTER — Encounter
Admission: RE | Disposition: A | Discharge status: Home / Self Care | Payer: Self-pay | Source: Home / Self Care | Attending: Vascular Surgery

## 2021-11-23 ENCOUNTER — Other Ambulatory Visit: Payer: Self-pay

## 2021-11-23 ENCOUNTER — Encounter: Payer: Self-pay | Admitting: Vascular Surgery

## 2021-11-23 ENCOUNTER — Ambulatory Visit
Admission: RE | Admit: 2021-11-23 | Discharge: 2021-11-23 | Disposition: A | Discharge status: Home / Self Care | Payer: Medicare Other | Attending: Vascular Surgery | Admitting: Vascular Surgery

## 2021-11-23 ENCOUNTER — Other Ambulatory Visit (INDEPENDENT_AMBULATORY_CARE_PROVIDER_SITE_OTHER): Payer: Self-pay | Admitting: Nurse Practitioner

## 2021-11-23 DIAGNOSIS — L97909 Non-pressure chronic ulcer of unspecified part of unspecified lower leg with unspecified severity: Secondary | ICD-10-CM

## 2021-11-23 DIAGNOSIS — E11621 Type 2 diabetes mellitus with foot ulcer: Secondary | ICD-10-CM | POA: Diagnosis not present

## 2021-11-23 DIAGNOSIS — I110 Hypertensive heart disease with heart failure: Secondary | ICD-10-CM | POA: Insufficient documentation

## 2021-11-23 DIAGNOSIS — I70222 Atherosclerosis of native arteries of extremities with rest pain, left leg: Secondary | ICD-10-CM

## 2021-11-23 DIAGNOSIS — E785 Hyperlipidemia, unspecified: Secondary | ICD-10-CM | POA: Diagnosis not present

## 2021-11-23 DIAGNOSIS — I7092 Chronic total occlusion of artery of the extremities: Secondary | ICD-10-CM

## 2021-11-23 DIAGNOSIS — L97529 Non-pressure chronic ulcer of other part of left foot with unspecified severity: Secondary | ICD-10-CM | POA: Diagnosis not present

## 2021-11-23 DIAGNOSIS — Z7984 Long term (current) use of oral hypoglycemic drugs: Secondary | ICD-10-CM | POA: Diagnosis not present

## 2021-11-23 DIAGNOSIS — I70249 Atherosclerosis of native arteries of left leg with ulceration of unspecified site: Secondary | ICD-10-CM

## 2021-11-23 DIAGNOSIS — I70245 Atherosclerosis of native arteries of left leg with ulceration of other part of foot: Secondary | ICD-10-CM | POA: Insufficient documentation

## 2021-11-23 DIAGNOSIS — E1151 Type 2 diabetes mellitus with diabetic peripheral angiopathy without gangrene: Secondary | ICD-10-CM | POA: Diagnosis present

## 2021-11-23 DIAGNOSIS — I509 Heart failure, unspecified: Secondary | ICD-10-CM | POA: Insufficient documentation

## 2021-11-23 DIAGNOSIS — I70299 Other atherosclerosis of native arteries of extremities, unspecified extremity: Secondary | ICD-10-CM

## 2021-11-23 HISTORY — PX: LOWER EXTREMITY ANGIOGRAPHY: CATH118251

## 2021-11-23 LAB — CREATININE, SERUM
Creatinine, Ser: 1.04 mg/dL — ABNORMAL HIGH (ref 0.44–1.00)
GFR, Estimated: 51 mL/min — ABNORMAL LOW (ref >60)

## 2021-11-23 LAB — CULTURE, BLOOD (ROUTINE X 2)
Culture: NO GROWTH
Culture: NO GROWTH
Special Requests: ADEQUATE
Special Requests: ADEQUATE

## 2021-11-23 LAB — BUN: BUN: 52 mg/dL — ABNORMAL HIGH (ref 8–23)

## 2021-11-23 LAB — GLUCOSE, CAPILLARY: Glucose-Capillary: 160 mg/dL — ABNORMAL HIGH (ref 70–99)

## 2021-11-23 SURGERY — LOWER EXTREMITY ANGIOGRAPHY
Anesthesia: Moderate Sedation | Site: Leg Lower | Laterality: Left

## 2021-11-23 MED ORDER — DIPHENHYDRAMINE HCL 50 MG/ML IJ SOLN: 50.0000 mg | Freq: Once | INTRAMUSCULAR | Status: DC | PRN

## 2021-11-23 MED ORDER — SODIUM CHLORIDE 0.9 % IV SOLN: INTRAVENOUS | Status: DC

## 2021-11-23 MED ORDER — MIDAZOLAM HCL 2 MG/ML PO SYRP: 8.0000 mg | ORAL_SOLUTION | Freq: Once | ORAL | Status: DC | PRN

## 2021-11-23 MED ORDER — METHYLPREDNISOLONE SODIUM SUCC 125 MG IJ SOLR: 125.0000 mg | Freq: Once | INTRAMUSCULAR | Status: DC | PRN

## 2021-11-23 MED ORDER — FAMOTIDINE 20 MG PO TABS: 40.0000 mg | ORAL_TABLET | Freq: Once | ORAL | Status: DC | PRN

## 2021-11-23 MED ORDER — HEPARIN SODIUM (PORCINE) 1000 UNIT/ML IJ SOLN
INTRAMUSCULAR | Status: DC | PRN
  Administered 2021-11-23: 4000 [IU] via INTRAVENOUS

## 2021-11-23 MED ORDER — CEFAZOLIN SODIUM-DEXTROSE 2-4 GM/100ML-% IV SOLN: 2.0000 g | Freq: Once | INTRAVENOUS | Status: AC

## 2021-11-23 MED ORDER — SODIUM CHLORIDE 0.9% FLUSH: 3.0000 mL | INTRAVENOUS | Status: DC | PRN

## 2021-11-23 MED ORDER — SODIUM CHLORIDE 0.9 % IV SOLN: 250.0000 mL | INTRAVENOUS | Status: DC | PRN

## 2021-11-23 MED ORDER — MIDAZOLAM HCL 2 MG/2ML IJ SOLN
INTRAMUSCULAR | Status: AC
  Filled 2021-11-23: qty 2

## 2021-11-23 MED ORDER — FENTANYL CITRATE (PF) 100 MCG/2ML IJ SOLN
INTRAMUSCULAR | Status: DC | PRN
Start: 2021-11-23 — End: 2021-11-23
  Administered 2021-11-23: 50 ug via INTRAVENOUS

## 2021-11-23 MED ORDER — MIDAZOLAM HCL 2 MG/2ML IJ SOLN
INTRAMUSCULAR | Status: DC | PRN
  Administered 2021-11-23 (×2): 1 mg via INTRAVENOUS

## 2021-11-23 MED ORDER — HEPARIN SODIUM (PORCINE) 1000 UNIT/ML IJ SOLN
INTRAMUSCULAR | Status: AC
  Filled 2021-11-23: qty 10

## 2021-11-23 MED ORDER — LABETALOL HCL 5 MG/ML IV SOLN: 10.0000 mg | INTRAVENOUS | Status: DC | PRN

## 2021-11-23 MED ORDER — HYDRALAZINE HCL 20 MG/ML IJ SOLN: 5.0000 mg | INTRAMUSCULAR | Status: DC | PRN

## 2021-11-23 MED ORDER — ONDANSETRON HCL 4 MG/2ML IJ SOLN
4.0000 mg | Freq: Four times a day (QID) | INTRAMUSCULAR | Status: DC | PRN
Start: 2021-11-23 — End: 2021-11-23

## 2021-11-23 MED ORDER — HYDROMORPHONE HCL 1 MG/ML IJ SOLN: 1.0000 mg | Freq: Once | INTRAMUSCULAR | Status: DC | PRN

## 2021-11-23 MED ORDER — ACETAMINOPHEN 325 MG PO TABS: 650.0000 mg | ORAL_TABLET | ORAL | Status: DC | PRN

## 2021-11-23 MED ORDER — CEFAZOLIN SODIUM-DEXTROSE 2-4 GM/100ML-% IV SOLN
INTRAVENOUS | Status: AC
  Administered 2021-11-23: 2 g via INTRAVENOUS
  Filled 2021-11-23: qty 100

## 2021-11-23 MED ORDER — ONDANSETRON HCL 4 MG/2ML IJ SOLN: 4.0000 mg | Freq: Four times a day (QID) | INTRAMUSCULAR | Status: DC | PRN

## 2021-11-23 MED ORDER — IODIXANOL 320 MG/ML IV SOLN
INTRAVENOUS | Status: DC | PRN
Start: 2021-11-23 — End: 2021-11-23
  Administered 2021-11-23: 45 mL via INTRA_ARTERIAL

## 2021-11-23 MED ORDER — FENTANYL CITRATE PF 50 MCG/ML IJ SOSY
PREFILLED_SYRINGE | INTRAMUSCULAR | Status: AC
  Filled 2021-11-23: qty 1

## 2021-11-23 MED ORDER — SODIUM CHLORIDE 0.9% FLUSH: 3.0000 mL | Freq: Two times a day (BID) | INTRAVENOUS | Status: DC

## 2021-11-23 SURGICAL SUPPLY — 17 items
BALLN ULTRVRSE 2.5X300X150 (BALLOONS) ×2
BALLOON ULTRVRSE 2.5X300X150 (BALLOONS) IMPLANT
CATH ANGIO 5F PIGTAIL 65CM (CATHETERS) ×1 IMPLANT
CATH NAVICROSS ANGLED 135CM (MICROCATHETER) ×1 IMPLANT
CATH VERT 5X100 (CATHETERS) ×1 IMPLANT
COVER PROBE U/S 5X48 (MISCELLANEOUS) ×1 IMPLANT
DEVICE STARCLOSE SE CLOSURE (Vascular Products) ×1 IMPLANT
GLIDEWIRE ADV .035X260CM (WIRE) ×1 IMPLANT
GUIDEWIRE PFTE-COATED .018X300 (WIRE) ×1 IMPLANT
KIT ENCORE 26 ADVANTAGE (KITS) ×1 IMPLANT
PACK ANGIOGRAPHY (CUSTOM PROCEDURE TRAY) ×2 IMPLANT
SHEATH BRITE TIP 5FRX11 (SHEATH) ×1 IMPLANT
SHEATH RAABE 6FRX70 (SHEATH) ×1 IMPLANT
SYR MEDRAD MARK 7 150ML (SYRINGE) ×1 IMPLANT
TUBING CONTRAST HIGH PRESS 72 (TUBING) ×1 IMPLANT
WIRE G V18X300CM (WIRE) ×1 IMPLANT
WIRE GUIDERIGHT .035X150 (WIRE) ×1 IMPLANT

## 2021-11-23 NOTE — Interval H&P Note (Signed)
History and Physical Interval Note:  11/23/2021 9:31 AM  [name]  has presented today for surgery, with the diagnosis of LLE Angio  BARD   ASO w ulceration.  The various methods of treatment have been discussed with the patient and family. After consideration of risks, benefits and other options for treatment, the patient has consented to  Procedure(s): Lower Extremity Angiography (Left) as a surgical intervention.  The patient's history has been reviewed, patient examined, no change in status, stable for surgery.  I have reviewed the patient's chart and labs.  Questions were answered to the patient's satisfaction.     [name]

## 2021-11-23 NOTE — Op Note (Signed)
[facility]  Percutaneous Study/Intervention Procedural Note   Date of Surgery: 11/23/2021  Surgeon(s):Amoni Morales    Assistants:none  Pre-operative Diagnosis: PAD with ulceration LLE  Post-operative diagnosis:  Same  Procedure(s) Performed:             1.  Ultrasound guidance for vascular access right femoral artery             2.  Catheter placement into left common femoral artery from right femoral approach             3.  Aortogram and selective left lower extremity angiogram             4.  Percutaneous transluminal angioplasty of the left peroneal artery and tibioperoneal trunk with 2.5 mm diameter by 30 cm length angioplasty balloon             5.  StarClose closure device right femoral artery  EBL: 5 cc  Contrast: 45 cc  Fluoro Time: 6.8 minutes  Moderate Conscious Sedation Time: approximately 44 minutes using 2 mg of Versed and 50 mcg of Fentanyl              Indications:  Patient is a 88 y.o.female with known significant peripheral arterial disease and a nonhealing ulceration on the left foot which is actually worsening. The patient is brought in for angiography for further evaluation and potential treatment.  Due to the limb threatening nature of the situation, angiogram was performed for attempted limb salvage. The patient is aware that if the procedure fails, amputation would be expected.  The patient also understands that even with successful revascularization, amputation may still be required due to the severity of the situation.  Risks and benefits are discussed and informed consent is obtained.   Procedure:  The patient was identified and appropriate procedural time out was performed.  The patient was then placed supine on the table and prepped and draped in the usual sterile fashion. Moderate conscious sedation was administered during a face to face encounter with the patient throughout the procedure with my supervision of the RN administering  medicines and monitoring the patient's vital signs, pulse oximetry, telemetry and mental status throughout from the start of the procedure until the patient was taken to the recovery room. Ultrasound was used to evaluate the right common femoral artery.  It was patent .  A digital ultrasound image was acquired.  A Seldinger needle was used to access the right common femoral artery under direct ultrasound guidance and a permanent image was performed.  A 0.035 J wire was advanced without resistance and a 5Fr sheath was placed.  Pigtail catheter was placed into the aorta and an AP aortogram was performed. This demonstrated normal renal arteries and normal aorta and iliac segments without significant stenosis. I then crossed the aortic bifurcation and advanced to the left femoral head. Selective left lower extremity angiogram was then performed. This demonstrated fairly normal common femoral artery, profunda femoris artery, superficial femoral artery and mild disease in the popliteal artery which was not flow-limiting.  There was then severe tibial level disease.  The only distal runoff was the peroneal artery which had multiple areas of high-grade stenosis or short segment occlusion in the proximal and mid segments.  The anterior tibial artery was occluded after an short stump with a very poor distal reconstitution of a diseased vessel in the foot.  The posterior tibial artery was chronically occluded with no distal reconstitution.. It was felt that  it was in the patient's best interest to proceed with intervention after these images to avoid a second procedure and a larger amount of contrast and fluoroscopy based off of the findings from the initial angiogram. The patient was systemically heparinized and a 6 French 70 cm sheath was then placed over the Terumo Advantage wire. I then used a Kumpe catheter and the advantage wire to get down into the tibioperoneal trunk and then exchanged for first a V18 wire and then a  0.018 advantage wire to cross the occlusions and stenosis throughout the peroneal artery parking the wire in a distal peroneal branch at the ankle.  I then proceeded with treatment.  The diagnostic catheter was removed and a 2.5 mm diameter by 30 cm length angioplasty balloon was inflated from the distal peroneal artery up through the tibioperoneal trunk and taken up to 8 atm for 1 minute.  Completion imaging showed a marked improvement with brisk flow through the peroneal artery and less than 20% residual stenosis. I elected to terminate the procedure. The sheath was removed and StarClose closure device was deployed in the right femoral artery with excellent hemostatic result. The patient was taken to the recovery room in stable condition having tolerated the procedure well.  Findings:               Aortogram:  This demonstrated normal renal arteries and normal aorta and iliac segments without significant stenosis.             Left lower Extremity: Fairly normal common femoral artery, profunda femoris artery, superficial femoral artery and mild disease in the popliteal artery which was not flow-limiting.  There was then severe tibial level disease.  The only distal runoff was the peroneal artery which had multiple areas of high-grade stenosis or short segment occlusion in the proximal and mid segments.  The anterior tibial artery was occluded after an short stump with a very poor distal reconstitution of a diseased vessel in the foot.  The posterior tibial artery was chronically occluded with no distal reconstitution.   Disposition: Patient was taken to the recovery room in stable condition having tolerated the procedure well.  Complications: None  [name] 11/23/2021 12:09 PM   This note was created with Dragon Medical transcription system. Any errors in dictation are purely unintentional.

## 2021-11-23 NOTE — Progress Notes (Signed)
Dr. Lucky Cowboy at bedside, speaking with pt. And her daughter [name] . MD reviewed procedural results with both.

## 2021-11-24 ENCOUNTER — Telehealth (INDEPENDENT_AMBULATORY_CARE_PROVIDER_SITE_OTHER): Payer: Self-pay

## 2021-11-24 NOTE — Telephone Encounter (Signed)
The  pt daughter called and left a VM on the nurse line wanting to know would it be ok if she gave her mom IB profen as well as the tylenol  she is giving her due to the increased pain she is having since her LE angio yesterday. Please advise.

## 2021-11-24 NOTE — Telephone Encounter (Signed)
I would not advise ibuprofen due to the increased bleeding risk but Tylenol is okay.  We can try giving her mom a little bit of tramadol for pain relief if she would like.  I can send that into her pharmacy.

## 2021-11-24 NOTE — Telephone Encounter (Signed)
I spoke with the pt's daughter an she wants to try the tramadol send to [facility] in [place].

## 2021-11-26 ENCOUNTER — Emergency Department: Payer: Medicare Other

## 2021-11-26 ENCOUNTER — Emergency Department
Admission: EM | Admit: 2021-11-26 | Discharge: 2021-11-27 | Disposition: A | Discharge status: Home / Self Care | Payer: Medicare Other | Attending: Emergency Medicine | Admitting: Emergency Medicine

## 2021-11-26 ENCOUNTER — Other Ambulatory Visit: Payer: Self-pay

## 2021-11-26 DIAGNOSIS — R079 Chest pain, unspecified: Secondary | ICD-10-CM | POA: Diagnosis not present

## 2021-11-26 DIAGNOSIS — R0602 Shortness of breath: Secondary | ICD-10-CM | POA: Diagnosis present

## 2021-11-26 LAB — CBC
HCT: 34.5 % — ABNORMAL LOW (ref 36.0–46.0)
Hemoglobin: 10.6 g/dL — ABNORMAL LOW (ref 12.0–15.0)
MCH: 27.6 pg (ref 26.0–34.0)
MCHC: 30.7 g/dL (ref 30.0–36.0)
MCV: 89.8 fL (ref 80.0–100.0)
Platelets: 293 10*3/uL (ref 150–400)
RBC: 3.84 MIL/uL — ABNORMAL LOW (ref 3.87–5.11)
RDW: 17.9 % — ABNORMAL HIGH (ref 11.5–15.5)
WBC: 16.7 10*3/uL — ABNORMAL HIGH (ref 4.0–10.5)
nRBC: 0 % (ref 0.0–0.2)

## 2021-11-26 LAB — TROPONIN I (HIGH SENSITIVITY): Troponin I (High Sensitivity): 40 ng/L — ABNORMAL HIGH (ref <18)

## 2021-11-26 MED ORDER — IOHEXOL 350 MG/ML SOLN
75.0000 mL | Freq: Once | INTRAVENOUS | Status: AC | PRN
  Administered 2021-11-26: 75 mL via INTRAVENOUS

## 2021-11-26 NOTE — ED Triage Notes (Signed)
Pt reports shob and chest pain since last pm. Pt states has been coughing. Daughter states pt was here last week for aspiration.

## 2021-11-26 NOTE — ED Notes (Signed)
Pt taken to CT at this time.

## 2021-11-26 NOTE — ED Provider Notes (Signed)
[facility] Provider Note    Event Date/Time   First MD Initiated Contact with Patient 11/26/21 2158     (approximate)   History   Shortness of Breath   HPI  [name] is a 88 y.o. female  who, per discharge summary dated one week ago had admission to the hospital secondary to acute respiratory failure secondary to aspiration pneumonitis, who presents to the emergency department today because of concern for an episode of chest pain and shortness of breath.  The pain was located in her left chest.  By the time my exam it was gone.  She denies any associated cough or fever.  Patient did have recent admission to the hospital secondary to aspiration pneumonitis and daughter states patient recently finished a course of antibiotics for UTI yesterday.  Furthermore the patient does have left third toe pain and is seeing both podiatry and vascular surgery for this.  Physical Exam   Triage Vital Signs: ED Triage Vitals [11/26/21 2109]  Enc Vitals Group     BP 117/68     Pulse Rate 76     Resp 20     Temp 97.8 F (36.6 C)     Temp Source Oral     SpO2 94 %     Weight 109 lb (49.4 kg)     Height 5\' 3"  (1.6 m)     Head Circumference      Peak Flow      Pain Score 5   Most recent vital signs: Vitals:   11/26/21 2109 11/26/21 2153  BP: 117/68 (!) 131/59  Pulse: 76 81  Resp: 20 (!) 24  Temp: 97.8 F (36.6 C)   SpO2: 94% 98%    General: Awake, no distress.  CV:  Good peripheral perfusion. Regular rate Resp:  Normal effort. Lungs clear to auscultation Abd:  No distention.    ED Results / Procedures / Treatments   Labs (all labs ordered are listed, but only abnormal results are displayed) Labs Reviewed  CBC - Abnormal; Notable for the following components:      Result Value   WBC 16.7 (*)    RBC 3.84 (*)    Hemoglobin 10.6 (*)    HCT 34.5 (*)    RDW 17.9 (*)    All other components within normal limits  COMPREHENSIVE METABOLIC PANEL -  Abnormal; Notable for the following components:   Glucose, Bld 318 (*)    BUN 45 (*)    Creatinine, Ser 1.06 (*)    Calcium 8.6 (*)    Total Protein 6.3 (*)    Albumin 3.2 (*)    GFR, Estimated 50 (*)    All other components within normal limits  TROPONIN I (HIGH SENSITIVITY) - Abnormal; Notable for the following components:   Troponin I (High Sensitivity) 40 (*)    All other components within normal limits     EKG  I, Nance Pear, attending physician, personally viewed and interpreted this EKG  EKG Time: 2111 Rate: 80 Rhythm: normal sinus rhythm Axis: normal Intervals: qtc 498 QRS: narrow, LVH ST changes: no st elevation Impression: abnormal ekg    RADIOLOGY CXR I independently interpreted and visualized the CXR. My interpretation: Question hazy opacity in right lung. No pneumothorax. Radiology interpretation:  IMPRESSION:  Cardiomegaly, vascular congestion.     Small bilateral effusions.  Left base atelectasis    PROCEDURES:  Critical Care performed: No  Procedures   MEDICATIONS ORDERED IN  ED: Medications - No data to display   IMPRESSION / MDM / Cobb / ED COURSE  I reviewed the triage vital signs and the nursing notes.                              Differential diagnosis includes, but is not limited to, ACS, pneumonia, pneumothorax, costochondritis.  Patient presented to the emergency department today because of concerns for chest pain and shortness of breath.  At the time my exam the patient no longer has any symptoms.  Blood work does show a slight leukocytosis.  This is of somewhat unclear etiology.  I did check a CT angio to evaluate for possible PE or pneumonia that was missed on the chest x-ray.  This did not show any obvious infection and no PE.  Patient did just finish a course of antibiotics for UTI so I have lower concern that that could be the cause.  She does have a left third toe issues and an x-ray was obtained to evaluate  for possible osteomyelitis although this did not show any acute osseous injury.  Initial troponin was elevated at 40 although patient has baseline elevation.  Will check a second troponin.  Patient was still blood cultures however I do think if second troponin is patient's baseline and without significant change and patient has no more episodes of shortness of breath or chest pain it would be reasonable for patient be discharged home.   FINAL CLINICAL IMPRESSION(S) / ED DIAGNOSES   Chest pain Shortness of breath    Note:  This document was prepared using Dragon voice recognition software and may include unintentional dictation errors.    Nance Pear, MD 11/26/21 [phone]

## 2021-11-26 NOTE — Progress Notes (Signed)
Subjective:    Patient ID: [name], female    DOB: 24-Dec-1928, 88 y.o.   MRN: [id number] Chief Complaint  Patient presents with   Follow-up    Ultrasound follow up    [name] is a 88 year old female that returns today for follow-up evaluation of lower extremity wounds.  The patient recently underwent angiogram on  09/18/2021 however the patient lost several toenails and has some open ulcerations.  Since her most recent follow-up those have gotten worse and there is also some further involvement on additional toes.  This is very painful for the patient.  She denies any fevers or chills.  Today noninvasive studies show biphasic waveforms throughout the left lower extremity with monophasic waveforms in the tibial arteries.   Review of Systems  Cardiovascular:  Positive for leg swelling.  Skin:  Positive for wound.  All other systems reviewed and are negative.     Objective:   Physical Exam Vitals reviewed.  HENT:     Head: Normocephalic.  Cardiovascular:     Rate and Rhythm: Normal rate.     Pulses:          Dorsalis pedis pulses are detected w/ Doppler on the right side and detected w/ Doppler on the left side.       Posterior tibial pulses are detected w/ Doppler on the right side and detected w/ Doppler on the left side.  Pulmonary:     Effort: Pulmonary effort is normal.  Neurological:     Mental Status: She is alert and oriented to person, place, and time.  Psychiatric:        Mood and Affect: Mood normal.        Behavior: Behavior normal.        Thought Content: Thought content normal.        Judgment: Judgment normal.    BP 130/67 (BP Location: Right Arm)    Pulse 81    Resp 16    Wt 114 lb 3.2 oz (51.8 kg)    BMI 19.60 kg/m   Past Medical History:  Diagnosis Date   Anemia    IRON INFUSIONS   Aortic valve sclerosis    Arrhythmia    Arthritis    osteoarthritis   Basal cell carcinoma of skin    Brain tumor (HCC)    Brain tumor (HCC)     Cervical spine disease    CHF (congestive heart failure) (HCC)    Diabetes mellitus without complication (HCC)    Dysrhythmia    sinus arrhythmia   Esophageal stricture    severe, led to feeding tube placement in Sept 2019   Esophageal ulcer without bleeding    Gastrostomy tube dependent (HCC)    DOES NOT EAT OR DRINK    GERD (gastroesophageal reflux disease)    Hearing loss    pt had hearing test  last month, left ear30% hearing due to stroke, 78% in right-normal for her age   History of kidney stones    Hyperlipemia    Hypertension    Kidney stones    Leaky heart valve    Meningioma (HCC)    Occlusive mesenteric ischemia (HCC)    Pulmonary hypertension (HCC)    PVD (peripheral vascular disease) (HCC) 2023   RLS (restless legs syndrome)    Stroke (HCC)    Vertigo     Social History   Socioeconomic History   Marital status: Widowed    Spouse name: Not on  file   Number of children: 3   Years of education: Not on file   Highest education level: Not on file  Occupational History   Not on file  Tobacco Use   Smoking status: Never   Smokeless tobacco: Never  Vaping Use   Vaping Use: Never used  Substance and Sexual Activity   Alcohol use: Never   Drug use: Never   Sexual activity: Not Currently  Other Topics Concern   Not on file  Social History Narrative   Lives alone. Someone stays with pt. All the time (one of her 3 children)   Daughter helps her.    Social Determinants of Health   Financial Resource Strain: Not on file  Food Insecurity: Not on file  Transportation Needs: Not on file  Physical Activity: Not on file  Stress: Not on file  Social Connections: Not on file  Intimate Partner Violence: Not on file    Past Surgical History:  Procedure Laterality Date   ABDOMINAL HYSTERECTOMY     APPENDECTOMY     ARTHROGRAM KNEE Left    BACK SURGERY     CERVIVAL NECK FUSION   CATARACT EXTRACTION W/PHACO Left 11/11/2018   Procedure: CATARACT EXTRACTION PHACO  AND INTRAOCULAR LENS PLACEMENT ([place]) LEFT, DIABETIC;  Surgeon: Birder Robson, MD;  Location: [facility] ORS;  Service: Ophthalmology;  Laterality: Left;  [place]  00:41 CDE 6.41 Fluid pack lot # [id number]   COLONOSCOPY WITH PROPOFOL N/A 06/20/2017   Procedure: COLONOSCOPY WITH PROPOFOL;  Surgeon: Lollie Sails, MD;  Location: [facility] ENDOSCOPY;  Service: Endoscopy;  Laterality: N/A;   ESOPHAGOGASTRODUODENOSCOPY (EGD) WITH PROPOFOL N/A 03/14/2015   Procedure: ESOPHAGOGASTRODUODENOSCOPY (EGD) WITH PROPOFOL;  Surgeon: Josefine Class, MD;  Location: [facility] ENDOSCOPY;  Service: Endoscopy;  Laterality: N/A;   ESOPHAGOGASTRODUODENOSCOPY (EGD) WITH PROPOFOL N/A 03/28/2017   Procedure: ESOPHAGOGASTRODUODENOSCOPY (EGD) WITH PROPOFOL;  Surgeon: Lollie Sails, MD;  Location: [facility] ENDOSCOPY;  Service: Endoscopy;  Laterality: N/A;   ESOPHAGOGASTRODUODENOSCOPY (EGD) WITH PROPOFOL N/A 06/20/2017   Procedure: ESOPHAGOGASTRODUODENOSCOPY (EGD) WITH PROPOFOL;  Surgeon: Lollie Sails, MD;  Location: [facility] ENDOSCOPY;  Service: Endoscopy;  Laterality: N/A;   ESOPHAGOGASTRODUODENOSCOPY (EGD) WITH PROPOFOL N/A 10/21/2017   Procedure: ESOPHAGOGASTRODUODENOSCOPY (EGD) WITH PROPOFOL;  Surgeon: Lollie Sails, MD;  Location: [facility] ENDOSCOPY;  Service: Endoscopy;  Laterality: N/A;   ESOPHAGOGASTRODUODENOSCOPY (EGD) WITH PROPOFOL N/A 04/15/2018   Procedure: ESOPHAGOGASTRODUODENOSCOPY (EGD) WITH PROPOFOL;  Surgeon: Lollie Sails, MD;  Location: [facility] ENDOSCOPY;  Service: Endoscopy;  Laterality: N/A;   ESOPHAGUS SURGERY     CLOSURE   EYE SURGERY     HYSTERECTOMY ABDOMINAL WITH SALPINGECTOMY     IR GASTROSTOMY TUBE MOD SED  06/11/2018   IR GASTROSTOMY TUBE REMOVAL  03/25/2019   IR REPLACE G-TUBE SIMPLE WO FLUORO  10/19/2020   LOWER EXTREMITY ANGIOGRAPHY Left 09/18/2021   Procedure: LOWER EXTREMITY ANGIOGRAPHY;  Surgeon: Algernon Huxley, MD;  Location: [place] [facility];  Service: Cardiovascular;  Laterality: Left;   LOWER  EXTREMITY ANGIOGRAPHY Left 11/23/2021   Procedure: Lower Extremity Angiography;  Surgeon: Algernon Huxley, MD;  Location: [place] [facility];  Service: Cardiovascular;  Laterality: Left;   PERIPHERAL VASCULAR CATHETERIZATION N/A 01/23/2016   Procedure: Visceral Venography;  Surgeon: Algernon Huxley, MD;  Location: [place] [facility];  Service: Cardiovascular;  Laterality: N/A;   PERIPHERAL VASCULAR CATHETERIZATION  01/23/2016   Procedure: Peripheral Vascular Intervention;  Surgeon: Algernon Huxley, MD;  Location: [place] [facility];  Service: Cardiovascular;;  UPPER ESOPHAGEAL ENDOSCOPIC ULTRASOUND (EUS) N/A 12/05/2017   Procedure: UPPER ESOPHAGEAL ENDOSCOPIC ULTRASOUND (EUS);  Surgeon: Jola Schmidt, MD;  Location: [facility] ENDOSCOPY;  Service: Endoscopy;  Laterality: N/A;   VISCERAL ANGIOGRAPHY N/A 03/18/2017   Procedure: Visceral Angiography;  Surgeon: Algernon Huxley, MD;  Location: [place] [facility];  Service: Cardiovascular;  Laterality: N/A;   VISCERAL ARTERY INTERVENTION N/A 03/18/2017   Procedure: Visceral Artery Intervention;  Surgeon: Algernon Huxley, MD;  Location: [place] [facility];  Service: Cardiovascular;  Laterality: N/A;    Family History  Problem Relation Age of Onset   Stroke Mother    Hypertension Mother    Heart disease Mother    Cancer Father    Heart disease Father    Heart attack Sister    Diabetes Sister    Heart attack Brother    Thyroid cancer Daughter    Cancer - Colon Daughter     Allergies  Allergen Reactions   Elemental Sulfur Diarrhea and Nausea And Vomiting   Gabapentin Swelling   Lipitor [Atorvastatin] Other (See Comments)    Muscle aches   Mevacor [Lovastatin] Other (See Comments)    Muscle aches   Milk-Related Compounds Other (See Comments)    Large quantities cause headaches    Septra [Sulfamethoxazole-Trimethoprim] Other (See Comments)    Unknown   Statins Other (See Comments)    Muscle pain   Sulfa Antibiotics Other (See Comments)   Zocor  [Simvastatin] Other (See Comments)    Muscle aches    CBC Latest Ref Rng & Units 11/19/2021 11/18/2021 09/21/2021  WBC 4.0 - 10.5 K/uL 11.1(H) 17.3(H) 7.3  Hemoglobin 12.0 - 15.0 g/dL 11.0(L) 13.4 12.2  Hematocrit 36.0 - 46.0 % 35.6(L) 44.5 39.6  Platelets 150 - 400 K/uL 287 413(H) 269      CMP     Component Value Date/Time   NA 144 11/19/2021 0615   K 4.8 11/19/2021 0615   K 4.1 12/23/2014 1008   CL 105 11/19/2021 0615   CO2 31 11/19/2021 0615   GLUCOSE 187 (H) 11/19/2021 0615   BUN 52 (H) 11/23/2021 0952   CREATININE 1.04 (H) 11/23/2021 0952   CREATININE 0.71 01/30/2018 1633   CALCIUM 8.9 11/19/2021 0615   PROT 7.7 11/18/2021 0034   ALBUMIN 3.7 11/18/2021 0034   AST 55 (H) 11/18/2021 0034   ALT 36 11/18/2021 0034   ALKPHOS 53 11/18/2021 0034   BILITOT 0.5 11/18/2021 0034   GFRNONAA 51 (L) 11/23/2021 0952   GFRAA 58 (L) 02/04/2020 0915     VAS [place] ABI WITH/WO TBI  Result Date: 10/30/2021  LOWER EXTREMITY DOPPLER STUDY Patient Name:  [name]  Date of Exam:   10/25/2021 Medical Rec #: [id number]         Accession #:    [id number] Date of Birth: March 06, 1929        Patient Gender: F Patient Age:   62 years Exam Location:  [facility] Procedure:      VAS [place] ABI WITH/WO TBI Referring Phys: --------------------------------------------------------------------------------  Indications: Peripheral artery disease.  Vascular Interventions: 09/18/2021 Left PTA of SFA/POP and calf vessels. Comparison Study: 09/12/2021 Performing Technologist: Concha Norway RVT  Examination Guidelines: A complete evaluation includes at minimum, Doppler waveform signals and systolic blood pressure reading at the level of bilateral brachial, anterior tibial, and posterior tibial arteries, when vessel segments are accessible. Bilateral testing is considered an integral part of a complete examination. Photoelectric Plethysmograph (PPG) waveforms and toe  systolic pressure readings are included as  required and additional duplex testing as needed. Limited examinations for reoccurring indications may be performed as noted.  ABI Findings: +---------+------------------+-----+----------+--------+  Right     Rt Pressure (mmHg) Index Waveform   Comment   +---------+------------------+-----+----------+--------+  Brachial  130                                           +---------+------------------+-----+----------+--------+  ATA       138                1.06  monophasic           +---------+------------------+-----+----------+--------+  PTA       119                0.92  monophasic           +---------+------------------+-----+----------+--------+  Great Toe 73                 0.56  Abnormal             +---------+------------------+-----+----------+--------+ +---------+------------------+-----+----------+-------+  Left      Lt Pressure (mmHg) Index Waveform   Comment  +---------+------------------+-----+----------+-------+  ATA                                monophasic NonComp  +---------+------------------+-----+----------+-------+  PTA       173                1.33  biphasic            +---------+------------------+-----+----------+-------+  Great Toe 28                 0.22  Abnormal            +---------+------------------+-----+----------+-------+ +-------+-----------+-----------+------------+------------+  ABI/TBI Today's ABI Today's TBI Previous ABI Previous TBI  +-------+-----------+-----------+------------+------------+  Right   1.06        .56         [place]           .32           +-------+-----------+-----------+------------+------------+  Left    1.33        .22         1.06         .13           +-------+-----------+-----------+------------+------------+  Summary: Right: Resting right ankle-brachial index is within normal range. No evidence of significant right lower extremity arterial disease. The right toe-brachial index is abnormal. Compressible vessels and improved TBI compared to previous study. Left:  Resting left ankle-brachial index is within normal range. No evidence of significant left lower extremity arterial disease. The left toe-brachial index is abnormal. Increased flow and slightly improved TBI and digital waveforms in the left foot.  *See table(s) above for measurements and observations.  Electronically signed by [name] MD on 10/30/2021 at 10:22:43 AM.    Final        Assessment & Plan:   1. PAD (peripheral artery disease) (HCC)  Recommend:  The patient has evidence of severe atherosclerotic changes of both lower extremities associated with ulceration and tissue loss of the foot.  This represents a limb threatening ischemia and places the patient at the risk for limb loss.  Patient should undergo angiography of the lower extremities with the  hope for intervention for limb salvage.  The risks and benefits as well as the alternative therapies was discussed in detail with the patient.  All questions were answered.  Patient agrees to proceed with angiography.  The patient will follow up with me in the office after the procedure.    2. Type 2 diabetes mellitus with hyperlipidemia ([place]) Continue hypoglycemic medications as already ordered, these medications have been reviewed and there are no changes at this time.  Hgb A1C to be monitored as already arranged by primary service   3. Primary hypertension Continue antihypertensive medications as already ordered, these medications have been reviewed and there are no changes at this time.    Current Outpatient Medications on File Prior to Visit  Medication Sig Dispense Refill   acetaminophen (TYLENOL) 160 MG/5ML solution Place 20.3 mLs (650 mg total) into feeding tube every 6 (six) hours as needed for mild pain. 120 mL 0   aspirin 81 MG chewable tablet by Nasogastric route.     carvedilol (COREG) 3.125 MG tablet Place 1 tablet (3.125 mg total) into feeding tube 2 (two) times daily with a meal. 30 tablet 3   clopidogrel (PLAVIX) 75 MG  tablet GIVE 1 TABLET VIA FEEDING TUBE EVERY [name] AS DIRECTED 30 tablet 2   hydrocerin (EUCERIN) CREA Apply 1 application topically 2 (two) times daily. To bilateral feet  0   Hypromellose 0.2 % SOLN Place 1 drop into both eyes 3 (three) times daily as needed (dry eyes).     lisinopril (ZESTRIL) 2.5 MG tablet Take 1 tablet by mouth daily.     loperamide (IMODIUM) 1 MG/5ML solution Take 1 mg by mouth as needed for diarrhea or loose stools.     metFORMIN (GLUCOPHAGE) 500 MG tablet Place 1 tablet (500 mg total) into feeding tube 2 (two) times daily with a meal. 30 tablet 1   Multiple Vitamin (MULTIVITAMIN) LIQD Place 15 mLs into feeding tube daily. 450 mL 1   Nutritional Supplements (FEEDING SUPPLEMENT, KATE FARMS STANDARD 1.4,) LIQD liquid Place 325 mLs into feeding tube 4 (four) times daily. (Patient taking differently: Place 325 mLs into feeding tube 3 (three) times daily between meals.)     pantoprazole sodium (PROTONIX) 40 mg/20 mL PACK Place 20 mLs (40 mg total) into feeding tube daily. 600 mL 0   rOPINIRole (REQUIP) 2 MG tablet Place 1 tablet (2 mg total) into feeding tube 2 (two) times daily. 60 tablet 0   Water For Irrigation, Sterile (FREE WATER) SOLN Place 120 mLs into feeding tube 4 (four) times daily. (Patient taking differently: Place 160 mLs into feeding tube 3 (three) times daily.)     No current facility-administered medications on file prior to visit.    There are no Patient Instructions on file for this visit. No follow-ups on file.   Kris Hartmann, NP

## 2021-11-27 ENCOUNTER — Telehealth (INDEPENDENT_AMBULATORY_CARE_PROVIDER_SITE_OTHER): Payer: Self-pay

## 2021-11-27 ENCOUNTER — Other Ambulatory Visit (INDEPENDENT_AMBULATORY_CARE_PROVIDER_SITE_OTHER): Payer: Self-pay | Admitting: Nurse Practitioner

## 2021-11-27 ENCOUNTER — Other Ambulatory Visit (HOSPITAL_COMMUNITY): Payer: Self-pay

## 2021-11-27 LAB — TROPONIN I (HIGH SENSITIVITY): Troponin I (High Sensitivity): 39 ng/L — ABNORMAL HIGH (ref <18)

## 2021-11-27 LAB — URINALYSIS, COMPLETE (UACMP) WITH MICROSCOPIC
Bacteria, UA: NONE SEEN
Bilirubin Urine: NEGATIVE
Glucose, UA: 150 mg/dL — AB
Hgb urine dipstick: NEGATIVE
Ketones, ur: NEGATIVE mg/dL
Nitrite: NEGATIVE
Protein, ur: NEGATIVE mg/dL
Specific Gravity, Urine: 1.033 — ABNORMAL HIGH (ref 1.005–1.030)
Squamous Epithelial / HPF: NONE SEEN (ref 0–5)
pH: 5 (ref 5.0–8.0)

## 2021-11-27 MED ORDER — ACETAMINOPHEN 325 MG PO TABS
650.0000 mg | ORAL_TABLET | Freq: Once | ORAL | Status: AC
Start: 2021-11-27 — End: 2021-11-27
  Administered 2021-11-27: 650 mg via ORAL
  Filled 2021-11-27: qty 2

## 2021-11-27 MED ORDER — TRAMADOL HCL 50 MG PO TABS: 50.0000 mg | ORAL_TABLET | Freq: Four times a day (QID) | ORAL | 0 refills | Status: DC | PRN

## 2021-11-27 NOTE — ED Provider Notes (Signed)
Procedures     ----------------------------------------- 2:20 AM on 11/27/2021 ----------------------------------------- Delta troponin repeat troponin flat.  Urinalysis unremarkable.  No further symptoms, feeling well.  Not requiring admission.  Will discharge home per Dr. Gennette Pac plan.     Carrie Mew, MD 11/27/21 [phone]

## 2021-11-27 NOTE — Telephone Encounter (Signed)
Patient's daughter left a message about her Tramadol pain medication not being at [facility]. Tramadol has been called into the pharmacy and patient's daughter has been notified.

## 2021-11-27 NOTE — Progress Notes (Signed)
Today spoke with Marjean's daughter and she states she is doing ok but her toe is in a lot of pain.  She denies any more shortness of breath episodes today.  She has had several doctors this past month with procedures. She is waiting for her doctor to contact her about her tramadol prescription, they sent her one to [facility] in [place] but they do not have any medications  Contacted [facility] in [place] for them and they have some.  [facility] and they will send a new prescription to them.  Advised daughter to give them about 1 hour and call them to make sure it is ready.  Provided her with phone and address of [facility].  She was thankful and advised her to let me know if any problems.    [place] [phone]

## 2021-11-27 NOTE — Discharge Instructions (Signed)
Your tests in the emergency department today were all reassuring.  Please follow-up with your doctor this week for continued evaluation of your symptoms.

## 2021-11-28 ENCOUNTER — Other Ambulatory Visit: Payer: Self-pay | Admitting: *Deleted

## 2021-11-28 DIAGNOSIS — D509 Iron deficiency anemia, unspecified: Secondary | ICD-10-CM

## 2021-11-28 LAB — COMPREHENSIVE METABOLIC PANEL
ALT: 18 U/L (ref 0–44)
AST: 21 U/L (ref 15–41)
Albumin: 3.2 g/dL — ABNORMAL LOW (ref 3.5–5.0)
Alkaline Phosphatase: 56 U/L (ref 38–126)
Anion gap: 14 (ref 5–15)
BUN: 45 mg/dL — ABNORMAL HIGH (ref 8–23)
CO2: 26 mmol/L (ref 22–32)
Calcium: 8.6 mg/dL — ABNORMAL LOW (ref 8.9–10.3)
Chloride: 100 mmol/L (ref 98–111)
Creatinine, Ser: 1.06 mg/dL — ABNORMAL HIGH (ref 0.44–1.00)
GFR, Estimated: 50 mL/min — ABNORMAL LOW (ref >60)
Glucose, Bld: 368 mg/dL — ABNORMAL HIGH (ref 70–99)
Potassium: 4.3 mmol/L (ref 3.5–5.1)
Sodium: 140 mmol/L (ref 135–145)
Total Bilirubin: 0.5 mg/dL (ref 0.3–1.2)
Total Protein: 6.3 g/dL — ABNORMAL LOW (ref 6.5–8.1)

## 2021-12-02 LAB — CULTURE, BLOOD (ROUTINE X 2)
Culture: NO GROWTH
Culture: NO GROWTH
Special Requests: ADEQUATE
Special Requests: ADEQUATE

## 2021-12-04 ENCOUNTER — Inpatient Hospital Stay: Payer: Medicare Other

## 2021-12-04 ENCOUNTER — Telehealth: Payer: Self-pay | Admitting: *Deleted

## 2021-12-04 NOTE — Telephone Encounter (Signed)
Daughter needs to reschedule appointment that was just scheduled. ?

## 2021-12-05 ENCOUNTER — Inpatient Hospital Stay: Payer: Medicare Other

## 2021-12-05 ENCOUNTER — Other Ambulatory Visit: Payer: Self-pay

## 2021-12-05 ENCOUNTER — Other Ambulatory Visit (HOSPITAL_COMMUNITY): Payer: Self-pay

## 2021-12-05 ENCOUNTER — Inpatient Hospital Stay: Payer: Medicare Other | Attending: Nurse Practitioner

## 2021-12-05 ENCOUNTER — Other Ambulatory Visit (INDEPENDENT_AMBULATORY_CARE_PROVIDER_SITE_OTHER): Payer: Self-pay | Admitting: Nurse Practitioner

## 2021-12-05 DIAGNOSIS — D509 Iron deficiency anemia, unspecified: Secondary | ICD-10-CM | POA: Insufficient documentation

## 2021-12-05 DIAGNOSIS — D649 Anemia, unspecified: Secondary | ICD-10-CM

## 2021-12-05 LAB — CBC WITH DIFFERENTIAL/PLATELET
Abs Immature Granulocytes: 0.11 10*3/uL — ABNORMAL HIGH (ref 0.00–0.07)
Basophils Absolute: 0.1 10*3/uL (ref 0.0–0.1)
Basophils Relative: 1 %
Eosinophils Absolute: 0.3 10*3/uL (ref 0.0–0.5)
Eosinophils Relative: 3 %
HCT: 36.9 % (ref 36.0–46.0)
Hemoglobin: 11 g/dL — ABNORMAL LOW (ref 12.0–15.0)
Immature Granulocytes: 1 %
Lymphocytes Relative: 8 %
Lymphs Abs: 0.7 10*3/uL (ref 0.7–4.0)
MCH: 27.5 pg (ref 26.0–34.0)
MCHC: 29.8 g/dL — ABNORMAL LOW (ref 30.0–36.0)
MCV: 92.3 fL (ref 80.0–100.0)
Monocytes Absolute: 0.4 10*3/uL (ref 0.1–1.0)
Monocytes Relative: 4 %
Neutro Abs: 8.1 10*3/uL — ABNORMAL HIGH (ref 1.7–7.7)
Neutrophils Relative %: 83 %
Platelets: 448 10*3/uL — ABNORMAL HIGH (ref 150–400)
RBC: 4 MIL/uL (ref 3.87–5.11)
RDW: 18.1 % — ABNORMAL HIGH (ref 11.5–15.5)
WBC: 9.7 10*3/uL (ref 4.0–10.5)
nRBC: 0 % (ref 0.0–0.2)

## 2021-12-05 LAB — FERRITIN: Ferritin: 73 ng/mL (ref 11–307)

## 2021-12-05 LAB — IRON AND TIBC
Iron: 53 ug/dL (ref 28–170)
Saturation Ratios: 14 % (ref 10.4–31.8)
TIBC: 367 ug/dL (ref 250–450)
UIBC: 314 ug/dL

## 2021-12-06 ENCOUNTER — Encounter (HOSPITAL_COMMUNITY): Payer: Self-pay

## 2021-12-06 NOTE — Progress Notes (Signed)
Had a home visit with [name] and daughter.  Her family takes turns staying with her, she has someone 24 hrs a day.  She was ambulatory in the home this morning.  She has been to the lab this morning for blood work at [facility].  She states doing pretty good this morning.  Managing pain with tylenol today.  She denies any shortness of breath or headache.  Daughter states weight up 1 lbs today.  Denies any abdominal tightness or edema in legs.  They are aware to call with weight gain of 2 lbs. Or more.  Daughter states her sugars have been running higher around 200's.  She is finishing up an antibiotics, advised could be the cause and consult with her primary doctor.  She is almost out of tramadol for her pain, they will reach out to her doctor.  Her toe is healing but slowly.  They have everything fro daily living.  They are aware of up coming appts.  Will continue to visit for heart failure, diet and medication management.  ? ?Tod Abrahamsen ?[place] EMT-Paramedic ?[phone] ?

## 2021-12-13 ENCOUNTER — Other Ambulatory Visit (INDEPENDENT_AMBULATORY_CARE_PROVIDER_SITE_OTHER): Payer: Self-pay | Admitting: Nurse Practitioner

## 2021-12-13 ENCOUNTER — Encounter: Payer: Self-pay | Admitting: Hematology and Oncology

## 2021-12-14 ENCOUNTER — Telehealth (INDEPENDENT_AMBULATORY_CARE_PROVIDER_SITE_OTHER): Payer: Self-pay

## 2021-12-14 NOTE — Telephone Encounter (Signed)
sent 

## 2021-12-14 NOTE — Telephone Encounter (Signed)
Spoke with Pt's daughter [name] and let her know that I had sent the message to the providers and once they answer someone will call her back to let her know if the tramadol was refilled and if they would prescribe more than 4 days worth. ?

## 2021-12-14 NOTE — Telephone Encounter (Signed)
[name] Dry left a message that [name] has 1 pill left and really needs this refilled. ?

## 2021-12-14 NOTE — Telephone Encounter (Signed)
Spoke with [name] to let her know that refill was called in for 56 tablets ?

## 2021-12-22 ENCOUNTER — Ambulatory Visit: Payer: Medicare Other | Admitting: Physician Assistant

## 2021-12-26 ENCOUNTER — Ambulatory Visit (INDEPENDENT_AMBULATORY_CARE_PROVIDER_SITE_OTHER): Payer: Medicare Other | Admitting: Nurse Practitioner

## 2021-12-26 ENCOUNTER — Encounter (INDEPENDENT_AMBULATORY_CARE_PROVIDER_SITE_OTHER): Payer: Medicare Other

## 2022-01-08 ENCOUNTER — Ambulatory Visit: Payer: Medicare Other | Admitting: Family

## 2022-03-06 ENCOUNTER — Other Ambulatory Visit: Payer: Medicare Other

## 2022-03-06 ENCOUNTER — Ambulatory Visit: Payer: Medicare Other | Admitting: Oncology

## 2022-06-19 ENCOUNTER — Encounter (INDEPENDENT_AMBULATORY_CARE_PROVIDER_SITE_OTHER): Payer: Medicare Other

## 2022-06-19 ENCOUNTER — Ambulatory Visit (INDEPENDENT_AMBULATORY_CARE_PROVIDER_SITE_OTHER): Payer: Medicare Other | Admitting: Nurse Practitioner

## 2022-08-22 NOTE — Telephone Encounter (Signed)
Signing encounter, see note 10/10/20
